# Patient Record
Sex: Female | Born: 1940
Health system: Southern US, Community
[De-identification: ages and names within clinical notes are randomized; demographics above are authoritative.]

## PROBLEM LIST (undated history)

## (undated) ENCOUNTER — Emergency Department: Disposition: A | Discharge status: Home / Self Care | Payer: PPO

## (undated) DIAGNOSIS — R42 Dizziness and giddiness: Secondary | ICD-10-CM

## (undated) DIAGNOSIS — I10 Essential (primary) hypertension: Secondary | ICD-10-CM

## (undated) DIAGNOSIS — I509 Heart failure, unspecified: Secondary | ICD-10-CM

## (undated) DIAGNOSIS — K259 Gastric ulcer, unspecified as acute or chronic, without hemorrhage or perforation: Secondary | ICD-10-CM

## (undated) DIAGNOSIS — K219 Gastro-esophageal reflux disease without esophagitis: Secondary | ICD-10-CM

## (undated) DIAGNOSIS — E7800 Pure hypercholesterolemia, unspecified: Secondary | ICD-10-CM

## (undated) DIAGNOSIS — Z973 Presence of spectacles and contact lenses: Secondary | ICD-10-CM

## (undated) DIAGNOSIS — M1990 Unspecified osteoarthritis, unspecified site: Secondary | ICD-10-CM

## (undated) DIAGNOSIS — Z87442 Personal history of urinary calculi: Secondary | ICD-10-CM

## (undated) DIAGNOSIS — F419 Anxiety disorder, unspecified: Secondary | ICD-10-CM

## (undated) DIAGNOSIS — Z8489 Family history of other specified conditions: Secondary | ICD-10-CM

## (undated) DIAGNOSIS — J449 Chronic obstructive pulmonary disease, unspecified: Secondary | ICD-10-CM

## (undated) DIAGNOSIS — R519 Headache, unspecified: Secondary | ICD-10-CM

## (undated) DIAGNOSIS — I779 Disorder of arteries and arterioles, unspecified: Secondary | ICD-10-CM

## (undated) DIAGNOSIS — C801 Malignant (primary) neoplasm, unspecified: Secondary | ICD-10-CM

## (undated) DIAGNOSIS — R51 Headache: Secondary | ICD-10-CM

## (undated) DIAGNOSIS — I739 Peripheral vascular disease, unspecified: Secondary | ICD-10-CM

## (undated) DIAGNOSIS — Z720 Tobacco use: Secondary | ICD-10-CM

## (undated) DIAGNOSIS — R0602 Shortness of breath: Secondary | ICD-10-CM

## (undated) DIAGNOSIS — I639 Cerebral infarction, unspecified: Secondary | ICD-10-CM

## (undated) DIAGNOSIS — H269 Unspecified cataract: Secondary | ICD-10-CM

## (undated) DIAGNOSIS — J189 Pneumonia, unspecified organism: Secondary | ICD-10-CM

## (undated) HISTORY — DX: Peripheral vascular disease, unspecified: I73.9

## (undated) HISTORY — DX: Tobacco use: Z72.0

## (undated) HISTORY — DX: Dizziness and giddiness: R42

## (undated) HISTORY — PX: BREAST REDUCTION SURGERY: SHX8

## (undated) HISTORY — PX: APPENDECTOMY: SHX54

## (undated) HISTORY — DX: Disorder of arteries and arterioles, unspecified: I77.9

## (undated) HISTORY — PX: TUBAL LIGATION: SHX77

## (undated) HISTORY — PX: SALIVARY GLAND SURGERY: SHX768

## (undated) HISTORY — DX: Chronic obstructive pulmonary disease, unspecified: J44.9

## (undated) HISTORY — PX: ABDOMINAL HYSTERECTOMY: SHX81

---

## 2011-11-19 ENCOUNTER — Telehealth: Payer: Self-pay | Admitting: Gastroenterology

## 2011-11-19 ENCOUNTER — Emergency Department (HOSPITAL_COMMUNITY)
Admission: EM | Admit: 2011-11-19 | Discharge: 2011-11-19 | Disposition: A | Discharge status: Home / Self Care | Payer: Medicare Other | Attending: Emergency Medicine | Admitting: Emergency Medicine

## 2011-11-19 ENCOUNTER — Encounter (HOSPITAL_COMMUNITY): Payer: Self-pay | Admitting: *Deleted

## 2011-11-19 DIAGNOSIS — R5383 Other fatigue: Secondary | ICD-10-CM

## 2011-11-19 DIAGNOSIS — G8929 Other chronic pain: Secondary | ICD-10-CM

## 2011-11-19 DIAGNOSIS — F17200 Nicotine dependence, unspecified, uncomplicated: Secondary | ICD-10-CM | POA: Insufficient documentation

## 2011-11-19 DIAGNOSIS — I10 Essential (primary) hypertension: Secondary | ICD-10-CM | POA: Insufficient documentation

## 2011-11-19 DIAGNOSIS — Z9089 Acquired absence of other organs: Secondary | ICD-10-CM | POA: Insufficient documentation

## 2011-11-19 DIAGNOSIS — E7800 Pure hypercholesterolemia, unspecified: Secondary | ICD-10-CM | POA: Insufficient documentation

## 2011-11-19 DIAGNOSIS — Z79899 Other long term (current) drug therapy: Secondary | ICD-10-CM | POA: Insufficient documentation

## 2011-11-19 DIAGNOSIS — R109 Unspecified abdominal pain: Secondary | ICD-10-CM | POA: Insufficient documentation

## 2011-11-19 DIAGNOSIS — E119 Type 2 diabetes mellitus without complications: Secondary | ICD-10-CM | POA: Insufficient documentation

## 2011-11-19 DIAGNOSIS — F411 Generalized anxiety disorder: Secondary | ICD-10-CM | POA: Insufficient documentation

## 2011-11-19 HISTORY — DX: Anxiety disorder, unspecified: F41.9

## 2011-11-19 HISTORY — DX: Gastric ulcer, unspecified as acute or chronic, without hemorrhage or perforation: K25.9

## 2011-11-19 HISTORY — DX: Pure hypercholesterolemia, unspecified: E78.00

## 2011-11-19 HISTORY — DX: Essential (primary) hypertension: I10

## 2011-11-19 LAB — COMPREHENSIVE METABOLIC PANEL
BUN: 15 mg/dL (ref 6–23)
CO2: 27 mEq/L (ref 19–32)
Calcium: 9.5 mg/dL (ref 8.4–10.5)
Creatinine, Ser: 0.54 mg/dL (ref 0.50–1.10)
GFR calc Af Amer: >90 mL/min (ref >90)
GFR calc non Af Amer: >90 mL/min (ref >90)
Glucose, Bld: 93 mg/dL (ref 70–99)
Total Protein: 6.9 g/dL (ref 6.0–8.3)

## 2011-11-19 LAB — CBC WITH DIFFERENTIAL/PLATELET
Eosinophils Absolute: 0.1 10*3/uL (ref 0.0–0.7)
Eosinophils Relative: 1 % (ref 0–5)
HCT: 31 % — ABNORMAL LOW (ref 36.0–46.0)
Lymphs Abs: 2.1 10*3/uL (ref 0.7–4.0)
MCH: 31.9 pg (ref 26.0–34.0)
MCV: 94.2 fL (ref 78.0–100.0)
Monocytes Absolute: 0.9 10*3/uL (ref 0.1–1.0)
Monocytes Relative: 7 % (ref 3–12)
Platelets: 429 10*3/uL — ABNORMAL HIGH (ref 150–400)
RBC: 3.29 MIL/uL — ABNORMAL LOW (ref 3.87–5.11)

## 2011-11-19 LAB — URINALYSIS, ROUTINE W REFLEX MICROSCOPIC
Bilirubin Urine: NEGATIVE
Glucose, UA: NEGATIVE mg/dL
Hgb urine dipstick: NEGATIVE
Specific Gravity, Urine: 1.018 (ref 1.005–1.030)
Urobilinogen, UA: 0.2 mg/dL (ref 0.0–1.0)

## 2011-11-19 LAB — OCCULT BLOOD, POC DEVICE: Fecal Occult Bld: NEGATIVE

## 2011-11-19 LAB — PROTIME-INR: Prothrombin Time: 12.7 seconds (ref 11.6–15.2)

## 2011-11-19 MED ORDER — SODIUM CHLORIDE 0.9 % IV SOLN
Freq: Once | INTRAVENOUS | Status: AC
  Administered 2011-11-19: 20:00:00 via INTRAVENOUS

## 2011-11-19 NOTE — ED Notes (Signed)
Pt sent from PCP for Hgb 8.9. Went there for weakness since last Tuesday. Has been having black stools, denies diarrhea or constipation. C/o abd pain, "which is nothing new." Denies n/v. Sts she has a Hx of HTN and her BP is much lower than usual. Sent for eval of active GI bleed and low hgb. Dr. Maurene Capes, GI will see if consult is needed.

## 2011-11-19 NOTE — ED Provider Notes (Signed)
History     CSN: [id number]  Arrival date & time 11/19/11  1747   First MD Initiated Contact with Patient 11/19/11 1957      Chief Complaint  Patient presents with  . GI Bleeding  . Abdominal Pain    (Consider location/radiation/quality/duration/timing/severity/associated sxs/prior treatment) HPI Comments: Patient states, that she went to her primary care physician today for weakness.  She reports, that she's had black, tarry stools for over a month.  She does have a history of GERD, and diabetes.  She has no change in her medications or activities.  She has no history of diverticular disease.  She's had no recent illnesses, including nausea, vomiting, diarrhea, fevers, dysuria.  Her primary care physician's office.  Her hemoglobin was reported, at 8.9, so she was sent to the emergency department for further evaluation  Patient is a 71 y.o. female presenting with abdominal pain.  Abdominal Pain The primary symptoms of the illness include abdominal pain. The primary symptoms of the illness do not include fever, nausea, diarrhea or dysuria. The onset of the illness was gradual. The problem has been gradually worsening.  Symptoms associated with the illness do not include chills, constipation or back pain. Significant associated medical issues include GERD.    Past Medical History  Diagnosis Date  . Hypertension   . Hypercholesteremia   . Diabetes mellitus   . Anxiety   . Stomach ulcer     Past Surgical History  Procedure Date  . Tubal ligation   . Abdominal hysterectomy   . Breast reduction surgery   . Appendectomy     No family history on file.  History  Substance Use Topics  . Smoking status: Current Everyday Smoker  . Smokeless tobacco: Not on file  . Alcohol Use: No    OB History    Grav Para Term Preterm Abortions TAB SAB Ect Mult Living                  Review of Systems  Constitutional: Negative for fever and chills.  Gastrointestinal: Positive for  abdominal pain and blood in stool. Negative for nausea, diarrhea, constipation, abdominal distention, anal bleeding and rectal pain.  Genitourinary: Negative for dysuria and flank pain.  Musculoskeletal: Negative for back pain.  Neurological: Positive for weakness. Negative for dizziness and headaches.    Allergies  Estrace  Home Medications   Current Outpatient Rx  Name Route Sig Dispense Refill  . AMLODIPINE BESYLATE 5 MG PO TABS Oral Take 5 mg by mouth daily.    . ATORVASTATIN CALCIUM 40 MG PO TABS Oral Take 40 mg by mouth daily.    [name] DIAZEPAM 5 MG PO TABS Oral Take 5 mg by mouth every 12 (twelve) hours as needed. Anxiety    . FENOFIBRATE MICRONIZED 134 MG PO CAPS Oral Take 134 mg by mouth daily before breakfast.    . HYDROCHLOROTHIAZIDE 25 MG PO TABS Oral Take 25 mg by mouth daily.    [name] LISINOPRIL 40 MG PO TABS Oral Take 40 mg by mouth daily.    [name] METFORMIN HCL 500 MG PO TABS Oral Take 500 mg by mouth 2 (two) times daily with a meal.    . METOCLOPRAMIDE HCL 10 MG PO TABS Oral Take 10 mg by mouth 4 (four) times daily.    [name] OMEPRAZOLE 20 MG PO CPDR Oral Take 20 mg by mouth daily.      BP 111/56  Pulse 101  Temp 98.6 F (37 C) (Oral)  Resp 20  Ht 5' 4.5" (1.638 m)  Wt 155 lb (70.308 kg)  BMI 26.19 kg/m2  SpO2 99%  Physical Exam  Constitutional: She appears well-developed.  HENT:  Head: Normocephalic.  Neck: Normal range of motion.  Cardiovascular: Tachycardia present.   Pulmonary/Chest: Effort normal. No respiratory distress. She exhibits no tenderness.  Abdominal: Soft. Bowel sounds are normal. She exhibits no distension. There is no tenderness.  Musculoskeletal: Normal range of motion.  Neurological: She is alert.  Skin: Skin is warm and dry. No pallor.    ED Course  Procedures (including critical care time)  Labs Reviewed  CBC WITH DIFFERENTIAL - Abnormal; Notable for the following:    WBC 13.2 (*)     RBC 3.29 (*)     Hemoglobin 10.5 (*)     HCT 31.0 (*)       Platelets 429 (*)     Neutro Abs 10.1 (*)     All other components within normal limits  COMPREHENSIVE METABOLIC PANEL - Abnormal; Notable for the following:    Total Bilirubin 0.1 (*)     All other components within normal limits  URINALYSIS, ROUTINE W REFLEX MICROSCOPIC  PROTIME-INR  OCCULT BLOOD, POC DEVICE  VITAMIN B12  IRON AND TIBC  FERRITIN   No results found.   1. Fatigue   2. Chronic abdominal pain       MDM  Consulted with Dr. Olevia Oliver, from gl[facility]ellar.  GI review of patient's labs symptoms.  She has had additional labs be drawn to evaluate for anemia and refer patient back to her primary care physician for further evaluation of her fatigue.         Caitlin Balding, NP 11/19/11 2205

## 2011-11-19 NOTE — Telephone Encounter (Signed)
Spoke with Dr. Deatra Ina as he is doc of the day. Per Dr. Deatra Ina for active GI bleed pt needs to go to the ER. Spoke with Margarita Grizzle and she will notify the pt.

## 2011-11-19 NOTE — ED Provider Notes (Signed)
71 year old female has been having black bowel movements for the last several days. She went to see her PCP where hemoglobin was noted to be 8.9 and she was directed come to the ED. Here, hemoglobin is over 10 and she is hemodynamically stable. She does not need emergent hospitalization but does need evaluation by GI and appointments are to be made with a gastroenterologist to be evaluated tomorrow.  Delora Fuel, MD 04/59/97 [id number]

## 2011-11-20 LAB — VITAMIN B12: Vitamin B-12: 846 pg/mL (ref 211–911)

## 2011-11-20 NOTE — ED Provider Notes (Signed)
Medical screening examination/treatment/procedure(s) were performed by non-physician practitioner and as supervising physician I was immediately available for consultation/collaboration.   Delora Fuel, MD 88/91/69 [id number]

## 2012-12-03 ENCOUNTER — Encounter (INDEPENDENT_AMBULATORY_CARE_PROVIDER_SITE_OTHER): Payer: Medicare Other | Admitting: *Deleted

## 2012-12-03 DIAGNOSIS — R29898 Other symptoms and signs involving the musculoskeletal system: Secondary | ICD-10-CM

## 2012-12-03 DIAGNOSIS — M79609 Pain in unspecified limb: Secondary | ICD-10-CM

## 2012-12-03 DIAGNOSIS — R0989 Other specified symptoms and signs involving the circulatory and respiratory systems: Secondary | ICD-10-CM

## 2013-01-07 ENCOUNTER — Ambulatory Visit: Payer: Medicare Other | Admitting: Cardiovascular Disease

## 2013-01-09 ENCOUNTER — Ambulatory Visit: Payer: Medicare Other | Admitting: Cardiovascular Disease

## 2013-01-20 ENCOUNTER — Encounter: Payer: Self-pay | Admitting: Cardiovascular Disease

## 2013-01-20 ENCOUNTER — Ambulatory Visit (INDEPENDENT_AMBULATORY_CARE_PROVIDER_SITE_OTHER): Payer: Medicare Other | Admitting: Cardiovascular Disease

## 2013-01-20 VITALS — BP 138/70 | HR 96 | Resp 32 | Ht 64.5 in | Wt 161.9 lb

## 2013-01-20 DIAGNOSIS — E119 Type 2 diabetes mellitus without complications: Secondary | ICD-10-CM | POA: Insufficient documentation

## 2013-01-20 DIAGNOSIS — I10 Essential (primary) hypertension: Secondary | ICD-10-CM

## 2013-01-20 DIAGNOSIS — E1159 Type 2 diabetes mellitus with other circulatory complications: Secondary | ICD-10-CM | POA: Insufficient documentation

## 2013-01-20 DIAGNOSIS — I739 Peripheral vascular disease, unspecified: Secondary | ICD-10-CM

## 2013-01-20 DIAGNOSIS — E785 Hyperlipidemia, unspecified: Secondary | ICD-10-CM | POA: Insufficient documentation

## 2013-01-20 DIAGNOSIS — Z01818 Encounter for other preprocedural examination: Secondary | ICD-10-CM

## 2013-01-20 NOTE — Assessment & Plan Note (Signed)
Patient is a history of claudication bilaterally when walking up an incline. Assistant on for 3 years. [facility] a right ABI of 0.92 and a left of 0.79. I'm going to get velocity mapping of her lower extremities and arrange for her to undergo abdominal aortography with bifemoral runoff for potential endovascular treatment of lifestyle limiting claudication

## 2013-01-20 NOTE — Progress Notes (Signed)
01/20/2013 Caitlin Oliver   1940/05/05  [id number]  Primary Physician Caitlin Seller, MD Primary Cardiologist: Lorretta Harp MD Caitlin Oliver   HPI:  Caitlin Oliver is a 72 year old mildly overweight widowed Caucasian female no children (son died 26 years ago) referred by Caitlin Niemann  PA-C at [facility] for evaluation and treatment of lifestyle limiting claudication. Risk factors included hypertension, hyperlipidemia and type 2 diabetes. She has a 75-pack-year history of tobacco abuse smoking one and a half packs a day. There's no family history of heart disease. She's never had a heart for stroke. She does complain of dyspnea but denies chest pain. She complains of bilateral low prepublication walking up an incline it is lifestyle limiting. Dopplers performed at being a vascular specialist Job mildly decreased ABIs on the right and moderate on the left. Her right is actually more symptomatic.   Current Outpatient Prescriptions  Medication Sig Dispense Refill  . amLODipine (NORVASC) 5 MG tablet Take 5 mg by mouth daily.      [name] atorvastatin (LIPITOR) 40 MG tablet Take 40 mg by mouth daily.      . diazepam (VALIUM) 5 MG tablet Take 5 mg by mouth every 12 (twelve) hours as needed. Anxiety      . fenofibrate micronized (LOFIBRA) 134 MG capsule Take 134 mg by mouth daily before breakfast.      . hydrochlorothiazide (HYDRODIURIL) 25 MG tablet Take 25 mg by mouth daily.      [name] lisinopril (PRINIVIL,ZESTRIL) 40 MG tablet Take 40 mg by mouth daily.      . metFORMIN (GLUCOPHAGE) 500 MG tablet Take 500 mg by mouth daily.       . metoCLOPramide (REGLAN) 10 MG tablet Take 10 mg by mouth 4 (four) times daily.      [name] omeprazole (PRILOSEC) 20 MG capsule Take 20 mg by mouth daily.       No current facility-administered medications for this visit.    Allergies  Allergen Reactions  . Estrace [Estradiol] Swelling    History   Social History  . Marital  Status: Widowed    Spouse Name: N/A    Number of Children: N/A  . Years of Education: N/A   Occupational History  . Not on file.   Social History Main Topics  . Smoking status: Current Every Day Smoker -- 1.50 packs/day for 56 years    Types: Cigarettes  . Smokeless tobacco: Never Used  . Alcohol Use: No  . Drug Use: No  . Sexual Activity: Not on file   Other Topics Concern  . Not on file   Social History Narrative  . No narrative on file     Review of Systems: General: negative for chills, fever, night sweats or weight changes.  Cardiovascular: negative for chest pain, dyspnea on exertion, edema, orthopnea, palpitations, paroxysmal nocturnal dyspnea or shortness of breath Dermatological: negative for rash Respiratory: negative for cough or wheezing Urologic: negative for hematuria Abdominal: negative for nausea, vomiting, diarrhea, bright red blood per rectum, melena, or hematemesis Neurologic: negative for visual changes, syncope, or dizziness All other systems reviewed and are otherwise negative except as noted above.    Blood pressure 138/70, pulse 96, resp. rate 32, height 5' 4.5" (1.638 m), weight 161 lb 14.4 oz (73.437 kg).  General appearance: alert and no distress Neck: no adenopathy, no JVD, supple, symmetrical, trachea midline, thyroid not enlarged, symmetric, no tenderness/mass/nodules and ssoft bilateral carotid bruits Lungs: clear to auscultation  bilaterally Heart: regular rate and rhythm, S1, S2 normal, no murmur, click, rub or gallop Extremities: extremities normal, atraumatic, no cyanosis or edema and 1+ pedal pulses bilaterally  EKG sinus rhythm at 96 without ST or T wave changes  ASSESSMENT AND PLAN:   Essential hypertension Well-controlled on appropriate medications  Hyperlipidemia On statin therapy followed by her PCP  Claudication Patient is a history of claudication bilaterally when walking up an incline. Assistant on for 3 years. She had  [facility] a right ABI of 0.92 and a left of 0.79. I'm going to get velocity mapping of her lower extremities and arrange for her to undergo abdominal aortography with bifemoral runoff for potential endovascular treatment of lifestyle limiting claudication      Lorretta Harp MD [facility], [facility] 01/20/2013 2:35 PM

## 2013-01-20 NOTE — Assessment & Plan Note (Signed)
On statin therapy followed by her PCP 

## 2013-01-20 NOTE — Patient Instructions (Addendum)
Your physician has requested that you have a peripheral vascular angiogram. This exam is performed at the hospital. During this exam IV contrast is used to look at arterial blood flow. Please review the information sheet given for details.  Your physician has requested that you have a carotid duplex. This test is an ultrasound of the carotid arteries in your neck. It looks at blood flow through these arteries that supply the brain with blood. Allow one hour for this exam. There are no restrictions or special instructions.  Your physician has requested that you have a lower extremity arterial exercise duplex. During this test, exercise and ultrasound are used to evaluate arterial blood flow in the legs. Allow one hour for this exam. There are no restrictions or special instructions.  Your physician has requested that you have a lexiscan myoview. For further information please visit [email or web address]. Please follow instruction sheet, as given.  Your physician recommends that you return for lab work within 7 days of the angiogram. This date will follow the other testing.  A chest x-ray takes a picture of the organs and structures inside the chest, including the heart, lungs, and blood vessels. This test can show several things, including, whether the heart is enlarges; whether fluid is building up in the lungs; and whether pacemaker / defibrillator leads are still in place.

## 2013-01-20 NOTE — Assessment & Plan Note (Signed)
Well-controlled on appropriate medications

## 2013-01-21 ENCOUNTER — Encounter: Payer: Self-pay | Admitting: Cardiovascular Disease

## 2013-01-27 ENCOUNTER — Other Ambulatory Visit: Payer: Self-pay | Admitting: *Deleted

## 2013-01-27 DIAGNOSIS — Z01818 Encounter for other preprocedural examination: Secondary | ICD-10-CM

## 2013-01-29 ENCOUNTER — Ambulatory Visit (HOSPITAL_COMMUNITY)
Admission: RE | Admit: 2013-01-29 | Discharge: 2013-01-29 | Disposition: A | Discharge status: Home / Self Care | Payer: Medicare Other | Source: Ambulatory Visit | Attending: Internal Medicine | Admitting: Internal Medicine

## 2013-01-29 DIAGNOSIS — Z01810 Encounter for preprocedural cardiovascular examination: Secondary | ICD-10-CM | POA: Insufficient documentation

## 2013-01-29 DIAGNOSIS — Z01818 Encounter for other preprocedural examination: Secondary | ICD-10-CM | POA: Insufficient documentation

## 2013-01-29 DIAGNOSIS — I739 Peripheral vascular disease, unspecified: Secondary | ICD-10-CM

## 2013-01-29 MED ORDER — AMINOPHYLLINE 25 MG/ML IV SOLN
125.0000 mg | Freq: Once | INTRAVENOUS | Status: AC
  Administered 2013-01-29: 125 mg via INTRAVENOUS

## 2013-01-29 MED ORDER — REGADENOSON 0.4 MG/5ML IV SOLN
0.4000 mg | Freq: Once | INTRAVENOUS | Status: AC
  Administered 2013-01-29: 0.4 mg via INTRAVENOUS

## 2013-01-29 MED ORDER — TECHNETIUM TC 99M SESTAMIBI GENERIC - CARDIOLITE
30.0000 | Freq: Once | INTRAVENOUS | Status: AC | PRN
  Administered 2013-01-29: 30 via INTRAVENOUS

## 2013-01-29 MED ORDER — TECHNETIUM TC 99M SESTAMIBI GENERIC - CARDIOLITE
10.0000 | Freq: Once | INTRAVENOUS | Status: AC | PRN
  Administered 2013-01-29: 10 via INTRAVENOUS

## 2013-01-29 NOTE — Procedures (Addendum)
[facility] [address] [place] [address] [phone]  Cardiology Nuclear Med Study  Caitlin Oliver is a 72 y.o. female     MRN : [id number]     DOB: Feb 05, 1941  Procedure Date: 01/29/2013  Nuclear Med Background Indication for Stress Test:  Surgical Clearance History:  COPD Cardiac Risk Factors: Hypertension, Lipids, NIDDM, Overweight, PVD and Smoker  Symptoms:  Dizziness, DOE, Fatigue and Light-Headedness   Nuclear Pre-Procedure Caffeine/Decaff Intake:  12:00am NPO After: 10am   IV Site: R Forearm  IV 0.9% NS with Angio Cath:  22g  Chest Size (in):  n/a IV Started by: Azucena Cecil, RN  Height: 5' 4.5" (1.638 m)  Cup Size: C  BMI:  Body mass index is 27.22 kg/(m^2). Weight:  161 lb (73.029 kg)   Tech Comments:  n/a    Nuclear Med Study 1 or 2 day study: 1 day  Stress Test Type:  [place] Provider:  Hyman Hopes   Resting Radionuclide: Technetium 43mSestamibi  Resting Radionuclide Dose: 11.0 mCi   Stress Radionuclide:  Technetium 963mestamibi  Stress Radionuclide Dose: 30.3 mCi           Stress Protocol Rest HR: 114 Stress HR: 122  Rest BP: 132/93 Stress BP: 129/96  Exercise Time (min): n/a METS: n/a   Predicted Max HR: 148 bpm % Max HR: 82.43 bpm Rate Pressure Product: 16104  Dose of Adenosine (mg):  n/a Dose of Lexiscan: 0.4 mg  Dose of Atropine (mg): n/a Dose of Dobutamine: n/a mcg/kg/min (at max HR)  Stress Test Technologist: TeLeane ParaCCT Nuclear Technologist: RoOtho PerlCNMT   Rest Procedure:  Myocardial perfusion imaging was performed at rest 45 minutes following the intravenous administration of Technetium 9978mstamibi. Stress Procedure:  The patient received IV Lexiscan 0.4 mg over 15-seconds.  Technetium 52m58mtamibi injected at 30-seconds.  The patient had SOB prior to study but developed marked SOB and extremity aching; arms and legs; 125 mg IV  Aminophylline was administered with resolution of symptoms.  There were no significant changes with Lexiscan.  Quantitative spect images were obtained after a 45 minute delay.  Transient Ischemic Dilatation (Normal <1.22):  1.05 Lung/Heart Ratio (Normal <0.45):  0.26 QGS EDV:  n/aml QGS ESV: n/a ml LV Ejection Fraction: Study not gated  Rest ECG: NSR - Normal EKG  Stress ECG: No significant change from baseline ECG and There are scattered PACs.  QPS Raw Data Images:  There is interference from nuclear activity from structures below the diaphragm. This does not affect the ability to read the study. Stress Images:  Normal homogeneous uptake in all areas of the myocardium. Rest Images:  Normal homogeneous uptake in all areas of the myocardium. Subtraction (SDS):  No evidence of ischemia.  Impression Exercise Capacity:  Lexiscan with no exercise. BP Response:  Normal blood pressure response. Clinical Symptoms:  No significant symptoms noted. ECG Impression:  There are scattered PACs. Comparison with Prior Nuclear Study: No previous nuclear study performed  Overall Impression:  Normal stress nuclear study.  LV Wall Motion:  Non-gated due to ectopy.  KennPixie Casino, FACCProvidence Seward Medical Centerrd Certified in Nuclear Cardiology Attending Cardiologist [facility] [place]  01/29/2013 4:51 PM

## 2013-02-04 ENCOUNTER — Ambulatory Visit (HOSPITAL_COMMUNITY)
Admission: RE | Admit: 2013-02-04 | Discharge: 2013-02-04 | Disposition: A | Discharge status: Home / Self Care | Payer: Medicare Other | Source: Ambulatory Visit | Attending: Cardiovascular Disease | Admitting: Cardiovascular Disease

## 2013-02-04 DIAGNOSIS — I739 Peripheral vascular disease, unspecified: Secondary | ICD-10-CM | POA: Insufficient documentation

## 2013-02-04 NOTE — Progress Notes (Signed)
Lower Extremity Arterial Duplex Completed. °Brianna L Mazza,RVT °

## 2013-02-09 ENCOUNTER — Telehealth (HOSPITAL_COMMUNITY): Payer: Self-pay | Admitting: *Deleted

## 2013-02-09 NOTE — Telephone Encounter (Signed)
Pt would like results of her stress test and LEA... Please call.

## 2013-02-10 NOTE — Telephone Encounter (Signed)
Reviewed results with patient.

## 2013-02-11 ENCOUNTER — Encounter (HOSPITAL_COMMUNITY): Payer: Medicare Other

## 2013-02-12 ENCOUNTER — Other Ambulatory Visit (HOSPITAL_COMMUNITY): Payer: Self-pay | Admitting: Cardiovascular Disease

## 2013-02-12 ENCOUNTER — Ambulatory Visit (HOSPITAL_COMMUNITY)
Admission: RE | Admit: 2013-02-12 | Discharge: 2013-02-12 | Disposition: A | Discharge status: Home / Self Care | Payer: Medicare Other | Source: Ambulatory Visit | Attending: Cardiovascular Disease | Admitting: Cardiovascular Disease

## 2013-02-12 DIAGNOSIS — I10 Essential (primary) hypertension: Secondary | ICD-10-CM

## 2013-02-12 NOTE — Progress Notes (Signed)
Carotid Duplex Completed. °Brianna L Mazza,RVT °

## 2013-02-17 ENCOUNTER — Other Ambulatory Visit: Payer: Self-pay | Admitting: *Deleted

## 2013-02-17 ENCOUNTER — Encounter (HOSPITAL_COMMUNITY): Payer: Self-pay | Admitting: Pharmacy Technician

## 2013-02-17 DIAGNOSIS — Z01818 Encounter for other preprocedural examination: Secondary | ICD-10-CM

## 2013-02-18 ENCOUNTER — Ambulatory Visit
Admission: RE | Admit: 2013-02-18 | Discharge: 2013-02-18 | Disposition: A | Discharge status: Home / Self Care | Payer: Medicare Other | Source: Ambulatory Visit | Attending: Cardiovascular Disease | Admitting: Cardiovascular Disease

## 2013-02-18 DIAGNOSIS — Z01818 Encounter for other preprocedural examination: Secondary | ICD-10-CM

## 2013-02-18 LAB — COMPREHENSIVE METABOLIC PANEL
ALT: 14 U/L (ref 0–35)
Albumin: 4.4 g/dL (ref 3.5–5.2)
Alkaline Phosphatase: 85 U/L (ref 39–117)
CO2: 31 mEq/L (ref 19–32)
Glucose, Bld: 104 mg/dL — ABNORMAL HIGH (ref 70–99)
Potassium: 4.3 mEq/L (ref 3.5–5.3)
Sodium: 135 mEq/L (ref 135–145)
Total Bilirubin: 0.3 mg/dL (ref 0.3–1.2)
Total Protein: 6.9 g/dL (ref 6.0–8.3)

## 2013-02-18 LAB — CBC
Hemoglobin: 17.2 g/dL — ABNORMAL HIGH (ref 12.0–15.0)
MCH: 32 pg (ref 26.0–34.0)
MCHC: 35.2 g/dL (ref 30.0–36.0)
Platelets: 375 10*3/uL (ref 150–400)
RBC: 5.37 MIL/uL — ABNORMAL HIGH (ref 3.87–5.11)

## 2013-02-19 LAB — APTT: aPTT: 31 seconds (ref 24–37)

## 2013-02-19 LAB — PROTIME-INR
INR: 0.93 (ref <1.50)
Prothrombin Time: 12.5 seconds (ref 11.6–15.2)

## 2013-02-22 ENCOUNTER — Encounter: Payer: Self-pay | Admitting: *Deleted

## 2013-02-24 ENCOUNTER — Other Ambulatory Visit: Payer: Self-pay | Admitting: Cardiovascular Disease

## 2013-02-25 ENCOUNTER — Encounter (HOSPITAL_COMMUNITY)
Admission: RE | Disposition: A | Discharge status: Home / Self Care | Payer: Self-pay | Source: Ambulatory Visit | Attending: Cardiovascular Disease

## 2013-02-25 ENCOUNTER — Ambulatory Visit (HOSPITAL_COMMUNITY)
Admission: RE | Admit: 2013-02-25 | Discharge: 2013-02-25 | Disposition: A | Discharge status: Home / Self Care | Payer: Medicare Other | Source: Ambulatory Visit | Attending: Cardiovascular Disease | Admitting: Cardiovascular Disease

## 2013-02-25 DIAGNOSIS — I658 Occlusion and stenosis of other precerebral arteries: Secondary | ICD-10-CM | POA: Insufficient documentation

## 2013-02-25 DIAGNOSIS — I152 Hypertension secondary to endocrine disorders: Secondary | ICD-10-CM | POA: Diagnosis present

## 2013-02-25 DIAGNOSIS — E785 Hyperlipidemia, unspecified: Secondary | ICD-10-CM | POA: Insufficient documentation

## 2013-02-25 DIAGNOSIS — I739 Peripheral vascular disease, unspecified: Secondary | ICD-10-CM | POA: Diagnosis present

## 2013-02-25 DIAGNOSIS — F17200 Nicotine dependence, unspecified, uncomplicated: Secondary | ICD-10-CM | POA: Insufficient documentation

## 2013-02-25 DIAGNOSIS — I708 Atherosclerosis of other arteries: Secondary | ICD-10-CM | POA: Insufficient documentation

## 2013-02-25 DIAGNOSIS — I70219 Atherosclerosis of native arteries of extremities with intermittent claudication, unspecified extremity: Secondary | ICD-10-CM | POA: Insufficient documentation

## 2013-02-25 DIAGNOSIS — I6529 Occlusion and stenosis of unspecified carotid artery: Secondary | ICD-10-CM | POA: Insufficient documentation

## 2013-02-25 DIAGNOSIS — E7800 Pure hypercholesterolemia, unspecified: Secondary | ICD-10-CM | POA: Insufficient documentation

## 2013-02-25 DIAGNOSIS — Z7982 Long term (current) use of aspirin: Secondary | ICD-10-CM | POA: Insufficient documentation

## 2013-02-25 DIAGNOSIS — I10 Essential (primary) hypertension: Secondary | ICD-10-CM | POA: Insufficient documentation

## 2013-02-25 DIAGNOSIS — E1159 Type 2 diabetes mellitus with other circulatory complications: Secondary | ICD-10-CM | POA: Diagnosis present

## 2013-02-25 DIAGNOSIS — E119 Type 2 diabetes mellitus without complications: Secondary | ICD-10-CM | POA: Diagnosis present

## 2013-02-25 DIAGNOSIS — Z01818 Encounter for other preprocedural examination: Secondary | ICD-10-CM

## 2013-02-25 DIAGNOSIS — E663 Overweight: Secondary | ICD-10-CM | POA: Insufficient documentation

## 2013-02-25 DIAGNOSIS — E1169 Type 2 diabetes mellitus with other specified complication: Secondary | ICD-10-CM | POA: Diagnosis present

## 2013-02-25 HISTORY — PX: CAROTID ANGIOGRAM: SHX5504

## 2013-02-25 HISTORY — PX: LOWER EXTREMITY ANGIOGRAM: SHX5508

## 2013-02-25 LAB — GLUCOSE, CAPILLARY
Glucose-Capillary: 122 mg/dL — ABNORMAL HIGH (ref 70–99)
Glucose-Capillary: 108 mg/dL — ABNORMAL HIGH (ref 70–99)

## 2013-02-25 SURGERY — ANGIOGRAM, LOWER EXTREMITY
Anesthesia: LOCAL

## 2013-02-25 MED ORDER — SODIUM CHLORIDE 0.9 % IV SOLN: INTRAVENOUS | Status: AC

## 2013-02-25 MED ORDER — HEPARIN (PORCINE) IN NACL 2-0.9 UNIT/ML-% IJ SOLN
INTRAMUSCULAR | Status: AC
  Filled 2013-02-25: qty 1000

## 2013-02-25 MED ORDER — SODIUM CHLORIDE 0.9 % IJ SOLN: 3.0000 mL | INTRAMUSCULAR | Status: DC | PRN

## 2013-02-25 MED ORDER — ASPIRIN 81 MG PO CHEW
81.0000 mg | CHEWABLE_TABLET | ORAL | Status: AC
  Administered 2013-02-25: 81 mg via ORAL
  Filled 2013-02-25: qty 1

## 2013-02-25 MED ORDER — SODIUM CHLORIDE 0.9 % IV SOLN
INTRAVENOUS | Status: DC
  Administered 2013-02-25: 1000 mL via INTRAVENOUS

## 2013-02-25 MED ORDER — ONDANSETRON HCL 4 MG/2ML IJ SOLN: 4.0000 mg | Freq: Four times a day (QID) | INTRAMUSCULAR | Status: DC | PRN

## 2013-02-25 MED ORDER — GUAIFENESIN-CODEINE 100-10 MG/5ML PO SOLN
ORAL | Status: AC
  Filled 2013-02-25: qty 5

## 2013-02-25 MED ORDER — ACETAMINOPHEN 325 MG PO TABS: 650.0000 mg | ORAL_TABLET | ORAL | Status: DC | PRN

## 2013-02-25 MED ORDER — LIDOCAINE HCL (PF) 1 % IJ SOLN
INTRAMUSCULAR | Status: AC
  Filled 2013-02-25: qty 30

## 2013-02-25 NOTE — H&P (Signed)
Chief Complaint: Claudication  HPI:  [name] is a 72 year old mildly overweight widowed Caucasian female no children (son died 4 years ago) referred by Emmie Niemann PA-C at [facility] for evaluation and treatment of lifestyle limiting claudication. Risk factors included hypertension, hyperlipidemia and type 2 diabetes. She has a 75-pack-year history of tobacco abuse smoking one and a half packs a day. There's no family history of heart disease. She's never had a heart for stroke. She does complain of dyspnea but denies chest pain. She complains of bilateral low prepublication walking up an incline it is lifestyle limiting. Dopplers performed at being a vascular specialist Job mildly decreased ABIs on the right and moderate on the left. Her right is actually more symptomatic.  Past Medical History  Diagnosis Date  . Hypertension   . Hypercholesteremia   . Diabetes mellitus   . Anxiety   . Stomach ulcer   . Claudication   . Tobacco abuse     Past Surgical History  Procedure Laterality Date  . Tubal ligation    . Abdominal hysterectomy    . Breast reduction surgery    . Appendectomy      Family History  Problem Relation Age of Onset  . Stroke Mother   . Hypertension Mother   . Kidney failure Father    Social History:  reports that she has been smoking Cigarettes.  She has a 84 pack-year smoking history. She has never used smokeless tobacco. She reports that she does not drink alcohol or use illicit drugs.  Allergies:  Allergies  Allergen Reactions  . Estrace [Estradiol] Swelling    Medications Prior to Admission  Medication Sig Dispense Refill  . amLODipine (NORVASC) 5 MG tablet Take 5 mg by mouth daily.      [name] aspirin EC 81 MG tablet Take 81 mg by mouth daily.      . diazepam (VALIUM) 5 MG tablet Take 5 mg by mouth every 12 (twelve) hours as needed. Anxiety      . fenofibrate micronized (LOFIBRA) 134 MG capsule Take 134 mg by mouth  daily before breakfast.      . fish oil-omega-3 fatty acids 1000 MG capsule Take 1 g by mouth daily.      . hydrochlorothiazide (MICROZIDE) 12.5 MG capsule Take 12.5 mg by mouth daily.      [name] lisinopril (PRINIVIL,ZESTRIL) 20 MG tablet Take 20 mg by mouth daily.      . metFORMIN (GLUCOPHAGE) 500 MG tablet Take 500 mg by mouth daily.       . metoCLOPramide (REGLAN) 10 MG tablet Take 10 mg by mouth 4 (four) times daily.      [name] omeprazole (PRILOSEC) 20 MG capsule Take 20 mg by mouth 2 (two) times daily before a meal.       . pravastatin (PRAVACHOL) 40 MG tablet Take 40 mg by mouth daily.        Results for orders placed during the hospital encounter of 02/25/13 (from the past 48 hour(s))  GLUCOSE, CAPILLARY     Status: Abnormal   Collection Time    02/25/13  7:43 AM      Result Value Range   Glucose-Capillary 122 (*) 70 - 99 mg/dL   No results found.  Review of Systems  Cardiovascular: Positive for claudication. Negative for chest pain.  All other systems reviewed and are negative.    Pulse 106, temperature 98.4 F (36.9 C), temperature source Oral, resp. rate 20,  height 5' 4.5" (1.638 m), weight 161 lb (73.029 kg), SpO2 100.00%. Physical Exam  Constitutional: She is oriented to person, place, and time. She appears well-developed and well-nourished. No distress.  Cardiovascular: Normal rate, regular rhythm, normal heart sounds and intact distal pulses.   Pulses:      Radial pulses are 2+ on the right side, and 2+ on the left side.       Dorsalis pedis pulses are 0 on the right side, and 1+ on the left side.  Respiratory: Effort normal and breath sounds normal. No respiratory distress. She has no wheezes. She has no rales.  Musculoskeletal: She exhibits no edema.  Neurological: She is alert and oriented to person, place, and time.  Skin: Skin is warm and dry. She is not diaphoretic.  Psychiatric: She has a normal mood and affect. Her behavior is normal.      Assessment/Plan Principal Problem:   Claudication Active Problems:   Essential hypertension   Type 2 diabetes mellitus   Hyperlipidemia  Plan: Plan for PV [place] with Dr. Gwenlyn Found.   Lyda Jester 02/25/2013, 9:56 AM   Agree with note written by Ellen Henri  [facility]  For PV angio. No change in H & P  Courtney Bellizzi J 02/25/2013 10:33 AM

## 2013-02-25 NOTE — Progress Notes (Signed)
Pt voided X .3. Discharge instruction given per MD order.  Pt and CG able to verbalize understanding.  Pt to car via wheelchair.  Pt denies any discomfort at this time.

## 2013-02-25 NOTE — Progress Notes (Signed)
Pt received form cath procedure alert and able tolerate food and fluid.  Pt denise any discomfort at this time.

## 2013-02-25 NOTE — Interval H&P Note (Signed)
History and Physical Interval Note:  02/25/2013 10:33 AM  Caitlin Oliver  has presented today for surgery, with the diagnosis of Claudication  The various methods of treatment have been discussed with the patient and family. After consideration of risks, benefits and other options for treatment, the patient has consented to  Procedure(s): LOWER EXTREMITY ANGIOGRAM (N/A) CAROTID ANGIOGRAM (N/A) as a surgical intervention .  The patient's history has been reviewed, patient examined, no change in status, stable for surgery.  I have reviewed the patient's chart and labs.  Questions were answered to the patient's satisfaction.     Lorretta Harp

## 2013-02-25 NOTE — CV Procedure (Signed)
Caitlin Oliver is a 72 y.o. female    [id number] LOCATION:  FACILITY: [facility]  PHYSICIAN: Quay Burow, M.D. 11-30-1940   DATE OF PROCEDURE:  02/25/2013  DATE OF DISCHARGE:     PV Angiogram/Intervention    History obtained from chart review.Caitlin Oliver is a 72 year old mildly overweight widowed Caucasian female no children (son died 104 years ago) referred by Emmie Niemann PA-C at [facility] for evaluation and treatment of lifestyle limiting claudication. Risk factors included hypertension, hyperlipidemia and type 2 diabetes. She has a 75-pack-year history of tobacco abuse smoking one and a half packs a day. There's no family history of heart disease. She's never had a heart for stroke. She does complain of dyspnea but denies chest pain. She complains of bilateral low prepublication walking up an incline it is lifestyle limiting. Dopplers performed at being a vascular specialist Job mildly decreased ABIs on the right and moderate on the left. Her right is actually more symptomatic.    PROCEDURE DESCRIPTION:   The patient was brought to the second floor [facility] cath lab in the postabsorptive state. She was not premedicated . Her right groinwas prepped and shaved in usual sterile fashion. Xylocaine 1% was used  for local anesthesia. A 5 French sheath was inserted into the right common femoral artery using standard Seldinger technique. A 5 French pigtail catheter was used for aortic arch angiography, distal abdominal aortography and bifemoral runoff using bolus chase digital subtraction step table technique Visipaque dye was used for the entirety of the case. Retrograde aortic pressure was monitored during the case.there was a gradient measured of 70 mm mercury across the right common iliac artery. Total contrast administered to the patient was 226 cc.  HEMODYNAMICS:    AO SYSTOLIC/AO DIASTOLIC: 629/52   Angiographic Data:   1: Arch aortogram-type  one arch with fluoroscopic calcification  2: Right carotid artery-95-99% calcified eccentric proximal right internal carotid artery stenosis  3: Left carotid artery-95-99% calcified proximal left internal carotid artery stenosis  4: Midstream abdominal aortogram-the renal arteries are widely patent. The infrarenal abdominal aorta was free of significant atherosclerotic changes  5: Left lower extremity-95% ostial/proximal calcified left common iliac artery stenosis with three-vessel runoff below the knee  6: Right lower extremity-95% calcified ostial right common iliac artery stenosis, 90% eccentric right common femoral artery stenosis with three-vessel runoff below the knee  IMPRESSION:Caitlin Oliver has high-grade bilateral internal carotid artery stenosis which will need surgical revascularization. I reviewed her in transfer Dr. Annamarie Major , who agrees and will see her in the office for evaluation and consultation.she has a negative Myoview stress test and is cleared for her vascular surgical procedure at low cardiovascular risk. She also has calcified ostial bilateral iliac artery stenosis best treated with diamondback orbital rotational atherectomy, PTA and stenting. She has a high-grade right common femoral artery calcified lesion as well. The sheath was removed and pressure was held on the groin to achieve hemostasis. The patient left the Cath Lab in stable condition. She will be hydrated, discharged home and will see me back in the office.     Lorretta Harp MD, [facility] 02/25/2013 11:24 AM

## 2013-02-26 ENCOUNTER — Telehealth: Payer: Self-pay | Admitting: Surgery

## 2013-02-26 NOTE — Telephone Encounter (Addendum)
Message copied by Gena Fray on Thu Feb 26, 2013  2:13 PM ------      Message from: Alfonso Patten      Created: Wed Feb 25, 2013 11:31 AM       Dr Ardath Sax would like to see this pt for Bil ICA stenosis per Dr Gwenlyn Found either this coming Monday 11/17 or the next 11/24.      Thanks      Bethena Roys ------  02/26/13: spoke with pt, dpm

## 2013-02-27 ENCOUNTER — Ambulatory Visit (INDEPENDENT_AMBULATORY_CARE_PROVIDER_SITE_OTHER): Payer: Medicare Other | Admitting: Cardiovascular Disease

## 2013-02-27 ENCOUNTER — Encounter: Payer: Self-pay | Admitting: Surgery

## 2013-02-27 ENCOUNTER — Encounter: Payer: Self-pay | Admitting: Cardiovascular Disease

## 2013-02-27 VITALS — BP 150/64 | HR 96 | Ht 64.5 in | Wt 165.3 lb

## 2013-02-27 DIAGNOSIS — F17200 Nicotine dependence, unspecified, uncomplicated: Secondary | ICD-10-CM | POA: Insufficient documentation

## 2013-02-27 DIAGNOSIS — Z720 Tobacco use: Secondary | ICD-10-CM | POA: Insufficient documentation

## 2013-02-27 DIAGNOSIS — E785 Hyperlipidemia, unspecified: Secondary | ICD-10-CM

## 2013-02-27 DIAGNOSIS — I739 Peripheral vascular disease, unspecified: Secondary | ICD-10-CM

## 2013-02-27 DIAGNOSIS — I10 Essential (primary) hypertension: Secondary | ICD-10-CM

## 2013-02-27 DIAGNOSIS — I779 Disorder of arteries and arterioles, unspecified: Secondary | ICD-10-CM | POA: Insufficient documentation

## 2013-02-27 NOTE — Assessment & Plan Note (Signed)
Well-controlled on current medications 

## 2013-02-27 NOTE — Assessment & Plan Note (Signed)
Caitlin Oliver had carotid angiography performed by myself 02/25/13 revealed billing 95-99% bilateral internal carotid artery stenosis. She is neurologically asymptomatic. She is on aspirin. I have referred her to Dr. Annamarie Major him for consideration of staged carotid endarterectomy. He is already reviewed the angiograms.

## 2013-02-27 NOTE — Assessment & Plan Note (Signed)
Patient underwent angiography 02/25/13 revealing high-grade bilateral calcified ostial iliac disease as well as right common femoral artery stenosis. I believe she can be successfully percutaneously revascularized using diamondback rotational atherectomy and stenting. We will defer doing this until after her carotids are surgically revascularized.

## 2013-02-27 NOTE — Patient Instructions (Signed)
Your physician wants you to follow-up in: 2 months with Dr Gwenlyn Found. You will receive a reminder letter in the mail two months in advance. If you don't receive a letter, please call our office to schedule the follow-up appointment.

## 2013-02-27 NOTE — Assessment & Plan Note (Signed)
On statin therapy

## 2013-02-27 NOTE — Progress Notes (Signed)
02/27/2013 Caitlin Oliver   1940-11-04  [id number]  Primary Physician Woody Seller, MD Primary Cardiologist: Lorretta Harp MD [name]   HPI:  [name] is a 72 year old mildly overweight widowed Caucasian female no children (son died 43 years ago) referred by Caitlin Niemann PA-C at [facility] for evaluation and treatment of lifestyle limiting claudication. Risk factors included hypertension, hyperlipidemia and type 2 diabetes. She has a 75-pack-year history of tobacco abuse smoking one and a half packs a day. There's no family history of heart disease. She's never had a heart for stroke. She does complain of dyspnea but denies chest pain. She complains of bilateral lower extremity claudication when walking up an incline which is lifestyle limiting. Dopplers performed showed mildly decreased ABIs with high-frequency signals in both iliac arteries. She also has high-grade carotid disease by duplex ultrasound. I performed angiography her carotids and lower extremities revealing high-grade bilateral ICA stenosis which will need surgical revascularization as well as high grade bilateral iliac disease   Current Outpatient Prescriptions  Medication Sig Dispense Refill  . amLODipine (NORVASC) 5 MG tablet Take 5 mg by mouth daily.      . diazepam (VALIUM) 5 MG tablet Take 5 mg by mouth every 12 (twelve) hours as needed. Anxiety      . fenofibrate micronized (LOFIBRA) 134 MG capsule Take 134 mg by mouth daily before breakfast.      . fish oil-omega-3 fatty acids 1000 MG capsule Take 1 g by mouth daily.      . hydrochlorothiazide (MICROZIDE) 12.5 MG capsule Take 12.5 mg by mouth daily.      [name] lisinopril (PRINIVIL,ZESTRIL) 20 MG tablet Take 20 mg by mouth daily.      . metFORMIN (GLUCOPHAGE) 500 MG tablet Take 500 mg by mouth daily.       . metoCLOPramide (REGLAN) 10 MG tablet Take 10 mg by mouth 2 (two) times daily.       [name] omeprazole (PRILOSEC)  20 MG capsule Take 20 mg by mouth 2 (two) times daily before a meal.       . pravastatin (PRAVACHOL) 40 MG tablet Take 40 mg by mouth daily.      [name] aspirin EC 81 MG tablet Take 81 mg by mouth daily.       No current facility-administered medications for this visit.    Allergies  Allergen Reactions  . Estrace [Estradiol] Swelling    History   Social History  . Marital Status: Widowed    Spouse Name: N/A    Number of Children: N/A  . Years of Education: N/A   Occupational History  . Not on file.   Social History Main Topics  . Smoking status: Current Every Day Smoker -- 1.50 packs/day for 56 years    Types: Cigarettes  . Smokeless tobacco: Never Used  . Alcohol Use: No  . Drug Use: No  . Sexual Activity: Not on file   Other Topics Concern  . Not on file   Social History Narrative  . No narrative on file     Review of Systems: General: negative for chills, fever, night sweats or weight changes.  Cardiovascular: negative for chest pain, dyspnea on exertion, edema, orthopnea, palpitations, paroxysmal nocturnal dyspnea or shortness of breath Dermatological: negative for rash Respiratory: negative for cough or wheezing Urologic: negative for hematuria Abdominal: negative for nausea, vomiting, diarrhea, bright red blood per rectum, melena, or hematemesis Neurologic: negative for visual changes, syncope,  or dizziness All other systems reviewed and are otherwise negative except as noted above.    Blood pressure 150/64, pulse 96, height 5' 4.5" (1.638 m), weight 165 lb 4.8 oz (74.98 kg).  General appearance: alert and no distress Neck: no adenopathy, no JVD, supple, symmetrical, trachea midline, thyroid not enlarged, symmetric, no tenderness/mass/nodules and bilateral carotid bruits Lungs: clear to auscultation bilaterally Heart: regular rate and rhythm, S1, S2 normal, no murmur, click, rub or gallop Extremities: extremities normal, atraumatic, no cyanosis or edema and  rright femoral artery puncture site was well-healed  EKG performed today  ASSESSMENT AND PLAN:   Claudication Patient underwent angiography 02/25/13 revealing high-grade bilateral calcified ostial iliac disease as well as right common femoral artery stenosis. I believe she can be successfully percutaneously revascularized using diamondback rotational atherectomy and stenting. We will defer doing this until after her carotids are surgically revascularized.  Carotid artery disease [name] had carotid angiography performed by myself 02/25/13 revealed billing 95-99% bilateral internal carotid artery stenosis. She is neurologically asymptomatic. She is on aspirin. I have referred her to Dr. Annamarie Major him for consideration of staged carotid endarterectomy. He is already reviewed the angiograms.  Essential hypertension Well-controlled on current medications  Hyperlipidemia On statin therapy      Lorretta Harp MD [facility] 02/27/2013 2:26 PM

## 2013-03-02 ENCOUNTER — Ambulatory Visit (INDEPENDENT_AMBULATORY_CARE_PROVIDER_SITE_OTHER): Payer: Medicare Other | Admitting: Surgery

## 2013-03-02 ENCOUNTER — Other Ambulatory Visit (HOSPITAL_COMMUNITY): Payer: Self-pay | Admitting: *Deleted

## 2013-03-02 ENCOUNTER — Other Ambulatory Visit: Payer: Self-pay

## 2013-03-02 ENCOUNTER — Encounter: Payer: Self-pay | Admitting: Surgery

## 2013-03-02 DIAGNOSIS — I6529 Occlusion and stenosis of unspecified carotid artery: Secondary | ICD-10-CM | POA: Insufficient documentation

## 2013-03-02 NOTE — Progress Notes (Signed)
Vascular and Vein Specialist of [place]   Patient name: Caitlin Oliver MRN: [id number] DOB: 07/05/1940 Sex: female   Referred by: Dr. Gwenlyn Found  Reason for referral:  Chief Complaint  Patient presents with  . Carotid    bilateral ICA stenosis - studies at Dr. Kennon Holter office    HISTORY OF PRESENT ILLNESS: This is a very pleasant 72 year old female referred to me by Dr. Gwenlyn Found for evaluation of carotid stenosis, bilateral.  The patient was identified with a carotid bruit.  Ultrasound revealed greater than 70% bilateral carotid stenosis.  This was further confirmed with angiography.  Both sides were greater than 85%.  The patient remained asymptomatic.  Specifically, she denies numbness or weakness in either extremity.  She denies slurred speech.  She denies amaurosis fugax.  The patient suffers from diabetes.  She states that her blood sugars usually run of 80-90 range.  She does also have diabetic complications of gastroparesis for which she takes Reglan.  Her hypercholesterolemia is managed with a statin.  Her hypertension is managed with an ACE inhibitor.  She does have peripheral vascular disease, however this is been put on hold until her carotid issues have resolved.  She is a smoker but is trying to quit.  She suffers from chronic bronchitis secondary to her smoking.  Past Medical History  Diagnosis Date  . Hypertension   . Hypercholesteremia   . Diabetes mellitus   . Anxiety   . Stomach ulcer   . Claudication   . Tobacco abuse   . Carotid artery disease   . Peripheral arterial disease     bilateral iliac artery stenosis by angiography  . COPD (chronic obstructive pulmonary disease)     Past Surgical History  Procedure Laterality Date  . Tubal ligation    . Abdominal hysterectomy    . Breast reduction surgery    . Appendectomy      History   Social History  . Marital Status: Widowed    Spouse Name: N/A    Number of Children: N/A  . Years of Education: N/A    Occupational History  . Not on file.   Social History Main Topics  . Smoking status: Former Smoker -- 1.50 packs/day for 56 years    Types: Cigarettes  . Smokeless tobacco: Former Systems developer    Quit date: 02/25/2013  . Alcohol Use: No  . Drug Use: No  . Sexual Activity: Not on file   Other Topics Concern  . Not on file   Social History Narrative  . No narrative on file    Family History  Problem Relation Age of Onset  . Stroke Mother   . Hypertension Mother   . Kidney failure Father     Allergies as of 03/02/2013 - Review Complete 03/02/2013  Allergen Reaction Noted  . Estrace [estradiol] Swelling 11/19/2011    Current Outpatient Prescriptions on File Prior to Visit  Medication Sig Dispense Refill  . amLODipine (NORVASC) 5 MG tablet Take 5 mg by mouth daily.      [name] aspirin EC 81 MG tablet Take 81 mg by mouth daily.      . diazepam (VALIUM) 5 MG tablet Take 5 mg by mouth every 12 (twelve) hours as needed. Anxiety      . fenofibrate micronized (LOFIBRA) 134 MG capsule Take 134 mg by mouth daily before breakfast.      . fish oil-omega-3 fatty acids 1000 MG capsule Take 1 g by mouth daily.      [name]  hydrochlorothiazide (MICROZIDE) 12.5 MG capsule Take 12.5 mg by mouth daily.      [name] lisinopril (PRINIVIL,ZESTRIL) 20 MG tablet Take 20 mg by mouth daily.      . metFORMIN (GLUCOPHAGE) 500 MG tablet Take 500 mg by mouth daily.       . metoCLOPramide (REGLAN) 10 MG tablet Take 10 mg by mouth 2 (two) times daily.       [name] omeprazole (PRILOSEC) 20 MG capsule Take 20 mg by mouth 2 (two) times daily before a meal.       . pravastatin (PRAVACHOL) 40 MG tablet Take 40 mg by mouth daily.       No current facility-administered medications on file prior to visit.     REVIEW OF SYSTEMS: Cardiovascular: No chest pain, chest pressure, palpitations, orthopnea, or dyspnea on exertion.  Positive for pain in legs and walking No history of DVT or phlebitis. Pulmonary: Shortness of breath with  exertion. Neurologic: No weakness, paresthesias, aphasia, or amaurosis. No dizziness. Hematologic: No bleeding problems or clotting disorders. Musculoskeletal: No joint pain or joint swelling. Gastrointestinal: No blood in stool or hematemesis Genitourinary: No dysuria or hematuria. Psychiatric:: No history of major depression. Integumentary: No rashes or ulcers. Constitutional: No fever or chills.  PHYSICAL EXAMINATION: General: The patient appears their stated age.  Vital signs are BP 137/65  Pulse 78  Ht 5' 4.5" (1.638 m)  Wt 164 lb 6.4 oz (74.571 kg)  BMI 27.79 kg/m2  SpO2 100% HEENT:  No gross abnormalities Pulmonary: Respirations are non-labored Abdomen: Soft and non-tender  Musculoskeletal: There are no major deformities.   Neurologic: No focal weakness or paresthesias are detected, Skin: There are no ulcer or rashes noted. Psychiatric: The patient has normal affect. Cardiovascular: There is a regular rate and rhythm without significant murmur appreciated. Bilateral carotid bruits Diagnostic Studies: I have reviewed the outside ultrasound as well as angiography which reveals bilateral greater than 80% carotid stenosis, right greater than left.    Assessment:  Bilateral carotid stenosis, right greater than left Plan: We discussed medical management vs. carotid endarterectomy vs. stenting with the patient.  After well-informed discussion, we have elected to proceed with right carotid endarterectomy, with intentions of treating the left side once she recovers from the right.  We discussed the risks and benefits of the operation which include but are not limited due to risk of stroke, nerve injury, crit, complications, and bleeding.  All her questions were answered.  Her operation scheduled for this Friday, November 21     V. Leia Alf, M.D. [facility] of [place] Office: [phone] Pager:  [phone]

## 2013-03-02 NOTE — Pre-Procedure Instructions (Signed)
Caitlin Oliver  03/02/2013   Your procedure is scheduled on:  Friday, March 06, 2013 at 7:30 AM.   Report to [facility] Entrance "A" at 5:30 AM.   Call this number if you have problems the morning of surgery: [phone]   Remember:   Do not eat food or drink liquids after midnight Thursday, 03/05/13.   Take these medicines the morning of surgery with A SIP OF WATER: amLODipine (NORVASC), omeprazole (PRILOSEC), metoCLOPramide (REGLAN), diazepam (VALIUM) - if needed.  Stop all Vitamins, Herbal Medications, Fish Oil as of today, 03/03/13. Do NOT take any diabetic medications the morning of your surgery              Do not wear jewelry, make-up or nail polish.  Do not wear lotions, powders, or perfumes. You may wear deodorant.  Do not shave 48 hours prior to surgery.  Do not bring valuables to the hospital.  [facility] is not responsible for any belongings or valuables.               Contacts, dentures or bridgework may not be worn into surgery.  Leave suitcase in the car. After surgery it may be brought to your room.  For patients admitted to the hospital, discharge time is determined by your treatment team.              Special Instructions: Shower using CHG 2 nights before surgery and the night before surgery.  If you shower the day of surgery use CHG.  Use special wash - you have one bottle of CHG for all showers.  You should use approximately 1/3 of the bottle for each shower.   Please read over the following fact sheets that you were given: Pain Booklet, Coughing and Deep Breathing, Blood Transfusion Information, MRSA Information and Surgical Site Infection Prevention

## 2013-03-03 ENCOUNTER — Encounter (HOSPITAL_COMMUNITY): Payer: Self-pay

## 2013-03-03 ENCOUNTER — Encounter (HOSPITAL_COMMUNITY)
Admission: RE | Admit: 2013-03-03 | Discharge: 2013-03-03 | Disposition: A | Discharge status: Home / Self Care | Payer: Medicare Other | Source: Ambulatory Visit | Attending: Surgery | Admitting: Surgery

## 2013-03-03 HISTORY — DX: Shortness of breath: R06.02

## 2013-03-03 HISTORY — DX: Gastro-esophageal reflux disease without esophagitis: K21.9

## 2013-03-03 LAB — CBC
Hemoglobin: 16.2 g/dL — ABNORMAL HIGH (ref 12.0–15.0)
MCH: 30.7 pg (ref 26.0–34.0)
MCHC: 33.6 g/dL (ref 30.0–36.0)
MCV: 91.3 fL (ref 78.0–100.0)
RDW: 12.2 % (ref 11.5–15.5)

## 2013-03-03 LAB — URINALYSIS, ROUTINE W REFLEX MICROSCOPIC
Bilirubin Urine: NEGATIVE
Hgb urine dipstick: NEGATIVE
Leukocytes, UA: NEGATIVE
Nitrite: NEGATIVE
Protein, ur: NEGATIVE mg/dL
Urobilinogen, UA: 0.2 mg/dL (ref 0.0–1.0)

## 2013-03-03 LAB — COMPREHENSIVE METABOLIC PANEL
ALT: 15 U/L (ref 0–35)
Albumin: 4.2 g/dL (ref 3.5–5.2)
BUN: 12 mg/dL (ref 6–23)
Calcium: 9.9 mg/dL (ref 8.4–10.5)
Creatinine, Ser: 0.64 mg/dL (ref 0.50–1.10)
GFR calc Af Amer: >90 mL/min (ref >90)
Glucose, Bld: 103 mg/dL — ABNORMAL HIGH (ref 70–99)
Potassium: 4.6 mEq/L (ref 3.5–5.1)
Sodium: 132 mEq/L — ABNORMAL LOW (ref 135–145)
Total Protein: 7.5 g/dL (ref 6.0–8.3)

## 2013-03-03 LAB — ABO/RH: ABO/RH(D): A POS

## 2013-03-03 LAB — TYPE AND SCREEN
ABO/RH(D): A POS
Antibody Screen: NEGATIVE

## 2013-03-03 LAB — PROTIME-INR: Prothrombin Time: 12.2 seconds (ref 11.6–15.2)

## 2013-03-03 LAB — APTT: aPTT: 32 seconds (ref 24–37)

## 2013-03-03 LAB — SURGICAL PCR SCREEN: Staphylococcus aureus: NEGATIVE

## 2013-03-03 NOTE — Progress Notes (Signed)
Patient had a stress test on 01/29/13. Encounter in [place]. Patient denied having a cardiac cath or sleep study. PCP is Dr. Kathryne Eriksson at [facility] in [place]. Cardiologist is Dr. Quay Burow. Patient denied having any acute cardiac or pulmonary issues.

## 2013-03-05 MED ORDER — DEXTROSE 5 % IV SOLN
1.5000 g | INTRAVENOUS | Status: DC
  Administered 2013-03-06: 1.5 g via INTRAVENOUS
  Filled 2013-03-05: qty 1.5

## 2013-03-06 ENCOUNTER — Inpatient Hospital Stay (HOSPITAL_COMMUNITY)
Admission: RE | Admit: 2013-03-06 | Discharge: 2013-03-07 | DRG: 039 | Disposition: A | Discharge status: Home / Self Care | Payer: Medicare Other | Source: Ambulatory Visit | Attending: Surgery | Admitting: Surgery

## 2013-03-06 ENCOUNTER — Encounter (HOSPITAL_COMMUNITY): Payer: Self-pay | Admitting: Anesthesiology

## 2013-03-06 ENCOUNTER — Inpatient Hospital Stay (HOSPITAL_COMMUNITY): Payer: Medicare Other | Admitting: Anesthesiology

## 2013-03-06 ENCOUNTER — Encounter (HOSPITAL_COMMUNITY)
Admission: RE | Disposition: A | Discharge status: Home / Self Care | Payer: Self-pay | Source: Ambulatory Visit | Attending: Surgery

## 2013-03-06 ENCOUNTER — Telehealth: Payer: Self-pay | Admitting: Surgery

## 2013-03-06 ENCOUNTER — Encounter (HOSPITAL_COMMUNITY): Payer: Medicare Other | Admitting: Anesthesiology

## 2013-03-06 DIAGNOSIS — F17200 Nicotine dependence, unspecified, uncomplicated: Secondary | ICD-10-CM | POA: Diagnosis present

## 2013-03-06 DIAGNOSIS — Z79899 Other long term (current) drug therapy: Secondary | ICD-10-CM

## 2013-03-06 DIAGNOSIS — E1149 Type 2 diabetes mellitus with other diabetic neurological complication: Secondary | ICD-10-CM | POA: Diagnosis present

## 2013-03-06 DIAGNOSIS — J449 Chronic obstructive pulmonary disease, unspecified: Secondary | ICD-10-CM | POA: Diagnosis present

## 2013-03-06 DIAGNOSIS — Z7982 Long term (current) use of aspirin: Secondary | ICD-10-CM

## 2013-03-06 DIAGNOSIS — K219 Gastro-esophageal reflux disease without esophagitis: Secondary | ICD-10-CM | POA: Diagnosis present

## 2013-03-06 DIAGNOSIS — Z01812 Encounter for preprocedural laboratory examination: Secondary | ICD-10-CM

## 2013-03-06 DIAGNOSIS — Z823 Family history of stroke: Secondary | ICD-10-CM

## 2013-03-06 DIAGNOSIS — I6529 Occlusion and stenosis of unspecified carotid artery: Secondary | ICD-10-CM

## 2013-03-06 DIAGNOSIS — Z841 Family history of disorders of kidney and ureter: Secondary | ICD-10-CM

## 2013-03-06 DIAGNOSIS — Z8249 Family history of ischemic heart disease and other diseases of the circulatory system: Secondary | ICD-10-CM

## 2013-03-06 DIAGNOSIS — E7800 Pure hypercholesterolemia, unspecified: Secondary | ICD-10-CM | POA: Diagnosis present

## 2013-03-06 DIAGNOSIS — Z9851 Tubal ligation status: Secondary | ICD-10-CM

## 2013-03-06 DIAGNOSIS — I658 Occlusion and stenosis of other precerebral arteries: Secondary | ICD-10-CM | POA: Diagnosis present

## 2013-03-06 DIAGNOSIS — I9589 Other hypotension: Secondary | ICD-10-CM | POA: Diagnosis present

## 2013-03-06 DIAGNOSIS — F411 Generalized anxiety disorder: Secondary | ICD-10-CM | POA: Diagnosis present

## 2013-03-06 DIAGNOSIS — K3184 Gastroparesis: Secondary | ICD-10-CM | POA: Diagnosis present

## 2013-03-06 DIAGNOSIS — J4489 Other specified chronic obstructive pulmonary disease: Secondary | ICD-10-CM | POA: Diagnosis present

## 2013-03-06 DIAGNOSIS — Z9089 Acquired absence of other organs: Secondary | ICD-10-CM

## 2013-03-06 DIAGNOSIS — I10 Essential (primary) hypertension: Secondary | ICD-10-CM | POA: Diagnosis present

## 2013-03-06 HISTORY — PX: PATCH ANGIOPLASTY: SHX6230

## 2013-03-06 HISTORY — PX: ENDARTERECTOMY: SHX5162

## 2013-03-06 LAB — HEMOGLOBIN A1C
Hgb A1c MFr Bld: 5.7 % — ABNORMAL HIGH (ref <5.7)
Mean Plasma Glucose: 117 mg/dL — ABNORMAL HIGH (ref <117)

## 2013-03-06 LAB — GLUCOSE, CAPILLARY
Glucose-Capillary: 105 mg/dL — ABNORMAL HIGH (ref 70–99)
Glucose-Capillary: 114 mg/dL — ABNORMAL HIGH (ref 70–99)
Glucose-Capillary: 116 mg/dL — ABNORMAL HIGH (ref 70–99)

## 2013-03-06 SURGERY — ENDARTERECTOMY, CAROTID
Anesthesia: General | Site: Neck | Laterality: Right | Wound class: Clean

## 2013-03-06 MED ORDER — DOPAMINE-DEXTROSE 3.2-5 MG/ML-% IV SOLN
2.0000 ug/kg/min | INTRAVENOUS | Status: DC
  Administered 2013-03-06: 3 ug/kg/min via INTRAVENOUS

## 2013-03-06 MED ORDER — METOCLOPRAMIDE HCL 10 MG PO TABS
10.0000 mg | ORAL_TABLET | Freq: Two times a day (BID) | ORAL | Status: DC
  Administered 2013-03-06 – 2013-03-07 (×2): 10 mg via ORAL
  Filled 2013-03-06 (×4): qty 1

## 2013-03-06 MED ORDER — MORPHINE SULFATE 2 MG/ML IJ SOLN
2.0000 mg | INTRAMUSCULAR | Status: DC | PRN
  Administered 2013-03-06 (×2): 2 mg via INTRAVENOUS
  Filled 2013-03-06: qty 1

## 2013-03-06 MED ORDER — PHENYLEPHRINE HCL 10 MG/ML IJ SOLN
10.0000 mg | INTRAMUSCULAR | Status: DC | PRN
  Administered 2013-03-06: 50 ug/min via INTRAVENOUS

## 2013-03-06 MED ORDER — LABETALOL HCL 5 MG/ML IV SOLN: 10.0000 mg | INTRAVENOUS | Status: DC | PRN

## 2013-03-06 MED ORDER — FENTANYL CITRATE 0.05 MG/ML IJ SOLN
INTRAMUSCULAR | Status: DC | PRN
  Administered 2013-03-06 (×2): 100 ug via INTRAVENOUS

## 2013-03-06 MED ORDER — EPHEDRINE SULFATE 50 MG/ML IJ SOLN
INTRAMUSCULAR | Status: DC | PRN
  Administered 2013-03-06: 10 mg via INTRAVENOUS

## 2013-03-06 MED ORDER — ALUM & MAG HYDROXIDE-SIMETH 200-200-20 MG/5ML PO SUSP: 15.0000 mL | ORAL | Status: DC | PRN

## 2013-03-06 MED ORDER — SODIUM CHLORIDE 0.9 % IR SOLN
Status: DC | PRN
  Administered 2013-03-06: 07:00:00

## 2013-03-06 MED ORDER — NEOSTIGMINE METHYLSULFATE 1 MG/ML IJ SOLN
INTRAMUSCULAR | Status: DC | PRN
  Administered 2013-03-06: 3 mg via INTRAVENOUS

## 2013-03-06 MED ORDER — PANTOPRAZOLE SODIUM 40 MG PO TBEC
40.0000 mg | DELAYED_RELEASE_TABLET | Freq: Every day | ORAL | Status: DC
  Administered 2013-03-07: 40 mg via ORAL
  Filled 2013-03-06: qty 1

## 2013-03-06 MED ORDER — POTASSIUM CHLORIDE CRYS ER 20 MEQ PO TBCR: 20.0000 meq | EXTENDED_RELEASE_TABLET | Freq: Once | ORAL | Status: AC | PRN

## 2013-03-06 MED ORDER — SODIUM CHLORIDE 0.9 % IV SOLN: INTRAVENOUS | Status: DC

## 2013-03-06 MED ORDER — OXYCODONE HCL 5 MG/5ML PO SOLN: 5.0000 mg | Freq: Once | ORAL | Status: DC | PRN

## 2013-03-06 MED ORDER — ROCURONIUM BROMIDE 100 MG/10ML IV SOLN
INTRAVENOUS | Status: DC | PRN
  Administered 2013-03-06: 50 mg via INTRAVENOUS

## 2013-03-06 MED ORDER — HYDROMORPHONE HCL PF 1 MG/ML IJ SOLN: 0.2500 mg | INTRAMUSCULAR | Status: DC | PRN

## 2013-03-06 MED ORDER — OXYCODONE-ACETAMINOPHEN 5-325 MG PO TABS
1.0000 | ORAL_TABLET | ORAL | Status: DC | PRN
  Administered 2013-03-07: 1 via ORAL
  Filled 2013-03-06: qty 1

## 2013-03-06 MED ORDER — GLYCOPYRROLATE 0.2 MG/ML IJ SOLN
INTRAMUSCULAR | Status: DC | PRN
  Administered 2013-03-06: 0.6 mg via INTRAVENOUS
  Administered 2013-03-06: 0.2 mg via INTRAVENOUS

## 2013-03-06 MED ORDER — DEXTROSE 5 % IV SOLN
1.5000 g | Freq: Two times a day (BID) | INTRAVENOUS | Status: AC
  Administered 2013-03-06 – 2013-03-07 (×2): 1.5 g via INTRAVENOUS
  Filled 2013-03-06 (×2): qty 1.5

## 2013-03-06 MED ORDER — PROTAMINE SULFATE 10 MG/ML IV SOLN
INTRAVENOUS | Status: DC | PRN
  Administered 2013-03-06 (×5): 10 mg via INTRAVENOUS

## 2013-03-06 MED ORDER — HYDROCHLOROTHIAZIDE 12.5 MG PO CAPS
12.5000 mg | ORAL_CAPSULE | Freq: Every day | ORAL | Status: DC
  Filled 2013-03-06: qty 1

## 2013-03-06 MED ORDER — ONDANSETRON HCL 4 MG/2ML IJ SOLN
INTRAMUSCULAR | Status: DC | PRN
  Administered 2013-03-06: 4 mg via INTRAVENOUS

## 2013-03-06 MED ORDER — HYDRALAZINE HCL 20 MG/ML IJ SOLN: 10.0000 mg | INTRAMUSCULAR | Status: DC | PRN

## 2013-03-06 MED ORDER — AMLODIPINE BESYLATE 5 MG PO TABS
5.0000 mg | ORAL_TABLET | Freq: Every day | ORAL | Status: DC
  Filled 2013-03-06: qty 1

## 2013-03-06 MED ORDER — ASPIRIN EC 81 MG PO TBEC
81.0000 mg | DELAYED_RELEASE_TABLET | Freq: Every day | ORAL | Status: DC
  Administered 2013-03-07: 81 mg via ORAL
  Filled 2013-03-06: qty 1

## 2013-03-06 MED ORDER — ONDANSETRON HCL 4 MG/2ML IJ SOLN: 4.0000 mg | Freq: Four times a day (QID) | INTRAMUSCULAR | Status: DC | PRN

## 2013-03-06 MED ORDER — 0.9 % SODIUM CHLORIDE (POUR BTL) OPTIME
TOPICAL | Status: DC | PRN
  Administered 2013-03-06: 2000 mL

## 2013-03-06 MED ORDER — SODIUM CHLORIDE 0.9 % IV SOLN: 500.0000 mL | Freq: Once | INTRAVENOUS | Status: AC | PRN

## 2013-03-06 MED ORDER — PHENOL 1.4 % MT LIQD: 1.0000 | OROMUCOSAL | Status: DC | PRN

## 2013-03-06 MED ORDER — METOPROLOL TARTRATE 1 MG/ML IV SOLN: 2.0000 mg | INTRAVENOUS | Status: DC | PRN

## 2013-03-06 MED ORDER — DOPAMINE-DEXTROSE 3.2-5 MG/ML-% IV SOLN
INTRAVENOUS | Status: AC
  Administered 2013-03-06: 800 mg
  Filled 2013-03-06: qty 250

## 2013-03-06 MED ORDER — INSULIN ASPART 100 UNIT/ML ~~LOC~~ SOLN: 0.0000 [IU] | Freq: Three times a day (TID) | SUBCUTANEOUS | Status: DC

## 2013-03-06 MED ORDER — ACETAMINOPHEN 650 MG RE SUPP: 325.0000 mg | RECTAL | Status: DC | PRN

## 2013-03-06 MED ORDER — OXYCODONE-ACETAMINOPHEN 5-325 MG PO TABS: 1.0000 | ORAL_TABLET | ORAL | Status: DC | PRN

## 2013-03-06 MED ORDER — LIDOCAINE HCL (CARDIAC) 20 MG/ML IV SOLN
INTRAVENOUS | Status: DC | PRN
  Administered 2013-03-06: 10 mg via INTRAVENOUS
  Administered 2013-03-06: 40 mg via INTRAVENOUS

## 2013-03-06 MED ORDER — LACTATED RINGERS IV SOLN
INTRAVENOUS | Status: DC | PRN
  Administered 2013-03-06 (×2): via INTRAVENOUS

## 2013-03-06 MED ORDER — SODIUM CHLORIDE 0.9 % IV SOLN
INTRAVENOUS | Status: DC
  Administered 2013-03-06: 13:00:00 via INTRAVENOUS

## 2013-03-06 MED ORDER — LISINOPRIL 20 MG PO TABS
20.0000 mg | ORAL_TABLET | Freq: Every day | ORAL | Status: DC
  Filled 2013-03-06: qty 1

## 2013-03-06 MED ORDER — OXYCODONE HCL 5 MG PO TABS: 5.0000 mg | ORAL_TABLET | Freq: Once | ORAL | Status: DC | PRN

## 2013-03-06 MED ORDER — MORPHINE SULFATE 2 MG/ML IJ SOLN
INTRAMUSCULAR | Status: AC
  Filled 2013-03-06: qty 1

## 2013-03-06 MED ORDER — LIDOCAINE HCL (PF) 1 % IJ SOLN
INTRAMUSCULAR | Status: AC
  Filled 2013-03-06: qty 30

## 2013-03-06 MED ORDER — PROPOFOL 10 MG/ML IV BOLUS
INTRAVENOUS | Status: DC | PRN
  Administered 2013-03-06: 100 mg via INTRAVENOUS

## 2013-03-06 MED ORDER — GUAIFENESIN-DM 100-10 MG/5ML PO SYRP: 15.0000 mL | ORAL_SOLUTION | ORAL | Status: DC | PRN

## 2013-03-06 MED ORDER — ACETAMINOPHEN 325 MG PO TABS
325.0000 mg | ORAL_TABLET | ORAL | Status: DC | PRN
  Administered 2013-03-07: 650 mg via ORAL
  Filled 2013-03-06: qty 2

## 2013-03-06 MED ORDER — ONDANSETRON HCL 4 MG/2ML IJ SOLN: 4.0000 mg | Freq: Once | INTRAMUSCULAR | Status: DC | PRN

## 2013-03-06 MED ORDER — PHENYLEPHRINE HCL 10 MG/ML IJ SOLN
30.0000 ug/min | INTRAVENOUS | Status: DC
  Filled 2013-03-06: qty 1

## 2013-03-06 MED ORDER — HEMOSTATIC AGENTS (NO CHARGE) OPTIME
TOPICAL | Status: DC | PRN
  Administered 2013-03-06: 1 via TOPICAL

## 2013-03-06 MED ORDER — METOCLOPRAMIDE HCL 10 MG PO TABS
10.0000 mg | ORAL_TABLET | Freq: Two times a day (BID) | ORAL | Status: DC
  Filled 2013-03-06: qty 1

## 2013-03-06 MED ORDER — DOCUSATE SODIUM 100 MG PO CAPS: 100.0000 mg | ORAL_CAPSULE | Freq: Every day | ORAL | Status: DC

## 2013-03-06 MED ORDER — ALBUMIN HUMAN 5 % IV SOLN
INTRAVENOUS | Status: AC
  Administered 2013-03-06: 12.5 g
  Filled 2013-03-06: qty 250

## 2013-03-06 MED ORDER — SIMVASTATIN 20 MG PO TABS
20.0000 mg | ORAL_TABLET | Freq: Every day | ORAL | Status: DC
  Administered 2013-03-06: 20 mg via ORAL
  Filled 2013-03-06 (×2): qty 1

## 2013-03-06 MED ORDER — HEPARIN SODIUM (PORCINE) 1000 UNIT/ML IJ SOLN
INTRAMUSCULAR | Status: DC | PRN
  Administered 2013-03-06: 7000 [IU] via INTRAVENOUS

## 2013-03-06 SURGICAL SUPPLY — 55 items
ADH SKN CLS APL DERMABOND .7 (GAUZE/BANDAGES/DRESSINGS) ×1
CANISTER SUCTION 2500CC (MISCELLANEOUS) ×2 IMPLANT
CATH ROBINSON RED A/P 18FR (CATHETERS) ×2 IMPLANT
CATH SUCT 10FR WHISTLE TIP (CATHETERS) ×2 IMPLANT
CLIP TI MEDIUM 24 (CLIP) ×1 IMPLANT
CLIP TI MEDIUM 6 (CLIP) ×1 IMPLANT
CLIP TI MEDIUM LARGE 6 (CLIP) ×1 IMPLANT
CLIP TI WIDE RED SMALL 24 (CLIP) ×1 IMPLANT
COVER SURGICAL LIGHT HANDLE (MISCELLANEOUS) ×2 IMPLANT
CRADLE DONUT ADULT HEAD (MISCELLANEOUS) ×2 IMPLANT
DERMABOND ADVANCED (GAUZE/BANDAGES/DRESSINGS) ×1
DERMABOND ADVANCED .7 DNX12 (GAUZE/BANDAGES/DRESSINGS) ×1 IMPLANT
DRAIN CHANNEL 15F RND FF W/TCR (WOUND CARE) IMPLANT
DRAPE WARM FLUID 44X44 (DRAPE) ×2 IMPLANT
ELECT REM PT RETURN 9FT ADLT (ELECTROSURGICAL) ×2
ELECTRODE REM PT RTRN 9FT ADLT (ELECTROSURGICAL) ×1 IMPLANT
EVACUATOR SILICONE 100CC (DRAIN) IMPLANT
GLOVE BIO SURGEON STRL SZ 6.5 (GLOVE) ×1 IMPLANT
GLOVE BIOGEL PI IND STRL 6.5 (GLOVE) IMPLANT
GLOVE BIOGEL PI IND STRL 7.0 (GLOVE) IMPLANT
GLOVE BIOGEL PI IND STRL 7.5 (GLOVE) ×1 IMPLANT
GLOVE BIOGEL PI INDICATOR 6.5 (GLOVE) ×2
GLOVE BIOGEL PI INDICATOR 7.0 (GLOVE) ×3
GLOVE BIOGEL PI INDICATOR 7.5 (GLOVE) ×1
GLOVE SS BIOGEL STRL SZ 6.5 (GLOVE) IMPLANT
GLOVE SUPERSENSE BIOGEL SZ 6.5 (GLOVE) ×1
GLOVE SURG SS PI 6.5 STRL IVOR (GLOVE) ×1 IMPLANT
GLOVE SURG SS PI 7.0 STRL IVOR (GLOVE) ×4 IMPLANT
GLOVE SURG SS PI 7.5 STRL IVOR (GLOVE) ×2 IMPLANT
GOWN PREVENTION PLUS XXLARGE (GOWN DISPOSABLE) ×2 IMPLANT
GOWN STRL NON-REIN LRG LVL3 (GOWN DISPOSABLE) ×6 IMPLANT
GOWN STRL REIN XL XLG (GOWN DISPOSABLE) ×3 IMPLANT
HEMOSTAT SNOW SURGICEL 2X4 (HEMOSTASIS) ×1 IMPLANT
INSERT FOGARTY SM (MISCELLANEOUS) IMPLANT
KIT BASIN OR (CUSTOM PROCEDURE TRAY) ×2 IMPLANT
KIT ROOM TURNOVER OR (KITS) ×2 IMPLANT
NS IRRIG 1000ML POUR BTL (IV SOLUTION) ×4 IMPLANT
PACK CAROTID (CUSTOM PROCEDURE TRAY) ×2 IMPLANT
PAD ARMBOARD 7.5X6 YLW CONV (MISCELLANEOUS) ×4 IMPLANT
PATCH VASCULAR VASCU GUARD 1X6 (Vascular Products) ×1 IMPLANT
SHUNT CAROTID BYPASS 10 (VASCULAR PRODUCTS) IMPLANT
SHUNT CAROTID BYPASS 12FRX15.5 (VASCULAR PRODUCTS) IMPLANT
SPONGE INTESTINAL PEANUT (DISPOSABLE) ×2 IMPLANT
SUT ETHILON 3 0 PS 1 (SUTURE) IMPLANT
SUT PROLENE 6 0 BV (SUTURE) ×2 IMPLANT
SUT PROLENE 7 0 BV 1 (SUTURE) IMPLANT
SUT PROLENE 7 0 BV1 MDA (SUTURE) ×1 IMPLANT
SUT SILK 3 0 TIES 17X18 (SUTURE)
SUT SILK 3-0 18XBRD TIE BLK (SUTURE) IMPLANT
SUT VIC AB 3-0 SH 27 (SUTURE) ×4
SUT VIC AB 3-0 SH 27X BRD (SUTURE) ×2 IMPLANT
SUT VICRYL 4-0 PS2 18IN ABS (SUTURE) ×2 IMPLANT
TOWEL OR 17X24 6PK STRL BLUE (TOWEL DISPOSABLE) ×2 IMPLANT
TOWEL OR 17X26 10 PK STRL BLUE (TOWEL DISPOSABLE) ×2 IMPLANT
WATER STERILE IRR 1000ML POUR (IV SOLUTION) ×2 IMPLANT

## 2013-03-06 NOTE — H&P (View-Only) (Signed)
Vascular and Vein Specialist of    Patient name: Caitlin Oliver MRN: [id number] DOB: 04/01/1941 Sex: female   Referred by: Dr. Berry  Reason for referral:  Chief Complaint  Patient presents with  . Carotid    bilateral ICA stenosis - studies at Dr. Berry's office    HISTORY OF PRESENT ILLNESS: This is a very pleasant 72-year-old female referred to me by Dr. Berry for evaluation of carotid stenosis, bilateral.  The patient was identified with a carotid bruit.  Ultrasound revealed greater than 70% bilateral carotid stenosis.  This was further confirmed with angiography.  Both sides were greater than 85%.  The patient remained asymptomatic.  Specifically, she denies numbness or weakness in either extremity.  She denies slurred speech.  She denies amaurosis fugax.  The patient suffers from diabetes.  She states that her blood sugars usually run of 80-90 range.  She does also have diabetic complications of gastroparesis for which she takes Reglan.  Her hypercholesterolemia is managed with a statin.  Her hypertension is managed with an ACE inhibitor.  She does have peripheral vascular disease, however this is been put on hold until her carotid issues have resolved.  She is a smoker but is trying to quit.  She suffers from chronic bronchitis secondary to her smoking.  Past Medical History  Diagnosis Date  . Hypertension   . Hypercholesteremia   . Diabetes mellitus   . Anxiety   . Stomach ulcer   . Claudication   . Tobacco abuse   . Carotid artery disease   . Peripheral arterial disease     bilateral iliac artery stenosis by angiography  . COPD (chronic obstructive pulmonary disease)     Past Surgical History  Procedure Laterality Date  . Tubal ligation    . Abdominal hysterectomy    . Breast reduction surgery    . Appendectomy      History   Social History  . Marital Status: Widowed    Spouse Name: N/A    Number of Children: N/A  . Years of Education: N/A    Occupational History  . Not on file.   Social History Main Topics  . Smoking status: Former Smoker -- 1.50 packs/day for 56 years    Types: Cigarettes  . Smokeless tobacco: Former User    Quit date: 02/25/2013  . Alcohol Use: No  . Drug Use: No  . Sexual Activity: Not on file   Other Topics Concern  . Not on file   Social History Narrative  . No narrative on file    Family History  Problem Relation Age of Onset  . Stroke Mother   . Hypertension Mother   . Kidney failure Father     Allergies as of 03/02/2013 - Review Complete 03/02/2013  Allergen Reaction Noted  . Estrace [estradiol] Swelling 11/19/2011    Current Outpatient Prescriptions on File Prior to Visit  Medication Sig Dispense Refill  . amLODipine (NORVASC) 5 MG tablet Take 5 mg by mouth daily.      . aspirin EC 81 MG tablet Take 81 mg by mouth daily.      . diazepam (VALIUM) 5 MG tablet Take 5 mg by mouth every 12 (twelve) hours as needed. Anxiety      . fenofibrate micronized (LOFIBRA) 134 MG capsule Take 134 mg by mouth daily before breakfast.      . fish oil-omega-3 fatty acids 1000 MG capsule Take 1 g by mouth daily.      .   hydrochlorothiazide (MICROZIDE) 12.5 MG capsule Take 12.5 mg by mouth daily.      . lisinopril (PRINIVIL,ZESTRIL) 20 MG tablet Take 20 mg by mouth daily.      . metFORMIN (GLUCOPHAGE) 500 MG tablet Take 500 mg by mouth daily.       . metoCLOPramide (REGLAN) 10 MG tablet Take 10 mg by mouth 2 (two) times daily.       . omeprazole (PRILOSEC) 20 MG capsule Take 20 mg by mouth 2 (two) times daily before a meal.       . pravastatin (PRAVACHOL) 40 MG tablet Take 40 mg by mouth daily.       No current facility-administered medications on file prior to visit.     REVIEW OF SYSTEMS: Cardiovascular: No chest pain, chest pressure, palpitations, orthopnea, or dyspnea on exertion.  Positive for pain in legs and walking No history of DVT or phlebitis. Pulmonary: Shortness of breath with  exertion. Neurologic: No weakness, paresthesias, aphasia, or amaurosis. No dizziness. Hematologic: No bleeding problems or clotting disorders. Musculoskeletal: No joint pain or joint swelling. Gastrointestinal: No blood in stool or hematemesis Genitourinary: No dysuria or hematuria. Psychiatric:: No history of major depression. Integumentary: No rashes or ulcers. Constitutional: No fever or chills.  PHYSICAL EXAMINATION: General: The patient appears their stated age.  Vital signs are BP 137/65  Pulse 78  Ht 5' 4.5" (1.638 m)  Wt 164 lb 6.4 oz (74.571 kg)  BMI 27.79 kg/m2  SpO2 100% HEENT:  No gross abnormalities Pulmonary: Respirations are non-labored Abdomen: Soft and non-tender  Musculoskeletal: There are no major deformities.   Neurologic: No focal weakness or paresthesias are detected, Skin: There are no ulcer or rashes noted. Psychiatric: The patient has normal affect. Cardiovascular: There is a regular rate and rhythm without significant murmur appreciated. Bilateral carotid bruits Diagnostic Studies: I have reviewed the outside ultrasound as well as angiography which reveals bilateral greater than 80% carotid stenosis, right greater than left.    Assessment:  Bilateral carotid stenosis, right greater than left Plan: We discussed medical management vs. carotid endarterectomy vs. stenting with the patient.  After well-informed discussion, we have elected to proceed with right carotid endarterectomy, with intentions of treating the left side once she recovers from the right.  We discussed the risks and benefits of the operation which include but are not limited due to risk of stroke, nerve injury, crit, complications, and bleeding.  All her questions were answered.  Her operation scheduled for this Friday, November 21     V. [place] Lynnita Somma IV, M.D. [facility] of [place] Office: [phone] Pager:  [phone]   

## 2013-03-06 NOTE — Preoperative (Signed)
Beta Blockers   Reason not to administer Beta Blockers:Not Applicable 

## 2013-03-06 NOTE — Discharge Summary (Signed)
[facility] Discharge Summary   Patient ID:  Caitlin Oliver MRN: [id number] DOB/AGE: April 12, 1941 72 y.o.  Admit date: 03/06/2013 Discharge date: 03/06/2013 Date of Surgery: 03/06/2013 Surgeon: Surgeon(s): Serafina Mitchell, MD  Admission Diagnosis: RIGHT INTERNAL CAROTID ARTERY STENOSIS  Discharge Diagnoses:  RIGHT INTERNAL CAROTID ARTERY STENOSIS  Secondary Diagnoses: Past Medical History  Diagnosis Date  . Hypertension   . Hypercholesteremia   . Diabetes mellitus   . Anxiety   . Stomach ulcer   . Claudication   . Tobacco abuse   . Carotid artery disease   . Peripheral arterial disease     bilateral iliac artery stenosis by angiography  . COPD (chronic obstructive pulmonary disease)   . Shortness of breath     with exertion  . GERD (gastroesophageal reflux disease)     Procedure(s): ENDARTERECTOMY CAROTID-RIGHT PATCH ANGIOPLASTY of Right Carotid Artery using Vascu-Guard Patch  Discharged Condition: good  HPI:  Caitlin Oliver is a 72 y.o. female  referred  by Dr. Gwenlyn Found for evaluation of carotid stenosis, bilateral. The patient was identified with a carotid bruit. Ultrasound revealed greater than 70% bilateral carotid stenosis. This was further confirmed with angiography. Both sides were greater than 85%. The patient remained asymptomatic. Specifically, she denies numbness or weakness in either extremity. She denies slurred speech. She denies amaurosis fugax.  The patient suffers from diabetes. She states that her blood sugars usually run of 80-90 range. She does also have diabetic complications of gastroparesis for which she takes Reglan. Her hypercholesterolemia is managed with a statin. Her hypertension is managed with an ACE inhibitor. She does have peripheral vascular disease, however this is been put on hold until her carotid issues have resolved. She is a smoker but is trying to quit. She suffers from chronic bronchitis secondary to her smoking. After  well-informed discussion, we have elected to proceed with right carotid endarterectomy, with intentions of treating the left side once she recovers from the right. We discussed the risks and benefits of the operation which include but are not limited due to risk of stroke, nerve injury, crit, complications, and bleeding. All her questions were answered. Her operation scheduled for this Friday, November 21     Hospital Course:  DYLANIE Oliver is a 72 y.o. female is S/P  Procedure(s): ENDARTERECTOMY CAROTID-RIGHT PATCH ANGIOPLASTY of Right Carotid Artery using Vascu-Guard Patch Extubated: POD # 0 Physical exam:  Pt is A&O x 3 Gait is normal Speech is fluent right Neck Wound is healing well Patient with Negative tongue deviation and Negative facial droop Pt has good and equal strength in all extremities Pt. Ambulating, voiding and taking PO diet without difficulty. Pt pain controlled with PO pain meds. Labs as below Complications:none  Consults:     Significant Diagnostic Studies: CBC Lab Results  Component Value Date   WBC 5.3 03/03/2013   HGB 16.2* 03/03/2013   HCT 48.2* 03/03/2013   MCV 91.3 03/03/2013   PLT 298 03/03/2013    BMET    Component Value Date/Time   NA 132* 03/03/2013 1015   K 4.6 03/03/2013 1015   CL 93* 03/03/2013 1015   CO2 28 03/03/2013 1015   GLUCOSE 103* 03/03/2013 1015   BUN 12 03/03/2013 1015   CREATININE 0.64 03/03/2013 1015   CREATININE 0.70 02/18/2013 1348   CALCIUM 9.9 03/03/2013 1015   GFRNONAA 87* 03/03/2013 1015   GFRAA >90 03/03/2013 1015   COAG Lab Results  Component Value Date   INR  0.92 03/03/2013   INR 0.93 02/18/2013   INR 0.93 11/19/2011     Disposition:  Discharge to :Home Discharge Orders   Future Orders Complete By Expires   Call MD for:  redness, tenderness, or signs of infection (pain, swelling, bleeding, redness, odor or green/yellow discharge around incision site)  As directed    Call MD for:  severe or increased  pain, loss or decreased feeling  in affected limb(s)  As directed    Call MD for:  temperature >100.5  As directed    CAROTID Sugery: Call MD for difficulty swallowing or speaking; weakness in arms or legs that is a new symtom; severe headache.  If you have increased swelling in the neck and/or  are having difficulty breathing, CALL 911  As directed    Driving Restrictions  As directed    Comments:     No driving for 2 weeks   Increase activity slowly  As directed    Comments:     Walk with assistance use walker or cane as needed   Lifting restrictions  As directed    Comments:     No lifting for 4 weeks   May shower   As directed    Scheduling Instructions:     Sunday   may wash over wound with mild soap and water  As directed    No dressing needed  As directed    Resume previous diet  As directed        Medication List         amLODipine 5 MG tablet  Commonly known as:  NORVASC  Take 5 mg by mouth daily.     aspirin EC 81 MG tablet  Take 81 mg by mouth daily.     diazepam 5 MG tablet  Commonly known as:  VALIUM  Take 5 mg by mouth every 12 (twelve) hours as needed. Anxiety     fenofibrate micronized 134 MG capsule  Commonly known as:  LOFIBRA  Take 134 mg by mouth daily before breakfast.     fish oil-omega-3 fatty acids 1000 MG capsule  Take 1 g by mouth daily.     hydrochlorothiazide 12.5 MG capsule  Commonly known as:  MICROZIDE  Take 12.5 mg by mouth daily.     lisinopril 20 MG tablet  Commonly known as:  PRINIVIL,ZESTRIL  Take 20 mg by mouth daily.     metFORMIN 500 MG tablet  Commonly known as:  GLUCOPHAGE  Take 500 mg by mouth daily.     metoCLOPramide 10 MG tablet  Commonly known as:  REGLAN  Take 10 mg by mouth 2 (two) times daily.     omeprazole 20 MG capsule  Commonly known as:  PRILOSEC  Take 20 mg by mouth 2 (two) times daily before a meal.     oxyCODONE-acetaminophen 5-325 MG per tablet  Commonly known as:  ROXICET  Take 1-2 tablets by  mouth every 4 (four) hours as needed for severe pain.     pravastatin 40 MG tablet  Commonly known as:  PRAVACHOL  Take 40 mg by mouth daily.       Verbal and written Discharge instructions given to the patient. Wound care per Discharge AVS     Follow-up Information   Follow up with Trula Slade IV, [place], MD In 2 weeks. (office will arrange)    Specialty:  Vascular Surgery   Contact information:   [address] [place] [address] [phone]  Signed: Richrd Prime 03/06/2013, 11:16 AM  --- For VQI Registry use --- Instructions: Press F2 to tab through selections.  Delete question if not applicable.   Modified Rankin score at D/C (0-6): Rankin Score=0  IV medication needed for:  1. Hypertension: No 2. Hypotension: Yes dopamine- weaned POD#1  Post-op Complications: No  1. Post-op CVA or TIA: No    2. CN injury: No     3. Myocardial infarction: No   4.  CHF: No  5.  Dysrhythmia (new): No  6. Wound infection: No  7. Reperfusion symptoms: No  8. Return to OR: No   Discharge medications: Statin use:  Yes ASA use:  Yes Beta blocker use:  Yes ACE-Inhibitor use:  Yes P2Y12 Antagonist use: [x ] None, _0  Plavix, _1  Plasugrel, _2  Ticlopinine, _3  Ticagrelor, _4  Other, _5  No for medical reason, _6  Non-compliant, _7  Not-indicated Anti-coagulant use:  [x ] None, _8  Warfarin, _9  Rivaroxaban, _10  Dabigatran, _11  Other, _12  No for medical reason, _13  Non-compliant, _14  Not-indicated

## 2013-03-06 NOTE — Interval H&P Note (Signed)
History and Physical Interval Note:  03/06/2013 7:06 AM  Caitlin Oliver  has presented today for surgery, with the diagnosis of RIGHT INTERNAL CAROTID ARTERY STENOSIS  The various methods of treatment have been discussed with the patient and family. After consideration of risks, benefits and other options for treatment, the patient has consented to  Procedure(s): ENDARTERECTOMY CAROTID-RIGHT (Right) as a surgical intervention .  The patient's history has been reviewed, patient examined, no change in status, stable for surgery.  I have reviewed the patient's chart and labs.  Questions were answered to the patient's satisfaction.     Mavryk Pino IV, V. WELLS

## 2013-03-06 NOTE — Anesthesia Postprocedure Evaluation (Signed)
  Anesthesia Post-op Note  Patient: Caitlin Oliver  Procedure(s) Performed: Procedure(s): ENDARTERECTOMY CAROTID-RIGHT (Right) PATCH ANGIOPLASTY of Right Carotid Artery using Vascu-Guard Patch (Right)  Patient Location: PACU  Anesthesia Type:General  Level of Consciousness: awake, alert  and oriented  Airway and Oxygen Therapy: Patient Spontanous Breathing and Patient connected to nasal cannula oxygen  Post-op Pain: mild  Post-op Assessment: Post-op Vital signs reviewed, Patient's Cardiovascular Status Stable, Respiratory Function Stable, Patent Airway, No signs of Nausea or vomiting and Pain level controlled  Post-op Vital Signs: stable  Complications: No apparent anesthesia complications

## 2013-03-06 NOTE — Op Note (Signed)
Patient name: Caitlin Oliver MRN: [id number] DOB: Oct 27, 1940 Sex: female  03/06/2013 Pre-operative Diagnosis: Asymptomatic Right Carotid Stenosis Post-operative diagnosis:  Same Surgeon:  Eldridge Abrahams Assistants:  Wray Kearns Procedure:   Right Carotid Endarterectomy with patch angioplasty Anesthesia:  Gen Blood Loss:  See anesthesia record Specimens:  none  Findings:  Aneurysmal bulb.  90% stenosis  Indications:  Found to have bruit on exam.  U/S and angio reveal > 80 % bilateral stenosis.  Pt is asymptomatic   Findings:  90 %stenosis; Thrombus:  none   Procedure:  The patient was identified in the holding area and taken to [place] [facility]  The patient was then placed supine on the table.   General endotrachial anesthesia was administered.  The patient was prepped and draped in the usual sterile fashion.  A time out was called and antibiotics were administered.  The incision was made along the anterior border of the right sternocleidomastoid muscle.  Cautery was used to dissect through the subcutaneous tissue.  The platysma muscle was divided with cautery.  The internal jugular vein was exposed along its anterior medial border.  The common facial vein was exposed and then divided between 2-0 silk ties and metal clips.  The common carotid artery was then circumferentially exposed and encircled with an umbilical tape.  The vagus nerve was identified and protected.  Next sharp dissection was used to expose the external carotid artery and the superior thyroid artery.  The were encircled with a blue vessel loop and a 2-0 silk tie respectively.  Finally, the internal carotid was carefully dissected free.  An umbilical tape was placed around the internal carotid artery distal to the diseased segment.  The hypoglossal nerve was visualized throughout and protected.  The patient was given systemic heparinization.  A bovine carotid patch was selected and prepared on the back table.  A 10  french shunt was also prepared.  After blood pressure readings were appropriate and the heparin had been given time to circulate, the internal carotid artery was occluded with a baby Gregory clamp.  The external and common carotid arteries were then occluded with vascular clamps and the 2-0 tie tightened on the superior thyroid artery.  A #11 blade was used to make an arteriotomy in the common carotid artery.  This was extended with Potts scissors along the anterior and lateral border of the common and internal carotid artery.  Approximately 90% stenosis was identified.  There was no thrombus identified.  The 10 french shunt was not placed, because there was good backbleding. A kleiner kuntz elevator was used to perform endarterectomy.  An eversion endarterectomy was performed in the external carotid artery.  A good distal endpoint was obtained in the internal carotid artery.  The specimen was removed and sent to pathology.  Heparinized saline was used to irrigate the endarterectomized field.  All potential embolic debris was removed.  Bovine pericardial patch angioplasty was then performed using a running 6-0 Prolene. . The common internal and external carotid arteries were all appropriately flushed. The artery was again irrigated with heparin saline.  The anastomosis was then secured. The clamp was first released on the external carotid artery followed by the common carotid artery approximately 30 seconds later, bloodflow was reestablish through the internal carotid artery.  Next, a hand-held  Doppler was used to evaluate the signals in the common, external, and internal  carotid arteries, all of which had appropriate signals. I then administered  50 mg protamine. The wound was then irrigated.  After hemostasis was achieved, the carotid sheath was reapproximated with 3-0 Vicryl. The  platysma muscle was reapproximated with running 3-0 Vicryl. The skin  was closed with 4-0 Vicryl. Dermabond was placed on the  skin. The  patient was then successfully extubated. Her neurologic exam was  similar to his preprocedural exam. The patient was then taken to recovery room  in stable condition. There were no complications.     Disposition:  To PACU in stable condition.  Relevant Operative Details:  Aneurysmal bulb.  Normal hypoglossal location.  Good backbleeding, no shunt palced  V. Annamarie Major, M.D. [facility] [place] Office: [phone] Pager:  [phone]

## 2013-03-06 NOTE — Telephone Encounter (Addendum)
Message copied by Gena Fray on Fri Mar 06, 2013  3:08 PM ------      Message from: Richrd Prime      Created: Fri Mar 06, 2013  9:50 AM       2 week F/U carotid-[facility] ------  03/06/13: have tried to reach patient x2- no answer, no machine. Mailed letter, dpm

## 2013-03-06 NOTE — Anesthesia Procedure Notes (Signed)
Procedure Name: Intubation Date/Time: 03/06/2013 7:53 AM Performed by: Eligha Bridegroom Pre-anesthesia Checklist: Patient identified, Timeout performed, Emergency Drugs available, Suction available and Patient being monitored Patient Re-evaluated:Patient Re-evaluated prior to inductionOxygen Delivery Method: Circle system utilized Preoxygenation: Pre-oxygenation with 100% oxygen Intubation Type: IV induction Ventilation: Mask ventilation without difficulty Laryngoscope Size: Mac and 4 Grade View: Grade II Tube type: Oral Tube size: 7.0 mm Number of attempts: 1 Airway Equipment and Method: Stylet Secured at: 21 cm Tube secured with: Tape Dental Injury: Teeth and Oropharynx as per pre-operative assessment

## 2013-03-06 NOTE — Progress Notes (Signed)
Utilization review completed

## 2013-03-06 NOTE — OR Nursing (Signed)
Spoke with Dr. Linna Caprice (anesthesiologist) about low BP. In the process of given a 500cc LR IV fluid bolus. Instructed to give Albumin 250cc if SBP did not rise above 100.  Albumin given and SBP still not above 100. So order to start Dopamine at 28mg received. Dopmine started and SBP via aline has maintained above 100. Cuff pressure does not correlate and has been taking on the L arm. Cuff pressure on the R is 109/51 and aline is 117/47 which correlates much better

## 2013-03-06 NOTE — Anesthesia Preprocedure Evaluation (Signed)
Anesthesia Evaluation  Patient identified by MRN, date of birth, ID band Patient awake    Reviewed: Allergy & Precautions, H&P , NPO status   Airway Mallampati: II TM Distance: >3 FB Neck ROM: Full    Dental  (+) Teeth Intact and Dental Advisory Given   Pulmonary former smoker,  breath sounds clear to auscultation        Cardiovascular hypertension, Rhythm:Regular Rate:Normal     Neuro/Psych    GI/Hepatic   Endo/Other    Renal/GU      Musculoskeletal   Abdominal   Peds  Hematology   Anesthesia Other Findings   Reproductive/Obstetrics                           Anesthesia Physical Anesthesia Plan  ASA: III  Anesthesia Plan: General   Post-op Pain Management:    Induction: Intravenous  Airway Management Planned: Oral ETT  Additional Equipment:   Intra-op Plan:   Post-operative Plan: Extubation in OR  Informed Consent: I have reviewed the patients History and Physical, chart, labs and discussed the procedure including the risks, benefits and alternatives for the proposed anesthesia with the patient or authorized representative who has indicated his/her understanding and acceptance.   Dental advisory given  Plan Discussed with: CRNA and Anesthesiologist  Anesthesia Plan Comments: (Bilat carotid stenosis no history of CVA/TIA Smoker Htn Type 2 DM 105 Recent (-) nuclear stress test   Plan GA with oral ETT and art line  Roberts Gaudy, MD)        Anesthesia Quick Evaluation

## 2013-03-06 NOTE — Transfer of Care (Signed)
Immediate Anesthesia Transfer of Care Note  Patient: Caitlin Oliver  Procedure(s) Performed: Procedure(s): ENDARTERECTOMY CAROTID-RIGHT (Right) PATCH ANGIOPLASTY of Right Carotid Artery using Vascu-Guard Patch (Right)  Patient Location: PACU  Anesthesia Type:General  Level of Consciousness: awake, alert  and oriented  Airway & Oxygen Therapy: Patient Spontanous Breathing and Patient connected to nasal cannula oxygen  Post-op Assessment: Report given to PACU RN and Post -op Vital signs reviewed and stable  Post vital signs: Reviewed and stable  Complications: No apparent anesthesia complications

## 2013-03-07 LAB — BASIC METABOLIC PANEL
BUN: 8 mg/dL (ref 6–23)
Chloride: 102 mEq/L (ref 96–112)
Creatinine, Ser: 0.56 mg/dL (ref 0.50–1.10)
GFR calc Af Amer: >90 mL/min (ref >90)

## 2013-03-07 LAB — CBC
MCHC: 33.6 g/dL (ref 30.0–36.0)
MCV: 93.9 fL (ref 78.0–100.0)
Platelets: 259 10*3/uL (ref 150–400)
RBC: 4.12 MIL/uL (ref 3.87–5.11)
RDW: 12.6 % (ref 11.5–15.5)
WBC: 8.4 10*3/uL (ref 4.0–10.5)

## 2013-03-07 LAB — GLUCOSE, CAPILLARY: Glucose-Capillary: 104 mg/dL — ABNORMAL HIGH (ref 70–99)

## 2013-03-07 NOTE — Progress Notes (Signed)
Pt discharged home per MD order. All discharge instructions reviewed and all questions answered. Surgical site clean, dry, and intact.

## 2013-03-07 NOTE — Progress Notes (Signed)
   VASCULAR PROGRESS NOTE  SUBJECTIVE: No specific complaints.   PHYSICAL EXAM: Filed Vitals:   03/07/13 0615 03/07/13 0630 03/07/13 0635 03/07/13 0700  BP: 127/61   125/58  Pulse: 84 73 79 74  Temp:    98.8 F (37.1 C)  TempSrc:    Oral  Resp: _0 Height:      Weight:      SpO2: 93% 94% 93% 93%   Incisions looks fine. Mild swelling. Neuro intact. Some marginal mandibular nerve dysfunction.   LABS: Lab Results  Component Value Date   WBC 8.4 03/07/2013   HGB 13.0 03/07/2013   HCT 38.7 03/07/2013   MCV 93.9 03/07/2013   PLT 259 03/07/2013   Lab Results  Component Value Date   CREATININE 0.56 03/07/2013   Lab Results  Component Value Date   INR 0.92 03/03/2013   CBG (last 3)   Recent Labs  03/06/13 1308 03/06/13 1557 03/06/13 2122  GLUCAP 114* 116* 110*   ASSESSMENT AND PLAN:  * Doing well s/p Right CEA.  * D/C today on her home meds which include ASA and a statin (Pravachol).  Gae Gallop Beeper: [phone] 03/07/2013

## 2013-03-07 NOTE — Progress Notes (Addendum)
VASCULAR AND VEIN SURGERY POST - OP CEA PROGRESS NOTE  Date of Surgery: 03/06/2013  Surgeon(s): Serafina Mitchell, MD 1 Day Post-Op right CEA .  HPI: Caitlin Oliver is a 72 y.o. female who is 1 Day Post-Op . Patient is doing well.  Patient reports mild headache; Patient denies difficulty swallowing; denies weakness in upper or lower extremities; Pt. denies other symptoms of stroke or TIA.  Significant Diagnostic Studies: CBC Lab Results  Component Value Date   WBC 8.4 03/07/2013   HGB 13.0 03/07/2013   HCT 38.7 03/07/2013   MCV 93.9 03/07/2013   PLT 259 03/07/2013    BMET    Component Value Date/Time   NA 135 03/07/2013 0500   K 3.8 03/07/2013 0500   CL 102 03/07/2013 0500   CO2 24 03/07/2013 0500   GLUCOSE 107* 03/07/2013 0500   BUN 8 03/07/2013 0500   CREATININE 0.56 03/07/2013 0500   CREATININE 0.70 02/18/2013 1348   CALCIUM 8.6 03/07/2013 0500   GFRNONAA >90 03/07/2013 0500   GFRAA >90 03/07/2013 0500    COAG Lab Results  Component Value Date   INR 0.92 03/03/2013   INR 0.93 02/18/2013   INR 0.93 11/19/2011   No results found for this basename: PTT      Intake/Output Summary (Last 24 hours) at 03/07/13 0753 Last data filed at 03/07/13 0537  Gross per 24 hour  Intake   1870 ml  Output   1425 ml  Net    445 ml    Physical Exam:  BP Readings from Last 3 Encounters:  03/07/13 125/58  03/07/13 125/58  03/03/13 123/74   Temp Readings from Last 3 Encounters:  03/07/13 98.8 F (37.1 C) Oral  03/07/13 98.8 F (37.1 C) Oral  03/03/13 97.9 F (36.6 C)    SpO2 Readings from Last 3 Encounters:  03/07/13 93%  03/07/13 93%  03/03/13 96%   Pulse Readings from Last 3 Encounters:  03/07/13 74  03/07/13 74  03/03/13 81    Pt is A&O x 3 Gait is normal Speech is fluent right Neck Wound is healing well Patient with Negative tongue deviation and Negative facial droop Pt has good and equal strength in all extremities  Assessment/Plan:: Caitlin Oliver  is a 72 y.o. female is S/P Right CEA Pt is voiding, ambulating and taking po well Post-op hypotension resolved - off dopamine   Discharge to: Home Follow-up in 2 weeks   Rutledge J  03/07/2013 7:53 AM  Agree with above.   Deitra Mayo, MD, [place] [phone] 03/07/2013

## 2013-03-10 ENCOUNTER — Encounter (HOSPITAL_COMMUNITY): Payer: Self-pay | Admitting: Surgery

## 2013-03-11 ENCOUNTER — Encounter: Payer: Self-pay | Admitting: Surgery

## 2013-03-11 ENCOUNTER — Telehealth: Payer: Self-pay

## 2013-03-11 DIAGNOSIS — Z48812 Encounter for surgical aftercare following surgery on the circulatory system: Secondary | ICD-10-CM

## 2013-03-11 DIAGNOSIS — R51 Headache: Secondary | ICD-10-CM

## 2013-03-11 MED ORDER — TRAMADOL HCL 50 MG PO TABS: 50.0000 mg | ORAL_TABLET | Freq: Four times a day (QID) | ORAL | Status: DC | PRN

## 2013-03-11 NOTE — Discharge Summary (Signed)
I agree with the above  Caitlin Oliver 

## 2013-03-11 NOTE — Telephone Encounter (Signed)
Phone call from pt.  States the Percocet given at the hospital before she was discharged made her "feel nuts..I couldn't talk, and I got shakey."  Stated "this lasted until about 9:00 PM after I got home from the hospital."  C/o intermittent headache; rates at a 4/10.  Denies any vision changes, weakness, difficulty with speech, or with balance.  States "I feel real tired."  Is requesting an alternate pain medication, instead of the Percocet.  Stated she didn't get the Percocet Rx filled.  Has been taking Bryant, but said it hasn't been helping the headache pain.  Advised pt. will call in Tramadol per standing order.  Pt. Has a f/u appt. with Dr. Trula Slade on 12/1.  Encouraged to call back if her headache worsens/ not relieved with Tramadol; knows to call the answering service, over the holiday weekend, to speak with the doctor on call.  Pt. Verb. understanding.

## 2013-03-16 ENCOUNTER — Encounter: Payer: Self-pay | Admitting: Surgery

## 2013-03-16 ENCOUNTER — Ambulatory Visit (INDEPENDENT_AMBULATORY_CARE_PROVIDER_SITE_OTHER): Payer: Self-pay | Admitting: Surgery

## 2013-03-16 DIAGNOSIS — I6529 Occlusion and stenosis of unspecified carotid artery: Secondary | ICD-10-CM

## 2013-03-16 NOTE — Progress Notes (Signed)
The patient is back today for followup.  She is status post right carotid endarterectomy with bovine pericardial patch angioplasty.  This was done in the setting of a symptomatic right carotid stenosis which was confirmed with angiography.  Her postoperative course was uncomplicated.  Intraoperative findings revealed 90% stenosis within aneurysmal area within her carotid bulb.  Her only complaint is that of some right facial droop.  On examination her incision is well-healed.  She is neurologically intact.  Her tongue is midline.  The patient is scheduled to come back in 4 weeks as she has a greater than 80% left carotid stenosis, defined by angiography.  I am going to have her see your nose and throat to have her vocal cords evaluated between now and then.  In 4 weeks we will schedule her operative time for left carotid endarterectomy assuming she has had continued uncomplicated course.

## 2013-03-16 NOTE — Progress Notes (Signed)
In response to the coding query regarding carotid angiography, I did place JB1 catheters in each carotid artery to selectively visualize them.  Lorretta Harp, M.D., [place], [facility], [place]ver[place] [address] [place], [place]  [address]  [phone] 03/16/2013 4:33 PM

## 2013-03-17 ENCOUNTER — Telehealth: Payer: Self-pay | Admitting: Surgery

## 2013-03-17 NOTE — Addendum Note (Signed)
Addended by: Dorthula Rue L on: 03/17/2013 04:42 PM   Modules accepted: Orders

## 2013-03-17 NOTE — Telephone Encounter (Signed)
Gave pt appt info for [facility]  03/23/13  9:45 am with dr. Claiborne Rigg.

## 2013-04-08 ENCOUNTER — Encounter: Payer: Self-pay | Admitting: Surgery

## 2013-04-13 ENCOUNTER — Encounter: Payer: Self-pay | Admitting: Surgery

## 2013-04-13 ENCOUNTER — Ambulatory Visit (INDEPENDENT_AMBULATORY_CARE_PROVIDER_SITE_OTHER): Payer: Medicare Other | Admitting: Surgery

## 2013-04-13 DIAGNOSIS — I6529 Occlusion and stenosis of unspecified carotid artery: Secondary | ICD-10-CM

## 2013-04-13 NOTE — Progress Notes (Signed)
Patient name: Caitlin Oliver MRN: [id number] DOB: 03/12/1941 Sex: female     Chief Complaint  Patient presents with  . Re-evaluation    4 wk f/u with ENT visit prior    HISTORY OF PRESENT ILLNESS: This is a very pleasant 72 year old female referred to me by Dr. Gwenlyn Found for evaluation of carotid stenosis, bilateral. The patient was identified with a carotid bruit. Ultrasound revealed greater than 70% bilateral carotid stenosis. This was further confirmed with angiography. Both sides were greater than 85%.  She underwent right carotid endarterectomy on 03/06/2013.  Her postoperative course was uncomplicated.  She was recently evaluated by ear nose and throat and found to have normal vocal cord motion.  She has no symptoms related to her left carotid stenosis.  The patient continues to be medically managed for diabetes hypercholesterolemia and hypertension.  She is on a statin as well as an ACE inhibitor.   Past Medical History  Diagnosis Date  . Hypertension   . Hypercholesteremia   . Diabetes mellitus   . Anxiety   . Stomach ulcer   . Claudication   . Tobacco abuse   . Carotid artery disease   . Peripheral arterial disease     bilateral iliac artery stenosis by angiography  . COPD (chronic obstructive pulmonary disease)   . Shortness of breath     with exertion  . GERD (gastroesophageal reflux disease)     Past Surgical History  Procedure Laterality Date  . Tubal ligation    . Abdominal hysterectomy    . Breast reduction surgery    . Appendectomy    . Salivary gland surgery      scar tissue removed from left saliva glad  . Endarterectomy Right 03/06/2013    Procedure: ENDARTERECTOMY CAROTID-RIGHT;  Surgeon: Serafina Mitchell, MD;  Location: [facility];  Service: Vascular;  Laterality: Right;  . Patch angioplasty Right 03/06/2013    Procedure: PATCH ANGIOPLASTY of Right Carotid Artery using Vascu-Guard Patch;  Surgeon: Serafina Mitchell, MD;  Location: [facility];  Service: Vascular;   Laterality: Right;    History   Social History  . Marital Status: Widowed    Spouse Name: N/A    Number of Children: N/A  . Years of Education: N/A   Occupational History  . Not on file.   Social History Main Topics  . Smoking status: Former Smoker -- 1.50 packs/day for 56 years    Types: Cigarettes  . Smokeless tobacco: Former Systems developer    Quit date: 02/25/2013  . Alcohol Use: No  . Drug Use: No  . Sexual Activity: Not on file   Other Topics Concern  . Not on file   Social History Narrative  . No narrative on file    Family History  Problem Relation Age of Onset  . Stroke Mother   . Hypertension Mother   . Kidney failure Father     Allergies as of 04/13/2013 - Review Complete 04/13/2013  Allergen Reaction Noted  . Estrace [estradiol] Swelling 11/19/2011    Current Outpatient Prescriptions on File Prior to Visit  Medication Sig Dispense Refill  . amLODipine (NORVASC) 5 MG tablet Take 5 mg by mouth daily.      [name] aspirin EC 81 MG tablet Take 81 mg by mouth daily.      . diazepam (VALIUM) 5 MG tablet Take 5 mg by mouth every 12 (twelve) hours as needed. Anxiety      . fenofibrate micronized (LOFIBRA) 134  MG capsule Take 134 mg by mouth daily before breakfast.      . fish oil-omega-3 fatty acids 1000 MG capsule Take 1 g by mouth daily.      . hydrochlorothiazide (MICROZIDE) 12.5 MG capsule Take 12.5 mg by mouth daily.      [name] lisinopril (PRINIVIL,ZESTRIL) 20 MG tablet Take 20 mg by mouth daily.      . metFORMIN (GLUCOPHAGE) 500 MG tablet Take 500 mg by mouth daily.       . metoCLOPramide (REGLAN) 10 MG tablet Take 10 mg by mouth 2 (two) times daily.       [name] omeprazole (PRILOSEC) 20 MG capsule Take 20 mg by mouth 2 (two) times daily before a meal.       . oxyCODONE-acetaminophen (ROXICET) 5-325 MG per tablet Take 1-2 tablets by mouth every 4 (four) hours as needed for severe pain.  30 tablet  0  . pravastatin (PRAVACHOL) 40 MG tablet Take 40 mg by mouth daily.      .  traMADol (ULTRAM) 50 MG tablet Take 1 tablet (50 mg total) by mouth every 6 (six) hours as needed.  20 tablet  0   No current facility-administered medications on file prior to visit.     REVIEW OF SYSTEMS: No changes from prior visit  PHYSICAL EXAMINATION:   Vital signs are BP 101/63  Pulse 82  Ht _0  (1.626 m)  Wt 165 lb 4.8 oz (74.98 kg)  BMI 28.36 kg/m2  SpO2 97% General: The patient appears their stated age. HEENT:  No gross abnormalities Pulmonary:  Non labored breathing Musculoskeletal: There are no major deformities. Neurologic: No focal weakness or paresthesias are detected, Skin: There are no ulcer or rashes noted. Psychiatric: The patient has normal affect. Cardiovascular: There is a regular rate and rhythm without significant murmur appreciated.   Diagnostic Studies None  Assessment: Asymptomatic left carotid stenosis Plan: The patient will be scheduled for left carotid endarterectomy on Thursday, January 22.  The patient is well aware of the risks and benefits including the risk of nerve injury, bleeding, and stroke.  Eldridge Abrahams, M.D. [facility] Office: [phone] Pager:  [phone]

## 2013-04-22 ENCOUNTER — Other Ambulatory Visit: Payer: Self-pay | Admitting: *Deleted

## 2013-04-22 ENCOUNTER — Encounter: Payer: Self-pay | Admitting: *Deleted

## 2013-04-23 ENCOUNTER — Encounter (HOSPITAL_COMMUNITY): Payer: Self-pay

## 2013-04-28 ENCOUNTER — Encounter: Payer: Self-pay | Admitting: Cardiovascular Disease

## 2013-04-28 ENCOUNTER — Ambulatory Visit (INDEPENDENT_AMBULATORY_CARE_PROVIDER_SITE_OTHER): Payer: Medicare Other | Admitting: Cardiovascular Disease

## 2013-04-28 VITALS — BP 126/60 | HR 92 | Ht 64.0 in | Wt 167.0 lb

## 2013-04-28 DIAGNOSIS — I779 Disorder of arteries and arterioles, unspecified: Secondary | ICD-10-CM

## 2013-04-28 DIAGNOSIS — I10 Essential (primary) hypertension: Secondary | ICD-10-CM

## 2013-04-28 DIAGNOSIS — F17200 Nicotine dependence, unspecified, uncomplicated: Secondary | ICD-10-CM

## 2013-04-28 DIAGNOSIS — E785 Hyperlipidemia, unspecified: Secondary | ICD-10-CM

## 2013-04-28 DIAGNOSIS — Z720 Tobacco use: Secondary | ICD-10-CM

## 2013-04-28 DIAGNOSIS — I739 Peripheral vascular disease, unspecified: Secondary | ICD-10-CM

## 2013-04-28 NOTE — Assessment & Plan Note (Signed)
Well-controlled on current medications

## 2013-04-28 NOTE — Pre-Procedure Instructions (Signed)
IRIE DOWSON  04/28/2013   Your procedure is scheduled on: Thursday, January 22.  Report to [facility], Main Entrance / Entrance "A" at 5:30AM.  Call this number if you have problems the morning of surgery: [phone]   Remember:   Do not eat food or drink liquids after midnight, Wednesday, January 21.   Take these medicines the morning of surgery with A SIP OF WATER: Amlodipine, Metoclopramide, Omeprazole.              Take if needed: Valium and Pain Medication.   Do not wear jewelry, make-up or nail polish.  Do not wear lotions, powders, or perfumes. You may wear deodorant.  Do not shave 48 hours prior to surgery.   Do not bring valuables to the hospital.  [facility] is not responsible for any belongings or valuables.               Contacts, dentures or bridgework may not be worn into surgery.  Leave suitcase in the car. After surgery it may be brought to your room.  For patients admitted to the hospital, discharge time is determined by your treatment team.                 Special Instructions: Shower using CHG 2 nights before surgery and the night before surgery.  If you shower the day of surgery use CHG.  Use special wash - you have one bottle of CHG for all showers.  You should use approximately 1/3 of the bottle for each shower.   Please read over the following fact sheets that you were given: Pain Booklet, Coughing and Deep Breathing, Blood Transfusion Information and Surgical Site Infection Prevention

## 2013-04-28 NOTE — Assessment & Plan Note (Signed)
On statin therapy followed by her PCP 

## 2013-04-28 NOTE — Assessment & Plan Note (Signed)
She stopped smoking in November of last year at my request

## 2013-04-28 NOTE — Progress Notes (Signed)
04/28/2013 Caitlin Oliver   1941-04-07  [id number]  Primary Physician Bing Matter, PA-C Primary Cardiologist: Lorretta Harp MD [name]   HPI:  Ms. Druck is a 73 year old mildly overweight widowed Caucasian female no children (son died 7 years ago) referred by Emmie Niemann PA-C at [facility] for evaluation and treatment of lifestyle limiting claudication. Risk factors included hypertension, hyperlipidemia and type 2 diabetes. She has a 75-pack-year history of tobacco abuse smoking one and a half packs a day. There's no family history of heart disease. She's never had a heart for stroke. She does complain of dyspnea but denies chest pain. She complains of bilateral lower extremity claudication when walking up an incline which is lifestyle limiting. Dopplers performed showed mildly decreased ABIs with high-frequency signals in both iliac arteries. She also has high-grade carotid disease by duplex ultrasound. I performed angiography her carotids and lower extremities revealing high-grade bilateral ICA stenosis which will need surgical revascularization as well as high grade bilateral iliac disease. She underwent uncomplicated right carotid endarterectomy on 03/06/13 by Dr. Trula Slade and is scheduled for staged left carotid endarterectomy on 05/07/13. Once her carotids are dressed we will need to focus on her lower extremities performed on her back with rotational atherectomy on her calcified ostial iliac lesions. She has stopped smoking at my request in November.    Current Outpatient Prescriptions  Medication Sig Dispense Refill  . amLODipine (NORVASC) 5 MG tablet Take 5 mg by mouth daily.      [name] amoxicillin (AMOXIL) 875 MG tablet Take 875 mg by mouth 2 (two) times daily.       [name] aspirin EC 81 MG tablet Take 81 mg by mouth daily.      . diazepam (VALIUM) 5 MG tablet Take 5 mg by mouth every 12 (twelve) hours as needed. Anxiety      .  fenofibrate micronized (LOFIBRA) 134 MG capsule Take 134 mg by mouth daily before breakfast.      . fish oil-omega-3 fatty acids 1000 MG capsule Take 1 g by mouth daily.      . hydrochlorothiazide (MICROZIDE) 12.5 MG capsule Take 12.5 mg by mouth daily.      [name] lisinopril (PRINIVIL,ZESTRIL) 20 MG tablet Take 20 mg by mouth daily.      . metFORMIN (GLUCOPHAGE) 500 MG tablet Take 500 mg by mouth daily.       . metoCLOPramide (REGLAN) 10 MG tablet Take 10 mg by mouth 2 (two) times daily.       [name] omeprazole (PRILOSEC) 20 MG capsule Take 20 mg by mouth 2 (two) times daily before a meal.       . oxyCODONE-acetaminophen (PERCOCET/ROXICET) 5-325 MG per tablet       . pravastatin (PRAVACHOL) 40 MG tablet Take 40 mg by mouth daily.      . traMADol (ULTRAM) 50 MG tablet Take 1 tablet (50 mg total) by mouth every 6 (six) hours as needed.  20 tablet  0   No current facility-administered medications for this visit.    Allergies  Allergen Reactions  . Estrace [Estradiol] Swelling    Has allergy to patch, not the pill    History   Social History  . Marital Status: Widowed    Spouse Name: N/A    Number of Children: N/A  . Years of Education: N/A   Occupational History  . Not on file.   Social History Main Topics  . Smoking status: Former Smoker --  1.50 packs/day for 56 years    Types: Cigarettes  . Smokeless tobacco: Former Systems developer    Quit date: 02/25/2013  . Alcohol Use: No  . Drug Use: No  . Sexual Activity: Not on file   Other Topics Concern  . Not on file   Social History Narrative  . No narrative on file     Review of Systems: General: negative for chills, fever, night sweats or weight changes.  Cardiovascular: negative for chest pain, dyspnea on exertion, edema, orthopnea, palpitations, paroxysmal nocturnal dyspnea or shortness of breath Dermatological: negative for rash Respiratory: negative for cough or wheezing Urologic: negative for hematuria Abdominal: negative for nausea,  vomiting, diarrhea, bright red blood per rectum, melena, or hematemesis Neurologic: negative for visual changes, syncope, or dizziness All other systems reviewed and are otherwise negative except as noted above.    Blood pressure 126/60, pulse 92, height _0  (1.626 m), weight 167 lb (75.751 kg).  General appearance: alert and no distress Neck: no adenopathy, no JVD, supple, symmetrical, trachea midline, thyroid not enlarged, symmetric, no tenderness/mass/nodules and bilateral carotid bruits Lungs: clear to auscultation bilaterally Heart: regular rate and rhythm, S1, S2 normal, no murmur, click, rub or gallop Extremities: extremities normal, atraumatic, no cyanosis or edema  EKG not performed today  ASSESSMENT AND PLAN:   Carotid artery disease The patient was sent to me for claudication by . Liberty PAC  at [facility] [place]. During evaluation carotid Dopplers were obtained and were markedly abnormal and at the time of angiography 02/25/13 I performed carotid angiography that revealed 95-99% proximal bilateral internal carotid artery stenosis. She underwent uncomplicated right carotid endarterectomy by Dr. Trula Slade on  03/06/13 and is scheduled for staged left carotid endarterectomy on 05/07/13. We will follow her carotid Dopplers after her left carotid endarterectomy here in our office.  Claudication Her lower extremity arterial Dopplers suggested high-frequency signals in both iliac arteries with decreased ABIs and lifestyle limiting claudication. Angiography revealed 95% calcified ostial bilateral common iliac artery stenoses as well as a 90% calcified right common femoral artery stenosis with a 70 mm pullback gradient. Lead diamondback orbital rotational atherectomy, PT and stenting for revascularization given the calcified nature of her disease  Hyperlipidemia On statin therapy followed by her PCP  Tobacco abuse She stopped smoking in November of last  year at my request  Essential hypertension Well-controlled on current medications      Lorretta Harp MD [facility] 04/28/2013 12:51 PM

## 2013-04-28 NOTE — Assessment & Plan Note (Signed)
The patient was sent to me for claudication by . Caitlin Oliver PAC  at [facility] [place]. During evaluation carotid Dopplers were obtained and were markedly abnormal and at the time of angiography 02/25/13 I performed carotid angiography that revealed 95-99% proximal bilateral internal carotid artery stenosis. She underwent uncomplicated right carotid endarterectomy by Dr. Trula Slade on  03/06/13 and is scheduled for staged left carotid endarterectomy on 05/07/13. We will follow her carotid Dopplers after her left carotid endarterectomy here in our office.

## 2013-04-28 NOTE — Patient Instructions (Signed)
Follow up with Dr Gwenlyn Found in 6-8 weeks to discuss your legs

## 2013-04-28 NOTE — Assessment & Plan Note (Signed)
Her lower extremity arterial Dopplers suggested high-frequency signals in both iliac arteries with decreased ABIs and lifestyle limiting claudication. Angiography revealed 95% calcified ostial bilateral common iliac artery stenoses as well as a 90% calcified right common femoral artery stenosis with a 70 mm pullback gradient. Lead diamondback orbital rotational atherectomy, PT and stenting for revascularization given the calcified nature of her disease

## 2013-04-29 ENCOUNTER — Inpatient Hospital Stay (HOSPITAL_COMMUNITY)
Admission: RE | Admit: 2013-04-29 | Discharge: 2013-04-29 | Disposition: A | Discharge status: Home / Self Care | Payer: Medicare Other | Source: Ambulatory Visit

## 2013-04-30 ENCOUNTER — Encounter (HOSPITAL_COMMUNITY)
Admission: RE | Admit: 2013-04-30 | Discharge: 2013-04-30 | Disposition: A | Discharge status: Home / Self Care | Payer: Medicare Other | Source: Ambulatory Visit | Attending: Surgery | Admitting: Surgery

## 2013-04-30 ENCOUNTER — Encounter (HOSPITAL_COMMUNITY): Payer: Self-pay

## 2013-04-30 DIAGNOSIS — Z01812 Encounter for preprocedural laboratory examination: Secondary | ICD-10-CM | POA: Insufficient documentation

## 2013-04-30 DIAGNOSIS — Z01818 Encounter for other preprocedural examination: Secondary | ICD-10-CM | POA: Insufficient documentation

## 2013-04-30 LAB — URINALYSIS, ROUTINE W REFLEX MICROSCOPIC
BILIRUBIN URINE: NEGATIVE
GLUCOSE, UA: NEGATIVE mg/dL
HGB URINE DIPSTICK: NEGATIVE
KETONES UR: NEGATIVE mg/dL
Leukocytes, UA: NEGATIVE
Nitrite: NEGATIVE
Protein, ur: NEGATIVE mg/dL
Specific Gravity, Urine: 1.007 (ref 1.005–1.030)
Urobilinogen, UA: 0.2 mg/dL (ref 0.0–1.0)
pH: 6 (ref 5.0–8.0)

## 2013-04-30 LAB — CBC
HCT: 46 % (ref 36.0–46.0)
Hemoglobin: 15.5 g/dL — ABNORMAL HIGH (ref 12.0–15.0)
MCH: 31.5 pg (ref 26.0–34.0)
MCHC: 33.7 g/dL (ref 30.0–36.0)
MCV: 93.5 fL (ref 78.0–100.0)
PLATELETS: 339 10*3/uL (ref 150–400)
RBC: 4.92 MIL/uL (ref 3.87–5.11)
RDW: 13.4 % (ref 11.5–15.5)
WBC: 7.6 10*3/uL (ref 4.0–10.5)

## 2013-04-30 LAB — COMPREHENSIVE METABOLIC PANEL
ALBUMIN: 4 g/dL (ref 3.5–5.2)
ALT: 13 U/L (ref 0–35)
AST: 20 U/L (ref 0–37)
Alkaline Phosphatase: 88 U/L (ref 39–117)
BUN: 19 mg/dL (ref 6–23)
CHLORIDE: 101 meq/L (ref 96–112)
CO2: 28 meq/L (ref 19–32)
CREATININE: 0.71 mg/dL (ref 0.50–1.10)
Calcium: 9.5 mg/dL (ref 8.4–10.5)
GFR calc Af Amer: >90 mL/min (ref >90)
GFR, EST NON AFRICAN AMERICAN: 84 mL/min — AB (ref >90)
Glucose, Bld: 95 mg/dL (ref 70–99)
POTASSIUM: 4.7 meq/L (ref 3.7–5.3)
SODIUM: 141 meq/L (ref 137–147)
Total Bilirubin: <0.2 mg/dL — ABNORMAL LOW (ref 0.3–1.2)
Total Protein: 7.1 g/dL (ref 6.0–8.3)

## 2013-04-30 LAB — SURGICAL PCR SCREEN
MRSA, PCR: NEGATIVE
Staphylococcus aureus: NEGATIVE

## 2013-04-30 LAB — APTT: aPTT: 31 seconds (ref 24–37)

## 2013-04-30 LAB — TYPE AND SCREEN
ABO/RH(D): A POS
Antibody Screen: NEGATIVE

## 2013-04-30 LAB — PROTIME-INR
INR: 0.96 (ref 0.00–1.49)
PROTHROMBIN TIME: 12.6 s (ref 11.6–15.2)

## 2013-05-06 ENCOUNTER — Encounter (HOSPITAL_COMMUNITY): Payer: Self-pay | Admitting: Certified Registered Nurse Anesthetist

## 2013-05-06 MED ORDER — DEXTROSE 5 % IV SOLN
1.5000 g | INTRAVENOUS | Status: AC
  Administered 2013-05-07: 1.5 g via INTRAVENOUS
  Filled 2013-05-06: qty 1.5

## 2013-05-06 MED ORDER — SODIUM CHLORIDE 0.9 % IV SOLN: INTRAVENOUS | Status: DC

## 2013-05-06 NOTE — Progress Notes (Signed)
Notified pt. Of time change. Instructed pt. To arrive at 0730.

## 2013-05-07 ENCOUNTER — Encounter (HOSPITAL_COMMUNITY)
Admission: RE | Disposition: A | Discharge status: Home / Self Care | Payer: Self-pay | Source: Ambulatory Visit | Attending: Surgery

## 2013-05-07 ENCOUNTER — Inpatient Hospital Stay (HOSPITAL_COMMUNITY)
Admission: RE | Admit: 2013-05-07 | Discharge: 2013-05-08 | DRG: 039 | Disposition: A | Discharge status: Home / Self Care | Payer: Medicare Other | Source: Ambulatory Visit | Attending: Surgery | Admitting: Surgery

## 2013-05-07 ENCOUNTER — Inpatient Hospital Stay (HOSPITAL_COMMUNITY): Payer: Medicare Other | Admitting: Certified Registered Nurse Anesthetist

## 2013-05-07 ENCOUNTER — Telehealth: Payer: Self-pay | Admitting: Surgery

## 2013-05-07 ENCOUNTER — Encounter (HOSPITAL_COMMUNITY): Payer: Medicare Other | Admitting: Certified Registered Nurse Anesthetist

## 2013-05-07 ENCOUNTER — Encounter (HOSPITAL_COMMUNITY): Payer: Self-pay | Admitting: *Deleted

## 2013-05-07 DIAGNOSIS — Z87891 Personal history of nicotine dependence: Secondary | ICD-10-CM

## 2013-05-07 DIAGNOSIS — I6529 Occlusion and stenosis of unspecified carotid artery: Secondary | ICD-10-CM

## 2013-05-07 DIAGNOSIS — J4489 Other specified chronic obstructive pulmonary disease: Secondary | ICD-10-CM | POA: Diagnosis present

## 2013-05-07 DIAGNOSIS — Z7982 Long term (current) use of aspirin: Secondary | ICD-10-CM

## 2013-05-07 DIAGNOSIS — K219 Gastro-esophageal reflux disease without esophagitis: Secondary | ICD-10-CM | POA: Diagnosis present

## 2013-05-07 DIAGNOSIS — E119 Type 2 diabetes mellitus without complications: Secondary | ICD-10-CM | POA: Diagnosis present

## 2013-05-07 DIAGNOSIS — I739 Peripheral vascular disease, unspecified: Secondary | ICD-10-CM | POA: Diagnosis present

## 2013-05-07 DIAGNOSIS — E7800 Pure hypercholesterolemia, unspecified: Secondary | ICD-10-CM | POA: Diagnosis present

## 2013-05-07 DIAGNOSIS — J449 Chronic obstructive pulmonary disease, unspecified: Secondary | ICD-10-CM | POA: Diagnosis present

## 2013-05-07 DIAGNOSIS — I6522 Occlusion and stenosis of left carotid artery: Secondary | ICD-10-CM | POA: Insufficient documentation

## 2013-05-07 DIAGNOSIS — F411 Generalized anxiety disorder: Secondary | ICD-10-CM | POA: Diagnosis present

## 2013-05-07 DIAGNOSIS — I10 Essential (primary) hypertension: Secondary | ICD-10-CM | POA: Diagnosis present

## 2013-05-07 HISTORY — PX: ENDARTERECTOMY: SHX5162

## 2013-05-07 LAB — CBC
HCT: 40.7 % (ref 36.0–46.0)
Hemoglobin: 13.8 g/dL (ref 12.0–15.0)
MCH: 31.2 pg (ref 26.0–34.0)
MCHC: 33.9 g/dL (ref 30.0–36.0)
MCV: 92.1 fL (ref 78.0–100.0)
PLATELETS: 275 10*3/uL (ref 150–400)
RBC: 4.42 MIL/uL (ref 3.87–5.11)
RDW: 13.4 % (ref 11.5–15.5)
WBC: 13.4 10*3/uL — ABNORMAL HIGH (ref 4.0–10.5)

## 2013-05-07 LAB — GLUCOSE, CAPILLARY
GLUCOSE-CAPILLARY: 89 mg/dL (ref 70–99)
Glucose-Capillary: 128 mg/dL — ABNORMAL HIGH (ref 70–99)
Glucose-Capillary: 109 mg/dL — ABNORMAL HIGH (ref 70–99)

## 2013-05-07 LAB — CREATININE, SERUM
CREATININE: 0.51 mg/dL (ref 0.50–1.10)
GFR calc Af Amer: >90 mL/min (ref >90)

## 2013-05-07 SURGERY — ENDARTERECTOMY, CAROTID
Anesthesia: General | Site: Neck | Laterality: Left

## 2013-05-07 MED ORDER — DOCUSATE SODIUM 100 MG PO CAPS
100.0000 mg | ORAL_CAPSULE | Freq: Every day | ORAL | Status: DC
  Administered 2013-05-08: 100 mg via ORAL
  Filled 2013-05-07: qty 1

## 2013-05-07 MED ORDER — HEMOSTATIC AGENTS (NO CHARGE) OPTIME
TOPICAL | Status: DC | PRN
  Administered 2013-05-07: 1 via TOPICAL

## 2013-05-07 MED ORDER — INSULIN ASPART 100 UNIT/ML ~~LOC~~ SOLN
0.0000 [IU] | Freq: Three times a day (TID) | SUBCUTANEOUS | Status: DC
  Administered 2013-05-08: 2 [IU] via SUBCUTANEOUS

## 2013-05-07 MED ORDER — SODIUM CHLORIDE 0.9 % IR SOLN
Status: DC | PRN
  Administered 2013-05-07: 11:00:00

## 2013-05-07 MED ORDER — OXYCODONE HCL 5 MG/5ML PO SOLN: 5.0000 mg | Freq: Once | ORAL | Status: DC | PRN

## 2013-05-07 MED ORDER — DIAZEPAM 5 MG PO TABS: 5.0000 mg | ORAL_TABLET | Freq: Two times a day (BID) | ORAL | Status: DC | PRN

## 2013-05-07 MED ORDER — PROPOFOL 10 MG/ML IV BOLUS
INTRAVENOUS | Status: DC | PRN
  Administered 2013-05-07: 100 mg via INTRAVENOUS

## 2013-05-07 MED ORDER — LIDOCAINE HCL (CARDIAC) 20 MG/ML IV SOLN
INTRAVENOUS | Status: AC
  Filled 2013-05-07: qty 5

## 2013-05-07 MED ORDER — LACTATED RINGERS IV SOLN
INTRAVENOUS | Status: DC | PRN
  Administered 2013-05-07 (×3): via INTRAVENOUS

## 2013-05-07 MED ORDER — SODIUM CHLORIDE 0.9 % IJ SOLN
INTRAMUSCULAR | Status: AC
  Filled 2013-05-07: qty 10

## 2013-05-07 MED ORDER — METFORMIN HCL 500 MG PO TABS
500.0000 mg | ORAL_TABLET | Freq: Every day | ORAL | Status: DC
  Administered 2013-05-08: 500 mg via ORAL
  Filled 2013-05-07 (×2): qty 1

## 2013-05-07 MED ORDER — LISINOPRIL 20 MG PO TABS
20.0000 mg | ORAL_TABLET | Freq: Every day | ORAL | Status: DC
  Administered 2013-05-08: 20 mg via ORAL
  Filled 2013-05-07: qty 1

## 2013-05-07 MED ORDER — PHENYLEPHRINE HCL 10 MG/ML IJ SOLN
10.0000 mg | INTRAVENOUS | Status: DC | PRN
  Administered 2013-05-07: 25 ug/min via INTRAVENOUS

## 2013-05-07 MED ORDER — BISACODYL 10 MG RE SUPP: 10.0000 mg | Freq: Every day | RECTAL | Status: DC | PRN

## 2013-05-07 MED ORDER — HYDRALAZINE HCL 20 MG/ML IJ SOLN
10.0000 mg | INTRAMUSCULAR | Status: DC | PRN
Start: 2013-05-07 — End: 2013-05-08

## 2013-05-07 MED ORDER — PROTAMINE SULFATE 10 MG/ML IV SOLN
INTRAVENOUS | Status: DC | PRN
  Administered 2013-05-07: 40 mg via INTRAVENOUS
  Administered 2013-05-07: 10 mg via INTRAVENOUS

## 2013-05-07 MED ORDER — PNEUMOCOCCAL VAC POLYVALENT 25 MCG/0.5ML IJ INJ
0.5000 mL | INJECTION | INTRAMUSCULAR | Status: AC
  Administered 2013-05-08: 0.5 mL via INTRAMUSCULAR
  Filled 2013-05-07: qty 0.5

## 2013-05-07 MED ORDER — METOPROLOL TARTRATE 1 MG/ML IV SOLN: 2.0000 mg | INTRAVENOUS | Status: DC | PRN

## 2013-05-07 MED ORDER — PROMETHAZINE HCL 25 MG/ML IJ SOLN
INTRAMUSCULAR | Status: AC
  Administered 2013-05-07: 6.25 mg via INTRAVENOUS
  Filled 2013-05-07: qty 1

## 2013-05-07 MED ORDER — ONDANSETRON HCL 4 MG/2ML IJ SOLN
INTRAMUSCULAR | Status: DC | PRN
  Administered 2013-05-07: 4 mg via INTRAVENOUS

## 2013-05-07 MED ORDER — PROTAMINE SULFATE 10 MG/ML IV SOLN
INTRAVENOUS | Status: AC
  Filled 2013-05-07: qty 5

## 2013-05-07 MED ORDER — HYDROCHLOROTHIAZIDE 12.5 MG PO CAPS
12.5000 mg | ORAL_CAPSULE | Freq: Every day | ORAL | Status: DC
  Administered 2013-05-08: 12.5 mg via ORAL
  Filled 2013-05-07: qty 1

## 2013-05-07 MED ORDER — FENTANYL CITRATE 0.05 MG/ML IJ SOLN
INTRAMUSCULAR | Status: AC
  Filled 2013-05-07: qty 5

## 2013-05-07 MED ORDER — PROMETHAZINE HCL 25 MG/ML IJ SOLN
6.2500 mg | INTRAMUSCULAR | Status: DC | PRN
  Administered 2013-05-07: 6.25 mg via INTRAVENOUS

## 2013-05-07 MED ORDER — FENTANYL CITRATE 0.05 MG/ML IJ SOLN
25.0000 ug | INTRAMUSCULAR | Status: DC | PRN
  Administered 2013-05-07 (×2): 50 ug via INTRAVENOUS

## 2013-05-07 MED ORDER — POTASSIUM CHLORIDE CRYS ER 20 MEQ PO TBCR: 20.0000 meq | EXTENDED_RELEASE_TABLET | Freq: Every day | ORAL | Status: DC | PRN

## 2013-05-07 MED ORDER — ROCURONIUM BROMIDE 100 MG/10ML IV SOLN
INTRAVENOUS | Status: DC | PRN
  Administered 2013-05-07: 50 mg via INTRAVENOUS

## 2013-05-07 MED ORDER — AMOXICILLIN 500 MG PO CAPS
500.0000 mg | ORAL_CAPSULE | Freq: Three times a day (TID) | ORAL | Status: DC
  Administered 2013-05-07 – 2013-05-08 (×3): 500 mg via ORAL
  Filled 2013-05-07 (×6): qty 1

## 2013-05-07 MED ORDER — NEOSTIGMINE METHYLSULFATE 1 MG/ML IJ SOLN
INTRAMUSCULAR | Status: DC | PRN
  Administered 2013-05-07: 4 mg via INTRAVENOUS

## 2013-05-07 MED ORDER — LABETALOL HCL 5 MG/ML IV SOLN: 10.0000 mg | INTRAVENOUS | Status: DC | PRN

## 2013-05-07 MED ORDER — GLYCOPYRROLATE 0.2 MG/ML IJ SOLN
INTRAMUSCULAR | Status: DC | PRN
  Administered 2013-05-07: 0.2 mg via INTRAVENOUS
  Administered 2013-05-07: .6 mg via INTRAVENOUS
  Administered 2013-05-07: 0.2 mg via INTRAVENOUS

## 2013-05-07 MED ORDER — OXYCODONE HCL 5 MG PO TABS: 5.0000 mg | ORAL_TABLET | Freq: Once | ORAL | Status: DC | PRN

## 2013-05-07 MED ORDER — ASPIRIN EC 81 MG PO TBEC
81.0000 mg | DELAYED_RELEASE_TABLET | Freq: Every day | ORAL | Status: DC
  Administered 2013-05-08: 81 mg via ORAL
  Filled 2013-05-07: qty 1

## 2013-05-07 MED ORDER — MIDAZOLAM HCL 5 MG/5ML IJ SOLN
INTRAMUSCULAR | Status: DC | PRN
  Administered 2013-05-07 (×2): 1 mg via INTRAVENOUS

## 2013-05-07 MED ORDER — LIDOCAINE HCL (CARDIAC) 20 MG/ML IV SOLN
INTRAVENOUS | Status: DC | PRN
  Administered 2013-05-07: 40 mg via INTRAVENOUS

## 2013-05-07 MED ORDER — LACTATED RINGERS IV SOLN
INTRAVENOUS | Status: DC
  Administered 2013-05-07: 08:00:00 via INTRAVENOUS

## 2013-05-07 MED ORDER — FLEET ENEMA 7-19 GM/118ML RE ENEM: 1.0000 | ENEMA | Freq: Once | RECTAL | Status: AC | PRN

## 2013-05-07 MED ORDER — MIDAZOLAM HCL 2 MG/2ML IJ SOLN
INTRAMUSCULAR | Status: AC
  Filled 2013-05-07: qty 2

## 2013-05-07 MED ORDER — ROCURONIUM BROMIDE 50 MG/5ML IV SOLN
INTRAVENOUS | Status: AC
  Filled 2013-05-07: qty 1

## 2013-05-07 MED ORDER — PROPOFOL 10 MG/ML IV BOLUS
INTRAVENOUS | Status: AC
  Filled 2013-05-07: qty 20

## 2013-05-07 MED ORDER — GLYCOPYRROLATE 0.2 MG/ML IJ SOLN
INTRAMUSCULAR | Status: AC
  Filled 2013-05-07: qty 1

## 2013-05-07 MED ORDER — AMOXICILLIN 875 MG PO TABS: 875.0000 mg | ORAL_TABLET | Freq: Two times a day (BID) | ORAL | Status: DC

## 2013-05-07 MED ORDER — HYDROMORPHONE HCL PF 1 MG/ML IJ SOLN
0.5000 mg | INTRAMUSCULAR | Status: DC | PRN
  Administered 2013-05-07 – 2013-05-08 (×3): 1 mg via INTRAVENOUS
  Filled 2013-05-07 (×3): qty 1

## 2013-05-07 MED ORDER — PHENYLEPHRINE HCL 10 MG/ML IJ SOLN
INTRAMUSCULAR | Status: AC
  Filled 2013-05-07: qty 1

## 2013-05-07 MED ORDER — HEPARIN SODIUM (PORCINE) 1000 UNIT/ML IJ SOLN
INTRAMUSCULAR | Status: AC
  Filled 2013-05-07: qty 1

## 2013-05-07 MED ORDER — FENTANYL CITRATE 0.05 MG/ML IJ SOLN
INTRAMUSCULAR | Status: DC | PRN
  Administered 2013-05-07 (×3): 50 ug via INTRAVENOUS
  Administered 2013-05-07: 200 ug via INTRAVENOUS

## 2013-05-07 MED ORDER — PROTAMINE SULFATE 10 MG/ML IV SOLN: INTRAVENOUS | Status: DC | PRN

## 2013-05-07 MED ORDER — PANTOPRAZOLE SODIUM 40 MG PO TBEC
40.0000 mg | DELAYED_RELEASE_TABLET | Freq: Every day | ORAL | Status: DC
  Administered 2013-05-07 – 2013-05-08 (×2): 40 mg via ORAL
  Filled 2013-05-07 (×2): qty 1

## 2013-05-07 MED ORDER — AMLODIPINE BESYLATE 5 MG PO TABS
5.0000 mg | ORAL_TABLET | Freq: Every day | ORAL | Status: DC
  Administered 2013-05-08: 5 mg via ORAL
  Filled 2013-05-07: qty 1

## 2013-05-07 MED ORDER — MEPERIDINE HCL 25 MG/ML IJ SOLN: 6.2500 mg | INTRAMUSCULAR | Status: DC | PRN

## 2013-05-07 MED ORDER — NEOSTIGMINE METHYLSULFATE 1 MG/ML IJ SOLN
INTRAMUSCULAR | Status: AC
Start: 2013-05-07 — End: 2013-05-07
  Filled 2013-05-07: qty 10

## 2013-05-07 MED ORDER — SODIUM CHLORIDE 0.9 % IV SOLN
INTRAVENOUS | Status: DC
  Administered 2013-05-07: 16:00:00 via INTRAVENOUS

## 2013-05-07 MED ORDER — FENTANYL CITRATE 0.05 MG/ML IJ SOLN
INTRAMUSCULAR | Status: AC
  Administered 2013-05-07: 50 ug via INTRAVENOUS
  Filled 2013-05-07: qty 2

## 2013-05-07 MED ORDER — EPHEDRINE SULFATE 50 MG/ML IJ SOLN
INTRAMUSCULAR | Status: AC
  Filled 2013-05-07: qty 1

## 2013-05-07 MED ORDER — ONDANSETRON HCL 4 MG/2ML IJ SOLN
INTRAMUSCULAR | Status: AC
  Filled 2013-05-07: qty 2

## 2013-05-07 MED ORDER — FENOFIBRATE 54 MG PO TABS
54.0000 mg | ORAL_TABLET | Freq: Every day | ORAL | Status: DC
  Administered 2013-05-08: 54 mg via ORAL
  Filled 2013-05-07: qty 1

## 2013-05-07 MED ORDER — 0.9 % SODIUM CHLORIDE (POUR BTL) OPTIME
TOPICAL | Status: DC | PRN
  Administered 2013-05-07: 3000 mL

## 2013-05-07 MED ORDER — SODIUM CHLORIDE 0.9 % IV SOLN: 500.0000 mL | Freq: Once | INTRAVENOUS | Status: AC | PRN

## 2013-05-07 MED ORDER — HEPARIN SODIUM (PORCINE) 1000 UNIT/ML IJ SOLN
INTRAMUSCULAR | Status: DC | PRN
  Administered 2013-05-07: 7000 [IU] via INTRAVENOUS
  Administered 2013-05-07: 1000 [IU] via INTRAVENOUS

## 2013-05-07 MED ORDER — ALUM & MAG HYDROXIDE-SIMETH 200-200-20 MG/5ML PO SUSP: 15.0000 mL | ORAL | Status: DC | PRN

## 2013-05-07 MED ORDER — GLYCOPYRROLATE 0.2 MG/ML IJ SOLN
INTRAMUSCULAR | Status: AC
  Filled 2013-05-07: qty 3

## 2013-05-07 MED ORDER — SENNOSIDES-DOCUSATE SODIUM 8.6-50 MG PO TABS
1.0000 | ORAL_TABLET | Freq: Every evening | ORAL | Status: DC | PRN
  Filled 2013-05-07: qty 1

## 2013-05-07 MED ORDER — HYDROCODONE-ACETAMINOPHEN 5-325 MG PO TABS
1.0000 | ORAL_TABLET | Freq: Four times a day (QID) | ORAL | Status: DC | PRN
  Administered 2013-05-08: 1 via ORAL
  Filled 2013-05-07: qty 2
  Filled 2013-05-07: qty 1

## 2013-05-07 MED ORDER — PHENOL 1.4 % MT LIQD: 1.0000 | OROMUCOSAL | Status: DC | PRN

## 2013-05-07 MED ORDER — ENOXAPARIN SODIUM 30 MG/0.3ML ~~LOC~~ SOLN
30.0000 mg | SUBCUTANEOUS | Status: DC
  Administered 2013-05-08: 30 mg via SUBCUTANEOUS
  Filled 2013-05-07 (×2): qty 0.3

## 2013-05-07 MED ORDER — METOCLOPRAMIDE HCL 10 MG PO TABS
10.0000 mg | ORAL_TABLET | Freq: Two times a day (BID) | ORAL | Status: DC
  Administered 2013-05-07: 10 mg via ORAL
  Filled 2013-05-07 (×3): qty 1

## 2013-05-07 MED ORDER — GUAIFENESIN-DM 100-10 MG/5ML PO SYRP: 15.0000 mL | ORAL_SOLUTION | ORAL | Status: DC | PRN

## 2013-05-07 MED ORDER — CEFUROXIME SODIUM 1.5 G IJ SOLR
1.5000 g | Freq: Two times a day (BID) | INTRAMUSCULAR | Status: AC
  Administered 2013-05-07 – 2013-05-08 (×2): 1.5 g via INTRAVENOUS
  Filled 2013-05-07 (×3): qty 1.5

## 2013-05-07 MED ORDER — LIDOCAINE HCL (PF) 1 % IJ SOLN
INTRAMUSCULAR | Status: AC
  Filled 2013-05-07: qty 30

## 2013-05-07 MED ORDER — TRAMADOL HCL 50 MG PO TABS: 50.0000 mg | ORAL_TABLET | Freq: Four times a day (QID) | ORAL | Status: DC | PRN

## 2013-05-07 MED ORDER — DOPAMINE-DEXTROSE 3.2-5 MG/ML-% IV SOLN: 3.0000 ug/kg/min | INTRAVENOUS | Status: DC

## 2013-05-07 MED ORDER — MAGNESIUM SULFATE 40 MG/ML IJ SOLN
2.0000 g | Freq: Every day | INTRAMUSCULAR | Status: DC | PRN
  Filled 2013-05-07: qty 50

## 2013-05-07 MED ORDER — ONDANSETRON HCL 4 MG/2ML IJ SOLN
4.0000 mg | Freq: Four times a day (QID) | INTRAMUSCULAR | Status: DC | PRN
  Administered 2013-05-07 – 2013-05-08 (×2): 4 mg via INTRAVENOUS
  Filled 2013-05-07 (×2): qty 2

## 2013-05-07 SURGICAL SUPPLY — 57 items
ADH SKN CLS APL DERMABOND .7 (GAUZE/BANDAGES/DRESSINGS) ×1
CANISTER SUCTION 2500CC (MISCELLANEOUS) ×2 IMPLANT
CATH ROBINSON RED A/P 18FR (CATHETERS) ×2 IMPLANT
CATH SUCT 10FR WHISTLE TIP (CATHETERS) ×2 IMPLANT
CLIP TI MEDIUM 6 (CLIP) ×2 IMPLANT
CLIP TI WIDE RED SMALL 24 (CLIP) ×1 IMPLANT
CLIP TI WIDE RED SMALL 6 (CLIP) ×2 IMPLANT
CONT SPEC 4OZ CLIKSEAL STRL BL (MISCELLANEOUS) ×1 IMPLANT
COVER SURGICAL LIGHT HANDLE (MISCELLANEOUS) ×2 IMPLANT
CRADLE DONUT ADULT HEAD (MISCELLANEOUS) ×2 IMPLANT
DERMABOND ADVANCED (GAUZE/BANDAGES/DRESSINGS) ×1
DERMABOND ADVANCED .7 DNX12 (GAUZE/BANDAGES/DRESSINGS) ×1 IMPLANT
DRAIN CHANNEL 15F RND FF W/TCR (WOUND CARE) IMPLANT
DRAPE PROXIMA HALF (DRAPES) ×1 IMPLANT
DRAPE WARM FLUID 44X44 (DRAPE) ×2 IMPLANT
ELECT REM PT RETURN 9FT ADLT (ELECTROSURGICAL) ×2
ELECTRODE REM PT RTRN 9FT ADLT (ELECTROSURGICAL) ×1 IMPLANT
EVACUATOR SILICONE 100CC (DRAIN) IMPLANT
GLOVE BIOGEL PI IND STRL 6.5 (GLOVE) IMPLANT
GLOVE BIOGEL PI IND STRL 7.0 (GLOVE) IMPLANT
GLOVE BIOGEL PI IND STRL 7.5 (GLOVE) ×1 IMPLANT
GLOVE BIOGEL PI INDICATOR 6.5 (GLOVE) ×1
GLOVE BIOGEL PI INDICATOR 7.0 (GLOVE) ×1
GLOVE BIOGEL PI INDICATOR 7.5 (GLOVE) ×2
GLOVE SS BIOGEL STRL SZ 7 (GLOVE) IMPLANT
GLOVE SUPERSENSE BIOGEL SZ 7 (GLOVE) ×1
GLOVE SURG SS PI 7.5 STRL IVOR (GLOVE) ×2 IMPLANT
GOWN STRL REUS W/ TWL LRG LVL3 (GOWN DISPOSABLE) ×2 IMPLANT
GOWN STRL REUS W/ TWL XL LVL3 (GOWN DISPOSABLE) ×1 IMPLANT
GOWN STRL REUS W/TWL LRG LVL3 (GOWN DISPOSABLE) ×2
GOWN STRL REUS W/TWL XL LVL3 (GOWN DISPOSABLE) ×4
HEMOSTAT SNOW SURGICEL 2X4 (HEMOSTASIS) ×1 IMPLANT
INSERT FOGARTY SM (MISCELLANEOUS) IMPLANT
KIT BASIN OR (CUSTOM PROCEDURE TRAY) ×2 IMPLANT
KIT ROOM TURNOVER OR (KITS) ×2 IMPLANT
NS IRRIG 1000ML POUR BTL (IV SOLUTION) ×6 IMPLANT
PACK CAROTID (CUSTOM PROCEDURE TRAY) ×2 IMPLANT
PAD ARMBOARD 7.5X6 YLW CONV (MISCELLANEOUS) ×4 IMPLANT
PATCH VASCULAR VASCU GUARD 1X6 (Vascular Products) ×1 IMPLANT
SHUNT CAROTID BYPASS 10 (VASCULAR PRODUCTS) IMPLANT
SHUNT CAROTID BYPASS 12FRX15.5 (VASCULAR PRODUCTS) IMPLANT
SPONGE INTESTINAL PEANUT (DISPOSABLE) ×2 IMPLANT
SUT ETHILON 3 0 PS 1 (SUTURE) IMPLANT
SUT PROLENE 6 0 BV (SUTURE) ×5 IMPLANT
SUT PROLENE 7 0 BV 1 (SUTURE) IMPLANT
SUT PROLENE 7 0 BV1 MDA (SUTURE) ×2 IMPLANT
SUT SILK 3 0 (SUTURE) ×2
SUT SILK 3 0 TIES 17X18 (SUTURE)
SUT SILK 3-0 18XBRD TIE 12 (SUTURE) IMPLANT
SUT SILK 3-0 18XBRD TIE BLK (SUTURE) IMPLANT
SUT VIC AB 3-0 SH 27 (SUTURE) ×4
SUT VIC AB 3-0 SH 27X BRD (SUTURE) ×2 IMPLANT
SUT VICRYL 4-0 PS2 18IN ABS (SUTURE) ×2 IMPLANT
TAPE STRIPS DRAPE STRL (GAUZE/BANDAGES/DRESSINGS) ×1 IMPLANT
TOWEL OR 17X24 6PK STRL BLUE (TOWEL DISPOSABLE) ×2 IMPLANT
TOWEL OR 17X26 10 PK STRL BLUE (TOWEL DISPOSABLE) ×2 IMPLANT
WATER STERILE IRR 1000ML POUR (IV SOLUTION) ×2 IMPLANT

## 2013-05-07 NOTE — Progress Notes (Signed)
PHARMACY - RENAL ADJUSTMENT OF ANTIBIOTICS  73 y/o female to receive Zinacef post-op L CEA.  SCr 0.71, CrCl 64 ml/min  No adjustment needed for current renal function. Continue with Zinacef 1.5 g IV q12h x 2 doses. Pharmacy signing off.   [place], Pharm.D., BCPS Clinical Pharmacist Pager: [phone] 05/07/2013 3:26 PM

## 2013-05-07 NOTE — Progress Notes (Signed)
Pt admitted to 3300 from pacu. Oriented to unit and room. Safety discussed and call bell with in reach. VSS. Left neck is swollen and left cheek very puffy. Patient states that is normal for her. Applied ice, will continue to monitor.  Erick Blinks, RN

## 2013-05-07 NOTE — H&P (View-Only) (Signed)
Patient name: Caitlin Oliver MRN: [id number] DOB: 03/12/1941 Sex: female     Chief Complaint  Patient presents with  . Re-evaluation    4 wk f/u with ENT visit prior    HISTORY OF PRESENT ILLNESS: This is a very pleasant 73 year old female referred to me by Dr. Gwenlyn Found for evaluation of carotid stenosis, bilateral. The patient was identified with a carotid bruit. Ultrasound revealed greater than 70% bilateral carotid stenosis. This was further confirmed with angiography. Both sides were greater than 85%.  She underwent right carotid endarterectomy on 03/06/2013.  Her postoperative course was uncomplicated.  She was recently evaluated by ear nose and throat and found to have normal vocal cord motion.  She has no symptoms related to her left carotid stenosis.  The patient continues to be medically managed for diabetes hypercholesterolemia and hypertension.  She is on a statin as well as an ACE inhibitor.   Past Medical History  Diagnosis Date  . Hypertension   . Hypercholesteremia   . Diabetes mellitus   . Anxiety   . Stomach ulcer   . Claudication   . Tobacco abuse   . Carotid artery disease   . Peripheral arterial disease     bilateral iliac artery stenosis by angiography  . COPD (chronic obstructive pulmonary disease)   . Shortness of breath     with exertion  . GERD (gastroesophageal reflux disease)     Past Surgical History  Procedure Laterality Date  . Tubal ligation    . Abdominal hysterectomy    . Breast reduction surgery    . Appendectomy    . Salivary gland surgery      scar tissue removed from left saliva glad  . Endarterectomy Right 03/06/2013    Procedure: ENDARTERECTOMY CAROTID-RIGHT;  Surgeon: Serafina Mitchell, MD;  Location: [facility];  Service: Vascular;  Laterality: Right;  . Patch angioplasty Right 03/06/2013    Procedure: PATCH ANGIOPLASTY of Right Carotid Artery using Vascu-Guard Patch;  Surgeon: Serafina Mitchell, MD;  Location: [facility];  Service: Vascular;   Laterality: Right;    History   Social History  . Marital Status: Widowed    Spouse Name: N/A    Number of Children: N/A  . Years of Education: N/A   Occupational History  . Not on file.   Social History Main Topics  . Smoking status: Former Smoker -- 1.50 packs/day for 56 years    Types: Cigarettes  . Smokeless tobacco: Former Systems developer    Quit date: 02/25/2013  . Alcohol Use: No  . Drug Use: No  . Sexual Activity: Not on file   Other Topics Concern  . Not on file   Social History Narrative  . No narrative on file    Family History  Problem Relation Age of Onset  . Stroke Mother   . Hypertension Mother   . Kidney failure Father     Allergies as of 04/13/2013 - Review Complete 04/13/2013  Allergen Reaction Noted  . Estrace [estradiol] Swelling 11/19/2011    Current Outpatient Prescriptions on File Prior to Visit  Medication Sig Dispense Refill  . amLODipine (NORVASC) 5 MG tablet Take 5 mg by mouth daily.      [name] aspirin EC 81 MG tablet Take 81 mg by mouth daily.      . diazepam (VALIUM) 5 MG tablet Take 5 mg by mouth every 12 (twelve) hours as needed. Anxiety      . fenofibrate micronized (LOFIBRA) 134  MG capsule Take 134 mg by mouth daily before breakfast.      . fish oil-omega-3 fatty acids 1000 MG capsule Take 1 g by mouth daily.      . hydrochlorothiazide (MICROZIDE) 12.5 MG capsule Take 12.5 mg by mouth daily.      [name] lisinopril (PRINIVIL,ZESTRIL) 20 MG tablet Take 20 mg by mouth daily.      . metFORMIN (GLUCOPHAGE) 500 MG tablet Take 500 mg by mouth daily.       . metoCLOPramide (REGLAN) 10 MG tablet Take 10 mg by mouth 2 (two) times daily.       [name] omeprazole (PRILOSEC) 20 MG capsule Take 20 mg by mouth 2 (two) times daily before a meal.       . oxyCODONE-acetaminophen (ROXICET) 5-325 MG per tablet Take 1-2 tablets by mouth every 4 (four) hours as needed for severe pain.  30 tablet  0  . pravastatin (PRAVACHOL) 40 MG tablet Take 40 mg by mouth daily.      .  traMADol (ULTRAM) 50 MG tablet Take 1 tablet (50 mg total) by mouth every 6 (six) hours as needed.  20 tablet  0   No current facility-administered medications on file prior to visit.     REVIEW OF SYSTEMS: No changes from prior visit  PHYSICAL EXAMINATION:   Vital signs are BP 101/63  Pulse 82  Ht _0  (1.626 m)  Wt 165 lb 4.8 oz (74.98 kg)  BMI 28.36 kg/m2  SpO2 97% General: The patient appears their stated age. HEENT:  No gross abnormalities Pulmonary:  Non labored breathing Musculoskeletal: There are no major deformities. Neurologic: No focal weakness or paresthesias are detected, Skin: There are no ulcer or rashes noted. Psychiatric: The patient has normal affect. Cardiovascular: There is a regular rate and rhythm without significant murmur appreciated.   Diagnostic Studies None  Assessment: Asymptomatic left carotid stenosis Plan: The patient will be scheduled for left carotid endarterectomy on Thursday, January 22.  The patient is well aware of the risks and benefits including the risk of nerve injury, bleeding, and stroke.  Eldridge Abrahams, M.D. [facility] [place] Office: [phone] Pager:  [phone]

## 2013-05-07 NOTE — Progress Notes (Signed)
[facility] [place]  I was called to check on Caitlin Oliver due to facial asymmetry.  This was present prior to surgery right facial droop since her right carotid surgery last year.     PE: gripp equal 5/5 bil. UE Palpable pulses radial equal No tongue deviation Alert and oriented to place person and time.  Caitlin Oliver MAUREEN PA-C

## 2013-05-07 NOTE — Interval H&P Note (Signed)
History and Physical Interval Note:  05/07/2013 9:02 AM  Caitlin Oliver  has presented today for surgery, with the diagnosis of LEFT ICA STENOSIS  The various methods of treatment have been discussed with the patient and family. After consideration of risks, benefits and other options for treatment, the patient has consented to  Procedure(s): ENDARTERECTOMY CAROTID (Left) as a surgical intervention .  The patient's history has been reviewed, patient examined, no change in status, stable for surgery.  I have reviewed the patient's chart and labs.  Questions were answered to the patient's satisfaction.     Caitlin Oliver IV, V. WELLS

## 2013-05-07 NOTE — Transfer of Care (Signed)
Immediate Anesthesia Transfer of Care Note  Patient: Caitlin Oliver  Procedure(s) Performed: Procedure(s): LEFT CAROTID ARTERY ENDARTERECTOMY WITH VASCU-GUARD PATCH ANGIOPLASTY  (Left)  Patient Location: PACU  Anesthesia Type:General  Level of Consciousness: awake, alert  and oriented  Airway & Oxygen Therapy: Patient Spontanous Breathing  Post-op Assessment: Report given to PACU RN  Post vital signs: Reviewed and stable  Complications: No apparent anesthesia complications

## 2013-05-07 NOTE — Op Note (Signed)
Patient name: Caitlin Oliver MRN: [id number] DOB: 07/29/1940 Sex: female  05/07/2013 Pre-operative Diagnosis: Asx   left carotid stenosis Post-operative diagnosis:  Same Surgeon:  Eldridge Abrahams Assistants:  Lennie Muckle Procedure:    left carotid Endarterectomy with bovine pericardial patch angioplasty   Resection of redundant left common carotid artery with primary anastamosis Anesthesia:  General Blood Loss:  See anesthesia record Specimens:  Carotid Plaque to pathology  Findings:  95 %stenosis; Thrombus:  none  Indications:  73 yo with bilateral carotid stenosis which is asx.  She has previously undergone R CEA.  Procedure:  The patient was identified in the holding area and taken to [place] [facility]  The patient was then placed supine on the table.   General endotrachial anesthesia was administered.  The patient was prepped and draped in the usual sterile fashion.  A time out was called and antibiotics were administered.  The incision was made along the anterior border of the left sternocleidomastoid muscle.  Cautery was used to dissect through the subcutaneous tissue.  The platysma muscle was divided with cautery.  The internal jugular vein was exposed along its anterior medial border.  The common facial vein was exposed and then divided between 2-0 silk ties and metal clips.  The common carotid artery was then circumferentially exposed and encircled with an umbilical tape.  The vagus nerve was identified and protected.  Next sharp dissection was used to expose the external carotid artery and the superior thyroid artery.  The were encircled with a blue vessel loop and a 2-0 silk tie respectively.  Finally, the internal carotid was carefully dissected free.  An umbilical tape was placed around the internal carotid artery distal to the diseased segment.  The hypoglossal nerve was visualized throughout and protected.  The patient was given systemic heparinization.  A bovine carotid patch was  selected and prepared on the back table.  A 10 french shunt was also prepared.  After blood pressure readings were appropriate and the heparin had been given time to circulate, the internal carotid artery was occluded with a baby Gregory clamp.  The external and common carotid arteries were then occluded with vascular clamps and the 2-0 tie tightened on the superior thyroid artery.  A #11 blade was used to make an arteriotomy in the common carotid artery.  This was extended with Potts scissors along the anterior and lateral border of the common and internal carotid artery.  Approximately 95% stenosis was identified.  There was no thrombus identified.  The 10 french shunt was not  placed secondary to adequate backbleeding.  A kleiner kuntz elevator was used to perform endarterectomy.  An eversion endarterectomy was performed in the external carotid artery.  A good distal endpoint was obtained in the internal carotid artery.  The specimen was removed and sent to pathology.  Heparinized saline was used to irrigate the endarterectomized field.  All potential embolic debris was removed.  I resected about 2cm of the common carotid artery to avoid kinking.  Primary anastamosis was perforemd with 6-0 prolene.  Bovine pericardial patch angioplasty was then performed using a running 6-0 Prolene.  The common internal and external carotid arteries were all appropriately flushed. The artery was again irrigated with heparin saline.  The anastomosis was then secured. The clamp was first released on the external carotid artery followed by the common carotid artery approximately 30 seconds later, bloodflow was reestablish through the internal carotid artery.  Next, a hand-held  Doppler was used to evaluate the signals in the common, external, and internal  carotid arteries, all of which had appropriate signals. I then administered  50 mg protamine. The wound was then irrigated.  After hemostasis was achieved, the carotid sheath  was reapproximated with 3-0 Vicryl. The  platysma muscle was reapproximated with running 3-0 Vicryl. The skin  was closed with 4-0 Vicryl. Dermabond was placed on the skin. The  patient was then successfully extubated. Her neurologic exam was  similar to his preprocedural exam. The patient was then taken to recovery room  in stable condition. There were no complications.     Disposition:  To PACU in stable condition.  Relevant Operative Details:  The carotid system was redundant and therefore I resected about 2cm of the common carotid artery and did a primary anastamosis.  Patch angioplasty was also performed of the CCA and ICA.  Normal location of the hypoglossal.  Theotis Burrow, M.D. [facility] [place] Office: [phone] Pager:  [phone]

## 2013-05-07 NOTE — Anesthesia Postprocedure Evaluation (Signed)
  Anesthesia Post-op Note  Patient: Caitlin Oliver  Procedure(s) Performed: Procedure(s): LEFT CAROTID ARTERY ENDARTERECTOMY WITH VASCU-GUARD PATCH ANGIOPLASTY  (Left)  Patient Location: PACU  Anesthesia Type:General  Level of Consciousness: awake, alert , oriented and patient cooperative  Airway and Oxygen Therapy: Patient Spontanous Breathing and Patient connected to nasal cannula oxygen  Post-op Pain: mild  Post-op Assessment: Post-op Vital signs reviewed, Patient's Cardiovascular Status Stable, Respiratory Function Stable, Patent Airway, No signs of Nausea or vomiting and Pain level controlled  Post-op Vital Signs: Reviewed and stable  Complications: No apparent anesthesia complications

## 2013-05-07 NOTE — Anesthesia Preprocedure Evaluation (Addendum)
Anesthesia Evaluation  Patient identified by MRN, date of birth, ID band Patient awake    Reviewed: Allergy & Precautions, H&P , NPO status , Patient's Chart, lab work & pertinent test results  History of Anesthesia Complications Negative for: history of anesthetic complications  Airway Mallampati: II TM Distance: >3 FB Neck ROM: Full    Dental  (+) Dental Advisory Given and Teeth Intact   Pulmonary shortness of breath and with exertion, COPDformer smoker (quit 11/14),  breath sounds clear to auscultation  + decreased breath sounds      Cardiovascular hypertension, Pt. on medications + Peripheral Vascular Disease Rhythm:Regular Rate:Normal  10/14 Stress test: normal study, no ischemia   Neuro/Psych Anxiety negative neurological ROS     GI/Hepatic Neg liver ROS, PUD, GERD-  Medicated and Controlled,  Endo/Other  diabetes (glu 128), Well Controlled, Type 2, Oral Hypoglycemic Agents  Renal/GU negative Renal ROS     Musculoskeletal   Abdominal   Peds  Hematology   Anesthesia Other Findings   Reproductive/Obstetrics                          Anesthesia Physical Anesthesia Plan  ASA: III  Anesthesia Plan: General   Post-op Pain Management:    Induction: Intravenous  Airway Management Planned: Oral ETT  Additional Equipment: Arterial line  Intra-op Plan:   Post-operative Plan: Extubation in OR  Informed Consent: I have reviewed the patients History and Physical, chart, labs and discussed the procedure including the risks, benefits and alternatives for the proposed anesthesia with the patient or authorized representative who has indicated his/her understanding and acceptance.   Dental advisory given  Plan Discussed with: CRNA, Anesthesiologist and Surgeon  Anesthesia Plan Comments: (Plan routine monitors, A line, GETA)       Anesthesia Quick Evaluation

## 2013-05-07 NOTE — Progress Notes (Signed)
Pt c/o nausea just prior to leaving PACU. Phenergan given IV. Will notify 3S RN.

## 2013-05-07 NOTE — Telephone Encounter (Addendum)
Message copied by Gena Fray on Thu May 07, 2013  1:17 PM ------      Message from: Peter Minium K      Created: Thu May 07, 2013 12:57 PM      Regarding: schedule                   ----- Message -----         From: Ulyses Amor, PA-C         Sent: 05/07/2013  12:36 PM           To: [facility] Charge Pool            F/U with dr. Trula Slade in 2 weeks s/p carotid left side surgery ------  05/07/13: no answer, no voicemail. I have mailed an appointment letter, dpm

## 2013-05-07 NOTE — Preoperative (Signed)
Beta Blockers   Reason not to administer Beta Blockers:Not Applicable 

## 2013-05-08 LAB — BASIC METABOLIC PANEL
BUN: 10 mg/dL (ref 6–23)
CO2: 24 meq/L (ref 19–32)
CREATININE: 0.45 mg/dL — AB (ref 0.50–1.10)
Calcium: 8.6 mg/dL (ref 8.4–10.5)
Chloride: 102 mEq/L (ref 96–112)
GFR calc non Af Amer: >90 mL/min (ref >90)
Glucose, Bld: 133 mg/dL — ABNORMAL HIGH (ref 70–99)
Potassium: 4.1 mEq/L (ref 3.7–5.3)
Sodium: 138 mEq/L (ref 137–147)

## 2013-05-08 LAB — CBC
HCT: 41.5 % (ref 36.0–46.0)
Hemoglobin: 13.8 g/dL (ref 12.0–15.0)
MCH: 31 pg (ref 26.0–34.0)
MCHC: 33.3 g/dL (ref 30.0–36.0)
MCV: 93.3 fL (ref 78.0–100.0)
PLATELETS: 287 10*3/uL (ref 150–400)
RBC: 4.45 MIL/uL (ref 3.87–5.11)
RDW: 13.4 % (ref 11.5–15.5)
WBC: 10 10*3/uL (ref 4.0–10.5)

## 2013-05-08 LAB — GLUCOSE, CAPILLARY
Glucose-Capillary: 101 mg/dL — ABNORMAL HIGH (ref 70–99)
Glucose-Capillary: 121 mg/dL — ABNORMAL HIGH (ref 70–99)
Glucose-Capillary: 139 mg/dL — ABNORMAL HIGH (ref 70–99)

## 2013-05-08 MED ORDER — HYDROCODONE-ACETAMINOPHEN 5-325 MG PO TABS: 1.0000 | ORAL_TABLET | Freq: Four times a day (QID) | ORAL | Status: DC | PRN

## 2013-05-08 NOTE — Progress Notes (Deleted)
Patient is being discharged home today. IV has been D/C. Reviewed all discharge instructions with the patient and her nephew. All questions and concerns have been answered. Patient requested a pain pill so she was given one Norco pill for neck discomfort.

## 2013-05-08 NOTE — Progress Notes (Signed)
Utilization review completed. Nemesis Rainwater, RN, BSN. 

## 2013-05-08 NOTE — Progress Notes (Signed)
[facility] [place] [place]  Subjective  - Doing well at pre-op base line.  Ambulating in room, taking PO's well and has voided.   Objective 129/67 100 98.3 F (36.8 C) (Oral) 17 90%  Intake/Output Summary (Last 24 hours) at 05/08/13 0743 Last data filed at 05/08/13 0630  Gross per 24 hour  Intake 4387.5 ml  Output    750 ml  Net 3637.5 ml     Left carotid incision C/D/I gripp equal 5/5 bil. UE  Palpable pulses radial equal  No tongue deviation  Alert and oriented to place person and time. facial asymmetry present prior to this surgery without changes   Assessment/Planning: POD #1 Left carotid Endarterectomy with bovine pericardial patch angioplasty  Resection of redundant left common carotid artery with primary anastamosis  D/C home  Laurence Slate [facility] 05/08/2013 7:43 AM --  Laboratory Lab Results:  Recent Labs  05/07/13 1530 05/08/13 0600  WBC 13.4* 10.0  HGB 13.8 13.8  HCT 40.7 41.5  PLT 275 287   BMET  Recent Labs  05/07/13 1530 05/08/13 0600  NA  --  138  K  --  4.1  CL  --  102  CO2  --  24  GLUCOSE  --  133*  BUN  --  10  CREATININE 0.51 0.45*  CALCIUM  --  8.6    COAG Lab Results  Component Value Date   INR 0.96 04/30/2013   INR 0.92 03/03/2013   INR 0.93 02/18/2013   No results found for this basename: PTT

## 2013-05-11 ENCOUNTER — Encounter (HOSPITAL_COMMUNITY): Payer: Self-pay | Admitting: Surgery

## 2013-05-12 NOTE — Discharge Summary (Signed)
I agree with the above  Wells Naz Denunzio 

## 2013-05-12 NOTE — Discharge Summary (Signed)
[facility] Discharge Summary   Patient ID:  Caitlin Oliver MRN: [id number] DOB/AGE: May 15, 1940 73 y.o.  Admit date: 05/07/2013 Discharge date: 05/12/2013 Date of Surgery: 05/07/2013 Surgeon: Surgeon(s): Serafina Mitchell, MD  Admission Diagnosis: LEFT ICA STENOSIS  Discharge Diagnoses:  LEFT ICA STENOSIS  Secondary Diagnoses: Past Medical History  Diagnosis Date  . Hypertension   . Hypercholesteremia   . Diabetes mellitus   . Anxiety   . Stomach ulcer   . Claudication   . Tobacco abuse   . Carotid artery disease   . Peripheral arterial disease     bilateral iliac artery stenosis by angiography  . COPD (chronic obstructive pulmonary disease)   . Shortness of breath     with exertion  . GERD (gastroesophageal reflux disease)     Procedure(s): LEFT CAROTID ARTERY ENDARTERECTOMY WITH VASCU-GUARD PATCH ANGIOPLASTY   Discharged Condition: good  HPI: This is a very pleasant 73 year old female referred to me by Dr. Gwenlyn Found for evaluation of carotid stenosis, bilateral. The patient was identified with a carotid bruit. Ultrasound revealed greater than 70% bilateral carotid stenosis. This was further confirmed with angiography. Both sides were greater than 85%. She underwent right carotid endarterectomy on 03/06/2013. Her postoperative course was uncomplicated. She was recently evaluated by ear nose and throat and found to have normal vocal cord motion. She has no symptoms related to her left carotid stenosis.  The patient continues to be medically managed for diabetes hypercholesterolemia and hypertension. She is on a statin as well as an ACE inhibitor.    She underwent surgery by Dr. Trula Slade on 05/07/2013 to include left carotid Endarterectomy with bovine pericardial patch angioplasty and resection of redundant left common carotid artery with primary anastomosis.  She had an uneventful stay.  She was noted to have facial asymmetry prior to the surgery without new  changes.      Hospital Course:  Caitlin Oliver is a 73 y.o. female is S/P Left Procedure(s): LEFT CAROTID ARTERY ENDARTERECTOMY WITH VASCU-GUARD PATCH ANGIOPLASTY  Extubated: POD # 0 Physical exam: Left carotid incision C/D/I  grip equal 5/5 bil. UE  Palpable pulses radial equal  No tongue deviation  Alert and oriented to place person and time.  facial asymmetry present prior to this surgery without changes  Post-op wounds healing well Pt. Ambulating, voiding and taking PO diet without difficulty. Pt pain controlled with PO pain meds. Labs as below Complications:none  Consults:     Significant Diagnostic Studies: CBC Lab Results  Component Value Date   WBC 10.0 05/08/2013   HGB 13.8 05/08/2013   HCT 41.5 05/08/2013   MCV 93.3 05/08/2013   PLT 287 05/08/2013    BMET    Component Value Date/Time   NA 138 05/08/2013 0600   K 4.1 05/08/2013 0600   CL 102 05/08/2013 0600   CO2 24 05/08/2013 0600   GLUCOSE 133* 05/08/2013 0600   BUN 10 05/08/2013 0600   CREATININE 0.45* 05/08/2013 0600   CREATININE 0.70 02/18/2013 1348   CALCIUM 8.6 05/08/2013 0600   GFRNONAA >90 05/08/2013 0600   GFRAA >90 05/08/2013 0600   COAG Lab Results  Component Value Date   INR 0.96 04/30/2013   INR 0.92 03/03/2013   INR 0.93 02/18/2013     Disposition:  Discharge to :Home Discharge Orders   Future Appointments Provider Department Dept Phone   05/25/2013 9:15 AM Serafina Mitchell, MD [facility] [phone]   06/02/2013 3:00 PM Pearletha Forge  Gwenlyn Found, MD [place] [phone]   Future Orders Complete By Expires   Call MD for:  redness, tenderness, or signs of infection (pain, swelling, bleeding, redness, odor or green/yellow discharge around incision site)  As directed    Call MD for:  severe or increased pain, loss or decreased feeling  in affected limb(s)  As directed    Call MD for:  temperature >100.5  As directed    Driving Restrictions  As directed     Comments:     No driving for 2 weeks   Increase activity slowly  As directed    Comments:     Walk with assistance use walker or cane as needed   Lifting restrictions  As directed    Comments:     No lifting for 6 weeks   May shower   As directed    Resume previous diet  As directed        Medication List    STOP taking these medications       oxyCODONE-acetaminophen 5-325 MG per tablet  Commonly known as:  PERCOCET/ROXICET      TAKE these medications       amLODipine 5 MG tablet  Commonly known as:  NORVASC  Take 5 mg by mouth daily.     amoxicillin 875 MG tablet  Commonly known as:  AMOXIL  Take 875 mg by mouth 2 (two) times daily.     aspirin EC 81 MG tablet  Take 81 mg by mouth daily.     diazepam 5 MG tablet  Commonly known as:  VALIUM  Take 5 mg by mouth every 12 (twelve) hours as needed. Anxiety     fenofibrate micronized 134 MG capsule  Commonly known as:  LOFIBRA  Take 134 mg by mouth daily before breakfast.     fish oil-omega-3 fatty acids 1000 MG capsule  Take 1 g by mouth daily.     hydrochlorothiazide 12.5 MG capsule  Commonly known as:  MICROZIDE  Take 12.5 mg by mouth daily.     HYDROcodone-acetaminophen 5-325 MG per tablet  Commonly known as:  NORCO/VICODIN  Take 1-2 tablets by mouth every 6 (six) hours as needed for moderate pain.     lisinopril 20 MG tablet  Commonly known as:  PRINIVIL,ZESTRIL  Take 20 mg by mouth daily.     metFORMIN 500 MG tablet  Commonly known as:  GLUCOPHAGE  Take 500 mg by mouth daily.     metoCLOPramide 10 MG tablet  Commonly known as:  REGLAN  Take 10 mg by mouth 2 (two) times daily.     omeprazole 20 MG capsule  Commonly known as:  PRILOSEC  Take 20 mg by mouth 2 (two) times daily before a meal.     pravastatin 40 MG tablet  Commonly known as:  PRAVACHOL  Take 40 mg by mouth daily.     traMADol 50 MG tablet  Commonly known as:  ULTRAM  Take 1 tablet (50 mg total) by mouth every 6 (six) hours as  needed.       Verbal and written Discharge instructions given to the patient. Wound care per Discharge AVS   Signed: Laurence Slate [facility] 05/12/2013, 8:51 AM  --- For VQI Registry use --- Instructions: Press F2 to tab through selections.  Delete question if not applicable.   Modified Rankin score at D/C (0-6): Rankin Score=1  IV medication needed for:  1. Hypertension: No 2. Hypotension: No  Post-op Complications: No  1. Post-op CVA or TIA: No  If yes: Event classification (right eye, left eye, right cortical, left cortical, verterobasilar, other):   If yes: Timing of event (intra-op, <6 hrs post-op, >=6 hrs post-op, unknown):   2. CN injury: No  If yes: CN  injuried   3. Myocardial infarction: No  If yes: Dx by (EKG or clinical, Troponin):   4.  CHF: No  5.  Dysrhythmia (new): No  6. Wound infection: No  7. Reperfusion symptoms: No  8. Return to OR: No  If yes: return to OR for (bleeding, neurologic, other CEA incision, other):   Discharge medications: Statin use:  No  for medical reason   ASA use:  Yes Beta blocker use:  no ACE-Inhibitor use:  Yes P2Y12 Antagonist use: [x ] None, _0  Plavix, _1  Plasugrel, _2  Ticlopinine, _3  Ticagrelor, _4  Other, _5  No for medical reason, _6  Non-compliant, _7  Not-indicated Anti-coagulant use:  [x ] None, _8  Warfarin, _9  Rivaroxaban, _10  Dabigatran, _11  Other, _12  No for medical reason, _13  Non-compliant, _14  Not-indicated

## 2013-05-20 ENCOUNTER — Telehealth: Payer: Self-pay

## 2013-05-20 NOTE — Telephone Encounter (Addendum)
Phone call from pt.  Reports she has contant pain in the left jaw and left neck incision.  Rates pain at 5-6/10.  States the pain with this surgery is worse than her 1st carotid surgery.  States her incision is dry and intact.  Denies any open area.  States there is some swelling and redness.  Denies any fever/chills. Denies any difficulty swallowing.  Denies difficulty breathing.  Asking for a refill on her Norco.   States the Tramadol didn't do any good.  Advised to try ES Tylenol or Ibuprofen this evening.  Will call pt. in AM to check on pain level.  Agrees w/ plan.

## 2013-05-21 NOTE — Telephone Encounter (Signed)
Phone call to pt. To check status this AM.  Reports the incisional pain was better this morning.  Reports some swelling in the left cheek, up to the cheek bone.  Reports that her nephew looked at the incision, and said there was a little redness @ one end of the incision.   C/o intermittent headache and rates at a 4/10.  States she doesn't want to drive over to [facility] today for pain medication, and will wait until Monday's appt. with Dr. Trula Slade.  States she had a hx of a blocked salivary gland several years ago, and questioned if this contributed to the discomfort.  Discussed with Dr. Donnetta Hutching; recommended to reassure pt. that with her recent surgical procedure, the amt. of discomfort and swelling in the facial area, is not unusual.  Advised to try Ibuprofen, and may apply warm or cold compresses to the affected area, as is comfortable to her.  Returned call to pt.  Given recommendations per Dr. Donnetta Hutching.   Advised to report worsening symptoms to the office, or go to the ER if after hours.  Verb. Understanding.

## 2013-05-22 ENCOUNTER — Encounter: Payer: Self-pay | Admitting: Surgery

## 2013-05-25 ENCOUNTER — Ambulatory Visit (INDEPENDENT_AMBULATORY_CARE_PROVIDER_SITE_OTHER): Payer: Self-pay | Admitting: Surgery

## 2013-05-25 ENCOUNTER — Encounter: Payer: Self-pay | Admitting: Surgery

## 2013-05-25 VITALS — BP 137/71 | HR 81 | Ht 64.5 in | Wt 165.4 lb

## 2013-05-25 DIAGNOSIS — Z48812 Encounter for surgical aftercare following surgery on the circulatory system: Secondary | ICD-10-CM

## 2013-05-25 DIAGNOSIS — I6529 Occlusion and stenosis of unspecified carotid artery: Secondary | ICD-10-CM

## 2013-05-25 NOTE — Progress Notes (Signed)
The patient is back today for followup.  On 05/07/2013, she underwent left carotid endarterectomy with bovine pericardial patch angioplasty.  I also resected a redundant left common carotid artery and performed a primary anastomosis.  Intraoperative findings included a 95% stenosis.  Postoperatively, there was concern over a left sided droop at the corner of her mouth from a marginal mandibular neurapraxia.  This has persisted somewhat as the patient has some difficulty keeping liquids in her mouth.  She has also complained of numbness and pain along her incision left side of her face which have been improving.  Other than the above, she is neurologically intact.  She denies extremity weakness or numbness.  She denies amaurosis fugax.  She denies slurred speech.  I reassured the patient that the marginal mandibular neurapraxia should resolve over time.  Likewise, her pain and loss of sensation should improve.  The sensation may not return completely.  She is scheduled to undergo lower extremity revascularization at the end of February.  Carotid perspective, she is cleared to do this.  She will followup with me again in 6 months.

## 2013-05-25 NOTE — Addendum Note (Signed)
Addended by: Dorthula Rue L on: 05/25/2013 04:04 PM   Modules accepted: Orders

## 2013-06-02 ENCOUNTER — Ambulatory Visit: Payer: Medicare Other | Admitting: Cardiovascular Disease

## 2013-07-07 ENCOUNTER — Encounter: Payer: Self-pay | Admitting: Cardiovascular Disease

## 2013-07-07 ENCOUNTER — Ambulatory Visit (INDEPENDENT_AMBULATORY_CARE_PROVIDER_SITE_OTHER): Payer: Medicare Other | Admitting: Cardiovascular Disease

## 2013-07-07 VITALS — BP 122/76 | HR 80 | Ht 64.5 in | Wt 169.9 lb

## 2013-07-07 DIAGNOSIS — Z01818 Encounter for other preprocedural examination: Secondary | ICD-10-CM

## 2013-07-07 DIAGNOSIS — R5381 Other malaise: Secondary | ICD-10-CM

## 2013-07-07 DIAGNOSIS — D689 Coagulation defect, unspecified: Secondary | ICD-10-CM

## 2013-07-07 DIAGNOSIS — Z79899 Other long term (current) drug therapy: Secondary | ICD-10-CM

## 2013-07-07 DIAGNOSIS — I10 Essential (primary) hypertension: Secondary | ICD-10-CM

## 2013-07-07 DIAGNOSIS — E785 Hyperlipidemia, unspecified: Secondary | ICD-10-CM

## 2013-07-07 DIAGNOSIS — R5383 Other fatigue: Secondary | ICD-10-CM

## 2013-07-07 DIAGNOSIS — I739 Peripheral vascular disease, unspecified: Secondary | ICD-10-CM

## 2013-07-07 DIAGNOSIS — I779 Disorder of arteries and arterioles, unspecified: Secondary | ICD-10-CM

## 2013-07-07 NOTE — Assessment & Plan Note (Signed)
On statin therapy followed by her PCP 

## 2013-07-07 NOTE — Patient Instructions (Addendum)
Dr. Gwenlyn Found has ordered a peripheral angiogram to be done at [facility].  This procedure is going to look at the bloodflow in your lower extremities.  If Dr. Gwenlyn Found is able to open up the arteries, you will have to spend one night in the hospital.  If he is not able to open the arteries, you will be able to go home that same day.    After the procedure, you will not be allowed to drive for 3 days or push, pull, or lift anything greater than 10 lbs for one week.    You will be required to have the following tests prior to the procedure:  1. Blood work-the blood work can be done no more than 7 days prior to the procedure.  It can be done at any [facility] lab.  There is one downstairs on the first floor of this building and one in the [place] ([address])    *REPS:  Melina Schools and Eric Bilateral groin puncture  Dr Gwenlyn Found has also ordered carotid dopplers to be done prior to the angiogram.

## 2013-07-07 NOTE — Assessment & Plan Note (Signed)
Controlled on current medications 

## 2013-07-07 NOTE — Progress Notes (Signed)
07/07/2013 Caitlin Oliver   10-Aug-1940  [id number]  Primary Physician Bing Matter, PA-C Primary Cardiologist: Lorretta Harp MD [name]   HPI:  Caitlin Oliver is a 73 year old mildly overweight widowed Caucasian female no children (son died 93 years ago) referred by Emmie Niemann PA-C at [facility] for evaluation and treatment of lifestyle limiting claudication. Risk factors included hypertension, hyperlipidemia and type 2 diabetes. She has a 75-pack-year history of tobacco abuse smoking one and a half packs a day. There's no family history of heart disease. She's never had a heart for stroke. She does complain of dyspnea but denies chest pain. She complains of bilateral lower extremity claudication while walking up an incline which is lifestyle limiting. I age of Imdur 02/25/13 revealing high-grade bilateral internal carotid artery stenosis, high-grade bilateral calcified iliac artery stenosis as most common femoral artery stenoses. She is up having staged right followed by left carotid endarterectomy performed by Dr. Rock Nephew problem which she has recovered from.   Current Outpatient Prescriptions  Medication Sig Dispense Refill  . amLODipine (NORVASC) 5 MG tablet Take 5 mg by mouth daily.      [name] aspirin EC 81 MG tablet Take 81 mg by mouth daily.      . diazepam (VALIUM) 5 MG tablet Take 5 mg by mouth every 12 (twelve) hours as needed. Anxiety      . fenofibrate micronized (LOFIBRA) 134 MG capsule Take 134 mg by mouth daily before breakfast.      . fish oil-omega-3 fatty acids 1000 MG capsule Take 1 g by mouth daily.      . hydrochlorothiazide (MICROZIDE) 12.5 MG capsule Take 12.5 mg by mouth daily.      [name] HYDROcodone-acetaminophen (NORCO/VICODIN) 5-325 MG per tablet Take 1-2 tablets by mouth every 6 (six) hours as needed for moderate pain.  30 tablet  0  . lisinopril (PRINIVIL,ZESTRIL) 20 MG tablet Take 20 mg by mouth daily.      . metFORMIN  (GLUCOPHAGE) 500 MG tablet Take 500 mg by mouth daily.       . metoCLOPramide (REGLAN) 10 MG tablet Take 10 mg by mouth 2 (two) times daily.       [name] omeprazole (PRILOSEC) 20 MG capsule Take 20 mg by mouth 2 (two) times daily before a meal.       . pravastatin (PRAVACHOL) 40 MG tablet Take 40 mg by mouth daily.      [name] amoxicillin (AMOXIL) 875 MG tablet Take 875 mg by mouth 2 (two) times daily.        No current facility-administered medications for this visit.    Allergies  Allergen Reactions  . Estrace [Estradiol] Swelling    Has allergy to patch, not the pill  . Oxycodone-Acetaminophen     Got the Shakes, mainly in her hands    History   Social History  . Marital Status: Widowed    Spouse Name: N/A    Number of Children: N/A  . Years of Education: N/A   Occupational History  . Not on file.   Social History Main Topics  . Smoking status: Former Smoker -- 1.50 packs/day for 56 years    Types: Cigarettes  . Smokeless tobacco: Former Systems developer    Quit date: 02/25/2013     Comment: Currently uses vapor (as of 07/07/2013)  . Alcohol Use: No  . Drug Use: No  . Sexual Activity: Not on file   Other Topics Concern  . Not  on file   Social History Narrative  . No narrative on file     Review of Systems: General: negative for chills, fever, night sweats or weight changes.  Cardiovascular: negative for chest pain, dyspnea on exertion, edema, orthopnea, palpitations, paroxysmal nocturnal dyspnea or shortness of breath Dermatological: negative for rash Respiratory: negative for cough or wheezing Urologic: negative for hematuria Abdominal: negative for nausea, vomiting, diarrhea, bright red blood per rectum, melena, or hematemesis Neurologic: negative for visual changes, syncope, or dizziness All other systems reviewed and are otherwise negative except as noted above.    Blood pressure 122/76, pulse 80, height 5' 4.5" (1.638 m), weight 169 lb 14.4 oz (77.066 kg).  General  appearance: alert and no distress Neck: no adenopathy, no JVD, supple, symmetrical, trachea midline, thyroid not enlarged, symmetric, no tenderness/mass/nodules and soft bilateral  carotid bruits Lungs: clear to auscultation bilaterally Heart: regular rate and rhythm, S1, S2 normal, no murmur, click, rub or gallop Extremities: extremities normal, atraumatic, no cyanosis or edema and 1+ femorals bilaterally with right greater than left bruit  EKG not performed today  ASSESSMENT AND PLAN:   Carotid artery disease Status post staged bilateral right followed by left carotid endarterectomy performed by Dr. Annamarie Major . The left procedure was back in January. She has soft bilateral carotid bruits. Abdomen to repeat carotid Doppler studies.  Claudication High-grade calcified bilateral ostial iliac stenoses as well as common femoral stenosis regular the left. Her right leg is more symptomatic but she does have lifestyle limiting claudication. She is three-vessel runoff with widely patent SFAs. My plan is to perform bilateral diamondback orbital rotational atherectomy, PTA and stenting.  Essential hypertension Controlled on current medications  Hyperlipidemia On statin therapy followed by her PCP      Lorretta Harp MD [facility], [facility] 07/07/2013 4:16 PM

## 2013-07-07 NOTE — Assessment & Plan Note (Signed)
High-grade calcified bilateral ostial iliac stenoses as well as common femoral stenosis regular the left. Her right leg is more symptomatic but she does have lifestyle limiting claudication. She is three-vessel runoff with widely patent SFAs. My plan is to perform bilateral diamondback orbital rotational atherectomy, PTA and stenting.

## 2013-07-07 NOTE — Assessment & Plan Note (Signed)
Status post staged bilateral right followed by left carotid endarterectomy performed by Dr. Annamarie Major . The left procedure was back in January. She has soft bilateral carotid bruits. Abdomen to repeat carotid Doppler studies.

## 2013-07-08 ENCOUNTER — Encounter: Payer: Self-pay | Admitting: Cardiovascular Disease

## 2013-07-13 ENCOUNTER — Encounter (HOSPITAL_COMMUNITY): Payer: Self-pay | Admitting: Pharmacy Technician

## 2013-07-17 ENCOUNTER — Ambulatory Visit (HOSPITAL_COMMUNITY)
Admission: RE | Admit: 2013-07-17 | Discharge: 2013-07-17 | Disposition: A | Discharge status: Home / Self Care | Payer: Medicare Other | Source: Ambulatory Visit | Attending: Cardiovascular Disease | Admitting: Cardiovascular Disease

## 2013-07-17 DIAGNOSIS — I6529 Occlusion and stenosis of unspecified carotid artery: Secondary | ICD-10-CM

## 2013-07-17 DIAGNOSIS — I739 Peripheral vascular disease, unspecified: Secondary | ICD-10-CM

## 2013-07-17 DIAGNOSIS — I779 Disorder of arteries and arterioles, unspecified: Secondary | ICD-10-CM

## 2013-07-17 NOTE — Progress Notes (Addendum)
Carotid Duplex completed. Oda Cogan, BS, RDMS, RVT

## 2013-07-20 LAB — BASIC METABOLIC PANEL
BUN: 19 mg/dL (ref 6–23)
CO2: 29 meq/L (ref 19–32)
CREATININE: 0.84 mg/dL (ref 0.50–1.10)
Calcium: 9.5 mg/dL (ref 8.4–10.5)
Chloride: 98 mEq/L (ref 96–112)
Glucose, Bld: 87 mg/dL (ref 70–99)
Potassium: 4.3 mEq/L (ref 3.5–5.3)
SODIUM: 139 meq/L (ref 135–145)

## 2013-07-20 LAB — CBC
HEMATOCRIT: 45.6 % (ref 36.0–46.0)
HEMOGLOBIN: 15.1 g/dL — AB (ref 12.0–15.0)
MCH: 30.1 pg (ref 26.0–34.0)
MCHC: 33.1 g/dL (ref 30.0–36.0)
MCV: 90.8 fL (ref 78.0–100.0)
Platelets: 328 10*3/uL (ref 150–400)
RBC: 5.02 MIL/uL (ref 3.87–5.11)
RDW: 13.8 % (ref 11.5–15.5)
WBC: 6.7 10*3/uL (ref 4.0–10.5)

## 2013-07-20 LAB — TSH: TSH: 1.301 u[IU]/mL (ref 0.350–4.500)

## 2013-07-20 LAB — PROTIME-INR
INR: 0.94 (ref <1.50)
Prothrombin Time: 12.5 seconds (ref 11.6–15.2)

## 2013-07-20 LAB — APTT: APTT: 31 s (ref 24–37)

## 2013-07-23 ENCOUNTER — Telehealth: Payer: Self-pay | Admitting: *Deleted

## 2013-07-23 ENCOUNTER — Encounter: Payer: Self-pay | Admitting: *Deleted

## 2013-07-23 DIAGNOSIS — I6529 Occlusion and stenosis of unspecified carotid artery: Secondary | ICD-10-CM

## 2013-07-23 NOTE — Telephone Encounter (Signed)
Order placed for repeat carotid dopplers in 6 months

## 2013-07-23 NOTE — Telephone Encounter (Signed)
Message copied by Chauncy Lean on Thu Jul 23, 2013  1:19 PM ------      Message from: Lorretta Harp      Created: Thu Jul 23, 2013  6:33 AM       No change from prior study. Repeat in 6 months ------

## 2013-07-27 ENCOUNTER — Encounter: Payer: Self-pay | Admitting: *Deleted

## 2013-07-27 ENCOUNTER — Encounter (HOSPITAL_COMMUNITY)
Admission: RE | Disposition: A | Discharge status: Home / Self Care | Payer: Self-pay | Source: Ambulatory Visit | Attending: Cardiovascular Disease

## 2013-07-27 ENCOUNTER — Ambulatory Visit (HOSPITAL_COMMUNITY)
Admission: RE | Admit: 2013-07-27 | Discharge: 2013-07-29 | Disposition: A | Discharge status: Home / Self Care | Payer: Medicare Other | Source: Ambulatory Visit | Attending: Cardiovascular Disease | Admitting: Cardiovascular Disease

## 2013-07-27 ENCOUNTER — Encounter (HOSPITAL_COMMUNITY): Payer: Self-pay | Admitting: *Deleted

## 2013-07-27 DIAGNOSIS — Z7982 Long term (current) use of aspirin: Secondary | ICD-10-CM | POA: Insufficient documentation

## 2013-07-27 DIAGNOSIS — E785 Hyperlipidemia, unspecified: Secondary | ICD-10-CM | POA: Insufficient documentation

## 2013-07-27 DIAGNOSIS — Z9889 Other specified postprocedural states: Secondary | ICD-10-CM | POA: Insufficient documentation

## 2013-07-27 DIAGNOSIS — R0609 Other forms of dyspnea: Secondary | ICD-10-CM | POA: Insufficient documentation

## 2013-07-27 DIAGNOSIS — Z01818 Encounter for other preprocedural examination: Secondary | ICD-10-CM

## 2013-07-27 DIAGNOSIS — F411 Generalized anxiety disorder: Secondary | ICD-10-CM | POA: Insufficient documentation

## 2013-07-27 DIAGNOSIS — Z87891 Personal history of nicotine dependence: Secondary | ICD-10-CM | POA: Insufficient documentation

## 2013-07-27 DIAGNOSIS — I959 Hypotension, unspecified: Secondary | ICD-10-CM

## 2013-07-27 DIAGNOSIS — I739 Peripheral vascular disease, unspecified: Secondary | ICD-10-CM

## 2013-07-27 DIAGNOSIS — I70219 Atherosclerosis of native arteries of extremities with intermittent claudication, unspecified extremity: Secondary | ICD-10-CM

## 2013-07-27 DIAGNOSIS — I10 Essential (primary) hypertension: Secondary | ICD-10-CM | POA: Insufficient documentation

## 2013-07-27 DIAGNOSIS — Z720 Tobacco use: Secondary | ICD-10-CM

## 2013-07-27 DIAGNOSIS — E119 Type 2 diabetes mellitus without complications: Secondary | ICD-10-CM | POA: Insufficient documentation

## 2013-07-27 DIAGNOSIS — R0989 Other specified symptoms and signs involving the circulatory and respiratory systems: Secondary | ICD-10-CM | POA: Insufficient documentation

## 2013-07-27 HISTORY — PX: LOWER EXTREMITY ANGIOGRAM: SHX5508

## 2013-07-27 LAB — POCT ACTIVATED CLOTTING TIME
ACTIVATED CLOTTING TIME: 210 s
ACTIVATED CLOTTING TIME: 193 s
Activated Clotting Time: 238 seconds
Activated Clotting Time: 160 seconds

## 2013-07-27 LAB — MRSA PCR SCREENING: MRSA by PCR: NEGATIVE

## 2013-07-27 LAB — GLUCOSE, CAPILLARY
GLUCOSE-CAPILLARY: 104 mg/dL — AB (ref 70–99)
Glucose-Capillary: 92 mg/dL (ref 70–99)

## 2013-07-27 SURGERY — ANGIOGRAM, LOWER EXTREMITY
Anesthesia: LOCAL

## 2013-07-27 MED ORDER — SODIUM CHLORIDE 0.9 % IJ SOLN: 3.0000 mL | INTRAMUSCULAR | Status: DC | PRN

## 2013-07-27 MED ORDER — FENTANYL CITRATE 0.05 MG/ML IJ SOLN
INTRAMUSCULAR | Status: AC
  Filled 2013-07-27: qty 2

## 2013-07-27 MED ORDER — HEPARIN (PORCINE) IN NACL 2-0.9 UNIT/ML-% IJ SOLN
INTRAMUSCULAR | Status: AC
  Filled 2013-07-27: qty 1000

## 2013-07-27 MED ORDER — CLOPIDOGREL BISULFATE 300 MG PO TABS
ORAL_TABLET | ORAL | Status: AC
  Filled 2013-07-27: qty 1

## 2013-07-27 MED ORDER — AMLODIPINE BESYLATE 5 MG PO TABS
5.0000 mg | ORAL_TABLET | Freq: Every day | ORAL | Status: DC
  Administered 2013-07-28 – 2013-07-29 (×2): 5 mg via ORAL
  Filled 2013-07-27 (×2): qty 1

## 2013-07-27 MED ORDER — PANTOPRAZOLE SODIUM 40 MG PO TBEC
40.0000 mg | DELAYED_RELEASE_TABLET | Freq: Every day | ORAL | Status: DC
  Administered 2013-07-28 – 2013-07-29 (×2): 40 mg via ORAL
  Filled 2013-07-27 (×2): qty 1

## 2013-07-27 MED ORDER — LABETALOL HCL 5 MG/ML IV SOLN
20.0000 mg | Freq: Once | INTRAVENOUS | Status: AC
  Administered 2013-07-27: 20 mg via INTRAVENOUS

## 2013-07-27 MED ORDER — ASPIRIN 81 MG PO CHEW
81.0000 mg | CHEWABLE_TABLET | ORAL | Status: AC
  Administered 2013-07-27: 81 mg via ORAL

## 2013-07-27 MED ORDER — VERAPAMIL HCL 2.5 MG/ML IV SOLN
INTRAVENOUS | Status: AC
  Filled 2013-07-27: qty 2

## 2013-07-27 MED ORDER — ASPIRIN 81 MG PO CHEW
81.0000 mg | CHEWABLE_TABLET | Freq: Every day | ORAL | Status: DC
  Administered 2013-07-28 – 2013-07-29 (×2): 81 mg via ORAL
  Filled 2013-07-27 (×2): qty 1

## 2013-07-27 MED ORDER — NITROGLYCERIN 0.2 MG/ML ON CALL CATH LAB
INTRAVENOUS | Status: AC
  Filled 2013-07-27: qty 1

## 2013-07-27 MED ORDER — HEPARIN SODIUM (PORCINE) 1000 UNIT/ML IJ SOLN
INTRAMUSCULAR | Status: AC
  Filled 2013-07-27: qty 1

## 2013-07-27 MED ORDER — HYDROCHLOROTHIAZIDE 12.5 MG PO CAPS
12.5000 mg | ORAL_CAPSULE | Freq: Every day | ORAL | Status: DC
  Administered 2013-07-28 – 2013-07-29 (×2): 12.5 mg via ORAL
  Filled 2013-07-27 (×2): qty 1

## 2013-07-27 MED ORDER — LABETALOL HCL 5 MG/ML IV SOLN
INTRAVENOUS | Status: AC
  Filled 2013-07-27: qty 4

## 2013-07-27 MED ORDER — MORPHINE SULFATE 2 MG/ML IJ SOLN
INTRAMUSCULAR | Status: AC
  Filled 2013-07-27: qty 1

## 2013-07-27 MED ORDER — ASPIRIN EC 81 MG PO TBEC: 81.0000 mg | DELAYED_RELEASE_TABLET | Freq: Every day | ORAL | Status: DC

## 2013-07-27 MED ORDER — ASPIRIN 81 MG PO CHEW
CHEWABLE_TABLET | ORAL | Status: AC
  Filled 2013-07-27: qty 1

## 2013-07-27 MED ORDER — FENTANYL CITRATE 0.05 MG/ML IJ SOLN
50.0000 ug | Freq: Once | INTRAMUSCULAR | Status: AC
  Administered 2013-07-27: 50 ug via INTRAVENOUS
  Filled 2013-07-27: qty 2

## 2013-07-27 MED ORDER — SODIUM CHLORIDE 0.9 % IV SOLN
Freq: Once | INTRAVENOUS | Status: AC
  Administered 2013-07-27: 1000 mL via INTRAVENOUS

## 2013-07-27 MED ORDER — NITROGLYCERIN IN D5W 200-5 MCG/ML-% IV SOLN
3.0000 ug/min | INTRAVENOUS | Status: DC
  Administered 2013-07-27: 3 ug/min via INTRAVENOUS

## 2013-07-27 MED ORDER — ONDANSETRON HCL 4 MG/2ML IJ SOLN
4.0000 mg | Freq: Once | INTRAMUSCULAR | Status: AC
  Administered 2013-07-27: 4 mg via INTRAVENOUS

## 2013-07-27 MED ORDER — HYDROCODONE-ACETAMINOPHEN 5-325 MG PO TABS
1.0000 | ORAL_TABLET | Freq: Four times a day (QID) | ORAL | Status: DC | PRN
  Administered 2013-07-28 (×2): 2 via ORAL
  Administered 2013-07-28 – 2013-07-29 (×3): 1 via ORAL
  Filled 2013-07-27: qty 1
  Filled 2013-07-27: qty 2
  Filled 2013-07-27: qty 1
  Filled 2013-07-27: qty 2
  Filled 2013-07-27: qty 1
  Filled 2013-07-27 (×2): qty 2

## 2013-07-27 MED ORDER — FENTANYL CITRATE 0.05 MG/ML IJ SOLN
25.0000 ug | INTRAMUSCULAR | Status: DC | PRN
  Administered 2013-07-27: 50 ug via INTRAVENOUS
  Administered 2013-07-28: 25 ug via INTRAVENOUS
  Filled 2013-07-27: qty 2

## 2013-07-27 MED ORDER — LIDOCAINE HCL (PF) 1 % IJ SOLN
INTRAMUSCULAR | Status: AC
  Filled 2013-07-27: qty 30

## 2013-07-27 MED ORDER — ONDANSETRON HCL 4 MG/2ML IJ SOLN
INTRAMUSCULAR | Status: AC
  Filled 2013-07-27: qty 2

## 2013-07-27 MED ORDER — DIAZEPAM 5 MG PO TABS
5.0000 mg | ORAL_TABLET | ORAL | Status: DC
Start: 2013-07-27 — End: 2013-07-27

## 2013-07-27 MED ORDER — CLOPIDOGREL BISULFATE 75 MG PO TABS
75.0000 mg | ORAL_TABLET | Freq: Every day | ORAL | Status: DC
  Administered 2013-07-28 – 2013-07-29 (×2): 75 mg via ORAL
  Filled 2013-07-27 (×2): qty 1

## 2013-07-27 MED ORDER — METOCLOPRAMIDE HCL 10 MG PO TABS
10.0000 mg | ORAL_TABLET | Freq: Two times a day (BID) | ORAL | Status: DC
  Administered 2013-07-27 – 2013-07-29 (×4): 10 mg via ORAL
  Filled 2013-07-27 (×6): qty 1

## 2013-07-27 MED ORDER — SODIUM CHLORIDE 0.9 % IV SOLN
INTRAVENOUS | Status: DC
  Administered 2013-07-27: 14:00:00 via INTRAVENOUS

## 2013-07-27 MED ORDER — SODIUM CHLORIDE 0.9 % IV SOLN
INTRAVENOUS | Status: AC
  Administered 2013-07-27: 1000 mL via INTRAVENOUS

## 2013-07-27 MED ORDER — MORPHINE SULFATE 2 MG/ML IJ SOLN
1.0000 mg | INTRAMUSCULAR | Status: DC | PRN
  Administered 2013-07-27: 1 mg via INTRAVENOUS

## 2013-07-27 MED ORDER — MIDAZOLAM HCL 2 MG/2ML IJ SOLN
INTRAMUSCULAR | Status: AC
  Filled 2013-07-27: qty 2

## 2013-07-27 MED ORDER — LISINOPRIL 20 MG PO TABS
20.0000 mg | ORAL_TABLET | Freq: Every day | ORAL | Status: DC
  Administered 2013-07-28 – 2013-07-29 (×2): 20 mg via ORAL
  Filled 2013-07-27 (×2): qty 1

## 2013-07-27 MED ORDER — DIAZEPAM 5 MG PO TABS
5.0000 mg | ORAL_TABLET | Freq: Two times a day (BID) | ORAL | Status: DC | PRN
  Administered 2013-07-28 (×2): 5 mg via ORAL
  Filled 2013-07-27 (×3): qty 1

## 2013-07-27 NOTE — H&P (View-Only) (Signed)
07/07/2013 [name]   10-Aug-1940  [id number]  Primary Physician Bing Matter, PA-C Primary Cardiologist: Lorretta Harp MD [name]   HPI:  Caitlin Oliver is a 73 year old mildly overweight widowed Caucasian female no children (son died 93 years ago) referred by Emmie Niemann PA-C at [facility] for evaluation and treatment of lifestyle limiting claudication. Risk factors included hypertension, hyperlipidemia and type 2 diabetes. She has a 75-pack-year history of tobacco abuse smoking one and a half packs a day. There's no family history of heart disease. She's never had a heart for stroke. She does complain of dyspnea but denies chest pain. She complains of bilateral lower extremity claudication while walking up an incline which is lifestyle limiting. I age of Imdur 02/25/13 revealing high-grade bilateral internal carotid artery stenosis, high-grade bilateral calcified iliac artery stenosis as most common femoral artery stenoses. She is up having staged right followed by left carotid endarterectomy performed by Dr. Rock Nephew problem which she has recovered from.   Current Outpatient Prescriptions  Medication Sig Dispense Refill  . amLODipine (NORVASC) 5 MG tablet Take 5 mg by mouth daily.      [name] aspirin EC 81 MG tablet Take 81 mg by mouth daily.      . diazepam (VALIUM) 5 MG tablet Take 5 mg by mouth every 12 (twelve) hours as needed. Anxiety      . fenofibrate micronized (LOFIBRA) 134 MG capsule Take 134 mg by mouth daily before breakfast.      . fish oil-omega-3 fatty acids 1000 MG capsule Take 1 g by mouth daily.      . hydrochlorothiazide (MICROZIDE) 12.5 MG capsule Take 12.5 mg by mouth daily.      [name] HYDROcodone-acetaminophen (NORCO/VICODIN) 5-325 MG per tablet Take 1-2 tablets by mouth every 6 (six) hours as needed for moderate pain.  30 tablet  0  . lisinopril (PRINIVIL,ZESTRIL) 20 MG tablet Take 20 mg by mouth daily.      . metFORMIN  (GLUCOPHAGE) 500 MG tablet Take 500 mg by mouth daily.       . metoCLOPramide (REGLAN) 10 MG tablet Take 10 mg by mouth 2 (two) times daily.       [name] omeprazole (PRILOSEC) 20 MG capsule Take 20 mg by mouth 2 (two) times daily before a meal.       . pravastatin (PRAVACHOL) 40 MG tablet Take 40 mg by mouth daily.      [name] amoxicillin (AMOXIL) 875 MG tablet Take 875 mg by mouth 2 (two) times daily.        No current facility-administered medications for this visit.    Allergies  Allergen Reactions  . Estrace [Estradiol] Swelling    Has allergy to patch, not the pill  . Oxycodone-Acetaminophen     Got the Shakes, mainly in her hands    History   Social History  . Marital Status: Widowed    Spouse Name: N/A    Number of Children: N/A  . Years of Education: N/A   Occupational History  . Not on file.   Social History Main Topics  . Smoking status: Former Smoker -- 1.50 packs/day for 56 years    Types: Cigarettes  . Smokeless tobacco: Former Systems developer    Quit date: 02/25/2013     Comment: Currently uses vapor (as of 07/07/2013)  . Alcohol Use: No  . Drug Use: No  . Sexual Activity: Not on file   Other Topics Concern  . Not  on file   Social History Narrative  . No narrative on file     Review of Systems: General: negative for chills, fever, night sweats or weight changes.  Cardiovascular: negative for chest pain, dyspnea on exertion, edema, orthopnea, palpitations, paroxysmal nocturnal dyspnea or shortness of breath Dermatological: negative for rash Respiratory: negative for cough or wheezing Urologic: negative for hematuria Abdominal: negative for nausea, vomiting, diarrhea, bright red blood per rectum, melena, or hematemesis Neurologic: negative for visual changes, syncope, or dizziness All other systems reviewed and are otherwise negative except as noted above.    Blood pressure 122/76, pulse 80, height 5' 4.5" (1.638 m), weight 169 lb 14.4 oz (77.066 kg).  General  appearance: alert and no distress Neck: no adenopathy, no JVD, supple, symmetrical, trachea midline, thyroid not enlarged, symmetric, no tenderness/mass/nodules and soft bilateral  carotid bruits Lungs: clear to auscultation bilaterally Heart: regular rate and rhythm, S1, S2 normal, no murmur, click, rub or gallop Extremities: extremities normal, atraumatic, no cyanosis or edema and 1+ femorals bilaterally with right greater than left bruit  EKG not performed today  ASSESSMENT AND PLAN:   Carotid artery disease Status post staged bilateral right followed by left carotid endarterectomy performed by Dr. Annamarie Major . The left procedure was back in January. She has soft bilateral carotid bruits. Abdomen to repeat carotid Doppler studies.  Claudication High-grade calcified bilateral ostial iliac stenoses as well as common femoral stenosis regular the left. Her right leg is more symptomatic but she does have lifestyle limiting claudication. She is three-vessel runoff with widely patent SFAs. My plan is to perform bilateral diamondback orbital rotational atherectomy, PTA and stenting.  Essential hypertension Controlled on current medications  Hyperlipidemia On statin therapy followed by her PCP      Lorretta Harp MD [facility], [facility] 07/07/2013 4:16 PM

## 2013-07-27 NOTE — CV Procedure (Signed)
[name] is a 73 y.o. female    [id number] LOCATION:  FACILITY: [facility]  PHYSICIAN: Caitlin Oliver, M.D. 01/05/1941   DATE OF PROCEDURE:  07/27/2013  DATE OF DISCHARGE:     PV Angiogram/Intervention    History obtained from chart review.Caitlin Oliver is a 73 year old mildly overweight widowed Caucasian female no children (son died 41 years ago) referred by Emmie Niemann PA-C at [facility] for evaluation and treatment of lifestyle limiting claudication. Risk factors included hypertension, hyperlipidemia and type 2 diabetes. She has a 75-pack-year history of tobacco abuse smoking one and a half packs a day. There's no family history of heart disease. She's never had a heart for stroke. She does complain of dyspnea but denies chest pain. She complains of bilateral lower extremity claudication while walking up an incline which is lifestyle limiting. I age of Imdur 02/25/13 revealing high-grade bilateral internal carotid artery stenosis, high-grade bilateral calcified iliac artery stenosis as most common femoral artery stenoses. She had staged right followed by left carotid endarterectomy performed by Dr. Rock Nephew problem which she has recovered from. Were percutaneously position of her high-grade bilateral calcified ostial iliac stenoses using diamondback orbital rotational atherectomy, PTA and stenting with ICast covered stents.   PROCEDURE DESCRIPTION:   The patient was brought to the second floor [facility] cath lab in the postabsorptive state. She was premedicated with Valium 5 mg by mouth, IV Versed and fentanyl. Her right and left groinsWere prepped and shaved in usual sterile fashion. Xylocaine 1% was used for local anesthesia. 7 [place] sheaths  Were inserted into the right and left common femoral arteries using standard Seldinger technique. Visipaque dye was used for the entirety of the case. A retrograde aortic pressure was monitored during the  case.   HEMODYNAMICS:    AO SYSTOLIC/AO DIASTOLIC: 151/76   Angiographic Data:   The 7 French Bright tip sheaths were both advanced into the common iliac arteries bilaterally. A total of 7500 units of heparin was administered the ACT of 238 falling to 193 at the end of the case. A total of 123 cc of Visipaque dye was administered to the patient. I performed diamondback orbital or facial fracture and using a 2 mm burr over a 0.14 Viper wire on both sides. Following this "kissing balloon" antibody was performed over 0.35 Versicore wires using 6 mm x 3 cm balloons. Kissing balloon stenting was then performed with 8 mm x 38 mm long ICast covered stents a nominal pressures. The patient did complain of abdominal pain during eluding stent inflation which resolved gradually after deflation. The final antibiotic result with reduction of 95% calcified ostial bilateral common iliac artery stenoses to 0% residual with excellent flow no dissection. The right hip sheath were then exchanged for a short 7 French sheath in the left side was upgraded to a because of bruising. The patient was somewhat hypertensive due to the pain and was started on low-dose IV nitroglycerin. She left the lab in stable condition.  IMPRESSION:successful bilateral calcified ostial common iliac artery diamondback orbital rotational arthrectomy, PTA and stenting using ICast covered stents for lifestyle limiting claudication.the patient left the lab in stable condition. The sheaths will be removed and pressure will be held on both groins to achieve hemostasis. The patient left the Cath Lab in  condition. She did receive 300 mg of by mouth Plavix and will need to be on dual antiplatelet therapy. She'll be hydrated overnight, discharged home in the morning.  I will  get followup arterial Doppler studies after which she'll see me back in the office.    Lorretta Harp. MD, [facility] 07/27/2013 4:52 PM

## 2013-07-27 NOTE — Interval H&P Note (Signed)
History and Physical Interval Note:  07/27/2013 3:15 PM  Caitlin Oliver  has presented today for surgery, with the diagnosis of PAD  The various methods of treatment have been discussed with the patient and family. After consideration of risks, benefits and other options for treatment, the patient has consented to  Procedure(s): LOWER EXTREMITY ANGIOGRAM (N/A) as a surgical intervention .  The patient's history has been reviewed, patient examined, no change in status, stable for surgery.  I have reviewed the patient's chart and labs.  Questions were answered to the patient's satisfaction.     Lorretta Harp

## 2013-07-28 ENCOUNTER — Encounter (HOSPITAL_COMMUNITY): Payer: Self-pay | Admitting: *Deleted

## 2013-07-28 DIAGNOSIS — I739 Peripheral vascular disease, unspecified: Secondary | ICD-10-CM

## 2013-07-28 DIAGNOSIS — I959 Hypotension, unspecified: Secondary | ICD-10-CM

## 2013-07-28 DIAGNOSIS — Z01818 Encounter for other preprocedural examination: Secondary | ICD-10-CM

## 2013-07-28 LAB — CBC
HCT: 41.7 % (ref 36.0–46.0)
HEMATOCRIT: 41.9 % (ref 36.0–46.0)
HEMOGLOBIN: 14 g/dL (ref 12.0–15.0)
Hemoglobin: 13.8 g/dL (ref 12.0–15.0)
MCH: 31.2 pg (ref 26.0–34.0)
MCH: 31.2 pg (ref 26.0–34.0)
MCHC: 33.4 g/dL (ref 30.0–36.0)
MCHC: 33.1 g/dL (ref 30.0–36.0)
MCV: 93.3 fL (ref 78.0–100.0)
MCV: 94.1 fL (ref 78.0–100.0)
PLATELETS: 278 10*3/uL (ref 150–400)
Platelets: 273 10*3/uL (ref 150–400)
RBC: 4.49 MIL/uL (ref 3.87–5.11)
RBC: 4.43 MIL/uL (ref 3.87–5.11)
RDW: 13.8 % (ref 11.5–15.5)
RDW: 13.9 % (ref 11.5–15.5)
WBC: 13.8 10*3/uL — AB (ref 4.0–10.5)
WBC: 10.6 10*3/uL — AB (ref 4.0–10.5)

## 2013-07-28 LAB — BASIC METABOLIC PANEL
BUN: 24 mg/dL — ABNORMAL HIGH (ref 6–23)
CHLORIDE: 103 meq/L (ref 96–112)
CO2: 26 mEq/L (ref 19–32)
Calcium: 9.1 mg/dL (ref 8.4–10.5)
Creatinine, Ser: 0.75 mg/dL (ref 0.50–1.10)
GFR calc Af Amer: >90 mL/min (ref >90)
GFR calc non Af Amer: 83 mL/min — ABNORMAL LOW (ref >90)
Glucose, Bld: 129 mg/dL — ABNORMAL HIGH (ref 70–99)
POTASSIUM: 4.4 meq/L (ref 3.7–5.3)
Sodium: 142 mEq/L (ref 137–147)

## 2013-07-28 MED ORDER — SODIUM CHLORIDE 0.9 % IV SOLN
INTRAVENOUS | Status: AC
  Administered 2013-07-28 – 2013-07-29 (×2): via INTRAVENOUS

## 2013-07-28 NOTE — Progress Notes (Signed)
   Subjective:  No chest pain or sob;  complains of discomfort groin  Objective:   Vital Signs in the last 24 hours: Temp:  [97.9 F (36.6 C)-98.4 F (36.9 C)] 98.4 F (36.9 C) (04/14 1144) Pulse Rate:  [62-87] 71 (04/14 1200) Resp:  [13-23] 15 (04/14 1200) BP: (91-184)/(33-78) 95/33 mmHg (04/14 1200) SpO2:  [90 %-100 %] 91 % (04/14 1200) Weight:  [167 lb 15.9 oz (76.2 kg)-169 lb (76.658 kg)] 167 lb 15.9 oz (76.2 kg) (04/14 0346)  Intake/Output from previous day: 04/13 0701 - 04/14 0700 In: 1528.1 [P.O.:720; I.V.:808.1] Out: 500 [Urine:500]  Medications: . amLODipine  5 mg Oral Daily  . aspirin  81 mg Oral Daily  . clopidogrel  75 mg Oral Q breakfast  . hydrochlorothiazide  12.5 mg Oral Daily  . lisinopril  20 mg Oral Daily  . metoCLOPramide  10 mg Oral BID  . pantoprazole  40 mg Oral Daily    . nitroGLYCERIN Stopped (07/27/13 2230)    Physical Exam:   General appearance: alert, cooperative and no distress Neck: no adenopathy, no JVD, supple, symmetrical, trachea midline and thyroid not enlarged, symmetric, no tenderness/mass/nodules Lungs: decreased BS mild expiratory rhochi/wheeze Heart: regular rate and rhythm and 1/6 sem Abdomen: soft, non-tender; bowel sounds normal; no masses,  no organomegaly Extremities: ecchymosis left groin Pulses: 2+ and symmetric Neurologic: Grossly normal   Rate:80-90  Rhythm: normal sinus rhythm  Lab Results:    Recent Labs  07/28/13 0304  NA 142  K 4.4  CL 103  CO2 26  GLUCOSE 129*  BUN 24*  CREATININE 0.75   No results found for this basename: TROPONINI, CK, MB,  in the last 72 hours Hepatic Function Panel No results found for this basename: PROT, ALBUMIN, AST, ALT, ALKPHOS, BILITOT, BILIDIR, IBILI,  in the last 72 hours No results found for this basename: INR,  in the last 72 hours BNP (last 3 results) No results found for this basename: PROBNP,  in the last 8760 hours  Lipid Panel  No results found for this  basename: chol, trig, hdl, cholhdl, vldl, ldlcalc      Imaging:  No results found.    Assessment/Plan:   Active Problems:   Claudication  Day 1 s/p bilateral calcified ostial common iliac artery diamondback orbital rotational arthrectomy, PTA and stenting using ICast covered stents for lifestyle limiting claudication. She became hypotensive last night and today to mid 00'F systolic after valium.  BP now 123/47. Will give additional mild hydration. F/U H/H. Will keep today per Dr. Gwenlyn Found and plan DC tomorrow.   Troy Sine, MD, [facility] 07/28/2013, 12:44 PM

## 2013-07-29 ENCOUNTER — Other Ambulatory Visit: Payer: Self-pay | Admitting: Cardiology

## 2013-07-29 DIAGNOSIS — E119 Type 2 diabetes mellitus without complications: Secondary | ICD-10-CM

## 2013-07-29 DIAGNOSIS — I10 Essential (primary) hypertension: Secondary | ICD-10-CM

## 2013-07-29 DIAGNOSIS — I739 Peripheral vascular disease, unspecified: Secondary | ICD-10-CM

## 2013-07-29 DIAGNOSIS — F17200 Nicotine dependence, unspecified, uncomplicated: Secondary | ICD-10-CM

## 2013-07-29 DIAGNOSIS — E785 Hyperlipidemia, unspecified: Secondary | ICD-10-CM

## 2013-07-29 LAB — CBC
HCT: 41.6 % (ref 36.0–46.0)
HEMOGLOBIN: 13.8 g/dL (ref 12.0–15.0)
MCH: 31.2 pg (ref 26.0–34.0)
MCHC: 33.2 g/dL (ref 30.0–36.0)
MCV: 94.1 fL (ref 78.0–100.0)
Platelets: 271 10*3/uL (ref 150–400)
RBC: 4.42 MIL/uL (ref 3.87–5.11)
RDW: 13.6 % (ref 11.5–15.5)
WBC: 7.4 10*3/uL (ref 4.0–10.5)

## 2013-07-29 LAB — BASIC METABOLIC PANEL
BUN: 14 mg/dL (ref 6–23)
CO2: 27 meq/L (ref 19–32)
Calcium: 9.4 mg/dL (ref 8.4–10.5)
Chloride: 101 mEq/L (ref 96–112)
Creatinine, Ser: 0.58 mg/dL (ref 0.50–1.10)
GFR calc Af Amer: >90 mL/min (ref >90)
GFR calc non Af Amer: 90 mL/min — ABNORMAL LOW (ref >90)
GLUCOSE: 135 mg/dL — AB (ref 70–99)
Potassium: 4.1 mEq/L (ref 3.7–5.3)
SODIUM: 139 meq/L (ref 137–147)

## 2013-07-29 LAB — GLUCOSE, CAPILLARY: Glucose-Capillary: 100 mg/dL — ABNORMAL HIGH (ref 70–99)

## 2013-07-29 MED ORDER — PANTOPRAZOLE SODIUM 40 MG PO TBEC: 40.0000 mg | DELAYED_RELEASE_TABLET | Freq: Every day | ORAL | Status: DC

## 2013-07-29 MED ORDER — NEBIVOLOL HCL 2.5 MG PO TABS
2.5000 mg | ORAL_TABLET | Freq: Every day | ORAL | Status: DC
  Filled 2013-07-29: qty 1

## 2013-07-29 MED ORDER — NEBIVOLOL HCL 2.5 MG PO TABS: 2.5000 mg | ORAL_TABLET | Freq: Every day | ORAL | Status: DC

## 2013-07-29 MED ORDER — CLOPIDOGREL BISULFATE 75 MG PO TABS: 75.0000 mg | ORAL_TABLET | Freq: Every day | ORAL | Status: DC

## 2013-07-29 NOTE — Discharge Summary (Signed)
D/c'd to car via [place]. D/C instructions given by prior Rn Leonia Corona. Patient with no complaints of pain at the current time.

## 2013-07-29 NOTE — Discharge Summary (Signed)
Physician Discharge Summary  Patient ID: Caitlin Oliver MRN: [id number] DOB/AGE: 06-01-1940 73 y.o.  Admit date: 07/27/2013 Discharge date: 07/29/2013  Admission Diagnoses:  PVD with Claudication  Discharge Diagnoses:  Principal Problem:   PVD (peripheral vascular disease) with claudication Active Problems:   Claudication   Discharged Condition: stable  Hospital Course: The patient is a 73 year old female, referred to Dr. Gwenlyn Found by Emmie Niemann, PA-C, at [facility] at [facility] for evaluation and treatment of lifestyle limiting claudication. Risk factors included hypertension, hyperlipidemia and type 2 diabetes. She has a 75-pack-year history of tobacco abuse, smoking one and a half packs a day. There's no family history of heart disease. She's never had a MI or stroke. She does complain of dyspnea but denies chest pain. She had staged right followed by left carotid endarterectomy performed by Dr. Rock Nephew problem which she has recovered from.   She was seen by Dr. Gwenlyn Found in clinic and complained of bilateral lower extremity claudication while walking up an incline which is lifestyle limiting. He ordered lower extremity arterial doppler studies which revealed abnormal bilateral ABIs consistent with arterial insufficieny. He recommended a PV angiogram. She was admitted to [facility] on 4/13 for the planned procedure. She underwent successful bilateral calcified ostial common iliac artery diamondback orbital rotational arthrectomy, PTA and stenting using ICast covered stents. She tolerated the procedure well and left the cath lab in stable condition. She was placed on DAPT with ASA + Plavix. She was kept overnight for hydration and observation. She had no post cath complications. Her groin access site remained stable. She had no pain ambulating. Renal function remained stable. She did however have slight elevations in her BP and Dr. Claiborne Billings opted to start her on a low dose of Bystolic. She was  last seen and examined by Dr. Claiborne Billings who determined she was stable for discharge home. LEAs have been arranged. She will f/u with Dr. Gwenlyn Found on 08/28/13.   Consults: none  Significant Diagnostic Studies:   PV Angio 07/27/13 IMPRESSION:successful bilateral calcified ostial common iliac artery diamondback orbital rotational arthrectomy, PTA and stenting using ICast covered stents for lifestyle limiting claudication.   Treatments: See Hospital Course   Discharge Exam: Blood pressure 155/73, pulse 92, temperature 98 F (36.7 C), temperature source Oral, resp. rate 16, height _0  (1.626 m), weight 167 lb 15.9 oz (76.2 kg), SpO2 96.00%.   Disposition: 01-Home or Self Care      Discharge Orders   Future Appointments Provider Department Dept Phone   08/28/2013 10:00 AM Lorretta Harp, MD [facility] [phone]   11/23/2013 10:00 AM [facility] [place] [facility] [address] [phone]   11/23/2013 10:30 AM Serafina Mitchell, MD [facility] [phone]   Future Orders Complete By Expires   Diet - low sodium heart healthy  As directed    Discharge instructions  As directed    Driving Restrictions  As directed    Increase activity slowly  As directed    Lifting restrictions  As directed        Medication List    STOP taking these medications       omeprazole 20 MG capsule  Commonly known as:  PRILOSEC  Replaced by:  pantoprazole 40 MG tablet      TAKE these medications       amLODipine 5 MG tablet  Commonly known as:  NORVASC  Take 5 mg by mouth daily.     amoxicillin 875 MG  tablet  Commonly known as:  AMOXIL  Take 875 mg by mouth 2 (two) times daily as needed (for infection).     aspirin EC 81 MG tablet  Take 81 mg by mouth daily.     clopidogrel 75 MG tablet  Commonly known as:  PLAVIX  Take 1 tablet (75 mg total) by mouth daily with breakfast.     diazepam 5 MG tablet  Commonly known as:  VALIUM  Take 5 mg by  mouth every 12 (twelve) hours as needed for anxiety. Anxiety     fenofibrate micronized 134 MG capsule  Commonly known as:  LOFIBRA  Take 134 mg by mouth daily before breakfast.     fish oil-omega-3 fatty acids 1000 MG capsule  Take 1 g by mouth 2 (two) times daily.     hydrochlorothiazide 12.5 MG capsule  Commonly known as:  MICROZIDE  Take 12.5 mg by mouth daily.     HYDROcodone-acetaminophen 5-325 MG per tablet  Commonly known as:  NORCO/VICODIN  Take 1-2 tablets by mouth every 6 (six) hours as needed for moderate pain.     lisinopril 20 MG tablet  Commonly known as:  PRINIVIL,ZESTRIL  Take 20 mg by mouth daily.     metFORMIN 500 MG tablet  Commonly known as:  GLUCOPHAGE  Take 500 mg by mouth daily with breakfast.     metoCLOPramide 10 MG tablet  Commonly known as:  REGLAN  Take 10 mg by mouth 2 (two) times daily.     nebivolol 2.5 MG tablet  Commonly known as:  BYSTOLIC  Take 1 tablet (2.5 mg total) by mouth daily.     pantoprazole 40 MG tablet  Commonly known as:  PROTONIX  Take 1 tablet (40 mg total) by mouth daily.     pravastatin 40 MG tablet  Commonly known as:  PRAVACHOL  Take 40 mg by mouth daily.       Follow-up Information   Follow up with Lorretta Harp, MD On 08/28/2013. (10:00 am. )    Specialty:  Cardiology   Contact information:   [address] [place] [address] [phone]       Follow up with [place]. (our office will call you for an appointment for ultrasound of your legs. This will need to be done prior to your appoitment with Dr. Gwenlyn Found. )    Specialty:  Cardiology   Contact information:   [address] [place] [address] [place] [address] [phone]     [place], [place]: >30 MINUTES  Signed: Lyda Jester, PA-C 07/29/2013, 1:26 PM

## 2013-07-29 NOTE — Progress Notes (Signed)
   Subjective:  Feels better; pain improved  Objective:   Vital Signs in the last 24 hours: Temp:  [97.8 F (36.6 C)-98.8 F (37.1 C)] 98.8 F (37.1 C) (04/15 0807) Pulse Rate:  [71-102] 98 (04/15 0807) Resp:  [13-22] 16 (04/15 0807) BP: (95-198)/(33-87) 142/64 mmHg (04/15 0807) SpO2:  [91 %-97 %] 96 % (04/15 0807)  Intake/Output from previous day: 04/14 0701 - 04/15 0700 In: 900 [I.V.:900] Out: 2700 [Urine:2700]  Medications: . amLODipine  5 mg Oral Daily  . aspirin  81 mg Oral Daily  . clopidogrel  75 mg Oral Q breakfast  . hydrochlorothiazide  12.5 mg Oral Daily  . lisinopril  20 mg Oral Daily  . metoCLOPramide  10 mg Oral BID  . pantoprazole  40 mg Oral Daily    . sodium chloride 50 mL/hr at 07/29/13 0700  . nitroGLYCERIN Stopped (07/27/13 2230)    Physical Exam:   General appearance: alert, cooperative and no distress Neck: no adenopathy, no JVD, supple, symmetrical, trachea midline and thyroid not enlarged, symmetric, no tenderness/mass/nodules Lungs: decreased BS; no wheezing Heart: regular rate and rhythm and 1/6 sem Abdomen: soft, non-tender; bowel sounds normal; no masses,  no organomegaly Ecchymosis Left groin Extremities: no ulcers, gangrene or trophic changes Neurologic: Grossly normal   Rate: 95  Rhythm: normal sinus rhythm  Lab Results:    Recent Labs  07/28/13 0304 07/29/13 0233  NA 142 139  K 4.4 4.1  CL 103 101  CO2 26 27  GLUCOSE 129* 135*  BUN 24* 14  CREATININE 0.75 0.58   No results found for this basename: TROPONINI, CK, MB,  in the last 72 hours Hepatic Function Panel No results found for this basename: PROT, ALBUMIN, AST, ALT, ALKPHOS, BILITOT, BILIDIR, IBILI,  in the last 72 hours No results found for this basename: INR,  in the last 72 hours BNP (last 3 results) No results found for this basename: PROBNP,  in the last 8760 hours  Lipid Panel  No results found for this basename: chol, trig, hdl, cholhdl, vldl, ldlcalc        Imaging:  No results found.    Assessment/Plan:   Active Problems:   Claudication  Feels much better. BP labile this am; up to 198 earlier, now 155. Will add low dose bystolic. F/U Dr. Gwenlyn Found. DC today.    Troy Sine, MD, [facility] 07/29/2013, 11:59 AM

## 2013-08-19 ENCOUNTER — Ambulatory Visit (HOSPITAL_COMMUNITY)
Admission: RE | Admit: 2013-08-19 | Discharge: 2013-08-19 | Disposition: A | Discharge status: Home / Self Care | Payer: Medicare Other | Source: Ambulatory Visit | Attending: Cardiovascular Disease | Admitting: Cardiovascular Disease

## 2013-08-19 DIAGNOSIS — I70219 Atherosclerosis of native arteries of extremities with intermittent claudication, unspecified extremity: Secondary | ICD-10-CM | POA: Insufficient documentation

## 2013-08-19 DIAGNOSIS — I739 Peripheral vascular disease, unspecified: Secondary | ICD-10-CM

## 2013-08-19 NOTE — Progress Notes (Signed)
Lower Extremity Arterial Duplex Completed. °Brianna L Mazza,RVT °

## 2013-08-28 ENCOUNTER — Encounter: Payer: Self-pay | Admitting: Cardiovascular Disease

## 2013-08-28 ENCOUNTER — Ambulatory Visit (INDEPENDENT_AMBULATORY_CARE_PROVIDER_SITE_OTHER): Payer: Medicare Other | Admitting: Cardiovascular Disease

## 2013-08-28 VITALS — BP 177/77 | HR 64 | Ht 64.5 in | Wt 173.5 lb

## 2013-08-28 DIAGNOSIS — E785 Hyperlipidemia, unspecified: Secondary | ICD-10-CM

## 2013-08-28 DIAGNOSIS — I779 Disorder of arteries and arterioles, unspecified: Secondary | ICD-10-CM

## 2013-08-28 DIAGNOSIS — I739 Peripheral vascular disease, unspecified: Secondary | ICD-10-CM

## 2013-08-28 DIAGNOSIS — I10 Essential (primary) hypertension: Secondary | ICD-10-CM

## 2013-08-28 NOTE — Patient Instructions (Signed)
   RECOMMENDATIONS:  Please keep a record of your blood pressure readings daily.  You can purchase a blood pressure cuff at any pharmacy. Om[employer] is a good brand.  Bring the readings with you to your appointment with Erasmo Downer, our pharmacist, in one month.     Testing: Lower extremity arterial doppler in November- During this test, ultrasound is used to evaluate arterial blood flow in the legs. Allow approximately one hour for this exam.  Carotid Duplex in November- This test is an ultrasound of the carotid arteries in your neck. It looks at blood flow through these arteries that supply the brain with blood. Allow one hour for this exam. There are no restrictions or special instructions.    FOLLOW UP: Erasmo Downer in 1 month Your physician wants you to follow-up with him in : 6 months                                           and with an extender in : 3 months                    You will receive a reminder letter in the mail one month in advance. If you don't receive a letter, please call our office to schedule the follow-up appointment.

## 2013-08-28 NOTE — Progress Notes (Signed)
08/28/2013 Caitlin Oliver   1940/11/05  [id number]  Primary Physician Caitlin Matter, PA-C Primary Cardiologist: Caitlin Harp MD Caitlin Oliver    HPI:  Caitlin Oliver is a 73 year old mildly overweight widowed Caucasian female no children (son died 26 years ago) referred by Caitlin Niemann PA-C at [facility] for evaluation and treatment of lifestyle limiting claudication. Risk factors included hypertension, hyperlipidemia and type 2 diabetes. She has a 75-pack-year history of tobacco abuse smoking one and a half packs a day. There's no family history of heart disease. She's never had a heart for stroke. She does complain of dyspnea but denies chest pain. She complains of bilateral lower extremity claudication while walking up an incline which is lifestyle limiting. I age of Imdur 02/25/13 revealing high-grade bilateral internal carotid artery stenosis, high-grade bilateral calcified iliac artery stenosis as most common femoral artery stenoses. She had staged right followed by left carotid endarterectomy performed by Caitlin Oliver problem which she has recovered from. Because of lifestyle limiting claudication she underwent diamondback orbital rotational atherectomy, PT A. And stenting using ICast covered stents by myself on 03/28/14 which resulted in complete resolution of her claudication symptoms. Her Dopplers normalized as well though she did have a high-frequency signal in the right common femoral artery. Her major complaint today is of insomnia which she is convinced is related to a drug side effect.  Current Outpatient Prescriptions  Medication Sig Dispense Refill  . amLODipine (NORVASC) 5 MG tablet Take 5 mg by mouth daily.      [name] amoxicillin (AMOXIL) 875 MG tablet Take 875 mg by mouth 2 (two) times daily as needed (for infection).       [name] aspirin EC 81 MG tablet Take 81 mg by mouth daily.      . clopidogrel (PLAVIX) 75 MG tablet Take 1 tablet (75 mg  total) by mouth daily with breakfast.  30 tablet  5  . diazepam (VALIUM) 5 MG tablet Take 5 mg by mouth every 12 (twelve) hours as needed for anxiety. Anxiety      . fenofibrate micronized (LOFIBRA) 134 MG capsule Take 134 mg by mouth daily before breakfast.      . fish oil-omega-3 fatty acids 1000 MG capsule Take 1 g by mouth 2 (two) times daily.       . hydrochlorothiazide (MICROZIDE) 12.5 MG capsule Take 12.5 mg by mouth daily.      [name] HYDROcodone-acetaminophen (NORCO/VICODIN) 5-325 MG per tablet Take 1-2 tablets by mouth every 6 (six) hours as needed for moderate pain.  30 tablet  0  . lisinopril (PRINIVIL,ZESTRIL) 20 MG tablet Take 20 mg by mouth daily.      . metFORMIN (GLUCOPHAGE) 500 MG tablet Take 500 mg by mouth daily with breakfast.       . metoCLOPramide (REGLAN) 10 MG tablet Take 10 mg by mouth 2 (two) times daily.       . nebivolol (BYSTOLIC) 2.5 MG tablet Take 1 tablet (2.5 mg total) by mouth daily.  30 tablet  5  . pantoprazole (PROTONIX) 40 MG tablet Take 1 tablet (40 mg total) by mouth daily.  30 tablet  5  . pravastatin (PRAVACHOL) 40 MG tablet Take 40 mg by mouth daily.       No current facility-administered medications for this visit.    Allergies  Allergen Reactions  . Estrace [Estradiol] Swelling    Has allergy to patch, not the pill  . Oxycodone-Acetaminophen  Got the Shakes, mainly in her hands    History   Social History  . Marital Status: Widowed    Spouse Name: N/A    Number of Children: N/A  . Years of Education: N/A   Occupational History  . Not on file.   Social History Main Topics  . Smoking status: Former Smoker -- 1.50 packs/day for 56 years    Types: Cigarettes  . Smokeless tobacco: Former Systems developer    Quit date: 02/25/2013     Comment: Currently uses vapor (as of 07/07/2013)  . Alcohol Use: No  . Drug Use: No  . Sexual Activity: No   Other Topics Concern  . Not on file   Social History Narrative  . No narrative on file     Review of  Systems: General: negative for chills, fever, night sweats or weight changes.  Cardiovascular: negative for chest pain, dyspnea on exertion, edema, orthopnea, palpitations, paroxysmal nocturnal dyspnea or shortness of breath Dermatological: negative for rash Respiratory: negative for cough or wheezing Urologic: negative for hematuria Abdominal: negative for nausea, vomiting, diarrhea, bright red blood per rectum, melena, or hematemesis Neurologic: negative for visual changes, syncope, or dizziness All other systems reviewed and are otherwise negative except as noted above.    Blood pressure 177/77, pulse 64, height 5' 4.5" (1.638 m), weight 173 lb 8 oz (78.699 kg).  General appearance: alert and no distress Neck: no adenopathy, no JVD, supple, symmetrical, trachea midline, thyroid not enlarged, symmetric, no tenderness/mass/nodules and soft left carotid bruit Lungs: clear to auscultation bilaterally Heart: regular rate and rhythm, S1, S2 normal, no murmur, click, rub or gallop Abdomen: soft, non-tender; bowel sounds normal; no masses,  no organomegaly  EKG not performed today  ASSESSMENT AND PLAN:   Carotid artery disease Status post staged bilateral carotid endarterectomies performed by Caitlin Oliver  with recent carotid Dopplers performed last month that showed widely patent endarterectomy sites.  Claudication History of peripheral vascular disease with lifestyle limiting claudication. She had high-grade bilateral ostial calcified common iliac artery stenoses which I performed diamondback orbital rotational atherectomy, PTA and stenting using ICast  covered stents on 05/29/13 with an excellent angiographic, clinical and Doppler results. She no longer has claudication.  Hyperlipidemia On statin therapy followed by her PCP  Essential hypertension Premedications with mildly elevated blood pressure today which is unusual for her. I suggested that she obtain a digital blood pressure cuff  and record her blood pressures daily in a log. These will be reviewed by Caitlin Oliver, our Pharm.D. In one month with further recommendations depending on those results. She also complains of insomnia which he thinks is related to some of her medications.      Caitlin Harp MD FACP,FACC,FAHA, [facility] 08/28/2013 10:32 AM

## 2013-08-28 NOTE — Assessment & Plan Note (Signed)
On statin therapy followed by her PCP

## 2013-08-28 NOTE — Assessment & Plan Note (Signed)
History of peripheral vascular disease with lifestyle limiting claudication. She had high-grade bilateral ostial calcified common iliac artery stenoses which I performed diamondback orbital rotational atherectomy, PTA and stenting using ICast  covered stents on 05/29/13 with an excellent angiographic, clinical and Doppler results. She no longer has claudication.

## 2013-08-28 NOTE — Assessment & Plan Note (Signed)
Premedications with mildly elevated blood pressure today which is unusual for her. I suggested that she obtain a digital blood pressure cuff and record her blood pressures daily in a log. These will be reviewed by Cyril Mourning, our Pharm.D. In one month with further recommendations depending on those results. She also complains of insomnia which he thinks is related to some of her medications.

## 2013-08-28 NOTE — Assessment & Plan Note (Signed)
Status post staged bilateral carotid endarterectomies performed by Dr.Brabham  with recent carotid Dopplers performed last month that showed widely patent endarterectomy sites.

## 2013-09-28 ENCOUNTER — Ambulatory Visit (INDEPENDENT_AMBULATORY_CARE_PROVIDER_SITE_OTHER): Payer: Medicare Other | Admitting: Pharmacist Clinician (PhC)/ Clinical Pharmacy Specialist

## 2013-09-28 VITALS — BP 112/78 | HR 64 | Ht 64.5 in | Wt 175.0 lb

## 2013-09-28 DIAGNOSIS — I10 Essential (primary) hypertension: Secondary | ICD-10-CM

## 2013-09-28 NOTE — Patient Instructions (Signed)
Return for a a follow up appointment in 2 months  Your blood pressure today is 112/78 HR 64  Check your blood pressure at home daily  and keep record of the readings.  Continue with all meds, with the following exception:  Cut amlodipine tablet in 1/2 from 18m to 2.533m Bring all of your meds, your BP cuff and your record of home blood pressures to your next appointment.  Exercise as you're able, try to walk approximately 30 minutes per day.  Keep salt intake to a minimum, especially watch canned and prepared boxed foods.  Eat more fresh fruits and vegetables and fewer canned items.  Avoid eating in fast food restaurants.    HOW TO TAKE YOUR BLOOD PRESSURE:   Rest 5 minutes before taking your blood pressure.    Don't smoke or drink caffeinated beverages for at least 30 minutes before.   Take your blood pressure before (not after) you eat.   Sit comfortably with your back supported and both feet on the floor (don't cross your legs).   Elevate your arm to heart level on a table or a desk.   Use the proper sized cuff. It should fit smoothly and snugly around your bare upper arm. There should be enough room to slip a fingertip under the cuff. The bottom edge of the cuff should be 1 inch above the crease of the elbow.   Ideally, take 3 measurements at one sitting and record the average.

## 2013-09-30 ENCOUNTER — Encounter: Payer: Self-pay | Admitting: Pharmacist Clinician (PhC)/ Clinical Pharmacy Specialist

## 2013-09-30 MED ORDER — AMLODIPINE BESYLATE 2.5 MG PO TABS: 2.5000 mg | ORAL_TABLET | Freq: Every day | ORAL | Status: DC

## 2013-09-30 NOTE — Progress Notes (Signed)
09/30/2013 Caitlin Oliver Nov 30, 1940 [id number]   HPI:  Caitlin Oliver is a 73 y.o. female patient of Dr Gwenlyn Found, with a PMH below who presents today for a blood pressure check.  When she last saw Dr. Gwenlyn Found her BP was elevated at 177/77.  Most of her previous recordings in Epic show pressure to normally be WNL.  She takes amlodipine 41m, metoprolol 12.512m hctz 12.81m6mnd lisinopril 13m23mily. She had previously been on Bystolic, but was switched to metoprolol by her PCP, I believe due to cost issues.  Pt states compliance with medications.  [name] former smoker, 1-1.5 ppd, having quit in November of 2014.  She doesn't drink alcohol and avoids caffeinated products.  She states some problems with lower extremity edema, but otherwise has no complaints.  She did take all of her medications this morning.  She did not bring her blood pressure cuff with her today, but she states most of the home readings have been 100-132/60-80.  She did have one low systolic reading at 84.    Current Outpatient Prescriptions  Medication Sig Dispense Refill  . amLODipine (NORVASC) 5 MG tablet Take 5 mg by mouth daily.      . amMarland Kitchenxicillin (AMOXIL) 875 MG tablet Take 875 mg by mouth 2 (two) times daily as needed (for infection).       . asMarland Kitchenirin EC 81 MG tablet Take 81 mg by mouth daily.      . clopidogrel (PLAVIX) 75 MG tablet Take 1 tablet (75 mg total) by mouth daily with breakfast.  30 tablet  5  . diazepam (VALIUM) 5 MG tablet Take 5 mg by mouth every 12 (twelve) hours as needed for anxiety. Anxiety      . fenofibrate micronized (LOFIBRA) 134 MG capsule Take 134 mg by mouth daily before breakfast.      . fish oil-omega-3 fatty acids 1000 MG capsule Take 1 g by mouth 2 (two) times daily.       . hydrochlorothiazide (MICROZIDE) 12.5 MG capsule Take 12.5 mg by mouth daily.      . HYMarland KitchenROcodone-acetaminophen (NORCO/VICODIN) 5-325 MG per tablet Take 1-2 tablets by mouth every 6 (six) hours as needed for moderate  pain.  30 tablet  0  . lisinopril (PRINIVIL,ZESTRIL) 20 MG tablet Take 20 mg by mouth daily.      . metFORMIN (GLUCOPHAGE) 500 MG tablet Take 500 mg by mouth daily with breakfast.       . metoCLOPramide (REGLAN) 10 MG tablet Take 10 mg by mouth 2 (two) times daily.       . nebivolol (BYSTOLIC) 2.5 MG tablet Take 1 tablet (2.5 mg total) by mouth daily.  30 tablet  5  . pantoprazole (PROTONIX) 40 MG tablet Take 1 tablet (40 mg total) by mouth daily.  30 tablet  5  . pravastatin (PRAVACHOL) 40 MG tablet Take 40 mg by mouth daily.       No current facility-administered medications for this visit.    Allergies  Allergen Reactions  . Estrace [Estradiol] Swelling    Has allergy to patch, not the pill  . Oxycodone-Acetaminophen     Got the Shakes, mainly in her hands    Past Medical History  Diagnosis Date  . Hypertension   . Hypercholesteremia   . Diabetes mellitus   . Anxiety   . Stomach ulcer   . Claudication   . Tobacco abuse   . Carotid artery disease   .  Peripheral arterial disease     bilateral iliac artery stenosis by angiography  . COPD (chronic obstructive pulmonary disease)   . Shortness of breath     with exertion  . GERD (gastroesophageal reflux disease)     Blood pressure 112/78, pulse 64, height 5' 4.5" (1.638 m), weight 175 lb (79.379 kg). Standing 120/84   ASSESSMENT AND PLAN:  [name] is diabetic and therefore her BP goal is to be <140/90.  She is clearly well controlled with her BP.  Because of the lower extremity edema, I will decrease her amlodipine from 85m to 2.525monce daily.  I have asked her to continue with home BP monitoring and she will return in 2 months for a follow up appointment.  She is to bring her cuff with her at that time for calibration.  KrTommy MedalharmD CPP [facility]

## 2013-10-05 ENCOUNTER — Telehealth: Payer: Self-pay | Admitting: Family Medicine

## 2013-10-05 NOTE — Telephone Encounter (Signed)
Initially received call from [facility]) above patient presented with what sounds like a concomitant COPD and new onset CHF exacerbation. Patient with increased work of breathing, cough with productive sputum. Noted pro BNP of 5000 as well as trace edema on exam per ER physician.? Infiltrate on imaging. Patient also with an initial troponin of 0.5. ER physician denies any chest pain currently and no significant EKG changes. Patient does have a baseline history of coronary artery disease, as well as hypertension, type 2 diabetes, tobacco abuse, peripheral vascular disease. They currently do not have cardiology available. Patient's cardiologist is Dr. Gwenlyn Found. Discussed with ER physician that I would like to get clearance from cardiology here at home before patient will be accepted. He will discuss case with cardiology via [place]. If cardiology preliminarily clears patient, that we can accept patient. Received the patient did not have any step down beds available here at [facility]. I relayed this to [facility]. Follow up. ER physician discussed case with cards. They will consult.  Pt still tachycardic, with increased WOB though improved, O2 sats still in low 90s w/ 2L [place]. Discussed that given overall clinical picture she will need stepdown bed. No stepdown beds available at [facility]. ER physician states that he will try another hospital with SDU availability.

## 2013-10-08 DIAGNOSIS — J40 Bronchitis, not specified as acute or chronic: Secondary | ICD-10-CM | POA: Insufficient documentation

## 2013-10-10 DIAGNOSIS — R0902 Hypoxemia: Secondary | ICD-10-CM | POA: Insufficient documentation

## 2013-10-18 ENCOUNTER — Emergency Department (HOSPITAL_COMMUNITY): Payer: Medicare Other

## 2013-10-18 ENCOUNTER — Inpatient Hospital Stay (HOSPITAL_COMMUNITY)
Admission: EM | Admit: 2013-10-18 | Discharge: 2013-10-23 | DRG: 371 | Disposition: A | Discharge status: Home Health | Payer: Medicare Other | Attending: Internal Medicine | Admitting: Internal Medicine

## 2013-10-18 ENCOUNTER — Encounter (HOSPITAL_COMMUNITY): Payer: Self-pay | Admitting: Emergency Medicine

## 2013-10-18 DIAGNOSIS — I498 Other specified cardiac arrhythmias: Secondary | ICD-10-CM | POA: Diagnosis present

## 2013-10-18 DIAGNOSIS — Z66 Do not resuscitate: Secondary | ICD-10-CM | POA: Diagnosis present

## 2013-10-18 DIAGNOSIS — E785 Hyperlipidemia, unspecified: Secondary | ICD-10-CM

## 2013-10-18 DIAGNOSIS — Z720 Tobacco use: Secondary | ICD-10-CM

## 2013-10-18 DIAGNOSIS — J411 Mucopurulent chronic bronchitis: Secondary | ICD-10-CM

## 2013-10-18 DIAGNOSIS — E7800 Pure hypercholesterolemia, unspecified: Secondary | ICD-10-CM | POA: Diagnosis present

## 2013-10-18 DIAGNOSIS — K921 Melena: Secondary | ICD-10-CM

## 2013-10-18 DIAGNOSIS — R062 Wheezing: Secondary | ICD-10-CM | POA: Diagnosis present

## 2013-10-18 DIAGNOSIS — R531 Weakness: Secondary | ICD-10-CM

## 2013-10-18 DIAGNOSIS — Z7902 Long term (current) use of antithrombotics/antiplatelets: Secondary | ICD-10-CM

## 2013-10-18 DIAGNOSIS — R031 Nonspecific low blood-pressure reading: Secondary | ICD-10-CM | POA: Diagnosis present

## 2013-10-18 DIAGNOSIS — K922 Gastrointestinal hemorrhage, unspecified: Secondary | ICD-10-CM | POA: Insufficient documentation

## 2013-10-18 DIAGNOSIS — Z7982 Long term (current) use of aspirin: Secondary | ICD-10-CM

## 2013-10-18 DIAGNOSIS — D500 Iron deficiency anemia secondary to blood loss (chronic): Secondary | ICD-10-CM | POA: Diagnosis present

## 2013-10-18 DIAGNOSIS — K259 Gastric ulcer, unspecified as acute or chronic, without hemorrhage or perforation: Secondary | ICD-10-CM | POA: Diagnosis present

## 2013-10-18 DIAGNOSIS — E119 Type 2 diabetes mellitus without complications: Secondary | ICD-10-CM

## 2013-10-18 DIAGNOSIS — D72829 Elevated white blood cell count, unspecified: Secondary | ICD-10-CM | POA: Diagnosis present

## 2013-10-18 DIAGNOSIS — K2991 Gastroduodenitis, unspecified, with bleeding: Secondary | ICD-10-CM

## 2013-10-18 DIAGNOSIS — D62 Acute posthemorrhagic anemia: Secondary | ICD-10-CM

## 2013-10-18 DIAGNOSIS — K2961 Other gastritis with bleeding: Secondary | ICD-10-CM

## 2013-10-18 DIAGNOSIS — I739 Peripheral vascular disease, unspecified: Secondary | ICD-10-CM

## 2013-10-18 DIAGNOSIS — R578 Other shock: Secondary | ICD-10-CM | POA: Diagnosis present

## 2013-10-18 DIAGNOSIS — I10 Essential (primary) hypertension: Secondary | ICD-10-CM

## 2013-10-18 DIAGNOSIS — K2990 Gastroduodenitis, unspecified, without bleeding: Secondary | ICD-10-CM

## 2013-10-18 DIAGNOSIS — I2510 Atherosclerotic heart disease of native coronary artery without angina pectoris: Secondary | ICD-10-CM | POA: Diagnosis present

## 2013-10-18 DIAGNOSIS — E872 Acidosis, unspecified: Secondary | ICD-10-CM

## 2013-10-18 DIAGNOSIS — F17200 Nicotine dependence, unspecified, uncomplicated: Secondary | ICD-10-CM | POA: Diagnosis present

## 2013-10-18 DIAGNOSIS — L719 Rosacea, unspecified: Secondary | ICD-10-CM | POA: Diagnosis present

## 2013-10-18 DIAGNOSIS — A0472 Enterocolitis due to Clostridium difficile, not specified as recurrent: Principal | ICD-10-CM

## 2013-10-18 DIAGNOSIS — Z888 Allergy status to other drugs, medicaments and biological substances status: Secondary | ICD-10-CM

## 2013-10-18 DIAGNOSIS — R109 Unspecified abdominal pain: Secondary | ICD-10-CM

## 2013-10-18 DIAGNOSIS — F411 Generalized anxiety disorder: Secondary | ICD-10-CM | POA: Diagnosis present

## 2013-10-18 DIAGNOSIS — I959 Hypotension, unspecified: Secondary | ICD-10-CM

## 2013-10-18 DIAGNOSIS — R52 Pain, unspecified: Secondary | ICD-10-CM

## 2013-10-18 DIAGNOSIS — K2971 Gastritis, unspecified, with bleeding: Secondary | ICD-10-CM

## 2013-10-18 DIAGNOSIS — E1159 Type 2 diabetes mellitus with other circulatory complications: Secondary | ICD-10-CM | POA: Diagnosis present

## 2013-10-18 DIAGNOSIS — K2970 Gastritis, unspecified, without bleeding: Secondary | ICD-10-CM | POA: Diagnosis present

## 2013-10-18 DIAGNOSIS — K219 Gastro-esophageal reflux disease without esophagitis: Secondary | ICD-10-CM | POA: Diagnosis present

## 2013-10-18 DIAGNOSIS — Z79899 Other long term (current) drug therapy: Secondary | ICD-10-CM

## 2013-10-18 DIAGNOSIS — J441 Chronic obstructive pulmonary disease with (acute) exacerbation: Secondary | ICD-10-CM

## 2013-10-18 DIAGNOSIS — I6529 Occlusion and stenosis of unspecified carotid artery: Secondary | ICD-10-CM

## 2013-10-18 LAB — CBC WITH DIFFERENTIAL/PLATELET
BASOS PCT: 0 % (ref 0–1)
Basophils Absolute: 0 10*3/uL (ref 0.0–0.1)
EOS ABS: 0 10*3/uL (ref 0.0–0.7)
Eosinophils Relative: 0 % (ref 0–5)
HEMATOCRIT: 24.9 % — AB (ref 36.0–46.0)
Hemoglobin: 8.3 g/dL — ABNORMAL LOW (ref 12.0–15.0)
Lymphocytes Relative: 7 % — ABNORMAL LOW (ref 12–46)
Lymphs Abs: 1.4 10*3/uL (ref 0.7–4.0)
MCH: 31.4 pg (ref 26.0–34.0)
MCHC: 33.3 g/dL (ref 30.0–36.0)
MCV: 94.3 fL (ref 78.0–100.0)
MONO ABS: 0.5 10*3/uL (ref 0.1–1.0)
MONOS PCT: 3 % (ref 3–12)
Neutro Abs: 17.1 10*3/uL — ABNORMAL HIGH (ref 1.7–7.7)
Neutrophils Relative %: 90 % — ABNORMAL HIGH (ref 43–77)
Platelets: 511 10*3/uL — ABNORMAL HIGH (ref 150–400)
RBC: 2.64 MIL/uL — ABNORMAL LOW (ref 3.87–5.11)
RDW: 14.2 % (ref 11.5–15.5)
WBC: 19 10*3/uL — ABNORMAL HIGH (ref 4.0–10.5)

## 2013-10-18 LAB — COMPREHENSIVE METABOLIC PANEL
ALT: 15 U/L (ref 0–35)
ANION GAP: 14 (ref 5–15)
AST: 13 U/L (ref 0–37)
Albumin: 2.5 g/dL — ABNORMAL LOW (ref 3.5–5.2)
Alkaline Phosphatase: 32 U/L — ABNORMAL LOW (ref 39–117)
BUN: 50 mg/dL — AB (ref 6–23)
CALCIUM: 7.7 mg/dL — AB (ref 8.4–10.5)
CO2: 20 mEq/L (ref 19–32)
CREATININE: 0.51 mg/dL (ref 0.50–1.10)
Chloride: 103 mEq/L (ref 96–112)
GFR calc Af Amer: >90 mL/min (ref >90)
GFR calc non Af Amer: >90 mL/min (ref >90)
Glucose, Bld: 162 mg/dL — ABNORMAL HIGH (ref 70–99)
Potassium: 4.6 mEq/L (ref 3.7–5.3)
Sodium: 137 mEq/L (ref 137–147)
TOTAL PROTEIN: 4.7 g/dL — AB (ref 6.0–8.3)
Total Bilirubin: <0.2 mg/dL — ABNORMAL LOW (ref 0.3–1.2)

## 2013-10-18 LAB — PREPARE RBC (CROSSMATCH)

## 2013-10-18 LAB — CBC
HEMATOCRIT: 26.1 % — AB (ref 36.0–46.0)
Hemoglobin: 8.7 g/dL — ABNORMAL LOW (ref 12.0–15.0)
MCH: 30.5 pg (ref 26.0–34.0)
MCHC: 33.3 g/dL (ref 30.0–36.0)
MCV: 91.6 fL (ref 78.0–100.0)
PLATELETS: 479 10*3/uL — AB (ref 150–400)
RBC: 2.85 MIL/uL — ABNORMAL LOW (ref 3.87–5.11)
RDW: 15.1 % (ref 11.5–15.5)
WBC: 14 10*3/uL — AB (ref 4.0–10.5)

## 2013-10-18 LAB — I-STAT CHEM 8, ED
BUN: 47 mg/dL — ABNORMAL HIGH (ref 6–23)
Calcium, Ion: 1.13 mmol/L (ref 1.13–1.30)
Chloride: 106 mEq/L (ref 96–112)
Creatinine, Ser: 0.5 mg/dL (ref 0.50–1.10)
GLUCOSE: 157 mg/dL — AB (ref 70–99)
HCT: 28 % — ABNORMAL LOW (ref 36.0–46.0)
HEMOGLOBIN: 9.5 g/dL — AB (ref 12.0–15.0)
Potassium: 4.6 mEq/L (ref 3.7–5.3)
Sodium: 136 mEq/L — ABNORMAL LOW (ref 137–147)
TCO2: 21 mmol/L (ref 0–100)

## 2013-10-18 LAB — I-STAT CG4 LACTIC ACID, ED: LACTIC ACID, VENOUS: 2.66 mmol/L — AB (ref 0.5–2.2)

## 2013-10-18 LAB — MAGNESIUM: Magnesium: 1.6 mg/dL (ref 1.5–2.5)

## 2013-10-18 LAB — MRSA PCR SCREENING: MRSA by PCR: NEGATIVE

## 2013-10-18 LAB — PROTIME-INR
INR: 1.17 (ref 0.00–1.49)
PROTHROMBIN TIME: 14.9 s (ref 11.6–15.2)

## 2013-10-18 LAB — POC OCCULT BLOOD, ED: FECAL OCCULT BLD: POSITIVE — AB

## 2013-10-18 LAB — LACTIC ACID, PLASMA: Lactic Acid, Venous: 4 mmol/L — ABNORMAL HIGH (ref 0.5–2.2)

## 2013-10-18 LAB — GLUCOSE, CAPILLARY: Glucose-Capillary: 190 mg/dL — ABNORMAL HIGH (ref 70–99)

## 2013-10-18 MED ORDER — SODIUM CHLORIDE 0.9 % IV SOLN: 250.0000 mL | INTRAVENOUS | Status: DC | PRN

## 2013-10-18 MED ORDER — IOHEXOL 350 MG/ML SOLN
100.0000 mL | Freq: Once | INTRAVENOUS | Status: AC | PRN
  Administered 2013-10-18: 100 mL via INTRAVENOUS

## 2013-10-18 MED ORDER — SODIUM CHLORIDE 0.9 % IV SOLN
1000.0000 mL | INTRAVENOUS | Status: DC
  Administered 2013-10-18 – 2013-10-21 (×3): 1000 mL via INTRAVENOUS

## 2013-10-18 MED ORDER — INSULIN ASPART 100 UNIT/ML ~~LOC~~ SOLN
2.0000 [IU] | SUBCUTANEOUS | Status: DC
  Administered 2013-10-18 (×2): 4 [IU] via SUBCUTANEOUS

## 2013-10-18 MED ORDER — SODIUM CHLORIDE 0.9 % IV SOLN
1000.0000 mL | Freq: Once | INTRAVENOUS | Status: AC
  Administered 2013-10-18: 1000 mL via INTRAVENOUS

## 2013-10-18 MED ORDER — ONDANSETRON HCL 4 MG/2ML IJ SOLN
4.0000 mg | Freq: Four times a day (QID) | INTRAMUSCULAR | Status: DC | PRN
  Administered 2013-10-18 – 2013-10-22 (×8): 4 mg via INTRAVENOUS
  Filled 2013-10-18 (×8): qty 2

## 2013-10-18 MED ORDER — DIAZEPAM 5 MG PO TABS
5.0000 mg | ORAL_TABLET | Freq: Two times a day (BID) | ORAL | Status: DC | PRN
  Administered 2013-10-20 – 2013-10-23 (×5): 5 mg via ORAL
  Filled 2013-10-18 (×5): qty 1

## 2013-10-18 MED ORDER — FENTANYL CITRATE 0.05 MG/ML IJ SOLN
50.0000 ug | INTRAMUSCULAR | Status: DC | PRN
  Administered 2013-10-18 – 2013-10-19 (×4): 50 ug via INTRAVENOUS
  Filled 2013-10-18 (×5): qty 2

## 2013-10-18 MED ORDER — SODIUM CHLORIDE 0.9 % IV SOLN
INTRAVENOUS | Status: DC
  Administered 2013-10-18: 125 mL/h via INTRAVENOUS
  Administered 2013-10-20: 04:00:00 via INTRAVENOUS

## 2013-10-18 MED ORDER — SODIUM CHLORIDE 0.9 % IV BOLUS (SEPSIS)
500.0000 mL | Freq: Once | INTRAVENOUS | Status: AC
  Administered 2013-10-18: 500 mL via INTRAVENOUS

## 2013-10-18 MED ORDER — ALBUTEROL SULFATE (2.5 MG/3ML) 0.083% IN NEBU: 2.5000 mg | INHALATION_SOLUTION | RESPIRATORY_TRACT | Status: DC | PRN

## 2013-10-18 MED ORDER — SODIUM CHLORIDE 0.9 % IV SOLN
80.0000 mg | Freq: Once | INTRAVENOUS | Status: AC
  Administered 2013-10-18: 80 mg via INTRAVENOUS
  Filled 2013-10-18: qty 80

## 2013-10-18 MED ORDER — ONDANSETRON HCL 4 MG/2ML IJ SOLN
4.0000 mg | Freq: Once | INTRAMUSCULAR | Status: AC
  Administered 2013-10-18: 4 mg via INTRAVENOUS
  Filled 2013-10-18: qty 2

## 2013-10-18 MED ORDER — PIPERACILLIN-TAZOBACTAM 3.375 G IVPB 30 MIN
3.3750 g | Freq: Once | INTRAVENOUS | Status: AC
  Administered 2013-10-18: 3.375 g via INTRAVENOUS
  Filled 2013-10-18: qty 50

## 2013-10-18 MED ORDER — PANTOPRAZOLE SODIUM 40 MG IV SOLR
8.0000 mg/h | INTRAVENOUS | Status: DC
  Administered 2013-10-18 – 2013-10-20 (×4): 8 mg/h via INTRAVENOUS
  Filled 2013-10-18 (×9): qty 80

## 2013-10-18 MED ORDER — SODIUM CHLORIDE 0.9 % IV SOLN
1.0000 g | Freq: Once | INTRAVENOUS | Status: AC
  Administered 2013-10-18: 1 g via INTRAVENOUS
  Filled 2013-10-18: qty 10

## 2013-10-18 MED ORDER — MOMETASONE FURO-FORMOTEROL FUM 100-5 MCG/ACT IN AERO
2.0000 | INHALATION_SPRAY | Freq: Two times a day (BID) | RESPIRATORY_TRACT | Status: DC
  Administered 2013-10-19 – 2013-10-23 (×9): 2 via RESPIRATORY_TRACT
  Filled 2013-10-18: qty 8.8

## 2013-10-18 MED ORDER — ACETAMINOPHEN 10 MG/ML IV SOLN
1000.0000 mg | Freq: Once | INTRAVENOUS | Status: AC
  Administered 2013-10-18: 1000 mg via INTRAVENOUS
  Filled 2013-10-18: qty 100

## 2013-10-18 NOTE — ED Notes (Signed)
Per EMS- Pt comes from home was recently discharged from [facility] for pneumonia. Has been feeling good all week, this morning at 0600 had bloody stools. EMS arrived bp 50 systolic, pale, diaphoretic, wheezing, labored breathing. Given total 15 mg albuterol, 0.5 atrovent, 125 solumedrol, 4 mg zofran. 1300 NS BP 90/60. CBG 117

## 2013-10-18 NOTE — ED Notes (Signed)
Critical Care MD at bedside.

## 2013-10-18 NOTE — H&P (Signed)
PULMONARY / CRITICAL CARE MEDICINE   Name: Caitlin Oliver MRN: [id number] DOB: December 01, 1940    ADMISSION DATE:  10/18/2013 CONSULTATION DATE:  7/5  REFERRING MD :  Reather Converse PRIMARY SERVICE: PCCM  CHIEF COMPLAINT:  Weakness, diarrhea  BRIEF PATIENT DESCRIPTION: 73 y/o female with COPD and DM2 admitted on 7/5 with a presumed upper GI bleed.  SIGNIFICANT EVENTS / STUDIES:  7/5 CTA ab/pelv >>  LINES / TUBES:   CULTURES: 7/5 c.diff >>  ANTIBIOTICS: 7/5 zosyn ER x1  HISTORY OF PRESENT ILLNESS:  73 y/o female with COPD and DM2 admitted on 7/5 with a presumed upper GI bleed. She has no prior history of a GI bleed, though she says her PCP "thought [she] had it once but it turned out [she] did not".  She was recently admitted to [facility] for an acute respiratory problem and possibly lower lobe pneumonia and was discharged home on 6/27. While in the hospital she said that she developed abdominal pain, some mild nausea, and black frequent stools.  Her breathing has actually improved since her discharge, but her GI symptoms have persisted and now she is profoundly weak.  She came into the ED on 7/5 due to weakness, dizziness, and inability to walk.  She noted that she had several black bloody bowel movements per day which were not formed and were small volume.  She has not had vomiting. Eating actually makes the abdominal pain go away for a few minutes but then it returns and remains present continuously.    She doesn't recall much of her history from [facility].  Review of her discharge summary from there states that she was admitted from 6/23 to 6/27 for a COPD exacerbation.  She had an echo which showed an LVEF 60-65%, normal LV, and mildly elevated RVSP at 69mHg.  A CTA of the chest was negative for PE, but had tree and bud abnormalities in the RLL.  She was discharged home on prednisone and levaquin in addition to her other medicines.  She has been taking plavix regularly since her iliac stents were  placed in April of 2015 (she thinks).    PAST MEDICAL HISTORY :  Past Medical History  Diagnosis Date  . Hypertension   . Hypercholesteremia   . Diabetes mellitus   . Anxiety   . Stomach ulcer   . Claudication   . Tobacco abuse   . Carotid artery disease   . Peripheral arterial disease     bilateral iliac artery stenosis by angiography  . COPD (chronic obstructive pulmonary disease)   . Shortness of breath     with exertion  . GERD (gastroesophageal reflux disease)    Past Surgical History  Procedure Laterality Date  . Tubal ligation    . Abdominal hysterectomy    . Breast reduction surgery    . Appendectomy    . Salivary gland surgery      scar tissue removed from left saliva glad  . Endarterectomy Right 03/06/2013    Procedure: ENDARTERECTOMY CAROTID-RIGHT;  Surgeon: VSerafina Mitchell MD;  [place]  Service: Vascular;  Laterality: Right;  . Patch angioplasty Right 03/06/2013    Procedure: PATCH ANGIOPLASTY of Right Carotid Artery using Vascu-Guard Patch;  Surgeon: VSerafina Mitchell MD;  [place]  Service: Vascular;  Laterality: Right;  . Endarterectomy Left 05/07/2013    Procedure: LEFT CAROTID ARTERY ENDARTERECTOMY WITH VASCU-GUARD PATCH ANGIOPLASTY ;  Surgeon: VSerafina Mitchell MD;  [place] [facility]  Service: Vascular;  Laterality: Left;   Prior to Admission medications   Medication Sig Start Date End Date Taking? Authorizing Provider  diltiazem (CARDIZEM CD) 300 MG 24 hr capsule Take 300 mg by mouth daily. 10/11/13 10/11/14 Yes Historical Provider, MD  potassium chloride SA (K-DUR,KLOR-CON) 20 MEQ tablet Take 40 mEq by mouth daily. 10/10/13 10/10/14 Yes Historical Provider, MD  amLODipine (NORVASC) 2.5 MG tablet Take 1 tablet (2.5 mg total) by mouth daily. 09/30/13   Lorretta Harp, MD  amoxicillin (AMOXIL) 875 MG tablet Take 875 mg by mouth 2 (two) times daily as needed (for infection).  04/27/13   Historical Provider, MD  aspirin EC 81 MG tablet Take 81 mg by  mouth daily.    Historical Provider, MD  clopidogrel (PLAVIX) 75 MG tablet Take 1 tablet (75 mg total) by mouth daily with breakfast. 07/29/13   Brittainy Simmons, PA-C  diazepam (VALIUM) 5 MG tablet Take 5 mg by mouth every 12 (twelve) hours as needed for anxiety. Anxiety    Historical Provider, MD  fenofibrate micronized (LOFIBRA) 134 MG capsule Take 134 mg by mouth daily before breakfast.    Historical Provider, MD  fish oil-omega-3 fatty acids 1000 MG capsule Take 1 g by mouth 2 (two) times daily.     Historical Provider, MD  hydrochlorothiazide (MICROZIDE) 12.5 MG capsule Take 12.5 mg by mouth daily.    Historical Provider, MD  HYDROcodone-acetaminophen (NORCO/VICODIN) 5-325 MG per tablet Take 1-2 tablets by mouth every 6 (six) hours as needed for moderate pain. 05/08/13   Ulyses Amor, PA-C  lisinopril (PRINIVIL,ZESTRIL) 20 MG tablet Take 20 mg by mouth daily.    Historical Provider, MD  metFORMIN (GLUCOPHAGE) 500 MG tablet Take 500 mg by mouth daily with breakfast.     Historical Provider, MD  metoCLOPramide (REGLAN) 10 MG tablet Take 10 mg by mouth 2 (two) times daily.     Historical Provider, MD  metoprolol succinate (TOPROL-XL) 25 MG 24 hr tablet Take 12.5 mg by mouth daily.    Historical Provider, MD  omeprazole (PRILOSEC) 20 MG capsule Take 20 mg by mouth 2 (two) times daily.    Historical Provider, MD  pantoprazole (PROTONIX) 40 MG tablet Take 1 tablet (40 mg total) by mouth daily. 07/29/13   Brittainy Simmons, PA-C  pravastatin (PRAVACHOL) 40 MG tablet Take 40 mg by mouth daily.    Historical Provider, MD   Allergies  Allergen Reactions  . Estrace [Estradiol] Swelling    Has allergy to patch, not the pill  . Oxycodone-Acetaminophen     Got the Shakes, mainly in her hands    FAMILY HISTORY:  Family History  Problem Relation Age of Onset  . Stroke Mother   . Hypertension Mother   . Kidney failure Father    SOCIAL HISTORY:  reports that she has quit smoking. Her smoking use  included Cigarettes. She has a 84 pack-year smoking history. She quit smokeless tobacco use about 7 months ago. She reports that she does not drink alcohol or use illicit drugs.  REVIEW OF SYSTEMS:   Gen: Denies fever, chills, weight change, fatigue, night sweats HEENT: Denies blurred vision, double vision, hearing loss, tinnitus, sinus congestion, rhinorrhea, sore throat, neck stiffness, dysphagia PULM: Denies shortness of breath, cough, sputum production, hemoptysis, wheezing CV: Denies chest pain, edema, orthopnea, paroxysmal nocturnal dyspnea, palpitations GI: per HPI GU: Denies dysuria, hematuria, polyuria, oliguria, urethral discharge Endocrine: Denies hot or cold intolerance, polyuria, polyphagia or appetite change Derm: Denies rash, dry skin, scaling or  peeling skin change Heme: Denies easy bruising, bleeding, bleeding gums Neuro: Denies headache, numbness, weakness, slurred speech, loss of memory or consciousness   SUBJECTIVE:   VITAL SIGNS: Temp:  [97 F (36.1 C)-98 F (36.7 C)] 98 F (36.7 C) (07/05 1616) Pulse Rate:  [113-116] 116 (07/05 1600) Resp:  [19-28] 28 (07/05 1616) BP: (103-126)/(43-50) 103/45 mmHg (07/05 1616) SpO2:  [96 %-100 %] 98 % (07/05 1616) Weight:  [78.926 kg (174 lb)] 78.926 kg (174 lb) (07/05 1445) HEMODYNAMICS:   VENTILATOR SETTINGS:   INTAKE / OUTPUT: Intake/Output   None     PHYSICAL EXAMINATION: General:  Mildly ill appearing Neuro:  A&OX4, maew HEENT:  NCAT, EOMi, PERRL Cardiovascular:  Tachy, regular, no mgr Lungs:  Mild wheeze bilaterally, good air movement Abdomen:  BS+, mild diffuse tenderness but no guarding or rebound Musculoskeletal:  Normal bulk and tone Skin:  Rosacea face, some bruising forearms from old IVs, otherwise normal  LABS:  CBC  Recent Labs Lab 10/18/13 1455 10/18/13 1509  WBC 19.0*  --   HGB 8.3* 9.5*  HCT 24.9* 28.0*  PLT 511*  --    Coag's  Recent Labs Lab 10/18/13 1455  INR 1.17    BMET  Recent Labs Lab 10/18/13 1455 10/18/13 1509  NA 137 136*  K 4.6 4.6  CL 103 106  CO2 20  --   BUN 50* 47*  CREATININE 0.51 0.50  GLUCOSE 162* 157*   Electrolytes  Recent Labs Lab 10/18/13 1455  CALCIUM 7.7*   Sepsis Markers  Recent Labs Lab 10/18/13 1508  LATICACIDVEN 2.66*   ABG No results found for this basename: PHART, PCO2ART, PO2ART,  in the last 168 hours Liver Enzymes  Recent Labs Lab 10/18/13 1455  AST 13  ALT 15  ALKPHOS 32*  BILITOT <0.2*  ALBUMIN 2.5*   Cardiac Enzymes No results found for this basename: TROPONINI, PROBNP,  in the last 168 hours Glucose No results found for this basename: GLUCAP,  in the last 168 hours  Imaging  7/5 CXR: elevated R hemidiaphragm, otherwise clear  ASSESSMENT / PLAN:  PULMONARY A: COPD, not in exacerbation P:   -O2 as needed -prn albuterol -Dulera (was discharged on Advair from [facility]) -needs outpatient PFT  CARDIOVASCULAR A: Sinus tachycardia and Hypovolemic shock from presumed GI hemorrhage with lactic acidosis 09/2013 echo OSH > LVEF 60-65%, normal LV, RVSP 49 mmHg Peripheral arterial disease s/p stenting lower iliac arteries 07/2013 P:  -continue volume resuscitation with crystalloid -tele -EKG -monitor lactic acid -hold plavix given bleed  RENAL A:  No acute issues P:   -monitor and replace electrolytes as needed  GASTROINTESTINAL A:  Melena > presumed acute upper GI bleed > consider duodenal ulcer with recent prednisone/hospitalization and relief with eating Colitis cecum and ascending colon > no clear vascular obstruction noted on CTA; is this infectious?  P:   -GI consulted by ED, to see 7/6 AM -monitor lactic acid -serial h/h -PPI gtt  HEMATOLOGIC A:  Anemia from GI hemorrhage P:  -transfusion threshold < 8  INFECTIOUS A:  Colitis, recent antibiotics P:   -check c.diff  ENDOCRINE A:  DM2 P:   -ICU hyperglycemia  NEUROLOGIC A:  No acute issues P:    -monitor neuro status   I have personally obtained a history, examined the patient, evaluated laboratory and imaging results, formulated the assessment and plan and placed orders. CRITICAL CARE: The patient is critically ill with multiple organ systems failure and requires high complexity decision making  for assessment and support, frequent evaluation and titration of therapies, application of advanced monitoring technologies and extensive interpretation of multiple databases. Critical Care Time devoted to patient care services described in this note is 45 minutes.   Roselie Awkward, MD [place] P[facility]M Pager: [phone] Cell: [phone] If no response, call [phone]   10/18/2013, 5:12 PM

## 2013-10-18 NOTE — Progress Notes (Signed)
[facility] Progress Note Patient Name: SHAWANDA SIEVERT DOB: 03-Jul-1940 MRN: [id number]  Date of Service  10/18/2013   HPI/Events of Note   Hypotension, comfortable, HR improved, has not passed blood  eICU Interventions  Bolus 500cc saline, monitor closely, may need CVL   Intervention Category Major Interventions: Hypotension - evaluation and management  MCQUAID, DOUGLAS 10/18/2013, 10:05 PM

## 2013-10-18 NOTE — ED Provider Notes (Signed)
CSN: [id number]     Arrival date & time 10/18/13  1430 History   First MD Initiated Contact with Patient 10/18/13 1444     Chief Complaint  Patient presents with  . Hypotension     (Consider location/radiation/quality/duration/timing/severity/associated sxs/prior Treatment) HPI Comments: 73 year old female with history of high blood pressure, claudication, diabetes, lipids, CAD, tobacco abuse, vascular disease, recent admission and discharge from [facility] for bilateral pneumonia presents with abdominal pain and dark stools. Patient has had worsening melena since discharge from the hospital, patient is on Plavix for cardiac history.  Patient developed worsening abdominal ache diffuse since this morning. No history of similar. No fevers or chills however patient has been nauseated. Patient is not on iron supplements and stool is been dark throughout the day with multiple soft bowel movements. Patient had mild bleeding partially 5 years ago that she recalls no specific reason given. In her chart stomach ulcer however patient cannot recall. Patient has COPD with mild wheezing however feels she is not short of breath at this time. Nothing has improved her symptoms  The history is provided by the patient.    Past Medical History  Diagnosis Date  . Hypertension   . Hypercholesteremia   . Diabetes mellitus   . Anxiety   . Stomach ulcer   . Claudication   . Tobacco abuse   . Carotid artery disease   . Peripheral arterial disease     bilateral iliac artery stenosis by angiography  . COPD (chronic obstructive pulmonary disease)   . Shortness of breath     with exertion  . GERD (gastroesophageal reflux disease)    Past Surgical History  Procedure Laterality Date  . Tubal ligation    . Abdominal hysterectomy    . Breast reduction surgery    . Appendectomy    . Salivary gland surgery      scar tissue removed from left saliva glad  . Endarterectomy Right 03/06/2013    Procedure:  ENDARTERECTOMY CAROTID-RIGHT;  Surgeon: Serafina Mitchell, MD;  Location: [facility] OR;  Service: Vascular;  Laterality: Right;  . Patch angioplasty Right 03/06/2013    Procedure: PATCH ANGIOPLASTY of Right Carotid Artery using Vascu-Guard Patch;  Surgeon: Serafina Mitchell, MD;  Location: [facility] OR;  Service: Vascular;  Laterality: Right;  . Endarterectomy Left 05/07/2013    Procedure: LEFT CAROTID ARTERY ENDARTERECTOMY WITH VASCU-GUARD PATCH ANGIOPLASTY ;  Surgeon: Serafina Mitchell, MD;  Location: [facility] OR;  Service: Vascular;  Laterality: Left;   Family History  Problem Relation Age of Onset  . Stroke Mother   . Hypertension Mother   . Kidney failure Father    History  Substance Use Topics  . Smoking status: Former Smoker -- 1.50 packs/day for 56 years    Types: Cigarettes  . Smokeless tobacco: Former Systems developer    Quit date: 02/25/2013     Comment: Currently uses vapor (as of 07/07/2013)  . Alcohol Use: No   OB History   Grav Para Term Preterm Abortions TAB SAB Ect Mult Living                 Review of Systems  Constitutional: Negative for fever and chills.  HENT: Negative for congestion.   Eyes: Negative for visual disturbance.  Respiratory: Positive for wheezing. Negative for shortness of breath.   Cardiovascular: Negative for chest pain.  Gastrointestinal: Positive for nausea, abdominal pain, diarrhea and blood in stool. Negative for vomiting.  Genitourinary: Negative for dysuria and flank pain.  Musculoskeletal:  Negative for back pain, neck pain and neck stiffness.  Skin: Negative for rash.  Neurological: Positive for weakness (Gen.) and light-headedness. Negative for headaches.      Allergies  Estrace and Oxycodone-acetaminophen  Home Medications   Prior to Admission medications   Medication Sig Start Date End Date Taking? Authorizing Provider  diltiazem (CARDIZEM CD) 300 MG 24 hr capsule Take 300 mg by mouth daily. 10/11/13 10/11/14 Yes Historical Provider, MD  potassium chloride SA  (K-DUR,KLOR-CON) 20 MEQ tablet Take 40 mEq by mouth daily. 10/10/13 10/10/14 Yes Historical Provider, MD  amLODipine (NORVASC) 2.5 MG tablet Take 1 tablet (2.5 mg total) by mouth daily. 09/30/13   Lorretta Harp, MD  amoxicillin (AMOXIL) 875 MG tablet Take 875 mg by mouth 2 (two) times daily as needed (for infection).  04/27/13   Historical Provider, MD  aspirin EC 81 MG tablet Take 81 mg by mouth daily.    Historical Provider, MD  clopidogrel (PLAVIX) 75 MG tablet Take 1 tablet (75 mg total) by mouth daily with breakfast. 07/29/13   Brittainy Simmons, PA-C  diazepam (VALIUM) 5 MG tablet Take 5 mg by mouth every 12 (twelve) hours as needed for anxiety. Anxiety    Historical Provider, MD  fenofibrate micronized (LOFIBRA) 134 MG capsule Take 134 mg by mouth daily before breakfast.    Historical Provider, MD  fish oil-omega-3 fatty acids 1000 MG capsule Take 1 g by mouth 2 (two) times daily.     Historical Provider, MD  hydrochlorothiazide (MICROZIDE) 12.5 MG capsule Take 12.5 mg by mouth daily.    Historical Provider, MD  HYDROcodone-acetaminophen (NORCO/VICODIN) 5-325 MG per tablet Take 1-2 tablets by mouth every 6 (six) hours as needed for moderate pain. 05/08/13   Ulyses Amor, PA-C  lisinopril (PRINIVIL,ZESTRIL) 20 MG tablet Take 20 mg by mouth daily.    Historical Provider, MD  metFORMIN (GLUCOPHAGE) 500 MG tablet Take 500 mg by mouth daily with breakfast.     Historical Provider, MD  metoCLOPramide (REGLAN) 10 MG tablet Take 10 mg by mouth 2 (two) times daily.     Historical Provider, MD  metoprolol succinate (TOPROL-XL) 25 MG 24 hr tablet Take 12.5 mg by mouth daily.    Historical Provider, MD  omeprazole (PRILOSEC) 20 MG capsule Take 20 mg by mouth 2 (two) times daily.    Historical Provider, MD  pantoprazole (PROTONIX) 40 MG tablet Take 1 tablet (40 mg total) by mouth daily. 07/29/13   Brittainy Simmons, PA-C  pravastatin (PRAVACHOL) 40 MG tablet Take 40 mg by mouth daily.    Historical Provider,  MD   BP 126/50  Pulse 113  Temp(Src) 97 F (36.1 C) (Rectal)  Resp 20  Ht 5' 4.5" (1.638 m)  Wt 174 lb (78.926 kg)  BMI 29.42 kg/m2  SpO2 100% Physical Exam  Nursing note and vitals reviewed. Constitutional: She is oriented to person, place, and time. She appears well-developed. She appears distressed.  HENT:  Head: Normocephalic and atraumatic.  Mild dry mucous membranes  Eyes: Right eye exhibits no discharge. Left eye exhibits no discharge.  Pale conjunctiva  Neck: Normal range of motion. Neck supple. No tracheal deviation present.  Cardiovascular: Regular rhythm.  Tachycardia present.   Pulmonary/Chest: Effort normal. She has wheezes. She has no rales.  Abdominal: Soft. She exhibits no distension. There is tenderness (diffuse mild central moderate). There is no guarding.  Musculoskeletal: She exhibits no edema and no tenderness.  Neurological: She is alert and oriented to person, place,  and time.  Skin: Skin is warm. No rash noted. There is pallor.  Psychiatric: She has a normal mood and affect.    ED Course  Procedures (including critical care time) CRITICAL CARE Performed by: Mariea Clonts   Total critical care time: 40 min  Critical care time was exclusive of separately billable procedures and treating other patients.  Critical care was necessary to treat or prevent imminent or life-threatening deterioration.  Critical care was time spent personally by me on the following activities: development of treatment plan with patient and/or surrogate as well as nursing, discussions with consultants, evaluation of patient's response to treatment, examination of patient, obtaining history from patient or surrogate, ordering and performing treatments and interventions, ordering and review of laboratory studies, ordering and review of radiographic studies, pulse oximetry and re-evaluation of patient's condition.  Labs Review Labs Reviewed  CBC WITH DIFFERENTIAL - Abnormal;  Notable for the following:    WBC 19.0 (*)    RBC 2.64 (*)    Hemoglobin 8.3 (*)    HCT 24.9 (*)    Platelets 511 (*)    Neutrophils Relative % 90 (*)    Neutro Abs 17.1 (*)    Lymphocytes Relative 7 (*)    All other components within normal limits  COMPREHENSIVE METABOLIC PANEL - Abnormal; Notable for the following:    Glucose, Bld 162 (*)    BUN 50 (*)    Calcium 7.7 (*)    Total Protein 4.7 (*)    Albumin 2.5 (*)    Alkaline Phosphatase 32 (*)    Total Bilirubin <0.2 (*)    All other components within normal limits  I-STAT CHEM 8, ED - Abnormal; Notable for the following:    Sodium 136 (*)    BUN 47 (*)    Glucose, Bld 157 (*)    Hemoglobin 9.5 (*)    HCT 28.0 (*)    All other components within normal limits  I-STAT CG4 LACTIC ACID, ED - Abnormal; Notable for the following:    Lactic Acid, Venous 2.66 (*)    All other components within normal limits  POC OCCULT BLOOD, ED - Abnormal; Notable for the following:    Fecal Occult Bld POSITIVE (*)    All other components within normal limits  PROTIME-INR  HEMOGLOBIN AND HEMATOCRIT, BLOOD  MAGNESIUM  TYPE AND SCREEN  PREPARE RBC (CROSSMATCH)   1  Imaging Review Dg Chest Port 1 View  10/18/2013   CLINICAL DATA:  wheeze, recent pneumonia  EXAM: PORTABLE CHEST - 1 VIEW  COMPARISON:  Two-view chest 02/18/2013  FINDINGS: Persistent elevation of the right hemidiaphragm. Low lung volumes. Lungs are clear. Cardiac silhouette within the limits of normal. Atherosclerotic calcifications in the aorta. No acute osseous abnormalities.  IMPRESSION: No active disease.   Electronically Signed   By: Margaree Mackintosh M.D.   On: 10/18/2013 15:59     EKG Interpretation   Date/Time:  Sunday October 18 2013 14:37:30 EDT Ventricular Rate:  111 PR Interval:    QRS Duration: 83 QT Interval:  341 QTC Calculation: 463 R Axis:   60 Text Interpretation:  tachycardia, likely sinus however MAT possible poor  baseline.   Confirmed by Reather Converse  MD, Ayansh Feutz  ([id number]) on 10/18/2013 3:33:18 PM       MDM   Final diagnoses:  Melena  Gastrointestinal hemorrhage associated with other gastritis  Transient hypotension  General weakness  Lactic acidosis  acute copd abd pain  Patient presents diaphoretic, tachycardic  with gross blood/melena parenchyma. Concern for GI bleed as patient is on Plavix.  Blood work, type and screen and 2 units of blood ordered transfusion held until further monitoring. With vascular history lactic acidosis plan for CT angiogram to look for bowel ischemia. Neb given on arrival for wheezing.  Medications  0.9 %  sodium chloride infusion (0 mLs Intravenous Stopped 10/18/13 1515)    Followed by  0.9 %  sodium chloride infusion (1,000 mLs Intravenous New Bag/Given 10/18/13 1515)  pantoprazole (PROTONIX) 80 mg in sodium chloride 0.9 % 100 mL IVPB (80 mg Intravenous New Bag/Given 10/18/13 1613)  fentaNYL (SUBLIMAZE) injection 50 mcg (not administered)  piperacillin-tazobactam (ZOSYN) IVPB 3.375 g (not administered)  calcium gluconate 1 g in sodium chloride 0.9 % 100 mL IVPB (not administered)   Since last blood work patient had significant drop in hemoglobin and continue them melena in ER. One unit of blood ordered. GI page/consult at. Patient will be signed out to followup CT scan, reassess and admit likely to step down.  in the systolic blood pressure in the 50s which improved with fluid bolus.  Spoke with GI and critical care for admission.  The patients results and plan were reviewed and discussed.   Any x-rays performed were personally reviewed by myself.   Differential diagnosis were considered with the presenting HPI.  Medications  0.9 %  sodium chloride infusion (0 mLs Intravenous Stopped 10/18/13 1515)    Followed by  0.9 %  sodium chloride infusion (1,000 mLs Intravenous New Bag/Given 10/18/13 1515)  fentaNYL (SUBLIMAZE) injection 50 mcg (not administered)  piperacillin-tazobactam (ZOSYN) IVPB 3.375 g (not administered)   calcium gluconate 1 g in sodium chloride 0.9 % 100 mL IVPB (not administered)  pantoprazole (PROTONIX) 80 mg in sodium chloride 0.9 % 250 mL infusion (not administered)  pantoprazole (PROTONIX) 80 mg in sodium chloride 0.9 % 100 mL IVPB (80 mg Intravenous New Bag/Given 10/18/13 1613)  iohexol (OMNIPAQUE) 350 MG/ML injection 100 mL (100 mLs Intravenous Contrast Given 10/18/13 1634)     Filed Vitals:   10/18/13 1445 10/18/13 1517 10/18/13 1600 10/18/13 1616  BP: 126/50 106/50 108/43 103/45  Pulse: 113 113 116   Temp: 97 F (36.1 C)   98 F (36.7 C)  TempSrc: Rectal   Oral  Resp: _0 Height: 5' 4.5" (1.638 m)     Weight: 174 lb (78.926 kg)     SpO2: 100% 100% 96% 98%    Admission/ observation were discussed with the admitting physician, patient and/or family and they are comfortable with the plan.     Mariea Clonts, MD 10/18/13 [phone]

## 2013-10-18 NOTE — Progress Notes (Signed)
[facility] Progress Note Patient Name: Caitlin Oliver DOB: 29-Aug-1940 MRN: [id number]  Date of Service  10/18/2013   HPI/Events of Note   Pain all over  eICU Interventions  IV APAP   Intervention Category Intermediate Interventions: Pain - evaluation and management  Isael Stille 10/18/2013, 7:25 PM

## 2013-10-19 DIAGNOSIS — F17200 Nicotine dependence, unspecified, uncomplicated: Secondary | ICD-10-CM

## 2013-10-19 DIAGNOSIS — A0472 Enterocolitis due to Clostridium difficile, not specified as recurrent: Secondary | ICD-10-CM | POA: Diagnosis present

## 2013-10-19 LAB — CBC
HCT: 23.3 % — ABNORMAL LOW (ref 36.0–46.0)
HEMATOCRIT: 22.9 % — AB (ref 36.0–46.0)
HEMATOCRIT: 23 % — AB (ref 36.0–46.0)
HEMOGLOBIN: 7.7 g/dL — AB (ref 12.0–15.0)
Hemoglobin: 7.9 g/dL — ABNORMAL LOW (ref 12.0–15.0)
Hemoglobin: 7.6 g/dL — ABNORMAL LOW (ref 12.0–15.0)
MCH: 30.2 pg (ref 26.0–34.0)
MCH: 30.7 pg (ref 26.0–34.0)
MCH: 30.4 pg (ref 26.0–34.0)
MCHC: 33.6 g/dL (ref 30.0–36.0)
MCHC: 33.9 g/dL (ref 30.0–36.0)
MCHC: 33 g/dL (ref 30.0–36.0)
MCV: 89.8 fL (ref 78.0–100.0)
MCV: 90.7 fL (ref 78.0–100.0)
MCV: 92 fL (ref 78.0–100.0)
Platelets: 437 10*3/uL — ABNORMAL HIGH (ref 150–400)
Platelets: 428 10*3/uL — ABNORMAL HIGH (ref 150–400)
Platelets: 443 10*3/uL — ABNORMAL HIGH (ref 150–400)
RBC: 2.55 MIL/uL — ABNORMAL LOW (ref 3.87–5.11)
RBC: 2.57 MIL/uL — ABNORMAL LOW (ref 3.87–5.11)
RBC: 2.5 MIL/uL — ABNORMAL LOW (ref 3.87–5.11)
RDW: 15.5 % (ref 11.5–15.5)
RDW: 15.8 % — AB (ref 11.5–15.5)
RDW: 15.9 % — AB (ref 11.5–15.5)
WBC: 12.2 10*3/uL — ABNORMAL HIGH (ref 4.0–10.5)
WBC: 16.3 10*3/uL — AB (ref 4.0–10.5)
WBC: 17.7 10*3/uL — ABNORMAL HIGH (ref 4.0–10.5)

## 2013-10-19 LAB — GLUCOSE, CAPILLARY
GLUCOSE-CAPILLARY: 156 mg/dL — AB (ref 70–99)
GLUCOSE-CAPILLARY: 140 mg/dL — AB (ref 70–99)
Glucose-Capillary: 114 mg/dL — ABNORMAL HIGH (ref 70–99)
Glucose-Capillary: 99 mg/dL (ref 70–99)
Glucose-Capillary: 91 mg/dL (ref 70–99)
Glucose-Capillary: 161 mg/dL — ABNORMAL HIGH (ref 70–99)

## 2013-10-19 LAB — BASIC METABOLIC PANEL
Anion gap: 11 (ref 5–15)
BUN: 31 mg/dL — AB (ref 6–23)
CALCIUM: 7.9 mg/dL — AB (ref 8.4–10.5)
CO2: 22 mEq/L (ref 19–32)
Chloride: 105 mEq/L (ref 96–112)
Creatinine, Ser: 0.45 mg/dL — ABNORMAL LOW (ref 0.50–1.10)
GFR calc Af Amer: >90 mL/min (ref >90)
GLUCOSE: 135 mg/dL — AB (ref 70–99)
Potassium: 4.2 mEq/L (ref 3.7–5.3)
Sodium: 138 mEq/L (ref 137–147)

## 2013-10-19 LAB — CLOSTRIDIUM DIFFICILE BY PCR: Toxigenic C. Difficile by PCR: POSITIVE — AB

## 2013-10-19 LAB — LACTIC ACID, PLASMA: Lactic Acid, Venous: 2 mmol/L (ref 0.5–2.2)

## 2013-10-19 MED ORDER — FENTANYL CITRATE 0.05 MG/ML IJ SOLN
25.0000 ug | Freq: Four times a day (QID) | INTRAMUSCULAR | Status: DC | PRN
  Administered 2013-10-19 – 2013-10-20 (×2): 25 ug via INTRAVENOUS
  Filled 2013-10-19: qty 2

## 2013-10-19 MED ORDER — INSULIN ASPART 100 UNIT/ML ~~LOC~~ SOLN
0.0000 [IU] | Freq: Three times a day (TID) | SUBCUTANEOUS | Status: DC
  Administered 2013-10-19: 3 [IU] via SUBCUTANEOUS

## 2013-10-19 MED ORDER — METRONIDAZOLE IN NACL 5-0.79 MG/ML-% IV SOLN
500.0000 mg | Freq: Three times a day (TID) | INTRAVENOUS | Status: DC
  Administered 2013-10-19 – 2013-10-23 (×12): 500 mg via INTRAVENOUS
  Filled 2013-10-19 (×16): qty 100

## 2013-10-19 MED ORDER — VANCOMYCIN 50 MG/ML ORAL SOLUTION
500.0000 mg | Freq: Four times a day (QID) | ORAL | Status: DC
  Administered 2013-10-19 – 2013-10-23 (×16): 500 mg via ORAL
  Filled 2013-10-19 (×22): qty 10

## 2013-10-19 NOTE — Progress Notes (Signed)
PULMONARY / CRITICAL CARE MEDICINE   Name: Caitlin Oliver MRN: [id number] DOB: May 25, 1940    ADMISSION DATE:  10/18/2013 CONSULTATION DATE:  7/5  REFERRING MD :  Reather Converse PRIMARY SERVICE: PCCM  CHIEF COMPLAINT:  Weakness, diarrhea  BRIEF PATIENT DESCRIPTION: 73 y/o female with COPD and DM2 admitted on 7/5 with black tarry stools, found to have C.diff.  SIGNIFICANT EVENTS / STUDIES:  7/5 CTA ab/pelv >> advanced atherosclerosis of aorta and branch vessels, bilat iliac stents, inflammatory colitis, diverticulosis without diverticulitis  LINES / TUBES:   CULTURES: 7/5 c.diff >>positive  ANTIBIOTICS: 7/5 zosyn ER x1 7/6 po vanc >> 7/6 IV flagyl >>   SUBJECTIVE:  Feeling better this morning, had dark stools this morning but non-bloody  VITAL SIGNS: Temp:  [97 F (36.1 C)-99.4 F (37.4 C)] 98.1 F (36.7 C) (07/06 0734) Pulse Rate:  [99-124] 99 (07/06 1000) Resp:  [17-36] 17 (07/06 1000) BP: (78-132)/(43-81) 96/50 mmHg (07/06 1000) SpO2:  [95 %-100 %] 97 % (07/06 1000) Weight:  [77.1 kg (169 lb 15.6 oz)-78.926 kg (174 lb)] 78.9 kg (173 lb 15.1 oz) (07/06 0453) HEMODYNAMICS:   VENTILATOR SETTINGS:   INTAKE / OUTPUT: Intake/Output     07/05 0701 - 07/06 0700 07/06 0701 - 07/07 0700   I.V. (mL/kg) 1925 (24.4) 450 (5.7)   Blood 12.5    IV Piggyback 600    Total Intake(mL/kg) 2537.5 (32.2) 450 (5.7)   Urine (mL/kg/hr) 1580 125 (0.4)   Stool 1    Total Output 1581 125   Net +956.5 +325          PHYSICAL EXAMINATION: General:  Good spirits, hungry Neuro:  A&OX4, maew HEENT:  NCAT, EOMi, PERRL Cardiovascular:  RRR, no mgr Lungs:  CTA B today Abdomen:  BS+, non tender today Skin:  Rosacea face, some bruising forearms from old IVs, otherwise normal  LABS:  CBC  Recent Labs Lab 10/18/13 2045 10/19/13 0137 10/19/13 0720  WBC 14.0* 12.2* 16.3*  HGB 8.7* 7.7* 7.9*  HCT 26.1* 22.9* 23.3*  PLT 479* 437* 428*   Coag's  Recent Labs Lab 10/18/13 1455  INR 1.17    BMET  Recent Labs Lab 10/18/13 1455 10/18/13 1509 10/19/13 0137  NA 137 136* 138  K 4.6 4.6 4.2  CL 103 106 105  CO2 20  --  22  BUN 50* 47* 31*  CREATININE 0.51 0.50 0.45*  GLUCOSE 162* 157* 135*   Electrolytes  Recent Labs Lab 10/18/13 1455 10/19/13 0137  CALCIUM 7.7* 7.9*  MG 1.6  --    Sepsis Markers  Recent Labs Lab 10/18/13 1508 10/18/13 2045  LATICACIDVEN 2.66* 4.0*   ABG No results found for this basename: PHART, PCO2ART, PO2ART,  in the last 168 hours Liver Enzymes  Recent Labs Lab 10/18/13 1455  AST 13  ALT 15  ALKPHOS 32*  BILITOT <0.2*  ALBUMIN 2.5*   Cardiac Enzymes No results found for this basename: TROPONINI, PROBNP,  in the last 168 hours Glucose  Recent Labs Lab 10/18/13 1924 10/18/13 2314 10/19/13 0400 10/19/13 0728  GLUCAP 190* 156* 114* 99    Imaging  7/5 CXR: elevated R hemidiaphragm, otherwise clear  ASSESSMENT / PLAN:  GASTROINTESTINAL A:  Melena> appears to be bloody stools from c.diff colitis exacerbated by plavix 7/6 > not actively bleeding P:   -advance diet -no indication for GI consult, endoscopy right now considering diagnosis of colitis -stat repeat lactic acid -continue serial h/h -d/c PPI gtt  PULMONARY A: COPD,  not in exacerbation P:   -O2 as needed -prn albuterol -Dulera (was discharged on Advair from [place]) -needs outpatient PFT  CARDIOVASCULAR A: Sinus tachycardia and Hypovolemic shock > resolved 09/2013 echo OSH > LVEF 60-65%, normal LV, RVSP 49 mmHg Peripheral arterial disease s/p stenting lower iliac arteries 07/2013 P:  -continue volume resuscitation with crystalloid -tele -stat repeat lactic acid -hold plavix given bleed, would resume prior to discharge  RENAL A:  No acute issues P:   -monitor and replace electrolytes as needed  HEMATOLOGIC A:  Anemia from GI hemorrhage  P:  -transfusion threshold < 8  INFECTIOUS A:  C diff colitis P:   -start oral vanc, IV  flagyl  ENDOCRINE A:  DM2 P:   -carb modified diet -change SSI and POC to AC/HS  NEUROLOGIC A:  No acute issues P:   -monitor neuro status  Stable for transfer to [place], MD [place] PCCM Pager: [phone] Cell: [phone] If no response, call [phone]   10/19/2013, 10:52 AM

## 2013-10-19 NOTE — Progress Notes (Signed)
CRITICAL VALUE ALERT  Critical value received:  C-diff  Date of notification:  10/19/13   Time of notification:  1000  Critical value read back:yes  Nurse who received alert:  Lily Kocher   MD notified (1st page):  Dr Lake Bells   Time of first page:  10/19/2013

## 2013-10-19 NOTE — Progress Notes (Signed)
Updated critical care MD of recent CBC results of Hg 7.7 and HCT 22.9.  Patient remains stable with MAP of 60-65 and has not passed blood.  No orders obtained at this time and will continue to monitor closely.

## 2013-10-20 DIAGNOSIS — J441 Chronic obstructive pulmonary disease with (acute) exacerbation: Secondary | ICD-10-CM

## 2013-10-20 DIAGNOSIS — I739 Peripheral vascular disease, unspecified: Secondary | ICD-10-CM

## 2013-10-20 DIAGNOSIS — D62 Acute posthemorrhagic anemia: Secondary | ICD-10-CM | POA: Diagnosis present

## 2013-10-20 DIAGNOSIS — I10 Essential (primary) hypertension: Secondary | ICD-10-CM

## 2013-10-20 LAB — CBC WITH DIFFERENTIAL/PLATELET
BASOS ABS: 0 10*3/uL (ref 0.0–0.1)
Basophils Absolute: 0 10*3/uL (ref 0.0–0.1)
Basophils Relative: 0 % (ref 0–1)
Basophils Relative: 0 % (ref 0–1)
EOS ABS: 0.1 10*3/uL (ref 0.0–0.7)
Eosinophils Absolute: 0.1 10*3/uL (ref 0.0–0.7)
Eosinophils Relative: 0 % (ref 0–5)
Eosinophils Relative: 1 % (ref 0–5)
HCT: 21.4 % — ABNORMAL LOW (ref 36.0–46.0)
HCT: 29.2 % — ABNORMAL LOW (ref 36.0–46.0)
HEMOGLOBIN: 9.9 g/dL — AB (ref 12.0–15.0)
Hemoglobin: 7.2 g/dL — ABNORMAL LOW (ref 12.0–15.0)
LYMPHS ABS: 1.5 10*3/uL (ref 0.7–4.0)
LYMPHS PCT: 13 % (ref 12–46)
Lymphocytes Relative: 14 % (ref 12–46)
Lymphs Abs: 1.2 10*3/uL (ref 0.7–4.0)
MCH: 30.9 pg (ref 26.0–34.0)
MCH: 30.9 pg (ref 26.0–34.0)
MCHC: 33.6 g/dL (ref 30.0–36.0)
MCHC: 33.9 g/dL (ref 30.0–36.0)
MCV: 91.8 fL (ref 78.0–100.0)
MCV: 91.3 fL (ref 78.0–100.0)
Monocytes Absolute: 0.6 10*3/uL (ref 0.1–1.0)
Monocytes Absolute: 0.6 10*3/uL (ref 0.1–1.0)
Monocytes Relative: 5 % (ref 3–12)
Monocytes Relative: 6 % (ref 3–12)
NEUTROS ABS: 7.1 10*3/uL (ref 1.7–7.7)
Neutro Abs: 9.4 10*3/uL — ABNORMAL HIGH (ref 1.7–7.7)
Neutrophils Relative %: 82 % — ABNORMAL HIGH (ref 43–77)
Neutrophils Relative %: 79 % — ABNORMAL HIGH (ref 43–77)
PLATELETS: 402 10*3/uL — AB (ref 150–400)
Platelets: 445 10*3/uL — ABNORMAL HIGH (ref 150–400)
RBC: 2.33 MIL/uL — AB (ref 3.87–5.11)
RBC: 3.2 MIL/uL — ABNORMAL LOW (ref 3.87–5.11)
RDW: 15.8 % — ABNORMAL HIGH (ref 11.5–15.5)
RDW: 14.9 % (ref 11.5–15.5)
WBC: 11.5 10*3/uL — AB (ref 4.0–10.5)
WBC: 8.9 10*3/uL (ref 4.0–10.5)

## 2013-10-20 LAB — GLUCOSE, CAPILLARY
GLUCOSE-CAPILLARY: 100 mg/dL — AB (ref 70–99)
Glucose-Capillary: 93 mg/dL (ref 70–99)
Glucose-Capillary: 111 mg/dL — ABNORMAL HIGH (ref 70–99)
Glucose-Capillary: 103 mg/dL — ABNORMAL HIGH (ref 70–99)

## 2013-10-20 LAB — BASIC METABOLIC PANEL
ANION GAP: 9 (ref 5–15)
BUN: 22 mg/dL (ref 6–23)
CO2: 26 mEq/L (ref 19–32)
Calcium: 7.7 mg/dL — ABNORMAL LOW (ref 8.4–10.5)
Chloride: 106 mEq/L (ref 96–112)
Creatinine, Ser: 0.62 mg/dL (ref 0.50–1.10)
GFR calc Af Amer: >90 mL/min (ref >90)
GFR, EST NON AFRICAN AMERICAN: 88 mL/min — AB (ref >90)
Glucose, Bld: 97 mg/dL (ref 70–99)
POTASSIUM: 4 meq/L (ref 3.7–5.3)
SODIUM: 141 meq/L (ref 137–147)

## 2013-10-20 LAB — PREPARE RBC (CROSSMATCH)

## 2013-10-20 MED ORDER — MORPHINE SULFATE 2 MG/ML IJ SOLN
1.0000 mg | INTRAMUSCULAR | Status: DC | PRN
Start: 2013-10-20 — End: 2013-10-21
  Administered 2013-10-20 – 2013-10-21 (×6): 1 mg via INTRAVENOUS
  Filled 2013-10-20 (×6): qty 1

## 2013-10-20 MED ORDER — DILTIAZEM HCL ER COATED BEADS 300 MG PO CP24: 300.0000 mg | ORAL_CAPSULE | Freq: Every day | ORAL | Status: DC

## 2013-10-20 MED ORDER — PANTOPRAZOLE SODIUM 40 MG PO TBEC
40.0000 mg | DELAYED_RELEASE_TABLET | Freq: Every day | ORAL | Status: DC
  Administered 2013-10-20 – 2013-10-21 (×2): 40 mg via ORAL
  Filled 2013-10-20 (×2): qty 1

## 2013-10-20 MED ORDER — DILTIAZEM 12 MG/ML ORAL SUSPENSION: 30.0000 mg | Freq: Two times a day (BID) | ORAL | Status: DC

## 2013-10-20 MED ORDER — DILTIAZEM 12 MG/ML ORAL SUSPENSION
30.0000 mg | Freq: Two times a day (BID) | ORAL | Status: DC
  Administered 2013-10-20 – 2013-10-21 (×2): 30 mg via ORAL
  Filled 2013-10-20 (×3): qty 3

## 2013-10-20 NOTE — Progress Notes (Signed)
UR completed.  Yekaterina Escutia, RN BSN [facility] CCM Trauma/Neuro ICU Case Manager [phone]  

## 2013-10-20 NOTE — Progress Notes (Signed)
[name]  Caitlin Oliver QIO:[id number] DOB: Jun 13, 1940 DOA: 10/18/2013 PCP: Tula Nakayama  Time spent :  Brief narrative: 73 y/o female with COPD and DM2 admitted on 7/5 with a presumed upper GI bleed. She has no prior history of a GI bleed. She was recently admitted to [facility] for an acute respiratory problem and possibly lower lobe pneumonia and was discharged home on 6/27. While in the hospital she said that she developed abdominal pain, some mild nausea, and black frequent stools. Her breathing had actually improved since her discharge, but her GI symptoms had persisted and now she endorses profound weakness.   She came into the ED on 7/5 due to weakness, dizziness, and inability to walk. She noted that she had several black bloody bowel movements per day which were not formed and were small volume. She has not had vomiting. Eating actually made the abdominal pain go away for a few minutes but then it returned and remained present continuously.   She didn't recall much of her history from [place]. Admitting MD review of her discharge summary from revealed she was admitted from 6/23 to 6/27 for a COPD exacerbation. She had an echo which showed an LVEF 60-65%, normal LV, and mildly elevated RVSP at 49mHg. A CTA of the chest was negative for PE, but had tree and bud abnormalities in the RLL. She was discharged home on prednisone and levaquin in addition to her other medicines. She has been taking plavix regularly since her iliac stents were placed in April of 2015  HPI/Subjective: Still endorsing weakness and the same abdominal pain she was experiencing prior to admission.  Assessment/Plan: Active Problems:   GI bleed/C. difficile colitis -still with diarrhea but pt unsure if still having melena -cont Flagyl and vancomycin -would not give solid food while having diarrhea and abdominal pain so decrease to clears -agree with PCCM that  current GIB sx's are explained by colitis -cont IVFs -add low dose IV MSO4 for abdominal pain    Acute blood loss anemia (symptomatic) -baseline Hgb 13 -currently just above 7 -pt endorsing weakness/malaise and also having tachycardia and soft BP readings -will transfuse 2 units PRBCs today -rpt CBC post transfusion    Essential hypertension -on thiazide diuretic, ACE I and CCB pre admit -tachycardia could be 2/2 CCB withdrawal so after blood trial very low dose short acting Cardizem     Type 2 diabetes mellitus -controlled - Cont SSI    COPD/Tobacco abuse -no evidence of exacebation    PVD (peripheral vascular disease) with claudication -was on Plavix pre admit -resume once GIB sx's resolved and/or when colitis resolved   DVT prophylaxis: SCDs Code Status: DO NOT RESUSCITATE Family Communication: No family at bedside Disposition Plan/Expected LOS: Step down   Consultants: Critical care medicine  Procedures: None  Antibiotics: Flagyl 7/6 >>> Vancomycin 7/6 >>>  Objective: Blood pressure 98/50, pulse 101, temperature 98.1 F (36.7 C), temperature source Oral, resp. rate 21, height 5' 4.5" (1.638 m), weight 176 lb 5.9 oz (80 kg), SpO2 97.00%.  Intake/Output Summary (Last 24 hours) at 10/20/13 1138 Last data filed at 10/20/13 1057  Gross per 24 hour  Intake   3990 ml  Output    400 ml  Net   3590 ml     Exam: General: No acute respiratory distress Lungs: Clear to auscultation bilaterally without wheezes or crackles, RA Cardiovascular: Regular tachycardic rate and rhythm without murmur gallop or  rub normal S1 and S2, no peripheral edema or JVD Abdomen: Tender diffusely, nondistended, soft, bowel sounds positive, without guarding or rebound, no ascites, no appreciable mass Musculoskeletal: No significant cyanosis, clubbing of bilateral lower extremities   Scheduled Meds:  Scheduled Meds: . diltiazem  30 mg Oral Q12H  . insulin aspart  0-15 Units  Subcutaneous TID WC  . vancomycin  500 mg Oral 4 times per day   And  . metronidazole  500 mg Intravenous Q8H  . mometasone-formoterol  2 puff Inhalation BID  . pantoprazole  40 mg Oral Daily   Continuous Infusions: . sodium chloride 1,000 mL (10/18/13 1515)    Data Reviewed: Basic Metabolic Panel:  Recent Labs Lab 10/18/13 1455 10/18/13 1509 10/19/13 0137 10/20/13 0335  NA 137 136* 138 141  K 4.6 4.6 4.2 4.0  CL 103 106 105 106  CO2 20  --  22 26  GLUCOSE 162* 157* 135* 97  BUN 50* 47* 31* 22  CREATININE 0.51 0.50 0.45* 0.62  CALCIUM 7.7*  --  7.9* 7.7*  MG 1.6  --   --   --    Liver Function Tests:  Recent Labs Lab 10/18/13 1455  AST 13  ALT 15  ALKPHOS 32*  BILITOT <0.2*  PROT 4.7*  ALBUMIN 2.5*   No results found for this basename: LIPASE, AMYLASE,  in the last 168 hours No results found for this basename: AMMONIA,  in the last 168 hours CBC:  Recent Labs Lab 10/18/13 1455  10/18/13 2045 10/19/13 0137 10/19/13 0720 10/19/13 1831 10/20/13 0335  WBC 19.0*  --  14.0* 12.2* 16.3* 17.7* 11.5*  NEUTROABS 17.1*  --   --   --   --   --  9.4*  HGB 8.3*  < > 8.7* 7.7* 7.9* 7.6* 7.2*  HCT 24.9*  < > 26.1* 22.9* 23.3* 23.0* 21.4*  MCV 94.3  --  91.6 89.8 90.7 92.0 91.8  PLT 511*  --  479* 437* 428* 443* 445*  < > = values in this interval not displayed. Cardiac Enzymes: No results found for this basename: CKTOTAL, CKMB, CKMBINDEX, TROPONINI,  in the last 168 hours BNP (last 3 results) No results found for this basename: PROBNP,  in the last 8760 hours CBG:  Recent Labs Lab 10/19/13 0728 10/19/13 1140 10/19/13 1606 10/19/13 2108 10/20/13 0822  GLUCAP 99 91 161* 140* 93    Recent Results (from the past 240 hour(s))  MRSA PCR SCREENING     Status: None   Collection Time    10/18/13  6:57 PM      Result Value Ref Range Status   MRSA by PCR NEGATIVE  NEGATIVE Final   Comment:            The GeneXpert MRSA Assay (FDA     approved for NASAL  specimens     only), is one component of a     comprehensive MRSA colonization     surveillance program. It is not     intended to diagnose MRSA     infection nor to guide or     monitor treatment for     MRSA infections.  CLOSTRIDIUM DIFFICILE BY PCR     Status: Abnormal   Collection Time    10/19/13  6:22 AM      Result Value Ref Range Status   C difficile by pcr POSITIVE (*) NEGATIVE Final   Comment: CRITICAL RESULT CALLED TO, READ BACK BY AND VERIFIED WITH:  S. [place] RN 10:00 10/19/13 ([email or web address])     Studies:  Recent x-ray studies have been reviewed in detail by the Attending Physician       Erin Hearing, [place] [facility] Office  [phone] Pager [phone]   **If unable to reach the above provider after paging please contact the [place] @ [phone]  On-Call/Text Page:      [email or web address]      password [email or web address]  If 7PM-7AM, please contact night-coverage [email or web address] Password [email or web address] 10/20/2013, 11:38 AM   LOS: 2 days   Examined patient and discussed assessment and plan with ANP Ebony Hail agree with the above plan Discussed plan with patient answered all questions. Patient with complex multiple medical problems direct patient care> 35 minutes

## 2013-10-20 NOTE — Clinical Documentation Improvement (Signed)
10/20/13  Presents with CDIF Colitis, Hypovolemic Shock documented, anemia.   Hgb has dropped from 9.5 to 7.2  Hct dropped from 28 to 21.4  Transfused  Please clarify the type of anemia you are treating and document findings in next progress note and discharge summary.   Expected Acute Blood Loss Anemia  Acute Blood Loss Anemia  Acute on chronic blood loss anemia  Chronic blood loss anemia  Precipitous drop in Hematocrit  Other Condition________________   Hoyt Koch, Zoila Shutter ,RN Clinical Documentation Specialist:  [place] Information Management

## 2013-10-21 LAB — GLUCOSE, CAPILLARY
GLUCOSE-CAPILLARY: 111 mg/dL — AB (ref 70–99)
Glucose-Capillary: 102 mg/dL — ABNORMAL HIGH (ref 70–99)
Glucose-Capillary: 103 mg/dL — ABNORMAL HIGH (ref 70–99)
Glucose-Capillary: 92 mg/dL (ref 70–99)

## 2013-10-21 LAB — COMPREHENSIVE METABOLIC PANEL
ALK PHOS: 37 U/L — AB (ref 39–117)
ALT: 24 U/L (ref 0–35)
ANION GAP: 10 (ref 5–15)
AST: 22 U/L (ref 0–37)
Albumin: 2.6 g/dL — ABNORMAL LOW (ref 3.5–5.2)
BUN: 7 mg/dL (ref 6–23)
CO2: 27 meq/L (ref 19–32)
Calcium: 8 mg/dL — ABNORMAL LOW (ref 8.4–10.5)
Chloride: 102 mEq/L (ref 96–112)
Creatinine, Ser: 0.52 mg/dL (ref 0.50–1.10)
GLUCOSE: 90 mg/dL (ref 70–99)
POTASSIUM: 3.8 meq/L (ref 3.7–5.3)
Sodium: 139 mEq/L (ref 137–147)
Total Bilirubin: 0.3 mg/dL (ref 0.3–1.2)
Total Protein: 4.6 g/dL — ABNORMAL LOW (ref 6.0–8.3)

## 2013-10-21 LAB — CBC
HCT: 28.9 % — ABNORMAL LOW (ref 36.0–46.0)
HEMOGLOBIN: 9.8 g/dL — AB (ref 12.0–15.0)
MCH: 30.4 pg (ref 26.0–34.0)
MCHC: 33.9 g/dL (ref 30.0–36.0)
MCV: 89.8 fL (ref 78.0–100.0)
Platelets: 432 10*3/uL — ABNORMAL HIGH (ref 150–400)
RBC: 3.22 MIL/uL — AB (ref 3.87–5.11)
RDW: 15.2 % (ref 11.5–15.5)
WBC: 8.1 10*3/uL (ref 4.0–10.5)

## 2013-10-21 LAB — TYPE AND SCREEN
ABO/RH(D): A POS
ANTIBODY SCREEN: NEGATIVE
UNIT DIVISION: 0
Unit division: 0
Unit division: 0

## 2013-10-21 MED ORDER — FENTANYL CITRATE 0.05 MG/ML IJ SOLN
25.0000 ug | INTRAMUSCULAR | Status: DC | PRN
  Administered 2013-10-21 – 2013-10-22 (×2): 25 ug via INTRAVENOUS
  Filled 2013-10-21 (×2): qty 2

## 2013-10-21 MED ORDER — DILTIAZEM 12 MG/ML ORAL SUSPENSION: 30.0000 mg | Freq: Four times a day (QID) | ORAL | Status: DC

## 2013-10-21 MED ORDER — FAMOTIDINE 20 MG PO TABS
20.0000 mg | ORAL_TABLET | Freq: Every day | ORAL | Status: DC
  Administered 2013-10-21 – 2013-10-23 (×3): 20 mg via ORAL
  Filled 2013-10-21 (×4): qty 1

## 2013-10-21 MED ORDER — SODIUM CHLORIDE 0.9 % IV SOLN
1000.0000 mL | INTRAVENOUS | Status: DC
Start: 2013-10-21 — End: 2013-10-22
  Administered 2013-10-22: 1000 mL via INTRAVENOUS

## 2013-10-21 NOTE — Progress Notes (Signed)
[name]  Caitlin Oliver WSF:[id number] DOB: 1940-06-15 DOA: 10/18/2013 PCP: Tula Nakayama  Time spent : 35 mins  Brief narrative: 73 y/o female with COPD and DM2 admitted on 7/5 with a presumed upper GI bleed. She has no prior history of a GI bleed. She was recently admitted to [facility] for an acute respiratory problem and possibly lower lobe pneumonia and was discharged home on 6/27. While in the hospital she said that she developed abdominal pain, some mild nausea, and black frequent stools. Her breathing had actually improved since her discharge, but her GI symptoms had persisted and now she endorses profound weakness.   She came into the ED on 7/5 due to weakness, dizziness, and inability to walk. She noted that she had several black bloody bowel movements per day which were not formed and were small volume. She had not had vomiting.   She didn't recall much of her history from [place]. Admitting MD review of her discharge summary from revealed she was admitted from 6/23 to 6/27 for a COPD exacerbation. She had an echo which showed an LVEF 60-65%, normal LV, and mildly elevated RVSP at 57mHg. A CTA of the chest was negative for PE, but had tree and bud abnormalities in the RLL. She was discharged home on prednisone and levaquin in addition to her other medicines. She has been taking plavix regularly since her iliac stents were placed in April of 2015  HPI/Subjective: States feels better -still with abdominal pain but also hungry for more than clears  Assessment/Plan:    GI bleed/C. difficile colitis -still with diarrhea but now stools are brown -cont Flagyl and PO vancomycin -would not give solid food while having diarrhea and abdominal pain so continue clears -agree with PCCM that current GIB sx's are explained by colitis -cont IVFs -add low dose IV MSO4 for abdominal pain    Acute blood loss anemia (symptomatic) -baseline Hgb  13 -pt endorsed weakness/malaise and also having tachycardia and soft BP readings so transfused 2 units PRBCs 7/7 -CBC post transfusion up to 9.8 and tachycardia has resolved and BP has increased    Essential hypertension -on thiazide diuretic, ACE I and CCB pre admit -resumed on very low dose CCB 7/7    Type 2 diabetes mellitus -controlled -Cont SSI    COPD/Tobacco abuse -no evidence of exacebation    PVD (peripheral vascular disease) with claudication -was on Plavix pre admit -resume once GIB sx's resolved and/or when colitis resolved  DVT prophylaxis: SCDs Code Status: DO NOT RESUSCITATE Family Communication: No family at bedside Disposition Plan/Expected LOS: Transfer to floor  Consultants: Critical care medicine  Procedures: None  Antibiotics: Flagyl 7/6 >> PO Vancomycin 7/6 >>  Objective: Blood pressure 135/65, pulse 90, temperature 98.2 F (36.8 C), temperature source Oral, resp. rate 17, height 5' 4.5" (1.638 m), weight 176 lb 5.9 oz (80 kg), SpO2 100.00%.  Intake/Output Summary (Last 24 hours) at 10/21/13 1333 Last data filed at 10/21/13 1100  Gross per 24 hour  Intake 9253.75 ml  Output   3175 ml  Net 6078.75 ml   Exam: General: No acute respiratory distress Lungs: Clear to auscultation bilaterally without wheezes or crackles Cardiovascular: Regular rate and rhythm without murmur gallop or rub normal S1 and S2, no peripheral edema or JVD Abdomen: minimally tender but non focal, nondistended, soft, bowel sounds positive, no guarding or rebound, no ascites, no appreciable mass Musculoskeletal: No significant cyanosis, clubbing  of bilateral lower extremities  Scheduled Meds:  Scheduled Meds: . diltiazem  30 mg Oral Q12H  . insulin aspart  0-15 Units Subcutaneous TID WC  . vancomycin  500 mg Oral 4 times per day   And  . metronidazole  500 mg Intravenous Q8H  . mometasone-formoterol  2 puff Inhalation BID  . pantoprazole  40 mg Oral Daily   Data  Reviewed: Basic Metabolic Panel:  Recent Labs Lab 10/18/13 1455 10/18/13 1509 10/19/13 0137 10/20/13 0335 10/21/13 0404  NA 137 136* 138 141 139  K 4.6 4.6 4.2 4.0 3.8  CL 103 106 105 106 102  CO2 20  --  _0 GLUCOSE 162* 157* 135* 97 90  BUN 50* 47* 31* 22 7  CREATININE 0.51 0.50 0.45* 0.62 0.52  CALCIUM 7.7*  --  7.9* 7.7* 8.0*  MG 1.6  --   --   --   --    Liver Function Tests:  Recent Labs Lab 10/18/13 1455 10/21/13 0404  AST 13 22  ALT 15 24  ALKPHOS 32* 37*  BILITOT <0.2* 0.3  PROT 4.7* 4.6*  ALBUMIN 2.5* 2.6*   CBC:  Recent Labs Lab 10/18/13 1455  10/19/13 0720 10/19/13 1831 10/20/13 0335 10/20/13 1954 10/21/13 0404  WBC 19.0*  < > 16.3* 17.7* 11.5* 8.9 8.1  NEUTROABS 17.1*  --   --   --  9.4* 7.1  --   HGB 8.3*  < > 7.9* 7.6* 7.2* 9.9* 9.8*  HCT 24.9*  < > 23.3* 23.0* 21.4* 29.2* 28.9*  MCV 94.3  < > 90.7 92.0 91.8 91.3 89.8  PLT 511*  < > 428* 443* 445* 402* 432*  < > = values in this interval not displayed.  CBG:  Recent Labs Lab 10/20/13 1148 10/20/13 1623 10/20/13 2138 10/21/13 0758 10/21/13 1238  GLUCAP 111* 103* 100* 111* 102*    Recent Results (from the past 240 hour(s))  MRSA PCR SCREENING     Status: None   Collection Time    10/18/13  6:57 PM      Result Value Ref Range Status   MRSA by PCR NEGATIVE  NEGATIVE Final   Comment:            The GeneXpert MRSA Assay (FDA     approved for NASAL specimens     only), is one component of a     comprehensive MRSA colonization     surveillance program. It is not     intended to diagnose MRSA     infection nor to guide or     monitor treatment for     MRSA infections.  CLOSTRIDIUM DIFFICILE BY PCR     Status: Abnormal   Collection Time    10/19/13  6:22 AM      Result Value Ref Range Status   C difficile by pcr POSITIVE (*) NEGATIVE Final   Comment: CRITICAL RESULT CALLED TO, READ BACK BY AND VERIFIED WITH:     S. [place] RN 10:00 10/19/13 ([email or web address])     Studies:  Recent  x-ray studies have been reviewed in detail by the Attending Physician       Erin Hearing, [place] [facility] Office  [phone] Pager [phone]   **If unable to reach the above provider after paging please contact the [place] @ [phone]  On-Call/Text Page:      [email or web address]      password [email or web address]  If 7PM-7AM, please contact night-coverage [email or web address] Password [email or web address]  10/21/2013, 1:33 PM   LOS: 3 days   I have personally examined this patient and reviewed the entire database. I have reviewed the above note, made any necessary editorial changes, and agree with its content.  Cherene Altes, MD [facility]

## 2013-10-22 LAB — GLUCOSE, CAPILLARY
GLUCOSE-CAPILLARY: 104 mg/dL — AB (ref 70–99)
GLUCOSE-CAPILLARY: 130 mg/dL — AB (ref 70–99)
Glucose-Capillary: 110 mg/dL — ABNORMAL HIGH (ref 70–99)
Glucose-Capillary: 95 mg/dL (ref 70–99)

## 2013-10-22 LAB — BASIC METABOLIC PANEL
Anion gap: 12 (ref 5–15)
BUN: 4 mg/dL — AB (ref 6–23)
CO2: 24 meq/L (ref 19–32)
CREATININE: 0.58 mg/dL (ref 0.50–1.10)
Calcium: 8.1 mg/dL — ABNORMAL LOW (ref 8.4–10.5)
Chloride: 101 mEq/L (ref 96–112)
GFR calc Af Amer: >90 mL/min (ref >90)
GFR calc non Af Amer: 90 mL/min — ABNORMAL LOW (ref >90)
GLUCOSE: 106 mg/dL — AB (ref 70–99)
POTASSIUM: 3.9 meq/L (ref 3.7–5.3)
Sodium: 137 mEq/L (ref 137–147)

## 2013-10-22 LAB — CBC
HEMATOCRIT: 31.9 % — AB (ref 36.0–46.0)
Hemoglobin: 10.9 g/dL — ABNORMAL LOW (ref 12.0–15.0)
MCH: 31.5 pg (ref 26.0–34.0)
MCHC: 34.2 g/dL (ref 30.0–36.0)
MCV: 92.2 fL (ref 78.0–100.0)
Platelets: 383 10*3/uL (ref 150–400)
RBC: 3.46 MIL/uL — AB (ref 3.87–5.11)
RDW: 15.2 % (ref 11.5–15.5)
WBC: 8 10*3/uL (ref 4.0–10.5)

## 2013-10-22 MED ORDER — TRAMADOL HCL 50 MG PO TABS
50.0000 mg | ORAL_TABLET | Freq: Once | ORAL | Status: AC
  Administered 2013-10-22: 50 mg via ORAL
  Filled 2013-10-22: qty 1

## 2013-10-22 MED ORDER — FENOFIBRATE 160 MG PO TABS
160.0000 mg | ORAL_TABLET | Freq: Every day | ORAL | Status: DC
  Administered 2013-10-22 – 2013-10-23 (×2): 160 mg via ORAL
  Filled 2013-10-22 (×2): qty 1

## 2013-10-22 MED ORDER — SODIUM CHLORIDE 0.9 % IV SOLN
1000.0000 mL | INTRAVENOUS | Status: AC
  Administered 2013-10-22: 1000 mL via INTRAVENOUS

## 2013-10-22 MED ORDER — CLOPIDOGREL BISULFATE 75 MG PO TABS: 75.0000 mg | ORAL_TABLET | Freq: Every day | ORAL | Status: DC

## 2013-10-22 MED ORDER — SIMVASTATIN 20 MG PO TABS
20.0000 mg | ORAL_TABLET | Freq: Every day | ORAL | Status: DC
  Administered 2013-10-22: 20 mg via ORAL
  Filled 2013-10-22 (×2): qty 1
  Filled 2013-10-22: qty 2

## 2013-10-22 NOTE — Progress Notes (Signed)
Progress Note  PAYTAN RECINE YQM:[id number] DOB: 1940-04-18 DOA: 10/18/2013 PCP: Tula Nakayama  Time spent : 35 mins  Brief narrative: 73 y/o female with COPD and DM2 admitted on 7/5 with a presumed upper GI bleed. She has no prior history of a GI bleed. She was recently admitted to [facility] for an acute respiratory problem and possibly lower lobe pneumonia and was discharged home on 6/27. While in the hospital she said that she developed abdominal pain, some mild nausea, and black frequent stools. Her breathing had actually improved since her discharge, but her GI symptoms had persisted and now she endorses profound weakness.   She came into the ED on 7/5 due to weakness, dizziness, and inability to walk. She noted that she had several black bloody bowel movements per day which were not formed and were small volume. She had not had vomiting.   She didn't recall much of her history from [place]. Admitting MD review of her discharge summary from revealed she was admitted from 6/23 to 6/27 for a COPD exacerbation. She had an echo which showed an LVEF 60-65%, normal LV, and mildly elevated RVSP at 53mHg. A CTA of the chest was negative for PE, but had tree and bud abnormalities in the RLL. She was discharged home on prednisone and levaquin in addition to her other medicines. She has been taking plavix regularly since her iliac stents were placed in April of 2015  HPI/Subjective: States feels better -still with abdominal pain but also hungry for more than clears  Assessment/Plan:    GI bleed/C. difficile colitis On Flagyl and vancomycin with improvement, pain and diarrhea much better, leukocytosis resolved, will advance diet. If stable discharge home.     Acute blood loss anemia (symptomatic) -baseline Hgb 13, GI bleed clinically lower due to colitis per ICU team. Status post 2 units packed RBC with stable itch and 8 which will be monitored. Aspirin Plavix on hold.       Essential hypertension -on thiazide diuretic, ACE I and CCB pre admit, -resumed on very low dose CCB 7/7    Type 2 diabetes mellitus -controlled, Cont SSI  CBG (last 3)   Recent Labs  10/21/13 1634 10/21/13 2218 10/22/13 0739  GLUCAP 103* 92 110*       COPD/Tobacco abuse -no evidence of exacebation     PVD (peripheral vascular disease) with claudication -was on aspirin and Plavix pre admit, both on hold due to lower GI bleed.     DVT prophylaxis: SCDs Code Status: DO NOT RESUSCITATE Family Communication: No family at bedside Disposition Plan/Expected LOS: Transfer to floor  Consultants: Critical care medicine  Procedures: None  Antibiotics: Flagyl 7/6 >> PO Vancomycin 7/6 >>  Objective: Blood pressure 130/73, pulse 87, temperature 98.4 F (36.9 C), temperature source Oral, resp. rate 18, height 5' 4.5" (1.638 m), weight 78.2 kg (172 lb 6.4 oz), SpO2 97.00%.  Intake/Output Summary (Last 24 hours) at 10/22/13 1007 Last data filed at 10/22/13 0857  Gross per 24 hour  Intake   1865 ml  Output   1600 ml  Net    265 ml   Exam: General: No acute respiratory distress Lungs: Clear to auscultation bilaterally without wheezes or crackles Cardiovascular: Regular rate and rhythm without murmur gallop or rub normal S1 and S2, no peripheral edema or JVD Abdomen: non tender but non focal, nondistended, soft, bowel sounds positive, no guarding or rebound, no ascites, no appreciable mass Musculoskeletal: No significant cyanosis,  clubbing of bilateral lower extremities  Scheduled Meds:  Scheduled Meds: . [START ON 10/23/2013] clopidogrel  75 mg Oral Q breakfast  . famotidine  20 mg Oral Daily  . fenofibrate  160 mg Oral Daily  . insulin aspart  0-15 Units Subcutaneous TID WC  . vancomycin  500 mg Oral 4 times per day   And  . metronidazole  500 mg Intravenous Q8H  . mometasone-formoterol  2 puff Inhalation BID  . simvastatin  20 mg Oral q1800   Data  Reviewed: Basic Metabolic Panel:  Recent Labs Lab 10/18/13 1455 10/18/13 1509 10/19/13 0137 10/20/13 0335 10/21/13 0404 10/22/13 0025  NA 137 136* 138 141 139 137  K 4.6 4.6 4.2 4.0 3.8 3.9  CL 103 106 105 106 102 101  CO2 20  --  _0 GLUCOSE 162* 157* 135* 97 90 106*  BUN 50* 47* 31* 22 7 4*  CREATININE 0.51 0.50 0.45* 0.62 0.52 0.58  CALCIUM 7.7*  --  7.9* 7.7* 8.0* 8.1*  MG 1.6  --   --   --   --   --    Liver Function Tests:  Recent Labs Lab 10/18/13 1455 10/21/13 0404  AST 13 22  ALT 15 24  ALKPHOS 32* 37*  BILITOT <0.2* 0.3  PROT 4.7* 4.6*  ALBUMIN 2.5* 2.6*   CBC:  Recent Labs Lab 10/18/13 1455  10/19/13 1831 10/20/13 0335 10/20/13 1954 10/21/13 0404 10/22/13 0025  WBC 19.0*  < > 17.7* 11.5* 8.9 8.1 8.0  NEUTROABS 17.1*  --   --  9.4* 7.1  --   --   HGB 8.3*  < > 7.6* 7.2* 9.9* 9.8* 10.9*  HCT 24.9*  < > 23.0* 21.4* 29.2* 28.9* 31.9*  MCV 94.3  < > 92.0 91.8 91.3 89.8 92.2  PLT 511*  < > 443* 445* 402* 432* 383  < > = values in this interval not displayed.  CBG:  Recent Labs Lab 10/21/13 0758 10/21/13 1238 10/21/13 1634 10/21/13 2218 10/22/13 0739  GLUCAP 111* 102* 103* 92 110*    Recent Results (from the past 240 hour(s))  MRSA PCR SCREENING     Status: None   Collection Time    10/18/13  6:57 PM      Result Value Ref Range Status   MRSA by PCR NEGATIVE  NEGATIVE Final   Comment:            The GeneXpert MRSA Assay (FDA     approved for NASAL specimens     only), is one component of a     comprehensive MRSA colonization     surveillance program. It is not     intended to diagnose MRSA     infection nor to guide or     monitor treatment for     MRSA infections.  CLOSTRIDIUM DIFFICILE BY PCR     Status: Abnormal   Collection Time    10/19/13  6:22 AM      Result Value Ref Range Status   C difficile by pcr POSITIVE (*) NEGATIVE Final   Comment: CRITICAL RESULT CALLED TO, READ BACK BY AND VERIFIED WITH:     Chauncey Cruel MARSH RN  10:00 10/19/13 ([email or web address])      Thurnell Lose M.D on 10/22/2013 at 10:10 AM  Between 7am to 7pm - Pager - [phone], After 7pm go to [email or web address] - password [email or web address]  And look for the night coverage person covering me after hours  [place]  [phone]    LOS: 4 days

## 2013-10-23 LAB — HEMOGLOBIN AND HEMATOCRIT, BLOOD
HEMATOCRIT: 30.2 % — AB (ref 36.0–46.0)
Hemoglobin: 10 g/dL — ABNORMAL LOW (ref 12.0–15.0)

## 2013-10-23 LAB — GLUCOSE, CAPILLARY
GLUCOSE-CAPILLARY: 115 mg/dL — AB (ref 70–99)
Glucose-Capillary: 89 mg/dL (ref 70–99)

## 2013-10-23 LAB — BASIC METABOLIC PANEL
ANION GAP: 12 (ref 5–15)
BUN: 8 mg/dL (ref 6–23)
CHLORIDE: 103 meq/L (ref 96–112)
CO2: 25 mEq/L (ref 19–32)
CREATININE: 0.66 mg/dL (ref 0.50–1.10)
Calcium: 8.2 mg/dL — ABNORMAL LOW (ref 8.4–10.5)
GFR calc non Af Amer: 86 mL/min — ABNORMAL LOW (ref >90)
Glucose, Bld: 127 mg/dL — ABNORMAL HIGH (ref 70–99)
POTASSIUM: 3.8 meq/L (ref 3.7–5.3)
Sodium: 140 mEq/L (ref 137–147)

## 2013-10-23 MED ORDER — VANCOMYCIN 50 MG/ML ORAL SOLUTION: ORAL | Status: DC

## 2013-10-23 MED ORDER — METRONIDAZOLE 500 MG PO TABS: 500.0000 mg | ORAL_TABLET | Freq: Three times a day (TID) | ORAL | Status: DC

## 2013-10-23 MED ORDER — ASPIRIN EC 81 MG PO TBEC: 81.0000 mg | DELAYED_RELEASE_TABLET | Freq: Every day | ORAL | Status: DC

## 2013-10-23 MED ORDER — CLOPIDOGREL BISULFATE 75 MG PO TABS: 75.0000 mg | ORAL_TABLET | Freq: Every day | ORAL | Status: DC

## 2013-10-23 NOTE — Evaluation (Signed)
Physical Therapy Evaluation Patient Details Name: Caitlin Oliver MRN: [id number] DOB: Jul 19, 1940 Today's Date: 10/23/2013   History of Present Illness    73 y/o female with COPD and DM2 admitted on 7/5 with a presumed upper GI bleed. Pt + for C. Diff; treated with flagyl and vancomycin.    Clinical Impression  Pt adm from home due to the above. No focal weakness or deficits indicated at this time. Pt able to perform high level balance activities without difficulty and manage steps with use of handrail. Pt reports family is very supportive and will (A) her as needed. No further acute PT needs warranted at this time.     Follow Up Recommendations No PT follow up    Equipment Recommendations  None recommended by PT    Recommendations for Other Services       Precautions / Restrictions Precautions Precautions: None Restrictions Weight Bearing Restrictions: No      Mobility  Bed Mobility Overal bed mobility: Independent                Transfers Overall transfer level: Modified independent Equipment used: None             General transfer comment: transfers with use of hands   Ambulation/Gait Ambulation/Gait assistance: Modified independent (Device/Increase time) Ambulation Distance (Feet): 250 Feet Assistive device: None Gait Pattern/deviations: WFL(Within Functional Limits) Gait velocity: WFL Gait velocity interpretation: at or above normal speed for age/gender General Gait Details: pt able to perform high level balance activities without difficulty; no LOB with ambulation   Stairs Stairs: Yes Stairs assistance: Modified independent (Device/Increase time) Stair Management: One rail Left;Alternating pattern;Forwards Number of Stairs: 5 General stair comments: no LOB noted; good safety awareness on steps   Wheelchair Mobility    Modified Rankin (Stroke Patients Only)       Balance Overall balance assessment: No apparent balance deficits (not formally  assessed)                           High level balance activites: Direction changes;Turns;Sudden stops;Head turns High Level Balance Comments: no LOB noted; good technique and safety awareness              Pertinent Vitals/Pain No c/o pain    Home Living Family/patient expects to be discharged to:: Private residence Living Arrangements: Alone Available Help at Discharge: Family;Available PRN/intermittently Type of Home: House Home Access: Stairs to enter Entrance Stairs-Rails: Right Entrance Stairs-Number of Steps: 6 Home Layout: One level Home Equipment: Walker - standard;Grab bars - tub/shower;Grab bars - toilet;Shower seat Additional Comments: pt has handicap height toilets     Prior Function Level of Independence: Independent         Comments: ambulates with walker on "weak days"; pt drives and is independent      Hand Dominance        Extremity/Trunk Assessment   Upper Extremity Assessment: Overall WFL for tasks assessed           Lower Extremity Assessment: Overall WFL for tasks assessed      Cervical / Trunk Assessment: Normal  Communication   Communication: No difficulties  Cognition Arousal/Alertness: Awake/alert Behavior During Therapy: WFL for tasks assessed/performed Overall Cognitive Status: Within Functional Limits for tasks assessed                      General Comments      Exercises  Assessment/Plan    PT Assessment Patent does not need any further PT services  PT Diagnosis     PT Problem List    PT Treatment Interventions     PT Goals (Current goals can be found in the Care Plan section) Acute Rehab PT Goals Patient Stated Goal: home today PT Goal Formulation: No goals set, d/c therapy    Frequency     Barriers to discharge        Co-evaluation               End of Session   Activity Tolerance: Patient tolerated treatment well Patient left: in chair;with call bell/phone within  reach;Other (comment) (respiratory present) Nurse Communication: Mobility status         Time: 3643-8377 PT Time Calculation (min): 15 min   Charges:   PT Evaluation $Initial PT Evaluation Tier I: 1 Procedure PT Treatments $Gait Training: 8-22 mins   PT G CodesGustavus Bryant , [place]  [phone]  10/23/2013, 9:14 AM

## 2013-10-23 NOTE — Progress Notes (Signed)
Discussed discharge summary with patient. Patient received Rx. Reviewed all medications with patient. Patient questions were answered and patient was provided education. Patient ready for discharge and waiting for ride.

## 2013-10-23 NOTE — Discharge Instructions (Signed)
Follow with Primary MD KAPLAN,KRISTEN, PA-C in 4 days   Get CBC, CMP,  checked  by Primary MD next visit.    Activity: As tolerated with Full fall precautions use walker/cane & assistance as needed   Disposition Home     Diet: Heart Healthy Low Carb.  Accuchecks 4 times/day, Once in AM empty stomach and then before each meal. Log in all results and show them to your Prim.MD in 3 days. If any glucose reading is under 80 or above 300 call your Prim MD immidiately. Follow Low glucose instructions for glucose under 80 as instructed.    For Heart failure patients - Check your Weight same time everyday, if you gain over 2 pounds, or you develop in leg swelling, experience more shortness of breath or chest pain, call your Primary MD immediately. Follow Cardiac Low Salt Diet and 1.8 lit/day fluid restriction.   On your next visit with her primary care physician please Get Medicines reviewed and adjusted.  Please request your Prim.MD to go over all Hospital Tests and Procedure/Radiological results at the follow up, please get all Hospital records sent to your Prim MD by signing hospital release before you go home.   If you experience worsening of your admission symptoms, develop shortness of breath, life threatening emergency, suicidal or homicidal thoughts you must seek medical attention immediately by calling 911 or calling your MD immediately  if symptoms less severe.  You Must read complete instructions/literature along with all the possible adverse reactions/side effects for all the Medicines you take and that have been prescribed to you. Take any new Medicines after you have completely understood and accpet all the possible adverse reactions/side effects.   Do not drive, operating heavy machinery, perform activities at heights, swimming or participation in water activities or provide baby sitting services if your were admitted for syncope or siezures until you have seen by Primary MD or  a Neurologist and advised to do so again.  Do not drive when taking Pain medications.    Do not take more than prescribed Pain, Sleep and Anxiety Medications  Special Instructions: If you have smoked or chewed Tobacco  in the last 2 yrs please stop smoking, stop any regular Alcohol  and or any Recreational drug use.  Wear Seat belts while driving.   Please note  You were cared for by a hospitalist during your hospital stay. If you have any questions about your discharge medications or the care you received while you were in the hospital after you are discharged, you can call the unit and asked to speak with the hospitalist on call if the hospitalist that took care of you is not available. Once you are discharged, your primary care physician will handle any further medical issues. Please note that NO REFILLS for any discharge medications will be authorized once you are discharged, as it is imperative that you return to your primary care physician (or establish a relationship with a primary care physician if you do not have one) for your aftercare needs so that they can reassess your need for medications and monitor your lab values.

## 2013-10-23 NOTE — Care Management Note (Signed)
Page 1 of 1   10/23/2013     10:21:15 AM CARE MANAGEMENT NOTE 10/23/2013  Patient:  Caitlin Oliver, Caitlin Oliver   Account Number:  [id number]  Date Initiated:  10/21/2013  Documentation initiated by:  MAYO,HENRIETTA  Subjective/Objective Assessment:   dx GI bleed; lives alone, nephew lives in area and supports as needed    PCP  Bing Matter  PA     Action/Plan:   Anticipated DC Date:  10/23/2013   Anticipated DC Plan:  [place]  CM consult      Choice offered to / List presented to:  C-1 Patient           Status of service:   Medicare Important Message given?  YES (If response is "NO", the following Medicare IM given date fields will be blank) Date Medicare IM given:  10/21/2013 Medicare IM given by:  MAYO,HENRIETTA Date Additional Medicare IM given:   Additional Medicare IM given by:    Discharge Disposition:  HOME/SELF CARE  Per UR Regulation:  Reviewed for med. necessity/level of care/duration of stay  If discussed at [place] of Stay Meetings, dates discussed:    Comments:  10-23-13 Orders for [facility] / PT / aide and walker and 3 in 1 . Discussed with patient , patient currently refusing . MD and bedside nurse aware. Magdalen Spatz RN BSN

## 2013-10-23 NOTE — Discharge Summary (Signed)
Caitlin Oliver, is a 73 y.o. female  DOB 10-Apr-1941  MRN [id number].  Admission date:  10/18/2013  Admitting Physician  Juanito Doom, MD  Discharge Date:  10/23/2013   Primary MD  Tula Nakayama  Recommendations for primary care physician for things to follow:   Monitor CBC, CMP closely, blood pressure medications, aspirin, Plavix need to be resumed once clinically stable and diarrhea improved.   Admission Diagnosis  Mucopurulent chronic bronchitis [491.1] Melena [578.1] Lactic acidosis [276.2] General weakness [780.79] Abdominal pain, acute [789.00, 338.19] COPD with acute exacerbation [491.21] Transient hypotension [796.3] Type 2 diabetes mellitus without complication [366.44] Gastrointestinal hemorrhage associated with other gastritis [535.51]   Discharge Diagnosis  Mucopurulent chronic bronchitis [491.1] Melena [578.1] Lactic acidosis [276.2] General weakness [780.79] Abdominal pain, acute [789.00, 338.19] COPD with acute exacerbation [491.21] Transient hypotension [796.3] Type 2 diabetes mellitus without complication [034.74] Gastrointestinal hemorrhage associated with other gastritis [535.51]    Active Problems:   Essential hypertension   Type 2 diabetes mellitus   Tobacco abuse   PVD (peripheral vascular disease) with claudication   GI bleed   COPD   C. difficile colitis   Acute blood loss anemia      Past Medical History  Diagnosis Date  . Hypertension   . Hypercholesteremia   . Diabetes mellitus   . Anxiety   . Stomach ulcer   . Claudication   . Tobacco abuse   . Carotid artery disease   . Peripheral arterial disease     bilateral iliac artery stenosis by angiography  . COPD (chronic obstructive pulmonary disease)   . Shortness of breath     with exertion  . GERD (gastroesophageal  reflux disease)     Past Surgical History  Procedure Laterality Date  . Tubal ligation    . Abdominal hysterectomy    . Breast reduction surgery    . Appendectomy    . Salivary gland surgery      scar tissue removed from left saliva glad  . Endarterectomy Right 03/06/2013    Procedure: ENDARTERECTOMY CAROTID-RIGHT;  Surgeon: Serafina Mitchell, MD;  Location: [place];  Service: Vascular;  Laterality: Right;  . Patch angioplasty Right 03/06/2013    Procedure: PATCH ANGIOPLASTY of Right Carotid Artery using Vascu-Guard Patch;  Surgeon: Serafina Mitchell, MD;  Location: [place];  Service: Vascular;  Laterality: Right;  . Endarterectomy Left 05/07/2013    Procedure: LEFT CAROTID ARTERY ENDARTERECTOMY WITH VASCU-GUARD PATCH ANGIOPLASTY ;  Surgeon: Serafina Mitchell, MD;  Location: [place];  Service: Vascular;  Laterality: Left;       History of present illness and  Hospital Course:     Kindly see H&P for history of present illness and admission details, please review complete Labs, Consult reports and Test reports for all details in brief  HPI  from the history and physical done on the day of admission   72 y/o female with COPD and DM2 admitted on 7/5 with a presumed upper GI bleed. She has no  prior history of a GI bleed. She was recently admitted to [facility] for an acute respiratory problem and possibly lower lobe pneumonia and was discharged home on 6/27. While in the hospital she said that she developed abdominal pain, some mild nausea, and black frequent stools. Her breathing had actually improved since her discharge, but her GI symptoms had persisted and now she endorses profound weakness.  She came into the ED on 7/5 due to weakness, dizziness, and inability to walk. She noted that she had several black bloody bowel movements per day which were not formed and were small volume. She had not had vomiting.   She didn't recall much of her history from [place]. Admitting MD review of her  discharge summary from revealed she was admitted from 6/23 to 6/27 for a COPD exacerbation. She had an echo which showed an LVEF 60-65%, normal LV, and mildly elevated RVSP at 15mHg. A CTA of the chest was negative for PE, but had tree and bud abnormalities in the RLL. She was discharged home on prednisone and levaquin in addition to her other medicines. She has been taking plavix regularly since her iliac stents were placed in April of 2015    Hospital Course    GI bleed/C. difficile colitis  On Flagyl and vancomycin with improvement, pain and diarrhea much better, leukocytosis resolved, written diet and eager to go home. We'll place on 14 more days of Cipro and Flagyl.   Acute blood loss anemia (symptomatic)  -baseline Hgb 13, GI bleed clinically lower due to colitis per ICU team. Status post 2 units packed RBC with stable H&H, Aspirin Plavix on hold. We'll request PCP to monitor H&H in the outpatient setting, resume aspirin Plavix once clinically stable and area resolved. Have requested patient to follow with her primary gastroenterologist Dr.Hayes within a week.    Essential hypertension  -on thiazide diuretic, ACE I and CCB pre admit, -resume a calcium channel blocker now, we'll request PCP to monitor study is completely resolved ACE and diuretic can be added.    Type 2 diabetes mellitus  Low carb diet with Glucophage on board at home to be continued.   COPD/Tobacco abuse  -no evidence of exacerbation, consult obtained from smoking.   PVD (peripheral vascular disease) with claudication  -was on aspirin and Plavix pre admit, both on hold due to lower GI bleed. We'll request PCP to resume once clinically stable.      Discharge Condition: Stable   Follow UP  Follow-up Information   Follow up with KAPLAN,KRISTEN, PA-C. Schedule an appointment as soon as possible for a visit in 3 days.   Specialty:  Family Medicine   Contact information:   [address] [place] [phone]      Follow up with HAYES,JOHN C, MD. Schedule an appointment as soon as possible for a visit in 1 week. (for EGD and Colonoscopy - GI Bleed)    Specialty:  Gastroenterology   Contact information:   [address] [place] [phone]        Discharge Instructions  and  Discharge Medications     Discharge Instructions   Discharge instructions    Complete by:  As directed   Follow with Primary MD KAPLAN,KRISTEN, PA-C in 4 days   Get CBC, CMP,  checked  by Primary MD next visit.    Activity: As tolerated with Full fall precautions use walker/cane & assistance as needed  Disposition Home     Diet: Heart Healthy Low Carb.  Accuchecks 4 times/day, Once in AM empty stomach and then before each meal. Log in all results and show them to your Prim.MD in 3 days. If any glucose reading is under 80 or above 300 call your Prim MD immidiately. Follow Low glucose instructions for glucose under 80 as instructed.    For Heart failure patients - Check your Weight same time everyday, if you gain over 2 pounds, or you develop in leg swelling, experience more shortness of breath or chest pain, call your Primary MD immediately. Follow Cardiac Low Salt Diet and 1.8 lit/day fluid restriction.   On your next visit with her primary care physician please Get Medicines reviewed and adjusted.  Please request your Prim.MD to go over all Hospital Tests and Procedure/Radiological results at the follow up, please get all Hospital records sent to your Prim MD by signing hospital release before you go home.   If you experience worsening of your admission symptoms, develop shortness of breath, life threatening emergency, suicidal or homicidal thoughts you must seek medical attention immediately by calling 911 or calling your MD immediately  if symptoms less severe.  You Must read complete instructions/literature along with all the possible adverse  reactions/side effects for all the Medicines you take and that have been prescribed to you. Take any new Medicines after you have completely understood and accpet all the possible adverse reactions/side effects.   Do not drive, operating heavy machinery, perform activities at heights, swimming or participation in water activities or provide baby sitting services if your were admitted for syncope or siezures until you have seen by Primary MD or a Neurologist and advised to do so again.  Do not drive when taking Pain medications.    Do not take more than prescribed Pain, Sleep and Anxiety Medications  Special Instructions: If you have smoked or chewed Tobacco  in the last 2 yrs please stop smoking, stop any regular Alcohol  and or any Recreational drug use.  Wear Seat belts while driving.   Please note  You were cared for by a hospitalist during your hospital stay. If you have any questions about your discharge medications or the care you received while you were in the hospital after you are discharged, you can call the unit and asked to speak with the hospitalist on call if the hospitalist that took care of you is not available. Once you are discharged, your primary care physician will handle any further medical issues. Please note that NO REFILLS for any discharge medications will be authorized once you are discharged, as it is imperative that you return to your primary care physician (or establish a relationship with a primary care physician if you do not have one) for your aftercare needs so that they can reassess your need for medications and monitor your lab values.  Follow with Primary MD KAPLAN,KRISTEN, PA-C in 4 days   Get CBC, CMP,  checked  by Primary MD next visit.    Activity: As tolerated with Full fall precautions use walker/cane & assistance as needed   Disposition Home     Diet: Heart Healthy Low Carb.  Accuchecks 4 times/day, Once in AM empty stomach and then before each  meal. Log in all results and show them to your Prim.MD in 3 days. If any glucose reading is under 80 or above 300 call your Prim MD immidiately. Follow Low glucose instructions for glucose under 80 as instructed.  For Heart failure patients - Check your Weight same time everyday, if you gain over 2 pounds, or you develop in leg swelling, experience more shortness of breath or chest pain, call your Primary MD immediately. Follow Cardiac Low Salt Diet and 1.8 lit/day fluid restriction.   On your next visit with her primary care physician please Get Medicines reviewed and adjusted.  Please request your Prim.MD to go over all Hospital Tests and Procedure/Radiological results at the follow up, please get all Hospital records sent to your Prim MD by signing hospital release before you go home.   If you experience worsening of your admission symptoms, develop shortness of breath, life threatening emergency, suicidal or homicidal thoughts you must seek medical attention immediately by calling 911 or calling your MD immediately  if symptoms less severe.  You Must read complete instructions/literature along with all the possible adverse reactions/side effects for all the Medicines you take and that have been prescribed to you. Take any new Medicines after you have completely understood and accpet all the possible adverse reactions/side effects.   Do not drive, operating heavy machinery, perform activities at heights, swimming or participation in water activities or provide baby sitting services if your were admitted for syncope or siezures until you have seen by Primary MD or a Neurologist and advised to do so again.  Do not drive when taking Pain medications.    Do not take more than prescribed Pain, Sleep and Anxiety Medications  Special Instructions: If you have smoked or chewed Tobacco  in the last 2 yrs please stop smoking, stop any regular Alcohol  and or any Recreational drug use.  Wear  Seat belts while driving.   Please note  You were cared for by a hospitalist during your hospital stay. If you have any questions about your discharge medications or the care you received while you were in the hospital after you are discharged, you can call the unit and asked to speak with the hospitalist on call if the hospitalist that took care of you is not available. Once you are discharged, your primary care physician will handle any further medical issues. Please note that NO REFILLS for any discharge medications will be authorized once you are discharged, as it is imperative that you return to your primary care physician (or establish a relationship with a primary care physician if you do not have one) for your aftercare needs so that they can reassess your need for medications and monitor your lab values.     Increase activity slowly    Complete by:  As directed             Medication List    STOP taking these medications       hydrochlorothiazide 12.5 MG capsule  Commonly known as:  MICROZIDE     lisinopril 20 MG tablet  Commonly known as:  PRINIVIL,ZESTRIL     pantoprazole 40 MG tablet  Commonly known as:  PROTONIX      TAKE these medications       aspirin EC 81 MG tablet  Take 1 tablet (81 mg total) by mouth daily. Resume after 1 week once Ok by PCP     CARTIA XT 300 MG 24 hr capsule  Generic drug:  diltiazem  Take 300 mg by mouth daily.     clopidogrel 75 MG tablet  Commonly known as:  PLAVIX  Take 1 tablet (75 mg total) by mouth daily with breakfast. Resume after 1 week once  Ok by PCP     diazepam 5 MG tablet  Commonly known as:  VALIUM  Take 5 mg by mouth every 12 (twelve) hours. Anxiety     fenofibrate micronized 134 MG capsule  Commonly known as:  LOFIBRA  Take 134 mg by mouth daily before breakfast.     fish oil-omega-3 fatty acids 1000 MG capsule  Take 1 g by mouth 2 (two) times daily.     metFORMIN 500 MG tablet  Commonly known as:  GLUCOPHAGE   Take 500 mg by mouth daily with breakfast.     metroNIDAZOLE 500 MG tablet  Commonly known as:  FLAGYL  Take 1 tablet (500 mg total) by mouth 3 (three) times daily.     potassium chloride SA 20 MEQ tablet  Commonly known as:  K-DUR,KLOR-CON  Take 40 mEq by mouth daily.     pravastatin 40 MG tablet  Commonly known as:  PRAVACHOL  Take 40 mg by mouth daily.     vancomycin 50 mg/mL oral solution  Commonly known as:  VANCOCIN  292m every 6 hrs 2 week supply, dispense as needed          Diet and Activity recommendation: See Discharge Instructions above   Consults obtained -    Major procedures and Radiology Reports - PLEASE review detailed and final reports for all details, in brief -      Dg Chest Port 1 View  10/18/2013   CLINICAL DATA:  wheeze, recent pneumonia  EXAM: PORTABLE CHEST - 1 VIEW  COMPARISON:  Two-view chest 02/18/2013  FINDINGS: Persistent elevation of the right hemidiaphragm. Low lung volumes. Lungs are clear. Cardiac silhouette within the limits of normal. Atherosclerotic calcifications in the aorta. No acute osseous abnormalities.  IMPRESSION: No active disease.   Electronically Signed   By: HMargaree MackintoshM.D.   On: 10/18/2013 15:59   Ct Cta Abd/pel W/cm &/or W/o Cm  10/18/2013   CLINICAL DATA:  GI bleed.  Hypotension.  Abdominal pain.  EXAM: CTA ABDOMEN AND PELVIS wITHOUT AND WITH CONTRAST  TECHNIQUE: Multidetector CT imaging of the abdomen and pelvis was performed using the standard protocol during bolus administration of intravenous contrast. Multiplanar reconstructed images and MIPs were obtained and reviewed to evaluate the vascular anatomy.  CONTRAST:  1018mOMNIPAQUE IOHEXOL 350 MG/ML SOLN  COMPARISON:  None.  FINDINGS: The lung bases are clear. The heart is normal in size. No pericardial effusion. Coronary artery calcifications are noted. There are moderate atherosclerotic calcifications involving the distal descending thoracic aorta. The esophagus is  grossly normal.  Advanced atherosclerotic calcifications involving the aorta and branch vessels. No aneurysm or dissection. Very dense calcifications at the iliac artery bifurcation with bilateral stents.  The solid abdominal organs are unremarkable. No inflammatory changes or mass lesions. The gallbladder is normal. No common bile duct dilatation.  The stomach, duodenum and small bowel are grossly normal without oral contrast. There is moderate inflammation of the ascending colon. The terminal ileum is normal. The appendix is not identified. There is advanced diverticulosis of the sigmoid colon and descending colon but no findings for acute diverticulitis.  The bladder is unremarkable. The uterus is surgically absent. No pelvic mass are adenopathy. No free pelvic fluid collections. No inguinal mass or adenopathy.  The bony structures are intact.  No destructive bone lesions.  Review of the MIP images confirms the above findings.  IMPRESSION: 1. Advanced atherosclerotic calcifications involving the aorta and branch vessels. There are bilateral iliac artery stents.  No occlusive disease. 2. Inflammatory or infectious process involving the cecum and proximal ascending colon. 3. Advanced diverticulosis of the descending and sigmoid colon but no findings for acute diverticulitis.   Electronically Signed   By: Kalman Jewels M.D.   On: 10/18/2013 17:17    Micro Results     Recent Results (from the past 240 hour(s))  MRSA PCR SCREENING     Status: None   Collection Time    10/18/13  6:57 PM      Result Value Ref Range Status   MRSA by PCR NEGATIVE  NEGATIVE Final   Comment:            The GeneXpert MRSA Assay (FDA     approved for NASAL specimens     only), is one component of a     comprehensive MRSA colonization     surveillance program. It is not     intended to diagnose MRSA     infection nor to guide or     monitor treatment for     MRSA infections.  CLOSTRIDIUM DIFFICILE BY PCR     Status:  Abnormal   Collection Time    10/19/13  6:22 AM      Result Value Ref Range Status   C difficile by pcr POSITIVE (*) NEGATIVE Final   Comment: CRITICAL RESULT CALLED TO, READ BACK BY AND VERIFIED WITH:     Chauncey Cruel MARSH RN 10:00 10/19/13 ([email or web address])       Today   Subjective:   Marieke Lubke today has no headache,no chest abdominal pain,no new weakness tingling or numbness, feels much better wants to go home today.    Objective:   Blood pressure 137/68, pulse 76, temperature 97.7 F (36.5 C), temperature source Oral, resp. rate 20, height 5' 4.5" (1.638 m), weight 78.4 kg (172 lb 13.5 oz), SpO2 97.00%.   Intake/Output Summary (Last 24 hours) at 10/23/13 1047 Last data filed at 10/23/13 0900  Gross per 24 hour  Intake   1060 ml  Output      0 ml  Net   1060 ml    Exam Awake Alert, Oriented x 3, No new F.N deficits, Normal affect [place].AT,PERRAL Supple Neck,No JVD, No cervical lymphadenopathy appriciated.  Symmetrical Chest wall movement, Good air movement bilaterally, CTAB RRR,No Gallops,Rubs or new Murmurs, No Parasternal Heave +ve B.Sounds, Abd Soft, Non tender, No organomegaly appriciated, No rebound -guarding or rigidity. No Cyanosis, Clubbing or edema, No new Rash or bruise  Data Review   CBC w Diff: Lab Results  Component Value Date   WBC 8.0 10/22/2013   HGB 10.0* 10/23/2013   HCT 30.2* 10/23/2013   PLT 383 10/22/2013   LYMPHOPCT 14 10/20/2013   MONOPCT 6 10/20/2013   EOSPCT 1 10/20/2013   BASOPCT 0 10/20/2013    CMP: Lab Results  Component Value Date   NA 140 10/23/2013   K 3.8 10/23/2013   CL 103 10/23/2013   CO2 25 10/23/2013   BUN 8 10/23/2013   CREATININE 0.66 10/23/2013   CREATININE 0.84 07/20/2013   PROT 4.6* 10/21/2013   ALBUMIN 2.6* 10/21/2013   BILITOT 0.3 10/21/2013   ALKPHOS 37* 10/21/2013   AST 22 10/21/2013   ALT 24 10/21/2013  .   Total Time in preparing paper work, data evaluation and todays exam - 35 minutes  Thurnell Lose M.D on 10/23/2013 at 10:47 AM  [facility] Office  [phone]   **Disclaimer: This note may have been  dictated with voice recognition software. Similar sounding words can inadvertently be transcribed and this note may contain transcription errors which may not have been corrected upon publication of note.**

## 2013-11-20 ENCOUNTER — Encounter: Payer: Self-pay | Admitting: Surgery

## 2013-11-23 ENCOUNTER — Encounter: Payer: Self-pay | Admitting: Surgery

## 2013-11-23 ENCOUNTER — Ambulatory Visit (HOSPITAL_COMMUNITY)
Admission: RE | Admit: 2013-11-23 | Discharge: 2013-11-23 | Disposition: A | Discharge status: Home / Self Care | Payer: Medicare Other | Source: Ambulatory Visit | Attending: Surgery | Admitting: Surgery

## 2013-11-23 ENCOUNTER — Ambulatory Visit (INDEPENDENT_AMBULATORY_CARE_PROVIDER_SITE_OTHER): Payer: Medicare Other | Admitting: Surgery

## 2013-11-23 VITALS — BP 95/72 | HR 69 | Ht 64.5 in | Wt 171.0 lb

## 2013-11-23 DIAGNOSIS — Z48812 Encounter for surgical aftercare following surgery on the circulatory system: Secondary | ICD-10-CM | POA: Diagnosis present

## 2013-11-23 DIAGNOSIS — I6529 Occlusion and stenosis of unspecified carotid artery: Secondary | ICD-10-CM | POA: Diagnosis not present

## 2013-11-23 NOTE — Progress Notes (Signed)
Patient name: Caitlin Oliver MRN: [id number] DOB: [birth year] Sex: female     Chief Complaint  Patient presents with  . Re-evaluation    6 month f/u     HISTORY OF PRESENT ILLNESS: The patient is back today for followup.  She is status post staged bilateral carotid endarterectomy for asymptomatic high-grade stenosis.  She was seen by EMS and throat in between her first and second surgeries.  Her vocal cord movement was normal.  She did have a slight marginal mandibular neurapraxia after operation which has resolved.  She has no complaints today.  In the interval since I last saw her she is undergone lower extremity revascularization.  Past Medical History  Diagnosis Date  . Hypertension   . Hypercholesteremia   . Diabetes mellitus   . Anxiety   . Stomach ulcer   . Claudication   . Tobacco abuse   . Carotid artery disease   . Peripheral arterial disease     bilateral iliac artery stenosis by angiography  . COPD (chronic obstructive pulmonary disease)   . Shortness of breath     with exertion  . GERD (gastroesophageal reflux disease)     Past Surgical History  Procedure Laterality Date  . Tubal ligation    . Abdominal hysterectomy    . Breast reduction surgery    . Appendectomy    . Salivary gland surgery      scar tissue removed from left saliva glad  . Endarterectomy Right 03/06/2013    Procedure: ENDARTERECTOMY CAROTID-RIGHT;  Surgeon: Serafina Mitchell, MD;  Location: [place];  Service: Vascular;  Laterality: Right;  . Patch angioplasty Right 03/06/2013    Procedure: PATCH ANGIOPLASTY of Right Carotid Artery using Vascu-Guard Patch;  Surgeon: Serafina Mitchell, MD;  Location: [place];  Service: Vascular;  Laterality: Right;  . Endarterectomy Left 05/07/2013    Procedure: LEFT CAROTID ARTERY ENDARTERECTOMY WITH VASCU-GUARD PATCH ANGIOPLASTY ;  Surgeon: Serafina Mitchell, MD;  Location: [place];  Service: Vascular;  Laterality: Left;    History   Social History  . Marital  Status: Widowed    Spouse Name: N/A    Number of Children: N/A  . Years of Education: N/A   Occupational History  . Not on file.   Social History Main Topics  . Smoking status: Former Smoker -- 1.50 packs/day for 56 years    Types: Cigarettes  . Smokeless tobacco: Former Systems developer    Quit date: 02/25/2013     Comment: Currently uses vapor (as of 07/07/2013)  . Alcohol Use: No  . Drug Use: No  . Sexual Activity: No   Other Topics Concern  . Not on file   Social History Narrative  . No narrative on file    Family History  Problem Relation Age of Onset  . Stroke Mother   . Hypertension Mother   . Kidney failure Father     Allergies as of 11/23/2013 - Review Complete 11/23/2013  Allergen Reaction Noted  . Oxycodone Other (See Comments) 10/18/2013  . Oxycodone-acetaminophen Other (See Comments) 04/30/2013  . Estrace [estradiol] Swelling and Rash 11/19/2011    Current Outpatient Prescriptions on File Prior to Visit  Medication Sig Dispense Refill  . diazepam (VALIUM) 5 MG tablet Take 5 mg by mouth every 12 (twelve) hours. Anxiety      . diltiazem (CARTIA XT) 300 MG 24 hr capsule Take 300 mg by mouth daily.      . fenofibrate micronized (LOFIBRA)  134 MG capsule Take 134 mg by mouth daily before breakfast.      . fish oil-omega-3 fatty acids 1000 MG capsule Take 1 g by mouth 2 (two) times daily.       . metFORMIN (GLUCOPHAGE) 500 MG tablet Take 500 mg by mouth daily with breakfast.       . potassium chloride SA (K-DUR,KLOR-CON) 20 MEQ tablet Take 40 mEq by mouth daily.      . pravastatin (PRAVACHOL) 40 MG tablet Take 40 mg by mouth daily.      [name] aspirin EC 81 MG tablet Take 1 tablet (81 mg total) by mouth daily. Resume after 1 week once [facility] by PCP      . clopidogrel (PLAVIX) 75 MG tablet Take 1 tablet (75 mg total) by mouth daily with breakfast. Resume after 1 week once [place] by PCP  30 tablet  5  . metroNIDAZOLE (FLAGYL) 500 MG tablet Take 1 tablet (500 mg total) by mouth 3 (three)  times daily.  42 tablet  0  . vancomycin (VANCOCIN) 50 mg/mL oral solution 260m every 6 hrs 2 week supply, dispense as needed  20 mL  0   No current facility-administered medications on file prior to visit.     REVIEW OF SYSTEMS: Cardiovascular: No chest pain, chest pressure, palpitations, orthopnea, or dyspnea on exertion. No claudication or rest pain,  No history of DVT or phlebitis. Pulmonary: No productive cough, asthma or wheezing. Neurologic: No weakness, paresthesias, aphasia, or amaurosis. No dizziness. Hematologic: No bleeding problems or clotting disorders. Musculoskeletal: No joint pain or joint swelling. Gastrointestinal: No blood in stool or hematemesis Genitourinary: No dysuria or hematuria. Psychiatric:: No history of major depression. Integumentary: No rashes or ulcers. Constitutional: No fever or chills.  PHYSICAL EXAMINATION:   Vital signs are BP 95/72  Pulse 69  Ht 5' 4.5" (1.638 m)  Wt 171 lb (77.565 kg)  BMI 28.91 kg/m2  SpO2 99% General: The patient appears their stated age. HEENT:  No gross abnormalities Pulmonary:  Non labored breathing Abdomen: Soft and non-tender Musculoskeletal: There are no major deformities. Neurologic: No focal weakness or paresthesias are detected, Skin: There are no ulcer or rashes noted. Psychiatric: The patient has normal affect. Cardiovascular: There is a regular rate and rhythm without significant murmur appreciated.  Both carotid endarterectomy incisions are well-healed   Diagnostic Studies Outside ultrasound from April 2015 she is widely patent bilateral carotid arteries  Ultrasound was ordered and reviewed today which shows a widely patent left carotid endarterectomy  Assessment: Status post bilateral staged carotid endarterectomy for asymptomatic high-grade stenosis Plan: From my perspective the patient is doing very well.  She will need continued surveillance which will be done by Dr. BAlvester Chou  The patient will  contact me on an as-needed basis  V. WLeia Alf M.D. [facility] GSouth AmherstOffice: [phone]:  [phone]

## 2013-11-25 ENCOUNTER — Other Ambulatory Visit: Payer: Self-pay | Admitting: Gastroenterology

## 2013-11-26 ENCOUNTER — Telehealth: Payer: Self-pay | Admitting: Cardiovascular Disease

## 2013-11-26 NOTE — Telephone Encounter (Signed)
Spoke to patient. Patient states she was in the hospital 10/18/13 - GI bleed ,and received 3 units of blood.Since hospitalization has not been on ASA or Plavix. She saw Dr Amedeo Plenty yesterday, and Dr Amedeo Plenty wanted to know if patient need to restart PLAVIX and Aspirin. Per patient,Dr Amedeo Plenty would prefer her not to restart PLAVIX because she had 3 partial healed ulcers.  RN informed patient will defer to Dr Gwenlyn Found, will contact after an answer is given.Pt verbalized understanding. Patient also states she received a letter for recall office visit . Appointment made 12/23/13 at 10:30 am

## 2013-11-26 NOTE — Telephone Encounter (Signed)
Pt called wanting to know why she is on Plavix which was prescribed by Dr. Gwenlyn Found. Please call  Thanks

## 2013-11-30 ENCOUNTER — Ambulatory Visit: Payer: Medicare Other | Admitting: Pharmacist Clinician (PhC)/ Clinical Pharmacy Specialist

## 2013-11-30 NOTE — Telephone Encounter (Signed)
OK to stop plavix given GIB  JJB

## 2013-12-01 NOTE — Telephone Encounter (Signed)
Patient notified that she could stay off the Plavix for right now.

## 2013-12-23 ENCOUNTER — Ambulatory Visit (INDEPENDENT_AMBULATORY_CARE_PROVIDER_SITE_OTHER): Payer: Medicare Other | Admitting: Cardiology

## 2013-12-23 ENCOUNTER — Encounter: Payer: Self-pay | Admitting: Cardiology

## 2013-12-23 VITALS — BP 100/70 | HR 65 | Ht 64.0 in | Wt 175.8 lb

## 2013-12-23 DIAGNOSIS — K2901 Acute gastritis with bleeding: Secondary | ICD-10-CM

## 2013-12-23 DIAGNOSIS — I10 Essential (primary) hypertension: Secondary | ICD-10-CM

## 2013-12-23 DIAGNOSIS — I6529 Occlusion and stenosis of unspecified carotid artery: Secondary | ICD-10-CM

## 2013-12-23 DIAGNOSIS — E119 Type 2 diabetes mellitus without complications: Secondary | ICD-10-CM

## 2013-12-23 NOTE — Patient Instructions (Signed)
Your physician recommends that you schedule a follow-up appointment in: 3 months with Dr. Gwenlyn Found.  Your physician has recommended you make the following change in your medication: Restart aspirin 81 mg when it is okay with Dr. Amedeo Plenty.

## 2013-12-23 NOTE — Progress Notes (Signed)
12/27/2013   PCP: Tula Nakayama   Chief Complaint  Patient presents with  . 3 mo rov    patient has no complaints but has been hositalized for GI bleed since last visit..    Primary Cardiologist:Dr. Adora Fridge   HPI:  73 year old mildly overweight widowed Caucasian female no children (son died 63 years ago) referred by Emmie Niemann PA-C at [facility] for evaluation and treatment of lifestyle limiting claudication. Risk factors included hypertension, hyperlipidemia and type 2 diabetes. She has a 75-pack-year history of tobacco abuse smoking one and a half packs a day. There's no family history of heart disease. She's never had a heart attack or stroke. She does complain of dyspnea but denies chest pain. She complains of bilateral lower extremity claudication while walking up an incline which is lifestyle limiting. Dr. Adora Fridge checked dopplers 04/27/12 revealing high-grade bilateral internal carotid artery stenosis, high-grade bilateral calcified iliac artery stenosis as most common femoral artery stenoses. She had staged right followed by left carotid endarterectomy performed by Dr. Rock Nephew.  She has recovered from. Because of lifestyle limiting claudication she underwent diamondback orbital rotational atherectomy, PT A. And stenting using ICast covered stents by Dr. Adora Fridge  on 03/28/14 which resulted in complete resolution of her claudication symptoms. Her Dopplers normalized as well though she did have a high-frequency signal in the right common femoral artery.   She has been hospitalized for recent GI bleed and bronchitis.  She is followed  By Dr. Teena Irani with GI.  Her plavix and asa have been stopped.  She is here today to eval for resuming.  She has had no claudication BP is stable and is doing well today.  No chest pain or SOB.  Allergies  Allergen Reactions  . Oxycodone Other (See Comments)    shaking  . Oxycodone-Acetaminophen  Other (See Comments)    Got the Shakes, mainly in her hands  . Estrace [Estradiol] Swelling and Rash    Has allergy to patch, not the pill    Current Outpatient Prescriptions  Medication Sig Dispense Refill  . Calcium Carbonate-Vit D-Min (CALCIUM 1200 PO) Take by mouth.      . diazepam (VALIUM) 5 MG tablet Take 5 mg by mouth every 12 (twelve) hours. Anxiety      . diltiazem (CARTIA XT) 300 MG 24 hr capsule Take 300 mg by mouth daily.      . fenofibrate micronized (LOFIBRA) 134 MG capsule Take 134 mg by mouth daily before breakfast.      . fish oil-omega-3 fatty acids 1000 MG capsule Take 1 g by mouth daily.       [name] lisinopril (PRINIVIL,ZESTRIL) 20 MG tablet Take 1 tablet by mouth daily.      . metFORMIN (GLUCOPHAGE) 500 MG tablet Take 500 mg by mouth daily with breakfast.       . metoprolol tartrate (LOPRESSOR) 25 MG tablet Take 1 tablet by mouth 2 (two) times daily.      . pantoprazole (PROTONIX) 40 MG tablet Take 1 tablet by mouth daily.      . potassium chloride SA (K-DUR,KLOR-CON) 20 MEQ tablet Take 40 mEq by mouth daily.      . pravastatin (PRAVACHOL) 40 MG tablet Take 40 mg by mouth daily.      . vancomycin (VANCOCIN) 50 mg/mL oral solution 262m every 6 hrs 2 week supply, dispense as needed  20 mL  0  No current facility-administered medications for this visit.    Past Medical History  Diagnosis Date  . Hypertension   . Hypercholesteremia   . Diabetes mellitus   . Anxiety   . Stomach ulcer   . Claudication   . Tobacco abuse   . Carotid artery disease   . Peripheral arterial disease     bilateral iliac artery stenosis by angiography  . COPD (chronic obstructive pulmonary disease)   . Shortness of breath     with exertion  . GERD (gastroesophageal reflux disease)     Past Surgical History  Procedure Laterality Date  . Tubal ligation    . Abdominal hysterectomy    . Breast reduction surgery    . Appendectomy    . Salivary gland surgery      scar tissue removed  from left saliva glad  . Endarterectomy Right 03/06/2013    Procedure: ENDARTERECTOMY CAROTID-RIGHT;  Surgeon: Serafina Mitchell, MD;  Location: [facility] OR;  Service: Vascular;  Laterality: Right;  . Patch angioplasty Right 03/06/2013    Procedure: PATCH ANGIOPLASTY of Right Carotid Artery using Vascu-Guard Patch;  Surgeon: Serafina Mitchell, MD;  Location: [place];  Service: Vascular;  Laterality: Right;  . Endarterectomy Left 05/07/2013    Procedure: LEFT CAROTID ARTERY ENDARTERECTOMY WITH VASCU-GUARD PATCH ANGIOPLASTY ;  Surgeon: Serafina Mitchell, MD;  Location: [facility] OR;  Service: Vascular;  Laterality: Left;    AQL:RJPVGKK:DP colds or fevers, no weight changes Skin:no rashes or ulcers HEENT:no blurred vision, no congestion CV:see HPI PUL:see HPI GI:no diarrhea constipation or melena, no indigestion GU:no hematuria, no dysuria MS:no joint pain, no claudication Neuro:no syncope, no lightheadedness Endo:+ diabetes, no thyroid disease  Wt Readings from Last 3 Encounters:  12/23/13 175 lb 12.8 oz (79.742 kg)  11/23/13 171 lb (77.565 kg)  10/23/13 172 lb 13.5 oz (78.4 kg)    PHYSICAL EXAM BP 100/70  Pulse 65  Ht _0  (1.626 m)  Wt 175 lb 12.8 oz (79.742 kg)  BMI 30.16 kg/m2 General:Pleasant affect, NAD Skin:Warm and dry, brisk capillary refill HEENT:normocephalic, sclera clear, mucus membranes moist Neck:supple, no JVD, + carotid bruit on lt.   Heart:S1S2 RRR without murmur, gallup, rub or click Lungs:clear without rales, rhonchi, or wheezes TEL:MRAJ, non tender, + BS, do not palpate liver spleen or masses Ext:tr lower ext edema, 1+ pedal pulses, 2+ radial pulses Neuro:alert and oriented, MAE, follows commands, + facial symmetry  EKG:SR rate 65 no acute changes.  ASSESSMENT AND PLAN GI bleed Stable currently, discussed plavix with Dr. Mickeal Needy to stop Plavix but would like her back on ASA when Dr. Amedeo Plenty believes she is stable.   Occlusion and stenosis of carotid artery without  mention of cerebral infarction No dizziness no lightheadedness, hx bil CEA  Essential hypertension stable  Type 2 diabetes mellitus Stable followed by PCP     Follow up with Dr. Adora Fridge in 3 months

## 2013-12-27 ENCOUNTER — Encounter: Payer: Self-pay | Admitting: Cardiology

## 2013-12-27 NOTE — Assessment & Plan Note (Signed)
Stable followed by PCP

## 2013-12-27 NOTE — Assessment & Plan Note (Addendum)
No dizziness no lightheadedness, hx bil CEA

## 2013-12-27 NOTE — Assessment & Plan Note (Signed)
stable

## 2013-12-27 NOTE — Assessment & Plan Note (Signed)
Stable currently, discussed plavix with Dr. Mickeal Needy to stop Plavix but would like her back on ASA when Dr. Amedeo Plenty believes she is stable.

## 2014-01-28 ENCOUNTER — Encounter (HOSPITAL_COMMUNITY): Payer: Self-pay | Admitting: *Deleted

## 2014-02-16 ENCOUNTER — Encounter (HOSPITAL_COMMUNITY): Payer: Medicare Other

## 2014-02-16 ENCOUNTER — Ambulatory Visit (HOSPITAL_COMMUNITY)
Admission: RE | Admit: 2014-02-16 | Discharge: 2014-02-16 | Disposition: A | Discharge status: Home / Self Care | Payer: Medicare Other | Source: Ambulatory Visit | Attending: Cardiovascular Disease | Admitting: Cardiovascular Disease

## 2014-02-16 DIAGNOSIS — I6523 Occlusion and stenosis of bilateral carotid arteries: Secondary | ICD-10-CM

## 2014-02-16 DIAGNOSIS — I6529 Occlusion and stenosis of unspecified carotid artery: Secondary | ICD-10-CM | POA: Diagnosis not present

## 2014-02-16 NOTE — Progress Notes (Signed)
Carotid Duplex Completed. °Brianna L Mazza,RVT °

## 2014-02-24 ENCOUNTER — Encounter: Payer: Self-pay | Admitting: *Deleted

## 2014-03-18 ENCOUNTER — Ambulatory Visit (INDEPENDENT_AMBULATORY_CARE_PROVIDER_SITE_OTHER): Payer: Medicare Other | Admitting: Cardiovascular Disease

## 2014-03-18 ENCOUNTER — Encounter: Payer: Self-pay | Admitting: Cardiovascular Disease

## 2014-03-18 VITALS — BP 122/59 | HR 71 | Ht 64.0 in | Wt 173.2 lb

## 2014-03-18 DIAGNOSIS — E785 Hyperlipidemia, unspecified: Secondary | ICD-10-CM

## 2014-03-18 DIAGNOSIS — I779 Disorder of arteries and arterioles, unspecified: Secondary | ICD-10-CM

## 2014-03-18 DIAGNOSIS — I10 Essential (primary) hypertension: Secondary | ICD-10-CM

## 2014-03-18 DIAGNOSIS — J411 Mucopurulent chronic bronchitis: Secondary | ICD-10-CM

## 2014-03-18 DIAGNOSIS — I739 Peripheral vascular disease, unspecified: Secondary | ICD-10-CM

## 2014-03-18 NOTE — Assessment & Plan Note (Signed)
She has stopped smoking

## 2014-03-18 NOTE — Assessment & Plan Note (Signed)
History of hypertension with blood pressure measured today at 122/59. She is on diltiazem XT 300 mg, lisinopril 20 mg, hydrochlorothiazide 12 mg and metoprolol 25 mg. Continue current medications at current dosing

## 2014-03-18 NOTE — Assessment & Plan Note (Signed)
Status post staged bilateral carotid endarterectomies performed by Dr. Annamarie Major bound. She is neurologically asymptomatic on aspirin. We follow her by duplex ultrasound.

## 2014-03-18 NOTE — Progress Notes (Signed)
03/18/2014 Caitlin Oliver   20-Dec-1940  [id number]  Primary Physician Bing Matter, PA-C Primary Cardiologist: Lorretta Harp MD [name]   HPI:  [name] is a 73 year old mildly overweight widowed Caucasian female no children (son died 45 years ago) referred by Emmie Niemann PA-C at [facility] for evaluation and treatment of lifestyle limiting claudication. I last saw her in the office 08/28/13.Risk factors included hypertension, hyperlipidemia and type 2 diabetes. She has a 75-pack-year history of tobacco abuse smoking one and a half packs a day however she has recently quit smoking prior to her interventions.. There's no family history of heart disease. She's never had a heart for stroke. She does complain of dyspnea but denies chest pain. She complains of bilateral lower extremity claudication while walking up an incline which is lifestyle limiting. I age of Imdur 02/25/13 revealing high-grade bilateral internal carotid artery stenosis, high-grade bilateral calcified iliac artery stenosis as most common femoral artery stenoses. She had staged right followed by left carotid endarterectomy performed by Dr. Rock Nephew problem which she has recovered from. Because of lifestyle limiting claudication she underwent diamondback orbital rotational atherectomy, PT A. And stenting using ICast covered stents by myself on 07/27/13 which resulted in complete resolution of her claudication symptoms. Her Dopplers normalized as well though she did have a high-frequency signal in the right common femoral artery.since I saw her 6 months ago she has remained completely asymptomatic. She did suffer a GI bleed and is followed by Dr. Amedeo Plenty for this. Her Plavix was discontinued.  Current Outpatient Prescriptions  Medication Sig Dispense Refill  . aspirin 81 MG tablet Take 81 mg by mouth daily.    . diazepam (VALIUM) 5 MG tablet Take 5 mg by mouth every 12 (twelve) hours.  Anxiety    . diltiazem (CARTIA XT) 300 MG 24 hr capsule Take 300 mg by mouth daily.    . fenofibrate micronized (LOFIBRA) 134 MG capsule Take 134 mg by mouth daily before breakfast.    . fish oil-omega-3 fatty acids 1000 MG capsule Take 1 g by mouth daily.     . hydrochlorothiazide (HYDRODIURIL) 12.5 MG tablet   2  . HYDROcodone-acetaminophen (NORCO/VICODIN) 5-325 MG per tablet   0  . lisinopril (PRINIVIL,ZESTRIL) 20 MG tablet Take 1 tablet by mouth daily.    . metFORMIN (GLUCOPHAGE) 500 MG tablet Take 500 mg by mouth daily with breakfast.     . metoprolol tartrate (LOPRESSOR) 25 MG tablet Take 1 tablet by mouth 2 (two) times daily.    . pantoprazole (PROTONIX) 40 MG tablet Take 1 tablet by mouth daily.    . pravastatin (PRAVACHOL) 40 MG tablet Take 40 mg by mouth daily.     No current facility-administered medications for this visit.    Allergies  Allergen Reactions  . Oxycodone Other (See Comments)    shaking  . Oxycodone-Acetaminophen Other (See Comments)    Got the Shakes, mainly in her hands  . Estrace [Estradiol] Swelling and Rash    Has allergy to patch, not the pill    History   Social History  . Marital Status: Widowed    Spouse Name: N/A    Number of Children: N/A  . Years of Education: N/A   Occupational History  . Not on file.   Social History Main Topics  . Smoking status: Former Smoker -- 1.50 packs/day for 56 years    Types: Cigarettes  . Smokeless tobacco: Former Systems developer  Quit date: 02/25/2013     Comment: Currently uses vapor (as of 07/07/2013)  . Alcohol Use: No  . Drug Use: No  . Sexual Activity: No   Other Topics Concern  . Not on file   Social History Narrative     Review of Systems: General: negative for chills, fever, night sweats or weight changes.  Cardiovascular: negative for chest pain, dyspnea on exertion, edema, orthopnea, palpitations, paroxysmal nocturnal dyspnea or shortness of breath Dermatological: negative for rash Respiratory:  negative for cough or wheezing Urologic: negative for hematuria Abdominal: negative for nausea, vomiting, diarrhea, bright red blood per rectum, melena, or hematemesis Neurologic: negative for visual changes, syncope, or dizziness All other systems reviewed and are otherwise negative except as noted above.    Blood pressure 122/59, pulse 71, height _0  (1.626 m), weight 173 lb 3.2 oz (78.563 kg).  General appearance: alert and no distress Neck: no adenopathy, no JVD, supple, symmetrical, trachea midline, thyroid not enlarged, symmetric, no tenderness/mass/nodules and bilateral carotid bruits right lateral and left Lungs: clear to auscultation bilaterally Heart: regular rate and rhythm, S1, S2 normal, no murmur, click, rub or gallop Extremities: extremities normal, atraumatic, no cyanosis or edema and 2+ femorals without bruits  EKG not performed today  ASSESSMENT AND PLAN:   Essential hypertension History of hypertension with blood pressure measured today at 122/59. She is on diltiazem XT 300 mg, lisinopril 20 mg, hydrochlorothiazide 12 mg and metoprolol 25 mg. Continue current medications at current dosing  Carotid artery disease Status post staged bilateral carotid endarterectomies performed by Dr. Annamarie Major bound. She is neurologically asymptomatic on aspirin. We follow her by duplex ultrasound.  COPD She has stopped smoking  Hyperlipidemia On pravastatin 40 mg a day, followed by her primary care physician. We will obtain her most recent lab work  Claudication History of peripheral vascular disease and symptomatically medications status post bilateral calcified common iliac diamond back orbital rotational atherectomy, PTA and stenting using eye Covered stents 07/27/13. Her Dopplers improved post procedure and her claudication improved as well. She was on aspirin and Plavix but subsequently had a GI bleed and her Plavix was discontinued.      Lorretta Harp MD  FACP,FACC,FAHA, [facility] 03/18/2014 2:42 PM

## 2014-03-18 NOTE — Patient Instructions (Signed)
Your physician wants you to follow-up in: 1 year with Dr Berry. You will receive a reminder letter in the mail two months in advance. If you don't receive a letter, please call our office to schedule the follow-up appointment.  

## 2014-03-18 NOTE — Assessment & Plan Note (Signed)
History of peripheral vascular disease and symptomatically medications status post bilateral calcified common iliac diamond back orbital rotational atherectomy, PTA and stenting using eye Covered stents 07/27/13. Her Dopplers improved post procedure and her claudication improved as well. She was on aspirin and Plavix but subsequently had a GI bleed and her Plavix was discontinued.

## 2014-03-18 NOTE — Assessment & Plan Note (Signed)
On pravastatin 40 mg a day, followed by her primary care physician. We will obtain her most recent lab work

## 2014-03-25 ENCOUNTER — Encounter (HOSPITAL_COMMUNITY): Payer: Self-pay | Admitting: Cardiovascular Disease

## 2014-03-30 ENCOUNTER — Other Ambulatory Visit (HOSPITAL_COMMUNITY): Payer: Self-pay | Admitting: Cardiovascular Disease

## 2014-03-30 ENCOUNTER — Ambulatory Visit: Payer: Medicare Other | Admitting: Cardiovascular Disease

## 2014-03-30 DIAGNOSIS — I739 Peripheral vascular disease, unspecified: Secondary | ICD-10-CM

## 2014-04-20 ENCOUNTER — Ambulatory Visit (HOSPITAL_COMMUNITY)
Admission: RE | Admit: 2014-04-20 | Discharge: 2014-04-20 | Disposition: A | Discharge status: Home / Self Care | Payer: Medicare Other | Source: Ambulatory Visit | Attending: Cardiovascular Disease | Admitting: Cardiovascular Disease

## 2014-04-20 DIAGNOSIS — I739 Peripheral vascular disease, unspecified: Secondary | ICD-10-CM | POA: Diagnosis not present

## 2014-04-20 NOTE — Progress Notes (Signed)
Lower Extremity Arterial Duplex Completed. °Brianna L Mazza,RVT °

## 2014-09-01 ENCOUNTER — Other Ambulatory Visit: Payer: Self-pay | Admitting: Cardiovascular Disease

## 2014-09-01 ENCOUNTER — Ambulatory Visit (HOSPITAL_COMMUNITY)
Admission: RE | Admit: 2014-09-01 | Discharge: 2014-09-01 | Disposition: A | Discharge status: Home / Self Care | Payer: Medicare Other | Source: Ambulatory Visit | Attending: Cardiology | Admitting: Cardiology

## 2014-09-01 DIAGNOSIS — I6529 Occlusion and stenosis of unspecified carotid artery: Secondary | ICD-10-CM

## 2014-09-01 DIAGNOSIS — I6523 Occlusion and stenosis of bilateral carotid arteries: Secondary | ICD-10-CM | POA: Diagnosis not present

## 2014-09-09 ENCOUNTER — Telehealth: Payer: Self-pay | Admitting: *Deleted

## 2014-09-09 DIAGNOSIS — I6523 Occlusion and stenosis of bilateral carotid arteries: Secondary | ICD-10-CM

## 2014-09-09 NOTE — Telephone Encounter (Signed)
-----  Message from Lorretta Harp, MD sent at 09/09/2014  8:41 AM EDT ----- Increased velocities LICA (prior CEA site) compared to 12 months ago. Repeat 6 months

## 2014-09-09 NOTE — Telephone Encounter (Signed)
Patient notified of results Order placed for repeat study in 6 months

## 2014-10-19 ENCOUNTER — Encounter: Payer: Self-pay | Admitting: Cardiology

## 2014-12-17 ENCOUNTER — Other Ambulatory Visit: Payer: Self-pay | Admitting: Physician Assistant

## 2014-12-17 DIAGNOSIS — R1011 Right upper quadrant pain: Secondary | ICD-10-CM

## 2014-12-23 ENCOUNTER — Ambulatory Visit
Admission: RE | Admit: 2014-12-23 | Discharge: 2014-12-23 | Disposition: A | Discharge status: Home / Self Care | Payer: Medicare Other | Source: Ambulatory Visit | Attending: Physician Assistant | Admitting: Physician Assistant

## 2014-12-23 DIAGNOSIS — R1011 Right upper quadrant pain: Secondary | ICD-10-CM

## 2015-03-18 ENCOUNTER — Ambulatory Visit: Payer: Medicare Other | Admitting: Cardiovascular Disease

## 2015-03-22 ENCOUNTER — Ambulatory Visit (INDEPENDENT_AMBULATORY_CARE_PROVIDER_SITE_OTHER): Payer: Medicare Other | Admitting: Cardiovascular Disease

## 2015-03-22 ENCOUNTER — Encounter: Payer: Self-pay | Admitting: Cardiovascular Disease

## 2015-03-22 VITALS — BP 122/68 | HR 57 | Ht 64.0 in | Wt 181.0 lb

## 2015-03-22 DIAGNOSIS — I739 Peripheral vascular disease, unspecified: Secondary | ICD-10-CM

## 2015-03-22 DIAGNOSIS — I779 Disorder of arteries and arterioles, unspecified: Secondary | ICD-10-CM | POA: Diagnosis not present

## 2015-03-22 DIAGNOSIS — E785 Hyperlipidemia, unspecified: Secondary | ICD-10-CM | POA: Diagnosis not present

## 2015-03-22 DIAGNOSIS — I10 Essential (primary) hypertension: Secondary | ICD-10-CM

## 2015-03-22 DIAGNOSIS — I6523 Occlusion and stenosis of bilateral carotid arteries: Secondary | ICD-10-CM | POA: Diagnosis not present

## 2015-03-22 NOTE — Patient Instructions (Signed)
Medication Instructions:  Your physician recommends that you continue on your current medications as directed. Please refer to the Current Medication list given to you today.   Labwork: I will get your lab work from your Primary Care Physician.   Testing/Procedures: Your physician has requested that you have a carotid duplex. This test is an ultrasound of the carotid arteries in your neck. It looks at blood flow through these arteries that supply the brain with blood. Allow one hour for this exam. There are no restrictions or special instructions.  Your physician has requested that you have a lower extremity arterial doppler- During this test, ultrasound is used to evaluate arterial blood flow in the legs. Allow approximately one hour for this exam.    Follow-Up: Your physician wants you to follow-up in: 12 months with Dr. Gwenlyn Found. You will receive a reminder letter in the mail two months in advance. If you don't receive a letter, please call our office to schedule the follow-up appointment.   Any Other Special Instructions Will Be Listed Below (If Applicable).     If you need a refill on your cardiac medications before your next appointment, please call your pharmacy.

## 2015-03-22 NOTE — Assessment & Plan Note (Signed)
History of peripheral arterial disease status post diamondback orbital rotational atherectomy, PTA and stenting using a cast stents of her iliac arteries by myself  07/27/13 resulted in complete resolution of her claudication symptoms and normalization of her Dopplers.

## 2015-03-22 NOTE — Progress Notes (Signed)
03/22/2015 Caitlin Oliver   07/11/1940  [id number]  Primary Physician Bing Matter, PA-C Primary Cardiologist: Lorretta Harp MD [name]   HPI:  Caitlin Oliver is a 74 year old mildly overweight widowed Caucasian female no children (son died 27 years ago) referred by Emmie Niemann PA-C at [facility] at [facility] for evaluation and treatment of lifestyle limiting claudication. I last saw her in the office 03/18/14. Her .Risk factors included hypertension, hyperlipidemia and type 2 diabetes. She has a 75-pack-year history of tobacco abuse smoking one and a half packs a day however she has recently quit smoking prior to her interventions.. There's no family history of heart disease. She's never had a heart for stroke. She does complain of dyspnea but denies chest pain. She complains of bilateral lower extremity claudication while walking up an incline which is lifestyle limiting. I age of Imdur 02/25/13 revealing high-grade bilateral internal carotid artery stenosis, high-grade bilateral calcified iliac artery stenosis as most common femoral artery stenoses. She had staged right followed by left carotid endarterectomy performed by Dr. Rock Nephew problem which she has recovered from. Because of lifestyle limiting claudication she underwent diamondback orbital rotational atherectomy, PT A. And stenting using ICast covered stents by myself on 07/27/13 which resulted in complete resolution of her claudication symptoms. Her Dopplers normalized as well though she did have a high-frequency signal in the right common femoral artery. She has had a GI bleed in the past and her Plavix was discontinued. She is followed by Dr. Amedeo Plenty for this. She denies chest pain or shortness of breath. She also denies claudication.  Current Outpatient Prescriptions  Medication Sig Dispense Refill  . aspirin 81 MG tablet Take 81 mg by mouth daily.    . diazepam (VALIUM) 5 MG tablet Take 5 mg by mouth  every 12 (twelve) hours. Anxiety    . diltiazem (CARTIA XT) 300 MG 24 hr capsule Take 300 mg by mouth daily.    . fenofibrate micronized (LOFIBRA) 134 MG capsule Take 134 mg by mouth daily before breakfast.    . fish oil-omega-3 fatty acids 1000 MG capsule Take 1 g by mouth daily.     [name] gabapentin (NEURONTIN) 300 MG capsule Take 300 mg by mouth daily.    . hydrochlorothiazide (HYDRODIURIL) 12.5 MG tablet   2  . HYDROcodone-acetaminophen (NORCO/VICODIN) 5-325 MG per tablet   0  . lisinopril (PRINIVIL,ZESTRIL) 20 MG tablet Take 1 tablet by mouth daily.    . metFORMIN (GLUCOPHAGE) 500 MG tablet Take 500 mg by mouth daily with breakfast.     . metoprolol tartrate (LOPRESSOR) 25 MG tablet Take 1 tablet by mouth 2 (two) times daily.    . pantoprazole (PROTONIX) 40 MG tablet Take 2 tablets by mouth daily.     . pravastatin (PRAVACHOL) 40 MG tablet Take 40 mg by mouth daily.     No current facility-administered medications for this visit.    Allergies  Allergen Reactions  . Oxycodone Other (See Comments)    shaking  . Oxycodone-Acetaminophen Other (See Comments)    Got the Shakes, mainly in her hands  . Estrace [Estradiol] Swelling and Rash    Has allergy to patch, not the pill    Social History   Social History  . Marital Status: Widowed    Spouse Name: N/A  . Number of Children: N/A  . Years of Education: N/A   Occupational History  . Not on file.   Social History Main Topics  . Smoking  status: Former Smoker -- 1.50 packs/day for 56 years    Types: Cigarettes  . Smokeless tobacco: Former Systems developer    Quit date: 02/25/2013     Comment: Currently uses vapor (as of 07/07/2013)  . Alcohol Use: No  . Drug Use: No  . Sexual Activity: No   Other Topics Concern  . Not on file   Social History Narrative     Review of Systems: General: negative for chills, fever, night sweats or weight changes.  Cardiovascular: negative for chest pain, dyspnea on exertion, edema, orthopnea,  palpitations, paroxysmal nocturnal dyspnea or shortness of breath Dermatological: negative for rash Respiratory: negative for cough or wheezing Urologic: negative for hematuria Abdominal: negative for nausea, vomiting, diarrhea, bright red blood per rectum, melena, or hematemesis Neurologic: negative for visual changes, syncope, or dizziness All other systems reviewed and are otherwise negative except as noted above.    Blood pressure 122/68, pulse 57, height _0  (1.626 m), weight 181 lb (82.101 kg).  General appearance: alert and no distress Neck: no adenopathy, no JVD, supple, symmetrical, trachea midline, thyroid not enlarged, symmetric, no tenderness/mass/nodules and soft bilateral carotid bruits right lateral and left Lungs: clear to auscultation bilaterally Heart: regular rate and rhythm, S1, S2 normal, no murmur, click, rub or gallop Extremities: extremities normal, atraumatic, no cyanosis or edema  EKG sinus bradycardia 57 without ST or T-wave changes. I personally reviewed this EKG  ASSESSMENT AND PLAN:   PVD (peripheral vascular disease) with claudication ([place]) History of peripheral arterial disease status post diamondback orbital rotational atherectomy, PTA and stenting using a cast stents of her iliac arteries by myself  07/27/13 resulted in complete resolution of her claudication symptoms and normalization of her Dopplers.  Hyperlipidemia History of hyperlipidemia on pravastatin followed by her PCP  Essential hypertension History of hypertension blood pressure measured 122/68. She is on diltiazem, hydrochlorothiazide, metoprolol and lisinopril. Continue current meds at current dosing  Carotid artery disease History of carotid artery disease status post staged bilateral carotid endarterectomies performed by Dr. Owens Shark in the past. She had carotid Dopplers performed 09/01/14 revealing a widely patent right carotid endarterectomy site and moderate renarrowing of her left  internal carotid artery. We will continue to follow this noninvasively. She is neurologically symptomatic.      Lorretta Harp MD FACP,FACC,FAHA, [facility] 03/22/2015 12:14 PM

## 2015-03-22 NOTE — Assessment & Plan Note (Signed)
History of carotid artery disease status post staged bilateral carotid endarterectomies performed by Dr. Owens Shark in the past. She had carotid Dopplers performed 09/01/14 revealing a widely patent right carotid endarterectomy site and moderate renarrowing of her left internal carotid artery. We will continue to follow this noninvasively. She is neurologically symptomatic.

## 2015-03-22 NOTE — Assessment & Plan Note (Signed)
History of hypertension blood pressure measured 122/68. She is on diltiazem, hydrochlorothiazide, metoprolol and lisinopril. Continue current meds at current dosing

## 2015-03-22 NOTE — Assessment & Plan Note (Signed)
History of hyperlipidemia on pravastatin followed by her PCP

## 2015-04-26 ENCOUNTER — Other Ambulatory Visit: Payer: Self-pay | Admitting: Cardiovascular Disease

## 2015-04-26 DIAGNOSIS — I10 Essential (primary) hypertension: Secondary | ICD-10-CM

## 2015-04-26 DIAGNOSIS — I6523 Occlusion and stenosis of bilateral carotid arteries: Secondary | ICD-10-CM

## 2015-04-27 ENCOUNTER — Ambulatory Visit (HOSPITAL_COMMUNITY)
Admission: RE | Admit: 2015-04-27 | Discharge: 2015-04-27 | Disposition: A | Discharge status: Home / Self Care | Payer: PPO | Source: Ambulatory Visit | Attending: Cardiovascular Disease | Admitting: Cardiovascular Disease

## 2015-04-27 ENCOUNTER — Encounter (HOSPITAL_COMMUNITY): Payer: Medicare Other

## 2015-04-27 ENCOUNTER — Telehealth: Payer: Self-pay

## 2015-04-27 ENCOUNTER — Other Ambulatory Visit: Payer: Self-pay | Admitting: Cardiovascular Disease

## 2015-04-27 DIAGNOSIS — I10 Essential (primary) hypertension: Secondary | ICD-10-CM

## 2015-04-27 DIAGNOSIS — I6523 Occlusion and stenosis of bilateral carotid arteries: Secondary | ICD-10-CM

## 2015-04-27 DIAGNOSIS — I739 Peripheral vascular disease, unspecified: Secondary | ICD-10-CM | POA: Diagnosis not present

## 2015-04-27 DIAGNOSIS — I6522 Occlusion and stenosis of left carotid artery: Secondary | ICD-10-CM

## 2015-04-27 NOTE — Telephone Encounter (Signed)
-----  Message from Lorretta Harp, MD sent at 04/27/2015  3:43 PM EST ----- No change from prior study. Repeat in 12 months.

## 2015-04-28 ENCOUNTER — Telehealth: Payer: Self-pay

## 2015-04-28 DIAGNOSIS — I10 Essential (primary) hypertension: Secondary | ICD-10-CM

## 2015-04-28 NOTE — Telephone Encounter (Signed)
-----  Message from Lorretta Harp, MD sent at 04/27/2015  5:55 PM EST ----- No change from prior study. Repeat in 12 months.

## 2015-05-25 ENCOUNTER — Encounter: Payer: Self-pay | Admitting: Cardiology

## 2015-08-03 DIAGNOSIS — M2550 Pain in unspecified joint: Secondary | ICD-10-CM | POA: Diagnosis not present

## 2015-08-03 DIAGNOSIS — M79671 Pain in right foot: Secondary | ICD-10-CM | POA: Diagnosis not present

## 2015-08-03 DIAGNOSIS — F419 Anxiety disorder, unspecified: Secondary | ICD-10-CM | POA: Diagnosis not present

## 2015-08-03 DIAGNOSIS — R1084 Generalized abdominal pain: Secondary | ICD-10-CM | POA: Diagnosis not present

## 2015-08-03 DIAGNOSIS — I10 Essential (primary) hypertension: Secondary | ICD-10-CM | POA: Diagnosis not present

## 2015-08-03 DIAGNOSIS — K219 Gastro-esophageal reflux disease without esophagitis: Secondary | ICD-10-CM | POA: Diagnosis not present

## 2015-08-03 DIAGNOSIS — M79672 Pain in left foot: Secondary | ICD-10-CM | POA: Diagnosis not present

## 2015-08-03 DIAGNOSIS — E119 Type 2 diabetes mellitus without complications: Secondary | ICD-10-CM | POA: Diagnosis not present

## 2015-08-03 DIAGNOSIS — E7800 Pure hypercholesterolemia, unspecified: Secondary | ICD-10-CM | POA: Diagnosis not present

## 2015-08-08 ENCOUNTER — Encounter: Payer: Self-pay | Admitting: Podiatry

## 2015-08-08 ENCOUNTER — Ambulatory Visit (INDEPENDENT_AMBULATORY_CARE_PROVIDER_SITE_OTHER): Payer: PPO | Admitting: Podiatry

## 2015-08-08 ENCOUNTER — Telehealth: Payer: Self-pay | Admitting: *Deleted

## 2015-08-08 ENCOUNTER — Ambulatory Visit (INDEPENDENT_AMBULATORY_CARE_PROVIDER_SITE_OTHER): Payer: PPO

## 2015-08-08 VITALS — Ht 64.0 in | Wt 177.0 lb

## 2015-08-08 DIAGNOSIS — M779 Enthesopathy, unspecified: Secondary | ICD-10-CM

## 2015-08-08 DIAGNOSIS — M79673 Pain in unspecified foot: Secondary | ICD-10-CM | POA: Diagnosis not present

## 2015-08-08 DIAGNOSIS — M7741 Metatarsalgia, right foot: Secondary | ICD-10-CM

## 2015-08-08 NOTE — Telephone Encounter (Signed)
Pt states the exercises were not given for her feet.

## 2015-08-08 NOTE — Progress Notes (Signed)
Subjective:     Patient ID: Caitlin Oliver, female   DOB: 1941-03-11, 75 y.o.   MRN: [id number]  HPI 75 year old female presents the office today for concerns of bilateral foot pain which is been ongoing for proximally 6 months. She states that she only has pain when she walks on concrete floors. She wears flat shoes. She did see her primary care physician and recommended supportive shoe gear/10 she's but she has not been doing this. She denies any recent injury or trauma. No tingling or numbness to her feet. She currently denies any claudication symptoms. No recent treatment. No other complaints. No recent injury or trauma.  Review of Systems  All other systems reviewed and are negative.      Objective:   Physical Exam General: AAO x3, NAD  Dermatological: Skin is warm, dry and supple bilateral. Nails x 10 are well manicured; remaining integument appears unremarkable at this time. There are no open sores, no preulcerative lesions, no rash or signs of infection present.  Vascular: Dorsalis Pedis artery and Posterior Tibial artery pedal pulses are 2/4 bilateral with immedate capillary fill time. Pedal hair growth present. No varicosities and no lower extremity edema present bilateral. There is no pain with calf compression, swelling, warmth, erythema.   Neruologic: Grossly intact via light touch bilateral. Vibratory intact via tuning fork bilateral. Protective threshold with Semmes Wienstein monofilament intact to all pedal sites bilateral. Patellar and Achilles deep tendon reflexes 2+ bilateral. No Babinski or clonus noted bilateral.   Musculoskeletal: There is mild diffuse tenderness on the metatarsal head plantarly and the right foot. There is no pinpoint bony tenderness and no pain vibratory sensation. MPJ range of motion. On the left side there is tendinosis proximal the fifth metatarsal base on the insertion the peroneal tendon. The tendon appears to be intact. There is no pain vibratory  sensation. There is trace edema overlying this area without any erythema or increase in warmth. No other areas of tenderness bilaterally. MMT 5/5.  Gait: Unassisted, Nonantalgic.      Assessment:     75 year old female bilateral foot pain, right metatarsalgia, left peroneal tendinitis    Plan:     -Treatment options discussed including all alternatives, risks, and complications -X-rays were obtained and reviewed with the patient. No evidence of acute fracture or stress fracture. Changes to the fifth metatarsal base on the left foot likely due to chronic tendinitis -Etiology of symptoms were discussed -On the left-sided discussed steroid injection given her symptoms. She wishes to proceed knowing risks. Under sterile conditions a mixture of dexamethasone phosphate as well as local anesthetic was infiltrated around the area muscle tenderness with care not to inject likely into the tendon sheath. Plantar fascial brace was applied medial to lateral. Ice to the area daily. Discussed supportive shoe gear. -Metatarsal pad to right foot. -Follow-up 3 weeks.  Celesta Gentile, DPM

## 2015-08-08 NOTE — Patient Instructions (Signed)
.  tfc

## 2015-08-09 NOTE — Telephone Encounter (Signed)
Talked to patient yesterday afternoon and per Dr Jacqualyn Posey hold off on any exercises and relayed message to the patient. Lattie Haw

## 2015-08-29 ENCOUNTER — Ambulatory Visit (INDEPENDENT_AMBULATORY_CARE_PROVIDER_SITE_OTHER): Payer: PPO | Admitting: Podiatry

## 2015-08-29 ENCOUNTER — Encounter: Payer: Self-pay | Admitting: Podiatry

## 2015-08-29 VITALS — BP 160/85 | HR 68 | Resp 18

## 2015-08-29 DIAGNOSIS — M779 Enthesopathy, unspecified: Secondary | ICD-10-CM

## 2015-08-29 DIAGNOSIS — M7741 Metatarsalgia, right foot: Secondary | ICD-10-CM

## 2015-08-29 DIAGNOSIS — M79673 Pain in unspecified foot: Secondary | ICD-10-CM | POA: Diagnosis not present

## 2015-08-29 MED ORDER — NONFORMULARY OR COMPOUNDED ITEM: Status: DC

## 2015-08-29 NOTE — Patient Instructions (Signed)
Peroneal Tendinitis With Rehab Tendonitis is inflammation of a tendon. Inflammation of the tendons on the back of the outer ankle (peroneal tendons) is known as peroneal tendonitis. The peroneal tendons are responsible for connecting the muscles that allow you to stand on your tiptoes to the bones of the ankle. For this reason, peroneal tendonitis often causes pain when trying to complete such motions. Peroneal tendonitis often involves a tear (strain) of the peroneal tendons. Strains are classified into three categories. Grade 1 strains cause pain, but the tendon is not lengthened. Grade 2 strains include a lengthened ligament, due to the ligament being stretched or partially ruptured. With grade 2 strains there is still function, although function may be decreased. Grade 3 strains involve a complete tear of the tendon or muscle, and function is usually impaired. SYMPTOMS   Pain, tenderness, swelling, warmth, or redness over the back of the outer side of the ankle, the outer part of the mid-foot, or the bottom of the arch.  Pain that gets worse with ankle motion (especially when pushing off or pushing down with the front of the foot), or when standing on the ball of the foot or pushing the foot outward.  Crackling sound (crepitation) when the tendon is moved or touched. CAUSES  Peroneal tendinitis occurs when injury to the peroneal tendons causes the body to respond with inflammation. Common causes of injury include:  An overuse injury, in which the groove behind the outer ankle (where the tendon is located) causes wear on the tendon.  A sudden stress placed on the tendon, such as from an increase in the intensity, frequency, or duration of training.  Direct hit (trauma) to the tendon.  Return to activity too soon after a previous ankle injury. RISK INCREASES WITH:  Sports that require sudden, repetitive pushing off of the foot, such as jumping or quick starts.  Kicking and running sports,  especially running down hills or long distances.  Poor strength and flexibility.  Previous injury to the foot, ankle, or leg. PREVENTION  Warm up and stretch properly before activity.  Allow for adequate recovery between workouts.  Maintain physical fitness:  Strength, flexibility, and endurance.  Cardiovascular fitness.  Complete rehabilitation after previous injury. PROGNOSIS  If treated properly, peroneal tendonitis usually heals within 6 weeks.  RELATED COMPLICATIONS  Longer healing time, if not properly treated or if not given enough time to heal.  Recurring symptoms if activity is resumed too soon, with overuse, or when using poor technique.  If untreated, tendinitis may result in tendon rupture, requiring surgery. TREATMENT  Treatment first involves the use of ice and medicine to reduce pain and inflammation. The use of strengthening and stretching exercises may help reduce pain with activity. These exercises may be performed at unsuccessful, surgery to remove the inflamed tendon lining (sheath) may be advised.  MEDICATION   If pain medicine is needed, nonsteroidal anti-inflammatory medicines (aspirin and ibuprofen), or other minor pain relievers (acetaminophen), are often advised.  Do not take pain medicine for 7 days before surgery.  Prescription pain relievers may be given, if your caregiver thinks they are needed. Use only as directed and only as much as you need. HEAT AND COLD  Cold treatment (icing) should be applied for 10 to 15 minutes every 2 to 3 hours for inflammation and pain, and immediately after activity that aggravates your symptoms. Use ice packs or an ice massage.  Heat treatment may be used before performing stretching and strengthening activities prescribed by your  caregiver, physical therapist, or athletic trainer. Use a heat pack or a warm water soak. SEEK MEDICAL CARE IF:  Symptoms get worse or do not improve in 2 to 4 weeks, despite  treatment.  New, unexplained symptoms develop. (Drugs used in treatment may produce side effects.) EXERCISES RANGE OF MOTION (ROM) AND STRETCHING EXERCISES - Peroneal Tendinitis These exercises may help you when beginning to rehabilitate your injury. Your symptoms may resolve with or without further involvement from your physician, physical therapist or athletic trainer. While completing these exercises, remember:   Restoring tissue flexibility helps normal motion to return to the joints. This allows healthier, less painful movement and activity.  An effective stretch should be held for at least 30 seconds.  A stretch should never be painful. You should only feel a gentle lengthening or release in the stretched tissue. RANGE OF MOTION - Ankle Eversion  Sit with your right / left ankle crossed over your opposite knee.  Grip your foot with your opposite hand, placing your thumb on the top of your foot and your fingers across the bottom of your foot.  Gently push your foot downward with a slight rotation, so your littlest toes rise slightly toward the ceiling.  You should feel a gentle stretch on the inside of your ankle. Hold the stretch for __________ seconds. Repeat __________ times. Complete this exercise __________ times per day.  RANGE OF MOTION - Ankle Inversion  Sit with your right / left ankle crossed over your opposite knee.  Grip your foot with your opposite hand, placing your thumb on the bottom of your foot and your fingers across the top of your foot.  Gently pull your foot so the smallest toe comes toward you and your thumb pushes the inside of the ball of your foot away from you.  You should feel a gentle stretch on the outside of your ankle. Hold the stretch for __________ seconds. Repeat __________ times. Complete this exercise __________ times per day.  RANGE OF MOTION - Ankle Plantar Flexion  Sit with your right / left leg crossed over your opposite knee.  Use  your opposite hand to pull the top of your foot and toes toward you.  You should feel a gentle stretch on the top of your foot and ankle. Hold this position for __________ seconds. Repeat __________ times. Complete __________ times per day.  STRETCH - Gastroc, Standing  Place your hands on a wall.  Extend your right / left leg behind you, keeping the front knee somewhat bent.  Slightly point your toes inward on your back foot.  Keeping your right / left heel on the floor and your knee straight, shift your weight toward the wall, not allowing your back to arch.  You should feel a gentle stretch in the calf. Hold this position for __________ seconds. Repeat __________ times. Complete this stretch __________ times per day. STRETCH - Soleus, Standing  Place your hands on a wall.  Extend your right / left leg behind you, keeping the other knee somewhat bent.  Slightly point your toes inward on your back foot.  Keep your heel on the floor, bend your back knee, and slightly shift your weight over the back leg so that you feel a gentle stretch deep in your back calf.  Hold this position for __________ seconds. Repeat __________ times. Complete this stretch __________ times per day. STRETCH - Gastrocsoleus, Standing Note: This exercise can place a lot of stress on your foot and ankle. Please  complete this exercise only if specifically instructed by your caregiver.   Place the ball of your right / left foot on a step, keeping your other foot firmly on the same step.  Hold on to the wall or a rail for balance.  Slowly lift your other foot, allowing your body weight to press your heel down over the edge of the step.  You should feel a stretch in your right / left calf.  Hold this position for __________ seconds.  Repeat this exercise with a slight bend in your knee. Repeat __________ times. Complete this stretch __________ times per day.  STRENGTHENING EXERCISES - Peroneal Tendinitis   These exercises may help you when beginning to rehabilitate your injury. They may resolve your symptoms with or without further involvement from your physician, physical therapist or athletic trainer. While completing these exercises, remember:   Muscles can gain both the endurance and the strength needed for everyday activities through controlled exercises.  Complete these exercises as instructed by your physician, physical therapist or athletic trainer. Increase the resistance and repetitions only as guided by your caregiver. STRENGTH - Dorsiflexors  Secure a rubber exercise band or tubing to a fixed object (table, pole) and loop the other end around your right / left foot.  Sit on the floor facing the fixed object. The band should be slightly tense when your foot is relaxed.  Slowly draw your foot back toward you, using your ankle and toes.  Hold this position for __________ seconds. Slowly release the tension in the band and return your foot to the starting position. Repeat __________ times. Complete this exercise __________ times per day.  STRENGTH - Towel Curls  Sit in a chair, on a non-carpeted surface.  Place your foot on a towel, keeping your heel on the floor.  Pull the towel toward your heel only by curling your toes. Keep your heel on the floor.  If instructed by your physician, physical therapist or athletic trainer, add weight to the end of the towel. Repeat __________ times. Complete this exercise __________ times per day. STRENGTH - Ankle Eversion   Secure one end of a rubber exercise band or tubing to a fixed object (table, pole). Loop the other end around your foot, just before your toes.  Place your fists between your knees. This will focus your strengthening at your ankle.  Drawing the band across your opposite foot, away from the pole, slowly, pull your little toe out and up. Make sure the band is positioned to resist the entire motion.  Hold this position for  __________ seconds.  Have your muscles resist the band, as it slowly pulls your foot back to the starting position. Repeat __________ times. Complete this exercise __________ times per day.    This information is not intended to replace advice given to you by your health care provider. Make sure you discuss any questions you have with your health care provider.   Document Released: 04/02/2005 Document Revised: 08/17/2014 Document Reviewed: 07/15/2008 Elsevier Interactive Patient Education [employer].

## 2015-09-01 NOTE — Progress Notes (Signed)
Patient ID: Caitlin Oliver, female   DOB: Feb 07, 1941, 75 y.o.   MRN: [id number]  Subjective: 75 year old female presents the office today for follow-up of violation of bilateral foot pain. She said that she is having no pain of the right side and pain left side has improved. She's been wearing the brace. She gets very minimal discomfort the left side at this time. No recent injury or trauma. No swelling or redness. No other complaints at this time.  Objective:  General: AAO x3, NAD  Dermatological: Skin is warm, dry and supple bilateral. Nails x 10 are well manicured; remaining integument appears unremarkable at this time. There are no open sores, no preulcerative lesions, no rash or signs of infection present.  Vascular: Dorsalis Pedis artery and Posterior Tibial artery pedal pulses are 2/4 bilateral with immedate capillary fill time. Pedal hair growth present. No varicosities and no lower extremity edema present bilateral. There is no pain with calf compression, swelling, warmth, erythema.   Neruologic: Grossly intact via light touch bilateral. Vibratory intact via tuning fork bilateral. Protective threshold with Semmes Wienstein monofilament intact to all pedal sites bilateral. Patellar and Achilles deep tendon reflexes 2+ bilateral. No Babinski or clonus noted bilateral.   Musculoskeletal: There is no enderness on the metatarsal heads plantarly and the right foot. There is no pinpoint bony tenderness and no pain vibratory sensation. MPJ range of motion intact. On the left side there is no pain along the proximal aspect of the fifth metatarsal base on the insertion the peroneal tendon. The tendon appears to be intact. There is no pain vibratory sensation. There is trace edema overlying this area without any erythema or increase in warmth. No other areas of tenderness bilaterally. MMT 5/5.  Gait: Unassisted, Nonantalgic.   Assessment: 75 year old female resolving bilateral foot pain, right  metatarsalgia, left peroneal tendinitis  Plan: -Treatment options discussed including all alternatives, risks, and complications -Etiology of symptoms were discussed -Continue with offloading pads to the metatarsal heads. -At this time start range of motion, rehabilitation exercises for peroneal tendinitis which provided today. Continue brace as needed. Ice to the area. Discussed shoe gear modifications and orthotics. -Follow up in 4 recent symptoms continue or sooner if any issues are to arise.  Celesta Gentile, DPM

## 2015-10-10 ENCOUNTER — Ambulatory Visit: Payer: PPO | Admitting: Podiatry

## 2015-10-12 DIAGNOSIS — H532 Diplopia: Secondary | ICD-10-CM | POA: Diagnosis not present

## 2015-10-12 DIAGNOSIS — H538 Other visual disturbances: Secondary | ICD-10-CM | POA: Diagnosis not present

## 2015-10-12 DIAGNOSIS — R42 Dizziness and giddiness: Secondary | ICD-10-CM | POA: Diagnosis not present

## 2015-10-14 ENCOUNTER — Other Ambulatory Visit: Payer: Self-pay | Admitting: Physician Assistant

## 2015-10-14 DIAGNOSIS — H538 Other visual disturbances: Secondary | ICD-10-CM

## 2015-10-14 DIAGNOSIS — R42 Dizziness and giddiness: Secondary | ICD-10-CM

## 2015-10-14 DIAGNOSIS — H532 Diplopia: Secondary | ICD-10-CM

## 2015-10-20 ENCOUNTER — Ambulatory Visit
Admission: RE | Admit: 2015-10-20 | Discharge: 2015-10-20 | Disposition: A | Discharge status: Home / Self Care | Payer: PPO | Source: Ambulatory Visit | Attending: Physician Assistant | Admitting: Physician Assistant

## 2015-10-20 DIAGNOSIS — I6623 Occlusion and stenosis of bilateral posterior cerebral arteries: Secondary | ICD-10-CM | POA: Diagnosis not present

## 2015-10-20 DIAGNOSIS — R42 Dizziness and giddiness: Secondary | ICD-10-CM

## 2015-10-20 DIAGNOSIS — I6509 Occlusion and stenosis of unspecified vertebral artery: Secondary | ICD-10-CM | POA: Diagnosis not present

## 2015-10-20 DIAGNOSIS — H538 Other visual disturbances: Secondary | ICD-10-CM

## 2015-10-20 DIAGNOSIS — H532 Diplopia: Secondary | ICD-10-CM

## 2015-10-20 MED ORDER — IOPAMIDOL (ISOVUE-370) INJECTION 76%
100.0000 mL | Freq: Once | INTRAVENOUS | Status: AC | PRN
  Administered 2015-10-20: 100 mL via INTRAVENOUS

## 2015-10-28 ENCOUNTER — Encounter: Payer: Self-pay | Admitting: Neurology

## 2015-10-28 ENCOUNTER — Ambulatory Visit (INDEPENDENT_AMBULATORY_CARE_PROVIDER_SITE_OTHER): Payer: PPO | Admitting: Neurology

## 2015-10-28 VITALS — BP 92/56 | HR 61 | Ht 64.0 in | Wt 198.2 lb

## 2015-10-28 DIAGNOSIS — G458 Other transient cerebral ischemic attacks and related syndromes: Secondary | ICD-10-CM | POA: Diagnosis not present

## 2015-10-28 DIAGNOSIS — G450 Vertebro-basilar artery syndrome: Secondary | ICD-10-CM | POA: Diagnosis not present

## 2015-10-28 DIAGNOSIS — I771 Stricture of artery: Secondary | ICD-10-CM | POA: Insufficient documentation

## 2015-10-28 DIAGNOSIS — I6523 Occlusion and stenosis of bilateral carotid arteries: Secondary | ICD-10-CM

## 2015-10-28 DIAGNOSIS — H532 Diplopia: Secondary | ICD-10-CM | POA: Insufficient documentation

## 2015-10-28 DIAGNOSIS — G459 Transient cerebral ischemic attack, unspecified: Secondary | ICD-10-CM | POA: Insufficient documentation

## 2015-10-28 DIAGNOSIS — I708 Atherosclerosis of other arteries: Secondary | ICD-10-CM | POA: Diagnosis not present

## 2015-10-28 NOTE — Patient Instructions (Addendum)
-  will continue ASA for now. Will not add plavix now due to history of GI bleeding on plavix - continue pravastatin for stroke prevention - discontinue HCTZ and lisinopril because your BP too low. OK to continue ditiazem and metoprolol now. But check BP at home, BP goal 130-150.  - check BP at home and record and bring over to PCP for BP management. - will refer to vascular surgery to consider left subclavian artery stenting - follow up with vascular surgery and Dr. Gwenlyn Found for carotid stenosis. As this time, your symptoms likely not from carotid arteries. - be careful with neck movement. Avoid abrupt neck turning, looking up or down at this time before procedure - if you experienced again double vision, vertigo, please go to ER or call 911 - will do MRI brain to rule out stroke - Follow up with your primary care physician for stroke risk factor modification. Recommend maintain blood pressure goal <130/80, diabetes with hemoglobin A1c goal below 7.0% and lipids with LDL cholesterol goal below 70 mg/dL.  - follow up in one month

## 2015-10-28 NOTE — Progress Notes (Signed)
NEUROLOGY CLINIC NEW PATIENT NOTE  NAME: Caitlin Oliver DOB: 06/11/1940 REFERRING PHYSICIAN: [name], PA-C  I saw Caitlin Oliver as a new consult in the neurovascular clinic today regarding  Chief Complaint  Patient presents with  . Referral    Referral for Dizzy Spells, blurry vision from [place] Caitlin Oliver PAC. Pt states she has headaches, but it was not in the referral for consultation  .  HPI: Caitlin Oliver is a 75 y.o. female with PMH of HTN, HLD, PVD, and carotid stenosis s/p b/l CEAs who presents as a new patient for Episode of double vision and vertigo.   She has bilateral carotid stenosis s/p bilateral CEAs around 2014 about 6 weeks apart with Dr. Trula Slade. Followed with Dr. Gwenlyn Found and CUS on 09/01/14 showed left ICA 50-69% stenosis with left VA retrograde flow. She had recent CUS on 04/27/15 showed right ICA 1-39% stenosis but left ICA progressed to 60-79% stenosis with left VA retrograde flow.  Patient stated that on 10/12/2015 she was in the drugstore reading signs over her head, when she bent down her head she felt sudden onset double vision, and vertigo. Double vision was horizontal diplopia lasted about one hour and gradually resolved. Vertigo feels like head round and round, and she had to sit down due to imbalance, no nausea vomiting, no fall, lasted about one hour gradually resolved. After that, she continued to feel swimmy headed with headache, walking to the right side, had to hold on things on walking. Went to see PCP that day, blood pressure 140/70, recommended to go to ER, however patient refused, only agreed to do CTA head and neck. She had to use a walker for the next 2 days, and she still felt lightheaded for about 2 weeks. She had a CTA head and neck on 10/20/2015 which showed stenosis of right CCA and distal right ICA, as well as left ICA 76% stenosis. In addition, left subclavian artery significant narrowing and origin, proximal to left VA takeoff.  Consistent with left VA regional grade flow seen on CUS and therefore subclavian steal syndrome.   Patient today in clinic continues to complain of lightheadedness, posterior head headache, light sensitivity. Check blood pressure 92/56. She stated that she took BP meds this morning. She also stated that her BP is also low at home.    She has hx of HTN, HLD following with Dr. Gwenlyn Found, on pravastatin, diltiazem, HCTZ, metoprolol and lisinopirl. She also has PVD s/p diamondback orbital rotational atherectomy, PTA and stenting using a cast stents of her iliac arteries by Dr. Judithann Sauger 07/27/13 resulted in complete resolution of her claudication symptoms and normalization of her Dopplers.   She had GI bleeding in 2016 taking aspirin and the Plavix after stents placed in lower extremities. Had a GI consult, required 3 units of blood transfusion. GI workup showed bleeding ulcer in the stomach. She was told not to take any Plavix anymore. She is on aspirin 81 currently. Follow up with GI later was told ulcer was healed.   Past Medical History  Diagnosis Date  . Hypertension   . Hypercholesteremia   . Diabetes mellitus   . Anxiety   . Stomach ulcer   . Claudication ([place])   . Tobacco abuse   . Carotid artery disease ([place])   . Peripheral arterial disease ([facility])     bilateral iliac artery stenosis by angiography  . COPD (chronic obstructive pulmonary disease) ([place])   . Shortness of breath  with exertion  . GERD (gastroesophageal reflux disease)   . Dizzy    Past Surgical History  Procedure Laterality Date  . Tubal ligation    . Abdominal hysterectomy    . Breast reduction surgery    . Appendectomy    . Salivary gland surgery      scar tissue removed from left saliva glad  . Endarterectomy Right 03/06/2013    Procedure: ENDARTERECTOMY CAROTID-RIGHT;  Surgeon: Serafina Mitchell, MD;  Location: [facility];  Service: Vascular;  Laterality: Right;  . Patch angioplasty Right 03/06/2013    Procedure: PATCH  ANGIOPLASTY of Right Carotid Artery using Vascu-Guard Patch;  Surgeon: Serafina Mitchell, MD;  Location: [facility];  Service: Vascular;  Laterality: Right;  . Endarterectomy Left 05/07/2013    Procedure: LEFT CAROTID ARTERY ENDARTERECTOMY WITH VASCU-GUARD PATCH ANGIOPLASTY ;  Surgeon: Serafina Mitchell, MD;  Location: [facility];  Service: Vascular;  Laterality: Left;  . Lower extremity angiogram N/A 02/25/2013    Procedure: LOWER EXTREMITY ANGIOGRAM;  Surgeon: Lorretta Harp, MD;  Location: [facility] CATH LAB;  Service: Cardiovascular;  Laterality: N/A;  . Carotid angiogram N/A 02/25/2013    Procedure: CAROTID ANGIOGRAM;  Surgeon: Lorretta Harp, MD;  Location: [facility] CATH LAB;  Service: Cardiovascular;  Laterality: N/A;  . Lower extremity angiogram N/A 07/27/2013    Procedure: LOWER EXTREMITY ANGIOGRAM;  Surgeon: Lorretta Harp, MD;  Location: [facility] CATH LAB;  Service: Cardiovascular;  Laterality: N/A;   Family History  Problem Relation Age of Onset  . Stroke Mother   . Hypertension Mother   . Kidney failure Father    Current Outpatient Prescriptions  Medication Sig Dispense Refill  . aspirin 81 MG tablet Take 81 mg by mouth daily.    . diazepam (VALIUM) 5 MG tablet Take 5 mg by mouth every 12 (twelve) hours. Anxiety    . diltiazem (CARTIA XT) 300 MG 24 hr capsule Take 300 mg by mouth daily.    . fenofibrate micronized (LOFIBRA) 134 MG capsule Take 134 mg by mouth daily before breakfast.    . fish oil-omega-3 fatty acids 1000 MG capsule Take 1 g by mouth daily.     [name] HYDROcodone-acetaminophen (NORCO/VICODIN) 5-325 MG per tablet   0  . HYDROcodone-acetaminophen (NORCO/VICODIN) 5-325 MG tablet Take by mouth.    . metFORMIN (GLUCOPHAGE) 500 MG tablet Take 500 mg by mouth daily with breakfast.     . metoprolol tartrate (LOPRESSOR) 25 MG tablet Take 1 tablet by mouth 2 (two) times daily.    . NONFORMULARY OR COMPOUNDED ITEM [facility]:  [facility] - Baclofen 3%, Gabapentin 8%, Aripiprazole 0.5%,  Ketorolac 8%, Licocaine 5%, dispense 240grams, apply 1-2 pumps to focal pain area 3-4 times daily, rub in 1-2 minutes, +3Refills 240 each 3  . ondansetron (ZOFRAN) 4 MG tablet     . pantoprazole (PROTONIX) 40 MG tablet Take 2 tablets by mouth daily.     . pravastatin (PRAVACHOL) 40 MG tablet Take 40 mg by mouth daily.     No current facility-administered medications for this visit.   No Known Allergies Social History   Social History  . Marital Status: Widowed    Spouse Name: N/A  . Number of Children: N/A  . Years of Education: N/A   Occupational History  . Not on file.   Social History Main Topics  . Smoking status: Former Smoker -- 1.50 packs/day for 56 years    Types: Cigarettes  . Smokeless tobacco: Former  User    Quit date: 02/25/2013     Comment: Currently uses vapor (as of 07/07/2013)  . Alcohol Use: No  . Drug Use: No  . Sexual Activity: No   Other Topics Concern  . Not on file   Social History Narrative    Review of Systems Full 14 system review of systems performed and notable only for those listed, all others are neg:  Constitutional:   Cardiovascular:  Ear/Nose/Throat:   Skin:  Eyes:  Blurry vision sometimes, one-time double vision Respiratory:   Gastroitestinal:   Genitourinary:  Hematology/Lymphatic:   Endocrine:  Musculoskeletal:   Allergy/Immunology:   Neurological:  Headache, dizziness Psychiatric:  Sleep:    Physical Exam  Filed Vitals:   10/28/15 0818  BP: 92/56  Pulse: 38    General - Well nourished, well developed, in no apparent distress.  Ophthalmologic - fundi not visualized due to photophobia.  Cardiovascular - Regular rate and rhythm with no murmur.   Neck - supple, no nuchal rigidity. Limited neck ROM due to lightheadedness.  Mental Status -  Level of arousal and orientation to time, place, and person were intact. Language including expression, naming, repetition, comprehension, reading, and writing was assessed and  found intact. Fund of Knowledge was assessed and was intact.  Cranial Nerves II - XII - II - Visual field intact OU. III, IV, VI - Extraocular movements intact. V - Facial sensation intact bilaterally. VII - left nasolabial fold flattening, this is her baseline as per patient. VIII - Hearing & vestibular intact bilaterally, no nystagmus. X - Palate elevates symmetrically. XI - Chin turning & shoulder shrug intact bilaterally. XII - Tongue protrusion intact.  Motor Strength - The patient's strength was normal in all extremities and pronator drift was absent.  Bulk was normal and fasciculations were absent.   Motor Tone - Muscle tone was assessed at the neck and appendages and was normal.  Reflexes - The patient's reflexes were normal in all extremities and she had no pathological reflexes.  Sensory - Light touch, temperature/pinprick were assessed and were normal.    Coordination - The patient had normal movements in the hands with no ataxia or dysmetria.  Tremor was absent.  Gait and Station - feels lightheadedness on walking, slow with small stride, limited head and neck turning, no fall.   Imaging  I have personally reviewed the radiological images below and agree with the radiology interpretations.  CTA head and neck 1.4 cm lung mass left upper lobe worrisome for malignancy. CT of the chest recommended for further delineation.  CT HEAD Prominent small vessel disease changes without CT evidence of large acute infarct. Small or medium size infarct would be difficult to exclude given the degree white matter changes. Global atrophy without hydrocephalus.  CTA NECK 3 vessel arch with calcified plaque. Aortic atherosclerosis. Plaque with moderate to marked narrowing proximal left subclavian artery, moderate narrowing proximal innominate and proximal left common carotid artery. Moderate narrowing proximal right subclavian artery. Moderate to mark narrowing proximal right common  carotid artery. Post right carotid endarterectomy. Plaque with mild narrowing distal right common carotid artery. No significant stenosis right carotid bifurcation. Plaque distal right internal carotid artery with moderate to marked narrowing proximal to the skullbase. Moderate narrowing origin of the left common carotid artery. Plaque with mild to moderate narrowing mid to distal left common carotid artery. Plaque with 76% diameter stenosis proximal left internal carotid artery. Ectatic proximal vertebral arteries without high-grade stenosis. Several thyroid lesions measuring up  to 3.1 cm. Thyroid ultrasound recommended for further delineation.  CTA HEAD  Calcified plaque with mild to slightly mild narrowing cavernous segment internal carotid artery bilaterally. Fetal contribution to the posterior cerebral artery bilaterally. Mild narrowing distal vertebral arteries. Mild narrowing basilar artery. Mild narrowing portions of the posterior cerebral arteries bilaterally.   Lab Review 06/21/15 LDL 75 and TG 217    Assessment and Plan:   In summary, Caitlin Oliver is a 75 y.o. female with PMH of HTN, HLD, PVD s/p stents in LEs, carotid stenosis s/p b/l CEAs had episode of transient double vision and vertigo on 10/12/15 with neck movement. Continue to have lightheadedness and posterior headache. Her recent CUS indicating progressive left ICA re-stenosis and left VA retrograde flow. Recent CTA head and neck showed left ICA 75% stenosis, right ICA distal stenosis, and left subclavian artery origin severe stenosis. Taken together, it is consistent with subclavian steal syndrome. In addition, patient has worsening symptoms with neck moment, need to consider bow hunter syndrome. Patient BP low at home, today 92/56, likely BP medication related. She needs higher BP goal for any great cerebral perfusion due to multiple vessel stenosis. Will discontinue lisinopril and HCTZ. Need urgent VVS consultation for  consider left subclavian artery stenting. Current symptoms seem not to related with carotid stenosis, however, ICA stenosis need to be closely monitored. We will do MRI brain for stroke evaluation.  - will continue ASA for now. Will not add plavix now due to history of GI bleeding on plavix - continue pravastatin for stroke prevention - discontinue HCTZ and lisinopril because your BP too low. OK to continue ditiazem and metoprolol now. But check BP at home, BP goal 130-150.  - check BP at home and record and bring over to PCP for BP management. - will refer to vascular surgery to consider left subclavian artery stenting - follow up with vascular surgery and Dr. Gwenlyn Found for carotid stenosis. As this time, your symptoms likely not from carotid arteries. - be careful with neck movement. Avoid abrupt neck turning at this time before procedure - if you experienced again double vision, vertigo, please go to ER or call 911 - will do MRI brain to rule out stroke - Follow up with your primary care physician for stroke risk factor modification. Recommend maintain blood pressure goal 130-150/80, diabetes with hemoglobin A1c goal below 7.0% and lipids with LDL cholesterol goal below 70 mg/dL.  - follow up in one month  I recommend aggressive blood pressure control with a goal 130-150/80 mm Hg.  Lipids should be managed intensively, with a goal LDL < 70 mg/dL.  I encouraged the patient to discuss these important issues with her primary care physician.  I counseled the patient on measures to reduce stroke risk, including the importance of medication compliance, risk factor control, exercise, healthy diet, and avoidance of smoking.  I reviewed stroke warning signs and symptoms and appropriate actions to take if such occurs.   Thank you very much for the opportunity to participate in the care of this patient.  Please do not hesitate to call if any questions or concerns arise.  Orders Placed This Encounter    Procedures  . MR Brain Wo Contrast    Standing Status: Future     Number of Occurrences:      Standing Expiration Date: 12/29/2016    Order Specific Question:  Reason for Exam (SYMPTOM  OR DIAGNOSIS REQUIRED)    Answer:  stroke evaluation    Order  Specific Question:  Preferred imaging location?    Answer:  Internal    Order Specific Question:  Does the patient have a pacemaker or implanted devices?    Answer:  Yes    Order Specific Question:  Manufacturer of pacemake or implanted device?    Answer:  stents in legs put in 2015 by Dr. Gwenlyn Found    Order Specific Question:  What is the patient's sedation requirement?    Answer:  No Sedation  . Ambulatory referral to Vascular Surgery    Referral Priority:  Urgent    Referral Type:  Surgical    Referral Reason:  Specialty Services Required    Referred to Provider:  Serafina Mitchell, MD    Requested Specialty:  Vascular Surgery    Number of Visits Requested:  1    Meds ordered this encounter  Medications  . HYDROcodone-acetaminophen (NORCO/VICODIN) 5-325 MG tablet    Sig: Take by mouth.  . ondansetron (ZOFRAN) 4 MG tablet    Sig:   . DISCONTD: lidocaine (XYLOCAINE) 5 % ointment    Sig: Reported on 10/28/2015    Patient Instructions  - will continue ASA for now. Will not add plavix now due to history of GI bleeding on plavix - continue pravastatin for stroke prevention - discontinue HCTZ and lisinopril because your BP too low. OK to continue ditiazem and metoprolol now. But check BP at home, BP goal 130-150.  - check BP at home and record and bring over to PCP for BP management. - will refer to vascular surgery to consider left subclavian artery stenting - follow up with vascular surgery and Dr. Gwenlyn Found for carotid stenosis. As this time, your symptoms likely not from carotid arteries. - be careful with neck movement. Avoid abrupt neck turning, looking up or down at this time before procedure - if you experienced again double vision,  vertigo, please go to ER or call 911 - will do MRI brain to rule out stroke - Follow up with your primary care physician for stroke risk factor modification. Recommend maintain blood pressure goal <130/80, diabetes with hemoglobin A1c goal below 7.0% and lipids with LDL cholesterol goal below 70 mg/dL.  - follow up in one month    Rosalin Hawking, MD PhD [facility] [address], [place],  [address] [phone]

## 2015-10-31 ENCOUNTER — Telehealth: Payer: Self-pay | Admitting: Neurology

## 2015-10-31 DIAGNOSIS — R918 Other nonspecific abnormal finding of lung field: Secondary | ICD-10-CM | POA: Diagnosis not present

## 2015-10-31 DIAGNOSIS — I6529 Occlusion and stenosis of unspecified carotid artery: Secondary | ICD-10-CM | POA: Diagnosis not present

## 2015-10-31 DIAGNOSIS — R51 Headache: Secondary | ICD-10-CM | POA: Diagnosis not present

## 2015-10-31 DIAGNOSIS — M2550 Pain in unspecified joint: Secondary | ICD-10-CM | POA: Diagnosis not present

## 2015-10-31 DIAGNOSIS — H6122 Impacted cerumen, left ear: Secondary | ICD-10-CM | POA: Diagnosis not present

## 2015-10-31 NOTE — Telephone Encounter (Signed)
[facility] made to chart.

## 2015-10-31 NOTE — Telephone Encounter (Signed)
Pt called to advise the mail order pharmacy is [facility] [phone]

## 2015-11-01 ENCOUNTER — Other Ambulatory Visit: Payer: Self-pay | Admitting: Physician Assistant

## 2015-11-01 ENCOUNTER — Other Ambulatory Visit: Payer: Self-pay

## 2015-11-01 ENCOUNTER — Telehealth: Payer: Self-pay

## 2015-11-01 DIAGNOSIS — I6523 Occlusion and stenosis of bilateral carotid arteries: Secondary | ICD-10-CM

## 2015-11-01 DIAGNOSIS — H532 Diplopia: Secondary | ICD-10-CM

## 2015-11-01 DIAGNOSIS — R42 Dizziness and giddiness: Secondary | ICD-10-CM

## 2015-11-01 DIAGNOSIS — R918 Other nonspecific abnormal finding of lung field: Secondary | ICD-10-CM

## 2015-11-01 NOTE — Telephone Encounter (Signed)
Phone call from pt's niece.  Reported pt. Has been referred by Dr. Erlinda Hong to see Dr. Trula Slade, and the appt. Isn't until 8/23.  Stated the pt. Had an episode of double vision and extreme dizziness on 10/12/15, that lasted for over 1 hour.  Stated she saw Neurology, and was told she has another blockage.  Stated last evening, she had difficulty walking from the counter to the kitchen table. Niece voiced concern that the pt. needs to be evaluated much sooner than is scheduled.  Advised that will have a scheduler contact her, with earlier appt. option.  Agreed.

## 2015-11-01 NOTE — Telephone Encounter (Signed)
Called niece [name] and added patient onto schedule for 11/02/15 at 10 am for a carotid US and to see NP at 11:15am.

## 2015-11-02 ENCOUNTER — Encounter: Payer: Self-pay | Admitting: Family

## 2015-11-02 ENCOUNTER — Ambulatory Visit (INDEPENDENT_AMBULATORY_CARE_PROVIDER_SITE_OTHER): Payer: PPO | Admitting: Family

## 2015-11-02 ENCOUNTER — Ambulatory Visit (HOSPITAL_COMMUNITY)
Admission: RE | Admit: 2015-11-02 | Discharge: 2015-11-02 | Disposition: A | Discharge status: Home / Self Care | Payer: PPO | Source: Ambulatory Visit | Attending: Surgery | Admitting: Surgery

## 2015-11-02 VITALS — BP 88/63 | HR 60 | Temp 97.2°F | Resp 16 | Ht 64.0 in | Wt 182.0 lb

## 2015-11-02 DIAGNOSIS — Z9889 Other specified postprocedural states: Secondary | ICD-10-CM | POA: Diagnosis not present

## 2015-11-02 DIAGNOSIS — E7800 Pure hypercholesterolemia, unspecified: Secondary | ICD-10-CM | POA: Diagnosis not present

## 2015-11-02 DIAGNOSIS — I771 Stricture of artery: Secondary | ICD-10-CM

## 2015-11-02 DIAGNOSIS — I708 Atherosclerosis of other arteries: Secondary | ICD-10-CM

## 2015-11-02 DIAGNOSIS — H532 Diplopia: Secondary | ICD-10-CM | POA: Diagnosis not present

## 2015-11-02 DIAGNOSIS — I10 Essential (primary) hypertension: Secondary | ICD-10-CM | POA: Diagnosis not present

## 2015-11-02 DIAGNOSIS — R42 Dizziness and giddiness: Secondary | ICD-10-CM | POA: Diagnosis not present

## 2015-11-02 DIAGNOSIS — K219 Gastro-esophageal reflux disease without esophagitis: Secondary | ICD-10-CM | POA: Diagnosis not present

## 2015-11-02 DIAGNOSIS — F419 Anxiety disorder, unspecified: Secondary | ICD-10-CM | POA: Diagnosis not present

## 2015-11-02 DIAGNOSIS — I6523 Occlusion and stenosis of bilateral carotid arteries: Secondary | ICD-10-CM

## 2015-11-02 DIAGNOSIS — Z720 Tobacco use: Secondary | ICD-10-CM | POA: Insufficient documentation

## 2015-11-02 DIAGNOSIS — J449 Chronic obstructive pulmonary disease, unspecified: Secondary | ICD-10-CM | POA: Diagnosis not present

## 2015-11-02 DIAGNOSIS — G458 Other transient cerebral ischemic attacks and related syndromes: Secondary | ICD-10-CM | POA: Insufficient documentation

## 2015-11-02 DIAGNOSIS — E119 Type 2 diabetes mellitus without complications: Secondary | ICD-10-CM | POA: Diagnosis not present

## 2015-11-02 DIAGNOSIS — Z87891 Personal history of nicotine dependence: Secondary | ICD-10-CM | POA: Diagnosis not present

## 2015-11-02 NOTE — Progress Notes (Signed)
Chief Complaint: Follow up Extracranial Carotid Artery Stenosis   History of Present Illness  Caitlin Oliver is a 75 y.o. female patient of Dr. Trula Slade who is status post staged bilateral carotid endarterectomy for asymptomatic high-grade stenosis: left CEA 05/07/13, right CEA 02/15/13. She was seen by ENT between her first and second surgeries. Her vocal cord movement was normal. She did have a slight marginal mandibular neurapraxia after operation which has resolved.  She is undergone lower extremity revascularization in April 2015 by Dr. Gwenlyn Found.  Pt returns today at the request of Dr. Erlinda Hong. Phone call received from niece on 11/01/15. Pt sas been referred by Dr. Erlinda Hong to see Dr. Trula Slade, and the appt is not until 8/23. Stated the pt had an episode of double vision and extreme dizziness on 10/12/15, that lasted for over 1 hour. Stated she saw Neurology, and was told she has another blockage. Stated last evening (10/31/15), she had difficulty walking from the counter to the kitchen table. Niece voiced concern that the pt. needs to be evaluated much sooner than is scheduled.  Pt had a CT of head and neck on 10/20/15.  Pt states Dr. Erlinda Hong wants her to have an MRI of her head in addition to the CT already performed.   Dr. Trula Slade last saw pt on 11/23/13. At that time pt was status post bilateral staged carotid endarterectomy for asymptomatic high-grade stenosis From Dr. Trula Slade perspective the patient is doing very well. She will need continued surveillance which will be done by Dr. Gwenlyn Found. The patient will contact Dr. Trula Slade on an as-needed basis.  Pt denies monocular loss of vision, denies lateralizing weakness or hemiplegia, denies speech difficulties. She denies any known history of stroke or TIA until possibly June of 2017. Pt also states that the left side of her face has drooped for years, that this is not new. She states her speech seems normal to her. She is using a walker for balance as she  feels off balance. She does report concentration and short term memory issues since October 12, 2015. She states she saw a doctor at [facility], states she was advised to be evaluated in the ED but declined.   She states both hands tingle, especially when driving, left hand more so than right, but this resolves for a while.   Pt Diabetic: yes, states her last A1C was 6.? Pt smoker: former smoker, quit in February 2015, smoked for 56 years  Pt meds include: Statin : yes ASA: yes Other anticoagulants/antiplatelets: no, July 2015 she had a GI bleed and the Plavix was stopped   Past Medical History  Diagnosis Date  . Hypertension   . Hypercholesteremia   . Diabetes mellitus   . Anxiety   . Stomach ulcer   . Claudication ([place])   . Tobacco abuse   . Carotid artery disease ([place])   . Peripheral arterial disease (HCC)     bilateral iliac artery stenosis by angiography  . COPD (chronic obstructive pulmonary disease) ([place])   . Shortness of breath     with exertion  . GERD (gastroesophageal reflux disease)   . Dizzy     Social History Social History  Substance Use Topics  . Smoking status: Former Smoker -- 1.50 packs/day for 56 years    Types: Cigarettes  . Smokeless tobacco: Former Systems developer    Quit date: 02/25/2013     Comment: Currently uses vapor (as of 07/07/2013)  . Alcohol Use: No    Family History Family  History  Problem Relation Age of Onset  . Stroke Mother   . Hypertension Mother   . Kidney failure Father     Surgical History Past Surgical History  Procedure Laterality Date  . Tubal ligation    . Abdominal hysterectomy    . Breast reduction surgery    . Appendectomy    . Salivary gland surgery      scar tissue removed from left saliva glad  . Endarterectomy Right 03/06/2013    Procedure: ENDARTERECTOMY CAROTID-RIGHT;  Surgeon: Serafina Mitchell, MD;  Location: [facility];  Service: Vascular;  Laterality: Right;  . Patch angioplasty Right 03/06/2013    Procedure: PATCH  ANGIOPLASTY of Right Carotid Artery using Vascu-Guard Patch;  Surgeon: Serafina Mitchell, MD;  Location: [facility];  Service: Vascular;  Laterality: Right;  . Endarterectomy Left 05/07/2013    Procedure: LEFT CAROTID ARTERY ENDARTERECTOMY WITH VASCU-GUARD PATCH ANGIOPLASTY ;  Surgeon: Serafina Mitchell, MD;  Location: [facility];  Service: Vascular;  Laterality: Left;  . Lower extremity angiogram N/A 02/25/2013    Procedure: LOWER EXTREMITY ANGIOGRAM;  Surgeon: Lorretta Harp, MD;  Location: [facility] CATH LAB;  Service: Cardiovascular;  Laterality: N/A;  . Carotid angiogram N/A 02/25/2013    Procedure: CAROTID ANGIOGRAM;  Surgeon: Lorretta Harp, MD;  Location: [facility] CATH LAB;  Service: Cardiovascular;  Laterality: N/A;  . Lower extremity angiogram N/A 07/27/2013    Procedure: LOWER EXTREMITY ANGIOGRAM;  Surgeon: Lorretta Harp, MD;  Location: [facility] CATH LAB;  Service: Cardiovascular;  Laterality: N/A;    No Known Allergies  Current Outpatient Prescriptions  Medication Sig Dispense Refill  . aspirin 81 MG tablet Take 81 mg by mouth daily.    . diazepam (VALIUM) 5 MG tablet Take 5 mg by mouth every 12 (twelve) hours. Anxiety    . diltiazem (CARTIA XT) 300 MG 24 hr capsule Take 300 mg by mouth daily.    . fenofibrate micronized (LOFIBRA) 134 MG capsule Take 134 mg by mouth daily before breakfast.    . fish oil-omega-3 fatty acids 1000 MG capsule Take 1 g by mouth daily.     [name] HYDROcodone-acetaminophen (NORCO/VICODIN) 5-325 MG tablet Take by mouth.    . metFORMIN (GLUCOPHAGE) 500 MG tablet Take 500 mg by mouth daily with breakfast.     . metoprolol tartrate (LOPRESSOR) 25 MG tablet Take 1 tablet by mouth 2 (two) times daily.    . NONFORMULARY OR COMPOUNDED ITEM [facility] pharmacy:  Combo Pain #1 - Baclofen 3%, Gabapentin 8%, Aripiprazole 3.2%, Ketorolac 8%, Licocaine 5%, dispense 240grams, apply 1-2 pumps to focal pain area 3-4 times daily, rub in 1-2 minutes, +3Refills 240 each 3  . ondansetron (ZOFRAN) 4  MG tablet     . pantoprazole (PROTONIX) 40 MG tablet Take 2 tablets by mouth daily.     . pravastatin (PRAVACHOL) 40 MG tablet Take 40 mg by mouth daily.     No current facility-administered medications for this visit.    Review of Systems : See HPI for pertinent positives and negatives.  Physical Examination  Filed Vitals:   11/02/15 1115 11/02/15 1117  BP: 136/78 88/63  Pulse: 64 60  Temp: 97.2 F (36.2 C)   TempSrc: Oral   Resp: 16   Height: _0  (1.626 m)   Weight: 182 lb (82.555 kg)   SpO2: 98%    Body mass index is 31.22 kg/(m^2).  General: WDWN obese female in NAD GAIT: mildly ataxic, using walker Eyes: PERRLA, right  eye has mildly asymmetric lateral gaze if not focusing on something. Pulmonary:  Respirations are non-labored, CTAB, good air movement.  Cardiac: regular rhythm, no detected murmur.  VASCULAR EXAM Carotid Bruits Right Left   positive negative    Aorta is not palpable. Radial pulses: right is 2+, left is 1+ palpable                                                                                                                           LE Pulses Right Left       POPLITEAL  not palpable   not palpable       POSTERIOR TIBIAL  2+ palpable   2+ palpable        DORSALIS PEDIS      ANTERIOR TIBIAL 2+ palpable  2+ palpable     Gastrointestinal: soft, nontender, BS WNL, no r/g, no palpable masses.  Musculoskeletal: no muscle atrophy/wasting. M/S 5/5 throughout, extremities without ischemic changes.  Neurologic: A&O X 3; Appropriate Affect, Speech is slightly slurred, CN 2-12 intact except moderate left side facial droop, pain and light touch intact in extremities, Motor exam as listed above.    Assessment: Caitlin Oliver is a 75 y.o. female who is status post staged bilateral carotid endarterectomy for asymptomatic high-grade stenosis: left CEA 05/07/13, right CEA 02/15/13. She returns today at the request of Dr. Erlinda Hong. Her appointment was moved up  from 8/23 due to sx's experienced on 10/12/15 and 10/31/15, see HPI.  Carotid duplex today indicates less than 40% right ICA stenosis and 60-79% left ICA stenosis. Evidence of left subclavian steal with >40 mm Hg difference in brachial systolic pressures.   She has left side facial droop which she states she has had for years. She walks with a walker to steady herself due to recent onset of severe dizziness and ataxia.  I discussed with Dr. Scot Dock pt HPI, carotid duplex results from today, and CTA neck results from 10/20/15, see Plan.  Face to face time with patient was 25 minutes. Over 50% of this time was spent on counseling and coordination of care.   Plan:  Follow up with Dr. Trula Slade next week to discuss pt sx's, carotid duplex results, CTA neck results.  I discussed in depth with the patient the nature of atherosclerosis, and emphasized the importance of maximal medical management including strict control of blood pressure, blood glucose, and lipid levels, obtaining regular exercise, and continued cessation of smoking.  The patient is aware that without maximal medical management the underlying atherosclerotic disease process will progress, limiting the benefit of any interventions. The patient was given information about stroke prevention and what symptoms should prompt the patient to seek immediate medical care. Thank you for allowing [place] to participate in this patient's care.  Clemon Chambers, RN, MSN, FNP-C [facility] [place] Office: [phone]  Clinic Physician: Scot Dock  11/02/2015 11:46 AM

## 2015-11-02 NOTE — Patient Instructions (Signed)
Stroke Prevention Some medical conditions and behaviors are associated with an increased chance of having a stroke. You may prevent a stroke by making healthy choices and managing medical conditions. HOW CAN I REDUCE MY RISK OF HAVING A STROKE?   Stay physically active. Get at least 30 minutes of activity on most or all days.  Do not smoke. It may also be helpful to avoid exposure to secondhand smoke.  Limit alcohol use. Moderate alcohol use is considered to be:  No more than 2 drinks per day for men.  No more than 1 drink per day for nonpregnant women.  Eat healthy foods. This involves:  Eating 5 or more servings of fruits and vegetables a day.  Making dietary changes that address high blood pressure (hypertension), high cholesterol, diabetes, or obesity.  Manage your cholesterol levels.  Making food choices that are high in fiber and low in saturated fat, trans fat, and cholesterol may control cholesterol levels.  Take any prescribed medicines to control cholesterol as directed by your health care provider.  Manage your diabetes.  Controlling your carbohydrate and sugar intake is recommended to manage diabetes.  Take any prescribed medicines to control diabetes as directed by your health care provider.  Control your hypertension.  Making food choices that are low in salt (sodium), saturated fat, trans fat, and cholesterol is recommended to manage hypertension.  Ask your health care provider if you need treatment to lower your blood pressure. Take any prescribed medicines to control hypertension as directed by your health care provider.  If you are 18-39 years of age, have your blood pressure checked every 3-5 years. If you are 40 years of age or older, have your blood pressure checked every year.  Maintain a healthy weight.  Reducing calorie intake and making food choices that are low in sodium, saturated fat, trans fat, and cholesterol are recommended to manage  weight.  Stop drug abuse.  Avoid taking birth control pills.  Talk to your health care provider about the risks of taking birth control pills if you are over 35 years old, smoke, get migraines, or have ever had a blood clot.  Get evaluated for sleep disorders (sleep apnea).  Talk to your health care provider about getting a sleep evaluation if you snore a lot or have excessive sleepiness.  Take medicines only as directed by your health care provider.  For some people, aspirin or blood thinners (anticoagulants) are helpful in reducing the risk of forming abnormal blood clots that can lead to stroke. If you have the irregular heart rhythm of atrial fibrillation, you should be on a blood thinner unless there is a good reason you cannot take them.  Understand all your medicine instructions.  Make sure that other conditions (such as anemia or atherosclerosis) are addressed. SEEK IMMEDIATE MEDICAL CARE IF:   You have sudden weakness or numbness of the face, arm, or leg, especially on one side of the body.  Your face or eyelid droops to one side.  You have sudden confusion.  You have trouble speaking (aphasia) or understanding.  You have sudden trouble seeing in one or both eyes.  You have sudden trouble walking.  You have dizziness.  You have a loss of balance or coordination.  You have a sudden, severe headache with no known cause.  You have new chest pain or an irregular heartbeat. Any of these symptoms may represent a serious problem that is an emergency. Do not wait to see if the symptoms will   go away. Get medical help at once. Call your local emergency services (911 in [place] Do not drive yourself to the hospital.   This information is not intended to replace advice given to you by your health care provider. Make sure you discuss any questions you have with your health care provider.   Document Released: 05/10/2004 Document Revised: 04/23/2014 Document Reviewed:  10/03/2012 Elsevier Interactive Patient Education [employer].

## 2015-11-03 ENCOUNTER — Encounter: Payer: Self-pay | Admitting: Surgery

## 2015-11-07 ENCOUNTER — Encounter: Payer: Self-pay | Admitting: Surgery

## 2015-11-07 ENCOUNTER — Ambulatory Visit (INDEPENDENT_AMBULATORY_CARE_PROVIDER_SITE_OTHER): Payer: PPO | Admitting: Surgery

## 2015-11-07 ENCOUNTER — Ambulatory Visit
Admission: RE | Admit: 2015-11-07 | Discharge: 2015-11-07 | Disposition: A | Discharge status: Home / Self Care | Payer: PPO | Source: Ambulatory Visit | Attending: Physician Assistant | Admitting: Physician Assistant

## 2015-11-07 VITALS — BP 116/65 | HR 68 | Temp 97.2°F | Resp 16 | Ht 64.0 in | Wt 183.0 lb

## 2015-11-07 DIAGNOSIS — Z9889 Other specified postprocedural states: Secondary | ICD-10-CM | POA: Diagnosis not present

## 2015-11-07 DIAGNOSIS — I708 Atherosclerosis of other arteries: Secondary | ICD-10-CM

## 2015-11-07 DIAGNOSIS — I6523 Occlusion and stenosis of bilateral carotid arteries: Secondary | ICD-10-CM

## 2015-11-07 DIAGNOSIS — I771 Stricture of artery: Secondary | ICD-10-CM

## 2015-11-07 DIAGNOSIS — R918 Other nonspecific abnormal finding of lung field: Secondary | ICD-10-CM

## 2015-11-07 DIAGNOSIS — R911 Solitary pulmonary nodule: Secondary | ICD-10-CM | POA: Diagnosis not present

## 2015-11-07 MED ORDER — IOPAMIDOL (ISOVUE-300) INJECTION 61%
75.0000 mL | Freq: Once | INTRAVENOUS | Status: AC | PRN
  Administered 2015-11-07: 75 mL via INTRAVENOUS

## 2015-11-07 NOTE — Progress Notes (Signed)
Referring Physician: [name], PA-C  Patient name: Caitlin Oliver MRN: [id number] DOB: 1941-03-14 Sex: female  REASON FOR CONSULT: Carotid re stenosis and left  subclavian stenosis with symptoms of dizziness.  HPI: Caitlin Oliver is a 75 y.o. female Pt returns today at the request of Dr. Erlinda Hong. Phone call received from niece on 11/01/15. Pt sas been referred by Dr. Erlinda Hong to see Dr. Trula Slade, and the appt is not until 8/23. Stated the pt had an episode of double vision and extreme dizziness on 10/12/15, that lasted for over 1 hour. Stated she saw Neurology, and was told she has another blockage. Stated last evening (10/31/15), she had difficulty walking from the counter to the kitchen table. Niece voiced concern that the pt. needs to be evaluated much sooner than is scheduled.  Her dizziness is subsiding and she reports no weakness in her extremities.    Past medical history bilateral carotid endarterectomy for asymptomatic high-grade stenosis: left CEA 05/07/13, right CEA 02/15/13. Her past medical history also includes HTN managed with lopressor, hyperlipidemia managed with pravastatin and DM managed with PO metformin.         Past Medical History:  Diagnosis Date  . Anxiety   . Carotid artery disease ([place])   . Claudication ([place])   . COPD (chronic obstructive pulmonary disease) ([place])   . Diabetes mellitus   . Dizzy   . GERD (gastroesophageal reflux disease)   . Hypercholesteremia   . Hypertension   . 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 Shortness of breath    with exertion  . Stomach ulcer   . Tobacco abuse    Past Surgical History:  Procedure Laterality Date  . ABDOMINAL HYSTERECTOMY    . APPENDECTOMY    . BREAST REDUCTION SURGERY    . CAROTID ANGIOGRAM N/A 02/25/2013   Procedure: CAROTID ANGIOGRAM;  Surgeon: Lorretta Harp, MD;  Location: [facility] CATH LAB;  Service: Cardiovascular;  Laterality: N/A;  . ENDARTERECTOMY  Right 03/06/2013   Procedure: ENDARTERECTOMY CAROTID-RIGHT;  Surgeon: Serafina Mitchell, MD;  Location: [place];  Service: Vascular;  Laterality: Right;  . ENDARTERECTOMY Left 05/07/2013   Procedure: LEFT CAROTID ARTERY ENDARTERECTOMY WITH VASCU-GUARD PATCH ANGIOPLASTY ;  Surgeon: Serafina Mitchell, MD;  Location: [place];  Service: Vascular;  Laterality: Left;  . LOWER EXTREMITY ANGIOGRAM N/A 02/25/2013   Procedure: LOWER EXTREMITY ANGIOGRAM;  Surgeon: Lorretta Harp, MD;  Location: [facility] CATH LAB;  Service: Cardiovascular;  Laterality: N/A;  . LOWER EXTREMITY ANGIOGRAM N/A 07/27/2013   Procedure: LOWER EXTREMITY ANGIOGRAM;  Surgeon: Lorretta Harp, MD;  Location: [facility] CATH LAB;  Service: Cardiovascular;  Laterality: N/A;  . PATCH ANGIOPLASTY Right 03/06/2013   Procedure: PATCH ANGIOPLASTY of Right Carotid Artery using Vascu-Guard Patch;  Surgeon: Serafina Mitchell, MD;  Location: [place];  Service: Vascular;  Laterality: Right;  . SALIVARY GLAND SURGERY     scar tissue removed from left saliva glad  . TUBAL LIGATION      Family History  Problem Relation Age of Onset  . Stroke Mother   . Hypertension Mother   . Kidney failure Father     SOCIAL HISTORY: Social History   Social History  . Marital status: Widowed    Spouse name: N/A  . Number of children: N/A  . Years of education: N/A   Occupational History  . Not on file.   Social History Main Topics  .  Smoking status: Former Smoker    Packs/day: 1.50    Years: 56.00    Types: Cigarettes    Quit date: 07/07/2013  . Smokeless tobacco: Former Systems developer    Quit date: 02/25/2013  . Alcohol use No  . Drug use: No  . Sexual activity: No   Other Topics Concern  . Not on file   Social History Narrative  . No narrative on file    No Known Allergies  Current Outpatient Prescriptions  Medication Sig Dispense Refill  . aspirin 81 MG tablet Take 81 mg by mouth daily.    . Biotin (BIOTIN 5000) 5 MG CAPS Take by mouth.    . diazepam (VALIUM) 5  MG tablet Take 5 mg by mouth every 12 (twelve) hours. Anxiety    . diltiazem (CARTIA XT) 300 MG 24 hr capsule Take 300 mg by mouth daily.    . fenofibrate micronized (LOFIBRA) 134 MG capsule Take 134 mg by mouth daily before breakfast.    . fish oil-omega-3 fatty acids 1000 MG capsule Take 1 g by mouth daily.     [name] HYDROcodone-acetaminophen (NORCO/VICODIN) 5-325 MG tablet Take by mouth.    . metFORMIN (GLUCOPHAGE) 500 MG tablet Take 500 mg by mouth daily with breakfast.     . metoprolol tartrate (LOPRESSOR) 25 MG tablet Take 1 tablet by mouth 2 (two) times daily.    . NONFORMULARY OR COMPOUNDED ITEM [facility] pharmacy:  Combo Pain #1 - Baclofen 3%, Gabapentin 8%, Aripiprazole 8.1%, Ketorolac 8%, Licocaine 5%, dispense 240grams, apply 1-2 pumps to focal pain area 3-4 times daily, rub in 1-2 minutes, +3Refills 240 each 3  . ondansetron (ZOFRAN) 4 MG tablet     . pantoprazole (PROTONIX) 40 MG tablet Take 2 tablets by mouth daily.     . pravastatin (PRAVACHOL) 40 MG tablet Take 40 mg by mouth daily.     No current facility-administered medications for this visit.     ROS:   General:  No weight loss, Fever, chills  HEENT: positive recent headaches, no nasal bleeding, no visual changes, no sore throat  Neurologic: positive dizziness, blackouts, seizures. No recent symptoms of stroke or mini- stroke. No recent episodes of slurred speech, or temporary blindness.  Cardiac: No recent episodes of chest pain/pressure, no shortness of breath at rest.  No shortness of breath with exertion.  Denies history of atrial fibrillation or irregular heartbeat  Vascular: No history of rest pain in feet.  No history of claudication.  No history of non-healing ulcer, No history of DVT   Pulmonary: No home oxygen, no productive cough, no hemoptysis,  No asthma or wheezing  Musculoskeletal:  _0  Arthritis, _1  Low back pain,  _2  Joint pain  Hematologic:No history of hypercoagulable state.  No history of  easy bleeding.  No history of anemia  Gastrointestinal: No hematochezia or melena,  No gastroesophageal reflux, no trouble swallowing  Urinary: _3  chronic Kidney disease, _4  on HD - _5  MWF or _6  TTHS, _7  Burning with urination, _8  Frequent urination, _9  Difficulty urinating;   Skin: No rashes  Psychological: No history of anxiety,  No history of depression   Physical Examination  Vitals:   11/07/15 1253 11/07/15 1254  BP: 98/76 116/65  Pulse: 68   Resp: 16   Temp: 97.2 F (36.2 C)   SpO2: 94%   Weight: 183 lb (83 kg)   Height: 5' 4" (1.626 m)  Body mass index is 31.41 kg/m.  General:  Alert and oriented, no acute distress HEENT: Normal Neck: No bruit or JVD Pulmonary: Clear to auscultation bilaterally Cardiac: Regular Rate and Rhythm without murmur Abdomen: Soft, non-tender, non-distended, no mass, no scars Skin: No rash Extremity Pulses:  2+ right radial, non palpable radial artery on the left  brachial, femoral, dorsalis pedis, posterior tibial pulses bilaterally Musculoskeletal: No deformity or edema  Neurologic: Upper and lower extremity motor 5/5 and symmetric  DATA:  CTA head and neck on 10/20/2015 which showed stenosis of right CCA and distal right ICA, as well as left ICA 76% stenosis. In addition, left subclavian artery significant narrowing and origin, proximal to left VA takeoff. Consistent with left VA regional grade flow seen on CUS and therefore subclavian steal syndrome.   ASSESSMENT:   Left carotid re stenosis ICA 79 % Right carotid restenosis  Left subclavian  Significant narrowing   PLAN:   Tuesday 11/15/2015 Dr. Trula Slade plans to perform angiogram of the bilateral carotids and upper extremities with possible intervention to the left subclavian artery.   She may also need intervention to the left carotid due to the re stenosis at a later date.  It is < 80% stenotic currently.     COLLINS, EMMA MAUREEN PA-C [facility]  The patient was seen today in conjunction with Dr. Etta Quill   I agree with the above.  I have seen and evaluated the patient.  She is status post staged bilateral carotid endarterectomies for asymptomatic high-grade stenosis.  She recently had an episode of transient double vision and vertigo on 10/12/2015.  She is continued to have lightheadedness and the posterior headache.  She underwent CT scan imaging which revealed significant left subclavian stenosis and recurrent left carotid stenosis.  Dr. Erlinda Hong felt her symptoms were consistent with left subclavian steal.  I have recommended that we proceed with aortic arch angiography to evaluate bilateral carotid arteries and her innominate artery as well as bilateral subclavian arteries.  If I feel the subclavian artery stenosis on the left is significant we will proceed with stenting at that time.  This is been scheduled for Tuesday, August 1

## 2015-11-08 ENCOUNTER — Other Ambulatory Visit: Payer: Self-pay

## 2015-11-15 ENCOUNTER — Other Ambulatory Visit: Payer: Self-pay | Admitting: *Deleted

## 2015-11-15 ENCOUNTER — Encounter (HOSPITAL_COMMUNITY): Payer: Self-pay | Admitting: *Deleted

## 2015-11-15 ENCOUNTER — Ambulatory Visit (HOSPITAL_COMMUNITY)
Admission: RE | Admit: 2015-11-15 | Discharge: 2015-11-15 | Disposition: A | Discharge status: Home / Self Care | Payer: PPO | Source: Ambulatory Visit | Attending: Surgery | Admitting: Surgery

## 2015-11-15 ENCOUNTER — Encounter (HOSPITAL_COMMUNITY)
Admission: RE | Disposition: A | Discharge status: Home / Self Care | Payer: Self-pay | Source: Ambulatory Visit | Attending: Surgery

## 2015-11-15 DIAGNOSIS — J449 Chronic obstructive pulmonary disease, unspecified: Secondary | ICD-10-CM | POA: Diagnosis not present

## 2015-11-15 DIAGNOSIS — I771 Stricture of artery: Secondary | ICD-10-CM | POA: Diagnosis not present

## 2015-11-15 DIAGNOSIS — Z823 Family history of stroke: Secondary | ICD-10-CM | POA: Diagnosis not present

## 2015-11-15 DIAGNOSIS — Z8719 Personal history of other diseases of the digestive system: Secondary | ICD-10-CM | POA: Insufficient documentation

## 2015-11-15 DIAGNOSIS — Z8249 Family history of ischemic heart disease and other diseases of the circulatory system: Secondary | ICD-10-CM | POA: Diagnosis not present

## 2015-11-15 DIAGNOSIS — F419 Anxiety disorder, unspecified: Secondary | ICD-10-CM | POA: Insufficient documentation

## 2015-11-15 DIAGNOSIS — K219 Gastro-esophageal reflux disease without esophagitis: Secondary | ICD-10-CM | POA: Diagnosis not present

## 2015-11-15 DIAGNOSIS — E1151 Type 2 diabetes mellitus with diabetic peripheral angiopathy without gangrene: Secondary | ICD-10-CM | POA: Diagnosis not present

## 2015-11-15 DIAGNOSIS — I708 Atherosclerosis of other arteries: Secondary | ICD-10-CM | POA: Diagnosis not present

## 2015-11-15 DIAGNOSIS — Z87891 Personal history of nicotine dependence: Secondary | ICD-10-CM | POA: Insufficient documentation

## 2015-11-15 DIAGNOSIS — E785 Hyperlipidemia, unspecified: Secondary | ICD-10-CM | POA: Insufficient documentation

## 2015-11-15 DIAGNOSIS — I6522 Occlusion and stenosis of left carotid artery: Secondary | ICD-10-CM | POA: Insufficient documentation

## 2015-11-15 DIAGNOSIS — E7800 Pure hypercholesterolemia, unspecified: Secondary | ICD-10-CM | POA: Diagnosis not present

## 2015-11-15 DIAGNOSIS — I10 Essential (primary) hypertension: Secondary | ICD-10-CM | POA: Diagnosis not present

## 2015-11-15 DIAGNOSIS — I6523 Occlusion and stenosis of bilateral carotid arteries: Secondary | ICD-10-CM | POA: Diagnosis not present

## 2015-11-15 DIAGNOSIS — Z7982 Long term (current) use of aspirin: Secondary | ICD-10-CM | POA: Insufficient documentation

## 2015-11-15 DIAGNOSIS — Z01812 Encounter for preprocedural laboratory examination: Secondary | ICD-10-CM

## 2015-11-15 DIAGNOSIS — Z7984 Long term (current) use of oral hypoglycemic drugs: Secondary | ICD-10-CM | POA: Insufficient documentation

## 2015-11-15 HISTORY — PX: PERIPHERAL VASCULAR CATHETERIZATION: SHX172C

## 2015-11-15 LAB — POCT ACTIVATED CLOTTING TIME
ACTIVATED CLOTTING TIME: 224 s
ACTIVATED CLOTTING TIME: 164 s
Activated Clotting Time: 202 seconds
Activated Clotting Time: 213 seconds

## 2015-11-15 LAB — GLUCOSE, CAPILLARY
Glucose-Capillary: 149 mg/dL — ABNORMAL HIGH (ref 65–99)
Glucose-Capillary: 160 mg/dL — ABNORMAL HIGH (ref 65–99)

## 2015-11-15 LAB — POCT I-STAT, CHEM 8
BUN: 25 mg/dL — AB (ref 6–20)
CHLORIDE: 101 mmol/L (ref 101–111)
Calcium, Ion: 1.18 mmol/L (ref 1.12–1.23)
Creatinine, Ser: 0.7 mg/dL (ref 0.44–1.00)
Glucose, Bld: 147 mg/dL — ABNORMAL HIGH (ref 65–99)
HCT: 47 % — ABNORMAL HIGH (ref 36.0–46.0)
Hemoglobin: 16 g/dL — ABNORMAL HIGH (ref 12.0–15.0)
POTASSIUM: 4 mmol/L (ref 3.5–5.1)
SODIUM: 141 mmol/L (ref 135–145)
TCO2: 27 mmol/L (ref 0–100)

## 2015-11-15 SURGERY — AORTIC ARCH ANGIOGRAPHY

## 2015-11-15 MED ORDER — LIDOCAINE HCL (PF) 1 % IJ SOLN
INTRAMUSCULAR | Status: AC
  Filled 2015-11-15: qty 30

## 2015-11-15 MED ORDER — HEPARIN (PORCINE) IN NACL 2-0.9 UNIT/ML-% IJ SOLN
INTRAMUSCULAR | Status: AC
  Filled 2015-11-15: qty 1000

## 2015-11-15 MED ORDER — IODIXANOL 320 MG/ML IV SOLN
INTRAVENOUS | Status: DC | PRN
  Administered 2015-11-15: 90 mL via INTRA_ARTERIAL

## 2015-11-15 MED ORDER — LIDOCAINE HCL (PF) 1 % IJ SOLN
INTRAMUSCULAR | Status: DC | PRN
  Administered 2015-11-15: 15 mL

## 2015-11-15 MED ORDER — MIDAZOLAM HCL 2 MG/2ML IJ SOLN
INTRAMUSCULAR | Status: AC
  Filled 2015-11-15: qty 2

## 2015-11-15 MED ORDER — ALUM & MAG HYDROXIDE-SIMETH 200-200-20 MG/5ML PO SUSP: 15.0000 mL | ORAL | Status: DC | PRN

## 2015-11-15 MED ORDER — ACETAMINOPHEN 325 MG RE SUPP: 325.0000 mg | RECTAL | Status: DC | PRN

## 2015-11-15 MED ORDER — SODIUM CHLORIDE 0.9 % IV SOLN: 1.0000 mL/kg/h | INTRAVENOUS | Status: DC

## 2015-11-15 MED ORDER — ACETAMINOPHEN 325 MG PO TABS: 325.0000 mg | ORAL_TABLET | ORAL | Status: DC | PRN

## 2015-11-15 MED ORDER — OXYCODONE HCL 5 MG PO TABS
ORAL_TABLET | ORAL | Status: AC
  Filled 2015-11-15: qty 1

## 2015-11-15 MED ORDER — HYDRALAZINE HCL 20 MG/ML IJ SOLN: 5.0000 mg | INTRAMUSCULAR | Status: DC | PRN

## 2015-11-15 MED ORDER — HEPARIN SODIUM (PORCINE) 1000 UNIT/ML IJ SOLN
INTRAMUSCULAR | Status: DC | PRN
  Administered 2015-11-15: 2000 [IU] via INTRAVENOUS
  Administered 2015-11-15: 8000 [IU] via INTRAVENOUS

## 2015-11-15 MED ORDER — LABETALOL HCL 5 MG/ML IV SOLN
10.0000 mg | INTRAVENOUS | Status: DC | PRN
  Administered 2015-11-15: 10 mg via INTRAVENOUS

## 2015-11-15 MED ORDER — HEPARIN SODIUM (PORCINE) 1000 UNIT/ML IJ SOLN
INTRAMUSCULAR | Status: AC
  Filled 2015-11-15: qty 1

## 2015-11-15 MED ORDER — DOCUSATE SODIUM 100 MG PO CAPS: 100.0000 mg | ORAL_CAPSULE | Freq: Every day | ORAL | Status: DC

## 2015-11-15 MED ORDER — PHENOL 1.4 % MT LIQD: 1.0000 | OROMUCOSAL | Status: DC | PRN

## 2015-11-15 MED ORDER — ONDANSETRON HCL 4 MG/2ML IJ SOLN: 4.0000 mg | Freq: Four times a day (QID) | INTRAMUSCULAR | Status: DC | PRN

## 2015-11-15 MED ORDER — MORPHINE SULFATE (PF) 10 MG/ML IV SOLN: 2.0000 mg | INTRAVENOUS | Status: DC | PRN

## 2015-11-15 MED ORDER — GUAIFENESIN-DM 100-10 MG/5ML PO SYRP: 15.0000 mL | ORAL_SOLUTION | ORAL | Status: DC | PRN

## 2015-11-15 MED ORDER — METOPROLOL TARTRATE 5 MG/5ML IV SOLN
2.0000 mg | INTRAVENOUS | Status: DC | PRN
Start: 2015-11-15 — End: 2015-11-15

## 2015-11-15 MED ORDER — HEPARIN (PORCINE) IN NACL 2-0.9 UNIT/ML-% IJ SOLN
INTRAMUSCULAR | Status: DC | PRN
  Administered 2015-11-15: 1000 mL

## 2015-11-15 MED ORDER — LABETALOL HCL 5 MG/ML IV SOLN
INTRAVENOUS | Status: AC
  Filled 2015-11-15: qty 4

## 2015-11-15 MED ORDER — SODIUM CHLORIDE 0.9 % IV SOLN
INTRAVENOUS | Status: DC
  Administered 2015-11-15: 08:00:00 via INTRAVENOUS

## 2015-11-15 MED ORDER — OXYCODONE HCL 5 MG PO TABS
5.0000 mg | ORAL_TABLET | ORAL | Status: DC | PRN
  Administered 2015-11-15: 10 mg via ORAL
  Filled 2015-11-15: qty 2

## 2015-11-15 MED ORDER — FENTANYL CITRATE (PF) 100 MCG/2ML IJ SOLN
INTRAMUSCULAR | Status: DC | PRN
  Administered 2015-11-15 (×2): 25 ug via INTRAVENOUS

## 2015-11-15 MED ORDER — FENTANYL CITRATE (PF) 100 MCG/2ML IJ SOLN
INTRAMUSCULAR | Status: AC
  Filled 2015-11-15: qty 2

## 2015-11-15 MED ORDER — MIDAZOLAM HCL 2 MG/2ML IJ SOLN
INTRAMUSCULAR | Status: DC | PRN
  Administered 2015-11-15 (×2): 1 mg via INTRAVENOUS

## 2015-11-15 SURGICAL SUPPLY — 23 items
CATH ANGIO 5F BER2 100CM (CATHETERS) ×1 IMPLANT
CATH ANGIO 5F PIGTAIL 100CM (CATHETERS) ×1 IMPLANT
CATH HEADHUNTER 5F 125CM (CATHETERS) ×1 IMPLANT
CATH INFINITI VERT 5FR 125CM (CATHETERS) ×1 IMPLANT
COVER PRB 48X5XTLSCP FOLD TPE (BAG) IMPLANT
COVER PROBE 5X48 (BAG) ×4
DEVICE CONTINUOUS FLUSH (MISCELLANEOUS) ×1 IMPLANT
DEVICE TORQUE H2O (MISCELLANEOUS) ×1 IMPLANT
GUIDEWIRE ANGLED .035X260CM (WIRE) ×1 IMPLANT
KIT ENCORE 26 ADVANTAGE (KITS) ×1 IMPLANT
KIT MICROINTRODUCER STIFF 5F (SHEATH) ×1 IMPLANT
KIT PV (KITS) ×4 IMPLANT
SHEATH PINNACLE 5F 10CM (SHEATH) ×1 IMPLANT
SHEATH PINNACLE 7F 10CM (SHEATH) ×1 IMPLANT
SHEATH SHUTTLE 7FR (SHEATH) ×1 IMPLANT
STENT EXPRESS LD 8X27X135 (Permanent Stent) ×1 IMPLANT
SYR MEDRAD MARK V 150ML (SYRINGE) ×4 IMPLANT
TRANSDUCER W/STOPCOCK (MISCELLANEOUS) ×4 IMPLANT
TRAY PV CATH (CUSTOM PROCEDURE TRAY) ×4 IMPLANT
WIRE BENTSON .035X145CM (WIRE) ×1 IMPLANT
WIRE HI TORQ VERSACORE J 260CM (WIRE) ×1 IMPLANT
WIRE ROSEN-J .035X260CM (WIRE) ×1 IMPLANT
WIRE SAFE-T 1.5MM-J .035X260CM (WIRE) ×1 IMPLANT

## 2015-11-15 NOTE — Op Note (Addendum)
Patient name: Caitlin Oliver MRN: [id number] DOB: 1940/12/12 Sex: female  11/15/2015 Pre-operative Diagnosis: #1: Left subclavian stenosis.  #2: Bilateral carotid restenosis Post-operative diagnosis:  Same Surgeon:  Servando Snare Co-Surgeon:  Annamarie Major Procedure Performed:  1.  Ultrasound-guided access, right femoral artery  2.  Aortic arch angiogram  3.  Second order catheterization of (right carotid artery)  4.  Right carotid angiogram  5.  First order catheterization (left carotid artery)  6.  Left carotid angiogram  7.  Left subclavian stent  8.  Conscious sedation (95 min)   Indications:  The patient has a history of bilateral carotid endarterectomy for a synthetic stenosis.  She has recently developed posterior circulation difficulties and a CT scan showed a high-grade left subclavian stenosis.  She comes in today for carotid evaluation and possible intervention to her left subclavian artery  Procedure:  The patient was identified in the holding area and taken to room 8.  The patient was then placed supine on the table and prepped and draped in the usual sterile fashion.  A time out was called.  Conscious sedation was performed with the use of IV fentanyl and Versed under continuous monitoring from the physician and circulating nurse.  Heart rate oxygen saturations and blood pressure were continuously monitored.  Ultrasound was used to evaluate the right common femoral artery.  It was patent .  A digital ultrasound image was acquired.  A micropuncture needle was used to access the right common femoral artery under ultrasound guidance.  An 018 wire was advanced without resistance and a micropuncture sheath was placed.  The 018 wire was removed and a benson wire was placed.  The micropuncture sheath was exchanged for a 5 french sheath.  A pigtail catheter was advanced into the ascending aorta and an aortic arch angiogram was performed.  Next, the patient was fully heparinized.  Using a  Berenstein 2 catheter, the right common carotid artery origin was selected and a right carotid antrum was performed.  Next a Berenstein 2 catheter was used to select the left carotid artery and a left carotid antrum was performed.  A pigtail catheter was then placed at the origin of the left subclavian artery to get better evaluation of the left subclavian.   Findings:   Aortic arch:  A type I aortic arch is visualized with no significant ostial great vessel stenosis  Right carotid artery:  The right innominate and right subclavian artery are visualized and widely patent as is the right vertebral artery.  The origin of the right common carotid artery has approximately 40% stenosis.  The right carotid endarterectomy site is widely patent  Left carotid artery:  Left common carotid and external carotid artery are widely patent.  Previous carotid endarterectomy is patent with approximately 50-60% stenosis   Left subclavian artery: Significant calcified ostial left subclavian stenosis of approximately 90%  Intervention:  After the above images were acquired, heparin levels were confirmed with a CT.  A 7 French sheath was inserted.  Next, using a 035 vertebral catheter and a Glidewire, the subclavian artery stenosis was successfully crossed.  This was somewhat difficult because of the ostial calcification.  The cath was unable to be advanced into the axillary artery.  A wire exchange was performed and a Rosen wire was inserted.  Next the dilator was replaced and the sheath was inserted across the stenosis.  A 8 x 27 express balloon expandable stent was deployed across the lesion with slight overlay  into the aorta.  The balloon was taken to nominal pressure.  Completion imaging revealed resolution of the stenosis.  Catheters and wires were removed.  The patient was taken the holding area for sheath pull and squamous profile corrects.  Impression:  #1  no significant right carotid artery stenosis.  #2   approximately 50-60 percent left carotid stenosis stenosis  #3  90% left subclavian stenosis, successfully stented with residual stenosis less than 10%    V. Annamarie Major, M.D. [facility] Office: [phone] Pager:  [phone]

## 2015-11-15 NOTE — Interval H&P Note (Signed)
History and Physical Interval Note:  11/15/2015 8:08 AM  Caitlin Oliver  has presented today for surgery, with the diagnosis of carotid restenosis - left subclavion stenosis  The various methods of treatment have been discussed with the patient and family. After consideration of risks, benefits and other options for treatment, the patient has consented to  Procedure(s): Aortic Arch Angiography (N/A) Carotid Angiography (N/A) as a surgical intervention .  The patient's history has been reviewed, patient examined, no change in status, stable for surgery.  I have reviewed the patient's chart and labs.  Questions were answered to the patient's satisfaction.     Annamarie Major

## 2015-11-15 NOTE — Progress Notes (Addendum)
Site area: Right groin a 7 french arterial sheath was removed  Site Prior to Removal:  Level 0  Pressure Applied For 20 MINUTES    Bedrest Beginning at 1225p  Manual:   Yes.    Patient Status During Pull:  stable  Post Pull Groin Site:  Level 0  Post Pull Instructions Given:  Yes.    Post Pull Pulses Present:  Yes.    Dressing Applied:  Yes.    Comments:  VS remain stable during sheath pull.

## 2015-11-15 NOTE — H&P (View-Only) (Signed)
Referring Physician: [name], PA-C  Patient name: Caitlin Oliver MRN: [id number] DOB: 1941-03-14 Sex: female  REASON FOR CONSULT: Carotid re stenosis and left  subclavian stenosis with symptoms of dizziness.  HPI: Caitlin Oliver is a 75 y.o. female Pt returns today at the request of Dr. Erlinda Hong. Phone call received from niece on 11/01/15. Pt sas been referred by Dr. Erlinda Hong to see Dr. Trula Slade, and the appt is not until 8/23. Stated the pt had an episode of double vision and extreme dizziness on 10/12/15, that lasted for over 1 hour. Stated she saw Neurology, and was told she has another blockage. Stated last evening (10/31/15), she had difficulty walking from the counter to the kitchen table. Niece voiced concern that the pt. needs to be evaluated much sooner than is scheduled.  Her dizziness is subsiding and she reports no weakness in her extremities.    Past medical history bilateral carotid endarterectomy for asymptomatic high-grade stenosis: left CEA 05/07/13, right CEA 02/15/13. Her past medical history also includes HTN managed with lopressor, hyperlipidemia managed with pravastatin and DM managed with PO metformin.         Past Medical History:  Diagnosis Date  . Anxiety   . Carotid artery disease ([place])   . Claudication ([place])   . COPD (chronic obstructive pulmonary disease) ([place])   . Diabetes mellitus   . Dizzy   . GERD (gastroesophageal reflux disease)   . Hypercholesteremia   . Hypertension   . 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 Shortness of breath    with exertion  . Stomach ulcer   . Tobacco abuse    Past Surgical History:  Procedure Laterality Date  . ABDOMINAL HYSTERECTOMY    . APPENDECTOMY    . BREAST REDUCTION SURGERY    . CAROTID ANGIOGRAM N/A 02/25/2013   Procedure: CAROTID ANGIOGRAM;  Surgeon: Lorretta Harp, MD;  Location: [facility] CATH LAB;  Service: Cardiovascular;  Laterality: N/A;  . ENDARTERECTOMY  Right 03/06/2013   Procedure: ENDARTERECTOMY CAROTID-RIGHT;  Surgeon: Serafina Mitchell, MD;  Location: [place];  Service: Vascular;  Laterality: Right;  . ENDARTERECTOMY Left 05/07/2013   Procedure: LEFT CAROTID ARTERY ENDARTERECTOMY WITH VASCU-GUARD PATCH ANGIOPLASTY ;  Surgeon: Serafina Mitchell, MD;  Location: [place];  Service: Vascular;  Laterality: Left;  . LOWER EXTREMITY ANGIOGRAM N/A 02/25/2013   Procedure: LOWER EXTREMITY ANGIOGRAM;  Surgeon: Lorretta Harp, MD;  Location: [facility] CATH LAB;  Service: Cardiovascular;  Laterality: N/A;  . LOWER EXTREMITY ANGIOGRAM N/A 07/27/2013   Procedure: LOWER EXTREMITY ANGIOGRAM;  Surgeon: Lorretta Harp, MD;  Location: [facility] CATH LAB;  Service: Cardiovascular;  Laterality: N/A;  . PATCH ANGIOPLASTY Right 03/06/2013   Procedure: PATCH ANGIOPLASTY of Right Carotid Artery using Vascu-Guard Patch;  Surgeon: Serafina Mitchell, MD;  Location: [place];  Service: Vascular;  Laterality: Right;  . SALIVARY GLAND SURGERY     scar tissue removed from left saliva glad  . TUBAL LIGATION      Family History  Problem Relation Age of Onset  . Stroke Mother   . Hypertension Mother   . Kidney failure Father     SOCIAL HISTORY: Social History   Social History  . Marital status: Widowed    Spouse name: N/A  . Number of children: N/A  . Years of education: N/A   Occupational History  . Not on file.   Social History Main Topics  .  Smoking status: Former Smoker    Packs/day: 1.50    Years: 56.00    Types: Cigarettes    Quit date: 07/07/2013  . Smokeless tobacco: Former Systems developer    Quit date: 02/25/2013  . Alcohol use No  . Drug use: No  . Sexual activity: No   Other Topics Concern  . Not on file   Social History Narrative  . No narrative on file    No Known Allergies  Current Outpatient Prescriptions  Medication Sig Dispense Refill  . aspirin 81 MG tablet Take 81 mg by mouth daily.    . Biotin (BIOTIN 5000) 5 MG CAPS Take by mouth.    . diazepam (VALIUM) 5  MG tablet Take 5 mg by mouth every 12 (twelve) hours. Anxiety    . diltiazem (CARTIA XT) 300 MG 24 hr capsule Take 300 mg by mouth daily.    . fenofibrate micronized (LOFIBRA) 134 MG capsule Take 134 mg by mouth daily before breakfast.    . fish oil-omega-3 fatty acids 1000 MG capsule Take 1 g by mouth daily.     [name] HYDROcodone-acetaminophen (NORCO/VICODIN) 5-325 MG tablet Take by mouth.    . metFORMIN (GLUCOPHAGE) 500 MG tablet Take 500 mg by mouth daily with breakfast.     . metoprolol tartrate (LOPRESSOR) 25 MG tablet Take 1 tablet by mouth 2 (two) times daily.    . NONFORMULARY OR COMPOUNDED ITEM [facility]:  [facility] #1 - Baclofen 3%, Gabapentin 8%, Aripiprazole 8.1%, Ketorolac 8%, Licocaine 5%, dispense 240grams, apply 1-2 pumps to focal pain area 3-4 times daily, rub in 1-2 minutes, +3Refills 240 each 3  . ondansetron (ZOFRAN) 4 MG tablet     . pantoprazole (PROTONIX) 40 MG tablet Take 2 tablets by mouth daily.     . pravastatin (PRAVACHOL) 40 MG tablet Take 40 mg by mouth daily.     No current facility-administered medications for this visit.     ROS:   General:  No weight loss, Fever, chills  HEENT: positive recent headaches, no nasal bleeding, no visual changes, no sore throat  Neurologic: positive dizziness, blackouts, seizures. No recent symptoms of stroke or mini- stroke. No recent episodes of slurred speech, or temporary blindness.  Cardiac: No recent episodes of chest pain/pressure, no shortness of breath at rest.  No shortness of breath with exertion.  Denies history of atrial fibrillation or irregular heartbeat  Vascular: No history of rest pain in feet.  No history of claudication.  No history of non-healing ulcer, No history of DVT   Pulmonary: No home oxygen, no productive cough, no hemoptysis,  No asthma or wheezing  Musculoskeletal:  _0  Arthritis, _1  Low back pain,  _2  Joint pain  Hematologic:No history of hypercoagulable state.  No history of  easy bleeding.  No history of anemia  Gastrointestinal: No hematochezia or melena,  No gastroesophageal reflux, no trouble swallowing  Urinary: _3  chronic Kidney disease, _4  on HD - _5  MWF or _6  TTHS, _7  Burning with urination, _8  Frequent urination, _9  Difficulty urinating;   Skin: No rashes  Psychological: No history of anxiety,  No history of depression   Physical Examination  Vitals:   11/07/15 1253 11/07/15 1254  BP: 98/76 116/65  Pulse: 68   Resp: 16   Temp: 97.2 F (36.2 C)   SpO2: 94%   Weight: 183 lb (83 kg)   Height: 5' 4" (1.626 m)  Body mass index is 31.41 kg/m.  General:  Alert and oriented, no acute distress HEENT: Normal Neck: No bruit or JVD Pulmonary: Clear to auscultation bilaterally Cardiac: Regular Rate and Rhythm without murmur Abdomen: Soft, non-tender, non-distended, no mass, no scars Skin: No rash Extremity Pulses:  2+ right radial, non palpable radial artery on the left  brachial, femoral, dorsalis pedis, posterior tibial pulses bilaterally Musculoskeletal: No deformity or edema  Neurologic: Upper and lower extremity motor 5/5 and symmetric  DATA:  CTA head and neck on 10/20/2015 which showed stenosis of right CCA and distal right ICA, as well as left ICA 76% stenosis. In addition, left subclavian artery significant narrowing and origin, proximal to left VA takeoff. Consistent with left VA regional grade flow seen on CUS and therefore subclavian steal syndrome.   ASSESSMENT:   Left carotid re stenosis ICA 79 % Right carotid restenosis  Left subclavian  Significant narrowing   PLAN:   Tuesday 11/15/2015 Dr. Trula Slade plans to perform angiogram of the bilateral carotids and upper extremities with possible intervention to the left subclavian artery.   She may also need intervention to the left carotid due to the re stenosis at a later date.  It is < 80% stenotic currently.     COLLINS, EMMA MAUREEN PA-C [facility]  The patient was seen today in conjunction with Dr. Etta Quill   I agree with the above.  I have seen and evaluated the patient.  She is status post staged bilateral carotid endarterectomies for asymptomatic high-grade stenosis.  She recently had an episode of transient double vision and vertigo on 10/12/2015.  She is continued to have lightheadedness and the posterior headache.  She underwent CT scan imaging which revealed significant left subclavian stenosis and recurrent left carotid stenosis.  Dr. Erlinda Hong felt her symptoms were consistent with left subclavian steal.  I have recommended that we proceed with aortic arch angiography to evaluate bilateral carotid arteries and her innominate artery as well as bilateral subclavian arteries.  If I feel the subclavian artery stenosis on the left is significant we will proceed with stenting at that time.  This is been scheduled for Tuesday, August 1

## 2015-11-15 NOTE — Discharge Instructions (Signed)

## 2015-11-16 ENCOUNTER — Encounter (HOSPITAL_COMMUNITY): Payer: Self-pay | Admitting: Surgery

## 2015-11-17 ENCOUNTER — Telehealth: Payer: Self-pay | Admitting: Surgery

## 2015-11-17 ENCOUNTER — Telehealth: Payer: Self-pay

## 2015-11-17 NOTE — Telephone Encounter (Signed)
-----  Message from Mena Goes, RN sent at 11/15/2015 10:55 AM EDT ----- Regarding: schedule Stent follow up and carotid duplex    ----- Message ----- From: Serafina Mitchell, MD Sent: 11/15/2015  10:29 AM To: [facility] Charge Pool  11-15-2015:  Co-Surgeon:  Annamarie Major Procedure Performed:  1.  Ultrasound-guided access, right femoral artery  2.  Aortic arch angiogram  3.  Second order catheterization of (right carotid artery)  4.  Right carotid angiogram  5.  First order catheterization (left carotid artery)  6.  Left carotid angiogram  7.  Left subclavian stent  8.  Conscious sedation (95 min)   F/u 1 month with carotid duplex

## 2015-11-17 NOTE — Telephone Encounter (Signed)
Sched appt 9/11; lab at 3:00 and MD at 3:45. Spoke to pt to inform them of appt.

## 2015-11-17 NOTE — Telephone Encounter (Signed)
Rec'd phone call from pt.  C/o continued headache and feels like her "head is swimming."  Reported she had these symptoms prior to surgery, but that her nephew was informed by Dr. Trula Slade, that the symptoms should be immediately relieved.  Rated headache at 5-6/10.  Denied any speech difficulty, visual disturbance, weakness of extremities.  Reported the headache is worse with bright lights, noise, and even with loud talking. Denied any problems with moving around, but stated "I have to be careful; I get light-headed with movement." Questioned if this will take time, or should have improved by now.  Advised will make Dr. Trula Slade aware, and return call to pt.  Verb. Understanding/ agreed.

## 2015-11-18 NOTE — Telephone Encounter (Signed)
Called Dr. Trula Slade and reported pt's c/o continued headache, and dizziness following her procedure on 11/15/15.  Rec'd recommendation to have pt. make appt. with Neurologist.  Notified pt. of need to contact Neurologist for her symptoms.  Verb. Understanding and agreed.

## 2015-11-22 ENCOUNTER — Encounter: Payer: Self-pay | Admitting: Neurology

## 2015-11-22 ENCOUNTER — Ambulatory Visit (INDEPENDENT_AMBULATORY_CARE_PROVIDER_SITE_OTHER): Payer: PPO | Admitting: Neurology

## 2015-11-22 VITALS — BP 100/69 | HR 64 | Ht 64.0 in | Wt 182.1 lb

## 2015-11-22 DIAGNOSIS — G458 Other transient cerebral ischemic attacks and related syndromes: Secondary | ICD-10-CM

## 2015-11-22 DIAGNOSIS — I779 Disorder of arteries and arterioles, unspecified: Secondary | ICD-10-CM | POA: Diagnosis not present

## 2015-11-22 DIAGNOSIS — E785 Hyperlipidemia, unspecified: Secondary | ICD-10-CM

## 2015-11-22 DIAGNOSIS — M5481 Occipital neuralgia: Secondary | ICD-10-CM | POA: Diagnosis not present

## 2015-11-22 DIAGNOSIS — I708 Atherosclerosis of other arteries: Secondary | ICD-10-CM

## 2015-11-22 DIAGNOSIS — I771 Stricture of artery: Secondary | ICD-10-CM

## 2015-11-22 DIAGNOSIS — I739 Peripheral vascular disease, unspecified: Secondary | ICD-10-CM | POA: Diagnosis not present

## 2015-11-22 NOTE — Progress Notes (Signed)
NEUROLOGY CLINIC FOLLOW UP NOTE  NAME: Caitlin Oliver DOB: 1940/10/19  HPI: Caitlin Oliver is a 75 y.o. female with PMH of HTN, HLD, PVD, and carotid stenosis s/p b/l CEAs who presents as a new patient for Episode of double vision and vertigo.   She has bilateral carotid stenosis s/p bilateral CEAs around 2014 about 6 weeks apart with Dr. Trula Slade. Followed with Dr. Gwenlyn Found and CUS on 09/01/14 showed left ICA 50-69% stenosis with left VA retrograde flow. She had recent CUS on 04/27/15 showed right ICA 1-39% stenosis but left ICA progressed to 60-79% stenosis with left VA retrograde flow.  Patient stated that on 10/12/2015 she was in the drugstore reading signs over her head, when she bent down her head she felt sudden onset double vision, and vertigo. Double vision was horizontal diplopia lasted about one hour and gradually resolved. Vertigo feels like head round and round, and she had to sit down due to imbalance, no nausea vomiting, no fall, lasted about one hour gradually resolved. After that, she continued to feel swimmy headed with headache, walking to the right side, had to hold on things on walking. Went to see PCP that day, blood pressure 140/70, recommended to go to ER, however patient refused, only agreed to do CTA head and neck. She had to use a walker for the next 2 days, and she still felt lightheaded for about 2 weeks. She had a CTA head and neck on 10/20/2015 which showed stenosis of right CCA and distal right ICA, as well as left ICA 76% stenosis. In addition, left subclavian artery significant narrowing and origin, proximal to left VA takeoff. Consistent with left VA regional grade flow seen on CUS and therefore subclavian steal syndrome.   Patient today in clinic continues to complain of lightheadedness, posterior head headache, light sensitivity. Check blood pressure 92/56. She stated that she took BP meds this morning. She also stated that her BP is also low at home.    She has hx of  HTN, HLD following with Dr. Gwenlyn Found, on pravastatin, diltiazem, HCTZ, metoprolol and lisinopirl. She also has PVD s/p diamondback orbital rotational atherectomy, PTA and stenting using a cast stents of her iliac arteries by Dr. Judithann Sauger 07/27/13 resulted in complete resolution of her claudication symptoms and normalization of her Dopplers.   She had GI bleeding in 2016 taking aspirin and the Plavix after stents placed in lower extremities. Had a GI consult, required 3 units of blood transfusion. GI workup showed bleeding ulcer in the stomach. She was told not to take any Plavix anymore. She is on aspirin 81 currently. Follow up with GI later was told ulcer was healed.  Interval history: During the interval time, patient had followed up with Dr. Trula Slade and had cerebral angiogram as well as left subclavian artery stenting due to high-grade stenosis with subclavian steal syndrome. Patient tolerating with the procedure pretty well. BP check at home much better at 120-140s, today in clinic 100/69. However, patient continue to complain of neck pain, radiating headache to bilateral temporal and vertex, and lightheadedness. Found to have bilateral occipital neuralgia.    Past Medical History:  Diagnosis Date  . Anxiety   . Carotid artery disease ([place])   . Claudication ([place])   . COPD (chronic obstructive pulmonary disease) ([place])   . Diabetes mellitus   . Dizzy   . GERD (gastroesophageal reflux disease)   . Hypercholesteremia   . Hypertension   . Peripheral arterial disease ([place])  bilateral iliac artery stenosis by angiography  . Shortness of breath    with exertion  . Stomach ulcer   . Tobacco abuse    Past Surgical History:  Procedure Laterality Date  . ABDOMINAL HYSTERECTOMY    . APPENDECTOMY    . BREAST REDUCTION SURGERY    . CAROTID ANGIOGRAM N/A 02/25/2013   Procedure: CAROTID ANGIOGRAM;  Surgeon: Lorretta Harp, MD;  Location: [facility] CATH LAB;  Service: Cardiovascular;  Laterality: N/A;  .  ENDARTERECTOMY Right 03/06/2013   Procedure: ENDARTERECTOMY CAROTID-RIGHT;  Surgeon: Serafina Mitchell, MD;  Location: [place];  Service: Vascular;  Laterality: Right;  . ENDARTERECTOMY Left 05/07/2013   Procedure: LEFT CAROTID ARTERY ENDARTERECTOMY WITH VASCU-GUARD PATCH ANGIOPLASTY ;  Surgeon: Serafina Mitchell, MD;  Location: [place];  Service: Vascular;  Laterality: Left;  . LOWER EXTREMITY ANGIOGRAM N/A 02/25/2013   Procedure: LOWER EXTREMITY ANGIOGRAM;  Surgeon: Lorretta Harp, MD;  Location: [facility] CATH LAB;  Service: Cardiovascular;  Laterality: N/A;  . LOWER EXTREMITY ANGIOGRAM N/A 07/27/2013   Procedure: LOWER EXTREMITY ANGIOGRAM;  Surgeon: Lorretta Harp, MD;  Location: [facility] CATH LAB;  Service: Cardiovascular;  Laterality: N/A;  . PATCH ANGIOPLASTY Right 03/06/2013   Procedure: PATCH ANGIOPLASTY of Right Carotid Artery using Vascu-Guard Patch;  Surgeon: Serafina Mitchell, MD;  Location: [place];  Service: Vascular;  Laterality: Right;  . PERIPHERAL VASCULAR CATHETERIZATION N/A 11/15/2015   Procedure: Aortic Arch Angiography;  Surgeon: Serafina Mitchell, MD;  Location: [place] CV LAB;  Service: Cardiovascular;  Laterality: N/A;  . PERIPHERAL VASCULAR CATHETERIZATION Bilateral 11/15/2015   Procedure: Carotid Angiography;  Surgeon: Serafina Mitchell, MD;  Location: [place] CV LAB;  Service: Cardiovascular;  Laterality: Bilateral;  . PERIPHERAL VASCULAR CATHETERIZATION Left 11/15/2015   Procedure: Upper Extremity Angiography;  Surgeon: Serafina Mitchell, MD;  Location: [place] CV LAB;  Service: Cardiovascular;  Laterality: Left;  . PERIPHERAL VASCULAR CATHETERIZATION Left 11/15/2015   Procedure: Peripheral Vascular Intervention;  Surgeon: Serafina Mitchell, MD;  Location: [place] CV LAB;  Service: Cardiovascular;  Laterality: Left;  subclavian   . SALIVARY GLAND SURGERY     scar tissue removed from left saliva glad  . TUBAL LIGATION     Family History  Problem Relation Age of Onset  . Stroke Mother     . Hypertension Mother   . Kidney failure Father    Current Outpatient Prescriptions  Medication Sig Dispense Refill  . aspirin 81 MG tablet Take 81 mg by mouth daily.    . Biotin (BIOTIN 5000) 5 MG CAPS Take 1 capsule by mouth daily.     . diazepam (VALIUM) 5 MG tablet Take 5 mg by mouth every 12 (twelve) hours. Anxiety    . diltiazem (CARTIA XT) 300 MG 24 hr capsule Take 300 mg by mouth daily.    . fish oil-omega-3 fatty acids 1000 MG capsule Take 1 g by mouth daily.     [name] HYDROcodone-acetaminophen (NORCO/VICODIN) 5-325 MG tablet Take 1 tablet by mouth every 6 (six) hours as needed for moderate pain.     . metFORMIN (GLUCOPHAGE) 500 MG tablet Take 500 mg by mouth daily with breakfast.     . metoprolol tartrate (LOPRESSOR) 25 MG tablet Take 1 tablet by mouth daily.     . NONFORMULARY OR COMPOUNDED ITEM [facility]:  [facility] #[facility] - Baclofen 3%, Gabapentin 8%, Aripiprazole 5.4%, Ketorolac 8%, Licocaine 5%, dispense 240grams, apply 1-2 pumps to focal pain area 3-4  times daily, rub in 1-2 minutes, +3Refills 240 each 3  . ondansetron (ZOFRAN) 4 MG tablet Take 4 mg by mouth every 8 (eight) hours as needed for nausea or vomiting.     . pantoprazole (PROTONIX) 40 MG tablet Take 2 tablets by mouth daily.     . pravastatin (PRAVACHOL) 40 MG tablet Take 40 mg by mouth daily.     No current facility-administered medications for this visit.    No Known Allergies Social History   Social History  . Marital status: Widowed    Spouse name: N/A  . Number of children: N/A  . Years of education: N/A   Occupational History  . Not on file.   Social History Main Topics  . Smoking status: Former Smoker    Packs/day: 1.50    Years: 56.00    Types: Cigarettes    Quit date: 07/07/2013  . Smokeless tobacco: Former Systems developer    Quit date: 02/25/2013  . Alcohol use No  . Drug use: No  . Sexual activity: No   Other Topics Concern  . Not on file   Social History Narrative  . No narrative  on file    Review of Systems Full 14 system review of systems performed and notable only for those listed, all others are neg:  Constitutional:   Cardiovascular: chest pain Ear/Nose/Throat:   Skin:  Eyes:   Respiratory:   Gastroitestinal:   Genitourinary:  Hematology/Lymphatic:   Endocrine:  Musculoskeletal:  Back pain, muscle cramps Allergy/Immunology:   Neurological:  Headache, dizziness Psychiatric: nervous Sleep:    Physical Exam  Vitals:   11/22/15 1016  BP: 100/69  Pulse: 64    General - Well nourished, well developed, in no apparent distress.  Ophthalmologic - fundi not visualized due to photophobia.  Cardiovascular - Regular rate and rhythm with no murmur.   Neck - supple, no nuchal rigidity. Limited neck ROM due to lightheadedness. However, positive occipital neuralgia bilaterally.  Mental Status -  Level of arousal and orientation to time, place, and person were intact. Language including expression, naming, repetition, comprehension, reading, and writing was assessed and found intact. Fund of Knowledge was assessed and was intact.  Cranial Nerves II - XII - II - Visual field intact OU. III, IV, VI - Extraocular movements intact. V - Facial sensation intact bilaterally. VII - left nasolabial fold flattening, this is her baseline as per patient. VIII - Hearing & vestibular intact bilaterally, no nystagmus. X - Palate elevates symmetrically. XI - Chin turning & shoulder shrug intact bilaterally. XII - Tongue protrusion intact.  Motor Strength - The patient's strength was normal in all extremities and pronator drift was absent.  Bulk was normal and fasciculations were absent.   Motor Tone - Muscle tone was assessed at the neck and appendages and was normal.  Reflexes - The patient's reflexes were normal in all extremities and she had no pathological reflexes.  Sensory - Light touch, temperature/pinprick were assessed and were normal.    Coordination  - The patient had normal movements in the hands with no ataxia or dysmetria.  Tremor was absent.  Gait and Station - feels lightheadedness on walking, slow with small stride, limited head and neck turning, no fall.   Imaging  I have personally reviewed the radiological images below and agree with the radiology interpretations.  CTA head and neck 1.4 cm lung mass left upper lobe worrisome for malignancy. CT of the chest recommended for further delineation.  CT HEAD Prominent  small vessel disease changes without CT evidence of large acute infarct. Small or medium size infarct would be difficult to exclude given the degree white matter changes. Global atrophy without hydrocephalus.  CTA NECK 3 vessel arch with calcified plaque. Aortic atherosclerosis. Plaque with moderate to marked narrowing proximal left subclavian artery, moderate narrowing proximal innominate and proximal left common carotid artery. Moderate narrowing proximal right subclavian artery. Moderate to mark narrowing proximal right common carotid artery. Post right carotid endarterectomy. Plaque with mild narrowing distal right common carotid artery. No significant stenosis right carotid bifurcation. Plaque distal right internal carotid artery with moderate to marked narrowing proximal to the skullbase. Moderate narrowing origin of the left common carotid artery. Plaque with mild to moderate narrowing mid to distal left common carotid artery. Plaque with 76% diameter stenosis proximal left internal carotid artery. Ectatic proximal vertebral arteries without high-grade stenosis. Several thyroid lesions measuring up to 3.1 cm. Thyroid ultrasound recommended for further delineation.  CTA HEAD  Calcified plaque with mild to slightly mild narrowing cavernous segment internal carotid artery bilaterally. Fetal contribution to the posterior cerebral artery bilaterally. Mild narrowing distal vertebral arteries. Mild narrowing  basilar artery. Mild narrowing portions of the posterior cerebral arteries bilaterally.   Lab Review 06/21/15 LDL 75 and TG 217    Assessment:   In summary, Caitlin Oliver is a 75 y.o. female with PMH of HTN, HLD, PVD s/p stents in LEs, carotid stenosis s/p b/l CEAs had episode of transient double vision and vertigo on 10/12/15 with neck movement. Continue to have lightheadedness and posterior headache. Her recent CUS indicating progressive left ICA re-stenosis and left VA retrograde flow. Recent CTA head and neck showed left ICA 75% stenosis, right ICA distal stenosis, and left subclavian artery origin severe stenosis. Taken together, it is consistent with subclavian steal syndrome. In addition, patient has worsening symptoms with neck moment, need to consider bow hunter syndrome. Patient BP low at home, today 92/56, likely BP medication related. She needs higher BP goal for any great cerebral perfusion due to multiple vessel stenosis. Will discontinue lisinopril and HCTZ. Need urgent VVS consultation for consider left subclavian artery stenting. Current symptoms seem not to related with carotid stenosis, however, ICA stenosis need to be closely monitored. MRI brain not done due to metal stent in body. Had VVS consult and received subclavian artery stenting. BP improved currently. However, patient continues to complain of neck pain, radiating to head, and lightheadedness. Exam showed bilateral occipital neuralgia. Will need occipital nerve block.   Plan: - continue ASA and pravastatin for stroke prevention - check BP at home and record and bring over to PCP for BP management. - follow up with vascular surgery and Dr. Gwenlyn Found for carotid stenosis.  - Follow up with your primary care physician for stroke risk factor modification. Recommend maintain blood pressure goal 130-150/80, diabetes with hemoglobin A1c goal below 7.0% and lipids with LDL cholesterol goal below 70 mg/dL.  - will need repeat CUS for  evaluation of VA flow direction.  - follow up tomorrow 9:30 am for occipital nerve block bilaterally.  I spent more than 25 minutes of face to face time with the patient. Greater than 50% of time was spent in counseling and coordination of care.    No orders of the defined types were placed in this encounter.   No orders of the defined types were placed in this encounter.   Patient Instructions  - continue ASA and pravastatin for stroke prevention - check BP  at home and record and bring over to PCP for BP management. - follow up with vascular surgery and Dr. Gwenlyn Found for carotid stenosis.  - Follow up with your primary care physician for stroke risk factor modification. Recommend maintain blood pressure goal 130-150/80, diabetes with hemoglobin A1c goal below 7.0% and lipids with LDL cholesterol goal below 70 mg/dL.  - will do occipital nerve block tomorrow. - follow up tomorrow 9:30 am for occipital nerve block bilaterally.   Rosalin Hawking, MD PhD [facility] [address], [place], [place] [address] [phone]

## 2015-11-22 NOTE — Patient Instructions (Addendum)
-  continue ASA and pravastatin for stroke prevention - check BP at home and record and bring over to PCP for BP management. - follow up with vascular surgery and Dr. Gwenlyn Found for carotid stenosis.  - Follow up with your primary care physician for stroke risk factor modification. Recommend maintain blood pressure goal 130-150/80, diabetes with hemoglobin A1c goal below 7.0% and lipids with LDL cholesterol goal below 70 mg/dL.  - will do occipital nerve block tomorrow. - follow up tomorrow 9:30 am for occipital nerve block bilaterally.

## 2015-11-23 ENCOUNTER — Other Ambulatory Visit: Payer: Self-pay | Admitting: Family Medicine

## 2015-11-23 ENCOUNTER — Ambulatory Visit (INDEPENDENT_AMBULATORY_CARE_PROVIDER_SITE_OTHER): Payer: PPO | Admitting: Neurology

## 2015-11-23 DIAGNOSIS — E041 Nontoxic single thyroid nodule: Secondary | ICD-10-CM | POA: Diagnosis not present

## 2015-11-23 DIAGNOSIS — M5481 Occipital neuralgia: Secondary | ICD-10-CM

## 2015-11-23 DIAGNOSIS — E042 Nontoxic multinodular goiter: Secondary | ICD-10-CM | POA: Diagnosis not present

## 2015-11-23 NOTE — Progress Notes (Signed)
Procedure Date:  11/23/15 Procedure: Occipital Nerve Block, bi  Pre-procedure Diagnosis: Headache  Post-procedure Diagnosis: same as above  Prior to Procedure:  Informed Consent: The risks, benefits, indications, potential complications, and alternatives were explained to the patient/family and informed consent obtained.  Attending Staff:  Dr. Rosalin Hawking Skin Prep: Cleansed with alcohol.  Anesthesia: 893m Lidocaine 1% without epinephrine without added sodium bicarbonate, decadron 158m(93m57mIndications: pt here for headache and occipital neuralgia bilaterally. The identity of the patient was confirmed and a bedside time out was performed.  Description of Procedure: Pt's skin was prepped with alcohol and lidocaine 1% and decadron 93mg46m was injected over the occipital ridge in a fan-like fashion with appropriate procedure bilaterally. Pt had immediate relief.  Findings: immediate HA relief  Complications: The patient tolerated the procedure well with no complications.  Specimens:none  Estimated blood loss: zero   JindRosalin Hawking PhD Stroke Neurology 11/23/2015 10:18 AM   Patient Instructions  - continue ASA and pravastatin for stroke prevention - check BP at home and record and bring over to PCP for BP management. - follow up with vascular surgery and Dr. BerrGwenlyn Found carotid stenosis. - you have 12/26/15 for repeat Carotid doppler test  - Follow up with your primary care physician for stroke risk factor modification. Recommend maintain blood pressure goal 130-150/80, diabetes with hemoglobin A1c goal below 7.0% and lipids with LDL cholesterol goal below 70 mg/dL.  - follow up in 3 months

## 2015-11-23 NOTE — Patient Instructions (Signed)
-  continue ASA and pravastatin for stroke prevention - check BP at home and record and bring over to PCP for BP management. - follow up with vascular surgery and Dr. Gwenlyn Found for carotid stenosis. - you have 12/26/15 for repeat Carotid doppler test  - Follow up with your primary care physician for stroke risk factor modification. Recommend maintain blood pressure goal 130-150/80, diabetes with hemoglobin A1c goal below 7.0% and lipids with LDL cholesterol goal below 70 mg/dL.  - follow up in 3 months

## 2015-11-24 ENCOUNTER — Ambulatory Visit
Admission: RE | Admit: 2015-11-24 | Discharge: 2015-11-24 | Disposition: A | Discharge status: Home / Self Care | Payer: PPO | Source: Ambulatory Visit | Attending: Family Medicine | Admitting: Family Medicine

## 2015-11-24 DIAGNOSIS — E042 Nontoxic multinodular goiter: Secondary | ICD-10-CM | POA: Diagnosis not present

## 2015-11-24 DIAGNOSIS — E041 Nontoxic single thyroid nodule: Secondary | ICD-10-CM

## 2015-11-28 ENCOUNTER — Other Ambulatory Visit: Payer: Self-pay | Admitting: Family Medicine

## 2015-11-28 DIAGNOSIS — E041 Nontoxic single thyroid nodule: Secondary | ICD-10-CM

## 2015-12-01 ENCOUNTER — Other Ambulatory Visit: Payer: Self-pay | Admitting: Family Medicine

## 2015-12-01 ENCOUNTER — Ambulatory Visit
Admission: RE | Admit: 2015-12-01 | Discharge: 2015-12-01 | Disposition: A | Discharge status: Home / Self Care | Payer: PPO | Source: Ambulatory Visit | Attending: Family Medicine | Admitting: Family Medicine

## 2015-12-01 ENCOUNTER — Other Ambulatory Visit (HOSPITAL_COMMUNITY)
Admission: RE | Admit: 2015-12-01 | Discharge: 2015-12-01 | Disposition: A | Discharge status: Home / Self Care | Payer: PPO | Source: Ambulatory Visit | Attending: General Surgery | Admitting: General Surgery

## 2015-12-01 DIAGNOSIS — E041 Nontoxic single thyroid nodule: Secondary | ICD-10-CM

## 2015-12-01 DIAGNOSIS — E079 Disorder of thyroid, unspecified: Secondary | ICD-10-CM | POA: Diagnosis not present

## 2015-12-07 ENCOUNTER — Ambulatory Visit: Payer: PPO | Admitting: Neurology

## 2015-12-07 ENCOUNTER — Encounter (HOSPITAL_COMMUNITY): Payer: PPO

## 2015-12-07 ENCOUNTER — Ambulatory Visit: Payer: PPO | Admitting: Surgery

## 2015-12-08 ENCOUNTER — Encounter: Payer: Self-pay | Admitting: Internal Medicine

## 2015-12-08 ENCOUNTER — Ambulatory Visit (INDEPENDENT_AMBULATORY_CARE_PROVIDER_SITE_OTHER): Payer: PPO | Admitting: Internal Medicine

## 2015-12-08 VITALS — BP 124/70 | HR 68 | Ht 64.0 in | Wt 183.2 lb

## 2015-12-08 DIAGNOSIS — J449 Chronic obstructive pulmonary disease, unspecified: Secondary | ICD-10-CM | POA: Insufficient documentation

## 2015-12-08 DIAGNOSIS — R911 Solitary pulmonary nodule: Secondary | ICD-10-CM | POA: Diagnosis not present

## 2015-12-08 MED ORDER — TIOTROPIUM BROMIDE-OLODATEROL 2.5-2.5 MCG/ACT IN AERS: 2.0000 | INHALATION_SPRAY | Freq: Every day | RESPIRATORY_TRACT | 0 refills | Status: DC

## 2015-12-08 NOTE — Progress Notes (Signed)
Subjective:    Patient ID: Caitlin Oliver, female    DOB: 11-11-1940,  MRN: [id number]  HPI  31 yowf quit smoking Feb 2015 with freq espisodes of bronchitis while smoking resolved p quit with dx of copd  and referred to pulmonary clinic 12/08/2015 by Dr Bing Matter for SPN incidentally found on w/u for carotid dz after ? TIA with criteria for GOLD II COPD on initial pre-rx PFTs   12/08/2015 1st [facility] office visit/ Debborah Alonge   Chief Complaint  Patient presents with  . Pulmonary Consult    Referred by Clyde Lundborg, PA for eval of pulmonary nodule. Pt states she was dxed with COPD years ago, and her breathing is currently at baseline for her.    doe x years x mopping but does fine flat surface = MMRC1 = can walk nl pace, flat grade, can't hurry or go uphills or steps s sob    No obvious day to day or daytime variability or assoc excess/ purulent sputum or mucus plugs or hemoptysis or cp or chest tightness, subjective wheeze or overt sinus or hb symptoms. No unusual exp hx or h/o childhood pna/ asthma or knowledge of premature birth.  Sleeping ok without nocturnal  or early am exacerbation  of respiratory  c/o's or need for noct saba. Also denies any obvious fluctuation of symptoms with weather or environmental changes or other aggravating or alleviating factors except as outlined above   Current Medications, Allergies, Complete Past Medical History, Past Surgical History, Family History, and Social History were reviewed in [employer] record.            Review of Systems  Constitutional: Negative for chills, fever and unexpected weight change.  HENT: Negative for congestion, dental problem, ear pain, nosebleeds, postnasal drip, rhinorrhea, sinus pressure, sneezing, sore throat, trouble swallowing and voice change.   Eyes: Negative for visual disturbance.  Respiratory: Negative for cough, choking and shortness of breath.   Cardiovascular: Negative for  chest pain and leg swelling.  Gastrointestinal: Negative for abdominal pain, diarrhea and vomiting.  Genitourinary: Negative for difficulty urinating.  Musculoskeletal: Negative for arthralgias.  Skin: Negative for rash.  Neurological: Positive for headaches. Negative for tremors and syncope.  Hematological: Does not bruise/bleed easily.       Objective:   Physical Exam  amb wf nad  Wt Readings from Last 3 Encounters:  12/08/15 183 lb 3.2 oz (83.1 kg)  11/23/15 181 lb 6.4 oz (82.3 kg)  11/22/15 182 lb 1.6 oz (82.6 kg)    Vital signs reviewed   HEENT: nl dentition, turbinates, and oropharynx. Nl external ear canals without cough reflex   NECK :  without JVD/Nodes/TM/ nl carotid upstrokes bilaterally   LUNGS: no acc muscle use,  Nl contour chest which is clear to A and P bilaterally without cough on insp or exp maneuvers   CV:  RRR  no s3 or murmur or increase in P2, no edema   ABD:  soft and nontender with nl inspiratory excursion in the supine position. No bruits or organomegaly, bowel sounds nl  MS:  Nl gait/ ext warm without deformities, calf tenderness, cyanosis or clubbing No obvious joint restrictions   SKIN: warm and dry without lesions    NEURO:  alert, approp, nl sensorium with  no motor deficits         I personally reviewed images and agree with radiology impression as follows:  CT Chest   11/07/15  12 mm left  upper lobe lobular pulmonary nodule. This is nonspecific. Cannot exclude malignancy. This could be further evaluated with PET CT to assess metabolic activity. Multiple thyroid nodules, likely multinodular goiter. This could be further evaluated with thyroid ultrasound         Assessment & Plan:

## 2015-12-08 NOTE — Patient Instructions (Addendum)
Stiolto 2 pffs each am   Work on inhaler technique:  relax and gently blow all the way out then take a nice smooth deep breath back in, triggering the inhaler at same time you start breathing in.  Hold for up to 5 seconds if you can. Blow out thru nose. Rinse and gargle with water when done     Please see patient coordinator before you leave today  to schedule PET scan - needs to be done before you next visit if at all possible   PFTs in 2 weeks at [facility] and ov same day to decide what to do next

## 2015-12-09 ENCOUNTER — Encounter: Payer: Self-pay | Admitting: Internal Medicine

## 2015-12-09 NOTE — Assessment & Plan Note (Addendum)
See CT chest  11/07/15  X 12 mm LUL prob ant segment  PET 12/08/2015 >>>   Discussed in detail all the  indications, usual  risks and alternatives  relative to the benefits with patient who agrees to proceed with w/u with PET and if c/w resectable ca then proceed wit T surgery eval with caveat she also has significant copd and likely ascvd so may need to be cleared by cards as well.  Total time devoted to counseling  = 35/33mreview case with pt/ discussion of options/alternatives/ personally creating written instructions  in presence of pt  then going over those specific  Instructions directly with the pt including how to use all of the meds but in particular covering each new medication in detail and the difference between the maintenance/automatic meds and the prns using an action plan format for the latter.

## 2015-12-09 NOTE — Assessment & Plan Note (Signed)
Quit smoking Feb 2015  - Spirometry 12/08/2015  FEV1 1.12 (53%)  Ratio 65    - 12/08/2015  After extensive coaching HFA effectiveness =  75%   > try stiolto 2 each am  She is only Group A in terms of symptom/risk now that she's quit smoking but facing a decision about possible RULobectomy so rec lama/laba trial and return for full pfts in 2 weeks at which point we can discuss fesability of lung resection if needed

## 2015-12-15 ENCOUNTER — Encounter (HOSPITAL_COMMUNITY)
Admission: RE | Admit: 2015-12-15 | Discharge: 2015-12-15 | Disposition: A | Discharge status: Home / Self Care | Payer: PPO | Source: Ambulatory Visit | Attending: Internal Medicine | Admitting: Internal Medicine

## 2015-12-15 ENCOUNTER — Ambulatory Visit (HOSPITAL_COMMUNITY)
Admission: RE | Admit: 2015-12-15 | Discharge: 2015-12-15 | Disposition: A | Discharge status: Home / Self Care | Payer: PPO | Source: Ambulatory Visit | Attending: Internal Medicine | Admitting: Internal Medicine

## 2015-12-15 DIAGNOSIS — R911 Solitary pulmonary nodule: Secondary | ICD-10-CM

## 2015-12-15 DIAGNOSIS — E041 Nontoxic single thyroid nodule: Secondary | ICD-10-CM | POA: Diagnosis not present

## 2015-12-15 DIAGNOSIS — J449 Chronic obstructive pulmonary disease, unspecified: Secondary | ICD-10-CM | POA: Diagnosis not present

## 2015-12-15 LAB — PULMONARY FUNCTION TEST
DL/VA % pred: 80 %
DL/VA: 3.86 ml/min/mmHg/L
DLCO UNC % PRED: 69 %
DLCO unc: 16.88 ml/min/mmHg
FEF 25-75 PRE: 1.03 L/s
FEF 25-75 Post: 1.61 L/sec
FEF2575-%Change-Post: 56 %
FEF2575-%PRED-PRE: 62 %
FEF2575-%Pred-Post: 97 %
FEV1-%Change-Post: 8 %
FEV1-%PRED-POST: 91 %
FEV1-%Pred-Pre: 84 %
FEV1-Post: 1.92 L
FEV1-Pre: 1.77 L
FEV1FVC-%CHANGE-POST: 0 %
FEV1FVC-%Pred-Pre: 94 %
FEV6-%CHANGE-POST: 9 %
FEV6-%PRED-PRE: 91 %
FEV6-%Pred-Post: 99 %
FEV6-POST: 2.66 L
FEV6-Pre: 2.43 L
FEV6FVC-%Change-Post: 1 %
FEV6FVC-%PRED-POST: 104 %
FEV6FVC-%Pred-Pre: 102 %
FVC-%Change-Post: 7 %
FVC-%Pred-Post: 95 %
FVC-%Pred-Pre: 88 %
FVC-Post: 2.69 L
FVC-Pre: 2.49 L
POST FEV6/FVC RATIO: 99 %
PRE FEV1/FVC RATIO: 71 %
Post FEV1/FVC ratio: 72 %
Pre FEV6/FVC Ratio: 98 %
RV % pred: 144 %
RV: 3.32 L
TLC % PRED: 114 %
TLC: 5.76 L

## 2015-12-15 LAB — GLUCOSE, CAPILLARY: GLUCOSE-CAPILLARY: 153 mg/dL — AB (ref 65–99)

## 2015-12-15 MED ORDER — ALBUTEROL SULFATE (2.5 MG/3ML) 0.083% IN NEBU
2.5000 mg | INHALATION_SOLUTION | Freq: Once | RESPIRATORY_TRACT | Status: AC
Start: 2015-12-15 — End: 2015-12-15
  Administered 2015-12-15: 2.5 mg via RESPIRATORY_TRACT

## 2015-12-15 MED ORDER — FLUDEOXYGLUCOSE F - 18 (FDG) INJECTION
9.1000 | Freq: Once | INTRAVENOUS | Status: AC | PRN
  Administered 2015-12-15: 9.1 via INTRAVENOUS

## 2015-12-16 ENCOUNTER — Other Ambulatory Visit: Payer: Self-pay | Admitting: Internal Medicine

## 2015-12-16 DIAGNOSIS — R911 Solitary pulmonary nodule: Secondary | ICD-10-CM

## 2015-12-20 ENCOUNTER — Telehealth: Payer: Self-pay | Admitting: Internal Medicine

## 2015-12-20 ENCOUNTER — Other Ambulatory Visit: Payer: Self-pay | Admitting: Radiology

## 2015-12-20 NOTE — Telephone Encounter (Signed)
Spoke with pt and she wants to know if she needs to keep appt with TP on 12/22/15 since she is scheduled for biopsy per Dr Melvyn Novas on 12/23/15.  Informed pt that we will cancel appt with TP and we will let her know when to f/u after the biopsy procedure on Friday. Pr voiced understanding. Nothing further needed at this time.

## 2015-12-20 NOTE — Progress Notes (Signed)
LMTCB

## 2015-12-20 NOTE — Telephone Encounter (Signed)
Attempted to contact patient, left message for patient to return call.

## 2015-12-22 ENCOUNTER — Ambulatory Visit: Payer: PPO | Admitting: Adult Health

## 2015-12-22 ENCOUNTER — Encounter: Payer: Self-pay | Admitting: Surgery

## 2015-12-22 ENCOUNTER — Other Ambulatory Visit: Payer: Self-pay | Admitting: General Surgery

## 2015-12-22 ENCOUNTER — Other Ambulatory Visit: Payer: Self-pay | Admitting: Internal Medicine

## 2015-12-22 MED ORDER — TIOTROPIUM BROMIDE-OLODATEROL 2.5-2.5 MCG/ACT IN AERS: 2.0000 | INHALATION_SPRAY | Freq: Every day | RESPIRATORY_TRACT | 11 refills | Status: DC

## 2015-12-22 NOTE — Progress Notes (Signed)
Spoke with pt and notified of results per Dr. Wert. Pt verbalized understanding and denied any questions. 

## 2015-12-23 ENCOUNTER — Ambulatory Visit (HOSPITAL_COMMUNITY)
Admission: RE | Admit: 2015-12-23 | Discharge: 2015-12-23 | Disposition: A | Discharge status: Home / Self Care | Payer: PPO | Source: Ambulatory Visit | Attending: Internal Medicine | Admitting: Internal Medicine

## 2015-12-23 ENCOUNTER — Encounter (HOSPITAL_COMMUNITY): Payer: Self-pay

## 2015-12-23 DIAGNOSIS — Z79899 Other long term (current) drug therapy: Secondary | ICD-10-CM | POA: Diagnosis not present

## 2015-12-23 DIAGNOSIS — Z87891 Personal history of nicotine dependence: Secondary | ICD-10-CM | POA: Diagnosis not present

## 2015-12-23 DIAGNOSIS — R221 Localized swelling, mass and lump, neck: Secondary | ICD-10-CM | POA: Insufficient documentation

## 2015-12-23 DIAGNOSIS — R911 Solitary pulmonary nodule: Secondary | ICD-10-CM | POA: Insufficient documentation

## 2015-12-23 DIAGNOSIS — R591 Generalized enlarged lymph nodes: Secondary | ICD-10-CM | POA: Diagnosis not present

## 2015-12-23 DIAGNOSIS — Z7984 Long term (current) use of oral hypoglycemic drugs: Secondary | ICD-10-CM | POA: Diagnosis not present

## 2015-12-23 DIAGNOSIS — Z7982 Long term (current) use of aspirin: Secondary | ICD-10-CM | POA: Insufficient documentation

## 2015-12-23 DIAGNOSIS — R590 Localized enlarged lymph nodes: Secondary | ICD-10-CM | POA: Diagnosis not present

## 2015-12-23 LAB — CBC
HEMATOCRIT: 46.6 % — AB (ref 36.0–46.0)
HEMOGLOBIN: 14.9 g/dL (ref 12.0–15.0)
MCH: 28.7 pg (ref 26.0–34.0)
MCHC: 32 g/dL (ref 30.0–36.0)
MCV: 89.8 fL (ref 78.0–100.0)
Platelets: 280 10*3/uL (ref 150–400)
RBC: 5.19 MIL/uL — ABNORMAL HIGH (ref 3.87–5.11)
RDW: 12.6 % (ref 11.5–15.5)
WBC: 6.2 10*3/uL (ref 4.0–10.5)

## 2015-12-23 LAB — GLUCOSE, CAPILLARY: GLUCOSE-CAPILLARY: 136 mg/dL — AB (ref 65–99)

## 2015-12-23 LAB — PROTIME-INR
INR: 1.03
PROTHROMBIN TIME: 13.5 s (ref 11.4–15.2)

## 2015-12-23 LAB — APTT: APTT: 30 s (ref 24–36)

## 2015-12-23 MED ORDER — FENTANYL CITRATE (PF) 100 MCG/2ML IJ SOLN
INTRAMUSCULAR | Status: AC
  Filled 2015-12-23: qty 2

## 2015-12-23 MED ORDER — MIDAZOLAM HCL 2 MG/2ML IJ SOLN
INTRAMUSCULAR | Status: AC
  Filled 2015-12-23: qty 2

## 2015-12-23 MED ORDER — LIDOCAINE HCL (PF) 1 % IJ SOLN
INTRAMUSCULAR | Status: AC
  Filled 2015-12-23: qty 10

## 2015-12-23 MED ORDER — SODIUM CHLORIDE 0.9 % IV SOLN: INTRAVENOUS | Status: DC

## 2015-12-23 NOTE — Discharge Instructions (Signed)
Needle Biopsy, Care After These instructions give you information about caring for yourself after your procedure. Your doctor may also give you more specific instructions. Call your doctor if you have any problems or questions after your procedure. HOME CARE  Rest as told by your doctor.  Take medicines only as told by your doctor.  There are many different ways to close and cover the biopsy site, including stitches (sutures), skin glue, and adhesive strips. Follow instructions from your doctor about:  How to take care of your biopsy site.  When and how you should change your bandage (dressing).  When you should remove your dressing.  Removing whatever was used to close your biopsy site.  Check your biopsy site every day for signs of infection. Watch for:  Redness, swelling, or pain.  Fluid, blood, or pus. GET HELP IF:  You have a fever.  You have redness, swelling, or pain at the biopsy site, and it lasts longer than a few days.  You have fluid, blood, or pus coming from the biopsy site.  You feel sick to your stomach (nauseous).  You throw up (vomit). GET HELP RIGHT AWAY IF:  You are short of breath.  You have trouble breathing.  Your chest hurts.  You feel dizzy or you pass out (faint).  You have bleeding that does not stop with pressure or a bandage.  You cough up blood.  Your belly (abdomen) hurts.   This information is not intended to replace advice given to you by your health care provider. Make sure you discuss any questions you have with your health care provider.   Document Released: 03/15/2008 Document Revised: 08/17/2014 Document Reviewed: 03/29/2014 Elsevier Interactive Patient Education [employer].

## 2015-12-23 NOTE — Procedures (Signed)
Interventional Radiology Procedure Note  Procedure: US guided biopsy of left submandibular lymph node.  3 x 18G core biopsy.  Findings:  Lymph node is unremarkable by [place].    Complications: None Recommendations:  - Follow up pathology - Do not submerge for 7 days - Routine care   Signed,  Dulcy Fanny. Earleen Newport, DO

## 2015-12-23 NOTE — H&P (Signed)
Chief Complaint: Patient was seen in consultation today for Left submandibular lymph node biopsy at the request of Wert,Michael B  Referring Physician(s): Wert,Michael B  Supervising Physician: Corrie Mckusick  Patient Status: Outpatient  History of Present Illness: Caitlin Oliver is a 75 y.o. female   Pt suffered dizziness and headache 10/2015 Work up revealed LUL mass on CT Lymphadenopathy and Isthmus nodule (11/2015 Isthmus nodule Bx benign) +PET 12/15/15: IMPRESSION: 1.5 cm hypermetabolic left upper lobe pulmonary nodule, consistent with primary bronchogenic carcinoma. No evidence of hypermetabolic thoracic lymph nodes. No evidence of abdominal or pelvic metastatic disease. Two hypermetabolic left cervical level 1B submandibular lymph nodes, largest measuring 11 mm. Differential diagnosis includes reactive lymphadenopathy and metastatic disease. Hypermetabolic left thyroid lobe nodule. Recent needle aspiration biopsy of this nodule shows pathologic diagnosis of benign follicular nodule.  Referred to Dr Melvyn Novas Now scheduled for Left submandibular LN bx  Past Medical History:  Diagnosis Date  . Anxiety   . Carotid artery disease ([place])   . Claudication ([place])   . COPD (chronic obstructive pulmonary disease) ([place])   . Diabetes mellitus   . Dizzy   . GERD (gastroesophageal reflux disease)   . Hypercholesteremia   . Hypertension   . 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 Shortness of breath    with exertion  . Stomach ulcer   . Tobacco abuse     Past Surgical History:  Procedure Laterality Date  . ABDOMINAL HYSTERECTOMY    . APPENDECTOMY    . BREAST REDUCTION SURGERY    . CAROTID ANGIOGRAM N/A 02/25/2013   Procedure: CAROTID ANGIOGRAM;  Surgeon: Lorretta Harp, MD;  [facility]: [facility] CATH LAB;  Service: Cardiovascular;  Laterality: N/A;  . ENDARTERECTOMY Right 03/06/2013   Procedure: ENDARTERECTOMY CAROTID-RIGHT;   Surgeon: Serafina Mitchell, MD;  Location: [place];  Service: Vascular;  Laterality: Right;  . ENDARTERECTOMY Left 05/07/2013   Procedure: LEFT CAROTID ARTERY ENDARTERECTOMY WITH VASCU-GUARD PATCH ANGIOPLASTY ;  Surgeon: Serafina Mitchell, MD;  Location: [place];  Service: Vascular;  Laterality: Left;  . LOWER EXTREMITY ANGIOGRAM N/A 02/25/2013   Procedure: LOWER EXTREMITY ANGIOGRAM;  Surgeon: Lorretta Harp, MD;  Location: [facility] CATH LAB;  Service: Cardiovascular;  Laterality: N/A;  . LOWER EXTREMITY ANGIOGRAM N/A 07/27/2013   Procedure: LOWER EXTREMITY ANGIOGRAM;  Surgeon: Lorretta Harp, MD;  Location: [facility] CATH LAB;  Service: Cardiovascular;  Laterality: N/A;  . PATCH ANGIOPLASTY Right 03/06/2013   Procedure: PATCH ANGIOPLASTY of Right Carotid Artery using Vascu-Guard Patch;  Surgeon: Serafina Mitchell, MD;  Location: [place];  Service: Vascular;  Laterality: Right;  . PERIPHERAL VASCULAR CATHETERIZATION N/A 11/15/2015   Procedure: Aortic Arch Angiography;  Surgeon: Serafina Mitchell, MD;  Location: [place] CV LAB;  Service: Cardiovascular;  Laterality: N/A;  . PERIPHERAL VASCULAR CATHETERIZATION Bilateral 11/15/2015   Procedure: Carotid Angiography;  Surgeon: Serafina Mitchell, MD;  Location: [place] CV LAB;  Service: Cardiovascular;  Laterality: Bilateral;  . PERIPHERAL VASCULAR CATHETERIZATION Left 11/15/2015   Procedure: Upper Extremity Angiography;  Surgeon: Serafina Mitchell, MD;  Location: [place] CV LAB;  Service: Cardiovascular;  Laterality: Left;  . PERIPHERAL VASCULAR CATHETERIZATION Left 11/15/2015   Procedure: Peripheral Vascular Intervention;  Surgeon: Serafina Mitchell, MD;  Location: [place] CV LAB;  Service: Cardiovascular;  Laterality: Left;  subclavian   . SALIVARY GLAND SURGERY     scar tissue removed from left saliva glad  .  TUBAL LIGATION      Allergies: Review of patient's allergies indicates no known allergies.  Medications: Prior to Admission medications   Medication Sig  Start Date End Date Taking? Authorizing Provider  aspirin 81 MG tablet Take 81 mg by mouth daily.   Yes Historical Provider, MD  Biotin (BIOTIN 5000) 5 MG CAPS Take 1 capsule by mouth daily.    Yes Historical Provider, MD  diazepam (VALIUM) 5 MG tablet Take 5 mg by mouth every 12 (twelve) hours. Anxiety   Yes Historical Provider, MD  diltiazem (CARTIA XT) 300 MG 24 hr capsule Take 300 mg by mouth daily.   Yes Historical Provider, MD  fish oil-omega-3 fatty acids 1000 MG capsule Take 1 g by mouth daily.    Yes Historical Provider, MD  HYDROcodone-acetaminophen (NORCO/VICODIN) 5-325 MG tablet Take 1 tablet by mouth every 6 (six) hours as needed for moderate pain.  10/02/15  Yes Historical Provider, MD  metFORMIN (GLUCOPHAGE) 500 MG tablet Take 500 mg by mouth daily with breakfast.    Yes Historical Provider, MD  metoprolol tartrate (LOPRESSOR) 25 MG tablet Take 1 tablet by mouth daily.  11/08/13  Yes Historical Provider, MD  NONFORMULARY OR COMPOUNDED ITEM [facility] compound pharmacy:  Combo Pain #1 - Baclofen 3%, Gabapentin 8%, Aripiprazole 9.3%, Ketorolac 8%, Licocaine 5%, dispense 240grams, apply 1-2 pumps to focal pain area 3-4 times daily, rub in 1-2 minutes, +3Refills 08/29/15  Yes Trula Slade, DPM  ondansetron (ZOFRAN) 4 MG tablet Take 4 mg by mouth every 8 (eight) hours as needed for nausea or vomiting.  08/25/15  Yes Historical Provider, MD  pantoprazole (PROTONIX) 40 MG tablet Take 40 mg by mouth 2 (two) times daily.  12/17/13  Yes Historical Provider, MD  pravastatin (PRAVACHOL) 40 MG tablet Take 40 mg by mouth daily.   Yes Historical Provider, MD  Tiotropium Bromide-Olodaterol (STIOLTO RESPIMAT) 2.5-2.5 MCG/ACT AERS Inhale 2 puffs into the lungs daily. 12/22/15  Yes Tanda Rockers, MD     Family History  Problem Relation Age of Onset  . Stroke Mother   . Hypertension Mother   . Kidney failure Father     Social History   Social History  . Marital status: Widowed    Spouse name: N/A    . Number of children: N/A  . Years of education: N/A   Social History Main Topics  . Smoking status: Former Smoker    Packs/day: 1.50    Years: 56.00    Types: Cigarettes    Quit date: 07/07/2013  . Smokeless tobacco: Former Systems developer    Quit date: 02/25/2013  . Alcohol use No  . Drug use: No  . Sexual activity: No   Other Topics Concern  . None   Social History Narrative  . None    Review of Systems: A 12 point ROS discussed and pertinent positives are indicated in the HPI above.  All other systems are negative.  Review of Systems  Constitutional: Negative for activity change, appetite change, fatigue and fever.  Respiratory: Negative for shortness of breath.   Gastrointestinal: Negative for abdominal pain.  Neurological: Positive for dizziness and headaches. Negative for weakness.  Psychiatric/Behavioral: Negative for behavioral problems and confusion.    Vital Signs: BP (!) 171/93   Pulse (!) 114   Temp 98.5 F (36.9 C)   Resp 20   Ht 5' 4" (1.626 m)   Wt 184 lb (83.5 kg)   SpO2 98%   BMI 31.58 kg/m  Physical Exam  Constitutional: She is oriented to person, place, and time.  Cardiovascular: Normal rate and regular rhythm.   Pulmonary/Chest: Effort normal and breath sounds normal.  Abdominal: Soft. Bowel sounds are normal.  Musculoskeletal: Normal range of motion.  Neurological: She is alert and oriented to person, place, and time.  Skin: Skin is warm and dry.  Psychiatric: She has a normal mood and affect. Her behavior is normal. Judgment and thought content normal.  Nursing note and vitals reviewed.   Mallampati Score:  MD Evaluation Airway: WNL Heart: WNL Abdomen: WNL Chest/ Lungs: WNL ASA  Classification: 3 Mallampati/Airway Score: One  Imaging: Nm Pet Image Initial (pi) Skull Base To Thigh  Result Date: 12/15/2015 CLINICAL DATA:  Initial treatment strategy for left upper lobe pulmonary nodule. EXAM: NUCLEAR MEDICINE PET SKULL BASE TO THIGH  TECHNIQUE: 9.1 mCi F-18 FDG was injected intravenously. Full-ring PET imaging was performed from the skull base to thigh after the radiotracer. CT data was obtained and used for attenuation correction and anatomic localization. FASTING BLOOD GLUCOSE:  Value: 153 mg/dl COMPARISON:  Chest CT on 11/07/2015 FINDINGS: NECK Two left level 1 B submandibular lymph nodes are seen with hypermetabolic activity. Largest measures 11 mm on image 34/4 with SUV max of 8.8. No other hypermetabolic cervical lymph nodes identified. Hypermetabolic activity is seen corresponding to left thyroid lobe nodule, which has SUV max of 4.2. Aortic atherosclerosis and coronary artery calcification incidentally noted. CHEST No hypermetabolic mediastinal or hilar nodes. 1.5 cm left upper lobe pulmonary nodule shows mild increase in size since 11/07/2015 exam when it measured 12 mm. This is hypermetabolic with SUV max of 16.1. No other suspicious pulmonary nodules or masses identified. No evidence of pleural effusion. ABDOMEN/PELVIS No abnormal hypermetabolic activity within the liver, pancreas, adrenal glands, or spleen. No hypermetabolic lymph nodes in the abdomen or pelvis. Colonic diverticulosis noted, without evidence of diverticulitis. Prior hysterectomy. Aortic atherosclerosis. Go SKELETON No focal hypermetabolic activity to suggest skeletal metastasis. IMPRESSION: 1.5 cm hypermetabolic left upper lobe pulmonary nodule, consistent with primary bronchogenic carcinoma. No evidence of hypermetabolic thoracic lymph nodes. No evidence of abdominal or pelvic metastatic disease. Two hypermetabolic left cervical level 1B submandibular lymph nodes, largest measuring 11 mm. Differential diagnosis includes reactive lymphadenopathy and metastatic disease. Hypermetabolic left thyroid lobe nodule. Recent needle aspiration biopsy of this nodule shows pathologic diagnosis of benign follicular nodule. Electronically Signed   By: Earle Gell M.D.   On:  12/15/2015 13:30   US Thyroid Biopsy  Result Date: 12/01/2015 INDICATION: Incidental finding of thyroid nodule on CT scan confirmed on recent thyroid ultrasound. EXAM: ULTRASOUND GUIDED NEEDLE ASPIRATE BIOPSY OF THE THYROID GLAND COMPARISON:  Ultrasound on November 24, 2015 MEDICATIONS: 1% lidocaine COMPLICATIONS: None immediate. PROCEDURE: Informed written consent was obtained from the patient after a thorough discussion of the procedural risks, benefits and alternatives. All questions were addressed. Maximal Sterile Barrier Technique was utilized including caps, mask, sterile gowns, sterile gloves, sterile drape, hand hygiene and skin antiseptic. A timeout was performed prior to the initiation of the procedure. Ultrasound was performed to localize and mark an adequate site for the biopsy. The patient was then prepped and draped in a normal sterile fashion. Local anesthesia was provided with 1% lidocaine. Using direct ultrasound guidance, 4 passes were made using 25 gauge needles into the nodule within the isthmus of the thyroid. Ultrasound was used to confirm needle placements on all occasions. Specimens were sent to Pathology for analysis. IMPRESSION: Ultrasound guided needle aspirate biopsy  performed of the isthmus thyroid nodule. Read by: Saverio Danker, PA-C Electronically Signed   By: Aletta Edouard M.D.   On: 12/01/2015 16:17   US Thyroid  Result Date: 11/24/2015 CLINICAL DATA:  Nodule noted by CT EXAM: THYROID ULTRASOUND TECHNIQUE: Ultrasound examination of the thyroid gland and adjacent soft tissues was performed. COMPARISON:  None. FINDINGS: Right thyroid lobe Measurements: 6.7 x 2.7 x 2.5 cm. Multiple solid and cystic nodules are seen throughout the right lobe. 1.0 cm upper pole nodule. 1.3 x 1.1 x 1.7 cm predominately cystic nodule in the mid lobe. 1.2 cm lower pole solid nodule. 1.4 cm lower pole nodule with central punctate calcifications. Left thyroid lobe Measurements: 4.9 x 2.2 x 2.4 cm.  Multiple nodules are scattered throughout the left lobe. Complex predominately cystic nodule in the upper pole measures 1.3 x 1.1 x 0.7 cm. Solid mid lobe nodule measures 1.6 cm. Solid lower pole nodule measures 1.4 cm. Solid lower pole nodule measures 1.5 cm. Isthmus Thickness: 0.6 cm. Large heterogeneous mass measures 2.9 x 1.5 x 2.7 cm. Adjacent smaller nodule measures 1.3 cm. Lymphadenopathy None visualized. IMPRESSION: Multiple bilateral and isthmic nodules are present as described. The dominant nodule is in the isthmus measuring 2.9 cm. Findings meet consensus criteria for biopsy. Ultrasound-guided fine needle aspiration should be considered, as per the consensus statement: Management of Thyroid Nodules Detected at [place]: Society of Radiologists in [place]. Radiology 2005; [id number]. Electronically Signed   By: Marybelle Killings M.D.   On: 11/24/2015 16:24    Labs:  CBC:  Recent Labs  11/15/15 0811 12/23/15 1149  WBC  --  6.2  HGB 16.0* 14.9  HCT 47.0* 46.6*  PLT  --  280    COAGS:  Recent Labs  12/23/15 1149  INR 1.03  APTT 30    BMP:  Recent Labs  11/15/15 0811  NA 141  K 4.0  CL 101  GLUCOSE 147*  BUN 25*  CREATININE 0.70    LIVER FUNCTION TESTS: No results for input(s): BILITOT, AST, ALT, ALKPHOS, PROT, ALBUMIN in the last 8760 hours.  TUMOR MARKERS: No results for input(s): AFPTM, CEA, CA199, CHROMGRNA in the last 8760 hours.  Assessment and Plan:  New finding of LUL mass +PET L submandibular LAN Now scheduled for LAN biopsy Risks and Benefits discussed with the patient including, but not limited to bleeding, infection, damage to adjacent structures or low yield requiring additional tests. All of the patient's questions were answered, patient is agreeable to proceed. Consent signed and in chart.   Thank you for this interesting consult.  I greatly enjoyed meeting JASLIN NOVITSKI and look forward to participating in their  care.  A copy of this report was sent to the requesting provider on this date.  Electronically Signed: Monia Sabal A 12/23/2015, 12:36 PM   I spent a total of  30 Minutes   in face to face in clinical consultation, greater than 50% of which was counseling/coordinating care for left submandibular LAN bx

## 2015-12-23 NOTE — Sedation Documentation (Signed)
Pt states she does not want any sedation, just local anesthesia.

## 2015-12-26 ENCOUNTER — Ambulatory Visit (INDEPENDENT_AMBULATORY_CARE_PROVIDER_SITE_OTHER): Payer: PPO | Admitting: Surgery

## 2015-12-26 ENCOUNTER — Encounter: Payer: Self-pay | Admitting: Surgery

## 2015-12-26 ENCOUNTER — Ambulatory Visit (HOSPITAL_COMMUNITY)
Admission: RE | Admit: 2015-12-26 | Discharge: 2015-12-26 | Disposition: A | Discharge status: Home / Self Care | Payer: PPO | Source: Ambulatory Visit | Attending: Surgery | Admitting: Surgery

## 2015-12-26 VITALS — BP 124/71 | HR 63 | Temp 97.3°F | Resp 16 | Ht 64.0 in | Wt 183.0 lb

## 2015-12-26 DIAGNOSIS — Z01812 Encounter for preprocedural laboratory examination: Secondary | ICD-10-CM | POA: Diagnosis not present

## 2015-12-26 DIAGNOSIS — I708 Atherosclerosis of other arteries: Secondary | ICD-10-CM | POA: Diagnosis not present

## 2015-12-26 DIAGNOSIS — I6523 Occlusion and stenosis of bilateral carotid arteries: Secondary | ICD-10-CM | POA: Diagnosis not present

## 2015-12-26 DIAGNOSIS — I771 Stricture of artery: Secondary | ICD-10-CM

## 2015-12-26 LAB — VAS US CAROTID
LCCADSYS: -126 cm/s
LCCAPDIAS: 14 cm/s
LEFT ECA DIAS: -6 cm/s
LEFT VERTEBRAL DIAS: 10 cm/s
LICADDIAS: -21 cm/s
LICADSYS: -93 cm/s
LICAPDIAS: -60 cm/s
Left CCA dist dias: -21 cm/s
Left CCA prox sys: 90 cm/s
Left ICA prox sys: -270 cm/s
RCCADSYS: -84 cm/s
RCCAPDIAS: 8 cm/s
RCCAPSYS: 104 cm/s
RIGHT CCA MID DIAS: -11 cm/s
RIGHT ECA DIAS: -6 cm/s
RIGHT VERTEBRAL DIAS: 9 cm/s

## 2015-12-26 NOTE — Progress Notes (Signed)
[facility] [place]  Patient name: Caitlin Oliver MRN: [id number] DOB: 01-18-1941 Sex: female  REASON FOR VISIT: follow up  HPI:   The patient returns today for follow-up.  She is status post staged bilateral carotid endarterectomies for asymptomatic high-grade stenosis.  She recently had an episode of transient double vision and vertigo on 10/12/2015.  She is continued to have lightheadedness and the posterior headache.  She underwent CT scan imaging which revealed significant left subclavian stenosis and recurrent left carotid stenosis.  Dr. Erlinda Hong felt her symptoms were consistent with left subclavian steal.  She underwent left subclavian stent on 11/15/2015.  She continued to have symptoms and ultimately underwent occipital nerve block and her headaches and other issues resolved.  The patient is currently being worked up for positive lesions on PET scan.  She recently underwent biopsy.  Results are not back yet.  Past Medical History:  Diagnosis Date  . Anxiety   . Carotid artery disease ([place])   . Claudication ([place])   . COPD (chronic obstructive pulmonary disease) ([place])   . Diabetes mellitus   . Dizzy   . GERD (gastroesophageal reflux disease)   . Hypercholesteremia   . Hypertension   . 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 Shortness of breath    with exertion  . Stomach ulcer   . Tobacco abuse     Family History  Problem Relation Age of Onset  . Stroke Mother   . Hypertension Mother   . Kidney failure Father     SOCIAL HISTORY: Social History  Substance Use Topics  . Smoking status: Former Smoker    Packs/day: 1.50    Years: 56.00    Types: Cigarettes    Quit date: 07/07/2013  . Smokeless tobacco: Former Systems developer    Quit date: 02/25/2013  . Alcohol use No    No Known Allergies  Current Outpatient Prescriptions  Medication Sig Dispense Refill  . aspirin 81 MG  tablet Take 81 mg by mouth daily.    . Biotin (BIOTIN 5000) 5 MG CAPS Take 1 capsule by mouth daily.     . diazepam (VALIUM) 5 MG tablet Take 5 mg by mouth every 12 (twelve) hours. Anxiety    . diltiazem (CARTIA XT) 300 MG 24 hr capsule Take 300 mg by mouth daily.    . fish oil-omega-3 fatty acids 1000 MG capsule Take 1 g by mouth daily.     [name] HYDROcodone-acetaminophen (NORCO/VICODIN) 5-325 MG tablet Take 1 tablet by mouth every 6 (six) hours as needed for moderate pain.     . metFORMIN (GLUCOPHAGE) 500 MG tablet Take 500 mg by mouth daily with breakfast.     . metoprolol tartrate (LOPRESSOR) 25 MG tablet Take 1 tablet by mouth daily.     . NONFORMULARY OR COMPOUNDED ITEM [facility] compound pharmacy:  Combo Pain #1 - Baclofen 3%, Gabapentin 8%, Aripiprazole 0.0%, Ketorolac 8%, Licocaine 5%, dispense 240grams, apply 1-2 pumps to focal pain area 3-4 times daily, rub in 1-2 minutes, +3Refills 240 each 3  . ondansetron (ZOFRAN) 4 MG tablet Take 4 mg by mouth every 8 (eight) hours as needed for nausea or vomiting.     . pantoprazole (PROTONIX) 40 MG tablet Take 40 mg by mouth 2 (two) times daily.     . pravastatin (PRAVACHOL) 40 MG tablet Take 40 mg by mouth daily.    . Tiotropium Bromide-Olodaterol (STIOLTO RESPIMAT) 2.5-2.5 MCG/ACT AERS  Inhale 2 puffs into the lungs daily. 1 Inhaler 11   No current facility-administered medications for this visit.     REVIEW OF SYSTEMS:  _0  denotes positive finding, _1  denotes negative finding Cardiac  Comments:  Chest pain or chest pressure:    Shortness of breath upon exertion: x   Short of breath when lying flat:    Irregular heart rhythm: x       Vascular    Pain in calf, thigh, or hip brought on by ambulation:    Pain in feet at night that wakes you up from your sleep:     Blood clot in your veins:    Leg swelling:         Pulmonary    Oxygen at home:    Productive cough:     Wheezing:         Neurologic    Sudden weakness in arms or legs:      Sudden numbness in arms or legs:     Sudden onset of difficulty speaking or slurred speech:    Temporary loss of vision in one eye:     Problems with dizziness:         Gastrointestinal    Blood in stool:     Vomited blood:         Genitourinary    Burning when urinating:     Blood in urine:        Psychiatric    Major depression:         Hematologic    Bleeding problems:    Problems with blood clotting too easily:        Skin    Rashes or ulcers:        Constitutional    Fever or chills:      PHYSICAL EXAM: Vitals:   12/26/15 1554 12/26/15 1556  BP: 137/72 124/71  Pulse: 63   Resp: 16   Temp: 97.3 F (36.3 C)   TempSrc: Oral   SpO2: 96%   Weight: 183 lb (83 kg)   Height: _2  (1.626 m)     GENERAL: The patient is a well-nourished female, in no acute distress. The vital signs are documented above. CARDIAC: There is a regular rate and rhythm.  VASCULAR: Palpable left radial pulse PULMONARY: There is good air exchange bilaterally without wheezing or rales. MUSCULOSKELETAL: There are no major deformities or cyanosis. NEUROLOGIC: No focal weakness or paresthesias are detected. SKIN: There are no ulcers or rashes noted. PSYCHIATRIC: The patient has a normal affect.  DATA:  Carotid duplex shows 60-79% left recurrent carotid stenosis.  An 1-39 percent right carotid stenosis.  Both vertebral arteries are antegrade  MEDICAL ISSUES: Carotid stenosis: The patient has elevated velocities on the left.  She will have a repeat carotid ultrasound in 6 months.  Subclavian stenosis: Status post left subclavian stent.  She now has antegrade vertebral artery blood flow.  This will be followed up with during her carotid ultrasound.    Annamarie Major, MD [facility] [phone] Pager [phone]

## 2015-12-27 ENCOUNTER — Other Ambulatory Visit: Payer: Self-pay | Admitting: Internal Medicine

## 2015-12-27 DIAGNOSIS — R911 Solitary pulmonary nodule: Secondary | ICD-10-CM

## 2015-12-28 ENCOUNTER — Telehealth: Payer: Self-pay | Admitting: Neurology

## 2015-12-28 NOTE — Telephone Encounter (Signed)
Pt called said she is having pain in her head, she took a pain pill at 10am today but the pain is getting worse. She is wanting to know if something could be called in.

## 2015-12-28 NOTE — Telephone Encounter (Signed)
Rn call patient back about having pain in her head. Pt stated she took a pain pill at 10am. But it gave her no relief. Rn stated Dr. Erlinda Hong in working in the hospital this week. Pt was prescribed a norco by another MD. Rn stated a message will be sent to Dr. Erlinda Hong. Pt verbalized understanding. Dr. Erlinda Hong has given patient a occipital nerve block in the last month.

## 2015-12-28 NOTE — Telephone Encounter (Signed)
Discussed patient over the phone. She stated that since she got occipital nerve block 1 months ago, she had no bad headache until today she started to have same headache as before. She took pain medication including oxycodone, but it did not help. I think she probably need another occipital nerve block again. She agrees.  I told her to come in next Monday 2:30pm for occipital nerve block. She expressed understanding and appreciation.  Hi, Katrina, please book the patient next Monday 2:30 p.m for bilateral occipital nerve block. Thanks  Rosalin Hawking, MD PhD Stroke Neurology 12/28/2015 6:35 PM

## 2015-12-29 NOTE — Telephone Encounter (Signed)
Rn spoke with patient that she was schedule for 01/02/2016 for occipital nerve block. Pt verbalized understanding. Pt will be here on Monday.

## 2016-01-02 ENCOUNTER — Ambulatory Visit (INDEPENDENT_AMBULATORY_CARE_PROVIDER_SITE_OTHER): Payer: PPO | Admitting: Neurology

## 2016-01-02 ENCOUNTER — Encounter: Payer: Self-pay | Admitting: Neurology

## 2016-01-02 ENCOUNTER — Encounter: Payer: PPO | Admitting: Thoracic Surgery (Cardiothoracic Vascular Surgery)

## 2016-01-02 VITALS — BP 100/62 | HR 70 | Ht 64.0 in | Wt 183.2 lb

## 2016-01-02 DIAGNOSIS — M5481 Occipital neuralgia: Secondary | ICD-10-CM | POA: Diagnosis not present

## 2016-01-03 NOTE — Progress Notes (Signed)
Procedure Date:  01/02/16 Procedure: Occipital Nerve Block, bi  Pre-procedure Diagnosis: Headache  Post-procedure Diagnosis: same as above  Prior to Procedure:  Informed Consent: The risks, benefits, indications, potential complications, and alternatives were explained to the patient/family and informed consent obtained.  Attending Staff:  Katharine Look, RN Resident/Fellow: None Skin Prep: Cleansed with alcohol.  Anesthesia: 45m Lidocaine 1% without epinephrine without added sodium bicarbonate, decadron 113m(13m52mIndications: [age] female here for headache. The identity of the patient was confirmed and a bedside time out was performed.  Description of Procedure: Pt's skin was prepped with alcohol and lidocaine 1% and decadron 13mg28m was injected over the occipital ridge in a fan-like fashion with appropriate procedure bilaterally. Pt had immediate relief.  Findings: immediate HA relief  Complications: The patient tolerated the procedure well with no complications.  Specimens:none  Estimated blood loss: zero   JindRosalin Hawking PhD Stroke Neurology 01/03/2016 6:41 AM

## 2016-01-10 ENCOUNTER — Institutional Professional Consult (permissible substitution) (INDEPENDENT_AMBULATORY_CARE_PROVIDER_SITE_OTHER): Payer: PPO | Admitting: Thoracic Surgery (Cardiothoracic Vascular Surgery)

## 2016-01-10 VITALS — BP 116/70 | HR 70 | Resp 18 | Ht 64.0 in | Wt 183.0 lb

## 2016-01-10 DIAGNOSIS — R911 Solitary pulmonary nodule: Secondary | ICD-10-CM

## 2016-01-10 NOTE — Progress Notes (Signed)
PCP is Bing Matter, PA-C Referring Provider is Tanda Rockers, MD  Chief Complaint  Patient presents with  . Lung Lesion    Surgical eval, PET Scan 12/15/15, Chest CT 11/07/15, PFT's 12/15/15, US-guided core biopsy 12/23/15    HPI: 75 year old woman sent for consultation regarding a left upper lobe lung nodule.  Caitlin Oliver is a 75 year old woman with a past medical history of atherosclerotic cardiovascular disease (carotid disease, subclavian stenosis, bilateral iliac artery stenoses), hypertension, hypercholesterolemia, tobacco abuse, COPD, gastroesophageal reflux, peptic ulcer disease, anxiety, and type 2 diabetes without complication.  She was in her usual state of health until June. She was in a store and looked down when she looked back up her vision was blurry and she was dizzy. Since then she's been having severe headaches and nausea. A CTA of the head and neck showed stenosis of the right common carotid, distal right internal carotid, left ICA, and the left subclavian artery. She was thought to have subclavian steal. She was referred to Dr. Trula Slade who did a left subclavian stent. The procedure was successful but did not relieve her headaches, dizziness, and nausea. She was diagnosed with bilateral occipital neuralgia and has had nerve blocks of the occipital nerve. Blocks do relieve her symptoms but only temporarily.  During her evaluation she had a CT angiogram of the head and neck. A left upper lobe nodule was noted. A chest CT with contrast was done on 11/07/2015. It showed a 1.2 cm nodule. A PET/CT showed the nodule was hypermetabolic with an SUV of 56.4. She also was found to have hypermetabolic thyroid nodule. Biopsy of that showed benign follicular nodule. She also had hypermetabolic submandibular lymph nodes. Needle aspirations of those was nondiagnostic.  She saw Dr. Melvyn Novas and he has referred her for consideration for surgery. He did start her on Stiolto. She hasn't noticed any  significant change since starting the inhaler. She gets short of breath with exertion, but says she can walk up a flight of stairs without problem. Her walking is somewhat limited by claudication in her legs. She denies any chest pain, pressure, tightness, or burning at rest or with exertion. She is not aware of any wheezing. She has occasional cough which is not changed. She denies hemoptysis. She denies weight loss and in fact has gained about 10 pounds over the past 3 months. She lives alone and manages her own household. She smoked about 1.5 ppd for 50 years prior to quitting in 2015.  Zubrod Score: At the time of surgery this patient's most appropriate activity status/level should be described as: _0     0    Normal activity, no symptoms _1     1    Restricted in physical strenuous activity but ambulatory, able to do out light work _2     2    Ambulatory and capable of self care, unable to do work activities, up and about >50 % of waking hours                              _3     3    Only limited self care, in bed greater than 50% of waking hours _4     4    Completely disabled, no self care, confined to bed or chair _5     5    Moribund    Past Medical History:  Diagnosis Date  . Anxiety   . Carotid artery disease ([place])   .  Claudication ([place])   . COPD (chronic obstructive pulmonary disease) ([place])   . Diabetes mellitus   . Dizzy   . GERD (gastroesophageal reflux disease)   . Hypercholesteremia   . Hypertension   . 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 Shortness of breath    with exertion  . Stomach ulcer   . Tobacco abuse     Past Surgical History:  Procedure Laterality Date  . ABDOMINAL HYSTERECTOMY    . APPENDECTOMY    . BREAST REDUCTION SURGERY    . CAROTID ANGIOGRAM N/A 02/25/2013   Procedure: CAROTID ANGIOGRAM;  Surgeon: Lorretta Harp, MD;  Location: [facility] CATH LAB;  Service: Cardiovascular;  Laterality: N/A;  .  ENDARTERECTOMY Right 03/06/2013   Procedure: ENDARTERECTOMY CAROTID-RIGHT;  Surgeon: Serafina Mitchell, MD;  Location: [place];  Service: Vascular;  Laterality: Right;  . ENDARTERECTOMY Left 05/07/2013   Procedure: LEFT CAROTID ARTERY ENDARTERECTOMY WITH VASCU-GUARD PATCH ANGIOPLASTY ;  Surgeon: Serafina Mitchell, MD;  Location: [place];  Service: Vascular;  Laterality: Left;  . LOWER EXTREMITY ANGIOGRAM N/A 02/25/2013   Procedure: LOWER EXTREMITY ANGIOGRAM;  Surgeon: Lorretta Harp, MD;  Location[facility] CATH LAB;  Service: Cardiovascular;  Laterality: N/A;  . LOWER EXTREMITY ANGIOGRAM N/A 07/27/2013   Procedure: LOWER EXTREMITY ANGIOGRAM;  Surgeon: Lorretta Harp, MD;  Location[facility] LAB;  Service: Cardiovascular;  Laterality: N/A;  . PATCH ANGIOPLASTY Right 03/06/2013   Procedure: PATCH ANGIOPLASTY of Right Carotid Artery using Vascu-Guard Patch;  Surgeon: Serafina Mitchell, MD;  Location: [place];  Service: Vascular;  Laterality: Right;  . PERIPHERAL VASCULAR CATHETERIZATION N/A 11/15/2015   Procedure: Aortic Arch Angiography;  Surgeon: Serafina Mitchell, MD;  Location: [place] CV LAB;  Service: Cardiovascular;  Laterality: N/A;  . PERIPHERAL VASCULAR CATHETERIZATION Bilateral 11/15/2015   Procedure: Carotid Angiography;  Surgeon: Serafina Mitchell, MD;  Location: [place] CV LAB;  Service: Cardiovascular;  Laterality: Bilateral;  . PERIPHERAL VASCULAR CATHETERIZATION Left 11/15/2015   Procedure: Upper Extremity Angiography;  Surgeon: Serafina Mitchell, MD;  Location: [place] CV LAB;  Service: Cardiovascular;  Laterality: Left;  . PERIPHERAL VASCULAR CATHETERIZATION Left 11/15/2015   Procedure: Peripheral Vascular Intervention;  Surgeon: Serafina Mitchell, MD;  Location: [place] CV LAB;  Service: Cardiovascular;  Laterality: Left;  subclavian   . SALIVARY GLAND SURGERY     scar tissue removed from left saliva glad  . TUBAL LIGATION      Family History  Problem Relation Age of Onset  . Stroke Mother    . Hypertension Mother   . Kidney failure Father     Social History Social History  Substance Use Topics  . Smoking status: Former Smoker    Packs/day: 1.50    Years: 56.00    Types: Cigarettes    Quit date: 07/07/2013  . Smokeless tobacco: Former Systems developer    Quit date: 02/25/2013  . Alcohol use No    Current Outpatient Prescriptions  Medication Sig Dispense Refill  . aspirin 81 MG tablet Take 81 mg by mouth daily.    . Biotin (BIOTIN 5000) 5 MG CAPS Take 1 capsule by mouth daily.     . diazepam (VALIUM) 5 MG tablet Take 5 mg by mouth every 12 (twelve) hours. Anxiety    . diltiazem (CARTIA XT) 300 MG 24 hr capsule Take 300 mg by mouth daily.    . fish oil-omega-3 fatty acids 1000 MG capsule  Take 1 g by mouth daily.     [name] HYDROcodone-acetaminophen (NORCO/VICODIN) 5-325 MG tablet Take 1 tablet by mouth every 6 (six) hours as needed for moderate pain.     . metFORMIN (GLUCOPHAGE) 500 MG tablet Take 500 mg by mouth daily with breakfast.     . metoprolol tartrate (LOPRESSOR) 25 MG tablet Take 1 tablet by mouth daily.     . NONFORMULARY OR COMPOUNDED ITEM [facility]:  [facility] - Baclofen 3%, Gabapentin 8%, Aripiprazole 9.1%, Ketorolac 8%, Licocaine 5%, dispense 240grams, apply 1-2 pumps to focal pain area 3-4 times daily, rub in 1-2 minutes, +3Refills 240 each 3  . ondansetron (ZOFRAN) 4 MG tablet Take 4 mg by mouth every 8 (eight) hours as needed for nausea or vomiting.     . pantoprazole (PROTONIX) 40 MG tablet Take 40 mg by mouth 2 (two) times daily.     . pravastatin (PRAVACHOL) 40 MG tablet Take 40 mg by mouth daily.    . Tiotropium Bromide-Olodaterol (STIOLTO RESPIMAT) 2.5-2.5 MCG/ACT AERS Inhale 2 puffs into the lungs daily. 1 Inhaler 11   No current facility-administered medications for this visit.     No Known Allergies  Review of Systems  Constitutional: Positive for fatigue and unexpected weight change (Gained 10 pounds). Negative for activity change and  appetite change.  HENT: Negative for trouble swallowing and voice change.   Eyes: Positive for photophobia and visual disturbance.  Respiratory: Positive for shortness of breath. Negative for cough and wheezing.   Cardiovascular: Positive for leg swelling. Negative for chest pain and palpitations.       Claudication  Gastrointestinal: Positive for nausea. Negative for blood in stool.  Genitourinary: Negative for difficulty urinating and dysuria.  Musculoskeletal: Negative for arthralgias and joint swelling.  Neurological: Positive for dizziness and headaches. Negative for syncope and weakness.  Hematological: Positive for adenopathy (Submandibular). Bruises/bleeds easily.  Psychiatric/Behavioral: The patient is nervous/anxious.   All other systems reviewed and are negative.   BP 116/70 (BP Location: Right Arm, Patient Position: Sitting, Cuff Size: Normal)   Pulse 70   Resp 18   Ht _0  (1.626 m)   Wt 183 lb (83 kg)   SpO2 97% Comment: RA  BMI 31.41 kg/m  Physical Exam  Constitutional: She is oriented to person, place, and time. She appears well-developed and well-nourished. No distress.  Obese  HENT:  Head: Normocephalic and atraumatic.  Mouth/Throat: No oropharyngeal exudate.  Eyes: Conjunctivae and EOM are normal. No scleral icterus.  Neck: Neck supple. No thyromegaly present.  Right greater than left carotid bruit  Cardiovascular: Normal rate, regular rhythm and normal heart sounds.  Exam reveals no gallop and no friction rub.   No murmur heard. Pulmonary/Chest: Effort normal and breath sounds normal. No respiratory distress. She has no wheezes. She has no rales.  Abdominal: Soft. She exhibits no distension. There is no tenderness. There is no rebound.  Musculoskeletal: Normal range of motion.  Lymphadenopathy:    She has no cervical adenopathy.  Neurological: She is alert and oriented to person, place, and time. No cranial nerve deficit. She exhibits normal muscle tone.   Motor grossly intact  Skin: Skin is warm and dry.  Vitals reviewed.    Diagnostic Tests: CT CHEST WITH CONTRAST TECHNIQUE: Multidetector CT imaging of the chest was performed during intravenous contrast administration. CONTRAST:  31m ISOVUE-300 IOPAMIDOL (ISOVUE-300) INJECTION 61% COMPARISON:  10/20/2015 FINDINGS: Cardiovascular: Heart is normal size. Coronary artery and aortic calcifications, moderate to  advanced. Aorta is normal caliber. Mediastinum/Nodes: No mediastinal, hilar, or axillary adenopathy. Multiple thyroid nodules, the largest 3.1 cm in the left thyroid lobe. Lungs/Pleura: 12 mm nodule in the left upper lobe is unchanged since prior CT. No additional suspicious pulmonary nodules or areas of confluent airspace disease. No effusions. Upper Abdomen: Imaging into the upper abdomen shows no acute findings. Musculoskeletal: Chest wall soft tissues are unremarkable. No acute bony abnormality or focal bone lesion. IMPRESSION: 12 mm left upper lobe lobular pulmonary nodule. This is nonspecific. Cannot exclude malignancy. This could be further evaluated with PET CT to assess metabolic activity. Multiple thyroid nodules, likely multinodular goiter. This could be further evaluated with thyroid ultrasound. Moderate coronary artery disease, aortic atherosclerosis. Electronically Signed   By: Rolm Baptise M.D.   On: 11/07/2015 11:09 NUCLEAR MEDICINE PET SKULL BASE TO THIGH  TECHNIQUE: 9.1 mCi F-18 FDG was injected intravenously. Full-ring PET imaging was performed from the skull base to thigh after the radiotracer. CT data was obtained and used for attenuation correction and anatomic localization.  FASTING BLOOD GLUCOSE:  Value: 153 mg/dl  COMPARISON:  Chest CT on 11/07/2015  FINDINGS: NECK  Two left level 1 B submandibular lymph nodes are seen with hypermetabolic activity. Largest measures 11 mm on image 34/4 with SUV max of 8.8. No other hypermetabolic  cervical lymph nodes identified.  Hypermetabolic activity is seen corresponding to left thyroid lobe nodule, which has SUV max of 4.2.  Aortic atherosclerosis and coronary artery calcification incidentally noted.  CHEST  No hypermetabolic mediastinal or hilar nodes.  1.5 cm left upper lobe pulmonary nodule shows mild increase in size since 11/07/2015 exam when it measured 12 mm. This is hypermetabolic with SUV max of 41.3. No other suspicious pulmonary nodules or masses identified. No evidence of pleural effusion.  ABDOMEN/PELVIS  No abnormal hypermetabolic activity within the liver, pancreas, adrenal glands, or spleen. No hypermetabolic lymph nodes in the abdomen or pelvis.  Colonic diverticulosis noted, without evidence of diverticulitis. Prior hysterectomy. Aortic atherosclerosis. Go  SKELETON  No focal hypermetabolic activity to suggest skeletal metastasis.  IMPRESSION: 1.5 cm hypermetabolic left upper lobe pulmonary nodule, consistent with primary bronchogenic carcinoma.  No evidence of hypermetabolic thoracic lymph nodes. No evidence of abdominal or pelvic metastatic disease.  Two hypermetabolic left cervical level 1B submandibular lymph nodes, largest measuring 11 mm. Differential diagnosis includes reactive lymphadenopathy and metastatic disease.  Hypermetabolic left thyroid lobe nodule. Recent needle aspiration biopsy of this nodule shows pathologic diagnosis of benign follicular nodule.   Electronically Signed   By: Earle Gell M.D.   On: 12/15/2015 13:30  Pulmonary function testing 12/15/2015 FVC equals 2.49 (88%) FEV1 1.77 (84%) FEV1 1.92 (91%) post bronchodilator DLCO 16.88 (69%)  I personally reviewed the CT and PET/CT and concur with the findings noted above.  Impression: Caitlin Oliver is a 75 year old former smoker who has an incidentally discovered 1.5 cm left upper lobe nodule that is markedly hypermetabolic by PET CT with  SUV of 14. This is highly suspicious for a primary bronchogenic carcinoma, and has to be considered that unless it can be proven otherwise.  I had a long discussion with Caitlin Oliver. I reviewed the CT and PET CT with her. We discussed the differential diagnosis, but was clear that this is far away more likely to be a lung cancer than anything else. The nodule has already grown in the time period between her CT and PET/CT. I discussed the potential diagnostic and treatment algorithms with her. She  understands that the gold standard for treatment of this nodule would be surgical resection. She has adequate lung function to tolerate a lobectomy should that be necessary. That would give her the best chance of cure. An alternative treatment would be stereotactic radiation. That has good results with achieving local control but is nowhere near as likely to cure her as a lobectomy would be. We also discussed the utility of a navigational bronchoscopy for biopsy prior to intervention. She certainly would be that if she opted for radiation. If she is willing to go through with surgery I would skip that step, as if it is positive she would need surgery and if it was negative for cannot be relied on to rule out cancer definitively.  I described the general nature of the proposed procedure which is a left VATS, wedge resection, left upper lobectomy (possible lingular sparing). She understands this would be done in the operating room under general anesthesia. I described the general nature of the procedure to her including the incisions to be used, drainage tubes postoperatively, the expected hospital stay, and the overall recovery. She understands no guarantee of cure can be given with any form of treatment. I informed her the indications, risks, benefits, and alternatives. She understands the risk include, but are not limited to death, MI, DVT, PE, bleeding, possible need for transfusion, infection, prolonged air leak,  cardiac arrhythmias, as well as the possibility of other unforeseeable complications.   She feels very poorly at present complains of headache and nausea. She does not want to make a decision at this time. I encouraged her to take her time and think over her options. I will plan to see her back in about 2 weeks to talk with her further.  Plan: Return in 2 weeks  [name], MD [facility] [phone]

## 2016-01-12 ENCOUNTER — Telehealth: Payer: Self-pay | Admitting: Neurology

## 2016-01-12 MED ORDER — TIZANIDINE HCL 2 MG PO TABS: 2.0000 mg | ORAL_TABLET | Freq: Two times a day (BID) | ORAL | 1 refills | Status: DC

## 2016-01-12 NOTE — Telephone Encounter (Signed)
Pt had occipital nerve block on 01/02/16 by Dr. Erlinda Hong.

## 2016-01-12 NOTE — Telephone Encounter (Signed)
Pt called in stating she is still swimmy headed and dizzy even after having shots. Please call [phone]

## 2016-01-12 NOTE — Telephone Encounter (Signed)
I called patient, left a message, I will call back later.

## 2016-01-12 NOTE — Telephone Encounter (Signed)
I called patient. The patient is having pain in the back of head, neck pain, pain into the temporal areas and frontal areas bilaterally. She is having dizziness with this, the headache problem and the dizziness parallel one another. The patient got improvement with occipital nerve injections initially, a second round of injections did not help. Headache has been more significant since June 2017.  The patient could have cervicogenic headache, I will try tizanidine, I will call in a prescription for low-dose of the medication.

## 2016-01-12 NOTE — Telephone Encounter (Signed)
Pt returned call. I relayed to her Dr. Jannifer Franklin will call her back later. She expressed understanding.

## 2016-01-13 NOTE — Telephone Encounter (Signed)
Thanks much, Dr. Jannifer Franklin.  Caitlin Hawking, MD PhD Stroke Neurology 01/13/2016 4:24 PM

## 2016-01-17 DIAGNOSIS — Z23 Encounter for immunization: Secondary | ICD-10-CM | POA: Diagnosis not present

## 2016-01-24 ENCOUNTER — Other Ambulatory Visit: Payer: Self-pay | Admitting: *Deleted

## 2016-01-24 ENCOUNTER — Encounter: Payer: Self-pay | Admitting: Thoracic Surgery (Cardiothoracic Vascular Surgery)

## 2016-01-24 ENCOUNTER — Ambulatory Visit (INDEPENDENT_AMBULATORY_CARE_PROVIDER_SITE_OTHER): Payer: PPO | Admitting: Thoracic Surgery (Cardiothoracic Vascular Surgery)

## 2016-01-24 VITALS — BP 110/64 | HR 66 | Resp 15

## 2016-01-24 DIAGNOSIS — D491 Neoplasm of unspecified behavior of respiratory system: Secondary | ICD-10-CM | POA: Diagnosis not present

## 2016-01-24 DIAGNOSIS — R911 Solitary pulmonary nodule: Secondary | ICD-10-CM

## 2016-01-24 NOTE — Progress Notes (Signed)
[address]       [place] [address]             [phone]       HPI: Mrs. Zuniga returns to further discuss management of her left upper lobe lung nodule.  She is a 75 year old former smoker who has a past medical history of arthroscopic cardiovascular disease, hypertension, hypercholesterolemia, tobacco abuse, COPD, reflux, peptic ulcer disease, gastrointestinal bleed, anxiety, and type 2 diabetes. She had an episode in June where she developed severe headaches nausea and dizziness. A CT of the head and neck showed stenosis of the right common carotid, distal right internal carotid, left ICA, and the left subclavian. She was thought to have subclavian steal. A subclavian stent was placed. The procedure was successful, but did not affect her headaches, dizziness, and nausea.  An incidental finding on the CT of the head and neck was a left upper lobe lung nodule. A CT chest was done 11/07/2015 that showed a 1.2 cm nodule. On PET CT this nodule was hypermetabolic with SUV of 14. She saw Dr. Melvyn Novas and was started on [facility] and referred for surgery.  I saw on 01/10/2016. I discussed the options of surgical resection versus stereotactic radiation. We discussed the relative advantages and disadvantages of each approach. We discussed the possibility of a bronchoscopic biopsy. My recommendation was to proceed with left VATS, wedge resection, possible left upper lobectomy. My index suspicion with the lesion. Because the possibility of a false negative result with bronchoscopy, I did not recommend that procedure prior to surgery. He was very anxious about possible surgery returns today with multiple questions.  In the interim since her last visit she's been feeling well. Her shortness of breath with exertion is unchanged. She has not had any chest pain, pressure, tightness, or burning. She denies wheezing.  Past Medical History:  Diagnosis Date  . Anxiety   . Carotid artery  disease ([place])   . Claudication ([place])   . COPD (chronic obstructive pulmonary disease) ([place])   . Diabetes mellitus   . Dizzy   . GERD (gastroesophageal reflux disease)   . Hypercholesteremia   . Hypertension   . 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 Shortness of breath    with exertion  . Stomach ulcer   . Tobacco abuse    Past Surgical History:  Procedure Laterality Date  . ABDOMINAL HYSTERECTOMY    . APPENDECTOMY    . BREAST REDUCTION SURGERY    . CAROTID ANGIOGRAM N/A 02/25/2013   Procedure: CAROTID ANGIOGRAM;  Surgeon: Lorretta Harp, MD;  Location: [facility] CATH LAB;  Service: Cardiovascular;  Laterality: N/A;  . ENDARTERECTOMY Right 03/06/2013   Procedure: ENDARTERECTOMY CAROTID-RIGHT;  Surgeon: Serafina Mitchell, MD;  Location: [place];  Service: Vascular;  Laterality: Right;  . ENDARTERECTOMY Left 05/07/2013   Procedure: LEFT CAROTID ARTERY ENDARTERECTOMY WITH VASCU-GUARD PATCH ANGIOPLASTY ;  Surgeon: Serafina Mitchell, MD;  Location: [place];  Service: Vascular;  Laterality: Left;  . LOWER EXTREMITY ANGIOGRAM N/A 02/25/2013   Procedure: LOWER EXTREMITY ANGIOGRAM;  Surgeon: Lorretta Harp, MD;  Location: [facility] CATH LAB;  Service: Cardiovascular;  Laterality: N/A;  . LOWER EXTREMITY ANGIOGRAM N/A 07/27/2013   Procedure: LOWER EXTREMITY ANGIOGRAM;  Surgeon: Lorretta Harp, MD;  Location: [facility] CATH LAB;  Service: Cardiovascular;  Laterality: N/A;  . PATCH ANGIOPLASTY Right 03/06/2013   Procedure: PATCH ANGIOPLASTY of Right Carotid  Artery using Vascu-Guard Patch;  Surgeon: Serafina Mitchell, MD;  Location: [place];  Service: Vascular;  Laterality: Right;  . PERIPHERAL VASCULAR CATHETERIZATION N/A 11/15/2015   Procedure: Aortic Arch Angiography;  Surgeon: Serafina Mitchell, MD;  Location: [place] CV LAB;  Service: Cardiovascular;  Laterality: N/A;  . PERIPHERAL VASCULAR CATHETERIZATION Bilateral 11/15/2015   Procedure: Carotid Angiography;  Surgeon:  Serafina Mitchell, MD;  Location: [place] CV LAB;  Service: Cardiovascular;  Laterality: Bilateral;  . PERIPHERAL VASCULAR CATHETERIZATION Left 11/15/2015   Procedure: Upper Extremity Angiography;  Surgeon: Serafina Mitchell, MD;  Location: [place] CV LAB;  Service: Cardiovascular;  Laterality: Left;  . PERIPHERAL VASCULAR CATHETERIZATION Left 11/15/2015   Procedure: Peripheral Vascular Intervention;  Surgeon: Serafina Mitchell, MD;  Location: [place] CV LAB;  Service: Cardiovascular;  Laterality: Left;  subclavian   . SALIVARY GLAND SURGERY     scar tissue removed from left saliva glad  . TUBAL LIGATION        Current Outpatient Prescriptions  Medication Sig Dispense Refill  . aspirin 81 MG tablet Take 81 mg by mouth daily.    . Biotin (BIOTIN MAXIMUM STRENGTH) 5 MG CAPS Take 1 capsule by mouth daily.    . diazepam (VALIUM) 5 MG tablet Take 5 mg by mouth every 12 (twelve) hours. Anxiety    . diltiazem (CARTIA XT) 300 MG 24 hr capsule Take 300 mg by mouth daily.    . fish oil-omega-3 fatty acids 1000 MG capsule Take 1 g by mouth daily.     [name] HYDROcodone-acetaminophen (NORCO/VICODIN) 5-325 MG tablet Take 1 tablet by mouth every 6 (six) hours as needed for moderate pain.     . metFORMIN (GLUCOPHAGE) 500 MG tablet Take 500 mg by mouth daily with breakfast.     . metoprolol tartrate (LOPRESSOR) 25 MG tablet Take 1 tablet by mouth daily.     . NONFORMULARY OR COMPOUNDED ITEM [facility]:  [facility] - Baclofen 3%, Gabapentin 8%, Aripiprazole 2.3%, Ketorolac 8%, Licocaine 5%, dispense 240grams, apply 1-2 pumps to focal pain area 3-4 times daily, rub in 1-2 minutes, +3Refills 240 each 3  . ondansetron (ZOFRAN) 4 MG tablet Take 4 mg by mouth every 8 (eight) hours as needed for nausea or vomiting.     . pantoprazole (PROTONIX) 40 MG tablet Take 40 mg by mouth 2 (two) times daily.     . pravastatin (PRAVACHOL) 40 MG tablet Take 40 mg by mouth daily.    . Tiotropium  Bromide-Olodaterol (STIOLTO RESPIMAT) 2.5-2.5 MCG/ACT AERS Inhale 2 puffs into the lungs daily. 1 Inhaler 11  . tiZANidine (ZANAFLEX) 2 MG tablet Take 1 tablet (2 mg total) by mouth 2 (two) times daily. 60 tablet 1   No current facility-administered medications for this visit.     Physical Exam BP 110/64   Pulse 66   Resp 15   Ht (P) _0  (1.626 m)   Wt (P) 183 lb (83 kg)   SpO2 96% Comment: ON RA  BMI (P) 31.41 kg/m  Anxious 75 year old woman in no acute distress Alert and oriented 3, slurred speech, no focal motor deficit No cervical or supraclavicular adenopathy Lungs clear with equal size bilaterally Cardiac regular rate and rhythm normal S1 and S2 Abdomen soft nontender Sugars are without clubbing cyanosis or edema  Diagnostic Tests: I again reviewed the CT and PET/CT and her pulmonary function testing findings as noted in my previous note from 01/10/2016. She has 1.2  cm hypermetabolic nodule in the left upper lobe. She has adequate pulmonary function to tolerate a resection.  Impression: 75 year old woman with a history of tobacco abuse who has a hypermetabolic 1.2 cm nodule in the left upper lobe. This is highly suspicious for primary bronchogenic carcinoma. Other items in the differential included infectious or inflammatory nodules.  I again had a lengthy discussion with Mrs. Kunda regarding the nodule. They understand this has to be considered a lung cancer unless it can be proven otherwise. We again discussed the relative advantages and disadvantages of surgery versus stereotactic radiation as a primary mode of treatment. We again discussed the issues related to bronchoscopy prior to surgery. Certainly that is an option, but in order to make a definitive diagnosis, I think a wedge resection followed by a lobectomy at the same setting if its cancer is her best option.  I had previously discussed the indications, risks, benefits, and alternatives with her. She understands  the risks as outlined in my previous note from 01/10/2016. She does not have any questions about those risks. She did have some questions about pain management, incisions be used, extensive lung resection, and functional status postoperatively. I answered those to the best my ability.  Plan: Left VATS, wedge resection, possible left upper lobectomy on Friday, 02/03/2016.  [name], MD [facility] [phone]

## 2016-02-01 ENCOUNTER — Encounter (HOSPITAL_COMMUNITY): Payer: Self-pay

## 2016-02-01 ENCOUNTER — Encounter (HOSPITAL_COMMUNITY)
Admission: RE | Admit: 2016-02-01 | Discharge: 2016-02-01 | Disposition: A | Discharge status: Home / Self Care | Payer: PPO | Source: Ambulatory Visit | Attending: Thoracic Surgery (Cardiothoracic Vascular Surgery) | Admitting: Thoracic Surgery (Cardiothoracic Vascular Surgery)

## 2016-02-01 ENCOUNTER — Ambulatory Visit (HOSPITAL_COMMUNITY)
Admission: RE | Admit: 2016-02-01 | Discharge: 2016-02-01 | Disposition: A | Discharge status: Home / Self Care | Payer: PPO | Source: Ambulatory Visit | Attending: Thoracic Surgery (Cardiothoracic Vascular Surgery) | Admitting: Thoracic Surgery (Cardiothoracic Vascular Surgery)

## 2016-02-01 DIAGNOSIS — J9811 Atelectasis: Secondary | ICD-10-CM | POA: Diagnosis not present

## 2016-02-01 DIAGNOSIS — Y839 Surgical procedure, unspecified as the cause of abnormal reaction of the patient, or of later complication, without mention of misadventure at the time of the procedure: Secondary | ICD-10-CM | POA: Diagnosis not present

## 2016-02-01 DIAGNOSIS — R0602 Shortness of breath: Secondary | ICD-10-CM | POA: Diagnosis not present

## 2016-02-01 DIAGNOSIS — Z01818 Encounter for other preprocedural examination: Secondary | ICD-10-CM

## 2016-02-01 DIAGNOSIS — J939 Pneumothorax, unspecified: Secondary | ICD-10-CM | POA: Diagnosis not present

## 2016-02-01 DIAGNOSIS — R911 Solitary pulmonary nodule: Secondary | ICD-10-CM | POA: Insufficient documentation

## 2016-02-01 DIAGNOSIS — I2510 Atherosclerotic heart disease of native coronary artery without angina pectoris: Secondary | ICD-10-CM | POA: Diagnosis not present

## 2016-02-01 DIAGNOSIS — Y828 Other medical devices associated with adverse incidents: Secondary | ICD-10-CM | POA: Diagnosis not present

## 2016-02-01 DIAGNOSIS — J60 Coalworker's pneumoconiosis: Secondary | ICD-10-CM | POA: Diagnosis not present

## 2016-02-01 DIAGNOSIS — K219 Gastro-esophageal reflux disease without esophagitis: Secondary | ICD-10-CM | POA: Diagnosis not present

## 2016-02-01 DIAGNOSIS — T889XXA Complication of surgical and medical care, unspecified, initial encounter: Secondary | ICD-10-CM | POA: Diagnosis not present

## 2016-02-01 DIAGNOSIS — I10 Essential (primary) hypertension: Secondary | ICD-10-CM | POA: Diagnosis not present

## 2016-02-01 DIAGNOSIS — T797XXA Traumatic subcutaneous emphysema, initial encounter: Secondary | ICD-10-CM | POA: Diagnosis not present

## 2016-02-01 DIAGNOSIS — K5900 Constipation, unspecified: Secondary | ICD-10-CM | POA: Diagnosis not present

## 2016-02-01 DIAGNOSIS — Z7984 Long term (current) use of oral hypoglycemic drugs: Secondary | ICD-10-CM | POA: Diagnosis not present

## 2016-02-01 DIAGNOSIS — Z7982 Long term (current) use of aspirin: Secondary | ICD-10-CM | POA: Diagnosis not present

## 2016-02-01 DIAGNOSIS — J9382 Other air leak: Secondary | ICD-10-CM | POA: Diagnosis not present

## 2016-02-01 DIAGNOSIS — Z4682 Encounter for fitting and adjustment of non-vascular catheter: Secondary | ICD-10-CM | POA: Diagnosis not present

## 2016-02-01 DIAGNOSIS — E119 Type 2 diabetes mellitus without complications: Secondary | ICD-10-CM

## 2016-02-01 DIAGNOSIS — Z87891 Personal history of nicotine dependence: Secondary | ICD-10-CM | POA: Diagnosis not present

## 2016-02-01 DIAGNOSIS — J449 Chronic obstructive pulmonary disease, unspecified: Secondary | ICD-10-CM | POA: Diagnosis not present

## 2016-02-01 DIAGNOSIS — C3412 Malignant neoplasm of upper lobe, left bronchus or lung: Secondary | ICD-10-CM | POA: Diagnosis not present

## 2016-02-01 DIAGNOSIS — R000 Tachycardia, unspecified: Secondary | ICD-10-CM | POA: Diagnosis not present

## 2016-02-01 HISTORY — DX: Personal history of urinary calculi: Z87.442

## 2016-02-01 HISTORY — DX: Headache: R51

## 2016-02-01 HISTORY — DX: Headache, unspecified: R51.9

## 2016-02-01 LAB — COMPREHENSIVE METABOLIC PANEL
ALBUMIN: 4.2 g/dL (ref 3.5–5.0)
ALK PHOS: 87 U/L (ref 38–126)
ALT: 22 U/L (ref 14–54)
ANION GAP: 9 (ref 5–15)
AST: 27 U/L (ref 15–41)
BILIRUBIN TOTAL: 0.3 mg/dL (ref 0.3–1.2)
BUN: 17 mg/dL (ref 6–20)
CALCIUM: 9.4 mg/dL (ref 8.9–10.3)
CO2: 26 mmol/L (ref 22–32)
Chloride: 102 mmol/L (ref 101–111)
Creatinine, Ser: 0.83 mg/dL (ref 0.44–1.00)
GLUCOSE: 168 mg/dL — AB (ref 65–99)
POTASSIUM: 4.3 mmol/L (ref 3.5–5.1)
Sodium: 137 mmol/L (ref 135–145)
TOTAL PROTEIN: 6.9 g/dL (ref 6.5–8.1)

## 2016-02-01 LAB — BLOOD GAS, ARTERIAL
Acid-base deficit: 0.5 mmol/L (ref 0.0–2.0)
BICARBONATE: 23 mmol/L (ref 20.0–28.0)
Drawn by: 20517
FIO2: 21
O2 Saturation: 97.7 %
PATIENT TEMPERATURE: 98.6
PCO2 ART: 33.1 mmHg (ref 32.0–48.0)
PH ART: 7.456 — AB (ref 7.350–7.450)
PO2 ART: 97.6 mmHg (ref 83.0–108.0)

## 2016-02-01 LAB — TYPE AND SCREEN
ABO/RH(D): A POS
ANTIBODY SCREEN: NEGATIVE

## 2016-02-01 LAB — CBC
HEMATOCRIT: 47.3 % — AB (ref 36.0–46.0)
HEMOGLOBIN: 15.4 g/dL — AB (ref 12.0–15.0)
MCH: 28.7 pg (ref 26.0–34.0)
MCHC: 32.6 g/dL (ref 30.0–36.0)
MCV: 88.2 fL (ref 78.0–100.0)
Platelets: 302 10*3/uL (ref 150–400)
RBC: 5.36 MIL/uL — ABNORMAL HIGH (ref 3.87–5.11)
RDW: 12.9 % (ref 11.5–15.5)
WBC: 8.6 10*3/uL (ref 4.0–10.5)

## 2016-02-01 LAB — SURGICAL PCR SCREEN
MRSA, PCR: NEGATIVE
STAPHYLOCOCCUS AUREUS: NEGATIVE

## 2016-02-01 LAB — URINE MICROSCOPIC-ADD ON
Bacteria, UA: NONE SEEN
RBC / HPF: NONE SEEN RBC/hpf (ref 0–5)

## 2016-02-01 LAB — URINALYSIS, ROUTINE W REFLEX MICROSCOPIC
Bilirubin Urine: NEGATIVE
Glucose, UA: NEGATIVE mg/dL
HGB URINE DIPSTICK: NEGATIVE
Ketones, ur: NEGATIVE mg/dL
Nitrite: NEGATIVE
Protein, ur: NEGATIVE mg/dL
SPECIFIC GRAVITY, URINE: 1.02 (ref 1.005–1.030)
pH: 5.5 (ref 5.0–8.0)

## 2016-02-01 LAB — APTT: aPTT: 31 seconds (ref 24–36)

## 2016-02-01 LAB — GLUCOSE, CAPILLARY: Glucose-Capillary: 209 mg/dL — ABNORMAL HIGH (ref 65–99)

## 2016-02-01 LAB — PROTIME-INR
INR: 1.03
PROTHROMBIN TIME: 13.5 s (ref 11.4–15.2)

## 2016-02-01 NOTE — Progress Notes (Signed)
CYE:LYHTM Kaplin,PA, Dr. Kathryne Eriksson @ [facility] in [place] : also manage her diabetes Fasting sugars 120-140  Cardiologist: Dr. Gwenlyn Found

## 2016-02-01 NOTE — Pre-Procedure Instructions (Signed)
AREEBA SULSER  02/01/2016      [facility] - [place], [place] [address] Phone: [phone] Fax: [place], [place] - [address] [place] [address] Phone: [phone] Fax: [phone]    Your procedure is scheduled on Fri. Oct 20  Report to [facility] at 5:30A.M.  Call this number if you have problems the morning of surgery:  [phone]   Remember:  Do not eat food or drink liquids after midnight on Thurs. Oct. 19   Take these medicines the morning of surgery with A SIP OF WATER : tylenol or hydrocodone if needed,valium if needed, dilitiazem (cartia XT), metoprolol (lopressor), eye drops, zofran if needed,pantoprazole (protonix), tiotropium bromide-olodaterol (stiolto ) inhaler-bring to hospital,tizanidine (zanaflex) if needed.            Stop aspirin, advil, motrin, ibuprofen, aleve, fish oil, BC Powders, Goody's vitamins/herbal medicines.    How to Manage Your Diabetes Before and After Surgery  Why is it important to control my blood sugar before and after surgery? . Improving blood sugar levels before and after surgery helps healing and can limit problems. . A way of improving blood sugar control is eating a healthy diet by: o  Eating less sugar and carbohydrates o  Increasing activity/exercise o  Talking with your doctor about reaching your blood sugar goals . High blood sugars (greater than 180 mg/dL) can raise your risk of infections and slow your recovery, so you will need to focus on controlling your diabetes during the weeks before surgery. . Make sure that the doctor who takes care of your diabetes knows about your planned surgery including the date and location.  How do I manage my blood sugar before surgery? . Check your blood sugar at least 4 times a day, starting 2 days before surgery, to  make sure that the level is not too high or low. o Check your blood sugar the morning of your surgery when you wake up and every 2 hours until you get to the Short Stay unit. . If your blood sugar is less than 70 mg/dL, you will need to treat for low blood sugar: o Do not take insulin. o Treat a low blood sugar (less than 70 mg/dL) with  cup of clear juice (cranberry or apple), 4 glucose tablets, OR glucose gel. o Recheck blood sugar in 15 minutes after treatment (to make sure it is greater than 70 mg/dL). If your blood sugar is not greater than 70 mg/dL on recheck, call [phone] for further instructions. . Report your blood sugar to the short stay nurse when you get to Short Stay.  . If you are admitted to the hospital after surgery: o Your blood sugar will be checked by the staff and you will probably be given insulin after surgery (instead of oral diabetes medicines) to make sure you have good blood sugar levels. o The goal for blood sugar control after surgery is 80-180 mg/dL.              WHAT DO I DO ABOUT MY DIABETES MEDICATION?   [name] Do not take oral diabetes medicines (pills) the morning of surgery.    Do not wear jewelry, make-up or nail polish.  Do not wear lotions, powders, or perfumes, or deoderant.  Do not shave 48 hours prior to surgery.  Men may  shave face and neck.  Do not bring valuables to the hospital.  [facility] is not responsible for any belongings or valuables.  Contacts, dentures or bridgework may not be worn into surgery.  Leave your suitcase in the car.  After surgery it may be brought to your room.  For patients admitted to the hospital, discharge time will be determined by your treatment team.  Patients discharged the day of surgery will not be allowed to drive home.   Special instructions: review preparing for surgery  Please read over the following fact sheets that you were given. Coughing and Deep Breathing and MRSA  Information

## 2016-02-02 LAB — HEMOGLOBIN A1C
HEMOGLOBIN A1C: 7 % — AB (ref 4.8–5.6)
Mean Plasma Glucose: 154 mg/dL

## 2016-02-02 NOTE — Progress Notes (Signed)
Anesthesia Chart Review: Patient is a 75 year old female scheduled for left VATS, wedge resection, possible LU lobectomy on 02/03/16 by Dr. Roxan Hockey. She had an incidental finding of left upper lobe lung nodule on neck CTA that was hypermetabolic on PET scan, including two left cervical level Ib submandibular lymph nodes. Biopsy of submandibular LN was inconclusive, so above procedure recommended.  History includes former smoker (quit 2015), HTN, cholesterolemia, COPD Gold II, peptic ulcer disease, GI bleed, GERD, anxiety, diabetes mellitus type 2, PAD s/p bilateral CIA stents '15, carotid artery stenosis s/p carotid artery endarterectomies (ight 2014, left 2015) and s/p left SCA stent 11/15/15, thyroid nodules (benign FNA '17), DOE, headaches (s/p occipital nerve blocks), hysterectomy, appendectomy, breast reduction.  PCP is Bing Matter, PA-C with Dr. Kathryne Eriksson at [facility]. [facility] Cardiologist is Dr. Quay Burow. Pulmonologist is Dr. Christinia Gully. Neurologist is Dr. Rosalin Hawking. Vascular surgeon is Dr. Theotis Burrow.  Meds include aspirin 81 mg, Goody's extra strength, Valium, Cartia XT, fish oil omega-3, metformin, Lopressor, Zofran, Protonix, pravastatin, Zanaflex, Stiolto Respimat, compounded topical pain cream (baclofen 3%, gabapentin 8%, aripiprazole 0.5%, ketorolac 8%, lidocaine 5%).  BP (!) 121/54   Pulse 65   Temp 36.7 C (Oral)   Resp 16   Ht _0  (1.626 m)   Wt 184 lb (83.5 kg)   SpO2 96%   BMI 31.58 kg/m   02/01/16 EKG: SB at 59 bpm.  01/29/13 Nuclear stress test: Overall Impression:  Normal stress nuclear study. LV Wall Motion:  Non-gated due to ectopy.  12/26/15 Carotid U/S: Impression: 1. Patent bilateral carotid endarterectomy site with Doppler velocity suggestive of 1-39% right proximal ICA stenosis and 60-79% left proximal ICA stenosis. The left vertebral is antegrade in the left subclavian is multiphasic.  11/15/15 Aortic arch angiogram with bilateral  carotid angiograms: Impression: #1  no significant right carotid artery stenosis. #2  approximately 50-60 percent left carotid stenosis stenosis. #3  90% left subclavian stenosis, successfully stented with residual stenosis less than 10%.  02/01/16 CXR: IMPRESSION: Stable left upper lobe nodule  12/15/15 PFTs: FVC 2.49 (pre 88%, post 95%), FEV1 1.77 (pre 84%, post 91%), DLCOunc 16.88 (69%).  11/24/15 Thyroid U/S: IMPRESSION: Multiple bilateral and isthmic nodules are present as described. The dominant nodule is in the isthmus measuring 2.9 cm. Findings meet consensus criteria for biopsy. Ultrasound-guided fine needle aspiration should be considered, as per the consensus statement: Management of Thyroid Nodules Detected at [place]: Society of Radiologists in [place]. Radiology 2005; [id number]. (S/P FNA isthmus 1/62/44: Benign follicular nodule.)  Preoperative labs noted. A1c 7.0.  If no acute changes then I would anticipate that she can proceed as planned.  George [facility] Short Stay Center/Anesthesiology Phone [phone] 02/02/2016 9:57 AM

## 2016-02-03 ENCOUNTER — Encounter (HOSPITAL_COMMUNITY): Payer: Self-pay | Admitting: *Deleted

## 2016-02-03 ENCOUNTER — Inpatient Hospital Stay (HOSPITAL_COMMUNITY)
Admission: RE | Admit: 2016-02-03 | Discharge: 2016-02-16 | DRG: 164 | Disposition: A | Discharge status: Home / Self Care | Payer: PPO | Source: Ambulatory Visit | Attending: Thoracic Surgery (Cardiothoracic Vascular Surgery) | Admitting: Thoracic Surgery (Cardiothoracic Vascular Surgery)

## 2016-02-03 ENCOUNTER — Inpatient Hospital Stay (HOSPITAL_COMMUNITY): Payer: PPO | Admitting: Certified Registered Nurse Anesthetist

## 2016-02-03 ENCOUNTER — Inpatient Hospital Stay (HOSPITAL_COMMUNITY): Payer: PPO | Admitting: Emergency Medicine

## 2016-02-03 ENCOUNTER — Inpatient Hospital Stay (HOSPITAL_COMMUNITY): Payer: PPO

## 2016-02-03 ENCOUNTER — Encounter (HOSPITAL_COMMUNITY)
Admission: RE | Disposition: A | Discharge status: Home / Self Care | Payer: Self-pay | Source: Ambulatory Visit | Attending: Thoracic Surgery (Cardiothoracic Vascular Surgery)

## 2016-02-03 DIAGNOSIS — J60 Coalworker's pneumoconiosis: Secondary | ICD-10-CM | POA: Diagnosis not present

## 2016-02-03 DIAGNOSIS — K219 Gastro-esophageal reflux disease without esophagitis: Secondary | ICD-10-CM | POA: Diagnosis not present

## 2016-02-03 DIAGNOSIS — Z87891 Personal history of nicotine dependence: Secondary | ICD-10-CM | POA: Diagnosis not present

## 2016-02-03 DIAGNOSIS — R000 Tachycardia, unspecified: Secondary | ICD-10-CM | POA: Diagnosis not present

## 2016-02-03 DIAGNOSIS — G450 Vertebro-basilar artery syndrome: Secondary | ICD-10-CM

## 2016-02-03 DIAGNOSIS — Z7984 Long term (current) use of oral hypoglycemic drugs: Secondary | ICD-10-CM

## 2016-02-03 DIAGNOSIS — R911 Solitary pulmonary nodule: Secondary | ICD-10-CM | POA: Diagnosis not present

## 2016-02-03 DIAGNOSIS — Y839 Surgical procedure, unspecified as the cause of abnormal reaction of the patient, or of later complication, without mention of misadventure at the time of the procedure: Secondary | ICD-10-CM | POA: Diagnosis not present

## 2016-02-03 DIAGNOSIS — T797XXA Traumatic subcutaneous emphysema, initial encounter: Secondary | ICD-10-CM | POA: Diagnosis present

## 2016-02-03 DIAGNOSIS — Y828 Other medical devices associated with adverse incidents: Secondary | ICD-10-CM | POA: Diagnosis not present

## 2016-02-03 DIAGNOSIS — Z4682 Encounter for fitting and adjustment of non-vascular catheter: Secondary | ICD-10-CM | POA: Diagnosis not present

## 2016-02-03 DIAGNOSIS — E119 Type 2 diabetes mellitus without complications: Secondary | ICD-10-CM | POA: Diagnosis present

## 2016-02-03 DIAGNOSIS — T889XXA Complication of surgical and medical care, unspecified, initial encounter: Secondary | ICD-10-CM | POA: Diagnosis not present

## 2016-02-03 DIAGNOSIS — C3492 Malignant neoplasm of unspecified part of left bronchus or lung: Secondary | ICD-10-CM | POA: Diagnosis present

## 2016-02-03 DIAGNOSIS — I2510 Atherosclerotic heart disease of native coronary artery without angina pectoris: Secondary | ICD-10-CM | POA: Diagnosis present

## 2016-02-03 DIAGNOSIS — J449 Chronic obstructive pulmonary disease, unspecified: Secondary | ICD-10-CM | POA: Diagnosis not present

## 2016-02-03 DIAGNOSIS — I10 Essential (primary) hypertension: Secondary | ICD-10-CM | POA: Diagnosis present

## 2016-02-03 DIAGNOSIS — C3412 Malignant neoplasm of upper lobe, left bronchus or lung: Principal | ICD-10-CM | POA: Diagnosis present

## 2016-02-03 DIAGNOSIS — Z7982 Long term (current) use of aspirin: Secondary | ICD-10-CM | POA: Diagnosis not present

## 2016-02-03 DIAGNOSIS — J939 Pneumothorax, unspecified: Secondary | ICD-10-CM

## 2016-02-03 DIAGNOSIS — K5900 Constipation, unspecified: Secondary | ICD-10-CM | POA: Diagnosis not present

## 2016-02-03 DIAGNOSIS — J9811 Atelectasis: Secondary | ICD-10-CM | POA: Diagnosis not present

## 2016-02-03 DIAGNOSIS — J9382 Other air leak: Secondary | ICD-10-CM | POA: Diagnosis not present

## 2016-02-03 DIAGNOSIS — Z902 Acquired absence of lung [part of]: Secondary | ICD-10-CM

## 2016-02-03 DIAGNOSIS — Z9689 Presence of other specified functional implants: Secondary | ICD-10-CM

## 2016-02-03 DIAGNOSIS — R0602 Shortness of breath: Secondary | ICD-10-CM | POA: Diagnosis not present

## 2016-02-03 HISTORY — PX: LOBECTOMY: SHX5089

## 2016-02-03 HISTORY — PX: VIDEO ASSISTED THORACOSCOPY (VATS)/WEDGE RESECTION: SHX6174

## 2016-02-03 LAB — GLUCOSE, CAPILLARY
GLUCOSE-CAPILLARY: 151 mg/dL — AB (ref 65–99)
Glucose-Capillary: 132 mg/dL — ABNORMAL HIGH (ref 65–99)
Glucose-Capillary: 134 mg/dL — ABNORMAL HIGH (ref 65–99)
Glucose-Capillary: 147 mg/dL — ABNORMAL HIGH (ref 65–99)
Glucose-Capillary: 148 mg/dL — ABNORMAL HIGH (ref 65–99)

## 2016-02-03 SURGERY — VIDEO ASSISTED THORACOSCOPY (VATS)/WEDGE RESECTION
Anesthesia: General | Site: Chest | Laterality: Left

## 2016-02-03 MED ORDER — BIOTIN 5 MG PO CAPS: 5.0000 mg | ORAL_CAPSULE | Freq: Every day | ORAL | Status: DC

## 2016-02-03 MED ORDER — INSULIN ASPART 100 UNIT/ML ~~LOC~~ SOLN
0.0000 [IU] | Freq: Four times a day (QID) | SUBCUTANEOUS | Status: DC
  Administered 2016-02-03 – 2016-02-04 (×4): 2 [IU] via SUBCUTANEOUS
  Administered 2016-02-05: 4 [IU] via SUBCUTANEOUS
  Administered 2016-02-05: 2 [IU] via SUBCUTANEOUS
  Administered 2016-02-05: 4 [IU] via SUBCUTANEOUS
  Administered 2016-02-05 – 2016-02-11 (×14): 2 [IU] via SUBCUTANEOUS
  Administered 2016-02-11: 4 [IU] via SUBCUTANEOUS
  Administered 2016-02-12 (×3): 2 [IU] via SUBCUTANEOUS

## 2016-02-03 MED ORDER — ONDANSETRON HCL 4 MG/2ML IJ SOLN: 4.0000 mg | Freq: Four times a day (QID) | INTRAMUSCULAR | Status: DC | PRN

## 2016-02-03 MED ORDER — FENTANYL CITRATE (PF) 100 MCG/2ML IJ SOLN
INTRAMUSCULAR | Status: AC
  Filled 2016-02-03: qty 2

## 2016-02-03 MED ORDER — FENTANYL 40 MCG/ML IV SOLN
INTRAVENOUS | Status: DC
  Administered 2016-02-03: 180 ug via INTRAVENOUS
  Administered 2016-02-03: 40 ug via INTRAVENOUS
  Administered 2016-02-03: 60 ug via INTRAVENOUS
  Administered 2016-02-03: 14:00:00 via INTRAVENOUS
  Administered 2016-02-04 (×2): 60 ug via INTRAVENOUS
  Administered 2016-02-04: 150 ug via INTRAVENOUS
  Administered 2016-02-04: 90 ug via INTRAVENOUS
  Administered 2016-02-05: 70 ug via INTRAVENOUS
  Administered 2016-02-05: 30 ug via INTRAVENOUS
  Administered 2016-02-05 (×2): 100 ug via INTRAVENOUS
  Administered 2016-02-05: 80 ug via INTRAVENOUS
  Administered 2016-02-05: 60 ug via INTRAVENOUS
  Administered 2016-02-06: 50 ug via INTRAVENOUS
  Administered 2016-02-06: 70 ug via INTRAVENOUS
  Administered 2016-02-06 – 2016-02-07 (×4): 120 ug via INTRAVENOUS
  Administered 2016-02-07: 70 ug via INTRAVENOUS
  Administered 2016-02-07: 50 ug via INTRAVENOUS
  Administered 2016-02-07: 8 ug via INTRAVENOUS
  Administered 2016-02-07: 3 ug via INTRAVENOUS
  Administered 2016-02-08: 0 ug via INTRAVENOUS
  Administered 2016-02-08: 50 ug via INTRAVENOUS
  Administered 2016-02-08: 120 ug via INTRAVENOUS
  Administered 2016-02-08: 70 ug via INTRAVENOUS
  Administered 2016-02-08: 20 ug via INTRAVENOUS
  Administered 2016-02-09: 50 ug via INTRAVENOUS
  Administered 2016-02-09: 20 ug via INTRAVENOUS
  Administered 2016-02-09 (×2): 40 ug via INTRAVENOUS
  Administered 2016-02-09: 0 ug via INTRAVENOUS
  Administered 2016-02-09: 110 ug via INTRAVENOUS
  Administered 2016-02-10: 20 ug via INTRAVENOUS
  Administered 2016-02-10 (×2): 70 ug via INTRAVENOUS
  Administered 2016-02-10: 20 ug via INTRAVENOUS
  Administered 2016-02-10 (×2): 60 ug via INTRAVENOUS
  Administered 2016-02-10: 40 ug via INTRAVENOUS
  Administered 2016-02-10: 90 ug via INTRAVENOUS
  Administered 2016-02-11: 100 ug via INTRAVENOUS
  Administered 2016-02-11 (×3): 30 ug via INTRAVENOUS
  Administered 2016-02-12: 60 ug via INTRAVENOUS
  Administered 2016-02-12: 40 ug via INTRAVENOUS
  Administered 2016-02-12: 70 ug via INTRAVENOUS
  Administered 2016-02-12: 20 ug via INTRAVENOUS
  Filled 2016-02-03 (×3): qty 25

## 2016-02-03 MED ORDER — BUPIVACAINE HCL (PF) 0.5 % IJ SOLN
INTRAMUSCULAR | Status: DC | PRN
  Administered 2016-02-03: 10 mL

## 2016-02-03 MED ORDER — MIDAZOLAM HCL 2 MG/2ML IJ SOLN
INTRAMUSCULAR | Status: AC
  Filled 2016-02-03: qty 2

## 2016-02-03 MED ORDER — ONDANSETRON HCL 4 MG/2ML IJ SOLN
4.0000 mg | Freq: Four times a day (QID) | INTRAMUSCULAR | Status: DC | PRN
  Administered 2016-02-04 – 2016-02-05 (×2): 4 mg via INTRAVENOUS
  Filled 2016-02-03 (×2): qty 2

## 2016-02-03 MED ORDER — HYDROCODONE-ACETAMINOPHEN 7.5-325 MG PO TABS: 1.0000 | ORAL_TABLET | Freq: Once | ORAL | Status: DC | PRN

## 2016-02-03 MED ORDER — NALOXONE HCL 0.4 MG/ML IJ SOLN
0.4000 mg | INTRAMUSCULAR | Status: DC | PRN
Start: 2016-02-03 — End: 2016-02-12

## 2016-02-03 MED ORDER — LIDOCAINE 2% (20 MG/ML) 5 ML SYRINGE
INTRAMUSCULAR | Status: AC
  Filled 2016-02-03: qty 5

## 2016-02-03 MED ORDER — PANTOPRAZOLE SODIUM 40 MG PO TBEC
40.0000 mg | DELAYED_RELEASE_TABLET | Freq: Two times a day (BID) | ORAL | Status: DC
  Administered 2016-02-03 – 2016-02-16 (×26): 40 mg via ORAL
  Filled 2016-02-03 (×27): qty 1

## 2016-02-03 MED ORDER — FENTANYL CITRATE (PF) 100 MCG/2ML IJ SOLN
INTRAMUSCULAR | Status: AC
  Filled 2016-02-03: qty 4

## 2016-02-03 MED ORDER — PHENYLEPHRINE HCL 10 MG/ML IJ SOLN
INTRAVENOUS | Status: DC | PRN
  Administered 2016-02-03: 25 ug/min via INTRAVENOUS

## 2016-02-03 MED ORDER — SENNOSIDES-DOCUSATE SODIUM 8.6-50 MG PO TABS
1.0000 | ORAL_TABLET | Freq: Every day | ORAL | Status: DC
  Administered 2016-02-03 – 2016-02-15 (×11): 1 via ORAL
  Filled 2016-02-03 (×13): qty 1

## 2016-02-03 MED ORDER — LIDOCAINE 2% (20 MG/ML) 5 ML SYRINGE
INTRAMUSCULAR | Status: DC | PRN
  Administered 2016-02-03: 100 mg via INTRAVENOUS

## 2016-02-03 MED ORDER — BUPIVACAINE HCL (PF) 0.5 % IJ SOLN
INTRAMUSCULAR | Status: AC
  Filled 2016-02-03: qty 10

## 2016-02-03 MED ORDER — HYDROMORPHONE HCL 2 MG/ML IJ SOLN
INTRAMUSCULAR | Status: AC
  Filled 2016-02-03: qty 1

## 2016-02-03 MED ORDER — DIPHENHYDRAMINE HCL 50 MG/ML IJ SOLN: 12.5000 mg | Freq: Four times a day (QID) | INTRAMUSCULAR | Status: DC | PRN

## 2016-02-03 MED ORDER — BUPIVACAINE ON-Q PAIN PUMP (FOR ORDER SET NO CHG)
INJECTION | Status: AC
  Filled 2016-02-03: qty 1

## 2016-02-03 MED ORDER — DIAZEPAM 5 MG PO TABS
5.0000 mg | ORAL_TABLET | Freq: Two times a day (BID) | ORAL | Status: DC
  Administered 2016-02-04 – 2016-02-16 (×25): 5 mg via ORAL
  Filled 2016-02-03 (×25): qty 1

## 2016-02-03 MED ORDER — ALBUMIN HUMAN 5 % IV SOLN
INTRAVENOUS | Status: DC | PRN
  Administered 2016-02-03 (×2): via INTRAVENOUS

## 2016-02-03 MED ORDER — PHENYLEPHRINE 40 MCG/ML (10ML) SYRINGE FOR IV PUSH (FOR BLOOD PRESSURE SUPPORT)
PREFILLED_SYRINGE | INTRAVENOUS | Status: DC | PRN
  Administered 2016-02-03 (×3): 80 ug via INTRAVENOUS

## 2016-02-03 MED ORDER — METOPROLOL TARTRATE 12.5 MG HALF TABLET
12.5000 mg | ORAL_TABLET | Freq: Two times a day (BID) | ORAL | Status: DC
  Administered 2016-02-04 – 2016-02-16 (×25): 12.5 mg via ORAL
  Filled 2016-02-03 (×26): qty 1

## 2016-02-03 MED ORDER — ACETAMINOPHEN 500 MG PO TABS
1000.0000 mg | ORAL_TABLET | Freq: Four times a day (QID) | ORAL | Status: AC
  Administered 2016-02-03 – 2016-02-07 (×11): 1000 mg via ORAL
  Filled 2016-02-03 (×14): qty 2

## 2016-02-03 MED ORDER — POTASSIUM CHLORIDE 10 MEQ/50ML IV SOLN: 10.0000 meq | Freq: Every day | INTRAVENOUS | Status: DC | PRN

## 2016-02-03 MED ORDER — ROCURONIUM BROMIDE 10 MG/ML (PF) SYRINGE
PREFILLED_SYRINGE | INTRAVENOUS | Status: AC
  Filled 2016-02-03: qty 10

## 2016-02-03 MED ORDER — LACTATED RINGERS IV SOLN
INTRAVENOUS | Status: DC | PRN
  Administered 2016-02-03 (×2): via INTRAVENOUS

## 2016-02-03 MED ORDER — SUGAMMADEX SODIUM 200 MG/2ML IV SOLN
INTRAVENOUS | Status: DC | PRN
  Administered 2016-02-03: 200 mg via INTRAVENOUS

## 2016-02-03 MED ORDER — ROCURONIUM BROMIDE 100 MG/10ML IV SOLN
INTRAVENOUS | Status: DC | PRN
  Administered 2016-02-03 (×2): 10 mg via INTRAVENOUS
  Administered 2016-02-03: 5 mg via INTRAVENOUS
  Administered 2016-02-03: 60 mg via INTRAVENOUS
  Administered 2016-02-03: 10 mg via INTRAVENOUS

## 2016-02-03 MED ORDER — ONDANSETRON HCL 4 MG/2ML IJ SOLN
4.0000 mg | Freq: Once | INTRAMUSCULAR | Status: AC | PRN
  Administered 2016-02-03: 4 mg via INTRAVENOUS

## 2016-02-03 MED ORDER — DEXTROSE 5 % IV SOLN
INTRAVENOUS | Status: AC
  Filled 2016-02-03: qty 1.5

## 2016-02-03 MED ORDER — CEFUROXIME SODIUM 1.5 G IJ SOLR
1.5000 g | Freq: Two times a day (BID) | INTRAMUSCULAR | Status: AC
  Administered 2016-02-03 – 2016-02-04 (×2): 1.5 g via INTRAVENOUS
  Filled 2016-02-03 (×2): qty 1.5

## 2016-02-03 MED ORDER — PHENYLEPHRINE 40 MCG/ML (10ML) SYRINGE FOR IV PUSH (FOR BLOOD PRESSURE SUPPORT)
PREFILLED_SYRINGE | INTRAVENOUS | Status: AC
  Filled 2016-02-03: qty 10

## 2016-02-03 MED ORDER — ONDANSETRON HCL 4 MG/2ML IJ SOLN
INTRAMUSCULAR | Status: AC
  Filled 2016-02-03: qty 2

## 2016-02-03 MED ORDER — TRAMADOL HCL 50 MG PO TABS
50.0000 mg | ORAL_TABLET | Freq: Four times a day (QID) | ORAL | Status: DC | PRN
  Administered 2016-02-04 – 2016-02-14 (×9): 50 mg via ORAL
  Filled 2016-02-03 (×10): qty 1

## 2016-02-03 MED ORDER — BUPIVACAINE 0.5 % ON-Q PUMP SINGLE CATH 400 ML
400.0000 mL | INJECTION | Status: AC
  Administered 2016-02-03: 400 mL
  Filled 2016-02-03: qty 400

## 2016-02-03 MED ORDER — HEMOSTATIC AGENTS (NO CHARGE) OPTIME
TOPICAL | Status: DC | PRN
  Administered 2016-02-03: 1 via TOPICAL

## 2016-02-03 MED ORDER — PRAVASTATIN SODIUM 40 MG PO TABS
40.0000 mg | ORAL_TABLET | Freq: Every day | ORAL | Status: DC
  Administered 2016-02-04 – 2016-02-15 (×12): 40 mg via ORAL
  Filled 2016-02-03 (×13): qty 1

## 2016-02-03 MED ORDER — ASPIRIN EC 81 MG PO TBEC
81.0000 mg | DELAYED_RELEASE_TABLET | Freq: Every day | ORAL | Status: DC
  Administered 2016-02-05 – 2016-02-16 (×12): 81 mg via ORAL
  Filled 2016-02-03 (×13): qty 1

## 2016-02-03 MED ORDER — SODIUM CHLORIDE 0.9 % IV SOLN
INTRAVENOUS | Status: DC
Start: 2016-02-03 — End: 2016-02-04
  Administered 2016-02-03: 19:00:00 via INTRAVENOUS

## 2016-02-03 MED ORDER — PROPOFOL 10 MG/ML IV BOLUS
INTRAVENOUS | Status: DC | PRN
  Administered 2016-02-03: 150 mg via INTRAVENOUS

## 2016-02-03 MED ORDER — PROPOFOL 10 MG/ML IV BOLUS
INTRAVENOUS | Status: AC
  Filled 2016-02-03: qty 40

## 2016-02-03 MED ORDER — HYDROMORPHONE HCL 2 MG/ML IJ SOLN
0.2500 mg | INTRAMUSCULAR | Status: DC | PRN
  Administered 2016-02-03 (×4): 0.5 mg via INTRAVENOUS

## 2016-02-03 MED ORDER — FENTANYL 40 MCG/ML IV SOLN
INTRAVENOUS | Status: AC
  Filled 2016-02-03: qty 25

## 2016-02-03 MED ORDER — DEXTROSE 5 % IV SOLN
1.5000 g | INTRAVENOUS | Status: AC
  Administered 2016-02-03: 1.5 g via INTRAVENOUS

## 2016-02-03 MED ORDER — 0.9 % SODIUM CHLORIDE (POUR BTL) OPTIME
TOPICAL | Status: DC | PRN
  Administered 2016-02-03: 2000 mL

## 2016-02-03 MED ORDER — DIPHENHYDRAMINE HCL 12.5 MG/5ML PO ELIX: 12.5000 mg | ORAL_SOLUTION | Freq: Four times a day (QID) | ORAL | Status: DC | PRN

## 2016-02-03 MED ORDER — EPHEDRINE SULFATE-NACL 50-0.9 MG/10ML-% IV SOSY
PREFILLED_SYRINGE | INTRAVENOUS | Status: DC | PRN
  Administered 2016-02-03 (×2): 5 mg via INTRAVENOUS

## 2016-02-03 MED ORDER — ONDANSETRON HCL 4 MG/2ML IJ SOLN
INTRAMUSCULAR | Status: DC | PRN
  Administered 2016-02-03: 4 mg via INTRAVENOUS

## 2016-02-03 MED ORDER — MIDAZOLAM HCL 5 MG/5ML IJ SOLN
INTRAMUSCULAR | Status: DC | PRN
  Administered 2016-02-03: 2 mg via INTRAVENOUS

## 2016-02-03 MED ORDER — EPHEDRINE 5 MG/ML INJ
INTRAVENOUS | Status: AC
  Filled 2016-02-03: qty 10

## 2016-02-03 MED ORDER — LACTATED RINGERS IV SOLN
INTRAVENOUS | Status: DC | PRN
  Administered 2016-02-03: 08:00:00 via INTRAVENOUS

## 2016-02-03 MED ORDER — ACETAMINOPHEN 160 MG/5ML PO SOLN
1000.0000 mg | Freq: Four times a day (QID) | ORAL | Status: AC
  Administered 2016-02-06 – 2016-02-08 (×7): 1000 mg via ORAL
  Filled 2016-02-03 (×7): qty 40.6
  Filled 2016-02-03: qty 40
  Filled 2016-02-03: qty 40.6

## 2016-02-03 MED ORDER — FENTANYL CITRATE (PF) 100 MCG/2ML IJ SOLN
INTRAMUSCULAR | Status: DC | PRN
  Administered 2016-02-03 (×10): 50 ug via INTRAVENOUS

## 2016-02-03 MED ORDER — SODIUM CHLORIDE 0.9% FLUSH: 9.0000 mL | INTRAVENOUS | Status: DC | PRN

## 2016-02-03 MED ORDER — SUGAMMADEX SODIUM 200 MG/2ML IV SOLN
INTRAVENOUS | Status: AC
  Filled 2016-02-03: qty 2

## 2016-02-03 MED ORDER — BISACODYL 5 MG PO TBEC
10.0000 mg | DELAYED_RELEASE_TABLET | Freq: Every day | ORAL | Status: DC
  Administered 2016-02-04 – 2016-02-16 (×10): 10 mg via ORAL
  Filled 2016-02-03 (×11): qty 2

## 2016-02-03 SURGICAL SUPPLY — 104 items
ADH SKN CLS APL DERMABOND .7 (GAUZE/BANDAGES/DRESSINGS)
APL SKNCLS STERI-STRIP NONHPOA (GAUZE/BANDAGES/DRESSINGS) ×1
APPLICATOR COTTON TIP 6IN STRL (MISCELLANEOUS) ×2 IMPLANT
BAG SPEC RTRVL LRG 6X4 10 (ENDOMECHANICALS) ×1
BENZOIN TINCTURE PRP APPL 2/3 (GAUZE/BANDAGES/DRESSINGS) ×2 IMPLANT
CANISTER SUCTION 2500CC (MISCELLANEOUS) ×4 IMPLANT
CATH KIT ON Q 5IN SLV (PAIN MANAGEMENT) ×1 IMPLANT
CATH KIT ON-Q SILVERSOAK 5 (CATHETERS) IMPLANT
CATH KIT ON-Q SILVERSOAK 5IN (CATHETERS) ×2 IMPLANT
CATH THORACIC 28FR (CATHETERS) ×1 IMPLANT
CATH THORACIC 36FR (CATHETERS) IMPLANT
CATH THORACIC 36FR RT ANG (CATHETERS) IMPLANT
CLIP TI MEDIUM 6 (CLIP) ×3 IMPLANT
CONN ST 1/4X3/8  BEN (MISCELLANEOUS) ×1
CONN ST 1/4X3/8 BEN (MISCELLANEOUS) IMPLANT
CONN Y 3/8X3/8X3/8  BEN (MISCELLANEOUS)
CONN Y 3/8X3/8X3/8 BEN (MISCELLANEOUS) IMPLANT
CONT SPEC 4OZ CLIKSEAL STRL BL (MISCELLANEOUS) ×12 IMPLANT
COVER SURGICAL LIGHT HANDLE (MISCELLANEOUS) ×2 IMPLANT
CUTTER ECHEON FLEX ENDO 45 340 (ENDOMECHANICALS) ×1 IMPLANT
DERMABOND ADVANCED (GAUZE/BANDAGES/DRESSINGS)
DERMABOND ADVANCED .7 DNX12 (GAUZE/BANDAGES/DRESSINGS) IMPLANT
DRAIN CHANNEL 28F RND 3/8 FF (WOUND CARE) ×1 IMPLANT
DRAIN CHANNEL 32F RND 10.7 FF (WOUND CARE) IMPLANT
DRAPE LAPAROSCOPIC ABDOMINAL (DRAPES) ×2 IMPLANT
DRAPE WARM FLUID 44X44 (DRAPE) ×2 IMPLANT
ELECT BLADE 4.0 EZ CLEAN MEGAD (MISCELLANEOUS) ×2
ELECT BLADE 6.5 EXT (BLADE) ×3 IMPLANT
ELECT REM PT RETURN 9FT ADLT (ELECTROSURGICAL) ×2
ELECTRODE BLDE 4.0 EZ CLN MEGD (MISCELLANEOUS) IMPLANT
ELECTRODE REM PT RTRN 9FT ADLT (ELECTROSURGICAL) ×1 IMPLANT
GAUZE SPONGE 4X4 12PLY STRL (GAUZE/BANDAGES/DRESSINGS) ×2 IMPLANT
GLOVE BIOGEL PI IND STRL 6.5 (GLOVE) IMPLANT
GLOVE BIOGEL PI INDICATOR 6.5 (GLOVE) ×1
GLOVE ECLIPSE 6.5 STRL STRAW (GLOVE) ×1 IMPLANT
GLOVE SURG SIGNA 7.5 PF LTX (GLOVE) ×4 IMPLANT
GOWN STRL REUS W/ TWL LRG LVL3 (GOWN DISPOSABLE) ×2 IMPLANT
GOWN STRL REUS W/ TWL XL LVL3 (GOWN DISPOSABLE) ×2 IMPLANT
GOWN STRL REUS W/TWL LRG LVL3 (GOWN DISPOSABLE) ×8
GOWN STRL REUS W/TWL XL LVL3 (GOWN DISPOSABLE) ×4
HANDLE STAPLE ENDO GIA SHORT (STAPLE)
HEMOSTAT SURGICEL 2X14 (HEMOSTASIS) ×1 IMPLANT
KIT BASIN OR (CUSTOM PROCEDURE TRAY) ×2 IMPLANT
KIT ROOM TURNOVER OR (KITS) ×2 IMPLANT
KIT SUCTION CATH 14FR (SUCTIONS) ×3 IMPLANT
NDL BIOPSY 14X6 SOFT TISS (NEEDLE) IMPLANT
NEEDLE BIOPSY 14X6 SOFT TISS (NEEDLE) ×4 IMPLANT
NS IRRIG 1000ML POUR BTL (IV SOLUTION) ×6 IMPLANT
PACK CHEST (CUSTOM PROCEDURE TRAY) ×2 IMPLANT
PAD ARMBOARD 7.5X6 YLW CONV (MISCELLANEOUS) ×4 IMPLANT
POUCH ENDO CATCH II 15MM (MISCELLANEOUS) ×1 IMPLANT
POUCH SPECIMEN RETRIEVAL 10MM (ENDOMECHANICALS) ×1 IMPLANT
RELOAD GOLD ECHELON 45 (STAPLE) ×4 IMPLANT
RELOAD GREEN ECHELON 45 (STAPLE) ×3 IMPLANT
RELOAD STAPLE 35X2.5 WHT THIN (STAPLE) IMPLANT
SCISSORS ENDO CVD 5DCS (MISCELLANEOUS) IMPLANT
SEALANT PROGEL (MISCELLANEOUS) IMPLANT
SEALANT SURG COSEAL 4ML (VASCULAR PRODUCTS) IMPLANT
SEALANT SURG COSEAL 8ML (VASCULAR PRODUCTS) IMPLANT
SOLUTION ANTI FOG 6CC (MISCELLANEOUS) ×3 IMPLANT
SPECIMEN JAR MEDIUM (MISCELLANEOUS) ×2 IMPLANT
SPONGE GAUZE 4X4 12PLY STER LF (GAUZE/BANDAGES/DRESSINGS) ×1 IMPLANT
SPONGE INTESTINAL PEANUT (DISPOSABLE) ×6 IMPLANT
SPONGE LAP 18X18 X RAY DECT (DISPOSABLE) ×1 IMPLANT
SPONGE TONSIL 1 RF SGL (DISPOSABLE) ×3 IMPLANT
STAPLE RELOAD 2.5MM WHITE (STAPLE) ×14 IMPLANT
STAPLER ENDO GIA 12 SHRT THIN (STAPLE) IMPLANT
STAPLER ENDO GIA 12MM SHORT (STAPLE) IMPLANT
STAPLER VASCULAR ECHELON 35 (CUTTER) ×1 IMPLANT
SUT PROLENE 4 0 RB 1 (SUTURE) ×2
SUT PROLENE 4-0 RB1 .5 CRCL 36 (SUTURE) IMPLANT
SUT PROLENE 4-0 RB1 18X2 ARM (SUTURE) IMPLANT
SUT SILK  1 MH (SUTURE) ×3
SUT SILK 1 MH (SUTURE) ×1 IMPLANT
SUT SILK 1 TIES 10X30 (SUTURE) ×3 IMPLANT
SUT SILK 2 0 SH (SUTURE) IMPLANT
SUT SILK 2 0SH CR/8 30 (SUTURE) IMPLANT
SUT SILK 3 0 SH 30 (SUTURE) IMPLANT
SUT SILK 3 0SH CR/8 30 (SUTURE) ×1 IMPLANT
SUT VIC AB 0 CTX 27 (SUTURE) IMPLANT
SUT VIC AB 1 CTX 27 (SUTURE) ×3 IMPLANT
SUT VIC AB 2-0 CT1 27 (SUTURE)
SUT VIC AB 2-0 CT1 TAPERPNT 27 (SUTURE) IMPLANT
SUT VIC AB 2-0 CTX 36 (SUTURE) ×2 IMPLANT
SUT VIC AB 3-0 MH 27 (SUTURE) IMPLANT
SUT VIC AB 3-0 SH 27 (SUTURE) ×2
SUT VIC AB 3-0 SH 27X BRD (SUTURE) IMPLANT
SUT VIC AB 3-0 X1 27 (SUTURE) ×4 IMPLANT
SUT VICRYL 0 UR6 27IN ABS (SUTURE) ×3 IMPLANT
SUT VICRYL 2 TP 1 (SUTURE) IMPLANT
SWAB COLLECTION DEVICE MRSA (MISCELLANEOUS) IMPLANT
SYRINGE 10CC LL (SYRINGE) ×2 IMPLANT
SYSTEM SAHARA CHEST DRAIN ATS (WOUND CARE) ×2 IMPLANT
TAPE CLOTH SURG 4X10 WHT LF (GAUZE/BANDAGES/DRESSINGS) ×1 IMPLANT
TIP APPLICATOR SPRAY EXTEND 16 (VASCULAR PRODUCTS) IMPLANT
TOWEL OR 17X24 6PK STRL BLUE (TOWEL DISPOSABLE) ×2 IMPLANT
TOWEL OR 17X26 10 PK STRL BLUE (TOWEL DISPOSABLE) ×4 IMPLANT
TRAP SPECIMEN MUCOUS 40CC (MISCELLANEOUS) IMPLANT
TRAY FOLEY CATH 16FRSI W/METER (SET/KITS/TRAYS/PACK) ×2 IMPLANT
TROCAR XCEL BLADELESS 5X75MML (TROCAR) ×2 IMPLANT
TROCAR XCEL NON-BLD 5MMX100MML (ENDOMECHANICALS) ×1 IMPLANT
TUBE ANAEROBIC SPECIMEN COL (MISCELLANEOUS) IMPLANT
TUNNELER SHEATH ON-Q 11GX8 DSP (PAIN MANAGEMENT) ×1 IMPLANT
WATER STERILE IRR 1000ML POUR (IV SOLUTION) ×2 IMPLANT

## 2016-02-03 NOTE — Anesthesia Procedure Notes (Signed)
Central Venous Catheter Insertion Performed by: anesthesiologist Patient location: Pre-op. Preanesthetic checklist: patient identified, IV checked, site marked, risks and benefits discussed, surgical consent, monitors and equipment checked, pre-op evaluation, timeout performed and anesthesia consent Position: Trendelenburg Lidocaine 1% used for infiltration Landmarks identified and Seldinger technique used Catheter size: 8 Fr Central line was placed.Double lumen Procedure performed using ultrasound guided technique. Attempts: 2 Following insertion, dressing applied, line sutured and Biopatch. Post procedure assessment: blood return through all ports. Patient tolerated the procedure well with no immediate complications. Additional procedure comments: The left carotid artery was initially hit with the finder needle on the left side, small gauge needle immediately removed and pressure held for 15 minutes, procedure then reattempted on right side with immediate success. No signs of swelling on left side of neck, neurologically completely baseline during and after procedure .

## 2016-02-03 NOTE — H&P (View-Only) (Signed)
    [address]       [place] [address]             [phone]       HPI: Caitlin Oliver returns to further discuss management of her left upper lobe lung nodule.  She is a Caitlin former smoker who has a past medical history of arthroscopic cardiovascular disease, hypertension, hypercholesterolemia, tobacco abuse, COPD, reflux, peptic ulcer disease, gastrointestinal bleed, anxiety, and type 2 diabetes. She had an episode in June where she developed severe headaches nausea and dizziness. A CT of the head and neck showed stenosis of the right common carotid, distal right internal carotid, left ICA, and the left subclavian. She was thought to have subclavian steal. A subclavian stent was placed. The procedure was successful, but did not affect her headaches, dizziness, and nausea.  An incidental finding on the CT of the head and neck was a left upper lobe lung nodule. A CT chest was done 11/07/2015 that showed a 1.2 cm nodule. On PET CT this nodule was hypermetabolic with SUV of 14. She saw Dr. Wert and was started on Stiolto and referred for surgery.  I saw on 01/10/2016. I discussed the options of surgical resection versus stereotactic radiation. We discussed the relative advantages and disadvantages of each approach. We discussed the possibility of a bronchoscopic biopsy. My recommendation was to proceed with left VATS, wedge resection, possible left upper lobectomy. My index suspicion with the lesion. Because the possibility of a false negative result with bronchoscopy, I did not recommend that procedure prior to surgery. He was very anxious about possible surgery returns today with multiple questions.  In the interim since her last visit she's been feeling well. Her shortness of breath with exertion is unchanged. She has not had any chest pain, pressure, tightness, or burning. She denies wheezing.  Past Medical History:  Diagnosis Date  . Anxiety   . Carotid artery  disease (HCC)   . Claudication (HCC)   . COPD (chronic obstructive pulmonary disease) (HCC)   . Diabetes mellitus   . Dizzy   . GERD (gastroesophageal reflux disease)   . Hypercholesteremia   . Hypertension   . 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 Shortness of breath    with exertion  . Stomach ulcer   . Tobacco abuse    Past Surgical History:  Procedure Laterality Date  . ABDOMINAL HYSTERECTOMY    . APPENDECTOMY    . BREAST REDUCTION SURGERY    . CAROTID ANGIOGRAM N/A 02/25/2013   Procedure: CAROTID ANGIOGRAM;  Surgeon: Jonathan J Berry, MD;  Location: [facility];  Service: Cardiovascular;  Laterality: N/A;  . ENDARTERECTOMY Right 03/06/2013   Procedure: ENDARTERECTOMY CAROTID-RIGHT;  Surgeon: Vance W Brabham, MD;  Location: [facility] OR;  Service: Vascular;  Laterality: Right;  . ENDARTERECTOMY Left 05/07/2013   Procedure: LEFT CAROTID ARTERY ENDARTERECTOMY WITH VASCU-GUARD PATCH ANGIOPLASTY ;  Surgeon: Vance W Brabham, MD;  Location: [facility] OR;  Service: Vascular;  Laterality: Left;  . LOWER EXTREMITY ANGIOGRAM N/A 02/25/2013   Procedure: LOWER EXTREMITY ANGIOGRAM;  Surgeon: Jonathan J Berry, MD;  Location: [facility];  Service: Cardiovascular;  Laterality: N/A;  . LOWER EXTREMITY ANGIOGRAM N/A 07/27/2013   Procedure: LOWER EXTREMITY ANGIOGRAM;  Surgeon: Jonathan J Berry, MD;  Location: [facility];  Service: Cardiovascular;  Laterality: N/A;  . PATCH ANGIOPLASTY Right 03/06/2013   Procedure: PATCH ANGIOPLASTY of Right Carotid   Artery using Vascu-Guard Patch;  Surgeon: Vance W Brabham, MD;  Location: [facility] OR;  Service: Vascular;  Laterality: Right;  . PERIPHERAL VASCULAR CATHETERIZATION N/A 11/15/2015   Procedure: Aortic Arch Angiography;  Surgeon: Vance W Brabham, MD;  Location: [facility];  Service: Cardiovascular;  Laterality: N/A;  . PERIPHERAL VASCULAR CATHETERIZATION Bilateral 11/15/2015   Procedure: Carotid Angiography;  Surgeon:  Vance W Brabham, MD;  Location: [facility];  Service: Cardiovascular;  Laterality: Bilateral;  . PERIPHERAL VASCULAR CATHETERIZATION Left 11/15/2015   Procedure: Upper Extremity Angiography;  Surgeon: Vance W Brabham, MD;  Location: [facility];  Service: Cardiovascular;  Laterality: Left;  . PERIPHERAL VASCULAR CATHETERIZATION Left 11/15/2015   Procedure: Peripheral Vascular Intervention;  Surgeon: Vance W Brabham, MD;  Location: [facility];  Service: Cardiovascular;  Laterality: Left;  subclavian   . SALIVARY GLAND SURGERY     scar tissue removed from left saliva glad  . TUBAL LIGATION        Current Outpatient Prescriptions  Medication Sig Dispense Refill  . aspirin 81 MG tablet Take 81 mg by mouth daily.    . Biotin (BIOTIN MAXIMUM STRENGTH) 5 MG CAPS Take 1 capsule by mouth daily.    . diazepam (VALIUM) 5 MG tablet Take 5 mg by mouth every 12 (twelve) hours. Anxiety    . diltiazem (CARTIA XT) 300 MG 24 hr capsule Take 300 mg by mouth daily.    . fish oil-omega-3 fatty acids 1000 MG capsule Take 1 g by mouth daily.     . HYDROcodone-acetaminophen (NORCO/VICODIN) 5-325 MG tablet Take 1 tablet by mouth every 6 (six) hours as needed for moderate pain.     . metFORMIN (GLUCOPHAGE) 500 MG tablet Take 500 mg by mouth daily with breakfast.     . metoprolol tartrate (LOPRESSOR) 25 MG tablet Take 1 tablet by mouth daily.     . NONFORMULARY OR COMPOUNDED ITEM [facility]:  [facility] - Baclofen 3%, Gabapentin 8%, Aripiprazole 0.5%, Ketorolac 8%, Licocaine 5%, dispense 240grams, apply 1-2 pumps to focal pain area 3-4 times daily, rub in 1-2 minutes, +3Refills 240 each 3  . ondansetron (ZOFRAN) 4 MG tablet Take 4 mg by mouth every 8 (eight) hours as needed for nausea or vomiting.     . pantoprazole (PROTONIX) 40 MG tablet Take 40 mg by mouth 2 (two) times daily.     . pravastatin (PRAVACHOL) 40 MG tablet Take 40 mg by mouth daily.    . Tiotropium  Bromide-Olodaterol (STIOLTO RESPIMAT) 2.5-2.5 MCG/ACT AERS Inhale 2 puffs into the lungs daily. 1 Inhaler 11  . tiZANidine (ZANAFLEX) 2 MG tablet Take 1 tablet (2 mg total) by mouth 2 (two) times daily. 60 tablet 1   No current facility-administered medications for this visit.     Physical Exam BP 110/64   Pulse 66   Resp 15   Ht (P) 5' 4" (1.626 m)   Wt (P) 183 lb (83 kg)   SpO2 96% Comment: ON RA  BMI (P) 31.41 kg/m  Anxious Caitlin Oliver in no acute distress Alert and oriented 3, slurred speech, no focal motor deficit No cervical or supraclavicular adenopathy Lungs clear with equal size bilaterally Cardiac regular rate and rhythm normal S1 and S2 Abdomen soft nontender Sugars are without clubbing cyanosis or edema  Diagnostic Tests: I again reviewed the CT and PET/CT and her pulmonary function testing findings as noted in my previous note from 01/10/2016. She has 1.2   cm hypermetabolic nodule in the left upper lobe. She has adequate pulmonary function to tolerate a resection.  Impression: Caitlin Oliver with a history of tobacco abuse who has a hypermetabolic 1.2 cm nodule in the left upper lobe. This is highly suspicious for primary bronchogenic carcinoma. Other items in the differential included infectious or inflammatory nodules.  I again had a lengthy discussion with Caitlin Oliver regarding the nodule. They understand this has to be considered a lung cancer unless it can be proven otherwise. We again discussed the relative advantages and disadvantages of surgery versus stereotactic radiation as a primary mode of treatment. We again discussed the issues related to bronchoscopy prior to surgery. Certainly that is an option, but in order to make a definitive diagnosis, I think a wedge resection followed by a lobectomy at the same setting if its cancer is her best option.  I had previously discussed the indications, risks, benefits, and alternatives with her. She understands  the risks as outlined in my previous note from 01/10/2016. She does not have any questions about those risks. She did have some questions about pain management, incisions be used, extensive lung resection, and functional status postoperatively. I answered those to the best my ability.  Plan: Left VATS, wedge resection, possible left upper lobectomy on Friday, 02/03/2016.  Fender Herder C Tyerra Loretto, MD [facility] [phone]    

## 2016-02-03 NOTE — Progress Notes (Signed)
Noted bubbling in patient's [place] indicative of chest tube leak.  Patient is stable with all vital signs WDL.  Dr. Roxan Hockey was notified.

## 2016-02-03 NOTE — Brief Op Note (Addendum)
02/03/2016  11:45 AM  PATIENT:  Caitlin Oliver  75 y.o. female  PRE-OPERATIVE DIAGNOSIS:  LEFT UPPER LOBE NODULE  POST-OPERATIVE DIAGNOSIS:  Non small cancer LUL- Clinical stage IA  PROCEDURE:   LEFT VIDEO ASSISTED THORACOSCOPY,  THORACOSCOPIC ASSISTED LEFT UPPER LOBECTOMY,  MEDIASTINAL LYMPH NODE SAMPLING, ON-Q LOCAL ANESTHETIC CATHETER PLACEMENT  SURGEON:  Surgeon(s) and Role:    * [name], MD - Primary  PHYSICIAN ASSISTANT: Lars Pinks PA-C  ANESTHESIA:   general  EBL:  Total I/O In: 1500 [I.V.:1000; IV Piggyback:500] Out: 455 [Urine:155; Blood:300]  BLOOD ADMINISTERED:none  DRAINS: 18 Blake drain and chest tube placed in the left pleural space   LOCAL MEDICATIONS USED: 5 ml MARCAINE     SPECIMEN:  Source of Specimen:  Left upper lobectomy, multiple lymph nodes  DISPOSITION OF SPECIMEN:  Pathology-Frozen section of LUL nodule was non small cell carcinoma. Margins NEGATIVE for cancer.  COUNTS CORRECT:  YES  PLAN OF CARE: Admit to inpatient   PATIENT DISPOSITION:  PACU - hemodynamically stable.   Delay start of Pharmacological VTE agent (>24hrs) due to surgical blood loss or risk of bleeding: yes

## 2016-02-03 NOTE — Transfer of Care (Signed)
Immediate Anesthesia Transfer of Care Note  Patient: Caitlin Oliver  Procedure(s) Performed: Procedure(s): VIDEO ASSISTED THORACOSCOPY (Left) LEFT UPPER LOBECTOMY (Left)  Patient Location: PACU  Anesthesia Type:General  Level of Consciousness: awake, alert , oriented and patient cooperative  Airway & Oxygen Therapy: Patient Spontanous Breathing and Patient connected to face mask oxygen  Post-op Assessment: Report given to RN and Post -op Vital signs reviewed and stable  Post vital signs: Reviewed and stable  Last Vitals:  Vitals:   02/03/16 0604 02/03/16 1203  BP: (!) 161/69 (P) 108/72  Pulse: 66 (P) 72  Resp: 16 (P) 15  Temp: 36.6 C (P) 36.6 C    Last Pain:  Vitals:   02/03/16 0604  TempSrc: Oral         Complications: No apparent anesthesia complications

## 2016-02-03 NOTE — Progress Notes (Signed)
Received call from radiologist that patient's chest x-ray showed pneumothorax at left lung apex.  Asked to relay this information to physician.  Dr. Roxan Hockey was notified.

## 2016-02-03 NOTE — Interval H&P Note (Signed)
History and Physical Interval Note:  02/03/2016 7:22 AM  Caitlin Oliver  has presented today for surgery, with the diagnosis of LUL NODULE  The various methods of treatment have been discussed with the patient and family. After consideration of risks, benefits and other options for treatment, the patient has consented to  Procedure(s): VIDEO ASSISTED THORACOSCOPY (VATS)/WEDGE RESECTION (Left) POSSIBLE LEFT UPPER LOBECTOMY (Left) as a surgical intervention .  The patient's history has been reviewed, patient examined, no change in status, stable for surgery.  I have reviewed the patient's chart and labs.  Questions were answered to the patient's satisfaction.     [name]

## 2016-02-03 NOTE — Anesthesia Preprocedure Evaluation (Signed)
Anesthesia Evaluation  Patient identified by MRN, date of birth, ID band Patient awake    Reviewed: Allergy & Precautions, H&P , NPO status , Patient's Chart, lab work & pertinent test results  History of Anesthesia Complications Negative for: history of anesthetic complications  Airway Mallampati: II  TM Distance: >3 FB Neck ROM: Full    Dental  (+) Dental Advisory Given, Teeth Intact   Pulmonary shortness of breath and with exertion, COPD, former smoker,    breath sounds clear to auscultation + decreased breath sounds      Cardiovascular hypertension, Pt. on medications + Peripheral Vascular Disease  Normal cardiovascular exam Rhythm:Regular Rate:Normal  10/14 Stress test: normal study, no ischemia   Neuro/Psych Anxiety negative neurological ROS     GI/Hepatic Neg liver ROS, PUD, GERD  Medicated and Controlled,  Endo/Other  diabetes, Well Controlled, Type 2, Oral Hypoglycemic Agents  Renal/GU negative Renal ROS     Musculoskeletal   Abdominal   Peds  Hematology   Anesthesia Other Findings   Reproductive/Obstetrics                             Anesthesia Physical  Anesthesia Plan  ASA: III  Anesthesia Plan: General   Post-op Pain Management:    Induction: Intravenous  Airway Management Planned: Double Lumen EBT  Additional Equipment: Arterial line and CVP  Intra-op Plan:   Post-operative Plan: Possible Post-op intubation/ventilation  Informed Consent: I have reviewed the patients History and Physical, chart, labs and discussed the procedure including the risks, benefits and alternatives for the proposed anesthesia with the patient or authorized representative who has indicated his/her understanding and acceptance.   Dental advisory given  Plan Discussed with: CRNA, Anesthesiologist and Surgeon  Anesthesia Plan Comments:         Anesthesia Quick Evaluation

## 2016-02-03 NOTE — Anesthesia Procedure Notes (Signed)
Procedure Name: Intubation Date/Time: 02/03/2016 7:44 AM Performed by: Everlean Cherry A Pre-anesthesia Checklist: Patient identified, Emergency Drugs available, Suction available and Patient being monitored Patient Re-evaluated:Patient Re-evaluated prior to inductionOxygen Delivery Method: Circle system utilized Preoxygenation: Pre-oxygenation with 100% oxygen Intubation Type: IV induction Ventilation: Mask ventilation without difficulty and Oral airway inserted - appropriate to patient size Laryngoscope Size: Mac and 3 Grade View: Grade II Endobronchial tube: Left, Double lumen EBT, EBT position confirmed by auscultation and EBT position confirmed by fiberoptic bronchoscope and 37 Fr Number of attempts: 1 Airway Equipment and Method: Stylet and Fiberoptic brochoscope Placement Confirmation: ETT inserted through vocal cords under direct vision,  positive ETCO2 and breath sounds checked- equal and bilateral Secured at: 31 cm Tube secured with: Tape Dental Injury: Teeth and Oropharynx as per pre-operative assessment

## 2016-02-03 NOTE — Anesthesia Postprocedure Evaluation (Signed)
Anesthesia Post Note  Patient: Caitlin Oliver  Procedure(s) Performed: Procedure(s) (LRB): VIDEO ASSISTED THORACOSCOPY (Left) LEFT UPPER LOBECTOMY (Left)  Patient location during evaluation: PACU Anesthesia Type: General Level of consciousness: awake and alert Pain management: pain level controlled Vital Signs Assessment: post-procedure vital signs reviewed and stable Respiratory status: spontaneous breathing, nonlabored ventilation, respiratory function stable and patient connected to nasal cannula oxygen Cardiovascular status: blood pressure returned to baseline and stable Postop Assessment: no signs of nausea or vomiting Anesthetic complications: no    Last Vitals:  Vitals:   02/03/16 0604 02/03/16 1203  BP: (!) 161/69 108/72  Pulse: 66 72  Resp: 16 15  Temp: 36.6 C 36.6 C    Last Pain:  Vitals:   02/03/16 0604  TempSrc: Oral                 Zenaida Deed

## 2016-02-04 ENCOUNTER — Inpatient Hospital Stay (HOSPITAL_COMMUNITY): Payer: PPO

## 2016-02-04 LAB — GLUCOSE, CAPILLARY
GLUCOSE-CAPILLARY: 150 mg/dL — AB (ref 65–99)
Glucose-Capillary: 148 mg/dL — ABNORMAL HIGH (ref 65–99)
Glucose-Capillary: 148 mg/dL — ABNORMAL HIGH (ref 65–99)
Glucose-Capillary: 142 mg/dL — ABNORMAL HIGH (ref 65–99)

## 2016-02-04 LAB — BLOOD GAS, ARTERIAL
ACID-BASE EXCESS: 2.8 mmol/L — AB (ref 0.0–2.0)
Bicarbonate: 28.2 mmol/L — ABNORMAL HIGH (ref 20.0–28.0)
O2 Content: 2 L/min
O2 SAT: 93.6 %
Patient temperature: 98.6
pCO2 arterial: 55.5 mmHg — ABNORMAL HIGH (ref 32.0–48.0)
pH, Arterial: 7.327 — ABNORMAL LOW (ref 7.350–7.450)
pO2, Arterial: 72.9 mmHg — ABNORMAL LOW (ref 83.0–108.0)

## 2016-02-04 LAB — CBC
HCT: 40.7 % (ref 36.0–46.0)
Hemoglobin: 12.9 g/dL (ref 12.0–15.0)
MCH: 28.4 pg (ref 26.0–34.0)
MCHC: 31.7 g/dL (ref 30.0–36.0)
MCV: 89.6 fL (ref 78.0–100.0)
PLATELETS: 235 10*3/uL (ref 150–400)
RBC: 4.54 MIL/uL (ref 3.87–5.11)
RDW: 13.3 % (ref 11.5–15.5)
WBC: 9.9 10*3/uL (ref 4.0–10.5)

## 2016-02-04 LAB — BASIC METABOLIC PANEL
Anion gap: 5 (ref 5–15)
BUN: 8 mg/dL (ref 6–20)
CALCIUM: 8.2 mg/dL — AB (ref 8.9–10.3)
CO2: 28 mmol/L (ref 22–32)
CREATININE: 0.56 mg/dL (ref 0.44–1.00)
Chloride: 102 mmol/L (ref 101–111)
GFR calc non Af Amer: >60 mL/min (ref >60)
Glucose, Bld: 133 mg/dL — ABNORMAL HIGH (ref 65–99)
Potassium: 3.8 mmol/L (ref 3.5–5.1)
SODIUM: 135 mmol/L (ref 135–145)

## 2016-02-04 MED ORDER — LEVALBUTEROL HCL 0.63 MG/3ML IN NEBU
0.6300 mg | INHALATION_SOLUTION | Freq: Three times a day (TID) | RESPIRATORY_TRACT | Status: DC
  Administered 2016-02-04 – 2016-02-07 (×8): 0.63 mg via RESPIRATORY_TRACT
  Filled 2016-02-04 (×9): qty 3

## 2016-02-04 MED ORDER — SODIUM CHLORIDE 0.45 % IV SOLN
INTRAVENOUS | Status: DC
  Administered 2016-02-04 – 2016-02-08 (×3): via INTRAVENOUS

## 2016-02-04 MED ORDER — LUNG SURGERY BOOK
Freq: Once | Status: DC
  Filled 2016-02-04: qty 1

## 2016-02-04 NOTE — Op Note (Signed)
Caitlin Oliver, Caitlin Oliver NO.:  [id number]  MEDICAL RECORD NO.:  [id number]  LOCATION:  3S16C                        FACILITY:  [facility]  PHYSICIAN:  Revonda Standard. Roxan Hockey, M.D.DATE OF BIRTH:  [birth year]  DATE OF PROCEDURE:  02/03/2016 DATE OF DISCHARGE:                              OPERATIVE REPORT   PREOPERATIVE DIAGNOSIS:  Left upper lobe nodule.  POSTOPERATIVE DIAGNOSIS:  Non-small cell carcinoma, left upper lobe, clinical stage IA.  PROCEDURE PERFORMED:   Left video-assisted thoracoscopy,  Thoracoscopic left upper lobectomy,  mediastinal lymph node sampling, and  On-Q local anesthetic catheter placement.  SURGEONS:  Revonda Standard. Roxan Hockey, M.D.  ASSISTANT:  Lars Pinks, PA.  ANESTHESIA:  General.  FINDINGS:  Unable to remove lesion with wedge resection.  Tru-Cut biopsies were nondiagnostic.  Frozen section of nodule on lobectomy specimen showed squamous cell carcinoma. Enlarged, but otherwise benign- appearing lymph nodes.  CLINICAL NOTE:  Caitlin Oliver is a 75 year old former smoker, who was being evaluated for headaches, dizziness and nausea.  A CT scan of the head and neck showed a left upper lobe nodule.  A chest CT showed 1.2 cm nodule on PET.  The nodule was hypermetabolic with an SUV of 14.  She saw Dr. Christinia Gully, and was referred for surgery.  We discussed the possibility of surgical resection versus radiation therapy.  We discussed the possibility of doing a preoperative biopsy versus intraoperative frozen section.  After extensive discussion, she wished to proceed with left VATS, wedge resection, and possible left upper lobectomy.  The indications, risks, benefits, and alternatives were discussed in detail with the patient.  She accepted the risks and agreed to proceed.  OPERATIVE NOTE:  Caitlin Oliver was brought to the preoperative holding area on February 03, 2016.  Anesthesia placed a central line and an arterial blood pressure  monitoring line.  She was taken to the operating room, anesthetized, and intubated with a double-lumen endotracheal tube. Intravenous antibiotics were administered.  A Foley catheter was placed. Sequential compression devices were placed on the calves for DVT prophylaxis.  She was placed in a right lateral decubitus position and the left chest was prepped and draped in the usual sterile fashion.  Single lung ventilation of the right lung was initiated and was tolerated well throughout the procedure.  An incision was made in the seventh intercostal space in the midaxillary line.  The chest was entered with a 5 mm port.  The thoracoscope was advanced into the chest.  There were no pleural effusion and no abnormality of the visceral or parietal pleura.  The fissure was relatively complete.  The lung was relatively slow to deflate, but over time it did, as there was good isolation of the lung.  A working incision, 5 cm in length, was made anterolaterally in the fourth interspace.  No rib spreading was performed during the procedure.  The upper lobe was grasped.  There were some adhesions in the fissure which were taken down with electrocautery.  The nodule was palpable, and was relatively deep in the upper lobe.  An attempt was made to perform a wedge resection of the nodule with sequential firings of an Echelon powered stapler  using 45 mm cartridges with a combination of green and gold cartridges.  After removal of the specimen, there was no definite tumor present.  A nodule could be palpated just below the staple line. Tru-Cut needle biopsies were performed in this area.  There was bleeding with the Tru-Cut needle biopsies, which was controlled with 3-0 Vicryl sutures.  Tru-Cut needle biopsies were sent for frozen section with the plan that if they show granulomatous disease, resection would not be present, but if nondiagnostic or they showed cancer cells then we would proceed with a left  upper lobectomy.  The frozen section returned showing only benign tissue, but no alternate pathologic diagnosis. Decision was made to proceed with a lobectomy due to the high index suspicion.  An On-Q local anesthetic catheter was placed through a separate stab incision posteriorly and tunneled into a subpleural location.  It was primed with 5 mL of 0.5% Marcaine.  It was secured to the skin with a 3-0 silk suture.  A second port incision was made anterior to the first to assist with retraction.  The inferior pulmonary ligament was divided.  There was a node that was removed.  All lymph nodes that were encountered during the dissection were removed and sent for permanent pathology.  The pleural reflection then was divided at the hilum anteriorly.  The anterior portion of the fissure was nearly entirely complete.  The overlying pleura was divided with electrocautery. The lingular vein branches were encircled and divided with an endoscopic vascular stapler.  There was a large lingular branch of the pulmonary artery visible.  This was dissected out, encircled, and divided with the endoscopic vascular stapler as well.  There was a large level-11 node, actually 2 level-11 nodes present that were removed.  All of the lymph nodes were relatively vascular.  Some were enlarged, but none were grossly cancerous.  The remainder of the fissure then was completed and 2 additional upper lobe branches were identified.  These were both divided with the vascular stapler as well.  Dissection then was carried anteriorly and the remaining upper lobe branches of the superior pulmonary vein were dissected out, encircled, and divided with the endoscopic vascular stapler.  There was a large anterior-apical trunk of the PA to the upper lobe.  This was dissected out.  This took a considerable period of time.  Great care was taken with the dissection. Once it was isolated, it was divided with an endoscopic  vascular stapler.  There was another lingular pulmonary artery branch that came off the main pulmonary artery anteriorly right next to the left upper lobe bronchus.  There was a large node in this area that was removed. The lingular branch was divided with a stapler.  At this point, all that remain was the left upper lobe bronchus.  To get a good angle on the bronchus, a separate posterior stab incision was made.  The stapler was advanced from posterior to anterior, placed across the left upper lobe bronchus and closed, but not fired.  A test inflation initially showed no inflation of the lower lobe.  Dr. Jillyn Hidden from Anesthesia inspected the double-lumen tube, suctioned and eventually with ventilation of the left lung, there was inflation of the lower lobe, but not the upper lobe.  The stapler then was fired transecting the bronchus.  The left upper lobe was placed into an endoscopic retrieval bag, removed, and sent for frozen section of the nodule and the bronchial margin.  The nodule returned showing squamous  cell carcinoma.  The bronchial margin was free of tumor.  The aortopulmonary window was inspected.  There was a large node in this area.  This was removed and a 2nd smaller node was removed as well. These were sent for permanent pathology.  The chest was copiously irrigated with warm saline.  A test inflation to 30 cm of water revealed no significant air leak.  A 28-French Blake drain was placed through the original port incision and directed posteriorly and a 28-French chest tube was placed through the 2nd, more anterior, port incision and directed anteriorly.  They were secured to skin with #1 silk sutures.  Final inspection was made for hemostasis.  The lower lobe was reinflated.  The working incision was closed in 3 layers. The posterior port incision was closed in 2 layers. Both were closed with subcuticular skin closures.  The patient was placed back in a supine position.   The chest tubes were placed to suction.  She was extubated in the operating room and taken to postanesthetic care unit in good condition.     Revonda Standard Roxan Hockey, M.D.     SCH/MEDQ  D:  02/03/2016  T:  02/04/2016  Job:  [id number]

## 2016-02-04 NOTE — Progress Notes (Addendum)
[address]       [place] [address]             [phone]      1 Day Post-Op Procedure(s) (LRB): VIDEO ASSISTED THORACOSCOPY (Left) LEFT UPPER LOBECTOMY (Left)   Subjective:  Caitlin Oliver complains of pain this morning.  Her PCA is not providing enough relief.  She has not gotten out of bed yet.  Objective: Vital signs in last 24 hours: Temp:  [97.7 F (36.5 C)-98.9 F (37.2 C)] 97.7 F (36.5 C) (10/21 0838) Pulse Rate:  [68-99] 99 (10/21 0838) Cardiac Rhythm: Normal sinus rhythm (10/21 0801) Resp:  [10-25] 20 (10/21 0838) BP: (100-155)/(59-86) 138/71 (10/21 0838) SpO2:  [94 %-100 %] 99 % (10/21 3953) Arterial Line BP: (100-178)/(46-86) 153/68 (10/21 0735)  Intake/Output from previous day: 10/20 0701 - 10/21 0700 In: 3790 [P.O.:780; I.V.:1910; IV Piggyback:600] Out: 2805 [Urine:1405; Blood:300; Chest Tube:1100] Intake/Output this shift: Total I/O In: 120 [P.O.:120] Out: -   General appearance: alert, cooperative and no distress Heart: regular rate and rhythm Lungs: wheezes bilaterally Abdomen: soft, non-tender; bowel sounds normal; no masses,  no organomegaly Extremities: extremities normal, atraumatic, no cyanosis or edema Wound: clean and dry  Lab Results:  Recent Labs  02/01/16 1258 02/04/16 0530  WBC 8.6 9.9  HGB 15.4* 12.9  HCT 47.3* 40.7  PLT 302 235   BMET:  Recent Labs  02/01/16 1258 02/04/16 0530  NA 137 135  K 4.3 3.8  CL 102 102  CO2 26 28  GLUCOSE 168* 133*  BUN 17 8  CREATININE 0.83 0.56  CALCIUM 9.4 8.2*    PT/INR:  Recent Labs  02/01/16 1258  LABPROT 13.5  INR 1.03   ABG    Component Value Date/Time   PHART 7.327 (L) 02/04/2016 0600   HCO3 28.2 (H) 02/04/2016 0600   TCO2 27 11/15/2015 0811   ACIDBASEDEF 0.5 02/01/2016 1400   O2SAT 93.6 02/04/2016 0600   CBG (last 3)   Recent Labs  02/03/16 1823 02/03/16 2335 02/04/16 0657  GLUCAP 147* 134* 150*    Assessment/Plan: S/P Procedure(s)  (LRB): VIDEO ASSISTED THORACOSCOPY (Left) LEFT UPPER LOBECTOMY (Left)  1. Chest tube- + air leak, CXR with apical pneumothorax, 1100 cc output since surgery... Leave on suction today 2. Pulm- wean oxygen as tolerated, will start xopenex nebs 3. D/C Arterial line 4. Pain control- using PCA, encouraged use of tablets in addition for adequate pain relief 5. Decrease IV Fluids to 1/2 NS 6. Dispo- patient stable, + air leak on chest tube leave on suction, continue current care    LOS: 1 day    BARRETT, ERIN 02/04/2016  I have seen and examined the patient and agree with the assessment and plan as outlined.  Rexene Alberts, MD 02/04/2016 1:51 PM

## 2016-02-05 ENCOUNTER — Inpatient Hospital Stay (HOSPITAL_COMMUNITY): Payer: PPO

## 2016-02-05 LAB — COMPREHENSIVE METABOLIC PANEL
ALT: 15 U/L (ref 14–54)
AST: 18 U/L (ref 15–41)
Albumin: 3.1 g/dL — ABNORMAL LOW (ref 3.5–5.0)
Alkaline Phosphatase: 65 U/L (ref 38–126)
Anion gap: 5 (ref 5–15)
BUN: 6 mg/dL (ref 6–20)
CHLORIDE: 99 mmol/L — AB (ref 101–111)
CO2: 30 mmol/L (ref 22–32)
Calcium: 8.5 mg/dL — ABNORMAL LOW (ref 8.9–10.3)
Creatinine, Ser: 0.53 mg/dL (ref 0.44–1.00)
Glucose, Bld: 145 mg/dL — ABNORMAL HIGH (ref 65–99)
POTASSIUM: 4.1 mmol/L (ref 3.5–5.1)
SODIUM: 134 mmol/L — AB (ref 135–145)
Total Bilirubin: 0.5 mg/dL (ref 0.3–1.2)
Total Protein: 5.6 g/dL — ABNORMAL LOW (ref 6.5–8.1)

## 2016-02-05 LAB — CBC
HCT: 40.8 % (ref 36.0–46.0)
Hemoglobin: 12.7 g/dL (ref 12.0–15.0)
MCH: 28.2 pg (ref 26.0–34.0)
MCHC: 31.1 g/dL (ref 30.0–36.0)
MCV: 90.7 fL (ref 78.0–100.0)
Platelets: 229 10*3/uL (ref 150–400)
RBC: 4.5 MIL/uL (ref 3.87–5.11)
RDW: 13.1 % (ref 11.5–15.5)
WBC: 11.6 10*3/uL — AB (ref 4.0–10.5)

## 2016-02-05 LAB — GLUCOSE, CAPILLARY
GLUCOSE-CAPILLARY: 142 mg/dL — AB (ref 65–99)
Glucose-Capillary: 182 mg/dL — ABNORMAL HIGH (ref 65–99)
Glucose-Capillary: 164 mg/dL — ABNORMAL HIGH (ref 65–99)
Glucose-Capillary: 150 mg/dL — ABNORMAL HIGH (ref 65–99)

## 2016-02-05 MED ORDER — ONDANSETRON HCL 4 MG/2ML IJ SOLN
4.0000 mg | Freq: Four times a day (QID) | INTRAMUSCULAR | Status: DC | PRN
  Administered 2016-02-06 – 2016-02-15 (×7): 4 mg via INTRAVENOUS
  Filled 2016-02-05 (×7): qty 2

## 2016-02-05 MED ORDER — METOCLOPRAMIDE HCL 5 MG/ML IJ SOLN
10.0000 mg | Freq: Four times a day (QID) | INTRAMUSCULAR | Status: AC
  Administered 2016-02-05 – 2016-02-06 (×4): 10 mg via INTRAVENOUS
  Filled 2016-02-05 (×4): qty 2

## 2016-02-05 MED ORDER — MAGNESIUM CITRATE PO SOLN: 300.0000 mL | Freq: Every day | ORAL | Status: DC | PRN

## 2016-02-05 MED ORDER — LACTULOSE 10 GM/15ML PO SOLN
20.0000 g | Freq: Every day | ORAL | Status: DC | PRN
  Administered 2016-02-05: 20 g via ORAL
  Filled 2016-02-05 (×3): qty 30

## 2016-02-05 NOTE — Progress Notes (Addendum)
[address]       [place] [address]             [phone]      2 Days Post-Op Procedure(s) (LRB): VIDEO ASSISTED THORACOSCOPY (Left) LEFT UPPER LOBECTOMY (Left)   Subjective:  Complains of shoulder pain.  She also request Mag Citrate for BM, which she takes at home.  + ambulation  Objective: Vital signs in last 24 hours: Temp:  [97.7 F (36.5 C)-98.7 F (37.1 C)] 97.7 F (36.5 C) (10/22 1119) Pulse Rate:  [89-104] 91 (10/22 1119) Cardiac Rhythm: Normal sinus rhythm (10/22 0820) Resp:  [14-29] 17 (10/22 1119) BP: (110-174)/(64-90) 114/64 (10/22 1119) SpO2:  [92 %-100 %] 99 % (10/22 1119)  Intake/Output from previous day: 10/21 0701 - 10/22 0700 In: 1648.3 [P.O.:840; I.V.:808.3] Out: 2591 [Urine:3175; Chest Tube:1010] Intake/Output this shift: Total I/O In: 200 [P.O.:200] Out: 1 [Urine:1]  General appearance: alert, cooperative and no distress Heart: regular rate and rhythm Lungs: clear to auscultation bilaterally Abdomen: soft, non-tender; bowel sounds normal; no masses,  no organomegaly Wound: clean and dry  Lab Results:  Recent Labs  02/04/16 0530 02/05/16 0410  WBC 9.9 11.6*  HGB 12.9 12.7  HCT 40.7 40.8  PLT 235 229   BMET:  Recent Labs  02/04/16 0530 02/05/16 0410  NA 135 134*  K 3.8 4.1  CL 102 99*  CO2 28 30  GLUCOSE 133* 145*  BUN 8 6  CREATININE 0.56 0.53  CALCIUM 8.2* 8.5*    PT/INR: No results for input(s): LABPROT, INR in the last 72 hours. ABG    Component Value Date/Time   PHART 7.327 (L) 02/04/2016 0600   HCO3 28.2 (H) 02/04/2016 0600   TCO2 27 11/15/2015 0811   ACIDBASEDEF 0.5 02/01/2016 1400   O2SAT 93.6 02/04/2016 0600   CBG (last 3)   Recent Labs  02/04/16 2305 02/05/16 0531 02/05/16 1116  GLUCAP 142* 142* 182*    Assessment/Plan: S/P Procedure(s) (LRB): VIDEO ASSISTED THORACOSCOPY (Left) LEFT UPPER LOBECTOMY (Left)  1. Chest tube- + air leak, CXR with improvement of pneumothorax-  will leave on suction... Shoulder pain is likely attributed to lung re-expanding 2. CV-hemodynamically stable, continue Lopressor 3. GI- patient request Citroma, will order Lactulose to attempt first, and then if no results she can have citroma 4. D/C Foley 5. Dm- sugars ok for now, continue SSIP will restart Metformin when oral intake improves 6. Dispo- patient stable, chest tubes to suction, repeat CXr in Am   LOS: 2 days    BARRETT, ERIN 02/05/2016  I have seen and examined the patient and agree with the assessment and plan as outlined.  Will try placing tubes to water seal  Rexene Alberts, MD 02/05/2016 12:30 PM

## 2016-02-05 NOTE — Progress Notes (Signed)
Pt observed to have an increased air leak in chest tube. Audible with tidaling. Redressed and reinforced dressing. Minimal improvement. PA notified and gave orders to resume chest tube to suction if pt becomes symptomatic. No other orders given at this time. Will continue to monitor.

## 2016-02-06 ENCOUNTER — Encounter (HOSPITAL_COMMUNITY): Payer: Self-pay | Admitting: Thoracic Surgery (Cardiothoracic Vascular Surgery)

## 2016-02-06 ENCOUNTER — Inpatient Hospital Stay (HOSPITAL_COMMUNITY): Payer: PPO

## 2016-02-06 LAB — GLUCOSE, CAPILLARY
GLUCOSE-CAPILLARY: 102 mg/dL — AB (ref 65–99)
GLUCOSE-CAPILLARY: 150 mg/dL — AB (ref 65–99)
Glucose-Capillary: 110 mg/dL — ABNORMAL HIGH (ref 65–99)
Glucose-Capillary: 142 mg/dL — ABNORMAL HIGH (ref 65–99)

## 2016-02-06 MED ORDER — SODIUM CHLORIDE 0.9% FLUSH
10.0000 mL | Freq: Two times a day (BID) | INTRAVENOUS | Status: DC
  Administered 2016-02-06: 30 mL
  Administered 2016-02-06 – 2016-02-16 (×11): 10 mL

## 2016-02-06 MED ORDER — SODIUM CHLORIDE 0.9% FLUSH
10.0000 mL | INTRAVENOUS | Status: DC | PRN
Start: 2016-02-06 — End: 2016-02-16

## 2016-02-06 MED ORDER — ENOXAPARIN SODIUM 40 MG/0.4ML ~~LOC~~ SOLN
40.0000 mg | SUBCUTANEOUS | Status: DC
  Administered 2016-02-06 – 2016-02-16 (×11): 40 mg via SUBCUTANEOUS
  Filled 2016-02-06 (×11): qty 0.4

## 2016-02-06 NOTE — Progress Notes (Signed)
3 Days Post-Op Procedure(s) (LRB): VIDEO ASSISTED THORACOSCOPY (Left) LEFT UPPER LOBECTOMY (Left) Subjective: Some incisional pain, no nausea or vomiting but "queasy stomach"  Objective: Vital signs in last 24 hours: Temp:  [97.6 F (36.4 C)-99 F (37.2 C)] 97.8 F (36.6 C) (10/23 0733) Pulse Rate:  [87-103] 94 (10/23 0733) Cardiac Rhythm: Normal sinus rhythm (10/22 2000) Resp:  [14-22] 14 (10/23 0733) BP: (110-157)/(64-78) 157/74 (10/23 0733) SpO2:  [93 %-100 %] 99 % (10/23 0733)  Hemodynamic parameters for last 24 hours:    Intake/Output from previous day: 10/22 0701 - 10/23 0700 In: 911.3 [P.O.:440; I.V.:471.3] Out: 422 [Urine:2; Chest Tube:420] Intake/Output this shift: No intake/output data recorded.  General appearance: alert, cooperative and no distress Neurologic: intact Heart: regular rate and rhythm Lungs: diminished breath sounds on left with faint wheezing bilaterally Abdomen: normal findings: soft, non-tender some subq emphysema, + air leak with cough  Lab Results:  Recent Labs  02/04/16 0530 02/05/16 0410  WBC 9.9 11.6*  HGB 12.9 12.7  HCT 40.7 40.8  PLT 235 229   BMET:  Recent Labs  02/04/16 0530 02/05/16 0410  NA 135 134*  K 3.8 4.1  CL 102 99*  CO2 28 30  GLUCOSE 133* 145*  BUN 8 6  CREATININE 0.56 0.53  CALCIUM 8.2* 8.5*    PT/INR: No results for input(s): LABPROT, INR in the last 72 hours. ABG    Component Value Date/Time   PHART 7.327 (L) 02/04/2016 0600   HCO3 28.2 (H) 02/04/2016 0600   TCO2 27 11/15/2015 0811   ACIDBASEDEF 0.5 02/01/2016 1400   O2SAT 93.6 02/04/2016 0600   CBG (last 3)   Recent Labs  02/05/16 1715 02/05/16 2312 02/06/16 0457  GLUCAP 164* 150* 102*    Assessment/Plan: S/P Procedure(s) (LRB): VIDEO ASSISTED THORACOSCOPY (Left) LEFT UPPER LOBECTOMY (Left) -  CV- stable  RESP- s/p left upper lobectomy for non-small cell carcinoma  Decreased drainage overnight- dc posterior CT  Leave anterior  tube to water seal for now  Nebs, IS, cough, deep breathe  RENAL- no issues  ENDO- CBG 102-182 over past 24 hours  DVT prophylaxis- SCD + ambulation, will add enoxparin   LOS: 3 days    Caitlin Oliver 02/06/2016

## 2016-02-07 ENCOUNTER — Inpatient Hospital Stay (HOSPITAL_COMMUNITY): Payer: PPO

## 2016-02-07 LAB — CBC
HCT: 37.7 % (ref 36.0–46.0)
HEMOGLOBIN: 11.9 g/dL — AB (ref 12.0–15.0)
MCH: 28.6 pg (ref 26.0–34.0)
MCHC: 31.6 g/dL (ref 30.0–36.0)
MCV: 90.6 fL (ref 78.0–100.0)
Platelets: 230 10*3/uL (ref 150–400)
RBC: 4.16 MIL/uL (ref 3.87–5.11)
RDW: 13.2 % (ref 11.5–15.5)
WBC: 7.8 10*3/uL (ref 4.0–10.5)

## 2016-02-07 LAB — GLUCOSE, CAPILLARY
GLUCOSE-CAPILLARY: 132 mg/dL — AB (ref 65–99)
Glucose-Capillary: 153 mg/dL — ABNORMAL HIGH (ref 65–99)
Glucose-Capillary: 143 mg/dL — ABNORMAL HIGH (ref 65–99)
Glucose-Capillary: 128 mg/dL — ABNORMAL HIGH (ref 65–99)

## 2016-02-07 LAB — BASIC METABOLIC PANEL
ANION GAP: 5 (ref 5–15)
BUN: 10 mg/dL (ref 6–20)
CHLORIDE: 99 mmol/L — AB (ref 101–111)
CO2: 32 mmol/L (ref 22–32)
CREATININE: 0.6 mg/dL (ref 0.44–1.00)
Calcium: 8.5 mg/dL — ABNORMAL LOW (ref 8.9–10.3)
GFR calc non Af Amer: >60 mL/min (ref >60)
Glucose, Bld: 170 mg/dL — ABNORMAL HIGH (ref 65–99)
POTASSIUM: 4 mmol/L (ref 3.5–5.1)
SODIUM: 136 mmol/L (ref 135–145)

## 2016-02-07 MED ORDER — LEVALBUTEROL HCL 0.63 MG/3ML IN NEBU
0.6300 mg | INHALATION_SOLUTION | Freq: Two times a day (BID) | RESPIRATORY_TRACT | Status: DC
  Administered 2016-02-07 – 2016-02-13 (×12): 0.63 mg via RESPIRATORY_TRACT
  Filled 2016-02-07 (×13): qty 3

## 2016-02-07 NOTE — Care Management Note (Signed)
Case Management Note  Patient Details  Name: Caitlin Oliver MRN: [id number] Date of Birth: 04/28/1940  Subjective/Objective:   S/p Vats, lobectomy, has one chest tube to water seal,  -Still has an air leak but decreased, on fentanyl pca.  NCM will cont to follow for dc needs.                Action/Plan:   Expected Discharge Date:                  Expected Discharge Plan:  Home/Self Care  In-House Referral:     Discharge planning Services  CM Consult  Post Acute Care Choice:    Choice offered to:     DME Arranged:    DME Agency:     HH Arranged:    HH Agency:     Status of Service:  In process, will continue to follow  If discussed at Long Length of Stay Meetings, dates discussed:    Additional Comments:  Zenon Mayo, RN 02/07/2016, 4:47 PM

## 2016-02-07 NOTE — Progress Notes (Signed)
Pt walked the entire unit on 2L O2  [place] and chest tube in place with no complications.

## 2016-02-07 NOTE — Progress Notes (Signed)
Patient placed on RA due to spo2 being 99%.

## 2016-02-07 NOTE — Progress Notes (Signed)
4 Days Post-Op Procedure(s) (LRB): VIDEO ASSISTED THORACOSCOPY (Left) LEFT UPPER LOBECTOMY (Left) Subjective: Doesn't feel as well today. C/o back pain  Objective: Vital signs in last 24 hours: Temp:  [97.7 F (36.5 C)-98.3 F (36.8 C)] 97.7 F (36.5 C) (10/24 0700) Pulse Rate:  [80-94] 94 (10/24 0700) Cardiac Rhythm: Normal sinus rhythm (10/24 0722) Resp:  [15-22] 15 (10/24 0700) BP: (110-157)/(61-85) 157/81 (10/24 0700) SpO2:  [97 %-100 %] 100 % (10/24 0700)  Hemodynamic parameters for last 24 hours:    Intake/Output from previous day: 10/23 0701 - 10/24 0700 In: 2881 [P.O.:2881] Out: 1120 [Urine:970; Chest Tube:50] Intake/Output this shift: No intake/output data recorded.  General appearance: alert, cooperative and no distress Heart: regular rate and rhythm Lungs: wheezes faint on left Wound: clean and dry + air leak  Lab Results:  Recent Labs  02/05/16 0410 02/07/16 0431  WBC 11.6* 7.8  HGB 12.7 11.9*  HCT 40.8 37.7  PLT 229 230   BMET:  Recent Labs  02/05/16 0410 02/07/16 0431  NA 134* 136  K 4.1 4.0  CL 99* 99*  CO2 30 32  GLUCOSE 145* 170*  BUN 6 10  CREATININE 0.53 0.60  CALCIUM 8.5* 8.5*    PT/INR: No results for input(s): LABPROT, INR in the last 72 hours. ABG    Component Value Date/Time   PHART 7.327 (L) 02/04/2016 0600   HCO3 28.2 (H) 02/04/2016 0600   TCO2 27 11/15/2015 0811   ACIDBASEDEF 0.5 02/01/2016 1400   O2SAT 93.6 02/04/2016 0600   CBG (last 3)   Recent Labs  02/06/16 1721 02/06/16 2356 02/07/16 0457  GLUCAP 110* 142* 153*    Assessment/Plan: S/P Procedure(s) (LRB): VIDEO ASSISTED THORACOSCOPY (Left) LEFT UPPER LOBECTOMY (Left) -Still has an air leak but decreased. CXR stable pneumo/ space- keep CT to water seal - creatinine and lytes OK - increase ambulation - SCD + enoxaparin for DVT prophylaxis  PATH- T1,N0 stage IA- patient informed   LOS: 4 days    [name] 02/07/2016

## 2016-02-07 NOTE — Progress Notes (Signed)
Dressing change to chest tube site done. Old dressing saturated. Pt tolerated well. Wound site WNL.

## 2016-02-07 NOTE — Care Management Important Message (Signed)
Important Message  Patient Details  Name: Caitlin Oliver MRN: [id number] Date of Birth: 1940-07-16   Medicare Important Message Given:  Yes    Lynsie Mcwatters 02/07/2016, 1:16 PM

## 2016-02-08 ENCOUNTER — Inpatient Hospital Stay (HOSPITAL_COMMUNITY): Payer: PPO

## 2016-02-08 LAB — GLUCOSE, CAPILLARY
GLUCOSE-CAPILLARY: 123 mg/dL — AB (ref 65–99)
Glucose-Capillary: 127 mg/dL — ABNORMAL HIGH (ref 65–99)
Glucose-Capillary: 139 mg/dL — ABNORMAL HIGH (ref 65–99)

## 2016-02-08 MED ORDER — ACETAMINOPHEN 160 MG/5ML PO SOLN
1000.0000 mg | Freq: Four times a day (QID) | ORAL | Status: AC
  Administered 2016-02-08 – 2016-02-13 (×15): 1000 mg via ORAL
  Filled 2016-02-08 (×14): qty 40.6

## 2016-02-08 MED ORDER — ACETAMINOPHEN 500 MG PO TABS
1000.0000 mg | ORAL_TABLET | Freq: Four times a day (QID) | ORAL | Status: AC
  Administered 2016-02-09 – 2016-02-13 (×4): 1000 mg via ORAL
  Filled 2016-02-08 (×4): qty 2

## 2016-02-08 NOTE — Progress Notes (Signed)
Patient ID: Caitlin Oliver, female   DOB: August 06, 1940, 75 y.o.   MRN: [id number] TCTS DAILY ICU PROGRESS NOTE                   [address]            [place] [address]          [phone]   5 Days Post-Op Procedure(s) (LRB): VIDEO ASSISTED THORACOSCOPY (Left) LEFT UPPER LOBECTOMY (Left)  Total Length of Stay:  LOS: 5 days   Subjective: Feels well, ambulating  Objective: Vital signs in last 24 hours: Temp:  [97.6 F (36.4 C)-98.7 F (37.1 C)] 97.9 F (36.6 C) (10/25 0734) Pulse Rate:  [84-101] 92 (10/25 0734) Cardiac Rhythm: Normal sinus rhythm (10/25 0700) Resp:  [18-23] 20 (10/25 0734) BP: (118-162)/(59-97) 143/88 (10/25 0734) SpO2:  [93 %-99 %] 93 % (10/25 0734)  Filed Weights   02/03/16 0604  Weight: 184 lb (83.5 kg)    Weight change:    Hemodynamic parameters for last 24 hours:    Intake/Output from previous day: 10/24 0701 - 10/25 0700 In: 539 [P.O.:360; I.V.:179] Out: 1865 [Urine:1800; Chest Tube:65]  Intake/Output this shift: No intake/output data recorded.  Current Meds: Scheduled Meds: . acetaminophen  1,000 mg Oral Q6H   Or  . acetaminophen (TYLENOL) oral liquid 160 mg/5 mL  1,000 mg Oral Q6H  . aspirin EC  81 mg Oral Daily  . bisacodyl  10 mg Oral Daily  . diazepam  5 mg Oral BID  . enoxaparin (LOVENOX) injection  40 mg Subcutaneous Q24H  . fentaNYL   Intravenous Q4H  . insulin aspart  0-24 Units Subcutaneous Q6H  . levalbuterol  0.63 mg Nebulization BID  . lung surgery book   Does not apply Once  . metoprolol tartrate  12.5 mg Oral BID  . pantoprazole  40 mg Oral BID  . pravastatin  40 mg Oral q1800  . senna-docusate  1 tablet Oral QHS  . sodium chloride flush  10-40 mL Intracatheter Q12H   Continuous Infusions: . sodium chloride 10 mL/hr at 02/08/16 0554   PRN Meds:.diphenhydrAMINE **OR** diphenhydrAMINE, lactulose, magnesium citrate, naloxone **AND** sodium chloride flush, ondansetron (ZOFRAN) IV, potassium  chloride, sodium chloride flush, traMADol  General appearance: alert and cooperative Neurologic: intact Heart: regular rate and rhythm, S1, S2 normal, no murmur, click, rub or gallop Lungs: diminished breath sounds bibasilar Abdomen: soft, non-tender; bowel sounds normal; no masses,  no organomegaly Extremities: extremities normal, atraumatic, no cyanosis or edema and Homans sign is negative, no sign of DVT Wound: intact, still with 2+ air leak with cough  Lab Results: CBC:  Recent Labs  02/07/16 0431  WBC 7.8  HGB 11.9*  HCT 37.7  PLT 230   BMET:   Recent Labs  02/07/16 0431  NA 136  K 4.0  CL 99*  CO2 32  GLUCOSE 170*  BUN 10  CREATININE 0.60  CALCIUM 8.5*    CMET: Lab Results  Component Value Date   WBC 7.8 02/07/2016   HGB 11.9 (L) 02/07/2016   HCT 37.7 02/07/2016   PLT 230 02/07/2016   GLUCOSE 170 (H) 02/07/2016   ALT 15 02/05/2016   AST 18 02/05/2016   NA 136 02/07/2016   K 4.0 02/07/2016   CL 99 (L) 02/07/2016   CREATININE 0.60 02/07/2016   BUN 10 02/07/2016   CO2 32 02/07/2016   TSH 1.301 07/20/2013   INR 1.03 02/01/2016   HGBA1C 7.0 (  H) 02/01/2016    PT/INR: No results for input(s): LABPROT, INR in the last 72 hours. Radiology: Dg Chest Port 1 View  Result Date: 02/07/2016 CLINICAL DATA:  Postop from left upper lobectomy for lung carcinoma. Followup left pneumothorax. EXAM: PORTABLE CHEST 1 VIEW COMPARISON:  02/06/2016 FINDINGS: One left chest tube is been removed, while 1 remains in place. Small approximately 5-10% left apical pneumothorax shows no significant change in size. Left chest wall subcutaneous emphysema again demonstrated. No evidence of pulmonary infiltrate or edema. Heart size is within normal limits. Aortic atherosclerosis. Endovascular stent again seen in the proximal left common carotid or left subclavian artery. Right jugular central venous catheter remains in appropriate position. IMPRESSION: Stable small 5-10% left apical  pneumothorax. Electronically Signed   By: Earle Gell M.D.   On: 02/07/2016 09:05    xray today not read yet but appears unchanged   Assessment/Plan: S/P Procedure(s) (LRB): VIDEO ASSISTED THORACOSCOPY (Left) LEFT UPPER LOBECTOMY (Left) Mobilize leave chest tube in for now, until air leak resolved      Grace Isaac 02/08/2016 7:56 AM

## 2016-02-09 ENCOUNTER — Inpatient Hospital Stay (HOSPITAL_COMMUNITY): Payer: PPO

## 2016-02-09 LAB — GLUCOSE, CAPILLARY
GLUCOSE-CAPILLARY: 139 mg/dL — AB (ref 65–99)
GLUCOSE-CAPILLARY: 115 mg/dL — AB (ref 65–99)
Glucose-Capillary: 109 mg/dL — ABNORMAL HIGH (ref 65–99)
Glucose-Capillary: 112 mg/dL — ABNORMAL HIGH (ref 65–99)

## 2016-02-09 MED ORDER — DILTIAZEM HCL ER COATED BEADS 180 MG PO CP24
180.0000 mg | ORAL_CAPSULE | Freq: Every day | ORAL | Status: DC
  Administered 2016-02-09 – 2016-02-16 (×8): 180 mg via ORAL
  Filled 2016-02-09 (×8): qty 1

## 2016-02-09 NOTE — Progress Notes (Addendum)
[address]       [place] [address]             [phone]       6 Days Post-Op Procedure(s) (LRB): VIDEO ASSISTED THORACOSCOPY (Left) LEFT UPPER LOBECTOMY (Left)  Subjective: Patient given laxative for constipation. No specific complaints this am.  Objective: Vital signs in last 24 hours: Temp:  [97.7 F (36.5 C)-98.9 F (37.2 C)] 98.6 F (37 C) (10/26 0527) Pulse Rate:  [83-117] 94 (10/26 0527) Cardiac Rhythm: Sinus tachycardia (10/26 0004) Resp:  [15-30] 28 (10/26 0527) BP: (120-184)/(66-100) 156/78 (10/26 0527) SpO2:  [91 %-99 %] 91 % (10/26 0527)    Intake/Output from previous day: 10/25 0701 - 10/26 0700 In: 1164 [P.O.:940; I.V.:224] Out: 1700 [Urine:1650; Chest Tube:50]   Physical Exam:  Cardiovascular: Tachycardic Pulmonary: Clear to auscultation on right and coarse on left Abdomen: Soft, non tender, bowel sounds present. Wound: Clean and dry.  No erythema or signs of infection. There is drainage (serous) from previous chest tube removal. Chest Tube: to water seal, air leak  Lab Results: CBC: Recent Labs  02/07/16 0431  WBC 7.8  HGB 11.9*  HCT 37.7  PLT 230   BMET:  Recent Labs  02/07/16 0431  NA 136  K 4.0  CL 99*  CO2 32  GLUCOSE 170*  BUN 10  CREATININE 0.60  CALCIUM 8.5*    PT/INR: No results for input(s): LABPROT, INR in the last 72 hours. ABG:  INR: Will add last result for INR, ABG once components are confirmed Will add last 4 CBG results once components are confirmed  Assessment/Plan:  1. CV - Occasional ST in the 110's. On Lopressor 12.5 mg bid. Will restart low dose Cardizem XT (was on 300 mg pre op). 2.  Pulmonary - Chest tube output 50 cc last 24 hours. Chest tube is to water seal. There is an air leak. CXR this am shows small left apical pneumothorax and minor left lateral chest wall subcutaneous emphysema. Continue current management.  Encourage incentive spirometer.   ZIMMERMAN,DONIELLE  MPA-C 02/09/2016,7:21 AM  Persistent air leak I have seen and examined Caitlin Oliver and agree with the above assessment  and plan.  Grace Isaac MD Beeper [phone] Office [phone] 02/10/2016 3:22 PM

## 2016-02-09 NOTE — Progress Notes (Signed)
[address]       [place] [address]             [phone]       I received a page about an increase in the air leak of Ms. Cawood's chest tube. The patient never became short of breath but did have some new pain (sharp pains in her left side and dull pain in her chest and left shoulder). Her pain pump has been controling her pain. I ordered a chest xray which showed a stable left pneumothorax which was unchanged from this mornings study. Since there was an increase in drainage around the tube I checked the tube and stitch. It was secure without movement. There were no chest tube holes outside the incision that I could see. The position of the tube looked the same on chest xray. I redressed the chest tube and added a Vaseline gauze. She still does have a significant air leak but is currently asymptomatic and on room air. She does not feel short of breath. We will continue the chest tube on waterseal for now and obtain a chest xray in the morning to further evaluate. I asked the patient to please let the nurse know if she becomes suddenly short of breath.

## 2016-02-09 NOTE — Progress Notes (Signed)
Chest tube with a moderate air leak this AM. After ambulating in the hall at 1530, it was noted that patient had a much larger air leak. Also of significance, patient has had an increasing amount of drainage from CT site. Dressing has had to be changed twice this shift. PA on call notified. New orders for [facility] received and stated she would come up to evaluate patient when able. Patient has been asymptomatic and without issues.   Joellen Jersey, RN.

## 2016-02-09 NOTE — Discharge Summary (Signed)
Physician Discharge Summary  Patient ID: Caitlin Oliver MRN: [id number] DOB/AGE: 06/02/1940 75 y.o.  Admit date: 02/03/2016 Discharge date: 02/16/2016  Admission Diagnoses: Left upper lobe nodule Patient Active Problem List   Diagnosis Date Noted  . Cancer of left lung ([place]) 02/03/2016  . COPD GOLD II 12/08/2015  . Solitary pulmonary nodule 12/08/2015  . Bilateral occipital neuralgia 11/22/2015  . Double vision 10/28/2015  . TIA (transient ischemic attack) 10/28/2015  . Subclavian artery stenosis, left ([place]) 10/28/2015  . Subclavian steal syndrome 10/28/2015  . Acute blood loss anemia 10/20/2013  . C. difficile colitis 10/19/2013  . GI bleed 10/18/2013  . COPD 10/18/2013  . Bleeding gastrointestinal 10/18/2013  . Hypoxia 10/10/2013  . Bronchitis 10/08/2013  . PVD (peripheral vascular disease) with claudication ([place]) 07/29/2013  . Carotid stenosis 05/07/2013  . Carotid artery narrowing 05/07/2013  . Occlusion and stenosis of carotid artery without mention of cerebral infarction 03/02/2013  . Carotid artery obstruction 03/02/2013  . Carotid artery disease ([place]) 02/27/2013  . Tobacco abuse 02/27/2013  . Compulsive tobacco user syndrome 02/27/2013  . Claudication ([place]) 01/20/2013  . Essential hypertension 01/20/2013  . Type 2 diabetes mellitus ([place]) 01/20/2013  . Hyperlipidemia 01/20/2013   Discharge Diagnoses:  Stage IA squamous cell carcinoma left upper lobe Patient Active Problem List   Diagnosis Date Noted  . Cancer of left lung ([place]) 02/03/2016  . COPD GOLD II 12/08/2015  . Solitary pulmonary nodule 12/08/2015  . Bilateral occipital neuralgia 11/22/2015  . Double vision 10/28/2015  . TIA (transient ischemic attack) 10/28/2015  . Subclavian artery stenosis, left ([place]) 10/28/2015  . Subclavian steal syndrome 10/28/2015  . Acute blood loss anemia 10/20/2013  . C. difficile colitis 10/19/2013  . GI bleed 10/18/2013  . COPD 10/18/2013  . Bleeding gastrointestinal  10/18/2013  . Hypoxia 10/10/2013  . Bronchitis 10/08/2013  . PVD (peripheral vascular disease) with claudication ([place]) 07/29/2013  . Carotid stenosis 05/07/2013  . Carotid artery narrowing 05/07/2013  . Occlusion and stenosis of carotid artery without mention of cerebral infarction 03/02/2013  . Carotid artery obstruction 03/02/2013  . Carotid artery disease ([place]) 02/27/2013  . Tobacco abuse 02/27/2013  . Compulsive tobacco user syndrome 02/27/2013  . Claudication ([place]) 01/20/2013  . Essential hypertension 01/20/2013  . Type 2 diabetes mellitus ([place]) 01/20/2013  . Hyperlipidemia 01/20/2013    Patient Active Problem List   Diagnosis Date Noted  . Cancer of left lung ([place]) 02/03/2016  . COPD GOLD II 12/08/2015  . Solitary pulmonary nodule 12/08/2015  . Bilateral occipital neuralgia 11/22/2015  . Double vision 10/28/2015  . TIA (transient ischemic attack) 10/28/2015  . Subclavian artery stenosis, left ([place]) 10/28/2015  . Subclavian steal syndrome 10/28/2015  . Acute blood loss anemia 10/20/2013  . C. difficile colitis 10/19/2013  . GI bleed 10/18/2013  . COPD 10/18/2013  . Bleeding gastrointestinal 10/18/2013  . Hypoxia 10/10/2013  . Bronchitis 10/08/2013  . PVD (peripheral vascular disease) with claudication ([place]) 07/29/2013  . Carotid stenosis 05/07/2013  . Carotid artery narrowing 05/07/2013  . Occlusion and stenosis of carotid artery without mention of cerebral infarction 03/02/2013  . Carotid artery obstruction 03/02/2013  . Carotid artery disease ([place]) 02/27/2013  . Tobacco abuse 02/27/2013  . Compulsive tobacco user syndrome 02/27/2013  . Claudication ([place]) 01/20/2013  . Essential hypertension 01/20/2013  . Type 2 diabetes mellitus ([place]) 01/20/2013  . Hyperlipidemia 01/20/2013     Discharged Condition: good  History of present Illness:  Caitlin Oliver is a 75 year old former smoker  who has a past medical history of cardiovascular disease, hypertension,  hypercholesterolemia, tobacco abuse, COPD, reflux, peptic ulcer disease, gastrointestinal bleed, anxiety, and type 2 diabetes. She had an episode in June where she developed severe headaches nausea and dizziness. A CT of the head and neck showed stenosis of the right common carotid, distal right internal carotid, left ICA, and the left subclavian. She was thought to have subclavian steal. A subclavian stent was placed. The procedure was successful, but did not affect her headaches, dizziness, and nausea.  An incidental finding on the CT of the head and neck was a left upper lobe lung nodule. A CT chest was done 11/07/2015 that showed a 1.2 cm nodule. On PET CT this nodule was hypermetabolic with SUV of 14. She saw Caitlin Oliver and was started on [facility] and referred for surgery.  She was first evaluated by Caitlin Oliver on 01/10/2016.  At that visit he discussed the options of surgical resection versus stereotactic radiation. This included the relative advantages and disadvantages of each approach. He discussed the possibility of a bronchoscopic biopsy. However to due the possibility of false negative result with bronchoscopy, he felt surgical resection would be the best treatment approach.  The risks and benefits of the procedure were explained to the patient, but she remained anxious about having surgery.  She wished to think about things and presented for repeat evaluation on 01/24/2016.  Again during that visit he discussed risks and benefits of the procedure and the patient was agreeable to proceed.  Hospital Course:   Caitlin Oliver presented to [facility] on [facility]/03/2016.  She was taken to the operating room and underwent Left Video Assisted Thoracoscopy with Left upper lobectomy, mediastinal lymph node sampling, and ON- Q catheter placement.  She tolerated the procedure without difficulty, was extubated, and taken to the PACU in stable condition.  The patient's chest tube showed evidence of an air leak  on POD #1 and remained on suction.  CXR also demonstrated a left apical pneumothorax.  Her arterial line was removed without difficulty.  POD #2 her chest tubes continued to have an air leak, but her pneumothorax improved on CXR.  Her chest tubes were placed on water seal.  She was tolerating a diet without nausea or vomiting and her IV fluid was discontinued.  POD #3 her posterior chest tube was removed.  Her final chest tube had an air leak and remained in placel over the next several days.  It was able to be removed on 11/1.  Follow up CXR showed a small pneumothorax.  She developed sinus tachycardia and was treated with Lopressor and started on her home regimen of Cardizem which was titrated as BP and HR allowed.  She is ambulating without difficulty.  Her pain is well controlled.  She is tolerating a diet.  She is felt medically stable for discharge home today.  Her final pathology is pasted below.          Significant Diagnostic Studies: Pathology  1. Lung, biopsy, Left Upper Lobe - BENIGN LUNG TISSUE AND BRONCHIAL MUCOSA. - NO EVIDENCE OF MALIGNANCY. 2. Lung, resection (segmental or lobe), Left Upper Lobe Wedge - SQUAMOUS CELL CARCINOMA, 2.0 CM. - MARGINS NOT INVOLVED. - TWO ANTHRACOTIC LYMPH NODES (0/2). - SEE ONCOLOGY TABLE BELOW. 3. Lymph node, biopsy, Level 9 - ANTHRACOTIC LYMPH NODE. - NO TUMOR IDENTIFIED. 4. Lymph node, biopsy, Level 11L - ANTHRACOTIC LYMPH NODE. - NO TUMOR IDENTIFIED. 5. Lymph node, biopsy, Level 11L #2 -  ANTHRACOTIC LYMPH NODE. - NO TUMOR IDENTIFIED. 6. Lymph node, biopsy, Level 12L - ANTHRACOTIC LYMPH NODE. - NO TUMOR IDENTIFIED. 7. Lymph node, biopsy, Level 12L #2 - ANTHRACOTIC LYMPH NODE. - NO TUMOR IDENTIFIED. 8. Lymph node, biopsy, Level 12L #3 - ANTHRACOTIC LYMPH NODE. - NO TUMOR IDENTIFIED. 9. Lymph node, biopsy, 5 - ANTHRACOTIC LYMPH NODE. - NO TUMOR IDENTIFIED. 10. Lymph node, biopsy, 5 #2 - ANTHRACOTIC LYMPH NODE. - NO TUMOR  IDENTIFIED.  Treatments: surgery:   Left video-assisted thoracoscopy, thoracoscopic left upper lobectomy, mediastinal lymph node sampling, and On-Q local anesthetic catheter placement.  Disposition: 01-Home or Self Care   Discharge Medications:  Discharge Instructions    AMB Referral to [facility] Care Management    Complete by:  As directed    Reason for consult:  Post hospital follow up - HTA member   Expected date of contact:  1-3 days (reserved for hospital discharges)   Please assign to community nurse for transition of care calls and assess for home visits. [employer] member, hospital referral..  Questions please call:   Natividad Brood, RN BSN [facility] Liaison  [phone] business mobile phone Toll free office [phone]       Medication List    STOP taking these medications   acetaminophen 500 MG tablet Commonly known as:  TYLENOL   BIOTIN MAXIMUM STRENGTH 5 MG Caps Generic drug:  Biotin   CARTIA XT 300 MG 24 hr capsule Generic drug:  diltiazem   GOODYS EXTRA STRENGTH 520-260-32.5 MG Pack Generic drug:  Aspirin-Acetaminophen-Caffeine   HYDROcodone-acetaminophen 5-325 MG tablet Commonly known as:  NORCO/VICODIN   ondansetron 4 MG tablet Commonly known as:  ZOFRAN   Tiotropium Bromide-Olodaterol 2.5-2.5 MCG/ACT Aers Commonly known as:  STIOLTO RESPIMAT   tiZANidine 2 MG tablet Commonly known as:  ZANAFLEX     TAKE these medications   aspirin EC 81 MG tablet Take 81 mg by mouth daily.   diazepam 5 MG tablet Commonly known as:  VALIUM Take 5 mg by mouth 2 (two) times daily. Anxiety   diltiazem 180 MG 24 hr capsule Commonly known as:  DILT-XR Take 1 capsule (180 mg total) by mouth daily.   fish oil-omega-3 fatty acids 1000 MG capsule Take 1 g by mouth daily.   metFORMIN 500 MG tablet Commonly known as:  GLUCOPHAGE Take 500 mg by mouth daily with breakfast.   metoprolol tartrate 25 MG tablet Commonly known as:   LOPRESSOR Take 0.5 tablets (12.5 mg total) by mouth 2 (two) times daily. What changed:  how much to take  when to take this   naphazoline-glycerin 0.012-0.2 % Soln Commonly known as:  CLEAR EYES Place 1-2 drops into both eyes 4 (four) times daily as needed (for itchy eyes).   NONFORMULARY OR COMPOUNDED ITEM [facility]:  Combo Pain #1 - Baclofen 3%, Gabapentin 8%, Aripiprazole 2.7%, Ketorolac 8%, Licocaine 5%, dispense 240grams, apply 1-2 pumps to focal pain area 3-4 times daily, rub in 1-2 minutes, +3Refills What changed:  how much to take  how to take this  when to take this  reasons to take this  additional instructions   oxyCODONE 5 MG immediate release tablet Commonly known as:  Oxy IR/ROXICODONE Take 1 tablet (5 mg total) by mouth every 6 (six) hours as needed for severe pain.   pantoprazole 40 MG tablet Commonly known as:  PROTONIX Take 40 mg by mouth 2 (two) times daily.   pravastatin 40 MG tablet Commonly known  as:  PRAVACHOL Take 40 mg by mouth daily.        SignedJadene Pierini E 02/16/2016, 1:44 PM

## 2016-02-10 ENCOUNTER — Inpatient Hospital Stay (HOSPITAL_COMMUNITY): Payer: PPO

## 2016-02-10 LAB — GLUCOSE, CAPILLARY
GLUCOSE-CAPILLARY: 121 mg/dL — AB (ref 65–99)
Glucose-Capillary: 109 mg/dL — ABNORMAL HIGH (ref 65–99)
Glucose-Capillary: 115 mg/dL — ABNORMAL HIGH (ref 65–99)
Glucose-Capillary: 121 mg/dL — ABNORMAL HIGH (ref 65–99)

## 2016-02-10 NOTE — Plan of Care (Signed)
Problem: Activity: Goal: Risk for activity intolerance will decrease Outcome: Progressing Patient ambulated in hall x2, tolerated well.

## 2016-02-10 NOTE — Consult Note (Signed)
   [facility] CM Inpatient Consult   02/10/2016  Caitlin Oliver December 04, 1940 [id number]  Patient evaluated for community based chronic disease management services with [facility] as a benefit of patient's [employer] Medicare Insurance. Chart review reveals [name] is a 75 year old former smoker, who was being evaluated for headaches, dizziness and nausea.  A CT scan of the head and neck showed a left upper lobe nodule.  A chest CT showed 1.2 cm nodule on PET.  The nodule was hypermetabolic with an SUV of 14.  She saw Dr. Christinia Gully, and was referred for surgery.  We discussed the possibility of surgical resection versus radiation therapy. Patient has a chest tube still to water seal currently.  Spoke with patient at bedside to explain [place] [facility] Management services. Consent form signed.   Patient will receive post hospital discharge call and will be evaluated for monthly home visits for assessments and disease process education.  Left contact information and [facility] literature at bedside. Made Inpatient Case Manager aware that [facility] Management following. Of note, [facility] Care Management services does not replace or interfere with any services that are arranged by inpatient case management or social work.  For additional questions or referrals please contact:   Natividad Brood, RN BSN [facility] Liaison  [phone] business mobile phone Toll free office [phone]

## 2016-02-10 NOTE — Progress Notes (Addendum)
[address]       [place] [address]             [phone]       7 Days Post-Op Procedure(s) (LRB): VIDEO ASSISTED THORACOSCOPY (Left) LEFT UPPER LOBECTOMY (Left)  Subjective: She has already walked this am and had a bowel movement.  Objective: Vital signs in last 24 hours: Temp:  [97.3 F (36.3 C)-98.3 F (36.8 C)] 98 F (36.7 C) (10/27 0728) Pulse Rate:  [73-98] 89 (10/27 0728) Cardiac Rhythm: Normal sinus rhythm (10/27 0300) Resp:  [16-20] 19 (10/27 0728) BP: (111-137)/(69-83) 126/79 (10/27 0728) SpO2:  [95 %-97 %] 95 % (10/27 0728)    Intake/Output from previous day: 10/26 0701 - 10/27 0700 In: 1800 [P.O.:1560; I.V.:240] Out: 280 [Urine:250; Chest Tube:30]   Physical Exam:  Cardiovascular: Slightly tachycardic Pulmonary: Clear to auscultation on right and coarse on left Wound: Clean and dry.  No erythema or signs of infection. There is drainage (serous) from previous chest tube removal. Suture is tight but skin is "puckered" by suture.  Chest Tube: to water seal, persistent air leak  Lab Results: CBC:No results for input(s): WBC, HGB, HCT, PLT in the last 72 hours. BMET: No results for input(s): NA, K, CL, CO2, GLUCOSE, BUN, CREATININE, CALCIUM in the last 72 hours.  PT/INR: No results for input(s): LABPROT, INR in the last 72 hours. ABG:  INR: Will add last result for INR, ABG once components are confirmed Will add last 4 CBG results once components are confirmed  Assessment/Plan:  1. CV - Occasional ST in the low 100's. On Lopressor 12.5 mg bid and low dose Cardizem XT 180 mg daily(was on 300 mg pre op). 2.  Pulmonary - Chest tube output 30 cc last 24 hours. Chest tube is to water seal. There is a persistent air leak. CXR this am appears stable (small left apical pneumothorax and minor left lateral chest wall subcutaneous emphysema.) Continue current chest tube management.  Encourage incentive spirometer. Regarding chest tube  drainage, serous drainage this am. Chest tube suture is tight but skin is puckered in corner. Continue dressing changes as needed.   ZIMMERMAN,DONIELLE MPA-C 02/10/2016,8:21 AM  Still with air leak Leave chest tube in place for now, will put back to suction I have seen and examined [name] and agree with the above assessment  and plan.  Grace Isaac MD Beeper [phone] Office [phone] 02/10/2016 3:21 PM

## 2016-02-10 NOTE — Care Management Important Message (Signed)
Important Message  Patient Details  Name: Caitlin Oliver MRN: [id number] Date of Birth: Apr 08, 1941   Medicare Important Message Given:  Yes    [name] May 02/10/2016, 2:45 PM

## 2016-02-10 NOTE — Progress Notes (Signed)
Wasting 3 mL of PCA fentanyl in sink. Witnessed by Sylvan Cheese.

## 2016-02-10 NOTE — Care Management Note (Signed)
Case Management Note  Patient Details  Name: [name] MRN: [id number] Date of Birth: March 17, 1941  Subjective/Objective:     S/p Vats, lobectomy, has one chest tube to water seal,  -Still has an air leak , patient has decided to sign up with [facility].  NCM will continue to follow for dc needs.                 Action/Plan:   Expected Discharge Date:                  Expected Discharge Plan:  Home/Self Care  In-House Referral:     Discharge planning Services  CM Consult  Post Acute Care Choice:    Choice offered to:     DME Arranged:    DME Agency:     HH Arranged:    HH Agency:     Status of Service:  Completed, signed off  If discussed at [employer] of Stay Meetings, dates discussed:    Additional Comments:  Zenon Mayo, RN 02/10/2016, 4:51 PM

## 2016-02-11 ENCOUNTER — Inpatient Hospital Stay (HOSPITAL_COMMUNITY): Payer: PPO

## 2016-02-11 LAB — GLUCOSE, CAPILLARY
GLUCOSE-CAPILLARY: 133 mg/dL — AB (ref 65–99)
GLUCOSE-CAPILLARY: 119 mg/dL — AB (ref 65–99)
Glucose-Capillary: 105 mg/dL — ABNORMAL HIGH (ref 65–99)
Glucose-Capillary: 162 mg/dL — ABNORMAL HIGH (ref 65–99)

## 2016-02-11 NOTE — Progress Notes (Addendum)
[address]       [place], [address]             [phone]       8 Days Post-Op Procedure(s) (LRB): VIDEO ASSISTED THORACOSCOPY (Left) LEFT UPPER LOBECTOMY (Left)  Subjective: She "dozed off this am". No complaints.  Objective: Vital signs in last 24 hours: Temp:  [98.1 F (36.7 C)-98.4 F (36.9 C)] 98.2 F (36.8 C) (10/28 0723) Pulse Rate:  [78-94] 88 (10/28 0723) Cardiac Rhythm: Normal sinus rhythm (10/28 0726) Resp:  [16-22] 19 (10/28 0723) BP: (101-143)/(54-70) 143/70 (10/28 0723) SpO2:  [92 %-98 %] 97 % (10/28 0747)    Intake/Output from previous day: 10/27 0701 - 10/28 0700 In: 1226.2 [P.O.:960; I.V.:266.2] Out: 1675 [Urine:1475; Chest Tube:200]   Physical Exam:  Cardiovascular: RRR Pulmonary: Clear to auscultation on right and coarse on left Wound: Clean and dry.  No erythema or signs of infection. Dressing around chest tube is clean and dry.  Chest Tube: to suction, persistent air leak  Lab Results: CBC:No results for input(s): WBC, HGB, HCT, PLT in the last 72 hours. BMET: No results for input(s): NA, K, CL, CO2, GLUCOSE, BUN, CREATININE, CALCIUM in the last 72 hours.  PT/INR: No results for input(s): LABPROT, INR in the last 72 hours. ABG:  INR: Will add last result for INR, ABG once components are confirmed Will add last 4 CBG results once components are confirmed  Assessment/Plan:  1. CV - Occasional ST in the low 100's. On Lopressor 12.5 mg bid and low dose Cardizem XT 180 mg daily(was on 300 mg pre op). 2.  Pulmonary - Chest tube output 200 cc last 24 hours. Chest tube was put back to suction yesterday. There is a persistent air leak. CXR this am appears stable (small left apical pneumothorax ) Continue current chest tube management.  Encourage incentive spirometer. Regarding chest tube drainage, dressing was just changed and is dry.  Continue dressing changes as needed.   ZIMMERMAN,DONIELLE MPA-C 02/11/2016,9:13  AM  Chest xray better, still small air  leak , few bubbles one column  Leave tube to suction now  I have seen and examined Caitlin Oliver and agree with the above assessment  and plan.  Grace Isaac MD Beeper [phone] Office [phone] 02/11/2016 12:24 PM

## 2016-02-12 ENCOUNTER — Inpatient Hospital Stay (HOSPITAL_COMMUNITY): Payer: PPO

## 2016-02-12 LAB — GLUCOSE, CAPILLARY
GLUCOSE-CAPILLARY: 128 mg/dL — AB (ref 65–99)
GLUCOSE-CAPILLARY: 168 mg/dL — AB (ref 65–99)
Glucose-Capillary: 122 mg/dL — ABNORMAL HIGH (ref 65–99)
Glucose-Capillary: 126 mg/dL — ABNORMAL HIGH (ref 65–99)
Glucose-Capillary: 120 mg/dL — ABNORMAL HIGH (ref 65–99)

## 2016-02-12 MED ORDER — MUSCLE RUB 10-15 % EX CREA
TOPICAL_CREAM | CUTANEOUS | Status: DC | PRN
  Filled 2016-02-12: qty 85

## 2016-02-12 MED ORDER — INSULIN ASPART 100 UNIT/ML ~~LOC~~ SOLN
0.0000 [IU] | Freq: Four times a day (QID) | SUBCUTANEOUS | Status: DC
Start: 2016-02-13 — End: 2016-02-12

## 2016-02-12 MED ORDER — OXYCODONE HCL 5 MG PO TABS
5.0000 mg | ORAL_TABLET | ORAL | Status: DC | PRN
  Administered 2016-02-12 – 2016-02-16 (×16): 5 mg via ORAL
  Filled 2016-02-12 (×16): qty 1

## 2016-02-12 MED ORDER — INSULIN ASPART 100 UNIT/ML ~~LOC~~ SOLN: 0.0000 [IU] | Freq: Four times a day (QID) | SUBCUTANEOUS | Status: DC

## 2016-02-12 MED ORDER — INSULIN ASPART 100 UNIT/ML ~~LOC~~ SOLN
0.0000 [IU] | Freq: Three times a day (TID) | SUBCUTANEOUS | Status: DC
  Administered 2016-02-13 – 2016-02-16 (×9): 2 [IU] via SUBCUTANEOUS
  Administered 2016-02-16: 4 [IU] via SUBCUTANEOUS

## 2016-02-12 NOTE — Progress Notes (Addendum)
[address]       [place] [address]             [phone]       9 Days Post-Op Procedure(s) (LRB): VIDEO ASSISTED THORACOSCOPY (Left) LEFT UPPER LOBECTOMY (Left)  Subjective: She has no complaints-except really wants "to get out of here"  Objective: Vital signs in last 24 hours: Temp:  [97.5 F (36.4 C)-98.4 F (36.9 C)] 98 F (36.7 C) (10/29 0650) Pulse Rate:  [79-97] 82 (10/29 0650) Cardiac Rhythm: Normal sinus rhythm (10/29 0730) Resp:  [15-27] 27 (10/29 0650) BP: (106-138)/(60-95) 111/74 (10/29 0650) SpO2:  [93 %-99 %] 95 % (10/29 0904)    Intake/Output from previous day: 10/28 0701 - 10/29 0700 In: 963.8 [P.O.:840; I.V.:123.8] Out: 2250 [Urine:2100; Chest Tube:150]   Physical Exam:  Cardiovascular: RRR Pulmonary: Clear to auscultation on right and coarse on left Wound: Clean and dry.  No erythema or signs of infection. Dressing around chest tube has scant serous drainage. Chest Tube: to suction, small +1 air leak  Lab Results: CBC:No results for input(s): WBC, HGB, HCT, PLT in the last 72 hours. BMET: No results for input(s): NA, K, CL, CO2, GLUCOSE, BUN, CREATININE, CALCIUM in the last 72 hours.  PT/INR: No results for input(s): LABPROT, INR in the last 72 hours. ABG:  INR: Will add last result for INR, ABG once components are confirmed Will add last 4 CBG results once components are confirmed  Assessment/Plan:  1. CV - Occasional ST in the low 100's. On Lopressor 12.5 mg bid and low dose Cardizem XT 180 mg daily(was on 300 mg pre op). 2.  Pulmonary - Chest tube output 150 cc last 24 hours. Chest tube on suction. There is a small +1 air leak. CXR this am appears stable (small left apical pneumothorax ) Continue current chest tube management.  Encourage incentive spirometer.   ZIMMERMAN,DONIELLE MPA-C 02/12/2016,9:14 AM   More pain around the chest tube and back Convert to po pain meds and off pca Still with small air leak  , suction today and poss back to water seal tomorrow  Then decide removal of tube vs home with [employer] I have seen and examined Caitlin Oliver and agree with the above assessment  and plan.  Grace Isaac MD Beeper [phone] Office [phone] 02/12/2016 10:30 AM

## 2016-02-13 ENCOUNTER — Inpatient Hospital Stay (HOSPITAL_COMMUNITY): Payer: PPO

## 2016-02-13 LAB — GLUCOSE, CAPILLARY
GLUCOSE-CAPILLARY: 144 mg/dL — AB (ref 65–99)
GLUCOSE-CAPILLARY: 133 mg/dL — AB (ref 65–99)
Glucose-Capillary: 159 mg/dL — ABNORMAL HIGH (ref 65–99)
Glucose-Capillary: 132 mg/dL — ABNORMAL HIGH (ref 65–99)

## 2016-02-13 MED ORDER — LEVALBUTEROL HCL 0.63 MG/3ML IN NEBU
0.6300 mg | INHALATION_SOLUTION | Freq: Four times a day (QID) | RESPIRATORY_TRACT | Status: DC | PRN
  Administered 2016-02-13: 0.63 mg via RESPIRATORY_TRACT
  Filled 2016-02-13: qty 3

## 2016-02-13 NOTE — Progress Notes (Addendum)
[address]       [place] [address]             [phone]      10 Days Post-Op Procedure(s) (LRB): VIDEO ASSISTED THORACOSCOPY (Left) LEFT UPPER LOBECTOMY (Left) Subjective: Feeling well  Objective: Vital signs in last 24 hours: Temp:  [97.3 F (36.3 C)-99.2 F (37.3 C)] 98.1 F (36.7 C) (10/30 0732) Pulse Rate:  [77-101] 101 (10/30 0907) Cardiac Rhythm: Sinus tachycardia (10/30 0746) Resp:  [18-27] 27 (10/30 0732) BP: (100-130)/(63-84) 130/75 (10/30 0907) SpO2:  [93 %-97 %] 95 % (10/30 0815)  Hemodynamic parameters for last 24 hours:    Intake/Output from previous day: 10/29 0701 - 10/30 0700 In: 1490 [P.O.:1320; I.V.:170] Out: 1545 [Urine:1300; Chest Tube:245] Intake/Output this shift: No intake/output data recorded.  General appearance: alert, cooperative and no distress Heart: regular rate and rhythm Lungs: somewhat coarse throughout but moving air well Abdomen: soft, non-tender Extremities: no edema Wound: incis healing well  Lab Results: No results for input(s): WBC, HGB, HCT, PLT in the last 72 hours. BMET: No results for input(s): NA, K, CL, CO2, GLUCOSE, BUN, CREATININE, CALCIUM in the last 72 hours.  PT/INR: No results for input(s): LABPROT, INR in the last 72 hours. ABG    Component Value Date/Time   PHART 7.327 (L) 02/04/2016 0600   HCO3 28.2 (H) 02/04/2016 0600   TCO2 27 11/15/2015 0811   ACIDBASEDEF 0.5 02/01/2016 1400   O2SAT 93.6 02/04/2016 0600   CBG (last 3)   Recent Labs  02/12/16 1837 02/12/16 2118 02/13/16 0758  GLUCAP 168* 120* 159*    Meds Scheduled Meds: . acetaminophen  1,000 mg Oral Q6H   Or  . acetaminophen (TYLENOL) oral liquid 160 mg/5 mL  1,000 mg Oral Q6H  . aspirin EC  81 mg Oral Daily  . bisacodyl  10 mg Oral Daily  . diazepam  5 mg Oral BID  . diltiazem  180 mg Oral Daily  . enoxaparin (LOVENOX) injection  40 mg Subcutaneous Q24H  . insulin aspart  0-24 Units Subcutaneous TID WC    . levalbuterol  0.63 mg Nebulization BID  . lung surgery book   Does not apply Once  . metoprolol tartrate  12.5 mg Oral BID  . pantoprazole  40 mg Oral BID  . pravastatin  40 mg Oral q1800  . senna-docusate  1 tablet Oral QHS  . sodium chloride flush  10-40 mL Intracatheter Q12H   Continuous Infusions: . sodium chloride Stopped (02/12/16 1200)   PRN Meds:.lactulose, magnesium citrate, MUSCLE RUB, ondansetron (ZOFRAN) IV, oxyCODONE, potassium chloride, sodium chloride flush, traMADol  Xrays Dg Chest Port 1 View  Result Date: 02/13/2016 CLINICAL DATA:  Shortness of Breath EXAM: PORTABLE CHEST 1 VIEW COMPARISON:  02/12/2016 FINDINGS: Left-sided chest tube is again seen. Tiny left apical pneumothorax is again seen and stable. No new focal infiltrate or sizable effusion is noted. Aortic calcifications are again seen. Cardiac shadow is stable. IMPRESSION: Tiny residual apical pneumothorax on the left. This is unchanged from the prior exam. Electronically Signed   By: Inez Catalina M.D.   On: 02/13/2016 07:40   Dg Chest Port 1 View  Result Date: 02/12/2016 CLINICAL DATA:  Left upper lobectomy 02/03/2016, follow-up pneumothorax EXAM: PORTABLE CHEST 1 VIEW COMPARISON:  Chest radiograph from one day prior. FINDINGS: Stable position of left apical chest tube. Stable cardiomediastinal silhouette with normal heart size and aortic atherosclerosis. No right pneumothorax. Stable tiny left  apical pneumothorax (less than 5%). No pleural effusion. Stable volume loss in the left hemithorax. No pulmonary edema. Mild left basilar opacity is stable. Mild subcutaneous emphysema in the lateral left chest wall is stable. IMPRESSION: 1. Stable tiny left apical pneumothorax. 2. Stable mild left lung base opacity, favor atelectasis. 3. Aortic atherosclerosis. Electronically Signed   By: Ilona Sorrel M.D.   On: 02/12/2016 09:39    Assessment/Plan: S/P Procedure(s) (LRB): VIDEO ASSISTED THORACOSCOPY (Left) LEFT UPPER  LOBECTOMY (Left)  1 doing well. I currently don't see air leak with cough. CXR stable in appearance. Poss Change to H2O seal but she requests Dr Roxan Hockey to make that decision   LOS: 10 days    GOLD,WAYNE E 02/13/2016   Chest tube back to water seal today, no air leak this afternnoon. I have seen and examined [name] and agree with the above assessment  and plan.  Grace Isaac MD [facility] [phone] Office [phone] 02/13/2016 3:53 PM

## 2016-02-14 ENCOUNTER — Inpatient Hospital Stay (HOSPITAL_COMMUNITY): Payer: PPO

## 2016-02-14 ENCOUNTER — Encounter (HOSPITAL_COMMUNITY): Payer: Self-pay | Admitting: Certified Registered Nurse Anesthetist

## 2016-02-14 LAB — GLUCOSE, CAPILLARY
GLUCOSE-CAPILLARY: 142 mg/dL — AB (ref 65–99)
Glucose-Capillary: 121 mg/dL — ABNORMAL HIGH (ref 65–99)
Glucose-Capillary: 116 mg/dL — ABNORMAL HIGH (ref 65–99)

## 2016-02-14 MED ORDER — ONDANSETRON HCL 4 MG PO TABS
4.0000 mg | ORAL_TABLET | Freq: Three times a day (TID) | ORAL | Status: DC | PRN
  Administered 2016-02-14 – 2016-02-16 (×3): 4 mg via ORAL
  Filled 2016-02-14 (×4): qty 1

## 2016-02-14 NOTE — Progress Notes (Signed)
Patient's [place] pleuravac was replaced a this time.  Basil Dess, RN

## 2016-02-14 NOTE — Progress Notes (Signed)
[address]       [place] [address]             [phone]      No complaints this afternoon  With new pleuravac in place and on suction- no air leak seen. Placed to water seal  Check CXR in AM  Hopefully can dc CT in AM. If not will take to OR for bronch and endobronchial valve placement. I informed her of the general nature of the procedure and the need for an additional procedure to remove the valves later. I informed her the indications, risks, benefits and alternatives. She understands the risks include but are not limited to valve misplacement, failure to stop the air leak as well as general risks such as DVT, PE, MI, etc.  Will reassess in AM to make final decision  Remo Lipps C. Roxan Hockey, MD [facility] [phone]

## 2016-02-14 NOTE — Progress Notes (Addendum)
[address]       [place] [address]             [phone]      11 Days Post-Op Procedure(s) (LRB): VIDEO ASSISTED THORACOSCOPY (Left) LEFT UPPER LOBECTOMY (Left) Subjective: CXR shows increase in pntx. She does feel a bit more SOB  Objective: Vital signs in last 24 hours: Temp:  [97.4 F (36.3 C)-98.8 F (37.1 C)] 98.4 F (36.9 C) (10/31 0700) Pulse Rate:  [93-107] 107 (10/31 0700) Cardiac Rhythm: Sinus tachycardia (10/31 0318) Resp:  [20-31] 25 (10/31 0700) BP: (117-130)/(57-81) 129/81 (10/31 0700) SpO2:  [94 %-98 %] 95 % (10/31 0700)  Hemodynamic parameters for last 24 hours:    Intake/Output from previous day: 10/30 0701 - 10/31 0700 In: 1120 [P.O.:1120] Out: 2020 [Urine:1900; Chest Tube:120] Intake/Output this shift: No intake/output data recorded.  General appearance: alert, cooperative and no distress Heart: regular rate and rhythm and tachy Lungs: mildly dim in bases Abdomen: benign Extremities: minor LE edema Wound: incis ok  Lab Results: No results for input(s): WBC, HGB, HCT, PLT in the last 72 hours. BMET: No results for input(s): NA, K, CL, CO2, GLUCOSE, BUN, CREATININE, CALCIUM in the last 72 hours.  PT/INR: No results for input(s): LABPROT, INR in the last 72 hours. ABG    Component Value Date/Time   PHART 7.327 (L) 02/04/2016 0600   HCO3 28.2 (H) 02/04/2016 0600   TCO2 27 11/15/2015 0811   ACIDBASEDEF 0.5 02/01/2016 1400   O2SAT 93.6 02/04/2016 0600   CBG (last 3)   Recent Labs  02/13/16 1703 02/13/16 2112 02/14/16 0735  GLUCAP 132* 133* 142*    Meds Scheduled Meds: . aspirin EC  81 mg Oral Daily  . bisacodyl  10 mg Oral Daily  . diazepam  5 mg Oral BID  . diltiazem  180 mg Oral Daily  . enoxaparin (LOVENOX) injection  40 mg Subcutaneous Q24H  . insulin aspart  0-24 Units Subcutaneous TID WC  . lung surgery book   Does not apply Once  . metoprolol tartrate  12.5 mg Oral BID  . pantoprazole  40 mg  Oral BID  . pravastatin  40 mg Oral q1800  . senna-docusate  1 tablet Oral QHS  . sodium chloride flush  10-40 mL Intracatheter Q12H   Continuous Infusions: . sodium chloride Stopped (02/12/16 1200)   PRN Meds:.lactulose, levalbuterol, magnesium citrate, MUSCLE RUB, ondansetron (ZOFRAN) IV, oxyCODONE, potassium chloride, sodium chloride flush, traMADol  Xrays Dg Chest Port 1 View  Result Date: 02/13/2016 CLINICAL DATA:  Shortness of Breath EXAM: PORTABLE CHEST 1 VIEW COMPARISON:  02/12/2016 FINDINGS: Left-sided chest tube is again seen. Tiny left apical pneumothorax is again seen and stable. No new focal infiltrate or sizable effusion is noted. Aortic calcifications are again seen. Cardiac shadow is stable. IMPRESSION: Tiny residual apical pneumothorax on the left. This is unchanged from the prior exam. Electronically Signed   By: Inez Catalina M.D.   On: 02/13/2016 07:40    Assessment/Plan: S/P Procedure(s) (LRB): VIDEO ASSISTED THORACOSCOPY (Left) LEFT UPPER LOBECTOMY (Left)  1 increase in pntx- place back on suction   LOS: 11 days    GOLD,WAYNE E 02/14/2016  Patient seen and examined, agree with above Chest xray shows a sizable left pneumo, much larger than any previously On examining the pleuravac it looks like she has been sucking air back into the chest. Wound redressed with vaseline gauze and will change pleuravac out I suspect  pneumo is more a function of entraining air and pleuravac issues than a large air leak  Remo Lipps C. Roxan Hockey, MD [facility] [phone]

## 2016-02-14 NOTE — Care Management Important Message (Signed)
Important Message  Patient Details  Name: Caitlin Oliver MRN: [id number] Date of Birth: 12-15-1940   Medicare Important Message Given:  Yes    Britania Shreeve Abena 02/14/2016, 11:41 AM

## 2016-02-15 ENCOUNTER — Encounter (HOSPITAL_COMMUNITY)
Admission: RE | Disposition: A | Discharge status: Home / Self Care | Payer: Self-pay | Source: Ambulatory Visit | Attending: Thoracic Surgery (Cardiothoracic Vascular Surgery)

## 2016-02-15 ENCOUNTER — Inpatient Hospital Stay (HOSPITAL_COMMUNITY): Payer: PPO

## 2016-02-15 LAB — GLUCOSE, CAPILLARY
GLUCOSE-CAPILLARY: 157 mg/dL — AB (ref 65–99)
GLUCOSE-CAPILLARY: 158 mg/dL — AB (ref 65–99)
Glucose-Capillary: 152 mg/dL — ABNORMAL HIGH (ref 65–99)
Glucose-Capillary: 163 mg/dL — ABNORMAL HIGH (ref 65–99)

## 2016-02-15 SURGERY — BRONCHOSCOPY, VIDEO-ASSISTED
Anesthesia: General

## 2016-02-15 NOTE — Progress Notes (Signed)
12 Days Post-Op Procedure(s) (LRB): VIDEO ASSISTED THORACOSCOPY (Left) LEFT UPPER LOBECTOMY (Left) Subjective: C/o pain from CT  Objective: Vital signs in last 24 hours: Temp:  [98.4 F (36.9 C)-98.9 F (37.2 C)] 98.9 F (37.2 C) (11/01 0405) Pulse Rate:  [84-107] 84 (11/01 0405) Cardiac Rhythm: Normal sinus rhythm (11/01 0405) Resp:  [19-26] 23 (11/01 0405) BP: (107-145)/(63-80) 107/71 (11/01 0405) SpO2:  [94 %-97 %] 94 % (11/01 0405)  Hemodynamic parameters for last 24 hours:    Intake/Output from previous day: 10/31 0701 - 11/01 0700 In: 300 [P.O.:290; I.V.:10] Out: 1290 [Urine:1250; Chest Tube:40] Intake/Output this shift: No intake/output data recorded.  General appearance: alert, cooperative and no distress Neurologic: intact Heart: regular rate and rhythm Lungs: diminished breath sounds left base no air leak  Lab Results: No results for input(s): WBC, HGB, HCT, PLT in the last 72 hours. BMET: No results for input(s): NA, K, CL, CO2, GLUCOSE, BUN, CREATININE, CALCIUM in the last 72 hours.  PT/INR: No results for input(s): LABPROT, INR in the last 72 hours. ABG    Component Value Date/Time   PHART 7.327 (L) 02/04/2016 0600   HCO3 28.2 (H) 02/04/2016 0600   TCO2 27 11/15/2015 0811   ACIDBASEDEF 0.5 02/01/2016 1400   O2SAT 93.6 02/04/2016 0600   CBG (last 3)   Recent Labs  02/14/16 0735 02/14/16 1205 02/14/16 1710  GLUCAP 142* 121* 116*    Assessment/Plan: S/P Procedure(s) (LRB): VIDEO ASSISTED THORACOSCOPY (Left) LEFT UPPER LOBECTOMY (Left) -no air leak on exam and CXR oK Dc chest tube Recheck CXR today and again in AM   LOS: 12 days    [name] 02/15/2016

## 2016-02-16 ENCOUNTER — Inpatient Hospital Stay (HOSPITAL_COMMUNITY): Payer: PPO

## 2016-02-16 LAB — GLUCOSE, CAPILLARY
GLUCOSE-CAPILLARY: 135 mg/dL — AB (ref 65–99)
Glucose-Capillary: 182 mg/dL — ABNORMAL HIGH (ref 65–99)

## 2016-02-16 MED ORDER — DILTIAZEM HCL ER 180 MG PO CP24: 180.0000 mg | ORAL_CAPSULE | Freq: Every day | ORAL | 1 refills | Status: DC

## 2016-02-16 MED ORDER — METOPROLOL TARTRATE 25 MG PO TABS: 12.5000 mg | ORAL_TABLET | Freq: Two times a day (BID) | ORAL | 1 refills | Status: DC

## 2016-02-16 MED ORDER — OXYCODONE HCL 5 MG PO TABS: 5.0000 mg | ORAL_TABLET | Freq: Four times a day (QID) | ORAL | 0 refills | Status: DC | PRN

## 2016-02-16 NOTE — Plan of Care (Signed)
Problem: Activity: Goal: Risk for activity intolerance will decrease Outcome: Progressing Pt ambulates

## 2016-02-16 NOTE — Progress Notes (Signed)
Patient prepared for discharge.  Discharge plans, instructions, and summary explained to patient and patient verbalized understanding.  Prescriptions and follow-up appointments were given to the patient.  Patient discharged home via wheelchair with niece.

## 2016-02-16 NOTE — Progress Notes (Signed)
13 Days Post-Op Procedure(s) (LRB): VIDEO ASSISTED THORACOSCOPY (Left) LEFT UPPER LOBECTOMY (Left) Subjective: No complaints this AM  Objective: Vital signs in last 24 hours: Temp:  [97.8 F (36.6 C)-98.9 F (37.2 C)] 98.8 F (37.1 C) (11/02 0500) Pulse Rate:  [91-119] 98 (11/02 0500) Cardiac Rhythm: Normal sinus rhythm (11/02 0719) Resp:  [18-30] 28 (11/02 0500) BP: (111-131)/(65-80) 116/70 (11/02 0500) SpO2:  [93 %-96 %] 93 % (11/02 0500)  Hemodynamic parameters for last 24 hours:    Intake/Output from previous day: 11/01 0701 - 11/02 0700 In: 1330 [P.O.:1320; I.V.:10] Out: 400 [Urine:400] Intake/Output this shift: No intake/output data recorded.  General appearance: alert, cooperative and no distress Neurologic: intact Heart: regular rate and rhythm Lungs: slightly decreased on left Wound: minimal serous drainage from CT site, wound OK  Lab Results: No results for input(s): WBC, HGB, HCT, PLT in the last 72 hours. BMET: No results for input(s): NA, K, CL, CO2, GLUCOSE, BUN, CREATININE, CALCIUM in the last 72 hours.  PT/INR: No results for input(s): LABPROT, INR in the last 72 hours. ABG    Component Value Date/Time   PHART 7.327 (L) 02/04/2016 0600   HCO3 28.2 (H) 02/04/2016 0600   TCO2 27 11/15/2015 0811   ACIDBASEDEF 0.5 02/01/2016 1400   O2SAT 93.6 02/04/2016 0600   CBG (last 3)   Recent Labs  02/15/16 1650 02/15/16 2128 02/16/16 0740  GLUCAP 152* 163* 135*    Assessment/Plan: S/P Procedure(s) (LRB): VIDEO ASSISTED THORACOSCOPY (Left) LEFT UPPER LOBECTOMY (Left) Doing well  Dc home Will set up Oncology eval in [place] as an outpatient   LOS: 13 days    [name] 02/16/2016

## 2016-02-21 ENCOUNTER — Other Ambulatory Visit: Payer: Self-pay | Admitting: *Deleted

## 2016-02-21 NOTE — Patient Outreach (Signed)
Transition of care call. Attempt #1 unsuccessful. I left a message and requested a return call.  [name] [facility] [place] [phone]

## 2016-02-22 ENCOUNTER — Encounter: Payer: Self-pay | Admitting: *Deleted

## 2016-02-22 ENCOUNTER — Other Ambulatory Visit: Payer: Self-pay | Admitting: *Deleted

## 2016-02-22 NOTE — Patient Outreach (Signed)
Transition of care call completed. Pt laryngitis resolved. Able to be independent home alone. Has all meds. Follow up next week. Encouraged pt to call me if any problems and I will be calling weekly over the next month to ensure she is recovering without complications.  Patient was recently discharged from hospital and all medications have been reviewed.   Medication List       Accurate as of 02/22/16  7:23 PM. Always use your most recent med list.          aspirin EC 81 MG tablet Take 81 mg by mouth daily.   diazepam 5 MG tablet Commonly known as:  VALIUM Take 5 mg by mouth 2 (two) times daily. Anxiety   diltiazem 180 MG 24 hr capsule Commonly known as:  DILT-XR Take 1 capsule (180 mg total) by mouth daily.   fish oil-omega-3 fatty acids 1000 MG capsule Take 1 g by mouth daily.   metFORMIN 500 MG tablet Commonly known as:  GLUCOPHAGE Take 500 mg by mouth daily with breakfast.   metoprolol tartrate 25 MG tablet Commonly known as:  LOPRESSOR Take 0.5 tablets (12.5 mg total) by mouth 2 (two) times daily.   naphazoline-glycerin 0.012-0.2 % Soln Commonly known as:  CLEAR EYES Place 1-2 drops into both eyes 4 (four) times daily as needed (for itchy eyes).   NONFORMULARY OR COMPOUNDED ITEM [facility]:  Combo Pain #1 - Baclofen 3%, Gabapentin 8%, Aripiprazole 7.6%, Ketorolac 8%, Licocaine 5%, dispense 240grams, apply 1-2 pumps to focal pain area 3-4 times daily, rub in 1-2 minutes, +3Refills   oxyCODONE 5 MG immediate release tablet Commonly known as:  Oxy IR/ROXICODONE Take 1 tablet (5 mg total) by mouth every 6 (six) hours as needed for severe pain.   pantoprazole 40 MG tablet Commonly known as:  PROTONIX Take 40 mg by mouth 2 (two) times daily.   pravastatin 40 MG tablet Commonly known as:  PRAVACHOL Take 40 mg by mouth daily.      Depression screen PHQ 2/9 02/22/2016  Decreased Interest 0  Down, Depressed, Hopeless 0  PHQ - 2 Score 0   Fall  Risk  02/22/2016 01/02/2016 11/22/2015 10/28/2015  Falls in the past year? No No No No   THN CM Care Plan Problem One   Flowsheet Row Most Recent Value  Role Documenting the Problem One  Care Management Coordinator  Care Plan for Problem One  Active  THN Long Term Goal (31-90 days)  Pt to follow post surgical instructions over the next 90 days to avoid complication and readmission.  THN Long Term Goal Start Date  02/22/16  Interventions for Problem One Long Term Goal  Reviewed discharge orders, meds, spirometry, coughing.  THN CM Short Term Goal #1 (0-30 days)  Pt will attend follow up post surgery appt on 11/9 and 11/14.  THN CM Short Term Goal #1 Start Date  02/22/16  Interventions for Short Term Goal #1  Reviewed pt appt schedule and made sure she has transportatioin.  THN CM Short Term Goal #2 (0-30 days)  Pt will call NP if any problems arise for interventiuon and to avoid complication over the next 30 days.  THN CM Short Term Goal #2 Start Date  02/22/16  Interventions for Short Term Goal #2  Gave pt my phone number, 24 hour nurse line and encouraged her NOT to hesitate to call.     [name] [facility] [place] [phone]

## 2016-02-23 ENCOUNTER — Ambulatory Visit (INDEPENDENT_AMBULATORY_CARE_PROVIDER_SITE_OTHER): Payer: Self-pay | Admitting: *Deleted

## 2016-02-23 DIAGNOSIS — C3412 Malignant neoplasm of upper lobe, left bronchus or lung: Secondary | ICD-10-CM

## 2016-02-23 DIAGNOSIS — Z902 Acquired absence of lung [part of]: Secondary | ICD-10-CM

## 2016-02-23 DIAGNOSIS — Z4802 Encounter for removal of sutures: Secondary | ICD-10-CM

## 2016-02-24 NOTE — Progress Notes (Signed)
Ms. Hoey returns s/p LEFT UPPER LOBECTOMY for suture removal of the two previous chest tube sites. This was easily done. Theses sites as well as the mini thoracotomy incision are all very well healed. Diet and bowels are good. She will return as scheduled with a CXR.

## 2016-02-27 ENCOUNTER — Encounter: Payer: Self-pay | Admitting: *Deleted

## 2016-02-27 ENCOUNTER — Other Ambulatory Visit: Payer: Self-pay | Admitting: *Deleted

## 2016-02-27 ENCOUNTER — Other Ambulatory Visit: Payer: Self-pay | Admitting: Thoracic Surgery (Cardiothoracic Vascular Surgery)

## 2016-02-27 DIAGNOSIS — C3490 Malignant neoplasm of unspecified part of unspecified bronchus or lung: Secondary | ICD-10-CM

## 2016-02-27 NOTE — Patient Outreach (Signed)
Transition of care call attempted, pt did not answer, however, I left a message and requested a return call.  2nd call today. Caitlin Oliver was able to speak with me and we were able to complete her intial assessment. She has had her stitches removed. She will see her pulmonologist on Wednesday. I am concerned that she has COPD or Asthma and does not have any respiratory meds. She is coughing up a lot of mucous. She has some severe surgical pain and is out of her oxycodone. We discussed Advanced Directives today which she does not have.  PLAN: Will send Advanced Directives and MOST form.            Advised pt to request pain medication from surgeon tomorrow.            I will send today's note to her primary MD for consideration.            I have advised her she can take plain MUCINEX to help her expectorate and drink plenty of fluids.  [name] [facility] [place] [phone]

## 2016-02-28 ENCOUNTER — Other Ambulatory Visit: Payer: Self-pay | Admitting: *Deleted

## 2016-02-28 ENCOUNTER — Encounter: Payer: Self-pay | Admitting: Thoracic Surgery (Cardiothoracic Vascular Surgery)

## 2016-02-28 ENCOUNTER — Ambulatory Visit
Admission: RE | Admit: 2016-02-28 | Discharge: 2016-02-28 | Disposition: A | Discharge status: Home / Self Care | Payer: PPO | Source: Ambulatory Visit | Attending: Thoracic Surgery (Cardiothoracic Vascular Surgery) | Admitting: Thoracic Surgery (Cardiothoracic Vascular Surgery)

## 2016-02-28 ENCOUNTER — Ambulatory Visit (INDEPENDENT_AMBULATORY_CARE_PROVIDER_SITE_OTHER): Payer: Self-pay | Admitting: Thoracic Surgery (Cardiothoracic Vascular Surgery)

## 2016-02-28 VITALS — BP 98/72 | HR 82 | Resp 20 | Ht 64.0 in | Wt 175.6 lb

## 2016-02-28 DIAGNOSIS — Z902 Acquired absence of lung [part of]: Secondary | ICD-10-CM

## 2016-02-28 DIAGNOSIS — C3490 Malignant neoplasm of unspecified part of unspecified bronchus or lung: Secondary | ICD-10-CM

## 2016-02-28 DIAGNOSIS — C3412 Malignant neoplasm of upper lobe, left bronchus or lung: Secondary | ICD-10-CM

## 2016-02-28 DIAGNOSIS — J90 Pleural effusion, not elsewhere classified: Secondary | ICD-10-CM | POA: Diagnosis not present

## 2016-02-28 DIAGNOSIS — G8918 Other acute postprocedural pain: Secondary | ICD-10-CM

## 2016-02-28 MED ORDER — OXYCODONE HCL 5 MG PO TABS: 5.0000 mg | ORAL_TABLET | Freq: Four times a day (QID) | ORAL | 0 refills | Status: DC | PRN

## 2016-02-28 NOTE — Progress Notes (Signed)
[address]       [place] [address]             [phone]      HPI: [name] returns for a scheduled postoperative follow-up visit.  She is a 75 year old woman with a history of tobacco abuse who had a thoracoscopic left upper lobectomy on 02/03/2016 for a stage IA squamous cell carcinoma. Her postop course was complicated by prolonged air leak. That finally resolved and she went home on day 13.  Today she says that she continues to have a lot of pain from the incision. She is using oxycodone about every 6 hours. She is not using any Tylenol or ibuprofen. She says that she was up all night with a cough, but did not take any cough medication for that. She is not having any shortness of breath. She denies swelling in her legs.  Past Medical History:  Diagnosis Date  . Anxiety   . Carotid artery disease ([place])   . Claudication ([place])   . COPD (chronic obstructive pulmonary disease) ([place])   . Diabetes mellitus   . Dizzy   . GERD (gastroesophageal reflux disease)   . Headache   . History of kidney stones   . Hypercholesteremia   . Hypertension   . 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 Shortness of breath    with exertion  . Stomach ulcer   . Tobacco abuse     Current Outpatient Prescriptions  Medication Sig Dispense Refill  . aspirin EC 81 MG tablet Take 81 mg by mouth daily.    . diazepam (VALIUM) 5 MG tablet Take 5 mg by mouth 2 (two) times daily. Anxiety    . diltiazem (DILT-XR) 180 MG 24 hr capsule Take 1 capsule (180 mg total) by mouth daily. 30 capsule 1  . fish oil-omega-3 fatty acids 1000 MG capsule Take 1 g by mouth daily.     . metFORMIN (GLUCOPHAGE) 500 MG tablet Take 500 mg by mouth daily with breakfast.     . metoprolol tartrate (LOPRESSOR) 25 MG tablet Take 0.5 tablets (12.5 mg total) by mouth 2 (two) times daily. 30 tablet 1  . naphazoline-glycerin (CLEAR EYES) 0.012-0.2 % SOLN Place 1-2  drops into both eyes 4 (four) times daily as needed (for itchy eyes).    . NONFORMULARY OR COMPOUNDED ITEM [facility] pharmacy:  Combo Pain #1 - Baclofen 3%, Gabapentin 8%, Aripiprazole 7.9%, Ketorolac 8%, Licocaine 5%, dispense 240grams, apply 1-2 pumps to focal pain area 3-4 times daily, rub in 1-2 minutes, +3Refills (Patient taking differently: Apply 1 application topically 3 (three) times daily as needed (for foot pain.). [facility]:  Combo Pain #1 - Baclofen 3%, Gabapentin 8%, Aripiprazole 0.2%, Ketorolac 8%, Licocaine 5%, dispense 240grams, apply 1-2 pumps to focal pain area 3-4 times daily, rub in 1-2 minutes, +3Refills) 240 each 3  . pantoprazole (PROTONIX) 40 MG tablet Take 40 mg by mouth 2 (two) times daily.     . pravastatin (PRAVACHOL) 40 MG tablet Take 40 mg by mouth daily.    [name] oxyCODONE (OXY IR/ROXICODONE) 5 MG immediate release tablet Take 1 tablet (5 mg total) by mouth every 6 (six) hours as needed for severe pain. 30 tablet 0   No current facility-administered medications for this visit.     Physical Exam BP 98/72 (BP Location: Right Arm, Patient Position: Sitting, Cuff Size: Large)   Pulse  82   Resp 20   Ht _0  (1.626 m)   Wt 175 lb 9.6 oz (79.7 kg)   SpO2 96% Comment: RA  BMI 30.79 kg/m   75 year old woman in no acute distress Alert and oriented 3 Lungs diminished breath sounds at left base, otherwise clear. No wheezing. Cardiac regular rate and rhythm Extremities no edema   Diagnostic Tests: CHEST  2 VIEW  COMPARISON:  PA and lateral chest x-ray of February 16, 2016  FINDINGS: There has been further interval improvement in the appearance of the left infrahilar region. There remains volume loss on the left with tenting of the hemidiaphragm. A trace of pleural fluid layers posteriorly and laterally on the left. The left apical pneumothorax has resolved. The right lung is clear. The heart is normal in size. There is calcification in  the wall of the aortic arch.  IMPRESSION: No residual left-sided pneumothorax. Interval improvement in the appearance of the left infrahilar region. Persistent small left pleural effusion.  Thoracic aortic atherosclerosis.   Electronically Signed   By: David  Martinique M.D.   On: 02/28/2016 10:40 I personally reviewed the chest x-ray and concur with the findings noted above  Impression: [name] is a 75 year old woman who is about 3 weeks out from a thoracoscopic left upper lobectomy for stage IA non-small cell carcinoma. This was a squamous cell tumor. She had a prolonged air leak postoperatively but it resolved on its own and her tube was removed.  She is still having significant incisional pain. I gave her a prescription for an additional 30 oxycodone tablets 5 mg, 1 by mouth every 6 hours as needed. No refills. I encouraged her to try taking Tylenol or ibuprofen in addition to the oxycodone and hopefully then can wean off the narcotic over the next several weeks.  She has a cough. Her chest x-ray is clear and her lungs sound clear on exam. She may use over-the-counter cough medication as needed for that. There is no indication of bronchitis or pneumonia.  She will need to be seen in the multidisciplinary thoracic oncology clinic. She should not need any adjuvant therapy at this point. We will arrange for an appointment.  Plan: Referral to [place]  I will plan to see her back in 2 months with a PA and lateral chest x-ray.  I'm certainly happy to see her any time before that if needed.  [name], MD [facility] [phone]

## 2016-02-29 ENCOUNTER — Telehealth: Payer: Self-pay | Admitting: *Deleted

## 2016-02-29 ENCOUNTER — Ambulatory Visit: Payer: PPO | Admitting: Neurology

## 2016-02-29 DIAGNOSIS — E7800 Pure hypercholesterolemia, unspecified: Secondary | ICD-10-CM | POA: Diagnosis not present

## 2016-02-29 DIAGNOSIS — R911 Solitary pulmonary nodule: Secondary | ICD-10-CM

## 2016-02-29 DIAGNOSIS — E119 Type 2 diabetes mellitus without complications: Secondary | ICD-10-CM | POA: Diagnosis not present

## 2016-02-29 DIAGNOSIS — I10 Essential (primary) hypertension: Secondary | ICD-10-CM | POA: Diagnosis not present

## 2016-02-29 DIAGNOSIS — R0781 Pleurodynia: Secondary | ICD-10-CM | POA: Diagnosis not present

## 2016-02-29 DIAGNOSIS — C3492 Malignant neoplasm of unspecified part of left bronchus or lung: Secondary | ICD-10-CM | POA: Diagnosis not present

## 2016-02-29 DIAGNOSIS — F419 Anxiety disorder, unspecified: Secondary | ICD-10-CM | POA: Diagnosis not present

## 2016-02-29 NOTE — Telephone Encounter (Signed)
Oncology Nurse Navigator Documentation  Oncology Nurse Navigator Flowsheets 02/29/2016  Navigator Location [facility]  Referral date to RadOnc/MedOnc 02/28/2016  Navigator Encounter Type Introductory phone call;Telephone/I received referral from Dr. Leonarda Salon office.  I called patient and gave her the appt for [place] on 11/30 /17.  Patient verbalized understanding of appt time and place.   Telephone Outgoing Call  Barriers/Navigation Needs Coordination of Care  Interventions Coordination of Care  Coordination of Care Appts  Acuity Level 1  Acuity Level 1 Initial guidance, education and coordination as needed  Time Spent with Patient 15

## 2016-03-02 ENCOUNTER — Telehealth: Payer: Self-pay | Admitting: Neurology

## 2016-03-02 NOTE — Telephone Encounter (Signed)
Spoke to pt and she is to speak to her pcp about her pulled muscle on L chest area due to cough.  She did put call in and is awaiting there call back. If worsens she may also seek care at urgent care or ED. She verbalized understanding.

## 2016-03-02 NOTE — Telephone Encounter (Signed)
Pt called in about rx tizandine hcl 71m. She was given a 1 time rx for pulled muscle on the back of the neck. She is complaining now of a pulled muscle below/under her left breast. She wants to know about increasing the frequency of the medication. Pt states the rx was written by Dr. WAletha Halimon 01/12/16. She says she has not been using them a whole lot and that is why she has some left over. I have suggested the pt call her pcp whom she saw yesterday about it. Pt expressed understanding. Please call and advise

## 2016-03-05 ENCOUNTER — Other Ambulatory Visit: Payer: Self-pay | Admitting: *Deleted

## 2016-03-05 NOTE — Patient Outreach (Addendum)
Transition of care call. Pt is showing some signs of improvement. She is using mucinex and that is helping her with her cough and getting some expectoration of mucous. She did see her surgeon and did get an additional Rx for her pain medication for the surgical pain which has been significant (lobectomy). She reports she is drinking plenty of water too.  I advised her to try to be as active as possible without taxing herself too much. Maintain her bowel function. Drink plenty of fluids. Call if any problems occur for early intervention.  I will call her again in a week.  [name] [facility] [place] [phone]

## 2016-03-12 ENCOUNTER — Telehealth: Payer: Self-pay | Admitting: *Deleted

## 2016-03-12 ENCOUNTER — Other Ambulatory Visit: Payer: Self-pay | Admitting: *Deleted

## 2016-03-12 DIAGNOSIS — H5203 Hypermetropia, bilateral: Secondary | ICD-10-CM | POA: Diagnosis not present

## 2016-03-12 NOTE — Patient Outreach (Signed)
Transition of care call. No anwer today. I left a message and requested a return call. I will call her again tomorrow, if I do not hear from her. [name] GNP-BC [facility] Care Manager [phone]  Pt called me back in the pm and left a message she wanted to talk to me. She stated she is having some abdominal pain. She said she would call the nurse line.  I was able to reach the pt at approximately 5:00 pm. She said she had talked to the nurse line and they had suggested she go to the ED. I listened to her complaints and found that she has abdominal pain that comes and goes. She is moving her bowel routinely despite being on narcotic pain medication. Her stool are normal in color and she has not seen any bright red blood. She does admit to a hx of GI bleed and says no sxs like that are present. She is taking pantoprazole 40 mg bid. I advised her I did not think she needs to go to the ED at this time. If her symptoms worsen she can reconsider. I will call on her tomorrow and check up on her.  [name] [facility] [place] [phone]

## 2016-03-12 NOTE — Telephone Encounter (Signed)
Mailed [place] letter to pt.

## 2016-03-13 ENCOUNTER — Other Ambulatory Visit: Payer: Self-pay | Admitting: *Deleted

## 2016-03-13 ENCOUNTER — Telehealth: Payer: Self-pay | Admitting: *Deleted

## 2016-03-13 DIAGNOSIS — G8918 Other acute postprocedural pain: Secondary | ICD-10-CM

## 2016-03-13 MED ORDER — OXYCODONE HCL 5 MG PO TABS: 5.0000 mg | ORAL_TABLET | Freq: Four times a day (QID) | ORAL | 0 refills | Status: DC | PRN

## 2016-03-13 NOTE — Telephone Encounter (Signed)
Caitlin Oliver has called to request a refill for Oxycodone, last filled at her appointment with Dr. Rande Brunt on 02/28/16. I consulted him and a refill was approved. She was notified that a new script would be available today at our office.  She said she would be here on Thursday when she had an appointment at the [facility].

## 2016-03-13 NOTE — Telephone Encounter (Signed)
Oncology Nurse Navigator Documentation  Oncology Nurse Navigator Flowsheets 03/13/2016  Navigator Location [facility]  Navigator Encounter Type Telephone/Ms. Elberta Fortis called.  She asked that I call her regarding appt for Thursday.  She stated she forgot.  I called to update her.  She was thankful for the call back and will find transportation to appt. I asked that she call if she could not find transportation.   Telephone Outgoing Call  [employer] Needs Education  Education Other  Interventions Education  Education Method Verbal  Acuity Level 1  Acuity Level 1 Minimal follow up required  Time Spent with Patient 15

## 2016-03-13 NOTE — Patient Outreach (Signed)
Follow up abdominal pain. Pt reports she still has a pain below her breast bone. She says she has had it ever since she had the lobectomy. She also has a hx of GERD and has been taking pantoprozole 40 mg bid. She called her MD office early this am and left a message with the after hours provider and they suggested that she call back during office hours but she has not done so. It is now 12:40 pm. I have also encouraged her to call her MD and reminded her they may be closed for lunch. I also suggested she take some TUMs if she has any of these to see if this  Helps at all.. I will call her again tomorrow to check in with her.  [name] [facility] [place] [phone]

## 2016-03-15 ENCOUNTER — Encounter: Payer: Self-pay | Admitting: *Deleted

## 2016-03-15 ENCOUNTER — Ambulatory Visit: Payer: PPO | Attending: Internal Medicine | Admitting: Physical Therapy

## 2016-03-15 ENCOUNTER — Telehealth: Payer: Self-pay | Admitting: Internal Medicine

## 2016-03-15 ENCOUNTER — Other Ambulatory Visit: Payer: Self-pay | Admitting: *Deleted

## 2016-03-15 ENCOUNTER — Other Ambulatory Visit (HOSPITAL_BASED_OUTPATIENT_CLINIC_OR_DEPARTMENT_OTHER): Payer: PPO

## 2016-03-15 ENCOUNTER — Encounter: Payer: Self-pay | Admitting: Internal Medicine

## 2016-03-15 ENCOUNTER — Ambulatory Visit (HOSPITAL_BASED_OUTPATIENT_CLINIC_OR_DEPARTMENT_OTHER): Payer: PPO | Admitting: Internal Medicine

## 2016-03-15 VITALS — BP 128/74 | HR 78 | Temp 98.4°F | Resp 17 | Ht 64.0 in | Wt 177.8 lb

## 2016-03-15 DIAGNOSIS — R29898 Other symptoms and signs involving the musculoskeletal system: Secondary | ICD-10-CM

## 2016-03-15 DIAGNOSIS — R911 Solitary pulmonary nodule: Secondary | ICD-10-CM

## 2016-03-15 DIAGNOSIS — C3412 Malignant neoplasm of upper lobe, left bronchus or lung: Secondary | ICD-10-CM | POA: Diagnosis not present

## 2016-03-15 DIAGNOSIS — Z483 Aftercare following surgery for neoplasm: Secondary | ICD-10-CM

## 2016-03-15 LAB — CBC WITH DIFFERENTIAL/PLATELET
BASO%: 0.4 % (ref 0.0–2.0)
BASOS ABS: 0 10*3/uL (ref 0.0–0.1)
EOS%: 2.1 % (ref 0.0–7.0)
Eosinophils Absolute: 0.2 10*3/uL (ref 0.0–0.5)
HEMATOCRIT: 43.3 % (ref 34.8–46.6)
HGB: 13.8 g/dL (ref 11.6–15.9)
LYMPH#: 2.8 10*3/uL (ref 0.9–3.3)
LYMPH%: 31 % (ref 14.0–49.7)
MCH: 27.8 pg (ref 25.1–34.0)
MCHC: 31.9 g/dL (ref 31.5–36.0)
MCV: 87.3 fL (ref 79.5–101.0)
MONO#: 0.9 10*3/uL (ref 0.1–0.9)
MONO%: 9.9 % (ref 0.0–14.0)
NEUT#: 5.1 10*3/uL (ref 1.5–6.5)
NEUT%: 56.6 % (ref 38.4–76.8)
PLATELETS: 347 10*3/uL (ref 145–400)
RBC: 4.96 10*6/uL (ref 3.70–5.45)
RDW: 13.1 % (ref 11.2–14.5)
WBC: 9.1 10*3/uL (ref 3.9–10.3)

## 2016-03-15 LAB — COMPREHENSIVE METABOLIC PANEL
ALT: 18 U/L (ref 0–55)
ANION GAP: 8 meq/L (ref 3–11)
AST: 21 U/L (ref 5–34)
Albumin: 3.6 g/dL (ref 3.5–5.0)
Alkaline Phosphatase: 105 U/L (ref 40–150)
BUN: 15.9 mg/dL (ref 7.0–26.0)
CALCIUM: 10 mg/dL (ref 8.4–10.4)
CHLORIDE: 99 meq/L (ref 98–109)
CO2: 29 mEq/L (ref 22–29)
Creatinine: 0.8 mg/dL (ref 0.6–1.1)
EGFR: 70 mL/min/{1.73_m2} — ABNORMAL LOW (ref >90)
Glucose: 106 mg/dl (ref 70–140)
POTASSIUM: 5.3 meq/L — AB (ref 3.5–5.1)
Sodium: 136 mEq/L (ref 136–145)
Total Bilirubin: 0.29 mg/dL (ref 0.20–1.20)
Total Protein: 7.4 g/dL (ref 6.4–8.3)

## 2016-03-15 NOTE — Patient Outreach (Signed)
Called pt to follow up on her epigastric pain. I talked with Mr. Caitlin Oliver her significant other and he advised she is at MD appts all day today. I advised him that I have been calling to check up on her each week and was concerned and glad that she is seeing her doctors today. He states he will advise her I called. I will try to reach her again tomorrow.  [name] [facility] [place] [phone]

## 2016-03-15 NOTE — Progress Notes (Signed)
Oncology Nurse Navigator Documentation  Oncology Nurse Navigator Flowsheets 03/15/2016  Navigator Location [facility]  Navigator Encounter Type Clinic/MDC/spoke with Caitlin Oliver today at thoracic clinic.  Gave and explained information on lung cancer.  I encouraged her to do PT  Abnormal Finding Date 11/07/2015  Confirmed Diagnosis Date 02/03/2016  Surgery Date 02/03/2016  Multidisiplinary Clinic Date 03/15/2016  Treatment Initiated Date 02/03/2016  Patient Visit Type MedOnc  Treatment Phase Other  Barriers/Navigation Needs Education  Education Newly Diagnosed Cancer Education  Interventions Education  Education Method Verbal;Written  Acuity Level 2  Acuity Level 2 Educational needs  Time Spent with Patient 30

## 2016-03-15 NOTE — Progress Notes (Signed)
[place] Clinical Social Work  Clinical Social Work met with patientat [place] appointment to offer support and assess for psychosocial needs.  Patient shared she is recovering from surgery and has no plans for treatment after surgery.  The patient reported no concerns or signs of distress.  Patient lives in [place] and enjoys watching television or spending time with family.  Clinical Social Work briefly discussed Clinical Social Work role and [employer] support programs/services.  Clinical Social Work encouraged patient to call with any additional questions or concerns.   Polo Riley, MSW, LCSW, OSW-C Clinical Social Worker [facility] [phone]

## 2016-03-15 NOTE — Therapy (Signed)
[place], [place], [address] Phone: [phone]   Fax:  [phone]  Physical Therapy Evaluation  Patient Details  Name: Caitlin Oliver MRN: [id number] Date of Birth: 10-Feb-1941 Referring Provider: Dr. Curt Bears  Encounter Date: 03/15/2016      PT End of Session - 03/15/16 1450    Visit Number 1   Number of Visits 9   Date for PT Re-Evaluation 04/30/16   PT Start Time 1335   PT Stop Time 1402   PT Time Calculation (min) 27 min   Activity Tolerance Patient tolerated treatment well   Behavior During Therapy [facility] for tasks assessed/performed      Past Medical History:  Diagnosis Date  . Anxiety   . Carotid artery disease ([place])   . Claudication ([place])   . COPD (chronic obstructive pulmonary disease) ([place])   . Diabetes mellitus   . Dizzy   . GERD (gastroesophageal reflux disease)   . Headache   . History of kidney stones   . Hypercholesteremia   . Hypertension   . 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 Shortness of breath    with exertion  . Stomach ulcer   . Tobacco abuse     Past Surgical History:  Procedure Laterality Date  . ABDOMINAL HYSTERECTOMY    . APPENDECTOMY    . BREAST REDUCTION SURGERY    . CAROTID ANGIOGRAM N/A 02/25/2013   Procedure: CAROTID ANGIOGRAM;  Surgeon: Lorretta Harp, MD;  Location: [facility] CATH LAB;  Service: Cardiovascular;  Laterality: N/A;  . ENDARTERECTOMY Right 03/06/2013   Procedure: ENDARTERECTOMY CAROTID-RIGHT;  Surgeon: Serafina Mitchell, MD;  Location: [place];  Service: Vascular;  Laterality: Right;  . ENDARTERECTOMY Left 05/07/2013   Procedure: LEFT CAROTID ARTERY ENDARTERECTOMY WITH VASCU-GUARD PATCH ANGIOPLASTY ;  Surgeon: Serafina Mitchell, MD;  Location: [place];  Service: Vascular;  Laterality: Left;  . LOBECTOMY Left 02/03/2016   Procedure: LEFT UPPER LOBECTOMY;  Surgeon: [name], MD;   Location: [place];  Service: Thoracic;  Laterality: Left;  . LOWER EXTREMITY ANGIOGRAM N/A 02/25/2013   Procedure: LOWER EXTREMITY ANGIOGRAM;  Surgeon: Lorretta Harp, MD;  Location: [facility] CATH LAB;  Service: Cardiovascular;  Laterality: N/A;  . LOWER EXTREMITY ANGIOGRAM N/A 07/27/2013   Procedure: LOWER EXTREMITY ANGIOGRAM;  Surgeon: Lorretta Harp, MD;  Location: [facility] CATH LAB;  Service: Cardiovascular;  Laterality: N/A;  . PATCH ANGIOPLASTY Right 03/06/2013   Procedure: PATCH ANGIOPLASTY of Right Carotid Artery using Vascu-Guard Patch;  Surgeon: Serafina Mitchell, MD;  Location: [place];  Service: Vascular;  Laterality: Right;  . PERIPHERAL VASCULAR CATHETERIZATION N/A 11/15/2015   Procedure: Aortic Arch Angiography;  Surgeon: Serafina Mitchell, MD;  Location: [place] CV LAB;  Service: Cardiovascular;  Laterality: N/A;  . PERIPHERAL VASCULAR CATHETERIZATION Bilateral 11/15/2015   Procedure: Carotid Angiography;  Surgeon: Serafina Mitchell, MD;  Location: [place] CV LAB;  Service: Cardiovascular;  Laterality: Bilateral;  . PERIPHERAL VASCULAR CATHETERIZATION Left 11/15/2015   Procedure: Upper Extremity Angiography;  Surgeon: Serafina Mitchell, MD;  Location: [place] CV LAB;  Service: Cardiovascular;  Laterality: Left;  . PERIPHERAL VASCULAR CATHETERIZATION Left 11/15/2015   Procedure: Peripheral Vascular Intervention;  Surgeon: Serafina Mitchell, MD;  Location: [place] CV LAB;  Service: Cardiovascular;  Laterality: Left;  subclavian   . SALIVARY GLAND SURGERY     scar tissue removed from left saliva glad  .  TUBAL LIGATION    . VIDEO ASSISTED THORACOSCOPY (VATS)/WEDGE RESECTION Left 02/03/2016   Procedure: VIDEO ASSISTED THORACOSCOPY;  Surgeon: [name], MD;  Location: [place];  Service: Thoracic;  Laterality: Left;    There were no vitals filed for this visit.       Subjective Assessment - 03/15/16 1407    Subjective Has had pain ever since her surgery 02/03/16.   Pertinent History  Pt. presented with dizziness and nausea; work up resulted in incidental finding of a lung nodule.  Pt. had left upper lobe wedge resection 02/03/16 by VATS for diagnosis of stage Ia squamous cell carcinoma.  She will be followed without further cancer treatment at the moment.  Pt. quit smoking in 2015 after 84 pack-years.  DM, HTN.   Patient Stated Goals get info from all lung clinic providers   Currently in Pain? Yes   Pain Score 7    Pain Location Flank   Pain Orientation Left   Pain Descriptors / Indicators Sharp;Dull   Pain Type Surgical pain   Pain Onset More than a month ago   Pain Frequency Constant   Aggravating Factors  movement, heating pad   Pain Relieving Factors sitting still, using pillow for support along left flank, rest, ice            [facility] PT Assessment - 03/15/16 0001      Assessment   Medical Diagnosis left upper lobe squamous cell carcinoma, s/p VATS lobectomy, stage I   Referring Provider Dr. Curt Bears   Onset Date/Surgical Date 02/03/16   Prior Therapy none     Precautions   Precautions Other (comment)   Precaution Comments cancer precautions     Restrictions   Weight Bearing Restrictions No     Balance Screen   Has the patient fallen in the past 6 months No   Has the patient had a decrease in activity level because of a fear of falling?  No   Is the patient reluctant to leave their home because of a fear of falling?  No     Home Environment   Living Environment Private residence   Living Arrangements Alone   Type of [place] One level     Prior Function   Level of [place] Independent   Leisure was walking for 10 minutes most days before onset of current problems     Cognition   Overall Cognitive Status Within Functional Limits for tasks assessed     Observation/Other Assessments   Observations well looking woman sitting in chair keeping pink thoracic surgery pillow tucked in at her left side     Functional Tests    Functional tests Sit to Stand     Sit to Stand   Comments 14 times in 30 seconds, good for her age     Posture/Postural Control   Posture/Postural Control Postural limitations   Postural Limitations Rounded Shoulders;Forward head     ROM / Strength   AROM / PROM / Strength AROM     AROM   Overall AROM Comments standing trunk AROM WFL but limited slightly in right sidebend by pain in left flank when she stretches that side     Ambulation/Gait   Ambulation/Gait Yes   Ambulation/Gait Assistance 7: Independent     Balance   Balance Assessed Yes     Dynamic Standing Balance   Dynamic Standing - Comments pt. unable to reach forward any significant amount because of left  side pain                           PT Education - 03/15/16 1450    Education provided Yes   Education Details walking, posture, breathing, Cure article, PT info   Person(s) Educated Patient   Methods Explanation;Handout   Comprehension Verbalized understanding               Lung Clinic Goals - 03/15/16 1458      Patient will be able to verbalize understanding of the benefit of exercise to decrease fatigue.   Status Achieved     Patient will be able to verbalize the importance of posture.   Status Achieved     Patient will be able to demonstrate diaphragmatic breathing for improved lung function.   Status Achieved     Patient will be able to verbalize understanding of the role of physical therapy to prevent functional decline and who to contact if physical therapy is needed.   Status Achieved             Plan - 03/15/16 1451    Clinical Impression Statement Very pleasant woman diagnosed with Stage I lung cancer and s/p VATS lobectomy 02/03/16.  She continues to have significant surgical pain along her left flank since that time, still clutching a pillow to her side to help with that.  Pain is made worse with stretching that area such as by bending right in standing.  She  would likely benefit from some therapy for her pain, but she wasn't sure today that she would want to try therapy.  She seemed fearful that therapists would make her move and that that would increase her pain.  Eval is moderate complexity:  condition is evolving as she recovers from surgery and she has comorbidities including diabetes, HTN, COPD.   Rehab Potential Good   PT Frequency 2x / week   PT Duration 4 weeks   PT Treatment/Interventions ADLs/Self Care Home Management;Electrical Stimulation;Moist Heat;Cryotherapy;Therapeutic exercise;Patient/family education;Manual techniques;Scar mobilization;Passive range of motion;Taping   PT Next Visit Plan If she will come to therapy, further assess which motions exacerbate pain.  Try gentle soft tissue work/soft tissue mobilization/myofascial release to the area.  Progress to self-stretching.  Consider cold pack and electrical stim.   PT Home Exercise Plan walking, breathing exercise   Recommended Other Services recommend follow up with our [facility] clinic (closer to her home) if she doesn't want to come to [place] and Agree with Plan of Care Patient      Patient will benefit from skilled therapeutic intervention in order to improve the following deficits and impairments:  Pain, Decreased range of motion  Visit Diagnosis: Aftercare following surgery for neoplasm - Plan: PT plan of care cert/re-cert  Other symptoms and signs involving the musculoskeletal system - Plan: PT plan of care cert/re-cert      G-Codes - 25/42/70 1458    Functional Assessment Tool Used clinical judgement   Functional Limitation Changing and maintaining body position   Changing and Maintaining Body Position Current Status (W2376) At least 60 percent but less than 80 percent impaired, limited or restricted   Changing and Maintaining Body Position Goal Status (E8315) At least 1 percent but less than 20 percent impaired, limited or restricted       Problem  List Patient Active Problem List   Diagnosis Date Noted  . Cancer of left lung ([place]) 02/03/2016  .  COPD GOLD II 12/08/2015  . Solitary pulmonary nodule 12/08/2015  . Bilateral occipital neuralgia 11/22/2015  . Double vision 10/28/2015  . TIA (transient ischemic attack) 10/28/2015  . Subclavian artery stenosis, left ([place]) 10/28/2015  . Subclavian steal syndrome 10/28/2015  . Acute blood loss anemia 10/20/2013  . C. difficile colitis 10/19/2013  . GI bleed 10/18/2013  . COPD 10/18/2013  . Bleeding gastrointestinal 10/18/2013  . Hypoxia 10/10/2013  . Bronchitis 10/08/2013  . PVD (peripheral vascular disease) with claudication ([place]) 07/29/2013  . Carotid stenosis 05/07/2013  . Carotid artery narrowing 05/07/2013  . Occlusion and stenosis of carotid artery without mention of cerebral infarction 03/02/2013  . Carotid artery obstruction 03/02/2013  . Carotid artery disease ([place]) 02/27/2013  . Tobacco abuse 02/27/2013  . Compulsive tobacco user syndrome 02/27/2013  . Claudication ([place]) 01/20/2013  . Essential hypertension 01/20/2013  . Type 2 diabetes mellitus ([place]) 01/20/2013  . Hyperlipidemia 01/20/2013    SALISBURY,DONNA 03/15/2016, 3:03 PM  [place], [place], [address] Phone: [phone]   Fax:  [phone]  Name: Caitlin Oliver MRN: [id number] Date of Birth: 04/08/1941  Serafina Royals, PT 03/15/16 3:03 PM

## 2016-03-15 NOTE — Telephone Encounter (Signed)
Appointments scheduled per 11/30 LOS. Patient given AVS report and calendars with future scheduled appointments.

## 2016-03-15 NOTE — Progress Notes (Signed)
[place] Telephone:([phone]) [phone]   Fax:(336) [phone] Multidisciplinary thoracic oncology clinic  CONSULT NOTE  REFERRING PHYSICIAN: Dr. Modesto Charon  REASON FOR CONSULTATION:  75 years old white female recently diagnosed with lung cancer.  HPI Caitlin Oliver is a 75 y.o. female with past medical history significant for hypertension, diabetes mellitus, dyslipidemia, subclavian steal syndrome status post stent placement, peripheral vascular disease, carotid artery disease and also history of GI bleed and long history of smoking but quit 3 years ago. In early July 2017 the patient was complaining of headache and dizziness. She had a CT angiogram of the head and neck performed on 10/20/2015 and it showed 1.4 cm lung nodule in the left upper lobe worrisome for malignancy. This was followed by CT scan of the chest on 11/07/2015 and it showed 1.2 cm left upper lobe pulmonary nodule suspicious for malignancy. The patient was referred to Dr. Melvyn Novas and a PET scan was performed on 12/15/2015 and it showed 1.5 cm left upper lobe pulmonary nodule increased in size since 11/04/2015. The nodule was hypermetabolic with SUV max of 16.5. The PET scan showed also to hypermetabolic left cervical level IB submandibular lymph nodes the largest measured 1.1 cm. Ultrasound-guided lymph node biopsy was performed on 01/02/2016 and the final pathology showed limited lymph nodal tissue. The patient was referred to Dr. Roxan Hockey and on 02/03/2016 she underwent left VATS with left upper lobectomy and mediastinal lymph node sampling. The final pathology ([id number]) showed squamous cell carcinoma measuring 2.0 cm with negative resection margin and negative lymph node sampling.  The patient is recovering well from her surgery but continues to have soreness on the left side of the chest and she take oxycodone every 6 hours as needed for pain. She also has shortness breath at baseline and increased with  exertion and mild dry cough. She denied having any significant hemoptysis. She lost around 13 pounds in the last 2 months. She has intermittent headache but no visual changes. She denied having any significant fever or chills, no nausea, vomiting, diarrhea or constipation.  Family history significant for mother with stroke and hypertension and father with unknown cause of death. The patient is a widow and she had one son who died 3 years ago in a car accident. She is a retired Automotive engineer. She has a history of smoking 1.5 pack per day for around 56 years and quit 3 years ago. She has no history of alcohol or drug abuse.   HPI  Past Medical History:  Diagnosis Date  . Anxiety   . Carotid artery disease ([place])   . Claudication ([place])   . COPD (chronic obstructive pulmonary disease) ([place])   . Diabetes mellitus   . Dizzy   . GERD (gastroesophageal reflux disease)   . Headache   . History of kidney stones   . Hypercholesteremia   . Hypertension   . 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 Shortness of breath    with exertion  . Stomach ulcer   . Tobacco abuse     Past Surgical History:  Procedure Laterality Date  . ABDOMINAL HYSTERECTOMY    . APPENDECTOMY    . BREAST REDUCTION SURGERY    . CAROTID ANGIOGRAM N/A 02/25/2013   Procedure: CAROTID ANGIOGRAM;  Surgeon: Lorretta Harp, MD;  Location[facility] CATH LAB;  Service: Cardiovascular;  Laterality: N/A;  . ENDARTERECTOMY Right 03/06/2013   Procedure: ENDARTERECTOMY CAROTID-RIGHT;  Surgeon: Serafina Mitchell, MD;  Location: [place];  Service: Vascular;  Laterality: Right;  . ENDARTERECTOMY Left 05/07/2013   Procedure: LEFT CAROTID ARTERY ENDARTERECTOMY WITH VASCU-GUARD PATCH ANGIOPLASTY ;  Surgeon: Serafina Mitchell, MD;  Location: [place];  Service: Vascular;  Laterality: Left;  . LOBECTOMY Left 02/03/2016   Procedure: LEFT UPPER LOBECTOMY;  Surgeon: [name], MD;  Location:  [place];  Service: Thoracic;  Laterality: Left;  . LOWER EXTREMITY ANGIOGRAM N/A 02/25/2013   Procedure: LOWER EXTREMITY ANGIOGRAM;  Surgeon: Lorretta Harp, MD;  Location: [facility] CATH LAB;  Service: Cardiovascular;  Laterality: N/A;  . LOWER EXTREMITY ANGIOGRAM N/A 07/27/2013   Procedure: LOWER EXTREMITY ANGIOGRAM;  Surgeon: Lorretta Harp, MD;  Location: [facility] CATH LAB;  Service: Cardiovascular;  Laterality: N/A;  . PATCH ANGIOPLASTY Right 03/06/2013   Procedure: PATCH ANGIOPLASTY of Right Carotid Artery using Vascu-Guard Patch;  Surgeon: Serafina Mitchell, MD;  Location: [place];  Service: Vascular;  Laterality: Right;  . PERIPHERAL VASCULAR CATHETERIZATION N/A 11/15/2015   Procedure: Aortic Arch Angiography;  Surgeon: Serafina Mitchell, MD;  Location: [place] CV LAB;  Service: Cardiovascular;  Laterality: N/A;  . PERIPHERAL VASCULAR CATHETERIZATION Bilateral 11/15/2015   Procedure: Carotid Angiography;  Surgeon: Serafina Mitchell, MD;  Location: [place] CV LAB;  Service: Cardiovascular;  Laterality: Bilateral;  . PERIPHERAL VASCULAR CATHETERIZATION Left 11/15/2015   Procedure: Upper Extremity Angiography;  Surgeon: Serafina Mitchell, MD;  Location: [place] CV LAB;  Service: Cardiovascular;  Laterality: Left;  . PERIPHERAL VASCULAR CATHETERIZATION Left 11/15/2015   Procedure: Peripheral Vascular Intervention;  Surgeon: Serafina Mitchell, MD;  Location: [place] CV LAB;  Service: Cardiovascular;  Laterality: Left;  subclavian   . SALIVARY GLAND SURGERY     scar tissue removed from left saliva glad  . TUBAL LIGATION    . VIDEO ASSISTED THORACOSCOPY (VATS)/WEDGE RESECTION Left 02/03/2016   Procedure: VIDEO ASSISTED THORACOSCOPY;  Surgeon: [name], MD;  Location: [place];  Service: Thoracic;  Laterality: Left;    Family History  Problem Relation Age of Onset  . Stroke Mother   . Hypertension Mother   . Kidney failure Father     Social History Social History  Substance Use Topics  .  Smoking status: Former Smoker    Packs/day: 1.50    Years: 56.00    Types: Cigarettes    Quit date: 07/07/2013  . Smokeless tobacco: Former Systems developer    Quit date: 02/25/2013  . Alcohol use No    Allergies  Allergen Reactions  . No Known Allergies Other (See Comments)    Current Outpatient Prescriptions  Medication Sig Dispense Refill  . aspirin EC 81 MG tablet Take 81 mg by mouth daily.    . diazepam (VALIUM) 5 MG tablet Take 5 mg by mouth 2 (two) times daily. Anxiety    . diltiazem (DILT-XR) 180 MG 24 hr capsule Take 1 capsule (180 mg total) by mouth daily. 30 capsule 1  . fish oil-omega-3 fatty acids 1000 MG capsule Take 1 g by mouth daily.     . metFORMIN (GLUCOPHAGE) 500 MG tablet Take 500 mg by mouth daily with breakfast.     . metoprolol tartrate (LOPRESSOR) 25 MG tablet Take 0.5 tablets (12.5 mg total) by mouth 2 (two) times daily. 30 tablet 1  . naphazoline-glycerin (CLEAR EYES) 0.012-0.2 % SOLN Place 1-2 drops into both eyes 4 (four) times daily as needed (for itchy eyes).    . NONFORMULARY  OR COMPOUNDED ITEM [facility]:  Combo Pain #1 - Baclofen 3%, Gabapentin 8%, Aripiprazole 0.7%, Ketorolac 8%, Licocaine 5%, dispense 240grams, apply 1-2 pumps to focal pain area 3-4 times daily, rub in 1-2 minutes, +3Refills (Patient taking differently: Apply 1 application topically 3 (three) times daily as needed (for foot pain.). [facility]:  Combo Pain #1 - Baclofen 3%, Gabapentin 8%, Aripiprazole 3.7%, Ketorolac 8%, Licocaine 5%, dispense 240grams, apply 1-2 pumps to focal pain area 3-4 times daily, rub in 1-2 minutes, +3Refills) 240 each 3  . oxyCODONE (OXY IR/ROXICODONE) 5 MG immediate release tablet Take 1 tablet (5 mg total) by mouth every 6 (six) hours as needed for severe pain. 30 tablet 0  . pantoprazole (PROTONIX) 40 MG tablet Take 40 mg by mouth 2 (two) times daily.     . pravastatin (PRAVACHOL) 40 MG tablet Take 40 mg by mouth daily.     No current  facility-administered medications for this visit.    Vitals:   03/15/16 1323  Weight: 177 lb 12.8 oz (80.6 kg)  Height: _0  (1.626 m)   Blood pressure 128/74, pulse 78, temperature 98.4 F (36.9 C), temperature source Oral, resp. rate 17, height _1  (1.626 m), weight 177 lb 12.8 oz (80.6 kg), SpO2 99 %. Review of Systems  Constitutional: positive for weight loss Eyes: negative Ears, nose, mouth, throat, and face: negative Respiratory: positive for cough, dyspnea on exertion and pleurisy/chest pain Cardiovascular: negative Gastrointestinal: negative Genitourinary:negative Integument/breast: negative Hematologic/lymphatic: negative Musculoskeletal:negative Neurological: positive for headaches Behavioral/Psych: negative Endocrine: negative Allergic/Immunologic: negative  Physical Exam  TGG:YIRSW, healthy, no distress, well nourished and well developed SKIN: skin color, texture, turgor are normal, no rashes or significant lesions HEAD: Normocephalic, No masses, lesions, tenderness or abnormalities EYES: normal, PERRLA, Conjunctiva are pink and non-injected EARS: External ears normal, Canals clear OROPHARYNX:no exudate, no erythema and lips, buccal mucosa, and tongue normal  NECK: supple, no adenopathy, no JVD LYMPH:  no palpable lymphadenopathy, no hepatosplenomegaly BREAST:not examined LUNGS: clear to auscultation , and palpation HEART: regular rate & rhythm, no murmurs and no gallops ABDOMEN:abdomen soft, non-tender, normal bowel sounds and no masses or organomegaly BACK: No CVA tenderness, Range of motion is normal EXTREMITIES:no joint deformities, effusion, or inflammation, no edema  NEURO: alert & oriented x 3 with fluent speech, no focal motor/sensory deficits  PERFORMANCE STATUS: ECOG 1  LABORATORY DATA: Lab Results  Component Value Date   WBC 9.1 03/15/2016   HGB 13.8 03/15/2016   HCT 43.3 03/15/2016   MCV 87.3 03/15/2016   PLT 347 03/15/2016       Chemistry      Component Value Date/Time   NA 136 03/15/2016 1227   K 5.3 (H) 03/15/2016 1227   CL 99 (L) 02/07/2016 0431   CO2 29 03/15/2016 1227   BUN 15.9 03/15/2016 1227   CREATININE 0.8 03/15/2016 1227      Component Value Date/Time   CALCIUM 10.0 03/15/2016 1227   ALKPHOS 105 03/15/2016 1227   AST 21 03/15/2016 1227   ALT 18 03/15/2016 1227   BILITOT 0.29 03/15/2016 1227       RADIOGRAPHIC STUDIES: Dg Chest 2 View  Result Date: 02/28/2016 CLINICAL DATA:  Status post wedge resection for malignant Celsius in the left lung on October 20th. History of COPD. Former smoker. EXAM: CHEST  2 VIEW COMPARISON:  PA and lateral chest x-ray of February 16, 2016 FINDINGS: There has been further interval improvement in the appearance of the left infrahilar  region. There remains volume loss on the left with tenting of the hemidiaphragm. A trace of pleural fluid layers posteriorly and laterally on the left. The left apical pneumothorax has resolved. The right lung is clear. The heart is normal in size. There is calcification in the wall of the aortic arch. IMPRESSION: No residual left-sided pneumothorax. Interval improvement in the appearance of the left infrahilar region. Persistent small left pleural effusion. Thoracic aortic atherosclerosis. Electronically Signed   By: David  Martinique M.D.   On: 02/28/2016 10:40   Dg Chest 2 View  Result Date: 02/16/2016 CLINICAL DATA:  Post LEFT lobectomy, LEFT chest tube removed 2 days ago, diabetes mellitus, hypertension, COPD, smoker EXAM: CHEST  2 VIEW COMPARISON:  02/15/2016 FINDINGS: Normal heart size, mediastinal contours, and pulmonary vascularity. Atherosclerotic calcification aorta. Stent identified at the origin of the LEFT subclavian artery. COPD changes with minimal RIGHT basilar scarring. Small LEFT apex pneumothorax little changed. In addition, small air-fluid level in upper anterior LEFT chest compatible with a small loculated hydropneumothorax. No  infiltrate identified. Bones demineralized. IMPRESSION: Small LEFT apex pneumothorax little changed. Small loculated hydropneumothorax at anterior upper LEFT hemithorax. COPD changes. Aortic atherosclerosis. Electronically Signed   By: Lavonia Dana M.D.   On: 02/16/2016 07:53   Dg Chest 2v Repeat Same Day  Result Date: 02/15/2016 CLINICAL DATA:  Status post chest tube removal EXAM: CHEST  2 VIEW COMPARISON:  Study obtained earlier in the day FINDINGS: Left chest tube is been removed. There is a stable small left apical pneumothorax without tension component. There is mild scarring in the left base. There is slight atelectasis in the right base. The lungs elsewhere clear. Heart size and pulmonary vascularity are normal. No adenopathy. There is atherosclerotic calcification in the aorta. There is a proximal left subclavian stent. No bone lesions are evident. IMPRESSION: Persistent small left apical pneumothorax without tension component. No change in pneumothorax after chest tube removal. Mild scarring left base. Mild atelectasis right base. No airspace consolidation. Stable cardiac silhouette. There is aortic atherosclerosis. Electronically Signed   By: Lowella Grip III M.D.   On: 02/15/2016 13:20   Dg Chest Port 1 View  Result Date: 02/15/2016 CLINICAL DATA:  Chest tube, diabetes mellitus, hypertension, former smoker, COPD EXAM: PORTABLE CHEST 1 VIEW COMPARISON:  Portable exam 0736 hours compared to 02/14/2016 FINDINGS: LEFT thoracostomy tube with decrease in LEFT pneumothorax since previous exam, a small residual LEFT apex pneumothorax seen. Normal heart size, mediastinal contours, and pulmonary vascularity. Aortic atherosclerosis with stent at origin of the LEFT subclavian artery. Mild basilar atelectasis greater on LEFT. Lungs otherwise clear. No pleural effusion. Bones demineralized. IMPRESSION: Decrease in LEFT pneumothorax since previous exam with small residual at LEFT apex. Bibasilar atelectasis.  Electronically Signed   By: Lavonia Dana M.D.   On: 02/15/2016 08:01    ASSESSMENT: this is a very pleasant 75 years old white female recently diagnosed with stage IA (T1a, N0, M0) non-small cell lung cancer, squamous cell carcinoma status post left lower lobectomy with lymph node sampling in October 2017.   PLAN: I had a lengthy discussion with the patient today about her current disease stage, prognosis and treatment options. I discussed the final pathology with the patient today. I explained to the patient that the 5 year survival for patient with a stage IA is around 80%. I also explained to the patient that there is no survival benefit for adjuvant systemic chemotherapy for patient with a stage IA non-small cell lung cancer. I  recommended for the patient to continue on observation with close monitoring. I will see her back for follow-up visit in 6 months for reevaluation with repeat CT scan of the chest for restaging of her disease. The patient agreed to the current plan.  She was seen during the multidisciplinary thoracic oncology clinic today by medical oncology, thoracic navigator, social worker and physical therapist.  The patient voices understanding of current disease status and treatment options and is in agreement with the current care plan.  All questions were answered. The patient knows to call the clinic with any problems, questions or concerns. We can certainly see the patient much sooner if necessary.  Thank you so much for allowing me to participate in the care of Caitlin Oliver. I will continue to follow up the patient with you and assist in her care.  I spent 40 minutes counseling the patient face to face. The total time spent in the appointment was 60 minutes.  Disclaimer: This note was dictated with voice recognition software. Similar sounding words can inadvertently be transcribed and may not be corrected upon review.   Tresean Mattix K. March 15, 2016, 2:20  PM

## 2016-03-19 ENCOUNTER — Other Ambulatory Visit: Payer: Self-pay | Admitting: *Deleted

## 2016-03-19 NOTE — Patient Outreach (Signed)
Transition of care call. No one answered. I left a message and requested a return call.  Caitlin Oliver [facility] [phone]  Called pt again and she was home. She reports she has been to appts and is getting good results. She says her chest/epigastirc pain is surgical discomfort and she has been given some more pain medication which she is tapering off. She is trying to be as active as she can be. She asks if I was the one who said I could send HCPOA and LIving Will documents and I said I was. I will check to see if those have been sent out. I have reminded her to call if she has any problems and I will call her one more time next week.  Caitlin Oliver [facility] [phone]

## 2016-03-22 ENCOUNTER — Telehealth: Payer: Self-pay | Admitting: *Deleted

## 2016-03-22 DIAGNOSIS — C3412 Malignant neoplasm of upper lobe, left bronchus or lung: Secondary | ICD-10-CM

## 2016-03-22 NOTE — Telephone Encounter (Signed)
Oncology Nurse Navigator Documentation  Oncology Nurse Navigator Flowsheets 03/22/2016  Navigator Location [facility]  Navigator Encounter Type Telephone/I called Caitlin Oliver to follow up regarding her pain issues after surgery.  She states she is still uncomfortable at times.  I asked about her pain medication.  She states this helps.  I offered her PT to help with pain issues. She agreed.  I placed referral.   Telephone Outgoing Call  Treatment Phase Follow-up  Barriers/Navigation Needs Coordination of Care  Interventions Coordination of Care  Coordination of Care Appts;PT  Acuity Level 2  Acuity Level 2 Assistance expediting appointments  Time Spent with Patient 30

## 2016-03-26 ENCOUNTER — Other Ambulatory Visit: Payer: Self-pay | Admitting: *Deleted

## 2016-03-26 ENCOUNTER — Encounter: Payer: Self-pay | Admitting: Neurology

## 2016-03-26 ENCOUNTER — Ambulatory Visit (INDEPENDENT_AMBULATORY_CARE_PROVIDER_SITE_OTHER): Payer: PPO | Admitting: Neurology

## 2016-03-26 VITALS — BP 131/78 | HR 95 | Ht 64.0 in | Wt 179.6 lb

## 2016-03-26 DIAGNOSIS — I771 Stricture of artery: Secondary | ICD-10-CM | POA: Diagnosis not present

## 2016-03-26 DIAGNOSIS — M5481 Occipital neuralgia: Secondary | ICD-10-CM | POA: Diagnosis not present

## 2016-03-26 DIAGNOSIS — I6522 Occlusion and stenosis of left carotid artery: Secondary | ICD-10-CM | POA: Diagnosis not present

## 2016-03-26 NOTE — Patient Instructions (Addendum)
-  continue ASA and pravastatin for stroke prevention - check BP at home and record  - follow up with Dr. Trula Slade and Dr. Gwenlyn Found for carotid stenosis.  - Follow up with your primary care physician for stroke risk factor modification. Recommend maintain blood pressure goal 130-150/80, diabetes with hemoglobin A1c goal below 7.0% and lipids with LDL cholesterol goal below 70 mg/dL.  - consider occipital nerve block if occipital neuralgia recurrence - follow up as needed.

## 2016-03-26 NOTE — Progress Notes (Signed)
NEUROLOGY CLINIC FOLLOW UP NOTE  NAME: Caitlin Oliver DOB: 11-08-1940  HPI: Caitlin Oliver is a 75 y.o. female with PMH of HTN, HLD, PVD, and carotid stenosis s/p b/l CEAs who presents as a new patient for Episode of double vision and vertigo.   She has bilateral carotid stenosis s/p bilateral CEAs around 2014 about 6 weeks apart with Dr. Trula Slade. Followed with Dr. Gwenlyn Found and CUS on 09/01/14 showed left ICA 50-69% stenosis with left VA retrograde flow. She had recent CUS on 04/27/15 showed right ICA 1-39% stenosis but left ICA progressed to 60-79% stenosis with left VA retrograde flow.  Patient stated that on 10/12/2015 she was in the drugstore reading signs over her head, when she bent down her head she felt sudden onset double vision, and vertigo. Double vision was horizontal diplopia lasted about one hour and gradually resolved. Vertigo feels like head round and round, and she had to sit down due to imbalance, no nausea vomiting, no fall, lasted about one hour gradually resolved. After that, she continued to feel swimmy headed with headache, walking to the right side, had to hold on things on walking. Went to see PCP that day, blood pressure 140/70, recommended to go to ER, however patient refused, only agreed to do CTA head and neck. She had to use a walker for the next 2 days, and she still felt lightheaded for about 2 weeks. She had a CTA head and neck on 10/20/2015 which showed stenosis of right CCA and distal right ICA, as well as left ICA 76% stenosis. In addition, left subclavian artery significant narrowing and origin, proximal to left VA takeoff. Consistent with left VA regional grade flow seen on CUS and therefore subclavian steal syndrome.   Patient today in clinic continues to complain of lightheadedness, posterior head headache, light sensitivity. Check blood pressure 92/56. She stated that she took BP meds this morning. She also stated that her BP is also low at home.    She has hx of  HTN, HLD following with Dr. Gwenlyn Found, on pravastatin, diltiazem, HCTZ, metoprolol and lisinopirl. She also has PVD s/p diamondback orbital rotational atherectomy, PTA and stenting using a cast stents of her iliac arteries by Dr. Judithann Sauger 07/27/13 resulted in complete resolution of her claudication symptoms and normalization of her Dopplers.   She had GI bleeding in 2016 taking aspirin and the Plavix after stents placed in lower extremities. Had a GI consult, required 3 units of blood transfusion. GI workup showed bleeding ulcer in the stomach. She was told not to take any Plavix anymore. She is on aspirin 81 currently. Follow up with GI later was told ulcer was healed.  11/22/15 follow up - patient had followed up with Dr. Trula Slade and had cerebral angiogram as well as left subclavian artery stenting due to high-grade stenosis with subclavian steal syndrome. Patient tolerating with the procedure pretty well. BP check at home much better at 120-140s, today in clinic 100/69. However, patient continue to complain of neck pain, radiating headache to bilateral temporal and vertex, and lightheadedness. Found to have bilateral occipital neuralgia.   Interval history: During the interval time, pt has been doing well from neuro standpoint. She had occipital nerve blocks in 11/2015 and 12/2015, and since then no more occipital neuralgia. She followed with [facility] Dr. Trula Slade in 12/2015 and left ICA 60-79% stenosis but VA all patent. She had left upper lobe lung resection for lung cancer treatment in 01/2016 without chemo and radiation. However, she  continued to have left chest pain post op and currently under treatment with pain management. She is only on cardizam and metoprolol now and BP stable. Today 131/78.   Past Medical History:  Diagnosis Date  . Anxiety   . Carotid artery disease ([place])   . Claudication ([place])   . COPD (chronic obstructive pulmonary disease) ([place])   . Diabetes mellitus   . Dizzy   . GERD  (gastroesophageal reflux disease)   . Headache   . History of kidney stones   . Hypercholesteremia   . Hypertension   . 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 Shortness of breath    with exertion  . Stomach ulcer   . Tobacco abuse    Past Surgical History:  Procedure Laterality Date  . ABDOMINAL HYSTERECTOMY    . APPENDECTOMY    . BREAST REDUCTION SURGERY    . CAROTID ANGIOGRAM N/A 02/25/2013   Procedure: CAROTID ANGIOGRAM;  Surgeon: Lorretta Harp, MD;  Location[facility] CATH LAB;  Service: Cardiovascular;  Laterality: N/A;  . ENDARTERECTOMY Right 03/06/2013   Procedure: ENDARTERECTOMY CAROTID-RIGHT;  Surgeon: Serafina Mitchell, MD;  Location: [place];  Service: Vascular;  Laterality: Right;  . ENDARTERECTOMY Left 05/07/2013   Procedure: LEFT CAROTID ARTERY ENDARTERECTOMY WITH VASCU-GUARD PATCH ANGIOPLASTY ;  Surgeon: Serafina Mitchell, MD;  Location: [place];  Service: Vascular;  Laterality: Left;  . LOBECTOMY Left 02/03/2016   Procedure: LEFT UPPER LOBECTOMY;  Surgeon: [name], MD;  Location: [place];  Service: Thoracic;  Laterality: Left;  . LOWER EXTREMITY ANGIOGRAM N/A 02/25/2013   Procedure: LOWER EXTREMITY ANGIOGRAM;  Surgeon: Lorretta Harp, MD;  Location: [facility] CATH LAB;  Service: Cardiovascular;  Laterality: N/A;  . LOWER EXTREMITY ANGIOGRAM N/A 07/27/2013   Procedure: LOWER EXTREMITY ANGIOGRAM;  Surgeon: Lorretta Harp, MD;  Location: [facility] CATH LAB;  Service: Cardiovascular;  Laterality: N/A;  . PATCH ANGIOPLASTY Right 03/06/2013   Procedure: PATCH ANGIOPLASTY of Right Carotid Artery using Vascu-Guard Patch;  Surgeon: Serafina Mitchell, MD;  Location: [place];  Service: Vascular;  Laterality: Right;  . PERIPHERAL VASCULAR CATHETERIZATION N/A 11/15/2015   Procedure: Aortic Arch Angiography;  Surgeon: Serafina Mitchell, MD;  Location: [place] CV LAB;  Service: Cardiovascular;  Laterality: N/A;  . PERIPHERAL VASCULAR CATHETERIZATION  Bilateral 11/15/2015   Procedure: Carotid Angiography;  Surgeon: Serafina Mitchell, MD;  Location: [place] CV LAB;  Service: Cardiovascular;  Laterality: Bilateral;  . PERIPHERAL VASCULAR CATHETERIZATION Left 11/15/2015   Procedure: Upper Extremity Angiography;  Surgeon: Serafina Mitchell, MD;  Location: [place] CV LAB;  Service: Cardiovascular;  Laterality: Left;  . PERIPHERAL VASCULAR CATHETERIZATION Left 11/15/2015   Procedure: Peripheral Vascular Intervention;  Surgeon: Serafina Mitchell, MD;  Location: [place] CV LAB;  Service: Cardiovascular;  Laterality: Left;  subclavian   . SALIVARY GLAND SURGERY     scar tissue removed from left saliva glad  . TUBAL LIGATION    . VIDEO ASSISTED THORACOSCOPY (VATS)/WEDGE RESECTION Left 02/03/2016   Procedure: VIDEO ASSISTED THORACOSCOPY;  Surgeon: [name], MD;  Location: [place];  Service: Thoracic;  Laterality: Left;   Family History  Problem Relation Age of Onset  . Stroke Mother   . Hypertension Mother   . Kidney failure Father    Current Outpatient Prescriptions  Medication Sig Dispense Refill  . aspirin EC 81 MG tablet Take 81 mg by mouth daily.    [name]  diazepam (VALIUM) 5 MG tablet Take 5 mg by mouth 2 (two) times daily. Anxiety    . diltiazem (DILT-XR) 180 MG 24 hr capsule Take 1 capsule (180 mg total) by mouth daily. 30 capsule 1  . fish oil-omega-3 fatty acids 1000 MG capsule Take 1 g by mouth daily.     . metFORMIN (GLUCOPHAGE) 500 MG tablet Take 500 mg by mouth daily with breakfast.     . metoprolol tartrate (LOPRESSOR) 25 MG tablet Take 0.5 tablets (12.5 mg total) by mouth 2 (two) times daily. 30 tablet 1  . naphazoline-glycerin (CLEAR EYES) 0.012-0.2 % SOLN Place 1-2 drops into both eyes 4 (four) times daily as needed (for itchy eyes).    . NONFORMULARY OR COMPOUNDED ITEM [facility]:  Combo Pain #1 - Baclofen 3%, Gabapentin 8%, Aripiprazole 3.0%, Ketorolac 8%, Licocaine 5%, dispense 240grams, apply 1-2 pumps  to focal pain area 3-4 times daily, rub in 1-2 minutes, +3Refills (Patient taking differently: Apply 1 application topically 3 (three) times daily as needed (for foot pain.). [facility]:  Combo Pain #1 - Baclofen 3%, Gabapentin 8%, Aripiprazole 9.4%, Ketorolac 8%, Licocaine 5%, dispense 240grams, apply 1-2 pumps to focal pain area 3-4 times daily, rub in 1-2 minutes, +3Refills) 240 each 3  . oxyCODONE (OXY IR/ROXICODONE) 5 MG immediate release tablet Take 1 tablet (5 mg total) by mouth every 6 (six) hours as needed for severe pain. 30 tablet 0  . pantoprazole (PROTONIX) 40 MG tablet Take 40 mg by mouth 2 (two) times daily.     . pravastatin (PRAVACHOL) 40 MG tablet Take 40 mg by mouth daily.     No current facility-administered medications for this visit.    Allergies  Allergen Reactions  . No Known Allergies Other (See Comments)   Social History   Social History  . Marital status: Widowed    Spouse name: N/A  . Number of children: N/A  . Years of education: N/A   Occupational History  . Not on file.   Social History Main Topics  . Smoking status: Former Smoker    Packs/day: 1.50    Years: 56.00    Types: Cigarettes    Quit date: 07/07/2013  . Smokeless tobacco: Former Systems developer    Quit date: 02/25/2013  . Alcohol use No  . Drug use: No  . Sexual activity: No   Other Topics Concern  . Not on file   Social History Narrative  . No narrative on file    Review of Systems Full 14 system review of systems performed and notable only for those listed, all others are neg:  Constitutional:   Cardiovascular: chest pain Ear/Nose/Throat:   Skin:  Eyes:   Respiratory:   Gastroitestinal:   Genitourinary:  Hematology/Lymphatic:   Endocrine:  Musculoskeletal:  Back pain, muscle cramps Allergy/Immunology:   Neurological:  Headache, dizziness Psychiatric: nervous Sleep:    Physical Exam  Vitals:   03/26/16 1136  BP: 131/78  Pulse: 95    General - Well  nourished, well developed, in no apparent distress.  Ophthalmologic - fundi not visualized due to photophobia.  Cardiovascular - Regular rate and rhythm with no murmur.   Neck - supple, no nuchal rigidity.   Mental Status -  Level of arousal and orientation to time, place, and person were intact. Language including expression, naming, repetition, comprehension, reading, and writing was assessed and found intact. Fund of Knowledge was assessed and was intact.  Cranial Nerves II - XII - II -  Visual field intact OU. III, IV, VI - Extraocular movements intact. V - Facial sensation intact bilaterally. VII - left nasolabial fold flattening, this is her baseline as per patient. VIII - Hearing & vestibular intact bilaterally, no nystagmus. X - Palate elevates symmetrically. XI - Chin turning & shoulder shrug intact bilaterally. XII - Tongue protrusion intact.  Motor Strength - The patient's strength was normal in all extremities and pronator drift was absent.  Bulk was normal and fasciculations were absent.   Motor Tone - Muscle tone was assessed at the neck and appendages and was normal.  Reflexes - The patient's reflexes were normal in all extremities and she had no pathological reflexes.  Sensory - Light touch, temperature/pinprick were assessed and were normal.    Coordination - The patient had normal movements in the hands with no ataxia or dysmetria.  Tremor was absent.  Gait and Station - slow speed, and small stride, but steady without tendency to fall.    Imaging  I have personally reviewed the radiological images below and agree with the radiology interpretations.  CT HEAD Prominent small vessel disease changes without CT evidence of large acute infarct. Small or medium size infarct would be difficult to exclude given the degree white matter changes. Global atrophy without hydrocephalus.  CTA NECK 3 vessel arch with calcified plaque. Aortic atherosclerosis. Plaque  with moderate to marked narrowing proximal left subclavian artery, moderate narrowing proximal innominate and proximal left common carotid artery. Moderate narrowing proximal right subclavian artery. Moderate to mark narrowing proximal right common carotid artery. Post right carotid endarterectomy. Plaque with mild narrowing distal right common carotid artery. No significant stenosis right carotid bifurcation. Plaque distal right internal carotid artery with moderate to marked narrowing proximal to the skullbase. Moderate narrowing origin of the left common carotid artery. Plaque with mild to moderate narrowing mid to distal left common carotid artery. Plaque with 76% diameter stenosis proximal left internal carotid artery. Ectatic proximal vertebral arteries without high-grade stenosis. Several thyroid lesions measuring up to 3.1 cm. Thyroid ultrasound recommended for further delineation.  CTA HEAD  Calcified plaque with mild to slightly mild narrowing cavernous segment internal carotid artery bilaterally. Fetal contribution to the posterior cerebral artery bilaterally. Mild narrowing distal vertebral arteries. Mild narrowing basilar artery. Mild narrowing portions of the posterior cerebral arteries bilaterally.  12/26/15 CUS -  60-79% left recurrent carotid stenosis.  An 1-39 percent right carotid stenosis.  Both vertebral arteries are antegrade  Lab Review 06/21/15 LDL 75 and TG 217    Assessment:   In summary, Caitlin Oliver is a 75 y.o. female with PMH of HTN, HLD, PVD s/p stents in LEs, carotid stenosis s/p b/l CEAs had episode of transient double vision and vertigo on 10/12/15 with neck movement. Continue to have lightheadedness and posterior headache. Her recent CUS indicating progressive left ICA re-stenosis and left VA retrograde flow. Recent CTA head and neck showed left ICA 75% stenosis, right ICA distal stenosis, and left subclavian artery origin severe stenosis. Taken together, it  is consistent with subclavian steal syndrome. In addition, patient has worsening symptoms with neck moment, need to consider bow hunter syndrome. Patient BP low at home, today 92/56, likely BP medication related. She needs higher BP goal for any great cerebral perfusion due to multiple vessel stenosis. Will discontinue lisinopril and HCTZ. Had urgent [facility] consultation and received left subclavian artery stenting. Continue to follow up with [facility] and repeat CUS showed left ICA 60-79% stenosis. MRI brain not done due  to metal stent in body. BP improved after medication adjustment. However, patient continues to have bilateral occipital neuralgia. Received occipital nerve block x 2 and currently free of occipital neuralgia. She had left lung cancer surgery in 01/2016 and currently has pain management for left chest pain.    Plan: - continue ASA and pravastatin for stroke prevention - check BP at home and record  - follow up with Dr. Trula Slade and Dr. Gwenlyn Found for carotid stenosis.  - Follow up with your primary care physician for stroke risk factor modification. Recommend maintain blood pressure goal 130-150/80, diabetes with hemoglobin A1c goal below 7.0% and lipids with LDL cholesterol goal below 70 mg/dL.  - consider occipital nerve block if occipital neuralgia recurrence - follow up as needed.   No orders of the defined types were placed in this encounter.   Meds ordered this encounter  Medications  . DISCONTD: oxyCODONE (OXY IR/ROXICODONE) 5 MG immediate release tablet    Sig: Take 5 mg by mouth.    Patient Instructions  - continue ASA and pravastatin for stroke prevention - check BP at home and record  - follow up with Dr. Trula Slade and Dr. Gwenlyn Found for carotid stenosis.  - Follow up with your primary care physician for stroke risk factor modification. Recommend maintain blood pressure goal 130-150/80, diabetes with hemoglobin A1c goal below 7.0% and lipids with LDL cholesterol goal below 70 mg/dL.  -  consider occipital nerve block if occipital neuralgia recurrence - follow up as needed.   Rosalin Hawking, MD PhD [facility] [address], [place], [place] [address] [phone]

## 2016-03-27 ENCOUNTER — Ambulatory Visit: Payer: PPO | Attending: Internal Medicine | Admitting: Physical Therapy

## 2016-03-27 DIAGNOSIS — Z483 Aftercare following surgery for neoplasm: Secondary | ICD-10-CM | POA: Diagnosis not present

## 2016-03-27 DIAGNOSIS — R29898 Other symptoms and signs involving the musculoskeletal system: Secondary | ICD-10-CM | POA: Diagnosis not present

## 2016-03-27 NOTE — Therapy (Signed)
[facility] [place], [place], [address] Phone: [phone]   Fax:  [phone]  Physical Therapy Treatment  Patient Details  Name: Caitlin Oliver MRN: [id number] Date of Birth: 06-08-1940 Referring Provider: Dr. Curt Bears  Encounter Date: 03/27/2016      PT End of Session - 03/27/16 1508    Visit Number 2   Number of Visits 9   Date for PT Re-Evaluation 04/30/16   PT Start Time 0157  Late arrival to trearment area f/b initial conversation with patient as she has transferred from another clinic.   PT Stop Time 0232   PT Time Calculation (min) 35 min   Activity Tolerance Patient tolerated treatment well   Behavior During Therapy WFL for tasks assessed/performed      Past Medical History:  Diagnosis Date  . Anxiety   . Carotid artery disease ([place])   . Claudication ([place])   . COPD (chronic obstructive pulmonary disease) ([place])   . Diabetes mellitus   . Dizzy   . GERD (gastroesophageal reflux disease)   . Headache   . History of kidney stones   . Hypercholesteremia   . Hypertension   . 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 Shortness of breath    with exertion  . Stomach ulcer   . Tobacco abuse     Past Surgical History:  Procedure Laterality Date  . ABDOMINAL HYSTERECTOMY    . APPENDECTOMY    . BREAST REDUCTION SURGERY    . CAROTID ANGIOGRAM N/A 02/25/2013   Procedure: CAROTID ANGIOGRAM;  Surgeon: Lorretta Harp, MD;  Location: [facility] CATH LAB;  Service: Cardiovascular;  Laterality: N/A;  . ENDARTERECTOMY Right 03/06/2013   Procedure: ENDARTERECTOMY CAROTID-RIGHT;  Surgeon: Serafina Mitchell, MD;  Location: [place];  Service: Vascular;  Laterality: Right;  . ENDARTERECTOMY Left 05/07/2013   Procedure: LEFT CAROTID ARTERY ENDARTERECTOMY WITH VASCU-GUARD PATCH ANGIOPLASTY ;  Surgeon: Serafina Mitchell, MD;  Location: [place];  Service: Vascular;  Laterality: Left;   . LOBECTOMY Left 02/03/2016   Procedure: LEFT UPPER LOBECTOMY;  Surgeon: [name], MD;  Location: [place];  Service: Thoracic;  Laterality: Left;  . LOWER EXTREMITY ANGIOGRAM N/A 02/25/2013   Procedure: LOWER EXTREMITY ANGIOGRAM;  Surgeon: Lorretta Harp, MD;  Location[facility] CATH LAB;  Service: Cardiovascular;  Laterality: N/A;  . LOWER EXTREMITY ANGIOGRAM N/A 07/27/2013   Procedure: LOWER EXTREMITY ANGIOGRAM;  Surgeon: Lorretta Harp, MD;  Location: [facility] CATH LAB;  Service: Cardiovascular;  Laterality: N/A;  . PATCH ANGIOPLASTY Right 03/06/2013   Procedure: PATCH ANGIOPLASTY of Right Carotid Artery using Vascu-Guard Patch;  Surgeon: Serafina Mitchell, MD;  Location: [place];  Service: Vascular;  Laterality: Right;  . PERIPHERAL VASCULAR CATHETERIZATION N/A 11/15/2015   Procedure: Aortic Arch Angiography;  Surgeon: Serafina Mitchell, MD;  Location: [place] CV LAB;  Service: Cardiovascular;  Laterality: N/A;  . PERIPHERAL VASCULAR CATHETERIZATION Bilateral 11/15/2015   Procedure: Carotid Angiography;  Surgeon: Serafina Mitchell, MD;  Location: [place] CV LAB;  Service: Cardiovascular;  Laterality: Bilateral;  . PERIPHERAL VASCULAR CATHETERIZATION Left 11/15/2015   Procedure: Upper Extremity Angiography;  Surgeon: Serafina Mitchell, MD;  Location: [place] CV LAB;  Service: Cardiovascular;  Laterality: Left;  . PERIPHERAL VASCULAR CATHETERIZATION Left 11/15/2015   Procedure: Peripheral Vascular Intervention;  Surgeon: Serafina Mitchell, MD;  Location: [place] CV LAB;  Service: Cardiovascular;  Laterality: Left;  subclavian   .  SALIVARY GLAND SURGERY     scar tissue removed from left saliva glad  . TUBAL LIGATION    . VIDEO ASSISTED THORACOSCOPY (VATS)/WEDGE RESECTION Left 02/03/2016   Procedure: VIDEO ASSISTED THORACOSCOPY;  Surgeon: [name], MD;  Location: [place];  Service: Thoracic;  Laterality: Left;    There were no vitals filed for this visit.      Subjective  Assessment - 03/27/16 1509    Currently in Pain? Yes   Pain Score 7    Pain Location Flank   Pain Orientation Left   Pain Descriptors / Indicators Sharp;Dull   Pain Type Surgical pain   Pain Onset More than a month ago                         [facility] Adult PT Treatment/Exercise - 03/27/16 0001      Modalities   Modalities Electrical Stimulation     Electrical Stimulation   Electrical Stimulation Location Left thoracic/rib region.   Electrical Stimulation Action IFC at 100% scan x 20 minutes.   Electrical Stimulation Parameters 80-150 Hz.   Electrical Stimulation Goals Pain                       Lung Clinic Goals - 03/15/16 1458      Patient will be able to verbalize understanding of the benefit of exercise to decrease fatigue.   Status Achieved     Patient will be able to verbalize the importance of posture.   Status Achieved     Patient will be able to demonstrate diaphragmatic breathing for improved lung function.   Status Achieved     Patient will be able to verbalize understanding of the role of physical therapy to prevent functional decline and who to contact if physical therapy is needed.   Status Achieved             Plan - 03/27/16 1511    Clinical Impression Statement Proceeded with electrical stimulation per recommendation of evaluating physical therapist.  Patient tolerated treatment well and felt better following.   PT Treatment/Interventions ADLs/Self Care Home Management;Electrical Stimulation;Moist Heat;Cryotherapy;Therapeutic exercise;Patient/family education;Manual techniques;Scar mobilization;Passive range of motion;Taping   PT Next Visit Plan Try gentle STW/M.      Patient will benefit from skilled therapeutic intervention in order to improve the following deficits and impairments:  Pain, Decreased range of motion  Visit Diagnosis: Aftercare following surgery for neoplasm  Other symptoms and signs involving the  musculoskeletal system     Problem List Patient Active Problem List   Diagnosis Date Noted  . Cancer of left lung ([place]) 02/03/2016  . COPD GOLD II 12/08/2015  . Solitary pulmonary nodule 12/08/2015  . Bilateral occipital neuralgia 11/22/2015  . Double vision 10/28/2015  . TIA (transient ischemic attack) 10/28/2015  . Subclavian artery stenosis, left ([place]) 10/28/2015  . Subclavian steal syndrome 10/28/2015  . Acute blood loss anemia 10/20/2013  . C. difficile colitis 10/19/2013  . GI bleed 10/18/2013  . COPD 10/18/2013  . Bleeding gastrointestinal 10/18/2013  . Hypoxia 10/10/2013  . Bronchitis 10/08/2013  . PVD (peripheral vascular disease) with claudication ([employer]) 07/29/2013  . Carotid stenosis 05/07/2013  . Carotid artery narrowing 05/07/2013  . Occlusion and stenosis of carotid artery without mention of cerebral infarction 03/02/2013  . Carotid artery obstruction 03/02/2013  . Carotid artery disease ([place]) 02/27/2013  . Tobacco abuse 02/27/2013  . Compulsive tobacco user syndrome 02/27/2013  . Claudication (  [place]) 01/20/2013  . Essential hypertension 01/20/2013  . Type 2 diabetes mellitus ([place]) 01/20/2013  . Hyperlipidemia 01/20/2013    Tsutomu Barfoot, [place] MPT 03/27/2016, 3:13 PM  [facility] [address] [place], [place], [address] Phone: [phone]   Fax:  [phone]  Name: NYALA KIRCHNER MRN: [id number] Date of Birth: 13-Jul-1940

## 2016-04-02 ENCOUNTER — Ambulatory Visit: Payer: PPO | Admitting: Physical Therapy

## 2016-04-02 DIAGNOSIS — R29898 Other symptoms and signs involving the musculoskeletal system: Secondary | ICD-10-CM

## 2016-04-02 DIAGNOSIS — Z483 Aftercare following surgery for neoplasm: Secondary | ICD-10-CM | POA: Diagnosis not present

## 2016-04-02 NOTE — Therapy (Signed)
[facility] [place], [place], [address] Phone: [phone]   Fax:  [phone]  Physical Therapy Treatment  Patient Details  Name: Caitlin Oliver MRN: [id number] Date of Birth: 10/13/1940 Referring Provider: Dr. Curt Bears  Encounter Date: 04/02/2016      PT End of Session - 04/02/16 1136    Visit Number 3   Number of Visits 9   Date for PT Re-Evaluation 04/30/16   PT Start Time 1115   PT Stop Time 1150   PT Time Calculation (min) 35 min   Activity Tolerance Patient tolerated treatment well   Behavior During Therapy [facility] for tasks assessed/performed      Past Medical History:  Diagnosis Date  . Anxiety   . Carotid artery disease ([place])   . Claudication ([place])   . COPD (chronic obstructive pulmonary disease) ([place])   . Diabetes mellitus   . Dizzy   . GERD (gastroesophageal reflux disease)   . Headache   . History of kidney stones   . Hypercholesteremia   . Hypertension   . 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 Shortness of breath    with exertion  . Stomach ulcer   . Tobacco abuse     Past Surgical History:  Procedure Laterality Date  . ABDOMINAL HYSTERECTOMY    . APPENDECTOMY    . BREAST REDUCTION SURGERY    . CAROTID ANGIOGRAM N/A 02/25/2013   Procedure: CAROTID ANGIOGRAM;  Surgeon: Lorretta Harp, MD;  Location: [facility] CATH LAB;  [place]: Cardiovascular;  Laterality: N/A;  . ENDARTERECTOMY Right 03/06/2013   Procedure: ENDARTERECTOMY CAROTID-RIGHT;  Surgeon: Serafina Mitchell, MD;  Location: [place];  [place]: Vascular;  Laterality: Right;  . ENDARTERECTOMY Left 05/07/2013   Procedure: LEFT CAROTID ARTERY ENDARTERECTOMY WITH VASCU-GUARD PATCH ANGIOPLASTY ;  Surgeon: Serafina Mitchell, MD;  Location: [place];  [place]: Vascular;  Laterality: Left;  . LOBECTOMY Left 02/03/2016   Procedure: LEFT UPPER LOBECTOMY;  Surgeon: [name], MD;  Location: [place];  [place]: Thoracic;  Laterality: Left;  . LOWER EXTREMITY ANGIOGRAM N/A 02/25/2013   Procedure: LOWER EXTREMITY ANGIOGRAM;  Surgeon: Lorretta Harp, MD;  Location: [facility] CATH LAB;  [place]: Cardiovascular;  Laterality: N/A;  . LOWER EXTREMITY ANGIOGRAM N/A 07/27/2013   Procedure: LOWER EXTREMITY ANGIOGRAM;  Surgeon: Lorretta Harp, MD;  [facility] LAB;  [place]: Cardiovascular;  Laterality: N/A;  . PATCH ANGIOPLASTY Right 03/06/2013   Procedure: PATCH ANGIOPLASTY of Right Carotid Artery using Vascu-Guard Patch;  Surgeon: Serafina Mitchell, MD;  Location: [place];  [place]: Vascular;  Laterality: Right;  . PERIPHERAL VASCULAR CATHETERIZATION N/A 11/15/2015   Procedure: Aortic Arch Angiography;  Surgeon: Serafina Mitchell, MD;  Location: [place] CV LAB;  [place]: Cardiovascular;  Laterality: N/A;  . PERIPHERAL VASCULAR CATHETERIZATION Bilateral 11/15/2015   Procedure: Carotid Angiography;  Surgeon: Serafina Mitchell, MD;  Location: [place] CV LAB;  [place]: Cardiovascular;  Laterality: Bilateral;  . PERIPHERAL VASCULAR CATHETERIZATION Left 11/15/2015   Procedure: Upper Extremity Angiography;  Surgeon: Serafina Mitchell, MD;  Location: [place] CV LAB;  [place]: Cardiovascular;  Laterality: Left;  . PERIPHERAL VASCULAR CATHETERIZATION Left 11/15/2015   Procedure: Peripheral Vascular Intervention;  Surgeon: Serafina Mitchell, MD;  Location: [place] CV LAB;  [place]: Cardiovascular;  Laterality: Left;  subclavian   . SALIVARY GLAND SURGERY     scar tissue removed from left saliva glad  .  TUBAL LIGATION    . VIDEO ASSISTED THORACOSCOPY (VATS)/WEDGE RESECTION Left 02/03/2016   Procedure: VIDEO ASSISTED THORACOSCOPY;  Surgeon: [name], MD;  Location: [place];  [place]: Thoracic;  Laterality: Left;    There were no vitals filed for this visit.      Subjective Assessment - 04/02/16 1136    Pain Score 7    Pain Location Flank   Pain Orientation Left   Pain Descriptors / Indicators  Dull;Sharp   Pain Type Surgical pain   Pain Onset More than a month ago                         [facility] Adult PT Treatment/Exercise - 04/02/16 0001      Modalities   Modalities Electrical Stimulation     Electrical Stimulation   Electrical Stimulation Location Lt thoracic, rib cage   Electrical Stimulation Action 20 minutes pre mod   Electrical Stimulation Parameters to tolerance   Electrical Stimulation Goals Pain     Manual Therapy   Manual Therapy Soft tissue mobilization   Soft tissue mobilization gentle superficial soft tissue mobilization for pain control Lt flank from breast to thoracic sp                PT Education - 04/02/16 1138    Education Details home TENS   Person(s) Educated Patient   Methods Explanation;Handout   Comprehension Verbalized understanding             PT Long Term Goals - 04/02/16 1138      PT LONG TERM GOAL #1   Title Pt will report pain <= 4/10 with daily activities   Time 4   Period Weeks   Status New     PT LONG TERM GOAL #2   Title Pt will tolerate sleeping in bed x 6 hours without increase in pain   Time 4   Period Weeks   Status New          Lung Clinic Goals - 03/15/16 1458      Patient will be able to verbalize understanding of the benefit of exercise to decrease fatigue.   Status Achieved     Patient will be able to verbalize the importance of posture.   Status Achieved     Patient will be able to demonstrate diaphragmatic breathing for improved lung function.   Status Achieved     Patient will be able to verbalize understanding of the role of physical therapy to prevent functional decline and who to contact if physical therapy is needed.   Status Achieved             Plan - 04/02/16 1137    Clinical Impression Statement Pt feeling much better after e stim last week. Able to tolerate soft tissue work. Pt given handout on home TENS unit   Rehab Potential Good   PT Frequency 2x /  week   PT Duration 4 weeks   PT Treatment/Interventions ADLs/Self Care Home Management;Electrical Stimulation;Moist Heat;Cryotherapy;Therapeutic exercise;Patient/family education;Manual techniques;Scar mobilization;Passive range of motion;Taping   PT Next Visit Plan continue estim, STW   Consulted and Agree with Plan of Care Patient      Patient will benefit from skilled therapeutic intervention in order to improve the following deficits and impairments:  Pain, Decreased range of motion  Visit Diagnosis: Aftercare following surgery for neoplasm  Other symptoms and signs involving the musculoskeletal system     Problem List Patient  Active Problem List   Diagnosis Date Noted  . Cancer of left lung ([place]) 02/03/2016  . COPD GOLD II 12/08/2015  . Solitary pulmonary nodule 12/08/2015  . Bilateral occipital neuralgia 11/22/2015  . Double vision 10/28/2015  . TIA (transient ischemic attack) 10/28/2015  . Subclavian artery stenosis, left ([place]) 10/28/2015  . Subclavian steal syndrome 10/28/2015  . Acute blood loss anemia 10/20/2013  . C. difficile colitis 10/19/2013  . GI bleed 10/18/2013  . COPD 10/18/2013  . Bleeding gastrointestinal 10/18/2013  . Hypoxia 10/10/2013  . Bronchitis 10/08/2013  . PVD (peripheral vascular disease) with claudication ([place]) 07/29/2013  . Carotid stenosis 05/07/2013  . Carotid artery narrowing 05/07/2013  . Occlusion and stenosis of carotid artery without mention of cerebral infarction 03/02/2013  . Carotid artery obstruction 03/02/2013  . Carotid artery disease ([place]) 02/27/2013  . Tobacco abuse 02/27/2013  . Compulsive tobacco user syndrome 02/27/2013  . Claudication ([place]) 01/20/2013  . Essential hypertension 01/20/2013  . Type 2 diabetes mellitus ([place]) 01/20/2013  . Hyperlipidemia 01/20/2013   Isabelle Course, PT, DPT 04/02/2016, 11:40 AM  [facility] [address] [place], [place], [address] Phone:  [phone]   Fax:  [phone]  Name: Caitlin Oliver MRN: [id number] Date of Birth: 03/31/1941

## 2016-04-04 ENCOUNTER — Encounter: Payer: Self-pay | Admitting: Physical Therapy

## 2016-04-04 ENCOUNTER — Ambulatory Visit: Payer: PPO | Admitting: Physical Therapy

## 2016-04-04 DIAGNOSIS — Z483 Aftercare following surgery for neoplasm: Secondary | ICD-10-CM | POA: Diagnosis not present

## 2016-04-04 DIAGNOSIS — R29898 Other symptoms and signs involving the musculoskeletal system: Secondary | ICD-10-CM

## 2016-04-04 NOTE — Therapy (Signed)
[facility] [place], [place], [address] Phone: [phone]   Fax:  [phone]  Physical Therapy Treatment  Patient Details  Name: Caitlin Oliver MRN: [id number] Date of Birth: 02/25/1941 Referring Provider: Dr. Curt Bears  Encounter Date: 04/04/2016      PT End of Session - 04/04/16 1112    Visit Number 4   Number of Visits 9   Date for PT Re-Evaluation 04/30/16   PT Start Time 1118   PT Stop Time 1201   PT Time Calculation (min) 43 min   Activity Tolerance Patient tolerated treatment well   Behavior During Therapy [facility] for tasks assessed/performed      Past Medical History:  Diagnosis Date  . Anxiety   . Carotid artery disease ([place])   . Claudication ([place])   . COPD (chronic obstructive pulmonary disease) ([place])   . Diabetes mellitus   . Dizzy   . GERD (gastroesophageal reflux disease)   . Headache   . History of kidney stones   . Hypercholesteremia   . Hypertension   . 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 Shortness of breath    with exertion  . Stomach ulcer   . Tobacco abuse     Past Surgical History:  Procedure Laterality Date  . ABDOMINAL HYSTERECTOMY    . APPENDECTOMY    . BREAST REDUCTION SURGERY    . CAROTID ANGIOGRAM N/A 02/25/2013   Procedure: CAROTID ANGIOGRAM;  Surgeon: Lorretta Harp, MD;  Location[facility] CATH LAB;  Service: Cardiovascular;  Laterality: N/A;  . ENDARTERECTOMY Right 03/06/2013   Procedure: ENDARTERECTOMY CAROTID-RIGHT;  Surgeon: Serafina Mitchell, MD;  Location: [place];  Service: Vascular;  Laterality: Right;  . ENDARTERECTOMY Left 05/07/2013   Procedure: LEFT CAROTID ARTERY ENDARTERECTOMY WITH VASCU-GUARD PATCH ANGIOPLASTY ;  Surgeon: Serafina Mitchell, MD;  Location: [place];  Service: Vascular;  Laterality: Left;  . LOBECTOMY Left 02/03/2016   Procedure: LEFT UPPER LOBECTOMY;  Surgeon: [name], MD;  Location: [place];  Service: Thoracic;  Laterality: Left;  . LOWER EXTREMITY ANGIOGRAM N/A 02/25/2013   Procedure: LOWER EXTREMITY ANGIOGRAM;  Surgeon: Lorretta Harp, MD;  Location: [facility] CATH LAB;  Service: Cardiovascular;  Laterality: N/A;  . LOWER EXTREMITY ANGIOGRAM N/A 07/27/2013   Procedure: LOWER EXTREMITY ANGIOGRAM;  Surgeon: Lorretta Harp, MD;  Location: [facility] CATH LAB;  Service: Cardiovascular;  Laterality: N/A;  . PATCH ANGIOPLASTY Right 03/06/2013   Procedure: PATCH ANGIOPLASTY of Right Carotid Artery using Vascu-Guard Patch;  Surgeon: Serafina Mitchell, MD;  Location: [place];  Service: Vascular;  Laterality: Right;  . PERIPHERAL VASCULAR CATHETERIZATION N/A 11/15/2015   Procedure: Aortic Arch Angiography;  Surgeon: Serafina Mitchell, MD;  Location: [place] CV LAB;  Service: Cardiovascular;  Laterality: N/A;  . PERIPHERAL VASCULAR CATHETERIZATION Bilateral 11/15/2015   Procedure: Carotid Angiography;  Surgeon: Serafina Mitchell, MD;  Location: [place] CV LAB;  Service: Cardiovascular;  Laterality: Bilateral;  . PERIPHERAL VASCULAR CATHETERIZATION Left 11/15/2015   Procedure: Upper Extremity Angiography;  Surgeon: Serafina Mitchell, MD;  Location: [place] CV LAB;  Service: Cardiovascular;  Laterality: Left;  . PERIPHERAL VASCULAR CATHETERIZATION Left 11/15/2015   Procedure: Peripheral Vascular Intervention;  Surgeon: Serafina Mitchell, MD;  Location: [place] CV LAB;  Service: Cardiovascular;  Laterality: Left;  subclavian   . SALIVARY GLAND SURGERY     scar tissue removed from left saliva glad  .  TUBAL LIGATION    . VIDEO ASSISTED THORACOSCOPY (VATS)/WEDGE RESECTION Left 02/03/2016   Procedure: VIDEO ASSISTED THORACOSCOPY;  Surgeon: [name], MD;  Location: [place];  Service: Thoracic;  Laterality: Left;    There were no vitals filed for this visit.      Subjective Assessment - 04/04/16 1112    Subjective Reports some tenderness and discomfort from L lateral rib area and into her  posterior ribs. Reports greatest pain right under L breast. Reports that she cleaned on her car and she had increased pain after that.   Pertinent History Pt. presented with dizziness and nausea; work up resulted in incidental finding of a lung nodule.  Pt. had left upper lobe wedge resection 02/03/16 by VATS for diagnosis of stage Ia squamous cell carcinoma.  She will be followed without further cancer treatment at the moment.  Pt. quit smoking in 2015 after 84 pack-years.  DM, HTN.   Patient Stated Goals get info from all lung clinic providers   Currently in Pain? Yes   Pain Score 9    Pain Location Flank   Pain Orientation Left   Pain Descriptors / Indicators Sharp;Dull  Sharp intermittantly and dull otherwise   Pain Type Surgical pain   Pain Onset More than a month ago   Pain Frequency Intermittent   Aggravating Factors  movement   Pain Relieving Factors Sitting and resting            [facility] PT Assessment - 04/04/16 0001      Assessment   Medical Diagnosis left upper lobe squamous cell carcinoma, s/p VATS lobectomy, stage I   Onset Date/Surgical Date 02/03/16   Prior Therapy none     Precautions   Precautions Other (comment)   Precaution Comments cancer precautions     Restrictions   Weight Bearing Restrictions No                     [facility] Adult PT Treatment/Exercise - 04/04/16 0001      Modalities   Modalities Electrical Stimulation     Electrical Stimulation   Electrical Stimulation Location L lateral ribcage   Electrical Stimulation Action Pre-Mod   Electrical Stimulation Parameters 80-150 hz x15 min   Electrical Stimulation Goals Pain     Manual Therapy   Manual Therapy Soft tissue mobilization   Soft tissue mobilization Gentle STW to L flank region from inferior to L breast to posterior L ribcage.                     PT Long Term Goals - 04/02/16 1138      PT LONG TERM GOAL #1   Title Pt will report pain <= 4/10 with daily  activities   Time 4   Period Weeks   Status New     PT LONG TERM GOAL #2   Title Pt will tolerate sleeping in bed x 6 hours without increase in pain   Time 4   Period Weeks   Status New          Lung Clinic Goals - 03/15/16 1458      Patient will be able to verbalize understanding of the benefit of exercise to decrease fatigue.   Status Achieved     Patient will be able to verbalize the importance of posture.   Status Achieved     Patient will be able to demonstrate diaphragmatic breathing for improved lung function.   Status Achieved  Patient will be able to verbalize understanding of the role of physical therapy to prevent functional decline and who to contact if physical therapy is needed.   Status Achieved             Plan - 04/04/16 1156    Clinical Impression Statement Patient arrived to treatment with reports of greater pain inferior to R breast. Gentle STW completed to posterior L ribs and into lateral L ribs. Patient experienced greater discomfort with STW in L flank region directly inferior to L armpit in mid ribcage region. Normal modality response noted following removal of the electrical stimulation. Upon sitting following end of modalities patient reported tenderness under L breast but no pain as prior to treatment.    Rehab Potential Good   PT Frequency 2x / week   PT Duration 4 weeks   PT Treatment/Interventions ADLs/Self Care Home Management;Electrical Stimulation;Moist Heat;Cryotherapy;Therapeutic exercise;Patient/family education;Manual techniques;Scar mobilization;Passive range of motion;Taping   PT Next Visit Plan continue estim, STW   PT Home Exercise Plan walking, breathing exercise   Consulted and Agree with Plan of Care Patient      Patient will benefit from skilled therapeutic intervention in order to improve the following deficits and impairments:  Pain, Decreased range of motion  Visit Diagnosis: Aftercare following surgery for  neoplasm  Other symptoms and signs involving the musculoskeletal system     Problem List Patient Active Problem List   Diagnosis Date Noted  . Cancer of left lung ([place]) 02/03/2016  . COPD GOLD II 12/08/2015  . Solitary pulmonary nodule 12/08/2015  . Bilateral occipital neuralgia 11/22/2015  . Double vision 10/28/2015  . TIA (transient ischemic attack) 10/28/2015  . Subclavian artery stenosis, left ([place]) 10/28/2015  . Subclavian steal syndrome 10/28/2015  . Acute blood loss anemia 10/20/2013  . C. difficile colitis 10/19/2013  . GI bleed 10/18/2013  . COPD 10/18/2013  . Bleeding gastrointestinal 10/18/2013  . Hypoxia 10/10/2013  . Bronchitis 10/08/2013  . PVD (peripheral vascular disease) with claudication ([place]) 07/29/2013  . Carotid stenosis 05/07/2013  . Carotid artery narrowing 05/07/2013  . Occlusion and stenosis of carotid artery without mention of cerebral infarction 03/02/2013  . Carotid artery obstruction 03/02/2013  . Carotid artery disease ([place]) 02/27/2013  . Tobacco abuse 02/27/2013  . Compulsive tobacco user syndrome 02/27/2013  . Claudication ([place]) 01/20/2013  . Essential hypertension 01/20/2013  . Type 2 diabetes mellitus ([place]) 01/20/2013  . Hyperlipidemia 01/20/2013    Wynelle Fanny, PTA 04/04/2016, 12:10 PM  [facility] [address] [place], [place], [address] Phone: [phone]   Fax:  [phone]  Name: Caitlin Oliver MRN: [id number] Date of Birth: 29-Jun-1940

## 2016-04-11 ENCOUNTER — Other Ambulatory Visit: Payer: Self-pay | Admitting: *Deleted

## 2016-04-11 ENCOUNTER — Ambulatory Visit: Payer: PPO | Admitting: *Deleted

## 2016-04-11 DIAGNOSIS — R29898 Other symptoms and signs involving the musculoskeletal system: Secondary | ICD-10-CM

## 2016-04-11 DIAGNOSIS — Z483 Aftercare following surgery for neoplasm: Secondary | ICD-10-CM

## 2016-04-11 NOTE — Patient Outreach (Signed)
Error, this note not intended for this pt.

## 2016-04-11 NOTE — Therapy (Signed)
[facility] [place], [place], [address] Phone: [phone]   Fax:  [phone]  Physical Therapy Treatment  Patient Details  Name: Caitlin Oliver MRN: [id number] Date of Birth: 1941/02/01 Referring Provider: Dr. Curt Bears  Encounter Date: 04/11/2016      PT End of Session - 04/11/16 1519    Visit Number 5   Date for PT Re-Evaluation 04/30/16   PT Start Time 3664   PT Stop Time 1601   PT Time Calculation (min) 46 min      Past Medical History:  Diagnosis Date  . Anxiety   . Carotid artery disease ([place])   . Claudication ([place])   . COPD (chronic obstructive pulmonary disease) ([place])   . Diabetes mellitus   . Dizzy   . GERD (gastroesophageal reflux disease)   . Headache   . History of kidney stones   . Hypercholesteremia   . Hypertension   . 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 Shortness of breath    with exertion  . Stomach ulcer   . Tobacco abuse     Past Surgical History:  Procedure Laterality Date  . ABDOMINAL HYSTERECTOMY    . APPENDECTOMY    . BREAST REDUCTION SURGERY    . CAROTID ANGIOGRAM N/A 02/25/2013   Procedure: CAROTID ANGIOGRAM;  Surgeon: Lorretta Harp, MD;  Location: [facility] CATH LAB;  Service: Cardiovascular;  Laterality: N/A;  . ENDARTERECTOMY Right 03/06/2013   Procedure: ENDARTERECTOMY CAROTID-RIGHT;  Surgeon: Serafina Mitchell, MD;  Location: [place];  Service: Vascular;  Laterality: Right;  . ENDARTERECTOMY Left 05/07/2013   Procedure: LEFT CAROTID ARTERY ENDARTERECTOMY WITH VASCU-GUARD PATCH ANGIOPLASTY ;  Surgeon: Serafina Mitchell, MD;  Location: [place];  Service: Vascular;  Laterality: Left;  . LOBECTOMY Left 02/03/2016   Procedure: LEFT UPPER LOBECTOMY;  Surgeon: [name], MD;  Location: [place];  Service: Thoracic;  Laterality: Left;  . LOWER EXTREMITY ANGIOGRAM N/A 02/25/2013   Procedure: LOWER EXTREMITY ANGIOGRAM;  Surgeon:  Lorretta Harp, MD;  Location: [facility] CATH LAB;  Service: Cardiovascular;  Laterality: N/A;  . LOWER EXTREMITY ANGIOGRAM N/A 07/27/2013   Procedure: LOWER EXTREMITY ANGIOGRAM;  Surgeon: Lorretta Harp, MD;  Location: [facility] CATH LAB;  Service: Cardiovascular;  Laterality: N/A;  . PATCH ANGIOPLASTY Right 03/06/2013   Procedure: PATCH ANGIOPLASTY of Right Carotid Artery using Vascu-Guard Patch;  Surgeon: Serafina Mitchell, MD;  Location: [place];  Service: Vascular;  Laterality: Right;  . PERIPHERAL VASCULAR CATHETERIZATION N/A 11/15/2015   Procedure: Aortic Arch Angiography;  Surgeon: Serafina Mitchell, MD;  Location: [place] CV LAB;  Service: Cardiovascular;  Laterality: N/A;  . PERIPHERAL VASCULAR CATHETERIZATION Bilateral 11/15/2015   Procedure: Carotid Angiography;  Surgeon: Serafina Mitchell, MD;  Location: [place] CV LAB;  Service: Cardiovascular;  Laterality: Bilateral;  . PERIPHERAL VASCULAR CATHETERIZATION Left 11/15/2015   Procedure: Upper Extremity Angiography;  Surgeon: Serafina Mitchell, MD;  Location: [place] CV LAB;  Service: Cardiovascular;  Laterality: Left;  . PERIPHERAL VASCULAR CATHETERIZATION Left 11/15/2015   Procedure: Peripheral Vascular Intervention;  Surgeon: Serafina Mitchell, MD;  Location: [place] CV LAB;  Service: Cardiovascular;  Laterality: Left;  subclavian   . SALIVARY GLAND SURGERY     scar tissue removed from left saliva glad  . TUBAL LIGATION    . VIDEO ASSISTED THORACOSCOPY (VATS)/WEDGE RESECTION Left 02/03/2016   Procedure: VIDEO ASSISTED THORACOSCOPY;  Surgeon: Remo Lipps  Chaya Jan, MD;  Location: [place];  Service: Thoracic;  Laterality: Left;    There were no vitals filed for this visit.      Subjective Assessment - 04/11/16 1448    Subjective Reports some tenderness and discomfort from L lateral rib area and into her posterior ribs. Reports greatest pain right under L breast. Reports that she cleaned on her car and she had increased pain after that.    Pertinent History Pt. presented with dizziness and nausea; work up resulted in incidental finding of a lung nodule.  Pt. had left upper lobe wedge resection 02/03/16 by VATS for diagnosis of stage Ia squamous cell carcinoma.  She will be followed without further cancer treatment at the moment.  Pt. quit smoking in 2015 after 84 pack-years.  DM, HTN.[name]     Did well after lastRX pain 5/10   Patient Stated Goals get info from all lung clinic providers   Pain Score 5    Pain Orientation Left   Pain Type Surgical pain   Pain Onset More than a month ago   Pain Frequency Intermittent                         [facility] Adult PT Treatment/Exercise - 04/11/16 0001      Modalities   Modalities Electrical Stimulation     Electrical Stimulation   Electrical Stimulation Location Left thoracic/rib region. IFC x 20 mins 80-150hz    Electrical Stimulation Goals Pain     Manual Therapy   Manual Therapy Soft tissue mobilization   Soft tissue mobilization Gentle STW/ myofascial work to L flank region from inferior to L breast to posterior L ribcage.                     PT Long Term Goals - 04/02/16 1138      PT LONG TERM GOAL #1   Title Pt will report pain <= 4/10 with daily activities   Time 4   Period Weeks   Status New     PT LONG TERM GOAL #2   Title Pt will tolerate sleeping in bed x 6 hours without increase in pain   Time 4   Period Weeks   Status New          Lung Clinic Goals - 03/15/16 1458      Patient will be able to verbalize understanding of the benefit of exercise to decrease fatigue.   Status Achieved     Patient will be able to verbalize the importance of posture.   Status Achieved     Patient will be able to demonstrate diaphragmatic breathing for improved lung function.   Status Achieved     Patient will be able to verbalize understanding of the role of physical therapy to prevent functional decline and who to contact if physical therapy is  needed.   Status Achieved             Plan - 04/11/16 1517    Clinical Impression Statement Pt arrived to clinic today with decreased pain in LT ribcage area since last Rx. Today's pain was rated 5/10 compared to 9/10 last week. She is still very sore and tender around incisions with posterior incision being the most tender during STW    Rehab Potential Good   PT Frequency 2x / week   PT Duration 4 weeks   PT Treatment/Interventions ADLs/Self Care Home Management;Electrical Stimulation;Moist Heat;Cryotherapy;Therapeutic exercise;Patient/family education;Manual  techniques;Scar mobilization;Passive range of motion;Taping   PT Next Visit Plan continue estim, STW   PT Home Exercise Plan walking, breathing exercise   Consulted and Agree with Plan of Care Patient      Patient will benefit from skilled therapeutic intervention in order to improve the following deficits and impairments:  Pain, Decreased range of motion  Visit Diagnosis: Aftercare following surgery for neoplasm  Other symptoms and signs involving the musculoskeletal system     Problem List Patient Active Problem List   Diagnosis Date Noted  . Cancer of left lung ([place]) 02/03/2016  . COPD GOLD II 12/08/2015  . Solitary pulmonary nodule 12/08/2015  . Bilateral occipital neuralgia 11/22/2015  . Double vision 10/28/2015  . TIA (transient ischemic attack) 10/28/2015  . Subclavian artery stenosis, left ([place]) 10/28/2015  . Subclavian steal syndrome 10/28/2015  . Acute blood loss anemia 10/20/2013  . C. difficile colitis 10/19/2013  . GI bleed 10/18/2013  . COPD 10/18/2013  . Bleeding gastrointestinal 10/18/2013  . Hypoxia 10/10/2013  . Bronchitis 10/08/2013  . PVD (peripheral vascular disease) with claudication ([place]) 07/29/2013  . Carotid stenosis 05/07/2013  . Carotid artery narrowing 05/07/2013  . Occlusion and stenosis of carotid artery without mention of cerebral infarction 03/02/2013  . Carotid artery  obstruction 03/02/2013  . Carotid artery disease ([place]) 02/27/2013  . Tobacco abuse 02/27/2013  . Compulsive tobacco user syndrome 02/27/2013  . Claudication ([place]) 01/20/2013  . Essential hypertension 01/20/2013  . Type 2 diabetes mellitus ([place]) 01/20/2013  . Hyperlipidemia 01/20/2013    Shatira Dobosz,CHRIS, PTA 04/11/2016, 5:16 PM  [facility] [place], [place], [address] Phone: [phone]   Fax:  [phone]  Name: Caitlin Oliver MRN: [id number] Date of Birth: [birth year]

## 2016-04-11 NOTE — Patient Outreach (Signed)
Transition of care call, extended additional 30 days for complex patient. Caitlin Oliver says she is doing well. She has been receiving outpt electrical stimulation for her pain control which she says is helping. She admits to not using her spirometer as her breathing is back to baseline for her.She also reports she has never received the Advanced Directives information and documents I requested to be sent to her.  I have encouraged her to resume use of the spirometer as this can only help her gain some increased lung capacity. I have also told pt that I will personally deliver the Advanced Directives documents my self on Friday.  [name] [facility] [place] [phone]

## 2016-04-13 ENCOUNTER — Ambulatory Visit: Payer: PPO | Admitting: Physical Therapy

## 2016-04-13 ENCOUNTER — Other Ambulatory Visit: Payer: Self-pay | Admitting: *Deleted

## 2016-04-13 ENCOUNTER — Encounter: Payer: Self-pay | Admitting: Physical Therapy

## 2016-04-13 DIAGNOSIS — Z483 Aftercare following surgery for neoplasm: Secondary | ICD-10-CM | POA: Diagnosis not present

## 2016-04-13 DIAGNOSIS — R29898 Other symptoms and signs involving the musculoskeletal system: Secondary | ICD-10-CM

## 2016-04-13 NOTE — Therapy (Signed)
[facility] [place], [place], [address] Phone: [phone]   Fax:  [phone]  Physical Therapy Treatment  Patient Details  Name: Caitlin Oliver MRN: [id number] Date of Birth: 1940/12/04 Referring Provider: Dr. Curt Bears  Encounter Date: 04/13/2016      PT End of Session - 04/13/16 1111    Visit Number 6   Number of Visits 9   Date for PT Re-Evaluation 04/30/16   PT Start Time 1111   PT Stop Time 1151   PT Time Calculation (min) 40 min   Activity Tolerance Patient tolerated treatment well   Behavior During Therapy [facility] for tasks assessed/performed      Past Medical History:  Diagnosis Date  . Anxiety   . Carotid artery disease ([place])   . Claudication ([place])   . COPD (chronic obstructive pulmonary disease) ([place])   . Diabetes mellitus   . Dizzy   . GERD (gastroesophageal reflux disease)   . Headache   . History of kidney stones   . Hypercholesteremia   . Hypertension   . 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 Shortness of breath    with exertion  . Stomach ulcer   . Tobacco abuse     Past Surgical History:  Procedure Laterality Date  . ABDOMINAL HYSTERECTOMY    . APPENDECTOMY    . BREAST REDUCTION SURGERY    . CAROTID ANGIOGRAM N/A 02/25/2013   Procedure: CAROTID ANGIOGRAM;  Surgeon: Lorretta Harp, MD;  Location: [facility] CATH LAB;  Service: Cardiovascular;  Laterality: N/A;  . ENDARTERECTOMY Right 03/06/2013   Procedure: ENDARTERECTOMY CAROTID-RIGHT;  Surgeon: Serafina Mitchell, MD;  Location: [place];  Service: Vascular;  Laterality: Right;  . ENDARTERECTOMY Left 05/07/2013   Procedure: LEFT CAROTID ARTERY ENDARTERECTOMY WITH VASCU-GUARD PATCH ANGIOPLASTY ;  Surgeon: Serafina Mitchell, MD;  Location: [place];  Service: Vascular;  Laterality: Left;  . LOBECTOMY Left 02/03/2016   Procedure: LEFT UPPER LOBECTOMY;  Surgeon: [name], MD;  Location: [place];  Service: Thoracic;  Laterality: Left;  . LOWER EXTREMITY ANGIOGRAM N/A 02/25/2013   Procedure: LOWER EXTREMITY ANGIOGRAM;  Surgeon: Lorretta Harp, MD;  Location: [facility] CATH LAB;  Service: Cardiovascular;  Laterality: N/A;  . LOWER EXTREMITY ANGIOGRAM N/A 07/27/2013   Procedure: LOWER EXTREMITY ANGIOGRAM;  Surgeon: Lorretta Harp, MD;  Location: [facility] CATH LAB;  Service: Cardiovascular;  Laterality: N/A;  . PATCH ANGIOPLASTY Right 03/06/2013   Procedure: PATCH ANGIOPLASTY of Right Carotid Artery using Vascu-Guard Patch;  Surgeon: Serafina Mitchell, MD;  Location: [place];  Service: Vascular;  Laterality: Right;  . PERIPHERAL VASCULAR CATHETERIZATION N/A 11/15/2015   Procedure: Aortic Arch Angiography;  Surgeon: Serafina Mitchell, MD;  Location: [place] CV LAB;  Service: Cardiovascular;  Laterality: N/A;  . PERIPHERAL VASCULAR CATHETERIZATION Bilateral 11/15/2015   Procedure: Carotid Angiography;  Surgeon: Serafina Mitchell, MD;  Location: [place] CV LAB;  Service: Cardiovascular;  Laterality: Bilateral;  . PERIPHERAL VASCULAR CATHETERIZATION Left 11/15/2015   Procedure: Upper Extremity Angiography;  Surgeon: Serafina Mitchell, MD;  Location: [place] CV LAB;  Service: Cardiovascular;  Laterality: Left;  . PERIPHERAL VASCULAR CATHETERIZATION Left 11/15/2015   Procedure: Peripheral Vascular Intervention;  Surgeon: Serafina Mitchell, MD;  Location: [place] CV LAB;  Service: Cardiovascular;  Laterality: Left;  subclavian   . SALIVARY GLAND SURGERY     scar tissue removed from left saliva glad  .  TUBAL LIGATION    . VIDEO ASSISTED THORACOSCOPY (VATS)/WEDGE RESECTION Left 02/03/2016   Procedure: VIDEO ASSISTED THORACOSCOPY;  Surgeon: [name], MD;  Location: [place];  Service: Thoracic;  Laterality: Left;    There were no vitals filed for this visit.      Subjective Assessment - 04/13/16 1110    Subjective Reports wearing compression bandage around ribcage and had more pain after removal  of bandage. Patient reports having a cough recently. Reports "swimmy headed" symptoms especially when lying down recently and pain in R flank area.   Pertinent History Pt. presented with dizziness and nausea; work up resulted in incidental finding of a lung nodule.  Pt. had left upper lobe wedge resection 02/03/16 by VATS for diagnosis of stage Ia squamous cell carcinoma.  She will be followed without further cancer treatment at the moment.  Pt. quit smoking in 2015 after 84 pack-years.  DM, HTN.[name]     Did well after lastRX pain 5/10   Patient Stated Goals get info from all lung clinic providers   Currently in Pain? Yes   Pain Score 4    Pain Location Flank   Pain Orientation Left   Pain Descriptors / Indicators Sharp;Dull  Sharp pain when pain presents but at rest pain is dull.   Pain Type Surgical pain   Pain Onset More than a month ago   Pain Frequency Intermittent            [facility] PT Assessment - 04/13/16 0001      Assessment   Medical Diagnosis left upper lobe squamous cell carcinoma, s/p VATS lobectomy, stage I   Onset Date/Surgical Date 02/03/16   Prior Therapy none     Precautions   Precautions Other (comment)   Precaution Comments cancer precautions     Restrictions   Weight Bearing Restrictions No                     [facility] Adult PT Treatment/Exercise - 04/13/16 0001      Modalities   Modalities Electrical Stimulation     Electrical Stimulation   Electrical Stimulation Location L lateral thoracic/ rib cage   Electrical Stimulation Action IFC   Electrical Stimulation Parameters 80-150 hz x15 min   Electrical Stimulation Goals Pain     Manual Therapy   Manual Therapy Soft tissue mobilization   Soft tissue mobilization Gentle STW/ myofascial work to L flank region from inferior to L breast to posterior L ribcage.                     PT Long Term Goals - 04/02/16 1138      PT LONG TERM GOAL #1   Title Pt will report pain <= 4/10 with  daily activities   Time 4   Period Weeks   Status New     PT LONG TERM GOAL #2   Title Pt will tolerate sleeping in bed x 6 hours without increase in pain   Time 4   Period Weeks   Status New          Lung Clinic Goals - 03/15/16 1458      Patient will be able to verbalize understanding of the benefit of exercise to decrease fatigue.   Status Achieved     Patient will be able to verbalize the importance of posture.   Status Achieved     Patient will be able to demonstrate diaphragmatic breathing for improved lung function.  Status Achieved     Patient will be able to verbalize understanding of the role of physical therapy to prevent functional decline and who to contact if physical therapy is needed.   Status Achieved             Plan - 04/13/16 1137    Clinical Impression Statement Patient arrived today with compression bandage around mid ribcage and after removal of the bandage pain increased. Good incision mobility with only very minimal increased tightness noted in L ribcage musculature. Patient experienced random increase in pain during gentle STW in which patient held herself under L breast. Patient experienced the greatest sensitivity to STW to area around anterior most incision in L flank.  Normal modalities response noted following removal of the modalities.   Rehab Potential Good   PT Frequency 2x / week   PT Duration 4 weeks   PT Treatment/Interventions ADLs/Self Care Home Management;Electrical Stimulation;Moist Heat;Cryotherapy;Therapeutic exercise;Patient/family education;Manual techniques;Scar mobilization;Passive range of motion;Taping   PT Next Visit Plan continue estim, STW   PT Home Exercise Plan walking, breathing exercise   Consulted and Agree with Plan of Care Patient      Patient will benefit from skilled therapeutic intervention in order to improve the following deficits and impairments:  Pain, Decreased range of motion  Visit  Diagnosis: Aftercare following surgery for neoplasm  Other symptoms and signs involving the musculoskeletal system     Problem List Patient Active Problem List   Diagnosis Date Noted  . Cancer of left lung ([place]) 02/03/2016  . COPD GOLD II 12/08/2015  . Solitary pulmonary nodule 12/08/2015  . Bilateral occipital neuralgia 11/22/2015  . Double vision 10/28/2015  . TIA (transient ischemic attack) 10/28/2015  . Subclavian artery stenosis, left ([place]) 10/28/2015  . Subclavian steal syndrome 10/28/2015  . Acute blood loss anemia 10/20/2013  . C. difficile colitis 10/19/2013  . GI bleed 10/18/2013  . COPD 10/18/2013  . Bleeding gastrointestinal 10/18/2013  . Hypoxia 10/10/2013  . Bronchitis 10/08/2013  . PVD (peripheral vascular disease) with claudication ([place]) 07/29/2013  . Carotid stenosis 05/07/2013  . Carotid artery narrowing 05/07/2013  . Occlusion and stenosis of carotid artery without mention of cerebral infarction 03/02/2013  . Carotid artery obstruction 03/02/2013  . Carotid artery disease ([place]) 02/27/2013  . Tobacco abuse 02/27/2013  . Compulsive tobacco user syndrome 02/27/2013  . Claudication ([place]) 01/20/2013  . Essential hypertension 01/20/2013  . Type 2 diabetes mellitus ([place]) 01/20/2013  . Hyperlipidemia 01/20/2013    Wynelle Fanny, PTA 04/13/2016, 11:56 AM  [facility] [address] [place], [place], [address] Phone: [phone]   Fax:  [phone]  Name: Caitlin Oliver MRN: [id number] Date of Birth: 1940/06/25

## 2016-04-13 NOTE — Patient Outreach (Signed)
[facility] [place] [facility]) Care Management  04/13/2016  IVELISE CASTILLO 1940-06-28 [id number]  Brief home visit to deliver [place], MOST and Advanced Directives documents delivered and explained to Mrs. Elberta Fortis. I have asked her to complete these, copy them and make sure she gives a copy to her primary care provider.  I will be calling her for her last transition of care call in 2 weeks.  [name] [facility] [place] [phone]

## 2016-04-17 ENCOUNTER — Ambulatory Visit: Payer: PPO | Admitting: Physical Therapy

## 2016-04-19 ENCOUNTER — Encounter: Payer: Self-pay | Admitting: Physical Therapy

## 2016-04-19 ENCOUNTER — Ambulatory Visit: Payer: PPO | Attending: Internal Medicine | Admitting: Physical Therapy

## 2016-04-19 DIAGNOSIS — R29898 Other symptoms and signs involving the musculoskeletal system: Secondary | ICD-10-CM

## 2016-04-19 DIAGNOSIS — Z483 Aftercare following surgery for neoplasm: Secondary | ICD-10-CM | POA: Insufficient documentation

## 2016-04-19 NOTE — Therapy (Signed)
[facility] [place], [place], [address] Phone: [phone]   Fax:  [phone]  Physical Therapy Treatment  Patient Details  Name: Caitlin Oliver MRN: [id number] Date of Birth: 22-Nov-1940 Referring Provider: Dr. Curt Bears  Encounter Date: 04/19/2016      PT End of Session - 04/19/16 1109    Visit Number 7   Number of Visits 9   Date for PT Re-Evaluation 04/30/16   PT Start Time 1112   PT Stop Time 1155   PT Time Calculation (min) 43 min   Activity Tolerance Patient tolerated treatment well   Behavior During Therapy [facility] for tasks assessed/performed      Past Medical History:  Diagnosis Date  . Anxiety   . Carotid artery disease ([place])   . Claudication ([place])   . COPD (chronic obstructive pulmonary disease) ([place])   . Diabetes mellitus   . Dizzy   . GERD (gastroesophageal reflux disease)   . Headache   . History of kidney stones   . Hypercholesteremia   . Hypertension   . 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 Shortness of breath    with exertion  . Stomach ulcer   . Tobacco abuse     Past Surgical History:  Procedure Laterality Date  . ABDOMINAL HYSTERECTOMY    . APPENDECTOMY    . BREAST REDUCTION SURGERY    . CAROTID ANGIOGRAM N/A 02/25/2013   Procedure: CAROTID ANGIOGRAM;  Surgeon: Lorretta Harp, MD;  Location: [facility] CATH LAB;  Service: Cardiovascular;  Laterality: N/A;  . ENDARTERECTOMY Right 03/06/2013   Procedure: ENDARTERECTOMY CAROTID-RIGHT;  Surgeon: Serafina Mitchell, MD;  Location: [place];  Service: Vascular;  Laterality: Right;  . ENDARTERECTOMY Left 05/07/2013   Procedure: LEFT CAROTID ARTERY ENDARTERECTOMY WITH VASCU-GUARD PATCH ANGIOPLASTY ;  Surgeon: Serafina Mitchell, MD;  Location: [place];  Service: Vascular;  Laterality: Left;  . LOBECTOMY Left 02/03/2016   Procedure: LEFT UPPER LOBECTOMY;  Surgeon: [name], MD;  Location: [place];   Service: Thoracic;  Laterality: Left;  . LOWER EXTREMITY ANGIOGRAM N/A 02/25/2013   Procedure: LOWER EXTREMITY ANGIOGRAM;  Surgeon: Lorretta Harp, MD;  Location: [facility] CATH LAB;  Service: Cardiovascular;  Laterality: N/A;  . LOWER EXTREMITY ANGIOGRAM N/A 07/27/2013   Procedure: LOWER EXTREMITY ANGIOGRAM;  Surgeon: Lorretta Harp, MD;  Location: [facility] CATH LAB;  Service: Cardiovascular;  Laterality: N/A;  . PATCH ANGIOPLASTY Right 03/06/2013   Procedure: PATCH ANGIOPLASTY of Right Carotid Artery using Vascu-Guard Patch;  Surgeon: Serafina Mitchell, MD;  Location: [place];  Service: Vascular;  Laterality: Right;  . PERIPHERAL VASCULAR CATHETERIZATION N/A 11/15/2015   Procedure: Aortic Arch Angiography;  Surgeon: Serafina Mitchell, MD;  Location: [place] CV LAB;  Service: Cardiovascular;  Laterality: N/A;  . PERIPHERAL VASCULAR CATHETERIZATION Bilateral 11/15/2015   Procedure: Carotid Angiography;  Surgeon: Serafina Mitchell, MD;  Location: [place] CV LAB;  Service: Cardiovascular;  Laterality: Bilateral;  . PERIPHERAL VASCULAR CATHETERIZATION Left 11/15/2015   Procedure: Upper Extremity Angiography;  Surgeon: Serafina Mitchell, MD;  Location: [place] CV LAB;  Service: Cardiovascular;  Laterality: Left;  . PERIPHERAL VASCULAR CATHETERIZATION Left 11/15/2015   Procedure: Peripheral Vascular Intervention;  Surgeon: Serafina Mitchell, MD;  Location: [place] CV LAB;  Service: Cardiovascular;  Laterality: Left;  subclavian   . SALIVARY GLAND SURGERY     scar tissue removed from left saliva glad  .  TUBAL LIGATION    . VIDEO ASSISTED THORACOSCOPY (VATS)/WEDGE RESECTION Left 02/03/2016   Procedure: VIDEO ASSISTED THORACOSCOPY;  Surgeon: [name], MD;  Location: [place];  Service: Thoracic;  Laterality: Left;    There were no vitals filed for this visit.      Subjective Assessment - 04/19/16 1109    Subjective Reports that she has had some pain in both front and back of L ribcage but has had a  cold recently.   Pertinent History Pt. presented with dizziness and nausea; work up resulted in incidental finding of a lung nodule.  Pt. had left upper lobe wedge resection 02/03/16 by VATS for diagnosis of stage Ia squamous cell carcinoma.  She will be followed without further cancer treatment at the moment.  Pt. quit smoking in 2015 after 84 pack-years.  DM, HTN.[name]     Did well after lastRX pain 5/10   Patient Stated Goals get info from all lung clinic providers   Currently in Pain? Yes   Pain Score 5    Pain Location Flank   Pain Orientation Left;Anterior;Posterior   Pain Descriptors / Indicators Dull   Pain Type Surgical pain   Pain Onset More than a month ago            [facility] PT Assessment - 04/19/16 0001      Assessment   Medical Diagnosis left upper lobe squamous cell carcinoma, s/p VATS lobectomy, stage I   Onset Date/Surgical Date 02/03/16   Prior Therapy none     Precautions   Precautions Other (comment)   Precaution Comments cancer precautions     Restrictions   Weight Bearing Restrictions No                     [facility] Adult PT Treatment/Exercise - 04/19/16 0001      Modalities   Modalities Electrical Stimulation     Electrical Stimulation   Electrical Stimulation Location L lateral thoracic/ rib cage   Electrical Stimulation Action IFC   Electrical Stimulation Parameters 80-150 hz x15 min   Electrical Stimulation Goals Pain     Manual Therapy   Manual Therapy Soft tissue mobilization   Soft tissue mobilization Gentle STW/ myofascial work to L flank region from inferior to L breast to posterior L ribcage.                     PT Long Term Goals - 04/19/16 1201      PT LONG TERM GOAL #1   Title Pt will report pain <= 4/10 with daily activities   Time 4   Period Weeks   Status Achieved  Limited with forward bending and dusting still per patient report 04/19/2016     PT LONG TERM GOAL #2   Title Pt will tolerate sleeping in bed x  6 hours without increase in pain   Time 4   Period Weeks   Status Achieved          Lung Clinic Goals - 03/15/16 1458      Patient will be able to verbalize understanding of the benefit of exercise to decrease fatigue.   Status Achieved     Patient will be able to verbalize the importance of posture.   Status Achieved     Patient will be able to demonstrate diaphragmatic breathing for improved lung function.   Status Achieved     Patient will be able to verbalize understanding of the role of  physical therapy to prevent functional decline and who to contact if physical therapy is needed.   Status Achieved             Plan - 04/19/16 1151    Clinical Impression Statement Patient arrived today with reports of cold symptoms such as congestion and cough. No compression bandage used today by patient upon observation. Patient continues to experience most sensitvity around L flank incisions. Minimal tightness present throughout the L flank musclature. Normal modalities response noted following removal of the modalities. Patient experienced "swimmy headed" sensation again today in sidelying and in sitting per patient report. Patient reported she does have a history of migraine but may call MD to consult regarding symptoms. Patient able to achieve all goals set at evaluation per report by patient.   Rehab Potential Good   PT Frequency 2x / week   PT Duration 4 weeks   PT Treatment/Interventions ADLs/Self Care Home Management;Electrical Stimulation;Moist Heat;Cryotherapy;Therapeutic exercise;Patient/family education;Manual techniques;Scar mobilization;Passive range of motion;Taping   PT Next Visit Plan continue estim, STW      Patient will benefit from skilled therapeutic intervention in order to improve the following deficits and impairments:  Pain, Decreased range of motion  Visit Diagnosis: Aftercare following surgery for neoplasm  Other symptoms and signs involving the  musculoskeletal system     Problem List Patient Active Problem List   Diagnosis Date Noted  . Cancer of left lung ([place]) 02/03/2016  . COPD GOLD II 12/08/2015  . Solitary pulmonary nodule 12/08/2015  . Bilateral occipital neuralgia 11/22/2015  . Double vision 10/28/2015  . TIA (transient ischemic attack) 10/28/2015  . Subclavian artery stenosis, left ([place]) 10/28/2015  . Subclavian steal syndrome 10/28/2015  . Acute blood loss anemia 10/20/2013  . C. difficile colitis 10/19/2013  . GI bleed 10/18/2013  . COPD 10/18/2013  . Bleeding gastrointestinal 10/18/2013  . Hypoxia 10/10/2013  . Bronchitis 10/08/2013  . PVD (peripheral vascular disease) with claudication ([place]) 07/29/2013  . Carotid stenosis 05/07/2013  . Carotid artery narrowing 05/07/2013  . Occlusion and stenosis of carotid artery without mention of cerebral infarction 03/02/2013  . Carotid artery obstruction 03/02/2013  . Carotid artery disease ([place]) 02/27/2013  . Tobacco abuse 02/27/2013  . Compulsive tobacco user syndrome 02/27/2013  . Claudication ([place]) 01/20/2013  . Essential hypertension 01/20/2013  . Type 2 diabetes mellitus ([place]) 01/20/2013  . Hyperlipidemia 01/20/2013    Wynelle Fanny, PTA 04/19/2016, 12:02 PM  [facility] [address] [place], [place], [address] Phone: [phone]   Fax:  [phone]  Name: Caitlin Oliver MRN: [id number] Date of Birth: [birth year]

## 2016-04-24 ENCOUNTER — Other Ambulatory Visit: Payer: Self-pay | Admitting: Cardiovascular Disease

## 2016-04-24 ENCOUNTER — Ambulatory Visit: Payer: PPO | Admitting: *Deleted

## 2016-04-24 DIAGNOSIS — I739 Peripheral vascular disease, unspecified: Secondary | ICD-10-CM

## 2016-04-24 DIAGNOSIS — R29898 Other symptoms and signs involving the musculoskeletal system: Secondary | ICD-10-CM

## 2016-04-24 DIAGNOSIS — Z483 Aftercare following surgery for neoplasm: Secondary | ICD-10-CM | POA: Diagnosis not present

## 2016-04-24 NOTE — Therapy (Signed)
[facility] [place], [place], [address] Phone: [phone]   Fax:  [phone]  Physical Therapy Treatment  Patient Details  Name: SHEPHANIE ROMAS MRN: [id number] Date of Birth: [birth year] Referring Provider: Dr. Curt Bears  Encounter Date: 04/24/2016      PT End of Session - 04/24/16 1355    Visit Number 8   Number of Visits 9   Date for PT Re-Evaluation 04/30/16   PT Start Time 1300   PT Stop Time 4536   PT Time Calculation (min) 52 min      Past Medical History:  Diagnosis Date  . Anxiety   . Carotid artery disease ([place])   . Claudication ([place])   . COPD (chronic obstructive pulmonary disease) ([place])   . Diabetes mellitus   . Dizzy   . GERD (gastroesophageal reflux disease)   . Headache   . History of kidney stones   . Hypercholesteremia   . Hypertension   . 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 Shortness of breath    with exertion  . Stomach ulcer   . Tobacco abuse     Past Surgical History:  Procedure Laterality Date  . ABDOMINAL HYSTERECTOMY    . APPENDECTOMY    . BREAST REDUCTION SURGERY    . CAROTID ANGIOGRAM N/A 02/25/2013   Procedure: CAROTID ANGIOGRAM;  Surgeon: Lorretta Harp, MD;  Location: [facility] CATH LAB;  Service: Cardiovascular;  Laterality: N/A;  . ENDARTERECTOMY Right 03/06/2013   Procedure: ENDARTERECTOMY CAROTID-RIGHT;  Surgeon: Serafina Mitchell, MD;  Location: [place];  Service: Vascular;  Laterality: Right;  . ENDARTERECTOMY Left 05/07/2013   Procedure: LEFT CAROTID ARTERY ENDARTERECTOMY WITH VASCU-GUARD PATCH ANGIOPLASTY ;  Surgeon: Serafina Mitchell, MD;  Location: [place];  Service: Vascular;  Laterality: Left;  . LOBECTOMY Left 02/03/2016   Procedure: LEFT UPPER LOBECTOMY;  Surgeon: [name], MD;  Location: [place];  Service: Thoracic;  Laterality: Left;  . LOWER EXTREMITY ANGIOGRAM N/A 02/25/2013   Procedure: LOWER EXTREMITY  ANGIOGRAM;  Surgeon: Lorretta Harp, MD;  Location: [facility] CATH LAB;  Service: Cardiovascular;  Laterality: N/A;  . LOWER EXTREMITY ANGIOGRAM N/A 07/27/2013   Procedure: LOWER EXTREMITY ANGIOGRAM;  Surgeon: Lorretta Harp, MD;  Location: [facility] CATH LAB;  Service: Cardiovascular;  Laterality: N/A;  . PATCH ANGIOPLASTY Right 03/06/2013   Procedure: PATCH ANGIOPLASTY of Right Carotid Artery using Vascu-Guard Patch;  Surgeon: Serafina Mitchell, MD;  Location: [place];  Service: Vascular;  Laterality: Right;  . PERIPHERAL VASCULAR CATHETERIZATION N/A 11/15/2015   Procedure: Aortic Arch Angiography;  Surgeon: Serafina Mitchell, MD;  Location: [place] CV LAB;  Service: Cardiovascular;  Laterality: N/A;  . PERIPHERAL VASCULAR CATHETERIZATION Bilateral 11/15/2015   Procedure: Carotid Angiography;  Surgeon: Serafina Mitchell, MD;  Location: [place] CV LAB;  Service: Cardiovascular;  Laterality: Bilateral;  . PERIPHERAL VASCULAR CATHETERIZATION Left 11/15/2015   Procedure: Upper Extremity Angiography;  Surgeon: Serafina Mitchell, MD;  Location: [place] CV LAB;  Service: Cardiovascular;  Laterality: Left;  . PERIPHERAL VASCULAR CATHETERIZATION Left 11/15/2015   Procedure: Peripheral Vascular Intervention;  Surgeon: Serafina Mitchell, MD;  Location: [place] CV LAB;  Service: Cardiovascular;  Laterality: Left;  subclavian   . SALIVARY GLAND SURGERY     scar tissue removed from left saliva glad  . TUBAL LIGATION    . VIDEO ASSISTED THORACOSCOPY (VATS)/WEDGE RESECTION Left 02/03/2016   Procedure:  VIDEO ASSISTED THORACOSCOPY;  Surgeon: [name], MD;  Location: [place];  Service: Thoracic;  Laterality: Left;    There were no vitals filed for this visit.      Subjective Assessment - 04/24/16 1303    Subjective Reports that she has had some pain in both front and back of L ribcage but has had a cold recently.   Patient Stated Goals get info from all lung clinic providers   Pain Score 4    Pain Location  Flank   Pain Orientation Anterior;Posterior   Pain Descriptors / Indicators Dull   Pain Type Surgical pain   Pain Onset More than a month ago   Pain Frequency Intermittent                         [facility] Adult PT Treatment/Exercise - 04/24/16 0001      Modalities   Modalities Electrical Stimulation     Electrical Stimulation   Electrical Stimulation Location L lateral thoracic/ rib cage  Premod x 20 mins 2 ch.'s in RT sidelying   Electrical Stimulation Goals Pain     Manual Therapy   Manual Therapy Soft tissue mobilization   Soft tissue mobilization Gentle STW/ myofascial/ IASTM  work to L flank region from inferior to L breast to posterior L ribcage.                     PT Long Term Goals - 04/19/16 1201      PT LONG TERM GOAL #1   Title Pt will report pain <= 4/10 with daily activities   Time 4   Period Weeks   Status Achieved  Limited with forward bending and dusting still per patient report 04/19/2016     PT LONG TERM GOAL #2   Title Pt will tolerate sleeping in bed x 6 hours without increase in pain   Time 4   Period Weeks   Status Achieved          Lung Clinic Goals - 03/15/16 1458      Patient will be able to verbalize understanding of the benefit of exercise to decrease fatigue.   Status Achieved     Patient will be able to verbalize the importance of posture.   Status Achieved     Patient will be able to demonstrate diaphragmatic breathing for improved lung function.   Status Achieved     Patient will be able to verbalize understanding of the role of physical therapy to prevent functional decline and who to contact if physical therapy is needed.   Status Achieved             Plan - 04/24/16 1400    Clinical Impression Statement Pt arrived today and increased soreness on LT side. She thinks it may be from caughing a lot due to her cold. Pt continues to experience most sensitivity around L flank incision still. Each  incision is still tender during STW, but mobility is increasing.   Rehab Potential Good   PT Frequency 2x / week   PT Duration 4 weeks   PT Treatment/Interventions ADLs/Self Care Home Management;Electrical Stimulation;Moist Heat;Cryotherapy;Therapeutic exercise;Patient/family education;Manual techniques;Scar mobilization;Passive range of motion;Taping   PT Next Visit Plan continue estim, STW   PT Home Exercise Plan walking, breathing exercise   Consulted and Agree with Plan of Care Patient      Patient will benefit from skilled therapeutic intervention in order to improve the  following deficits and impairments:  Pain, Decreased range of motion  Visit Diagnosis: Aftercare following surgery for neoplasm  Other symptoms and signs involving the musculoskeletal system     Problem List Patient Active Problem List   Diagnosis Date Noted  . Cancer of left lung ([place]) 02/03/2016  . COPD GOLD II 12/08/2015  . Solitary pulmonary nodule 12/08/2015  . Bilateral occipital neuralgia 11/22/2015  . Double vision 10/28/2015  . TIA (transient ischemic attack) 10/28/2015  . Subclavian artery stenosis, left ([place]) 10/28/2015  . Subclavian steal syndrome 10/28/2015  . Acute blood loss anemia 10/20/2013  . C. difficile colitis 10/19/2013  . GI bleed 10/18/2013  . COPD 10/18/2013  . Bleeding gastrointestinal 10/18/2013  . Hypoxia 10/10/2013  . Bronchitis 10/08/2013  . PVD (peripheral vascular disease) with claudication ([place]) 07/29/2013  . Carotid stenosis 05/07/2013  . Carotid artery narrowing 05/07/2013  . Occlusion and stenosis of carotid artery without mention of cerebral infarction 03/02/2013  . Carotid artery obstruction 03/02/2013  . Carotid artery disease ([place]) 02/27/2013  . Tobacco abuse 02/27/2013  . Compulsive tobacco user syndrome 02/27/2013  . Claudication ([place]) 01/20/2013  . Essential hypertension 01/20/2013  . Type 2 diabetes mellitus ([place]) 01/20/2013  . Hyperlipidemia  01/20/2013    Caetano Oberhaus,CHRIS , PTA 04/24/2016, 3:14 PM  [facility] [address] [place], [place], [address] Phone: [phone]   Fax:  [phone]  Name: ALIZAYA OSHEA MRN: [id number] Date of Birth: 04-09-1941

## 2016-04-26 ENCOUNTER — Encounter: Payer: Self-pay | Admitting: Physical Therapy

## 2016-04-26 ENCOUNTER — Ambulatory Visit: Payer: PPO | Admitting: Physical Therapy

## 2016-04-26 DIAGNOSIS — R29898 Other symptoms and signs involving the musculoskeletal system: Secondary | ICD-10-CM

## 2016-04-26 DIAGNOSIS — Z483 Aftercare following surgery for neoplasm: Secondary | ICD-10-CM | POA: Diagnosis not present

## 2016-04-26 NOTE — Therapy (Addendum)
[facility] [place], [place], [address] Phone: [phone]   Fax:  [phone]  Physical Therapy Treatment  Patient Details  Name: Caitlin Oliver MRN: [id number] Date of Birth: Jan 06, 1941 Referring Provider: Dr. Curt Bears  Encounter Date: 04/26/2016      PT End of Session - 04/26/16 1118    Visit Number 9   Number of Visits 9   Date for PT Re-Evaluation 04/30/16   PT Start Time 1119   PT Stop Time 1204   PT Time Calculation (min) 45 min   Activity Tolerance Patient tolerated treatment well   Behavior During Therapy [facility] for tasks assessed/performed      Past Medical History:  Diagnosis Date  . Anxiety   . Carotid artery disease ([place])   . Claudication ([place])   . COPD (chronic obstructive pulmonary disease) ([place])   . Diabetes mellitus   . Dizzy   . GERD (gastroesophageal reflux disease)   . Headache   . History of kidney stones   . Hypercholesteremia   . Hypertension   . 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 Shortness of breath    with exertion  . Stomach ulcer   . Tobacco abuse     Past Surgical History:  Procedure Laterality Date  . ABDOMINAL HYSTERECTOMY    . APPENDECTOMY    . BREAST REDUCTION SURGERY    . CAROTID ANGIOGRAM N/A 02/25/2013   Procedure: CAROTID ANGIOGRAM;  Surgeon: Lorretta Harp, MD;  Location: [facility] CATH LAB;  Service: Cardiovascular;  Laterality: N/A;  . ENDARTERECTOMY Right 03/06/2013   Procedure: ENDARTERECTOMY CAROTID-RIGHT;  Surgeon: Serafina Mitchell, MD;  Location: [place];  Service: Vascular;  Laterality: Right;  . ENDARTERECTOMY Left 05/07/2013   Procedure: LEFT CAROTID ARTERY ENDARTERECTOMY WITH VASCU-GUARD PATCH ANGIOPLASTY ;  Surgeon: Serafina Mitchell, MD;  Location: [place];  Service: Vascular;  Laterality: Left;  . LOBECTOMY Left 02/03/2016   Procedure: LEFT UPPER LOBECTOMY;  Surgeon: [name], MD;  Location: [place];  Service: Thoracic;  Laterality: Left;  . LOWER EXTREMITY ANGIOGRAM N/A 02/25/2013   Procedure: LOWER EXTREMITY ANGIOGRAM;  Surgeon: Lorretta Harp, MD;  Location: [facility] CATH LAB;  Service: Cardiovascular;  Laterality: N/A;  . LOWER EXTREMITY ANGIOGRAM N/A 07/27/2013   Procedure: LOWER EXTREMITY ANGIOGRAM;  Surgeon: Lorretta Harp, MD;  Location: [facility] CATH LAB;  Service: Cardiovascular;  Laterality: N/A;  . PATCH ANGIOPLASTY Right 03/06/2013   Procedure: PATCH ANGIOPLASTY of Right Carotid Artery using Vascu-Guard Patch;  Surgeon: Serafina Mitchell, MD;  Location: [place];  Service: Vascular;  Laterality: Right;  . PERIPHERAL VASCULAR CATHETERIZATION N/A 11/15/2015   Procedure: Aortic Arch Angiography;  Surgeon: Serafina Mitchell, MD;  Location: [place] CV LAB;  Service: Cardiovascular;  Laterality: N/A;  . PERIPHERAL VASCULAR CATHETERIZATION Bilateral 11/15/2015   Procedure: Carotid Angiography;  Surgeon: Serafina Mitchell, MD;  Location: [place] CV LAB;  Service: Cardiovascular;  Laterality: Bilateral;  . PERIPHERAL VASCULAR CATHETERIZATION Left 11/15/2015   Procedure: Upper Extremity Angiography;  Surgeon: Serafina Mitchell, MD;  Location: [place] CV LAB;  Service: Cardiovascular;  Laterality: Left;  . PERIPHERAL VASCULAR CATHETERIZATION Left 11/15/2015   Procedure: Peripheral Vascular Intervention;  Surgeon: Serafina Mitchell, MD;  Location: [place] CV LAB;  Service: Cardiovascular;  Laterality: Left;  subclavian   . SALIVARY GLAND SURGERY     scar tissue removed from left saliva glad  .  TUBAL LIGATION    . VIDEO ASSISTED THORACOSCOPY (VATS)/WEDGE RESECTION Left 02/03/2016   Procedure: VIDEO ASSISTED THORACOSCOPY;  Surgeon: [name], MD;  Location: [place];  Service: Thoracic;  Laterality: Left;    There were no vitals filed for this visit.      Subjective Assessment - 04/26/16 1118    Subjective Reports that she didn't feel any benefit after previous treatment. Reports pain  mostly in posterior L flank.   Pertinent History Pt. presented with dizziness and nausea; work up resulted in incidental finding of a lung nodule.  Pt. had left upper lobe wedge resection 02/03/16 by VATS for diagnosis of stage Ia squamous cell carcinoma.  She will be followed without further cancer treatment at the moment.  Pt. quit smoking in 2015 after 84 pack-years.  DM, HTN.[name]     Did well after lastRX pain 5/10   Patient Stated Goals get info from all lung clinic providers   Currently in Pain? Yes   Pain Score 5    Pain Location Flank   Pain Orientation Left;Anterior;Posterior   Pain Descriptors / Indicators Dull;Aching   Pain Type Surgical pain   Pain Onset More than a month ago            [facility] PT Assessment - 04/26/16 0001      Assessment   Medical Diagnosis left upper lobe squamous cell carcinoma, s/p VATS lobectomy, stage I   Onset Date/Surgical Date 02/03/16   Prior Therapy none     Precautions   Precautions Other (comment)   Precaution Comments cancer precautions     Restrictions   Weight Bearing Restrictions No                     [facility] Adult PT Treatment/Exercise - 04/26/16 0001      Modalities   Modalities Electrical Stimulation     Electrical Stimulation   Electrical Stimulation Location L lateral and posterior thoracic/ rib cage  in R sidelying   Electrical Stimulation Action IFC   Electrical Stimulation Parameters 80-150 hz x15 min   Electrical Stimulation Goals Pain     Manual Therapy   Manual Therapy Soft tissue mobilization   Soft tissue mobilization STW to L lateral and posterior thoracic region over ribcage to decrease pain per patient complaint  in R sidelying                     PT Long Term Goals - 04/19/16 1201      PT LONG TERM GOAL #1   Title Pt will report pain <= 4/10 with daily activities   Time 4   Period Weeks   Status Achieved  Limited with forward bending and dusting still per patient report  04/19/2016     PT LONG TERM GOAL #2   Title Pt will tolerate sleeping in bed x 6 hours without increase in pain   Time 4   Period Weeks   Status Achieved          Lung Clinic Goals - 03/15/16 1458      Patient will be able to verbalize understanding of the benefit of exercise to decrease fatigue.   Status Achieved     Patient will be able to verbalize the importance of posture.   Status Achieved     Patient will be able to demonstrate diaphragmatic breathing for improved lung function.   Status Achieved     Patient will be able to verbalize  understanding of the role of physical therapy to prevent functional decline and who to contact if physical therapy is needed.   Status Achieved             Plan - 04/26/16 1202    Clinical Impression Statement Patient arrived today with reports of no benefit noted following previous treatment. Patient experienced increased L posterior rib cage pain today upon arrival with continued pain reported inferior to L breast. Patient continues to experience vertigo/ "swimmy head" upon sit to sideyling or supine to sit as well as cramping in her feet. Patient intermittantly experiences sharp pains with manual therapy or sensitive regions. Normal modalities response noted following removal of the modalities. Patient educated to contact clinic with response about PT from MD.   Rehab Potential Good   PT Frequency 2x / week   PT Duration 4 weeks   PT Treatment/Interventions ADLs/Self Care Home Management;Electrical Stimulation;Moist Heat;Cryotherapy;Therapeutic exercise;Patient/family education;Manual techniques;Scar mobilization;Passive range of motion;Taping   PT Next Visit Plan Place chart on hold and patient to consult with MD as to continue or D/C.   PT Home Exercise Plan walking, breathing exercise   Consulted and Agree with Plan of Care Patient      Patient will benefit from skilled therapeutic intervention in order to improve the following  deficits and impairments:  Pain, Decreased range of motion  Visit Diagnosis: Aftercare following surgery for neoplasm  Other symptoms and signs involving the musculoskeletal system     Problem List Patient Active Problem List   Diagnosis Date Noted  . Cancer of left lung ([place]) 02/03/2016  . COPD GOLD II 12/08/2015  . Solitary pulmonary nodule 12/08/2015  . Bilateral occipital neuralgia 11/22/2015  . Double vision 10/28/2015  . TIA (transient ischemic attack) 10/28/2015  . Subclavian artery stenosis, left ([place]) 10/28/2015  . Subclavian steal syndrome 10/28/2015  . Acute blood loss anemia 10/20/2013  . C. difficile colitis 10/19/2013  . GI bleed 10/18/2013  . COPD 10/18/2013  . Bleeding gastrointestinal 10/18/2013  . Hypoxia 10/10/2013  . Bronchitis 10/08/2013  . PVD (peripheral vascular disease) with claudication ([place]) 07/29/2013  . Carotid stenosis 05/07/2013  . Carotid artery narrowing 05/07/2013  . Occlusion and stenosis of carotid artery without mention of cerebral infarction 03/02/2013  . Carotid artery obstruction 03/02/2013  . Carotid artery disease ([place]) 02/27/2013  . Tobacco abuse 02/27/2013  . Compulsive tobacco user syndrome 02/27/2013  . Claudication ([place]) 01/20/2013  . Essential hypertension 01/20/2013  . Type 2 diabetes mellitus ([place]) 01/20/2013  . Hyperlipidemia 01/20/2013    Ahmed Prima, PTA 04/26/16 12:11 PM  [facility] [place], [place], [address] Phone: [phone]   Fax:  [phone]  Name: Caitlin Oliver MRN: [id number] Date of Birth: October 24, 1940   PHYSICAL THERAPY DISCHARGE SUMMARY  Visits from Start of Care: 9.  Current functional level related to goals / functional outcomes: See above.   Remaining deficits: Goals met.   Education / Equipment: HEP. Plan: Patient agrees to discharge.  Patient goals were met. Patient is being discharged due to meeting the stated rehab  goals.  ?????         Mali Applegate MPT

## 2016-04-27 ENCOUNTER — Other Ambulatory Visit: Payer: Self-pay | Admitting: *Deleted

## 2016-04-28 ENCOUNTER — Encounter: Payer: Self-pay | Admitting: *Deleted

## 2016-04-28 NOTE — Patient Outreach (Signed)
Case closure telephone call. [name] reports she is doing well. She has no new health issues. She continues to go for rehabilitation to help reduce her post operative.chest pain. She denies any respiratory difficulties.  Pt has met all her goals. I have advised her that I am closing her case. I have also advised her that I would be available to her for any health issues or for support.  Caitlin Oliver [facility] [phone]

## 2016-04-30 ENCOUNTER — Other Ambulatory Visit: Payer: Self-pay | Admitting: Thoracic Surgery (Cardiothoracic Vascular Surgery)

## 2016-04-30 DIAGNOSIS — C3490 Malignant neoplasm of unspecified part of unspecified bronchus or lung: Secondary | ICD-10-CM

## 2016-05-01 ENCOUNTER — Ambulatory Visit
Admission: RE | Admit: 2016-05-01 | Discharge: 2016-05-01 | Disposition: A | Discharge status: Home / Self Care | Payer: PPO | Source: Ambulatory Visit | Attending: Thoracic Surgery (Cardiothoracic Vascular Surgery) | Admitting: Thoracic Surgery (Cardiothoracic Vascular Surgery)

## 2016-05-01 ENCOUNTER — Ambulatory Visit (INDEPENDENT_AMBULATORY_CARE_PROVIDER_SITE_OTHER): Payer: Self-pay | Admitting: Thoracic Surgery (Cardiothoracic Vascular Surgery)

## 2016-05-01 ENCOUNTER — Encounter: Payer: Self-pay | Admitting: Thoracic Surgery (Cardiothoracic Vascular Surgery)

## 2016-05-01 VITALS — BP 104/66 | HR 80 | Resp 20 | Ht 64.0 in | Wt 179.0 lb

## 2016-05-01 DIAGNOSIS — Z902 Acquired absence of lung [part of]: Secondary | ICD-10-CM

## 2016-05-01 DIAGNOSIS — C3412 Malignant neoplasm of upper lobe, left bronchus or lung: Secondary | ICD-10-CM

## 2016-05-01 DIAGNOSIS — C3490 Malignant neoplasm of unspecified part of unspecified bronchus or lung: Secondary | ICD-10-CM

## 2016-05-01 DIAGNOSIS — R918 Other nonspecific abnormal finding of lung field: Secondary | ICD-10-CM | POA: Diagnosis not present

## 2016-05-01 NOTE — Progress Notes (Signed)
[address]       [place] [address]             [phone]    HPI: Mrs. Dauphinee returns for a scheduled follow-up visit  She is a 76 year old woman who had a thoracoscopic left upper lobectomy on 02/03/2016. She had a stage IA squamous cell carcinoma. Postoperative course was complicated by prolonged air leak. She was finally discharged on day 13. I saw her back in the office in mid November. She was still having a lot of pain and using oxycodone about every 6 hours at that time.  Today her primary complaint is dizziness. That was her chief complaint initially that ultimately led to the lung cancer being discovered. It resolved for a while at the surgery, but now has returned. She says it will come on without warning and is particularly bad when she lies down at night. She has an appointment with Bing Matter regarding her dizziness on Thursday.  She saw Dr. Julien Nordmann in our multidisciplinary clinic. No adjuvant chemotherapy was recommended. He plans to do CTs every 6 months for the first 2 years and then annually after that.  Her pain is much better than it was previously. She says this is "night and day." She is using hydrocodone from 0-3 times per day for pain.  Past Medical History:  Diagnosis Date  . Anxiety   . Carotid artery disease ([place])   . Claudication ([place])   . COPD (chronic obstructive pulmonary disease) ([place])   . Diabetes mellitus   . Dizzy   . GERD (gastroesophageal reflux disease)   . Headache   . History of kidney stones   . Hypercholesteremia   . Hypertension   . 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 Shortness of breath    with exertion  . Stomach ulcer   . Tobacco abuse      Current Outpatient Prescriptions  Medication Sig Dispense Refill  . aspirin EC 81 MG tablet Take 81 mg by mouth daily.    . diazepam (VALIUM) 5 MG tablet Take 5 mg by mouth 2 (two) times daily. Anxiety    .  diltiazem (DILT-XR) 180 MG 24 hr capsule Take 1 capsule (180 mg total) by mouth daily. 30 capsule 1  . fish oil-omega-3 fatty acids 1000 MG capsule Take 1 g by mouth daily.     [name] HYDROcodone-acetaminophen (NORCO/VICODIN) 5-325 MG tablet Take 1 tablet by mouth every 6 (six) hours as needed for moderate pain.    . metFORMIN (GLUCOPHAGE) 500 MG tablet Take 500 mg by mouth daily with breakfast.     . metoprolol tartrate (LOPRESSOR) 25 MG tablet Take 0.5 tablets (12.5 mg total) by mouth 2 (two) times daily. 30 tablet 1  . naphazoline-glycerin (CLEAR EYES) 0.012-0.2 % SOLN Place 1-2 drops into both eyes 4 (four) times daily as needed (for itchy eyes).    . NONFORMULARY OR COMPOUNDED ITEM [facility]:  Combo Pain #1 - Baclofen 3%, Gabapentin 8%, Aripiprazole 2.8%, Ketorolac 8%, Licocaine 5%, dispense 240grams, apply 1-2 pumps to focal pain area 3-4 times daily, rub in 1-2 minutes, +3Refills (Patient taking differently: Apply 1 application topically 3 (three) times daily as needed (for foot pain.). [facility]:  Combo Pain #1 - Baclofen 3%, Gabapentin 8%, Aripiprazole 7.8%, Ketorolac 8%, Licocaine 5%, dispense 240grams, apply 1-2 pumps to focal pain area 3-4 times daily, rub in 1-2 minutes, +  3Refills) 240 each 3  . pantoprazole (PROTONIX) 40 MG tablet Take 40 mg by mouth 2 (two) times daily.     . pravastatin (PRAVACHOL) 40 MG tablet Take 40 mg by mouth daily.     No current facility-administered medications for this visit.     Physical Exam BP 104/66 (BP Location: Left Arm, Patient Position: Sitting, Cuff Size: Large)   Pulse 80   Resp 20   Ht _0  (1.626 m)   Wt 179 lb (81.2 kg)   SpO2 95% Comment: RA  BMI 30.79 kg/m  76 year old woman in no acute distress Alert and oriented 3 with no focal deficits Lungs diminished at left base, otherwise clear Incisions well healed  Diagnostic Tests: CHEST  2 VIEW  COMPARISON:  02/28/2016  FINDINGS: Cardiomediastinal  silhouette is unremarkable. Stable postsurgical changes left hemithorax and left basilar scarring. Atherosclerotic calcifications of thoracic aorta again noted. No infiltrate or pulmonary edema. Osteopenia and mild degenerative change thoracic spine.  IMPRESSION: No active cardiopulmonary disease.  Stable left basilar scarring.   Electronically Signed   By: Lahoma Crocker M.D.   On: 05/01/2016 10:43 I personally reviewed the chest x-ray current findings and above.  Impression: Mrs. Hastings is a 76 year old woman who is now 3 months out from a thoracoscopic left upper lobectomy for a stage IA squamous cell carcinoma. She does not require any adjuvant chemotherapy or radiation. Her prognosis is good.  Postoperative pain- has improved dramatically as would be expected. She is still using hydrocodone occasionally. I encouraged her to try to wean off of that completely.  Dizziness- no orthostatic component, probably vertigo. Will defer to primary care  Plan: Return in 9 months for one year follow-up  [name], MD [facility] [phone]

## 2016-05-03 ENCOUNTER — Other Ambulatory Visit (HOSPITAL_COMMUNITY): Payer: PPO

## 2016-05-07 DIAGNOSIS — L299 Pruritus, unspecified: Secondary | ICD-10-CM | POA: Diagnosis not present

## 2016-05-07 DIAGNOSIS — B373 Candidiasis of vulva and vagina: Secondary | ICD-10-CM | POA: Diagnosis not present

## 2016-05-07 DIAGNOSIS — R252 Cramp and spasm: Secondary | ICD-10-CM | POA: Diagnosis not present

## 2016-05-11 ENCOUNTER — Encounter (HOSPITAL_COMMUNITY): Payer: PPO

## 2016-05-11 ENCOUNTER — Ambulatory Visit (HOSPITAL_COMMUNITY)
Admission: RE | Admit: 2016-05-11 | Discharge: 2016-05-11 | Disposition: A | Discharge status: Home / Self Care | Payer: PPO | Source: Ambulatory Visit | Attending: Cardiology | Admitting: Cardiology

## 2016-05-11 DIAGNOSIS — I745 Embolism and thrombosis of iliac artery: Secondary | ICD-10-CM | POA: Insufficient documentation

## 2016-05-11 DIAGNOSIS — I10 Essential (primary) hypertension: Secondary | ICD-10-CM

## 2016-05-11 DIAGNOSIS — I700 Atherosclerosis of aorta: Secondary | ICD-10-CM | POA: Insufficient documentation

## 2016-05-11 DIAGNOSIS — I6523 Occlusion and stenosis of bilateral carotid arteries: Secondary | ICD-10-CM | POA: Diagnosis not present

## 2016-05-11 DIAGNOSIS — I6522 Occlusion and stenosis of left carotid artery: Secondary | ICD-10-CM

## 2016-05-11 DIAGNOSIS — I739 Peripheral vascular disease, unspecified: Secondary | ICD-10-CM | POA: Diagnosis not present

## 2016-05-15 ENCOUNTER — Other Ambulatory Visit: Payer: Self-pay | Admitting: Cardiovascular Disease

## 2016-05-15 DIAGNOSIS — I779 Disorder of arteries and arterioles, unspecified: Secondary | ICD-10-CM

## 2016-05-15 DIAGNOSIS — I739 Peripheral vascular disease, unspecified: Secondary | ICD-10-CM

## 2016-06-01 ENCOUNTER — Ambulatory Visit: Payer: PPO | Admitting: Cardiovascular Disease

## 2016-06-06 ENCOUNTER — Encounter: Payer: Self-pay | Admitting: Cardiovascular Disease

## 2016-06-06 ENCOUNTER — Ambulatory Visit (INDEPENDENT_AMBULATORY_CARE_PROVIDER_SITE_OTHER): Payer: PPO | Admitting: Cardiovascular Disease

## 2016-06-06 VITALS — BP 144/83 | HR 75 | Ht 64.0 in | Wt 181.2 lb

## 2016-06-06 DIAGNOSIS — I739 Peripheral vascular disease, unspecified: Secondary | ICD-10-CM | POA: Diagnosis not present

## 2016-06-06 DIAGNOSIS — I10 Essential (primary) hypertension: Secondary | ICD-10-CM

## 2016-06-06 DIAGNOSIS — E7800 Pure hypercholesterolemia, unspecified: Secondary | ICD-10-CM | POA: Diagnosis not present

## 2016-06-06 DIAGNOSIS — I779 Disorder of arteries and arterioles, unspecified: Secondary | ICD-10-CM | POA: Diagnosis not present

## 2016-06-06 DIAGNOSIS — Z720 Tobacco use: Secondary | ICD-10-CM

## 2016-06-06 DIAGNOSIS — G458 Other transient cerebral ischemic attacks and related syndromes: Secondary | ICD-10-CM

## 2016-06-06 NOTE — Progress Notes (Signed)
06/06/2016 Caitlin Oliver   [birth year]  [id number]  Primary Physician Bing Matter, PA-C Primary Cardiologist: Lorretta Harp MD [name]  HPI:  [name] is a 76 year old mildly overweight widowed Caucasian female no children (son died 35 years ago) referred by Emmie Niemann PA-C at [facility] for evaluation and treatment of lifestyle limiting claudication. I last saw her in the office 03/22/15. Her .Risk factors included hypertension, hyperlipidemia and type 2 diabetes. She has a 75-pack-year history of tobacco abuse smoking one and a half packs a day however she has recently quit smoking prior to her interventions.. There's no family history of heart disease. She's never had a heart for stroke. She does complain of dyspnea but denies chest pain. She complains of bilateral lower extremity claudication while walking up an incline which is lifestyle limiting. I age of Imdur 02/25/13 revealing high-grade bilateral internal carotid artery stenosis, high-grade bilateral calcified iliac artery stenosis as most common femoral artery stenoses. She had staged right followed by left carotid endarterectomy performed by Dr. Trula Slade which she has recovered from. Because of lifestyle limiting claudication she underwent diamondback orbital rotational atherectomy, PT A. And stenting using ICast covered stents by myself on 07/27/13 which resulted in complete resolution of her claudication symptoms. Her Dopplers normalized as well though she did have a high-frequency signal in the right common femoral artery. She has had a GI bleed in the past and her Plavix was discontinued. She denies chest pain, shortness of breath as well as claudication. She has had left subclavian stenting by Dr. Trula Slade 11/15/15. In addition, she had recent left upper lobectomy by Dr. Gorden Harms in 02/03/16 because of stage IA squamous cell carcinoma. She has recovered from this.   Current  Outpatient Prescriptions  Medication Sig Dispense Refill  . aspirin EC 81 MG tablet Take 81 mg by mouth daily.    . diazepam (VALIUM) 5 MG tablet Take 5 mg by mouth 2 (two) times daily. Anxiety    . diltiazem (DILT-XR) 180 MG 24 hr capsule Take 1 capsule (180 mg total) by mouth daily. 30 capsule 1  . fish oil-omega-3 fatty acids 1000 MG capsule Take 1 g by mouth daily.     [name] HYDROcodone-acetaminophen (NORCO/VICODIN) 5-325 MG tablet Take 1 tablet by mouth every 6 (six) hours as needed for moderate pain.    [name] HYDROcodone-acetaminophen (NORCO/VICODIN) 5-325 MG tablet Take 1 tablet by mouth as directed.    . metFORMIN (GLUCOPHAGE) 500 MG tablet Take 500 mg by mouth daily with breakfast.     . metoprolol tartrate (LOPRESSOR) 25 MG tablet Take 0.5 tablets (12.5 mg total) by mouth 2 (two) times daily. 30 tablet 1  . naphazoline-glycerin (CLEAR EYES) 0.012-0.2 % SOLN Place 1-2 drops into both eyes 4 (four) times daily as needed (for itchy eyes).    . NONFORMULARY OR COMPOUNDED ITEM [facility]:  Combo Pain #1 - Baclofen 3%, Gabapentin 8%, Aripiprazole 2.6%, Ketorolac 8%, Licocaine 5%, dispense 240grams, apply 1-2 pumps to focal pain area 3-4 times daily, rub in 1-2 minutes, +3Refills (Patient taking differently: Apply 1 application topically 3 (three) times daily as needed (for foot pain.). [facility]:  Combo Pain #1 - Baclofen 3%, Gabapentin 8%, Aripiprazole 3.3%, Ketorolac 8%, Licocaine 5%, dispense 240grams, apply 1-2 pumps to focal pain area 3-4 times daily, rub in 1-2 minutes, +3Refills) 240 each 3  . pantoprazole (PROTONIX) 40 MG tablet Take 40 mg by mouth 2 (two) times  daily.     . pravastatin (PRAVACHOL) 40 MG tablet Take 40 mg by mouth daily.     No current facility-administered medications for this visit.     Allergies  Allergen Reactions  . No Known Allergies Other (See Comments)    Social History   Social History  . Marital status: Widowed    Spouse  name: N/A  . Number of children: N/A  . Years of education: N/A   Occupational History  . Not on file.   Social History Main Topics  . Smoking status: Former Smoker    Packs/day: 1.50    Years: 56.00    Types: Cigarettes    Quit date: 07/07/2013  . Smokeless tobacco: Former Systems developer    Quit date: 02/25/2013  . Alcohol use No  . Drug use: No  . Sexual activity: No   Other Topics Concern  . Not on file   Social History Narrative  . No narrative on file     Review of Systems: General: negative for chills, fever, night sweats or weight changes.  Cardiovascular: negative for chest pain, dyspnea on exertion, edema, orthopnea, palpitations, paroxysmal nocturnal dyspnea or shortness of breath Dermatological: negative for rash Respiratory: negative for cough or wheezing Urologic: negative for hematuria Abdominal: negative for nausea, vomiting, diarrhea, bright red blood per rectum, melena, or hematemesis Neurologic: negative for visual changes, syncope, or dizziness All other systems reviewed and are otherwise negative except as noted above.    Blood pressure (!) 144/83, pulse 75, height _0  (1.626 m), weight 181 lb 3.2 oz (82.2 kg), SpO2 91 %.  General appearance: alert and no distress Neck: no adenopathy, no JVD, supple, symmetrical, trachea midline, thyroid not enlarged, symmetric, no tenderness/mass/nodules and Soft bilateral carotid bruits Lungs: clear to auscultation bilaterally Heart: regular rate and rhythm, S1, S2 normal, no murmur, click, rub or gallop Extremities: extremities normal, atraumatic, no cyanosis or edema  EKG sinus rhythm at 75 with septal Q waves. I personally reviewed this EKG  ASSESSMENT AND PLAN:   Claudication [facility]) History of peripheral vascular disease status post bilateral ostial common iliac diamondback orbital rotation atherectomy, PTA and covered stenting using ICast covered stents 07/27/13 for lifestyle limiting claudication. She no longer has  claudication. Her recent Doppler studies of her lower extremities reveal widely patent iliac stents with normal ABIs bilaterally.  Essential hypertension History of hypertension blood pressure measures 144/83. She is on diltiazem and Lopressor. Continue current meds at current dosing.  Hyperlipidemia History of hyperlipidemia on statin therapy followed by her PCP  Carotid artery disease ([place]) History of bilateral carotid endarterectomies performed staged fashion by Dr. Trula Slade back in 2014. We followed this by duplex ultrasound which most recently performed 05/11/16 revealed a widely patent right and moderate left ICA stenosis which we follow on an annual basis.  Tobacco abuse Discontinue tobacco abuse 3 years ago at my urging  Subclavian steal syndrome History of symptomatic left subclavian artery stenosis status post PTA and stenting by Dr. Trula Slade 11/15/15. This appears to be patent by recent duplex ultrasound.      Lorretta Harp MD FACP,FACC,FAHA, [facility] 06/06/2016 10:22 AM

## 2016-06-06 NOTE — Patient Instructions (Signed)
Medication Instructions: Your physician recommends that you continue on your current medications as directed. Please refer to the Current Medication list given to you today.  Testing/Procedures: Aorta/Iliac Artery Duplex and Carotid Duplex due in January 2019--prior to appt with Dr. Gwenlyn Found.   Follow-Up: Your physician wants you to follow-up in: 1 year with Dr. Gwenlyn Found. You will receive a reminder letter in the mail two months in advance. If you don't receive a letter, please call our office to schedule the follow-up appointment.  If you need a refill on your cardiac medications before your next appointment, please call your pharmacy.

## 2016-06-06 NOTE — Assessment & Plan Note (Signed)
History of symptomatic left subclavian artery stenosis status post PTA and stenting by Dr. Trula Slade 11/15/15. This appears to be patent by recent duplex ultrasound.

## 2016-06-06 NOTE — Assessment & Plan Note (Signed)
History of hyperlipidemia on statin therapy followed by her PCP 

## 2016-06-06 NOTE — Assessment & Plan Note (Signed)
Discontinue tobacco abuse 3 years ago at my urging

## 2016-06-06 NOTE — Assessment & Plan Note (Addendum)
History of hypertension blood pressure measures 144/83. She is on diltiazem and Lopressor. Continue current meds at current dosing.

## 2016-06-06 NOTE — Assessment & Plan Note (Signed)
History of peripheral vascular disease status post bilateral ostial common iliac diamondback orbital rotation atherectomy, PTA and covered stenting using ICast covered stents 07/27/13 for lifestyle limiting claudication. She no longer has claudication. Her recent Doppler studies of her lower extremities reveal widely patent iliac stents with normal ABIs bilaterally.

## 2016-06-06 NOTE — Assessment & Plan Note (Signed)
History of bilateral carotid endarterectomies performed staged fashion by Dr. Trula Slade back in 2014. We followed this by duplex ultrasound which most recently performed 05/11/16 revealed a widely patent right and moderate left ICA stenosis which we follow on an annual basis.

## 2016-06-13 DIAGNOSIS — R51 Headache: Secondary | ICD-10-CM | POA: Diagnosis not present

## 2016-06-13 DIAGNOSIS — L299 Pruritus, unspecified: Secondary | ICD-10-CM | POA: Diagnosis not present

## 2016-06-13 DIAGNOSIS — R42 Dizziness and giddiness: Secondary | ICD-10-CM | POA: Diagnosis not present

## 2016-06-13 DIAGNOSIS — I10 Essential (primary) hypertension: Secondary | ICD-10-CM | POA: Diagnosis not present

## 2016-06-13 DIAGNOSIS — R0781 Pleurodynia: Secondary | ICD-10-CM | POA: Diagnosis not present

## 2016-06-19 ENCOUNTER — Telehealth: Payer: Self-pay | Admitting: Neurology

## 2016-06-19 ENCOUNTER — Ambulatory Visit (INDEPENDENT_AMBULATORY_CARE_PROVIDER_SITE_OTHER): Payer: PPO | Admitting: Neurology

## 2016-06-19 ENCOUNTER — Encounter: Payer: Self-pay | Admitting: Neurology

## 2016-06-19 VITALS — BP 129/71 | Temp 91.0°F | Wt 181.4 lb

## 2016-06-19 DIAGNOSIS — I771 Stricture of artery: Secondary | ICD-10-CM

## 2016-06-19 DIAGNOSIS — R51 Headache: Principal | ICD-10-CM

## 2016-06-19 DIAGNOSIS — R519 Headache, unspecified: Secondary | ICD-10-CM | POA: Insufficient documentation

## 2016-06-19 DIAGNOSIS — I6522 Occlusion and stenosis of left carotid artery: Secondary | ICD-10-CM | POA: Diagnosis not present

## 2016-06-19 DIAGNOSIS — M5481 Occipital neuralgia: Secondary | ICD-10-CM | POA: Diagnosis not present

## 2016-06-19 DIAGNOSIS — I739 Peripheral vascular disease, unspecified: Secondary | ICD-10-CM

## 2016-06-19 NOTE — Patient Instructions (Signed)
-  continue ASA and pravastatin for stroke prevention - check BP at home and record  - follow up with Dr. Trula Slade and Dr. Gwenlyn Found for carotid stenosis.  - Follow up with your primary care physician for stroke risk factor modification. Recommend maintain blood pressure goal 130-150/80, diabetes with hemoglobin A1c goal below 7.0% and lipids with LDL cholesterol goal below 70 mg/dL.  - consider occipital nerve radioablation if occipital neuralgia recurrence - follow up in 4 months

## 2016-06-19 NOTE — Progress Notes (Signed)
NEUROLOGY CLINIC FOLLOW UP NOTE  NAME: Caitlin Oliver DOB: 11/22/1940  HPI: Caitlin Oliver is a 76 y.o. female with PMH of HTN, HLD, PVD, and carotid stenosis s/p b/l CEAs who presents as a new patient for Episode of double vision and vertigo.   She has bilateral carotid stenosis s/p bilateral CEAs around 2014 about 6 weeks apart with Dr. Trula Slade. Followed with Dr. Gwenlyn Found and CUS on 09/01/14 showed left ICA 50-69% stenosis with left VA retrograde flow. She had recent CUS on 04/27/15 showed right ICA 1-39% stenosis but left ICA progressed to 60-79% stenosis with left VA retrograde flow.  Patient stated that on 10/12/2015 she was in the drugstore reading signs over her head, when she bent down her head she felt sudden onset double vision, and vertigo. Double vision was horizontal diplopia lasted about one hour and gradually resolved. Vertigo feels like head round and round, and she had to sit down due to imbalance, no nausea vomiting, no fall, lasted about one hour gradually resolved. After that, she continued to feel swimmy headed with headache, walking to the right side, had to hold on things on walking. Went to see PCP that day, blood pressure 140/70, recommended to go to ER, however patient refused, only agreed to do CTA head and neck. She had to use a walker for the next 2 days, and she still felt lightheaded for about 2 weeks. She had a CTA head and neck on 10/20/2015 which showed stenosis of right CCA and distal right ICA, as well as left ICA 76% stenosis. In addition, left subclavian artery significant narrowing and origin, proximal to left VA takeoff. Consistent with left VA regional grade flow seen on CUS and therefore subclavian steal syndrome.   Patient today in clinic continues to complain of lightheadedness, posterior head headache, light sensitivity. Check blood pressure 92/56. She stated that she took BP meds this morning. She also stated that her BP is also low at home.    She has hx of  HTN, HLD following with Dr. Gwenlyn Found, on pravastatin, diltiazem, HCTZ, metoprolol and lisinopirl. She also has PVD s/p diamondback orbital rotational atherectomy, PTA and stenting using a cast stents of her iliac arteries by Dr. Judithann Sauger 07/27/13 resulted in complete resolution of her claudication symptoms and normalization of her Dopplers.   She had GI bleeding in 2016 taking aspirin and the Plavix after stents placed in lower extremities. Had a GI consult, required 3 units of blood transfusion. GI workup showed bleeding ulcer in the stomach. She was told not to take any Plavix anymore. She is on aspirin 81 currently. Follow up with GI later was told ulcer was healed.  11/22/15 follow up - patient had followed up with Dr. Trula Slade and had cerebral angiogram as well as left subclavian artery stenting due to high-grade stenosis with subclavian steal syndrome. Patient tolerating with the procedure pretty well. BP check at home much better at 120-140s, today in clinic 100/69. However, patient continue to complain of neck pain, radiating headache to bilateral temporal and vertex, and lightheadedness. Found to have bilateral occipital neuralgia.   03/26/16 follow up - pt has been doing well from neuro standpoint. She had occipital nerve blocks in 11/2015 and 12/2015, and since then no more occipital neuralgia. She followed with [facility] Dr. Trula Slade in 12/2015 and left ICA 60-79% stenosis but VA all patent. She had left upper lobe lung resection for lung cancer treatment in 01/2016 without chemo and radiation. However, she continued to  have left chest pain post op and currently under treatment with pain management. She is only on cardizam and metoprolol now and BP stable. Today 131/78.  Interval history: Pt came in again complaining of headache and dizziness. He followed with oncology for lung caner and planned to repeat CT chest in 08/2016 and PET scan some time. She also followed with Dr. Gwenlyn Found and PCP. Does not remember when HA  restarted but similar as before. She was also referred to ENT Dr.Shoemaker for vertigo but the vertigo she describes is more consistent with orthostasis. BP 129/71 today.    Past Medical History:  Diagnosis Date  . Anxiety   . Carotid artery disease ([place])   . Claudication ([place])   . COPD (chronic obstructive pulmonary disease) ([place])   . Diabetes mellitus   . Dizzy   . GERD (gastroesophageal reflux disease)   . Headache   . History of kidney stones   . Hypercholesteremia   . Hypertension   . 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 Shortness of breath    with exertion  . Stomach ulcer   . Tobacco abuse    Past Surgical History:  Procedure Laterality Date  . ABDOMINAL HYSTERECTOMY    . APPENDECTOMY    . BREAST REDUCTION SURGERY    . CAROTID ANGIOGRAM N/A 02/25/2013   Procedure: CAROTID ANGIOGRAM;  Surgeon: Lorretta Harp, MD;  Location: [facility] CATH LAB;  Service: Cardiovascular;  Laterality: N/A;  . ENDARTERECTOMY Right 03/06/2013   Procedure: ENDARTERECTOMY CAROTID-RIGHT;  Surgeon: Serafina Mitchell, MD;  Location: [place];  Service: Vascular;  Laterality: Right;  . ENDARTERECTOMY Left 05/07/2013   Procedure: LEFT CAROTID ARTERY ENDARTERECTOMY WITH VASCU-GUARD PATCH ANGIOPLASTY ;  Surgeon: Serafina Mitchell, MD;  Location: [place];  Service: Vascular;  Laterality: Left;  . LOBECTOMY Left 02/03/2016   Procedure: LEFT UPPER LOBECTOMY;  Surgeon: [name], MD;  Location: [place];  Service: Thoracic;  Laterality: Left;  . LOWER EXTREMITY ANGIOGRAM N/A 02/25/2013   Procedure: LOWER EXTREMITY ANGIOGRAM;  Surgeon: Lorretta Harp, MD;  Location: [facility] CATH LAB;  Service: Cardiovascular;  Laterality: N/A;  . LOWER EXTREMITY ANGIOGRAM N/A 07/27/2013   Procedure: LOWER EXTREMITY ANGIOGRAM;  Surgeon: Lorretta Harp, MD;  Location[facility] CATH LAB;  Service: Cardiovascular;  Laterality: N/A;  . PATCH ANGIOPLASTY Right 03/06/2013   Procedure: PATCH  ANGIOPLASTY of Right Carotid Artery using Vascu-Guard Patch;  Surgeon: Serafina Mitchell, MD;  Location: [place];  Service: Vascular;  Laterality: Right;  . PERIPHERAL VASCULAR CATHETERIZATION N/A 11/15/2015   Procedure: Aortic Arch Angiography;  Surgeon: Serafina Mitchell, MD;  Location: [place] CV LAB;  Service: Cardiovascular;  Laterality: N/A;  . PERIPHERAL VASCULAR CATHETERIZATION Bilateral 11/15/2015   Procedure: Carotid Angiography;  Surgeon: Serafina Mitchell, MD;  Location: [place] CV LAB;  Service: Cardiovascular;  Laterality: Bilateral;  . PERIPHERAL VASCULAR CATHETERIZATION Left 11/15/2015   Procedure: Upper Extremity Angiography;  Surgeon: Serafina Mitchell, MD;  Location: [place] CV LAB;  Service: Cardiovascular;  Laterality: Left;  . PERIPHERAL VASCULAR CATHETERIZATION Left 11/15/2015   Procedure: Peripheral Vascular Intervention;  Surgeon: Serafina Mitchell, MD;  Location: [place] CV LAB;  Service: Cardiovascular;  Laterality: Left;  subclavian   . SALIVARY GLAND SURGERY     scar tissue removed from left saliva glad  . TUBAL LIGATION    . VIDEO ASSISTED THORACOSCOPY (VATS)/WEDGE RESECTION Left 02/03/2016   Procedure: VIDEO  ASSISTED THORACOSCOPY;  Surgeon: [name], MD;  Location: [facility] OR;  Service: Thoracic;  Laterality: Left;   Family History  Problem Relation Age of Onset  . Stroke Mother   . Hypertension Mother   . Kidney failure Father    Current Outpatient Prescriptions  Medication Sig Dispense Refill  . aspirin EC 81 MG tablet Take 81 mg by mouth daily.    . diazepam (VALIUM) 5 MG tablet Take 5 mg by mouth 2 (two) times daily. Anxiety    . diltiazem (DILT-XR) 180 MG 24 hr capsule Take 1 capsule (180 mg total) by mouth daily. 30 capsule 1  . fish oil-omega-3 fatty acids 1000 MG capsule Take 1 g by mouth daily.     [name] HYDROcodone-acetaminophen (NORCO/VICODIN) 5-325 MG tablet Take 1 tablet by mouth every 6 (six) hours as needed for moderate pain.    . hydrOXYzine  (ATARAX/VISTARIL) 25 MG tablet Take a half to one pill at night for itching    . metFORMIN (GLUCOPHAGE) 500 MG tablet Take 500 mg by mouth daily with breakfast.     . metoprolol tartrate (LOPRESSOR) 25 MG tablet Take 0.5 tablets (12.5 mg total) by mouth 2 (two) times daily. 30 tablet 1  . naphazoline-glycerin (CLEAR EYES) 0.012-0.2 % SOLN Place 1-2 drops into both eyes 4 (four) times daily as needed (for itchy eyes).    . NONFORMULARY OR COMPOUNDED ITEM [facility]:  Combo Pain #1 - Baclofen 3%, Gabapentin 8%, Aripiprazole 2.3%, Ketorolac 8%, Licocaine 5%, dispense 240grams, apply 1-2 pumps to focal pain area 3-4 times daily, rub in 1-2 minutes, +3Refills (Patient taking differently: Apply 1 application topically 3 (three) times daily as needed (for foot pain.). [facility]:  Combo Pain #1 - Baclofen 3%, Gabapentin 8%, Aripiprazole 5.5%, Ketorolac 8%, Licocaine 5%, dispense 240grams, apply 1-2 pumps to focal pain area 3-4 times daily, rub in 1-2 minutes, +3Refills) 240 each 3  . ondansetron (ZOFRAN) 4 MG tablet Take 4 mg by mouth.    . pantoprazole (PROTONIX) 40 MG tablet Take 40 mg by mouth 2 (two) times daily.     . pravastatin (PRAVACHOL) 40 MG tablet Take 40 mg by mouth daily.     No current facility-administered medications for this visit.    Allergies  Allergen Reactions  . No Known Allergies Other (See Comments)   Social History   Social History  . Marital status: Widowed    Spouse name: N/A  . Number of children: N/A  . Years of education: N/A   Occupational History  . Not on file.   Social History Main Topics  . Smoking status: Former Smoker    Packs/day: 1.50    Years: 56.00    Types: Cigarettes    Quit date: 07/07/2013  . Smokeless tobacco: Former Systems developer    Quit date: 02/25/2013  . Alcohol use No  . Drug use: No  . Sexual activity: No   Other Topics Concern  . Not on file   Social History Narrative  . No narrative on file    Review  of Systems Full 14 system review of systems performed and notable only for those listed, all others are neg:  Constitutional:   Cardiovascular:  Ear/Nose/Throat:   Skin:  Eyes:   Respiratory:  wheezing Gastroitestinal:   Genitourinary:  Hematology/Lymphatic:   Endocrine:  Musculoskeletal:   Allergy/Immunology:   Neurological:  Headache, dizziness Psychiatric:  Sleep:    Physical Exam  Vitals:   06/19/16 1414  BP: 129/71  Temp: (!) 91 F (32.8 C)    General - Well nourished, well developed, in no apparent distress.  Ophthalmologic - fundi not visualized due to photophobia.  Cardiovascular - Regular rate and rhythm with no murmur.   Neck - supple, no nuchal rigidity, severe occipital neuralgia bilaterally.   Mental Status -  Level of arousal and orientation to time, place, and person were intact. Language including expression, naming, repetition, comprehension, reading, and writing was assessed and found intact. Fund of Knowledge was assessed and was intact.  Cranial Nerves II - XII - II - Visual field intact OU. III, IV, VI - Extraocular movements intact. V - Facial sensation intact bilaterally. VII - left nasolabial fold flattening, this is her baseline as per patient. VIII - Hearing & vestibular intact bilaterally, no nystagmus. X - Palate elevates symmetrically. XI - Chin turning & shoulder shrug intact bilaterally. XII - Tongue protrusion intact.  Motor Strength - The patient's strength was normal in all extremities and pronator drift was absent.  Bulk was normal and fasciculations were absent.   Motor Tone - Muscle tone was assessed at the neck and appendages and was normal.  Reflexes - The patient's reflexes were normal in all extremities and she had no pathological reflexes.  Sensory - Light touch, temperature/pinprick were assessed and were normal.    Coordination - The patient had normal movements in the hands with no ataxia or dysmetria.  Tremor was  absent.  Gait and Station - slow speed, and small stride, but steady without tendency to fall.    Imaging  I have personally reviewed the radiological images below and agree with the radiology interpretations.  CT HEAD Prominent small vessel disease changes without CT evidence of large acute infarct. Small or medium size infarct would be difficult to exclude given the degree white matter changes. Global atrophy without hydrocephalus.  CTA NECK 3 vessel arch with calcified plaque. Aortic atherosclerosis. Plaque with moderate to marked narrowing proximal left subclavian artery, moderate narrowing proximal innominate and proximal left common carotid artery. Moderate narrowing proximal right subclavian artery. Moderate to mark narrowing proximal right common carotid artery. Post right carotid endarterectomy. Plaque with mild narrowing distal right common carotid artery. No significant stenosis right carotid bifurcation. Plaque distal right internal carotid artery with moderate to marked narrowing proximal to the skullbase. Moderate narrowing origin of the left common carotid artery. Plaque with mild to moderate narrowing mid to distal left common carotid artery. Plaque with 76% diameter stenosis proximal left internal carotid artery. Ectatic proximal vertebral arteries without high-grade stenosis. Several thyroid lesions measuring up to 3.1 cm. Thyroid ultrasound recommended for further delineation.  CTA HEAD  Calcified plaque with mild to slightly mild narrowing cavernous segment internal carotid artery bilaterally. Fetal contribution to the posterior cerebral artery bilaterally. Mild narrowing distal vertebral arteries. Mild narrowing basilar artery. Mild narrowing portions of the posterior cerebral arteries bilaterally.  12/26/15 CUS -  60-79% left recurrent carotid stenosis.  An 1-39 percent right carotid stenosis.  Both vertebral arteries are antegrade  Lab Review 06/21/15 LDL 75  and TG 217    Assessment:   In summary, KIAH VANALSTINE is a 76 y.o. female with PMH of HTN, HLD, PVD s/p stents in LEs, carotid stenosis s/p b/l CEAs had episode of transient double vision and vertigo on 10/12/15 with neck movement. Continue to have lightheadedness and posterior headache. Her recent CUS indicating progressive left ICA re-stenosis and left VA retrograde flow. Recent CTA head  and neck showed left ICA 75% stenosis, right ICA distal stenosis, and left subclavian artery origin severe stenosis. Taken together, it is consistent with subclavian steal syndrome. In addition, patient has worsening symptoms with neck moment, need to consider bow hunter syndrome. Patient BP low at home, today 92/56, likely BP medication related. She needs higher BP goal for any great cerebral perfusion due to multiple vessel stenosis. Will discontinue lisinopril and HCTZ. Had urgent [facility] consultation and received left subclavian artery stenting. Continue to follow up with [facility] and repeat CUS showed left ICA 60-79% stenosis. MRI brain not done due to metal stent in body. BP improved after medication adjustment. However, patient continues to have bilateral occipital neuralgia. Received occipital nerve block x 2 and free of occipital neuralgia since the second shot. She had left lung cancer surgery in 01/2016 and underwent pain management for left chest pain. Currently came in again for occipital neuralgia bilaterally, will need occipital nerve block again. If HA not resolve, may consider MRI brain with and without contrast to rule out metastasis. If occpital neuralgia recurs, will consider radioablation by pain management.   Plan: - continue ASA and pravastatin for stroke prevention - check BP at home and record  - follow up with Dr. Trula Slade and Dr. Gwenlyn Found for carotid stenosis.  - Follow up with your primary care physician for stroke risk factor modification. Recommend maintain blood pressure goal 130-150/80, diabetes with  hemoglobin A1c goal below 7.0% and lipids with LDL cholesterol goal below 70 mg/dL.  - If HA not resolve, may consider MRI brain with and without contrast to rule out metastasis. - If occpital neuralgia recurs, will consider radioablation by pain management.  - follow up in 4 months   I spent more than 25 minutes of face to face time with the patient. Greater than 50% of time was spent in counseling and coordination of care. We discussed occipital neuralgia treatment, potential radioablation or MRI brain, and follow up with ENT.    No orders of the defined types were placed in this encounter.   Meds ordered this encounter  Medications  . DISCONTD: HYDROcodone-acetaminophen (NORCO/VICODIN) 5-325 MG tablet    Sig: Take by mouth.  . hydrOXYzine (ATARAX/VISTARIL) 25 MG tablet    Sig: Take a half to one pill at night for itching  . ondansetron (ZOFRAN) 4 MG tablet    Sig: Take 4 mg by mouth.    Patient Instructions  - continue ASA and pravastatin for stroke prevention - check BP at home and record  - follow up with Dr. Trula Slade and Dr. Gwenlyn Found for carotid stenosis.  - Follow up with your primary care physician for stroke risk factor modification. Recommend maintain blood pressure goal 130-150/80, diabetes with hemoglobin A1c goal below 7.0% and lipids with LDL cholesterol goal below 70 mg/dL.  - consider occipital nerve radioablation if occipital neuralgia recurrence - follow up in 4 months    Rosalin Hawking, MD PhD [facility] [address], [place], [place] [address] [phone]

## 2016-06-19 NOTE — Telephone Encounter (Addendum)
Called pt and she stated that as soon as she got home, her HA came back with full force as before the clinic visit. I told her that she should limited her activity especially neck movement for today and to see if she feels better. At meantime, I will order MRI brain with and without contrast to rule out brain structural lesion especially she has lung cancer under treatment now. I also ask her to call her pain management to consider radiofrequency ablation of bilateral occipital nerves. She is going to call today or tomorrow. She expressed understanding and appreciation.   Rosalin Hawking, MD PhD Stroke Neurology 06/19/2016 4:54 PM  Orders Placed This Encounter  Procedures  . MR BRAIN W WO CONTRAST    Standing Status:   Future    Standing Expiration Date:   08/19/2017    Order Specific Question:   If indicated for the ordered procedure, I authorize the administration of contrast media per Radiology protocol    Answer:   Yes    Order Specific Question:   Reason for Exam (SYMPTOM  OR DIAGNOSIS REQUIRED)    Answer:   headache in pt with lung cancer    Order Specific Question:   What is the patient's sedation requirement?    Answer:   No Sedation    Order Specific Question:   Does the patient have a pacemaker or implanted devices?    Answer:   Yes    Order Specific Question:   Manufacturer of pacemake or implanted device?    Answer:   left subclavian artery stent    Order Specific Question:   Preferred imaging location?    Answer:   Internal    Order Specific Question:   Radiology Contrast Protocol will attach to exam    Answer:   [email or web address]

## 2016-06-19 NOTE — Telephone Encounter (Signed)
Message sent to Dr.Xu.

## 2016-06-19 NOTE — Addendum Note (Signed)
Addended by: Rosalin Hawking on: 06/19/2016 04:57 PM   Modules accepted: Orders

## 2016-06-19 NOTE — Telephone Encounter (Signed)
Patient just saw Dr. Erlinda Hong and states her headache has returned.

## 2016-06-19 NOTE — Progress Notes (Signed)
Procedure Date:  06/19/16 Procedure: Occipital Nerve Block, bi  Pre-procedure Diagnosis: Headache  Post-procedure Diagnosis: same as above  Prior to Procedure:  Informed Consent: The risks, benefits, indications, potential complications, and alternatives were explained to the patient/family and informed consent obtained.  Attending Staff:  RN Resident/Fellow: none Skin Prep: Cleansed with alcohol.  Anesthesia: 47m Lidocaine 1% without epinephrine without added sodium bicarbonate, decadron 141m(22m1mIndications: bilateral occipital neuralgia, recurrent.  Description of Procedure: Pt's skin was prepped with alcohol and lidocaine 1% and decadron 22mg57m was injected over the occipital ridge in a fan-like fashion with appropriate procedure bilaterally. Pt had immediate relief.  Findings: immediate HA relief  Complications: The patient tolerated the procedure well with no complications.  Specimens:none  Estimated blood loss: zero   Caitlin Hawking PhD Stroke Neurology 06/19/2016 3:32 PM

## 2016-06-21 ENCOUNTER — Telehealth: Payer: Self-pay | Admitting: Neurology

## 2016-06-21 NOTE — Telephone Encounter (Signed)
Sure. I will do it. Please remind me. Thanks.   Rosalin Hawking, MD PhD Stroke Neurology 06/21/2016 8:16 PM

## 2016-06-21 NOTE — Telephone Encounter (Signed)
Due to patient insurance health team it takes about 14 business days to get a authorization.. patient is aware of this. I scheduled her MRI for [facility] on 07/11/16 at 9:30 AM.. She informed me that she is claustrophic and will need something to calm her nerves.

## 2016-06-21 NOTE — Telephone Encounter (Signed)
PT will be given a xanax rx for anxiety when Dr. Erlinda Hong returns to the office on 07/02/2016.

## 2016-06-22 ENCOUNTER — Telehealth: Payer: Self-pay | Admitting: Neurology

## 2016-06-22 NOTE — Telephone Encounter (Signed)
Patient called office in reference to occipital nerve radioablation.  Patient contacted her pain provider to see about scheduling it, but they advised patient her neurologist will need to order.  Please call

## 2016-06-25 NOTE — Telephone Encounter (Signed)
Dr. Erlinda Hong do we need a referral for occipital nerve radioablation. I thought patient was already at the pain clinic. I dont know how to order.Do you know what patient is talking about. Please advise thanks.

## 2016-06-25 NOTE — Telephone Encounter (Signed)
Good questions. I never ordered the test. She does not need pain management referral as she has her pain management doctor. I thought the pain management physician will do it. I have to ask around. Could you please also ask Dr. Jaynee Eagles and Dr. Leta Baptist? Thanks.   Rosalin Hawking, MD PhD Stroke Neurology 06/25/2016 6:24 PM

## 2016-06-26 NOTE — Telephone Encounter (Signed)
RN call patient about her pain doctor request for a referral. Rn ask patient about pain doctors office and name. Pt did not know who she saw last year for pain management. PT stated she was seen by oncology for her lung procedure for pain. Rn ask patient does her pain md do occipital nerve radioblation. Pt stated she did not know. PT stated she does not have pain doctor she sees regularly. Rn ask patient for the number of her oncologist who manage her pain. Rn was given the number of [phone]. Rn will call for clarification.

## 2016-06-26 NOTE — Telephone Encounter (Signed)
Rn call number provider by patient of [phone]. Rn spoke with Rosie at the cancer center. Alison Murray was given the patients demographic information. She did confirm patient was seen last year for pain management for her cancer/lung management. They stated she was seen only for pain management of her lung procedure. Rn ask if they did occipital nerve radioablation.They stated their office does not do occipital nerve radioablation. Message will be sent to Dr. Erlinda Hong about doing referral to pain management for occipital nerve radioablation.Pt had 3 occipital nerve blocks.

## 2016-06-27 ENCOUNTER — Other Ambulatory Visit: Payer: Self-pay

## 2016-06-27 ENCOUNTER — Other Ambulatory Visit: Payer: Self-pay | Admitting: Neurology

## 2016-06-27 DIAGNOSIS — M5481 Occipital neuralgia: Secondary | ICD-10-CM

## 2016-06-27 NOTE — Telephone Encounter (Signed)
Referral put in for pain clinic for occipital nerve radioablation for patient.

## 2016-06-27 NOTE — Telephone Encounter (Signed)
Hi, Katrina:  I can not find pain management orders in epic. Please ask around about how to order pain management consult, and I will do it. Thanks.   Rosalin Hawking, MD PhD Stroke Neurology 06/27/2016 11:03 AM

## 2016-06-28 NOTE — Telephone Encounter (Signed)
Pt called office to see status of pain clinic referral, patient advised Caitlin Oliver that does our out going referrals is out of the office and referral has not been sent out.  Also nurse is waiting on a response to be sure [facility] does occipital nerve radioablation. Pt voiced understanding.

## 2016-06-28 NOTE — Telephone Encounter (Signed)
Left 2nd vm for Robin new pt referral coordinator about fax number and if they do occipital nerve radioablation.

## 2016-06-28 NOTE — Telephone Encounter (Signed)
Left vm for Robin at [phone] new pt referral coordinator at [facility] pain clinic about if they do occipital nerve radioablation.

## 2016-06-29 NOTE — Telephone Encounter (Signed)
Referral sent to Attention Robin at [facility] pain clinic.

## 2016-06-29 NOTE — Telephone Encounter (Signed)
Rn call [facility] pain clinic and spoke with the scheduler. Rn stated two messages were left with Robin new referral coordinator. They stated she was in clinic yesterday. Rn ask if they treat and evaluate occipital nerve radioablation.The office stated they do treat ONR. Rn was given the fax number of [phone] for Attention: Robin.

## 2016-06-29 NOTE — Telephone Encounter (Signed)
I called the pain clinic and tried two different extensions. There was no answer. I called the patient to let her know we have sent the referral over to [facility]. I gave her their phone number.

## 2016-07-02 DIAGNOSIS — I10 Essential (primary) hypertension: Secondary | ICD-10-CM | POA: Diagnosis not present

## 2016-07-02 DIAGNOSIS — F419 Anxiety disorder, unspecified: Secondary | ICD-10-CM | POA: Diagnosis not present

## 2016-07-02 DIAGNOSIS — R252 Cramp and spasm: Secondary | ICD-10-CM | POA: Diagnosis not present

## 2016-07-02 NOTE — Telephone Encounter (Signed)
Rn call Shirlean Mylar again about referral being sent on Friday. Shirlean Mylar stated she did get referral from our office. Rn ask if found out if the occipital nerve radioablation.Shirlean Mylar stated she sent a e mail  to her supervisor but has not heard anything yet.Rn stated that we dont want the pt to wait a long time for an answer. Rn ask Shirlean Mylar if she know of any other pain clinics who do the occipital nerve radioablation. Shirlean Mylar stated she did not know of any. Rn requested a call be made back to [place] asap if they do the procedure.

## 2016-07-03 NOTE — Telephone Encounter (Signed)
Rn call Shirlean Mylar again in referrals about the occipital nerve radioblation procedure and pain management. Shirlean Mylar stated she does have the referral but the MD was not in the office yesterday. An email was sent to the MD about if they can do the procedure for headaches. Shirlean Mylar stated its done for shoulders. Shirlean Mylar stated she will call us back when the MD response. Rn stated to Shirlean Mylar if there is no response by Wednesday from the MD, the pt may have to be referred to another MD. Shirlean Mylar stated she will call us back. Rn will be waiting on call from Shirlean Mylar, if there is no response or they dont do the procedure. Message will be sent to Dr. Erlinda Hong about another pain clinic referral.  Shirlean Mylar stated she did get 32 pages of the referral that was fax on 06/29/2016.

## 2016-07-05 NOTE — Telephone Encounter (Signed)
I have called Patient and left her a message explaining details.  Dr. Maryjean Ka [phone]  at [facility] does Occipital nerve radioablation  I have spoke to Dr. Maryjean Ka scheduler . Dr. Maryjean Ka should be able to get patient in with in 7 days. Thanks Hinton Dyer.

## 2016-07-05 NOTE — Telephone Encounter (Signed)
Thank you so much, Hinton Dyer, for your help. This is wonderful news for pt.  Rosalin Hawking, MD PhD Stroke Neurology 07/05/2016 2:03 PM

## 2016-07-05 NOTE — Telephone Encounter (Signed)
Message sent to [facility] in referrals for patient to be sent to different pain clinic.

## 2016-07-11 ENCOUNTER — Ambulatory Visit (HOSPITAL_COMMUNITY)
Admission: RE | Admit: 2016-07-11 | Discharge: 2016-07-11 | Disposition: A | Discharge status: Home / Self Care | Payer: PPO | Source: Ambulatory Visit | Attending: Neurology | Admitting: Neurology

## 2016-07-11 DIAGNOSIS — I639 Cerebral infarction, unspecified: Secondary | ICD-10-CM | POA: Insufficient documentation

## 2016-07-11 DIAGNOSIS — C3490 Malignant neoplasm of unspecified part of unspecified bronchus or lung: Secondary | ICD-10-CM | POA: Insufficient documentation

## 2016-07-11 DIAGNOSIS — R51 Headache: Secondary | ICD-10-CM | POA: Diagnosis not present

## 2016-07-11 DIAGNOSIS — G8929 Other chronic pain: Secondary | ICD-10-CM

## 2016-07-11 DIAGNOSIS — R519 Headache, unspecified: Secondary | ICD-10-CM

## 2016-07-11 LAB — CREATININE, SERUM
Creatinine, Ser: 0.84 mg/dL (ref 0.44–1.00)
GFR calc Af Amer: >60 mL/min (ref >60)
GFR calc non Af Amer: >60 mL/min (ref >60)

## 2016-07-11 MED ORDER — GADOBENATE DIMEGLUMINE 529 MG/ML IV SOLN
18.0000 mL | Freq: Once | INTRAVENOUS | Status: AC | PRN
  Administered 2016-07-11: 18 mL via INTRAVENOUS

## 2016-07-13 DIAGNOSIS — H8110 Benign paroxysmal vertigo, unspecified ear: Secondary | ICD-10-CM | POA: Diagnosis not present

## 2016-07-13 DIAGNOSIS — H6123 Impacted cerumen, bilateral: Secondary | ICD-10-CM | POA: Diagnosis not present

## 2016-07-13 DIAGNOSIS — H9193 Unspecified hearing loss, bilateral: Secondary | ICD-10-CM | POA: Diagnosis not present

## 2016-07-23 DIAGNOSIS — I10 Essential (primary) hypertension: Secondary | ICD-10-CM | POA: Diagnosis not present

## 2016-07-23 DIAGNOSIS — R0789 Other chest pain: Secondary | ICD-10-CM | POA: Diagnosis not present

## 2016-07-23 DIAGNOSIS — F419 Anxiety disorder, unspecified: Secondary | ICD-10-CM | POA: Diagnosis not present

## 2016-07-23 DIAGNOSIS — K219 Gastro-esophageal reflux disease without esophagitis: Secondary | ICD-10-CM | POA: Diagnosis not present

## 2016-07-27 ENCOUNTER — Telehealth: Payer: Self-pay | Admitting: Internal Medicine

## 2016-07-27 NOTE — Telephone Encounter (Signed)
Scheduled lab appt per sch message from [place] - left message with lab appt time and date. Sent reminder letter in the mail .

## 2016-07-30 DIAGNOSIS — M47812 Spondylosis without myelopathy or radiculopathy, cervical region: Secondary | ICD-10-CM | POA: Diagnosis not present

## 2016-07-30 DIAGNOSIS — R51 Headache: Secondary | ICD-10-CM | POA: Diagnosis not present

## 2016-07-30 DIAGNOSIS — M5481 Occipital neuralgia: Secondary | ICD-10-CM | POA: Diagnosis not present

## 2016-08-01 DIAGNOSIS — R42 Dizziness and giddiness: Secondary | ICD-10-CM | POA: Diagnosis not present

## 2016-08-01 DIAGNOSIS — H903 Sensorineural hearing loss, bilateral: Secondary | ICD-10-CM | POA: Diagnosis not present

## 2016-08-06 DIAGNOSIS — R51 Headache: Secondary | ICD-10-CM | POA: Diagnosis not present

## 2016-08-06 DIAGNOSIS — M47812 Spondylosis without myelopathy or radiculopathy, cervical region: Secondary | ICD-10-CM | POA: Diagnosis not present

## 2016-08-06 DIAGNOSIS — M5481 Occipital neuralgia: Secondary | ICD-10-CM | POA: Diagnosis not present

## 2016-08-20 ENCOUNTER — Ambulatory Visit: Payer: PPO | Attending: Otolaryngology | Admitting: Physical Therapy

## 2016-08-20 DIAGNOSIS — R2681 Unsteadiness on feet: Secondary | ICD-10-CM | POA: Diagnosis not present

## 2016-08-20 DIAGNOSIS — H8111 Benign paroxysmal vertigo, right ear: Secondary | ICD-10-CM | POA: Diagnosis not present

## 2016-08-21 NOTE — Patient Instructions (Signed)

## 2016-08-21 NOTE — Therapy (Signed)
[facility] [address] [place], [place], [address] Phone: [phone]   Fax:  [phone]  Physical Therapy Evaluation  Patient Details  Name: Caitlin Oliver MRN: [id number] Date of Birth: 05/19/1940 Referring Provider: Dr. Jerrell Belfast  Encounter Date: 08/20/2016      PT End of Session - 08/21/16 0900    Visit Number 1   Number of Visits 4   Date for PT Re-Evaluation 09/20/16   Authorization Type [employer]   Authorization Time Period 08-20-16 - 10-19-16   PT Start Time 0867   PT Stop Time 1447   PT Time Calculation (min) 44 min      Past Medical History:  Diagnosis Date  . Anxiety   . Carotid artery disease ([place])   . Claudication ([place])   . COPD (chronic obstructive pulmonary disease) ([place])   . Diabetes mellitus   . Dizzy   . GERD (gastroesophageal reflux disease)   . Headache   . History of kidney stones   . Hypercholesteremia   . Hypertension   . 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 Shortness of breath    with exertion  . Stomach ulcer   . Tobacco abuse     Past Surgical History:  Procedure Laterality Date  . ABDOMINAL HYSTERECTOMY    . APPENDECTOMY    . BREAST REDUCTION SURGERY    . CAROTID ANGIOGRAM N/A 02/25/2013   Procedure: CAROTID ANGIOGRAM;  Surgeon: Lorretta Harp, MD;  Location: [facility] CATH LAB;  Service: Cardiovascular;  Laterality: N/A;  . ENDARTERECTOMY Right 03/06/2013   Procedure: ENDARTERECTOMY CAROTID-RIGHT;  Surgeon: Serafina [place], MD;  Location: [facility];  Service: Vascular;  Laterality: Right;  . ENDARTERECTOMY Left 05/07/2013   Procedure: LEFT CAROTID ARTERY ENDARTERECTOMY WITH VASCU-GUARD PATCH ANGIOPLASTY ;  Surgeon: Serafina [place], MD;  Location: [facility];  Service: Vascular;  Laterality: Left;  . LOBECTOMY Left 02/03/2016   Procedure: LEFT UPPER LOBECTOMY;  Surgeon: [name], MD;  Location: [facility];   Service: Thoracic;  Laterality: Left;  . LOWER EXTREMITY ANGIOGRAM N/A 02/25/2013   Procedure: LOWER EXTREMITY ANGIOGRAM;  Surgeon: Lorretta Harp, MD;  Location: [facility] CATH LAB;  Service: Cardiovascular;  Laterality: N/A;  . LOWER EXTREMITY ANGIOGRAM N/A 07/27/2013   Procedure: LOWER EXTREMITY ANGIOGRAM;  Surgeon: Lorretta Harp, MD;  Location: [facility] CATH LAB;  Service: Cardiovascular;  Laterality: N/A;  . PATCH ANGIOPLASTY Right 03/06/2013   Procedure: PATCH ANGIOPLASTY of Right Carotid Artery using Vascu-Guard Patch;  Surgeon: Serafina [place], MD;  Location: [facility];  Service: Vascular;  Laterality: Right;  . PERIPHERAL VASCULAR CATHETERIZATION N/A 11/15/2015   Procedure: Aortic Arch Angiography;  Surgeon: Serafina [place], MD;  Location: [facility];  Service: Cardiovascular;  Laterality: N/A;  . PERIPHERAL VASCULAR CATHETERIZATION Bilateral 11/15/2015   Procedure: Carotid Angiography;  Surgeon: Serafina [place], MD;  Location: [facility] CV LAB;  Service: Cardiovascular;  Laterality: Bilateral;  . PERIPHERAL VASCULAR CATHETERIZATION Left 11/15/2015   Procedure: Upper Extremity Angiography;  Surgeon: Serafina [place], MD;  Location: [facility] CV LAB;  Service: Cardiovascular;  Laterality: Left;  . PERIPHERAL VASCULAR CATHETERIZATION Left 11/15/2015   Procedure: Peripheral Vascular Intervention;  Surgeon: Serafina [place], MD;  Location: [facility] CV LAB;  Service: Cardiovascular;  Laterality: Left;  subclavian   . SALIVARY GLAND SURGERY     scar tissue removed from left saliva glad  .  TUBAL LIGATION    . VIDEO ASSISTED THORACOSCOPY (VATS)/WEDGE RESECTION Left 02/03/2016   Procedure: VIDEO ASSISTED THORACOSCOPY;  Surgeon: [name], MD;  Location: [facility];  Service: Thoracic;  Laterality: Left;    There were no vitals filed for this visit.       Subjective Assessment - 08/21/16 0840    Subjective Pt reports problem with vertigo started about 6 months ago;  pt reports vertigo was  really bad last night; sleeps on her right side and had vertigo when she laid down; pt reports she has nausea accompanying the vertigo   Pertinent History Migraines   Patient Stated Goals resolve the vertigo   Currently in Pain? Yes   Pain Score 4    Pain Location Head   Pain Orientation Right;Left;Anterior   Pain Descriptors / Indicators Aching   Pain Type Chronic pain   Pain Onset More than a month ago   Pain Frequency Intermittent   Aggravating Factors  moving head "the wrong way"            [facility] PT Assessment - 08/21/16 0001      Assessment   Medical Diagnosis Vertigo    Referring Provider Dr. Jerrell Belfast   Onset Date/Surgical Date --  approx. 6 months ago     Precautions   Precautions Other (comment)  Vertigo     Balance Screen   Has the patient fallen in the past 6 months No   Has the patient had a decrease in activity level because of a fear of falling?  No   Is the patient reluctant to leave their home because of a fear of falling?  No            Vestibular Assessment - 08/21/16 0001      Vestibular Assessment   General Observation Pt is a 76 year old lady with c/o vertigo that has been occurring intermittently within past 6 months.  Pt saw Dr. Wilburn Cornelia and then audiologist  who diagnosed her with Rt. horizontal canal BPPV.  Pt states vertigo was bad on Sunday night when she lied down to go to bed.     Symptom Behavior   Type of Dizziness Spinning   Frequency of Dizziness intermittently   Duration of Dizziness secs to minutes   Aggravating Factors Looking up to the ceiling;Rolling to right;Lying supine   Relieving Factors Comments;Head stationary     Occulomotor Exam   Occulomotor Alignment Normal   Spontaneous Absent   Smooth Pursuits Intact     Positional Testing   Dix-Hallpike Dix-Hallpike Right;Dix-Hallpike Left   Sidelying Test Sidelying Left;Sidelying Right   Horizontal Canal Testing Horizontal Canal Right;Horizontal Canal Left      Dix-Hallpike Right   Dix-Hallpike Right Duration 5-6 secs   Dix-Hallpike Right Symptoms Upbeat, right rotatory nystagmus     Dix-Hallpike Left   Dix-Hallpike Left Duration none   Dix-Hallpike Left Symptoms No nystagmus     Sidelying Right   Sidelying Right Duration 2-3 secs   Sidelying Right Symptoms No nystagmus     Sidelying Left   Sidelying Left Duration none   Sidelying Left Symptoms No nystagmus     Horizontal Canal Right   Horizontal Canal Right Duration none   Horizontal Canal Right Symptoms Normal     Horizontal Canal Left   Horizontal Canal Left Duration none   Horizontal Canal Left Symptoms Normal       Canalith Repositioning maneuver performed for Rt posterior canalithiasis - 3 reps;  symptoms appeared to be almost fully resolved  On 3rd rep  [place];  Educated pt in etiology of Rt BPPV - posterior canalithiasis; no horizontal nystagmus noted with any positional testing              PT Education - 08/21/16 0859    Education provided Yes   Education Details etiology of Rt BPPV   Person(s) Educated Patient   Methods Explanation;Demonstration;Handout   Comprehension Verbalized understanding             PT Long Term Goals - 08/21/16 0907      PT LONG TERM GOAL #1   Title Pt will have a (-) Rt Dix-Hallpike test to indicate that Rt BPPV has resolved.  09-20-16   Time 4   Period Weeks   Status New     PT LONG TERM GOAL #2   Title Pt will report at least 75% improvement in vertigo.  09-20-16   Time 4   Period Weeks   Status New     PT LONG TERM GOAL #3   Title Independent in Brandt-Daroff exercises prn.  09-20-16   Time 4   Period Weeks   Status New               Plan - 08/21/16 0901    Clinical Impression Statement Pt is a 76 yr old lady with c/o intermittent vertigo that has been occurring within past 6 months.  Pt has a (+) Rt Dix-Hallpike test with Rt rotary upbeating nystagmus, indicative of Rt posterior canalithiasis.   Horizontal roll tests (-) Rt and Lt sides.  Epley maneuver performed 3 reps with pt reporting significant improvement in symptoms on 3rd rep of maneuver.  PMH includes PAD, COPD, anxiety, claudication, DM and headaches.  Moderate complexity decision making required in POC.                                                               Rehab Potential Good   PT Frequency 1x / week   PT Duration 4 weeks   PT Treatment/Interventions ADLs/Self Care Home Management;Canalith Repostioning;Functional mobility training;Stair training;Gait training;Therapeutic activities;Therapeutic exercise;Balance training;Neuromuscular re-education;Vestibular   PT Next Visit Plan recheck Rt Dix-Hallpike:  Epley prn   PT Home Exercise Plan Brandt-daroff if Rt BPPV not resolved   Consulted and Agree with Plan of Care Patient      Patient will benefit from skilled therapeutic intervention in order to improve the following deficits and impairments:  Dizziness, Difficulty walking, Decreased balance  Visit Diagnosis: BPPV (benign paroxysmal positional vertigo), right - Plan: PT plan of care cert/re-cert  Unsteadiness on feet - Plan: PT plan of care cert/re-cert      G-Codes - 94/70/96 0912    Functional Assessment Tool Used (Outpatient Only) (+) Rt Dix-Hallpike test   Functional Limitation Changing and maintaining body position   Changing and Maintaining Body Position Current Status (G8366) At least 60 percent but less than 80 percent impaired, limited or restricted   Changing and Maintaining Body Position Goal Status (Q9476) At least 1 percent but less than 20 percent impaired, limited or restricted       Problem List Patient Active Problem List   Diagnosis Date Noted  . Headache 06/19/2016  . Cancer of left lung (  [place]) 02/03/2016  . COPD GOLD II 12/08/2015  . Solitary pulmonary nodule 12/08/2015  . Bilateral occipital neuralgia 11/22/2015  . Double vision 10/28/2015  . TIA (transient ischemic attack)  10/28/2015  . Subclavian artery stenosis, left ([place]) 10/28/2015  . Subclavian steal syndrome 10/28/2015  . Acute blood loss anemia 10/20/2013  . C. difficile colitis 10/19/2013  . GI bleed 10/18/2013  . COPD 10/18/2013  . Bleeding gastrointestinal 10/18/2013  . Hypoxia 10/10/2013  . Bronchitis 10/08/2013  . PVD (peripheral vascular disease) with claudication ([place]) 07/29/2013  . Carotid stenosis 05/07/2013  . Stenosis of left carotid artery 05/07/2013  . Occlusion and stenosis of carotid artery without mention of cerebral infarction 03/02/2013  . Carotid artery obstruction 03/02/2013  . Carotid artery disease ([place]) 02/27/2013  . Tobacco abuse 02/27/2013  . Compulsive tobacco user syndrome 02/27/2013  . Claudication ([place]) 01/20/2013  . Essential hypertension 01/20/2013  . Type 2 diabetes mellitus ([place]) 01/20/2013  . Hyperlipidemia 01/20/2013    Alda Lea, PT 08/21/2016, [facility] [address] [place], [place], [address] Phone: [phone]   Fax:  [phone]  Name: Caitlin Oliver MRN: [id number] Date of Birth: November 01, 1940

## 2016-08-28 ENCOUNTER — Encounter: Payer: PPO | Admitting: Physical Therapy

## 2016-09-03 ENCOUNTER — Encounter: Payer: PPO | Admitting: Physical Therapy

## 2016-09-11 DIAGNOSIS — M5481 Occipital neuralgia: Secondary | ICD-10-CM | POA: Diagnosis not present

## 2016-09-11 DIAGNOSIS — Z6831 Body mass index (BMI) 31.0-31.9, adult: Secondary | ICD-10-CM | POA: Diagnosis not present

## 2016-09-11 DIAGNOSIS — M47812 Spondylosis without myelopathy or radiculopathy, cervical region: Secondary | ICD-10-CM | POA: Diagnosis not present

## 2016-09-11 DIAGNOSIS — I10 Essential (primary) hypertension: Secondary | ICD-10-CM | POA: Diagnosis not present

## 2016-09-12 ENCOUNTER — Ambulatory Visit (HOSPITAL_COMMUNITY)
Admission: RE | Admit: 2016-09-12 | Discharge: 2016-09-12 | Disposition: A | Discharge status: Home / Self Care | Payer: PPO | Source: Ambulatory Visit | Attending: Internal Medicine | Admitting: Internal Medicine

## 2016-09-12 ENCOUNTER — Telehealth: Payer: Self-pay | Admitting: Internal Medicine

## 2016-09-12 ENCOUNTER — Ambulatory Visit (HOSPITAL_BASED_OUTPATIENT_CLINIC_OR_DEPARTMENT_OTHER): Payer: PPO | Admitting: Internal Medicine

## 2016-09-12 ENCOUNTER — Other Ambulatory Visit (HOSPITAL_BASED_OUTPATIENT_CLINIC_OR_DEPARTMENT_OTHER): Payer: PPO

## 2016-09-12 ENCOUNTER — Encounter (HOSPITAL_COMMUNITY): Payer: Self-pay

## 2016-09-12 ENCOUNTER — Encounter: Payer: Self-pay | Admitting: Internal Medicine

## 2016-09-12 VITALS — BP 155/87 | HR 77 | Temp 95.0°F | Resp 18 | Ht 64.0 in | Wt 183.3 lb

## 2016-09-12 DIAGNOSIS — E042 Nontoxic multinodular goiter: Secondary | ICD-10-CM | POA: Diagnosis not present

## 2016-09-12 DIAGNOSIS — J90 Pleural effusion, not elsewhere classified: Secondary | ICD-10-CM | POA: Diagnosis not present

## 2016-09-12 DIAGNOSIS — Z85118 Personal history of other malignant neoplasm of bronchus and lung: Secondary | ICD-10-CM

## 2016-09-12 DIAGNOSIS — I700 Atherosclerosis of aorta: Secondary | ICD-10-CM | POA: Diagnosis not present

## 2016-09-12 DIAGNOSIS — J439 Emphysema, unspecified: Secondary | ICD-10-CM | POA: Insufficient documentation

## 2016-09-12 DIAGNOSIS — C3412 Malignant neoplasm of upper lobe, left bronchus or lung: Secondary | ICD-10-CM

## 2016-09-12 DIAGNOSIS — I10 Essential (primary) hypertension: Secondary | ICD-10-CM

## 2016-09-12 DIAGNOSIS — I2510 Atherosclerotic heart disease of native coronary artery without angina pectoris: Secondary | ICD-10-CM | POA: Insufficient documentation

## 2016-09-12 DIAGNOSIS — C3432 Malignant neoplasm of lower lobe, left bronchus or lung: Secondary | ICD-10-CM

## 2016-09-12 DIAGNOSIS — Z902 Acquired absence of lung [part of]: Secondary | ICD-10-CM | POA: Insufficient documentation

## 2016-09-12 LAB — CBC WITH DIFFERENTIAL/PLATELET
BASO%: 0.6 % (ref 0.0–2.0)
BASOS ABS: 0.1 10*3/uL (ref 0.0–0.1)
EOS ABS: 0.2 10*3/uL (ref 0.0–0.5)
EOS%: 1.7 % (ref 0.0–7.0)
HEMATOCRIT: 47.5 % — AB (ref 34.8–46.6)
HEMOGLOBIN: 15.5 g/dL (ref 11.6–15.9)
LYMPH%: 20.7 % (ref 14.0–49.7)
MCH: 28.8 pg (ref 25.1–34.0)
MCHC: 32.6 g/dL (ref 31.5–36.0)
MCV: 88.3 fL (ref 79.5–101.0)
MONO#: 0.6 10*3/uL (ref 0.1–0.9)
MONO%: 7.4 % (ref 0.0–14.0)
NEUT#: 6.1 10*3/uL (ref 1.5–6.5)
NEUT%: 69.6 % (ref 38.4–76.8)
Platelets: 297 10*3/uL (ref 145–400)
RBC: 5.38 10*6/uL (ref 3.70–5.45)
RDW: 13.9 % (ref 11.2–14.5)
WBC: 8.7 10*3/uL (ref 3.9–10.3)
lymph#: 1.8 10*3/uL (ref 0.9–3.3)

## 2016-09-12 LAB — COMPREHENSIVE METABOLIC PANEL
ALT: 32 U/L (ref 0–55)
AST: 24 U/L (ref 5–34)
Albumin: 4.2 g/dL (ref 3.5–5.0)
Alkaline Phosphatase: 93 U/L (ref 40–150)
Anion Gap: 9 mEq/L (ref 3–11)
BILIRUBIN TOTAL: 0.48 mg/dL (ref 0.20–1.20)
BUN: 21.1 mg/dL (ref 7.0–26.0)
CHLORIDE: 103 meq/L (ref 98–109)
CO2: 30 meq/L — AB (ref 22–29)
CREATININE: 0.9 mg/dL (ref 0.6–1.1)
Calcium: 10.3 mg/dL (ref 8.4–10.4)
EGFR: 61 mL/min/{1.73_m2} — ABNORMAL LOW (ref >90)
GLUCOSE: 143 mg/dL — AB (ref 70–140)
Potassium: 4.9 mEq/L (ref 3.5–5.1)
SODIUM: 141 meq/L (ref 136–145)
TOTAL PROTEIN: 7.6 g/dL (ref 6.4–8.3)

## 2016-09-12 MED ORDER — IOPAMIDOL (ISOVUE-300) INJECTION 61%
INTRAVENOUS | Status: AC
  Administered 2016-09-12: 75 mL
  Filled 2016-09-12: qty 75

## 2016-09-12 NOTE — Telephone Encounter (Signed)
Scheduled appt per 5/30 LOS lab and f/u in 6 months with ct - per patient wants ct, lab, and f/u on same date. Central radiology to contact patient with ct schedule.

## 2016-09-12 NOTE — Progress Notes (Signed)
[place] [phone]) [phone]   Fax:([phone]) [phone]  OFFICE PROGRESS NOTE  [name], PA-C [address] [place] [address]  DIAGNOSIS: Stage IA (T1a, N0, M0) non-small cell lung cancer, squamous cell carcinoma status post left lower lobectomy with lymph node sampling in October 2017.  PRIOR THERAPY: Status post left lower lobectomy with lymph node sampling in October 2017.  CURRENT THERAPY: Observation.  INTERVAL HISTORY: Caitlin Oliver 76 y.o. female returns to the clinic today for follow-up visit. The patient is feeling fine today with no specific complaints. She denied having any chest pain, shortness of breath, cough or hemoptysis. She has no nausea, vomiting, diarrhea or constipation. She has no fever or chills. She had repeat CT scan of the chest performed recently and she is here for evaluation and discussion of her scan results.  MEDICAL HISTORY: Past Medical History:  Diagnosis Date  . Anxiety   . Carotid artery disease ([place])   . Claudication ([place])   . COPD (chronic obstructive pulmonary disease) ([place])   . Diabetes mellitus   . Dizzy   . GERD (gastroesophageal reflux disease)   . Headache   . History of kidney stones   . Hypercholesteremia   . Hypertension   . 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 Shortness of breath    with exertion  . Stomach ulcer   . Tobacco abuse     ALLERGIES:  is allergic to no known allergies.  MEDICATIONS:  Current Outpatient Prescriptions  Medication Sig Dispense Refill  . aspirin EC 81 MG tablet Take 81 mg by mouth daily.    . diazepam (VALIUM) 5 MG tablet Take 5 mg by mouth 2 (two) times daily. Anxiety    . diltiazem (DILT-XR) 180 MG 24 hr capsule Take 1 capsule (180 mg total) by mouth daily. 30 capsule 1  . fish oil-omega-3 fatty acids 1000 MG capsule Take 1 g by mouth daily.     [name] HYDROcodone-acetaminophen (NORCO/VICODIN) 5-325 MG tablet  Take 1 tablet by mouth every 6 (six) hours as needed for moderate pain.    . hydrOXYzine (ATARAX/VISTARIL) 25 MG tablet Take a half to one pill at night for itching    . metFORMIN (GLUCOPHAGE) 500 MG tablet Take 500 mg by mouth daily with breakfast.     . metoprolol tartrate (LOPRESSOR) 25 MG tablet Take 0.5 tablets (12.5 mg total) by mouth 2 (two) times daily. 30 tablet 1  . naphazoline-glycerin (CLEAR EYES) 0.012-0.2 % SOLN Place 1-2 drops into both eyes 4 (four) times daily as needed (for itchy eyes).    . NONFORMULARY OR COMPOUNDED ITEM [facility]:  Combo Pain #1 - Baclofen 3%, Gabapentin 8%, Aripiprazole 7.0%, Ketorolac 8%, Licocaine 5%, dispense 240grams, apply 1-2 pumps to focal pain area 3-4 times daily, rub in 1-2 minutes, +3Refills (Patient taking differently: Apply 1 application topically 3 (three) times daily as needed (for foot pain.). [facility]:  Combo Pain #1 - Baclofen 3%, Gabapentin 8%, Aripiprazole 4.8%, Ketorolac 8%, Licocaine 5%, dispense 240grams, apply 1-2 pumps to focal pain area 3-4 times daily, rub in 1-2 minutes, +3Refills) 240 each 3  . ondansetron (ZOFRAN) 4 MG tablet Take 4 mg by mouth.    . pantoprazole (PROTONIX) 40 MG tablet Take 40 mg by mouth 2 (two) times daily.     . pravastatin (PRAVACHOL) 40 MG tablet Take 40 mg by mouth daily.  No current facility-administered medications for this visit.     SURGICAL HISTORY:  Past Surgical History:  Procedure Laterality Date  . ABDOMINAL HYSTERECTOMY    . APPENDECTOMY    . BREAST REDUCTION SURGERY    . CAROTID ANGIOGRAM N/A 02/25/2013   Procedure: CAROTID ANGIOGRAM;  Surgeon: Lorretta Harp, MD;  Location: [facility] CATH LAB;  Service: Cardiovascular;  Laterality: N/A;  . ENDARTERECTOMY Right 03/06/2013   Procedure: ENDARTERECTOMY CAROTID-RIGHT;  Surgeon: Serafina Mitchell, MD;  Location: [facility];  Service: Vascular;  Laterality: Right;  . ENDARTERECTOMY Left 05/07/2013   Procedure: LEFT  CAROTID ARTERY ENDARTERECTOMY WITH VASCU-GUARD PATCH ANGIOPLASTY ;  Surgeon: Serafina Mitchell, MD;  Location: [facility];  Service: Vascular;  Laterality: Left;  . LOBECTOMY Left 02/03/2016   Procedure: LEFT UPPER LOBECTOMY;  Surgeon: [name], MD;  Location: [facility];  Service: Thoracic;  Laterality: Left;  . LOWER EXTREMITY ANGIOGRAM N/A 02/25/2013   Procedure: LOWER EXTREMITY ANGIOGRAM;  Surgeon: Lorretta Harp, MD;  Location: [facility] CATH LAB;  Service: Cardiovascular;  Laterality: N/A;  . LOWER EXTREMITY ANGIOGRAM N/A 07/27/2013   Procedure: LOWER EXTREMITY ANGIOGRAM;  Surgeon: Lorretta Harp, MD;  Location: [facility] CATH LAB;  Service: Cardiovascular;  Laterality: N/A;  . PATCH ANGIOPLASTY Right 03/06/2013   Procedure: PATCH ANGIOPLASTY of Right Carotid Artery using Vascu-Guard Patch;  Surgeon: Serafina Mitchell, MD;  Location: [facility];  Service: Vascular;  Laterality: Right;  . PERIPHERAL VASCULAR CATHETERIZATION N/A 11/15/2015   Procedure: Aortic Arch Angiography;  Surgeon: Serafina Mitchell, MD;  Location: [facility] CV LAB;  Service: Cardiovascular;  Laterality: N/A;  . PERIPHERAL VASCULAR CATHETERIZATION Bilateral 11/15/2015   Procedure: Carotid Angiography;  Surgeon: Serafina Mitchell, MD;  Location: [facility] CV LAB;  Service: Cardiovascular;  Laterality: Bilateral;  . PERIPHERAL VASCULAR CATHETERIZATION Left 11/15/2015   Procedure: Upper Extremity Angiography;  Surgeon: Serafina Mitchell, MD;  Location: [facility] CV LAB;  Service: Cardiovascular;  Laterality: Left;  . PERIPHERAL VASCULAR CATHETERIZATION Left 11/15/2015   Procedure: Peripheral Vascular Intervention;  Surgeon: Serafina Mitchell, MD;  Location: [facility];  Service: Cardiovascular;  Laterality: Left;  subclavian   . SALIVARY GLAND SURGERY     scar tissue removed from left saliva glad  . TUBAL LIGATION    . VIDEO ASSISTED THORACOSCOPY (VATS)/WEDGE RESECTION Left 02/03/2016   Procedure: VIDEO ASSISTED THORACOSCOPY;  Surgeon: [name], MD;  Location: [facility];  Service: Thoracic;  Laterality: Left;    REVIEW OF SYSTEMS:  A comprehensive review of systems was negative.   PHYSICAL EXAMINATION: General appearance: alert, cooperative, fatigued and no distress Head: Normocephalic, without obvious abnormality, atraumatic Neck: no adenopathy, no JVD, supple, symmetrical, trachea midline and thyroid not enlarged, symmetric, no tenderness/mass/nodules Lymph nodes: Cervical, supraclavicular, and axillary nodes normal. Resp: clear to auscultation bilaterally Back: symmetric, no curvature. ROM normal. No CVA tenderness. Cardio: regular rate and rhythm, S1, S2 normal, no murmur, click, rub or gallop GI: soft, non-tender; bowel sounds normal; no masses,  no organomegaly Extremities: extremities normal, atraumatic, no cyanosis or edema  ECOG PERFORMANCE STATUS: 1 - Symptomatic but completely ambulatory  Blood pressure (!) 155/87, pulse 77, temperature (!) 95 F (35 C), temperature source Oral, resp. rate 18, height _0  (1.626 m), weight 183 lb 4.8 oz (83.1 kg), SpO2 95 %.  LABORATORY DATA: Lab Results  Component Value Date   WBC 8.7 09/12/2016   HGB 15.5 09/12/2016   HCT 47.5 (H) 09/12/2016  MCV 88.3 09/12/2016   PLT 297 09/12/2016      Chemistry      Component Value Date/Time   NA 141 09/12/2016 1147   K 4.9 09/12/2016 1147   CL 99 (L) 02/07/2016 0431   CO2 30 (H) 09/12/2016 1147   BUN 21.1 09/12/2016 1147   CREATININE 0.9 09/12/2016 1147      Component Value Date/Time   CALCIUM 10.3 09/12/2016 1147   ALKPHOS 93 09/12/2016 1147   AST 24 09/12/2016 1147   ALT 32 09/12/2016 1147   BILITOT 0.48 09/12/2016 1147       RADIOGRAPHIC STUDIES: No results found.  ASSESSMENT AND PLAN: This is a very pleasant 76 years old white female with a stage IA non-small cell lung cancer, squamous cell carcinoma status post left lower lobectomy with lymph node dissection. The patient is currently on  observation. She had repeat CT scan of the chest performed earlier today. I personally and independently reviewed the scan images and discuss the results with the patient today. The final report is still pending but I did not see any concerning findings for disease recurrence, pending the final report. I will see her back for follow-up visit in 6 months with repeat CT scan of the chest for restaging of her disease if there is no concerning finding on the scan today. She was advised to call immediately if she has any concerning symptoms in the interval. The patient voices understanding of current disease status and treatment options and is in agreement with the current care plan. All questions were answered. The patient knows to call the clinic with any problems, questions or concerns. We can certainly see the patient much sooner if necessary.  Disclaimer: This note was dictated with voice recognition software. Similar sounding words can inadvertently be transcribed and may not be corrected upon review.

## 2016-10-23 ENCOUNTER — Encounter: Payer: Self-pay | Admitting: Neurology

## 2016-10-23 ENCOUNTER — Ambulatory Visit (INDEPENDENT_AMBULATORY_CARE_PROVIDER_SITE_OTHER): Payer: PPO | Admitting: Neurology

## 2016-10-23 VITALS — BP 100/63 | HR 63 | Ht 64.0 in | Wt 190.2 lb

## 2016-10-23 DIAGNOSIS — G458 Other transient cerebral ischemic attacks and related syndromes: Secondary | ICD-10-CM | POA: Diagnosis not present

## 2016-10-23 DIAGNOSIS — H8110 Benign paroxysmal vertigo, unspecified ear: Secondary | ICD-10-CM | POA: Diagnosis not present

## 2016-10-23 DIAGNOSIS — M5481 Occipital neuralgia: Secondary | ICD-10-CM | POA: Diagnosis not present

## 2016-10-23 DIAGNOSIS — I771 Stricture of artery: Secondary | ICD-10-CM

## 2016-10-23 DIAGNOSIS — I779 Disorder of arteries and arterioles, unspecified: Secondary | ICD-10-CM

## 2016-10-23 DIAGNOSIS — I739 Peripheral vascular disease, unspecified: Secondary | ICD-10-CM

## 2016-10-23 NOTE — Patient Instructions (Addendum)
-  continue ASA and pravastatin for stroke prevention - check BP at home and record  - follow up with Dr. Trula Slade and Dr. Gwenlyn Found for carotid stenosis.  - Follow up with your primary care physician for stroke risk factor modification. Recommend maintain blood pressure goal 130-150/80, diabetes with hemoglobin A1c goal below 7.0% and lipids with LDL cholesterol goal below 70 mg/dL.  - follow up with Dr. Luan Pulling for pain management - need to repeat maneuver if your vertigo come back - follow up as needed.

## 2016-10-23 NOTE — Progress Notes (Signed)
NEUROLOGY CLINIC FOLLOW UP NOTE  NAME: Caitlin Oliver DOB: 11/22/1940  HPI: Caitlin Oliver is a 76 y.o. female with PMH of HTN, HLD, PVD, and carotid stenosis s/p b/l CEAs who presents as a new patient for Episode of double vision and vertigo.   She has bilateral carotid stenosis s/p bilateral CEAs around 2014 about 6 weeks apart with Dr. Trula Slade. Followed with Dr. Gwenlyn Found and CUS on 09/01/14 showed left ICA 50-69% stenosis with left VA retrograde flow. She had recent CUS on 04/27/15 showed right ICA 1-39% stenosis but left ICA progressed to 60-79% stenosis with left VA retrograde flow.  Patient stated that on 10/12/2015 she was in the drugstore reading signs over her head, when she bent down her head she felt sudden onset double vision, and vertigo. Double vision was horizontal diplopia lasted about one hour and gradually resolved. Vertigo feels like head round and round, and she had to sit down due to imbalance, no nausea vomiting, no fall, lasted about one hour gradually resolved. After that, she continued to feel swimmy headed with headache, walking to the right side, had to hold on things on walking. Went to see PCP that day, blood pressure 140/70, recommended to go to ER, however patient refused, only agreed to do CTA head and neck. She had to use a walker for the next 2 days, and she still felt lightheaded for about 2 weeks. She had a CTA head and neck on 10/20/2015 which showed stenosis of right CCA and distal right ICA, as well as left ICA 76% stenosis. In addition, left subclavian artery significant narrowing and origin, proximal to left VA takeoff. Consistent with left VA regional grade flow seen on CUS and therefore subclavian steal syndrome.   Patient today in clinic continues to complain of lightheadedness, posterior head headache, light sensitivity. Check blood pressure 92/56. She stated that she took BP meds this morning. She also stated that her BP is also low at home.    She has hx of  HTN, HLD following with Dr. Gwenlyn Found, on pravastatin, diltiazem, HCTZ, metoprolol and lisinopirl. She also has PVD s/p diamondback orbital rotational atherectomy, PTA and stenting using a cast stents of her iliac arteries by Dr. Judithann Sauger 07/27/13 resulted in complete resolution of her claudication symptoms and normalization of her Dopplers.   She had GI bleeding in 2016 taking aspirin and the Plavix after stents placed in lower extremities. Had a GI consult, required 3 units of blood transfusion. GI workup showed bleeding ulcer in the stomach. She was told not to take any Plavix anymore. She is on aspirin 81 currently. Follow up with GI later was told ulcer was healed.  11/22/15 follow up - patient had followed up with Dr. Trula Slade and had cerebral angiogram as well as left subclavian artery stenting due to high-grade stenosis with subclavian steal syndrome. Patient tolerating with the procedure pretty well. BP check at home much better at 120-140s, today in clinic 100/69. However, patient continue to complain of neck pain, radiating headache to bilateral temporal and vertex, and lightheadedness. Found to have bilateral occipital neuralgia.   03/26/16 follow up - pt has been doing well from neuro standpoint. She had occipital nerve blocks in 11/2015 and 12/2015, and since then no more occipital neuralgia. She followed with [facility] Dr. Trula Slade in 12/2015 and left ICA 60-79% stenosis but VA all patent. She had left upper lobe lung resection for lung cancer treatment in 01/2016 without chemo and radiation. However, she continued to  have left chest pain post op and currently under treatment with pain management. She is only on cardizam and metoprolol now and BP stable. Today 131/78.  06/19/16 follow up - Pt came in again complaining of headache and dizziness. He followed with oncology for lung caner and planned to repeat CT chest in 08/2016 and PET scan some time. She also followed with Dr. Gwenlyn Found and PCP. Does not remember when HA  restarted but similar as before. She was also referred to ENT Dr.Shoemaker for vertigo but the vertigo she describes is more consistent with orthostasis. BP 129/71 today.   Interval history: During the interval time, patient has been doing well. She had radiofrequency ablation at bilateral occipital nerves by Dr. Luan Pulling. Since the procedure patient felt headache much relieved, and she is very happy about that. She also had BPPV maneuver treatments just once and her vertigo is also gone. She still complains of mild frontal headache, but likely due to allergy and not bothering her. BP today 100/63, asymptomatic.   Past Medical History:  Diagnosis Date  . Anxiety   . Carotid artery disease ([place])   . Claudication ([place])   . COPD (chronic obstructive pulmonary disease) ([place])   . Diabetes mellitus   . Dizzy   . GERD (gastroesophageal reflux disease)   . Headache   . History of kidney stones   . Hypercholesteremia   . Hypertension   . 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 Shortness of breath    with exertion  . Stomach ulcer   . Tobacco abuse    Past Surgical History:  Procedure Laterality Date  . ABDOMINAL HYSTERECTOMY    . APPENDECTOMY    . BREAST REDUCTION SURGERY    . CAROTID ANGIOGRAM N/A 02/25/2013   Procedure: CAROTID ANGIOGRAM;  Surgeon: Lorretta Harp, MD;  Location: [facility] CATH LAB;  Service: Cardiovascular;  Laterality: N/A;  . ENDARTERECTOMY Right 03/06/2013   Procedure: ENDARTERECTOMY CAROTID-RIGHT;  Surgeon: Serafina Mitchell, MD;  Location: [facility];  Service: Vascular;  Laterality: Right;  . ENDARTERECTOMY Left 05/07/2013   Procedure: LEFT CAROTID ARTERY ENDARTERECTOMY WITH VASCU-GUARD PATCH ANGIOPLASTY ;  Surgeon: Serafina Mitchell, MD;  Location: [facility];  Service: Vascular;  Laterality: Left;  . LOBECTOMY Left 02/03/2016   Procedure: LEFT UPPER LOBECTOMY;  Surgeon: [name], MD;  Location: [facility];  Service: Thoracic;   Laterality: Left;  . LOWER EXTREMITY ANGIOGRAM N/A 02/25/2013   Procedure: LOWER EXTREMITY ANGIOGRAM;  Surgeon: Lorretta Harp, MD;  Location: [facility] CATH LAB;  Service: Cardiovascular;  Laterality: N/A;  . LOWER EXTREMITY ANGIOGRAM N/A 07/27/2013   Procedure: LOWER EXTREMITY ANGIOGRAM;  Surgeon: Lorretta Harp, MD;  Location: [facility] CATH LAB;  Service: Cardiovascular;  Laterality: N/A;  . PATCH ANGIOPLASTY Right 03/06/2013   Procedure: PATCH ANGIOPLASTY of Right Carotid Artery using Vascu-Guard Patch;  Surgeon: Serafina Mitchell, MD;  Location: [facility];  Service: Vascular;  Laterality: Right;  . PERIPHERAL VASCULAR CATHETERIZATION N/A 11/15/2015   Procedure: Aortic Arch Angiography;  Surgeon: Serafina Mitchell, MD;  Location: [facility] CV LAB;  Service: Cardiovascular;  Laterality: N/A;  . PERIPHERAL VASCULAR CATHETERIZATION Bilateral 11/15/2015   Procedure: Carotid Angiography;  Surgeon: Serafina Mitchell, MD;  Location: [facility] CV LAB;  Service: Cardiovascular;  Laterality: Bilateral;  . PERIPHERAL VASCULAR CATHETERIZATION Left 11/15/2015   Procedure: Upper Extremity Angiography;  Surgeon: Serafina Mitchell, MD;  Location: [facility] CV LAB;  Service: Cardiovascular;  Laterality: Left;  . PERIPHERAL VASCULAR CATHETERIZATION Left 11/15/2015   Procedure: Peripheral Vascular Intervention;  Surgeon: Serafina Mitchell, MD;  Location: [facility] CV LAB;  Service: Cardiovascular;  Laterality: Left;  subclavian   . SALIVARY GLAND SURGERY     scar tissue removed from left saliva glad  . TUBAL LIGATION    . VIDEO ASSISTED THORACOSCOPY (VATS)/WEDGE RESECTION Left 02/03/2016   Procedure: VIDEO ASSISTED THORACOSCOPY;  Surgeon: [name], MD;  Location: [facility];  Service: Thoracic;  Laterality: Left;   Family History  Problem Relation Age of Onset  . Stroke Mother   . Hypertension Mother   . Kidney failure Father    Current Outpatient Prescriptions  Medication Sig Dispense Refill  . aspirin EC 81 MG tablet  Take 81 mg by mouth daily.    [name] BIOTIN 5000 PO Take by mouth.    . calcium carbonate (CALCIUM 600) 600 MG TABS tablet Take by mouth.    . diazepam (VALIUM) 5 MG tablet Take 5 mg by mouth 2 (two) times daily. Anxiety    . diltiazem (DILACOR XR) 180 MG 24 hr capsule Take 180 mg by mouth.    . fish oil-omega-3 fatty acids 1000 MG capsule Take 1 g by mouth daily.     [name] HYDROcodone-acetaminophen (NORCO/VICODIN) 5-325 MG tablet Take 1 tablet by mouth every 6 (six) hours as needed for moderate pain.    . Magnesium Gluconate 550 MG TABS Take 250 mg by mouth.     . metFORMIN (GLUCOPHAGE) 500 MG tablet Take 500 mg by mouth daily with breakfast.     . metoprolol tartrate (LOPRESSOR) 25 MG tablet Take 0.5 tablets (12.5 mg total) by mouth 2 (two) times daily. 30 tablet 1  . naphazoline-glycerin (CLEAR EYES) 0.012-0.2 % SOLN Place 1-2 drops into both eyes 4 (four) times daily as needed (for itchy eyes).    . NONFORMULARY OR COMPOUNDED ITEM [facility]:  Combo Pain #1 - Baclofen 3%, Gabapentin 8%, Aripiprazole 8.1%, Ketorolac 8%, Licocaine 5%, dispense 240grams, apply 1-2 pumps to focal pain area 3-4 times daily, rub in 1-2 minutes, +3Refills (Patient taking differently: Apply 1 application topically 3 (three) times daily as needed (for foot pain.). [facility]:  Combo Pain #1 - Baclofen 3%, Gabapentin 8%, Aripiprazole 1.9%, Ketorolac 8%, Licocaine 5%, dispense 240grams, apply 1-2 pumps to focal pain area 3-4 times daily, rub in 1-2 minutes, +3Refills) 240 each 3  . ONE TOUCH ULTRA TEST test strip     . pantoprazole (PROTONIX) 40 MG tablet Take 40 mg by mouth 2 (two) times daily.     . pravastatin (PRAVACHOL) 40 MG tablet Take 40 mg by mouth daily.    [name] STIOLTO RESPIMAT 2.5-2.5 MCG/ACT AERS as needed.      No current facility-administered medications for this visit.    Allergies  Allergen Reactions  . No Known Allergies Other (See Comments)   Social History   Social History    . Marital status: Widowed    Spouse name: N/A  . Number of children: N/A  . Years of education: N/A   Occupational History  . Not on file.   Social History Main Topics  . Smoking status: Former Smoker    Packs/day: 1.50    Years: 56.00    Types: Cigarettes    Quit date: 07/07/2013  . Smokeless tobacco: Former Systems developer    Quit date: 02/25/2013  . Alcohol use No  . Drug use: No  .  Sexual activity: No   Other Topics Concern  . Not on file   Social History Narrative  . No narrative on file    Review of Systems Full 14 system review of systems performed and notable only for those listed, all others are neg:  Constitutional:   Cardiovascular:  Ear/Nose/Throat:   Skin:  Eyes:   Respiratory:  wheezing Gastroitestinal:   Genitourinary:  Hematology/Lymphatic:   Endocrine:  Musculoskeletal:   Allergy/Immunology:   Neurological:  Headache, dizziness Psychiatric:  Sleep:    Physical Exam  Vitals:   10/23/16 1138  BP: 100/63  Pulse: 63    General - Well nourished, well developed, in no apparent distress.  Ophthalmologic - fundi not visualized due to photophobia.  Cardiovascular - Regular rate and rhythm with no murmur.    Mental Status -  Level of arousal and orientation to time, place, and person were intact. Language including expression, naming, repetition, comprehension, reading, and writing was assessed and found intact. Fund of Knowledge was assessed and was intact.  Cranial Nerves II - XII - II - Visual field intact OU. III, IV, VI - Extraocular movements intact. V - Facial sensation intact bilaterally. VII - left nasolabial fold flattening, this is her baseline as per patient. VIII - Hearing & vestibular intact bilaterally, no nystagmus. X - Palate elevates symmetrically. XI - Chin turning & shoulder shrug intact bilaterally. XII - Tongue protrusion intact.  Motor Strength - The patient's strength was normal in all extremities and pronator drift was  absent.  Bulk was normal and fasciculations were absent.   Motor Tone - Muscle tone was assessed at the neck and appendages and was normal.  Reflexes - The patient's reflexes were normal in all extremities and she had no pathological reflexes.  Sensory - Light touch, temperature/pinprick were assessed and were normal.    Coordination - The patient had normal movements in the hands with no ataxia or dysmetria.  Tremor was absent.  Gait and Station - slow speed, and small stride, but steady without tendency to fall.    Imaging  I have personally reviewed the radiological images below and agree with the radiology interpretations.  CT HEAD Prominent small vessel disease changes without CT evidence of large acute infarct. Small or medium size infarct would be difficult to exclude given the degree white matter changes. Global atrophy without hydrocephalus.  CTA NECK 3 vessel arch with calcified plaque. Aortic atherosclerosis. Plaque with moderate to marked narrowing proximal left subclavian artery, moderate narrowing proximal innominate and proximal left common carotid artery. Moderate narrowing proximal right subclavian artery. Moderate to mark narrowing proximal right common carotid artery. Post right carotid endarterectomy. Plaque with mild narrowing distal right common carotid artery. No significant stenosis right carotid bifurcation. Plaque distal right internal carotid artery with moderate to marked narrowing proximal to the skullbase. Moderate narrowing origin of the left common carotid artery. Plaque with mild to moderate narrowing mid to distal left common carotid artery. Plaque with 76% diameter stenosis proximal left internal carotid artery. Ectatic proximal vertebral arteries without high-grade stenosis. Several thyroid lesions measuring up to 3.1 cm. Thyroid ultrasound recommended for further delineation.  CTA HEAD  Calcified plaque with mild to slightly mild narrowing  cavernous segment internal carotid artery bilaterally. Fetal contribution to the posterior cerebral artery bilaterally. Mild narrowing distal vertebral arteries. Mild narrowing basilar artery. Mild narrowing portions of the posterior cerebral arteries bilaterally.  12/26/15 CUS -  60-79% left recurrent carotid stenosis.  An 1-39 percent right  carotid stenosis.  Both vertebral arteries are antegrade  Lab Review 06/21/15 LDL 75 and TG 217    Assessment:   In summary, Caitlin Oliver is a 76 y.o. female with PMH of HTN, HLD, PVD s/p stents in LEs, carotid stenosis s/p b/l CEAs had episode of transient double vision and vertigo on 10/12/15 with neck movement. Continue to have lightheadedness and posterior headache. Her recent CUS indicating progressive left ICA re-stenosis and left VA retrograde flow. Recent CTA head and neck showed left ICA 75% stenosis, right ICA distal stenosis, and left subclavian artery origin severe stenosis. Taken together, it is consistent with subclavian steal syndrome. In addition, patient has worsening symptoms with neck moment, need to consider bow hunter syndrome. Patient BP low at home, today 92/56, likely BP medication related. She needs higher BP goal for any great cerebral perfusion due to multiple vessel stenosis. Will discontinue lisinopril and HCTZ. Had urgent [facility] consultation and received left subclavian artery stenting. Continue to follow up with [facility] and repeat CUS showed left ICA 60-79% stenosis. MRI brain not done due to metal stent in body. BP improved after medication adjustment. However, patient continues to have bilateral occipital neuralgia. Received occipital nerve block x 2 and free of occipital neuralgia since the second shot. She had left lung cancer surgery in 01/2016 and underwent pain management for left chest pain. 06/2016 again for occipital neuralgia bilaterally, occipital nerve block did not help. Had occipital nerve radiofrequency ablation by pain  management, and HA resolved. Also had BPPV maneuver treatment and vertigo is gone. She is doing well currently.   Plan: - continue ASA and pravastatin for stroke prevention - check BP at home and record  - follow up with Dr. Trula Slade and Dr. Gwenlyn Found for carotid stenosis.  - Follow up with your primary care physician for stroke risk factor modification. Recommend maintain blood pressure goal 130-150/80, diabetes with hemoglobin A1c goal below 7.0% and lipids with LDL cholesterol goal below 70 mg/dL.  - follow up with Dr. Luan Pulling for pain management - need to repeat maneuver if your vertigo come back - follow up as needed.   No orders of the defined types were placed in this encounter.   Meds ordered this encounter  Medications  . DISCONTD: HYDROcodone-acetaminophen (NORCO/VICODIN) 5-325 MG tablet    Sig: TAKE ONE TABLET BY MOUTH EVERY 6 HOURS AS NEEDED FOR PAIN  . DISCONTD: diazepam (VALIUM) 5 MG tablet    Sig: Take one in the morning and 2 at night if needed  . STIOLTO RESPIMAT 2.5-2.5 MCG/ACT AERS    Sig: as needed.   [name] DISCONTD: metFORMIN (GLUCOPHAGE) 500 MG tablet    Sig: Take 500 mg by mouth.  . DISCONTD: pravastatin (PRAVACHOL) 40 MG tablet    Sig: Take 1 tablet by mouth daily  . DISCONTD: metoprolol tartrate (LOPRESSOR) 25 MG tablet    Sig: Take one pill twice per day  . diltiazem (DILACOR XR) 180 MG 24 hr capsule    Sig: Take 180 mg by mouth.  . ONE TOUCH ULTRA TEST test strip  . calcium carbonate (CALCIUM 600) 600 MG TABS tablet    Sig: Take by mouth.  . Magnesium Gluconate 550 MG TABS    Sig: Take 250 mg by mouth.   [name] BIOTIN 5000 PO    Sig: Take by mouth.    Patient Instructions  - continue ASA and pravastatin for stroke prevention - check BP at home and record  - follow up with  Dr. Trula Slade and Dr. Gwenlyn Found for carotid stenosis.  - Follow up with your primary care physician for stroke risk factor modification. Recommend maintain blood pressure goal 130-150/80, diabetes  with hemoglobin A1c goal below 7.0% and lipids with LDL cholesterol goal below 70 mg/dL.  - follow up with Dr. Luan Pulling for pain management - need to repeat maneuver if your vertigo come back - follow up as needed.    Rosalin Hawking, MD PhD [facility] [address] [place], [place] [address] [phone]

## 2016-10-31 DIAGNOSIS — H8110 Benign paroxysmal vertigo, unspecified ear: Secondary | ICD-10-CM | POA: Diagnosis not present

## 2016-10-31 DIAGNOSIS — R2681 Unsteadiness on feet: Secondary | ICD-10-CM | POA: Diagnosis not present

## 2016-11-02 ENCOUNTER — Ambulatory Visit: Payer: PPO | Attending: Otolaryngology | Admitting: Rehabilitative and Restorative Service Providers"

## 2016-11-02 VITALS — BP 139/87 | HR 68

## 2016-11-02 DIAGNOSIS — H8111 Benign paroxysmal vertigo, right ear: Secondary | ICD-10-CM | POA: Insufficient documentation

## 2016-11-02 DIAGNOSIS — R29898 Other symptoms and signs involving the musculoskeletal system: Secondary | ICD-10-CM

## 2016-11-02 DIAGNOSIS — R2681 Unsteadiness on feet: Secondary | ICD-10-CM

## 2016-11-02 DIAGNOSIS — H8112 Benign paroxysmal vertigo, left ear: Secondary | ICD-10-CM | POA: Diagnosis not present

## 2016-11-02 NOTE — Patient Instructions (Signed)
Head Motion: Side to Side    Sitting, tilt head down slightly, slowly move head to right with eyes open. Hold position until symptoms subside. Then, move head slowly to opposite side. Hold position until symptoms subside. Repeat _5___ times per session. Do _2___ sessions per day.  Copyright  VHI. All rights reserved.   Gaze Stabilization: Sitting    Keeping eyes on target on wall 2-3 feet away, and move head side to side for 10 times.  Do __2-3__ sessions per day.  Copyright  VHI. All rights reserved.   Gaze Stabilization: Tip Card  1.Target must remain in focus, not blurry, and appear stationary while head is in motion. 2.Perform exercises with small head movements (45 to either side of midline). 3.Increase speed of head motion so long as target is in focus. 4.If you wear eyeglasses, be sure you can see target through lens (therapist will give specific instructions for bifocal / progressive lenses). 5.These exercises may provoke dizziness or nausea. Work through these symptoms. If too dizzy, slow head movement slightly. Rest between each exercise. 6.Exercises demand concentration; avoid distractions.  Copyright  VHI. All rights reserved.

## 2016-11-02 NOTE — Therapy (Signed)
[facility] [address] [place], [place], [address] Phone: [phone]   Fax:  [phone]  Physical Therapy Evaluation  Patient Details  Name: Caitlin Oliver MRN: [id number] Date of Birth: 02-12-1941 Referring Provider: Dewayne Shorter PA-C  Encounter Date: 11/02/2016      PT End of Session - 11/02/16 1154    Visit Number 1   Number of Visits 8   Date for PT Re-Evaluation 12/02/16   Authorization Type [employer]   Authorization Time Period --   PT Start Time 1110   PT Stop Time 1155   PT Time Calculation (min) 45 min   Activity Tolerance Patient tolerated treatment well   Behavior During Therapy [facility] for tasks assessed/performed      Past Medical History:  Diagnosis Date  . Anxiety   . Carotid artery disease ([place])   . Claudication ([place])   . COPD (chronic obstructive pulmonary disease) ([place])   . Diabetes mellitus   . Dizzy   . GERD (gastroesophageal reflux disease)   . Headache   . History of kidney stones   . Hypercholesteremia   . Hypertension   . 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 Shortness of breath    with exertion  . Stomach ulcer   . Tobacco abuse     Past Surgical History:  Procedure Laterality Date  . ABDOMINAL HYSTERECTOMY    . APPENDECTOMY    . BREAST REDUCTION SURGERY    . CAROTID ANGIOGRAM N/A 02/25/2013   Procedure: CAROTID ANGIOGRAM;  Surgeon: Lorretta Harp, MD;  Location: [facility] CATH LAB;  Service: Cardiovascular;  Laterality: N/A;  . ENDARTERECTOMY Right 03/06/2013   Procedure: ENDARTERECTOMY CAROTID-RIGHT;  Surgeon: Serafina Mitchell, MD;  Location: [place];  Service: Vascular;  Laterality: Right;  . ENDARTERECTOMY Left 05/07/2013   Procedure: LEFT CAROTID ARTERY ENDARTERECTOMY WITH VASCU-GUARD PATCH ANGIOPLASTY ;  Surgeon: Serafina Mitchell, MD;  Location: [place];  Service: Vascular;  Laterality: Left;  . LOBECTOMY Left  02/03/2016   Procedure: LEFT UPPER LOBECTOMY;  Surgeon: [name], MD;  Location: [place];  Service: Thoracic;  Laterality: Left;  . LOWER EXTREMITY ANGIOGRAM N/A 02/25/2013   Procedure: LOWER EXTREMITY ANGIOGRAM;  Surgeon: Lorretta Harp, MD;  Location: [facility] LAB;  Service: Cardiovascular;  Laterality: N/A;  . LOWER EXTREMITY ANGIOGRAM N/A 07/27/2013   Procedure: LOWER EXTREMITY ANGIOGRAM;  Surgeon: Lorretta Harp, MD;  Location: [facility] CATH LAB;  Service: Cardiovascular;  Laterality: N/A;  . PATCH ANGIOPLASTY Right 03/06/2013   Procedure: PATCH ANGIOPLASTY of Right Carotid Artery using Vascu-Guard Patch;  Surgeon: Serafina Mitchell, MD;  Location: [place];  Service: Vascular;  Laterality: Right;  . PERIPHERAL VASCULAR CATHETERIZATION N/A 11/15/2015   Procedure: Aortic Arch Angiography;  Surgeon: Serafina Mitchell, MD;  Location: [place] CV LAB;  Service: Cardiovascular;  Laterality: N/A;  . PERIPHERAL VASCULAR CATHETERIZATION Bilateral 11/15/2015   Procedure: Carotid Angiography;  Surgeon: Serafina Mitchell, MD;  Location: [place] CV LAB;  Service: Cardiovascular;  Laterality: Bilateral;  . PERIPHERAL VASCULAR CATHETERIZATION Left 11/15/2015   Procedure: Upper Extremity Angiography;  Surgeon: Serafina Mitchell, MD;  Location: [place] CV LAB;  Service: Cardiovascular;  Laterality: Left;  . PERIPHERAL VASCULAR CATHETERIZATION Left 11/15/2015   Procedure: Peripheral Vascular Intervention;  Surgeon: Serafina Mitchell, MD;  Location: [place] CV LAB;  Service: Cardiovascular;  Laterality: Left;  subclavian   . SALIVARY  GLAND SURGERY     scar tissue removed from left saliva glad  . TUBAL LIGATION    . VIDEO ASSISTED THORACOSCOPY (VATS)/WEDGE RESECTION Left 02/03/2016   Procedure: VIDEO ASSISTED THORACOSCOPY;  Surgeon: [name], MD;  Location: [place];  Service: Thoracic;  Laterality: Left;    Vitals:   11/02/16 1114  BP: 139/87  Pulse: 68         Subjective Assessment -  11/02/16 1114    Subjective The patient reports she saw Vinnie Level in 08/2016 due to sensation of spinning vertigo.  It cleared with one treatment.   She notes return of symptoms the other day when getting out of the shower.  She describes dizziness, lightheaded sensation.  Symptoms are present even with sitting still.  She denies double vision with this episode, however she does note she had an episode last year that lasted for minutes (after looking up at [facility]).     Pertinent History MRI reveals R cerebellar lacunar infarcts and white matter changes.  h/o Migraines, tobacco use,    Patient Stated Goals Get rid of vertigo   Currently in Pain? Yes   Pain Score 6    Pain Location Head   Pain Descriptors / Indicators Aching   Pain Type Chronic pain   Pain Onset More than a month ago   Pain Frequency Intermittent   Aggravating Factors  She feels that headache is related to sinuses (she has blocked saliva gland)   Pain Relieving Factors unsure            [facility] PT Assessment - 11/02/16 1124      Assessment   Medical Diagnosis vertigo   Referring Provider Dewayne Shorter PA-C   Onset Date/Surgical Date 10/31/16  one spell, then yesterday morning, constant spinning   Prior Therapy known to our clinic from prior PT eval     Precautions   Precautions Fall     Restrictions   Weight Bearing Restrictions No     Balance Screen   Has the patient fallen in the past 6 months No   Has the patient had a decrease in activity level because of a fear of falling?  No   Is the patient reluctant to leave their home because of a fear of falling?  No     Home Environment   Living Environment Private residence   Living Arrangements Alone   Type of [place] to enter   Entrance Stairs-Number of Steps 6   Entrance Stairs-Rails Can reach both   [place] One level   [place] - 2 wheels;Shower seat;Cane - single point   Additional Comments Patient notes she drove  to MD office yesterday and didn't think she would make it.  She had her brother drive her today.     Prior Function   Level of Independence Independent   Vocation Retired     Ambulation/Gait   Ambulation/Gait Yes   Ambulation/Gait Assistance 5: Supervision   Ambulation Distance (Feet) 75 Feet   Assistive device Rolling walker   Gait Pattern --  unsteady gait, slowed pace   Ambulation Surface Level   Gait velocity 1.43 ft/sec            Vestibular Assessment - 11/02/16 1127      Vestibular Assessment   General Observation Baseline level of dizziness (constant in nature) is 3/10, worse with movement.  Patient notes balance is worse.  Oculomotor testing aggravates symptoms.  Nausea noted at baseline as well today.     Symptom Behavior   Type of Dizziness Lightheadedness   Frequency of Dizziness constant  fluctuates in intensity   Duration of Dizziness constant   Aggravating Factors --  more constant this time   Relieving Factors No known relieving factors     Occulomotor Exam   Occulomotor Alignment Normal   Spontaneous Absent   Gaze-induced Absent   Smooth Pursuits Saccades  in vertical plane L gaze; WNLs horizontal smooth pursuits   Saccades --  Takes 2 eye mvmts to target   Comment Moving her eyes increases nausea.  Resting position of face demonstrates some assymmetry, however L cheeck swollen from blocked saliva gland.     HINTS testing:  Head impulse=positive to left side for large amplitude, slow refixation saccade, small saccade also noted to the right side with increased symptoms.  Nystagmus are not present at rest and none noted with smooth pursuits or gaze holding.  Test of Skew is WNLs.     Vestibulo-Occular Reflex   VOR 1 Head Only (x 1 viewing) provokes nausea with slow gaze   Comment Head impulse (see HINTS test above)     Positional Testing   Sidelying Test Sidelying Right;Sidelying Left   Horizontal Canal Testing Horizontal Canal Right;Horizontal  Canal Left     Dix-Hallpike Left   Dix-Hallpike Left Duration --     Sidelying Right   Sidelying Right Duration L side is worse.  Patient notes that she sleeps on her right side.  The dizziness settles in this position, but nausea remains.   Sidelying Right Symptoms No nystagmus     Sidelying Left   Sidelying Left Duration subjectively reports "my head is coming off" when lying down.  Note very mild nystagmus viewed in room light.  Symptoms increase to 4-5/10 dizziness.   Returning to sitting provokes increase in dizziness rated 6-7/10.   Sidelying Left Symptoms Other (comment)  hard to differentiate in room light, patient closes eyes     Horizontal Canal Right   Horizontal Canal Right Duration slowly settles symptoms to lie down on the right side   Horizontal Canal Right Symptoms Normal     Horizontal Canal Left   Horizontal Canal Left Duration gets worse when rolling onto the left side, nausea and dizziness are aggravated.  Held position x 30 seconds with dizziness rising from 5/10.  Rolled back to her right side to relieve symptoms   Horizontal Canal Left Symptoms Normal        Objective measurements completed on examination: See above findings.           Vestibular Treatment/Exercise - 11/02/16 1147      Vestibular Treatment/Exercise   Vestibular Treatment Provided Habituation;Gaze   Habituation Exercises Seated Horizontal Head Turns   Gaze Exercises X1 Viewing Horizontal     Seated Horizontal Head Turns   Number of Reps  3   Symptom Description  patient got dizziness, recommended paitnet do at home     X1 Viewing Horizontal   Foot Position seated, patient tolerates slow VOR x 10 reps before getting worsening dizziness/nausea   Comments Recommended patient begin at 10 reps for HEP.               PT Education - 11/02/16 1156    Education provided Yes   Education Details HEP: see handout (gaze, seated head motion)   Person(s) Educated Patient   Methods  Explanation;Demonstration;Handout   Comprehension Verbalized understanding;Returned demonstration;Need  further instruction             PT Long Term Goals - Nov 21, 2016 1625      PT LONG TERM GOAL #1   Title The patient will be indep with HEP for gaze, motion sensitivity, balance and general mobility.  TARGET DATE FOR ALL LTGS:  12/03/16   Time 4   Period Weeks     PT LONG TERM GOAL #2   Title The patient will improve gait speed from 1.43 ft/sec to > or equal to 2.0 ft/sec to demo dec'ing risk for falls.   Time 4   Period Weeks     PT LONG TERM GOAL #3   Title The patient will tolerate sit<>L sidelying with dizziness < or equal to 2/10 to demo dec'd motion sensitivity.   Time 4   Period Weeks     PT LONG TERM GOAL #4   Title The patient will tolerate gaze x 1 viewing x 30 seconds with nausea and dizziness < or equal to 2/10.   Time 4   Period Weeks     PT LONG TERM GOAL #5   Title The patient will improve baseline dizziness from 3/10 to 0/10.   Time 4   Period Weeks     Additional Long Term Goals   Additional Long Term Goals Yes     PT LONG TERM GOAL #6   Title Further assess balance and write goals as indicated.   Time 4   Period Weeks                Plan - 2016/11/21 1619    Clinical Impression Statement The patient is a 76 year old female returning to PT (was evaluated 08/2016 with BPPV) with a return of vertigo that is different in quality on 10/31/16.  Patient's symptoms are constant at rest, worse with moving, she also notes imbalance and visual blurring.  She is using a RW due to imbalance and reports nausea at all times.  Today's evaluation included performance of HINTS exam to ensure symptoms more consistent with peripheral disorder.  At this time, HINTS negative for central findings.  However, patient does have oculomotor deficits (could be due to h/o infarcts per MRI in chart).  PT educated patient on need to be seen in ED if HA, weakness, double vision, or  speech difficulties begin.    AT this time, PT will focus on treating gaze deficits, tolerance to motion, and further evaluate gait/balance.   History and Personal Factors relevant to plan of care: Lives alone, using RW, h/o migraines, h/o infarcts on imaging   Clinical Presentation Evolving   Clinical Presentation due to: due to recent decline in status   Clinical Decision Making Moderate   Rehab Potential Good   PT Frequency 2x / week   PT Duration 4 weeks   PT Treatment/Interventions ADLs/Self Care Home Management;Canalith Repostioning;Functional mobility training;Stair training;Gait training;Therapeutic activities;Therapeutic exercise;Balance training;Neuromuscular re-education;Vestibular   PT Next Visit Plan Check gaze x 1 viewing, assess balance ([place]), motion sensitivity (L sidelying as tolerable)   Consulted and Agree with Plan of Care Patient      Patient will benefit from skilled therapeutic intervention in order to improve the following deficits and impairments:  Dizziness, Difficulty walking, Decreased balance, Abnormal gait, Pain, Decreased mobility  Visit Diagnosis: Other symptoms and signs involving the musculoskeletal system  Unsteadiness on feet  BPPV (benign paroxysmal positional vertigo), left      G-Codes - 11/21/16 1627  Functional Assessment Tool Used (Outpatient Only) constant dizziness, walking with RW   Functional Limitation Mobility: Walking and moving around   Mobility: Walking and Moving Around Current Status [phone]) At least 40 percent but less than 60 percent impaired, limited or restricted   Mobility: Walking and Moving Around Goal Status [phone]) At least 20 percent but less than 40 percent impaired, limited or restricted       Problem List Patient Active Problem List   Diagnosis Date Noted  . Benign paroxysmal positional vertigo 10/23/2016  . Headache 06/19/2016  . Cancer of left lung ([place]) 02/03/2016  . COPD GOLD II 12/08/2015  . Solitary  pulmonary nodule 12/08/2015  . Bilateral occipital neuralgia 11/22/2015  . Double vision 10/28/2015  . TIA (transient ischemic attack) 10/28/2015  . Subclavian artery stenosis, left ([place]) 10/28/2015  . Subclavian steal syndrome 10/28/2015  . Acute blood loss anemia 10/20/2013  . C. difficile colitis 10/19/2013  . GI bleed 10/18/2013  . COPD 10/18/2013  . Bleeding gastrointestinal 10/18/2013  . Hypoxia 10/10/2013  . Bronchitis 10/08/2013  . PVD (peripheral vascular disease) with claudication ([facility]) 07/29/2013  . Carotid stenosis 05/07/2013  . Stenosis of left carotid artery 05/07/2013  . Occlusion and stenosis of carotid artery without mention of cerebral infarction 03/02/2013  . Carotid artery obstruction 03/02/2013  . Carotid artery disease ([place]) 02/27/2013  . Tobacco abuse 02/27/2013  . Compulsive tobacco user syndrome 02/27/2013  . Claudication ([place]) 01/20/2013  . Essential hypertension 01/20/2013  . Type 2 diabetes mellitus ([place]) 01/20/2013  . Hyperlipidemia 01/20/2013    Chandan Fly, PT 11/02/2016, 4:28 PM  [facility] [address] [place], [place], [address] Phone: [phone]   Fax:  [phone]  Name: BEAUTIFUL PENSYL MRN: [id number] Date of Birth: 07/01/1940

## 2016-11-05 ENCOUNTER — Ambulatory Visit: Payer: PPO | Admitting: Rehabilitative and Restorative Service Providers"

## 2016-11-05 VITALS — BP 125/75 | HR 63

## 2016-11-05 DIAGNOSIS — R29898 Other symptoms and signs involving the musculoskeletal system: Secondary | ICD-10-CM | POA: Diagnosis not present

## 2016-11-05 DIAGNOSIS — H8112 Benign paroxysmal vertigo, left ear: Secondary | ICD-10-CM

## 2016-11-05 DIAGNOSIS — R2681 Unsteadiness on feet: Secondary | ICD-10-CM

## 2016-11-05 NOTE — Patient Instructions (Signed)
Habituation - Tip Card  1.The goal of habituation training is to assist in decreasing symptoms of vertigo, dizziness, or nausea provoked by specific head and body motions. 2.These exercises may initially increase symptoms; however, be persistent and work through symptoms. With repetition and time, the exercises will assist in reducing or eliminating symptoms. 3.Exercises should be stopped and discussed with the therapist if you experience any of the following: - Sudden change or fluctuation in hearing - New onset of ringing in the ears, or increase in current intensity - Any fluid discharge from the ear - Severe pain in neck or back - Extreme nausea  Copyright  VHI. All rights reserved.   Habituation - Sit to Side-Lying   Sit on edge of bed. Lie down onto the right side and hold until dizziness stops, plus 20 seconds.  Return to sitting and wait until dizziness stops, plus 20 seconds.  Repeat to the left side. Repeat sequence 5 times per session. Do 2 sessions per day.  Copyright  VHI. All rights reserved.    Gaze Stabilization: Sitting    Keeping eyes on target on wall 2-3 feet away, and move head side to side for 10 times.  Do __2-3__ sessions per day.  Copyright  VHI. All rights reserved.   Gaze Stabilization: Tip Card  1.Target must remain in focus, not blurry, and appear stationary while head is in motion. 2.Perform exercises with small head movements (45 to either side of midline). 3.Increase speed of head motion so long as target is in focus. 4.If you wear eyeglasses, be sure you can see target through lens (therapist will give specific instructions for bifocal / progressive lenses). 5.These exercises may provoke dizziness or nausea. Work through these symptoms. If too dizzy, slow head movement slightly. Rest between each exercise. 6.Exercises demand concentration; avoid distractions.  Copyright  VHI. All rights reserved.    Feet Apart, Head Motion - Eyes  Open    With eyes open, feet apart, move head slowly SIDE TO SIDE x 5 times.  Do ___2_ sessions per day.  Copyright  VHI. All rights reserved.   Feet Apart, Varied Arm Positions - Eyes Closed    Stand with feet shoulder width apart and arms out. Close eyes and visualize upright position. Hold __30__ seconds. Repeat __3__ times per session. Do 2__ sessions per day.  Copyright  VHI. All rights reserved.

## 2016-11-05 NOTE — Therapy (Signed)
[facility] [address] [place], [place], [address] Phone: [phone]   Fax:  [phone]  Physical Therapy Treatment  Patient Details  Name: Caitlin Oliver MRN: [id number] Date of Birth: February 04, 1941 Referring Provider: Dewayne Shorter PA-C  Encounter Date: 11/05/2016      PT End of Session - 11/05/16 1025    Visit Number 2   Number of Visits 8   Date for PT Re-Evaluation 12/02/16   Authorization Type [employer]   PT Start Time 1022   PT Stop Time 1102   PT Time Calculation (min) 40 min   Equipment Utilized During Treatment Gait belt   Activity Tolerance Patient tolerated treatment well   Behavior During Therapy [facility] for tasks assessed/performed      Past Medical History:  Diagnosis Date  . Anxiety   . Carotid artery disease ([place])   . Claudication ([place])   . COPD (chronic obstructive pulmonary disease) ([place])   . Diabetes mellitus   . Dizzy   . GERD (gastroesophageal reflux disease)   . Headache   . History of kidney stones   . Hypercholesteremia   . Hypertension   . 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 Shortness of breath    with exertion  . Stomach ulcer   . Tobacco abuse     Past Surgical History:  Procedure Laterality Date  . ABDOMINAL HYSTERECTOMY    . APPENDECTOMY    . BREAST REDUCTION SURGERY    . CAROTID ANGIOGRAM N/A 02/25/2013   Procedure: CAROTID ANGIOGRAM;  Surgeon: Lorretta Harp, MD;  Location: [facility] CATH LAB;  Service: Cardiovascular;  Laterality: N/A;  . ENDARTERECTOMY Right 03/06/2013   Procedure: ENDARTERECTOMY CAROTID-RIGHT;  Surgeon: Serafina Mitchell, MD;  Location: [facility];  Service: Vascular;  Laterality: Right;  . ENDARTERECTOMY Left 05/07/2013   Procedure: LEFT CAROTID ARTERY ENDARTERECTOMY WITH VASCU-GUARD PATCH ANGIOPLASTY ;  Surgeon: Serafina Mitchell, MD;  Location: [facility];  Service: Vascular;  Laterality: Left;  .  LOBECTOMY Left 02/03/2016   Procedure: LEFT UPPER LOBECTOMY;  Surgeon: [name], MD;  Location: [place];  Service: Thoracic;  Laterality: Left;  . LOWER EXTREMITY ANGIOGRAM N/A 02/25/2013   Procedure: LOWER EXTREMITY ANGIOGRAM;  Surgeon: Lorretta Harp, MD;  Location: [facility] CATH LAB;  Service: Cardiovascular;  Laterality: N/A;  . LOWER EXTREMITY ANGIOGRAM N/A 07/27/2013   Procedure: LOWER EXTREMITY ANGIOGRAM;  Surgeon: Lorretta Harp, MD;  Location: [facility] CATH LAB;  Service: Cardiovascular;  Laterality: N/A;  . PATCH ANGIOPLASTY Right 03/06/2013   Procedure: PATCH ANGIOPLASTY of Right Carotid Artery using Vascu-Guard Patch;  Surgeon: Serafina Mitchell, MD;  Location: [place];  Service: Vascular;  Laterality: Right;  . PERIPHERAL VASCULAR CATHETERIZATION N/A 11/15/2015   Procedure: Aortic Arch Angiography;  Surgeon: Serafina Mitchell, MD;  Location: [place] CV LAB;  Service: Cardiovascular;  Laterality: N/A;  . PERIPHERAL VASCULAR CATHETERIZATION Bilateral 11/15/2015   Procedure: Carotid Angiography;  Surgeon: Serafina Mitchell, MD;  Location: [place] CV LAB;  Service: Cardiovascular;  Laterality: Bilateral;  . PERIPHERAL VASCULAR CATHETERIZATION Left 11/15/2015   Procedure: Upper Extremity Angiography;  Surgeon: Serafina Mitchell, MD;  Location: [place] CV LAB;  Service: Cardiovascular;  Laterality: Left;  . PERIPHERAL VASCULAR CATHETERIZATION Left 11/15/2015   Procedure: Peripheral Vascular Intervention;  Surgeon: Serafina Mitchell, MD;  Location: [place] CV LAB;  Service: Cardiovascular;  Laterality: Left;  subclavian   .  SALIVARY GLAND SURGERY     scar tissue removed from left saliva glad  . TUBAL LIGATION    . VIDEO ASSISTED THORACOSCOPY (VATS)/WEDGE RESECTION Left 02/03/2016   Procedure: VIDEO ASSISTED THORACOSCOPY;  Surgeon: [name], MD;  Location: [place];  Service: Thoracic;  Laterality: Left;    Vitals:   11/05/16 1027  BP: 125/75  Pulse: 63        Subjective  Assessment - 11/05/16 1027    Subjective The patient felt she has been moving well yesterday and today.  "I drove down here and did fine, no problems."   Pertinent History MRI reveals R cerebellar lacunar infarcts and white matter changes.  h/o Migraines, tobacco use,    Patient Stated Goals Get rid of vertigo   Currently in Pain? Yes   Pain Score 4    Pain Location Head   Pain Descriptors / Indicators Headache   Pain Type Chronic pain   Pain Onset More than a month ago   Pain Frequency Intermittent   Aggravating Factors  Headache is daily.   Pain Relieving Factors Eating peanuts seems to help.            [facility] PT Assessment - 11/05/16 1033      Standardized Balance Assessment   Standardized Balance Assessment Berg Balance Test     Berg Balance Test   Sit to Stand Able to stand without using hands and stabilize independently   Standing Unsupported Able to stand safely 2 minutes   Sitting with Back Unsupported but Feet Supported on Floor or Stool Able to sit safely and securely 2 minutes   Stand to Sit Sits safely with minimal use of hands   Transfers Able to transfer safely, minor use of hands   Standing Unsupported with Eyes Closed Able to stand 10 seconds with supervision   Standing Ubsupported with Feet Together Able to place feet together independently and stand 1 minute safely   From Standing, Reach Forward with Outstretched Arm Can reach forward >12 cm safely (5")   From Standing Position, Pick up Object from Floor Able to pick up shoe, needs supervision   From Standing Position, Turn to Look Behind Over each Shoulder Needs supervision when turning   Turn 360 Degrees Needs close supervision or verbal cueing   Standing Unsupported, Alternately Place Feet on Step/Stool Able to complete 4 steps without aid or supervision   Standing Unsupported, One Foot in Front Able to take small step independently and hold 30 seconds   Standing on One Leg Tries to lift leg/unable to hold 3  seconds but remains standing independently   Total Score 40   Berg comment: 40/56 indicating high fall risk.            Vestibular Assessment - 11/05/16 1044      Vestibular Assessment   General Observation Baseline dizziness is 3/10 today.                 [facility] Adult PT Treatment/Exercise - 11/05/16 1041      Self-Care   Self-Care Other Self-Care Comments   Other Self-Care Comments  The patient has shortness of breath noted today.  Her spO2=90% after walking and returns to 93% at rest.       Neuro Re-ed    Neuro Re-ed Details  Standing balance exercises in corner for support.  Attempted eyes closed + feet together, however needed external support.  Therefore, recommended feet apart + eyes closed for home exercise program.  Vestibular Treatment/Exercise - 11/05/16 1042      Vestibular Treatment/Exercise   Vestibular Treatment Provided Habituation;Gaze   Habituation Exercises Laruth Bouchard Daroff;Standing Horizontal Head Turns     Nestor Lewandowsky   Number of Reps  2   Symptom Description  L sidelying did not increase symptoms from baseline today on first rep.  She rates return to sitting a 7/10 described as "lightheaded" versus spinning sensation.   R sidelying "not bad" at first, but seems to get worse with holding position rates 5/10 symptoms.   Patient notes symptoms worsen as she holds position.  2nd repetition, patient notes to L that symptoms get worse with sidelying and with return to sitting.      Standing Horizontal Head Turns   Number of Reps  5   Symptom Description  with feet apart in corner near wall for support as needed.               PT Education - 11/05/16 1937    Education provided Yes   Education Details HEP: standing balance in corner with head motion, corner with eyes closed, gaze x 1, sit<>sidelying habituation   Person(s) Educated Patient   Methods Explanation;Demonstration;Handout   Comprehension Verbalized understanding;Returned  demonstration             PT Long Term Goals - 11/02/16 1625      PT LONG TERM GOAL #1   Title The patient will be indep with HEP for gaze, motion sensitivity, balance and general mobility.  TARGET DATE FOR ALL LTGS:  12/03/16   Time 4   Period Weeks     PT LONG TERM GOAL #2   Title The patient will improve gait speed from 1.43 ft/sec to > or equal to 2.0 ft/sec to demo dec'ing risk for falls.   Time 4   Period Weeks     PT LONG TERM GOAL #3   Title The patient will tolerate sit<>L sidelying with dizziness < or equal to 2/10 to demo dec'd motion sensitivity.   Time 4   Period Weeks     PT LONG TERM GOAL #4   Title The patient will tolerate gaze x 1 viewing x 30 seconds with nausea and dizziness < or equal to 2/10.   Time 4   Period Weeks     PT LONG TERM GOAL #5   Title The patient will improve baseline dizziness from 3/10 to 0/10.   Time 4   Period Weeks     Additional Long Term Goals   Additional Long Term Goals Yes     PT LONG TERM GOAL #6   Title Further assess balance and write goals as indicated.   Time 4   Period Weeks               Plan - 11/05/16 1941    Clinical Impression Statement The patient continues with constant reports of dizziness rated 3/10 today.  She scored 40/56 indicating high fall risk per Berg score.  She has motion sensitivity with reported nausea and worsening dizziness with all movement.  PT continued to develop HEP to patient tolerance.    PT Treatment/Interventions ADLs/Self Care Home Management;Canalith Repostioning;Functional mobility training;Stair training;Gait training;Therapeutic activities;Therapeutic exercise;Balance training;Neuromuscular re-education;Vestibular   PT Next Visit Plan Check HEP; motion sensitivity; review gaze x 1 viewing, dynamic gait and balance.   Consulted and Agree with Plan of Care Patient      Patient will benefit from skilled therapeutic intervention in order to improve  the following deficits and  impairments:  Dizziness, Difficulty walking, Decreased balance, Abnormal gait, Pain, Decreased mobility  Visit Diagnosis: Unsteadiness on feet  BPPV (benign paroxysmal positional vertigo), left     Problem List Patient Active Problem List   Diagnosis Date Noted  . Benign paroxysmal positional vertigo 10/23/2016  . Headache 06/19/2016  . Cancer of left lung ([place]) 02/03/2016  . COPD GOLD II 12/08/2015  . Solitary pulmonary nodule 12/08/2015  . Bilateral occipital neuralgia 11/22/2015  . Double vision 10/28/2015  . TIA (transient ischemic attack) 10/28/2015  . Subclavian artery stenosis, left ([place]) 10/28/2015  . Subclavian steal syndrome 10/28/2015  . Acute blood loss anemia 10/20/2013  . C. difficile colitis 10/19/2013  . GI bleed 10/18/2013  . COPD 10/18/2013  . Bleeding gastrointestinal 10/18/2013  . Hypoxia 10/10/2013  . Bronchitis 10/08/2013  . PVD (peripheral vascular disease) with claudication ([place]) 07/29/2013  . Carotid stenosis 05/07/2013  . Stenosis of left carotid artery 05/07/2013  . Occlusion and stenosis of carotid artery without mention of cerebral infarction 03/02/2013  . Carotid artery obstruction 03/02/2013  . Carotid artery disease ([place]) 02/27/2013  . Tobacco abuse 02/27/2013  . Compulsive tobacco user syndrome 02/27/2013  . Claudication ([place]) 01/20/2013  . Essential hypertension 01/20/2013  . Type 2 diabetes mellitus ([place]) 01/20/2013  . Hyperlipidemia 01/20/2013    Shyloh Krinke, PT 11/05/2016, 7:42 PM  [facility] [address] [place], [place], [address] Phone: [phone]   Fax:  [phone]  Name: Caitlin Oliver MRN: [id number] Date of Birth: [birth year]

## 2016-11-07 ENCOUNTER — Ambulatory Visit: Payer: PPO

## 2016-11-07 DIAGNOSIS — R29898 Other symptoms and signs involving the musculoskeletal system: Secondary | ICD-10-CM | POA: Diagnosis not present

## 2016-11-07 DIAGNOSIS — H8111 Benign paroxysmal vertigo, right ear: Secondary | ICD-10-CM

## 2016-11-07 DIAGNOSIS — R2681 Unsteadiness on feet: Secondary | ICD-10-CM

## 2016-11-07 DIAGNOSIS — H8112 Benign paroxysmal vertigo, left ear: Secondary | ICD-10-CM

## 2016-11-07 NOTE — Therapy (Signed)
[facility] [address] [place], [place], [address] Phone: [phone]   Fax:  [phone]  Physical Therapy Treatment  Patient Details  Name: Caitlin Oliver MRN: [id number] Date of Birth: 1940-08-19 Referring Provider: Dewayne Shorter PA-C  Encounter Date: 11/07/2016      PT End of Session - 11/07/16 1232    Visit Number 3   Number of Visits 8   Date for PT Re-Evaluation 12/02/16   Authorization Type [employer]   Authorization Time Period 08-20-16 - 10-19-16   PT Start Time 1237   PT Stop Time 1315   PT Time Calculation (min) 38 min   Equipment Utilized During Treatment --  S prn   Activity Tolerance Patient tolerated treatment well   Behavior During Therapy [facility] for tasks assessed/performed      Past Medical History:  Diagnosis Date  . Anxiety   . Carotid artery disease ([place])   . Claudication ([place])   . COPD (chronic obstructive pulmonary disease) ([place])   . Diabetes mellitus   . Dizzy   . GERD (gastroesophageal reflux disease)   . Headache   . History of kidney stones   . Hypercholesteremia   . Hypertension   . 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 Shortness of breath    with exertion  . Stomach ulcer   . Tobacco abuse     Past Surgical History:  Procedure Laterality Date  . ABDOMINAL HYSTERECTOMY    . APPENDECTOMY    . BREAST REDUCTION SURGERY    . CAROTID ANGIOGRAM N/A 02/25/2013   Procedure: CAROTID ANGIOGRAM;  Surgeon: Lorretta Harp, MD;  Location: [facility] CATH LAB;  Service: Cardiovascular;  Laterality: N/A;  . ENDARTERECTOMY Right 03/06/2013   Procedure: ENDARTERECTOMY CAROTID-RIGHT;  Surgeon: Serafina Mitchell, MD;  Location: [place];  Service: Vascular;  Laterality: Right;  . ENDARTERECTOMY Left 05/07/2013   Procedure: LEFT CAROTID ARTERY ENDARTERECTOMY WITH VASCU-GUARD PATCH ANGIOPLASTY ;  Surgeon: Serafina Mitchell, MD;  Location: [place];  Service: Vascular;  Laterality: Left;  . LOBECTOMY Left 02/03/2016   Procedure: LEFT UPPER LOBECTOMY;  Surgeon: [name], MD;  Location: [place];  Service: Thoracic;  Laterality: Left;  . LOWER EXTREMITY ANGIOGRAM N/A 02/25/2013   Procedure: LOWER EXTREMITY ANGIOGRAM;  Surgeon: Lorretta Harp, MD;  [facility] CATH LAB;  Service: Cardiovascular;  Laterality: N/A;  . LOWER EXTREMITY ANGIOGRAM N/A 07/27/2013   Procedure: LOWER EXTREMITY ANGIOGRAM;  Surgeon: Lorretta Harp, MD;  Location: [facility] CATH LAB;  Service: Cardiovascular;  Laterality: N/A;  . PATCH ANGIOPLASTY Right 03/06/2013   Procedure: PATCH ANGIOPLASTY of Right Carotid Artery using Vascu-Guard Patch;  Surgeon: Serafina Mitchell, MD;  Location: [place];  Service: Vascular;  Laterality: Right;  . PERIPHERAL VASCULAR CATHETERIZATION N/A 11/15/2015   Procedure: Aortic Arch Angiography;  Surgeon: Serafina Mitchell, MD;  Location: [place] CV LAB;  Service: Cardiovascular;  Laterality: N/A;  . PERIPHERAL VASCULAR CATHETERIZATION Bilateral 11/15/2015   Procedure: Carotid Angiography;  Surgeon: Serafina Mitchell, MD;  Location: [place] CV LAB;  Service: Cardiovascular;  Laterality: Bilateral;  . PERIPHERAL VASCULAR CATHETERIZATION Left 11/15/2015   Procedure: Upper Extremity Angiography;  Surgeon: Serafina Mitchell, MD;  Location: [place] CV LAB;  Service: Cardiovascular;  Laterality: Left;  . PERIPHERAL VASCULAR CATHETERIZATION Left 11/15/2015   Procedure: Peripheral Vascular Intervention;  Surgeon: Serafina Mitchell, MD;  Location: [place] CV LAB;  Service: Cardiovascular;  Laterality: Left;  subclavian   . SALIVARY GLAND SURGERY     scar tissue removed from left saliva glad  . TUBAL LIGATION    . VIDEO ASSISTED THORACOSCOPY (VATS)/WEDGE RESECTION Left 02/03/2016   Procedure: VIDEO ASSISTED THORACOSCOPY;  Surgeon: [name], MD;  Location: [place];  Service: Thoracic;  Laterality: Left;    There were no vitals filed  for this visit.      Subjective Assessment - 11/07/16 1239    Subjective Pt reported she feels much better today, rates "swimmyheaded feeling" at 1/10. Pt denied falls.    Pertinent History MRI reveals R cerebellar lacunar infarcts and white matter changes.  h/o Migraines, tobacco use,    Patient Stated Goals Get rid of vertigo   Currently in Pain? No/denies        Neuro re-ed: Pt performed and review all previous and new HEP activities, cues for new activities. Performed with S for safety. Please see pt instructions for HEP details and progression. Pt reported swimmyheaded feeling increased to 2/10 from baseline (1/10).                 [facility] Adult PT Treatment/Exercise - 11/07/16 1307      High Level Balance   High Level Balance Activities Head turns   High Level Balance Comments Performed with min guard to S over even terrain, 4x10'/activity. Cues for safety and technique.                PT Education - 11/07/16 1301    Education provided Yes   Education Details PT reviewed pt's HEP exercises and progressed as tolerated.    Person(s) Educated Patient   Methods Explanation;Demonstration;Verbal cues;Handout   Comprehension Returned demonstration;Verbalized understanding             PT Long Term Goals - 11/02/16 1625      PT LONG TERM GOAL #1   Title The patient will be indep with HEP for gaze, motion sensitivity, balance and general mobility.  TARGET DATE FOR ALL LTGS:  12/03/16   Time 4   Period Weeks     PT LONG TERM GOAL #2   Title The patient will improve gait speed from 1.43 ft/sec to > or equal to 2.0 ft/sec to demo dec'ing risk for falls.   Time 4   Period Weeks     PT LONG TERM GOAL #3   Title The patient will tolerate sit<>L sidelying with dizziness < or equal to 2/10 to demo dec'd motion sensitivity.   Time 4   Period Weeks     PT LONG TERM GOAL #4   Title The patient will tolerate gaze x 1 viewing x 30 seconds with nausea and  dizziness < or equal to 2/10.   Time 4   Period Weeks     PT LONG TERM GOAL #5   Title The patient will improve baseline dizziness from 3/10 to 0/10.   Time 4   Period Weeks     Additional Long Term Goals   Additional Long Term Goals Yes     PT LONG TERM GOAL #6   Title Further assess balance and write goals as indicated.   Time 4   Period Weeks               Plan - 11/07/16 1302    Clinical Impression Statement Pt demonstrated progress, as she reported overall dizziness is 1/10 at rest. Pt also tolerated progression of balance  activities to feet together with eyes closed. Pt's s/s appear to be consistent with vestibular hypofunction and pt would continue to benefit from skilled PT to improve safety during functional mobility.    PT Treatment/Interventions ADLs/Self Care Home Management;Canalith Repostioning;Functional mobility training;Stair training;Gait training;Therapeutic activities;Therapeutic exercise;Balance training;Neuromuscular re-education;Vestibular   PT Next Visit Plan review gaze x 1 viewing progress as indicated, continue dynamic gait and balance.   Consulted and Agree with Plan of Care Patient      Patient will benefit from skilled therapeutic intervention in order to improve the following deficits and impairments:  Dizziness, Difficulty walking, Decreased balance, Abnormal gait, Pain, Decreased mobility  Visit Diagnosis: BPPV (benign paroxysmal positional vertigo), left  BPPV (benign paroxysmal positional vertigo), right  Unsteadiness on feet     Problem List Patient Active Problem List   Diagnosis Date Noted  . Benign paroxysmal positional vertigo 10/23/2016  . Headache 06/19/2016  . Cancer of left lung ([place]) 02/03/2016  . COPD GOLD II 12/08/2015  . Solitary pulmonary nodule 12/08/2015  . Bilateral occipital neuralgia 11/22/2015  . Double vision 10/28/2015  . TIA (transient ischemic attack) 10/28/2015  . Subclavian artery stenosis, left  ([place]) 10/28/2015  . Subclavian steal syndrome 10/28/2015  . Acute blood loss anemia 10/20/2013  . C. difficile colitis 10/19/2013  . GI bleed 10/18/2013  . COPD 10/18/2013  . Bleeding gastrointestinal 10/18/2013  . Hypoxia 10/10/2013  . Bronchitis 10/08/2013  . PVD (peripheral vascular disease) with claudication ([place]) 07/29/2013  . Carotid stenosis 05/07/2013  . Stenosis of left carotid artery 05/07/2013  . Occlusion and stenosis of carotid artery without mention of cerebral infarction 03/02/2013  . Carotid artery obstruction 03/02/2013  . Carotid artery disease ([place]) 02/27/2013  . Tobacco abuse 02/27/2013  . Compulsive tobacco user syndrome 02/27/2013  . Claudication ([place]) 01/20/2013  . Essential hypertension 01/20/2013  . Type 2 diabetes mellitus ([place]) 01/20/2013  . Hyperlipidemia 01/20/2013    Lili Harts L 11/07/2016, 1:08 PM  [facility] [address] [place], [place], [address] Phone: [phone]   Fax:  [phone]  Name: TAWANIA DAPONTE MRN: [id number] Date of Birth: [birth year]  Geoffry Paradise, PT,DPT 11/07/16 1:08 PM Phone: [phone] Fax: [phone]

## 2016-11-07 NOTE — Patient Instructions (Addendum)
Head Motion: Side to Side    Sitting, tilt head down slightly, slowly move head to right with eyes open. Hold position until symptoms subside. Then, move head slowly to opposite side. Hold position until symptoms subside. Repeat _5___ times per session. Do _2___ sessions per day.  Copyright  VHI. All rights reserved.   Gaze Stabilization: Sitting    Keeping eyes on target on wall 2-3 feet away, and move head side to side for 10 times.  Do __2-3__ sessions per day.  Copyright  VHI. All rights reserved.   Gaze Stabilization: Tip Card  1.Target must remain in focus, not blurry, and appear stationary while head is in motion. 2.Perform exercises with small head movements (45 to either side of midline). 3.Increase speed of head motion so long as target is in focus. 4.If you wear eyeglasses, be sure you can see target through lens (therapist will give specific instructions for bifocal / progressive lenses). 5.These exercises may provoke dizziness or nausea. Work through these symptoms. If too dizzy, slow head movement slightly. Rest between each exercise. 6.Exercises demand concentration; avoid distractions.  Copyright  VHI. All rights reserved.   Habituation - Tip Card  1.The goal of habituation training is to assist in decreasing symptoms of vertigo, dizziness, or nausea provoked by specific head and body motions. 2.These exercises may initially increase symptoms; however, be persistent and work through symptoms. With repetition and time, the exercises will assist in reducing or eliminating symptoms. 3.Exercises should be stopped and discussed with the therapist if you experience any of the following: - Sudden change or fluctuation in hearing - New onset of ringing in the ears, or increase in current intensity - Any fluid discharge from the ear - Severe pain in neck or back - Extreme nausea  Copyright  VHI. All rights reserved.   Habituation - Sit to Side-Lying   Sit on  edge of bed. Lie down onto the right side and hold until dizziness stops, plus 20 seconds.  Return to sitting and wait until dizziness stops, plus 20 seconds.  Repeat to the left side. Repeat sequence 5 times per session. Do 2 sessions per day.  Copyright  VHI. All rights reserved.    Gaze Stabilization: Tip Card  1.Target must remain in focus, not blurry, and appear stationary while head is in motion. 2.Perform exercises with small head movements (45 to either side of midline). 3.Increase speed of head motion so long as target is in focus. 4.If you wear eyeglasses, be sure you can see target through lens (therapist will give specific instructions for bifocal / progressive lenses). 5.These exercises may provoke dizziness or nausea. Work through these symptoms. If too dizzy, slow head movement slightly. Rest between each exercise. 6.Exercises demand concentration; avoid distractions.  Copyright  VHI. All rights reserved.   Feet Apart, Head Motion - Eyes Open    With eyes open, feet apart, move head slowly SIDE TO SIDE x 5 times.  Do ___2_ sessions per day.  Copyright  VHI. All rights reserved.   Feet Apart, Varied Arm Positions - Eyes Closed    Stand with feet shoulder width apart and arms out. Close eyes and visualize upright position. Hold __30__ seconds. Repeat __3__ times per session. Do 2__ sessions per day. STOP THIS EXERCISE AND PERFORM THE ONE BELOW, WITH FEET TOGETHER. Copyright  VHI. All rights reserved.   Feet Together, Varied Arm Positions - Eyes Closed    Stand with feet together and arms AT YOUR SIDE. Close  eyes and visualize upright position. Hold __10-30__ seconds. Repeat __3__ times per session. Do __1__ sessions per day.  Copyright  VHI. All rights reserved.

## 2016-11-12 ENCOUNTER — Ambulatory Visit: Payer: PPO | Admitting: Physical Therapy

## 2016-11-12 DIAGNOSIS — R29898 Other symptoms and signs involving the musculoskeletal system: Secondary | ICD-10-CM | POA: Diagnosis not present

## 2016-11-12 DIAGNOSIS — R2681 Unsteadiness on feet: Secondary | ICD-10-CM

## 2016-11-12 NOTE — Patient Instructions (Signed)
"  I love a Database administrator    Using a chair if necessary, march in place 4 times in each phase: (1) Foot raised 6" (2) 12" (3) 18" (4) as high as you can. Repeat ___10-15_ times. Do __1-2__ sessions per day. DO VERY SLOWLY TO WORK ON BALANCING ON ONE LEG http://gt2.exer.us/345   Copyright  VHI. All rights reserved.

## 2016-11-13 NOTE — Therapy (Signed)
[facility] [address] [place], [place], [address] Phone: [phone]   Fax:  [phone]  Physical Therapy Treatment  Patient Details  Name: Caitlin Oliver MRN: [id number] Date of Birth: 12-Nov-1940 Referring Provider: Dewayne Shorter PA-C  Encounter Date: 11/12/2016      PT End of Session - 11/13/16 1130    Visit Number 4   Number of Visits 8   Date for PT Re-Evaluation 12/02/16   Authorization Type [employer]   PT Start Time 1022   PT Stop Time 1103   PT Time Calculation (min) 41 min      Past Medical History:  Diagnosis Date  . Anxiety   . Carotid artery disease ([place])   . Claudication ([place])   . COPD (chronic obstructive pulmonary disease) ([place])   . Diabetes mellitus   . Dizzy   . GERD (gastroesophageal reflux disease)   . Headache   . History of kidney stones   . Hypercholesteremia   . Hypertension   . 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 Shortness of breath    with exertion  . Stomach ulcer   . Tobacco abuse     Past Surgical History:  Procedure Laterality Date  . ABDOMINAL HYSTERECTOMY    . APPENDECTOMY    . BREAST REDUCTION SURGERY    . CAROTID ANGIOGRAM N/A 02/25/2013   Procedure: CAROTID ANGIOGRAM;  Surgeon: Lorretta Harp, MD;  Location: [facility] CATH LAB;  Service: Cardiovascular;  Laterality: N/A;  . ENDARTERECTOMY Right 03/06/2013   Procedure: ENDARTERECTOMY CAROTID-RIGHT;  Surgeon: Serafina Mitchell, MD;  Location: [facility];  Service: Vascular;  Laterality: Right;  . ENDARTERECTOMY Left 05/07/2013   Procedure: LEFT CAROTID ARTERY ENDARTERECTOMY WITH VASCU-GUARD PATCH ANGIOPLASTY ;  Surgeon: Serafina Mitchell, MD;  Location: [facility];  Service: Vascular;  Laterality: Left;  . LOBECTOMY Left 02/03/2016   Procedure: LEFT UPPER LOBECTOMY;  Surgeon: [name], MD;  Location: [facility];  Service: Thoracic;  Laterality: Left;  . LOWER  EXTREMITY ANGIOGRAM N/A 02/25/2013   Procedure: LOWER EXTREMITY ANGIOGRAM;  Surgeon: Lorretta Harp, MD;  Location: [facility] CATH LAB;  Service: Cardiovascular;  Laterality: N/A;  . LOWER EXTREMITY ANGIOGRAM N/A 07/27/2013   Procedure: LOWER EXTREMITY ANGIOGRAM;  Surgeon: Lorretta Harp, MD;  Location: [facility] CATH LAB;  Service: Cardiovascular;  Laterality: N/A;  . PATCH ANGIOPLASTY Right 03/06/2013   Procedure: PATCH ANGIOPLASTY of Right Carotid Artery using Vascu-Guard Patch;  Surgeon: Serafina Mitchell, MD;  Location: [facility];  Service: Vascular;  Laterality: Right;  . PERIPHERAL VASCULAR CATHETERIZATION N/A 11/15/2015   Procedure: Aortic Arch Angiography;  Surgeon: Serafina Mitchell, MD;  Location: [facility] CV LAB;  Service: Cardiovascular;  Laterality: N/A;  . PERIPHERAL VASCULAR CATHETERIZATION Bilateral 11/15/2015   Procedure: Carotid Angiography;  Surgeon: Serafina Mitchell, MD;  Location: [facility] CV LAB;  Service: Cardiovascular;  Laterality: Bilateral;  . PERIPHERAL VASCULAR CATHETERIZATION Left 11/15/2015   Procedure: Upper Extremity Angiography;  Surgeon: Serafina Mitchell, MD;  Location: [facility] CV LAB;  Service: Cardiovascular;  Laterality: Left;  . PERIPHERAL VASCULAR CATHETERIZATION Left 11/15/2015   Procedure: Peripheral Vascular Intervention;  Surgeon: Serafina Mitchell, MD;  Location: [facility] CV LAB;  Service: Cardiovascular;  Laterality: Left;  subclavian   . SALIVARY GLAND SURGERY     scar tissue removed from left saliva glad  . TUBAL LIGATION    . VIDEO ASSISTED  THORACOSCOPY (VATS)/WEDGE RESECTION Left 02/03/2016   Procedure: VIDEO ASSISTED THORACOSCOPY;  Surgeon: [name], MD;  Location: [facility];  Service: Thoracic;  Laterality: Left;    There were no vitals filed for this visit.      Subjective Assessment - 11/13/16 1128    Subjective Pt states she is doing much better - had a little vertigo when she lied down in bed last night   Pertinent History MRI reveals R  cerebellar lacunar infarcts and white matter changes.  h/o Migraines, tobacco use,    Patient Stated Goals Get rid of vertigo   Currently in Pain? No/denies                  NeuroRe-ed:  Rt sidelying test (-) with no nystagmus and no c/o vertigo:  Pt did report dizziness with return to upright seated position                        Lt sidelying test (-) with no nystagmus and no c/o vertigo:  Pt did report dizziness with return to upright seated position - same intensity  As from right side  (-) Rt and Lt Dix-Hallpike tests with no nystagmus and no c/o vertigo in test positions            Balance Exercises - 11/13/16 1129      Balance Exercises: Standing   Standing Eyes Opened Narrow base of support (BOS);Solid surface;2 reps;10 secs;Wide (BOA);Foam/compliant surface   Standing Eyes Closed Wide (BOA);Solid surface;Head turns;2 reps   SLS Eyes open;10 secs;Intermittent upper extremity support  with CGA to min assist   Partial Tandem Stance Eyes open;2 reps;30 secs  each position                PT Long Term Goals - 11/02/16 1625      PT LONG TERM GOAL #1   Title The patient will be indep with HEP for gaze, motion sensitivity, balance and general mobility.  TARGET DATE FOR ALL LTGS:  12/03/16   Time 4   Period Weeks     PT LONG TERM GOAL #2   Title The patient will improve gait speed from 1.43 ft/sec to > or equal to 2.0 ft/sec to demo dec'ing risk for falls.   Time 4   Period Weeks     PT LONG TERM GOAL #3   Title The patient will tolerate sit<>L sidelying with dizziness < or equal to 2/10 to demo dec'd motion sensitivity.   Time 4   Period Weeks     PT LONG TERM GOAL #4   Title The patient will tolerate gaze x 1 viewing x 30 seconds with nausea and dizziness < or equal to 2/10.   Time 4   Period Weeks     PT LONG TERM GOAL #5   Title The patient will improve baseline dizziness from 3/10 to 0/10.   Time 4   Period Weeks     Additional Long  Term Goals   Additional Long Term Goals Yes     PT LONG TERM GOAL #6   Title Further assess balance and write goals as indicated.   Time 4   Period Weeks               Plan - 11/13/16 1131    Clinical Impression Statement Pt continues to progress in balance and resolution of dizziness; pt reports some light-headedness with sidelying to sitting positions from both  sides, but denies any true room spinning vertigo; symptoms may be due to some orthostatic hypotension; pt also continues to have vestibular hypofunction resulting in unsteadiness with EC and also with head turns   Rehab Potential Good   PT Frequency 2x / week   PT Duration 4 weeks   PT Treatment/Interventions ADLs/Self Care Home Management;Canalith Repostioning;Functional mobility training;Stair training;Gait training;Therapeutic activities;Therapeutic exercise;Balance training;Neuromuscular re-education;Vestibular   PT Next Visit Plan check orthostatics if pt cont to c/o dizziness: D/C next session?   Consulted and Agree with Plan of Care Patient      Patient will benefit from skilled therapeutic intervention in order to improve the following deficits and impairments:  Dizziness, Difficulty walking, Decreased balance, Abnormal gait, Pain, Decreased mobility  Visit Diagnosis: Unsteadiness on feet     Problem List Patient Active Problem List   Diagnosis Date Noted  . Benign paroxysmal positional vertigo 10/23/2016  . Headache 06/19/2016  . Cancer of left lung ([place]) 02/03/2016  . COPD GOLD II 12/08/2015  . Solitary pulmonary nodule 12/08/2015  . Bilateral occipital neuralgia 11/22/2015  . Double vision 10/28/2015  . TIA (transient ischemic attack) 10/28/2015  . Subclavian artery stenosis, left ([place]) 10/28/2015  . Subclavian steal syndrome 10/28/2015  . Acute blood loss anemia 10/20/2013  . C. difficile colitis 10/19/2013  . GI bleed 10/18/2013  . COPD 10/18/2013  . Bleeding gastrointestinal 10/18/2013  .  Hypoxia 10/10/2013  . Bronchitis 10/08/2013  . PVD (peripheral vascular disease) with claudication ([place]) 07/29/2013  . Carotid stenosis 05/07/2013  . Stenosis of left carotid artery 05/07/2013  . Occlusion and stenosis of carotid artery without mention of cerebral infarction 03/02/2013  . Carotid artery obstruction 03/02/2013  . Carotid artery disease ([place]) 02/27/2013  . Tobacco abuse 02/27/2013  . Compulsive tobacco user syndrome 02/27/2013  . Claudication ([place]) 01/20/2013  . Essential hypertension 01/20/2013  . Type 2 diabetes mellitus ([place]) 01/20/2013  . Hyperlipidemia 01/20/2013    Alda Lea, PT 11/13/2016, 11:36 AM  [facility] [address] [place], [place], [address] Phone: [phone]   Fax:  [phone]  Name: Caitlin Oliver MRN: [id number] Date of Birth: 1940-08-24

## 2016-11-19 ENCOUNTER — Ambulatory Visit: Payer: PPO | Attending: Otolaryngology | Admitting: Physical Therapy

## 2016-11-19 DIAGNOSIS — R2681 Unsteadiness on feet: Secondary | ICD-10-CM | POA: Diagnosis not present

## 2016-11-20 NOTE — Therapy (Signed)
[facility] [address] [place], [place], [address] Phone: [phone]   Fax:  [phone]  Physical Therapy Treatment  Patient Details  Name: Caitlin Oliver MRN: [id number] Date of Birth: [birth year] Referring Provider: Dewayne Shorter PA-C  Encounter Date: 11/19/2016      PT End of Session - 11/20/16 2244    Visit Number 5   Number of Visits 8   Date for PT Re-Evaluation 12/02/16   Authorization Type [employer]   Authorization Time Period 08-20-16 - 10-19-16   PT Start Time 1317   PT Stop Time 1400   PT Time Calculation (min) 43 min      Past Medical History:  Diagnosis Date  . Anxiety   . Carotid artery disease ([place])   . Claudication ([place])   . COPD (chronic obstructive pulmonary disease) ([place])   . Diabetes mellitus   . Dizzy   . GERD (gastroesophageal reflux disease)   . Headache   . History of kidney stones   . Hypercholesteremia   . Hypertension   . 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 Shortness of breath    with exertion  . Stomach ulcer   . Tobacco abuse     Past Surgical History:  Procedure Laterality Date  . ABDOMINAL HYSTERECTOMY    . APPENDECTOMY    . BREAST REDUCTION SURGERY    . CAROTID ANGIOGRAM N/A 02/25/2013   Procedure: CAROTID ANGIOGRAM;  Surgeon: Lorretta Harp, MD;  Location: [facility] CATH LAB;  Service: Cardiovascular;  Laterality: N/A;  . ENDARTERECTOMY Right 03/06/2013   Procedure: ENDARTERECTOMY CAROTID-RIGHT;  Surgeon: Serafina Mitchell, MD;  Location: [facility];  Service: Vascular;  Laterality: Right;  . ENDARTERECTOMY Left 05/07/2013   Procedure: LEFT CAROTID ARTERY ENDARTERECTOMY WITH VASCU-GUARD PATCH ANGIOPLASTY ;  Surgeon: Serafina Mitchell, MD;  Location: [facility];  Service: Vascular;  Laterality: Left;  . LOBECTOMY Left 02/03/2016   Procedure: LEFT UPPER LOBECTOMY;  Surgeon: [name], MD;  Location: [facility];   Service: Thoracic;  Laterality: Left;  . LOWER EXTREMITY ANGIOGRAM N/A 02/25/2013   Procedure: LOWER EXTREMITY ANGIOGRAM;  Surgeon: Lorretta Harp, MD;  Location: [facility] CATH LAB;  Service: Cardiovascular;  Laterality: N/A;  . LOWER EXTREMITY ANGIOGRAM N/A 07/27/2013   Procedure: LOWER EXTREMITY ANGIOGRAM;  Surgeon: Lorretta Harp, MD;  Location: [facility] CATH LAB;  Service: Cardiovascular;  Laterality: N/A;  . PATCH ANGIOPLASTY Right 03/06/2013   Procedure: PATCH ANGIOPLASTY of Right Carotid Artery using Vascu-Guard Patch;  Surgeon: Serafina Mitchell, MD;  Location: [facility];  Service: Vascular;  Laterality: Right;  . PERIPHERAL VASCULAR CATHETERIZATION N/A 11/15/2015   Procedure: Aortic Arch Angiography;  Surgeon: Serafina Mitchell, MD;  Location: [place] CV LAB;  Service: Cardiovascular;  Laterality: N/A;  . PERIPHERAL VASCULAR CATHETERIZATION Bilateral 11/15/2015   Procedure: Carotid Angiography;  Surgeon: Serafina Mitchell, MD;  Location: [facility] CV LAB;  Service: Cardiovascular;  Laterality: Bilateral;  . PERIPHERAL VASCULAR CATHETERIZATION Left 11/15/2015   Procedure: Upper Extremity Angiography;  Surgeon: Serafina Mitchell, MD;  Location: [facility] CV LAB;  Service: Cardiovascular;  Laterality: Left;  . PERIPHERAL VASCULAR CATHETERIZATION Left 11/15/2015   Procedure: Peripheral Vascular Intervention;  Surgeon: Serafina Mitchell, MD;  Location: [facility] CV LAB;  Service: Cardiovascular;  Laterality: Left;  subclavian   . SALIVARY GLAND SURGERY     scar tissue removed from left saliva glad  .  TUBAL LIGATION    . VIDEO ASSISTED THORACOSCOPY (VATS)/WEDGE RESECTION Left 02/03/2016   Procedure: VIDEO ASSISTED THORACOSCOPY;  Surgeon: [name], MD;  Location: [facility];  Service: Thoracic;  Laterality: Left;    There were no vitals filed for this visit.                                    PT Long Term Goals - 09-Dec-2016 1320      PT LONG TERM GOAL #1   Title The  patient will be indep with HEP for gaze, motion sensitivity, balance and general mobility.  TARGET DATE FOR ALL LTGS:  12/03/16   Status Achieved     PT LONG TERM GOAL #2   Title The patient will improve gait speed from 1.43 ft/sec to > or equal to 2.0 ft/sec to demo dec'ing risk for falls.   Baseline  = 4.65 = 7.06     PT LONG TERM GOAL #3   Title The patient will tolerate sit<>L sidelying with dizziness < or equal to 2/10 to demo dec'd motion sensitivity.   Status Achieved     PT LONG TERM GOAL #4   Title The patient will tolerate gaze x 1 viewing x 30 seconds with nausea and dizziness < or equal to 2/10.   Status Achieved     PT LONG TERM GOAL #5   Title The patient will improve baseline dizziness from 3/10 to 0/10.   Baseline Pt rates dizziness 1/10 at this time - due to looking at TV in lobby   Status Partially Met     PT LONG TERM GOAL #6   Title Further assess balance and write goals as indicated.   Status Achieved             Patient will benefit from skilled therapeutic intervention in order to improve the following deficits and impairments:     Visit Diagnosis: Unsteadiness on feet       G-Codes - 2016-12-09 2245    Functional Assessment Tool Used (Outpatient Only) pt reports very minimal dizziness at this time   Functional Limitation Mobility: Walking and moving around   Mobility: Walking and Moving Around Goal Status [phone]) At least 20 percent but less than 40 percent impaired, limited or restricted   Mobility: Walking and Moving Around Discharge Status [phone]) At least 1 percent but less than 20 percent impaired, limited or restricted      Problem List Patient Active Problem List   Diagnosis Date Noted  . Benign paroxysmal positional vertigo 10/23/2016  . Headache 06/19/2016  . Cancer of left lung ([place]) 02/03/2016  . COPD GOLD II 12/08/2015  . Solitary pulmonary nodule 12/08/2015  . Bilateral occipital neuralgia 11/22/2015  . Double vision  10/28/2015  . TIA (transient ischemic attack) 10/28/2015  . Subclavian artery stenosis, left ([place]) 10/28/2015  . Subclavian steal syndrome 10/28/2015  . Acute blood loss anemia 10/20/2013  . C. difficile colitis 10/19/2013  . GI bleed 10/18/2013  . COPD 10/18/2013  . Bleeding gastrointestinal 10/18/2013  . Hypoxia 10/10/2013  . Bronchitis 10/08/2013  . PVD (peripheral vascular disease) with claudication ([place]) 07/29/2013  . Carotid stenosis 05/07/2013  . Stenosis of left carotid artery 05/07/2013  . Occlusion and stenosis of carotid artery without mention of cerebral infarction 03/02/2013  . Carotid artery obstruction 03/02/2013  . Carotid artery disease ([place]) 02/27/2013  . Tobacco abuse 02/27/2013  .  Compulsive tobacco user syndrome 02/27/2013  . Claudication ([place]) 01/20/2013  . Essential hypertension 01/20/2013  . Type 2 diabetes mellitus ([place]) 01/20/2013  . Hyperlipidemia 01/20/2013    Alda Lea, PT 11/20/2016, 10:47 PM  [facility] [address] [place], [place], [address] Phone: [phone]   Fax:  [phone]  Name: Caitlin Oliver MRN: [id number] Date of Birth: Sep 23, 1940

## 2016-11-21 NOTE — Therapy (Signed)
[facility] [address] [place], [place], [address] Phone: [phone]   Fax:  [phone]  Physical Therapy Treatment  Patient Details  Name: Caitlin Oliver MRN: [id number] Date of Birth: [birth year] Referring Provider: Dewayne Shorter PA-C  Encounter Date: 11/19/2016      PT End of Session - 11/20/16 2244    Visit Number 5   Number of Visits 8   Date for PT Re-Evaluation 12/02/16   Authorization Type [employer]   Authorization Time Period 08-20-16 - 10-19-16   PT Start Time 1317   PT Stop Time 1400   PT Time Calculation (min) 43 min      Past Medical History:  Diagnosis Date  . Anxiety   . Carotid artery disease ([place])   . Claudication ([place])   . COPD (chronic obstructive pulmonary disease) ([place])   . Diabetes mellitus   . Dizzy   . GERD (gastroesophageal reflux disease)   . Headache   . History of kidney stones   . Hypercholesteremia   . Hypertension   . 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 Shortness of breath    with exertion  . Stomach ulcer   . Tobacco abuse     Past Surgical History:  Procedure Laterality Date  . ABDOMINAL HYSTERECTOMY    . APPENDECTOMY    . BREAST REDUCTION SURGERY    . CAROTID ANGIOGRAM N/A 02/25/2013   Procedure: CAROTID ANGIOGRAM;  Surgeon: Lorretta Harp, MD;  Location: [facility] CATH LAB;  Service: Cardiovascular;  Laterality: N/A;  . ENDARTERECTOMY Right 03/06/2013   Procedure: ENDARTERECTOMY CAROTID-RIGHT;  Surgeon: Serafina Mitchell, MD;  Location: [facility];  Service: Vascular;  Laterality: Right;  . ENDARTERECTOMY Left 05/07/2013   Procedure: LEFT CAROTID ARTERY ENDARTERECTOMY WITH VASCU-GUARD PATCH ANGIOPLASTY ;  Surgeon: Serafina Mitchell, MD;  Location: [place];  Service: Vascular;  Laterality: Left;  . LOBECTOMY Left 02/03/2016   Procedure: LEFT UPPER LOBECTOMY;  Surgeon: [name], MD;  Location: [facility];   Service: Thoracic;  Laterality: Left;  . LOWER EXTREMITY ANGIOGRAM N/A 02/25/2013   Procedure: LOWER EXTREMITY ANGIOGRAM;  Surgeon: Lorretta Harp, MD;  Location: [facility] CATH LAB;  Service: Cardiovascular;  Laterality: N/A;  . LOWER EXTREMITY ANGIOGRAM N/A 07/27/2013   Procedure: LOWER EXTREMITY ANGIOGRAM;  Surgeon: Lorretta Harp, MD;  Location: [facility] CATH LAB;  Service: Cardiovascular;  Laterality: N/A;  . PATCH ANGIOPLASTY Right 03/06/2013   Procedure: PATCH ANGIOPLASTY of Right Carotid Artery using Vascu-Guard Patch;  Surgeon: Serafina Mitchell, MD;  Location: [facility] [place];  Service: Vascular;  Laterality: Right;  . PERIPHERAL VASCULAR CATHETERIZATION N/A 11/15/2015   Procedure: Aortic Arch Angiography;  Surgeon: Serafina Mitchell, MD;  Location: [facility] CV LAB;  Service: Cardiovascular;  Laterality: N/A;  . PERIPHERAL VASCULAR CATHETERIZATION Bilateral 11/15/2015   Procedure: Carotid Angiography;  Surgeon: Serafina Mitchell, MD;  Location: [facility] CV LAB;  Service: Cardiovascular;  Laterality: Bilateral;  . PERIPHERAL VASCULAR CATHETERIZATION Left 11/15/2015   Procedure: Upper Extremity Angiography;  Surgeon: Serafina Mitchell, MD;  Location: [facility] CV LAB;  Service: Cardiovascular;  Laterality: Left;  . PERIPHERAL VASCULAR CATHETERIZATION Left 11/15/2015   Procedure: Peripheral Vascular Intervention;  Surgeon: Serafina Mitchell, MD;  Location: [place] CV LAB;  Service: Cardiovascular;  Laterality: Left;  subclavian   . SALIVARY GLAND SURGERY     scar tissue removed from left saliva glad  .  TUBAL LIGATION    . VIDEO ASSISTED THORACOSCOPY (VATS)/WEDGE RESECTION Left 02/03/2016   Procedure: VIDEO ASSISTED THORACOSCOPY;  Surgeon: [name], MD;  Location: [facility];  Service: Thoracic;  Laterality: Left;    There were no vitals filed for this visit.      Subjective Assessment - 11/21/16 1044    Subjective Pt states she is doing really well - polished her bedroom furniture this weekend and did  not have much vertigo at all   Pertinent History MRI reveals R cerebellar lacunar infarcts and white matter changes.  h/o Migraines, tobacco use,    Patient Stated Goals Get rid of vertigo   Currently in Pain? No/denies                         [facility] Adult PT Treatment/Exercise - 11/21/16 0001      Ambulation/Gait   Ambulation/Gait Yes   Ambulation/Gait Assistance 6: Modified independent (Device/Increase time)   Ambulation Distance (Feet) 100 Feet   Assistive device None   Gait Pattern Within Functional Limits   Ambulation Surface Level;Indoor   Gait velocity 7.06 secs = 4.65 ft/sec             Balance Exercises - 11/21/16 1045      Balance Exercises: Standing   Standing Eyes Opened Narrow base of support (BOS);Solid surface;2 reps;10 secs;Wide (BOA);Foam/compliant surface   Standing Eyes Closed Wide (BOA);Solid surface;Head turns;2 reps   SLS Eyes open;10 secs;Intermittent upper extremity support  with CGA to min assist   Partial Tandem Stance Eyes open;2 reps;30 secs  each position   Other Standing Exercises Marching on foam with EO with CGA;  Reviewed HEP ;  pt performed sit to stand without UE support x 5 reps                PT Long Term Goals - 11/21/16 1046      PT LONG TERM GOAL #1   Title The patient will be indep with HEP for gaze, motion sensitivity, balance and general mobility.  TARGET DATE FOR ALL LTGS:  12/03/16   Baseline met 11-19-16   Status Achieved     PT LONG TERM GOAL #2   Title The patient will improve gait speed from 1.43 ft/sec to > or equal to 2.0 ft/sec to demo dec'ing risk for falls.   Baseline  = 4.65 ft/sec  = 7.06 secs   Status Achieved     PT LONG TERM GOAL #3   Title The patient will tolerate sit<>L sidelying with dizziness < or equal to 2/10 to demo dec'd motion sensitivity.   Baseline met 11-19-16   Status Achieved     PT LONG TERM GOAL #4   Title The patient will tolerate gaze x 1 viewing x 30 seconds with  nausea and dizziness < or equal to 2/10.   Baseline met 11-19-16   Status Achieved     PT LONG TERM GOAL #6   Title Further assess balance and write goals as indicated.   Status Achieved               Plan - 11/21/16 1048    Clinical Impression Statement Pt has met all LTG's - reports only mild dizziness which occurs intermittently; pt reports she is doing fine and is ready for D/C   Rehab Potential Good   PT Frequency 2x / week   PT Duration 4 weeks   PT Treatment/Interventions ADLs/Self Care Home  Management;Canalith Repostioning;Functional mobility training;Stair training;Gait training;Therapeutic activities;Therapeutic exercise;Balance training;Neuromuscular re-education;Vestibular   PT Next Visit Plan N/A - D/C   Consulted and Agree with Plan of Care Patient      Patient will benefit from skilled therapeutic intervention in order to improve the following deficits and impairments:  Dizziness, Difficulty walking, Decreased balance, Abnormal gait, Pain, Decreased mobility  Visit Diagnosis: Unsteadiness on feet     Problem List Patient Active Problem List   Diagnosis Date Noted  . Benign paroxysmal positional vertigo 10/23/2016  . Headache 06/19/2016  . Cancer of left lung ([place]) 02/03/2016  . COPD GOLD II 12/08/2015  . Solitary pulmonary nodule 12/08/2015  . Bilateral occipital neuralgia 11/22/2015  . Double vision 10/28/2015  . TIA (transient ischemic attack) 10/28/2015  . Subclavian artery stenosis, left ([place]) 10/28/2015  . Subclavian steal syndrome 10/28/2015  . Acute blood loss anemia 10/20/2013  . C. difficile colitis 10/19/2013  . GI bleed 10/18/2013  . COPD 10/18/2013  . Bleeding gastrointestinal 10/18/2013  . Hypoxia 10/10/2013  . Bronchitis 10/08/2013  . PVD (peripheral vascular disease) with claudication ([place]) 07/29/2013  . Carotid stenosis 05/07/2013  . Stenosis of left carotid artery 05/07/2013  . Occlusion and stenosis of carotid artery without  mention of cerebral infarction 03/02/2013  . Carotid artery obstruction 03/02/2013  . Carotid artery disease ([place]) 02/27/2013  . Tobacco abuse 02/27/2013  . Compulsive tobacco user syndrome 02/27/2013  . Claudication ([place]) 01/20/2013  . Essential hypertension 01/20/2013  . Type 2 diabetes mellitus ([place]) 01/20/2013  . Hyperlipidemia 01/20/2013   PHYSICAL THERAPY DISCHARGE SUMMARY  Visits from Start of Care:  5  Current functional level related to goals / functional outcomes: See above for progress towards goals - all goals met   Remaining deficits: C/o mild intermittent vertigo which pt states only lasts for few seconds - possibly due to changes in BP with transitional movements, I.e supine or sidelying to sitting   Education / Equipment: Pt has been instructed in a HEP consisting of balance and Brandt-Daroff exercises - pt reports compliance with this program Plan: Patient agrees to discharge.  Patient goals were met. Patient is being discharged due to meeting the stated rehab goals.  ?????       Alda Lea, PT 11/21/2016, 10:50 AM  [facility] [address] [place], [place], [address] Phone: [phone]   Fax:  [phone]  Name: Caitlin Oliver MRN: [id number] Date of Birth: [birth year]

## 2016-11-22 ENCOUNTER — Encounter: Payer: PPO | Admitting: Physical Therapy

## 2016-11-27 ENCOUNTER — Encounter: Payer: PPO | Admitting: Physical Therapy

## 2016-11-29 ENCOUNTER — Encounter: Payer: PPO | Admitting: Rehabilitative and Restorative Service Providers"

## 2016-12-03 ENCOUNTER — Encounter: Payer: PPO | Admitting: Rehabilitative and Restorative Service Providers"

## 2016-12-06 ENCOUNTER — Encounter: Payer: PPO | Admitting: Physical Therapy

## 2016-12-06 IMAGING — CR DG CHEST 2V
2 series · 2 of 2 positions shown · non-contrast
Comparison: 11/07/2015

CLINICAL DATA: Preoperative evaluation for upcoming lung surgery

EXAM:
CHEST  2 VIEW

[w chest pa]
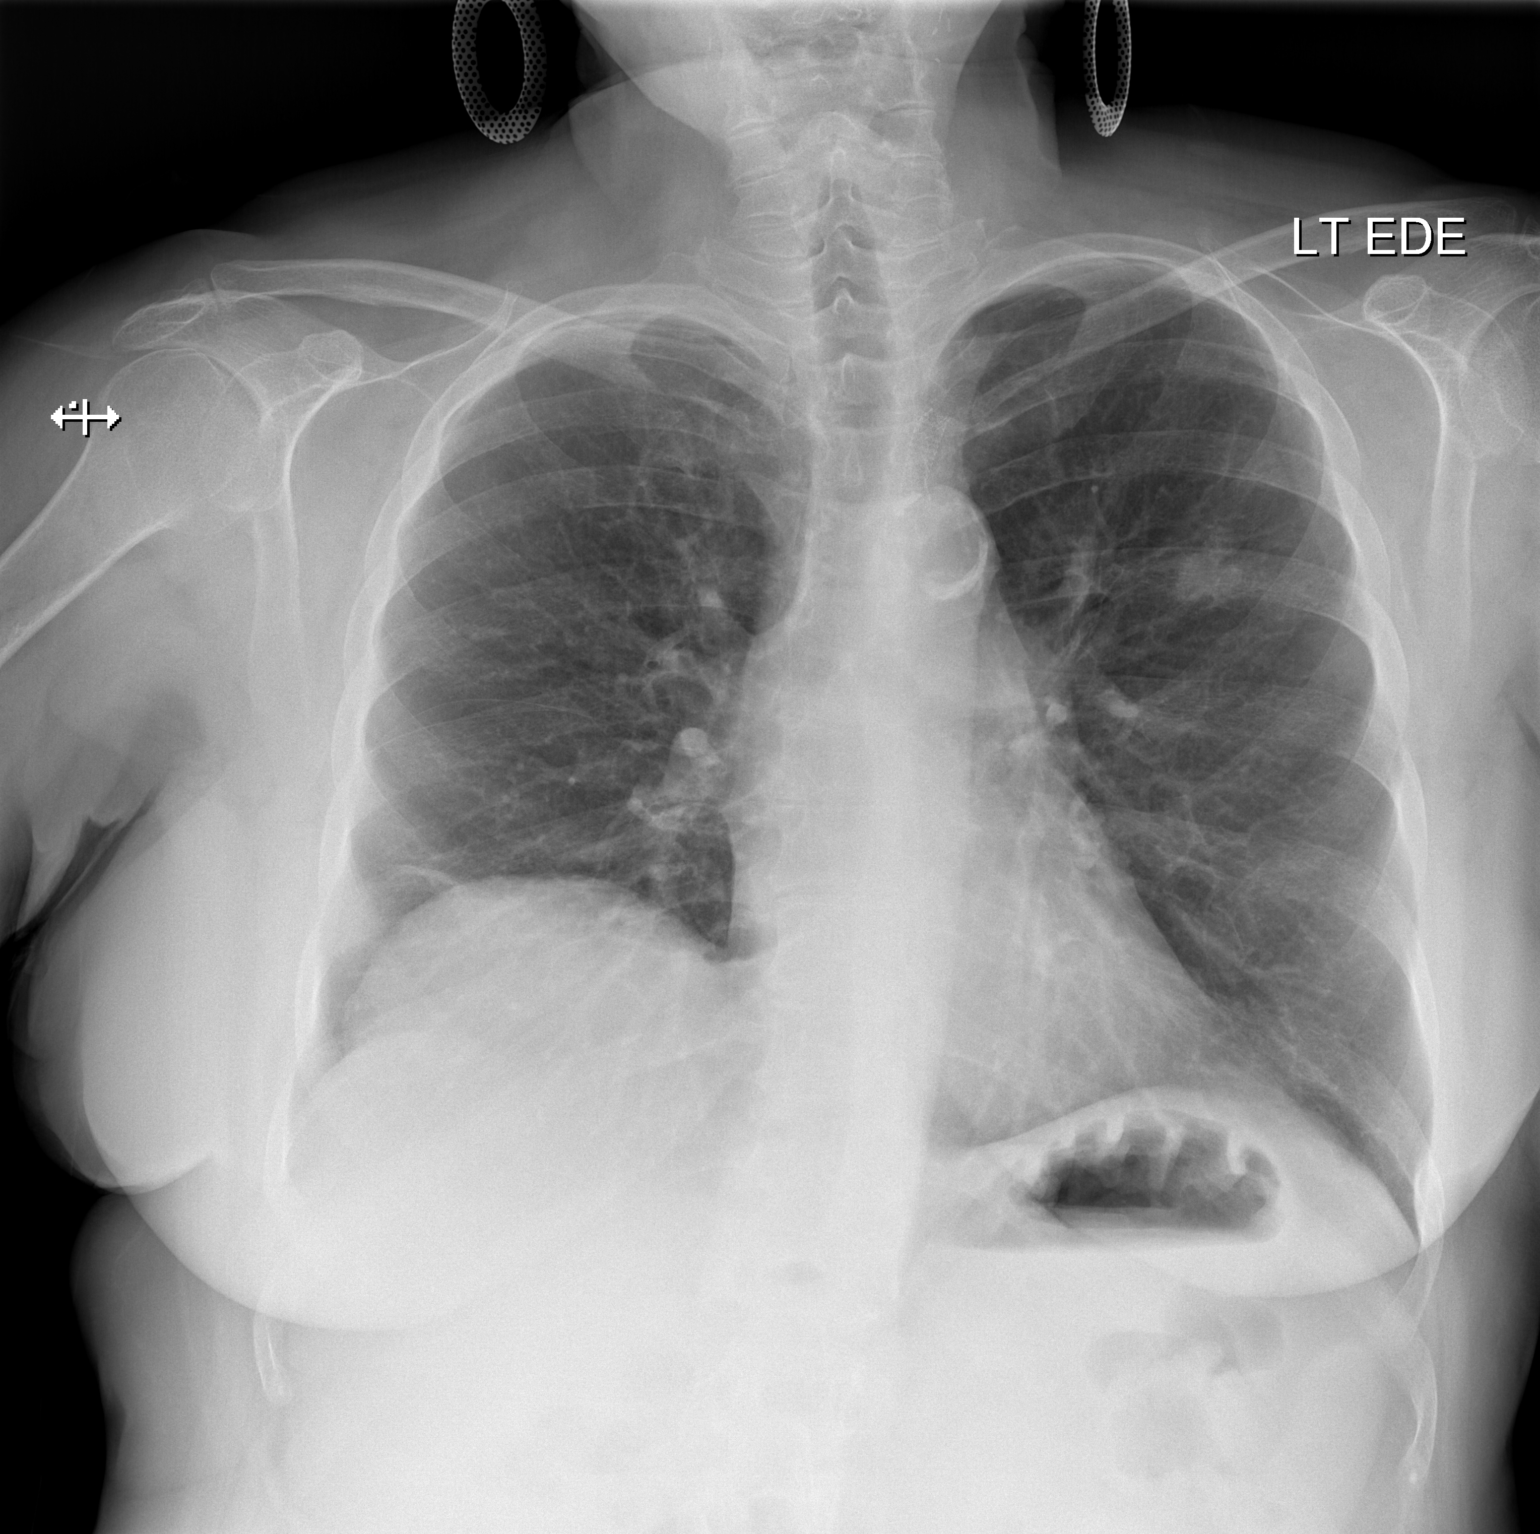

[w chest lat]
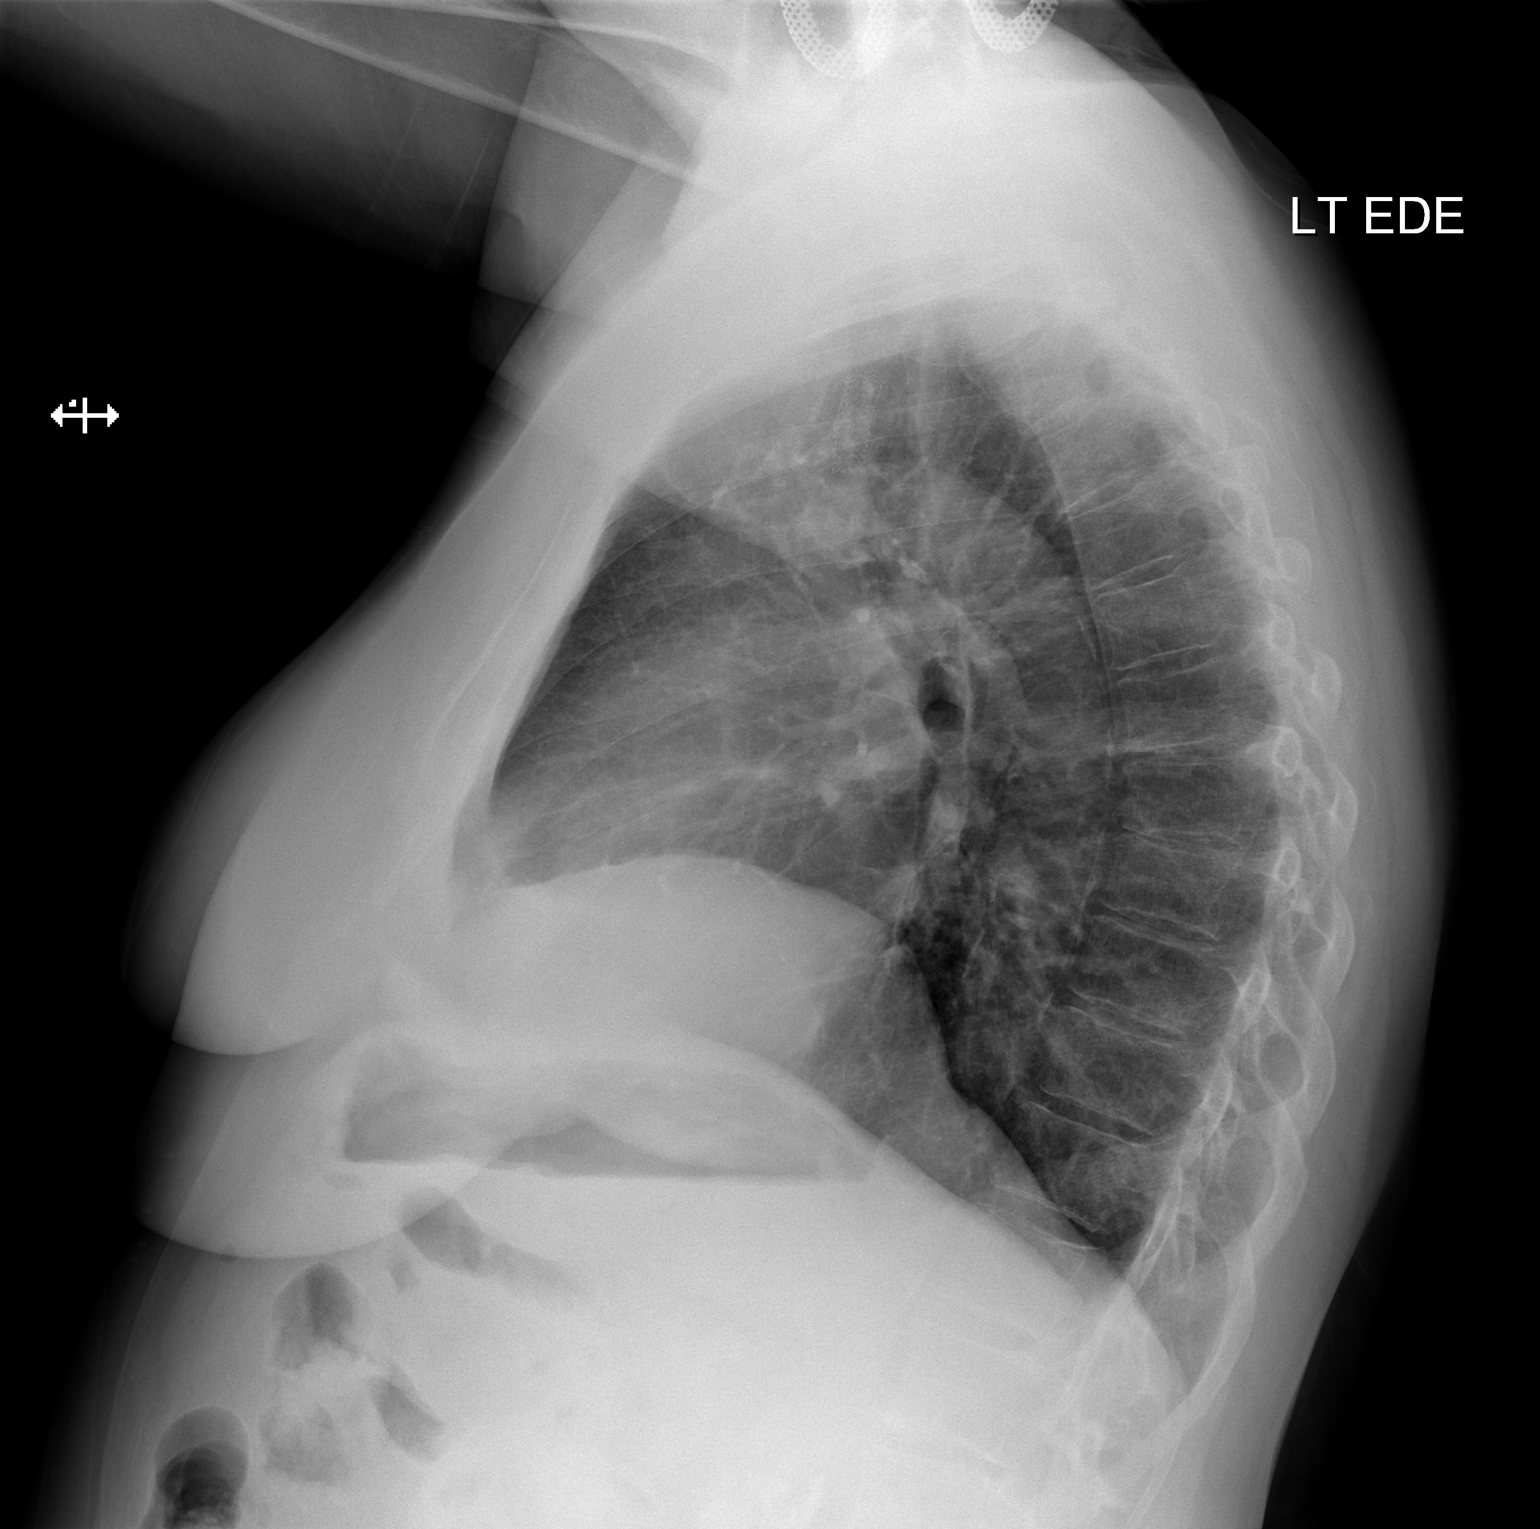

[2 of 2 positions shown; findings below may reference images not displayed]

FINDINGS: Cardiac shadow is within normal limits. Elevation of the right
hemidiaphragm is again noted. Left upper lobe nodule is again seen
and stable. No new focal abnormality is seen
IMPRESSION: Stable left upper lobe nodule

## 2016-12-09 IMAGING — DX DG CHEST 1V PORT
1 series · 1 of 1 positions shown · non-contrast
Comparison: 02/03/2016

CLINICAL DATA: 75-year-old female with shortness breath.
Pneumothorax. Subsequent encounter.

EXAM:
PORTABLE CHEST 1 VIEW

[chest ap]
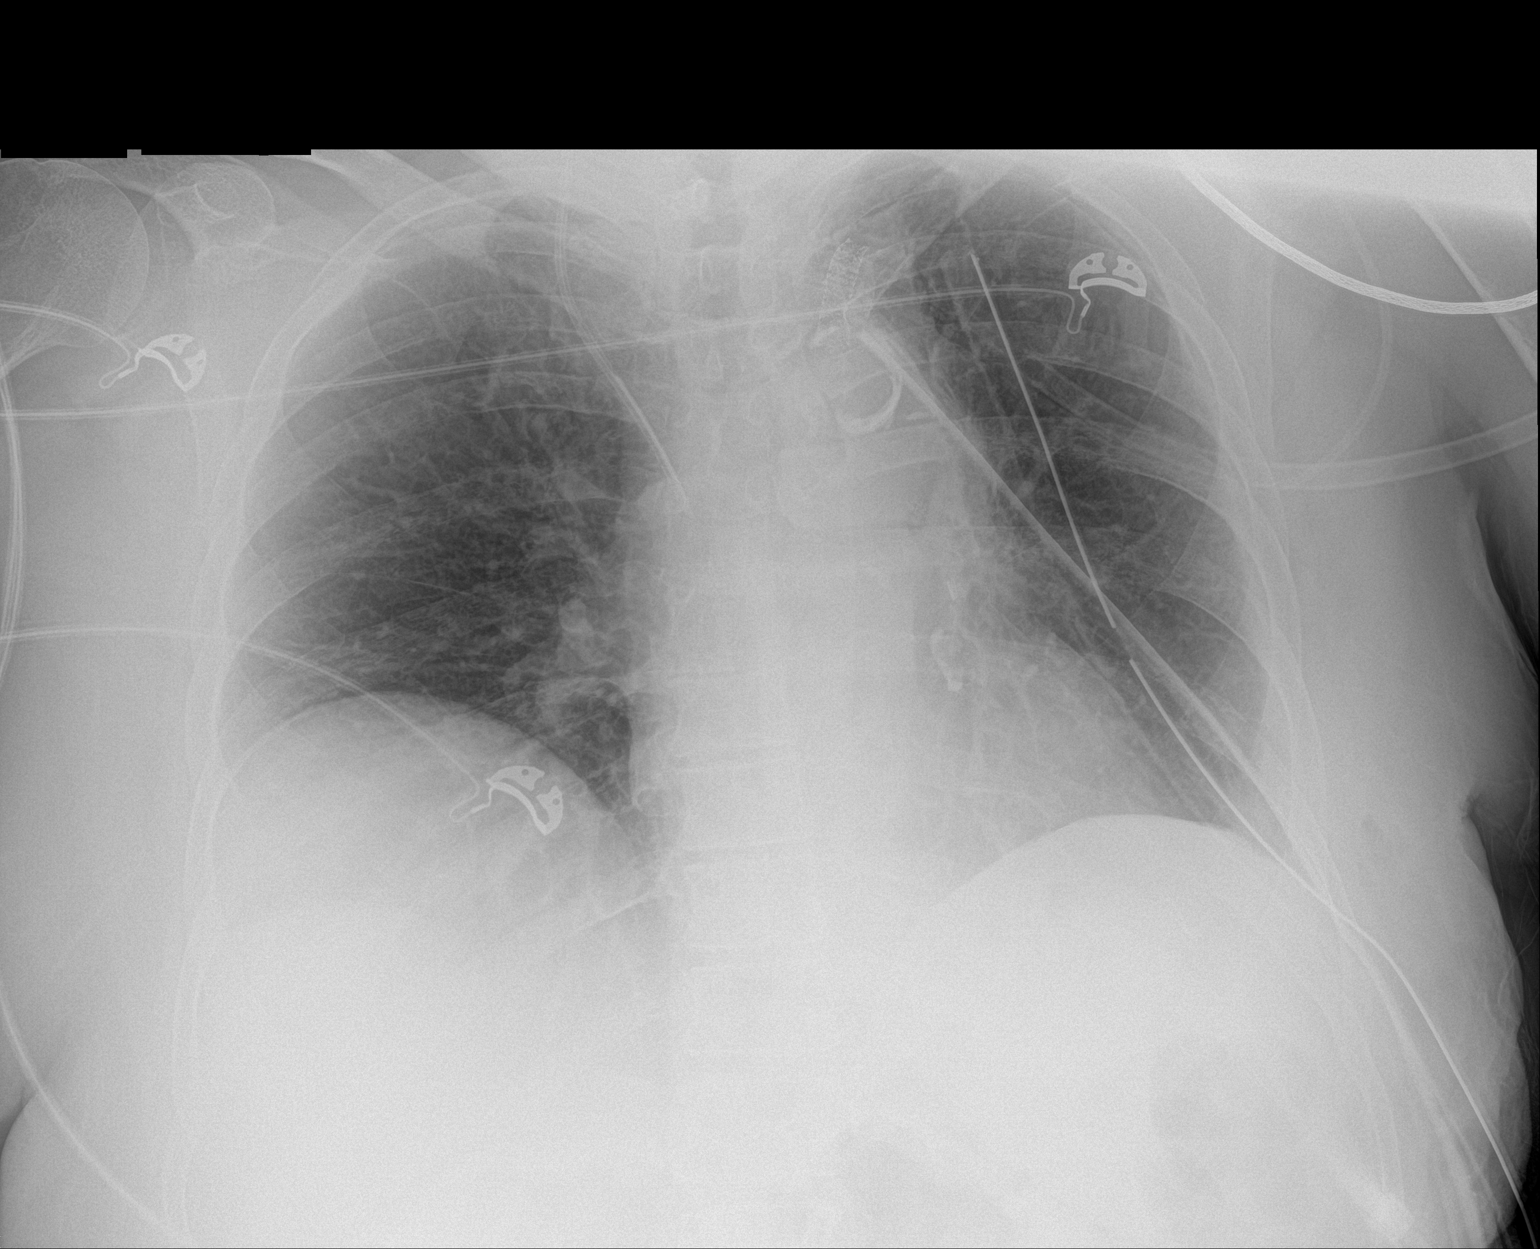

[1 of 1 positions shown; findings below may reference images not displayed]

FINDINGS: Left side chest tubes in place. Tiny left apical pneumothorax
unchanged in size (less than 5%).

Right central line tip proximal to mid superior vena cava level.
Similar appearance of mediastinal and cardiac silhouette.

Decrease in degree of central pulmonary vascular congestion.

Stenting subclavian artery.
IMPRESSION: Left side chest tubes in place. Tiny left apical pneumothorax
unchanged in size (less than 5%).

Decrease in degree of central pulmonary vascular congestion.

## 2016-12-10 IMAGING — CR DG CHEST 1V PORT
1 series · 1 of 1 positions shown · non-contrast
Comparison: 02/04/2016

CLINICAL DATA: Followup pneumothorax

EXAM:
PORTABLE CHEST 1 VIEW

[AP]
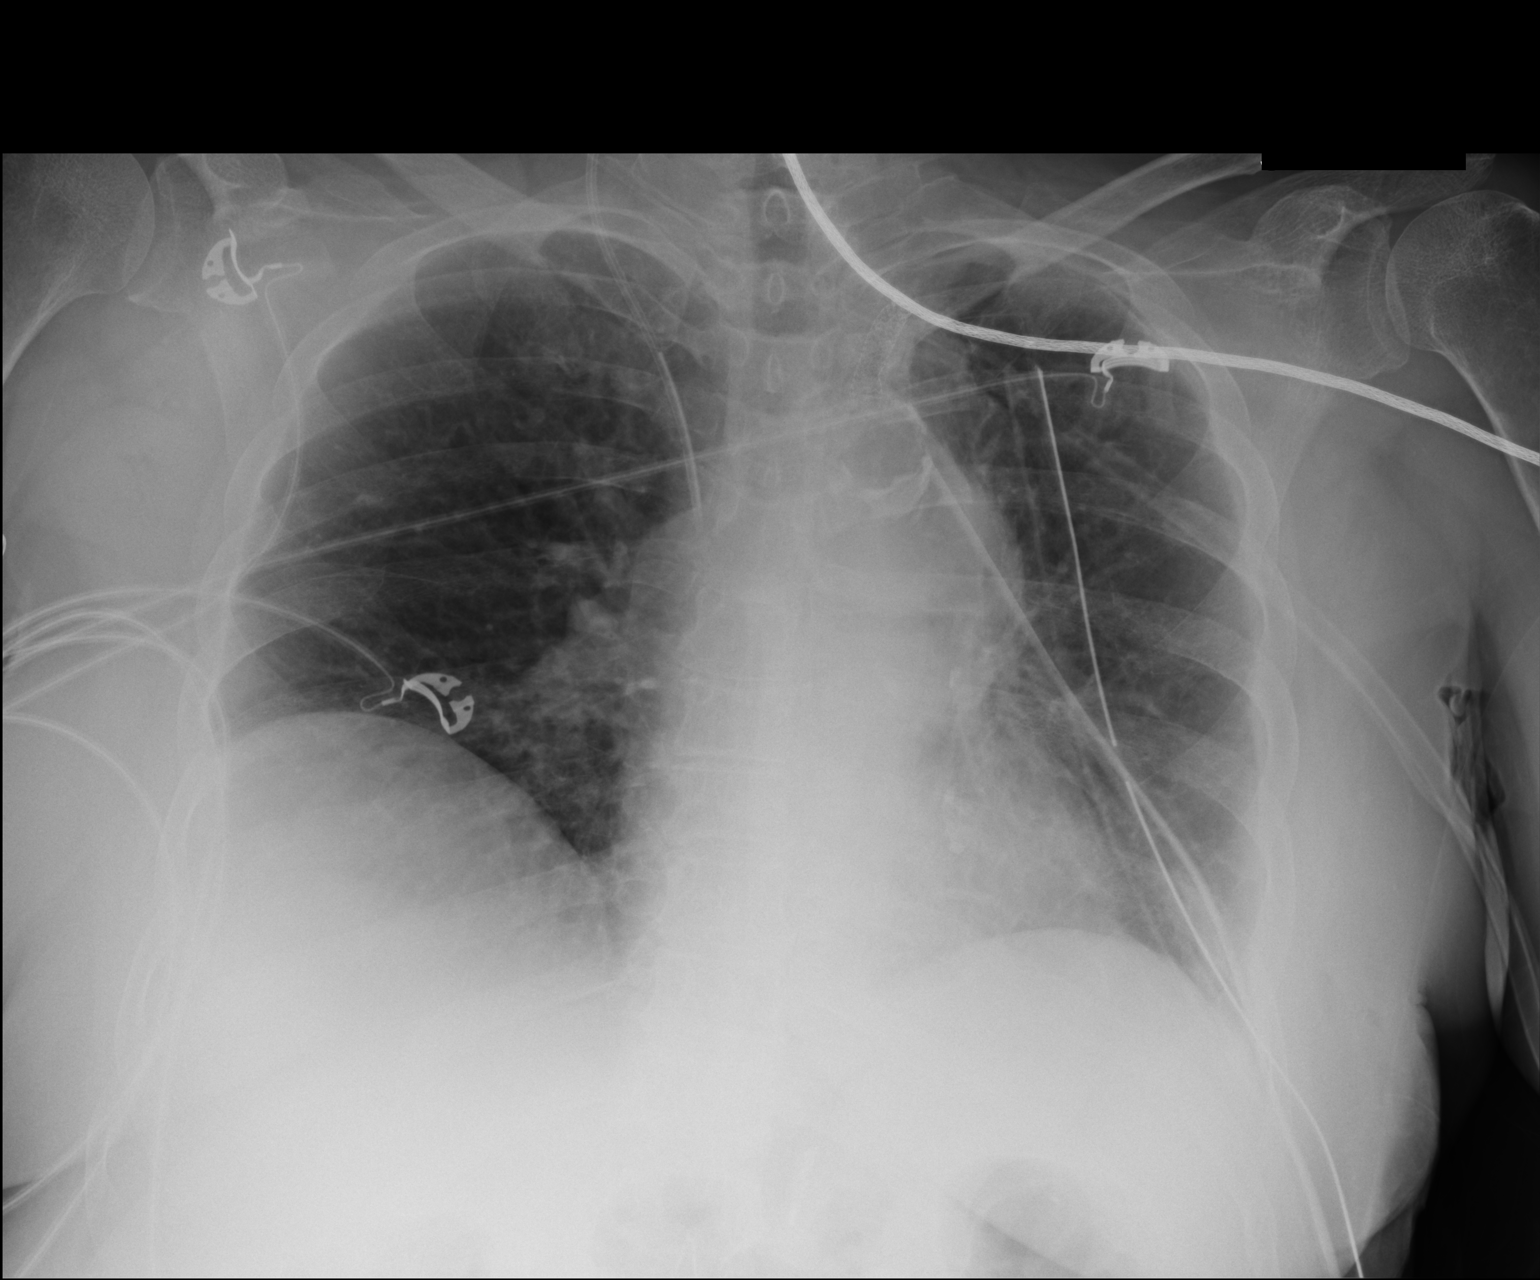

[1 of 1 positions shown; findings below may reference images not displayed]

FINDINGS: Two thoracostomy catheters are noted on the left. Right jugular
central line is seen. Cardiac shadow is stable. The lungs are
hypoinflated but stable from the prior study. The previously seen
left pneumothorax has reduced somewhat in size. No new focal
abnormality is noted.
IMPRESSION: Slight reduction in left pneumothorax.

## 2016-12-20 DIAGNOSIS — E7800 Pure hypercholesterolemia, unspecified: Secondary | ICD-10-CM | POA: Diagnosis not present

## 2016-12-20 DIAGNOSIS — E119 Type 2 diabetes mellitus without complications: Secondary | ICD-10-CM | POA: Diagnosis not present

## 2016-12-20 DIAGNOSIS — F419 Anxiety disorder, unspecified: Secondary | ICD-10-CM | POA: Diagnosis not present

## 2016-12-20 DIAGNOSIS — I10 Essential (primary) hypertension: Secondary | ICD-10-CM | POA: Diagnosis not present

## 2016-12-25 ENCOUNTER — Other Ambulatory Visit: Payer: Self-pay

## 2016-12-25 DIAGNOSIS — R911 Solitary pulmonary nodule: Secondary | ICD-10-CM

## 2016-12-25 NOTE — Progress Notes (Unsigned)
CXR

## 2017-01-01 ENCOUNTER — Encounter: Payer: Self-pay | Admitting: Thoracic Surgery (Cardiothoracic Vascular Surgery)

## 2017-01-01 ENCOUNTER — Ambulatory Visit (INDEPENDENT_AMBULATORY_CARE_PROVIDER_SITE_OTHER): Payer: PPO | Admitting: Thoracic Surgery (Cardiothoracic Vascular Surgery)

## 2017-01-01 ENCOUNTER — Ambulatory Visit
Admission: RE | Admit: 2017-01-01 | Discharge: 2017-01-01 | Disposition: A | Discharge status: Home / Self Care | Payer: PPO | Source: Ambulatory Visit | Attending: Thoracic Surgery (Cardiothoracic Vascular Surgery) | Admitting: Thoracic Surgery (Cardiothoracic Vascular Surgery)

## 2017-01-01 VITALS — BP 146/81 | HR 88 | Resp 18 | Ht 64.0 in | Wt 187.0 lb

## 2017-01-01 DIAGNOSIS — R079 Chest pain, unspecified: Secondary | ICD-10-CM | POA: Diagnosis not present

## 2017-01-01 DIAGNOSIS — R911 Solitary pulmonary nodule: Secondary | ICD-10-CM

## 2017-01-01 DIAGNOSIS — Z902 Acquired absence of lung [part of]: Secondary | ICD-10-CM

## 2017-01-01 DIAGNOSIS — C3412 Malignant neoplasm of upper lobe, left bronchus or lung: Secondary | ICD-10-CM | POA: Diagnosis not present

## 2017-01-01 NOTE — Progress Notes (Signed)
[address]       [place] [address]             [phone]    HPI: Mrs. Lamarca returns for a scheduled 1 year follow-up visit  Mrs. Brissett is a 76 year old woman with a past history notable for a left upper lobectomy for stage IA non-small cell carcinoma, carotid artery disease with bilateral carotid endarterectomies, remote tobacco abuse (quit 2015), hypertension, hyperlipidemia, peripheral vascular disease, subclavian steal, diabetes and COPD.   I did a thoracoscopic left upper lobectomy in October 2017. Her lung nodule turned out to be a stage IA squamous cell carcinoma. She had a prolonged air leak postoperatively and was not discharged until day 13.  I saw her in January 2018. She was doing well from a surgical standpoint but was complaining of dizziness. That had been her original complaint prior to her lung nodule being found.  She saw Dr. Julien Nordmann in May and was doing well with no evidence recurrent disease.  She still continues to have problems with dizziness, her most recent episode was in July. She denies any incisional pain related to her VATS. She is not having any breathing difficulties currently. Her appetite is good. She denies any weight loss. She denies any unusual headaches or visual changes. Past Medical History:  Diagnosis Date  . Anxiety   . Carotid artery disease ([place])   . Claudication ([place])   . COPD (chronic obstructive pulmonary disease) ([place])   . Diabetes mellitus   . Dizzy   . GERD (gastroesophageal reflux disease)   . Headache   . History of kidney stones   . Hypercholesteremia   . Hypertension   . 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 Shortness of breath    with exertion  . Stomach ulcer   . Tobacco abuse      Current Outpatient Prescriptions  Medication Sig Dispense Refill  . aspirin EC 81 MG tablet Take 81 mg by mouth daily.    [name] BIOTIN 5000 PO Take by mouth.    .  calcium carbonate (CALCIUM 600) 600 MG TABS tablet Take by mouth.    . diazepam (VALIUM) 5 MG tablet Take 5 mg by mouth 2 (two) times daily. Anxiety    . diltiazem (DILACOR XR) 180 MG 24 hr capsule Take 180 mg by mouth.    . fish oil-omega-3 fatty acids 1000 MG capsule Take 1 g by mouth daily.     [name] HYDROcodone-acetaminophen (NORCO/VICODIN) 5-325 MG tablet Take 1 tablet by mouth every 6 (six) hours as needed for moderate pain.    . Magnesium Gluconate 550 MG TABS Take 250 mg by mouth.     . metFORMIN (GLUCOPHAGE) 500 MG tablet Take 500 mg by mouth daily with breakfast.     . metoprolol tartrate (LOPRESSOR) 25 MG tablet Take 0.5 tablets (12.5 mg total) by mouth 2 (two) times daily. 30 tablet 1  . naphazoline-glycerin (CLEAR EYES) 0.012-0.2 % SOLN Place 1-2 drops into both eyes 4 (four) times daily as needed (for itchy eyes).    . NONFORMULARY OR COMPOUNDED ITEM [facility]:  Combo Pain #1 - Baclofen 3%, Gabapentin 8%, Aripiprazole 2.4%, Ketorolac 8%, Licocaine 5%, dispense 240grams, apply 1-2 pumps to focal pain area 3-4 times daily, rub in 1-2 minutes, +3Refills (Patient taking differently: Apply 1 application topically 3 (three) times daily as needed (for foot pain.). [facility]:  Combo Pain #1 - Baclofen 3%, Gabapentin 8%, Aripiprazole 9.2%, Ketorolac 8%, Licocaine 5%, dispense 240grams, apply 1-2 pumps to focal pain area 3-4 times daily, rub in 1-2 minutes, +3Refills) 240 each 3  . ONE TOUCH ULTRA TEST test strip     . pantoprazole (PROTONIX) 40 MG tablet Take 40 mg by mouth 2 (two) times daily.     . pravastatin (PRAVACHOL) 40 MG tablet Take 40 mg by mouth daily.    [name] STIOLTO RESPIMAT 2.5-2.5 MCG/ACT AERS as needed.      No current facility-administered medications for this visit.     Physical Exam BP (!) 146/81 (BP Location: Right Arm, Patient Position: Sitting, Cuff Size: Large)   Pulse 88   Resp 18   Ht _0  (1.626 m)   Wt 187 lb (84.8 kg)   SpO2 94%  Comment: ON RA  BMI 32.40 kg/m  76 year old woman in no acute distress Alert and oriented 3 with no focal deficits No cervical or supraclavicular adenopathy Lungs clear with breath sounds bilaterally Cardiac regular rate and rhythm normal S1 and S2 Incisions well healed  Diagnostic Tests: CHEST  2 VIEW  COMPARISON:  Chest radiograph May 01, 2016 and chest CT Sep 12, 2016  FINDINGS: Postoperative change noted on the left. No edema or consolidation. Heart size and pulmonary vascularity are normal. No adenopathy. There is aortic atherosclerosis. There is a left subclavian artery stent proximally. No adenopathy. No bone lesions.  IMPRESSION: Postoperative change on the left. No edema or consolidation. Heart size within normal limits. There is aortic atherosclerosis.  Aortic Atherosclerosis (ICD10-I70.0).   Electronically Signed   By: Lowella Grip III M.D.   On: 01/01/2017 10:26 I reviewed the chest x-ray and concur with the findings noted above  Impression: Mrs. Bour is a 76 year old woman who is now almost a year out from a thoracoscopic left upper lobectomy for stage IA non-small cell carcinoma. She has no evidence of recurrent disease. She has no problems are related to her surgery at the present time.  Stage IA non-small cell carcinoma- she will continue to be followed by Dr. Julien Nordmann. She sees him again in the next month or 2.  Tobacco abuse- quit in 2015. Smoking cessation instruction/counseling given:  commended patient for quitting and reviewed strategies for preventing relapses   Plan: Follow-up is scheduled with Dr. Julien Nordmann  I will be happy to see her back again in time the future if I can be of any further assistance with her care  [name], MD [facility] [phone]

## 2017-01-28 ENCOUNTER — Other Ambulatory Visit: Payer: Self-pay | Admitting: Internal Medicine

## 2017-01-28 DIAGNOSIS — Z23 Encounter for immunization: Secondary | ICD-10-CM | POA: Diagnosis not present

## 2017-01-29 ENCOUNTER — Telehealth: Payer: Self-pay | Admitting: Internal Medicine

## 2017-01-29 MED ORDER — STIOLTO RESPIMAT 2.5-2.5 MCG/ACT IN AERS: 1.0000 | INHALATION_SPRAY | Freq: Every day | RESPIRATORY_TRACT | 0 refills | Status: DC

## 2017-01-29 NOTE — Telephone Encounter (Signed)
Spoke with patient. She was checking on the status of her Stiolto refill. Advised patient that it was denied yesterday due to her needed an OV with MW. Patient stated she wasn't aware that she needed to follow up after last year.   Patient has been scheduled with MW for 02/05/17 at 12pm. 1 month refill of Stiolto has been called into [facility]. Patient verbalized understanding. Nothing else needed at time of call.

## 2017-02-04 ENCOUNTER — Other Ambulatory Visit: Payer: Self-pay | Admitting: *Deleted

## 2017-02-04 DIAGNOSIS — I739 Peripheral vascular disease, unspecified: Secondary | ICD-10-CM

## 2017-02-05 ENCOUNTER — Encounter: Payer: Self-pay | Admitting: Internal Medicine

## 2017-02-05 ENCOUNTER — Ambulatory Visit (INDEPENDENT_AMBULATORY_CARE_PROVIDER_SITE_OTHER): Payer: PPO | Admitting: Internal Medicine

## 2017-02-05 VITALS — BP 120/72 | HR 74 | Ht 64.0 in | Wt 189.0 lb

## 2017-02-05 DIAGNOSIS — J449 Chronic obstructive pulmonary disease, unspecified: Secondary | ICD-10-CM | POA: Diagnosis not present

## 2017-02-05 NOTE — Patient Instructions (Addendum)
Ok to leave off the stiolto and only use it if you are losing ground on your breathing or having flares of cough / bronchitis   Pulmonary follow up is as needed

## 2017-02-05 NOTE — Progress Notes (Signed)
Subjective:    Patient ID: Caitlin Oliver, female    DOB: 12/14/1940,  MRN: [id number]    Brief patient profile:  41 yowf quit smoking Feb 2015 with freq espisodes of bronchitis while smoking resolved p quit with dx of copd  and referred to pulmonary clinic 12/08/2015 by Dr Bing Matter for SPN incidentally found on w/u for carotid dz after ? TIA with criteria for GOLD II COPD on initial pre-rx PFTs   History of Present Illness  12/08/2015 1st [facility] office visit/ [facility]   Chief Complaint  Patient presents with  . Pulmonary Consult    Referred by Clyde Lundborg, PA for eval of pulmonary nodule. Pt states she was dxed with COPD years ago, and her breathing is currently at baseline for her.    doe x years x mopping but does fine flat surface walking = MMRC1 = can walk nl pace, flat grade, can't hurry or go uphills or steps s sob   rec Stiolto 2 pffs each am  Work on inhaler technique:     PET 12/15/2015 >>>  1.5 cm hypermetabolic left upper lobe pulmonary nodule, consistent with primary bronchogenic carcinoma. No evidence of hypermetabolic thoracic lymph nodes. No evidence of abdominal or pelvic metastatic disease. Two hypermetabolic left cervical level 1B submandibular lymph nodes, largest measuring 11 mm. Differential diagnosis includes reactive lymphadenopathy and metastatic disease.>  U/S bx  12/23/15 neg  - LULobectomy 02/03/2016 :  [facility] clinically IA> no aduvant rx       02/05/2017  f/u ov/[facility] re:  GOLD II copd  Chief Complaint  Patient presents with  . Follow-up    Breathing is overall doing well. No new co's.   can't really tell any difference on / off stiolto in terms of doe/ cough or need for saba/ minimal daytime dry cough  Doe= MMRC1 = can walk nl pace, flat grade, can't hurry or go uphills or steps s sob    No obvious day to day or daytime variability or assoc excess/ purulent sputum or mucus plugs or hemoptysis or cp or chest tightness, subjective  wheeze or overt sinus or hb symptoms. No unusual exp hx or h/o childhood pna/ asthma or knowledge of premature birth.  Sleeping ok flat without nocturnal  or early am exacerbation  of respiratory  c/o's or need for noct saba. Also denies any obvious fluctuation of symptoms with weather or environmental changes or other aggravating or alleviating factors except as outlined above   Current Allergies, Complete Past Medical History, Past Surgical History, Family History, and Social History were reviewed in [employer] record.  ROS  The following are not active complaints unless bolded Hoarseness, sore throat, dysphagia, dental problems, itching, sneezing,  nasal congestion or discharge of excess mucus or purulent secretions, ear ache,   fever, chills, sweats, unintended wt loss or wt gain, classically pleuritic or exertional cp,  orthopnea pnd or leg swelling, presyncope, palpitations, abdominal pain, anorexia, nausea, vomiting, diarrhea  or change in bowel habits or change in bladder habits, change in stools or change in urine, dysuria, hematuria,  rash, arthralgias, visual complaints, headache, numbness, weakness or ataxia or problems with walking or coordination,  change in mood/affect or memory.        Current Meds  Medication Sig  . aspirin EC 81 MG tablet Take 81 mg by mouth daily.  [name] BIOTIN 5000 PO Take by mouth.  . calcium carbonate (CALCIUM 600) 600 MG TABS tablet Take by  mouth.  . diazepam (VALIUM) 5 MG tablet Take 5 mg by mouth 2 (two) times daily. Anxiety  . diltiazem (DILACOR XR) 180 MG 24 hr capsule Take 180 mg by mouth.  . fish oil-omega-3 fatty acids 1000 MG capsule Take 1 g by mouth daily.   [name] HYDROcodone-acetaminophen (NORCO/VICODIN) 5-325 MG tablet Take 1 tablet by mouth every 6 (six) hours as needed for moderate pain.  . Magnesium Gluconate 550 MG TABS Take 250 mg by mouth.   . metFORMIN (GLUCOPHAGE) 500 MG tablet Take 500 mg by mouth daily with breakfast.     . metoprolol tartrate (LOPRESSOR) 25 MG tablet Take 0.5 tablets (12.5 mg total) by mouth 2 (two) times daily.  . naphazoline-glycerin (CLEAR EYES) 0.012-0.2 % SOLN Place 1-2 drops into both eyes 4 (four) times daily as needed (for itchy eyes).  . NONFORMULARY OR COMPOUNDED ITEM [facility]:  Combo Pain #1 - Baclofen 3%, Gabapentin 8%, Aripiprazole 9.7%, Ketorolac 8%, Licocaine 5%, dispense 240grams, apply 1-2 pumps to focal pain area 3-4 times daily, rub in 1-2 minutes, +3Refills (Patient taking differently: Apply 1 application topically 3 (three) times daily as needed (for foot pain.). [facility]:  Combo Pain #1 - Baclofen 3%, Gabapentin 8%, Aripiprazole 0.2%, Ketorolac 8%, Licocaine 5%, dispense 240grams, apply 1-2 pumps to focal pain area 3-4 times daily, rub in 1-2 minutes, +3Refills)  . ONE TOUCH ULTRA TEST test strip   . pantoprazole (PROTONIX) 40 MG tablet Take 40 mg by mouth 2 (two) times daily.   . pravastatin (PRAVACHOL) 40 MG tablet Take 40 mg by mouth daily.  [name] STIOLTO RESPIMAT 2.5-2.5 MCG/ACT AERS Take 1 puff by mouth daily. (Patient taking differently: Take 2 puffs by mouth daily. )                      Objective:   Physical Exam  amb wf nad  02/05/2017      189  12/08/15 183 lb 3.2 oz (83.1 kg)  11/23/15 181 lb 6.4 oz (82.3 kg)  11/22/15 182 lb 1.6 oz (82.6 kg)    Vital signs reviewed   - Note on arrival 02 sats  95% on RA    HEENT: nl dentition, turbinates, and oropharynx. Nl external ear canals without cough reflex   NECK :  without JVD/Nodes/TM/ nl carotid upstrokes bilaterally   LUNGS: no acc muscle use,  Nl contour chest which is clear to A and P bilaterally without cough on insp or exp maneuvers   CV:  RRR  no s3 or murmur or increase in P2, no edema   ABD:  soft and nontender with nl inspiratory excursion in the supine position. No bruits or organomegaly, bowel sounds nl  MS:  Nl gait/ ext warm without deformities, calf  tenderness, cyanosis or clubbing No obvious joint restrictions   SKIN: warm and dry without lesions    NEURO:  alert, approp, nl sensorium with  no motor deficits          I personally reviewed images and agree with radiology impression as follows:  CXR:   01/01/17 Postoperative change on the left. No edema or consolidation. Heart size within normal limits. There is aortic atherosclerosis.      Assessment & Plan:

## 2017-02-08 ENCOUNTER — Encounter: Payer: Self-pay | Admitting: Internal Medicine

## 2017-02-08 NOTE — Assessment & Plan Note (Addendum)
Quit smoking Feb 2015  - Spirometry 12/08/2015  FEV1 1.12 (53%)  Ratio 65   - 12/08/2015  After extensive coaching HFA effectiveness =  75%   > try stiolto 2 each am - PFT's 12/15/15  FEV1 1.92 (91 % ) ratio 72  p 8 % improvement from saba p stiolto prior to study with DLCO  69 % corrects to 80 % for alv volume    She is not convinced stiolto is helping     I reviewed the Fletcher curve with the patient that basically indicates  if you quit smoking when your best day FEV1 is still well preserved (as is clearly  the case here)  it is highly unlikely you will progress to severe disease and informed the patient there was  no medication on the market that has proven to alter the curve/ its downward trajectory  or the likelihood of progression of their disease(unlike other chronic medical conditions such as atheroclerosis where we do think we can change the natural hx with risk reducing meds)    Therefore   maintaining abstinence from cigs is the most important aspect of care, not choice of inhalers or for that matter, doctors.  rx is directed at improving symptoms and reducing risk of exacerbations, not on changing the natural hx of the dz  Since now mmrc =1 can try off stiolto maint rx and add back if worse doe or flares    I had an extended discussion with the patient reviewing all relevant studies completed to date and  lasting 15 to 20 minutes of a 25 minute visit    Each maintenance medication was reviewed in detail including most importantly the difference between maintenance and prns and under what circumstances the prns are to be triggered using an action plan format that is not reflected in the computer generated alphabetically organized AVS.    Please see AVS for specific instructions unique to this visit that I personally wrote and verbalized to the the pt in detail and then reviewed with pt  by my nurse highlighting any  changes in therapy recommended at today's visit to their plan of care.

## 2017-03-12 ENCOUNTER — Ambulatory Visit: Payer: PPO | Admitting: Internal Medicine

## 2017-03-12 ENCOUNTER — Telehealth: Payer: Self-pay | Admitting: Internal Medicine

## 2017-03-12 ENCOUNTER — Ambulatory Visit (HOSPITAL_COMMUNITY)
Admission: RE | Admit: 2017-03-12 | Discharge: 2017-03-12 | Disposition: A | Discharge status: Home / Self Care | Payer: PPO | Source: Ambulatory Visit | Attending: Internal Medicine | Admitting: Internal Medicine

## 2017-03-12 ENCOUNTER — Other Ambulatory Visit (HOSPITAL_BASED_OUTPATIENT_CLINIC_OR_DEPARTMENT_OTHER): Payer: PPO

## 2017-03-12 ENCOUNTER — Encounter: Payer: Self-pay | Admitting: Internal Medicine

## 2017-03-12 ENCOUNTER — Encounter (HOSPITAL_COMMUNITY): Payer: Self-pay

## 2017-03-12 DIAGNOSIS — Z85118 Personal history of other malignant neoplasm of bronchus and lung: Secondary | ICD-10-CM

## 2017-03-12 DIAGNOSIS — C3412 Malignant neoplasm of upper lobe, left bronchus or lung: Secondary | ICD-10-CM | POA: Diagnosis not present

## 2017-03-12 DIAGNOSIS — C3490 Malignant neoplasm of unspecified part of unspecified bronchus or lung: Secondary | ICD-10-CM

## 2017-03-12 DIAGNOSIS — Z9889 Other specified postprocedural states: Secondary | ICD-10-CM | POA: Diagnosis not present

## 2017-03-12 DIAGNOSIS — I999 Unspecified disorder of circulatory system: Secondary | ICD-10-CM | POA: Diagnosis not present

## 2017-03-12 DIAGNOSIS — C3432 Malignant neoplasm of lower lobe, left bronchus or lung: Secondary | ICD-10-CM

## 2017-03-12 DIAGNOSIS — J439 Emphysema, unspecified: Secondary | ICD-10-CM | POA: Insufficient documentation

## 2017-03-12 DIAGNOSIS — I10 Essential (primary) hypertension: Secondary | ICD-10-CM

## 2017-03-12 LAB — CBC WITH DIFFERENTIAL/PLATELET
BASO%: 0.9 % (ref 0.0–2.0)
BASOS ABS: 0.1 10*3/uL (ref 0.0–0.1)
EOS%: 1.6 % (ref 0.0–7.0)
Eosinophils Absolute: 0.1 10*3/uL (ref 0.0–0.5)
HCT: 48 % — ABNORMAL HIGH (ref 34.8–46.6)
HEMOGLOBIN: 16 g/dL — AB (ref 11.6–15.9)
LYMPH%: 19.7 % (ref 14.0–49.7)
MCH: 29.5 pg (ref 25.1–34.0)
MCHC: 33.4 g/dL (ref 31.5–36.0)
MCV: 88.4 fL (ref 79.5–101.0)
MONO#: 0.6 10*3/uL (ref 0.1–0.9)
MONO%: 7.3 % (ref 0.0–14.0)
NEUT#: 6 10*3/uL (ref 1.5–6.5)
NEUT%: 70.5 % (ref 38.4–76.8)
Platelets: 259 10*3/uL (ref 145–400)
RBC: 5.43 10*6/uL (ref 3.70–5.45)
RDW: 12.7 % (ref 11.2–14.5)
WBC: 8.5 10*3/uL (ref 3.9–10.3)
lymph#: 1.7 10*3/uL (ref 0.9–3.3)

## 2017-03-12 LAB — COMPREHENSIVE METABOLIC PANEL
ALT: 38 U/L (ref 0–55)
ANION GAP: 11 meq/L (ref 3–11)
AST: 38 U/L — ABNORMAL HIGH (ref 5–34)
Albumin: 4.2 g/dL (ref 3.5–5.0)
Alkaline Phosphatase: 91 U/L (ref 40–150)
BILIRUBIN TOTAL: 0.44 mg/dL (ref 0.20–1.20)
BUN: 19.6 mg/dL (ref 7.0–26.0)
CALCIUM: 9.6 mg/dL (ref 8.4–10.4)
CO2: 27 meq/L (ref 22–29)
CREATININE: 0.9 mg/dL (ref 0.6–1.1)
Chloride: 100 mEq/L (ref 98–109)
EGFR: >60 mL/min/{1.73_m2} (ref >60)
Glucose: 148 mg/dl — ABNORMAL HIGH (ref 70–140)
Potassium: 4.6 mEq/L (ref 3.5–5.1)
SODIUM: 138 meq/L (ref 136–145)
Total Protein: 7.7 g/dL (ref 6.4–8.3)

## 2017-03-12 MED ORDER — IOPAMIDOL (ISOVUE-300) INJECTION 61%
75.0000 mL | Freq: Once | INTRAVENOUS | Status: AC | PRN
  Administered 2017-03-12: 75 mL via INTRAVENOUS

## 2017-03-12 NOTE — Telephone Encounter (Signed)
Scheduled appt per 11/27 los - patient prefers all appt on the same day for next appts. Gave patient AVS and calender per los. Central radiology to contact patient with with ct schedule.

## 2017-03-12 NOTE — Progress Notes (Signed)
[facility] Telephone:([phone]) [phone]   Fax:([phone]) [phone]  OFFICE PROGRESS NOTE  [name], PA-C [address] [place] [address]  DIAGNOSIS: Stage IA (T1a, N0, M0) non-small cell lung cancer, squamous cell carcinoma status post left lower lobectomy with lymph node sampling in October 2017.  PRIOR THERAPY: Status post left lower lobectomy with lymph node sampling in October 2017.  CURRENT THERAPY: Observation.  INTERVAL HISTORY: Caitlin Oliver 76 y.o. female returns to the clinic today for 6 months follow-up visit.  The patient is feeling fine today with no specific complaints.  She denied having any chest pain, shortness of breath, cough or hemoptysis.  She denied having any weight loss or night sweats.  She has no nausea, vomiting, diarrhea or constipation.  Her blood pressure has been high over the last few months.  She was seen by her primary care physician but no change in her medication.  She had a repeat CT scan of the chest performed earlier today and she is here for evaluation and discussion of her scan results.   MEDICAL HISTORY: Past Medical History:  Diagnosis Date  . Anxiety   . Carotid artery disease ([place])   . Claudication ([place])   . COPD (chronic obstructive pulmonary disease) ([place])   . Diabetes mellitus   . Dizzy   . GERD (gastroesophageal reflux disease)   . Headache   . History of kidney stones   . Hypercholesteremia   . Hypertension   . 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 Shortness of breath    with exertion  . Stomach ulcer   . Tobacco abuse     ALLERGIES:  is allergic to no known allergies.  MEDICATIONS:  Current Outpatient Medications  Medication Sig Dispense Refill  . aspirin EC 81 MG tablet Take 81 mg by mouth daily.    [name] BIOTIN 5000 PO Take by mouth.    . calcium carbonate (CALCIUM 600) 600 MG TABS tablet Take by mouth.    . diazepam (VALIUM) 5 MG tablet Take  5 mg by mouth 2 (two) times daily. Anxiety    . diltiazem (DILACOR XR) 180 MG 24 hr capsule Take 180 mg by mouth.    . fish oil-omega-3 fatty acids 1000 MG capsule Take 1 g by mouth daily.     [name] HYDROcodone-acetaminophen (NORCO/VICODIN) 5-325 MG tablet Take 1 tablet by mouth every 6 (six) hours as needed for moderate pain.    . Magnesium Gluconate 550 MG TABS Take 250 mg by mouth.     . metFORMIN (GLUCOPHAGE) 500 MG tablet Take 500 mg by mouth daily with breakfast.     . metoprolol tartrate (LOPRESSOR) 25 MG tablet Take 0.5 tablets (12.5 mg total) by mouth 2 (two) times daily. 30 tablet 1  . naphazoline-glycerin (CLEAR EYES) 0.012-0.2 % SOLN Place 1-2 drops into both eyes 4 (four) times daily as needed (for itchy eyes).    . NONFORMULARY OR COMPOUNDED ITEM [facility]:  Combo Pain #1 - Baclofen 3%, Gabapentin 8%, Aripiprazole 0.9%, Ketorolac 8%, Licocaine 5%, dispense 240grams, apply 1-2 pumps to focal pain area 3-4 times daily, rub in 1-2 minutes, +3Refills (Patient taking differently: Apply 1 application topically 3 (three) times daily as needed (for foot pain.). [facility]:  Combo Pain #1 - Baclofen 3%, Gabapentin 8%, Aripiprazole 2.3%, Ketorolac 8%, Licocaine 5%, dispense 240grams, apply 1-2 pumps to focal pain area 3-4  times daily, rub in 1-2 minutes, +3Refills) 240 each 3  . ONE TOUCH ULTRA TEST test strip     . pantoprazole (PROTONIX) 40 MG tablet Take 40 mg by mouth 2 (two) times daily.     . pravastatin (PRAVACHOL) 40 MG tablet Take 40 mg by mouth daily.    [name] STIOLTO RESPIMAT 2.5-2.5 MCG/ACT AERS Take 1 puff by mouth daily. (Patient taking differently: Take 2 puffs by mouth daily. ) 1 Inhaler 0   No current facility-administered medications for this visit.     SURGICAL HISTORY:  Past Surgical History:  Procedure Laterality Date  . ABDOMINAL HYSTERECTOMY    . APPENDECTOMY    . BREAST REDUCTION SURGERY    . CAROTID ANGIOGRAM N/A 02/25/2013   Procedure:  CAROTID ANGIOGRAM;  Surgeon: Lorretta Harp, MD;  Location: [facility] CATH LAB;  Service: Cardiovascular;  Laterality: N/A;  . ENDARTERECTOMY Right 03/06/2013   Procedure: ENDARTERECTOMY CAROTID-RIGHT;  Surgeon: Serafina Mitchell, MD;  Location: [facility];  Service: Vascular;  Laterality: Right;  . ENDARTERECTOMY Left 05/07/2013   Procedure: LEFT CAROTID ARTERY ENDARTERECTOMY WITH VASCU-GUARD PATCH ANGIOPLASTY ;  Surgeon: Serafina Mitchell, MD;  Location: [facility];  Service: Vascular;  Laterality: Left;  . LOBECTOMY Left 02/03/2016   Procedure: LEFT UPPER LOBECTOMY;  Surgeon: [name], MD;  Location: [facility];  Service: Thoracic;  Laterality: Left;  . LOWER EXTREMITY ANGIOGRAM N/A 02/25/2013   Procedure: LOWER EXTREMITY ANGIOGRAM;  Surgeon: Lorretta Harp, MD;  [facility] C[facility] LAB;  Service: Cardiovascular;  Laterality: N/A;  . LOWER EXTREMITY ANGIOGRAM N/A 07/27/2013   Procedure: LOWER EXTREMITY ANGIOGRAM;  Surgeon: Lorretta Harp, MD;  Location: [facility] CATH LAB;  Service: Cardiovascular;  Laterality: N/A;  . PATCH ANGIOPLASTY Right 03/06/2013   Procedure: PATCH ANGIOPLASTY of Right Carotid Artery using Vascu-Guard Patch;  Surgeon: Serafina Mitchell, MD;  Location: [facility];  Service: Vascular;  Laterality: Right;  . PERIPHERAL VASCULAR CATHETERIZATION N/A 11/15/2015   Procedure: Aortic Arch Angiography;  Surgeon: Serafina Mitchell, MD;  Location: [facility] CV LAB;  Service: Cardiovascular;  Laterality: N/A;  . PERIPHERAL VASCULAR CATHETERIZATION Bilateral 11/15/2015   Procedure: Carotid Angiography;  Surgeon: Serafina Mitchell, MD;  Location: [facility] CV LAB;  Service: Cardiovascular;  Laterality: Bilateral;  . PERIPHERAL VASCULAR CATHETERIZATION Left 11/15/2015   Procedure: Upper Extremity Angiography;  Surgeon: Serafina Mitchell, MD;  Location: [facility] CV LAB;  Service: Cardiovascular;  Laterality: Left;  . PERIPHERAL VASCULAR CATHETERIZATION Left 11/15/2015   Procedure: Peripheral Vascular Intervention;   Surgeon: Serafina Mitchell, MD;  Location: [facility] CV LAB;  Service: Cardiovascular;  Laterality: Left;  subclavian   . SALIVARY GLAND SURGERY     scar tissue removed from left saliva glad  . TUBAL LIGATION    . VIDEO ASSISTED THORACOSCOPY (VATS)/WEDGE RESECTION Left 02/03/2016   Procedure: VIDEO ASSISTED THORACOSCOPY;  Surgeon: [name], MD;  Location: [facility];  Service: Thoracic;  Laterality: Left;    REVIEW OF SYSTEMS:  A comprehensive review of systems was negative.   PHYSICAL EXAMINATION: General appearance: alert, cooperative and no distress Head: Normocephalic, without obvious abnormality, atraumatic Neck: no adenopathy, no JVD, supple, symmetrical, trachea midline and thyroid not enlarged, symmetric, no tenderness/mass/nodules Lymph nodes: Cervical, supraclavicular, and axillary nodes normal. Resp: clear to auscultation bilaterally Back: symmetric, no curvature. ROM normal. No CVA tenderness. Cardio: regular rate and rhythm, S1, S2 normal, no murmur, click, rub or gallop GI: soft, non-tender; bowel sounds normal;  no masses,  no organomegaly Extremities: extremities normal, atraumatic, no cyanosis or edema  ECOG PERFORMANCE STATUS: 1 - Symptomatic but completely ambulatory  Blood pressure (!) 118/59, pulse 78, temperature 97.8 F (36.6 C), temperature source Oral, resp. rate 19, height _0  (1.626 m), weight 191 lb (86.6 kg), SpO2 100 %.  LABORATORY DATA: Lab Results  Component Value Date   WBC 8.5 03/12/2017   HGB 16.0 (H) 03/12/2017   HCT 48.0 (H) 03/12/2017   MCV 88.4 03/12/2017   PLT 259 03/12/2017      Chemistry      Component Value Date/Time   NA 138 03/12/2017 1121   K 4.6 03/12/2017 1121   CL 99 (L) 02/07/2016 0431   CO2 27 03/12/2017 1121   BUN 19.6 03/12/2017 1121   CREATININE 0.9 03/12/2017 1121      Component Value Date/Time   CALCIUM 9.6 03/12/2017 1121   ALKPHOS 91 03/12/2017 1121   AST 38 (H) 03/12/2017 1121   ALT 38 03/12/2017 1121     BILITOT 0.44 03/12/2017 1121       RADIOGRAPHIC STUDIES: No results found.  ASSESSMENT AND PLAN: This is a very pleasant 76 years old white female with a stage IA non-small cell lung cancer, squamous cell carcinoma status post left lower lobectomy with lymph node dissection. The patient is doing fine today with no specific complaints. She had a repeat CT scan of the chest performed earlier today but the final report is still pending I personally and independently reviewed the scan images and I did not see any clear evidence for recurrence but I would wait for the final report. I recommended for the patient to continue on observation with repeat CT scan of the chest in 6 months. She was advised to call immediately if she has any concerning symptoms in the interval. The patient voices understanding of current disease status and treatment options and is in agreement with the current care plan. All questions were answered. The patient knows to call the clinic with any problems, questions or concerns. We can certainly see the patient much sooner if necessary.  Disclaimer: This note was dictated with voice recognition software. Similar sounding words can inadvertently be transcribed and may not be corrected upon review.

## 2017-03-13 DIAGNOSIS — Z135 Encounter for screening for eye and ear disorders: Secondary | ICD-10-CM | POA: Diagnosis not present

## 2017-03-13 DIAGNOSIS — H5203 Hypermetropia, bilateral: Secondary | ICD-10-CM | POA: Diagnosis not present

## 2017-03-13 DIAGNOSIS — H2510 Age-related nuclear cataract, unspecified eye: Secondary | ICD-10-CM | POA: Diagnosis not present

## 2017-03-13 DIAGNOSIS — E119 Type 2 diabetes mellitus without complications: Secondary | ICD-10-CM | POA: Diagnosis not present

## 2017-03-18 DIAGNOSIS — G252 Other specified forms of tremor: Secondary | ICD-10-CM | POA: Diagnosis not present

## 2017-03-18 DIAGNOSIS — R251 Tremor, unspecified: Secondary | ICD-10-CM | POA: Diagnosis not present

## 2017-03-18 DIAGNOSIS — I10 Essential (primary) hypertension: Secondary | ICD-10-CM | POA: Diagnosis not present

## 2017-03-18 DIAGNOSIS — E119 Type 2 diabetes mellitus without complications: Secondary | ICD-10-CM | POA: Diagnosis not present

## 2017-04-01 ENCOUNTER — Other Ambulatory Visit: Payer: Self-pay | Admitting: Pharmacy Technician

## 2017-04-01 NOTE — Patient Outreach (Signed)
[facility]) Care Management  04/01/2017  Caitlin Oliver Nov 12, 1940 [id number]  Incoming [employer] EMMI call in reference to medication adherence. HIPAA identifiers verified and verbal consent received. Patient states she takes Pravastatin and Metformin daily as prescribed. She gets all of her medications through mail order and does not have any barriers that would affect her adherence.  Doreene Burke, [place] [phone]

## 2017-04-04 DIAGNOSIS — E119 Type 2 diabetes mellitus without complications: Secondary | ICD-10-CM | POA: Diagnosis not present

## 2017-04-04 DIAGNOSIS — K219 Gastro-esophageal reflux disease without esophagitis: Secondary | ICD-10-CM | POA: Diagnosis not present

## 2017-04-04 DIAGNOSIS — R251 Tremor, unspecified: Secondary | ICD-10-CM | POA: Diagnosis not present

## 2017-04-04 DIAGNOSIS — I10 Essential (primary) hypertension: Secondary | ICD-10-CM | POA: Diagnosis not present

## 2017-04-11 ENCOUNTER — Telehealth: Payer: Self-pay | Admitting: Neurology

## 2017-04-11 NOTE — Telephone Encounter (Signed)
I called pt. She reports having a migraine since 04/09/17. She reports that Dr. Erlinda Hong performed a nerve block on her in March, and since then, she sees Dr. Maryjean Ka at [facility] for 2 other nerve blocks and pain management. Pt is also taking hydrocodone for pain. I recommended to pt that since she is seeing Dr. Maryjean Ka for pain management and nerve blocks, she should contact that office. I did advise her that Dr. Erlinda Hong is out of the office and I will send this message to the [facility] to find out if there is anything else the [facility] can offer her. If so, I will call the pt back. Pt is agreeable to this and will call Dr. Maryjean Ka office.

## 2017-04-11 NOTE — Telephone Encounter (Signed)
Pt said Dr Brayton Mars in April "deaded the nerves" to stop her migrans. Pt said the migraines started having them again on Dec 25th and it hasn't went away. The only way to get relief is to so lay down in a dark room.

## 2017-04-11 NOTE — Telephone Encounter (Signed)
I agree she should call her pain doctor, I don;t have anything to offer her today as far as an appointment or further management but we will let Dr. Erlinda Hong know for when he is back in the office thanks

## 2017-04-14 NOTE — Telephone Encounter (Signed)
Called pt today to get more information from her. She seemed picked up the phone and before I finish my sentences, she hang off the phone. I called her back but she did not pick up the phone. I left VM for her to call back.   Rosalin Hawking, MD PhD Stroke Neurology 04/14/2017 4:57 PM

## 2017-04-18 DIAGNOSIS — M47812 Spondylosis without myelopathy or radiculopathy, cervical region: Secondary | ICD-10-CM | POA: Diagnosis not present

## 2017-04-18 DIAGNOSIS — M5481 Occipital neuralgia: Secondary | ICD-10-CM | POA: Diagnosis not present

## 2017-04-25 DIAGNOSIS — M47812 Spondylosis without myelopathy or radiculopathy, cervical region: Secondary | ICD-10-CM | POA: Diagnosis not present

## 2017-04-25 DIAGNOSIS — M5481 Occipital neuralgia: Secondary | ICD-10-CM | POA: Diagnosis not present

## 2017-04-26 ENCOUNTER — Other Ambulatory Visit: Payer: Self-pay

## 2017-04-26 DIAGNOSIS — I6523 Occlusion and stenosis of bilateral carotid arteries: Secondary | ICD-10-CM

## 2017-04-26 DIAGNOSIS — I6529 Occlusion and stenosis of unspecified carotid artery: Secondary | ICD-10-CM

## 2017-05-07 ENCOUNTER — Other Ambulatory Visit: Payer: Self-pay | Admitting: Physician Assistant

## 2017-05-07 ENCOUNTER — Ambulatory Visit
Admission: RE | Admit: 2017-05-07 | Discharge: 2017-05-07 | Disposition: A | Discharge status: Home / Self Care | Payer: PPO | Source: Ambulatory Visit | Attending: Physician Assistant | Admitting: Physician Assistant

## 2017-05-07 DIAGNOSIS — R0602 Shortness of breath: Secondary | ICD-10-CM | POA: Diagnosis not present

## 2017-05-07 DIAGNOSIS — R062 Wheezing: Secondary | ICD-10-CM | POA: Diagnosis not present

## 2017-05-07 DIAGNOSIS — R339 Retention of urine, unspecified: Secondary | ICD-10-CM | POA: Diagnosis not present

## 2017-05-07 DIAGNOSIS — J441 Chronic obstructive pulmonary disease with (acute) exacerbation: Secondary | ICD-10-CM | POA: Diagnosis not present

## 2017-05-07 DIAGNOSIS — R0600 Dyspnea, unspecified: Secondary | ICD-10-CM | POA: Diagnosis not present

## 2017-05-21 DIAGNOSIS — E7800 Pure hypercholesterolemia, unspecified: Secondary | ICD-10-CM | POA: Diagnosis not present

## 2017-05-21 DIAGNOSIS — R29898 Other symptoms and signs involving the musculoskeletal system: Secondary | ICD-10-CM | POA: Diagnosis not present

## 2017-05-21 DIAGNOSIS — M79604 Pain in right leg: Secondary | ICD-10-CM | POA: Diagnosis not present

## 2017-05-21 DIAGNOSIS — M79602 Pain in left arm: Secondary | ICD-10-CM | POA: Diagnosis not present

## 2017-05-21 DIAGNOSIS — M79601 Pain in right arm: Secondary | ICD-10-CM | POA: Diagnosis not present

## 2017-05-21 DIAGNOSIS — M79605 Pain in left leg: Secondary | ICD-10-CM | POA: Diagnosis not present

## 2017-06-03 ENCOUNTER — Ambulatory Visit (INDEPENDENT_AMBULATORY_CARE_PROVIDER_SITE_OTHER): Payer: PPO | Admitting: Neurology

## 2017-06-03 ENCOUNTER — Encounter: Payer: Self-pay | Admitting: Neurology

## 2017-06-03 VITALS — BP 109/61 | HR 73 | Wt 181.0 lb

## 2017-06-03 DIAGNOSIS — I771 Stricture of artery: Secondary | ICD-10-CM

## 2017-06-03 DIAGNOSIS — M79605 Pain in left leg: Secondary | ICD-10-CM | POA: Diagnosis not present

## 2017-06-03 DIAGNOSIS — M79604 Pain in right leg: Secondary | ICD-10-CM

## 2017-06-03 DIAGNOSIS — M5481 Occipital neuralgia: Secondary | ICD-10-CM

## 2017-06-03 DIAGNOSIS — H8110 Benign paroxysmal vertigo, unspecified ear: Secondary | ICD-10-CM | POA: Diagnosis not present

## 2017-06-03 DIAGNOSIS — I6523 Occlusion and stenosis of bilateral carotid arteries: Secondary | ICD-10-CM

## 2017-06-03 DIAGNOSIS — M79601 Pain in right arm: Secondary | ICD-10-CM | POA: Insufficient documentation

## 2017-06-03 DIAGNOSIS — I739 Peripheral vascular disease, unspecified: Secondary | ICD-10-CM

## 2017-06-03 DIAGNOSIS — M79602 Pain in left arm: Secondary | ICD-10-CM

## 2017-06-03 NOTE — Patient Instructions (Addendum)
-  continue ASA and pravastatin for stroke prevention - check BP and glucose at home and record  - follow up with Dr. Trula Slade and Dr. Gwenlyn Found for carotid stenosis.  - Follow up with your primary care physician for stroke risk factor modification. Recommend maintain blood pressure goal 130-150/80, diabetes with hemoglobin A1c goal below 7.0% and lipids with LDL cholesterol goal below 70 mg/dL.  - follow up with Dr. Luan Pulling for pain management - will draw blood to evaluate why you have throbbing pain in all limbs. Continue use walker for safety - will do EMG/NCS to rule out muscle or nerve disorder.  - if above test negative, we may need to do cervical spine MRI or arm leg ultrasound.  - follow up in 4-6 weeks.

## 2017-06-03 NOTE — Progress Notes (Signed)
NEUROLOGY CLINIC FOLLOW UP NOTE  NAME: Caitlin Oliver DOB: 03/21/1941  HPI: Caitlin Oliver is a 77 y.o. female with PMH of HTN, HLD, PVD, and carotid stenosis s/p b/l CEAs who presents as a new patient for Episode of double vision and vertigo.   She has bilateral carotid stenosis s/p bilateral CEAs around 2014 about 6 weeks apart with Dr. Trula Slade. Followed with Dr. Gwenlyn Found and CUS on 09/01/14 showed left ICA 50-69% stenosis with left VA retrograde flow. She had recent CUS on 04/27/15 showed right ICA 1-39% stenosis but left ICA progressed to 60-79% stenosis with left VA retrograde flow.  Patient stated that on 10/12/2015 she was in the drugstore reading signs over her head, when she bent down her head she felt sudden onset double vision, and vertigo. Double vision was horizontal diplopia lasted about one hour and gradually resolved. Vertigo feels like head round and round, and she had to sit down due to imbalance, no nausea vomiting, no fall, lasted about one hour gradually resolved. After that, she continued to feel swimmy headed with headache, walking to the right side, had to hold on things on walking. Went to see PCP that day, blood pressure 140/70, recommended to go to ER, however patient refused, only agreed to do CTA head and neck. She had to use a walker for the next 2 days, and she still felt lightheaded for about 2 weeks. She had a CTA head and neck on 10/20/2015 which showed stenosis of right CCA and distal right ICA, as well as left ICA 76% stenosis. In addition, left subclavian artery significant narrowing and origin, proximal to left VA takeoff. Consistent with left VA regional grade flow seen on CUS and therefore subclavian steal syndrome.   Patient today in clinic continues to complain of lightheadedness, posterior head headache, light sensitivity. Check blood pressure 92/56. She stated that she took BP meds this morning. She also stated that her BP is also low at home.    She has hx of  HTN, HLD following with Dr. Gwenlyn Found, on pravastatin, diltiazem, HCTZ, metoprolol and lisinopirl. She also has PVD s/p diamondback orbital rotational atherectomy, PTA and stenting using a cast stents of her iliac arteries by Dr. Judithann Sauger 07/27/13 resulted in complete resolution of her claudication symptoms and normalization of her Dopplers.   She had GI bleeding in 2016 taking aspirin and the Plavix after stents placed in lower extremities. Had a GI consult, required 3 units of blood transfusion. GI workup showed bleeding ulcer in the stomach. She was told not to take any Plavix anymore. She is on aspirin 81 currently. Follow up with GI later was told ulcer was healed.  11/22/15 follow up - patient had followed up with Dr. Trula Slade and had cerebral angiogram as well as left subclavian artery stenting due to high-grade stenosis with subclavian steal syndrome. Patient tolerating with the procedure pretty well. BP check at home much better at 120-140s, today in clinic 100/69. However, patient continue to complain of neck pain, radiating headache to bilateral temporal and vertex, and lightheadedness. Found to have bilateral occipital neuralgia.   03/26/16 follow up - pt has been doing well from neuro standpoint. She had occipital nerve blocks in 11/2015 and 12/2015, and since then no more occipital neuralgia. She followed with [facility] Dr. Trula Slade in 12/2015 and left ICA 60-79% stenosis but VA all patent. She had left upper lobe lung resection for lung cancer treatment in 01/2016 without chemo and radiation. However, she continued to  have left chest pain post op and currently under treatment with pain management. She is only on cardizam and metoprolol now and BP stable. Today 131/78.  06/19/16 follow up - Pt came in again complaining of headache and dizziness. He followed with oncology for lung caner and planned to repeat CT chest in 08/2016 and PET scan some time. She also followed with Dr. Gwenlyn Found and PCP. Does not remember when HA  restarted but similar as before. She was also referred to ENT Dr.Shoemaker for vertigo but the vertigo she describes is more consistent with orthostasis. BP 129/71 today.   10/23/16 follow up - patient has been doing well. She had radiofrequency ablation at bilateral occipital nerves by Dr. Luan Pulling. Since the procedure patient felt headache much relieved, and she is very happy about that. She also had BPPV maneuver treatments just once and her vertigo is also gone. She still complains of mild frontal headache, but likely due to allergy and not bothering her. BP today 100/63, asymptomatic.  Interval history: During the interval time, patient has been doing well.  In 03/2017, she again started to have headache at the back of the head, consistent with recurrent occipital neuralgia.  She went to see Dr. Luan Pulling in pain management, again had radioablation bilaterally which relieved the pain.  Since then no headache so far.  In 04/2017, patient started to have code with coughing and sputum, no fever.  She still has mild symptoms of those right now.  However, patient stated that almost the same time she started to have throbbing, achy pain in both arms and legs, feels like Jell-O or water in all extremities.  This feedings lasting all day long, walking, or using arms makes symptoms slightly worse, sitting or resting makes symptoms slightly better.  However, symptoms start as soon as she wakes up from sleep. Her symptoms gradually getting worse over time.  She started using walker for walking due to feeling of knee buckling after walking.  She also complains of intermittent stiffness of both legs on sitting, she has to standing up from time to time to relieve the stiffness feeling.  During this encounter, she also had sharp radiating pain from right knee to the right foot, which has not happened before.  She stated that she had bilateral LE stent for PVD, however, this feeling is totally different from previous  vascular disease.  She denies any headache, nausea vomiting, dizziness, bowel or bladder dysfunction, diplopia, proptosis or fall.  She had seen her PCP for the throbbing pain and weakness feeling, and referred here for further evaluation.   Past Medical History:  Diagnosis Date  . Anxiety   . Carotid artery disease ([place])   . Claudication ([place])   . COPD (chronic obstructive pulmonary disease) ([place])   . Diabetes mellitus   . Dizzy   . GERD (gastroesophageal reflux disease)   . Headache   . History of kidney stones   . Hypercholesteremia   . Hypertension   . 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 Shortness of breath    with exertion  . Stomach ulcer   . Tobacco abuse    Past Surgical History:  Procedure Laterality Date  . ABDOMINAL HYSTERECTOMY    . APPENDECTOMY    . BREAST REDUCTION SURGERY    . CAROTID ANGIOGRAM N/A 02/25/2013   Procedure: CAROTID ANGIOGRAM;  Surgeon: Lorretta Harp, MD;  Location: [facility] CATH LAB;  Service: Cardiovascular;  Laterality:  N/A;  . ENDARTERECTOMY Right 03/06/2013   Procedure: ENDARTERECTOMY CAROTID-RIGHT;  Surgeon: Serafina Mitchell, MD;  Location: [place];  Service: Vascular;  Laterality: Right;  . ENDARTERECTOMY Left 05/07/2013   Procedure: LEFT CAROTID ARTERY ENDARTERECTOMY WITH VASCU-GUARD PATCH ANGIOPLASTY ;  Surgeon: Serafina Mitchell, MD;  Location: [place];  Service: Vascular;  Laterality: Left;  . LOBECTOMY Left 02/03/2016   Procedure: LEFT UPPER LOBECTOMY;  Surgeon: [name], MD;  Location: [place];  Service: Thoracic;  Laterality: Left;  . LOWER EXTREMITY ANGIOGRAM N/A 02/25/2013   Procedure: LOWER EXTREMITY ANGIOGRAM;  Surgeon: Lorretta Harp, MD;  Location[facility] CATH LAB;  Service: Cardiovascular;  Laterality: N/A;  . LOWER EXTREMITY ANGIOGRAM N/A 07/27/2013   Procedure: LOWER EXTREMITY ANGIOGRAM;  Surgeon: Lorretta Harp, MD;  Location: [facility] CATH LAB;  Service: Cardiovascular;  Laterality:  N/A;  . PATCH ANGIOPLASTY Right 03/06/2013   Procedure: PATCH ANGIOPLASTY of Right Carotid Artery using Vascu-Guard Patch;  Surgeon: Serafina Mitchell, MD;  Location: [place];  Service: Vascular;  Laterality: Right;  . PERIPHERAL VASCULAR CATHETERIZATION N/A 11/15/2015   Procedure: Aortic Arch Angiography;  Surgeon: Serafina Mitchell, MD;  Location: [place] CV LAB;  Service: Cardiovascular;  Laterality: N/A;  . PERIPHERAL VASCULAR CATHETERIZATION Bilateral 11/15/2015   Procedure: Carotid Angiography;  Surgeon: Serafina Mitchell, MD;  Location: [place] CV LAB;  Service: Cardiovascular;  Laterality: Bilateral;  . PERIPHERAL VASCULAR CATHETERIZATION Left 11/15/2015   Procedure: Upper Extremity Angiography;  Surgeon: Serafina Mitchell, MD;  Location: [place] CV LAB;  Service: Cardiovascular;  Laterality: Left;  . PERIPHERAL VASCULAR CATHETERIZATION Left 11/15/2015   Procedure: Peripheral Vascular Intervention;  Surgeon: Serafina Mitchell, MD;  Location: [place] CV LAB;  Service: Cardiovascular;  Laterality: Left;  subclavian   . SALIVARY GLAND SURGERY     scar tissue removed from left saliva glad  . TUBAL LIGATION    . VIDEO ASSISTED THORACOSCOPY (VATS)/WEDGE RESECTION Left 02/03/2016   Procedure: VIDEO ASSISTED THORACOSCOPY;  Surgeon: [name], MD;  Location: [place];  Service: Thoracic;  Laterality: Left;   Family History  Problem Relation Age of Onset  . Stroke Mother   . Hypertension Mother   . Kidney failure Father    Current Outpatient Medications  Medication Sig Dispense Refill  . aspirin EC 81 MG tablet Take 81 mg by mouth daily.    [name] BIOTIN 5000 PO Take by mouth.    . calcium carbonate (CALCIUM 600) 600 MG TABS tablet Take by mouth.    . diazepam (VALIUM) 5 MG tablet Take 5 mg by mouth 2 (two) times daily. Anxiety    . diltiazem (DILACOR XR) 180 MG 24 hr capsule Take 180 mg by mouth.    . fish oil-omega-3 fatty acids 1000 MG capsule Take 1 g by mouth daily.     [name]  HYDROcodone-acetaminophen (NORCO/VICODIN) 5-325 MG tablet Take 1 tablet by mouth every 6 (six) hours as needed for moderate pain.    . Magnesium Gluconate 550 MG TABS Take 250 mg by mouth.     . metFORMIN (GLUCOPHAGE) 500 MG tablet Take 500 mg by mouth 2 (two) times daily with a meal.     . metoprolol tartrate (LOPRESSOR) 25 MG tablet Take 0.5 tablets (12.5 mg total) by mouth 2 (two) times daily. (Patient taking differently: Take 12.5 mg by mouth 2 (two) times daily. ) 30 tablet 1  . naphazoline-glycerin (CLEAR EYES) 0.012-0.2 % SOLN Place 1-2 drops into  both eyes 4 (four) times daily as needed (for itchy eyes).    . NONFORMULARY OR COMPOUNDED ITEM [facility]:  Combo Pain #1 - Baclofen 3%, Gabapentin 8%, Aripiprazole 3.7%, Ketorolac 8%, Licocaine 5%, dispense 240grams, apply 1-2 pumps to focal pain area 3-4 times daily, rub in 1-2 minutes, +3Refills (Patient taking differently: Apply 1 application topically 3 (three) times daily as needed (for foot pain.). [facility]:  Combo Pain #1 - Baclofen 3%, Gabapentin 8%, Aripiprazole 1.6%, Ketorolac 8%, Licocaine 5%, dispense 240grams, apply 1-2 pumps to focal pain area 3-4 times daily, rub in 1-2 minutes, +3Refills) 240 each 3  . ONE TOUCH ULTRA TEST test strip     . pantoprazole (PROTONIX) 40 MG tablet Take 40 mg by mouth 2 (two) times daily.     . pravastatin (PRAVACHOL) 40 MG tablet Take 40 mg by mouth daily.    [name] STIOLTO RESPIMAT 2.5-2.5 MCG/ACT AERS Take 1 puff by mouth daily. (Patient taking differently: Take 2 puffs by mouth daily. ) 1 Inhaler 0   No current facility-administered medications for this visit.    Allergies  Allergen Reactions  . No Known Allergies Other (See Comments)   Social History   Socioeconomic History  . Marital status: Widowed    Spouse name: Not on file  . Number of children: Not on file  . Years of education: Not on file  . Highest education level: Not on file  Social Needs  .  Financial resource strain: Not on file  . Food insecurity - worry: Not on file  . Food insecurity - inability: Not on file  . Transportation needs - medical: Not on file  . Transportation needs - non-medical: Not on file  Occupational History  . Not on file  Tobacco Use  . Smoking status: Former Smoker    Packs/day: 1.50    Years: 56.00    Pack years: 84.00    Types: Cigarettes    Last attempt to quit: 07/07/2013    Years since quitting: 3.9  . Smokeless tobacco: Former Systems developer    Quit date: 02/25/2013  Substance and Sexual Activity  . Alcohol use: No  . Drug use: No  . Sexual activity: No  Other Topics Concern  . Not on file  Social History Narrative  . Not on file    Review of Systems Full 14 system review of systems performed and notable only for those listed, all others are neg:  Constitutional:   Cardiovascular:  Ear/Nose/Throat:   Skin:  Eyes:   Respiratory:  wheezing Gastroitestinal:   Genitourinary:  Hematology/Lymphatic:   Endocrine:  Musculoskeletal:   Allergy/Immunology:   Neurological:  Headache, dizziness Psychiatric:  Sleep:    Physical Exam  Vitals:   06/03/17 1314  BP: 109/61  Pulse: 73    General - Well nourished, well developed, in no apparent distress.  Ophthalmologic - fundi not visualized due to photophobia.  Cardiovascular - Regular rate and rhythm with no murmur.    Mental Status -  Level of arousal and orientation to time, place, and person were intact. Language including expression, naming, repetition, comprehension, reading, and writing was assessed and found intact. Fund of Knowledge was assessed and was intact.  Cranial Nerves II - XII - II - Visual field intact OU. III, IV, VI - Extraocular movements intact. V - Facial sensation intact bilaterally. VII - left nasolabial fold flattening, this is her baseline as per patient. VIII - Hearing & vestibular intact bilaterally, no nystagmus.  X - Palate elevates symmetrically. XI  - Chin turning & shoulder shrug intact bilaterally. XII - Tongue protrusion intact.  Motor Strength - The patient's strength was normal in all extremities and pronator drift was absent. However, with muscle contraction, she felt soreness after exertion in all limbs. Bulk was normal and fasciculations were absent.   Motor Tone - Muscle tone was assessed at the neck and appendages and was normal.  Reflexes - The patient's reflexes were 1+ BUEs, 2+ right petaller and 1+ left petaller, and diminished b/l ankle reflexes and she had no pathological reflexes.  Sensory - Light touch, temperature/pinprick were assessed and were normal.    Coordination - The patient had normal movements in the hands with no ataxia or dysmetria.  Tremor was absent.  Gait and Station - slow speed, and small stride, walk with walker.    Imaging  I have personally reviewed the radiological images below and agree with the radiology interpretations.  CT HEAD Prominent small vessel disease changes without CT evidence of large acute infarct. Small or medium size infarct would be difficult to exclude given the degree white matter changes. Global atrophy without hydrocephalus.  CTA NECK 3 vessel arch with calcified plaque. Aortic atherosclerosis. Plaque with moderate to marked narrowing proximal left subclavian artery, moderate narrowing proximal innominate and proximal left common carotid artery. Moderate narrowing proximal right subclavian artery. Moderate to mark narrowing proximal right common carotid artery. Post right carotid endarterectomy. Plaque with mild narrowing distal right common carotid artery. No significant stenosis right carotid bifurcation. Plaque distal right internal carotid artery with moderate to marked narrowing proximal to the skullbase. Moderate narrowing origin of the left common carotid artery. Plaque with mild to moderate narrowing mid to distal left common carotid artery. Plaque with 76%  diameter stenosis proximal left internal carotid artery. Ectatic proximal vertebral arteries without high-grade stenosis. Several thyroid lesions measuring up to 3.1 cm. Thyroid ultrasound recommended for further delineation.  CTA HEAD  Calcified plaque with mild to slightly mild narrowing cavernous segment internal carotid artery bilaterally. Fetal contribution to the posterior cerebral artery bilaterally. Mild narrowing distal vertebral arteries. Mild narrowing basilar artery. Mild narrowing portions of the posterior cerebral arteries bilaterally.  12/26/15 CUS -  60-79% left recurrent carotid stenosis.  An 1-39 percent right carotid stenosis.  Both vertebral arteries are antegrade  Lab Review 06/21/15 LDL 75 and TG 217    Assessment:   In summary, Caitlin Oliver is a 77 y.o. female with PMH of HTN, HLD, PVD s/p stents in LEs, carotid stenosis s/p b/l CEAs had episode of transient double vision and vertigo on 10/12/15 with neck movement. Continue to have lightheadedness and posterior headache. Her recent CUS indicating progressive left ICA re-stenosis and left VA retrograde flow. Recent CTA head and neck showed left ICA 75% stenosis, right ICA distal stenosis, and left subclavian artery origin severe stenosis. Taken together, it is consistent with subclavian steal syndrome. In addition, patient has worsening symptoms with neck moment, need to consider bow hunter syndrome. Patient BP low at home, today 92/56, likely BP medication related. She needs higher BP goal for any great cerebral perfusion due to multiple vessel stenosis. Will discontinue lisinopril and HCTZ. Had urgent [facility] consultation and received left subclavian artery stenting. Continue to follow up with [facility] and repeat CUS showed left ICA 60-79% stenosis. MRI brain not done due to metal stent in body. BP improved after medication adjustment. However, patient continues to have bilateral occipital neuralgia. Received occipital nerve  block x 2 and free of occipital neuralgia since the second shot. She had left lung cancer surgery in 01/2016 and underwent pain management for left chest pain. 06/2016 again for occipital neuralgia bilaterally, occipital nerve block did not help. Had occipital nerve radiofrequency ablation by pain management, and HA resolved. Also had BPPV maneuver treatment and vertigo is gone. She had recurrent bilateral occipital neuralgia, status post again occipital nerve radiofrequency ablation, headache resolved.  However, since 04/2017, at the same time of a cold, she developed a throbbing, achy pain, weakness feeling in all extremities, proximal > distal, not too much change throughout the day, no weakness on exam, no hyperreflexia. I am concerning for polymyalgia rheumatica but DDx also include polymyositis, MG, CIDP, PAD ,and cervical myelopathy. However, no significant weakness, no fluctuating during the day, no hyperreflexia makes the other DDx less likely. Will check ESR, CRP, CK, aldolase, Ach Abs, EMG/NCS first. If unrevealing, will do MRI C-spine, arterial doppler, or LP.   Plan: - continue ASA and pravastatin for stroke prevention - check BP and glucose at home and record  - follow up with Dr. Trula Slade and Dr. Gwenlyn Found for carotid stenosis.  - Follow up with your primary care physician for stroke risk factor modification. Recommend maintain blood pressure goal 130-150/80, diabetes with hemoglobin A1c goal below 7.0% and lipids with LDL cholesterol goal below 70 mg/dL.  - follow up with Dr. Luan Pulling for pain management - will check above labs for diagnosis and differential diagnosis. Continue use walker for safety - will do EMG/NCS to rule out muscle or nerve condition.  - if above test negative, we may need to do cervical spine MRI or arm leg ultrasound or even LP.  - follow up in 4-6 weeks.   A total of 45 minutes was spent face-to-face with this patient. Over half this time was spent on counseling patient  on the limb throbbing pain diagnosis and different diagnostic and therapeutic options available.    Orders Placed This Encounter  Procedures  . Sedimentation rate  . C-reactive protein  . CK  . ANA  . Aldolase  . Acetylcholine Receptor Ab, All  . Hemoglobin A1c  . CBC  . CMP  . NCV with EMG(electromyography)    Rule out CIDP and MG. Please include repetitive stimulation. Thanks.    Standing Status:   Future    Standing Expiration Date:   06/03/2018    Order Specific Question:   Where should this test be performed?    Answer:   [facility]    No orders of the defined types were placed in this encounter.   Patient Instructions  - continue ASA and pravastatin for stroke prevention - check BP and glucose at home and record  - follow up with Dr. Trula Slade and Dr. Gwenlyn Found for carotid stenosis.  - Follow up with your primary care physician for stroke risk factor modification. Recommend maintain blood pressure goal 130-150/80, diabetes with hemoglobin A1c goal below 7.0% and lipids with LDL cholesterol goal below 70 mg/dL.  - follow up with Dr. Luan Pulling for pain management - will draw blood to evaluate why you have throbbing pain in all limbs. Continue use walker for safety - will do EMG/NCS to rule out muscle or nerve disorder.  - if above test negative, we may need to do cervical spine MRI or arm leg ultrasound.  - follow up in 4-6 weeks.    Rosalin Hawking, MD PhD [facility] [address] [place],  [place] [address] [phone]

## 2017-06-04 DIAGNOSIS — M47812 Spondylosis without myelopathy or radiculopathy, cervical region: Secondary | ICD-10-CM | POA: Diagnosis not present

## 2017-06-04 DIAGNOSIS — Z6832 Body mass index (BMI) 32.0-32.9, adult: Secondary | ICD-10-CM | POA: Diagnosis not present

## 2017-06-04 DIAGNOSIS — M5481 Occipital neuralgia: Secondary | ICD-10-CM | POA: Diagnosis not present

## 2017-06-04 DIAGNOSIS — I10 Essential (primary) hypertension: Secondary | ICD-10-CM | POA: Diagnosis not present

## 2017-06-07 ENCOUNTER — Telehealth: Payer: Self-pay | Admitting: Neurology

## 2017-06-07 NOTE — Telephone Encounter (Signed)
Results not yet reviewed

## 2017-06-07 NOTE — Telephone Encounter (Signed)
Pt would like a call back when the results are avail for her blood work

## 2017-06-08 LAB — COMPREHENSIVE METABOLIC PANEL
A/G RATIO: 2.1 (ref 1.2–2.2)
ALT: 24 IU/L (ref 0–32)
AST: 23 IU/L (ref 0–40)
Albumin: 4.7 g/dL (ref 3.5–4.8)
Alkaline Phosphatase: 69 IU/L (ref 39–117)
BUN/Creatinine Ratio: 22 (ref 12–28)
BUN: 20 mg/dL (ref 8–27)
Bilirubin Total: 0.3 mg/dL (ref 0.0–1.2)
CALCIUM: 10.2 mg/dL (ref 8.7–10.3)
CHLORIDE: 97 mmol/L (ref 96–106)
CO2: 25 mmol/L (ref 20–29)
Creatinine, Ser: 0.89 mg/dL (ref 0.57–1.00)
GFR calc Af Amer: 73 mL/min/{1.73_m2} (ref >59)
GFR, EST NON AFRICAN AMERICAN: 63 mL/min/{1.73_m2} (ref >59)
Globulin, Total: 2.2 g/dL (ref 1.5–4.5)
Glucose: 156 mg/dL — ABNORMAL HIGH (ref 65–99)
POTASSIUM: 5.1 mmol/L (ref 3.5–5.2)
Sodium: 138 mmol/L (ref 134–144)
Total Protein: 6.9 g/dL (ref 6.0–8.5)

## 2017-06-08 LAB — CBC
Hematocrit: 47.5 % — ABNORMAL HIGH (ref 34.0–46.6)
Hemoglobin: 16 g/dL — ABNORMAL HIGH (ref 11.1–15.9)
MCH: 30.3 pg (ref 26.6–33.0)
MCHC: 33.7 g/dL (ref 31.5–35.7)
MCV: 90 fL (ref 79–97)
PLATELETS: 269 10*3/uL (ref 150–379)
RBC: 5.28 x10E6/uL (ref 3.77–5.28)
RDW: 13.2 % (ref 12.3–15.4)
WBC: 7.1 10*3/uL (ref 3.4–10.8)

## 2017-06-08 LAB — ACETYLCHOLINE RECEPTOR AB, ALL: ACETYLCHOL BLOCK AB: 21 % (ref 0–25)

## 2017-06-08 LAB — SEDIMENTATION RATE: Sed Rate: 19 mm/hr (ref 0–40)

## 2017-06-08 LAB — C-REACTIVE PROTEIN: CRP: 2.9 mg/L (ref 0.0–4.9)

## 2017-06-08 LAB — CK: Total CK: 46 U/L (ref 24–173)

## 2017-06-08 LAB — HEMOGLOBIN A1C
Est. average glucose Bld gHb Est-mCnc: 186 mg/dL
HEMOGLOBIN A1C: 8.1 % — AB (ref 4.8–5.6)

## 2017-06-08 LAB — ALDOLASE: ALDOLASE: 4.5 U/L (ref 3.3–10.3)

## 2017-06-08 LAB — ANA: Anti Nuclear Antibody(ANA): NEGATIVE

## 2017-06-10 ENCOUNTER — Ambulatory Visit (HOSPITAL_COMMUNITY)
Admission: RE | Admit: 2017-06-10 | Discharge: 2017-06-10 | Disposition: A | Discharge status: Home / Self Care | Payer: PPO | Source: Ambulatory Visit | Attending: Surgery | Admitting: Surgery

## 2017-06-10 ENCOUNTER — Encounter: Payer: Self-pay | Admitting: Surgery

## 2017-06-10 ENCOUNTER — Other Ambulatory Visit: Payer: Self-pay

## 2017-06-10 ENCOUNTER — Ambulatory Visit: Payer: PPO | Admitting: Surgery

## 2017-06-10 VITALS — BP 98/64 | HR 79 | Temp 97.0°F | Resp 20 | Ht 64.0 in | Wt 183.0 lb

## 2017-06-10 DIAGNOSIS — I771 Stricture of artery: Secondary | ICD-10-CM | POA: Diagnosis not present

## 2017-06-10 DIAGNOSIS — I739 Peripheral vascular disease, unspecified: Secondary | ICD-10-CM | POA: Diagnosis not present

## 2017-06-10 DIAGNOSIS — I6523 Occlusion and stenosis of bilateral carotid arteries: Secondary | ICD-10-CM

## 2017-06-10 DIAGNOSIS — I6529 Occlusion and stenosis of unspecified carotid artery: Secondary | ICD-10-CM

## 2017-06-10 LAB — VAS US CAROTID
LCCADDIAS: 16 cm/s
LEFT ECA DIAS: -14 cm/s
LICADDIAS: -17 cm/s
LICAPDIAS: -26 cm/s
Left CCA dist sys: 90 cm/s
Left CCA prox dias: 11 cm/s
Left CCA prox sys: 94 cm/s
Left ICA dist sys: -78 cm/s
Left ICA prox sys: -155 cm/s
RCCADSYS: -39 cm/s
RCCAPDIAS: 6 cm/s
RIGHT CCA MID DIAS: 11 cm/s
RIGHT ECA DIAS: -4 cm/s
Right CCA prox sys: 76 cm/s

## 2017-06-10 NOTE — Progress Notes (Signed)
HISTORY AND PHYSICAL     CC:  Follow up  Requesting Provider:  [name], PA-C  HPI: This is a 77 y.o. female who returns today for follow up.  She previously underwent right CEA on 03/06/13 and left CEA on 05/07/13 by Dr. Trula Slade.  She subsuquently underwent left subclavian artery stent in August 2017.  She denies and temporary vision loss or speech difficulty.  She states that she developed a cold/flu in the recent past and since then, she has had weakness of all her extremities.  She states that her arms and legs are like jello.  She states it is not the same as when she could walk on a flat surface and rest and the pain become better and then proceed.  She has hx of lower extremity stenting by Dr. Gwenlyn Found.  She states that she is supposed to have further testing on her legs in mid March (EMG) to rule out muscle or nerve disorder.   She states that she does have some shortness of breath with activity but this really hasn't changed since having a left thoracoscopic LU lobectomy in October 2017 by Dr. Roxan Hockey.   She takes a daily aspirin.  The pt is on a statin for cholesterol management.  She takes a beta blocker and CCB for blood pressure management.  She recently had her Metformin dose increased for her diabetes.    She quit smoking 5 years ago.    Past Medical History:  Diagnosis Date  . Anxiety   . Carotid artery disease ([place])   . Claudication ([place])   . COPD (chronic obstructive pulmonary disease) ([place])   . Diabetes mellitus   . Dizzy   . GERD (gastroesophageal reflux disease)   . Headache   . History of kidney stones   . Hypercholesteremia   . Hypertension   . 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 Shortness of breath    with exertion  . Stomach ulcer   . Tobacco abuse     Past Surgical History:  Procedure Laterality Date  . ABDOMINAL HYSTERECTOMY    . APPENDECTOMY    . BREAST REDUCTION SURGERY    . CAROTID  ANGIOGRAM N/A 02/25/2013   Procedure: CAROTID ANGIOGRAM;  Surgeon: Lorretta Harp, MD;  Location: [facility] CATH LAB;  Service: Cardiovascular;  Laterality: N/A;  . ENDARTERECTOMY Right 03/06/2013   Procedure: ENDARTERECTOMY CAROTID-RIGHT;  Surgeon: Serafina Mitchell, MD;  Location: [place];  Service: Vascular;  Laterality: Right;  . ENDARTERECTOMY Left 05/07/2013   Procedure: LEFT CAROTID ARTERY ENDARTERECTOMY WITH VASCU-GUARD PATCH ANGIOPLASTY ;  Surgeon: Serafina Mitchell, MD;  Location: [place];  Service: Vascular;  Laterality: Left;  . LOBECTOMY Left 02/03/2016   Procedure: LEFT UPPER LOBECTOMY;  Surgeon: [name], MD;  Location: [place];  Service: Thoracic;  Laterality: Left;  . LOWER EXTREMITY ANGIOGRAM N/A 02/25/2013   Procedure: LOWER EXTREMITY ANGIOGRAM;  Surgeon: Lorretta Harp, MD;  Location: [facility] CATH LAB;  Service: Cardiovascular;  Laterality: N/A;  . LOWER EXTREMITY ANGIOGRAM N/A 07/27/2013   Procedure: LOWER EXTREMITY ANGIOGRAM;  Surgeon: Lorretta Harp, MD;  Location: [facility] CATH LAB;  Service: Cardiovascular;  Laterality: N/A;  . PATCH ANGIOPLASTY Right 03/06/2013   Procedure: PATCH ANGIOPLASTY of Right Carotid Artery using Vascu-Guard Patch;  Surgeon: Serafina Mitchell, MD;  Location: [place];  Service: Vascular;  Laterality: Right;  . PERIPHERAL VASCULAR CATHETERIZATION N/A 11/15/2015   Procedure: Aortic  Arch Angiography;  Surgeon: Serafina Mitchell, MD;  Location: [place] CV LAB;  Service: Cardiovascular;  Laterality: N/A;  . PERIPHERAL VASCULAR CATHETERIZATION Bilateral 11/15/2015   Procedure: Carotid Angiography;  Surgeon: Serafina Mitchell, MD;  Location: [place] CV LAB;  Service: Cardiovascular;  Laterality: Bilateral;  . PERIPHERAL VASCULAR CATHETERIZATION Left 11/15/2015   Procedure: Upper Extremity Angiography;  Surgeon: Serafina Mitchell, MD;  Location: [place] CV LAB;  Service: Cardiovascular;  Laterality: Left;  . PERIPHERAL VASCULAR CATHETERIZATION Left 11/15/2015   Procedure:  Peripheral Vascular Intervention;  Surgeon: Serafina Mitchell, MD;  Location: [place] CV LAB;  Service: Cardiovascular;  Laterality: Left;  subclavian   . SALIVARY GLAND SURGERY     scar tissue removed from left saliva glad  . TUBAL LIGATION    . VIDEO ASSISTED THORACOSCOPY (VATS)/WEDGE RESECTION Left 02/03/2016   Procedure: VIDEO ASSISTED THORACOSCOPY;  Surgeon: [name], MD;  Location: [place];  Service: Thoracic;  Laterality: Left;    Allergies  Allergen Reactions  . No Known Allergies Other (See Comments)    Current Outpatient Medications  Medication Sig Dispense Refill  . aspirin EC 81 MG tablet Take 81 mg by mouth daily.    [name] BIOTIN 5000 PO Take by mouth.    . calcium carbonate (CALCIUM 600) 600 MG TABS tablet Take by mouth.    . diazepam (VALIUM) 5 MG tablet Take 5 mg by mouth 2 (two) times daily. Anxiety    . diltiazem (DILACOR XR) 180 MG 24 hr capsule Take 180 mg by mouth.    . fish oil-omega-3 fatty acids 1000 MG capsule Take 1 g by mouth daily.     [name] HYDROcodone-acetaminophen (NORCO/VICODIN) 5-325 MG tablet Take 1 tablet by mouth every 6 (six) hours as needed for moderate pain.    . Magnesium Gluconate 550 MG TABS Take 250 mg by mouth.     . metFORMIN (GLUCOPHAGE) 500 MG tablet Take 500 mg by mouth 2 (two) times daily with a meal.     . metoprolol tartrate (LOPRESSOR) 25 MG tablet Take 0.5 tablets (12.5 mg total) by mouth 2 (two) times daily. (Patient taking differently: Take 12.5 mg by mouth 2 (two) times daily. ) 30 tablet 1  . naphazoline-glycerin (CLEAR EYES) 0.012-0.2 % SOLN Place 1-2 drops into both eyes 4 (four) times daily as needed (for itchy eyes).    . NONFORMULARY OR COMPOUNDED ITEM [facility]:  Combo Pain #1 - Baclofen 3%, Gabapentin 8%, Aripiprazole 3.7%, Ketorolac 8%, Licocaine 5%, dispense 240grams, apply 1-2 pumps to focal pain area 3-4 times daily, rub in 1-2 minutes, +3Refills (Patient taking differently: Apply 1 application  topically 3 (three) times daily as needed (for foot pain.). [facility]:  Combo Pain #1 - Baclofen 3%, Gabapentin 8%, Aripiprazole 6.2%, Ketorolac 8%, Licocaine 5%, dispense 240grams, apply 1-2 pumps to focal pain area 3-4 times daily, rub in 1-2 minutes, +3Refills) 240 each 3  . ONE TOUCH ULTRA TEST test strip     . pantoprazole (PROTONIX) 40 MG tablet Take 40 mg by mouth 2 (two) times daily.     . pravastatin (PRAVACHOL) 40 MG tablet Take 40 mg by mouth daily.    [name] STIOLTO RESPIMAT 2.5-2.5 MCG/ACT AERS Take 1 puff by mouth daily. (Patient taking differently: Take 2 puffs by mouth daily. ) 1 Inhaler 0   No current facility-administered medications for this visit.     Family History  Problem Relation Age of Onset  .  Stroke Mother   . Hypertension Mother   . Kidney failure Father     Social History   Socioeconomic History  . Marital status: Widowed    Spouse name: Not on file  . Number of children: Not on file  . Years of education: Not on file  . Highest education level: Not on file  Social Needs  . Financial resource strain: Not on file  . Food insecurity - worry: Not on file  . Food insecurity - inability: Not on file  . Transportation needs - medical: Not on file  . Transportation needs - non-medical: Not on file  Occupational History  . Not on file  Tobacco Use  . Smoking status: Former Smoker    Packs/day: 1.50    Years: 56.00    Pack years: 84.00    Types: Cigarettes    Last attempt to quit: 07/07/2013    Years since quitting: 3.9  . Smokeless tobacco: Former Systems developer    Quit date: 02/25/2013  Substance and Sexual Activity  . Alcohol use: No  . Drug use: No  . Sexual activity: No  Other Topics Concern  . Not on file  Social History Narrative  . Not on file     REVIEW OF SYSTEMS:   _0  denotes positive finding, _1  denotes negative finding Cardiac  Comments:  Chest pain or chest pressure:    Shortness of breath upon exertion: x   Short of  breath when lying flat:    Irregular heart rhythm:        Vascular    Pain in calf, thigh, or hip brought on by ambulation:    Pain in feet at night that wakes you up from your sleep:  x   Blood clot in your veins:    Leg swelling:         Pulmonary    Oxygen at home:    Productive cough:     Wheezing:  x       Neurologic    Sudden weakness in arms or legs:  x See HPI  Sudden numbness in arms or legs:  x See HPI  Sudden onset of difficulty speaking or slurred speech:    Temporary loss of vision in one eye:     Problems with dizziness:  x       Gastrointestinal    Blood in stool:     Vomited blood:         Genitourinary    Burning when urinating:     Blood in urine:        Psychiatric    Major depression:         Hematologic    Bleeding problems:    Problems with blood clotting too easily:        Skin    Rashes or ulcers:        Constitutional    Fever or chills:      PHYSICAL EXAMINATION:  Vitals:   06/10/17 1405 06/10/17 1409  BP: 107/75 98/64  Pulse: 78 79  Resp: 20   Temp: (!) 97 F (36.1 C)   SpO2: 94%    Body mass index is 31.41 kg/m.  General:  WDWN in NAD; vital signs documented above Gait: slowly with walker HENT: WNL, normocephalic Pulmonary: normal non-labored breathing  Cardiac: regular HR, without  Murmurs, rubs or gallops; without carotid bruits Skin: without rashes Vascular Exam/Pulses:  Right Left  Radial 2+ (normal) 2+ (normal)  Ulnar Unable  to palpate  Unable to palpate   DP 2+ (normal) 2+ (normal)  PT Unable to palpate  Unable to palpate    Extremities: without ischemic changes, without Gangrene , without cellulitis; without open wounds;  Musculoskeletal: no muscle wasting or atrophy  Neurologic: A&O X 3;  No focal weakness or paresthesias are detected Psychiatric:  The pt has Normal affect.   Non-Invasive Vascular Imaging:   Carotid Duplex on 06/10/17: -1-39% bilateral carotid artery stenosis -both vertebral arteries  patent with antegrade flow -subclavian arteries bilaterally with normal flow hemodynamics  Pt meds includes: Statin:  Yes.   Beta Blocker:  Yes.   Aspirin:  Yes.   ACEI:  No. ARB:  No. CCB use:  Yes Other Antiplatelet/Anticoagulant:  No   ASSESSMENT/PLAN:: 77 y.o. female with hx of staged bilateral carotid endarterectomies and left subclavian stent placement by Dr. Trula Slade returns today for follow up.   -pt's carotid duplex reveals 1-39% bilateral carotid artery stenosis.  The pt has weakness of bilateral arms/legs that is being worked up by neurology.  She asymptomatic from her carotid disease.  Her left subclavian stent has normal hemodynamic flow.  -We will have her follow up in one year with NP with carotid duplex   Leontine Locket, PA-C [facility] [phone]  Clinic MD:  Pt seen and examined with Dr. Trula Slade  I agree with the above.  I seen and evaluated the patient.  She is status post bilateral carotid endarterectomy and left subclavian stent.  Ultrasound shows all of these to be widely patent.  She will follow-up in 1 year with repeat ultrasound imaging  Wells Donnelle Olmeda

## 2017-06-10 NOTE — Telephone Encounter (Signed)
Rn call patient about her lab work. Rn stated that the blood test done last week in our office was all negative except sugar level was high and her DM not in good control. She needs better control her sugar and we will wait for the nerve conduction study. Please continue current treatment. Patient verbalized understanding.   ------

## 2017-06-11 ENCOUNTER — Ambulatory Visit (HOSPITAL_BASED_OUTPATIENT_CLINIC_OR_DEPARTMENT_OTHER)
Admission: RE | Admit: 2017-06-11 | Discharge: 2017-06-11 | Disposition: A | Discharge status: Home / Self Care | Payer: PPO | Source: Ambulatory Visit | Attending: Cardiovascular Disease | Admitting: Cardiovascular Disease

## 2017-06-11 ENCOUNTER — Encounter: Payer: Self-pay | Admitting: Cardiovascular Disease

## 2017-06-11 ENCOUNTER — Ambulatory Visit: Payer: PPO | Admitting: Cardiovascular Disease

## 2017-06-11 VITALS — BP 132/80 | HR 65 | Ht 64.0 in | Wt 185.0 lb

## 2017-06-11 DIAGNOSIS — Z720 Tobacco use: Secondary | ICD-10-CM

## 2017-06-11 DIAGNOSIS — E7800 Pure hypercholesterolemia, unspecified: Secondary | ICD-10-CM | POA: Diagnosis not present

## 2017-06-11 DIAGNOSIS — I6523 Occlusion and stenosis of bilateral carotid arteries: Secondary | ICD-10-CM

## 2017-06-11 DIAGNOSIS — I739 Peripheral vascular disease, unspecified: Secondary | ICD-10-CM

## 2017-06-11 NOTE — Assessment & Plan Note (Signed)
History of essential hypertension blood pressure 132/80. She is on diltiazem, and Lopressor. Continue current meds at current dosing.

## 2017-06-11 NOTE — Assessment & Plan Note (Signed)
History of bilateral carotid disease status post staged endarterectomies by Dr. Trula Slade in the past which she follows by duplex ultrasound.

## 2017-06-11 NOTE — Assessment & Plan Note (Signed)
History of discontinued tobacco abuse over 5 years ago.

## 2017-06-11 NOTE — Addendum Note (Signed)
Addended by: Zebedee Iba on: 06/11/2017 11:15 AM   Modules accepted: Orders

## 2017-06-11 NOTE — Assessment & Plan Note (Signed)
History of PAD status post diamondback orbital rotational atherectomy, PTA and stenting of high-grade bilateral calcified iliac stenoses. She had Doppler studies performed today. She denies claudication.

## 2017-06-11 NOTE — Patient Instructions (Signed)
Medication Instructions: Your physician recommends that you continue on your current medications as directed. Please refer to the Current Medication list given to you today.  Testing:  In 1 Year: Your physician has requested that you have a aorta and iliac duplex. During this test, an ultrasound is used to evaluate blood flow to the aorta and iliac arteries. Allow one hour for this exam. Do not eat after midnight the day before and avoid carbonated beverages.   Your physician has requested that you have an ankle brachial index (ABI). During this test an ultrasound and blood pressure cuff are used to evaluate the arteries that supply the arms and legs with blood. Allow thirty minutes for this exam. There are no restrictions or special instructions.   Follow-Up: Your physician wants you to follow-up in: 1 year with Dr. Gwenlyn Found. You will receive a reminder letter in the mail two months in advance. If you don't receive a letter, please call our office to schedule the follow-up appointment.  If you need a refill on your cardiac medications before your next appointment, please call your pharmacy.

## 2017-06-11 NOTE — Progress Notes (Signed)
06/11/2017 ELAINNA ESHLEMAN   1940/12/27  [id number]  Primary Physician [name], PA-C Primary Cardiologist: Lorretta Harp MD FACP, [place], [place], [place]  HPI:  OASIS GOEHRING is a 77 y.o.  mildly overweight widowed Caucasian female no children (son died 106 years ago) referred by Emmie Niemann PA-C at [facility] at [facility] for evaluation and treatment of lifestyle limiting claudication. I last saw her in the office  06/06/16. Her .Risk factors included hypertension, hyperlipidemia and type 2 diabetes. She has a 75-pack-year history of tobacco abuse smoking one and a half packs a day however she has recently quit smoking prior to her interventions.. There's no family history of heart disease. She's never had a heart for stroke. She does complain of dyspnea but denies chest pain. She complains of bilateral lower extremity claudication while walking up an incline which is lifestyle limiting. I age of Imdur 02/25/13 revealing high-grade bilateral internal carotid artery stenosis, high-grade bilateral calcified iliac artery stenosis as most common femoral artery stenoses. She had staged right followed by left carotid endarterectomy performed by Dr. Trula Slade which she has recovered from. Because of lifestyle limiting claudication she underwent diamondback orbital rotational atherectomy, PT A. And stenting using ICast covered stents by myself on 07/27/13 which resulted in complete resolution of her claudication symptoms. Her Dopplers normalized as well though she did have a high-frequency signal in the right common femoral artery. She has had a GI bleed in the past and her Plavix was discontinued. She denies chest pain, shortness of breath as well as claudication. She has had left subclavian stenting by Dr. Trula Slade 11/15/15. In addition, she had left upper lobectomy by Dr. Roxan Hockey 02/03/16 because of stage IA squamous cell carcinoma. She has recovered from this. Since I saw her a  year ago she's remained clinically stable.     Current Meds  Medication Sig  . aspirin EC 81 MG tablet Take 81 mg by mouth daily.  [name] BIOTIN 5000 PO Take by mouth.  . calcium carbonate (CALCIUM 600) 600 MG TABS tablet Take by mouth.  . diazepam (VALIUM) 5 MG tablet Take 5 mg by mouth 2 (two) times daily. Anxiety  . diltiazem (DILACOR XR) 180 MG 24 hr capsule Take 180 mg by mouth.  . fish oil-omega-3 fatty acids 1000 MG capsule Take 1 g by mouth daily.   [name] HYDROcodone-acetaminophen (NORCO/VICODIN) 5-325 MG tablet Take 1 tablet by mouth every 6 (six) hours as needed for moderate pain.  . Magnesium Gluconate 550 MG TABS Take 250 mg by mouth.   . metFORMIN (GLUCOPHAGE) 500 MG tablet Take 500 mg by mouth 2 (two) times daily with a meal.   . metoprolol tartrate (LOPRESSOR) 25 MG tablet Take 0.5 tablets (12.5 mg total) by mouth 2 (two) times daily. (Patient taking differently: Take 12.5 mg by mouth 2 (two) times daily. )  . naphazoline-glycerin (CLEAR EYES) 0.012-0.2 % SOLN Place 1-2 drops into both eyes 4 (four) times daily as needed (for itchy eyes).  . NONFORMULARY OR COMPOUNDED ITEM [facility]:  Combo Pain #1 - Baclofen 3%, Gabapentin 8%, Aripiprazole 4.1%, Ketorolac 8%, Licocaine 5%, dispense 240grams, apply 1-2 pumps to focal pain area 3-4 times daily, rub in 1-2 minutes, +3Refills (Patient taking differently: Apply 1 application topically 3 (three) times daily as needed (for foot pain.). [facility]:  Combo Pain #1 - Baclofen 3%, Gabapentin 8%, Aripiprazole 4.2%, Ketorolac 8%, Licocaine 5%, dispense 240grams, apply 1-2 pumps to focal  pain area 3-4 times daily, rub in 1-2 minutes, +3Refills)  . ONE TOUCH ULTRA TEST test strip   . pantoprazole (PROTONIX) 40 MG tablet Take 40 mg by mouth 2 (two) times daily.   . pravastatin (PRAVACHOL) 40 MG tablet Take 40 mg by mouth daily.  [name] STIOLTO RESPIMAT 2.5-2.5 MCG/ACT AERS Take 1 puff by mouth daily. (Patient taking  differently: Take 2 puffs by mouth daily. )     Allergies  Allergen Reactions  . No Known Allergies Other (See Comments)    Social History   Socioeconomic History  . Marital status: Widowed    Spouse name: Not on file  . Number of children: Not on file  . Years of education: Not on file  . Highest education level: Not on file  Social Needs  . Financial resource strain: Not on file  . Food insecurity - worry: Not on file  . Food insecurity - inability: Not on file  . Transportation needs - medical: Not on file  . Transportation needs - non-medical: Not on file  Occupational History  . Not on file  Tobacco Use  . Smoking status: Former Smoker    Packs/day: 1.50    Years: 56.00    Pack years: 84.00    Types: Cigarettes    Last attempt to quit: 07/07/2013    Years since quitting: 3.9  . Smokeless tobacco: Former Systems developer    Quit date: 02/25/2013  Substance and Sexual Activity  . Alcohol use: No  . Drug use: No  . Sexual activity: No  Other Topics Concern  . Not on file  Social History Narrative  . Not on file     Review of Systems: General: negative for chills, fever, night sweats or weight changes.  Cardiovascular: negative for chest pain, dyspnea on exertion, edema, orthopnea, palpitations, paroxysmal nocturnal dyspnea or shortness of breath Dermatological: negative for rash Respiratory: negative for cough or wheezing Urologic: negative for hematuria Abdominal: negative for nausea, vomiting, diarrhea, bright red blood per rectum, melena, or hematemesis Neurologic: negative for visual changes, syncope, or dizziness All other systems reviewed and are otherwise negative except as noted above.    Blood pressure 132/80, pulse 65, height _0  (1.626 m), weight 185 lb (83.9 kg).  General appearance: alert and no distress Neck: no adenopathy, no carotid bruit, no JVD, supple, symmetrical, trachea midline and thyroid not enlarged, symmetric, no  tenderness/mass/nodules Lungs: clear to auscultation bilaterally Heart: regular rate and rhythm, S1, S2 normal, no murmur, click, rub or gallop Extremities: extremities normal, atraumatic, no cyanosis or edema Pulses: 2+ and symmetric Skin: Skin color, texture, turgor normal. No rashes or lesions Neurologic: Alert and oriented X 3, normal strength and tone. Normal symmetric reflexes. Normal coordination and gait  EKG sinus rhythm at 65 without ST or T-wave changes. I personally reviewed this EKG  ASSESSMENT AND PLAN:   Claudication [facility]) History of PAD status post diamondback orbital rotational atherectomy, PTA and stenting of high-grade bilateral calcified iliac stenoses. She had Doppler studies performed today. She denies claudication.  Essential hypertension History of essential hypertension blood pressure 132/80. She is on diltiazem, and Lopressor. Continue current meds at current dosing.  Hyperlipidemia History of hyperlipidemia on statin therapy followed by her PCP  Carotid artery disease ([place]) History of bilateral carotid disease status post staged endarterectomies by Dr. Trula Slade in the past which she follows by duplex ultrasound.  Tobacco abuse History of discontinued tobacco abuse over 5 years ago.  Lorretta Harp MD FACP,FACC,FAHA, [facility] 06/11/2017 11:07 AM

## 2017-06-11 NOTE — Assessment & Plan Note (Signed)
History of hyperlipidemia on statin therapy followed by her PCP

## 2017-06-12 ENCOUNTER — Encounter: Payer: PPO | Admitting: Neurology

## 2017-06-17 ENCOUNTER — Encounter (HOSPITAL_COMMUNITY): Payer: PPO

## 2017-06-17 ENCOUNTER — Ambulatory Visit: Payer: PPO | Admitting: Surgery

## 2017-06-17 ENCOUNTER — Encounter: Payer: PPO | Admitting: Neurology

## 2017-06-17 DIAGNOSIS — I10 Essential (primary) hypertension: Secondary | ICD-10-CM | POA: Diagnosis not present

## 2017-06-17 DIAGNOSIS — E119 Type 2 diabetes mellitus without complications: Secondary | ICD-10-CM | POA: Diagnosis not present

## 2017-06-17 DIAGNOSIS — E7800 Pure hypercholesterolemia, unspecified: Secondary | ICD-10-CM | POA: Diagnosis not present

## 2017-06-17 DIAGNOSIS — F419 Anxiety disorder, unspecified: Secondary | ICD-10-CM | POA: Diagnosis not present

## 2017-06-19 ENCOUNTER — Telehealth: Payer: Self-pay | Admitting: Neurology

## 2017-06-19 NOTE — Telephone Encounter (Signed)
IF patient calls back please tell she has to call her insurance company about her copayment. If she calls back transfer her to billing. I dont think this test requires a authorization. Message sent to [facility] in billing.  Rn left vm for patient to call back.

## 2017-06-19 NOTE — Telephone Encounter (Signed)
Patient called back regarding previous message and I transferred call to [place] in billing.

## 2017-06-19 NOTE — Telephone Encounter (Signed)
Pt called stating the insurance company is requesting a call to discuss the NCV and EMG and the co pay for the pt contact at [place] [phone]. Pt requesting a call back once everything is situated

## 2017-06-20 ENCOUNTER — Telehealth: Payer: Self-pay | Admitting: Cardiovascular Disease

## 2017-06-20 ENCOUNTER — Other Ambulatory Visit: Payer: Self-pay | Admitting: Cardiovascular Disease

## 2017-06-20 DIAGNOSIS — E7800 Pure hypercholesterolemia, unspecified: Secondary | ICD-10-CM | POA: Diagnosis not present

## 2017-06-20 DIAGNOSIS — I739 Peripheral vascular disease, unspecified: Secondary | ICD-10-CM

## 2017-06-20 NOTE — Telephone Encounter (Signed)
New Message   Patient states that she is returning call in reference to lab results. Please call.

## 2017-06-24 ENCOUNTER — Ambulatory Visit (INDEPENDENT_AMBULATORY_CARE_PROVIDER_SITE_OTHER): Payer: PPO | Admitting: Neurology

## 2017-06-24 ENCOUNTER — Encounter: Payer: Self-pay | Admitting: Neurology

## 2017-06-24 DIAGNOSIS — H8110 Benign paroxysmal vertigo, unspecified ear: Secondary | ICD-10-CM

## 2017-06-24 DIAGNOSIS — M79605 Pain in left leg: Secondary | ICD-10-CM

## 2017-06-24 DIAGNOSIS — M79602 Pain in left arm: Secondary | ICD-10-CM

## 2017-06-24 DIAGNOSIS — M79601 Pain in right arm: Secondary | ICD-10-CM

## 2017-06-24 DIAGNOSIS — M79604 Pain in right leg: Secondary | ICD-10-CM

## 2017-06-24 NOTE — Procedures (Signed)
HISTORY:  Caitlin Oliver is a 77 year old patient with a history of a viral illness that occurred about 6 weeks ago.  Since that time she has noted diffuse myalgias and sensation of diffuse weakness on all 4 extremities.  The patient has not improved, she has not progressed either.  She has been sent for an evaluation of a possible neuropathy or a myopathic disorder.  NERVE CONDUCTION STUDIES:  Nerve conduction studies were performed on the left upper extremity. The distal motor latencies and motor amplitudes for the median and ulnar nerves were within normal limits. The nerve conduction velocities for these nerves were also normal. The sensory latencies for the median and ulnar nerves were normal.  The F-wave latency for the left ulnar nerve was normal.  Nerve conduction studies were performed on left lower extremity. The distal motor latencies and motor amplitudes for the peroneal and posterior tibial nerves were within normal limits. The nerve conduction velocities for these nerves were also normal. The F-wave latency for the left posterior tibial nerve was normal.  The sensory latencies for the peroneal and sural nerves were within normal limits.  A repetitive nerve stimulation study was done on the left trapezius muscle stimulation to the accessory spinal nerve.  After 1 minute of muscle fatigue, stimulations at 3 Hz were done at 30 seconds, 1 minute, 2 minutes, 3 minutes, 4 minutes, and 5 minutes following muscle fatigue.  No significant decremental response was seen.   EMG STUDIES:  EMG study was performed on the left upper extremity:  The first dorsal interosseous muscle reveals 2 to 4 K units with full recruitment. No fibrillations or positive waves were noted. The abductor pollicis brevis muscle reveals 2 to 4 K units with full recruitment. No fibrillations or positive waves were noted. The extensor indicis proprius muscle reveals 1 to 3 K units with full recruitment. No  fibrillations or positive waves were noted. The pronator teres muscle reveals 2 to 3 K units with full recruitment. No fibrillations or positive waves were noted. The biceps muscle reveals 1 to 2 K units with full recruitment. No fibrillations or positive waves were noted. The triceps muscle reveals 2 to 4 K units with full recruitment. No fibrillations or positive waves were noted. The anterior deltoid muscle reveals 2 to 3 K units with full recruitment. No fibrillations or positive waves were noted. The cervical paraspinal muscles were tested at 2 levels. No abnormalities of insertional activity were seen at either level tested. There was poor relaxation.  EMG study was performed on the left lower extremity:  The tibialis anterior muscle reveals 2 to 4K motor units with full recruitment. No fibrillations or positive waves were seen. The peroneus tertius muscle reveals 2 to 4K motor units with full recruitment. No fibrillations or positive waves were seen. The medial gastrocnemius muscle reveals 1 to 3K motor units with full recruitment. No fibrillations or positive waves were seen. The vastus lateralis muscle reveals 2 to 4K motor units with full recruitment. No fibrillations or positive waves were seen. The iliopsoas muscle reveals 2 to 4K motor units with full recruitment. No fibrillations or positive waves were seen. The biceps femoris muscle (long head) reveals 2 to 4K motor units with full recruitment. No fibrillations or positive waves were seen. The lumbosacral paraspinal muscles were tested at 3 levels, and revealed no abnormalities of insertional activity at all 3 levels tested. There was fair relaxation.   IMPRESSION:  Nerve conduction studies done on the  left upper and left lower extremities were within normal limits.  No evidence of a peripheral neuropathy was seen.  EMG evaluation of the left upper and left lower extremities were relatively unremarkable without evidence of an  overlying radiculopathy or a myopathic disorder.   The repetitive nerve stimulation study done on the left shoulder was unremarkable, no clear evidence of a neuromuscular transmission disorder was seen.  Jill Alexanders MD 06/24/2017 11:35 AM  [facility] [address] [place], [place] [address]  Phone [phone] Fax [phone]

## 2017-06-24 NOTE — Progress Notes (Signed)
Please refer to EMG and nerve conduction study procedure note. 

## 2017-06-24 NOTE — Telephone Encounter (Signed)
Follow Up  Pt returning call for nurse

## 2017-06-24 NOTE — Telephone Encounter (Signed)
[email or web address]

## 2017-06-25 ENCOUNTER — Telehealth: Payer: Self-pay | Admitting: Neurology

## 2017-06-25 DIAGNOSIS — M542 Cervicalgia: Secondary | ICD-10-CM

## 2017-06-25 NOTE — Telephone Encounter (Signed)
Discussed with patient over the phone.  Her EMG today showed essentially normal study.  No neuropathy or myopathy.  Her labs last time also unremarkable except A1c 8.1.  She said she still had this similar symptoms and not changed much since she saw me last time.  She still have both arm and leg pain on walking, felt sore and painful inside.  After sitting for long time, lower back also sore.  I have difficulty with the cause of her symptoms given all tests were essentially negative.  She said her lung cancer has been in remission and keep her follow with oncology without issue.  She refused lumbar puncture.  I will do C-spine MRI for further evaluation.  If that is negative, I will refer her to see Dr. Krista Blue for further evaluation. Pt will see me on 07/16/17 for follow up.  Rosalin Hawking, MD PhD Stroke Neurology 06/25/2017 6:28 PM  Orders Placed This Encounter  Procedures  . MR CERVICAL SPINE W WO CONTRAST    Standing Status:   Future    Standing Expiration Date:   08/26/2018    Order Specific Question:   If indicated for the ordered procedure, I authorize the administration of contrast media per Radiology protocol    Answer:   Yes    Order Specific Question:   What is the patient's sedation requirement?    Answer:   No Sedation    Order Specific Question:   Does the patient have a pacemaker or implanted devices?    Answer:   No    Order Specific Question:   Radiology Contrast Protocol - do NOT remove file path    Answer:   [email or web address]    Order Specific Question:   Preferred imaging location?    Answer:   Internal

## 2017-06-26 ENCOUNTER — Telehealth: Payer: Self-pay | Admitting: Neurology

## 2017-06-26 NOTE — Telephone Encounter (Signed)
Health team faxed clinical notes for auth order sent to GI they will contact the patient to schedule.

## 2017-06-26 NOTE — Progress Notes (Signed)
    [place]    Nerve / Sites Muscle Latency Ref. Amplitude Ref. Rel Amp Segments Distance Velocity Ref. Area    ms ms mV mV %  cm m/s m/s mVms  L Median - APB     Wrist APB 4.0 ?4.4 4.0 ?4.0 100 Wrist - APB 7   16.3     Upper arm APB 8.1  3.9  98.7 Upper arm - Wrist 21 52 ?49 16.6  L Ulnar - ADM     Wrist ADM 2.4 ?3.3 10.4 ?6.0 100 Wrist - ADM 7   33.1     B.Elbow ADM 5.9  10.0  96.5 B.Elbow - Wrist 19 54 ?49 32.1     A.Elbow ADM 7.7  9.6  96.1 A.Elbow - B.Elbow 10 56 ?49 31.5         A.Elbow - Wrist      L Peroneal - EDB     Ankle EDB 4.7 ?6.5 3.4 ?2.0 100 Ankle - EDB 9   13.1     Fib head EDB 10.3  3.2  93.1 Fib head - Ankle 28 50 ?44 12.8     Pop fossa EDB 12.7  3.2  103 Pop fossa - Fib head 12 51 ?44 13.8         Pop fossa - Ankle      L Tibial - AH     Ankle AH 3.8 ?5.8 15.1 ?4.0 100 Ankle - AH 9   45.0     Pop fossa AH 12.1  8.0  52.8 Pop fossa - Ankle 35 42 ?41 30.4             SNC    Nerve / Sites Rec. Site Peak Lat Ref.  Amp Ref. Segments Distance    ms ms V V  cm  L Sural - Ankle (Calf)     Calf Ankle 4.3 ?4.4 7 ?6 Calf - Ankle 14  L Superficial peroneal - Ankle     Lat leg Ankle 4.2 ?4.4 4 ?6 Lat leg - Ankle 14  L Median - Orthodromic (Dig II, Mid palm)     Dig II Wrist 3.1 ?3.4 18 ?10 Dig II - Wrist 13  L Ulnar - Orthodromic, (Dig V, Mid palm)     Dig V Wrist 2.7 ?3.1 5 ?5 Dig V - Wrist 25             F  Wave    Nerve F Lat Ref.   ms ms  L Tibial - AH 51.7 ?56.0  L Ulnar - ADM 27.0 ?32.0         Rep Stim    Anatomy / Train Rate Ampl. Ampl 4-1 Fac Ampl Area Area 4-1 Fac Area   Hz mV % % mVms % %  L Trapezius (upper) - (Accessory spinal)  Baseline _0  1 4.3 1.1 100 33.3 1.2 100  Baseline _1  3 4.5 -0.5 104 34.7 -9.4 104  Post Exercise _2 :00 3 4.3 1.3 99.9 34.3 -5.6 103  @ 0:30 3 4.3 -3.3 98.8 32.4 -5.1 97.5  @ 1:00 3 3.6 -2.1 83.1 27.6 -11.2 83.1  @ 2:00 3 4.5 0.5 104 35.0 -12 105  @ 3:00 3 4.5 -2.6 104 33.8 -10.1 102  @ 4:00 3 4.4 0.6 102 32.9 1.2  98.9  @ 5:00 3 4.4 0 101 31.8 -3.4 95.4         EMG full

## 2017-07-05 ENCOUNTER — Telehealth: Payer: Self-pay | Admitting: Neurology

## 2017-07-05 NOTE — Telephone Encounter (Signed)
Noted. We can cancel the MRI at this time. However, please let the pt know, if the arm pain comes back, we still need the MRI to be done. Thanks.   Rosalin Hawking, MD PhD Stroke Neurology 07/05/2017 11:11 AM

## 2017-07-05 NOTE — Telephone Encounter (Signed)
Pt states she is no longer having pain in her arms so she does not feel she needs the MRI any longer.  Pt asking it be cancelled

## 2017-07-05 NOTE — Telephone Encounter (Signed)
I called and spoke with patient and made her aware to let us know if that pain returns. She voiced understanding and appreciation.

## 2017-07-08 NOTE — Telephone Encounter (Signed)
Noted

## 2017-07-16 ENCOUNTER — Ambulatory Visit (INDEPENDENT_AMBULATORY_CARE_PROVIDER_SITE_OTHER): Payer: PPO | Admitting: Neurology

## 2017-07-16 ENCOUNTER — Encounter: Payer: Self-pay | Admitting: Neurology

## 2017-07-16 VITALS — BP 139/70 | HR 69 | Wt 188.4 lb

## 2017-07-16 DIAGNOSIS — H8110 Benign paroxysmal vertigo, unspecified ear: Secondary | ICD-10-CM | POA: Diagnosis not present

## 2017-07-16 DIAGNOSIS — I6523 Occlusion and stenosis of bilateral carotid arteries: Secondary | ICD-10-CM

## 2017-07-16 DIAGNOSIS — M5481 Occipital neuralgia: Secondary | ICD-10-CM | POA: Diagnosis not present

## 2017-07-16 DIAGNOSIS — M542 Cervicalgia: Secondary | ICD-10-CM | POA: Diagnosis not present

## 2017-07-16 MED ORDER — CYCLOBENZAPRINE HCL 5 MG PO TABS: 5.0000 mg | ORAL_TABLET | Freq: Three times a day (TID) | ORAL | 0 refills | Status: DC | PRN

## 2017-07-16 NOTE — Progress Notes (Signed)
NEUROLOGY CLINIC FOLLOW UP NOTE  NAME: Caitlin Oliver DOB: 03/21/1941  HPI: Caitlin Oliver is a 77 y.o. female with PMH of HTN, HLD, PVD, and carotid stenosis s/p b/l CEAs who presents as a new patient for Episode of double vision and vertigo.   She has bilateral carotid stenosis s/p bilateral CEAs around 2014 about 6 weeks apart with Dr. Trula Slade. Followed with Dr. Gwenlyn Found and CUS on 09/01/14 showed left ICA 50-69% stenosis with left VA retrograde flow. She had recent CUS on 04/27/15 showed right ICA 1-39% stenosis but left ICA progressed to 60-79% stenosis with left VA retrograde flow.  Patient stated that on 10/12/2015 she was in the drugstore reading signs over her head, when she bent down her head she felt sudden onset double vision, and vertigo. Double vision was horizontal diplopia lasted about one hour and gradually resolved. Vertigo feels like head round and round, and she had to sit down due to imbalance, no nausea vomiting, no fall, lasted about one hour gradually resolved. After that, she continued to feel swimmy headed with headache, walking to the right side, had to hold on things on walking. Went to see PCP that day, blood pressure 140/70, recommended to go to ER, however patient refused, only agreed to do CTA head and neck. She had to use a walker for the next 2 days, and she still felt lightheaded for about 2 weeks. She had a CTA head and neck on 10/20/2015 which showed stenosis of right CCA and distal right ICA, as well as left ICA 76% stenosis. In addition, left subclavian artery significant narrowing and origin, proximal to left VA takeoff. Consistent with left VA regional grade flow seen on CUS and therefore subclavian steal syndrome.   Patient today in clinic continues to complain of lightheadedness, posterior head headache, light sensitivity. Check blood pressure 92/56. She stated that she took BP meds this morning. She also stated that her BP is also low at home.    She has hx of  HTN, HLD following with Dr. Gwenlyn Found, on pravastatin, diltiazem, HCTZ, metoprolol and lisinopirl. She also has PVD s/p diamondback orbital rotational atherectomy, PTA and stenting using a cast stents of her iliac arteries by Dr. Judithann Sauger 07/27/13 resulted in complete resolution of her claudication symptoms and normalization of her Dopplers.   She had GI bleeding in 2016 taking aspirin and the Plavix after stents placed in lower extremities. Had a GI consult, required 3 units of blood transfusion. GI workup showed bleeding ulcer in the stomach. She was told not to take any Plavix anymore. She is on aspirin 81 currently. Follow up with GI later was told ulcer was healed.  11/22/15 follow up - patient had followed up with Dr. Trula Slade and had cerebral angiogram as well as left subclavian artery stenting due to high-grade stenosis with subclavian steal syndrome. Patient tolerating with the procedure pretty well. BP check at home much better at 120-140s, today in clinic 100/69. However, patient continue to complain of neck pain, radiating headache to bilateral temporal and vertex, and lightheadedness. Found to have bilateral occipital neuralgia.   03/26/16 follow up - pt has been doing well from neuro standpoint. She had occipital nerve blocks in 11/2015 and 12/2015, and since then no more occipital neuralgia. She followed with [facility] Dr. Trula Slade in 12/2015 and left ICA 60-79% stenosis but VA all patent. She had left upper lobe lung resection for lung cancer treatment in 01/2016 without chemo and radiation. However, she continued to  have left chest pain post op and currently under treatment with pain management. She is only on cardizam and metoprolol now and BP stable. Today 131/78.  06/19/16 follow up - Pt came in again complaining of headache and dizziness. He followed with oncology for lung caner and planned to repeat CT chest in 08/2016 and PET scan some time. She also followed with Dr. Gwenlyn Found and PCP. Does not remember when HA  restarted but similar as before. She was also referred to ENT Dr.Shoemaker for vertigo but the vertigo she describes is more consistent with orthostasis. BP 129/71 today.   10/23/16 follow up - patient has been doing well. She had radiofrequency ablation at bilateral occipital nerves by Dr. Luan Pulling. Since the procedure patient felt headache much relieved, and she is very happy about that. She also had BPPV maneuver treatments just once and her vertigo is also gone. She still complains of mild frontal headache, but likely due to allergy and not bothering her. BP today 100/63, asymptomatic.  06/03/17 follow up - In 03/2017, she again started to have headache at the back of the head, consistent with recurrent occipital neuralgia.  She went to see Dr. Luan Pulling in pain management, again had radioablation bilaterally which relieved the pain.  Since then no headache so far.  In 04/2017, patient started to have code with coughing and sputum, no fever.  She still has mild symptoms of those right now.  However, patient stated that almost the same time she started to have throbbing, achy pain in both arms and legs, feels like Jell-O or water in all extremities.  This feedings lasting all day long, walking, or using arms makes symptoms slightly worse, sitting or resting makes symptoms slightly better.  However, symptoms start as soon as she wakes up from sleep. Her symptoms gradually getting worse over time.  She started using walker for walking due to feeling of knee buckling after walking.  She also complains of intermittent stiffness of both legs on sitting, she has to standing up from time to time to relieve the stiffness feeling.  During this encounter, she also had sharp radiating pain from right knee to the right foot, which has not happened before.  She stated that she had bilateral LE stent for PVD, however, this feeling is totally different from previous vascular disease.  She denies any headache, nausea vomiting,  dizziness, bowel or bladder dysfunction, diplopia, proptosis or fall.  She had seen her PCP for the throbbing pain and weakness feeling, and referred here for further evaluation.  Interval history: During the interval time, patient has been doing better. Work up for her limb proximal weakness including ESR, CRP, CK, ANA, aldolase, AchR abs, EMG/NCS all negative. Only A1C was 8.1. Planned to do MRI C-spine, then her symptoms gradually resolved. Therefore, MRI C-spine cancelled. Today pt in clinic, walking without walker, she has no limb weakness complains.   Only complain she had today was 3 mild back of head pain for the last 2 weeks, mild 2/10, lasting 2-3 hours, no n/v, photo- or phonophobia. Similar to her previous occipital neuralgia. She had 2nd occipital nerver radioablation with Dr. Luan Pulling in 04/2017. She took hydrocodone 49m for that and it helped some. BP today 139/70 and pt glucose controlled well at home after increase metformin dose.    Past Medical History:  Diagnosis Date  . Anxiety   . [place] artery disease ([facility]   . Claudication ([facility] [place]   . COPD (chronic obstructive pulmonary disease) ([facility]   .  Diabetes mellitus   . Dizzy   . GERD (gastroesophageal reflux disease)   . Headache   . History of kidney stones   . Hypercholesteremia   . Hypertension   . 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 Shortness of breath    with exertion  . Stomach ulcer   . Tobacco abuse    Past Surgical History:  Procedure Laterality Date  . ABDOMINAL HYSTERECTOMY    . APPENDECTOMY    . BREAST REDUCTION SURGERY    . CAROTID ANGIOGRAM N/A 02/25/2013   Procedure: CAROTID ANGIOGRAM;  Surgeon: Lorretta Harp, MD;  Location: [facility] CATH LAB;  Service: Cardiovascular;  Laterality: N/A;  . ENDARTERECTOMY Right 03/06/2013   Procedure: ENDARTERECTOMY CAROTID-RIGHT;  Surgeon: Serafina Mitchell, MD;  Location: [place];  Service: Vascular;  Laterality: Right;  .  ENDARTERECTOMY Left 05/07/2013   Procedure: LEFT CAROTID ARTERY ENDARTERECTOMY WITH VASCU-GUARD PATCH ANGIOPLASTY ;  Surgeon: Serafina Mitchell, MD;  Location: [place];  Service: Vascular;  Laterality: Left;  . LOBECTOMY Left 02/03/2016   Procedure: LEFT UPPER LOBECTOMY;  Surgeon: [name], MD;  Location: [place];  Service: Thoracic;  Laterality: Left;  . LOWER EXTREMITY ANGIOGRAM N/A 02/25/2013   Procedure: LOWER EXTREMITY ANGIOGRAM;  Surgeon: Lorretta Harp, MD;  Location: [facility] CATH LAB;  Service: Cardiovascular;  Laterality: N/A;  . LOWER EXTREMITY ANGIOGRAM N/A 07/27/2013   Procedure: LOWER EXTREMITY ANGIOGRAM;  Surgeon: Lorretta Harp, MD;  Location: [facility] CATH LAB;  Service: Cardiovascular;  Laterality: N/A;  . PATCH ANGIOPLASTY Right 03/06/2013   Procedure: PATCH ANGIOPLASTY of Right Carotid Artery using Vascu-Guard Patch;  Surgeon: Serafina Mitchell, MD;  Location: [place];  Service: Vascular;  Laterality: Right;  . PERIPHERAL VASCULAR CATHETERIZATION N/A 11/15/2015   Procedure: Aortic Arch Angiography;  Surgeon: Serafina Mitchell, MD;  Location: [place] CV LAB;  Service: Cardiovascular;  Laterality: N/A;  . PERIPHERAL VASCULAR CATHETERIZATION Bilateral 11/15/2015   Procedure: Carotid Angiography;  Surgeon: Serafina Mitchell, MD;  Location: [place] CV LAB;  Service: Cardiovascular;  Laterality: Bilateral;  . PERIPHERAL VASCULAR CATHETERIZATION Left 11/15/2015   Procedure: Upper Extremity Angiography;  Surgeon: Serafina Mitchell, MD;  Location: [place] CV LAB;  Service: Cardiovascular;  Laterality: Left;  . PERIPHERAL VASCULAR CATHETERIZATION Left 11/15/2015   Procedure: Peripheral Vascular Intervention;  Surgeon: Serafina Mitchell, MD;  Location: [place] CV LAB;  Service: Cardiovascular;  Laterality: Left;  subclavian   . SALIVARY GLAND SURGERY     scar tissue removed from left saliva glad  . TUBAL LIGATION    . VIDEO ASSISTED THORACOSCOPY (VATS)/WEDGE RESECTION Left 02/03/2016    Procedure: VIDEO ASSISTED THORACOSCOPY;  Surgeon: [name], MD;  Location: [place];  Service: Thoracic;  Laterality: Left;   Family History  Problem Relation Age of Onset  . Stroke Mother   . Hypertension Mother   . Kidney failure Father    Current Outpatient Medications  Medication Sig Dispense Refill  . albuterol (PROVENTIL HFA;VENTOLIN HFA) 108 (90 Base) MCG/ACT inhaler Inhale into the lungs.    [name] aspirin EC 81 MG tablet Take 81 mg by mouth daily.    [name] BIOTIN 5000 PO Take by mouth.    . calcium carbonate (CALCIUM 600) 600 MG TABS tablet Take by mouth.    . diazepam (VALIUM) 5 MG tablet Take 5 mg by mouth 2 (two) times daily. Anxiety    . diltiazem (DILACOR  XR) 180 MG 24 hr capsule Take 180 mg by mouth.    . fish oil-omega-3 fatty acids 1000 MG capsule Take 1 g by mouth daily.     [name] HYDROcodone-acetaminophen (NORCO/VICODIN) 5-325 MG tablet Take 1 tablet by mouth every 6 (six) hours as needed for moderate pain.    . Magnesium Gluconate 550 MG TABS Take 250 mg by mouth.     . metFORMIN (GLUCOPHAGE) 500 MG tablet Take 500 mg by mouth 2 (two) times daily with a meal.     . metoprolol tartrate (LOPRESSOR) 25 MG tablet Take 0.5 tablets (12.5 mg total) by mouth 2 (two) times daily. (Patient taking differently: Take 12.5 mg by mouth 2 (two) times daily. ) 30 tablet 1  . naphazoline-glycerin (CLEAR EYES) 0.012-0.2 % SOLN Place 1-2 drops into both eyes 4 (four) times daily as needed (for itchy eyes).    . ONE TOUCH ULTRA TEST test strip     . pantoprazole (PROTONIX) 40 MG tablet Take 40 mg by mouth 2 (two) times daily.     . pravastatin (PRAVACHOL) 40 MG tablet Take 40 mg by mouth daily.    . cyclobenzaprine (FLEXERIL) 5 MG tablet Take 1 tablet (5 mg total) by mouth 3 (three) times daily as needed for muscle spasms. 30 tablet 0   No current facility-administered medications for this visit.    Allergies  Allergen Reactions  . No Known Allergies Other (See Comments)   Social History     Socioeconomic History  . Marital status: Widowed    Spouse name: Not on file  . Number of children: Not on file  . Years of education: Not on file  . Highest education level: Not on file  Occupational History  . Not on file  Social Needs  . Financial resource strain: Not on file  . Food insecurity:    Worry: Not on file    Inability: Not on file  . Transportation needs:    Medical: Not on file    Non-medical: Not on file  Tobacco Use  . Smoking status: Former Smoker    Packs/day: 1.50    Years: 56.00    Pack years: 84.00    Types: Cigarettes    Last attempt to quit: 07/07/2013    Years since quitting: 4.0  . Smokeless tobacco: Former Systems developer    Quit date: 02/25/2013  Substance and Sexual Activity  . Alcohol use: No  . Drug use: No  . Sexual activity: Never  Lifestyle  . Physical activity:    Days per week: Not on file    Minutes per session: Not on file  . Stress: Not on file  Relationships  . Social connections:    Talks on phone: Not on file    Gets together: Not on file    Attends religious service: Not on file    Active member of club or organization: Not on file    Attends meetings of clubs or organizations: Not on file    Relationship status: Not on file  . Intimate partner violence:    Fear of current or ex partner: Not on file    Emotionally abused: Not on file    Physically abused: Not on file    Forced sexual activity: Not on file  Other Topics Concern  . Not on file  Social History Narrative  . Not on file    Review of Systems Full 14 system review of systems performed and notable only for those listed,  all others are neg:  Constitutional:   Cardiovascular:  Ear/Nose/Throat:   Skin:  Eyes:  Eye itching Respiratory:  Wheezing, SOB Gastroitestinal:   Genitourinary:  Hematology/Lymphatic:   Endocrine:  Musculoskeletal:  Walking difficulty, neck pain Allergy/Immunology:   Neurological:  Headache Psychiatric: depression, nervous,  anxiety Sleep:    Physical Exam  Vitals:   07/16/17 1501  BP: 139/70  Pulse: 69    General - Well nourished, well developed, in no apparent distress.  Ophthalmologic - fundi not visualized due to photophobia.  Cardiovascular - Regular rate and rhythm with no murmur.    Mental Status -  Level of arousal and orientation to time, place, and person were intact. Language including expression, naming, repetition, comprehension, reading, and writing was assessed and found intact. Fund of Knowledge was assessed and was intact.  Cranial Nerves II - XII - II - Visual field intact OU. III, IV, VI - Extraocular movements intact. V - Facial sensation intact bilaterally. VII - left nasolabial fold flattening, this is her baseline as per patient. VIII - Hearing & vestibular intact bilaterally, no nystagmus. X - Palate elevates symmetrically. XI - Chin turning & shoulder shrug intact bilaterally. XII - Tongue protrusion intact.  Motor Strength - The patient's strength was normal in all extremities and pronator drift was absent. Bulk was normal and fasciculations were absent.   Motor Tone - Muscle tone was assessed at the neck and appendages and was normal.  Reflexes - The patient's reflexes were 1+ BUEs, 2+ right petaller and 1+ left petaller, and diminished b/l ankle reflexes and she had no pathological reflexes.  Sensory - Light touch, temperature/pinprick were assessed and were normal.    Coordination - The patient had normal movements in the hands with no ataxia or dysmetria.  Tremor was absent.  Gait and Station - walk without walker, slow with small stride, but steady.   Imaging  I have personally reviewed the radiological images below and agree with the radiology interpretations.  CT HEAD Prominent small vessel disease changes without CT evidence of large acute infarct. Small or medium size infarct would be difficult to exclude given the degree white matter changes. Global  atrophy without hydrocephalus.  CTA NECK 3 vessel arch with calcified plaque. Aortic atherosclerosis. Plaque with moderate to marked narrowing proximal left subclavian artery, moderate narrowing proximal innominate and proximal left common carotid artery. Moderate narrowing proximal right subclavian artery. Moderate to mark narrowing proximal right common carotid artery. Post right carotid endarterectomy. Plaque with mild narrowing distal right common carotid artery. No significant stenosis right carotid bifurcation. Plaque distal right internal carotid artery with moderate to marked narrowing proximal to the skullbase. Moderate narrowing origin of the left common carotid artery. Plaque with mild to moderate narrowing mid to distal left common carotid artery. Plaque with 76% diameter stenosis proximal left internal carotid artery. Ectatic proximal vertebral arteries without high-grade stenosis. Several thyroid lesions measuring up to 3.1 cm. Thyroid ultrasound recommended for further delineation.  CTA HEAD  Calcified plaque with mild to slightly mild narrowing cavernous segment internal carotid artery bilaterally. Fetal contribution to the posterior cerebral artery bilaterally. Mild narrowing distal vertebral arteries. Mild narrowing basilar artery. Mild narrowing portions of the posterior cerebral arteries bilaterally.  12/26/15 CUS -  60-79% left recurrent carotid stenosis.  An 1-39 percent right carotid stenosis.  Both vertebral arteries are antegrade  06/24/17 EMG/NCS Nerve conduction studies done on the left upper and left lower extremities were within normal limits.  No evidence of  a peripheral neuropathy was seen.  EMG evaluation of the left upper and left lower extremities were relatively unremarkable without evidence of an overlying radiculopathy or a myopathic disorder.  The repetitive nerve stimulation study done on the left shoulder was unremarkable, no clear evidence of a  neuromuscular transmission disorder was seen.  Lab Review 06/21/15 LDL 75 and TG 217  Component     Latest Ref Rng & Units 06/03/2017  AChR Binding Ab, Serum     0.00 - 0.24 nmol/L <0.03  Acetylchol Block Ab     0 - 25 % 21  Acetylcholine Modulat Ab     0 - 20 % <12  Hemoglobin A1C     4.8 - 5.6 % 8.1 (H)  Est. average glucose Bld gHb Est-mCnc     mg/dL 186  Sed Rate     0 - 40 mm/hr 19  CRP     0.0 - 4.9 mg/L 2.9  CK Total     24 - 173 U/L 46  Anit Nuclear Antibody(ANA)     Negative Negative  Aldolase     3.3 - 10.3 U/L 4.5      Assessment:   In summary, ETRULIA ZARR is a 77 y.o. female with PMH of HTN, HLD, PVD s/p stents in LEs, carotid stenosis s/p b/l CEAs had episode of transient double vision and vertigo on 10/12/15 with neck movement. Continue to have lightheadedness and posterior headache. Her recent CUS indicating progressive left ICA re-stenosis and left VA retrograde flow. Recent CTA head and neck showed left ICA 75% stenosis, right ICA distal stenosis, and left subclavian artery origin severe stenosis. Taken together, it is consistent with subclavian steal syndrome. In addition, patient has worsening symptoms with neck moment, need to consider bow hunter syndrome. Patient BP low at home, today 92/56, likely BP medication related. She needs higher BP goal for any great cerebral perfusion due to multiple vessel stenosis. Will discontinue lisinopril and HCTZ. Had urgent [facility] consultation and received left subclavian artery stenting. Continue to follow up with [facility] and repeat CUS showed left ICA 60-79% stenosis. MRI brain not done due to metal stent in body. BP improved after medication adjustment. However, patient continues to have bilateral occipital neuralgia. Received occipital nerve block x 2 and free of occipital neuralgia since the second shot. She had left lung cancer surgery in 01/2016 and underwent pain management for left chest pain. 06/2016 again for occipital  neuralgia bilaterally, occipital nerve block did not help. Had occipital nerve radiofrequency ablation by pain management, and HA resolved. Also had BPPV maneuver treatment and vertigo is gone. She had recurrent bilateral occipital neuralgia, status post again occipital nerve radiofrequency ablation in 04/2017, headache resolved.  However, since 04/2017, at the same time of a cold, she developed a throbbing, achy pain, weakness feeling in all extremities, proximal > distal, not too much change throughout the day, no weakness on exam, no hyperreflexia. I am concerning for polymyalgia rheumatica but DDx also include polymyositis, MG, CIDP, PAD ,and cervical myelopathy. However, no significant weakness, no fluctuating during the day, no hyperreflexia makes the other DDx less likely. Work up including ESR, CRP, CK, ANA, aldolase, AchR abs, EMG/NCS all negative. Only A1C was 8.1. Planned to do MRI C-spine, then her symptoms gradually resolved. Therefore, MRI C-spine cancelled. Now she back to baseline.    She had today was 3 mild back of head pain for the last 2 weeks, mild 2/10, similar to her previous occipital neuralgia.  Will order flexiril PRN.     Plan: - continue ASA and pravastatin for stroke prevention - check BP and glucose at home and record  - follow up with Dr. Trula Slade and Dr. Gwenlyn Found for carotid stenosis.  - Follow up with your primary care physician for stroke risk factor modification. Recommend maintain blood pressure goal 130-150/80, diabetes with hemoglobin A1c goal below 7.0% and lipids with LDL cholesterol goal below 70 mg/dL.  - follow up with Dr. Luan Pulling for pain management - will order flexeril as needed to help neck/head pain and neck stiffness.  - follow up as needed.   I spent more than 25 minutes of face to face time with the patient. Greater than 50% of time was spent in counseling and coordination of care. We discussed occipital neuralgia recurrence, reviewed all the work up since  last visit.     No orders of the defined types were placed in this encounter.   Meds ordered this encounter  Medications  . cyclobenzaprine (FLEXERIL) 5 MG tablet    Sig: Take 1 tablet (5 mg total) by mouth 3 (three) times daily as needed for muscle spasms.    Dispense:  30 tablet    Refill:  0    Patient Instructions  - continue ASA and pravastatin for stroke prevention - check BP and glucose at home and record  - follow up with Dr. Trula Slade and Dr. Gwenlyn Found for carotid stenosis.  - Follow up with your primary care physician for stroke risk factor modification. Recommend maintain blood pressure goal 130-150/80, diabetes with hemoglobin A1c goal below 7.0% and lipids with LDL cholesterol goal below 70 mg/dL.  - follow up with Dr. Luan Pulling for pain management - will order flexeril as needed to help neck/head pain and neck stiffness. If you had neck/back of head pain, take 1 tab three times a day for 2 days to help.  - follow up as needed.     Rosalin Hawking, MD PhD [facility] [address], [place], [place] [address] [phone]

## 2017-07-16 NOTE — Patient Instructions (Addendum)
-  continue ASA and pravastatin for stroke prevention - check BP and glucose at home and record  - follow up with Dr. Trula Slade and Dr. Gwenlyn Found for carotid stenosis.  - Follow up with your primary care physician for stroke risk factor modification. Recommend maintain blood pressure goal 130-150/80, diabetes with hemoglobin A1c goal below 7.0% and lipids with LDL cholesterol goal below 70 mg/dL.  - follow up with Dr. Luan Pulling for pain management - will order flexeril as needed to help neck/head pain and neck stiffness. If you had neck/back of head pain, take 1 tab three times a day for 2 days to help.  - follow up as needed.

## 2017-08-02 ENCOUNTER — Telehealth: Payer: Self-pay | Admitting: Oncology

## 2017-08-02 ENCOUNTER — Telehealth: Payer: Self-pay | Admitting: Internal Medicine

## 2017-08-02 NOTE — Telephone Encounter (Signed)
Patient called in to schedule a ct scan I did provide the number

## 2017-08-02 NOTE — Telephone Encounter (Signed)
Called pt re appt being moved due to MM PAL - spoke w/ pt re appts.

## 2017-08-16 ENCOUNTER — Telehealth: Payer: Self-pay | Admitting: Internal Medicine

## 2017-08-16 NOTE — Telephone Encounter (Signed)
Per 5/2 sch message - pt wanted ct on same date as f/u did not r.s labs - patient to contact central radiology.

## 2017-09-04 ENCOUNTER — Telehealth: Payer: Self-pay | Admitting: Neurology

## 2017-09-04 NOTE — Telephone Encounter (Signed)
Spoke to patient - she is agreeable to the cervical MRI and is aware to expect a call for scheduling.  She is aware her care is transferring to Dr.Yan and a follow up appt has been made on 09/23/17.

## 2017-09-04 NOTE — Telephone Encounter (Signed)
Spoke to patient - her bilateral leg weakness is an ongoing issue with some days being worse than others.  There was a cervical MRI ordered but never completed.  Dr. Krista Blue has reviewed the patient's chart.  She would like her to proceed with the the cervical MRI and then follow up in the office with her.

## 2017-09-04 NOTE — Telephone Encounter (Signed)
Pt has called to inform that although she is not having pain she is having weakness in her legs causing her to barely be able to stand.  Pt is asking if she should schedule to see her family doctor or come here to be seen.  Please call

## 2017-09-04 NOTE — Telephone Encounter (Signed)
Noted resent order to [facility] they will reach out to the pt to schedule.

## 2017-09-06 ENCOUNTER — Ambulatory Visit
Admission: RE | Admit: 2017-09-06 | Discharge: 2017-09-06 | Disposition: A | Discharge status: Home / Self Care | Payer: PPO | Source: Ambulatory Visit | Attending: Neurology | Admitting: Neurology

## 2017-09-06 DIAGNOSIS — M542 Cervicalgia: Secondary | ICD-10-CM

## 2017-09-06 DIAGNOSIS — M4802 Spinal stenosis, cervical region: Secondary | ICD-10-CM | POA: Diagnosis not present

## 2017-09-06 DIAGNOSIS — M50222 Other cervical disc displacement at C5-C6 level: Secondary | ICD-10-CM | POA: Diagnosis not present

## 2017-09-06 MED ORDER — GADOBENATE DIMEGLUMINE 529 MG/ML IV SOLN
20.0000 mL | Freq: Once | INTRAVENOUS | Status: AC | PRN
  Administered 2017-09-06: 20 mL via INTRAVENOUS

## 2017-09-10 ENCOUNTER — Ambulatory Visit (HOSPITAL_COMMUNITY): Admission: RE | Admit: 2017-09-10 | Payer: PPO | Source: Ambulatory Visit

## 2017-09-10 ENCOUNTER — Telehealth: Payer: Self-pay

## 2017-09-10 ENCOUNTER — Ambulatory Visit: Payer: PPO | Admitting: Internal Medicine

## 2017-09-10 ENCOUNTER — Other Ambulatory Visit: Payer: PPO

## 2017-09-10 NOTE — Telephone Encounter (Signed)
-----  Message from Rosalin Hawking, MD sent at 09/06/2017  6:57 PM EDT ----- Could you please let the patient know that the cervical spine MRI test done recently was not impressive, very mild arthritis and not remarkable. Since her symptoms of whole body weakness resolved, will not need any further test.  Please continue current treatment. Thanks.  Rosalin Hawking, MD PhD Stroke Neurology 09/06/2017 6:57 PM

## 2017-09-10 NOTE — Telephone Encounter (Signed)
Pt will see Dr. Krista Blue on 09/23/17 for evaluation. This is a very good arrangement. Ask pt not to miss this important appointment. Thanks.   Rosalin Hawking, MD PhD Stroke Neurology 09/10/2017 5:34 PM

## 2017-09-10 NOTE — Telephone Encounter (Signed)
revised

## 2017-09-10 NOTE — Telephone Encounter (Signed)
Rn call patient about MRI cervical spine test. Rn stated it showed very mild arthritis and not remarkable. Since her symptoms of whole body weakness resolved, will not need any further test. Please continue current treatment. Thanks. PT verbalized understanding. ------

## 2017-09-10 NOTE — Telephone Encounter (Addendum)
RN was calling patient about her MRI cervical spine results. While on the phone pt stated " wow everything look okay bedside mild arthritis", my symptoms are back'. Pt reported the pain, weakness is all 4 extremities has come back. Rn stated she has had EMG on 4 extremities, and it was normal.Pt stated she was fine at last visit no issues.  PT stated she wants answers. RN stated a message will be sent to Dr.Xu.

## 2017-09-10 NOTE — Telephone Encounter (Addendum)
RN call patient that she already has appt with Dr. Krista Blue on 09/23/2017 for weakness in arms for a consult. PT stated she has the appt already written down. Rn stated per 09/04/2017 she call complaining about the weakness and was already schedule. Rn advised pt to keep appt with Dr. Krista Blue. Pt verbalized understanding.

## 2017-09-11 ENCOUNTER — Ambulatory Visit: Payer: PPO | Admitting: Internal Medicine

## 2017-09-12 ENCOUNTER — Telehealth: Payer: Self-pay | Admitting: Oncology

## 2017-09-12 ENCOUNTER — Inpatient Hospital Stay: Payer: PPO | Attending: Oncology | Admitting: Oncology

## 2017-09-12 ENCOUNTER — Ambulatory Visit (HOSPITAL_COMMUNITY)
Admission: RE | Admit: 2017-09-12 | Discharge: 2017-09-12 | Disposition: A | Discharge status: Home / Self Care | Payer: PPO | Source: Ambulatory Visit | Attending: Internal Medicine | Admitting: Internal Medicine

## 2017-09-12 ENCOUNTER — Encounter: Payer: Self-pay | Admitting: Oncology

## 2017-09-12 ENCOUNTER — Inpatient Hospital Stay: Payer: PPO

## 2017-09-12 VITALS — BP 137/88 | HR 86 | Temp 98.4°F | Resp 18 | Ht 64.0 in | Wt 189.0 lb

## 2017-09-12 DIAGNOSIS — Z85118 Personal history of other malignant neoplasm of bronchus and lung: Secondary | ICD-10-CM | POA: Insufficient documentation

## 2017-09-12 DIAGNOSIS — I700 Atherosclerosis of aorta: Secondary | ICD-10-CM | POA: Insufficient documentation

## 2017-09-12 DIAGNOSIS — C3490 Malignant neoplasm of unspecified part of unspecified bronchus or lung: Secondary | ICD-10-CM

## 2017-09-12 DIAGNOSIS — I2510 Atherosclerotic heart disease of native coronary artery without angina pectoris: Secondary | ICD-10-CM | POA: Insufficient documentation

## 2017-09-12 DIAGNOSIS — J432 Centrilobular emphysema: Secondary | ICD-10-CM | POA: Diagnosis not present

## 2017-09-12 DIAGNOSIS — K760 Fatty (change of) liver, not elsewhere classified: Secondary | ICD-10-CM | POA: Insufficient documentation

## 2017-09-12 DIAGNOSIS — E041 Nontoxic single thyroid nodule: Secondary | ICD-10-CM | POA: Insufficient documentation

## 2017-09-12 DIAGNOSIS — C3432 Malignant neoplasm of lower lobe, left bronchus or lung: Secondary | ICD-10-CM

## 2017-09-12 LAB — CBC WITH DIFFERENTIAL/PLATELET
Basophils Absolute: 0 10*3/uL (ref 0.0–0.1)
Basophils Relative: 0 %
EOS PCT: 4 %
Eosinophils Absolute: 0.3 10*3/uL (ref 0.0–0.5)
HEMATOCRIT: 47.2 % — AB (ref 34.8–46.6)
HEMOGLOBIN: 15.3 g/dL (ref 11.6–15.9)
LYMPHS ABS: 1.6 10*3/uL (ref 0.9–3.3)
LYMPHS PCT: 19 %
MCH: 30.5 pg (ref 25.1–34.0)
MCHC: 32.4 g/dL (ref 31.5–36.0)
MCV: 94.2 fL (ref 79.5–101.0)
Monocytes Absolute: 0.7 10*3/uL (ref 0.1–0.9)
Monocytes Relative: 8 %
NEUTROS ABS: 6 10*3/uL (ref 1.5–6.5)
NEUTROS PCT: 69 %
PLATELETS: 237 10*3/uL (ref 145–400)
RBC: 5.01 MIL/uL (ref 3.70–5.45)
RDW: 12.7 % (ref 11.2–14.5)
WBC: 8.6 10*3/uL (ref 3.9–10.3)

## 2017-09-12 LAB — COMPREHENSIVE METABOLIC PANEL
ALK PHOS: 66 U/L (ref 40–150)
ALT: 28 U/L (ref 0–55)
AST: 26 U/L (ref 5–34)
Albumin: 4.1 g/dL (ref 3.5–5.0)
Anion gap: 7 (ref 3–11)
BILIRUBIN TOTAL: 0.4 mg/dL (ref 0.2–1.2)
BUN: 24 mg/dL (ref 7–26)
CALCIUM: 9.4 mg/dL (ref 8.4–10.4)
CHLORIDE: 100 mmol/L (ref 98–109)
CO2: 32 mmol/L — ABNORMAL HIGH (ref 22–29)
CREATININE: 1 mg/dL (ref 0.60–1.10)
GFR calc Af Amer: >60 mL/min (ref >60)
GFR, EST NON AFRICAN AMERICAN: 53 mL/min — AB (ref >60)
Glucose, Bld: 98 mg/dL (ref 70–140)
Potassium: 5.4 mmol/L — ABNORMAL HIGH (ref 3.5–5.1)
Sodium: 139 mmol/L (ref 136–145)
Total Protein: 7.2 g/dL (ref 6.4–8.3)

## 2017-09-12 MED ORDER — IOHEXOL 300 MG/ML  SOLN
75.0000 mL | Freq: Once | INTRAMUSCULAR | Status: AC | PRN
  Administered 2017-09-12: 75 mL via INTRAVENOUS

## 2017-09-12 NOTE — Assessment & Plan Note (Addendum)
This is a very pleasant 77 year old white female with a stage IA non-small cell lung cancer, squamous cell carcinoma status post left lower lobectomy with lymph node dissection. The patient is doing fine today with no specific complaints. She had a repeat CT scan of the chest performed earlier today and is here to discuss the results.  The patient was seen with Dr. Julien Nordmann.  CT scan results were discussed with patient which showed no evidence of recurrent disease.  Recommend that she continue observation. She will have a CT scan of the chest in 6 months and follow-up 1 to 2 days after the scan to discuss the results.  She was advised to call immediately if she has any concerning symptoms in the interval. The patient voices understanding of current disease status and treatment options and is in agreement with the current care plan. All questions were answered. The patient knows to call the clinic with any problems, questions or concerns. We can certainly see the patient much sooner if necessary.

## 2017-09-12 NOTE — Telephone Encounter (Signed)
Scheduled appt per 5/30 los - Gave patient AVS and calender per los.

## 2017-09-12 NOTE — Progress Notes (Signed)
[facility] OFFICE PROGRESS NOTE  [name], PA-C [address] [place] [address]  DIAGNOSIS:  Stage IA (T1a, N0, M0) non-small cell lung cancer, squamous cell carcinoma status post left lower lobectomy with lymph node sampling in October 2017.  PRIOR THERAPY: Status post left lower lobectomy with lymph node sampling in October 2017.  CURRENT THERAPY: Observation  INTERVAL HISTORY: Caitlin Oliver 77 y.o. female returns for routine follow-up visit by herself.  The patient is feeling fine today with no specific complaints.  Patient denies fevers and chills.  Denies chest pain, shortness breath, cough, hemoptysis.  Denies nausea, vomiting, constipation, diarrhea.  Denies recent weight loss or night sweats.  The patient had a CT scan of the chest performed earlier today and she is here for evaluation discussion of her scan results.  MEDICAL HISTORY: Past Medical History:  Diagnosis Date  . Anxiety   . Carotid artery disease ([place])   . Claudication ([place])   . COPD (chronic obstructive pulmonary disease) ([place])   . Diabetes mellitus   . Dizzy   . GERD (gastroesophageal reflux disease)   . Headache   . History of kidney stones   . Hypercholesteremia   . Hypertension   . 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 Shortness of breath    with exertion  . Stomach ulcer   . Tobacco abuse     ALLERGIES:  is allergic to no known allergies.  MEDICATIONS:  Current Outpatient Medications  Medication Sig Dispense Refill  . albuterol (PROVENTIL HFA;VENTOLIN HFA) 108 (90 Base) MCG/ACT inhaler Inhale into the lungs.    [name] aspirin EC 81 MG tablet Take 81 mg by mouth daily.    [name] BIOTIN 5000 PO Take by mouth.    . calcium carbonate (CALCIUM 600) 600 MG TABS tablet Take by mouth.    . cyclobenzaprine (FLEXERIL) 5 MG tablet Take 1 tablet (5 mg total) by mouth 3 (three) times daily as needed for muscle spasms. 30 tablet 0  .  diazepam (VALIUM) 5 MG tablet Take 5 mg by mouth 2 (two) times daily. Anxiety    . diltiazem (DILACOR XR) 180 MG 24 hr capsule Take 180 mg by mouth.    . fish oil-omega-3 fatty acids 1000 MG capsule Take 1 g by mouth daily.     [name] HYDROcodone-acetaminophen (NORCO/VICODIN) 5-325 MG tablet Take 1 tablet by mouth every 6 (six) hours as needed for moderate pain.    . Magnesium Gluconate 550 MG TABS Take 250 mg by mouth.     . metFORMIN (GLUCOPHAGE) 500 MG tablet Take 500 mg by mouth 2 (two) times daily with a meal.     . metoprolol succinate (TOPROL-XL) 25 MG 24 hr tablet Take 37.5 mg by mouth 2 (two) times daily after a meal.    . naphazoline-glycerin (CLEAR EYES) 0.012-0.2 % SOLN Place 1-2 drops into both eyes 4 (four) times daily as needed (for itchy eyes).    . ONE TOUCH ULTRA TEST test strip     . pantoprazole (PROTONIX) 40 MG tablet Take 40 mg by mouth 2 (two) times daily.     . pravastatin (PRAVACHOL) 40 MG tablet Take 40 mg by mouth daily.     No current facility-administered medications for this visit.     SURGICAL HISTORY:  Past Surgical History:  Procedure Laterality Date  . ABDOMINAL HYSTERECTOMY    . APPENDECTOMY    . BREAST  REDUCTION SURGERY    . CAROTID ANGIOGRAM N/A 02/25/2013   Procedure: CAROTID ANGIOGRAM;  Surgeon: Lorretta Harp, MD;  Location: [facility] CATH LAB;  Service: Cardiovascular;  Laterality: N/A;  . ENDARTERECTOMY Right 03/06/2013   Procedure: ENDARTERECTOMY CAROTID-RIGHT;  Surgeon: Serafina Mitchell, MD;  Location: [facility];  Service: Vascular;  Laterality: Right;  . ENDARTERECTOMY Left 05/07/2013   Procedure: LEFT CAROTID ARTERY ENDARTERECTOMY WITH VASCU-GUARD PATCH ANGIOPLASTY ;  Surgeon: Serafina Mitchell, MD;  Location: [facility];  Service: Vascular;  Laterality: Left;  . LOBECTOMY Left 02/03/2016   Procedure: LEFT UPPER LOBECTOMY;  Surgeon: [name], MD;  Location: [place];  Service: Thoracic;  Laterality: Left;  . LOWER EXTREMITY ANGIOGRAM N/A 02/25/2013    Procedure: LOWER EXTREMITY ANGIOGRAM;  Surgeon: Lorretta Harp, MD;  Location: [facility] CATH LAB;  Service: Cardiovascular;  Laterality: N/A;  . LOWER EXTREMITY ANGIOGRAM N/A 07/27/2013   Procedure: LOWER EXTREMITY ANGIOGRAM;  Surgeon: Lorretta Harp, MD;  Location: [facility] CATH LAB;  Service: Cardiovascular;  Laterality: N/A;  . PATCH ANGIOPLASTY Right 03/06/2013   Procedure: PATCH ANGIOPLASTY of Right Carotid Artery using Vascu-Guard Patch;  Surgeon: Serafina Mitchell, MD;  Location: [place];  Service: Vascular;  Laterality: Right;  . PERIPHERAL VASCULAR CATHETERIZATION N/A 11/15/2015   Procedure: Aortic Arch Angiography;  Surgeon: Serafina Mitchell, MD;  Location: [facility] CV LAB;  Service: Cardiovascular;  Laterality: N/A;  . PERIPHERAL VASCULAR CATHETERIZATION Bilateral 11/15/2015   Procedure: Carotid Angiography;  Surgeon: Serafina Mitchell, MD;  Location: [facility] CV LAB;  Service: Cardiovascular;  Laterality: Bilateral;  . PERIPHERAL VASCULAR CATHETERIZATION Left 11/15/2015   Procedure: Upper Extremity Angiography;  Surgeon: Serafina Mitchell, MD;  Location: [place] CV LAB;  Service: Cardiovascular;  Laterality: Left;  . PERIPHERAL VASCULAR CATHETERIZATION Left 11/15/2015   Procedure: Peripheral Vascular Intervention;  Surgeon: Serafina Mitchell, MD;  Location: [facility] [facility] CV LAB;  Service: Cardiovascular;  Laterality: Left;  subclavian   . SALIVARY GLAND SURGERY     scar tissue removed from left saliva glad  . TUBAL LIGATION    . VIDEO ASSISTED THORACOSCOPY (VATS)/WEDGE RESECTION Left 02/03/2016   Procedure: VIDEO ASSISTED THORACOSCOPY;  Surgeon: [name], MD;  Location: [facility];  Service: Thoracic;  Laterality: Left;    REVIEW OF SYSTEMS:   Review of Systems  Constitutional: Negative for appetite change, chills, fatigue, fever and unexpected weight change.  HENT:   Negative for mouth sores, nosebleeds, sore throat and trouble swallowing.   Eyes: Negative for eye problems and icterus.   Respiratory: Negative for cough, hemoptysis, shortness of breath and wheezing.   Cardiovascular: Negative for chest pain and leg swelling.  Gastrointestinal: Negative for abdominal pain, constipation, diarrhea, nausea and vomiting.  Genitourinary: Negative for bladder incontinence, difficulty urinating, dysuria, frequency and hematuria.   Musculoskeletal: Negative for back pain, gait problem, neck pain and neck stiffness.  Skin: Negative for itching and rash.  Neurological: Negative for dizziness, extremity weakness, gait problem, headaches, light-headedness and seizures.  Hematological: Negative for adenopathy. Does not bruise/bleed easily.  Psychiatric/Behavioral: Negative for confusion, depression and sleep disturbance. The patient is not nervous/anxious.     PHYSICAL EXAMINATION:  Blood pressure 137/88, pulse 86, temperature 98.4 F (36.9 C), temperature source Oral, resp. rate 18, height _0  (1.626 m), weight 189 lb (85.7 kg), SpO2 95 %.  ECOG PERFORMANCE STATUS: 1 - Symptomatic but completely ambulatory  Physical Exam  Constitutional: Oriented to person, place, and time and well-developed,  well-nourished, and in no distress. No distress.  HENT:  Head: Normocephalic and atraumatic.  Mouth/Throat: Oropharynx is clear and moist. No oropharyngeal exudate.  Eyes: Conjunctivae are normal. Right eye exhibits no discharge. Left eye exhibits no discharge. No scleral icterus.  Neck: Normal range of motion. Neck supple.  Cardiovascular: Normal rate, regular rhythm, normal heart sounds and intact distal pulses.   Pulmonary/Chest: Effort normal and breath sounds normal. No respiratory distress. No wheezes. No rales.  Abdominal: Soft. Bowel sounds are normal. Exhibits no distension and no mass. There is no tenderness.  Musculoskeletal: Normal range of motion. Exhibits no edema.  Lymphadenopathy:    No cervical adenopathy.  Neurological: Alert and oriented to person, place, and time.  Exhibits normal muscle tone. Gait normal. Coordination normal.  Skin: Skin is warm and dry. No rash noted. Not diaphoretic. No erythema. No pallor.  Psychiatric: Mood, memory and judgment normal.  Vitals reviewed.  LABORATORY DATA: Lab Results  Component Value Date   WBC 8.6 09/12/2017   HGB 15.3 09/12/2017   HCT 47.2 (H) 09/12/2017   MCV 94.2 09/12/2017   PLT 237 09/12/2017      Chemistry      Component Value Date/Time   NA 139 09/12/2017 1152   NA 138 06/03/2017 1414   NA 138 03/12/2017 1121   K 5.4 (H) 09/12/2017 1152   K 4.6 03/12/2017 1121   CL 100 09/12/2017 1152   CO2 32 (H) 09/12/2017 1152   CO2 27 03/12/2017 1121   BUN 24 09/12/2017 1152   BUN 20 06/03/2017 1414   BUN 19.6 03/12/2017 1121   CREATININE 1.00 09/12/2017 1152   CREATININE 0.9 03/12/2017 1121      Component Value Date/Time   CALCIUM 9.4 09/12/2017 1152   CALCIUM 9.6 03/12/2017 1121   ALKPHOS 66 09/12/2017 1152   ALKPHOS 91 03/12/2017 1121   AST 26 09/12/2017 1152   AST 38 (H) 03/12/2017 1121   ALT 28 09/12/2017 1152   ALT 38 03/12/2017 1121   BILITOT 0.4 09/12/2017 1152   BILITOT 0.3 06/03/2017 1414   BILITOT 0.44 03/12/2017 1121       RADIOGRAPHIC STUDIES:  Ct Chest W Contrast  Result Date: 09/12/2017 CLINICAL DATA:  Followup left lung non-small-cell carcinoma. Previous left upper lobectomy. EXAM: CT CHEST WITH CONTRAST TECHNIQUE: Multidetector CT imaging of the chest was performed during intravenous contrast administration. CONTRAST:  70m OMNIPAQUE IOHEXOL 300 MG/ML  SOLN COMPARISON:  03/12/2017 FINDINGS: Cardiovascular: No acute findings. Aortic and coronary artery atherosclerosis. Mediastinum/Nodes: No pathologically enlarged lymph nodes identified. 1.7 cm left thyroid lobe nodule shows no significant change. Lungs/Pleura: Stable postop changes from left upper lobectomy. No suspicious pulmonary nodules or masses are identified. Mild centrilobular emphysema again noted. No evidence of  pulmonary infiltrate or pleural effusion. Upper Abdomen: Diffuse hepatic steatosis again noted. Normal adrenal glands. Musculoskeletal:  No suspicious bone lesions. IMPRESSION: Stable postop changes from left upper lobectomy. No evidence of recurrent or metastatic carcinoma within the thorax. Stable left thyroid nodule and hepatic steatosis. Aortic Atherosclerosis (ICD10-I70.0) and Emphysema (ICD10-J43.9). Coronary artery calcification. Electronically Signed   By: JEarle GellM.D.   On: 09/12/2017 15:30   Mr Cervical Spine W Wo Contrast  Result Date: 09/06/2017 CLINICAL DATA:  Neck pain and pain and weakness in both arms and legs since January 2019. [facility] at [facility] [address] Results: Creatinine 1.0 mg/dL. EXAM: MRI CERVICAL SPINE WITHOUT AND WITH CONTRAST TECHNIQUE: Multiplanar and multiecho  pulse sequences of the cervical spine, to include the craniocervical junction and cervicothoracic junction, were obtained without and with intravenous contrast. CONTRAST:  39m MULTIHANCE GADOBENATE DIMEGLUMINE 529 MG/ML IV SOLN COMPARISON:  CT scan of the neck dated 10/20/2015 and brain MR dated 07/11/2016 FINDINGS: Alignment: 1.7 mm anterolisthesis of C4 on C5. 1.8 mm retrolisthesis of C5 on C6. Vertebrae: Ankylosis of the left facet joint and of the vertebral bodies at C3-4. Cord: Normal signal and morphology. Posterior Fossa, vertebral arteries, paraspinal tissues: Patchy increased signal intensity from the pons, most likely representing small vessel ischemic disease, more prominent than on the prior brain MRI. Disc levels: C2-3: Tiny central disc bulge with no neural impingement. Moderate left facet arthritis. No foraminal stenosis. C3-4: No disc bulging or protrusion. Ankylosis of the left facet joint and of the vertebral bodies. Widely patent neural foramina. C4-5: Minimal anterolisthesis with minimal bilateral facet arthritis. Tiny central disc bulge with no neural  impingement. C5-6: Disc space narrowing. Small broad-based disc osteophyte complex without neural impingement. Foraminal stenosis. C6-7: Normal. C7-T1: Normal disc. Minimal degenerative changes of the left facet joint. IMPRESSION: 1. No acute abnormalities of the cervical spine. No neural impingement. Spinal cord compression or myelopathy. 2. 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 3. Chronic mild degenerative disc disease at C5-6 without neural impingement. 4. Chronic small vessel ischemic disease in the pons, progressed since the prior MRI. Electronically Signed   By: JLorriane ShireM.D.   On: 09/06/2017 16:36     ASSESSMENT/PLAN:  Cancer of left lung ([facility] This is a very pleasant 77year old white female with a stage IA non-small cell lung cancer, squamous cell carcinoma status post left lower lobectomy with lymph node dissection. The patient is doing fine today with no specific complaints. She had a repeat CT scan of the chest performed earlier today and is here to discuss the results.  The patient was seen with Dr. MJulien Nordmann  CT scan results were discussed with patient which showed no evidence of recurrent disease.  Recommend that she continue observation. She will have a CT scan of the chest in 6 months and follow-up 1 to 2 days after the scan to discuss the results.  She was advised to call immediately if she has any concerning symptoms in the interval. The patient voices understanding of current disease status and treatment options and is in agreement with the current care plan. All questions were answered. The patient knows to call the clinic with any problems, questions or concerns. We can certainly see the patient much sooner if necessary.   Orders Placed This Encounter  Procedures  . CT CHEST W CONTRAST    Standing Status:   Future    Standing Expiration Date:   09/13/2018    Order Specific Question:   If indicated for the ordered procedure, I authorize the administration of contrast  media per Radiology protocol    Answer:   Yes    Order Specific Question:   [facility]    [facility]   Order Specific Question:   Radiology Contrast Protocol - do NOT remove file path    Answer:   [email or web address]    Order Specific Question:   Reason for Exam additional comments    Answer:   lung cancer. restaging.  .Marland KitchenCBC with Differential (Cancer Center Only)    Standing Status:   Future    Standing Expiration Date:   09/13/2018  . CMP ([facility])    Standing Status:   Future    Standing Expiration Date:   09/13/2018   Mikey Bussing, DNP, AGPCNP-BC, AOCNP 09/12/17  ADDENDUM: Hematology/Oncology Attending: I had a face-to-face encounter with the patient.  I recommended her care plan.  This is a very pleasant 77 years old white female with a stage Ia non-small cell lung cancer, squamous cell carcinoma status post left lower lobectomy with lymph node dissection.  The patient has been in observation since October 2017 with no concerning findings for disease recurrence.  She had CT scan of the chest performed earlier today that showed no concerning findings. I discussed the scan results with the patient and recommended for her to continue on observation with repeat CT scan of the chest in 6 months. She was advised to call immediately if she has any concerning symptoms in the interval.  Disclaimer: This note was dictated with voice recognition software. Similar sounding words can inadvertently be transcribed and may be missed upon review. Eilleen Kempf, MD 09/13/17

## 2017-09-23 ENCOUNTER — Telehealth: Payer: Self-pay | Admitting: Neurology

## 2017-09-23 ENCOUNTER — Ambulatory Visit (INDEPENDENT_AMBULATORY_CARE_PROVIDER_SITE_OTHER): Payer: PPO | Admitting: Neurology

## 2017-09-23 ENCOUNTER — Encounter: Payer: Self-pay | Admitting: Neurology

## 2017-09-23 VITALS — BP 121/62 | HR 69 | Ht 64.0 in | Wt 188.5 lb

## 2017-09-23 DIAGNOSIS — K118 Other diseases of salivary glands: Secondary | ICD-10-CM | POA: Diagnosis not present

## 2017-09-23 DIAGNOSIS — G8929 Other chronic pain: Secondary | ICD-10-CM | POA: Insufficient documentation

## 2017-09-23 DIAGNOSIS — I6523 Occlusion and stenosis of bilateral carotid arteries: Secondary | ICD-10-CM | POA: Diagnosis not present

## 2017-09-23 DIAGNOSIS — R52 Pain, unspecified: Secondary | ICD-10-CM | POA: Insufficient documentation

## 2017-09-23 MED ORDER — DULOXETINE HCL 60 MG PO CPEP: 60.0000 mg | ORAL_CAPSULE | Freq: Every day | ORAL | 11 refills | Status: DC

## 2017-09-23 MED ORDER — DULOXETINE HCL 60 MG PO CPEP
60.0000 mg | ORAL_CAPSULE | Freq: Every day | ORAL | 11 refills | Status: DC
Start: 2017-09-23 — End: 2017-09-23

## 2017-09-23 NOTE — Telephone Encounter (Signed)
Patient states she will call us and schedule appointments as needed.

## 2017-09-23 NOTE — Progress Notes (Signed)
NEUROLOGY CLINIC FOLLOW UP NOTE  NAME: Caitlin Oliver DOB: [birth year]  HPI: Caitlin Oliver is a 77 y.o. female, follow up for Dr. Erlinda Hong.  She had past medical history of hypertension, hyperlipidemia, peripheral vascular disease,   s/p diamondback orbital rotational atherectomy, PTA and stenting using a cast stents of her iliac arteries by Dr. Judithann Sauger 07/27/13 resulted in complete resolution of her claudication symptoms and normalization of her Dopplers.  She had bilateral carotid artery stenosis in the past, status post bilateral carotid artery endarterectomy in 2014 6 weeks apart by Dr.Brabham. Followed with Dr. Gwenlyn Found and CUS on 09/01/14 showed left ICA 50-69% stenosis with left VA retrograde flow. She had recent CUS on 04/27/15 showed right ICA 1-39% stenosis but left ICA progressed to 60-79% stenosis with left VA retrograde flow.  She was also diagnosed with left subclavian steal syndrome, presented with sudden onset of dizziness, loss of balance, had left subclavian artery stenting by Dr.Braham in 2017,  She had GI bleeding in 2016 taking aspirin and the Plavix after stents placed in lower extremities, required 3 units of blood transfusion. GI workup showed bleeding ulcer in the stomach. She was told not to take any Plavix anymore. She is on aspirin 81 currently.    In 2017, she complains of persistent left-sided occipital pain, was diagnosed with left occipital neuralgia, had radiofrequency ablation of bilateral occipital nerve by Dr. Luan Pulling in 2018, which has helped her symptoms,  During her last visit with Dr. Erlinda Hong in February 2019, she began to complains of recurrent neck pain, from being achy pain in both arms and legs, weakness of 4 extremities intermittently, has to rely on her walker sometimes,  Work up for her limb proximal weakness including ESR, CRP, CK, ANA, aldolase, AchR abs, EMG/NCS all negative. Only A1C was 8.1.    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  Review of system: 14 system of review was performed, only relevant for as above.  Past Medical History:  Diagnosis Date  . Anxiety   . Carotid artery disease ([place])   . Claudication ([place])   . COPD (chronic obstructive pulmonary disease) ([place])   . Diabetes mellitus   . Dizzy   . GERD (gastroesophageal reflux disease)   . Headache   . History of kidney stones   . Hypercholesteremia   . Hypertension   . 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 Shortness of breath    with exertion  . Stomach ulcer   . Tobacco abuse    Past Surgical History:  Procedure Laterality Date  . ABDOMINAL HYSTERECTOMY    . APPENDECTOMY    . BREAST REDUCTION SURGERY    . CAROTID ANGIOGRAM N/A 02/25/2013   Procedure: CAROTID ANGIOGRAM;  Surgeon: Lorretta Harp, MD;  Location: [facility] CATH LAB;  Service: Cardiovascular;  Laterality: N/A;  . ENDARTERECTOMY Right 03/06/2013   Procedure: ENDARTERECTOMY CAROTID-RIGHT;  Surgeon: Serafina Mitchell, MD;  Location: [place];  Service:  Vascular;  Laterality: Right;  . ENDARTERECTOMY Left 05/07/2013   Procedure: LEFT CAROTID ARTERY ENDARTERECTOMY WITH VASCU-GUARD PATCH ANGIOPLASTY ;  Surgeon: Serafina Mitchell, MD;  Location: [place];  Service: Vascular;  Laterality: Left;  . LOBECTOMY Left 02/03/2016   Procedure: LEFT UPPER LOBECTOMY;  Surgeon: [name], MD;  Location: [place];  Service: Thoracic;  Laterality: Left;  . LOWER EXTREMITY ANGIOGRAM N/A 02/25/2013   Procedure: LOWER EXTREMITY  ANGIOGRAM;  Surgeon: Lorretta Harp, MD;  Location[facility] CATH LAB;  Service: Cardiovascular;  Laterality: N/A;  . LOWER EXTREMITY ANGIOGRAM N/A 07/27/2013   Procedure: LOWER EXTREMITY ANGIOGRAM;  Surgeon: Lorretta Harp, MD;  Location: [facility] CATH LAB;  Service: Cardiovascular;  Laterality: N/A;  . PATCH ANGIOPLASTY Right 03/06/2013   Procedure: PATCH ANGIOPLASTY of Right Carotid Artery using Vascu-Guard Patch;  Surgeon: Serafina Mitchell, MD;  Location: [place];  Service: Vascular;  Laterality: Right;  . PERIPHERAL VASCULAR CATHETERIZATION N/A 11/15/2015   Procedure: Aortic Arch Angiography;  Surgeon: Serafina Mitchell, MD;  Location: [place] CV LAB;  Service: Cardiovascular;  Laterality: N/A;  . PERIPHERAL VASCULAR CATHETERIZATION Bilateral 11/15/2015   Procedure: Carotid Angiography;  Surgeon: Serafina Mitchell, MD;  Location: [place] CV LAB;  Service: Cardiovascular;  Laterality: Bilateral;  . PERIPHERAL VASCULAR CATHETERIZATION Left 11/15/2015   Procedure: Upper Extremity Angiography;  Surgeon: Serafina Mitchell, MD;  Location: [place] CV LAB;  Service: Cardiovascular;  Laterality: Left;  . PERIPHERAL VASCULAR CATHETERIZATION Left 11/15/2015   Procedure: Peripheral Vascular Intervention;  Surgeon: Serafina Mitchell, MD;  Location: [place] CV LAB;  Service: Cardiovascular;  Laterality: Left;  subclavian   . SALIVARY GLAND SURGERY     scar tissue removed from left saliva glad  . TUBAL LIGATION    . VIDEO ASSISTED THORACOSCOPY (VATS)/WEDGE RESECTION Left 02/03/2016   Procedure: VIDEO ASSISTED THORACOSCOPY;  Surgeon: [name], MD;  Location: [place];  Service: Thoracic;  Laterality: Left;   Family History  Problem Relation Age of Onset  . Stroke Mother   . Hypertension Mother   . Kidney failure Father    Current Outpatient Medications  Medication Sig Dispense Refill  . albuterol (PROVENTIL HFA;VENTOLIN HFA) 108 (90 Base) MCG/ACT inhaler Inhale into the lungs.    [name] aspirin EC 81 MG  tablet Take 81 mg by mouth daily.    [name] BIOTIN 5000 PO Take by mouth.    . calcium carbonate (CALCIUM 600) 600 MG TABS tablet Take by mouth.    . cyclobenzaprine (FLEXERIL) 5 MG tablet Take 1 tablet (5 mg total) by mouth 3 (three) times daily as needed for muscle spasms. 30 tablet 0  . diazepam (VALIUM) 5 MG tablet Takes one tablet twice daily and one extra tablet, if needed.    . diltiazem (DILACOR XR) 180 MG 24 hr capsule Take 180 mg by mouth.    . fish oil-omega-3 fatty acids 1000 MG capsule Take 1 g by mouth daily.     [name] HYDROcodone-acetaminophen (NORCO/VICODIN) 5-325 MG tablet Take 1 tablet by mouth every 6 (six) hours as needed for moderate pain.    . Magnesium Gluconate 550 MG TABS Take 250 mg by mouth.     . metFORMIN (GLUCOPHAGE) 500 MG tablet Take 500 mg by mouth 2 (two) times daily with a meal.     . metoprolol succinate (TOPROL-XL) 25 MG 24 hr tablet Take 37.5 mg by mouth 2 (two) times daily after a meal.    .  naphazoline-glycerin (CLEAR EYES) 0.012-0.2 % SOLN Place 1-2 drops into both eyes 4 (four) times daily as needed (for itchy eyes).    . ONE TOUCH ULTRA TEST test strip     . pantoprazole (PROTONIX) 40 MG tablet Take 40 mg by mouth 2 (two) times daily.     . pravastatin (PRAVACHOL) 40 MG tablet Take 40 mg by mouth daily.     No current facility-administered medications for this visit.    Allergies  Allergen Reactions  . No Known Allergies Other (See Comments)   Social History   Socioeconomic History  . Marital status: Widowed    Spouse name: Not on file  . Number of children: Not on file  . Years of education: Not on file  . Highest education level: Not on file  Occupational History  . Not on file  Social Needs  . Financial resource strain: Not on file  . Food insecurity:    Worry: Not on file    Inability: Not on file  . Transportation needs:    Medical: Not on file    Non-medical: Not on file  Tobacco Use  . Smoking status: Former Smoker    Packs/day: 1.50      Years: 56.00    Pack years: 84.00    Types: Cigarettes    Last attempt to quit: 07/07/2013    Years since quitting: 4.2  . Smokeless tobacco: Former Systems developer    Quit date: 02/25/2013  Substance and Sexual Activity  . Alcohol use: No  . Drug use: No  . Sexual activity: Never  Lifestyle  . Physical activity:    Days per week: Not on file    Minutes per session: Not on file  . Stress: Not on file  Relationships  . Social connections:    Talks on phone: Not on file    Gets together: Not on file    Attends religious service: Not on file    Active member of club or organization: Not on file    Attends meetings of clubs or organizations: Not on file    Relationship status: Not on file  . Intimate partner violence:    Fear of current or ex partner: Not on file    Emotionally abused: Not on file    Physically abused: Not on file    Forced sexual activity: Not on file  Other Topics Concern  . Not on file  Social History Narrative  . Not on file    Review of Systems Full 14 system review of systems performed and notable only for those listed, all others are neg:  Constitutional:   Cardiovascular:  Ear/Nose/Throat:   Skin:  Eyes:  Eye itching Respiratory:  Wheezing, SOB Gastroitestinal:   Genitourinary:  Hematology/Lymphatic:   Endocrine:  Musculoskeletal:  Walking difficulty, neck pain Allergy/Immunology:   Neurological:  Headache Psychiatric: depression, nervous, anxiety Sleep:    Physical Exam  Vitals:   09/23/17 1055  BP: 121/62  Pulse: 69    PHYSICAL EXAMNIATION:  Gen: NAD, conversant, well nourised, obese, well groomed                     Cardiovascular: Regular rate rhythm, no peripheral edema, warm, nontender. Eyes: Conjunctivae clear without exudates or hemorrhage Neck: Supple, no carotid bruits. Pulmonary: Clear to auscultation bilaterally   NEUROLOGICAL EXAM:  MENTAL STATUS: Speech:    Speech is normal; fluent and spontaneous with normal  comprehension.  Cognition:     Orientation to  time, place and person     Normal recent and remote memory     Normal Attention span and concentration     Normal Language, naming, repeating,spontaneous speech     Fund of knowledge   CRANIAL NERVES: CN II: Visual fields are full to confrontation. Fundoscopic exam is normal with sharp discs and no vascular changes. Pupils are round equal and briskly reactive to light. CN III, IV, VI: extraocular movement are normal. No ptosis. CN V: Facial sensation is intact to pinprick in all 3 divisions bilaterally. Corneal responses are intact.  CN VII: Facial asymmetry, left parotid gland enlarged, tender upon deep palpitation CN VIII: Hearing is normal to rubbing fingers CN IX, X: Palate elevates symmetrically. Phonation is normal. CN XI: Head turning and shoulder shrug are intact CN XII: Tongue is midline with normal movements and no atrophy.  MOTOR: There is no pronator drift of out-stretched arms. Muscle bulk and tone are normal. Muscle strength is normal.  REFLEXES: Reflexes are 2+ and symmetric at the biceps, triceps, knees, and ankles. Plantar responses are flexor.  SENSORY: Intact to light touch, pinprick, positional and vibratory sensation are intact in fingers and toes.  COORDINATION: Rapid alternating movements and fine finger movements are intact. There is no dysmetria on finger-to-nose and heel-knee-shin.    GAIT/STANCE: She needs to push up on the chair arm to get up from seated position, mildly unsteady, cautious. Romberg is absent   CT HEAD Prominent small vessel disease changes without CT evidence of large acute infarct. Small or medium size infarct would be difficult to exclude given the degree white matter changes. Global atrophy without hydrocephalus.  CTA NECK 3 vessel arch with calcified plaque. Aortic atherosclerosis. Plaque with moderate to marked narrowing proximal left subclavian artery, moderate narrowing proximal  innominate and proximal left common carotid artery. Moderate narrowing proximal right subclavian artery. Moderate to mark narrowing proximal right common carotid artery. Post right carotid endarterectomy. Plaque with mild narrowing distal right common carotid artery. No significant stenosis right carotid bifurcation. Plaque distal right internal carotid artery with moderate to marked narrowing proximal to the skullbase. Moderate narrowing origin of the left common carotid artery. Plaque with mild to moderate narrowing mid to distal left common carotid artery. Plaque with 76% diameter stenosis proximal left internal carotid artery. Ectatic proximal vertebral arteries without high-grade stenosis. Several thyroid lesions measuring up to 3.1 cm. Thyroid ultrasound recommended for further delineation.  CTA HEAD  Calcified plaque with mild to slightly mild narrowing cavernous segment internal carotid artery bilaterally. Fetal contribution to the posterior cerebral artery bilaterally. Mild narrowing distal vertebral arteries. Mild narrowing basilar artery. Mild narrowing portions of the posterior cerebral arteries bilaterally.  12/26/15 CUS -  60-79% left recurrent carotid stenosis.  An 1-39 percent right carotid stenosis.  Both vertebral arteries are antegrade  06/24/17 EMG/NCS Nerve conduction studies done on the left upper and left lower extremities were within normal limits.  No evidence of a peripheral neuropathy was seen.  EMG evaluation of the left upper and left lower extremities were relatively unremarkable without evidence of an overlying radiculopathy or a myopathic disorder.  The repetitive nerve stimulation study done on the left shoulder was unremarkable, no clear evidence of a neuromuscular transmission disorder was seen.  Lab Review 06/21/15 LDL 75 and TG 217  Component     Latest Ref Rng & Units 06/03/2017  AChR Binding Ab, Serum     0.00 - 0.24 nmol/L <0.03  Acetylchol Block Ab  0 - 25 % 21  Acetylcholine Modulat Ab     0 - 20 % <12  Hemoglobin A1C     4.8 - 5.6 % 8.1 (H)  Est. average glucose Bld gHb Est-mCnc     mg/dL 186  Sed Rate     0 - 40 mm/hr 19  CRP     0.0 - 4.9 mg/L 2.9  CK Total     24 - 173 U/L 46  Anit Nuclear Antibody(ANA)     Negative Negative  Aldolase     3.3 - 10.3 U/L 4.5    Assessment:   Carotid artery disease,   S/p bilateral carotid artery endarterectomy  Peripheral vascular disease, left subclavian artery stent stenosis,  Status post stent  History of GI bleeding, only on ASA 21m daily.  Neck pain:  MRI of cervical spine showed multilevel degenerative changes there was no significant canal foraminal stenosis  Left enlarged parotid gland, tenderness upon palpation, slight left facial weakness,  Refer her to ENT for further evaluation  YMarcial Pacas M.D. Ph.D.  [facility] [address]  [id number]: [phone]:      [phone]

## 2017-09-25 ENCOUNTER — Other Ambulatory Visit: Payer: Self-pay | Admitting: *Deleted

## 2017-09-25 ENCOUNTER — Telehealth: Payer: Self-pay | Admitting: Neurology

## 2017-09-25 MED ORDER — DULOXETINE HCL 60 MG PO CPEP: 60.0000 mg | ORAL_CAPSULE | Freq: Every day | ORAL | 3 refills | Status: DC

## 2017-09-25 NOTE — Telephone Encounter (Signed)
Caitlin Oliver with [name] requesting a new prescription for DULoxetine (CYMBALTA) 60 MG capsule sent in changing it to 90 tablets with fewer refills. Stating she can accept VO at [phone] or a new e-script

## 2017-09-25 NOTE — Telephone Encounter (Signed)
Spoke to [place] at [facility] to void refills on file for duloxetine.  New 90-day rx x 3 refills sent to [facility].

## 2017-10-21 DIAGNOSIS — Z0000 Encounter for general adult medical examination without abnormal findings: Secondary | ICD-10-CM | POA: Diagnosis not present

## 2017-10-21 DIAGNOSIS — E119 Type 2 diabetes mellitus without complications: Secondary | ICD-10-CM | POA: Diagnosis not present

## 2017-10-21 DIAGNOSIS — K219 Gastro-esophageal reflux disease without esophagitis: Secondary | ICD-10-CM | POA: Diagnosis not present

## 2017-10-21 DIAGNOSIS — E7800 Pure hypercholesterolemia, unspecified: Secondary | ICD-10-CM | POA: Diagnosis not present

## 2017-10-21 DIAGNOSIS — E1151 Type 2 diabetes mellitus with diabetic peripheral angiopathy without gangrene: Secondary | ICD-10-CM | POA: Diagnosis not present

## 2017-10-21 DIAGNOSIS — I10 Essential (primary) hypertension: Secondary | ICD-10-CM | POA: Diagnosis not present

## 2017-10-30 ENCOUNTER — Other Ambulatory Visit: Payer: Self-pay | Admitting: Family Medicine

## 2017-10-30 ENCOUNTER — Ambulatory Visit
Admission: RE | Admit: 2017-10-30 | Discharge: 2017-10-30 | Disposition: A | Discharge status: Home / Self Care | Payer: PPO | Source: Ambulatory Visit | Attending: Family Medicine | Admitting: Family Medicine

## 2017-10-30 DIAGNOSIS — M79671 Pain in right foot: Secondary | ICD-10-CM

## 2017-10-30 DIAGNOSIS — M7989 Other specified soft tissue disorders: Secondary | ICD-10-CM | POA: Diagnosis not present

## 2017-10-30 DIAGNOSIS — S99921A Unspecified injury of right foot, initial encounter: Secondary | ICD-10-CM | POA: Diagnosis not present

## 2017-11-06 DIAGNOSIS — R2689 Other abnormalities of gait and mobility: Secondary | ICD-10-CM | POA: Diagnosis not present

## 2017-11-06 DIAGNOSIS — M6281 Muscle weakness (generalized): Secondary | ICD-10-CM | POA: Diagnosis not present

## 2017-11-12 DIAGNOSIS — R2689 Other abnormalities of gait and mobility: Secondary | ICD-10-CM | POA: Diagnosis not present

## 2017-11-12 DIAGNOSIS — M6281 Muscle weakness (generalized): Secondary | ICD-10-CM | POA: Diagnosis not present

## 2017-11-15 DIAGNOSIS — R2689 Other abnormalities of gait and mobility: Secondary | ICD-10-CM | POA: Diagnosis not present

## 2017-11-15 DIAGNOSIS — M6281 Muscle weakness (generalized): Secondary | ICD-10-CM | POA: Diagnosis not present

## 2017-11-20 DIAGNOSIS — R2689 Other abnormalities of gait and mobility: Secondary | ICD-10-CM | POA: Diagnosis not present

## 2017-11-20 DIAGNOSIS — M6281 Muscle weakness (generalized): Secondary | ICD-10-CM | POA: Diagnosis not present

## 2017-11-22 DIAGNOSIS — R2689 Other abnormalities of gait and mobility: Secondary | ICD-10-CM | POA: Diagnosis not present

## 2017-11-22 DIAGNOSIS — M6281 Muscle weakness (generalized): Secondary | ICD-10-CM | POA: Diagnosis not present

## 2017-11-27 DIAGNOSIS — R2689 Other abnormalities of gait and mobility: Secondary | ICD-10-CM | POA: Diagnosis not present

## 2017-11-27 DIAGNOSIS — M6281 Muscle weakness (generalized): Secondary | ICD-10-CM | POA: Diagnosis not present

## 2017-11-29 DIAGNOSIS — R2689 Other abnormalities of gait and mobility: Secondary | ICD-10-CM | POA: Diagnosis not present

## 2017-11-29 DIAGNOSIS — M6281 Muscle weakness (generalized): Secondary | ICD-10-CM | POA: Diagnosis not present

## 2017-12-04 DIAGNOSIS — R2689 Other abnormalities of gait and mobility: Secondary | ICD-10-CM | POA: Diagnosis not present

## 2017-12-04 DIAGNOSIS — M6281 Muscle weakness (generalized): Secondary | ICD-10-CM | POA: Diagnosis not present

## 2017-12-06 DIAGNOSIS — M6281 Muscle weakness (generalized): Secondary | ICD-10-CM | POA: Diagnosis not present

## 2017-12-06 DIAGNOSIS — R2689 Other abnormalities of gait and mobility: Secondary | ICD-10-CM | POA: Diagnosis not present

## 2017-12-10 DIAGNOSIS — M6281 Muscle weakness (generalized): Secondary | ICD-10-CM | POA: Diagnosis not present

## 2017-12-10 DIAGNOSIS — R2689 Other abnormalities of gait and mobility: Secondary | ICD-10-CM | POA: Diagnosis not present

## 2017-12-12 DIAGNOSIS — M6281 Muscle weakness (generalized): Secondary | ICD-10-CM | POA: Diagnosis not present

## 2017-12-12 DIAGNOSIS — R2689 Other abnormalities of gait and mobility: Secondary | ICD-10-CM | POA: Diagnosis not present

## 2017-12-23 DIAGNOSIS — R220 Localized swelling, mass and lump, head: Secondary | ICD-10-CM | POA: Diagnosis not present

## 2017-12-23 DIAGNOSIS — K1121 Acute sialoadenitis: Secondary | ICD-10-CM | POA: Diagnosis not present

## 2017-12-25 ENCOUNTER — Other Ambulatory Visit: Payer: Self-pay | Admitting: Otolaryngology

## 2017-12-25 DIAGNOSIS — K1121 Acute sialoadenitis: Secondary | ICD-10-CM

## 2018-01-08 ENCOUNTER — Ambulatory Visit
Admission: RE | Admit: 2018-01-08 | Discharge: 2018-01-08 | Disposition: A | Discharge status: Home / Self Care | Payer: PPO | Source: Ambulatory Visit | Attending: Otolaryngology | Admitting: Otolaryngology

## 2018-01-08 DIAGNOSIS — K118 Other diseases of salivary glands: Secondary | ICD-10-CM | POA: Diagnosis not present

## 2018-01-08 DIAGNOSIS — K1121 Acute sialoadenitis: Secondary | ICD-10-CM

## 2018-01-08 MED ORDER — IOPAMIDOL (ISOVUE-300) INJECTION 61%
75.0000 mL | Freq: Once | INTRAVENOUS | Status: AC | PRN
  Administered 2018-01-08: 75 mL via INTRAVENOUS

## 2018-01-16 DIAGNOSIS — Z23 Encounter for immunization: Secondary | ICD-10-CM | POA: Diagnosis not present

## 2018-03-10 ENCOUNTER — Encounter: Payer: Self-pay | Admitting: Internal Medicine

## 2018-03-10 ENCOUNTER — Ambulatory Visit (HOSPITAL_COMMUNITY)
Admission: RE | Admit: 2018-03-10 | Discharge: 2018-03-10 | Disposition: A | Discharge status: Home / Self Care | Payer: PPO | Source: Ambulatory Visit | Attending: Oncology | Admitting: Oncology

## 2018-03-10 ENCOUNTER — Telehealth: Payer: Self-pay | Admitting: Internal Medicine

## 2018-03-10 ENCOUNTER — Inpatient Hospital Stay: Payer: PPO

## 2018-03-10 ENCOUNTER — Inpatient Hospital Stay: Payer: PPO | Attending: Internal Medicine | Admitting: Internal Medicine

## 2018-03-10 VITALS — BP 136/68 | HR 99 | Temp 98.0°F | Resp 18 | Ht 64.0 in | Wt 185.2 lb

## 2018-03-10 DIAGNOSIS — C3432 Malignant neoplasm of lower lobe, left bronchus or lung: Secondary | ICD-10-CM | POA: Insufficient documentation

## 2018-03-10 DIAGNOSIS — C3490 Malignant neoplasm of unspecified part of unspecified bronchus or lung: Secondary | ICD-10-CM

## 2018-03-10 DIAGNOSIS — Z85118 Personal history of other malignant neoplasm of bronchus and lung: Secondary | ICD-10-CM

## 2018-03-10 DIAGNOSIS — I10 Essential (primary) hypertension: Secondary | ICD-10-CM

## 2018-03-10 LAB — CBC WITH DIFFERENTIAL (CANCER CENTER ONLY)
Abs Immature Granulocytes: 0.03 10*3/uL (ref 0.00–0.07)
Basophils Absolute: 0.1 10*3/uL (ref 0.0–0.1)
Basophils Relative: 1 %
EOS ABS: 0.2 10*3/uL (ref 0.0–0.5)
EOS PCT: 2 %
HEMATOCRIT: 45.8 % (ref 36.0–46.0)
Hemoglobin: 15.1 g/dL — ABNORMAL HIGH (ref 12.0–15.0)
Immature Granulocytes: 0 %
Lymphocytes Relative: 18 %
Lymphs Abs: 1.8 10*3/uL (ref 0.7–4.0)
MCH: 29.5 pg (ref 26.0–34.0)
MCHC: 33 g/dL (ref 30.0–36.0)
MCV: 89.6 fL (ref 80.0–100.0)
Monocytes Absolute: 0.7 10*3/uL (ref 0.1–1.0)
Monocytes Relative: 8 %
NRBC: 0 % (ref 0.0–0.2)
Neutro Abs: 6.9 10*3/uL (ref 1.7–7.7)
Neutrophils Relative %: 71 %
Platelet Count: 275 10*3/uL (ref 150–400)
RBC: 5.11 MIL/uL (ref 3.87–5.11)
RDW: 11.9 % (ref 11.5–15.5)
WBC: 9.6 10*3/uL (ref 4.0–10.5)

## 2018-03-10 LAB — CMP (CANCER CENTER ONLY)
ALK PHOS: 86 U/L (ref 38–126)
ALT: 29 U/L (ref 0–44)
AST: 24 U/L (ref 15–41)
Albumin: 4 g/dL (ref 3.5–5.0)
Anion gap: 9 (ref 5–15)
BILIRUBIN TOTAL: 0.4 mg/dL (ref 0.3–1.2)
BUN: 17 mg/dL (ref 8–23)
CALCIUM: 9.6 mg/dL (ref 8.9–10.3)
CO2: 31 mmol/L (ref 22–32)
CREATININE: 0.78 mg/dL (ref 0.44–1.00)
Chloride: 97 mmol/L — ABNORMAL LOW (ref 98–111)
Glucose, Bld: 129 mg/dL — ABNORMAL HIGH (ref 70–99)
Potassium: 4.4 mmol/L (ref 3.5–5.1)
Sodium: 137 mmol/L (ref 135–145)
Total Protein: 7.3 g/dL (ref 6.5–8.1)

## 2018-03-10 MED ORDER — IOHEXOL 300 MG/ML  SOLN
75.0000 mL | Freq: Once | INTRAMUSCULAR | Status: AC | PRN
  Administered 2018-03-10: 75 mL via INTRAVENOUS

## 2018-03-10 MED ORDER — SODIUM CHLORIDE (PF) 0.9 % IJ SOLN
INTRAMUSCULAR | Status: AC
  Filled 2018-03-10: qty 50

## 2018-03-10 NOTE — Telephone Encounter (Signed)
Printed calendar and avs. °

## 2018-03-10 NOTE — Progress Notes (Signed)
[place] [phone]   [phone]) [phone]  OFFICE PROGRESS NOTE  [name], PA-C [address] [place] [address]  DIAGNOSIS: Stage IA (T1a, N0, M0) non-small cell lung cancer, squamous cell carcinoma status post left lower lobectomy with lymph node sampling in October 2017.  PRIOR THERAPY: Status post left lower lobectomy with lymph node sampling in October 2017.  CURRENT THERAPY: Observation.  INTERVAL HISTORY: Caitlin Oliver 77 y.o. female returns to the clinic today for six-month follow-up visit.  The patient is feeling fine today with no concerning complaints.  She continues to have facial droop from her previous stroke.  She denied having any significant chest pain, shortness of breath, cough or hemoptysis.  She has no nausea, vomiting, diarrhea or constipation.  She denied having any headache or visual changes.  She had CT scan of the chest performed earlier today and she is here for evaluation and discussion of her risk her results.  MEDICAL HISTORY: Past Medical History:  Diagnosis Date  . Anxiety   . Carotid artery disease ([place])   . Claudication ([place])   . COPD (chronic obstructive pulmonary disease) ([place])   . Diabetes mellitus   . Dizzy   . GERD (gastroesophageal reflux disease)   . Headache   . History of kidney stones   . Hypercholesteremia   . Hypertension   . 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 Shortness of breath    with exertion  . Stomach ulcer   . Tobacco abuse     ALLERGIES:  is allergic to no known allergies.  MEDICATIONS:  Current Outpatient Medications  Medication Sig Dispense Refill  . albuterol (PROVENTIL HFA;VENTOLIN HFA) 108 (90 Base) MCG/ACT inhaler Inhale into the lungs.    [name] aspirin EC 81 MG tablet Take 81 mg by mouth daily.    [name] BIOTIN 5000 PO Take by mouth.    . calcium carbonate (CALCIUM 600) 600 MG TABS tablet Take by mouth.    .  cyclobenzaprine (FLEXERIL) 5 MG tablet Take 1 tablet (5 mg total) by mouth 3 (three) times daily as needed for muscle spasms. 30 tablet 0  . diazepam (VALIUM) 5 MG tablet Takes one tablet twice daily and one extra tablet, if needed.    . diltiazem (DILACOR XR) 180 MG 24 hr capsule Take 180 mg by mouth.    . DULoxetine (CYMBALTA) 60 MG capsule Take 1 capsule (60 mg total) by mouth daily. 90 capsule 3  . fish oil-omega-3 fatty acids 1000 MG capsule Take 1 g by mouth daily.     [name] HYDROcodone-acetaminophen (NORCO/VICODIN) 5-325 MG tablet Take 1 tablet by mouth every 6 (six) hours as needed for moderate pain.    . Magnesium Gluconate 550 MG TABS Take 250 mg by mouth.     . metFORMIN (GLUCOPHAGE) 500 MG tablet Take 500 mg by mouth 2 (two) times daily with a meal.     . metoprolol succinate (TOPROL-XL) 25 MG 24 hr tablet Take 37.5 mg by mouth 2 (two) times daily after a meal.    . naphazoline-glycerin (CLEAR EYES) 0.012-0.2 % SOLN Place 1-2 drops into both eyes 4 (four) times daily as needed (for itchy eyes).    . ONE TOUCH ULTRA TEST test strip     . pantoprazole (PROTONIX) 40 MG tablet Take 40 mg by mouth 2 (two) times daily.     . pravastatin (PRAVACHOL) 40  MG tablet Take 40 mg by mouth daily.     No current facility-administered medications for this visit.    Facility-Administered Medications Ordered in Other Visits  Medication Dose Route Frequency Provider Last Rate Last Dose  . sodium chloride (PF) 0.9 % injection             SURGICAL HISTORY:  Past Surgical History:  Procedure Laterality Date  . ABDOMINAL HYSTERECTOMY    . APPENDECTOMY    . BREAST REDUCTION SURGERY    . CAROTID ANGIOGRAM N/A 02/25/2013   Procedure: CAROTID ANGIOGRAM;  Surgeon: Lorretta Harp, MD;  Location: [facility] CATH LAB;  Service: Cardiovascular;  Laterality: N/A;  . ENDARTERECTOMY Right 03/06/2013   Procedure: ENDARTERECTOMY CAROTID-RIGHT;  Surgeon: Serafina Mitchell, MD;  Location: [facility];  Service: Vascular;   Laterality: Right;  . ENDARTERECTOMY Left 05/07/2013   Procedure: LEFT CAROTID ARTERY ENDARTERECTOMY WITH VASCU-GUARD PATCH ANGIOPLASTY ;  Surgeon: Serafina Mitchell, MD;  Location: [place];  Service: Vascular;  Laterality: Left;  . LOBECTOMY Left 02/03/2016   Procedure: LEFT UPPER LOBECTOMY;  Surgeon: [name], MD;  Location: [place];  Service: Thoracic;  Laterality: Left;  . LOWER EXTREMITY ANGIOGRAM N/A 02/25/2013   Procedure: LOWER EXTREMITY ANGIOGRAM;  Surgeon: Lorretta Harp, MD;  Location[facility] CATH LAB;  Service: Cardiovascular;  Laterality: N/A;  . LOWER EXTREMITY ANGIOGRAM N/A 07/27/2013   Procedure: LOWER EXTREMITY ANGIOGRAM;  Surgeon: Lorretta Harp, MD;  Location: [facility] CATH LAB;  Service: Cardiovascular;  Laterality: N/A;  . PATCH ANGIOPLASTY Right 03/06/2013   Procedure: PATCH ANGIOPLASTY of Right Carotid Artery using Vascu-Guard Patch;  Surgeon: Serafina Mitchell, MD;  Location: [place];  Service: Vascular;  Laterality: Right;  . PERIPHERAL VASCULAR CATHETERIZATION N/A 11/15/2015   Procedure: Aortic Arch Angiography;  Surgeon: Serafina Mitchell, MD;  Location: [place] CV LAB;  Service: Cardiovascular;  Laterality: N/A;  . PERIPHERAL VASCULAR CATHETERIZATION Bilateral 11/15/2015   Procedure: Carotid Angiography;  Surgeon: Serafina Mitchell, MD;  Location: [place] CV LAB;  Service: Cardiovascular;  Laterality: Bilateral;  . PERIPHERAL VASCULAR CATHETERIZATION Left 11/15/2015   Procedure: Upper Extremity Angiography;  Surgeon: Serafina Mitchell, MD;  Location: [place] CV LAB;  Service: Cardiovascular;  Laterality: Left;  . PERIPHERAL VASCULAR CATHETERIZATION Left 11/15/2015   Procedure: Peripheral Vascular Intervention;  Surgeon: Serafina Mitchell, MD;  Location: [place] CV LAB;  Service: Cardiovascular;  Laterality: Left;  subclavian   . SALIVARY GLAND SURGERY     scar tissue removed from left saliva glad  . TUBAL LIGATION    . VIDEO ASSISTED THORACOSCOPY (VATS)/WEDGE RESECTION  Left 02/03/2016   Procedure: VIDEO ASSISTED THORACOSCOPY;  Surgeon: [name], MD;  Location: [place];  Service: Thoracic;  Laterality: Left;    REVIEW OF SYSTEMS:  A comprehensive review of systems was negative except for: Constitutional: positive for fatigue   PHYSICAL EXAMINATION: General appearance: alert, cooperative and no distress Head: Normocephalic, without obvious abnormality, atraumatic Neck: no adenopathy, no JVD, supple, symmetrical, trachea midline and thyroid not enlarged, symmetric, no tenderness/mass/nodules Lymph nodes: Cervical, supraclavicular, and axillary nodes normal. Resp: clear to auscultation bilaterally Back: symmetric, no curvature. ROM normal. No CVA tenderness. Cardio: regular rate and rhythm, S1, S2 normal, no murmur, click, rub or gallop GI: soft, non-tender; bowel sounds normal; no masses,  no organomegaly Extremities: extremities normal, atraumatic, no cyanosis or edema  ECOG PERFORMANCE STATUS: 1 - Symptomatic but completely ambulatory  Blood pressure 136/68, pulse 99, temperature  68 F (36.7 C), temperature source Oral, resp. rate 18, height _0  (1.626 m), weight 185 lb 3.2 oz (84 kg), SpO2 94 %.  LABORATORY DATA: Lab Results  Component Value Date   WBC 9.6 03/10/2018   HGB 15.1 (H) 03/10/2018   HCT 45.8 03/10/2018   MCV 89.6 03/10/2018   PLT 275 03/10/2018      Chemistry      Component Value Date/Time   NA 137 03/10/2018 1156   NA 138 06/03/2017 1414   NA 138 03/12/2017 1121   K 4.4 03/10/2018 1156   K 4.6 03/12/2017 1121   CL 97 (L) 03/10/2018 1156   CO2 31 03/10/2018 1156   CO2 27 03/12/2017 1121   BUN 17 03/10/2018 1156   BUN 20 06/03/2017 1414   BUN 19.6 03/12/2017 1121   CREATININE 0.78 03/10/2018 1156   CREATININE 0.9 03/12/2017 1121      Component Value Date/Time   CALCIUM 9.6 03/10/2018 1156   CALCIUM 9.6 03/12/2017 1121   ALKPHOS 86 03/10/2018 1156   ALKPHOS 91 03/12/2017 1121   AST 24 03/10/2018 1156   AST  38 (H) 03/12/2017 1121   ALT 29 03/10/2018 1156   ALT 38 03/12/2017 1121   BILITOT 0.4 03/10/2018 1156   BILITOT 0.44 03/12/2017 1121       RADIOGRAPHIC STUDIES: No results found.  ASSESSMENT AND PLAN: This is a very pleasant 77 years old white female with a stage IA non-small cell lung cancer, squamous cell carcinoma status post left lower lobectomy with lymph node dissection. The patient has been on observation since that time and she is feeling fine. She had repeat CT scan of the chest performed earlier today.  The scan report is still pending.  I personally and independently reviewed the scan images and discussed the results with the patient today.  I did not see any evidence for disease progression but I would wait for the final report for confirmation. If this scan showed no concerning findings for disease recurrence or progression, I will see her back for follow-up visit in 1 year with repeat CT scan of the chest. The patient was advised to call immediately if she has any concerning symptoms in the interval. The patient voices understanding of current disease status and treatment options and is in agreement with the current care plan. All questions were answered. The patient knows to call the clinic with any problems, questions or concerns. We can certainly see the patient much sooner if necessary.  Disclaimer: This note was dictated with voice recognition software. Similar sounding words can inadvertently be transcribed and may not be corrected upon review.

## 2018-03-18 ENCOUNTER — Telehealth: Payer: Self-pay | Admitting: Medical Oncology

## 2018-03-18 NOTE — Telephone Encounter (Signed)
LVM to return call regarding message below. I changed dates for labs and scan to June 16th. Central scheduling will call pt with appt later.

## 2018-03-18 NOTE — Telephone Encounter (Signed)
Pt notified.

## 2018-03-18 NOTE — Telephone Encounter (Signed)
-----  Message from Curt Bears, MD sent at 03/11/2018  4:36 PM EST ----- There is a small nodule in the lung that is considered new.  I would like to repeat her scan in 6 months rather than a year. Thank you. ----- Message ----- From: Maryanna Shape, NP Sent: 03/11/2018   9:01 AM EST To: Curt Bears, MD    ----- Message ----- From: Interface, Rad Results In Sent: 03/10/2018   4:32 PM EST To: Maryanna Shape, NP

## 2018-03-20 ENCOUNTER — Telehealth: Payer: Self-pay | Admitting: Internal Medicine

## 2018-03-20 NOTE — Telephone Encounter (Signed)
Scheduled appt per 12/3 sch message- sent reminder letter in the mail with appt date and time

## 2018-04-21 DIAGNOSIS — J449 Chronic obstructive pulmonary disease, unspecified: Secondary | ICD-10-CM | POA: Diagnosis not present

## 2018-04-21 DIAGNOSIS — E7800 Pure hypercholesterolemia, unspecified: Secondary | ICD-10-CM | POA: Diagnosis not present

## 2018-04-21 DIAGNOSIS — R29898 Other symptoms and signs involving the musculoskeletal system: Secondary | ICD-10-CM | POA: Diagnosis not present

## 2018-04-21 DIAGNOSIS — R0781 Pleurodynia: Secondary | ICD-10-CM | POA: Diagnosis not present

## 2018-04-21 DIAGNOSIS — I10 Essential (primary) hypertension: Secondary | ICD-10-CM | POA: Diagnosis not present

## 2018-04-21 DIAGNOSIS — E119 Type 2 diabetes mellitus without complications: Secondary | ICD-10-CM | POA: Diagnosis not present

## 2018-04-21 DIAGNOSIS — K219 Gastro-esophageal reflux disease without esophagitis: Secondary | ICD-10-CM | POA: Diagnosis not present

## 2018-04-21 DIAGNOSIS — I739 Peripheral vascular disease, unspecified: Secondary | ICD-10-CM | POA: Diagnosis not present

## 2018-04-22 DIAGNOSIS — E7800 Pure hypercholesterolemia, unspecified: Secondary | ICD-10-CM | POA: Diagnosis not present

## 2018-04-22 DIAGNOSIS — E119 Type 2 diabetes mellitus without complications: Secondary | ICD-10-CM | POA: Diagnosis not present

## 2018-05-07 ENCOUNTER — Telehealth: Payer: Self-pay | Admitting: Cardiovascular Disease

## 2018-05-07 ENCOUNTER — Other Ambulatory Visit: Payer: Self-pay

## 2018-05-07 DIAGNOSIS — R911 Solitary pulmonary nodule: Secondary | ICD-10-CM

## 2018-05-07 DIAGNOSIS — I10 Essential (primary) hypertension: Secondary | ICD-10-CM

## 2018-05-07 DIAGNOSIS — I739 Peripheral vascular disease, unspecified: Secondary | ICD-10-CM

## 2018-05-07 NOTE — Telephone Encounter (Signed)
Returned call to patient she stated she has her 1 year follow up visit with Dr.Berry 06/13/18 at 11:15 am.She wanted to know if she needed to have chest ct and abd dopplers done before she sees Dr.Berry.Advised scheduler will call back to schedule follow up abd dopplers and chest ct.She will reschedule follow up appointment with Dr.Berry until after test so he can discuss results at office visit.

## 2018-05-07 NOTE — Telephone Encounter (Signed)
New Message:    Patient calling concerning her appt. Patient would like to know if she need any labs or a CT scan patient would like for some one to call back.

## 2018-05-13 ENCOUNTER — Other Ambulatory Visit: Payer: Self-pay | Admitting: Physician Assistant

## 2018-05-13 ENCOUNTER — Ambulatory Visit
Admission: RE | Admit: 2018-05-13 | Discharge: 2018-05-13 | Disposition: A | Discharge status: Home / Self Care | Payer: PPO | Source: Ambulatory Visit | Attending: Physician Assistant | Admitting: Physician Assistant

## 2018-05-13 DIAGNOSIS — R079 Chest pain, unspecified: Secondary | ICD-10-CM | POA: Diagnosis not present

## 2018-05-13 DIAGNOSIS — R300 Dysuria: Secondary | ICD-10-CM | POA: Diagnosis not present

## 2018-05-13 DIAGNOSIS — R82998 Other abnormal findings in urine: Secondary | ICD-10-CM | POA: Diagnosis not present

## 2018-05-13 DIAGNOSIS — R0602 Shortness of breath: Secondary | ICD-10-CM

## 2018-05-13 DIAGNOSIS — R05 Cough: Secondary | ICD-10-CM | POA: Diagnosis not present

## 2018-05-13 DIAGNOSIS — J22 Unspecified acute lower respiratory infection: Secondary | ICD-10-CM | POA: Diagnosis not present

## 2018-05-13 DIAGNOSIS — M546 Pain in thoracic spine: Secondary | ICD-10-CM | POA: Diagnosis not present

## 2018-05-13 DIAGNOSIS — N3001 Acute cystitis with hematuria: Secondary | ICD-10-CM | POA: Diagnosis not present

## 2018-06-11 ENCOUNTER — Other Ambulatory Visit: Payer: Self-pay | Admitting: Cardiovascular Disease

## 2018-06-11 ENCOUNTER — Ambulatory Visit (HOSPITAL_BASED_OUTPATIENT_CLINIC_OR_DEPARTMENT_OTHER)
Admission: RE | Admit: 2018-06-11 | Discharge: 2018-06-11 | Disposition: A | Discharge status: Home / Self Care | Payer: PPO | Source: Ambulatory Visit | Attending: Cardiovascular Disease | Admitting: Cardiovascular Disease

## 2018-06-11 ENCOUNTER — Ambulatory Visit (HOSPITAL_COMMUNITY)
Admission: RE | Admit: 2018-06-11 | Discharge: 2018-06-11 | Disposition: A | Discharge status: Home / Self Care | Payer: PPO | Source: Ambulatory Visit | Attending: Cardiology | Admitting: Cardiology

## 2018-06-11 DIAGNOSIS — I739 Peripheral vascular disease, unspecified: Secondary | ICD-10-CM | POA: Diagnosis not present

## 2018-06-11 DIAGNOSIS — Z95820 Peripheral vascular angioplasty status with implants and grafts: Secondary | ICD-10-CM | POA: Insufficient documentation

## 2018-06-12 ENCOUNTER — Inpatient Hospital Stay: Admission: RE | Admit: 2018-06-12 | Payer: PPO | Source: Ambulatory Visit

## 2018-06-13 ENCOUNTER — Ambulatory Visit: Payer: PPO | Admitting: Cardiovascular Disease

## 2018-06-16 ENCOUNTER — Emergency Department (HOSPITAL_COMMUNITY): Payer: PPO

## 2018-06-16 ENCOUNTER — Encounter (HOSPITAL_COMMUNITY): Payer: Self-pay

## 2018-06-16 ENCOUNTER — Inpatient Hospital Stay (HOSPITAL_COMMUNITY)
Admission: EM | Admit: 2018-06-16 | Discharge: 2018-06-20 | DRG: 065 | Disposition: A | Discharge status: Skilled Nursing Facility | Payer: PPO | Attending: Internal Medicine | Admitting: Internal Medicine

## 2018-06-16 ENCOUNTER — Other Ambulatory Visit: Payer: Self-pay

## 2018-06-16 DIAGNOSIS — G459 Transient cerebral ischemic attack, unspecified: Secondary | ICD-10-CM | POA: Diagnosis not present

## 2018-06-16 DIAGNOSIS — R0602 Shortness of breath: Secondary | ICD-10-CM | POA: Diagnosis not present

## 2018-06-16 DIAGNOSIS — Z8249 Family history of ischemic heart disease and other diseases of the circulatory system: Secondary | ICD-10-CM

## 2018-06-16 DIAGNOSIS — R1900 Intra-abdominal and pelvic swelling, mass and lump, unspecified site: Secondary | ICD-10-CM | POA: Diagnosis not present

## 2018-06-16 DIAGNOSIS — I2723 Pulmonary hypertension due to lung diseases and hypoxia: Secondary | ICD-10-CM | POA: Diagnosis present

## 2018-06-16 DIAGNOSIS — E785 Hyperlipidemia, unspecified: Secondary | ICD-10-CM | POA: Diagnosis not present

## 2018-06-16 DIAGNOSIS — Z79891 Long term (current) use of opiate analgesic: Secondary | ICD-10-CM

## 2018-06-16 DIAGNOSIS — I110 Hypertensive heart disease with heart failure: Secondary | ICD-10-CM | POA: Diagnosis present

## 2018-06-16 DIAGNOSIS — E1151 Type 2 diabetes mellitus with diabetic peripheral angiopathy without gangrene: Secondary | ICD-10-CM | POA: Diagnosis present

## 2018-06-16 DIAGNOSIS — I6389 Other cerebral infarction: Principal | ICD-10-CM | POA: Diagnosis present

## 2018-06-16 DIAGNOSIS — Z79899 Other long term (current) drug therapy: Secondary | ICD-10-CM

## 2018-06-16 DIAGNOSIS — E7800 Pure hypercholesterolemia, unspecified: Secondary | ICD-10-CM | POA: Diagnosis present

## 2018-06-16 DIAGNOSIS — D696 Thrombocytopenia, unspecified: Secondary | ICD-10-CM | POA: Diagnosis not present

## 2018-06-16 DIAGNOSIS — Z87891 Personal history of nicotine dependence: Secondary | ICD-10-CM

## 2018-06-16 DIAGNOSIS — C3492 Malignant neoplasm of unspecified part of left bronchus or lung: Secondary | ICD-10-CM | POA: Diagnosis present

## 2018-06-16 DIAGNOSIS — E041 Nontoxic single thyroid nodule: Secondary | ICD-10-CM | POA: Diagnosis present

## 2018-06-16 DIAGNOSIS — R278 Other lack of coordination: Secondary | ICD-10-CM | POA: Diagnosis not present

## 2018-06-16 DIAGNOSIS — K219 Gastro-esophageal reflux disease without esophagitis: Secondary | ICD-10-CM | POA: Diagnosis not present

## 2018-06-16 DIAGNOSIS — R270 Ataxia, unspecified: Secondary | ICD-10-CM | POA: Diagnosis present

## 2018-06-16 DIAGNOSIS — R2242 Localized swelling, mass and lump, left lower limb: Secondary | ICD-10-CM | POA: Diagnosis not present

## 2018-06-16 DIAGNOSIS — Z7984 Long term (current) use of oral hypoglycemic drugs: Secondary | ICD-10-CM

## 2018-06-16 DIAGNOSIS — E669 Obesity, unspecified: Secondary | ICD-10-CM | POA: Diagnosis not present

## 2018-06-16 DIAGNOSIS — Z6833 Body mass index (BMI) 33.0-33.9, adult: Secondary | ICD-10-CM | POA: Diagnosis not present

## 2018-06-16 DIAGNOSIS — I5032 Chronic diastolic (congestive) heart failure: Secondary | ICD-10-CM | POA: Diagnosis present

## 2018-06-16 DIAGNOSIS — I708 Atherosclerosis of other arteries: Secondary | ICD-10-CM | POA: Diagnosis present

## 2018-06-16 DIAGNOSIS — R55 Syncope and collapse: Secondary | ICD-10-CM | POA: Diagnosis present

## 2018-06-16 DIAGNOSIS — I63233 Cerebral infarction due to unspecified occlusion or stenosis of bilateral carotid arteries: Secondary | ICD-10-CM | POA: Diagnosis not present

## 2018-06-16 DIAGNOSIS — I672 Cerebral atherosclerosis: Secondary | ICD-10-CM | POA: Diagnosis present

## 2018-06-16 DIAGNOSIS — Z841 Family history of disorders of kidney and ureter: Secondary | ICD-10-CM

## 2018-06-16 DIAGNOSIS — R1312 Dysphagia, oropharyngeal phase: Secondary | ICD-10-CM | POA: Diagnosis not present

## 2018-06-16 DIAGNOSIS — F419 Anxiety disorder, unspecified: Secondary | ICD-10-CM | POA: Diagnosis not present

## 2018-06-16 DIAGNOSIS — I82409 Acute embolism and thrombosis of unspecified deep veins of unspecified lower extremity: Secondary | ICD-10-CM | POA: Diagnosis not present

## 2018-06-16 DIAGNOSIS — R410 Disorientation, unspecified: Secondary | ICD-10-CM | POA: Diagnosis not present

## 2018-06-16 DIAGNOSIS — G4733 Obstructive sleep apnea (adult) (pediatric): Secondary | ICD-10-CM | POA: Diagnosis not present

## 2018-06-16 DIAGNOSIS — I69392 Facial weakness following cerebral infarction: Secondary | ICD-10-CM | POA: Diagnosis not present

## 2018-06-16 DIAGNOSIS — J411 Mucopurulent chronic bronchitis: Secondary | ICD-10-CM | POA: Diagnosis present

## 2018-06-16 DIAGNOSIS — E860 Dehydration: Secondary | ICD-10-CM | POA: Diagnosis present

## 2018-06-16 DIAGNOSIS — J439 Emphysema, unspecified: Secondary | ICD-10-CM | POA: Diagnosis not present

## 2018-06-16 DIAGNOSIS — I639 Cerebral infarction, unspecified: Secondary | ICD-10-CM | POA: Diagnosis not present

## 2018-06-16 DIAGNOSIS — R4781 Slurred speech: Secondary | ICD-10-CM | POA: Diagnosis not present

## 2018-06-16 DIAGNOSIS — Z7401 Bed confinement status: Secondary | ICD-10-CM | POA: Diagnosis not present

## 2018-06-16 DIAGNOSIS — I10 Essential (primary) hypertension: Secondary | ICD-10-CM

## 2018-06-16 DIAGNOSIS — R29818 Other symptoms and signs involving the nervous system: Secondary | ICD-10-CM | POA: Diagnosis not present

## 2018-06-16 DIAGNOSIS — I361 Nonrheumatic tricuspid (valve) insufficiency: Secondary | ICD-10-CM | POA: Diagnosis not present

## 2018-06-16 DIAGNOSIS — M2550 Pain in unspecified joint: Secondary | ICD-10-CM | POA: Diagnosis not present

## 2018-06-16 DIAGNOSIS — E0510 Thyrotoxicosis with toxic single thyroid nodule without thyrotoxic crisis or storm: Secondary | ICD-10-CM

## 2018-06-16 DIAGNOSIS — E119 Type 2 diabetes mellitus without complications: Secondary | ICD-10-CM

## 2018-06-16 DIAGNOSIS — R531 Weakness: Secondary | ICD-10-CM | POA: Diagnosis not present

## 2018-06-16 DIAGNOSIS — E1165 Type 2 diabetes mellitus with hyperglycemia: Secondary | ICD-10-CM | POA: Diagnosis present

## 2018-06-16 DIAGNOSIS — R079 Chest pain, unspecified: Secondary | ICD-10-CM | POA: Diagnosis present

## 2018-06-16 DIAGNOSIS — I272 Pulmonary hypertension, unspecified: Secondary | ICD-10-CM | POA: Diagnosis not present

## 2018-06-16 DIAGNOSIS — Z85118 Personal history of other malignant neoplasm of bronchus and lung: Secondary | ICD-10-CM

## 2018-06-16 DIAGNOSIS — Z9981 Dependence on supplemental oxygen: Secondary | ICD-10-CM | POA: Diagnosis not present

## 2018-06-16 DIAGNOSIS — E871 Hypo-osmolality and hyponatremia: Secondary | ICD-10-CM | POA: Diagnosis not present

## 2018-06-16 DIAGNOSIS — Z7982 Long term (current) use of aspirin: Secondary | ICD-10-CM

## 2018-06-16 DIAGNOSIS — I69398 Other sequelae of cerebral infarction: Secondary | ICD-10-CM | POA: Diagnosis not present

## 2018-06-16 DIAGNOSIS — Z902 Acquired absence of lung [part of]: Secondary | ICD-10-CM

## 2018-06-16 DIAGNOSIS — Z823 Family history of stroke: Secondary | ICD-10-CM

## 2018-06-16 DIAGNOSIS — Z95820 Peripheral vascular angioplasty status with implants and grafts: Secondary | ICD-10-CM

## 2018-06-16 DIAGNOSIS — N179 Acute kidney failure, unspecified: Secondary | ICD-10-CM | POA: Diagnosis present

## 2018-06-16 DIAGNOSIS — M6281 Muscle weakness (generalized): Secondary | ICD-10-CM | POA: Diagnosis not present

## 2018-06-16 DIAGNOSIS — E0590 Thyrotoxicosis, unspecified without thyrotoxic crisis or storm: Secondary | ICD-10-CM | POA: Diagnosis not present

## 2018-06-16 DIAGNOSIS — E1159 Type 2 diabetes mellitus with other circulatory complications: Secondary | ICD-10-CM | POA: Diagnosis present

## 2018-06-16 DIAGNOSIS — R41841 Cognitive communication deficit: Secondary | ICD-10-CM | POA: Diagnosis not present

## 2018-06-16 DIAGNOSIS — I739 Peripheral vascular disease, unspecified: Secondary | ICD-10-CM | POA: Diagnosis present

## 2018-06-16 LAB — CBC
HCT: 43.4 % (ref 36.0–46.0)
Hemoglobin: 13.3 g/dL (ref 12.0–15.0)
MCH: 28 pg (ref 26.0–34.0)
MCHC: 30.6 g/dL (ref 30.0–36.0)
MCV: 91.4 fL (ref 80.0–100.0)
Platelets: 300 10*3/uL (ref 150–400)
RBC: 4.75 MIL/uL (ref 3.87–5.11)
RDW: 13.6 % (ref 11.5–15.5)
WBC: 9.9 10*3/uL (ref 4.0–10.5)
nRBC: 0 % (ref 0.0–0.2)

## 2018-06-16 LAB — I-STAT TROPONIN, ED
Troponin i, poc: 0.01 ng/mL (ref 0.00–0.08)
Troponin i, poc: 0.01 ng/mL (ref 0.00–0.08)

## 2018-06-16 LAB — DIFFERENTIAL
Abs Immature Granulocytes: 0.05 10*3/uL (ref 0.00–0.07)
Basophils Absolute: 0.1 10*3/uL (ref 0.0–0.1)
Basophils Relative: 1 %
Eosinophils Absolute: 0.1 10*3/uL (ref 0.0–0.5)
Eosinophils Relative: 1 %
IMMATURE GRANULOCYTES: 1 %
LYMPHS ABS: 1.4 10*3/uL (ref 0.7–4.0)
Lymphocytes Relative: 15 %
Monocytes Absolute: 0.7 10*3/uL (ref 0.1–1.0)
Monocytes Relative: 7 %
Neutro Abs: 7.6 10*3/uL (ref 1.7–7.7)
Neutrophils Relative %: 75 %

## 2018-06-16 LAB — COMPREHENSIVE METABOLIC PANEL
ALT: 64 U/L — ABNORMAL HIGH (ref 0–44)
AST: 51 U/L — ABNORMAL HIGH (ref 15–41)
Albumin: 3.5 g/dL (ref 3.5–5.0)
Alkaline Phosphatase: 70 U/L (ref 38–126)
Anion gap: 13 (ref 5–15)
BUN: 30 mg/dL — ABNORMAL HIGH (ref 8–23)
CO2: 23 mmol/L (ref 22–32)
Calcium: 9.4 mg/dL (ref 8.9–10.3)
Chloride: 96 mmol/L — ABNORMAL LOW (ref 98–111)
Creatinine, Ser: 1.32 mg/dL — ABNORMAL HIGH (ref 0.44–1.00)
GFR calc Af Amer: 45 mL/min — ABNORMAL LOW (ref >60)
GFR calc non Af Amer: 39 mL/min — ABNORMAL LOW (ref >60)
GLUCOSE: 131 mg/dL — AB (ref 70–99)
Potassium: 5.1 mmol/L (ref 3.5–5.1)
Sodium: 132 mmol/L — ABNORMAL LOW (ref 135–145)
Total Bilirubin: 0.7 mg/dL (ref 0.3–1.2)
Total Protein: 6.4 g/dL — ABNORMAL LOW (ref 6.5–8.1)

## 2018-06-16 LAB — D-DIMER, QUANTITATIVE: D-Dimer, Quant: 0.85 ug/mL-FEU — ABNORMAL HIGH (ref 0.00–0.50)

## 2018-06-16 LAB — CBG MONITORING, ED: Glucose-Capillary: 106 mg/dL — ABNORMAL HIGH (ref 70–99)

## 2018-06-16 LAB — PROTIME-INR
INR: 1.1 (ref 0.8–1.2)
Prothrombin Time: 14.3 seconds (ref 11.4–15.2)

## 2018-06-16 LAB — APTT: aPTT: 28 seconds (ref 24–36)

## 2018-06-16 LAB — I-STAT CREATININE, ED: Creatinine, Ser: 1 mg/dL (ref 0.44–1.00)

## 2018-06-16 MED ORDER — METOPROLOL TARTRATE 25 MG PO TABS
37.5000 mg | ORAL_TABLET | Freq: Two times a day (BID) | ORAL | Status: DC
  Administered 2018-06-17 – 2018-06-18 (×4): 37.5 mg via ORAL
  Filled 2018-06-16 (×4): qty 1

## 2018-06-16 MED ORDER — OMEGA-3-ACID ETHYL ESTERS 1 G PO CAPS
1.0000 g | ORAL_CAPSULE | Freq: Every day | ORAL | Status: DC
  Administered 2018-06-17 – 2018-06-20 (×4): 1 g via ORAL
  Filled 2018-06-16 (×4): qty 1

## 2018-06-16 MED ORDER — ASPIRIN 81 MG PO CHEW
324.0000 mg | CHEWABLE_TABLET | Freq: Once | ORAL | Status: AC
  Administered 2018-06-16: 324 mg via ORAL
  Filled 2018-06-16: qty 4

## 2018-06-16 MED ORDER — ASPIRIN EC 81 MG PO TBEC: 81.0000 mg | DELAYED_RELEASE_TABLET | Freq: Every day | ORAL | Status: DC

## 2018-06-16 MED ORDER — PRAVASTATIN SODIUM 40 MG PO TABS
40.0000 mg | ORAL_TABLET | Freq: Every day | ORAL | Status: DC
  Administered 2018-06-17 – 2018-06-19 (×3): 40 mg via ORAL
  Filled 2018-06-16 (×4): qty 1

## 2018-06-16 MED ORDER — LACTATED RINGERS IV BOLUS
1000.0000 mL | Freq: Once | INTRAVENOUS | Status: AC
  Administered 2018-06-16: 1000 mL via INTRAVENOUS

## 2018-06-16 MED ORDER — ENOXAPARIN SODIUM 40 MG/0.4ML ~~LOC~~ SOLN
40.0000 mg | Freq: Every day | SUBCUTANEOUS | Status: DC
  Administered 2018-06-17 – 2018-06-20 (×4): 40 mg via SUBCUTANEOUS
  Filled 2018-06-16 (×4): qty 0.4

## 2018-06-16 MED ORDER — SODIUM CHLORIDE 0.9% FLUSH
3.0000 mL | Freq: Once | INTRAVENOUS | Status: AC
  Administered 2018-06-16: 3 mL via INTRAVENOUS

## 2018-06-16 MED ORDER — PANTOPRAZOLE SODIUM 40 MG PO TBEC
40.0000 mg | DELAYED_RELEASE_TABLET | Freq: Two times a day (BID) | ORAL | Status: DC
  Administered 2018-06-17 – 2018-06-20 (×8): 40 mg via ORAL
  Filled 2018-06-16 (×8): qty 1

## 2018-06-16 MED ORDER — ALBUTEROL SULFATE (2.5 MG/3ML) 0.083% IN NEBU: 3.0000 mL | INHALATION_SOLUTION | Freq: Four times a day (QID) | RESPIRATORY_TRACT | Status: DC | PRN

## 2018-06-16 MED ORDER — ACETAMINOPHEN 650 MG RE SUPP: 650.0000 mg | Freq: Four times a day (QID) | RECTAL | Status: DC | PRN

## 2018-06-16 MED ORDER — DULOXETINE HCL 60 MG PO CPEP
60.0000 mg | ORAL_CAPSULE | Freq: Every day | ORAL | Status: DC
  Administered 2018-06-17 – 2018-06-19 (×4): 60 mg via ORAL
  Filled 2018-06-16 (×5): qty 1

## 2018-06-16 MED ORDER — INSULIN ASPART 100 UNIT/ML ~~LOC~~ SOLN
0.0000 [IU] | Freq: Three times a day (TID) | SUBCUTANEOUS | Status: DC
  Administered 2018-06-17: 3 [IU] via SUBCUTANEOUS
  Administered 2018-06-17 – 2018-06-19 (×3): 1 [IU] via SUBCUTANEOUS
  Administered 2018-06-19: 2 [IU] via SUBCUTANEOUS

## 2018-06-16 MED ORDER — ACETAMINOPHEN 325 MG PO TABS: 650.0000 mg | ORAL_TABLET | Freq: Four times a day (QID) | ORAL | Status: DC | PRN

## 2018-06-16 MED ORDER — DILTIAZEM HCL ER COATED BEADS 180 MG PO CP24
180.0000 mg | ORAL_CAPSULE | Freq: Every day | ORAL | Status: DC
  Administered 2018-06-17 – 2018-06-20 (×4): 180 mg via ORAL
  Filled 2018-06-16 (×4): qty 1

## 2018-06-16 MED ORDER — CYCLOBENZAPRINE HCL 5 MG PO TABS
5.0000 mg | ORAL_TABLET | Freq: Three times a day (TID) | ORAL | Status: DC | PRN
  Administered 2018-06-17: 5 mg via ORAL
  Filled 2018-06-16: qty 1

## 2018-06-16 MED ORDER — ONDANSETRON HCL 4 MG PO TABS: 4.0000 mg | ORAL_TABLET | Freq: Four times a day (QID) | ORAL | Status: DC | PRN

## 2018-06-16 MED ORDER — ONDANSETRON HCL 4 MG/2ML IJ SOLN
4.0000 mg | Freq: Four times a day (QID) | INTRAMUSCULAR | Status: DC | PRN
  Administered 2018-06-19: 4 mg via INTRAVENOUS
  Filled 2018-06-16: qty 2

## 2018-06-16 MED ORDER — HYDROCODONE-ACETAMINOPHEN 5-325 MG PO TABS
1.0000 | ORAL_TABLET | Freq: Four times a day (QID) | ORAL | Status: DC | PRN
  Administered 2018-06-17 – 2018-06-19 (×4): 1 via ORAL
  Filled 2018-06-16 (×4): qty 1

## 2018-06-16 NOTE — ED Notes (Signed)
Pt appears cyanotic; no O2 sats recorded on monitor; SN repositioned patient and changed setting; patient desating 86-92%; SN placed patient on 2L via [place]

## 2018-06-16 NOTE — ED Provider Notes (Signed)
I saw and evaluated the patient, reviewed the resident's note and I agree with the findings and plan.  EKG: None 78 year old female who is had gradual aggressive weakness times several days.  Today her legs gave out and she fell down just prior to arrival.  Patient has baseline left-sided facial droop but her ataxia is new.  Will admit for further evaluation   Lacretia Leigh, MD 06/16/18 2001

## 2018-06-16 NOTE — ED Provider Notes (Signed)
[facility] EMERGENCY DEPARTMENT Provider Note   CSN: [id number] Arrival date & time: 06/16/18  1723    History   Chief Complaint Chief Complaint  Patient presents with  . Altered Mental Status    HPI Caitlin Oliver is a 78 y.o. female.      Loss of Consciousness  Episode history:  Single Most recent episode:  Today Timing:  Rare Progression:  Resolved Chronicity:  New Context comment:  Standing Witnessed: yes (by her son)   Relieved by:  None tried Worsened by:  Nothing Ineffective treatments:  None tried Associated symptoms: chest pain, recent fall and weakness   Associated symptoms: no fever, no focal weakness, no nausea, no palpitations, no seizures, no shortness of breath and no vomiting   Risk factors: no seizures     Past Medical History:  Diagnosis Date  . Anxiety   . Carotid artery disease ([place])   . Claudication ([place])   . COPD (chronic obstructive pulmonary disease) ([place])   . Diabetes mellitus   . Dizzy   . GERD (gastroesophageal reflux disease)   . Headache   . History of kidney stones   . Hypercholesteremia   . Hypertension   . 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 Shortness of breath    with exertion  . Stomach ulcer   . Tobacco abuse     Patient Active Problem List   Diagnosis Date Noted  . Syncope 06/16/2018  . Pain 09/23/2017  . Cervicalgia 07/16/2017  . Pain in both upper extremities 06/03/2017  . Pain in both lower extremities 06/03/2017  . Bilateral carotid artery stenosis 06/03/2017  . Benign paroxysmal positional vertigo 10/23/2016  . Headache 06/19/2016  . Cancer of left lung ([place]) 02/03/2016  . COPD GOLD II 12/08/2015  . Solitary pulmonary nodule 12/08/2015  . Bilateral occipital neuralgia 11/22/2015  . Double vision 10/28/2015  . TIA (transient ischemic attack) 10/28/2015  . Subclavian artery stenosis, left ([place]) 10/28/2015  . Subclavian steal syndrome  10/28/2015  . Acute blood loss anemia 10/20/2013  . C. difficile colitis 10/19/2013  . GI bleed 10/18/2013  . COPD 10/18/2013  . Bleeding gastrointestinal 10/18/2013  . Hypoxia 10/10/2013  . Bronchitis 10/08/2013  . PVD (peripheral vascular disease) with claudication ([place]) 07/29/2013  . Carotid stenosis 05/07/2013  . Stenosis of left carotid artery 05/07/2013  . Occlusion and stenosis of carotid artery without mention of cerebral infarction 03/02/2013  . Carotid artery obstruction 03/02/2013  . Carotid artery disease ([place]) 02/27/2013  . Tobacco abuse 02/27/2013  . Compulsive tobacco user syndrome 02/27/2013  . Claudication ([place]) 01/20/2013  . Essential hypertension 01/20/2013  . Type 2 diabetes mellitus ([place]) 01/20/2013  . Hyperlipidemia 01/20/2013    Past Surgical History:  Procedure Laterality Date  . ABDOMINAL HYSTERECTOMY    . APPENDECTOMY    . BREAST REDUCTION SURGERY    . CAROTID ANGIOGRAM N/A 02/25/2013   Procedure: CAROTID ANGIOGRAM;  Surgeon: Lorretta Harp, MD;  Location: [facility] CATH LAB;  Service: Cardiovascular;  Laterality: N/A;  . ENDARTERECTOMY Right 03/06/2013   Procedure: ENDARTERECTOMY CAROTID-RIGHT;  Surgeon: Serafina Mitchell, MD;  Location: [facility];  Service: Vascular;  Laterality: Right;  . ENDARTERECTOMY Left 05/07/2013   Procedure: LEFT CAROTID ARTERY ENDARTERECTOMY WITH VASCU-GUARD PATCH ANGIOPLASTY ;  Surgeon: Serafina Mitchell, MD;  Location: [facility];  Service: Vascular;  Laterality: Left;  . LOBECTOMY Left 02/03/2016   Procedure: LEFT UPPER LOBECTOMY;  Surgeon: [name], MD;  Location: [facility];  Service: Thoracic;  Laterality: Left;  . LOWER EXTREMITY ANGIOGRAM N/A 02/25/2013   Procedure: LOWER EXTREMITY ANGIOGRAM;  Surgeon: Lorretta Harp, MD;  Location: [facility] CATH LAB;  Service: Cardiovascular;  Laterality: N/A;  . LOWER EXTREMITY ANGIOGRAM N/A 07/27/2013   Procedure: LOWER EXTREMITY ANGIOGRAM;  Surgeon: Lorretta Harp, MD;  Location: [facility] CATH LAB;   Service: Cardiovascular;  Laterality: N/A;  . PATCH ANGIOPLASTY Right 03/06/2013   Procedure: PATCH ANGIOPLASTY of Right Carotid Artery using Vascu-Guard Patch;  Surgeon: Serafina Mitchell, MD;  Location: [facility];  Service: Vascular;  Laterality: Right;  . PERIPHERAL VASCULAR CATHETERIZATION N/A 11/15/2015   Procedure: Aortic Arch Angiography;  Surgeon: Serafina Mitchell, MD;  Location: [facility] CV LAB;  Service: Cardiovascular;  Laterality: N/A;  . PERIPHERAL VASCULAR CATHETERIZATION Bilateral 11/15/2015   Procedure: Carotid Angiography;  Surgeon: Serafina Mitchell, MD;  Location: [facility] CV LAB;  Service: Cardiovascular;  Laterality: Bilateral;  . PERIPHERAL VASCULAR CATHETERIZATION Left 11/15/2015   Procedure: Upper Extremity Angiography;  Surgeon: Serafina Mitchell, MD;  Location: [facility] CV LAB;  Service: Cardiovascular;  Laterality: Left;  . PERIPHERAL VASCULAR CATHETERIZATION Left 11/15/2015   Procedure: Peripheral Vascular Intervention;  Surgeon: Serafina Mitchell, MD;  Location: [facility] CV LAB;  Service: Cardiovascular;  Laterality: Left;  subclavian   . SALIVARY GLAND SURGERY     scar tissue removed from left saliva glad  . TUBAL LIGATION    . VIDEO ASSISTED THORACOSCOPY (VATS)/WEDGE RESECTION Left 02/03/2016   Procedure: VIDEO ASSISTED THORACOSCOPY;  Surgeon: [name], MD;  Location: [facility];  Service: Thoracic;  Laterality: Left;     OB History   No obstetric history on file.      Home Medications    Prior to Admission medications   Medication Sig Start Date End Date Taking? Authorizing Provider  albuterol (PROVENTIL HFA;VENTOLIN HFA) 108 (90 Base) MCG/ACT inhaler Inhale 2 puffs into the lungs every 6 (six) hours as needed for wheezing or shortness of breath.  07/11/17  Yes [provider]  aspirin EC 81 MG tablet Take 81 mg by mouth daily.   Yes [provider]  Aspirin-Acetaminophen-Caffeine (GOODY HEADACHE PO) Take 1 packet by mouth 2 (two) times  daily as needed (headache).   Yes [provider]  BIOTIN 5000 PO Take 5,000 mg by mouth daily before supper.    Yes [provider]  calcium carbonate (CALCIUM 600) 600 MG TABS tablet Take 600 mg by mouth daily before supper.    Yes [provider]  cyclobenzaprine (FLEXERIL) 5 MG tablet Take 1 tablet (5 mg total) by mouth 3 (three) times daily as needed for muscle spasms. 07/16/17  Yes Rosalin Hawking, MD  diazepam (VALIUM) 5 MG tablet Take 5 mg by mouth See admin instructions. Take one tablet (5 mg) by mouth twice daily - morning and bedtime, may also take one tablet (5 mg) in the afternoon as needed for anxiety   Yes [provider]  diltiazem (DILACOR XR) 180 MG 24 hr capsule Take 180 mg by mouth daily.  07/16/16  Yes [provider]  DULoxetine (CYMBALTA) 60 MG capsule Take 1 capsule (60 mg total) by mouth daily. Patient taking differently: Take 60 mg by mouth at bedtime.  09/25/17  Yes Marcial Pacas, MD  HYDROcodone-acetaminophen (NORCO/VICODIN) 5-325 MG tablet Take 1 tablet by mouth every 6 (six) hours as needed for moderate pain.   Yes  [provider]  MAGNESIUM PO Take 1 tablet by mouth daily before supper.   Yes [provider]  Menthol, Topical Analgesic, (ICY HOT EX) Apply 1 spray topically every 8 (eight) hours as needed (pain).   Yes [provider]  metFORMIN (GLUCOPHAGE) 500 MG tablet Take 500 mg by mouth 2 (two) times daily with a meal.    Yes [provider]  metoprolol tartrate (LOPRESSOR) 25 MG tablet Take 37.5 mg by mouth 2 (two) times daily with a meal.  01/31/18  Yes [provider]  Omega-3 Fatty Acids (FISH OIL PO) Take 1 capsule by mouth daily.   Yes [provider]  ondansetron (ZOFRAN) 4 MG tablet Take 4 mg by mouth at bedtime as needed for nausea or vomiting.  06/05/18  Yes [provider]  pantoprazole (PROTONIX) 40 MG tablet Take 40 mg by mouth 2 (two) times daily.  12/17/13   Yes [provider]  pravastatin (PRAVACHOL) 40 MG tablet Take 40 mg by mouth daily before supper.    Yes [provider]  ONE TOUCH ULTRA TEST test strip  09/12/16   [provider]    Family History Family History  Problem Relation Age of Onset  . Stroke Mother   . Hypertension Mother   . Kidney failure Father     Social History Social History   Tobacco Use  . Smoking status: Former Smoker    Packs/day: 1.50    Years: 56.00    Pack years: 84.00    Types: Cigarettes    Last attempt to quit: 07/07/2013    Years since quitting: 4.9  . Smokeless tobacco: Former Systems developer    Quit date: 02/25/2013  Substance Use Topics  . Alcohol use: No  . Drug use: No     Allergies   No known allergies   Review of Systems Review of Systems  Constitutional: Negative for chills and fever.  HENT: Negative for ear pain and sore throat.   Eyes: Negative for pain and visual disturbance.  Respiratory: Negative for cough and shortness of breath.   Cardiovascular: Positive for chest pain and syncope. Negative for palpitations.  Gastrointestinal: Negative for abdominal pain, nausea and vomiting.  Genitourinary: Negative for dysuria and hematuria.  Musculoskeletal: Negative for arthralgias and back pain.  Skin: Negative for color change and rash.  Neurological: Positive for weakness. Negative for focal weakness, seizures and syncope.  All other systems reviewed and are negative.    Physical Exam Updated Vital Signs BP (!) 123/104   Pulse 88   Temp 97.9 F (36.6 C) (Oral)   Resp 19   SpO2 (!) 83%   Physical Exam Vitals signs and nursing note reviewed.  Constitutional:      General: She is not in acute distress.    Appearance: She is well-developed.     Comments: Patient resting comfortably, no acute distress.  HENT:     Head: Normocephalic and atraumatic.  Eyes:     Conjunctiva/sclera: Conjunctivae normal.     Pupils: Pupils are equal, round, and reactive  to light.  Neck:     Musculoskeletal: Neck supple.  Cardiovascular:     Rate and Rhythm: Normal rate and regular rhythm.     Heart sounds: No murmur.  Pulmonary:     Effort: Pulmonary effort is normal. No respiratory distress.     Breath sounds: Normal breath sounds.  Abdominal:     General: There is no distension.     Palpations: Abdomen  is soft.     Tenderness: There is no abdominal tenderness.  Musculoskeletal: Normal range of motion.        General: No swelling or tenderness.  Skin:    General: Skin is warm and dry.     Capillary Refill: Capillary refill takes less than 2 seconds.  Neurological:     General: No focal deficit present.     Mental Status: She is alert and oriented to person, place, and time. Mental status is at baseline.     Cranial Nerves: Cranial nerve deficit present.     Sensory: No sensory deficit.     Motor: No weakness.     Coordination: Coordination normal.     Gait: Gait normal.     Deep Tendon Reflexes: Reflexes normal.     Comments: Patient has baseline left facial droop for the last 3 years which has not changed; 5 out of 5 strength bilateral upper and lower extremities, no sensory deficits or changes.  Normal reflexes.    Psychiatric:        Mood and Affect: Mood normal.      ED Treatments / Results  Labs (all labs ordered are listed, but only abnormal results are displayed) Labs Reviewed  COMPREHENSIVE METABOLIC PANEL - Abnormal; Notable for the following components:      Result Value   Sodium 132 (*)    Chloride 96 (*)    Glucose, Bld 131 (*)    BUN 30 (*)    Creatinine, Ser 1.32 (*)    Total Protein 6.4 (*)    AST 51 (*)    ALT 64 (*)    GFR calc non Af Amer 39 (*)    GFR calc Af Amer 45 (*)    All other components within normal limits  CBG MONITORING, ED - Abnormal; Notable for the following components:   Glucose-Capillary 106 (*)    All other components within normal limits  PROTIME-INR  APTT  CBC  DIFFERENTIAL  I-STAT  CREATININE, ED  I-STAT TROPONIN, ED  I-STAT TROPONIN, ED    EKG None  Radiology Ct Head Wo Contrast  Result Date: 06/16/2018 CLINICAL DATA:  Focal neurologic deficits with confusion and weakness EXAM: CT HEAD WITHOUT CONTRAST TECHNIQUE: Contiguous axial images were obtained from the base of the skull through the vertex without intravenous contrast. COMPARISON:  10/20/2015 FINDINGS: Brain: Diffuse moderate to marked small vessel ischemic disease of periventricular and subcortical white matter without acute hemorrhage, intra-axial mass nor extra-axial fluid collections. No midline shift or edema. No large vascular territory infarct. Global atrophy is stable. Vascular: Atherosclerosis of the carotid siphons. No hyperdense vessel sign. Skull: Mild osseous demineralization skull base without acute fracture or bone destruction. Sinuses/Orbits: No acute finding. Other: None IMPRESSION: Atrophy with chronic, largely stable degree of moderate to marked small vessel ischemic disease of periventricular and subcortical white matter. No acute intracranial abnormality. Electronically Signed   By: Ashley Royalty M.D.   On: 06/16/2018 18:14    Procedures Procedures (including critical care time)  Medications Ordered in ED Medications  sodium chloride flush (NS) 0.9 % injection 3 mL (3 mLs Intravenous Given 06/16/18 1944)  aspirin chewable tablet 324 mg (324 mg Oral Given 06/16/18 2000)  lactated ringers bolus 1,000 mL (0 mLs Intravenous Stopped 06/16/18 2148)     Initial Impression / Assessment and Plan / ED Course  I have reviewed the triage vital signs and the nursing notes.  Pertinent labs & imaging results that were available  during my care of the patient were reviewed by me and considered in my medical decision making (see chart for details).        78 year old female significant past medical history listed above who presents with increasing weakness as well as worsening ataxia over the last 3 days  according to herself and family.  Indicates that she is stumbling to the left as she is walking which is relatively new onset.  In addition patient today had a syncopal episode she was at the top of her staircase in her head leaned back as she rested against the staircase according to her son the patient was unconscious.  Patient then was awake afterwards without difficulty, no postictal state.  Patient also endorses some left sided chest pain nonradiating, pressure-like in nature this is been intermittent, denies current chest pain at this time.  Concern for arrhythmia, ACS, neurological injury as well as cardiopulmonary etiologies.  Will obtain laboratory studies as well as troponin, head CT as well as neurology consult.  Laboratory studies show elevated creatinine, AKI likely prerenal based on BUN elevation.  Will give fluids here in the emergency department.  CT scan shows chronic changes, no acute stroke or bleed at this time.  Neurology recommendations pending.  Discussed with inpatient team regarding admission for change in ADLs, syncope work-up as well as continued fluid hydration due to AKI.  They are in agreement with this plan.  Patient admitted in stable condition with stable vital signs.  Final Clinical Impressions(s) / ED Diagnoses   Final diagnoses:  Syncope, unspecified syncope type    ED Discharge Orders    None       Orson Aloe, MD 06/16/18 2211    Lacretia Leigh, MD 06/16/18 2252

## 2018-06-16 NOTE — ED Triage Notes (Signed)
Pt here for chest pain and confusion.  No family in triage but patient states her PCP called to have her come to the ED because her chest hurts and she is weak.  Weakness all over. VAN negative.  Pt states the weakness has been for weeks.

## 2018-06-16 NOTE — H&P (Addendum)
History and Physical    Caitlin Oliver SNK:[id number] DOB: [birth year] DOA: 06/16/2018  PCP: [name], PA-C  Patient coming from: Home.  Chief Complaint: Loss of consciousness.  Difficulty walking.  HPI: Caitlin Oliver is a 78 y.o. female with history of hypertension, hyperlipidemia, peripheral vascular disease status post bilateral carotid endarterectomy and lower extremity stent placement, depression, diabetes mellitus type 2 was brought to the ER after patient had a brief episode of syncope while walking to go to the PCPs office.  Over the last 3 days patient has been finding increasingly difficult to walk with ataxia and staggering towards the left side.  Also has been having some slurred speech.  Today while trying to walk patient states she fell onto her nephew and lost consciousness briefly.  He denies any incontinence of urine or seizure-like activity.  Patient was brought to the ER.  ED Course: The ER patient is found to have slurred speech.  Left facial droop.  Moves all extremities.  CT head is unremarkable.  Labs revealed acute renal failure for which patient was given fluid bolus.  Patient admitted for further work-up of possible CVA and syncope.  EKG shows normal sinus rhythm.  Review of Systems: As per HPI, rest all negative.   Past Medical History:  Diagnosis Date  . Anxiety   . Carotid artery disease ([place])   . Claudication ([place])   . COPD (chronic obstructive pulmonary disease) ([place])   . Diabetes mellitus   . Dizzy   . GERD (gastroesophageal reflux disease)   . Headache   . History of kidney stones   . Hypercholesteremia   . Hypertension   . 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 Shortness of breath    with exertion  . Stomach ulcer   . Tobacco abuse     Past Surgical History:  Procedure Laterality Date  . ABDOMINAL HYSTERECTOMY    . APPENDECTOMY    . BREAST REDUCTION SURGERY    . CAROTID ANGIOGRAM N/A  02/25/2013   Procedure: CAROTID ANGIOGRAM;  Surgeon: Lorretta Harp, MD;  Location: [facility] CATH LAB;  Service: Cardiovascular;  Laterality: N/A;  . ENDARTERECTOMY Right 03/06/2013   Procedure: ENDARTERECTOMY CAROTID-RIGHT;  Surgeon: Serafina Mitchell, MD;  Location: [place];  Service: Vascular;  Laterality: Right;  . ENDARTERECTOMY Left 05/07/2013   Procedure: LEFT CAROTID ARTERY ENDARTERECTOMY WITH VASCU-GUARD PATCH ANGIOPLASTY ;  Surgeon: Serafina Mitchell, MD;  Location: [place];  Service: Vascular;  Laterality: Left;  . LOBECTOMY Left 02/03/2016   Procedure: LEFT UPPER LOBECTOMY;  Surgeon: [name], MD;  Location: [place];  Service: Thoracic;  Laterality: Left;  . LOWER EXTREMITY ANGIOGRAM N/A 02/25/2013   Procedure: LOWER EXTREMITY ANGIOGRAM;  Surgeon: Lorretta Harp, MD;  Location[facility] LAB;  Service: Cardiovascular;  Laterality: N/A;  . LOWER EXTREMITY ANGIOGRAM N/A 07/27/2013   Procedure: LOWER EXTREMITY ANGIOGRAM;  Surgeon: Lorretta Harp, MD;  Location: [facility] CATH LAB;  Service: Cardiovascular;  Laterality: N/A;  . PATCH ANGIOPLASTY Right 03/06/2013   Procedure: PATCH ANGIOPLASTY of Right Carotid Artery using Vascu-Guard Patch;  Surgeon: Serafina Mitchell, MD;  Location: [place];  Service: Vascular;  Laterality: Right;  . PERIPHERAL VASCULAR CATHETERIZATION N/A 11/15/2015   Procedure: Aortic Arch Angiography;  Surgeon: Serafina Mitchell, MD;  Location: [place] CV LAB;  Service: Cardiovascular;  Laterality: N/A;  . PERIPHERAL VASCULAR CATHETERIZATION Bilateral 11/15/2015   Procedure: Carotid  Angiography;  Surgeon: Serafina Mitchell, MD;  Location: [place] CV LAB;  Service: Cardiovascular;  Laterality: Bilateral;  . PERIPHERAL VASCULAR CATHETERIZATION Left 11/15/2015   Procedure: Upper Extremity Angiography;  Surgeon: Serafina Mitchell, MD;  Location: [place] CV LAB;  Service: Cardiovascular;  Laterality: Left;  . PERIPHERAL VASCULAR CATHETERIZATION Left 11/15/2015   Procedure: Peripheral  Vascular Intervention;  Surgeon: Serafina Mitchell, MD;  Location: [place] CV LAB;  Service: Cardiovascular;  Laterality: Left;  subclavian   . SALIVARY GLAND SURGERY     scar tissue removed from left saliva glad  . TUBAL LIGATION    . VIDEO ASSISTED THORACOSCOPY (VATS)/WEDGE RESECTION Left 02/03/2016   Procedure: VIDEO ASSISTED THORACOSCOPY;  Surgeon: [name], MD;  Location: [place];  Service: Thoracic;  Laterality: Left;     reports that she quit smoking about 4 years ago. Her smoking use included cigarettes. She has a 84.00 pack-year smoking history. She quit smokeless tobacco use about 5 years ago. She reports that she does not drink alcohol or use drugs.  Allergies  Allergen Reactions  . No Known Allergies Other (See Comments)    Family History  Problem Relation Age of Onset  . Stroke Mother   . Hypertension Mother   . Kidney failure Father     Prior to Admission medications   Medication Sig Start Date End Date Taking? Authorizing Provider  albuterol (PROVENTIL HFA;VENTOLIN HFA) 108 (90 Base) MCG/ACT inhaler Inhale 2 puffs into the lungs every 6 (six) hours as needed for wheezing or shortness of breath.  07/11/17  Yes [provider]  aspirin EC 81 MG tablet Take 81 mg by mouth daily.   Yes [provider]  Aspirin-Acetaminophen-Caffeine (GOODY HEADACHE PO) Take 1 packet by mouth 2 (two) times daily as needed (headache).   Yes [provider]  BIOTIN 5000 PO Take 5,000 mg by mouth daily before supper.    Yes [provider]  calcium carbonate (CALCIUM 600) 600 MG TABS tablet Take 600 mg by mouth daily before supper.    Yes [provider]  cyclobenzaprine (FLEXERIL) 5 MG tablet Take 1 tablet (5 mg total) by mouth 3 (three) times daily as needed for muscle spasms. 07/16/17  Yes Rosalin Hawking, MD  diazepam (VALIUM) 5 MG tablet Take 5 mg by mouth See admin instructions. Take one tablet (5 mg) by mouth twice daily - morning and  bedtime, may also take one tablet (5 mg) in the afternoon as needed for anxiety   Yes [provider]  diltiazem (DILACOR XR) 180 MG 24 hr capsule Take 180 mg by mouth daily.  07/16/16  Yes [provider]  DULoxetine (CYMBALTA) 60 MG capsule Take 1 capsule (60 mg total) by mouth daily. Patient taking differently: Take 60 mg by mouth at bedtime.  09/25/17  Yes Marcial Pacas, MD  HYDROcodone-acetaminophen (NORCO/VICODIN) 5-325 MG tablet Take 1 tablet by mouth every 6 (six) hours as needed for moderate pain.   Yes [provider]  MAGNESIUM PO Take 1 tablet by mouth daily before supper.   Yes [provider]  Menthol, Topical Analgesic, (ICY HOT EX) Apply 1 spray topically every 8 (eight) hours as needed (pain).   Yes [provider]  metFORMIN (GLUCOPHAGE) 500 MG tablet Take 500 mg by mouth 2 (two) times daily with a meal.    Yes [provider]  metoprolol tartrate (LOPRESSOR) 25 MG tablet Take 37.5 mg by mouth 2 (two) times daily with  a meal.  01/31/18  Yes [provider]  Omega-3 Fatty Acids (FISH OIL PO) Take 1 capsule by mouth daily.   Yes [provider]  ondansetron (ZOFRAN) 4 MG tablet Take 4 mg by mouth at bedtime as needed for nausea or vomiting.  06/05/18  Yes [provider]  pantoprazole (PROTONIX) 40 MG tablet Take 40 mg by mouth 2 (two) times daily.  12/17/13  Yes [provider]  pravastatin (PRAVACHOL) 40 MG tablet Take 40 mg by mouth daily before supper.    Yes [provider]  ONE TOUCH ULTRA TEST test strip  09/12/16   [provider]    Physical Exam: Vitals:   06/16/18 1732 06/16/18 2000 06/16/18 2015  BP: (!) 124/94 (!) 158/113 (!) 123/104  Pulse: 100 88   Resp: _0 Temp: 97.9 F (36.6 C)    TempSrc: Oral    SpO2: 100% (!) 83%       Constitutional: Moderately built and nourished. Vitals:   06/16/18 1732 06/16/18 2000 06/16/18 2015  BP: (!) 124/94 (!) 158/113  (!) 123/104  Pulse: 100 88   Resp: _1 Temp: 97.9 F (36.6 C)    TempSrc: Oral    SpO2: 100% (!) 83%    Eyes: Anicteric no pallor. ENMT: No discharge from the ears eyes nose or mouth. Neck: No mass felt.  No neck rigidity but no JVD appreciated. Respiratory: No rhonchi or crepitations. Cardiovascular: S1-S2 heard. Abdomen: Soft nontender bowel sounds present. Musculoskeletal: No edema.  No joint effusion. Skin: No rash. Neurologic: Alert awake oriented to time place and person.  Moves all extremities 5 x 5.  No facial asymmetry tongue is midline pupils equal reacting to light. Psychiatric: Appears normal per normal affect.   Labs on Admission: I have personally reviewed following labs and imaging studies  CBC: Recent Labs  Lab 06/16/18 1743  WBC 9.9  NEUTROABS 7.6  HGB 13.3  HCT 43.4  MCV 91.4  PLT 287   Basic Metabolic Panel: Recent Labs  Lab 06/16/18 1743 06/16/18 1759  NA 132*  --   K 5.1  --   CL 96*  --   CO2 23  --   GLUCOSE 131*  --   BUN 30*  --   CREATININE 1.32* 1.00  CALCIUM 9.4  --    GFR: CrCl cannot be calculated (Unknown ideal weight.). Liver Function Tests: Recent Labs  Lab 06/16/18 1743  AST 51*  ALT 64*  ALKPHOS 70  BILITOT 0.7  PROT 6.4*  ALBUMIN 3.5   No results for input(s): LIPASE, AMYLASE in the last 168 hours. No results for input(s): AMMONIA in the last 168 hours. Coagulation Profile: Recent Labs  Lab 06/16/18 1743  INR 1.1   Cardiac Enzymes: No results for input(s): CKTOTAL, CKMB, CKMBINDEX, TROPONINI in the last 168 hours. BNP (last 3 results) No results for input(s): PROBNP in the last 8760 hours. HbA1C: No results for input(s): HGBA1C in the last 72 hours. CBG: Recent Labs  Lab 06/16/18 1937  GLUCAP 106*   Lipid Profile: No results for input(s): CHOL, HDL, LDLCALC, TRIG, CHOLHDL, LDLDIRECT in the last 72 hours. Thyroid Function Tests: No results for input(s): TSH, T4TOTAL, FREET4, T3FREE, THYROIDAB  in the last 72 hours. Anemia Panel: No results for input(s): VITAMINB12, FOLATE, FERRITIN, TIBC, IRON, RETICCTPCT in the last 72 hours. Urine analysis:    Component Value Date/Time   COLORURINE AMBER (A) 02/01/2016 [place]  02/01/2016 1316   LABSPEC 1.020 02/01/2016 1316   PHURINE 5.5 02/01/2016 1316   GLUCOSEU NEGATIVE 02/01/2016 1316   HGBUR NEGATIVE 02/01/2016 1316   BILIRUBINUR NEGATIVE 02/01/2016 1316   KETONESUR NEGATIVE 02/01/2016 1316   PROTEINUR NEGATIVE 02/01/2016 1316   UROBILINOGEN 0.2 04/30/2013 1337   NITRITE NEGATIVE 02/01/2016 1316   LEUKOCYTESUR TRACE (A) 02/01/2016 1316   Sepsis Labs: _0 (procalcitonin:4,lacticidven:4) )No results found for this or any previous visit (from the past 240 hour(s)).   Radiological Exams on Admission: Ct Head Wo Contrast  Result Date: 06/16/2018 CLINICAL DATA:  Focal neurologic deficits with confusion and weakness EXAM: CT HEAD WITHOUT CONTRAST TECHNIQUE: Contiguous axial images were obtained from the base of the skull through the vertex without intravenous contrast. COMPARISON:  10/20/2015 FINDINGS: Brain: Diffuse moderate to marked small vessel ischemic disease of periventricular and subcortical white matter without acute hemorrhage, intra-axial mass nor extra-axial fluid collections. No midline shift or edema. No large vascular territory infarct. Global atrophy is stable. Vascular: Atherosclerosis of the carotid siphons. No hyperdense vessel sign. Skull: Mild osseous demineralization skull base without acute fracture or bone destruction. Sinuses/Orbits: No acute finding. Other: None IMPRESSION: Atrophy with chronic, largely stable degree of moderate to marked small vessel ischemic disease of periventricular and subcortical white matter. No acute intracranial abnormality. Electronically Signed   By: Ashley Royalty M.D.   On: 06/16/2018 18:14    EKG: Independently reviewed.  Normal sinus  rhythm.  Assessment/Plan Principal Problem:   Syncope Active Problems:   Essential hypertension   Type 2 diabetes mellitus (HCC)   PVD (peripheral vascular disease) with claudication (HCC)   COPD   Cancer of left lung (HCC)   Chest pain    1. Slurred speech with left facial droop and ataxia concerning for acute CVA -appreciate neurology consult.  MRI brain CT angiogram of the head and neck neurochecks hemoglobin A1c lipid panel 2D echo has been ordered.  Patient on aspirin Plavix.  Patient also on statins.  Patient symptoms have been ongoing for last 3 days.  Patient passed swallow.  Physical therapy consult. 2. Syncope -cause not clear.  Need to check orthostatics in the morning.  Patient received fluid for acute renal failure.  Closely monitor in telemetry for any arrhythmias.  Check 2D echo.  3. Acute renal failure has received fluid bolus in the ER follow metabolic panel. 4. Diabetes mellitus type 2 we will keep patient on sliding scale coverage follow hemoglobin A1c. 5. Hypertension on Cardizem and metoprolol. 6. History of lung cancer status post lobectomy. 7. Hyperlipidemia on statins. 8. Peripheral vascular disease status post bilateral carotid endarterectomy and lower extremity stent placement.   DVT prophylaxis: Lovenox. Code Status: Full code. Family Communication: No family at the bedside. Disposition Plan: Home. Consults called: Neurology. Admission status: Observation.   Rise Patience MD [facility] Pager [phone].  If 7PM-7AM, please contact night-coverage [email or web address] Password [facility]  06/16/2018, 10:43 PM

## 2018-06-17 ENCOUNTER — Observation Stay (HOSPITAL_COMMUNITY): Payer: PPO

## 2018-06-17 ENCOUNTER — Inpatient Hospital Stay (HOSPITAL_COMMUNITY): Payer: PPO

## 2018-06-17 ENCOUNTER — Other Ambulatory Visit: Payer: Self-pay | Admitting: Cardiovascular Disease

## 2018-06-17 ENCOUNTER — Encounter (HOSPITAL_COMMUNITY): Payer: Self-pay | Admitting: Radiology

## 2018-06-17 ENCOUNTER — Observation Stay (HOSPITAL_BASED_OUTPATIENT_CLINIC_OR_DEPARTMENT_OTHER): Payer: PPO

## 2018-06-17 DIAGNOSIS — I639 Cerebral infarction, unspecified: Secondary | ICD-10-CM

## 2018-06-17 DIAGNOSIS — I2723 Pulmonary hypertension due to lung diseases and hypoxia: Secondary | ICD-10-CM | POA: Diagnosis present

## 2018-06-17 DIAGNOSIS — G4733 Obstructive sleep apnea (adult) (pediatric): Secondary | ICD-10-CM | POA: Diagnosis present

## 2018-06-17 DIAGNOSIS — E669 Obesity, unspecified: Secondary | ICD-10-CM | POA: Diagnosis present

## 2018-06-17 DIAGNOSIS — F419 Anxiety disorder, unspecified: Secondary | ICD-10-CM | POA: Diagnosis present

## 2018-06-17 DIAGNOSIS — I6389 Other cerebral infarction: Secondary | ICD-10-CM | POA: Diagnosis present

## 2018-06-17 DIAGNOSIS — R270 Ataxia, unspecified: Secondary | ICD-10-CM | POA: Diagnosis present

## 2018-06-17 DIAGNOSIS — I5032 Chronic diastolic (congestive) heart failure: Secondary | ICD-10-CM | POA: Diagnosis present

## 2018-06-17 DIAGNOSIS — E1151 Type 2 diabetes mellitus with diabetic peripheral angiopathy without gangrene: Secondary | ICD-10-CM | POA: Diagnosis present

## 2018-06-17 DIAGNOSIS — I110 Hypertensive heart disease with heart failure: Secondary | ICD-10-CM | POA: Diagnosis present

## 2018-06-17 DIAGNOSIS — K219 Gastro-esophageal reflux disease without esophagitis: Secondary | ICD-10-CM | POA: Diagnosis present

## 2018-06-17 DIAGNOSIS — I708 Atherosclerosis of other arteries: Secondary | ICD-10-CM | POA: Diagnosis present

## 2018-06-17 DIAGNOSIS — R55 Syncope and collapse: Secondary | ICD-10-CM | POA: Diagnosis present

## 2018-06-17 DIAGNOSIS — E7800 Pure hypercholesterolemia, unspecified: Secondary | ICD-10-CM | POA: Diagnosis present

## 2018-06-17 DIAGNOSIS — I672 Cerebral atherosclerosis: Secondary | ICD-10-CM | POA: Diagnosis present

## 2018-06-17 DIAGNOSIS — D696 Thrombocytopenia, unspecified: Secondary | ICD-10-CM | POA: Diagnosis present

## 2018-06-17 DIAGNOSIS — E1165 Type 2 diabetes mellitus with hyperglycemia: Secondary | ICD-10-CM | POA: Diagnosis present

## 2018-06-17 DIAGNOSIS — E785 Hyperlipidemia, unspecified: Secondary | ICD-10-CM | POA: Diagnosis present

## 2018-06-17 DIAGNOSIS — J439 Emphysema, unspecified: Secondary | ICD-10-CM | POA: Diagnosis present

## 2018-06-17 DIAGNOSIS — I361 Nonrheumatic tricuspid (valve) insufficiency: Secondary | ICD-10-CM

## 2018-06-17 DIAGNOSIS — Z6833 Body mass index (BMI) 33.0-33.9, adult: Secondary | ICD-10-CM | POA: Diagnosis not present

## 2018-06-17 DIAGNOSIS — N179 Acute kidney failure, unspecified: Secondary | ICD-10-CM | POA: Diagnosis present

## 2018-06-17 DIAGNOSIS — E041 Nontoxic single thyroid nodule: Secondary | ICD-10-CM | POA: Diagnosis present

## 2018-06-17 DIAGNOSIS — E860 Dehydration: Secondary | ICD-10-CM | POA: Diagnosis present

## 2018-06-17 DIAGNOSIS — R4781 Slurred speech: Secondary | ICD-10-CM | POA: Diagnosis present

## 2018-06-17 DIAGNOSIS — I69392 Facial weakness following cerebral infarction: Secondary | ICD-10-CM | POA: Diagnosis not present

## 2018-06-17 DIAGNOSIS — E871 Hypo-osmolality and hyponatremia: Secondary | ICD-10-CM | POA: Diagnosis present

## 2018-06-17 DIAGNOSIS — I82409 Acute embolism and thrombosis of unspecified deep veins of unspecified lower extremity: Secondary | ICD-10-CM | POA: Diagnosis not present

## 2018-06-17 DIAGNOSIS — I63233 Cerebral infarction due to unspecified occlusion or stenosis of bilateral carotid arteries: Secondary | ICD-10-CM | POA: Diagnosis not present

## 2018-06-17 DIAGNOSIS — I739 Peripheral vascular disease, unspecified: Secondary | ICD-10-CM

## 2018-06-17 LAB — COMPREHENSIVE METABOLIC PANEL
ALT: 63 U/L — ABNORMAL HIGH (ref 0–44)
AST: 48 U/L — ABNORMAL HIGH (ref 15–41)
Albumin: 3.5 g/dL (ref 3.5–5.0)
Alkaline Phosphatase: 67 U/L (ref 38–126)
Anion gap: 13 (ref 5–15)
BUN: 24 mg/dL — ABNORMAL HIGH (ref 8–23)
CO2: 26 mmol/L (ref 22–32)
CREATININE: 0.88 mg/dL (ref 0.44–1.00)
Calcium: 9 mg/dL (ref 8.9–10.3)
Chloride: 94 mmol/L — ABNORMAL LOW (ref 98–111)
GFR calc Af Amer: >60 mL/min (ref >60)
GFR calc non Af Amer: >60 mL/min (ref >60)
Glucose, Bld: 117 mg/dL — ABNORMAL HIGH (ref 70–99)
Potassium: 4.7 mmol/L (ref 3.5–5.1)
Sodium: 133 mmol/L — ABNORMAL LOW (ref 135–145)
Total Bilirubin: 0.9 mg/dL (ref 0.3–1.2)
Total Protein: 6.4 g/dL — ABNORMAL LOW (ref 6.5–8.1)

## 2018-06-17 LAB — T4, FREE: FREE T4: 1.13 ng/dL (ref 0.82–1.77)

## 2018-06-17 LAB — CBC WITH DIFFERENTIAL/PLATELET
Abs Immature Granulocytes: 0.04 10*3/uL (ref 0.00–0.07)
BASOS ABS: 0 10*3/uL (ref 0.0–0.1)
Basophils Relative: 1 %
EOS PCT: 1 %
Eosinophils Absolute: 0.1 10*3/uL (ref 0.0–0.5)
HCT: 43.2 % (ref 36.0–46.0)
Hemoglobin: 13.2 g/dL (ref 12.0–15.0)
Immature Granulocytes: 1 %
Lymphocytes Relative: 14 %
Lymphs Abs: 1 10*3/uL (ref 0.7–4.0)
MCH: 27.7 pg (ref 26.0–34.0)
MCHC: 30.6 g/dL (ref 30.0–36.0)
MCV: 90.6 fL (ref 80.0–100.0)
Monocytes Absolute: 0.6 10*3/uL (ref 0.1–1.0)
Monocytes Relative: 8 %
Neutro Abs: 5.6 10*3/uL (ref 1.7–7.7)
Neutrophils Relative %: 75 %
Platelets: 245 10*3/uL (ref 150–400)
RBC: 4.77 MIL/uL (ref 3.87–5.11)
RDW: 13.7 % (ref 11.5–15.5)
WBC: 7.3 10*3/uL (ref 4.0–10.5)
nRBC: 0 % (ref 0.0–0.2)

## 2018-06-17 LAB — LIPID PANEL
CHOL/HDL RATIO: 3.2 ratio
Cholesterol: 99 mg/dL (ref 0–200)
HDL: 31 mg/dL — ABNORMAL LOW (ref >40)
LDL Cholesterol: 44 mg/dL (ref 0–99)
TRIGLYCERIDES: 122 mg/dL (ref <150)
VLDL: 24 mg/dL (ref 0–40)

## 2018-06-17 LAB — CREATININE, SERUM
Creatinine, Ser: 1.01 mg/dL — ABNORMAL HIGH (ref 0.44–1.00)
GFR calc Af Amer: >60 mL/min (ref >60)
GFR calc non Af Amer: 54 mL/min — ABNORMAL LOW (ref >60)

## 2018-06-17 LAB — GLUCOSE, CAPILLARY
Glucose-Capillary: 101 mg/dL — ABNORMAL HIGH (ref 70–99)
Glucose-Capillary: 231 mg/dL — ABNORMAL HIGH (ref 70–99)
Glucose-Capillary: 126 mg/dL — ABNORMAL HIGH (ref 70–99)
Glucose-Capillary: 141 mg/dL — ABNORMAL HIGH (ref 70–99)

## 2018-06-17 LAB — TSH
TSH: 2.653 u[IU]/mL (ref 0.350–4.500)
TSH: 1.62 u[IU]/mL (ref 0.350–4.500)

## 2018-06-17 LAB — HEMOGLOBIN A1C
Hgb A1c MFr Bld: 7.9 % — ABNORMAL HIGH (ref 4.8–5.6)
MEAN PLASMA GLUCOSE: 180.03 mg/dL

## 2018-06-17 LAB — CBC
HCT: 43.9 % (ref 36.0–46.0)
HEMOGLOBIN: 13.7 g/dL (ref 12.0–15.0)
MCH: 28.8 pg (ref 26.0–34.0)
MCHC: 31.2 g/dL (ref 30.0–36.0)
MCV: 92.4 fL (ref 80.0–100.0)
Platelets: 124 10*3/uL — ABNORMAL LOW (ref 150–400)
RBC: 4.75 MIL/uL (ref 3.87–5.11)
RDW: 13.8 % (ref 11.5–15.5)
WBC: 9.4 10*3/uL (ref 4.0–10.5)
nRBC: 0 % (ref 0.0–0.2)

## 2018-06-17 LAB — TROPONIN I
Troponin I: <0.03 ng/mL (ref <0.03)
Troponin I: <0.03 ng/mL (ref <0.03)
Troponin I: <0.03 ng/mL (ref <0.03)

## 2018-06-17 LAB — ECHOCARDIOGRAM COMPLETE: Weight: 3079.39 oz

## 2018-06-17 LAB — D-DIMER, QUANTITATIVE: D-Dimer, Quant: 0.86 ug/mL-FEU — ABNORMAL HIGH (ref 0.00–0.50)

## 2018-06-17 MED ORDER — ASPIRIN 325 MG PO TABS
325.0000 mg | ORAL_TABLET | Freq: Every day | ORAL | Status: DC
  Administered 2018-06-17: 325 mg via ORAL
  Filled 2018-06-17: qty 1

## 2018-06-17 MED ORDER — IOPAMIDOL (ISOVUE-370) INJECTION 76%
INTRAVENOUS | Status: AC
  Filled 2018-06-17: qty 100

## 2018-06-17 MED ORDER — ASPIRIN 300 MG RE SUPP: 300.0000 mg | Freq: Every day | RECTAL | Status: DC

## 2018-06-17 MED ORDER — IOPAMIDOL (ISOVUE-370) INJECTION 76%
75.0000 mL | Freq: Once | INTRAVENOUS | Status: AC | PRN
  Administered 2018-06-17: 75 mL via INTRAVENOUS

## 2018-06-17 MED ORDER — STROKE: EARLY STAGES OF RECOVERY BOOK
Freq: Once | Status: AC
  Administered 2018-06-17: 05:00:00

## 2018-06-17 MED ORDER — INSULIN STARTER KIT- PEN NEEDLES (ENGLISH): 1.0000 | Freq: Once | Status: DC

## 2018-06-17 MED ORDER — ASPIRIN EC 81 MG PO TBEC
81.0000 mg | DELAYED_RELEASE_TABLET | Freq: Every day | ORAL | Status: DC
  Administered 2018-06-18 – 2018-06-20 (×3): 81 mg via ORAL
  Filled 2018-06-17 (×3): qty 1

## 2018-06-17 MED ORDER — CLOPIDOGREL BISULFATE 75 MG PO TABS
300.0000 mg | ORAL_TABLET | Freq: Once | ORAL | Status: AC
  Administered 2018-06-17: 300 mg via ORAL
  Filled 2018-06-17: qty 4

## 2018-06-17 MED ORDER — PERFLUTREN LIPID MICROSPHERE
4.0000 mL | Freq: Once | INTRAVENOUS | Status: AC
  Administered 2018-06-17: 4 mL via INTRAVENOUS

## 2018-06-17 MED ORDER — HYDRALAZINE HCL 20 MG/ML IJ SOLN: 10.0000 mg | Freq: Four times a day (QID) | INTRAMUSCULAR | Status: DC | PRN

## 2018-06-17 MED ORDER — PERFLUTREN LIPID MICROSPHERE
INTRAVENOUS | Status: AC
  Filled 2018-06-17: qty 10

## 2018-06-17 MED ORDER — CLOPIDOGREL BISULFATE 75 MG PO TABS
75.0000 mg | ORAL_TABLET | Freq: Every day | ORAL | Status: DC
  Administered 2018-06-18 – 2018-06-20 (×3): 75 mg via ORAL
  Filled 2018-06-17 (×3): qty 1

## 2018-06-17 MED ORDER — LIVING WELL WITH DIABETES BOOK
Freq: Once | Status: AC
  Administered 2018-06-17: 13:00:00

## 2018-06-17 MED ORDER — LACTATED RINGERS IV SOLN
INTRAVENOUS | Status: AC
  Administered 2018-06-17 – 2018-06-18 (×2): via INTRAVENOUS

## 2018-06-17 NOTE — Progress Notes (Addendum)
STROKE TEAM PROGRESS NOTE   INTERVAL HISTORY I have reviewed history of present illness the patient. She had a syncopal event and MRI scan shows a tiny punctate rightinfarct in the right frontal precentral gyrus. She has prior history of carotid surgery and endarterectomy and CT angiogram shows 65-70% stenosis of the right common carotid artery which possibly could be symptomatic and needs further evaluation with carotid ultrasound   Vitals:   06/17/18 0408 06/17/18 0420 06/17/18 0618 06/17/18 0619  BP:  (!) 161/109 (!) 209/116 (!) 162/113  Pulse:  94 94 95  Resp:  20 18   Temp:  97.6 F (36.4 C) 98.3 F (36.8 C)   TempSrc:  Oral    SpO2:  96% 97% 100%  Weight: 87.3 kg       CBC:  Recent Labs  Lab 06/16/18 1743 06/16/18 2314 06/17/18 0430  WBC 9.9 9.4 7.3  NEUTROABS 7.6  --  5.6  HGB 13.3 13.7 13.2  HCT 43.4 43.9 43.2  MCV 91.4 92.4 90.6  PLT 300 124* 737    Basic Metabolic Panel:  Recent Labs  Lab 06/16/18 1743  06/16/18 2314 06/17/18 0430  NA 132*  --   --  133*  K 5.1  --   --  4.7  CL 96*  --   --  94*  CO2 23  --   --  26  GLUCOSE 131*  --   --  117*  BUN 30*  --   --  24*  CREATININE 1.32*   < > 1.01* 0.88  CALCIUM 9.4  --   --  9.0   < > = values in this interval not displayed.   Lipid Panel:     Component Value Date/Time   CHOL 99 06/17/2018 0430   TRIG 122 06/17/2018 0430   HDL 31 (L) 06/17/2018 0430   CHOLHDL 3.2 06/17/2018 0430   VLDL 24 06/17/2018 0430   LDLCALC 44 06/17/2018 0430   HgbA1c:  Lab Results  Component Value Date   HGBA1C 7.9 (H) 06/17/2018   Urine Drug Screen: No results found for: LABOPIA, COCAINSCRNUR, LABBENZ, AMPHETMU, THCU, LABBARB  Alcohol Level No results found for: ETH  IMAGING Ct Angio Head W Or Wo Contrast  Result Date: 06/17/2018 CLINICAL DATA:  Follow-up examination for acute stroke. EXAM: CT ANGIOGRAPHY HEAD AND NECK TECHNIQUE: Multidetector CT imaging of the head and neck was performed using the standard  protocol during bolus administration of intravenous contrast. Multiplanar CT image reconstructions and MIPs were obtained to evaluate the vascular anatomy. Carotid stenosis measurements (when applicable) are obtained utilizing NASCET criteria, using the distal internal carotid diameter as the denominator. CONTRAST:  21m ISOVUE-370 IOPAMIDOL (ISOVUE-370) INJECTION 76% COMPARISON:  Prior MRI from earlier same day. FINDINGS: CTA NECK FINDINGS Aortic arch: Visualized aortic arch of normal caliber with normal branch pattern. Heavy atheromatous plaque about the visualized arch and origin of the great vessels. Associated stenosis of up to 60% at the right brachiocephalic artery. Narrowing short-segment severe approximate 80% stenosis at the proximal left common carotid artery (series 8, image 297). Heavy atheromatous plaque at the origin of the left subclavian artery with left subclavian stent in place. Widely patent flow through the stent without significant intra stent stenosis. Additional approximate 50% stenosis at the mid left subclavian artery (series 8, image 267). Focal stenosis of up to approximate 75% at the origin of the right subclavian artery. Right carotid system: Atheromatous plaque at the origin of the right common carotid  artery with associated stenosis of up to approximate 60-70% by NASCET criteria. Additional scattered plaque within the right CCA distally with scattered mild multifocal narrowing. Prior right carotid endarterectomy without significant residual stenosis. Right ICA widely patent at its proximal and mid aspect. Atheromatous plaque at the distal right ICA with associated stenosis of up to 70% by NASCET criteria (series 8, image 146). No acute vascular abnormality within the right carotid artery system. Left carotid system: Focal 80% stenosis at the proximal left common carotid artery again noted. Additional scattered plaque within the mid and distal left CCA with up to approximate 50%  stenosis by NASCET criteria. Prior left carotid endarterectomy with residual mild 20% narrowing of the proximal left ICA. Left ICA otherwise widely patent distally without stenosis or acute vascular abnormality. Short-segment fenestration versus chronic dissection flap noted at the proximal left external carotid artery (series 8, image 194). Vertebral arteries: Both vertebral arteries arise from the subclavian arteries. Left vertebral artery dominant and widely patent within the neck. Markedly attenuated flow within the right vertebral artery, most likely related to the previously identified high-grade proximal right subclavian artery stenosis. Right vertebral artery otherwise grossly patent without appreciable stenosis. Skeleton: No acute osseous abnormality. No discrete lytic or blastic osseous lesions. Degenerative spondylolysis noted at C5-6. Other neck: No acute soft tissue abnormality within the neck. No adenopathy. Heterogeneous left thyroid nodule measures up to approximately 3.2 cm. Upper chest: Partially visualized upper chest demonstrates no acute finding. Centrilobular and paraseptal emphysematous changes with superimposed atelectasis noted. Pleuroparenchymal thickening about the partially visualized left lung suspected to at least in part be postoperative related to prior lobectomy. Review of the MIP images confirms the above findings CTA HEAD FINDINGS Anterior circulation: Petrous segments widely patent. Scattered atheromatous plaque within the cavernous/supraclinoid ICAs with resultant mild to moderate diffuse narrowing bilaterally (no more than 50%). ICA termini well perfused. A1 segments widely patent. Normal anterior communicating artery. Anterior cerebral arteries widely patent to their distal aspects. No M1 stenosis or occlusion. Distal MCA branches well perfused and symmetric. Posterior circulation: Vertebral arteries widely patent to the vertebrobasilar junction without stenosis. Adequate  opacification of the right V4 segment likely at least partially retrograde in nature across the vertebrobasilar junction. Posterior inferior cerebral arteries patent bilaterally. Basilar widely patent proximally, with mild narrowing at the basilar tip. Superior cerebral arteries patent bilaterally. Probable duplicated right SCA noted. Both of the posterior cerebral arteries primarily supplied via the basilar. PCAs widely patent to their distal aspects. Small bilateral posterior communicating arteries noted. Venous sinuses: Patent. Anatomic variants: None significant. Delayed phase: No abnormal enhancement. Review of the MIP images confirms the above findings IMPRESSION: 1. Negative CTA for large vessel occlusion. 2. Severe atheromatous disease about the aortic arch and origin of the great vessels. Resultant stenoses of up to 65% at the right brachiocephalic artery, 65% at the proximal left common carotid artery, 60-70% at the origin of the right common carotid artery and 75% at the proximal right subclavian artery. Vascular stent in place at the origin of the left subclavian artery with widely patent flow. 3. Sequelae of prior bilateral CEAs without significant residual stenosis. Atheromatous stenosis of up to 70% at the distal right ICA. No other significant stenosis about the left ICA. 4. Relative sparing of the posterior circulation with wide patency of the vertebrobasilar system. Attenuated flow within the right vertebral artery likely related to the above-mentioned proximal right subclavian artery stenosis. 5. 3.2 cm left thyroid if not already performed. Dedicated nonemergent  thyroid ultrasound recommended 6. Emphysema with postoperative changes within the partially visualized left lung. Electronically Signed   By: Jeannine Boga M.D.   On: 06/17/2018 04:34   Ct Head Wo Contrast  Result Date: 06/16/2018 CLINICAL DATA:  Focal neurologic deficits with confusion and weakness EXAM: CT HEAD WITHOUT  CONTRAST TECHNIQUE: Contiguous axial images were obtained from the base of the skull through the vertex without intravenous contrast. COMPARISON:  10/20/2015 FINDINGS: Brain: Diffuse moderate to marked small vessel ischemic disease of periventricular and subcortical white matter without acute hemorrhage, intra-axial mass nor extra-axial fluid collections. No midline shift or edema. No large vascular territory infarct. Global atrophy is stable. Vascular: Atherosclerosis of the carotid siphons. No hyperdense vessel sign. Skull: Mild osseous demineralization skull base without acute fracture or bone destruction. Sinuses/Orbits: No acute finding. Other: None IMPRESSION: Atrophy with chronic, largely stable degree of moderate to marked small vessel ischemic disease of periventricular and subcortical white matter. No acute intracranial abnormality. Electronically Signed   By: Ashley Royalty M.D.   On: 06/16/2018 18:14   Ct Angio Neck W Or Wo Contrast  Result Date: 06/17/2018 CLINICAL DATA:  Follow-up examination for acute stroke. EXAM: CT ANGIOGRAPHY HEAD AND NECK TECHNIQUE: Multidetector CT imaging of the head and neck was performed using the standard protocol during bolus administration of intravenous contrast. Multiplanar CT image reconstructions and MIPs were obtained to evaluate the vascular anatomy. Carotid stenosis measurements (when applicable) are obtained utilizing NASCET criteria, using the distal internal carotid diameter as the denominator. CONTRAST:  73m ISOVUE-370 IOPAMIDOL (ISOVUE-370) INJECTION 76% COMPARISON:  Prior MRI from earlier same day. FINDINGS: CTA NECK FINDINGS Aortic arch: Visualized aortic arch of normal caliber with normal branch pattern. Heavy atheromatous plaque about the visualized arch and origin of the great vessels. Associated stenosis of up to 60% at the right brachiocephalic artery. Narrowing short-segment severe approximate 80% stenosis at the proximal left common carotid artery  (series 8, image 297). Heavy atheromatous plaque at the origin of the left subclavian artery with left subclavian stent in place. Widely patent flow through the stent without significant intra stent stenosis. Additional approximate 50% stenosis at the mid left subclavian artery (series 8, image 267). Focal stenosis of up to approximate 75% at the origin of the right subclavian artery. Right carotid system: Atheromatous plaque at the origin of the right common carotid artery with associated stenosis of up to approximate 60-70% by NASCET criteria. Additional scattered plaque within the right CCA distally with scattered mild multifocal narrowing. Prior right carotid endarterectomy without significant residual stenosis. Right ICA widely patent at its proximal and mid aspect. Atheromatous plaque at the distal right ICA with associated stenosis of up to 70% by NASCET criteria (series 8, image 146). No acute vascular abnormality within the right carotid artery system. Left carotid system: Focal 80% stenosis at the proximal left common carotid artery again noted. Additional scattered plaque within the mid and distal left CCA with up to approximate 50% stenosis by NASCET criteria. Prior left carotid endarterectomy with residual mild 20% narrowing of the proximal left ICA. Left ICA otherwise widely patent distally without stenosis or acute vascular abnormality. Short-segment fenestration versus chronic dissection flap noted at the proximal left external carotid artery (series 8, image 194). Vertebral arteries: Both vertebral arteries arise from the subclavian arteries. Left vertebral artery dominant and widely patent within the neck. Markedly attenuated flow within the right vertebral artery, most likely related to the previously identified high-grade proximal right subclavian artery stenosis. Right vertebral  artery otherwise grossly patent without appreciable stenosis. Skeleton: No acute osseous abnormality. No discrete  lytic or blastic osseous lesions. Degenerative spondylolysis noted at C5-6. Other neck: No acute soft tissue abnormality within the neck. No adenopathy. Heterogeneous left thyroid nodule measures up to approximately 3.2 cm. Upper chest: Partially visualized upper chest demonstrates no acute finding. Centrilobular and paraseptal emphysematous changes with superimposed atelectasis noted. Pleuroparenchymal thickening about the partially visualized left lung suspected to at least in part be postoperative related to prior lobectomy. Review of the MIP images confirms the above findings CTA HEAD FINDINGS Anterior circulation: Petrous segments widely patent. Scattered atheromatous plaque within the cavernous/supraclinoid ICAs with resultant mild to moderate diffuse narrowing bilaterally (no more than 50%). ICA termini well perfused. A1 segments widely patent. Normal anterior communicating artery. Anterior cerebral arteries widely patent to their distal aspects. No M1 stenosis or occlusion. Distal MCA branches well perfused and symmetric. Posterior circulation: Vertebral arteries widely patent to the vertebrobasilar junction without stenosis. Adequate opacification of the right V4 segment likely at least partially retrograde in nature across the vertebrobasilar junction. Posterior inferior cerebral arteries patent bilaterally. Basilar widely patent proximally, with mild narrowing at the basilar tip. Superior cerebral arteries patent bilaterally. Probable duplicated right SCA noted. Both of the posterior cerebral arteries primarily supplied via the basilar. PCAs widely patent to their distal aspects. Small bilateral posterior communicating arteries noted. Venous sinuses: Patent. Anatomic variants: None significant. Delayed phase: No abnormal enhancement. Review of the MIP images confirms the above findings IMPRESSION: 1. Negative CTA for large vessel occlusion. 2. Severe atheromatous disease about the aortic arch and origin  of the great vessels. Resultant stenoses of up to 65% at the right brachiocephalic artery, 87% at the proximal left common carotid artery, 60-70% at the origin of the right common carotid artery and 75% at the proximal right subclavian artery. Vascular stent in place at the origin of the left subclavian artery with widely patent flow. 3. Sequelae of prior bilateral CEAs without significant residual stenosis. Atheromatous stenosis of up to 70% at the distal right ICA. No other significant stenosis about the left ICA. 4. Relative sparing of the posterior circulation with wide patency of the vertebrobasilar system. Attenuated flow within the right vertebral artery likely related to the above-mentioned proximal right subclavian artery stenosis. 5. 3.2 cm left thyroid if not already performed. Dedicated nonemergent thyroid ultrasound recommended 6. Emphysema with postoperative changes within the partially visualized left lung. Electronically Signed   By: Jeannine Boga M.D.   On: 06/17/2018 04:34   Mr Brain Wo Contrast  Result Date: 06/17/2018 CLINICAL DATA:  Initial evaluation for gradually progressive weakness for several days, ataxia. EXAM: MRI HEAD WITHOUT CONTRAST TECHNIQUE: Multiplanar, multiecho pulse sequences of the brain and surrounding structures were obtained without intravenous contrast. COMPARISON:  Prior CT from 06/16/2018 as well as previous MRI from 06/13/2016 FINDINGS: Brain: Examination moderately degraded by motion artifact. Mildly advanced age-related cerebral atrophy. Extensive confluent T2/FLAIR hyperintensity throughout the periventricular and deep white matter both cerebral hemispheres, with involvement of the pons, most likely related chronic microvascular ischemic disease. Few small superimposed remote lacunar infarcts within the right thalamus. Punctate 5 mm infarct involving the cortical gray matter of the right precentral gyrus (series 5, image 64). No associated hemorrhage or mass  effect. No other evidence for acute or subacute ischemia. Gray-white matter differentiation otherwise maintained. No other areas of remote cortical infarction. No foci of susceptibility artifact to suggest acute or chronic intracranial hemorrhage. No mass lesion, midline  shift or mass effect. No hydrocephalus. No extra-axial fluid collection. Pituitary gland suprasellar region normal. Midline structures intact and normal. Vascular: Major intracranial vascular flow voids maintained at the skull base. Skull and upper cervical spine: Craniocervical junction normal. Upper cervical spine within normal limits. Bone marrow signal intensity normal. No scalp soft tissue abnormality. Sinuses/Orbits: Globes orbital soft tissues within normal limits. Paranasal sinuses are clear. Left-sided mastoid effusion with partial opacification of the left middle ear cavity, similar to previous, and of doubtful significance in the acute setting. Trace fluid noted within the right mastoid air cells as well. Other: None. IMPRESSION: 1. 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 2. Underlying advanced cerebral white matter changes, most likely related to chronic microvascular ischemic disease, similar to previous. Electronically Signed   By: Jeannine Boga M.D.   On: 06/17/2018 01:38   2D Echocardiogram   1. The left ventricle has normal systolic function with an ejection fraction of 60-65%. The cavity size was normal. Moderate septal hypertrophy. Left ventricular diastolic Doppler parameters are consistent with impaired relaxation.  2. The right ventricle has mildly reduced systolic function. The cavity was mildly enlarged. There is no increase in right ventricular wall thickness. Right ventricular systolic pressure is severely elevated with an estimated pressure of 69.9 mmHg.  3. Left atrial size was moderately dilated.  4. The mitral valve is normal in structure.  5. The  tricuspid valve is normal in structure.  6. The aortic valve is normal in structure.  7. The pulmonic valve was normal in structure.  8. There is mild dilatation of the ascending aorta. I think what ever it is of Christanna me is the same thing  PHYSICAL EXAM Pleasant elderly Caucasian lady not in distress. . Afebrile. Head is nontraumatic. Neck is supple without bruit.    Cardiac exam no murmur or gallop. Lungs are clear to auscultation. Distal pulses are well felt. Neurological Exam ;  Awake  Alert oriented x 3. Normal speech and language.eye movements full without nystagmus.fundi were not visualized. Vision acuity and fields appear normal. Hearing is normal. Palatal movements are normal. Face symmetric. Tongue midline. Normal strength, tone, reflexes and coordination. Normal sensation. Gait deferred.   ASSESSMENT/PLAN Ms. MATTILYNN FORRER is a 78 y.o. female with history of previous stroke, diabetes, hypertension, hypercholesterolemia, carotid artery disease presenting with slurred speech for several days and increased difficulty walking who developed a syncopal event with no postictal state..   Stroke: Punctate right precentral gyrus cortical infarct, incidental finding, felt to be secondary to small vessel disease  CT head Small vessel disease. Atrophy.   MRI punctate nonhemorrhagic cortical infarct involving the right precentral gyrus.  Small vessel disease.  CTA head & neck no LVO.  Severe atherosclerosis (R brachiocephalic 28%, L CCA 76%, R CCA 60 to 70%, proximal R ICA 75%) vascular stent at origin subclavian artery.  Significant residual stenosis as sequela of prior bilateral right 3.2 cm left thyroid nodule.  Emphysema.  Carotid Doppler  B ICA 1-39% stenosis, VAs antegrade   2D Echo  EF 60-65 year stuck in here with %. No source of embolus   LDL 44  HgbA1c 7.9  Lovenox 40 mg sq daily for VTE prophylaxis  aspirin 81 mg daily prior to admission, now on aspirin 325 mg daily  and clopidogrel 75 mg daily following load. Given large vessel intracranial atherosclerosis, patient should be treated with aspirin 81 mg and clopidogrel 75 mg orally every day x  3 months for secondary stroke prevention. After 3 months, change to plavix alone. Long-term dual antiplatelets are contraindicated due to risk for intracerebral hemorrhage.   Therapy recommendations:   SNF has had  Disposition:  pending   Continue DAPT. Stroke team will sign off. No further neurological follow up needed.   Hypertension  Stable . BP goal normotensive  Hyperlipidemia  Home meds: Pravachol 40 and fish oil, on Pravachol 40 and Lovaza in hospital  LDL 44, goal < 70  Continue statin and fish oil at discharge  Diabetes type II  HgbA1c 7.9, goal < 7.0  Uncontrolled  Other Stroke Risk Factors  Advanced age  Former cigarette smoker, quit 4 years ago   Former smokeless tobacco user, quit 5 years ago  Obesity, Body mass index is 33.04 kg/m., recommend weight loss, diet and exercise as appropriate   Family hx stroke (mother)  Diastolic CHF  Carotid artery disease  Peripheral artery disease  Other Active Problems  Severe pulmonary HTN  Thyroid nodule  Hx lung cancer s/p lobectomy  Syncope due to dehydration and deconditioning  ARF d/t dehydration  Hospital day # 0  Burnetta Sabin, MSN, APRN, ANVP-BC, AGPCNP-BC Advanced Practice Stroke Nurse [place] for Schedule & Pager information 06/17/2018 5:14 PM  I have personally obtained history,examined this patient, reviewed notes, independently viewed imaging studies, participated in medical decision making and plan of care.ROS completed by me personally and pertinent positives fully documented  I have made any additions or clarifications directly to the above note. Agree with note above. The patient presented with a syncopal event and MRI shows tiny punctate right precentral gyrus infarct likely secondary to  hypotension and CT angiograms suggest moderate right common carotid stenosis which needs further evaluation with carotid ultrasound and if stenosis is confirmed she may need to repeat carotid endarterectomy or stenting in vascular surgery consult. Continue antiplatelet therapy for now and ongoing stroke workup and aggressive risk factor modification. Greater than 50% time during this 35 minute visit was spent on counseling and coordination of care of her tiny stroke in discussion about carotid restenosis and answering questions. Discussed with Dr. Candiss Norse. Antony Contras, MD Medical Director [facility] Pager: [phone] 06/17/2018 5:29 PM  To contact Stroke Continuity provider, please refer to [email or web address] After hours, contact General Neurology

## 2018-06-17 NOTE — Progress Notes (Signed)
OT Evaluation  PTA, pt lived alone at home and was modified independent with ADL and mobility. Pt states that "things have been harder to do " over the last month. States she has fallen 5-6 times recently and is unable to get up by herself and crawls to the phone to get help. States she falls because she "gives out". During assessment, pt was unable to stand at sink and complete grooming task after going to the bathroom due to 4/4 dyspnea. SpO2 @ 85 RA. SpO2 returns to 92 on RA at rest. Unsure of baseline cognition - pt walked away from sink with water running. Poor insight into safety/deficits. Will further assess. Feel pt will benefit from rehab at SNF to maximize functional level of independence to facilitate return home. Will follow acutely. Discussed recommendations for postacute rehab with pt. Pt states she will "think about it".     06/17/18 1300  OT Visit Information  Last OT Received On 06/17/18  Assistance Needed +1  History of Present Illness Caitlin Oliver is a 78 y.o. female with history of hypertension, hyperlipidemia, peripheral vascular disease status post bilateral carotid endarterectomy and lower extremity stent placement, depression, diabetes mellitus type 2 was brought to the ER after patient had a brief episode of syncope while walking to go to the PCPs office.  Over the last 3 days patient has been finding increasingly difficult to walk with ataxia and staggering towards the left side.  Also has been having some slurred speech.  MRI showed Punctate 5 mm acute ischemic nonhemorrhagic cortical infarct involving the right precentral gyrus.  Precautions  Precautions Fall  Home Living  Family/patient expects to be discharged to: Private residence  Living Arrangements Alone  Available Help at Discharge Family;Available PRN/intermittently (brother and nephew)  Type of [place] to enter  Entrance Stairs-Number of Steps 6  Entrance Stairs-Rails Right  Home  Layout One level  Bathroom Shower/Tub Walk-in shower  Bathroom Toilet Handicapped height  Bathroom Accessibility Yes  How Accessible Accessible via walker  [place] - 2 wheels;Shower seat;Grab bars - tub/shower;Hand held shower head (broom stick)   Lives With Alone  Prior Function  Level of Independence Independent with assistive device(s)  Comments Mod I with stick or RW in/out of home.  Used scooters for grocery and big box shopping.; LB ADL are difficult with increased SOB; 5-6 falls in the last month  Communication  Communication  (slurred speech fully intelligible)  Pain Assessment  Pain Assessment Faces  Faces Pain Scale 4  Pain Location left rib   Pain Descriptors / Indicators Cramping;Grimacing;Guarding  Pain Intervention(s) Limited activity within patient's tolerance;Other (comment) (nsg made aware)  Cognition  Arousal/Alertness Awake/alert  Behavior During Therapy WFL for tasks assessed/performed  Overall Cognitive Status Difficult to assess  General Comments pt is initially distractible, but then focused adequately.  Follows direction for eval.  Upper Extremity Assessment  Upper Extremity Assessment Generalized weakness  Lower Extremity Assessment  Lower Extremity Assessment Defer to PT evaluation  RLE Deficits / Details general weakness at 4/5, intact Light touch.  LLE Deficits / Details noticably weaker than right.  hip flexors, hams, pf weak at 4-, quads 4/5  LLE Coordination decreased fine motor  Cervical / Trunk Assessment  Cervical / Trunk Assessment Normal  ADL  Overall ADL's  Needs assistance/impaired  Eating/Feeding Independent  Grooming Set up;Sitting;Supervision/safety  Upper Body Bathing Set up;Supervision/ safety;Sitting  Lower Body Bathing Minimal assistance;Sit to/from stand  Upper Body  Dressing  Set up;Sitting  Lower Body Dressing Minimal assistance;Sit to/from Retail buyer Minimal assistance;RW;Ambulation;Comfort height  toilet;Grab bars  Toileting- Forensic psychologist Min guard;Sit to/from stand  Functional mobility during ADLs Minimal assistance;Rolling walker;Cueing for safety  General ADL Comments Difficulty with LB dressing due to SOB. O2 desat to 85 on RA with activity. unsteady and posterior LOB x 1. 4/4 dyspnea after walking to the bathroom. Unable to stand at sink to comlete grooming and required to sit to recover adn prevent fall.   Vision- History  Baseline Vision/History Wears glasses  Wears Glasses At all times  Patient Visual Report No change from baseline  Vision- Assessment  Additional Comments no changes  Perception  Comments appeasr intact  Praxis  Praxis tested? WFL  Bed Mobility  Overal bed mobility Needs Assistance  Bed Mobility Supine to Sit  Supine to sit Supervision;HOB elevated  Transfers  Overall transfer level Needs assistance  Equipment used Rolling walker (2 wheeled);None  Transfers Sit to/from Stand  Sit to [employer] guard  General transfer comment no carry over from earlier PT session regarding education on hand placement. Pt pulling oup on RW and falling backwards while holding onto walker. second attempt, pt ableto come to standing without difficulty  Balance  Overall balance assessment Needs assistance;History of Falls  Sitting-balance support No upper extremity supported  Sitting balance-Leahy Scale Fair  Standing balance support No upper extremity supported;Single extremity supported  Standing balance-Leahy Scale Poor  Standing balance comment reliant on external support; multiple falls  General Comments  General comments (skin integrity, edema, etc.) Pt reports more difficulty with self care and ambulation over the "last month or so"  OT - End of Session  Equipment Utilized During Treatment Gait belt;Rolling walker;Oxygen  Activity Tolerance Patient limited by fatigue  Patient left in chair;with call bell/phone within reach;with chair alarm set   Nurse Communication Mobility status;Other (comment) (complaints of chest pain)  OT Assessment  OT Recommendation/Assessment Patient needs continued OT Services  OT Visit Diagnosis Unsteadiness on feet (R26.81);Repeated falls (R29.6);Muscle weakness (generalized) (M62.81);Cognitive communication deficit (R41.841);Pain  Symptoms and signs involving cognitive functions Cerebral infarction  Pain - Right/Left Left  Pain - part of body  (chest)  OT Problem List Decreased strength;Decreased activity tolerance;Impaired balance (sitting and/or standing);Decreased cognition;Decreased safety awareness;Cardiopulmonary status limiting activity;Obesity;Pain  OT Plan  OT Frequency (ACUTE ONLY) Min 2X/week  OT Treatment/Interventions (ACUTE ONLY) Self-care/ADL training;Therapeutic exercise;Energy conservation;DME and/or AE instruction;Therapeutic activities;Cognitive remediation/compensation;Patient/family education;Balance training  AM-PAC OT "6 Clicks" Daily Activity Outcome Measure (Version 2)  Help from another person eating meals? 4  Help from another person taking care of personal grooming? 3  Help from another person toileting, which includes using toliet, bedpan, or urinal? 3  Help from another person bathing (including washing, rinsing, drying)? 3  Help from another person to put on and taking off regular upper body clothing? 3  Help from another person to put on and taking off regular lower body clothing? 3  6 Click Score 19  OT Recommendation  Follow Up Recommendations SNF;Supervision/Assistance - 24 hour  OT Equipment 3 in 1 bedside commode  Individuals Consulted  Consulted and Agree with Results and Recommendations Patient  Acute Rehab OT Goals  Patient Stated Goal to get stronger  OT Goal Formulation With patient  Time For Goal Achievement 07/01/18  Potential to Achieve Goals Good  OT Time Calculation  OT Start Time (ACUTE ONLY) 1150  OT Stop Time (ACUTE ONLY) 1219  OT Time  Calculation (min) 29 min  OT General Charges  $OT Visit 1 Visit  OT Evaluation  $OT Eval Moderate Complexity 1 Mod  OT Treatments  $Self Care/Home Management  8-22 mins  Written Expression  Dominant Hand Right  Maurie Boettcher, OT/L   Acute OT Clinical Specialist Acute Rehabilitation Services Pager [phone] Office [phone]

## 2018-06-17 NOTE — Plan of Care (Signed)
  Problem: Education: Goal: Knowledge of disease or condition will improve Outcome: Progressing   Problem: Education: Goal: Knowledge of secondary prevention will improve Outcome: Progressing   Problem: Education: Goal: Knowledge of patient specific risk factors addressed and post discharge goals established will improve Outcome: Progressing   Problem: Coping: Goal: Will verbalize positive feelings about self Outcome: Progressing   Problem: Education: Goal: Individualized Educational Video(s) Outcome: Progressing   Problem: Coping: Goal: Will identify appropriate support needs Outcome: Progressing   Problem: Health Behavior/Discharge Planning: Goal: Ability to manage health-related needs will improve Outcome: Progressing   Problem: Health Behavior/Discharge Planning: Goal: Ability to manage health-related needs will improve Outcome: Progressing   Problem: Self-Care: Goal: Ability to participate in self-care as condition permits will improve Outcome: Progressing   Problem: Self-Care: Goal: Verbalization of feelings and concerns over difficulty with self-care will improve Outcome: Progressing

## 2018-06-17 NOTE — Progress Notes (Signed)
Inpatient Diabetes Program Recommendations  AACE/ADA: New Consensus Statement on Inpatient Glycemic Control (2015)  Target Ranges:  Prepandial:   less than 140 mg/dL      Peak postprandial:   less than 180 mg/dL (1-2 hours)      Critically ill patients:  140 - 180 mg/dL   Lab Results  Component Value Date   GLUCAP 231 (H) 06/17/2018   HGBA1C 7.9 (H) 06/17/2018    Review of Glycemic Control  Diabetes history: DM2 Outpatient Diabetes medications: Glucophage 520m BID Current orders for Inpatient glycemic control: Novolog sensitive scale (0-9 units) tid wc   Noted diabetes coordinator consult for diabetic and insulin teaching. Confirmed with Dr. SCandiss Norsethat patient will be discharged on insulin. Ordered "Living well with diabetes" book for patient (bedside RN gave to her) and insulin pen starter kit. Went to meet with patient to discuss diabetes care and insulin administration and patient currently not in room (in u/s per RN).  Note patient may be going to SNF facility at discharge home.   Will attempt again to see patient tomorrow.   -- Will follow during hospitalization.--  SJonna ClarkRN, MSN Diabetes Coordinator Inpatient Glycemic Control Team Team Pager: [phone](8am-5pm)

## 2018-06-17 NOTE — Progress Notes (Signed)
Carotid duplex       has been completed. Preliminary results can be found under CV proc through chart review. June Leap, BS, RDMS, RVT

## 2018-06-17 NOTE — Evaluation (Signed)
Speech Language Pathology Evaluation Patient Details Name: Caitlin Oliver MRN: [id number] DOB: 20-Aug-1940 Today's Date: 06/17/2018 Time:  -     Problem List:  Patient Active Problem List   Diagnosis Date Noted  . Acute CVA (cerebrovascular accident) ([place]) 06/17/2018  . Syncope 06/16/2018  . Chest pain 06/16/2018  . Pain 09/23/2017  . Cervicalgia 07/16/2017  . Pain in both upper extremities 06/03/2017  . Pain in both lower extremities 06/03/2017  . Bilateral carotid artery stenosis 06/03/2017  . Benign paroxysmal positional vertigo 10/23/2016  . Headache 06/19/2016  . Cancer of left lung ([place]) 02/03/2016  . COPD GOLD II 12/08/2015  . Solitary pulmonary nodule 12/08/2015  . Bilateral occipital neuralgia 11/22/2015  . Double vision 10/28/2015  . TIA (transient ischemic attack) 10/28/2015  . Subclavian artery stenosis, left ([place]) 10/28/2015  . Subclavian steal syndrome 10/28/2015  . Acute blood loss anemia 10/20/2013  . C. difficile colitis 10/19/2013  . GI bleed 10/18/2013  . COPD 10/18/2013  . Bleeding gastrointestinal 10/18/2013  . Hypoxia 10/10/2013  . Bronchitis 10/08/2013  . PVD (peripheral vascular disease) with claudication ([place]) 07/29/2013  . Carotid stenosis 05/07/2013  . Stenosis of left carotid artery 05/07/2013  . Occlusion and stenosis of carotid artery without mention of cerebral infarction 03/02/2013  . Carotid artery obstruction 03/02/2013  . Carotid artery disease ([place]) 02/27/2013  . Tobacco abuse 02/27/2013  . Compulsive tobacco user syndrome 02/27/2013  . Claudication ([place]) 01/20/2013  . Essential hypertension 01/20/2013  . Type 2 diabetes mellitus ([employer]) 01/20/2013  . Hyperlipidemia 01/20/2013   Past Medical History:  Past Medical History:  Diagnosis Date  . Anxiety   . Carotid artery disease ([place])   . Claudication ([place])   . COPD (chronic obstructive pulmonary disease) ([place])   . Diabetes mellitus   . Dizzy   . GERD (gastroesophageal reflux  disease)   . Headache   . History of kidney stones   . Hypercholesteremia   . Hypertension   . 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 Shortness of breath    with exertion  . Stomach ulcer   . Tobacco abuse    Past Surgical History:  Past Surgical History:  Procedure Laterality Date  . ABDOMINAL HYSTERECTOMY    . APPENDECTOMY    . BREAST REDUCTION SURGERY    . CAROTID ANGIOGRAM N/A 02/25/2013   Procedure: CAROTID ANGIOGRAM;  Surgeon: Lorretta Harp, MD;  [facility];  Service: Cardiovascular;  Laterality: N/A;  . ENDARTERECTOMY Right 03/06/2013   Procedure: ENDARTERECTOMY CAROTID-RIGHT;  Surgeon: Serafina Mitchell, MD;  Location: [place];  Service: Vascular;  Laterality: Right;  . ENDARTERECTOMY Left 05/07/2013   Procedure: LEFT CAROTID ARTERY ENDARTERECTOMY WITH VASCU-GUARD PATCH ANGIOPLASTY ;  Surgeon: Serafina Mitchell, MD;  Location: [place];  Service: Vascular;  Laterality: Left;  . LOBECTOMY Left 02/03/2016   Procedure: LEFT UPPER LOBECTOMY;  Surgeon: [name], MD;  Location: [place];  Service: Thoracic;  Laterality: Left;  . LOWER EXTREMITY ANGIOGRAM N/A 02/25/2013   Procedure: LOWER EXTREMITY ANGIOGRAM;  Surgeon: Lorretta Harp, MD;  Location[facility] CATH LAB;  Service: Cardiovascular;  Laterality: N/A;  . LOWER EXTREMITY ANGIOGRAM N/A 07/27/2013   Procedure: LOWER EXTREMITY ANGIOGRAM;  Surgeon: Lorretta Harp, MD;  Location: [facility] CATH LAB;  Service: Cardiovascular;  Laterality: N/A;  . PATCH ANGIOPLASTY Right 03/06/2013   Procedure: PATCH ANGIOPLASTY of Right Carotid Artery using Vascu-Guard Patch;  Surgeon: Durene Fruits  Pierre Bali, MD;  Location: [place];  Service: Vascular;  Laterality: Right;  . PERIPHERAL VASCULAR CATHETERIZATION N/A 11/15/2015   Procedure: Aortic Arch Angiography;  Surgeon: Serafina Mitchell, MD;  Location: [place] CV LAB;  Service: Cardiovascular;  Laterality: N/A;  . PERIPHERAL VASCULAR CATHETERIZATION  Bilateral 11/15/2015   Procedure: Carotid Angiography;  Surgeon: Serafina Mitchell, MD;  Location: [place] CV LAB;  Service: Cardiovascular;  Laterality: Bilateral;  . PERIPHERAL VASCULAR CATHETERIZATION Left 11/15/2015   Procedure: Upper Extremity Angiography;  Surgeon: Serafina Mitchell, MD;  Location: [place] CV LAB;  Service: Cardiovascular;  Laterality: Left;  . PERIPHERAL VASCULAR CATHETERIZATION Left 11/15/2015   Procedure: Peripheral Vascular Intervention;  Surgeon: Serafina Mitchell, MD;  Location: [place] CV LAB;  Service: Cardiovascular;  Laterality: Left;  subclavian   . SALIVARY GLAND SURGERY     scar tissue removed from left saliva glad  . TUBAL LIGATION    . VIDEO ASSISTED THORACOSCOPY (VATS)/WEDGE RESECTION Left 02/03/2016   Procedure: VIDEO ASSISTED THORACOSCOPY;  Surgeon: [name], MD;  Location: [place];  Service: Thoracic;  Laterality: Left;   HPI:  78 y.o. female with a history of previous stroke, diabetes, hypertension, hypercholesterolemia, carotid artery disease who presents with dysarthric speech over the course of several days. MRI revealed new punctate infarct right precentral gyrus.   Assessment / Plan / Recommendation Clinical Impression  Pt presents with a mild dysarthria of speech, mild-mod cognitive deficits related to short-term verbal memory and problem-solving.  Pt endorses gradual changes in memory over the last several months, describing herself as becoming "more forgetful" and that she "gets mixed up more." She lives alone and states she has been independent with shopping/errands/finances.  Pt is oriented x4; expressive/receptive language are WNL; semantic memory intact; delayed verbal recall of word lists and recall of paragraph information is mildly impaired.  Pt had difficulty with descriptive problem-solving tasks involving meds management and hypothetical situational problems.  Reading WNL; pt able to search menu, problem-solve with use of  phone/call bell to meet the most basic needs.  Pt describes good support from nephew and brother/sister-in-law, who live close by, level of support they can offer is unknown. SLP will follow acutely to address deficits described above. At this time, pt would benefit from 24 hour supervision given inconsistent insight and concerns for her safety.     SLP Assessment  SLP Visit Diagnosis: Cognitive communication deficit (R41.841)    Follow Up Recommendations  Skilled Nursing facility    Frequency and Duration min 2x/week  2 weeks      SLP Evaluation Cognition  Overall Cognitive Status: Difficult to assess Arousal/Alertness: Awake/alert Orientation Level: Oriented X4 Attention: Selective Selective Attention: Appears intact Memory: Impaired Memory Impairment: Retrieval deficit;Decreased short term memory Decreased Short Term Memory: Verbal basic Awareness: Appears intact Problem Solving: Impaired Problem Solving Impairment: Verbal complex Executive Function: Decision Making Safety/Judgment: Impaired(to be assessed further)       Comprehension  Auditory Comprehension Overall Auditory Comprehension: Appears within functional limits for tasks assessed Yes/No Questions: Within Functional Limits Commands: Within Functional Limits Visual Recognition/Discrimination Discrimination: Within Function Limits Reading Comprehension Reading Status: Within funtional limits    Expression Expression Primary Mode of Expression: Verbal Verbal Expression Overall Verbal Expression: Appears within functional limits for tasks assessed Written Expression Dominant Hand: Right Written Expression: Not tested   Oral / Motor  Oral Motor/Sensory Function Overall Oral Motor/Sensory Function: Mild impairment(left facial asymmetry baseline per pt) Facial Symmetry: Abnormal symmetry left  Lingual ROM: Within Functional Limits Lingual Symmetry: Within Functional Limits [place]: Within Functional  Limits Mandible: Within Functional Limits Motor Speech Overall Motor Speech: Impaired Phonation: Hoarse Resonance: Within functional limits Articulation: Impaired Level of Impairment: Conversation Intelligibility: Intelligible Motor Planning: Witnin functional limits Motor Speech Errors: Not applicable   GO                    Juan Quam Laurice 06/17/2018, 1:37 PM  Quadry Kampa L. Tivis Ringer, [place] Office number [phone] Pager [phone]

## 2018-06-17 NOTE — Clinical Social Work Note (Signed)
Clinical Social Work Assessment  Patient Details  Name: Caitlin Oliver MRN: [id number] Date of Birth: 1941/02/23  Date of referral:  06/17/18               Reason for consult:  Facility Placement                Permission sought to share information with:  Facility Sport and exercise psychologist, Family Supports Permission granted to share information::  Yes, Verbal Permission Granted  Name::     Actuary::  SNFs  Relationship::  Brother  Contact Information:  [phone]  Housing/Transportation Living arrangements for the past 2 months:  Single Family Home Source of Information:  Patient Patient Interpreter Needed:  None Criminal Activity/Legal Involvement Pertinent to Current Situation/Hospitalization:  No - Comment as needed Significant Relationships:  Siblings Lives with:  Self Do you feel safe going back to the place where you live?  Yes Need for family participation in patient care:  No (Coment)  Care giving concerns:  CSW received consult for possible SNF placement at time of discharge. CSW spoke with patient regarding PT recommendation of SNF placement at time of discharge. Patient reported that she lives alone but had a previous stroke and she was able to do outpatient therapy. Patient expressed understanding of PT recommendation and would prefer to return home at time of discharge, however she is in agreement for CSW to send out referral. CSW to continue to follow and assist with discharge planning needs.   Social Worker assessment / plan:  CSW spoke with patient concerning possibility of rehab at [facility] before returning home.  Employment status:  Retired Nurse, adult PT Recommendations:  [place] / Referral to community resources:  [place]  Patient/Family's Response to care:  Patient recognizes need for rehab before returning home and may be agreeable to a SNF in [place]. Patient reported  preference for [facility] since her brother is there. CSW will follow up on bed availability and insurance authorization.  Patient/Family's Understanding of and Emotional Response to Diagnosis, Current Treatment, and Prognosis:  Patient/family is realistic regarding therapy needs and expressed being hopeful for SNF placement. Patient expressed understanding of CSW role and discharge process as well as medical condition. No questions/concerns about plan or treatment.    Emotional Assessment Appearance:  Appears stated age Attitude/Demeanor/Rapport:  Engaged Affect (typically observed):  Accepting, Appropriate Orientation:  Oriented to Self, Oriented to Place, Oriented to  Time, Oriented to Situation Alcohol / Substance use:  Not Applicable Psych involvement (Current and /or in the community):  No (Comment)  Discharge Needs  Concerns to be addressed:  Care Coordination Readmission within the last 30 days:  No Current discharge risk:  Lives alone Barriers to Discharge:  Continued Medical Work up   [employer], LCSW 06/17/2018, 1:56 PM

## 2018-06-17 NOTE — Consult Note (Addendum)
Neurology Consultation Reason for Consult: Facial droop Referring Physician: Zenia Resides, a  CC: Facial droop  History is obtained from: Patient  HPI: Caitlin Oliver is a 78 y.o. female with a history of previous stroke, diabetes, hypertension, hypercholesterolemia, carotid artery disease who presents with slurred speech and is been present for a couple of days.  She is also noticed some increased difficulty walking over the past few days and increased slurred speech.  Tonight, she had a syncopal event, unfortunately  the son who is with her is not available at this time, but per the chart she leaned against the side of the staircase and was "unconscious", but then she was awake immediately with no postictal state.  LKW: Unclear, but at least a few days tpa given?: no, outside of window   ROS: A 14 point ROS was performed and is negative except as noted in the HPI.   Past Medical History:  Diagnosis Date  . Anxiety   . Carotid artery disease ([place])   . Claudication ([place])   . COPD (chronic obstructive pulmonary disease) ([place])   . Diabetes mellitus   . Dizzy   . GERD (gastroesophageal reflux disease)   . Headache   . History of kidney stones   . Hypercholesteremia   . Hypertension   . 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 Shortness of breath    with exertion  . Stomach ulcer   . Tobacco abuse      Family History  Problem Relation Age of Onset  . Stroke Mother   . Hypertension Mother   . Kidney failure Father      Social History:  reports that she quit smoking about 4 years ago. Her smoking use included cigarettes. She has a 84.00 pack-year smoking history. She quit smokeless tobacco use about 5 years ago. She reports that she does not drink alcohol or use drugs.   Exam: Current vital signs: BP (!) 165/109   Pulse 87   Temp 97.9 F (36.6 C) (Oral)   Resp 19   SpO2 99%  Vital signs in last 24 hours: Temp:  [97.9 F  (36.6 C)] 97.9 F (36.6 C) (03/02 1732) Pulse Rate:  [87-100] 87 (03/03 0000) Resp:  [18-19] 19 (03/03 0000) BP: (123-165)/(94-113) 165/109 (03/03 0000) SpO2:  [83 %-100 %] 99 % (03/03 0000)   Physical Exam  Constitutional: Appears well-developed and well-nourished.  Psych: Affect appropriate to situation Eyes: No scleral injection HENT: No OP obstrucion Head: Normocephalic.  Cardiovascular: Normal rate and regular rhythm.  Respiratory: Effort normal, non-labored breathing GI: Soft.  No distension. There is no tenderness.  Skin: WDI  Neuro: Mental Status: Patient is awake, alert, oriented to person, place, month, year, and situation. Patient is able to give a clear and coherent history. No signs of aphasia or neglect Cranial Nerves: II: Visual Fields are full. Pupils are equal, round, and reactive to light.   III,IV, VI: EOMI without ptosis or diploplia.  V: Facial sensation is symmetric to temperature VII: Facial movement with left facial weakness VIII: hearing is intact to voice X: Uvula elevates symmetrically XI: Shoulder shrug is symmetric. XII: tongue is midline without atrophy or fasciculations.  Motor: Tone is normal. Bulk is normal. 5/5 strength was present in all four extremities.  Sensory: Sensation is symmetric to light touch and temperature in the arms and legs. Cerebellar: FNF slightly slower and less accurate on the left without past-pointing  I have  reviewed labs in epic and the results pertinent to this consultation are: CBC-Mildly low platelets Mild hyponatremia at 132, mildly elevated creatinine at 1.32  I have reviewed the images obtained: MRI of the brain-punctate infarct in the right precentral gyrus  Impression: 78 year old female with new punctate infarct in the right precentral gyrus.  Possibilities include cardio embolus, artery to artery embolus, small vessel disease.  She will need to be admitted for further work-up and therapy.  I am  uncertain if the event tonight was actually syncope, but the stomach towards the left I think it is likely explained by her new stroke even though it is small.  Recommendations: - HgbA1c, fasting lipid panel - MRI of the brain without contrast - Frequent neuro checks - Echocardiogram -CTA head and neck - Prophylactic therapy-Antiplatelet med: Aspirin - 57m daily and Plavix 75 mg daily following 100 mg load - Risk factor modification - Telemetry monitoring - PT consult, OT consult, Speech consult - Stroke team to follow   MRoland Rack MD [facility] [phone] If 7pm- 7am, please page neurology on call as listed in [place]

## 2018-06-17 NOTE — Care Management Note (Signed)
Case Management Note  Patient Details  Name: ZAFIRO ROUTSON MRN: [id number] Date of Birth: [birth year]  Subjective/Objective:     Admitted with s/s of stroke, hx of hypertension, hyperlipidemia, peripheral vascular disease status post bilateral carotid endarterectomy and lower extremity stent placement, depression, diabetes mellitus type. From home alone. PTA independent with ADL's. DME : cane , walker.       Scherrie Bateman ([address]) Maridel Pixler [phone]     PCP: Legrand Rams  Action/Plan:  SNF vs home with home health services Per physical therapist eval./recommendations:SNF;Supervision/Assistance - 24 hour.   NCM spoke with pt @ bedside regarding SNF placement . Pt would like to d/c to home, but is  ? open to SNF placement.  CSW made aware of potential need for SNF placement.   Expected Discharge Date:                  Expected Discharge Plan:  Skilled Nursing Facility  In-House Referral:  Clinical Social Work  Discharge planning Services  CM Consult  Post Acute Care Choice:    Choice offered to:     DME Arranged:    DME Agency:     HH Arranged:    [place] Agency:     Status of Service:  In process, will continue to follow  If discussed at Long Length of Stay Meetings, dates discussed:    Additional Comments:  Sharin Mons, RN 06/17/2018, 1:22 PM

## 2018-06-17 NOTE — Progress Notes (Signed)
Patient arrived to [facility] around 04:20. Patient alert and oriented x4. Denied pain, somewhat lethargic but arousal to voice and follow direction. Left facial droop, notified admission physician on MRI and CT result, new order received and initiated. Received report from ED nurse Almyra Free that patient passed swallow test. NIH performed on arrival. Patient denied pain or discomfort on arrival.

## 2018-06-17 NOTE — ED Notes (Signed)
ED TO INPATIENT HANDOFF REPORT  ED Nurse Name and Phone #: Claiborne Billings [phone]  S Name/Age/Gender Caitlin Oliver 78 y.o. female Room/Bed: H013C/H013C  Code Status   Code Status: Full Code  Home/SNF/Other Home Patient oriented to: self, time,place, year Is this baseline? Yes   Triage Complete: Triage complete  Chief Complaint ALOC; Disorientation; Falls  Triage Note Pt here for chest pain and confusion.  No family in triage but patient states her PCP called to have her come to the ED because her chest hurts and she is weak.  Weakness all over. VAN negative.  Pt states the weakness has been for weeks.    Allergies Allergies  Allergen Reactions  . No Known Allergies Other (See Comments)    Level of Care/Admitting Diagnosis ED Disposition    ED Disposition Condition [facility]: [place] [100100]  Level of Care: Medical Telemetry [104]  I expect the patient will be discharged within 24 hours: Yes  LOW acuity---Tx typically complete <24 hrs---ACUTE conditions typically can be evaluated <24 hours---LABS likely to return to acceptable levels <24 hours---IS near functional baseline---EXPECTED to return to current living arrangement---NOT newly hypoxic: Meets criteria for 5C-Observation unit  Diagnosis: Syncope [801655]  Admitting Physician: Rise Patience [phone]  Attending Physician: Rise Patience [[id number]]  PT Class (Do Not Modify): Observation [104]  PT Acc Code (Do Not Modify): Observation [10022]       B Medical/Surgery History Past Medical History:  Diagnosis Date  . Anxiety   . Carotid artery disease ([place])   . Claudication ([place])   . COPD (chronic obstructive pulmonary disease) ([place])   . Diabetes mellitus   . Dizzy   . GERD (gastroesophageal reflux disease)   . Headache   . History of kidney stones   . Hypercholesteremia   . Hypertension   . Peripheral arterial disease (HCC)    bilateral iliac artery stenosis by  angiography  . Shortness of breath    with exertion  . Stomach ulcer   . Tobacco abuse    Past Surgical History:  Procedure Laterality Date  . ABDOMINAL HYSTERECTOMY    . APPENDECTOMY    . BREAST REDUCTION SURGERY    . CAROTID ANGIOGRAM N/A 02/25/2013   Procedure: CAROTID ANGIOGRAM;  Surgeon: Lorretta Harp, MD;  Location: [facility] CATH LAB;  Service: Cardiovascular;  Laterality: N/A;  . ENDARTERECTOMY Right 03/06/2013   Procedure: ENDARTERECTOMY CAROTID-RIGHT;  Surgeon: Serafina Mitchell, MD;  Location: [facility];  Service: Vascular;  Laterality: Right;  . ENDARTERECTOMY Left 05/07/2013   Procedure: LEFT CAROTID ARTERY ENDARTERECTOMY WITH VASCU-GUARD PATCH ANGIOPLASTY ;  Surgeon: Serafina Mitchell, MD;  Location: [facility];  Service: Vascular;  Laterality: Left;  . LOBECTOMY Left 02/03/2016   Procedure: LEFT UPPER LOBECTOMY;  Surgeon: [name], MD;  Location: [facility];  Service: Thoracic;  Laterality: Left;  . LOWER EXTREMITY ANGIOGRAM N/A 02/25/2013   Procedure: LOWER EXTREMITY ANGIOGRAM;  Surgeon: Lorretta Harp, MD;  Location: [facility] CATH LAB;  Service: Cardiovascular;  Laterality: N/A;  . LOWER EXTREMITY ANGIOGRAM N/A 07/27/2013   Procedure: LOWER EXTREMITY ANGIOGRAM;  Surgeon: Lorretta Harp, MD;  Location: [facility] CATH LAB;  Service: Cardiovascular;  Laterality: N/A;  . PATCH ANGIOPLASTY Right 03/06/2013   Procedure: PATCH ANGIOPLASTY of Right Carotid Artery using Vascu-Guard Patch;  Surgeon: Serafina Mitchell, MD;  Location: [facility];  Service: Vascular;  Laterality: Right;  . PERIPHERAL VASCULAR CATHETERIZATION N/A 11/15/2015  Procedure: Aortic Arch Angiography;  Surgeon: Serafina Mitchell, MD;  Location: [facility] CV LAB;  Service: Cardiovascular;  Laterality: N/A;  . PERIPHERAL VASCULAR CATHETERIZATION Bilateral 11/15/2015   Procedure: Carotid Angiography;  Surgeon: Serafina Mitchell, MD;  Location: [facility] CV LAB;  Service: Cardiovascular;  Laterality: Bilateral;  . PERIPHERAL VASCULAR  CATHETERIZATION Left 11/15/2015   Procedure: Upper Extremity Angiography;  Surgeon: Serafina Mitchell, MD;  Location: [facility] CV LAB;  Service: Cardiovascular;  Laterality: Left;  . PERIPHERAL VASCULAR CATHETERIZATION Left 11/15/2015   Procedure: Peripheral Vascular Intervention;  Surgeon: Serafina Mitchell, MD;  Location: [facility] CV LAB;  Service: Cardiovascular;  Laterality: Left;  subclavian   . SALIVARY GLAND SURGERY     scar tissue removed from left saliva glad  . TUBAL LIGATION    . VIDEO ASSISTED THORACOSCOPY (VATS)/WEDGE RESECTION Left 02/03/2016   Procedure: VIDEO ASSISTED THORACOSCOPY;  Surgeon: [name], MD;  Location: [facility];  Service: Thoracic;  Laterality: Left;     A IV Location/Drains/Wounds Patient Lines/Drains/Airways Status   Active Line/Drains/Airways    Name:   Placement date:   Placement time:   Site:   Days:   Peripheral IV 03/10/18 Left;Anterior Forearm   03/10/18    1314    Forearm   99   Peripheral IV 06/16/18 Left Antecubital   06/16/18    1942    Antecubital   1   Incision 05/07/13 Neck Left   05/07/13    1044     1867   Incision (Closed) 12/23/15 Neck Left   12/23/15    1405     907   Incision (Closed) 02/03/16 Back   02/03/16    1004     865          Intake/Output Last 24 hours  Intake/Output Summary (Last 24 hours) at 06/17/2018 0304 Last data filed at 06/16/2018 2148 Gross per 24 hour  Intake 1000 ml  Output -  Net 1000 ml    Labs/Imaging Results for orders placed or performed during the hospital encounter of 06/16/18 (from the past 48 hour(s))  Protime-INR     Status: None   Collection Time: 06/16/18  5:43 PM  Result Value Ref Range   Prothrombin Time 14.3 11.4 - 15.2 seconds   INR 1.1 0.8 - 1.2    Comment: (NOTE) INR goal varies based on device and disease states. Performed at [facility], [place] [address], [place], [place] [address]   APTT     Status: None   Collection Time: 06/16/18  5:43 PM  Result Value Ref Range    aPTT 28 24 - 36 seconds    Comment: Performed at [facility] [address], [place], [place] [address]  CBC     Status: None   Collection Time: 06/16/18  5:43 PM  Result Value Ref Range   WBC 9.9 4.0 - 10.5 K/uL   RBC 4.75 3.87 - 5.11 MIL/uL   Hemoglobin 13.3 12.0 - 15.0 g/dL   HCT 43.4 36.0 - 46.0 %   MCV 91.4 80.0 - 100.0 fL   MCH 28.0 26.0 - 34.0 pg   MCHC 30.6 30.0 - 36.0 g/dL   RDW 13.6 11.5 - 15.5 %   Platelets 300 150 - 400 K/uL   nRBC 0.0 0.0 - 0.2 %    Comment: Performed at [facility], [place] [address] [place], [place] [address]  Differential  Status: None   Collection Time: 06/16/18  5:43 PM  Result Value Ref Range   Neutrophils Relative % 75 %   Neutro Abs 7.6 1.7 - 7.7 K/uL   Lymphocytes Relative 15 %   Lymphs Abs 1.4 0.7 - 4.0 K/uL   Monocytes Relative 7 %   Monocytes Absolute 0.7 0.1 - 1.0 K/uL   Eosinophils Relative 1 %   Eosinophils Absolute 0.1 0.0 - 0.5 K/uL   Basophils Relative 1 %   Basophils Absolute 0.1 0.0 - 0.1 K/uL   Immature Granulocytes 1 %   Abs Immature Granulocytes 0.05 0.00 - 0.07 K/uL    Comment: Performed at [facility] [address] [place], [place] [address]  Comprehensive metabolic panel     Status: Abnormal   Collection Time: 06/16/18  5:43 PM  Result Value Ref Range   Sodium 132 (L) 135 - 145 mmol/L   Potassium 5.1 3.5 - 5.1 mmol/L   Chloride 96 (L) 98 - 111 mmol/L   CO2 23 22 - 32 mmol/L   Glucose, Bld 131 (H) 70 - 99 mg/dL   BUN 30 (H) 8 - 23 mg/dL   Creatinine, Ser 1.32 (H) 0.44 - 1.00 mg/dL   Calcium 9.4 8.9 - 10.3 mg/dL   Total Protein 6.4 (L) 6.5 - 8.1 g/dL   Albumin 3.5 3.5 - 5.0 g/dL   AST 51 (H) 15 - 41 U/L   ALT 64 (H) 0 - 44 U/L   Alkaline Phosphatase 70 38 - 126 U/L   Total Bilirubin 0.7 0.3 - 1.2 mg/dL   GFR calc non Af Amer 39 (L) >60 mL/min   GFR calc Af Amer 45 (L) >60 mL/min   Anion gap 13 5 - 15    Comment: Performed at [facility], [place] [address], [place], [place] [address]   I-stat Creatinine, ED     Status: None   Collection Time: 06/16/18  5:59 PM  Result Value Ref Range   Creatinine, Ser 1.00 0.44 - 1.00 mg/dL  CBG monitoring, ED     Status: Abnormal   Collection Time: 06/16/18  7:37 PM  Result Value Ref Range   Glucose-Capillary 106 (H) 70 - 99 mg/dL  I-Stat Troponin, ED - 0, 3, 6 hours (not at [facility])     Status: None   Collection Time: 06/16/18  7:57 PM  Result Value Ref Range   Troponin i, poc 0.01 0.00 - 0.08 ng/mL   Comment 3            Comment: Due to the release kinetics of cTnI, a negative result within the first hours of the onset of symptoms does not rule out myocardial infarction with certainty. If myocardial infarction is still suspected, repeat the test at appropriate intervals.   I-Stat Troponin, ED - 0, 3, 6 hours (not at [facility])     Status: None   Collection Time: 06/16/18  9:59 PM  Result Value Ref Range   Troponin i, poc 0.01 0.00 - 0.08 ng/mL   Comment 3            Comment: Due to the release kinetics of cTnI, a negative result within the first hours of the onset of symptoms does not rule out myocardial infarction with certainty. If myocardial infarction is still suspected, repeat the test at appropriate intervals.   CBC     Status: Abnormal   Collection Time: 06/16/18 11:14 PM  Result Value Ref Range   WBC  9.4 4.0 - 10.5 K/uL   RBC 4.75 3.87 - 5.11 MIL/uL   Hemoglobin 13.7 12.0 - 15.0 g/dL   HCT 43.9 36.0 - 46.0 %   MCV 92.4 80.0 - 100.0 fL   MCH 28.8 26.0 - 34.0 pg   MCHC 31.2 30.0 - 36.0 g/dL   RDW 13.8 11.5 - 15.5 %   Platelets 124 (L) 150 - 400 K/uL    Comment: REPEATED TO VERIFY PLATELET COUNT CONFIRMED BY SMEAR    nRBC 0.0 0.0 - 0.2 %    Comment: Performed at [facility], [place] [address], [place], [place] [address]  Creatinine, serum     Status: Abnormal   Collection Time: 06/16/18 11:14 PM  Result Value Ref Range   Creatinine, Ser 1.01 (H) 0.44 - 1.00 mg/dL   GFR calc non Af Amer 54 (L) >60 mL/min    GFR calc Af Amer >60 >60 mL/min    Comment: Performed at [facility] [address] [place], [place] [address]  TSH     Status: None   Collection Time: 06/16/18 11:14 PM  Result Value Ref Range   TSH 2.653 0.350 - 4.500 uIU/mL    Comment: Performed by a 3rd Generation assay with a functional sensitivity of <=0.01 uIU/mL. Performed at [facility], [place] [address], [place], [place] [address]   Troponin I - Now Then Q6H     Status: None   Collection Time: 06/16/18 11:14 PM  Result Value Ref Range   Troponin I <0.03 <0.03 ng/mL    Comment: Performed at [facility] [address] [place], [place] [address]  D-dimer, quantitative (not at [facility])     Status: Abnormal   Collection Time: 06/16/18 11:14 PM  Result Value Ref Range   D-Dimer, Quant 0.85 (H) 0.00 - 0.50 ug/mL-FEU    Comment: (NOTE) At the manufacturer cut-off of 0.50 ug/mL FEU, this assay has been documented to exclude PE with a sensitivity and negative predictive value of 97 to 99%.  At this time, this assay has not been approved by the FDA to exclude DVT/VTE. Results should be correlated with clinical presentation. Performed at [facility], [place] [address], [place], [place] [address]    Ct Head Wo Contrast  Result Date: 06/16/2018 CLINICAL DATA:  Focal neurologic deficits with confusion and weakness EXAM: CT HEAD WITHOUT CONTRAST TECHNIQUE: Contiguous axial images were obtained from the base of the skull through the vertex without intravenous contrast. COMPARISON:  10/20/2015 FINDINGS: Brain: Diffuse moderate to marked small vessel ischemic disease of periventricular and subcortical white matter without acute hemorrhage, intra-axial mass nor extra-axial fluid collections. No midline shift or edema. No large vascular territory infarct. Global atrophy is stable. Vascular: Atherosclerosis of the carotid siphons. No hyperdense vessel sign. Skull: Mild osseous demineralization skull base without acute  fracture or bone destruction. Sinuses/Orbits: No acute finding. Other: None IMPRESSION: Atrophy with chronic, largely stable degree of moderate to marked small vessel ischemic disease of periventricular and subcortical white matter. No acute intracranial abnormality. Electronically Signed   By: Ashley Royalty M.D.   On: 06/16/2018 18:14   Mr Brain Wo Contrast  Result Date: 06/17/2018 CLINICAL DATA:  Initial evaluation for gradually progressive weakness for several days, ataxia. EXAM: MRI HEAD WITHOUT CONTRAST TECHNIQUE: Multiplanar, multiecho pulse sequences of the brain and surrounding structures were obtained without intravenous contrast. COMPARISON:  Prior CT from 06/16/2018 as well as previous MRI from 06/13/2016 FINDINGS: Brain:  Examination moderately degraded by motion artifact. Mildly advanced age-related cerebral atrophy. Extensive confluent T2/FLAIR hyperintensity throughout the periventricular and deep white matter both cerebral hemispheres, with involvement of the pons, most likely related chronic microvascular ischemic disease. Few small superimposed remote lacunar infarcts within the right thalamus. Punctate 5 mm infarct involving the cortical gray matter of the right precentral gyrus (series 5, image 64). No associated hemorrhage or mass effect. No other evidence for acute or subacute ischemia. Gray-white matter differentiation otherwise maintained. No other areas of remote cortical infarction. No foci of susceptibility artifact to suggest acute or chronic intracranial hemorrhage. No mass lesion, midline shift or mass effect. No hydrocephalus. No extra-axial fluid collection. Pituitary gland suprasellar region normal. Midline structures intact and normal. Vascular: Major intracranial vascular flow voids maintained at the skull base. Skull and upper cervical spine: Craniocervical junction normal. Upper cervical spine within normal limits. Bone marrow signal intensity normal. No scalp soft tissue  abnormality. Sinuses/Orbits: Globes orbital soft tissues within normal limits. Paranasal sinuses are clear. Left-sided mastoid effusion with partial opacification of the left middle ear cavity, similar to previous, and of doubtful significance in the acute setting. Trace fluid noted within the right mastoid air cells as well. Other: None. IMPRESSION: 1. 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 2. Underlying advanced cerebral white matter changes, most likely related to chronic microvascular ischemic disease, similar to previous. Electronically Signed   By: Jeannine Boga M.D.   On: 06/17/2018 01:38    Pending Labs Unresulted Labs (From admission, onward)    Start     Ordered   06/23/18 0500  Creatinine, serum  (enoxaparin (LOVENOX)    CrCl >/= 30 ml/min)  Weekly,   R    Comments:  while on enoxaparin therapy    06/16/18 2243   06/17/18 0500  Comprehensive metabolic panel  Tomorrow morning,   R     06/16/18 2243   06/17/18 0500  CBC WITH DIFFERENTIAL  Tomorrow morning,   R     06/16/18 2243   06/16/18 2243  Troponin I - Now Then Q6H  Now then every 6 hours,   R     06/16/18 2243          Vitals/Pain Today's Vitals   06/16/18 2000 06/16/18 2015 06/16/18 2207 06/17/18 0000  BP: (!) 158/113 (!) 123/104  (!) 165/109  Pulse: 88   87  Resp: _0 Temp:      TempSrc:      SpO2: (!) 83%   99%  PainSc:   0-No pain     Isolation Precautions No active isolations  Medications Medications  aspirin EC tablet 81 mg (has no administration in time range)  HYDROcodone-acetaminophen (NORCO/VICODIN) 5-325 MG per tablet 1 tablet (has no administration in time range)  diltiazem (CARDIZEM CD) 24 hr capsule 180 mg (has no administration in time range)  metoprolol tartrate (LOPRESSOR) tablet 37.5 mg (has no administration in time range)  pravastatin (PRAVACHOL) tablet 40 mg (has no administration in time range)  DULoxetine  (CYMBALTA) DR capsule 60 mg (60 mg Oral Given 06/17/18 0246)  pantoprazole (PROTONIX) EC tablet 40 mg (40 mg Oral Given 06/17/18 0246)  cyclobenzaprine (FLEXERIL) tablet 5 mg (has no administration in time range)  omega-3 acid ethyl esters (LOVAZA) capsule 1 g (has no administration in time range)  albuterol (PROVENTIL) (2.5 MG/3ML) 0.083% nebulizer solution 3 mL (has no administration in time range)  acetaminophen (TYLENOL) tablet  650 mg (has no administration in time range)    Or  acetaminophen (TYLENOL) suppository 650 mg (has no administration in time range)  ondansetron (ZOFRAN) tablet 4 mg (has no administration in time range)    Or  ondansetron (ZOFRAN) injection 4 mg (has no administration in time range)  insulin aspart (novoLOG) injection 0-9 Units (has no administration in time range)  enoxaparin (LOVENOX) injection 40 mg (has no administration in time range)  clopidogrel (PLAVIX) tablet 300 mg (has no administration in time range)  clopidogrel (PLAVIX) tablet 75 mg (has no administration in time range)  iopamidol (ISOVUE-370) 76 % injection (has no administration in time range)  sodium chloride flush (NS) 0.9 % injection 3 mL (3 mLs Intravenous Given 06/16/18 1944)  aspirin chewable tablet 324 mg (324 mg Oral Given 06/16/18 2000)  lactated ringers bolus 1,000 mL (0 mLs Intravenous Stopped 06/16/18 2148)    Mobility walks Low fall risk   Focused Assessments NIH 1   R Recommendations: See Admitting Provider Note  Report given to:   Additional Notes:

## 2018-06-17 NOTE — NC FL2 (Signed)
[place] LEVEL OF CARE SCREENING TOOL     IDENTIFICATION  Patient Name: Caitlin Oliver Birthdate: [birth year] Sex: female Admission Date (Current Location): 06/16/2018  [facility] and [place] Number:  Herbalist and Address:  [facility] [place], [place] [address] [place], [place] [address]      Provider Number: [id number]  Attending Physician Name and Address:  Thurnell Lose, MD  Relative Name and Phone Number:  Alroy Dust, brother [phone]    Current Level of Care: Hospital Recommended Level of Care: [place] Prior Approval Number:    Date Approved/Denied:   PASRR Number: [id number]  Discharge Plan: SNF    Current Diagnoses: Patient Active Problem List   Diagnosis Date Noted  . Acute CVA (cerebrovascular accident) ([place]) 06/17/2018  . Syncope 06/16/2018  . Chest pain 06/16/2018  . Pain 09/23/2017  . Cervicalgia 07/16/2017  . Pain in both upper extremities 06/03/2017  . Pain in both lower extremities 06/03/2017  . Bilateral carotid artery stenosis 06/03/2017  . Benign paroxysmal positional vertigo 10/23/2016  . Headache 06/19/2016  . Cancer of left lung ([place]) 02/03/2016  . COPD GOLD II 12/08/2015  . Solitary pulmonary nodule 12/08/2015  . Bilateral occipital neuralgia 11/22/2015  . Double vision 10/28/2015  . TIA (transient ischemic attack) 10/28/2015  . Subclavian artery stenosis, left ([place]) 10/28/2015  . Subclavian steal syndrome 10/28/2015  . Acute blood loss anemia 10/20/2013  . C. difficile colitis 10/19/2013  . GI bleed 10/18/2013  . COPD 10/18/2013  . Bleeding gastrointestinal 10/18/2013  . Hypoxia 10/10/2013  . Bronchitis 10/08/2013  . PVD (peripheral vascular disease) with claudication ([place]) 07/29/2013  . Carotid stenosis 05/07/2013  . Stenosis of left carotid artery 05/07/2013  . Occlusion and stenosis of carotid artery without mention of cerebral infarction 03/02/2013  . Carotid artery  obstruction 03/02/2013  . Carotid artery disease ([place]) 02/27/2013  . Tobacco abuse 02/27/2013  . Compulsive tobacco user syndrome 02/27/2013  . Claudication ([place]) 01/20/2013  . Essential hypertension 01/20/2013  . Type 2 diabetes mellitus ([place]) 01/20/2013  . Hyperlipidemia 01/20/2013    Orientation RESPIRATION BLADDER Height & Weight     Self, Time, Situation, Place  O2(Nasal cannula 2L) Continent Weight: 87.3 kg Height:     BEHAVIORAL SYMPTOMS/MOOD NEUROLOGICAL BOWEL NUTRITION STATUS      Continent Diet(Please see DC Summary)  AMBULATORY STATUS COMMUNICATION OF NEEDS Skin   Limited Assist Verbally Normal                       Personal Care Assistance Level of Assistance  Bathing, Feeding, Dressing Bathing Assistance: Limited assistance Feeding assistance: Limited assistance Dressing Assistance: Limited assistance     Functional Limitations Info  Sight, Hearing, Speech Sight Info: Adequate Hearing Info: Adequate Speech Info: Adequate    SPECIAL CARE FACTORS FREQUENCY  PT (By licensed PT), OT (By licensed OT)     PT Frequency: 5x/week OT Frequency: 3x/week            Contractures Contractures Info: Not present    Additional Factors Info  Code Status, Allergies, Psychotropic, Insulin Sliding Scale Code Status Info: Full Allergies Info: NKA Psychotropic Info: Cymbalta Insulin Sliding Scale Info: 3x daily with meals       Current Medications (06/17/2018):  This is the current hospital active medication list Current Facility-Administered Medications  Medication Dose Route Frequency Provider Last Rate Last Dose  . acetaminophen (TYLENOL) tablet 650 mg  650 mg Oral  Q6H PRN Rise Patience, MD      . albuterol (PROVENTIL) (2.5 MG/3ML) 0.083% nebulizer solution 3 mL  3 mL Inhalation Q6H PRN Rise Patience, MD      . aspirin tablet 325 mg  325 mg Oral Daily Rise Patience, MD   325 mg at 06/17/18 1014  . [START ON 06/18/2018] clopidogrel (PLAVIX)  tablet 75 mg  75 mg Oral Daily Greta Doom, MD      . cyclobenzaprine (FLEXERIL) tablet 5 mg  5 mg Oral TID PRN Rise Patience, MD   5 mg at 06/17/18 1322  . diltiazem (CARDIZEM CD) 24 hr capsule 180 mg  180 mg Oral Daily Rise Patience, MD   180 mg at 06/17/18 1013  . DULoxetine (CYMBALTA) DR capsule 60 mg  60 mg Oral QHS Rise Patience, MD   60 mg at 06/17/18 0246  . enoxaparin (LOVENOX) injection 40 mg  40 mg Subcutaneous Daily Rise Patience, MD   40 mg at 06/17/18 1322  . hydrALAZINE (APRESOLINE) injection 10 mg  10 mg Intravenous Q6H PRN Thurnell Lose, MD      . HYDROcodone-acetaminophen (NORCO/VICODIN) 5-325 MG per tablet 1 tablet  1 tablet Oral Q6H PRN Rise Patience, MD      . insulin aspart (novoLOG) injection 0-9 Units  0-9 Units Subcutaneous TID WC Rise Patience, MD   3 Units at 06/17/18 1246  . iopamidol (ISOVUE-370) 76 % injection           . lactated ringers infusion   Intravenous Continuous Thurnell Lose, MD 75 mL/hr at 06/17/18 1247    . metoprolol tartrate (LOPRESSOR) tablet 37.5 mg  37.5 mg Oral BID WC Rise Patience, MD   37.5 mg at 06/17/18 1013  . omega-3 acid ethyl esters (LOVAZA) capsule 1 g  1 g Oral Daily Rise Patience, MD   1 g at 06/17/18 1014  . ondansetron (ZOFRAN) injection 4 mg  4 mg Intravenous Q6H PRN Rise Patience, MD      . pantoprazole (PROTONIX) EC tablet 40 mg  40 mg Oral BID Rise Patience, MD   40 mg at 06/17/18 1014  . PERFLUTREN LIPID MICROSPHERE injection SUSP           . pravastatin (PRAVACHOL) tablet 40 mg  40 mg Oral QAC supper Rise Patience, MD         Discharge Medications: Please see discharge summary for a list of discharge medications.  Relevant Imaging Results:  Relevant Lab Results:   Additional Information SSN: [place]all [place], [place]

## 2018-06-17 NOTE — Progress Notes (Signed)
OT Cancellation Note  Patient Details Name: LUCILL MAUCK MRN: [id number] DOB: 1940/05/06   Cancelled Treatment:    Reason Eval/Treat Not Completed: Other (comment)  PT with breakfat tray and requested to finish eating. Will return when able.  Ramond Dial, OT/L   Acute OT Clinical Specialist Acute Rehabilitation Services Pager [phone] Office [phone]  06/17/2018, 11:08 AM

## 2018-06-17 NOTE — Evaluation (Signed)
Physical Therapy Evaluation Patient Details Name: Caitlin Oliver MRN: [id number] DOB: 1940-07-11 Today's Date: 06/17/2018   History of Present Illness  Caitlin BELGARDE is a 78 y.o. female with history of hypertension, hyperlipidemia, peripheral vascular disease status post bilateral carotid endarterectomy and lower extremity stent placement, depression, diabetes mellitus type 2 was brought to the ER after patient had a brief episode of syncope while walking to go to the PCPs office.  Over the last 3 days patient has been finding increasingly difficult to walk with ataxia and staggering towards the left side.  Also has been having some slurred speech.  MRI showed Punctate 5 mm acute ischemic nonhemorrhagic cortical infarct involving the right precentral gyrus.  Clinical Impression  Pt admitted with/for s/s of stroke.  Left side is notably weaker than right, but not really focal in nature.  Pt needing min guard to min assist for basic mobility/gait.  Pt currently limited functionally due to the problems listed. ( See problems list.)   Pt will benefit from PT to maximize function and safety in order to get ready for next venue listed below.  Pt was intructed in signs of a risk factors for stroke.  She is unsure at this point if she can handle herself at this level of function without consistent assist     Follow Up Recommendations SNF;Supervision/Assistance - 24 hour    Equipment Recommendations       Recommendations for Other Services       Precautions / Restrictions Precautions Precautions: Fall      Mobility  Bed Mobility Overal bed mobility: Needs Assistance Bed Mobility: Rolling;Sidelying to Sit Rolling: Min guard Sidelying to sit: Min guard       General bed mobility comments: up via right elbow with mild effort, flat bed, no rail, no assist  Transfers Overall transfer level: Needs assistance Equipment used: Rolling walker (2 wheeled);None Transfers: Sit to/from Stand Sit to  Stand: Min guard         General transfer comment: cues for hand placement, mildly effortful.  Ambulation/Gait Ambulation/Gait assistance: Min assist Gait Distance (Feet): 50 Feet(x2 with sitting rest in between) Assistive device: Rolling walker (2 wheeled)(rail) Gait Pattern/deviations: Step-through pattern Gait velocity: slower Gait velocity interpretation: <1.31 ft/sec, indicative of household ambulator General Gait Details: mildly unsteady from weakness with RW, with drifting to the Left and light minimal assist.  Without AD, pt much more unsteady with shorter step length, not really paretic in nature, but left side weak, more drift left.  pt reaching for the rail as quickly as possible.  Stairs            Wheelchair Mobility    Modified Rankin (Stroke Patients Only) Modified Rankin (Stroke Patients Only) Pre-Morbid Rankin Score: No symptoms Modified Rankin: Moderately severe disability     Balance Overall balance assessment: Needs assistance Sitting-balance support: No upper extremity supported Sitting balance-Leahy Scale: Fair     Standing balance support: No upper extremity supported;Single extremity supported Standing balance-Leahy Scale: Fair                               Pertinent Vitals/Pain Pain Assessment: Faces Faces Pain Scale: Hurts little more Pain Location: left rib  Pain Descriptors / Indicators: Cramping;Grimacing;Guarding Pain Intervention(s): Monitored during session    Home Living Family/patient expects to be discharged to:: Private residence Living Arrangements: Alone Available Help at Discharge: Family;Available PRN/intermittently(brother and nephew) Type of Home: House  Home Access: Stairs to enter Entrance Stairs-Rails: Right Entrance Stairs-Number of Steps: 6 Home Layout: One level Home Equipment: Walker - 2 wheels(broom stick)      Prior Function Level of Independence: Independent with assistive device(s)          Comments: Mod I with stick or RW in/out of home.  Used scooters for grocery and big box shopping.     Hand Dominance   Dominant Hand: Right    Extremity/Trunk Assessment   Upper Extremity Assessment Upper Extremity Assessment: Defer to OT evaluation    Lower Extremity Assessment Lower Extremity Assessment: LLE deficits/detail;RLE deficits/detail RLE Deficits / Details: general weakness at 4/5, intact Light touch. LLE Deficits / Details: noticably weaker than right.  hip flexors, hams, pf weak at 4-, quads 4/5 LLE Coordination: decreased fine motor       Communication   Communication: (slurred speech fully intelligible)  Cognition Arousal/Alertness: Awake/alert Behavior During Therapy: WFL for tasks assessed/performed Overall Cognitive Status: Difficult to assess                                 General Comments: pt is initially distractible, but then focused adequately.  Follows direction for eval.      General Comments General comments (skin integrity, edema, etc.): pt report feeling generally weak with resultant unsteadiness overall.  SpO2 difficult to ascertain due to painted nails, but one good waveform showed 87% immediately after gait.    Exercises     Assessment/Plan    PT Assessment Patient needs continued PT services  PT Problem List Decreased strength;Decreased activity tolerance;Decreased balance;Decreased mobility;Decreased knowledge of use of DME;Cardiopulmonary status limiting activity       PT Treatment Interventions Gait training;Functional mobility training;Stair training;DME instruction;Therapeutic activities;Balance training;Patient/family education    PT Goals (Current goals can be found in the Care Plan section)  Acute Rehab PT Goals Patient Stated Goal: Get back home and able to do for myself PT Goal Formulation: With patient Time For Goal Achievement: 07/01/18 Potential to Achieve Goals: Good    Frequency Min 3X/week    Barriers to discharge Decreased caregiver support      Co-evaluation               AM-PAC PT "6 Clicks" Mobility  Outcome Measure Help needed turning from your back to your side while in a flat bed without using bedrails?: None Help needed moving from lying on your back to sitting on the side of a flat bed without using bedrails?: A Little Help needed moving to and from a bed to a chair (including a wheelchair)?: A Little Help needed standing up from a chair using your arms (e.g., wheelchair or bedside chair)?: A Little Help needed to walk in hospital room?: A Little Help needed climbing 3-5 steps with a railing? : A Lot 6 Click Score: 18    End of Session Equipment Utilized During Treatment: Oxygen Activity Tolerance: Patient tolerated treatment well;Patient limited by fatigue Patient left: in chair;with call bell/phone within reach;with chair alarm set Nurse Communication: Mobility status PT Visit Diagnosis: Unsteadiness on feet (R26.81);Muscle weakness (generalized) (M62.81);Other symptoms and signs involving the nervous system (R29.898)    Time: 1015-1050 PT Time Calculation (min) (ACUTE ONLY): 35 min   Charges:   PT Evaluation $PT Eval Moderate Complexity: 1 Mod PT Treatments $Gait Training: 8-22 mins        06/17/2018  Donnella Sham, PT Acute Rehabilitation Services  [phone]  (pager) [phone]  (office)  Tessie Fass [place] Sara 06/17/2018, 11:59 AM

## 2018-06-17 NOTE — ED Notes (Signed)
Patient denies pain and is resting comfortably.  

## 2018-06-17 NOTE — Discharge Summary (Signed)
Caitlin Oliver IWO:[id number] DOB: [birth year] DOA: 06/16/2018  PCP: [name], PA-C  Admit date: 06/16/2018  Discharge date: 06/20/2018  Admitted From: Home   Disposition:  SNF   Recommendations for Outpatient Follow-up:   Follow up with PCP in 1-2 weeks  PCP Please obtain BMP/CBC, 2 view CXR in 1week,  (see Discharge instructions)   PCP Please follow up on the following pending results: Monitor CBGs, monitor BMP, needs outpatient endocrine and pulmonary follow-up   Home Health: PT, RN   Equipment/Devices: None  Consultations: Neuro Discharge Condition: Stable   CODE STATUS: Full   Diet Recommendation: Heart Healthy low carbohydrate diet    Chief Complaint  Patient presents with  . Altered Mental Status     Brief history of present illness from the day of admission and additional interim summary    Caitlin Oliver is a 78 y.o. female with history of hypertension, hyperlipidemia, peripheral vascular disease status post bilateral carotid endarterectomy and lower extremity stent placement, depression, diabetes mellitus type 2 was brought to the ER after patient had a brief episode of syncope while walking to go to the PCPs office.  Over the last 3 days patient has been finding increasingly difficult to walk with ataxia and staggering towards the left side.  She was diagnosed with CVA/TIA and admitted for further work-up.                                                                 Hospital Course     1.  Right precentral gyrus 5 mm acute ischemic stroke.  Symptoms have resolved, A1c elevated, LDL at goal, seen by neuro, case discussed with Dr. Leonie Man, echo stable, currently on dual antiplatelet therapy along with statin which will be continued.  PT OT and speech evaluation.  Patient overall quite weak and  deconditioned per PT SNF.  Patient not safe for home discharge per PT, await SNF Bed.  2. Dyslipidemia.  LDL uncontrolled continue home dose statin.  3.  Newly diagnosed chronic severe pulmonary hypertension.  Diagnosed on echocardiogram this admission.  She likely has undiagnosed obstructive sleep apnea, recommend outpatient sleep study, work-up here for severe pulmonary hypertension was unremarkable including CTA, lower extremity venous duplex, d-dimer was borderline positive.  Will request patient to have outpatient pulmonary follow-up and sleep study.  Currently compensated. For now 2 L nasal cannula oxygen at all times.  4.  Chronic diastolic CHF with EF 50%.  Currently compensated.  5.  Thyroid nodule noted on CTA.  Stable thyroid enzymes including stable TSH free T4 and T3, thyroid ultrasound does show a suspicious nodule.  Will defer to PCP, she will eventually require needle aspiration and biopsy.  6.  Hypertension.  Stable on Cardizem and beta-blocker combination beta-blocker dose has been increased.  Monitor blood pressure at SNF and adjust as needed.  7. PAD and history of carotid artery disease.  Has had carotid surgery in the past with Dr. Trula Slade, currently on dual antiplatelet therapy and statin, CT angiogram had nonspecific changes for which carotid venous duplex was done on the recommendation of Dr. Leonie Man, this is unremarkable we will have patient follow with her vascular surgeon post discharge.  8.  HX of lung cancer status post lobectomy.  No acute issues.  9.  Syncope due to dehydration deconditioning.  Was stable on telemetry hydrated with IV fluids, echo noted, PT, SNF.  Continue to monitor orthostatics at SNF, stable now.  10.  ARF due to dehydration.  Resolved after IV fluids.  11.  DM type II.  Poor control due to hyperglycemia.  DM and insulin education, Lantus and sliding scale started.  Lab Results  Component Value Date   HGBA1C 7.9 (H) 06/17/2018    CBG (last 3)  Recent Labs    06/19/18 2322 06/20/18 0439 06/20/18 0727  GLUCAP 185* 108* 108*     Discharge diagnosis     Principal Problem:   Acute CVA (cerebrovascular accident) [facility]) Active Problems:   Essential hypertension   Type 2 diabetes mellitus ([place])   PVD (peripheral vascular disease) with claudication ([facility])   COPD   Cancer of left lung ([place])   Syncope   Chest pain    Discharge instructions    Discharge Instructions    Discharge instructions   Complete by:  As directed    Follow with Primary MD [name], PA-C in 7 days   Get CBC, CMP  checked  by SNF MD  in 7 days   Activity: As tolerated with Full fall precautions use walker/cane & assistance as needed  Disposition SNF  Diet: Heart Healthy - Low Carb  Accuchecks 4 times/day, Once in AM empty stomach and then before each meal. Log in all results and show them to your Prim.MD in 3 days. If any glucose reading is under 80 or above 300 call your Prim MD immidiately. Follow Low glucose instructions for glucose under 80 as instructed.   Special Instructions: If you have smoked or chewed Tobacco  in the last 2 yrs please stop smoking, stop any regular Alcohol  and or any Recreational drug use.  On your next visit with your primary care physician please Get Medicines reviewed and adjusted.  Please request your Prim.MD to go over all Hospital Tests and Procedure/Radiological results at the follow up, please get all Hospital records sent to your Prim MD by signing hospital release before you go home.  If you experience worsening of your admission symptoms, develop shortness of breath, life threatening emergency, suicidal or homicidal thoughts you must seek medical attention immediately by calling 911 or calling your MD immediately  if symptoms less severe.  You Must read complete instructions/literature along with all the possible adverse reactions/side effects for all the Medicines you take and  that have been prescribed to you. Take any new Medicines after you have completely understood and accpet all the possible adverse reactions/side effects.   Increase activity slowly   Complete by:  As directed       Discharge Medications   Allergies as of 06/20/2018      Reactions   No Known Allergies Other (See Comments)      Medication List    STOP taking these medications   aspirin EC 81 MG tablet   cyclobenzaprine 5  MG tablet Commonly known as:  FLEXERIL   diazepam 5 MG tablet Commonly known as:  VALIUM   GOODY HEADACHE PO   metFORMIN 500 MG tablet Commonly known as:  GLUCOPHAGE     TAKE these medications   albuterol 108 (90 Base) MCG/ACT inhaler Commonly known as:  PROVENTIL HFA;VENTOLIN HFA Inhale 2 puffs into the lungs every 6 (six) hours as needed for wheezing or shortness of breath.   BIOTIN 5000 PO Take 5,000 mg by mouth daily before supper.   Calcium 600 600 MG Tabs tablet Generic drug:  calcium carbonate Take 600 mg by mouth daily before supper.   clopidogrel 75 MG tablet Commonly known as:  PLAVIX Take 1 tablet (75 mg total) by mouth daily.   diltiazem 180 MG 24 hr capsule Commonly known as:  DILACOR XR Take 180 mg by mouth daily.   DULoxetine 60 MG capsule Commonly known as:  Cymbalta Take 1 capsule (60 mg total) by mouth daily. What changed:  when to take this   FISH OIL PO Take 1 capsule by mouth daily.   HYDROcodone-acetaminophen 5-325 MG tablet Commonly known as:  NORCO/VICODIN Take 1 tablet by mouth every 12 (twelve) hours as needed for moderate pain. What changed:  when to take this   ICY HOT EX Apply 1 spray topically every 8 (eight) hours as needed (pain).   insulin aspart 100 UNIT/ML injection Commonly known as:  NovoLOG Substitute to any brand approved.Before each meal 3 times a day, 140-199 - 2 units, 200-250 - 4 units, 251-299 - 6 units,  300-349 - 8 units,  350 or above 10 units. Dispense syringes and needles as needed, Ok to  switch to PEN if approved. DX DM2, Code E11.65   insulin glargine 100 UNIT/ML injection Commonly known as:  Lantus Inject 0.08 mLs (8 Units total) into the skin at bedtime. Dispense insulin pen if approved, if not dispense as needed syringes and needles for 1 month supply. Can switch to Levemir. Diagnosis E 11.65.   MAGNESIUM PO Take 1 tablet by mouth daily before supper.   metoprolol tartrate 50 MG tablet Commonly known as:  LOPRESSOR Take 1 tablet (50 mg total) by mouth 2 (two) times daily with a meal. What changed:    medication strength  how much to take   ondansetron 4 MG tablet Commonly known as:  ZOFRAN Take 4 mg by mouth at bedtime as needed for nausea or vomiting.   ONE TOUCH ULTRA TEST test strip Generic drug:  glucose blood   pantoprazole 40 MG tablet Commonly known as:  PROTONIX Take 40 mg by mouth 2 (two) times daily.   pravastatin 40 MG tablet Commonly known as:  PRAVACHOL Take 40 mg by mouth daily before supper.       Follow-up Information    [name], PA-C. Schedule an appointment as soon as possible for a visit in 1 week(s).   Specialty:  Family Medicine Contact information: [address] [place] [address] [phone]        Ashley. Schedule an appointment as soon as possible for a visit in 1 month(s).   Why:  [facility]A Contact information: [address]     [place] [phone]          Major procedures and Radiology Reports - PLEASE review detailed and final reports thoroughly  -     TTE   1. The left ventricle has normal systolic function with  an ejection fraction of 60-65%. The cavity size was normal. Moderate septal hypertrophy. Left ventricular diastolic Doppler parameters are consistent with impaired relaxation.  2. The right ventricle has mildly reduced systolic function. The cavity was mildly enlarged. There is no increase in right ventricular wall  thickness. Right ventricular systolic pressure is severely elevated with an estimated pressure of 69.9 mmHg.  3. Left atrial size was moderately dilated.  4. The mitral valve is normal in structure.  5. The tricuspid valve is normal in structure.  6. The aortic valve is normal in structure.  7. The pulmonic valve was normal in structure.  8. There is mild dilatation of the ascending aorta.   Ct Angio Head W Or Wo Contrast  Result Date: 06/17/2018 CLINICAL DATA:  Follow-up examination for acute stroke. EXAM: CT ANGIOGRAPHY HEAD AND NECK TECHNIQUE: Multidetector CT imaging of the head and neck was performed using the standard protocol during bolus administration of intravenous contrast. Multiplanar CT image reconstructions and MIPs were obtained to evaluate the vascular anatomy. Carotid stenosis measurements (when applicable) are obtained utilizing NASCET criteria, using the distal internal carotid diameter as the denominator. CONTRAST:  36m ISOVUE-370 IOPAMIDOL (ISOVUE-370) INJECTION 76% COMPARISON:  Prior MRI from earlier same day. FINDINGS: CTA NECK FINDINGS Aortic arch: Visualized aortic arch of normal caliber with normal branch pattern. Heavy atheromatous plaque about the visualized arch and origin of the great vessels. Associated stenosis of up to 60% at the right brachiocephalic artery. Narrowing short-segment severe approximate 80% stenosis at the proximal left common carotid artery (series 8, image 297). Heavy atheromatous plaque at the origin of the left subclavian artery with left subclavian stent in place. Widely patent flow through the stent without significant intra stent stenosis. Additional approximate 50% stenosis at the mid left subclavian artery (series 8, image 267). Focal stenosis of up to approximate 75% at the origin of the right subclavian artery. Right carotid system: Atheromatous plaque at the origin of the right common carotid artery with associated stenosis of up to approximate  60-70% by NASCET criteria. Additional scattered plaque within the right CCA distally with scattered mild multifocal narrowing. Prior right carotid endarterectomy without significant residual stenosis. Right ICA widely patent at its proximal and mid aspect. Atheromatous plaque at the distal right ICA with associated stenosis of up to 70% by NASCET criteria (series 8, image 146). No acute vascular abnormality within the right carotid artery system. Left carotid system: Focal 80% stenosis at the proximal left common carotid artery again noted. Additional scattered plaque within the mid and distal left CCA with up to approximate 50% stenosis by NASCET criteria. Prior left carotid endarterectomy with residual mild 20% narrowing of the proximal left ICA. Left ICA otherwise widely patent distally without stenosis or acute vascular abnormality. Short-segment fenestration versus chronic dissection flap noted at the proximal left external carotid artery (series 8, image 194). Vertebral arteries: Both vertebral arteries arise from the subclavian arteries. Left vertebral artery dominant and widely patent within the neck. Markedly attenuated flow within the right vertebral artery, most likely related to the previously identified high-grade proximal right subclavian artery stenosis. Right vertebral artery otherwise grossly patent without appreciable stenosis. Skeleton: No acute osseous abnormality. No discrete lytic or blastic osseous lesions. Degenerative spondylolysis noted at C5-6. Other neck: No acute soft tissue abnormality within the neck. No adenopathy. Heterogeneous left thyroid nodule measures up to approximately 3.2 cm. Upper chest: Partially visualized upper chest demonstrates no acute finding. Centrilobular and paraseptal emphysematous changes with superimposed atelectasis noted.  Pleuroparenchymal thickening about the partially visualized left lung suspected to at least in part be postoperative related to prior  lobectomy. Review of the MIP images confirms the above findings CTA HEAD FINDINGS Anterior circulation: Petrous segments widely patent. Scattered atheromatous plaque within the cavernous/supraclinoid ICAs with resultant mild to moderate diffuse narrowing bilaterally (no more than 50%). ICA termini well perfused. A1 segments widely patent. Normal anterior communicating artery. Anterior cerebral arteries widely patent to their distal aspects. No M1 stenosis or occlusion. Distal MCA branches well perfused and symmetric. Posterior circulation: Vertebral arteries widely patent to the vertebrobasilar junction without stenosis. Adequate opacification of the right V4 segment likely at least partially retrograde in nature across the vertebrobasilar junction. Posterior inferior cerebral arteries patent bilaterally. Basilar widely patent proximally, with mild narrowing at the basilar tip. Superior cerebral arteries patent bilaterally. Probable duplicated right SCA noted. Both of the posterior cerebral arteries primarily supplied via the basilar. PCAs widely patent to their distal aspects. Small bilateral posterior communicating arteries noted. Venous sinuses: Patent. Anatomic variants: None significant. Delayed phase: No abnormal enhancement. Review of the MIP images confirms the above findings IMPRESSION: 1. Negative CTA for large vessel occlusion. 2. Severe atheromatous disease about the aortic arch and origin of the great vessels. Resultant stenoses of up to 65% at the right brachiocephalic artery, 17% at the proximal left common carotid artery, 60-70% at the origin of the right common carotid artery and 75% at the proximal right subclavian artery. Vascular stent in place at the origin of the left subclavian artery with widely patent flow. 3. Sequelae of prior bilateral CEAs without significant residual stenosis. Atheromatous stenosis of up to 70% at the distal right ICA. No other significant stenosis about the left ICA. 4.  Relative sparing of the posterior circulation with wide patency of the vertebrobasilar system. Attenuated flow within the right vertebral artery likely related to the above-mentioned proximal right subclavian artery stenosis. 5. 3.2 cm left thyroid if not already performed. Dedicated nonemergent thyroid ultrasound recommended 6. Emphysema with postoperative changes within the partially visualized left lung. Electronically Signed   By: Jeannine Boga M.D.   On: 06/17/2018 04:34   Ct Head Wo Contrast  Result Date: 06/16/2018 CLINICAL DATA:  Focal neurologic deficits with confusion and weakness EXAM: CT HEAD WITHOUT CONTRAST TECHNIQUE: Contiguous axial images were obtained from the base of the skull through the vertex without intravenous contrast. COMPARISON:  10/20/2015 FINDINGS: Brain: Diffuse moderate to marked small vessel ischemic disease of periventricular and subcortical white matter without acute hemorrhage, intra-axial mass nor extra-axial fluid collections. No midline shift or edema. No large vascular territory infarct. Global atrophy is stable. Vascular: Atherosclerosis of the carotid siphons. No hyperdense vessel sign. Skull: Mild osseous demineralization skull base without acute fracture or bone destruction. Sinuses/Orbits: No acute finding. Other: None IMPRESSION: Atrophy with chronic, largely stable degree of moderate to marked small vessel ischemic disease of periventricular and subcortical white matter. No acute intracranial abnormality. Electronically Signed   By: Ashley Royalty M.D.   On: 06/16/2018 18:14   Ct Angio Neck W Or Wo Contrast  Result Date: 06/17/2018 CLINICAL DATA:  Follow-up examination for acute stroke. EXAM: CT ANGIOGRAPHY HEAD AND NECK TECHNIQUE: Multidetector CT imaging of the head and neck was performed using the standard protocol during bolus administration of intravenous contrast. Multiplanar CT image reconstructions and MIPs were obtained to evaluate the vascular  anatomy. Carotid stenosis measurements (when applicable) are obtained utilizing NASCET criteria, using the distal internal carotid diameter as the  denominator. CONTRAST:  61m ISOVUE-370 IOPAMIDOL (ISOVUE-370) INJECTION 76% COMPARISON:  Prior MRI from earlier same day. FINDINGS: CTA NECK FINDINGS Aortic arch: Visualized aortic arch of normal caliber with normal branch pattern. Heavy atheromatous plaque about the visualized arch and origin of the great vessels. Associated stenosis of up to 60% at the right brachiocephalic artery. Narrowing short-segment severe approximate 80% stenosis at the proximal left common carotid artery (series 8, image 297). Heavy atheromatous plaque at the origin of the left subclavian artery with left subclavian stent in place. Widely patent flow through the stent without significant intra stent stenosis. Additional approximate 50% stenosis at the mid left subclavian artery (series 8, image 267). Focal stenosis of up to approximate 75% at the origin of the right subclavian artery. Right carotid system: Atheromatous plaque at the origin of the right common carotid artery with associated stenosis of up to approximate 60-70% by NASCET criteria. Additional scattered plaque within the right CCA distally with scattered mild multifocal narrowing. Prior right carotid endarterectomy without significant residual stenosis. Right ICA widely patent at its proximal and mid aspect. Atheromatous plaque at the distal right ICA with associated stenosis of up to 70% by NASCET criteria (series 8, image 146). No acute vascular abnormality within the right carotid artery system. Left carotid system: Focal 80% stenosis at the proximal left common carotid artery again noted. Additional scattered plaque within the mid and distal left CCA with up to approximate 50% stenosis by NASCET criteria. Prior left carotid endarterectomy with residual mild 20% narrowing of the proximal left ICA. Left ICA otherwise widely  patent distally without stenosis or acute vascular abnormality. Short-segment fenestration versus chronic dissection flap noted at the proximal left external carotid artery (series 8, image 194). Vertebral arteries: Both vertebral arteries arise from the subclavian arteries. Left vertebral artery dominant and widely patent within the neck. Markedly attenuated flow within the right vertebral artery, most likely related to the previously identified high-grade proximal right subclavian artery stenosis. Right vertebral artery otherwise grossly patent without appreciable stenosis. Skeleton: No acute osseous abnormality. No discrete lytic or blastic osseous lesions. Degenerative spondylolysis noted at C5-6. Other neck: No acute soft tissue abnormality within the neck. No adenopathy. Heterogeneous left thyroid nodule measures up to approximately 3.2 cm. Upper chest: Partially visualized upper chest demonstrates no acute finding. Centrilobular and paraseptal emphysematous changes with superimposed atelectasis noted. Pleuroparenchymal thickening about the partially visualized left lung suspected to at least in part be postoperative related to prior lobectomy. Review of the MIP images confirms the above findings CTA HEAD FINDINGS Anterior circulation: Petrous segments widely patent. Scattered atheromatous plaque within the cavernous/supraclinoid ICAs with resultant mild to moderate diffuse narrowing bilaterally (no more than 50%). ICA termini well perfused. A1 segments widely patent. Normal anterior communicating artery. Anterior cerebral arteries widely patent to their distal aspects. No M1 stenosis or occlusion. Distal MCA branches well perfused and symmetric. Posterior circulation: Vertebral arteries widely patent to the vertebrobasilar junction without stenosis. Adequate opacification of the right V4 segment likely at least partially retrograde in nature across the vertebrobasilar junction. Posterior inferior cerebral  arteries patent bilaterally. Basilar widely patent proximally, with mild narrowing at the basilar tip. Superior cerebral arteries patent bilaterally. Probable duplicated right SCA noted. Both of the posterior cerebral arteries primarily supplied via the basilar. PCAs widely patent to their distal aspects. Small bilateral posterior communicating arteries noted. Venous sinuses: Patent. Anatomic variants: None significant. Delayed phase: No abnormal enhancement. Review of the MIP images confirms the above findings IMPRESSION: 1.  Negative CTA for large vessel occlusion. 2. Severe atheromatous disease about the aortic arch and origin of the great vessels. Resultant stenoses of up to 65% at the right brachiocephalic artery, 09% at the proximal left common carotid artery, 60-70% at the origin of the right common carotid artery and 75% at the proximal right subclavian artery. Vascular stent in place at the origin of the left subclavian artery with widely patent flow. 3. Sequelae of prior bilateral CEAs without significant residual stenosis. Atheromatous stenosis of up to 70% at the distal right ICA. No other significant stenosis about the left ICA. 4. Relative sparing of the posterior circulation with wide patency of the vertebrobasilar system. Attenuated flow within the right vertebral artery likely related to the above-mentioned proximal right subclavian artery stenosis. 5. 3.2 cm left thyroid if not already performed. Dedicated nonemergent thyroid ultrasound recommended 6. Emphysema with postoperative changes within the partially visualized left lung. Electronically Signed   By: Jeannine Boga M.D.   On: 06/17/2018 04:34   Ct Angio Chest Pe W Or Wo Contrast  Result Date: 06/18/2018 CLINICAL DATA:  78 year old female with history of shortness of breath. EXAM: CT ANGIOGRAPHY CHEST WITH CONTRAST TECHNIQUE: Multidetector CT imaging of the chest was performed using the standard protocol during bolus administration of  intravenous contrast. Multiplanar CT image reconstructions and MIPs were obtained to evaluate the vascular anatomy. CONTRAST:  32m OMNIPAQUE IOHEXOL 350 MG/ML SOLN COMPARISON:  Chest CT 03/10/2018. FINDINGS: Cardiovascular: No filling defects within the pulmonary arterial tree to suggest underlying pulmonary embolism. Heart size is mildly enlarged with right atrial and right ventricular dilatation. There is no significant pericardial fluid, thickening or pericardial calcification. There is aortic atherosclerosis, as well as atherosclerosis of the great vessels of the mediastinum and the coronary arteries, including calcified atherosclerotic plaque in the left main, left anterior descending, left circumflex and right coronary arteries. Dilatation of the pulmonic trunk (3.6 cm in diameter). Mediastinum/Nodes: No pathologically enlarged mediastinal or hilar lymph nodes. Esophagus is unremarkable in appearance. No axillary lymphadenopathy. Lungs/Pleura: Status post left upper lobectomy. No acute consolidative airspace disease. Enlarging small left and trace right pleural effusions. Previously noted small nodule in the superior segment of the right lower lobe unchanged, measuring 7 x 5 mm (mean diameter of 6 mm). Focal area of architectural distortion in the lateral segment of the right middle lobe, favored to reflect an area of atelectasis. Mild scarring and/or atelectasis in the base of the left lower lobe. Upper Abdomen: Diffuse low attenuation throughout the visualized hepatic parenchyma, indicative of hepatic steatosis. Aortic atherosclerosis. Musculoskeletal: There are no aggressive appearing lytic or blastic lesions noted in the visualized portions of the skeleton. Review of the MIP images confirms the above findings. IMPRESSION: 1. No evidence of pulmonary embolism. 2. Enlarging bilateral pleural effusions, small on the left and trace on the right. 3. Cardiomegaly with right atrial and right ventricular  dilatation, as well as dilatation of the pulmonic trunk (3.6 cm in diameter). Findings suggest pulmonary arterial hypertension. 4. Aortic atherosclerosis, in addition to left main and 3 vessel coronary artery disease. Assessment for potential risk factor modification, dietary therapy or pharmacologic therapy may be warranted, if clinically indicated. 5. Stable 6 mm nodule in the superior segment of the right lower lobe. This is reassuring, but the time interval is insufficient to ensure a benign etiology. Repeat noncontrast chest CT is recommended in 12 months to ensure continued stability. 6. Hepatic steatosis. Aortic Atherosclerosis (ICD10-I70.0). Electronically Signed   By: DQuillian Quince  Entrikin M.D.   On: 06/18/2018 20:38   Mr Brain Wo Contrast  Result Date: 06/17/2018 CLINICAL DATA:  Initial evaluation for gradually progressive weakness for several days, ataxia. EXAM: MRI HEAD WITHOUT CONTRAST TECHNIQUE: Multiplanar, multiecho pulse sequences of the brain and surrounding structures were obtained without intravenous contrast. COMPARISON:  Prior CT from 06/16/2018 as well as previous MRI from 06/13/2016 FINDINGS: Brain: Examination moderately degraded by motion artifact. Mildly advanced age-related cerebral atrophy. Extensive confluent T2/FLAIR hyperintensity throughout the periventricular and deep white matter both cerebral hemispheres, with involvement of the pons, most likely related chronic microvascular ischemic disease. Few small superimposed remote lacunar infarcts within the right thalamus. Punctate 5 mm infarct involving the cortical gray matter of the right precentral gyrus (series 5, image 64). No associated hemorrhage or mass effect. No other evidence for acute or subacute ischemia. Gray-white matter differentiation otherwise maintained. No other areas of remote cortical infarction. No foci of susceptibility artifact to suggest acute or chronic intracranial hemorrhage. No mass lesion, midline shift or  mass effect. No hydrocephalus. No extra-axial fluid collection. Pituitary gland suprasellar region normal. Midline structures intact and normal. Vascular: Major intracranial vascular flow voids maintained at the skull base. Skull and upper cervical spine: Craniocervical junction normal. Upper cervical spine within normal limits. Bone marrow signal intensity normal. No scalp soft tissue abnormality. Sinuses/Orbits: Globes orbital soft tissues within normal limits. Paranasal sinuses are clear. Left-sided mastoid effusion with partial opacification of the left middle ear cavity, similar to previous, and of doubtful significance in the acute setting. Trace fluid noted within the right mastoid air cells as well. Other: None. IMPRESSION: 1. 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 2. Underlying advanced cerebral white matter changes, most likely related to chronic microvascular ischemic disease, similar to previous. Electronically Signed   By: Jeannine Boga M.D.   On: 06/17/2018 01:38   Vas [place] Burnard Bunting With/wo Tbi  Result Date: 06/11/2018 LOWER EXTREMITY DOPPLER STUDY Indications: In 05/2017, an arterial Doppler showed an ABI of 1.20 on the right              and 1.17 on the left. Patient c/o intermittent worsening bilateral              lower extremity pain and weakness when lying down and standing.              Difficulty with walking, feels like both legs are going to "give              out". High Risk Factors: Hypertension, hyperlipidemia, Diabetes, past history of                    smoking, prior TIA. Other Factors: COPD.  Vascular Interventions: Atherectomy, PTA, and stenting of bilateral common iliac                         arteries in 07/2013. Performing Technologist: Sharlett Iles RVT  Examination Guidelines: A complete evaluation includes at minimum, Doppler waveform signals and systolic blood pressure reading at the level of bilateral brachial,  anterior tibial, and posterior tibial arteries, when vessel segments are accessible. Bilateral testing is considered an integral part of a complete examination. Photoelectric Plethysmograph (PPG) waveforms and toe systolic pressure readings are included as required and additional duplex testing as needed. Limited examinations for reoccurring indications may be performed as noted.  ABI Findings: +---------+------------------+-----+---------+--------+ Right    Rt Pressure (mmHg)IndexWaveform  Comment  +---------+------------------+-----+---------+--------+ Brachial 132                                      +---------+------------------+-----+---------+--------+ ATA      141               1.07 triphasic         +---------+------------------+-----+---------+--------+ PTA      153               1.16 triphasic         +---------+------------------+-----+---------+--------+ PERO     140               1.06 triphasic         +---------+------------------+-----+---------+--------+ Great Toe95                0.72 Normal            +---------+------------------+-----+---------+--------+ +---------+------------------+-----+---------+-------+ Left     Lt Pressure (mmHg)IndexWaveform Comment +---------+------------------+-----+---------+-------+ Brachial 129                                     +---------+------------------+-----+---------+-------+ ATA      144               1.09 triphasic        +---------+------------------+-----+---------+-------+ PTA      146               1.11 triphasic        +---------+------------------+-----+---------+-------+ PERO     143               1.08 triphasic        +---------+------------------+-----+---------+-------+ Great Toe95                0.72 Normal           +---------+------------------+-----+---------+-------+ +-------+-----------+-----------+------------+------------+ ABI/TBIToday's ABIToday's TBIPrevious  ABIPrevious TBI +-------+-----------+-----------+------------+------------+ Right  1.16       .72        1.20        .77          +-------+-----------+-----------+------------+------------+ Left   1.11       .72        1.17        .82          +-------+-----------+-----------+------------+------------+ Right and Left ABIs and TBIs appear essentially unchanged compared to prior study on 06/11/2017.  Summary: Right: Resting right ankle-brachial index is within normal range. No evidence of significant right lower extremity arterial disease. The right toe-brachial index is normal. Left: Resting left ankle-brachial index is within normal range. No evidence of significant left lower extremity arterial disease. The left toe-brachial index is normal.  *See table(s) above for measurements and observations.  Suggest follow up study in 12 months. See Aortoiliac Duplex report. Electronically signed by Ena Dawley MD on 06/11/2018 at 10:45:03 AM.    Final    US Thyroid  Result Date: 06/17/2018 CLINICAL DATA:  Nodule. Hyperthyroidism. Previous FNA biopsy of isthmic nodule 12/01/2015. EXAM: THYROID ULTRASOUND TECHNIQUE: Ultrasound examination of the thyroid gland and adjacent soft tissues was performed. COMPARISON:  11/24/2015 FINDINGS: Parenchymal Echotexture: Moderately heterogenous Isthmus: 0.9 cm thickness, previously 0.6 Right lobe: 6.4 x 3.3 x 2.7 cm, previously 6.7 x 2.7 x 2.5 Left lobe: 4.8 x 2.6 x 3 cm, previously 4.9 x 2.2 x  2.4 _________________________________________________________ Estimated total number of nodules >/= 1 cm: 6-10 Number of spongiform nodules >/=  2 cm not described below (TR1): 0 Number of mixed cystic and solid nodules >/= 1.5 cm not described below (Ponce): 0 _________________________________________________________ Nodule # 1: 3.1 x 1.9 x 2.7 cm left isthmic nodule, previously 2.9 x 1.5 x 2.7; this was previously biopsied 0.9 cm benign colloid cyst, mid isthmus 1.3 cm benign  colloid cyst, posterior mid right, previously 1.2 1.2 cm spongiform nodule, superior right, previously 1.3; This nodule does NOT meet TI-RADS criteria for biopsy or dedicated follow-up. Nodule # 5: Prior biopsy: No Location: Right; Inferior Maximum size: 1.1 cm; Other 2 dimensions: 0.8 x 0.9 cm, previously, 1.4 x 1.1 x 1.1 cm Composition: solid/almost completely solid (2) Echogenicity: isoechoic (1) Shape: not taller-than-wide (0) Margins: ill-defined (0) Echogenic foci: none (0) ACR TI-RADS total points: 3. ACR TI-RADS risk category:  TR3 (3 points). Significant change in size (>/= 20% in two dimensions and minimal increase of 2 mm): No Change in features: No Change in ACR TI-RADS risk category: No ACR TI-RADS recommendations: Given size (<1.4 cm) and appearance, this nodule does NOT meet TI-RADS criteria for biopsy or dedicated follow-up. _________________________________________________________ 0.9 cm complex cyst, superior right, previously 1 cm; This nodule does NOT meet TI-RADS criteria for biopsy or dedicated follow-up. 1.5 cm benign colloid cyst, superior left, previously 1.3 Nodule # 8: Prior biopsy: No Location: Left; Mid Maximum size: 1.4 cm; Other 2 dimensions: 1 x 1.1 cm, previously, 1.6 x 1 x 1.1 cm Composition: mixed cystic and solid (1) Echogenicity: hypoechoic (2) Shape: not taller-than-wide (0) Margins: smooth (0) Echogenic foci: none (0) ACR TI-RADS total points: 3. ACR TI-RADS risk category:  TR3 (3 points). Significant change in size (>/= 20% in two dimensions and minimal increase of 2 mm): No Change in features: No Change in ACR TI-RADS risk category: No ACR TI-RADS recommendations: Given size (<1.4 cm) and appearance, this nodule does NOT meet TI-RADS criteria for biopsy or dedicated follow-up. _________________________________________________________ Nodule # 9: Prior biopsy: No Location: Left; Inferior Maximum size: 1.5 cm; Other 2 dimensions: 0.8 x 1.1 cm, previously, 1.4 x 1.1 x 1.4 cm  Composition: solid/almost completely solid (2) Echogenicity: hypoechoic (2) Shape: not taller-than-wide (0) Margins: smooth (0) Echogenic foci: large comet-tail artifacts (0) ACR TI-RADS total points: 4. ACR TI-RADS risk category:  TR4 (4-6 points). Significant change in size (>/= 20% in two dimensions and minimal increase of 2 mm): No Change in features: No Change in ACR TI-RADS risk category: No ACR TI-RADS recommendations: **Given size (>/= 1.5 cm) and appearance, fine needle aspiration of this moderately suspicious nodule should be considered based on TI-RADS criteria. _________________________________________________________ Nodule # 10: Prior biopsy: No Location: Left; Inferior posterior Maximum size: 1.4 cm; Other 2 dimensions: 0.9 x 1.3 cm, previously, 1.5 x 0.8 x 1.1 cm Composition: solid/almost completely solid (2) Echogenicity: hypoechoic (2) Shape: not taller-than-wide (0) Margins: smooth (0) Echogenic foci: none (0) ACR TI-RADS total points: 4. ACR TI-RADS risk category:  TR4 (4-6 points). Significant change in size (>/= 20% in two dimensions and minimal increase of 2 mm): No Change in features: No Change in ACR TI-RADS risk category: No ACR TI-RADS recommendations: *Given size (>/= 1 - 1.4 cm) and appearance, a follow-up ultrasound in 1 year should be considered based on TI-RADS criteria. IMPRESSION: 1. Thyromegaly with multiple nodules. 2. Recommend FNA biopsy of moderately suspicious 1.5 cm inferior left nodule (#9). 3. Recommend annual/biennial ultrasound follow-up of additional  nodules as above, until stability x5 years confirmed. The above is in keeping with the ACR TI-RADS recommendations - J Am Coll Radiol 2017;14:587-595. Electronically Signed   By: Lucrezia Europe M.D.   On: 06/17/2018 17:03   Vas US Carotid  Result Date: 06/17/2018 Carotid Arterial Duplex Study Indications: CVA. Limitations: breathing interference Performing Technologist: June Leap RDMS, RVT  Examination Guidelines: A  complete evaluation includes B-mode imaging, spectral Doppler, color Doppler, and power Doppler as needed of all accessible portions of each vessel. Bilateral testing is considered an integral part of a complete examination. Limited examinations for reoccurring indications may be performed as noted.  Right Carotid Findings: +----------+--------+--------+--------+------------+--------+           PSV cm/sEDV cm/sStenosisDescribe    Comments +----------+--------+--------+--------+------------+--------+ CCA Prox  47      7                                    +----------+--------+--------+--------+------------+--------+ CCA Distal38      13              heterogenous         +----------+--------+--------+--------+------------+--------+ ICA Prox  29      10      1-39%   heterogenous         +----------+--------+--------+--------+------------+--------+ ICA Distal32      12                                   +----------+--------+--------+--------+------------+--------+ ECA       27      7                                    +----------+--------+--------+--------+------------+--------+ +----------+--------+-------+-----------------+-------------------+           PSV cm/sEDV cmsDescribe         Arm Pressure (mmHG) +----------+--------+-------+-----------------+-------------------+ [id number]             atypical waveform                    +----------+--------+-------+-----------------+-------------------+ +---------+--------+--+--------+----------+ VertebralPSV cm/s16EDV cm/sRetrograde +---------+--------+--+--------+----------+ dampened monophasic flow throughout carotid  Left Carotid Findings: +----------+--------+--------+--------+-------------------------+--------+           PSV cm/sEDV cm/sStenosisDescribe                 Comments +----------+--------+--------+--------+-------------------------+--------+ CCA Prox  62      13                                                 +----------+--------+--------+--------+-------------------------+--------+ CCA Distal81      14              heterogenous and calcific         +----------+--------+--------+--------+-------------------------+--------+ ICA Prox  123     20              heterogenous                      +----------+--------+--------+--------+-------------------------+--------+ ICA Distal97      24                                                +----------+--------+--------+--------+-------------------------+--------+  ECA       56      4                                                 +----------+--------+--------+--------+-------------------------+--------+ +----------+--------+--------+-----------------+-------------------+ SubclavianPSV cm/sEDV cm/sDescribe         Arm Pressure (mmHG) +----------+--------+--------+-----------------+-------------------+           175             atypical waveform                    +----------+--------+--------+-----------------+-------------------+ +---------+--------+--+--------+--+---------+ VertebralPSV cm/s75EDV cm/s13Antegrade +---------+--------+--+--------+--+---------+ monophasic flow throughout carotid  Summary: Right Carotid: Velocities in the right ICA are consistent with a 1-39% stenosis. Left Carotid: Velocities in the left ICA are consistent with a 1-39% stenosis. Vertebrals:  Bilateral vertebral arteries demonstrate antegrade flow. Subclavians: Normal flow hemodynamics were seen in bilateral subclavian              arteries. *See table(s) above for measurements and observations.  Electronically signed by Antony Contras MD on 06/17/2018 at 3:20:50 PM.    Final    Vas [place] Lower Extremity Venous (dvt)  Result Date: 06/18/2018  Lower Venous Study Indications: Stroke.  Performing Technologist: Maudry Mayhew MHA, RDMS, RVT, RDCS  Examination Guidelines: A complete evaluation includes B-mode imaging, spectral Doppler,  color Doppler, and power Doppler as needed of all accessible portions of each vessel. Bilateral testing is considered an integral part of a complete examination. Limited examinations for reoccurring indications may be performed as noted.  Right Venous Findings: +---------+---------------+---------+-----------+----------+--------------+          CompressibilityPhasicitySpontaneityPropertiesSummary        +---------+---------------+---------+-----------+----------+--------------+ CFV      Full           No       Yes                  pulsatile flow +---------+---------------+---------+-----------+----------+--------------+ SFJ      Full                                                        +---------+---------------+---------+-----------+----------+--------------+ FV Prox  Full                                                        +---------+---------------+---------+-----------+----------+--------------+ FV Mid   Full                                                        +---------+---------------+---------+-----------+----------+--------------+ FV DistalFull                                                        +---------+---------------+---------+-----------+----------+--------------+  PFV      Full                                                        +---------+---------------+---------+-----------+----------+--------------+ POP      Full           No       Yes                  pulsatile flow +---------+---------------+---------+-----------+----------+--------------+ PTV      Full                                                        +---------+---------------+---------+-----------+----------+--------------+ PERO     Full                                                        +---------+---------------+---------+-----------+----------+--------------+  Left Venous Findings:  +---------+---------------+---------+-----------+----------+--------------+          CompressibilityPhasicitySpontaneityPropertiesSummary        +---------+---------------+---------+-----------+----------+--------------+ CFV      Full           No       Yes                  pulsatile flow +---------+---------------+---------+-----------+----------+--------------+ SFJ      Full                                                        +---------+---------------+---------+-----------+----------+--------------+ FV Prox  Full                                                        +---------+---------------+---------+-----------+----------+--------------+ FV Mid   Full                                                        +---------+---------------+---------+-----------+----------+--------------+ FV DistalFull                                                        +---------+---------------+---------+-----------+----------+--------------+ PFV      Full                                                        +---------+---------------+---------+-----------+----------+--------------+  POP      Full           No       Yes                  pulsatile flow +---------+---------------+---------+-----------+----------+--------------+ PTV      Full                                                        +---------+---------------+---------+-----------+----------+--------------+ PERO     Full                                                        +---------+---------------+---------+-----------+----------+--------------+    Summary: Right: There is no evidence of deep vein thrombosis in the lower extremity. No cystic structure found in the popliteal fossa. Left: There is no evidence of deep vein thrombosis in the lower extremity. No cystic structure found in the popliteal fossa.  Pulsatile venous flow is suggestive of elevated right sided heart pressure. *See table(s)  above for measurements and observations. Electronically signed by Curt Jews MD on 06/18/2018 at 2:36:14 PM.    Final    Vas US Aorta/ivc/iliacs  Result Date: 06/11/2018 ABDOMINAL AORTA STUDY Indications: In 05/2017, an aortoiliac duplex showed stable >50% restenosis in              the right common iliac artery stent and patent left common iliac              artery stent without restenosis. Patient c/o intermittent worsening              bilateral lower extremity pain and weakness when lying down and              standing. Difficulty with walking, feels like both legs are going              to "give out". Risk Factors: Hypertension, hyperlipidemia, Diabetes, past history of smoking,               prior TIA. Vascular Interventions: Atherectomy, PTA, and stenting of bilateral common iliac                         arteries in 07/2013. Limitations: Air/bowel gas and obesity.   Performing Technologist: Sharlett Iles RVT  Examination Guidelines: A complete evaluation includes B-mode imaging, spectral Doppler, color Doppler, and power Doppler as needed of all accessible portions of each vessel. Bilateral testing is considered an integral part of a complete examination. Limited examinations for reoccurring indications may be performed as noted.  Abdominal Aorta Findings: +-----------+-------+----------+----------+---------+--------+--------+ Location   AP (cm)Trans (cm)PSV (cm/s)Waveform ThrombusComments +-----------+-------+----------+----------+---------+--------+--------+ Proximal                    54                                  +-----------+-------+----------+----------+---------+--------+--------+ Distal     NV                                                   +-----------+-------+----------+----------+---------+--------+--------+  RT EIA Prox                 118       triphasic                 +-----------+-------+----------+----------+---------+--------+--------+ LT EIA Prox                  84        biphasic                  +-----------+-------+----------+----------+---------+--------+--------+ Aortoiliac atherosclerosis. No evidence of stenosis in the bilateral external iliac arteries.  IVC/Iliac Findings: +--------+------+--------+--------+   IVC   PatentThrombusComments +--------+------+--------+--------+ IVC Proxpatent                 +--------+------+--------+--------+  Right Stent(s): +-------------------+--------+--------------+---------+--------+ Common Iliac ArteryPSV cm/sStenosis      Waveform Comments +-------------------+--------+--------------+---------+--------+ Prox to Stent      109                   triphasic         +-------------------+--------+--------------+---------+--------+ Proximal Stent     178     1-49% stenosisbiphasic          +-------------------+--------+--------------+---------+--------+ Mid Stent          160                   biphasic          +-------------------+--------+--------------+---------+--------+ Distal Stent       108                   biphasic          +-------------------+--------+--------------+---------+--------+ Distal to Stent    111                   biphasic          +-------------------+--------+--------------+---------+--------+ Patent right common iliac artery stent with mildly elevated velocities in the proximal and mid stent segment. These velocities appear decreased when compared to the previous exam.  Left Stent(s): +-------------------+--------+--------+--------+--------+ Common Iliac ArteryPSV cm/sStenosisWaveformComments +-------------------+--------+--------+--------+--------+ Prox to Stent      170             biphasic         +-------------------+--------+--------+--------+--------+ Proximal Stent     103             biphasic         +-------------------+--------+--------+--------+--------+ Mid Stent          87              biphasic          +-------------------+--------+--------+--------+--------+ Distal Stent       74              biphasic         +-------------------+--------+--------+--------+--------+ Distal to Stent    115             biphasic         +-------------------+--------+--------+--------+--------+ Patent left common iliac artery stent without focal stenosis. Poor image quality due to body habitus, abdominal rigidity, overlying bowel gas and rapid respiration.  Summary: Stenosis: +------------------+---------------------------------------------+ Location          Stent                                         +------------------+---------------------------------------------+ Right Common IliacNow <  50% stenosis, a decrease from prior exam +------------------+---------------------------------------------+ Patent left common iliac artery stent without focal stenosis. Patent IVC. *See table(s) above for measurements and observations. Suggest follow up study in 12 months.  See ABI report. Electronically signed by Ena Dawley MD on 06/11/2018 at 11:31:06 AM.    Final     Micro Results     No results found for this or any previous visit (from the past 240 hour(s)).  Today   Subjective    Caitlin Oliver today has no headache,no chest abdominal pain,no new weakness tingling or numbness, feels much better wants to go home today.     Objective   Blood pressure (!) 124/93, pulse 95, temperature 97.7 F (36.5 C), temperature source Oral, resp. rate 20, weight 83.2 kg, SpO2 (!) 89 %.   Intake/Output Summary (Last 24 hours) at 06/20/2018 0929 Last data filed at 06/19/2018 2307 Gross per 24 hour  Intake 0 ml  Output 550 ml  Net -550 ml    Exam Awake Alert, Oriented x 3, No new F.N deficits, Normal affect [place].AT,PERRAL Supple Neck,No JVD, No cervical lymphadenopathy appriciated.  Symmetrical Chest wall movement, Good air movement bilaterally, CTAB RRR,No Gallops,Rubs or new Murmurs, No Parasternal Heave +ve  B.Sounds, Abd Soft, Non tender, No organomegaly appriciated, No rebound -guarding or rigidity. No Cyanosis, Clubbing or edema, No new Rash or bruise   Data Review   CBC w Diff:  Lab Results  Component Value Date   WBC 7.3 06/17/2018   HGB 13.2 06/17/2018   HGB 15.1 (H) 03/10/2018   HGB 16.0 (H) 06/03/2017   HGB 16.0 (H) 03/12/2017   HCT 43.2 06/17/2018   HCT 47.5 (H) 06/03/2017   HCT 48.0 (H) 03/12/2017   PLT 245 06/17/2018   PLT 275 03/10/2018   PLT 269 06/03/2017   LYMPHOPCT 14 06/17/2018   LYMPHOPCT 19.7 03/12/2017   MONOPCT 8 06/17/2018   MONOPCT 7.3 03/12/2017   EOSPCT 1 06/17/2018   EOSPCT 1.6 03/12/2017   BASOPCT 1 06/17/2018   BASOPCT 0.9 03/12/2017    CMP:  Lab Results  Component Value Date   NA 134 (L) 06/18/2018   NA 138 06/03/2017   NA 138 03/12/2017   K 4.5 06/18/2018   K 4.6 03/12/2017   CL 97 (L) 06/18/2018   CO2 28 06/18/2018   CO2 27 03/12/2017   BUN 21 06/18/2018   BUN 20 06/03/2017   BUN 19.6 03/12/2017   CREATININE 0.98 06/18/2018   CREATININE 0.78 03/10/2018   CREATININE 0.9 03/12/2017   PROT 6.4 (L) 06/17/2018   PROT 6.9 06/03/2017   PROT 7.7 03/12/2017   ALBUMIN 3.5 06/17/2018   ALBUMIN 4.7 06/03/2017   ALBUMIN 4.2 03/12/2017   BILITOT 0.9 06/17/2018   BILITOT 0.4 03/10/2018   BILITOT 0.44 03/12/2017   ALKPHOS 67 06/17/2018   ALKPHOS 91 03/12/2017   AST 48 (H) 06/17/2018   AST 24 03/10/2018   AST 38 (H) 03/12/2017   ALT 63 (H) 06/17/2018   ALT 29 03/10/2018   ALT 38 03/12/2017  . Lab Results  Component Value Date   HGBA1C 7.9 (H) 06/17/2018   Lab Results  Component Value Date   CHOL 99 06/17/2018   HDL 31 (L) 06/17/2018   LDLCALC 44 06/17/2018   TRIG 122 06/17/2018   CHOLHDL 3.2 06/17/2018     Total Time in preparing paper work, data evaluation and todays exam - 24 minutes  Lala Lund M.D on 06/20/2018 at 9:29 AM  [facility]   Office  [phone]

## 2018-06-17 NOTE — Progress Notes (Addendum)
PROGRESS NOTE                                                                                                                                                                                                             Patient Demographics:    Caitlin Oliver, is a 78 y.o. female, DOB - [birth year], YZJ:[id number]  Admit date - 06/16/2018   Admitting Physician Rise Patience, MD  Outpatient Primary MD for the patient is [name], PA-C  LOS - 0  Chief Complaint  Patient presents with  . Altered Mental Status       Brief Narrative  Caitlin Oliver is a 78 y.o. female with history of hypertension, hyperlipidemia, peripheral vascular disease status post bilateral carotid endarterectomy and lower extremity stent placement, depression, diabetes mellitus type 2 was brought to the ER after patient had a brief episode of syncope while walking to go to the PCPs office.  Over the last 3 days patient has been finding increasingly difficult to walk with ataxia and staggering towards the left side. In the ER she was diagnosed with CVA.   Subjective:    Caitlin Oliver today has, No headache, No chest pain, No abdominal pain - No Nausea, No new weakness tingling or numbness, No Cough - SOB.    Assessment  & Plan :     1.  Right precentral gyrus 5 mm acute ischemic stroke.  Symptoms have resolved, A1c elevated, LDL at goal, seen by neuro, case discussed with Dr. Leonie Man, echo stable, currently on dual antiplatelet therapy along with statin which will be continued.  PT OT and speech evaluation.  Patient overall quite weak and deconditioned per PT SNF.  Patient not safe for home discharge per PT.  2. Dyslipidemia.  LDL uncontrolled continue home dose statin.  3.  Severe pulmonary hypertension.  Diagnosed on echocardiogram this admission.  Oxygen treatment.  Outpatient pulmonary follow-up, may require sleep study to rule out underlying sleep apnea.  Will check d-dimer and lower extremity  venous duplex as well.  4.  Diastolic CHF with EF 84%.  Currently compensated.  5.  Thyroid nodule noted on CTA.  Thyroid ultrasound ordered along with TSH, free T4 and T3.  6.  Hypertension.  On Cardizem and metoprolol.  Continue PRN hydralazine.  7. PAD and history of carotid artery disease.  Has had carotid surgery in the past with Dr. Trula Slade, currently on dual antiplatelet therapy and statin, CT angiogram does show significant disease, discussed with  neurology carotid ultrasound has been ordered will monitor.  Will require outpatient vascular surgery follow-up.  8.  HX of lung cancer status post lobectomy.  No acute issues.  9.  Syncope due to dehydration deconditioning.  IV fluids, monitor on telemetry, echo noted, PT, SNF.  10.  ARF due to dehydration.  Resolved after IV fluids.  11.  DM type II.  Poor control due to hyperglycemia.  DM and insulin education, Lantus and sliding scale here.  Hold Glucophage.  Lab Results  Component Value Date   HGBA1C 7.9 (H) 06/17/2018   CBG (last 3)  Recent Labs    06/16/18 1937 06/17/18 0820  GLUCAP 106* 101*     Family Communication  :  None  Code Status :  Full  Disposition Plan  :  SNF  Consults  :  Neuro  Procedures  :    CTA  - 1. Negative CTA for large vessel occlusion. 2. Severe atheromatous disease about the aortic arch and origin of the great vessels. Resultant stenoses of up to 65% at the right brachiocephalic artery, 44% at the proximal left common carotid artery, 60-70% at the origin of the right common carotid artery and 75% at the proximal right subclavian artery. Vascular stent in place at the origin of the left subclavian artery with widely patent flow. 3. Sequelae of prior bilateral CEAs without significant residual stenosis. Atheromatous stenosis of up to 70% at the distal right ICA. No other significant stenosis about the left ICA. 4. Relative sparing of the posterior circulation with wide patency of the  vertebrobasilar system. Attenuated flow within the right vertebral artery likely related to the above-mentioned proximal right subclavian artery stenosis. 5. 3.2 cm left thyroid if not already performed. Dedicated nonemergent thyroid ultrasound recommended 6. Emphysema with postoperative changes within the partially visualized left lung  MRI - 1. 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 2. Underlying advanced cerebral white matter changes, most likely related to chronic microvascular ischemic disease, similar to previous.  TTE -    1. The left ventricle has normal systolic function with an ejection fraction of 60-65%. The cavity size was normal. Moderate septal hypertrophy. Left ventricular diastolic Doppler parameters are consistent with impaired relaxation.  2. The right ventricle has mildly reduced systolic function. The cavity was mildly enlarged. There is no increase in right ventricular wall thickness. Right ventricular systolic pressure is severely elevated with an estimated pressure of 69.9 mmHg.  3. Left atrial size was moderately dilated.  4. The mitral valve is normal in structure.  5. The tricuspid valve is normal in structure.  6. The aortic valve is normal in structure.  7. The pulmonic valve was normal in structure.  8. There is mild dilatation of the ascending aorta.   Carotid US  Thyroid US   DVT Prophylaxis  :  Lovenox    Lab Results  Component Value Date   PLT 245 06/17/2018    Diet :  Diet Order            Diet heart healthy/carb modified Room service appropriate? Yes; Fluid consistency: Thin  Diet effective now               Inpatient Medications Scheduled Meds: . aspirin  325 mg Oral Daily  . [START ON 06/18/2018] clopidogrel  75 mg Oral Daily  . diltiazem  180 mg Oral Daily  . DULoxetine  60 mg Oral QHS  . enoxaparin (LOVENOX) injection  40 mg Subcutaneous Daily  . insulin aspart  0-9 Units  Subcutaneous TID WC  . iopamidol      . metoprolol tartrate  37.5 mg Oral BID WC  . omega-3 acid ethyl esters  1 g Oral Daily  . pantoprazole  40 mg Oral BID  . perflutren lipid microspheres (DEFINITY) IV suspension      . pravastatin  40 mg Oral QAC supper   Continuous Infusions: PRN Meds:.acetaminophen **OR** [DISCONTINUED] acetaminophen, albuterol, cyclobenzaprine, HYDROcodone-acetaminophen, ondansetron **OR** ondansetron (ZOFRAN) IV  Antibiotics  :   Anti-infectives (From admission, onward)   None          Objective:   Vitals:   06/17/18 0420 06/17/18 0618 06/17/18 0619 06/17/18 0820  BP: (!) 161/109 (!) 209/116 (!) 162/113 (!) 158/102  Pulse: 94 94 95 (!) 103  Resp: _0 Temp: 97.6 F (36.4 C) 98.3 F (36.8 C)  97.8 F (36.6 C)  TempSrc: Oral   Oral  SpO2: 96% 97% 100% 95%  Weight:        Wt Readings from Last 3 Encounters:  06/17/18 87.3 kg  03/10/18 84 kg  09/23/17 85.5 kg     Intake/Output Summary (Last 24 hours) at 06/17/2018 1131 Last data filed at 06/16/2018 2148 Gross per 24 hour  Intake 1000 ml  Output -  Net 1000 ml     Physical Exam  Awake Alert, Oriented X 3, No new F.N deficits, Normal affect [place].AT,PERRAL Supple Neck,No JVD, No cervical lymphadenopathy appriciated.  Symmetrical Chest wall movement, Good air movement bilaterally, CTAB RRR,No Gallops,Rubs or new Murmurs, No Parasternal Heave +ve B.Sounds, Abd Soft, No tenderness, No organomegaly appriciated, No rebound - guarding or rigidity. No Cyanosis, Clubbing or edema, No new Rash or bruise       Data Review:    CBC Recent Labs  Lab 06/16/18 1743 06/16/18 2314 06/17/18 0430  WBC 9.9 9.4 7.3  HGB 13.3 13.7 13.2  HCT 43.4 43.9 43.2  PLT 300 124* 245  MCV 91.4 92.4 90.6  MCH 28.0 28.8 27.7  MCHC 30.6 31.2 30.6  RDW 13.6 13.8 13.7  LYMPHSABS 1.4  --  1.0  MONOABS 0.7  --  0.6  EOSABS 0.1  --  0.1  BASOSABS 0.1  --  0.0    Chemistries  Recent Labs  Lab  06/16/18 1743 06/16/18 1759 06/16/18 2314 06/17/18 0430  NA 132*  --   --  133*  K 5.1  --   --  4.7  CL 96*  --   --  94*  CO2 23  --   --  26  GLUCOSE 131*  --   --  117*  BUN 30*  --   --  24*  CREATININE 1.32* 1.00 1.01* 0.88  CALCIUM 9.4  --   --  9.0  AST 51*  --   --  48*  ALT 64*  --   --  63*  ALKPHOS 70  --   --  67  BILITOT 0.7  --   --  0.9   ------------------------------------------------------------------------------------------------------------------ Recent Labs    06/17/18 0430  CHOL 99  HDL 31*  LDLCALC 44  TRIG 122  CHOLHDL 3.2    Lab Results  Component Value Date   HGBA1C 7.9 (H) 06/17/2018   ------------------------------------------------------------------------------------------------------------------ Recent Labs    06/16/18 2314  TSH 2.653   ------------------------------------------------------------------------------------------------------------------ No results for input(s): VITAMINB12, FOLATE, FERRITIN, TIBC, IRON, RETICCTPCT in the last 72 hours.  Coagulation profile Recent Labs  Lab 06/16/18 1743  INR 1.1    Recent Labs    06/16/18 2314  DDIMER 0.85*    Cardiac Enzymes Recent Labs  Lab 06/16/18 2314 06/17/18 0430  TROPONINI <0.03 <0.03   ------------------------------------------------------------------------------------------------------------------ No results found for: BNP  Micro Results No results found for this or any previous visit (from the past 240 hour(s)).  Radiology Reports Ct Angio Head W Or Wo Contrast  Result Date: 06/17/2018 CLINICAL DATA:  Follow-up examination for acute stroke. EXAM: CT ANGIOGRAPHY HEAD AND NECK TECHNIQUE: Multidetector CT imaging of the head and neck was performed using the standard protocol during bolus administration of intravenous contrast. Multiplanar CT image reconstructions and MIPs were obtained to evaluate the vascular anatomy. Carotid stenosis measurements (when  applicable) are obtained utilizing NASCET criteria, using the distal internal carotid diameter as the denominator. CONTRAST:  53m ISOVUE-370 IOPAMIDOL (ISOVUE-370) INJECTION 76% COMPARISON:  Prior MRI from earlier same day. FINDINGS: CTA NECK FINDINGS Aortic arch: Visualized aortic arch of normal caliber with normal branch pattern. Heavy atheromatous plaque about the visualized arch and origin of the great vessels. Associated stenosis of up to 60% at the right brachiocephalic artery. Narrowing short-segment severe approximate 80% stenosis at the proximal left common carotid artery (series 8, image 297). Heavy atheromatous plaque at the origin of the left subclavian artery with left subclavian stent in place. Widely patent flow through the stent without significant intra stent stenosis. Additional approximate 50% stenosis at the mid left subclavian artery (series 8, image 267). Focal stenosis of up to approximate 75% at the origin of the right subclavian artery. Right carotid system: Atheromatous plaque at the origin of the right common carotid artery with associated stenosis of up to approximate 60-70% by NASCET criteria. Additional scattered plaque within the right CCA distally with scattered mild multifocal narrowing. Prior right carotid endarterectomy without significant residual stenosis. Right ICA widely patent at its proximal and mid aspect. Atheromatous plaque at the distal right ICA with associated stenosis of up to 70% by NASCET criteria (series 8, image 146). No acute vascular abnormality within the right carotid artery system. Left carotid system: Focal 80% stenosis at the proximal left common carotid artery again noted. Additional scattered plaque within the mid and distal left CCA with up to approximate 50% stenosis by NASCET criteria. Prior left carotid endarterectomy with residual mild 20% narrowing of the proximal left ICA. Left ICA otherwise widely patent distally without stenosis or acute vascular  abnormality. Short-segment fenestration versus chronic dissection flap noted at the proximal left external carotid artery (series 8, image 194). Vertebral arteries: Both vertebral arteries arise from the subclavian arteries. Left vertebral artery dominant and widely patent within the neck. Markedly attenuated flow within the right vertebral artery, most likely related to the previously identified high-grade proximal right subclavian artery stenosis. Right vertebral artery otherwise grossly patent without appreciable stenosis. Skeleton: No acute osseous abnormality. No discrete lytic or blastic osseous lesions. Degenerative spondylolysis noted at C5-6. Other neck: No acute soft tissue abnormality within the neck. No adenopathy. Heterogeneous left thyroid nodule measures up to approximately 3.2 cm. Upper chest: Partially visualized upper chest demonstrates no acute finding. Centrilobular and paraseptal emphysematous changes with superimposed atelectasis noted. Pleuroparenchymal thickening about the partially visualized left lung suspected to at least in part be postoperative related to prior lobectomy. Review of the MIP images confirms the above findings CTA HEAD FINDINGS Anterior circulation: Petrous segments widely patent. Scattered atheromatous plaque within the cavernous/supraclinoid ICAs with resultant mild  to moderate diffuse narrowing bilaterally (no more than 50%). ICA termini well perfused. A1 segments widely patent. Normal anterior communicating artery. Anterior cerebral arteries widely patent to their distal aspects. No M1 stenosis or occlusion. Distal MCA branches well perfused and symmetric. Posterior circulation: Vertebral arteries widely patent to the vertebrobasilar junction without stenosis. Adequate opacification of the right V4 segment likely at least partially retrograde in nature across the vertebrobasilar junction. Posterior inferior cerebral arteries patent bilaterally. Basilar widely patent  proximally, with mild narrowing at the basilar tip. Superior cerebral arteries patent bilaterally. Probable duplicated right SCA noted. Both of the posterior cerebral arteries primarily supplied via the basilar. PCAs widely patent to their distal aspects. Small bilateral posterior communicating arteries noted. Venous sinuses: Patent. Anatomic variants: None significant. Delayed phase: No abnormal enhancement. Review of the MIP images confirms the above findings IMPRESSION: 1. Negative CTA for large vessel occlusion. 2. Severe atheromatous disease about the aortic arch and origin of the great vessels. Resultant stenoses of up to 65% at the right brachiocephalic artery, 60% at the proximal left common carotid artery, 60-70% at the origin of the right common carotid artery and 75% at the proximal right subclavian artery. Vascular stent in place at the origin of the left subclavian artery with widely patent flow. 3. Sequelae of prior bilateral CEAs without significant residual stenosis. Atheromatous stenosis of up to 70% at the distal right ICA. No other significant stenosis about the left ICA. 4. Relative sparing of the posterior circulation with wide patency of the vertebrobasilar system. Attenuated flow within the right vertebral artery likely related to the above-mentioned proximal right subclavian artery stenosis. 5. 3.2 cm left thyroid if not already performed. Dedicated nonemergent thyroid ultrasound recommended 6. Emphysema with postoperative changes within the partially visualized left lung. Electronically Signed   By: Jeannine Boga M.D.   On: 06/17/2018 04:34   Ct Head Wo Contrast  Result Date: 06/16/2018 CLINICAL DATA:  Focal neurologic deficits with confusion and weakness EXAM: CT HEAD WITHOUT CONTRAST TECHNIQUE: Contiguous axial images were obtained from the base of the skull through the vertex without intravenous contrast. COMPARISON:  10/20/2015 FINDINGS: Brain: Diffuse moderate to marked small  vessel ischemic disease of periventricular and subcortical white matter without acute hemorrhage, intra-axial mass nor extra-axial fluid collections. No midline shift or edema. No large vascular territory infarct. Global atrophy is stable. Vascular: Atherosclerosis of the carotid siphons. No hyperdense vessel sign. Skull: Mild osseous demineralization skull base without acute fracture or bone destruction. Sinuses/Orbits: No acute finding. Other: None IMPRESSION: Atrophy with chronic, largely stable degree of moderate to marked small vessel ischemic disease of periventricular and subcortical white matter. No acute intracranial abnormality. Electronically Signed   By: Ashley Royalty M.D.   On: 06/16/2018 18:14   Ct Angio Neck W Or Wo Contrast  Result Date: 06/17/2018 CLINICAL DATA:  Follow-up examination for acute stroke. EXAM: CT ANGIOGRAPHY HEAD AND NECK TECHNIQUE: Multidetector CT imaging of the head and neck was performed using the standard protocol during bolus administration of intravenous contrast. Multiplanar CT image reconstructions and MIPs were obtained to evaluate the vascular anatomy. Carotid stenosis measurements (when applicable) are obtained utilizing NASCET criteria, using the distal internal carotid diameter as the denominator. CONTRAST:  59m ISOVUE-370 IOPAMIDOL (ISOVUE-370) INJECTION 76% COMPARISON:  Prior MRI from earlier same day. FINDINGS: CTA NECK FINDINGS Aortic arch: Visualized aortic arch of normal caliber with normal branch pattern. Heavy atheromatous plaque about the visualized arch and origin of the great vessels. Associated stenosis of  up to 60% at the right brachiocephalic artery. Narrowing short-segment severe approximate 80% stenosis at the proximal left common carotid artery (series 8, image 297). Heavy atheromatous plaque at the origin of the left subclavian artery with left subclavian stent in place. Widely patent flow through the stent without significant intra stent stenosis.  Additional approximate 50% stenosis at the mid left subclavian artery (series 8, image 267). Focal stenosis of up to approximate 75% at the origin of the right subclavian artery. Right carotid system: Atheromatous plaque at the origin of the right common carotid artery with associated stenosis of up to approximate 60-70% by NASCET criteria. Additional scattered plaque within the right CCA distally with scattered mild multifocal narrowing. Prior right carotid endarterectomy without significant residual stenosis. Right ICA widely patent at its proximal and mid aspect. Atheromatous plaque at the distal right ICA with associated stenosis of up to 70% by NASCET criteria (series 8, image 146). No acute vascular abnormality within the right carotid artery system. Left carotid system: Focal 80% stenosis at the proximal left common carotid artery again noted. Additional scattered plaque within the mid and distal left CCA with up to approximate 50% stenosis by NASCET criteria. Prior left carotid endarterectomy with residual mild 20% narrowing of the proximal left ICA. Left ICA otherwise widely patent distally without stenosis or acute vascular abnormality. Short-segment fenestration versus chronic dissection flap noted at the proximal left external carotid artery (series 8, image 194). Vertebral arteries: Both vertebral arteries arise from the subclavian arteries. Left vertebral artery dominant and widely patent within the neck. Markedly attenuated flow within the right vertebral artery, most likely related to the previously identified high-grade proximal right subclavian artery stenosis. Right vertebral artery otherwise grossly patent without appreciable stenosis. Skeleton: No acute osseous abnormality. No discrete lytic or blastic osseous lesions. Degenerative spondylolysis noted at C5-6. Other neck: No acute soft tissue abnormality within the neck. No adenopathy. Heterogeneous left thyroid nodule measures up to  approximately 3.2 cm. Upper chest: Partially visualized upper chest demonstrates no acute finding. Centrilobular and paraseptal emphysematous changes with superimposed atelectasis noted. Pleuroparenchymal thickening about the partially visualized left lung suspected to at least in part be postoperative related to prior lobectomy. Review of the MIP images confirms the above findings CTA HEAD FINDINGS Anterior circulation: Petrous segments widely patent. Scattered atheromatous plaque within the cavernous/supraclinoid ICAs with resultant mild to moderate diffuse narrowing bilaterally (no more than 50%). ICA termini well perfused. A1 segments widely patent. Normal anterior communicating artery. Anterior cerebral arteries widely patent to their distal aspects. No M1 stenosis or occlusion. Distal MCA branches well perfused and symmetric. Posterior circulation: Vertebral arteries widely patent to the vertebrobasilar junction without stenosis. Adequate opacification of the right V4 segment likely at least partially retrograde in nature across the vertebrobasilar junction. Posterior inferior cerebral arteries patent bilaterally. Basilar widely patent proximally, with mild narrowing at the basilar tip. Superior cerebral arteries patent bilaterally. Probable duplicated right SCA noted. Both of the posterior cerebral arteries primarily supplied via the basilar. PCAs widely patent to their distal aspects. Small bilateral posterior communicating arteries noted. Venous sinuses: Patent. Anatomic variants: None significant. Delayed phase: No abnormal enhancement. Review of the MIP images confirms the above findings IMPRESSION: 1. Negative CTA for large vessel occlusion. 2. Severe atheromatous disease about the aortic arch and origin of the great vessels. Resultant stenoses of up to 65% at the right brachiocephalic artery, 53% at the proximal left common carotid artery, 60-70% at the origin of the right common carotid artery  and 75%  at the proximal right subclavian artery. Vascular stent in place at the origin of the left subclavian artery with widely patent flow. 3. Sequelae of prior bilateral CEAs without significant residual stenosis. Atheromatous stenosis of up to 70% at the distal right ICA. No other significant stenosis about the left ICA. 4. Relative sparing of the posterior circulation with wide patency of the vertebrobasilar system. Attenuated flow within the right vertebral artery likely related to the above-mentioned proximal right subclavian artery stenosis. 5. 3.2 cm left thyroid if not already performed. Dedicated nonemergent thyroid ultrasound recommended 6. Emphysema with postoperative changes within the partially visualized left lung. Electronically Signed   By: Jeannine Boga M.D.   On: 06/17/2018 04:34   Mr Brain Wo Contrast  Result Date: 06/17/2018 CLINICAL DATA:  Initial evaluation for gradually progressive weakness for several days, ataxia. EXAM: MRI HEAD WITHOUT CONTRAST TECHNIQUE: Multiplanar, multiecho pulse sequences of the brain and surrounding structures were obtained without intravenous contrast. COMPARISON:  Prior CT from 06/16/2018 as well as previous MRI from 06/13/2016 FINDINGS: Brain: Examination moderately degraded by motion artifact. Mildly advanced age-related cerebral atrophy. Extensive confluent T2/FLAIR hyperintensity throughout the periventricular and deep white matter both cerebral hemispheres, with involvement of the pons, most likely related chronic microvascular ischemic disease. Few small superimposed remote lacunar infarcts within the right thalamus. Punctate 5 mm infarct involving the cortical gray matter of the right precentral gyrus (series 5, image 64). No associated hemorrhage or mass effect. No other evidence for acute or subacute ischemia. Gray-white matter differentiation otherwise maintained. No other areas of remote cortical infarction. No foci of susceptibility artifact to  suggest acute or chronic intracranial hemorrhage. No mass lesion, midline shift or mass effect. No hydrocephalus. No extra-axial fluid collection. Pituitary gland suprasellar region normal. Midline structures intact and normal. Vascular: Major intracranial vascular flow voids maintained at the skull base. Skull and upper cervical spine: Craniocervical junction normal. Upper cervical spine within normal limits. Bone marrow signal intensity normal. No scalp soft tissue abnormality. Sinuses/Orbits: Globes orbital soft tissues within normal limits. Paranasal sinuses are clear. Left-sided mastoid effusion with partial opacification of the left middle ear cavity, similar to previous, and of doubtful significance in the acute setting. Trace fluid noted within the right mastoid air cells as well. Other: None. IMPRESSION: 1. 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 2. Underlying advanced cerebral white matter changes, most likely related to chronic microvascular ischemic disease, similar to previous. Electronically Signed   By: Jeannine Boga M.D.   On: 06/17/2018 01:38   Vas [place] [facility] Bunting With/wo Tbi  Result Date: 06/11/2018 LOWER EXTREMITY DOPPLER STUDY Indications: In 05/2017, an arterial Doppler showed an ABI of 1.20 on the right              and 1.17 on the left. Patient c/o intermittent worsening bilateral              lower extremity pain and weakness when lying down and standing.              Difficulty with walking, feels like both legs are going to "give              out". High Risk Factors: Hypertension, hyperlipidemia, Diabetes, past history of                    smoking, prior TIA. Other Factors: COPD.  Vascular Interventions: Atherectomy, PTA, and stenting of bilateral common iliac  arteries in 07/2013. Performing Technologist: Sharlett Iles RVT  Examination Guidelines: A complete evaluation includes at minimum, Doppler  waveform signals and systolic blood pressure reading at the level of bilateral brachial, anterior tibial, and posterior tibial arteries, when vessel segments are accessible. Bilateral testing is considered an integral part of a complete examination. Photoelectric Plethysmograph (PPG) waveforms and toe systolic pressure readings are included as required and additional duplex testing as needed. Limited examinations for reoccurring indications may be performed as noted.  ABI Findings: +---------+------------------+-----+---------+--------+ Right    Rt Pressure (mmHg)IndexWaveform Comment  +---------+------------------+-----+---------+--------+ Brachial 132                                      +---------+------------------+-----+---------+--------+ ATA      141               1.07 triphasic         +---------+------------------+-----+---------+--------+ PTA      153               1.16 triphasic         +---------+------------------+-----+---------+--------+ PERO     140               1.06 triphasic         +---------+------------------+-----+---------+--------+ Great Toe95                0.72 Normal            +---------+------------------+-----+---------+--------+ +---------+------------------+-----+---------+-------+ Left     Lt Pressure (mmHg)IndexWaveform Comment +---------+------------------+-----+---------+-------+ Brachial 129                                     +---------+------------------+-----+---------+-------+ ATA      144               1.09 triphasic        +---------+------------------+-----+---------+-------+ PTA      146               1.11 triphasic        +---------+------------------+-----+---------+-------+ PERO     143               1.08 triphasic        +---------+------------------+-----+---------+-------+ Great Toe95                0.72 Normal           +---------+------------------+-----+---------+-------+  +-------+-----------+-----------+------------+------------+ ABI/TBIToday's ABIToday's TBIPrevious ABIPrevious TBI +-------+-----------+-----------+------------+------------+ Right  1.16       .72        1.20        .77          +-------+-----------+-----------+------------+------------+ Left   1.11       .72        1.17        .82          +-------+-----------+-----------+------------+------------+ Right and Left ABIs and TBIs appear essentially unchanged compared to prior study on 06/11/2017.  Summary: Right: Resting right ankle-brachial index is within normal range. No evidence of significant right lower extremity arterial disease. The right toe-brachial index is normal. Left: Resting left ankle-brachial index is within normal range. No evidence of significant left lower extremity arterial disease. The left toe-brachial index is normal.  *See table(s) above for measurements and observations.  Suggest follow up study in 12 months. See Aortoiliac Duplex  report. Electronically signed by Ena Dawley MD on 06/11/2018 at 10:45:03 AM.    Final    Vas US Aorta/ivc/iliacs  Result Date: 06/11/2018 ABDOMINAL AORTA STUDY Indications: In 05/2017, an aortoiliac duplex showed stable >50% restenosis in              the right common iliac artery stent and patent left common iliac              artery stent without restenosis. Patient c/o intermittent worsening              bilateral lower extremity pain and weakness when lying down and              standing. Difficulty with walking, feels like both legs are going              to "give out". Risk Factors: Hypertension, hyperlipidemia, Diabetes, past history of smoking,               prior TIA. Vascular Interventions: Atherectomy, PTA, and stenting of bilateral common iliac                         arteries in 07/2013. Limitations: Air/bowel gas and obesity.   Performing Technologist: Sharlett Iles RVT  Examination Guidelines: A complete evaluation includes  B-mode imaging, spectral Doppler, color Doppler, and power Doppler as needed of all accessible portions of each vessel. Bilateral testing is considered an integral part of a complete examination. Limited examinations for reoccurring indications may be performed as noted.  Abdominal Aorta Findings: +-----------+-------+----------+----------+---------+--------+--------+ Location   AP (cm)Trans (cm)PSV (cm/s)Waveform ThrombusComments +-----------+-------+----------+----------+---------+--------+--------+ Proximal                    54                                  +-----------+-------+----------+----------+---------+--------+--------+ Distal     NV                                                   +-----------+-------+----------+----------+---------+--------+--------+ RT EIA Prox                 118       triphasic                 +-----------+-------+----------+----------+---------+--------+--------+ LT EIA Prox                 84        biphasic                  +-----------+-------+----------+----------+---------+--------+--------+ Aortoiliac atherosclerosis. No evidence of stenosis in the bilateral external iliac arteries.  IVC/Iliac Findings: +--------+------+--------+--------+   IVC   PatentThrombusComments +--------+------+--------+--------+ IVC Proxpatent                 +--------+------+--------+--------+  Right Stent(s): +-------------------+--------+--------------+---------+--------+ Common Iliac ArteryPSV cm/sStenosis      Waveform Comments +-------------------+--------+--------------+---------+--------+ Prox to Stent      109                   triphasic         +-------------------+--------+--------------+---------+--------+ Proximal Stent     178     1-49% stenosisbiphasic          +-------------------+--------+--------------+---------+--------+  Mid Stent          160                   biphasic           +-------------------+--------+--------------+---------+--------+ Distal Stent       108                   biphasic          +-------------------+--------+--------------+---------+--------+ Distal to Stent    111                   biphasic          +-------------------+--------+--------------+---------+--------+ Patent right common iliac artery stent with mildly elevated velocities in the proximal and mid stent segment. These velocities appear decreased when compared to the previous exam.  Left Stent(s): +-------------------+--------+--------+--------+--------+ Common Iliac ArteryPSV cm/sStenosisWaveformComments +-------------------+--------+--------+--------+--------+ Prox to Stent      170             biphasic         +-------------------+--------+--------+--------+--------+ Proximal Stent     103             biphasic         +-------------------+--------+--------+--------+--------+ Mid Stent          87              biphasic         +-------------------+--------+--------+--------+--------+ Distal Stent       74              biphasic         +-------------------+--------+--------+--------+--------+ Distal to Stent    115             biphasic         +-------------------+--------+--------+--------+--------+ Patent left common iliac artery stent without focal stenosis. Poor image quality due to body habitus, abdominal rigidity, overlying bowel gas and rapid respiration.  Summary: Stenosis: +------------------+---------------------------------------------+ Location          Stent                                         +------------------+---------------------------------------------+ Right Common IliacNow <50% stenosis, a decrease from prior exam +------------------+---------------------------------------------+ Patent left common iliac artery stent without focal stenosis. Patent IVC. *See table(s) above for measurements and observations. Suggest follow up study  in 12 months.  See ABI report. Electronically signed by Ena Dawley MD on 06/11/2018 at 11:31:06 AM.    Final     Time Spent in minutes  30   Lala Lund M.D on 06/17/2018 at 11:31 AM  To page go to [email or web address] - password [email or web address]

## 2018-06-18 ENCOUNTER — Inpatient Hospital Stay (HOSPITAL_COMMUNITY): Payer: PPO

## 2018-06-18 DIAGNOSIS — I82409 Acute embolism and thrombosis of unspecified deep veins of unspecified lower extremity: Secondary | ICD-10-CM

## 2018-06-18 LAB — BASIC METABOLIC PANEL
Anion gap: 9 (ref 5–15)
BUN: 21 mg/dL (ref 8–23)
CO2: 28 mmol/L (ref 22–32)
Calcium: 8.4 mg/dL — ABNORMAL LOW (ref 8.9–10.3)
Chloride: 97 mmol/L — ABNORMAL LOW (ref 98–111)
Creatinine, Ser: 0.98 mg/dL (ref 0.44–1.00)
GFR calc Af Amer: >60 mL/min (ref >60)
GFR, EST NON AFRICAN AMERICAN: 56 mL/min — AB (ref >60)
Glucose, Bld: 173 mg/dL — ABNORMAL HIGH (ref 70–99)
POTASSIUM: 4.5 mmol/L (ref 3.5–5.1)
Sodium: 134 mmol/L — ABNORMAL LOW (ref 135–145)

## 2018-06-18 LAB — GLUCOSE, CAPILLARY
Glucose-Capillary: 120 mg/dL — ABNORMAL HIGH (ref 70–99)
Glucose-Capillary: 147 mg/dL — ABNORMAL HIGH (ref 70–99)
Glucose-Capillary: 120 mg/dL — ABNORMAL HIGH (ref 70–99)
Glucose-Capillary: 113 mg/dL — ABNORMAL HIGH (ref 70–99)

## 2018-06-18 LAB — T3: T3, Total: 129 ng/dL (ref 71–180)

## 2018-06-18 LAB — MAGNESIUM: Magnesium: 1.9 mg/dL (ref 1.7–2.4)

## 2018-06-18 MED ORDER — SODIUM CHLORIDE 0.9% FLUSH
10.0000 mL | Freq: Two times a day (BID) | INTRAVENOUS | Status: DC
  Administered 2018-06-18: 10 mL

## 2018-06-18 MED ORDER — IOPAMIDOL (ISOVUE-370) INJECTION 76%
INTRAVENOUS | Status: AC
  Filled 2018-06-18: qty 100

## 2018-06-18 MED ORDER — IOHEXOL 350 MG/ML SOLN
100.0000 mL | Freq: Once | INTRAVENOUS | Status: AC | PRN
  Administered 2018-06-18: 70 mL via INTRAVENOUS

## 2018-06-18 MED ORDER — SODIUM CHLORIDE 0.9% FLUSH: 10.0000 mL | INTRAVENOUS | Status: DC | PRN

## 2018-06-18 NOTE — Progress Notes (Signed)
PROGRESS NOTE                                                                                                                                                                                                             Patient Demographics:    Caitlin Oliver, is a 78 y.o. female, DOB - 27-Oct-1940, TWS:[id number]  Admit date - 06/16/2018   Admitting Physician Rise Patience, MD  Outpatient Primary MD for the patient is [name], PA-C  LOS - 1  Chief Complaint  Patient presents with  . Altered Mental Status       Brief Narrative  Caitlin Oliver is a 78 y.o. female with history of hypertension, hyperlipidemia, peripheral vascular disease status post bilateral carotid endarterectomy and lower extremity stent placement, depression, diabetes mellitus type 2 was brought to the ER after patient had a brief episode of syncope while walking to go to the PCPs office.  Over the last 3 days patient has been finding increasingly difficult to walk with ataxia and staggering towards the left side. In the ER she was diagnosed with CVA.   Subjective:    Caitlin Oliver today has, No headache, No chest pain, No abdominal pain - No Nausea, No new weakness tingling or numbness, No Cough - SOB.    Assessment  & Plan :     1.  Right precentral gyrus 5 mm acute ischemic stroke.  Symptoms have resolved, A1c elevated, LDL at goal, seen by neuro, case discussed with Dr. Leonie Man, echo stable, currently on dual antiplatelet therapy along with statin which will be continued.  PT OT and speech evaluation.  Patient overall quite weak and deconditioned per PT SNF.  Patient not safe for home discharge per PT.  2. Dyslipidemia.  LDL uncontrolled continue home dose statin.  3.  Severe pulmonary hypertension.  Diagnosed on echocardiogram this admission.  Oxygen treatment.  Outpatient pulmonary follow-up, may require sleep study to rule out underlying sleep apnea. Borderline elevated D Dimer, mild  atypical chest pain, will check CTA along with bilateral lower extremity venous duplex.  4.  Diastolic CHF with EF 00%.  Currently compensated.  5.  Thyroid nodule noted on CTA.  Stable thyroid enzymes including stable TSH free T4 and T3, thyroid ultrasound does show a suspicious nodule.  Will defer to PCP, she will eventually require needle aspiration and biopsy.  6.  Hypertension.  On Cardizem and metoprolol.  Continue PRN hydralazine.  7. PAD and history of carotid artery  disease.  Has had carotid surgery in the past with Dr. Trula Slade, currently on dual antiplatelet therapy and statin, CT angiogram had nonspecific changes for which carotid venous duplex was done on the recommendation of Dr. Leonie Man, this is unremarkable we will have patient follow with her vascular surgeon post discharge.  8.  HX of lung cancer status post lobectomy.  No acute issues.  9.  Syncope due to dehydration deconditioning.  IV fluids, monitor on telemetry, echo noted, PT, SNF.  Continue to monitor orthostatics.  10.  ARF due to dehydration.  Resolved after IV fluids.  11.  DM type II.  Poor control due to hyperglycemia.  DM and insulin education, Lantus and sliding scale here.  Hold Glucophage.  Lab Results  Component Value Date   HGBA1C 7.9 (H) 06/17/2018   CBG (last 3)  Recent Labs    06/17/18 1657 06/17/18 2136 06/18/18 0826  GLUCAP 126* 141* 120*     Family Communication  :  None  Code Status :  Full  Disposition Plan  :  SNF  Consults  :  Neuro  Procedures  :    CTA  - 1. Negative CTA for large vessel occlusion. 2. Severe atheromatous disease about the aortic arch and origin of the great vessels. Resultant stenoses of up to 65% at the right brachiocephalic artery, 25% at the proximal left common carotid artery, 60-70% at the origin of the right common carotid artery and 75% at the proximal right subclavian artery. Vascular stent in place at the origin of the left subclavian artery with widely  patent flow. 3. Sequelae of prior bilateral CEAs without significant residual stenosis. Atheromatous stenosis of up to 70% at the distal right ICA. No other significant stenosis about the left ICA. 4. Relative sparing of the posterior circulation with wide patency of the vertebrobasilar system. Attenuated flow within the right vertebral artery likely related to the above-mentioned proximal right subclavian artery stenosis. 5. 3.2 cm left thyroid if not already performed. Dedicated nonemergent thyroid ultrasound recommended 6. Emphysema with postoperative changes within the partially visualized left lung  MRI - 1. 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 2. Underlying advanced cerebral white matter changes, most likely related to chronic microvascular ischemic disease, similar to previous.  TTE -    1. The left ventricle has normal systolic function with an ejection fraction of 60-65%. The cavity size was normal. Moderate septal hypertrophy. Left ventricular diastolic Doppler parameters are consistent with impaired relaxation.  2. The right ventricle has mildly reduced systolic function. The cavity was mildly enlarged. There is no increase in right ventricular wall thickness. Right ventricular systolic pressure is severely elevated with an estimated pressure of 69.9 mmHg.  3. Left atrial size was moderately dilated.  4. The mitral valve is normal in structure.  5. The tricuspid valve is normal in structure.  6. The aortic valve is normal in structure.  7. The pulmonic valve was normal in structure.  8. There is mild dilatation of the ascending aorta.   Carotid US -  Right Carotid: Velocities in the right ICA are consistent with a 1-39% stenosis.  Left Carotid: Velocities in the left ICA are consistent with a 1-39% stenosis.  Thyroid US -   1. Thyromegaly with multiple nodules. 2. Recommend FNA biopsy of moderately suspicious 1.5 cm  inferior left nodule (#9). 3. Recommend annual/biennial ultrasound follow-up of additional nodules as above, until stability x5 years confirmed.   DVT  Prophylaxis  :  Lovenox    Lab Results  Component Value Date   PLT 245 06/17/2018    Diet :  Diet Order            Diet heart healthy/carb modified Room service appropriate? Yes; Fluid consistency: Thin  Diet effective now               Inpatient Medications Scheduled Meds: . aspirin EC  81 mg Oral Daily  . clopidogrel  75 mg Oral Daily  . diltiazem  180 mg Oral Daily  . DULoxetine  60 mg Oral QHS  . enoxaparin (LOVENOX) injection  40 mg Subcutaneous Daily  . insulin aspart  0-9 Units Subcutaneous TID WC  . insulin starter kit- pen needles  1 kit Other Once  . metoprolol tartrate  37.5 mg Oral BID WC  . omega-3 acid ethyl esters  1 g Oral Daily  . pantoprazole  40 mg Oral BID  . pravastatin  40 mg Oral QAC supper   Continuous Infusions: . lactated ringers 75 mL/hr at 06/18/18 0551   PRN Meds:.acetaminophen **OR** [DISCONTINUED] acetaminophen, albuterol, cyclobenzaprine, hydrALAZINE, HYDROcodone-acetaminophen, [DISCONTINUED] ondansetron **OR** ondansetron (ZOFRAN) IV  Antibiotics  :   Anti-infectives (From admission, onward)   None          Objective:   Vitals:   06/17/18 1649 06/17/18 2139 06/18/18 0025 06/18/18 0422  BP: (!) 127/96 118/83 127/90 102/74  Pulse: 92 84 80 82  Resp: _0 Temp: 97.8 F (36.6 C) 97.8 F (36.6 C) 97.7 F (36.5 C) 97.8 F (36.6 C)  TempSrc: Oral     SpO2: 95% 93% 95% 100%  Weight:        Wt Readings from Last 3 Encounters:  06/17/18 87.3 kg  03/10/18 84 kg  09/23/17 85.5 kg     Intake/Output Summary (Last 24 hours) at 06/18/2018 1040 Last data filed at 06/18/2018 6979 Gross per 24 hour  Intake 1020 ml  Output -  Net 1020 ml     Physical Exam  Awake Alert, Oriented X 3, No new F.N deficits, Normal affect Caitlin Oliver,PERRAL Supple Neck,No JVD, No cervical  lymphadenopathy appriciated.  Symmetrical Chest wall movement, Good air movement bilaterally, CTAB RRR,No Gallops, Rubs or new Murmurs, No Parasternal Heave +ve B.Sounds, Abd Soft, No tenderness, No organomegaly appriciated, No rebound - guarding or rigidity. No Cyanosis, Clubbing or edema, No new Rash or bruise     Data Review:    CBC Recent Labs  Lab 06/16/18 1743 06/16/18 2314 06/17/18 0430  WBC 9.9 9.4 7.3  HGB 13.3 13.7 13.2  HCT 43.4 43.9 43.2  PLT 300 124* 245  MCV 91.4 92.4 90.6  MCH 28.0 28.8 27.7  MCHC 30.6 31.2 30.6  RDW 13.6 13.8 13.7  LYMPHSABS 1.4  --  1.0  MONOABS 0.7  --  0.6  EOSABS 0.1  --  0.1  BASOSABS 0.1  --  0.0    Chemistries  Recent Labs  Lab 06/16/18 1743 06/16/18 1759 06/16/18 2314 06/17/18 0430 06/18/18 0352  NA 132*  --   --  133* 134*  K 5.1  --   --  4.7 4.5  CL 96*  --   --  94* 97*  CO2 23  --   --  26 28  GLUCOSE 131*  --   --  117* 173*  BUN 30*  --   --  24* 21  CREATININE 1.32* 1.00 1.01* 0.88 0.98  CALCIUM  9.4  --   --  9.0 8.4*  MG  --   --   --   --  1.9  AST 51*  --   --  48*  --   ALT 64*  --   --  63*  --   ALKPHOS 70  --   --  67  --   BILITOT 0.7  --   --  0.9  --    ------------------------------------------------------------------------------------------------------------------ Recent Labs    06/17/18 0430  CHOL 99  HDL 31*  LDLCALC 44  TRIG 122  CHOLHDL 3.2    Lab Results  Component Value Date   HGBA1C 7.9 (H) 06/17/2018   ------------------------------------------------------------------------------------------------------------------ Recent Labs    06/17/18 0430  TSH 1.620   ------------------------------------------------------------------------------------------------------------------ No results for input(s): VITAMINB12, FOLATE, FERRITIN, TIBC, IRON, RETICCTPCT in the last 72 hours.  Coagulation profile Recent Labs  Lab 06/16/18 1743  INR 1.1    Recent Labs    06/16/18 2314  06/17/18 1707  DDIMER 0.85* 0.86*    Cardiac Enzymes Recent Labs  Lab 06/16/18 2314 06/17/18 0430 06/17/18 1052  TROPONINI <0.03 <0.03 <0.03   ------------------------------------------------------------------------------------------------------------------ No results found for: BNP  Micro Results No results found for this or any previous visit (from the past 240 hour(s)).  Radiology Reports Ct Angio Head W Or Wo Contrast  Result Date: 06/17/2018 CLINICAL DATA:  Follow-up examination for acute stroke. EXAM: CT ANGIOGRAPHY HEAD AND NECK TECHNIQUE: Multidetector CT imaging of the head and neck was performed using the standard protocol during bolus administration of intravenous contrast. Multiplanar CT image reconstructions and MIPs were obtained to evaluate the vascular anatomy. Carotid stenosis measurements (when applicable) are obtained utilizing NASCET criteria, using the distal internal carotid diameter as the denominator. CONTRAST:  4m ISOVUE-370 IOPAMIDOL (ISOVUE-370) INJECTION 76% COMPARISON:  Prior MRI from earlier same day. FINDINGS: CTA NECK FINDINGS Aortic arch: Visualized aortic arch of normal caliber with normal branch pattern. Heavy atheromatous plaque about the visualized arch and origin of the great vessels. Associated stenosis of up to 60% at the right brachiocephalic artery. Narrowing short-segment severe approximate 80% stenosis at the proximal left common carotid artery (series 8, image 297). Heavy atheromatous plaque at the origin of the left subclavian artery with left subclavian stent in place. Widely patent flow through the stent without significant intra stent stenosis. Additional approximate 50% stenosis at the mid left subclavian artery (series 8, image 267). Focal stenosis of up to approximate 75% at the origin of the right subclavian artery. Right carotid system: Atheromatous plaque at the origin of the right common carotid artery with associated stenosis of up to  approximate 60-70% by NASCET criteria. Additional scattered plaque within the right CCA distally with scattered mild multifocal narrowing. Prior right carotid endarterectomy without significant residual stenosis. Right ICA widely patent at its proximal and mid aspect. Atheromatous plaque at the distal right ICA with associated stenosis of up to 70% by NASCET criteria (series 8, image 146). No acute vascular abnormality within the right carotid artery system. Left carotid system: Focal 80% stenosis at the proximal left common carotid artery again noted. Additional scattered plaque within the mid and distal left CCA with up to approximate 50% stenosis by NASCET criteria. Prior left carotid endarterectomy with residual mild 20% narrowing of the proximal left ICA. Left ICA otherwise widely patent distally without stenosis or acute vascular abnormality. Short-segment fenestration versus chronic dissection flap noted at the proximal left external carotid artery (series 8, image 194). Vertebral arteries:  Both vertebral arteries arise from the subclavian arteries. Left vertebral artery dominant and widely patent within the neck. Markedly attenuated flow within the right vertebral artery, most likely related to the previously identified high-grade proximal right subclavian artery stenosis. Right vertebral artery otherwise grossly patent without appreciable stenosis. Skeleton: No acute osseous abnormality. No discrete lytic or blastic osseous lesions. Degenerative spondylolysis noted at C5-6. Other neck: No acute soft tissue abnormality within the neck. No adenopathy. Heterogeneous left thyroid nodule measures up to approximately 3.2 cm. Upper chest: Partially visualized upper chest demonstrates no acute finding. Centrilobular and paraseptal emphysematous changes with superimposed atelectasis noted. Pleuroparenchymal thickening about the partially visualized left lung suspected to at least in part be postoperative related to  prior lobectomy. Review of the MIP images confirms the above findings CTA HEAD FINDINGS Anterior circulation: Petrous segments widely patent. Scattered atheromatous plaque within the cavernous/supraclinoid ICAs with resultant mild to moderate diffuse narrowing bilaterally (no more than 50%). ICA termini well perfused. A1 segments widely patent. Normal anterior communicating artery. Anterior cerebral arteries widely patent to their distal aspects. No M1 stenosis or occlusion. Distal MCA branches well perfused and symmetric. Posterior circulation: Vertebral arteries widely patent to the vertebrobasilar junction without stenosis. Adequate opacification of the right V4 segment likely at least partially retrograde in nature across the vertebrobasilar junction. Posterior inferior cerebral arteries patent bilaterally. Basilar widely patent proximally, with mild narrowing at the basilar tip. Superior cerebral arteries patent bilaterally. Probable duplicated right SCA noted. Both of the posterior cerebral arteries primarily supplied via the basilar. PCAs widely patent to their distal aspects. Small bilateral posterior communicating arteries noted. Venous sinuses: Patent. Anatomic variants: None significant. Delayed phase: No abnormal enhancement. Review of the MIP images confirms the above findings IMPRESSION: 1. Negative CTA for large vessel occlusion. 2. Severe atheromatous disease about the aortic arch and origin of the great vessels. Resultant stenoses of up to 65% at the right brachiocephalic artery, 62% at the proximal left common carotid artery, 60-70% at the origin of the right common carotid artery and 75% at the proximal right subclavian artery. Vascular stent in place at the origin of the left subclavian artery with widely patent flow. 3. Sequelae of prior bilateral CEAs without significant residual stenosis. Atheromatous stenosis of up to 70% at the distal right ICA. No other significant stenosis about the left  ICA. 4. Relative sparing of the posterior circulation with wide patency of the vertebrobasilar system. Attenuated flow within the right vertebral artery likely related to the above-mentioned proximal right subclavian artery stenosis. 5. 3.2 cm left thyroid if not already performed. Dedicated nonemergent thyroid ultrasound recommended 6. Emphysema with postoperative changes within the partially visualized left lung. Electronically Signed   By: Jeannine Boga M.D.   On: 06/17/2018 04:34   Ct Head Wo Contrast  Result Date: 06/16/2018 CLINICAL DATA:  Focal neurologic deficits with confusion and weakness EXAM: CT HEAD WITHOUT CONTRAST TECHNIQUE: Contiguous axial images were obtained from the base of the skull through the vertex without intravenous contrast. COMPARISON:  10/20/2015 FINDINGS: Brain: Diffuse moderate to marked small vessel ischemic disease of periventricular and subcortical white matter without acute hemorrhage, intra-axial mass nor extra-axial fluid collections. No midline shift or edema. No large vascular territory infarct. Global atrophy is stable. Vascular: Atherosclerosis of the carotid siphons. No hyperdense vessel sign. Skull: Mild osseous demineralization skull base without acute fracture or bone destruction. Sinuses/Orbits: No acute finding. Other: None IMPRESSION: Atrophy with chronic, largely stable degree of moderate to marked small vessel  ischemic disease of periventricular and subcortical white matter. No acute intracranial abnormality. Electronically Signed   By: Ashley Royalty M.D.   On: 06/16/2018 18:14   Ct Angio Neck W Or Wo Contrast  Result Date: 06/17/2018 CLINICAL DATA:  Follow-up examination for acute stroke. EXAM: CT ANGIOGRAPHY HEAD AND NECK TECHNIQUE: Multidetector CT imaging of the head and neck was performed using the standard protocol during bolus administration of intravenous contrast. Multiplanar CT image reconstructions and MIPs were obtained to evaluate the vascular  anatomy. Carotid stenosis measurements (when applicable) are obtained utilizing NASCET criteria, using the distal internal carotid diameter as the denominator. CONTRAST:  13m ISOVUE-370 IOPAMIDOL (ISOVUE-370) INJECTION 76% COMPARISON:  Prior MRI from earlier same day. FINDINGS: CTA NECK FINDINGS Aortic arch: Visualized aortic arch of normal caliber with normal branch pattern. Heavy atheromatous plaque about the visualized arch and origin of the great vessels. Associated stenosis of up to 60% at the right brachiocephalic artery. Narrowing short-segment severe approximate 80% stenosis at the proximal left common carotid artery (series 8, image 297). Heavy atheromatous plaque at the origin of the left subclavian artery with left subclavian stent in place. Widely patent flow through the stent without significant intra stent stenosis. Additional approximate 50% stenosis at the mid left subclavian artery (series 8, image 267). Focal stenosis of up to approximate 75% at the origin of the right subclavian artery. Right carotid system: Atheromatous plaque at the origin of the right common carotid artery with associated stenosis of up to approximate 60-70% by NASCET criteria. Additional scattered plaque within the right CCA distally with scattered mild multifocal narrowing. Prior right carotid endarterectomy without significant residual stenosis. Right ICA widely patent at its proximal and mid aspect. Atheromatous plaque at the distal right ICA with associated stenosis of up to 70% by NASCET criteria (series 8, image 146). No acute vascular abnormality within the right carotid artery system. Left carotid system: Focal 80% stenosis at the proximal left common carotid artery again noted. Additional scattered plaque within the mid and distal left CCA with up to approximate 50% stenosis by NASCET criteria. Prior left carotid endarterectomy with residual mild 20% narrowing of the proximal left ICA. Left ICA otherwise widely  patent distally without stenosis or acute vascular abnormality. Short-segment fenestration versus chronic dissection flap noted at the proximal left external carotid artery (series 8, image 194). Vertebral arteries: Both vertebral arteries arise from the subclavian arteries. Left vertebral artery dominant and widely patent within the neck. Markedly attenuated flow within the right vertebral artery, most likely related to the previously identified high-grade proximal right subclavian artery stenosis. Right vertebral artery otherwise grossly patent without appreciable stenosis. Skeleton: No acute osseous abnormality. No discrete lytic or blastic osseous lesions. Degenerative spondylolysis noted at C5-6. Other neck: No acute soft tissue abnormality within the neck. No adenopathy. Heterogeneous left thyroid nodule measures up to approximately 3.2 cm. Upper chest: Partially visualized upper chest demonstrates no acute finding. Centrilobular and paraseptal emphysematous changes with superimposed atelectasis noted. Pleuroparenchymal thickening about the partially visualized left lung suspected to at least in part be postoperative related to prior lobectomy. Review of the MIP images confirms the above findings CTA HEAD FINDINGS Anterior circulation: Petrous segments widely patent. Scattered atheromatous plaque within the cavernous/supraclinoid ICAs with resultant mild to moderate diffuse narrowing bilaterally (no more than 50%). ICA termini well perfused. A1 segments widely patent. Normal anterior communicating artery. Anterior cerebral arteries widely patent to their distal aspects. No M1 stenosis or occlusion. Distal MCA branches well perfused and  symmetric. Posterior circulation: Vertebral arteries widely patent to the vertebrobasilar junction without stenosis. Adequate opacification of the right V4 segment likely at least partially retrograde in nature across the vertebrobasilar junction. Posterior inferior cerebral  arteries patent bilaterally. Basilar widely patent proximally, with mild narrowing at the basilar tip. Superior cerebral arteries patent bilaterally. Probable duplicated right SCA noted. Both of the posterior cerebral arteries primarily supplied via the basilar. PCAs widely patent to their distal aspects. Small bilateral posterior communicating arteries noted. Venous sinuses: Patent. Anatomic variants: None significant. Delayed phase: No abnormal enhancement. Review of the MIP images confirms the above findings IMPRESSION: 1. Negative CTA for large vessel occlusion. 2. Severe atheromatous disease about the aortic arch and origin of the great vessels. Resultant stenoses of up to 65% at the right brachiocephalic artery, 09% at the proximal left common carotid artery, 60-70% at the origin of the right common carotid artery and 75% at the proximal right subclavian artery. Vascular stent in place at the origin of the left subclavian artery with widely patent flow. 3. Sequelae of prior bilateral CEAs without significant residual stenosis. Atheromatous stenosis of up to 70% at the distal right ICA. No other significant stenosis about the left ICA. 4. Relative sparing of the posterior circulation with wide patency of the vertebrobasilar system. Attenuated flow within the right vertebral artery likely related to the above-mentioned proximal right subclavian artery stenosis. 5. 3.2 cm left thyroid if not already performed. Dedicated nonemergent thyroid ultrasound recommended 6. Emphysema with postoperative changes within the partially visualized left lung. Electronically Signed   By: Jeannine Boga M.D.   On: 06/17/2018 04:34   Mr Brain Wo Contrast  Result Date: 06/17/2018 CLINICAL DATA:  Initial evaluation for gradually progressive weakness for several days, ataxia. EXAM: MRI HEAD WITHOUT CONTRAST TECHNIQUE: Multiplanar, multiecho pulse sequences of the brain and surrounding structures were obtained without  intravenous contrast. COMPARISON:  Prior CT from 06/16/2018 as well as previous MRI from 06/13/2016 FINDINGS: Brain: Examination moderately degraded by motion artifact. Mildly advanced age-related cerebral atrophy. Extensive confluent T2/FLAIR hyperintensity throughout the periventricular and deep white matter both cerebral hemispheres, with involvement of the pons, most likely related chronic microvascular ischemic disease. Few small superimposed remote lacunar infarcts within the right thalamus. Punctate 5 mm infarct involving the cortical gray matter of the right precentral gyrus (series 5, image 64). No associated hemorrhage or mass effect. No other evidence for acute or subacute ischemia. Gray-white matter differentiation otherwise maintained. No other areas of remote cortical infarction. No foci of susceptibility artifact to suggest acute or chronic intracranial hemorrhage. No mass lesion, midline shift or mass effect. No hydrocephalus. No extra-axial fluid collection. Pituitary gland suprasellar region normal. Midline structures intact and normal. Vascular: Major intracranial vascular flow voids maintained at the skull base. Skull and upper cervical spine: Craniocervical junction normal. Upper cervical spine within normal limits. Bone marrow signal intensity normal. No scalp soft tissue abnormality. Sinuses/Orbits: Globes orbital soft tissues within normal limits. Paranasal sinuses are clear. Left-sided mastoid effusion with partial opacification of the left middle ear cavity, similar to previous, and of doubtful significance in the acute setting. Trace fluid noted within the right mastoid air cells as well. Other: None. IMPRESSION: 1. 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 2. Underlying advanced cerebral white matter changes, most likely related to chronic microvascular ischemic disease, similar to previous. Electronically Signed   By: Jeannine Boga M.D.   On: 06/17/2018 01:38   Vas [place]  Abi With/wo Tbi  Result Date: 06/11/2018 LOWER EXTREMITY DOPPLER STUDY Indications: In 05/2017, an arterial Doppler showed an ABI of 1.20 on the right              and 1.17 on the left. Patient c/o intermittent worsening bilateral              lower extremity pain and weakness when lying down and standing.              Difficulty with walking, feels like both legs are going to "give              out". High Risk Factors: Hypertension, hyperlipidemia, Diabetes, past history of                    smoking, prior TIA. Other Factors: COPD.  Vascular Interventions: Atherectomy, PTA, and stenting of bilateral common iliac                         arteries in 07/2013. Performing Technologist: Sharlett Iles RVT  Examination Guidelines: A complete evaluation includes at minimum, Doppler waveform signals and systolic blood pressure reading at the level of bilateral brachial, anterior tibial, and posterior tibial arteries, when vessel segments are accessible. Bilateral testing is considered an integral part of a complete examination. Photoelectric Plethysmograph (PPG) waveforms and toe systolic pressure readings are included as required and additional duplex testing as needed. Limited examinations for reoccurring indications may be performed as noted.  ABI Findings: +---------+------------------+-----+---------+--------+ Right    Rt Pressure (mmHg)IndexWaveform Comment  +---------+------------------+-----+---------+--------+ Brachial 132                                      +---------+------------------+-----+---------+--------+ ATA      141               1.07 triphasic         +---------+------------------+-----+---------+--------+ PTA      153               1.16 triphasic         +---------+------------------+-----+---------+--------+ PERO     140               1.06 triphasic         +---------+------------------+-----+---------+--------+ Great  Toe95                0.72 Normal            +---------+------------------+-----+---------+--------+ +---------+------------------+-----+---------+-------+ Left     Lt Pressure (mmHg)IndexWaveform Comment +---------+------------------+-----+---------+-------+ Brachial 129                                     +---------+------------------+-----+---------+-------+ ATA      144               1.09 triphasic        +---------+------------------+-----+---------+-------+ PTA      146               1.11 triphasic        +---------+------------------+-----+---------+-------+ PERO     143               1.08 triphasic        +---------+------------------+-----+---------+-------+ Rosalene Billings  0.72 Normal           +---------+------------------+-----+---------+-------+ +-------+-----------+-----------+------------+------------+ ABI/TBIToday's ABIToday's TBIPrevious ABIPrevious TBI +-------+-----------+-----------+------------+------------+ Right  1.16       .72        1.20        .77          +-------+-----------+-----------+------------+------------+ Left   1.11       .72        1.17        .82          +-------+-----------+-----------+------------+------------+ Right and Left ABIs and TBIs appear essentially unchanged compared to prior study on 06/11/2017.  Summary: Right: Resting right ankle-brachial index is within normal range. No evidence of significant right lower extremity arterial disease. The right toe-brachial index is normal. Left: Resting left ankle-brachial index is within normal range. No evidence of significant left lower extremity arterial disease. The left toe-brachial index is normal.  *See table(s) above for measurements and observations.  Suggest follow up study in 12 months. See Aortoiliac Duplex report. Electronically signed by Ena Dawley MD on 06/11/2018 at 10:45:03 AM.    Final    US Thyroid  Result Date: 06/17/2018 CLINICAL  DATA:  Nodule. Hyperthyroidism. Previous FNA biopsy of isthmic nodule 12/01/2015. EXAM: THYROID ULTRASOUND TECHNIQUE: Ultrasound examination of the thyroid gland and adjacent soft tissues was performed. COMPARISON:  11/24/2015 FINDINGS: Parenchymal Echotexture: Moderately heterogenous Isthmus: 0.9 cm thickness, previously 0.6 Right lobe: 6.4 x 3.3 x 2.7 cm, previously 6.7 x 2.7 x 2.5 Left lobe: 4.8 x 2.6 x 3 cm, previously 4.9 x 2.2 x 2.4 _________________________________________________________ Estimated total number of nodules >/= 1 cm: 6-10 Number of spongiform nodules >/=  2 cm not described below (TR1): 0 Number of mixed cystic and solid nodules >/= 1.5 cm not described below ([place]): 0 _________________________________________________________ Nodule # 1: 3.1 x 1.9 x 2.7 cm left isthmic nodule, previously 2.9 x 1.5 x 2.7; this was previously biopsied 0.9 cm benign colloid cyst, mid isthmus 1.3 cm benign colloid cyst, posterior mid right, previously 1.2 1.2 cm spongiform nodule, superior right, previously 1.3; This nodule does NOT meet TI-RADS criteria for biopsy or dedicated follow-up. Nodule # 5: Prior biopsy: No Location: Right; Inferior Maximum size: 1.1 cm; Other 2 dimensions: 0.8 x 0.9 cm, previously, 1.4 x 1.1 x 1.1 cm Composition: solid/almost completely solid (2) Echogenicity: isoechoic (1) Shape: not taller-than-wide (0) Margins: ill-defined (0) Echogenic foci: none (0) ACR TI-RADS total points: 3. ACR TI-RADS risk category:  TR3 (3 points). Significant change in size (>/= 20% in two dimensions and minimal increase of 2 mm): No Change in features: No Change in ACR TI-RADS risk category: No ACR TI-RADS recommendations: Given size (<1.4 cm) and appearance, this nodule does NOT meet TI-RADS criteria for biopsy or dedicated follow-up. _________________________________________________________ 0.9 cm complex cyst, superior right, previously 1 cm; This nodule does NOT meet TI-RADS criteria for biopsy or  dedicated follow-up. 1.5 cm benign colloid cyst, superior left, previously 1.3 Nodule # 8: Prior biopsy: No Location: Left; Mid Maximum size: 1.4 cm; Other 2 dimensions: 1 x 1.1 cm, previously, 1.6 x 1 x 1.1 cm Composition: mixed cystic and solid (1) Echogenicity: hypoechoic (2) Shape: not taller-than-wide (0) Margins: smooth (0) Echogenic foci: none (0) ACR TI-RADS total points: 3. ACR TI-RADS risk category:  TR3 (3 points). Significant change in size (>/= 20% in two dimensions and minimal increase of 2 mm): No Change in features: No Change in ACR TI-RADS risk category: No ACR TI-RADS recommendations:  Given size (<1.4 cm) and appearance, this nodule does NOT meet TI-RADS criteria for biopsy or dedicated follow-up. _________________________________________________________ Nodule # 9: Prior biopsy: No Location: Left; Inferior Maximum size: 1.5 cm; Other 2 dimensions: 0.8 x 1.1 cm, previously, 1.4 x 1.1 x 1.4 cm Composition: solid/almost completely solid (2) Echogenicity: hypoechoic (2) Shape: not taller-than-wide (0) Margins: smooth (0) Echogenic foci: large comet-tail artifacts (0) ACR TI-RADS total points: 4. ACR TI-RADS risk category:  TR4 (4-6 points). Significant change in size (>/= 20% in two dimensions and minimal increase of 2 mm): No Change in features: No Change in ACR TI-RADS risk category: No ACR TI-RADS recommendations: **Given size (>/= 1.5 cm) and appearance, fine needle aspiration of this moderately suspicious nodule should be considered based on TI-RADS criteria. _________________________________________________________ Nodule # 10: Prior biopsy: No Location: Left; Inferior posterior Maximum size: 1.4 cm; Other 2 dimensions: 0.9 x 1.3 cm, previously, 1.5 x 0.8 x 1.1 cm Composition: solid/almost completely solid (2) Echogenicity: hypoechoic (2) Shape: not taller-than-wide (0) Margins: smooth (0) Echogenic foci: none (0) ACR TI-RADS total points: 4. ACR TI-RADS risk category:  TR4 (4-6 points).  Significant change in size (>/= 20% in two dimensions and minimal increase of 2 mm): No Change in features: No Change in ACR TI-RADS risk category: No ACR TI-RADS recommendations: *Given size (>/= 1 - 1.4 cm) and appearance, a follow-up ultrasound in 1 year should be considered based on TI-RADS criteria. IMPRESSION: 1. Thyromegaly with multiple nodules. 2. Recommend FNA biopsy of moderately suspicious 1.5 cm inferior left nodule (#9). 3. Recommend annual/biennial ultrasound follow-up of additional nodules as above, until stability x5 years confirmed. The above is in keeping with the ACR TI-RADS recommendations - J Am Coll Radiol 2017;14:587-595. Electronically Signed   By: Lucrezia Europe M.D.   On: 06/17/2018 17:03   Vas US Carotid  Result Date: 06/17/2018 Carotid Arterial Duplex Study Indications: CVA. Limitations: breathing interference Performing Technologist: June Leap RDMS, RVT  Examination Guidelines: A complete evaluation includes B-mode imaging, spectral Doppler, color Doppler, and power Doppler as needed of all accessible portions of each vessel. Bilateral testing is considered an integral part of a complete examination. Limited examinations for reoccurring indications may be performed as noted.  Right Carotid Findings: +----------+--------+--------+--------+------------+--------+           PSV cm/sEDV cm/sStenosisDescribe    Comments +----------+--------+--------+--------+------------+--------+ CCA Prox  47      7                                    +----------+--------+--------+--------+------------+--------+ CCA Distal38      13              heterogenous         +----------+--------+--------+--------+------------+--------+ ICA Prox  29      10      1-39%   heterogenous         +----------+--------+--------+--------+------------+--------+ ICA Distal32      12                                   +----------+--------+--------+--------+------------+--------+ ECA       27       7                                    +----------+--------+--------+--------+------------+--------+ +----------+--------+-------+-----------------+-------------------+  PSV cm/sEDV cmsDescribe         Arm Pressure (mmHG) +----------+--------+-------+-----------------+-------------------+ [id number]             atypical waveform                    +----------+--------+-------+-----------------+-------------------+ +---------+--------+--+--------+----------+ VertebralPSV cm/s16EDV cm/sRetrograde +---------+--------+--+--------+----------+ dampened monophasic flow throughout carotid  Left Carotid Findings: +----------+--------+--------+--------+-------------------------+--------+           PSV cm/sEDV cm/sStenosisDescribe                 Comments +----------+--------+--------+--------+-------------------------+--------+ CCA Prox  62      13                                                +----------+--------+--------+--------+-------------------------+--------+ CCA Distal81      14              heterogenous and calcific         +----------+--------+--------+--------+-------------------------+--------+ ICA Prox  123     20              heterogenous                      +----------+--------+--------+--------+-------------------------+--------+ ICA Distal97      24                                                +----------+--------+--------+--------+-------------------------+--------+ ECA       56      4                                                 +----------+--------+--------+--------+-------------------------+--------+ +----------+--------+--------+-----------------+-------------------+ SubclavianPSV cm/sEDV cm/sDescribe         Arm Pressure (mmHG) +----------+--------+--------+-----------------+-------------------+           175             atypical waveform                     +----------+--------+--------+-----------------+-------------------+ +---------+--------+--+--------+--+---------+ VertebralPSV cm/s75EDV cm/s13Antegrade +---------+--------+--+--------+--+---------+ monophasic flow throughout carotid  Summary: Right Carotid: Velocities in the right ICA are consistent with a 1-39% stenosis. Left Carotid: Velocities in the left ICA are consistent with a 1-39% stenosis. Vertebrals:  Bilateral vertebral arteries demonstrate antegrade flow. Subclavians: Normal flow hemodynamics were seen in bilateral subclavian              arteries. *See table(s) above for measurements and observations.  Electronically signed by Antony Contras MD on 06/17/2018 at 3:20:50 PM.    Final    Vas US Aorta/ivc/iliacs  Result Date: 06/11/2018 ABDOMINAL AORTA STUDY Indications: In 05/2017, an aortoiliac duplex showed stable >50% restenosis in              the right common iliac artery stent and patent left common iliac              artery stent without restenosis. Patient c/o intermittent worsening              bilateral lower extremity pain and weakness when lying down and  standing. Difficulty with walking, feels like both legs are going              to "give out". Risk Factors: Hypertension, hyperlipidemia, Diabetes, past history of smoking,               prior TIA. Vascular Interventions: Atherectomy, PTA, and stenting of bilateral common iliac                         arteries in 07/2013. Limitations: Air/bowel gas and obesity.   Performing Technologist: Sharlett Iles RVT  Examination Guidelines: A complete evaluation includes B-mode imaging, spectral Doppler, color Doppler, and power Doppler as needed of all accessible portions of each vessel. Bilateral testing is considered an integral part of a complete examination. Limited examinations for reoccurring indications may be performed as noted.  Abdominal Aorta Findings:  +-----------+-------+----------+----------+---------+--------+--------+ Location   AP (cm)Trans (cm)PSV (cm/s)Waveform ThrombusComments +-----------+-------+----------+----------+---------+--------+--------+ Proximal                    54                                  +-----------+-------+----------+----------+---------+--------+--------+ Distal     NV                                                   +-----------+-------+----------+----------+---------+--------+--------+ RT EIA Prox                 118       triphasic                 +-----------+-------+----------+----------+---------+--------+--------+ LT EIA Prox                 84        biphasic                  +-----------+-------+----------+----------+---------+--------+--------+ Aortoiliac atherosclerosis. No evidence of stenosis in the bilateral external iliac arteries.  IVC/Iliac Findings: +--------+------+--------+--------+   IVC   PatentThrombusComments +--------+------+--------+--------+ IVC Proxpatent                 +--------+------+--------+--------+  Right Stent(s): +-------------------+--------+--------------+---------+--------+ Common Iliac ArteryPSV cm/sStenosis      Waveform Comments +-------------------+--------+--------------+---------+--------+ Prox to Stent      109                   triphasic         +-------------------+--------+--------------+---------+--------+ Proximal Stent     178     1-49% stenosisbiphasic          +-------------------+--------+--------------+---------+--------+ Mid Stent          160                   biphasic          +-------------------+--------+--------------+---------+--------+ Distal Stent       108                   biphasic          +-------------------+--------+--------------+---------+--------+ Distal to Stent    111                   biphasic          +-------------------+--------+--------------+---------+--------+  Patent right common  iliac artery stent with mildly elevated velocities in the proximal and mid stent segment. These velocities appear decreased when compared to the previous exam.  Left Stent(s): +-------------------+--------+--------+--------+--------+ Common Iliac ArteryPSV cm/sStenosisWaveformComments +-------------------+--------+--------+--------+--------+ Prox to Stent      170             biphasic         +-------------------+--------+--------+--------+--------+ Proximal Stent     103             biphasic         +-------------------+--------+--------+--------+--------+ Mid Stent          87              biphasic         +-------------------+--------+--------+--------+--------+ Distal Stent       74              biphasic         +-------------------+--------+--------+--------+--------+ Distal to Stent    115             biphasic         +-------------------+--------+--------+--------+--------+ Patent left common iliac artery stent without focal stenosis. Poor image quality due to body habitus, abdominal rigidity, overlying bowel gas and rapid respiration.  Summary: Stenosis: +------------------+---------------------------------------------+ Location          Stent                                         +------------------+---------------------------------------------+ Right Common IliacNow <50% stenosis, a decrease from prior exam +------------------+---------------------------------------------+ Patent left common iliac artery stent without focal stenosis. Patent IVC. *See table(s) above for measurements and observations. Suggest follow up study in 12 months.  See ABI report. Electronically signed by Ena Dawley MD on 06/11/2018 at 11:31:06 AM.    Final     Time Spent in minutes  30   Lala Lund M.D on 06/18/2018 at 10:40 AM  To page go to [email or web address] - password [email or web address]

## 2018-06-18 NOTE — Progress Notes (Signed)
STROKE TEAM PROGRESS NOTE   INTERVAL HISTORY Patient remains neurologically stable. Carotid ultrasound yesterday showed no significant extracranial stenosis. Lower extremity venous Doppler was also negative for DVT. Plan is to discharge home today.   Vitals:   06/18/18 0422 06/18/18 0800 06/18/18 1210 06/18/18 1614  BP: 102/74 125/78 109/66 (!) 136/93  Pulse: 82 90 79 85  Resp: _0 Temp: 97.8 F (36.6 C) 98 F (36.7 C) 97.9 F (36.6 C) 97.9 F (36.6 C)  TempSrc:  Oral Oral Oral  SpO2: 100% 100% 97% 91%  Weight:        CBC:  Recent Labs  Lab 06/16/18 1743 06/16/18 2314 06/17/18 0430  WBC 9.9 9.4 7.3  NEUTROABS 7.6  --  5.6  HGB 13.3 13.7 13.2  HCT 43.4 43.9 43.2  MCV 91.4 92.4 90.6  PLT 300 124* 527    Basic Metabolic Panel:  Recent Labs  Lab 06/17/18 0430 06/18/18 0352  NA 133* 134*  K 4.7 4.5  CL 94* 97*  CO2 26 28  GLUCOSE 117* 173*  BUN 24* 21  CREATININE 0.88 0.98  CALCIUM 9.0 8.4*  MG  --  1.9   Lipid Panel:     Component Value Date/Time   CHOL 99 06/17/2018 0430   TRIG 122 06/17/2018 0430   HDL 31 (L) 06/17/2018 0430   CHOLHDL 3.2 06/17/2018 0430   VLDL 24 06/17/2018 0430   LDLCALC 44 06/17/2018 0430   HgbA1c:  Lab Results  Component Value Date   HGBA1C 7.9 (H) 06/17/2018   Urine Drug Screen: No results found for: LABOPIA, COCAINSCRNUR, LABBENZ, AMPHETMU, THCU, LABBARB  Alcohol Level No results found for: ETH  IMAGING Ct Angio Head W Or Wo Contrast  Result Date: 06/17/2018 CLINICAL DATA:  Follow-up examination for acute stroke. EXAM: CT ANGIOGRAPHY HEAD AND NECK TECHNIQUE: Multidetector CT imaging of the head and neck was performed using the standard protocol during bolus administration of intravenous contrast. Multiplanar CT image reconstructions and MIPs were obtained to evaluate the vascular anatomy. Carotid stenosis measurements (when applicable) are obtained utilizing NASCET criteria, using the distal internal carotid diameter  as the denominator. CONTRAST:  41m ISOVUE-370 IOPAMIDOL (ISOVUE-370) INJECTION 76% COMPARISON:  Prior MRI from earlier same day. FINDINGS: CTA NECK FINDINGS Aortic arch: Visualized aortic arch of normal caliber with normal branch pattern. Heavy atheromatous plaque about the visualized arch and origin of the great vessels. Associated stenosis of up to 60% at the right brachiocephalic artery. Narrowing short-segment severe approximate 80% stenosis at the proximal left common carotid artery (series 8, image 297). Heavy atheromatous plaque at the origin of the left subclavian artery with left subclavian stent in place. Widely patent flow through the stent without significant intra stent stenosis. Additional approximate 50% stenosis at the mid left subclavian artery (series 8, image 267). Focal stenosis of up to approximate 75% at the origin of the right subclavian artery. Right carotid system: Atheromatous plaque at the origin of the right common carotid artery with associated stenosis of up to approximate 60-70% by NASCET criteria. Additional scattered plaque within the right CCA distally with scattered mild multifocal narrowing. Prior right carotid endarterectomy without significant residual stenosis. Right ICA widely patent at its proximal and mid aspect. Atheromatous plaque at the distal right ICA with associated stenosis of up to 70% by NASCET criteria (series 8, image 146). No acute vascular abnormality within the right carotid artery system. Left carotid system: Focal 80% stenosis at the proximal left common carotid  artery again noted. Additional scattered plaque within the mid and distal left CCA with up to approximate 50% stenosis by NASCET criteria. Prior left carotid endarterectomy with residual mild 20% narrowing of the proximal left ICA. Left ICA otherwise widely patent distally without stenosis or acute vascular abnormality. Short-segment fenestration versus chronic dissection flap noted at the proximal  left external carotid artery (series 8, image 194). Vertebral arteries: Both vertebral arteries arise from the subclavian arteries. Left vertebral artery dominant and widely patent within the neck. Markedly attenuated flow within the right vertebral artery, most likely related to the previously identified high-grade proximal right subclavian artery stenosis. Right vertebral artery otherwise grossly patent without appreciable stenosis. Skeleton: No acute osseous abnormality. No discrete lytic or blastic osseous lesions. Degenerative spondylolysis noted at C5-6. Other neck: No acute soft tissue abnormality within the neck. No adenopathy. Heterogeneous left thyroid nodule measures up to approximately 3.2 cm. Upper chest: Partially visualized upper chest demonstrates no acute finding. Centrilobular and paraseptal emphysematous changes with superimposed atelectasis noted. Pleuroparenchymal thickening about the partially visualized left lung suspected to at least in part be postoperative related to prior lobectomy. Review of the MIP images confirms the above findings CTA HEAD FINDINGS Anterior circulation: Petrous segments widely patent. Scattered atheromatous plaque within the cavernous/supraclinoid ICAs with resultant mild to moderate diffuse narrowing bilaterally (no more than 50%). ICA termini well perfused. A1 segments widely patent. Normal anterior communicating artery. Anterior cerebral arteries widely patent to their distal aspects. No M1 stenosis or occlusion. Distal MCA branches well perfused and symmetric. Posterior circulation: Vertebral arteries widely patent to the vertebrobasilar junction without stenosis. Adequate opacification of the right V4 segment likely at least partially retrograde in nature across the vertebrobasilar junction. Posterior inferior cerebral arteries patent bilaterally. Basilar widely patent proximally, with mild narrowing at the basilar tip. Superior cerebral arteries patent  bilaterally. Probable duplicated right SCA noted. Both of the posterior cerebral arteries primarily supplied via the basilar. PCAs widely patent to their distal aspects. Small bilateral posterior communicating arteries noted. Venous sinuses: Patent. Anatomic variants: None significant. Delayed phase: No abnormal enhancement. Review of the MIP images confirms the above findings IMPRESSION: 1. Negative CTA for large vessel occlusion. 2. Severe atheromatous disease about the aortic arch and origin of the great vessels. Resultant stenoses of up to 65% at the right brachiocephalic artery, 98% at the proximal left common carotid artery, 60-70% at the origin of the right common carotid artery and 75% at the proximal right subclavian artery. Vascular stent in place at the origin of the left subclavian artery with widely patent flow. 3. Sequelae of prior bilateral CEAs without significant residual stenosis. Atheromatous stenosis of up to 70% at the distal right ICA. No other significant stenosis about the left ICA. 4. Relative sparing of the posterior circulation with wide patency of the vertebrobasilar system. Attenuated flow within the right vertebral artery likely related to the above-mentioned proximal right subclavian artery stenosis. 5. 3.2 cm left thyroid if not already performed. Dedicated nonemergent thyroid ultrasound recommended 6. Emphysema with postoperative changes within the partially visualized left lung. Electronically Signed   By: Jeannine Boga M.D.   On: 06/17/2018 04:34   Ct Head Wo Contrast  Result Date: 06/16/2018 CLINICAL DATA:  Focal neurologic deficits with confusion and weakness EXAM: CT HEAD WITHOUT CONTRAST TECHNIQUE: Contiguous axial images were obtained from the base of the skull through the vertex without intravenous contrast. COMPARISON:  10/20/2015 FINDINGS: Brain: Diffuse moderate to marked small vessel ischemic disease of periventricular  and subcortical white matter without acute  hemorrhage, intra-axial mass nor extra-axial fluid collections. No midline shift or edema. No large vascular territory infarct. Global atrophy is stable. Vascular: Atherosclerosis of the carotid siphons. No hyperdense vessel sign. Skull: Mild osseous demineralization skull base without acute fracture or bone destruction. Sinuses/Orbits: No acute finding. Other: None IMPRESSION: Atrophy with chronic, largely stable degree of moderate to marked small vessel ischemic disease of periventricular and subcortical white matter. No acute intracranial abnormality. Electronically Signed   By: Ashley Royalty M.D.   On: 06/16/2018 18:14   Ct Angio Neck W Or Wo Contrast  Result Date: 06/17/2018 CLINICAL DATA:  Follow-up examination for acute stroke. EXAM: CT ANGIOGRAPHY HEAD AND NECK TECHNIQUE: Multidetector CT imaging of the head and neck was performed using the standard protocol during bolus administration of intravenous contrast. Multiplanar CT image reconstructions and MIPs were obtained to evaluate the vascular anatomy. Carotid stenosis measurements (when applicable) are obtained utilizing NASCET criteria, using the distal internal carotid diameter as the denominator. CONTRAST:  42m ISOVUE-370 IOPAMIDOL (ISOVUE-370) INJECTION 76% COMPARISON:  Prior MRI from earlier same day. FINDINGS: CTA NECK FINDINGS Aortic arch: Visualized aortic arch of normal caliber with normal branch pattern. Heavy atheromatous plaque about the visualized arch and origin of the great vessels. Associated stenosis of up to 60% at the right brachiocephalic artery. Narrowing short-segment severe approximate 80% stenosis at the proximal left common carotid artery (series 8, image 297). Heavy atheromatous plaque at the origin of the left subclavian artery with left subclavian stent in place. Widely patent flow through the stent without significant intra stent stenosis. Additional approximate 50% stenosis at the mid left subclavian artery (series 8, image  267). Focal stenosis of up to approximate 75% at the origin of the right subclavian artery. Right carotid system: Atheromatous plaque at the origin of the right common carotid artery with associated stenosis of up to approximate 60-70% by NASCET criteria. Additional scattered plaque within the right CCA distally with scattered mild multifocal narrowing. Prior right carotid endarterectomy without significant residual stenosis. Right ICA widely patent at its proximal and mid aspect. Atheromatous plaque at the distal right ICA with associated stenosis of up to 70% by NASCET criteria (series 8, image 146). No acute vascular abnormality within the right carotid artery system. Left carotid system: Focal 80% stenosis at the proximal left common carotid artery again noted. Additional scattered plaque within the mid and distal left CCA with up to approximate 50% stenosis by NASCET criteria. Prior left carotid endarterectomy with residual mild 20% narrowing of the proximal left ICA. Left ICA otherwise widely patent distally without stenosis or acute vascular abnormality. Short-segment fenestration versus chronic dissection flap noted at the proximal left external carotid artery (series 8, image 194). Vertebral arteries: Both vertebral arteries arise from the subclavian arteries. Left vertebral artery dominant and widely patent within the neck. Markedly attenuated flow within the right vertebral artery, most likely related to the previously identified high-grade proximal right subclavian artery stenosis. Right vertebral artery otherwise grossly patent without appreciable stenosis. Skeleton: No acute osseous abnormality. No discrete lytic or blastic osseous lesions. Degenerative spondylolysis noted at C5-6. Other neck: No acute soft tissue abnormality within the neck. No adenopathy. Heterogeneous left thyroid nodule measures up to approximately 3.2 cm. Upper chest: Partially visualized upper chest demonstrates no acute finding.  Centrilobular and paraseptal emphysematous changes with superimposed atelectasis noted. Pleuroparenchymal thickening about the partially visualized left lung suspected to at least in part be postoperative related to prior lobectomy.  Review of the MIP images confirms the above findings CTA HEAD FINDINGS Anterior circulation: Petrous segments widely patent. Scattered atheromatous plaque within the cavernous/supraclinoid ICAs with resultant mild to moderate diffuse narrowing bilaterally (no more than 50%). ICA termini well perfused. A1 segments widely patent. Normal anterior communicating artery. Anterior cerebral arteries widely patent to their distal aspects. No M1 stenosis or occlusion. Distal MCA branches well perfused and symmetric. Posterior circulation: Vertebral arteries widely patent to the vertebrobasilar junction without stenosis. Adequate opacification of the right V4 segment likely at least partially retrograde in nature across the vertebrobasilar junction. Posterior inferior cerebral arteries patent bilaterally. Basilar widely patent proximally, with mild narrowing at the basilar tip. Superior cerebral arteries patent bilaterally. Probable duplicated right SCA noted. Both of the posterior cerebral arteries primarily supplied via the basilar. PCAs widely patent to their distal aspects. Small bilateral posterior communicating arteries noted. Venous sinuses: Patent. Anatomic variants: None significant. Delayed phase: No abnormal enhancement. Review of the MIP images confirms the above findings IMPRESSION: 1. Negative CTA for large vessel occlusion. 2. Severe atheromatous disease about the aortic arch and origin of the great vessels. Resultant stenoses of up to 65% at the right brachiocephalic artery, 29% at the proximal left common carotid artery, 60-70% at the origin of the right common carotid artery and 75% at the proximal right subclavian artery. Vascular stent in place at the origin of the left  subclavian artery with widely patent flow. 3. Sequelae of prior bilateral CEAs without significant residual stenosis. Atheromatous stenosis of up to 70% at the distal right ICA. No other significant stenosis about the left ICA. 4. Relative sparing of the posterior circulation with wide patency of the vertebrobasilar system. Attenuated flow within the right vertebral artery likely related to the above-mentioned proximal right subclavian artery stenosis. 5. 3.2 cm left thyroid if not already performed. Dedicated nonemergent thyroid ultrasound recommended 6. Emphysema with postoperative changes within the partially visualized left lung. Electronically Signed   By: Jeannine Boga M.D.   On: 06/17/2018 04:34   Mr Brain Wo Contrast  Result Date: 06/17/2018 CLINICAL DATA:  Initial evaluation for gradually progressive weakness for several days, ataxia. EXAM: MRI HEAD WITHOUT CONTRAST TECHNIQUE: Multiplanar, multiecho pulse sequences of the brain and surrounding structures were obtained without intravenous contrast. COMPARISON:  Prior CT from 06/16/2018 as well as previous MRI from 06/13/2016 FINDINGS: Brain: Examination moderately degraded by motion artifact. Mildly advanced age-related cerebral atrophy. Extensive confluent T2/FLAIR hyperintensity throughout the periventricular and deep white matter both cerebral hemispheres, with involvement of the pons, most likely related chronic microvascular ischemic disease. Few small superimposed remote lacunar infarcts within the right thalamus. Punctate 5 mm infarct involving the cortical gray matter of the right precentral gyrus (series 5, image 64). No associated hemorrhage or mass effect. No other evidence for acute or subacute ischemia. Gray-white matter differentiation otherwise maintained. No other areas of remote cortical infarction. No foci of susceptibility artifact to suggest acute or chronic intracranial hemorrhage. No mass lesion, midline shift or mass effect.  No hydrocephalus. No extra-axial fluid collection. Pituitary gland suprasellar region normal. Midline structures intact and normal. Vascular: Major intracranial vascular flow voids maintained at the skull base. Skull and upper cervical spine: Craniocervical junction normal. Upper cervical spine within normal limits. Bone marrow signal intensity normal. No scalp soft tissue abnormality. Sinuses/Orbits: Globes orbital soft tissues within normal limits. Paranasal sinuses are clear. Left-sided mastoid effusion with partial opacification of the left middle ear cavity, similar to previous, and of doubtful significance  in the acute setting. Trace fluid noted within the right mastoid air cells as well. Other: None. IMPRESSION: 1. 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 2. Underlying advanced cerebral white matter changes, most likely related to chronic microvascular ischemic disease, similar to previous. Electronically Signed   By: Jeannine Boga M.D.   On: 06/17/2018 01:38   US Thyroid  Result Date: 06/17/2018 CLINICAL DATA:  Nodule. Hyperthyroidism. Previous FNA biopsy of isthmic nodule 12/01/2015. EXAM: THYROID ULTRASOUND TECHNIQUE: Ultrasound examination of the thyroid gland and adjacent soft tissues was performed. COMPARISON:  11/24/2015 FINDINGS: Parenchymal Echotexture: Moderately heterogenous Isthmus: 0.9 cm thickness, previously 0.6 Right lobe: 6.4 x 3.3 x 2.7 cm, previously 6.7 x 2.7 x 2.5 Left lobe: 4.8 x 2.6 x 3 cm, previously 4.9 x 2.2 x 2.4 _________________________________________________________ Estimated total number of nodules >/= 1 cm: 6-10 Number of spongiform nodules >/=  2 cm not described below (TR1): 0 Number of mixed cystic and solid nodules >/= 1.5 cm not described below (Brandon): 0 _________________________________________________________ Nodule # 1: 3.1 x 1.9 x 2.7 cm left isthmic nodule, previously 2.9 x 1.5 x 2.7; this was  previously biopsied 0.9 cm benign colloid cyst, mid isthmus 1.3 cm benign colloid cyst, posterior mid right, previously 1.2 1.2 cm spongiform nodule, superior right, previously 1.3; This nodule does NOT meet TI-RADS criteria for biopsy or dedicated follow-up. Nodule # 5: Prior biopsy: No Location: Right; Inferior Maximum size: 1.1 cm; Other 2 dimensions: 0.8 x 0.9 cm, previously, 1.4 x 1.1 x 1.1 cm Composition: solid/almost completely solid (2) Echogenicity: isoechoic (1) Shape: not taller-than-wide (0) Margins: ill-defined (0) Echogenic foci: none (0) ACR TI-RADS total points: 3. ACR TI-RADS risk category:  TR3 (3 points). Significant change in size (>/= 20% in two dimensions and minimal increase of 2 mm): No Change in features: No Change in ACR TI-RADS risk category: No ACR TI-RADS recommendations: Given size (<1.4 cm) and appearance, this nodule does NOT meet TI-RADS criteria for biopsy or dedicated follow-up. _________________________________________________________ 0.9 cm complex cyst, superior right, previously 1 cm; This nodule does NOT meet TI-RADS criteria for biopsy or dedicated follow-up. 1.5 cm benign colloid cyst, superior left, previously 1.3 Nodule # 8: Prior biopsy: No Location: Left; Mid Maximum size: 1.4 cm; Other 2 dimensions: 1 x 1.1 cm, previously, 1.6 x 1 x 1.1 cm Composition: mixed cystic and solid (1) Echogenicity: hypoechoic (2) Shape: not taller-than-wide (0) Margins: smooth (0) Echogenic foci: none (0) ACR TI-RADS total points: 3. ACR TI-RADS risk category:  TR3 (3 points). Significant change in size (>/= 20% in two dimensions and minimal increase of 2 mm): No Change in features: No Change in ACR TI-RADS risk category: No ACR TI-RADS recommendations: Given size (<1.4 cm) and appearance, this nodule does NOT meet TI-RADS criteria for biopsy or dedicated follow-up. _________________________________________________________ Nodule # 9: Prior biopsy: No Location: Left; Inferior Maximum size:  1.5 cm; Other 2 dimensions: 0.8 x 1.1 cm, previously, 1.4 x 1.1 x 1.4 cm Composition: solid/almost completely solid (2) Echogenicity: hypoechoic (2) Shape: not taller-than-wide (0) Margins: smooth (0) Echogenic foci: large comet-tail artifacts (0) ACR TI-RADS total points: 4. ACR TI-RADS risk category:  TR4 (4-6 points). Significant change in size (>/= 20% in two dimensions and minimal increase of 2 mm): No Change in features: No Change in ACR TI-RADS risk category: No ACR TI-RADS recommendations: **Given size (>/= 1.5 cm) and appearance, fine needle aspiration of this moderately suspicious nodule should be considered based on  TI-RADS criteria. _________________________________________________________ Nodule # 10: Prior biopsy: No Location: Left; Inferior posterior Maximum size: 1.4 cm; Other 2 dimensions: 0.9 x 1.3 cm, previously, 1.5 x 0.8 x 1.1 cm Composition: solid/almost completely solid (2) Echogenicity: hypoechoic (2) Shape: not taller-than-wide (0) Margins: smooth (0) Echogenic foci: none (0) ACR TI-RADS total points: 4. ACR TI-RADS risk category:  TR4 (4-6 points). Significant change in size (>/= 20% in two dimensions and minimal increase of 2 mm): No Change in features: No Change in ACR TI-RADS risk category: No ACR TI-RADS recommendations: *Given size (>/= 1 - 1.4 cm) and appearance, a follow-up ultrasound in 1 year should be considered based on TI-RADS criteria. IMPRESSION: 1. Thyromegaly with multiple nodules. 2. Recommend FNA biopsy of moderately suspicious 1.5 cm inferior left nodule (#9). 3. Recommend annual/biennial ultrasound follow-up of additional nodules as above, until stability x5 years confirmed. The above is in keeping with the ACR TI-RADS recommendations - J Am Coll Radiol 2017;14:587-595. Electronically Signed   By: Lucrezia Europe M.D.   On: 06/17/2018 17:03   Vas US Carotid  Result Date: 06/17/2018 Carotid Arterial Duplex Study Indications: CVA. Limitations: breathing interference  Performing Technologist: June Leap RDMS, RVT  Examination Guidelines: A complete evaluation includes B-mode imaging, spectral Doppler, color Doppler, and power Doppler as needed of all accessible portions of each vessel. Bilateral testing is considered an integral part of a complete examination. Limited examinations for reoccurring indications may be performed as noted.  Right Carotid Findings: +----------+--------+--------+--------+------------+--------+           PSV cm/sEDV cm/sStenosisDescribe    Comments +----------+--------+--------+--------+------------+--------+ CCA Prox  47      7                                    +----------+--------+--------+--------+------------+--------+ CCA Distal38      13              heterogenous         +----------+--------+--------+--------+------------+--------+ ICA Prox  29      10      1-39%   heterogenous         +----------+--------+--------+--------+------------+--------+ ICA Distal32      12                                   +----------+--------+--------+--------+------------+--------+ ECA       27      7                                    +----------+--------+--------+--------+------------+--------+ +----------+--------+-------+-----------------+-------------------+           PSV cm/sEDV cmsDescribe         Arm Pressure (mmHG) +----------+--------+-------+-----------------+-------------------+ [id number]             atypical waveform                    +----------+--------+-------+-----------------+-------------------+ +---------+--------+--+--------+----------+ VertebralPSV cm/s16EDV cm/sRetrograde +---------+--------+--+--------+----------+ dampened monophasic flow throughout carotid  Left Carotid Findings: +----------+--------+--------+--------+-------------------------+--------+           PSV cm/sEDV cm/sStenosisDescribe                 Comments  +----------+--------+--------+--------+-------------------------+--------+ CCA Prox  62      13                                                +----------+--------+--------+--------+-------------------------+--------+  CCA Distal81      14              heterogenous and calcific         +----------+--------+--------+--------+-------------------------+--------+ ICA Prox  123     20              heterogenous                      +----------+--------+--------+--------+-------------------------+--------+ ICA Distal97      24                                                +----------+--------+--------+--------+-------------------------+--------+ ECA       56      4                                                 +----------+--------+--------+--------+-------------------------+--------+ +----------+--------+--------+-----------------+-------------------+ SubclavianPSV cm/sEDV cm/sDescribe         Arm Pressure (mmHG) +----------+--------+--------+-----------------+-------------------+           175             atypical waveform                    +----------+--------+--------+-----------------+-------------------+ +---------+--------+--+--------+--+---------+ VertebralPSV cm/s75EDV cm/s13Antegrade +---------+--------+--+--------+--+---------+ monophasic flow throughout carotid  Summary: Right Carotid: Velocities in the right ICA are consistent with a 1-39% stenosis. Left Carotid: Velocities in the left ICA are consistent with a 1-39% stenosis. Vertebrals:  Bilateral vertebral arteries demonstrate antegrade flow. Subclavians: Normal flow hemodynamics were seen in bilateral subclavian              arteries. *See table(s) above for measurements and observations.  Electronically signed by Antony Contras MD on 06/17/2018 at 3:20:50 PM.    Final    Vas [place] Lower Extremity Venous (dvt)  Result Date: 06/18/2018  Lower Venous Study Indications: Stroke.  Performing Technologist:  Maudry Mayhew MHA, RDMS, RVT, RDCS  Examination Guidelines: A complete evaluation includes B-mode imaging, spectral Doppler, color Doppler, and power Doppler as needed of all accessible portions of each vessel. Bilateral testing is considered an integral part of a complete examination. Limited examinations for reoccurring indications may be performed as noted.  Right Venous Findings: +---------+---------------+---------+-----------+----------+--------------+          CompressibilityPhasicitySpontaneityPropertiesSummary        +---------+---------------+---------+-----------+----------+--------------+ CFV      Full           No       Yes                  pulsatile flow +---------+---------------+---------+-----------+----------+--------------+ SFJ      Full                                                        +---------+---------------+---------+-----------+----------+--------------+ FV Prox  Full                                                        +---------+---------------+---------+-----------+----------+--------------+  FV Mid   Full                                                        +---------+---------------+---------+-----------+----------+--------------+ FV DistalFull                                                        +---------+---------------+---------+-----------+----------+--------------+ PFV      Full                                                        +---------+---------------+---------+-----------+----------+--------------+ POP      Full           No       Yes                  pulsatile flow +---------+---------------+---------+-----------+----------+--------------+ PTV      Full                                                        +---------+---------------+---------+-----------+----------+--------------+ PERO     Full                                                         +---------+---------------+---------+-----------+----------+--------------+  Left Venous Findings: +---------+---------------+---------+-----------+----------+--------------+          CompressibilityPhasicitySpontaneityPropertiesSummary        +---------+---------------+---------+-----------+----------+--------------+ CFV      Full           No       Yes                  pulsatile flow +---------+---------------+---------+-----------+----------+--------------+ SFJ      Full                                                        +---------+---------------+---------+-----------+----------+--------------+ FV Prox  Full                                                        +---------+---------------+---------+-----------+----------+--------------+ FV Mid   Full                                                        +---------+---------------+---------+-----------+----------+--------------+  FV DistalFull                                                        +---------+---------------+---------+-----------+----------+--------------+ PFV      Full                                                        +---------+---------------+---------+-----------+----------+--------------+ POP      Full           No       Yes                  pulsatile flow +---------+---------------+---------+-----------+----------+--------------+ PTV      Full                                                        +---------+---------------+---------+-----------+----------+--------------+ PERO     Full                                                        +---------+---------------+---------+-----------+----------+--------------+    Summary: Right: There is no evidence of deep vein thrombosis in the lower extremity. No cystic structure found in the popliteal fossa. Left: There is no evidence of deep vein thrombosis in the lower extremity. No cystic structure found in the  popliteal fossa.  Pulsatile venous flow is suggestive of elevated right sided heart pressure. *See table(s) above for measurements and observations. Electronically signed by Curt Jews MD on 06/18/2018 at 2:36:14 PM.    Final    2D Echocardiogram   1. The left ventricle has normal systolic function with an ejection fraction of 60-65%. The cavity size was normal. Moderate septal hypertrophy. Left ventricular diastolic Doppler parameters are consistent with impaired relaxation.  2. The right ventricle has mildly reduced systolic function. The cavity was mildly enlarged. There is no increase in right ventricular wall thickness. Right ventricular systolic pressure is severely elevated with an estimated pressure of 69.9 mmHg.  3. Left atrial size was moderately dilated.  4. The mitral valve is normal in structure.  5. The tricuspid valve is normal in structure.  6. The aortic valve is normal in structure.  7. The pulmonic valve was normal in structure.  8. There is mild dilatation of the ascending aorta. I think what ever it is of Christanna me is the same thing  PHYSICAL EXAM Pleasant elderly Caucasian lady not in distress. . Afebrile. Head is nontraumatic. Neck is supple without bruit.    Cardiac exam no murmur or gallop. Lungs are clear to auscultation. Distal pulses are well felt. Neurological Exam ;  Awake  Alert oriented x 3. Normal speech and language.eye movements full without nystagmus.fundi were not visualized. Vision acuity and fields appear normal. Hearing is normal. Palatal movements are normal. Face symmetric. Tongue midline. Normal strength, tone, reflexes and coordination. Normal  sensation. Gait deferred.   ASSESSMENT/PLAN [name] is a 78 y.o. female with history of previous stroke, diabetes, hypertension, hypercholesterolemia, carotid artery disease presenting with slurred speech for several days and increased difficulty walking who developed a syncopal event with no  postictal state..   Stroke: Punctate right precentral gyrus cortical infarct, incidental finding, felt to be secondary to small vessel disease  CT head Small vessel disease. Atrophy.   MRI punctate nonhemorrhagic cortical infarct involving the right precentral gyrus.  Small vessel disease.  CTA head & neck no LVO.  Severe atherosclerosis (R brachiocephalic 62%, L CCA 94%, R CCA 60 to 70%, proximal R ICA 75%) vascular stent at origin subclavian artery.  Significant residual stenosis as sequela of prior bilateral right 3.2 cm left thyroid nodule.  Emphysema.  Carotid Doppler  B ICA 1-39% stenosis, VAs antegrade   2D Echo  EF 60-65 year stuck in here with %. No source of embolus   LDL 44  HgbA1c 7.9  Lovenox 40 mg sq daily for VTE prophylaxis  aspirin 81 mg daily prior to admission, now on aspirin 325 mg daily and clopidogrel 75 mg daily following load. Given large vessel intracranial atherosclerosis, patient should be treated with aspirin 81 mg and clopidogrel 75 mg orally every day x 3 months for secondary stroke prevention. After 3 months, change to plavix alone. Long-term dual antiplatelets are contraindicated due to risk for intracerebral hemorrhage.   Therapy recommendations:   SNF has had  Disposition:  pending   Continue DAPT. Stroke team will sign off. No further neurological follow up needed.   Hypertension  Stable . BP goal normotensive  Hyperlipidemia  Home meds: Pravachol 40 and fish oil, on Pravachol 40 and Lovaza in hospital  LDL 44, goal < 70  Continue statin and fish oil at discharge  Diabetes type II  HgbA1c 7.9, goal < 7.0  Uncontrolled  Other Stroke Risk Factors  Advanced age  Former cigarette smoker, quit 4 years ago   Former smokeless tobacco user, quit 5 years ago  Obesity, Body mass index is 33.04 kg/m., recommend weight loss, diet and exercise as appropriate   Family hx stroke (mother)  Diastolic CHF  Carotid artery  disease  Peripheral artery disease  Other Active Problems  Severe pulmonary HTN  Thyroid nodule  Hx lung cancer s/p lobectomy  Syncope due to dehydration and deconditioning  ARF d/t dehydration  Hospital day # 1  The patient presented with a syncopal event and MRI shows tiny punctate right precentral gyrus infarct likely secondary to hypotension and CT angiograms suggest moderate right common carotid stenosis which needs further evaluation with carotid ultrasound and if stenosis is confirmed she may need to repeat carotid endarterectomy or stenting in vascular surgery consult. Continue antiplatelet therapy for now   aggressive risk factor modification.  Stroke team will sign off. Kindly call for questions. Discussed with Dr. Candiss Norse. Antony Contras, MD Medical Director [place] Pager: [phone] 06/18/2018 5:37 PM  To contact Stroke Continuity provider, please refer to [email or web address] After hours, contact General Neurology

## 2018-06-18 NOTE — Progress Notes (Signed)
OT Cancellation Note  Patient Details Name: Caitlin Oliver MRN: [id number] DOB: 01-12-1941   Cancelled Treatment:    Reason Eval/Treat Not Completed: Patient at procedure or test/ unavailable(CT)  Ramond Dial, OT/L   Acute OT Clinical Specialist Acute Rehabilitation Services Pager [phone] Office [phone]  06/18/2018, 1:45 PM

## 2018-06-18 NOTE — Progress Notes (Signed)
CSW spoke with patient to let her know that [facility] would be able to extend a bed offer. CSW requested permission to have her insurance begin insurance authorization. Patient stated that she is still not sure that she wants to go to SNF. CSW encouraged her to strongly consider it as rehab is important after having a stroke. Patient stated she was having trouble contacting her brother. CSW provided her with his phone number.   Percell Locus Lennie Dunnigan LCSW [phone]

## 2018-06-18 NOTE — Progress Notes (Signed)
Bilateral lower extremity venous duplex completed. Refer to "CV Proc" under chart review to view preliminary results.  06/18/2018 11:35 AM Maudry Mayhew, MHA, RVT, RDCS, RDMS

## 2018-06-18 NOTE — Plan of Care (Signed)
  Problem: Education: Goal: Knowledge of disease or condition will improve Outcome: Progressing   Problem: Education: Goal: Knowledge of patient specific risk factors addressed and post discharge goals established will improve Outcome: Progressing   Problem: Coping: Goal: Will identify appropriate support needs Outcome: Progressing   Problem: Coping: Goal: Will verbalize positive feelings about self Outcome: Progressing   Problem: Education: Goal: Individualized Educational Video(s) Outcome: Progressing

## 2018-06-18 NOTE — Progress Notes (Signed)
Peripherally Inserted Central Catheter/Midline Placement  Received PIV consult for CT scan. Pt had been stuck in [facility] several times while in radiology. Deep veins noted above previous attempts, midline selected.   The IV Nurse has discussed with the patient and/or persons authorized to consent for the patient, the purpose of this procedure and the potential benefits and risks involved with this procedure.  The benefits include less needle sticks, lab draws from the catheter, and the patient may be discharged home with the catheter. Risks include, but not limited to, infection, bleeding, blood clot (thrombus formation), and puncture of an artery; nerve damage and irregular heartbeat and possibility to perform a PICC exchange if needed/ordered by physician.  Alternatives to this procedure were also discussed.  B[employer] Power PICC patient education guide, fact sheet on infection prevention and patient information card has been provided to patient /or left at bedside.    PICC/Midline Placement Documentation   See IV flow sheet.      Rosalio Macadamia Chenice 06/18/2018, 4:19 PM

## 2018-06-19 LAB — GLUCOSE, CAPILLARY
GLUCOSE-CAPILLARY: 165 mg/dL — AB (ref 70–99)
Glucose-Capillary: 113 mg/dL — ABNORMAL HIGH (ref 70–99)
Glucose-Capillary: 125 mg/dL — ABNORMAL HIGH (ref 70–99)
Glucose-Capillary: 185 mg/dL — ABNORMAL HIGH (ref 70–99)

## 2018-06-19 MED ORDER — HYDRALAZINE HCL 50 MG PO TABS
50.0000 mg | ORAL_TABLET | Freq: Three times a day (TID) | ORAL | Status: DC
  Administered 2018-06-19 (×3): 50 mg via ORAL
  Filled 2018-06-19 (×4): qty 1

## 2018-06-19 MED ORDER — MUSCLE RUB 10-15 % EX CREA
1.0000 "application " | TOPICAL_CREAM | CUTANEOUS | Status: DC | PRN
  Administered 2018-06-19: 1 via TOPICAL
  Filled 2018-06-19: qty 85

## 2018-06-19 MED ORDER — METOPROLOL TARTRATE 50 MG PO TABS
50.0000 mg | ORAL_TABLET | Freq: Two times a day (BID) | ORAL | Status: DC
  Administered 2018-06-19 – 2018-06-20 (×3): 50 mg via ORAL
  Filled 2018-06-19 (×3): qty 1

## 2018-06-19 NOTE — Progress Notes (Signed)
PROGRESS NOTE                                                                                                                                                                                                             Patient Demographics:    Caitlin Oliver, is a 78 y.o. female, DOB - 1941/01/10, KMM:[id number]  Admit date - 06/16/2018   Admitting Physician Rise Patience, MD  Outpatient Primary MD for the patient is [name], PA-C  LOS - 2  Chief Complaint  Patient presents with  . Altered Mental Status       Brief Narrative  Caitlin Oliver is a 78 y.o. female with history of hypertension, hyperlipidemia, peripheral vascular disease status post bilateral carotid endarterectomy and lower extremity stent placement, depression, diabetes mellitus type 2 was brought to the ER after patient had a brief episode of syncope while walking to go to the PCPs office.  Over the last 3 days patient has been finding increasingly difficult to walk with ataxia and staggering towards the left side. In the ER she was diagnosed with CVA.   Subjective:   Patient in bed, appears comfortable, denies any headache, no fever, no chest pain or pressure, no shortness of breath , no abdominal pain. No focal weakness.   Assessment  & Plan :     1.  Right precentral gyrus 5 mm acute ischemic stroke.  Symptoms have resolved, A1c elevated, LDL at goal, seen by neuro, case discussed with Dr. Leonie Man, echo stable, currently on dual antiplatelet therapy along with statin which will be continued.  PT OT and speech evaluation.  Patient overall quite weak and deconditioned per PT SNF.  Patient not safe for home discharge per PT, await SNF Bed.  2. Dyslipidemia.  LDL uncontrolled continue home dose statin.  3.  Newly diagnosed chronic severe pulmonary hypertension.  Diagnosed on echocardiogram this admission.  She likely has undiagnosed obstructive sleep apnea, recommend outpatient sleep study, work-up  here for severe pulmonary hypertension was unremarkable including CTA, lower extremity venous duplex, d-dimer was borderline positive.  Will request patient to have outpatient pulmonary follow-up and sleep study.  Currently compensated.  Oxygen as needed if qualifies.  4.  Chronic diastolic CHF with EF 79%.  Currently compensated.  5.  Thyroid nodule noted on CTA.  Stable thyroid enzymes including stable TSH free T4 and T3, thyroid ultrasound does show a suspicious nodule.  Will defer to PCP, she will eventually require needle aspiration and  biopsy.  6.  Hypertension.  On Cardizem and metoprolol with poor control, switch metoprolol to Coreg and add hydralazine, monitor blood pressure IV hydralazine also ordered PRN.  7. PAD and history of carotid artery disease.  Has had carotid surgery in the past with Dr. Trula Slade, currently on dual antiplatelet therapy and statin, CT angiogram had nonspecific changes for which carotid venous duplex was done on the recommendation of Dr. Leonie Man, this is unremarkable we will have patient follow with her vascular surgeon post discharge.  8.  HX of lung cancer status post lobectomy.  No acute issues.  9.  Syncope due to dehydration deconditioning.  IV fluids, monitor on telemetry, echo noted, PT, SNF.  Continue to monitor orthostatics.  10.  ARF due to dehydration.  Resolved after IV fluids.  11.  DM type II.  Poor control due to hyperglycemia.  DM and insulin education, Lantus and sliding scale here.  Hold Glucophage.  Lab Results  Component Value Date   HGBA1C 7.9 (H) 06/17/2018   CBG (last 3)  Recent Labs    06/18/18 1611 06/18/18 2135 06/19/18 0833  GLUCAP 120* 113* 113*     Family Communication  :  None  Code Status :  Full  Disposition Plan  :  SNF  Consults  :  Neuro  Procedures  :    CTA  - 1. Negative CTA for large vessel occlusion. 2. Severe atheromatous disease about the aortic arch and origin of the great vessels. Resultant stenoses  of up to 65% at the right brachiocephalic artery, 81% at the proximal left common carotid artery, 60-70% at the origin of the right common carotid artery and 75% at the proximal right subclavian artery. Vascular stent in place at the origin of the left subclavian artery with widely patent flow. 3. Sequelae of prior bilateral CEAs without significant residual stenosis. Atheromatous stenosis of up to 70% at the distal right ICA. No other significant stenosis about the left ICA. 4. Relative sparing of the posterior circulation with wide patency of the vertebrobasilar system. Attenuated flow within the right vertebral artery likely related to the above-mentioned proximal right subclavian artery stenosis. 5. 3.2 cm left thyroid if not already performed. Dedicated nonemergent thyroid ultrasound recommended 6. Emphysema with postoperative changes within the partially visualized left lung.  Leg [place] - bilateral lower extremity ultrasound negative for DVT.  MRI - 1. 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 2. Underlying advanced cerebral white matter changes, most likely related to chronic microvascular ischemic disease, similar to previous.   TTE -    1. The left ventricle has normal systolic function with an ejection fraction of 60-65%. The cavity size was normal. Moderate septal hypertrophy. Left ventricular diastolic Doppler parameters are consistent with impaired relaxation.  2. The right ventricle has mildly reduced systolic function. The cavity was mildly enlarged. There is no increase in right ventricular wall thickness. Right ventricular systolic pressure is severely elevated with an estimated pressure of 69.9 mmHg.  3. Left atrial size was moderately dilated.  4. The mitral valve is normal in structure.  5. The tricuspid valve is normal in structure.  6. The aortic valve is normal in structure.  7. The pulmonic valve was normal in structure.   8. There is mild dilatation of the ascending aorta.   Carotid US -  Right Carotid: Velocities in the right ICA are consistent with a 1-39% stenosis.  Left Carotid: Velocities in the left ICA  are consistent with a 1-39% stenosis.   Thyroid US -   1. Thyromegaly with multiple nodules. 2. Recommend FNA biopsy of moderately suspicious 1.5 cm inferior left nodule (#9). 3. Recommend annual/biennial ultrasound follow-up of additional nodules as above, until stability x5 years confirmed.   DVT Prophylaxis  :  Lovenox    Lab Results  Component Value Date   PLT 245 06/17/2018    Diet :  Diet Order            Diet heart healthy/carb modified Room service appropriate? Yes; Fluid consistency: Thin  Diet effective now               Inpatient Medications Scheduled Meds: . aspirin EC  81 mg Oral Daily  . clopidogrel  75 mg Oral Daily  . diltiazem  180 mg Oral Daily  . DULoxetine  60 mg Oral QHS  . enoxaparin (LOVENOX) injection  40 mg Subcutaneous Daily  . hydrALAZINE  50 mg Oral Q8H  . insulin aspart  0-9 Units Subcutaneous TID WC  . insulin starter kit- pen needles  1 kit Other Once  . metoprolol tartrate  50 mg Oral BID WC  . omega-3 acid ethyl esters  1 g Oral Daily  . pantoprazole  40 mg Oral BID  . pravastatin  40 mg Oral QAC supper  . sodium chloride flush  10-40 mL Intracatheter Q12H   Continuous Infusions:  PRN Meds:.acetaminophen **OR** [DISCONTINUED] acetaminophen, albuterol, cyclobenzaprine, hydrALAZINE, HYDROcodone-acetaminophen, [DISCONTINUED] ondansetron **OR** ondansetron (ZOFRAN) IV, sodium chloride flush  Antibiotics  :   Anti-infectives (From admission, onward)   None          Objective:   Vitals:   06/18/18 2134 06/19/18 0446 06/19/18 0530 06/19/18 0848  BP: (!) 134/96 (!) 161/113  (!) 189/117  Pulse: 77 (!) 102  96  Resp: 18 15    Temp:  98.1 F (36.7 C)  98 F (36.7 C)  TempSrc:  Oral  Oral  SpO2: 98% 99%  99%  Weight:   88.2 kg      Wt Readings from Last 3 Encounters:  06/19/18 88.2 kg  03/10/18 84 kg  09/23/17 85.5 kg    No intake or output data in the 24 hours ending 06/19/18 0852   Physical Exam  Awake Alert, Oriented X 3, No new F.N deficits, Normal affect [place].AT,PERRAL Supple Neck,No JVD, No cervical lymphadenopathy appriciated.  Symmetrical Chest wall movement, Good air movement bilaterally, CTAB RRR,No Gallops, Rubs or new Murmurs, No Parasternal Heave +ve B.Sounds, Abd Soft, No tenderness, No organomegaly appriciated, No rebound - guarding or rigidity. No Cyanosis, Clubbing or edema, No new Rash or bruise    Data Review:    CBC Recent Labs  Lab 06/16/18 1743 06/16/18 2314 06/17/18 0430  WBC 9.9 9.4 7.3  HGB 13.3 13.7 13.2  HCT 43.4 43.9 43.2  PLT 300 124* 245  MCV 91.4 92.4 90.6  MCH 28.0 28.8 27.7  MCHC 30.6 31.2 30.6  RDW 13.6 13.8 13.7  LYMPHSABS 1.4  --  1.0  MONOABS 0.7  --  0.6  EOSABS 0.1  --  0.1  BASOSABS 0.1  --  0.0    Chemistries  Recent Labs  Lab 06/16/18 1743 06/16/18 1759 06/16/18 2314 06/17/18 0430 06/18/18 0352  NA 132*  --   --  133* 134*  K 5.1  --   --  4.7 4.5  CL 96*  --   --  94* 97*  CO2 23  --   --  26 28  GLUCOSE 131*  --   --  117* 173*  BUN 30*  --   --  24* 21  CREATININE 1.32* 1.00 1.01* 0.88 0.98  CALCIUM 9.4  --   --  9.0 8.4*  MG  --   --   --   --  1.9  AST 51*  --   --  48*  --   ALT 64*  --   --  63*  --   ALKPHOS 70  --   --  67  --   BILITOT 0.7  --   --  0.9  --    ------------------------------------------------------------------------------------------------------------------ Recent Labs    06/17/18 0430  CHOL 99  HDL 31*  LDLCALC 44  TRIG 122  CHOLHDL 3.2    Lab Results  Component Value Date   HGBA1C 7.9 (H) 06/17/2018   ------------------------------------------------------------------------------------------------------------------ Recent Labs    06/17/18 0430  TSH 1.620    ------------------------------------------------------------------------------------------------------------------ No results for input(s): VITAMINB12, FOLATE, FERRITIN, TIBC, IRON, RETICCTPCT in the last 72 hours.  Coagulation profile Recent Labs  Lab 06/16/18 1743  INR 1.1    Recent Labs    06/16/18 2314 06/17/18 1707  DDIMER 0.85* 0.86*    Cardiac Enzymes Recent Labs  Lab 06/16/18 2314 06/17/18 0430 06/17/18 1052  TROPONINI <0.03 <0.03 <0.03   ------------------------------------------------------------------------------------------------------------------ No results found for: BNP  Micro Results No results found for this or any previous visit (from the past 240 hour(s)).  Radiology Reports Ct Angio Head W Or Wo Contrast  Result Date: 06/17/2018 CLINICAL DATA:  Follow-up examination for acute stroke. EXAM: CT ANGIOGRAPHY HEAD AND NECK TECHNIQUE: Multidetector CT imaging of the head and neck was performed using the standard protocol during bolus administration of intravenous contrast. Multiplanar CT image reconstructions and MIPs were obtained to evaluate the vascular anatomy. Carotid stenosis measurements (when applicable) are obtained utilizing NASCET criteria, using the distal internal carotid diameter as the denominator. CONTRAST:  58m ISOVUE-370 IOPAMIDOL (ISOVUE-370) INJECTION 76% COMPARISON:  Prior MRI from earlier same day. FINDINGS: CTA NECK FINDINGS Aortic arch: Visualized aortic arch of normal caliber with normal branch pattern. Heavy atheromatous plaque about the visualized arch and origin of the great vessels. Associated stenosis of up to 60% at the right brachiocephalic artery. Narrowing short-segment severe approximate 80% stenosis at the proximal left common carotid artery (series 8, image 297). Heavy atheromatous plaque at the origin of the left subclavian artery with left subclavian stent in place. Widely patent flow through the stent without significant  intra stent stenosis. Additional approximate 50% stenosis at the mid left subclavian artery (series 8, image 267). Focal stenosis of up to approximate 75% at the origin of the right subclavian artery. Right carotid system: Atheromatous plaque at the origin of the right common carotid artery with associated stenosis of up to approximate 60-70% by NASCET criteria. Additional scattered plaque within the right CCA distally with scattered mild multifocal narrowing. Prior right carotid endarterectomy without significant residual stenosis. Right ICA widely patent at its proximal and mid aspect. Atheromatous plaque at the distal right ICA with associated stenosis of up to 70% by NASCET criteria (series 8, image 146). No acute vascular abnormality within the right carotid artery system. Left carotid system: Focal 80% stenosis at the proximal left common carotid artery again noted. Additional scattered plaque within the mid and distal left CCA with up to approximate 50% stenosis by NASCET criteria. Prior left carotid endarterectomy with residual mild 20% narrowing of the proximal left  ICA. Left ICA otherwise widely patent distally without stenosis or acute vascular abnormality. Short-segment fenestration versus chronic dissection flap noted at the proximal left external carotid artery (series 8, image 194). Vertebral arteries: Both vertebral arteries arise from the subclavian arteries. Left vertebral artery dominant and widely patent within the neck. Markedly attenuated flow within the right vertebral artery, most likely related to the previously identified high-grade proximal right subclavian artery stenosis. Right vertebral artery otherwise grossly patent without appreciable stenosis. Skeleton: No acute osseous abnormality. No discrete lytic or blastic osseous lesions. Degenerative spondylolysis noted at C5-6. Other neck: No acute soft tissue abnormality within the neck. No adenopathy. Heterogeneous left thyroid nodule  measures up to approximately 3.2 cm. Upper chest: Partially visualized upper chest demonstrates no acute finding. Centrilobular and paraseptal emphysematous changes with superimposed atelectasis noted. Pleuroparenchymal thickening about the partially visualized left lung suspected to at least in part be postoperative related to prior lobectomy. Review of the MIP images confirms the above findings CTA HEAD FINDINGS Anterior circulation: Petrous segments widely patent. Scattered atheromatous plaque within the cavernous/supraclinoid ICAs with resultant mild to moderate diffuse narrowing bilaterally (no more than 50%). ICA termini well perfused. A1 segments widely patent. Normal anterior communicating artery. Anterior cerebral arteries widely patent to their distal aspects. No M1 stenosis or occlusion. Distal MCA branches well perfused and symmetric. Posterior circulation: Vertebral arteries widely patent to the vertebrobasilar junction without stenosis. Adequate opacification of the right V4 segment likely at least partially retrograde in nature across the vertebrobasilar junction. Posterior inferior cerebral arteries patent bilaterally. Basilar widely patent proximally, with mild narrowing at the basilar tip. Superior cerebral arteries patent bilaterally. Probable duplicated right SCA noted. Both of the posterior cerebral arteries primarily supplied via the basilar. PCAs widely patent to their distal aspects. Small bilateral posterior communicating arteries noted. Venous sinuses: Patent. Anatomic variants: None significant. Delayed phase: No abnormal enhancement. Review of the MIP images confirms the above findings IMPRESSION: 1. Negative CTA for large vessel occlusion. 2. Severe atheromatous disease about the aortic arch and origin of the great vessels. Resultant stenoses of up to 65% at the right brachiocephalic artery, 51% at the proximal left common carotid artery, 60-70% at the origin of the right common carotid  artery and 75% at the proximal right subclavian artery. Vascular stent in place at the origin of the left subclavian artery with widely patent flow. 3. Sequelae of prior bilateral CEAs without significant residual stenosis. Atheromatous stenosis of up to 70% at the distal right ICA. No other significant stenosis about the left ICA. 4. Relative sparing of the posterior circulation with wide patency of the vertebrobasilar system. Attenuated flow within the right vertebral artery likely related to the above-mentioned proximal right subclavian artery stenosis. 5. 3.2 cm left thyroid if not already performed. Dedicated nonemergent thyroid ultrasound recommended 6. Emphysema with postoperative changes within the partially visualized left lung. Electronically Signed   By: Jeannine Boga M.D.   On: 06/17/2018 04:34   Ct Head Wo Contrast  Result Date: 06/16/2018 CLINICAL DATA:  Focal neurologic deficits with confusion and weakness EXAM: CT HEAD WITHOUT CONTRAST TECHNIQUE: Contiguous axial images were obtained from the base of the skull through the vertex without intravenous contrast. COMPARISON:  10/20/2015 FINDINGS: Brain: Diffuse moderate to marked small vessel ischemic disease of periventricular and subcortical white matter without acute hemorrhage, intra-axial mass nor extra-axial fluid collections. No midline shift or edema. No large vascular territory infarct. Global atrophy is stable. Vascular: Atherosclerosis of the carotid siphons. No hyperdense  vessel sign. Skull: Mild osseous demineralization skull base without acute fracture or bone destruction. Sinuses/Orbits: No acute finding. Other: None IMPRESSION: Atrophy with chronic, largely stable degree of moderate to marked small vessel ischemic disease of periventricular and subcortical white matter. No acute intracranial abnormality. Electronically Signed   By: Ashley Royalty M.D.   On: 06/16/2018 18:14   Ct Angio Neck W Or Wo Contrast  Result Date:  06/17/2018 CLINICAL DATA:  Follow-up examination for acute stroke. EXAM: CT ANGIOGRAPHY HEAD AND NECK TECHNIQUE: Multidetector CT imaging of the head and neck was performed using the standard protocol during bolus administration of intravenous contrast. Multiplanar CT image reconstructions and MIPs were obtained to evaluate the vascular anatomy. Carotid stenosis measurements (when applicable) are obtained utilizing NASCET criteria, using the distal internal carotid diameter as the denominator. CONTRAST:  13m ISOVUE-370 IOPAMIDOL (ISOVUE-370) INJECTION 76% COMPARISON:  Prior MRI from earlier same day. FINDINGS: CTA NECK FINDINGS Aortic arch: Visualized aortic arch of normal caliber with normal branch pattern. Heavy atheromatous plaque about the visualized arch and origin of the great vessels. Associated stenosis of up to 60% at the right brachiocephalic artery. Narrowing short-segment severe approximate 80% stenosis at the proximal left common carotid artery (series 8, image 297). Heavy atheromatous plaque at the origin of the left subclavian artery with left subclavian stent in place. Widely patent flow through the stent without significant intra stent stenosis. Additional approximate 50% stenosis at the mid left subclavian artery (series 8, image 267). Focal stenosis of up to approximate 75% at the origin of the right subclavian artery. Right carotid system: Atheromatous plaque at the origin of the right common carotid artery with associated stenosis of up to approximate 60-70% by NASCET criteria. Additional scattered plaque within the right CCA distally with scattered mild multifocal narrowing. Prior right carotid endarterectomy without significant residual stenosis. Right ICA widely patent at its proximal and mid aspect. Atheromatous plaque at the distal right ICA with associated stenosis of up to 70% by NASCET criteria (series 8, image 146). No acute vascular abnormality within the right carotid artery system.  Left carotid system: Focal 80% stenosis at the proximal left common carotid artery again noted. Additional scattered plaque within the mid and distal left CCA with up to approximate 50% stenosis by NASCET criteria. Prior left carotid endarterectomy with residual mild 20% narrowing of the proximal left ICA. Left ICA otherwise widely patent distally without stenosis or acute vascular abnormality. Short-segment fenestration versus chronic dissection flap noted at the proximal left external carotid artery (series 8, image 194). Vertebral arteries: Both vertebral arteries arise from the subclavian arteries. Left vertebral artery dominant and widely patent within the neck. Markedly attenuated flow within the right vertebral artery, most likely related to the previously identified high-grade proximal right subclavian artery stenosis. Right vertebral artery otherwise grossly patent without appreciable stenosis. Skeleton: No acute osseous abnormality. No discrete lytic or blastic osseous lesions. Degenerative spondylolysis noted at C5-6. Other neck: No acute soft tissue abnormality within the neck. No adenopathy. Heterogeneous left thyroid nodule measures up to approximately 3.2 cm. Upper chest: Partially visualized upper chest demonstrates no acute finding. Centrilobular and paraseptal emphysematous changes with superimposed atelectasis noted. Pleuroparenchymal thickening about the partially visualized left lung suspected to at least in part be postoperative related to prior lobectomy. Review of the MIP images confirms the above findings CTA HEAD FINDINGS Anterior circulation: Petrous segments widely patent. Scattered atheromatous plaque within the cavernous/supraclinoid ICAs with resultant mild to moderate diffuse narrowing bilaterally (no more than  50%). ICA termini well perfused. A1 segments widely patent. Normal anterior communicating artery. Anterior cerebral arteries widely patent to their distal aspects. No M1  stenosis or occlusion. Distal MCA branches well perfused and symmetric. Posterior circulation: Vertebral arteries widely patent to the vertebrobasilar junction without stenosis. Adequate opacification of the right V4 segment likely at least partially retrograde in nature across the vertebrobasilar junction. Posterior inferior cerebral arteries patent bilaterally. Basilar widely patent proximally, with mild narrowing at the basilar tip. Superior cerebral arteries patent bilaterally. Probable duplicated right SCA noted. Both of the posterior cerebral arteries primarily supplied via the basilar. PCAs widely patent to their distal aspects. Small bilateral posterior communicating arteries noted. Venous sinuses: Patent. Anatomic variants: None significant. Delayed phase: No abnormal enhancement. Review of the MIP images confirms the above findings IMPRESSION: 1. Negative CTA for large vessel occlusion. 2. Severe atheromatous disease about the aortic arch and origin of the great vessels. Resultant stenoses of up to 65% at the right brachiocephalic artery, 19% at the proximal left common carotid artery, 60-70% at the origin of the right common carotid artery and 75% at the proximal right subclavian artery. Vascular stent in place at the origin of the left subclavian artery with widely patent flow. 3. Sequelae of prior bilateral CEAs without significant residual stenosis. Atheromatous stenosis of up to 70% at the distal right ICA. No other significant stenosis about the left ICA. 4. Relative sparing of the posterior circulation with wide patency of the vertebrobasilar system. Attenuated flow within the right vertebral artery likely related to the above-mentioned proximal right subclavian artery stenosis. 5. 3.2 cm left thyroid if not already performed. Dedicated nonemergent thyroid ultrasound recommended 6. Emphysema with postoperative changes within the partially visualized left lung. Electronically Signed   By: Jeannine Boga M.D.   On: 06/17/2018 04:34   Ct Angio Chest Pe W Or Wo Contrast  Result Date: 06/18/2018 CLINICAL DATA:  78 year old female with history of shortness of breath. EXAM: CT ANGIOGRAPHY CHEST WITH CONTRAST TECHNIQUE: Multidetector CT imaging of the chest was performed using the standard protocol during bolus administration of intravenous contrast. Multiplanar CT image reconstructions and MIPs were obtained to evaluate the vascular anatomy. CONTRAST:  34m OMNIPAQUE IOHEXOL 350 MG/ML SOLN COMPARISON:  Chest CT 03/10/2018. FINDINGS: Cardiovascular: No filling defects within the pulmonary arterial tree to suggest underlying pulmonary embolism. Heart size is mildly enlarged with right atrial and right ventricular dilatation. There is no significant pericardial fluid, thickening or pericardial calcification. There is aortic atherosclerosis, as well as atherosclerosis of the great vessels of the mediastinum and the coronary arteries, including calcified atherosclerotic plaque in the left main, left anterior descending, left circumflex and right coronary arteries. Dilatation of the pulmonic trunk (3.6 cm in diameter). Mediastinum/Nodes: No pathologically enlarged mediastinal or hilar lymph nodes. Esophagus is unremarkable in appearance. No axillary lymphadenopathy. Lungs/Pleura: Status post left upper lobectomy. No acute consolidative airspace disease. Enlarging small left and trace right pleural effusions. Previously noted small nodule in the superior segment of the right lower lobe unchanged, measuring 7 x 5 mm (mean diameter of 6 mm). Focal area of architectural distortion in the lateral segment of the right middle lobe, favored to reflect an area of atelectasis. Mild scarring and/or atelectasis in the base of the left lower lobe. Upper Abdomen: Diffuse low attenuation throughout the visualized hepatic parenchyma, indicative of hepatic steatosis. Aortic atherosclerosis. Musculoskeletal: There are no  aggressive appearing lytic or blastic lesions noted in the visualized portions of the skeleton. Review of  the MIP images confirms the above findings. IMPRESSION: 1. No evidence of pulmonary embolism. 2. Enlarging bilateral pleural effusions, small on the left and trace on the right. 3. Cardiomegaly with right atrial and right ventricular dilatation, as well as dilatation of the pulmonic trunk (3.6 cm in diameter). Findings suggest pulmonary arterial hypertension. 4. Aortic atherosclerosis, in addition to left main and 3 vessel coronary artery disease. Assessment for potential risk factor modification, dietary therapy or pharmacologic therapy may be warranted, if clinically indicated. 5. Stable 6 mm nodule in the superior segment of the right lower lobe. This is reassuring, but the time interval is insufficient to ensure a benign etiology. Repeat noncontrast chest CT is recommended in 12 months to ensure continued stability. 6. Hepatic steatosis. Aortic Atherosclerosis (ICD10-I70.0). Electronically Signed   By: Vinnie Langton M.D.   On: 06/18/2018 20:38   Mr Brain Wo Contrast  Result Date: 06/17/2018 CLINICAL DATA:  Initial evaluation for gradually progressive weakness for several days, ataxia. EXAM: MRI HEAD WITHOUT CONTRAST TECHNIQUE: Multiplanar, multiecho pulse sequences of the brain and surrounding structures were obtained without intravenous contrast. COMPARISON:  Prior CT from 06/16/2018 as well as previous MRI from 06/13/2016 FINDINGS: Brain: Examination moderately degraded by motion artifact. Mildly advanced age-related cerebral atrophy. Extensive confluent T2/FLAIR hyperintensity throughout the periventricular and deep white matter both cerebral hemispheres, with involvement of the pons, most likely related chronic microvascular ischemic disease. Few small superimposed remote lacunar infarcts within the right thalamus. Punctate 5 mm infarct involving the cortical gray matter of the right precentral  gyrus (series 5, image 64). No associated hemorrhage or mass effect. No other evidence for acute or subacute ischemia. Gray-white matter differentiation otherwise maintained. No other areas of remote cortical infarction. No foci of susceptibility artifact to suggest acute or chronic intracranial hemorrhage. No mass lesion, midline shift or mass effect. No hydrocephalus. No extra-axial fluid collection. Pituitary gland suprasellar region normal. Midline structures intact and normal. Vascular: Major intracranial vascular flow voids maintained at the skull base. Skull and upper cervical spine: Craniocervical junction normal. Upper cervical spine within normal limits. Bone marrow signal intensity normal. No scalp soft tissue abnormality. Sinuses/Orbits: Globes orbital soft tissues within normal limits. Paranasal sinuses are clear. Left-sided mastoid effusion with partial opacification of the left middle ear cavity, similar to previous, and of doubtful significance in the acute setting. Trace fluid noted within the right mastoid air cells as well. Other: None. IMPRESSION: 1. 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 2. Underlying advanced cerebral white matter changes, most likely related to chronic microvascular ischemic disease, similar to previous. Electronically Signed   By: Jeannine Boga M.D.   On: 06/17/2018 01:38   Vas [place] Burnard Bunting With/wo Tbi  Result Date: 06/11/2018 LOWER EXTREMITY DOPPLER STUDY Indications: In 05/2017, an arterial Doppler showed an ABI of 1.20 on the right              and 1.17 on the left. Patient c/o intermittent worsening bilateral              lower extremity pain and weakness when lying down and standing.              Difficulty with walking, feels like both legs are going to "give              out". High Risk Factors: Hypertension, hyperlipidemia, Diabetes, past history of  smoking, prior TIA. Other Factors:  COPD.  Vascular Interventions: Atherectomy, PTA, and stenting of bilateral common iliac                         arteries in 07/2013. Performing Technologist: Sharlett Iles RVT  Examination Guidelines: A complete evaluation includes at minimum, Doppler waveform signals and systolic blood pressure reading at the level of bilateral brachial, anterior tibial, and posterior tibial arteries, when vessel segments are accessible. Bilateral testing is considered an integral part of a complete examination. Photoelectric Plethysmograph (PPG) waveforms and toe systolic pressure readings are included as required and additional duplex testing as needed. Limited examinations for reoccurring indications may be performed as noted.  ABI Findings: +---------+------------------+-----+---------+--------+ Right    Rt Pressure (mmHg)IndexWaveform Comment  +---------+------------------+-----+---------+--------+ Brachial 132                                      +---------+------------------+-----+---------+--------+ ATA      141               1.07 triphasic         +---------+------------------+-----+---------+--------+ PTA      153               1.16 triphasic         +---------+------------------+-----+---------+--------+ PERO     140               1.06 triphasic         +---------+------------------+-----+---------+--------+ Great Toe95                0.72 Normal            +---------+------------------+-----+---------+--------+ +---------+------------------+-----+---------+-------+ Left     Lt Pressure (mmHg)IndexWaveform Comment +---------+------------------+-----+---------+-------+ Brachial 129                                     +---------+------------------+-----+---------+-------+ ATA      144               1.09 triphasic        +---------+------------------+-----+---------+-------+ PTA      146               1.11 triphasic         +---------+------------------+-----+---------+-------+ PERO     143               1.08 triphasic        +---------+------------------+-----+---------+-------+ Great Toe95                0.72 Normal           +---------+------------------+-----+---------+-------+ +-------+-----------+-----------+------------+------------+ ABI/TBIToday's ABIToday's TBIPrevious ABIPrevious TBI +-------+-----------+-----------+------------+------------+ Right  1.16       .72        1.20        .77          +-------+-----------+-----------+------------+------------+ Left   1.11       .72        1.17        .82          +-------+-----------+-----------+------------+------------+ Right and Left ABIs and TBIs appear essentially unchanged compared to prior study on 06/11/2017.  Summary: Right: Resting right ankle-brachial index is within normal range. No evidence of significant right lower extremity arterial disease. The right toe-brachial index is normal. Left: Resting  left ankle-brachial index is within normal range. No evidence of significant left lower extremity arterial disease. The left toe-brachial index is normal.  *See table(s) above for measurements and observations.  Suggest follow up study in 12 months. See Aortoiliac Duplex report. Electronically signed by Ena Dawley MD on 06/11/2018 at 10:45:03 AM.    Final    US Thyroid  Result Date: 06/17/2018 CLINICAL DATA:  Nodule. Hyperthyroidism. Previous FNA biopsy of isthmic nodule 12/01/2015. EXAM: THYROID ULTRASOUND TECHNIQUE: Ultrasound examination of the thyroid gland and adjacent soft tissues was performed. COMPARISON:  11/24/2015 FINDINGS: Parenchymal Echotexture: Moderately heterogenous Isthmus: 0.9 cm thickness, previously 0.6 Right lobe: 6.4 x 3.3 x 2.7 cm, previously 6.7 x 2.7 x 2.5 Left lobe: 4.8 x 2.6 x 3 cm, previously 4.9 x 2.2 x 2.4 _________________________________________________________ Estimated total number of nodules >/= 1 cm:  6-10 Number of spongiform nodules >/=  2 cm not described below (TR1): 0 Number of mixed cystic and solid nodules >/= 1.5 cm not described below ([place]): 0 _________________________________________________________ Nodule # 1: 3.1 x 1.9 x 2.7 cm left isthmic nodule, previously 2.9 x 1.5 x 2.7; this was previously biopsied 0.9 cm benign colloid cyst, mid isthmus 1.3 cm benign colloid cyst, posterior mid right, previously 1.2 1.2 cm spongiform nodule, superior right, previously 1.3; This nodule does NOT meet TI-RADS criteria for biopsy or dedicated follow-up. Nodule # 5: Prior biopsy: No Location: Right; Inferior Maximum size: 1.1 cm; Other 2 dimensions: 0.8 x 0.9 cm, previously, 1.4 x 1.1 x 1.1 cm Composition: solid/almost completely solid (2) Echogenicity: isoechoic (1) Shape: not taller-than-wide (0) Margins: ill-defined (0) Echogenic foci: none (0) ACR TI-RADS total points: 3. ACR TI-RADS risk category:  TR3 (3 points). Significant change in size (>/= 20% in two dimensions and minimal increase of 2 mm): No Change in features: No Change in ACR TI-RADS risk category: No ACR TI-RADS recommendations: Given size (<1.4 cm) and appearance, this nodule does NOT meet TI-RADS criteria for biopsy or dedicated follow-up. _________________________________________________________ 0.9 cm complex cyst, superior right, previously 1 cm; This nodule does NOT meet TI-RADS criteria for biopsy or dedicated follow-up. 1.5 cm benign colloid cyst, superior left, previously 1.3 Nodule # 8: Prior biopsy: No Location: Left; Mid Maximum size: 1.4 cm; Other 2 dimensions: 1 x 1.1 cm, previously, 1.6 x 1 x 1.1 cm Composition: mixed cystic and solid (1) Echogenicity: hypoechoic (2) Shape: not taller-than-wide (0) Margins: smooth (0) Echogenic foci: none (0) ACR TI-RADS total points: 3. ACR TI-RADS risk category:  TR3 (3 points). Significant change in size (>/= 20% in two dimensions and minimal increase of 2 mm): No Change in features: No Change in  ACR TI-RADS risk category: No ACR TI-RADS recommendations: Given size (<1.4 cm) and appearance, this nodule does NOT meet TI-RADS criteria for biopsy or dedicated follow-up. _________________________________________________________ Nodule # 9: Prior biopsy: No Location: Left; Inferior Maximum size: 1.5 cm; Other 2 dimensions: 0.8 x 1.1 cm, previously, 1.4 x 1.1 x 1.4 cm Composition: solid/almost completely solid (2) Echogenicity: hypoechoic (2) Shape: not taller-than-wide (0) Margins: smooth (0) Echogenic foci: large comet-tail artifacts (0) ACR TI-RADS total points: 4. ACR TI-RADS risk category:  TR4 (4-6 points). Significant change in size (>/= 20% in two dimensions and minimal increase of 2 mm): No Change in features: No Change in ACR TI-RADS risk category: No ACR TI-RADS recommendations: **Given size (>/= 1.5 cm) and appearance, fine needle aspiration of this moderately suspicious nodule should be considered based on TI-RADS criteria. _________________________________________________________ Nodule # 10:  Prior biopsy: No Location: Left; Inferior posterior Maximum size: 1.4 cm; Other 2 dimensions: 0.9 x 1.3 cm, previously, 1.5 x 0.8 x 1.1 cm Composition: solid/almost completely solid (2) Echogenicity: hypoechoic (2) Shape: not taller-than-wide (0) Margins: smooth (0) Echogenic foci: none (0) ACR TI-RADS total points: 4. ACR TI-RADS risk category:  TR4 (4-6 points). Significant change in size (>/= 20% in two dimensions and minimal increase of 2 mm): No Change in features: No Change in ACR TI-RADS risk category: No ACR TI-RADS recommendations: *Given size (>/= 1 - 1.4 cm) and appearance, a follow-up ultrasound in 1 year should be considered based on TI-RADS criteria. IMPRESSION: 1. Thyromegaly with multiple nodules. 2. Recommend FNA biopsy of moderately suspicious 1.5 cm inferior left nodule (#9). 3. Recommend annual/biennial ultrasound follow-up of additional nodules as above, until stability x5 years confirmed.  The above is in keeping with the ACR TI-RADS recommendations - J Am Coll Radiol 2017;14:587-595. Electronically Signed   By: Lucrezia Europe M.D.   On: 06/17/2018 17:03   Vas US Carotid  Result Date: 06/17/2018 Carotid Arterial Duplex Study Indications: CVA. Limitations: breathing interference Performing Technologist: June Leap RDMS, RVT  Examination Guidelines: A complete evaluation includes B-mode imaging, spectral Doppler, color Doppler, and power Doppler as needed of all accessible portions of each vessel. Bilateral testing is considered an integral part of a complete examination. Limited examinations for reoccurring indications may be performed as noted.  Right Carotid Findings: +----------+--------+--------+--------+------------+--------+           PSV cm/sEDV cm/sStenosisDescribe    Comments +----------+--------+--------+--------+------------+--------+ CCA Prox  47      7                                    +----------+--------+--------+--------+------------+--------+ CCA Distal38      13              heterogenous         +----------+--------+--------+--------+------------+--------+ ICA Prox  29      10      1-39%   heterogenous         +----------+--------+--------+--------+------------+--------+ ICA Distal32      12                                   +----------+--------+--------+--------+------------+--------+ ECA       27      7                                    +----------+--------+--------+--------+------------+--------+ +----------+--------+-------+-----------------+-------------------+           PSV cm/sEDV cmsDescribe         Arm Pressure (mmHG) +----------+--------+-------+-----------------+-------------------+ [id number]             atypical waveform                    +----------+--------+-------+-----------------+-------------------+ +---------+--------+--+--------+----------+ VertebralPSV cm/s16EDV cm/sRetrograde  +---------+--------+--+--------+----------+ dampened monophasic flow throughout carotid  Left Carotid Findings: +----------+--------+--------+--------+-------------------------+--------+           PSV cm/sEDV cm/sStenosisDescribe                 Comments +----------+--------+--------+--------+-------------------------+--------+ CCA Prox  62      13                                                +----------+--------+--------+--------+-------------------------+--------+  CCA Distal81      14              heterogenous and calcific         +----------+--------+--------+--------+-------------------------+--------+ ICA Prox  123     20              heterogenous                      +----------+--------+--------+--------+-------------------------+--------+ ICA Distal97      24                                                +----------+--------+--------+--------+-------------------------+--------+ ECA       56      4                                                 +----------+--------+--------+--------+-------------------------+--------+ +----------+--------+--------+-----------------+-------------------+ SubclavianPSV cm/sEDV cm/sDescribe         Arm Pressure (mmHG) +----------+--------+--------+-----------------+-------------------+           175             atypical waveform                    +----------+--------+--------+-----------------+-------------------+ +---------+--------+--+--------+--+---------+ VertebralPSV cm/s75EDV cm/s13Antegrade +---------+--------+--+--------+--+---------+ monophasic flow throughout carotid  Summary: Right Carotid: Velocities in the right ICA are consistent with a 1-39% stenosis. Left Carotid: Velocities in the left ICA are consistent with a 1-39% stenosis. Vertebrals:  Bilateral vertebral arteries demonstrate antegrade flow. Subclavians: Normal flow hemodynamics were seen in bilateral subclavian              arteries. *See  table(s) above for measurements and observations.  Electronically signed by Antony Contras MD on 06/17/2018 at 3:20:50 PM.    Final    Vas [place] Lower Extremity Venous (dvt)  Result Date: 06/18/2018  Lower Venous Study Indications: Stroke.  Performing Technologist: Maudry Mayhew MHA, RDMS, RVT, RDCS  Examination Guidelines: A complete evaluation includes B-mode imaging, spectral Doppler, color Doppler, and power Doppler as needed of all accessible portions of each vessel. Bilateral testing is considered an integral part of a complete examination. Limited examinations for reoccurring indications may be performed as noted.  Right Venous Findings: +---------+---------------+---------+-----------+----------+--------------+          CompressibilityPhasicitySpontaneityPropertiesSummary        +---------+---------------+---------+-----------+----------+--------------+ CFV      Full           No       Yes                  pulsatile flow +---------+---------------+---------+-----------+----------+--------------+ SFJ      Full                                                        +---------+---------------+---------+-----------+----------+--------------+ FV Prox  Full                                                        +---------+---------------+---------+-----------+----------+--------------+  FV Mid   Full                                                        +---------+---------------+---------+-----------+----------+--------------+ FV DistalFull                                                        +---------+---------------+---------+-----------+----------+--------------+ PFV      Full                                                        +---------+---------------+---------+-----------+----------+--------------+ POP      Full           No       Yes                  pulsatile flow +---------+---------------+---------+-----------+----------+--------------+  PTV      Full                                                        +---------+---------------+---------+-----------+----------+--------------+ PERO     Full                                                        +---------+---------------+---------+-----------+----------+--------------+  Left Venous Findings: +---------+---------------+---------+-----------+----------+--------------+          CompressibilityPhasicitySpontaneityPropertiesSummary        +---------+---------------+---------+-----------+----------+--------------+ CFV      Full           No       Yes                  pulsatile flow +---------+---------------+---------+-----------+----------+--------------+ SFJ      Full                                                        +---------+---------------+---------+-----------+----------+--------------+ FV Prox  Full                                                        +---------+---------------+---------+-----------+----------+--------------+ FV Mid   Full                                                        +---------+---------------+---------+-----------+----------+--------------+  FV DistalFull                                                        +---------+---------------+---------+-----------+----------+--------------+ PFV      Full                                                        +---------+---------------+---------+-----------+----------+--------------+ POP      Full           No       Yes                  pulsatile flow +---------+---------------+---------+-----------+----------+--------------+ PTV      Full                                                        +---------+---------------+---------+-----------+----------+--------------+ PERO     Full                                                        +---------+---------------+---------+-----------+----------+--------------+    Summary: Right: There  is no evidence of deep vein thrombosis in the lower extremity. No cystic structure found in the popliteal fossa. Left: There is no evidence of deep vein thrombosis in the lower extremity. No cystic structure found in the popliteal fossa.  Pulsatile venous flow is suggestive of elevated right sided heart pressure. *See table(s) above for measurements and observations. Electronically signed by Curt Jews MD on 06/18/2018 at 2:36:14 PM.    Final    Vas US Aorta/ivc/iliacs  Result Date: 06/11/2018 ABDOMINAL AORTA STUDY Indications: In 05/2017, an aortoiliac duplex showed stable >50% restenosis in              the right common iliac artery stent and patent left common iliac              artery stent without restenosis. Patient c/o intermittent worsening              bilateral lower extremity pain and weakness when lying down and              standing. Difficulty with walking, feels like both legs are going              to "give out". Risk Factors: Hypertension, hyperlipidemia, Diabetes, past history of smoking,               prior TIA. Vascular Interventions: Atherectomy, PTA, and stenting of bilateral common iliac                         arteries in 07/2013. Limitations: Air/bowel gas and obesity.   Performing Technologist: Sharlett Iles RVT  Examination Guidelines: A complete evaluation includes B-mode imaging, spectral Doppler, color Doppler, and power Doppler as needed of all accessible portions of  each vessel. Bilateral testing is considered an integral part of a complete examination. Limited examinations for reoccurring indications may be performed as noted.  Abdominal Aorta Findings: +-----------+-------+----------+----------+---------+--------+--------+ Location   AP (cm)Trans (cm)PSV (cm/s)Waveform ThrombusComments +-----------+-------+----------+----------+---------+--------+--------+ Proximal                    54                                   +-----------+-------+----------+----------+---------+--------+--------+ Distal     NV                                                   +-----------+-------+----------+----------+---------+--------+--------+ RT EIA Prox                 118       triphasic                 +-----------+-------+----------+----------+---------+--------+--------+ LT EIA Prox                 84        biphasic                  +-----------+-------+----------+----------+---------+--------+--------+ Aortoiliac atherosclerosis. No evidence of stenosis in the bilateral external iliac arteries.  IVC/Iliac Findings: +--------+------+--------+--------+   IVC   PatentThrombusComments +--------+------+--------+--------+ IVC Proxpatent                 +--------+------+--------+--------+  Right Stent(s): +-------------------+--------+--------------+---------+--------+ Common Iliac ArteryPSV cm/sStenosis      Waveform Comments +-------------------+--------+--------------+---------+--------+ Prox to Stent      109                   triphasic         +-------------------+--------+--------------+---------+--------+ Proximal Stent     178     1-49% stenosisbiphasic          +-------------------+--------+--------------+---------+--------+ Mid Stent          160                   biphasic          +-------------------+--------+--------------+---------+--------+ Distal Stent       108                   biphasic          +-------------------+--------+--------------+---------+--------+ Distal to Stent    111                   biphasic          +-------------------+--------+--------------+---------+--------+ Patent right common iliac artery stent with mildly elevated velocities in the proximal and mid stent segment. These velocities appear decreased when compared to the previous exam.  Left Stent(s): +-------------------+--------+--------+--------+--------+ Common Iliac ArteryPSV  cm/sStenosisWaveformComments +-------------------+--------+--------+--------+--------+ Prox to Stent      170             biphasic         +-------------------+--------+--------+--------+--------+ Proximal Stent     103             biphasic         +-------------------+--------+--------+--------+--------+ Mid Stent          87              biphasic         +-------------------+--------+--------+--------+--------+  Distal Stent       74              biphasic         +-------------------+--------+--------+--------+--------+ Distal to Stent    115             biphasic         +-------------------+--------+--------+--------+--------+ Patent left common iliac artery stent without focal stenosis. Poor image quality due to body habitus, abdominal rigidity, overlying bowel gas and rapid respiration.  Summary: Stenosis: +------------------+---------------------------------------------+ Location          Stent                                         +------------------+---------------------------------------------+ Right Common IliacNow <50% stenosis, a decrease from prior exam +------------------+---------------------------------------------+ Patent left common iliac artery stent without focal stenosis. Patent IVC. *See table(s) above for measurements and observations. Suggest follow up study in 12 months.  See ABI report. Electronically signed by Ena Dawley MD on 06/11/2018 at 11:31:06 AM.    Final     Time Spent in minutes  30   Lala Lund M.D on 06/19/2018 at 8:52 AM  To page go to [email or web address] - password [facility]

## 2018-06-19 NOTE — Progress Notes (Signed)
  Speech Language Pathology Treatment: Cognitive-Linquistic  Patient Details Name: LORENA BENHAM MRN: [id number] DOB: 03-13-1941 Today's Date: 06/19/2018 Time: 1130-1150 SLP Time Calculation (min) (ACUTE ONLY): 20 min  Assessment / Plan / Recommendation Clinical Impression  Pt found sitting in recliner, stated she got up from the bed and transferred independently to chair without calling nursing.  Reviewed safety precautions; chair alarm set.  Pt denied difficulty with mobility nor needing assist.   Reviewed results from cognitive assessment, including impairments in memory and problem-solving.  Session focused on recording numbers, completing addition/subtraction and determining balances to simulate account balancing activities that she completes at home.  Pt with impulsivity, limited recognition of errors and limited ability to self-correct.    Pt did not recall recommendations from rehab therapists re: rehab stay prior to going home.  Reviewed its value to improve independence, safety.  Pt somewhat apathetic about recommendations.    Continue SLP to address cognition while pt remains in acute care.   HPI HPI: 78 y.o. female with a history of previous stroke, diabetes, hypertension, hypercholesterolemia, carotid artery disease who presents with dysarthric speech over the course of several days. MRI revealed new punctate infarct right precentral gyrus.      SLP Plan  Continue with current plan of care       Recommendations                   Follow up Recommendations: Skilled Nursing facility SLP Visit Diagnosis: Cognitive communication deficit (H29.924) Plan: Continue with current plan of care       [place]. Tivis Ringer, [place] CCC/SLP Acute Rehabilitation Services Office number [phone] Pager [phone]   Juan Quam Laurice 06/19/2018, 1:36 PM

## 2018-06-19 NOTE — Progress Notes (Signed)
Physical Therapy Treatment Patient Details Name: Caitlin Oliver MRN: [id number] DOB: [birth year] Today's Date: 06/19/2018    History of Present Illness Caitlin Oliver is a 78 y.o. female with history of hypertension, hyperlipidemia, peripheral vascular disease status post bilateral carotid endarterectomy and lower extremity stent placement, depression, diabetes mellitus type 2 was brought to the ER after patient had a brief episode of syncope while walking to go to the PCPs office.  Over the last 3 days patient has been finding increasingly difficult to walk with ataxia and staggering towards the left side.  Also has been having some slurred speech.  MRI showed Punctate 5 mm acute ischemic nonhemorrhagic cortical infarct involving the right precentral gyrus.    PT Comments    Pt still distractible and not fully aware of her deficits.  Emphasis on transitions, sit to stand and gait stability and stamina.    Follow Up Recommendations  SNF;Supervision/Assistance - 24 hour     Equipment Recommendations  Other (comment)(TBA)    Recommendations for Other Services       Precautions / Restrictions Precautions Precautions: Fall    Mobility  Bed Mobility Overal bed mobility: Needs Assistance             General bed mobility comments: up in the chair on arrival  Transfers Overall transfer level: Needs assistance Equipment used: Rolling walker (2 wheeled);None Transfers: Sit to/from Stand Sit to Stand: Min guard         General transfer comment: cues for hand placement, pt keeps trying to pull up on the RW  Ambulation/Gait Ambulation/Gait assistance: Min assist Gait Distance (Feet): 70 Feet Assistive device: Rolling walker (2 wheeled) Gait Pattern/deviations: Step-through pattern Gait velocity: slower   General Gait Details: mildly unsteady overall.  Fatigued out at about 60 feet and needed the chair.  SpO2 is difficult to get with cold hands and thick nail polish,, but one  reading was down to 86% during gait.  At rest after gait trial, sats rose to 89-90%, all on RA.  EHR 80's and 90's bpm   Stairs             Wheelchair Mobility    Modified Rankin (Stroke Patients Only) Modified Rankin (Stroke Patients Only) Pre-Morbid Rankin Score: No symptoms Modified Rankin: Moderately severe disability     Balance Overall balance assessment: Needs assistance;History of Falls   Sitting balance-Leahy Scale: Fair Sitting balance - Comments: when bed was not totally flat, pt's tended toward posterior bias.     Standing balance-Leahy Scale: Poor Standing balance comment: reliant on external support; multiple falls                            Cognition Arousal/Alertness: Awake/alert Behavior During Therapy: WFL for tasks assessed/performed Overall Cognitive Status: Difficult to assess                                        Exercises      General Comments        Pertinent Vitals/Pain Pain Assessment: Faces Faces Pain Scale: Hurts little more Pain Location: left rib  Pain Descriptors / Indicators: Cramping;Grimacing;Guarding Pain Intervention(s): Monitored during session    Home Living                      Prior Function  PT Goals (current goals can now be found in the care plan section) Acute Rehab PT Goals Patient Stated Goal: to get stronger PT Goal Formulation: With patient Time For Goal Achievement: 07/01/18 Potential to Achieve Goals: Good Progress towards PT goals: Progressing toward goals    Frequency    Min 3X/week      PT Plan Current plan remains appropriate    Co-evaluation              AM-PAC PT "6 Clicks" Mobility   Outcome Measure  Help needed turning from your back to your side while in a flat bed without using bedrails?: None Help needed moving from lying on your back to sitting on the side of a flat bed without using bedrails?: A Little Help needed moving  to and from a bed to a chair (including a wheelchair)?: A Little Help needed standing up from a chair using your arms (e.g., wheelchair or bedside chair)?: A Little Help needed to walk in hospital room?: A Little Help needed climbing 3-5 steps with a railing? : A Lot 6 Click Score: 18    End of Session Equipment Utilized During Treatment: Oxygen Activity Tolerance: Patient tolerated treatment well;Patient limited by fatigue Patient left: in chair;with call bell/phone within reach;with chair alarm set Nurse Communication: Mobility status PT Visit Diagnosis: Unsteadiness on feet (R26.81);Muscle weakness (generalized) (M62.81);Other symptoms and signs involving the nervous system (R29.898)     Time: 4388-8757 PT Time Calculation (min) (ACUTE ONLY): 22 min  Charges:  $Gait Training: 8-22 mins                     06/19/2018  Donnella Sham, [place] [phone]  (pager) [phone]  (office)   Tessie Fass Zamzam Whinery 06/19/2018, 3:14 PM

## 2018-06-19 NOTE — Progress Notes (Addendum)
Inpatient Diabetes Program Recommendations  AACE/ADA: New Consensus Statement on Inpatient Glycemic Control   Target Ranges:  Prepandial:   less than 140 mg/dL      Peak postprandial:   less than 180 mg/dL (1-2 hours)      Critically ill patients:  140 - 180 mg/dL  Results for Caitlin Oliver, Caitlin Oliver (MRN [id number]) as of 06/19/2018 11:08  Ref. Range 06/18/2018 08:26 06/18/2018 12:59 06/18/2018 16:11 06/18/2018 21:35 06/19/2018 08:33  Glucose-Capillary Latest Ref Range: 70 - 99 mg/dL 120 (H) 147 (H)  Novolog 1 unit 120 (H) 113 (H) 113 (H)   Results for Caitlin Oliver, Caitlin Oliver (MRN [id number]) as of 06/19/2018 11:08  Ref. Range 06/03/2017 14:14 06/17/2018 04:30  Hemoglobin A1C Latest Ref Range: 4.8 - 5.6 % 8.1 (H) 7.9 (H)   Review of Glycemic Control  Diabetes history: DM2 Outpatient Diabetes medications: Metformin 500 mg BID Current orders for Inpatient glycemic control: Novolog 0-9 units TID with meals  Inpatient Diabetes Program Recommendations:  HgbA1C: A1C 7.9% on 06/17/18 indicating an average glucose of 180 mg/dl. Per chart patient is currently taking Metformin 500 mg BID as an outpatient for DM control. Per notes in chart, patient will be discharged on insulin. However, over the past 24 hours glucose has ranged from 113-147 mg/dl and patient has only received Novolog 1 unit over past 24 hours. Do NOT anticipate patient will need insulin for DM control as an outpatient. Would recommend increasing Metformin to 1000 mg BID as an outpatient to improve DM control.   Addendum 06/19/18_0 :15-Spoke with patient about diabetes and home regimen for diabetes control. Patient reports that she is followed by PCP for diabetes management and currently she takes Metformin 500 mg BID as an outpatient for diabetes control. Patient reports that she is taking Metformin as prescribed and she checks glucose 1 time per day (in the morning) and that it is usually in the 100's mg/dl. Patient states that her last A1C was 7.9% and prior to that it  was 7%.  Patient notes that when she got it down to 7%, she was doing better with her diet but she admits that lately she has been eating more sweets than she should.  Discussed A1C results (7.9% on 06/17/18) and explained that his current A1C indicates an average glucose of 180 mg/dl over the past 2-3 months. Discussed glucose and A1C goals. Discussed importance of checking CBGs and maintaining good CBG control to prevent long-term and short-term complications. Discussed impact of nutrition, exercise, stress, sickness, and medications on diabetes control. Discussed carbohydrates, carbohydrate goals per day and meal, along with portion sizes. Encouraged patient to try to get back on track with following Carb Modified diet.  Inquired about potentially using insulin as an outpatient and patient states that her PCP has told her there are lots of other oral medications she could use for DM control if needed.  Patient prefers to use oral DM medications as an outpatient for DM control.  Informed patient that insulin would be reviewed just in case she needed insulin in the future.  Reviewed contents of insulin flexpen starter kit. Reviewed all steps of insulin pen including attachment of needle, 2-unit air shot, dialing up dose, giving injection, removing needle, disposal of sharps, storage of unused insulin, disposal of insulin etc. Patient able to provide successful return demonstration with verbal cues. Patient again stated that she did not want to use insulin at home and she would prefer her oral DM medications be increased or another oral DM  medication added to outpatient DM medications. Reviewed hyperglycemia and hypoglycemia along with treatment for each.  Encouraged patient to follow up with PCP regarding DM control. Patient verbalized understanding of information discussed and she states that she has no further questions at this time related to diabetes.  Thanks, Barnie Alderman, RN, MSN, CDE Diabetes  Coordinator Inpatient Diabetes Program [phone] (Team Pager from 8am to 5pm)

## 2018-06-20 DIAGNOSIS — J90 Pleural effusion, not elsewhere classified: Secondary | ICD-10-CM | POA: Diagnosis not present

## 2018-06-20 DIAGNOSIS — E785 Hyperlipidemia, unspecified: Secondary | ICD-10-CM | POA: Diagnosis not present

## 2018-06-20 DIAGNOSIS — M6281 Muscle weakness (generalized): Secondary | ICD-10-CM | POA: Diagnosis not present

## 2018-06-20 DIAGNOSIS — E041 Nontoxic single thyroid nodule: Secondary | ICD-10-CM | POA: Diagnosis not present

## 2018-06-20 DIAGNOSIS — R0602 Shortness of breath: Secondary | ICD-10-CM | POA: Diagnosis not present

## 2018-06-20 DIAGNOSIS — Z9981 Dependence on supplemental oxygen: Secondary | ICD-10-CM | POA: Diagnosis not present

## 2018-06-20 DIAGNOSIS — R278 Other lack of coordination: Secondary | ICD-10-CM | POA: Diagnosis not present

## 2018-06-20 DIAGNOSIS — I639 Cerebral infarction, unspecified: Secondary | ICD-10-CM | POA: Diagnosis not present

## 2018-06-20 DIAGNOSIS — I69398 Other sequelae of cerebral infarction: Secondary | ICD-10-CM | POA: Diagnosis not present

## 2018-06-20 DIAGNOSIS — M2550 Pain in unspecified joint: Secondary | ICD-10-CM | POA: Diagnosis not present

## 2018-06-20 DIAGNOSIS — R1312 Dysphagia, oropharyngeal phase: Secondary | ICD-10-CM | POA: Diagnosis not present

## 2018-06-20 DIAGNOSIS — Z7401 Bed confinement status: Secondary | ICD-10-CM | POA: Diagnosis not present

## 2018-06-20 DIAGNOSIS — J189 Pneumonia, unspecified organism: Secondary | ICD-10-CM | POA: Diagnosis not present

## 2018-06-20 DIAGNOSIS — G459 Transient cerebral ischemic attack, unspecified: Secondary | ICD-10-CM | POA: Diagnosis not present

## 2018-06-20 DIAGNOSIS — I272 Pulmonary hypertension, unspecified: Secondary | ICD-10-CM | POA: Diagnosis not present

## 2018-06-20 DIAGNOSIS — R55 Syncope and collapse: Secondary | ICD-10-CM | POA: Diagnosis not present

## 2018-06-20 DIAGNOSIS — R41841 Cognitive communication deficit: Secondary | ICD-10-CM | POA: Diagnosis not present

## 2018-06-20 DIAGNOSIS — I5032 Chronic diastolic (congestive) heart failure: Secondary | ICD-10-CM | POA: Diagnosis not present

## 2018-06-20 DIAGNOSIS — R531 Weakness: Secondary | ICD-10-CM | POA: Diagnosis not present

## 2018-06-20 DIAGNOSIS — F329 Major depressive disorder, single episode, unspecified: Secondary | ICD-10-CM | POA: Diagnosis not present

## 2018-06-20 LAB — BLOOD GAS, ARTERIAL
Acid-Base Excess: 4.2 mmol/L — ABNORMAL HIGH (ref 0.0–2.0)
Bicarbonate: 29.8 mmol/L — ABNORMAL HIGH (ref 20.0–28.0)
Drawn by: 511551
FIO2: 100
O2 Saturation: 99.8 %
Patient temperature: 98.6
pCO2 arterial: 58.6 mmHg — ABNORMAL HIGH (ref 32.0–48.0)
pH, Arterial: 7.326 — ABNORMAL LOW (ref 7.350–7.450)
pO2, Arterial: 363 mmHg — ABNORMAL HIGH (ref 83.0–108.0)

## 2018-06-20 LAB — GLUCOSE, CAPILLARY
Glucose-Capillary: 108 mg/dL — ABNORMAL HIGH (ref 70–99)
Glucose-Capillary: 108 mg/dL — ABNORMAL HIGH (ref 70–99)
Glucose-Capillary: 185 mg/dL — ABNORMAL HIGH (ref 70–99)

## 2018-06-20 MED ORDER — METOPROLOL TARTRATE 50 MG PO TABS: 50.0000 mg | ORAL_TABLET | Freq: Two times a day (BID) | ORAL | Status: DC

## 2018-06-20 MED ORDER — HYDROCODONE-ACETAMINOPHEN 5-325 MG PO TABS: 1.0000 | ORAL_TABLET | Freq: Two times a day (BID) | ORAL | 0 refills | Status: DC | PRN

## 2018-06-20 MED ORDER — INSULIN ASPART 100 UNIT/ML ~~LOC~~ SOLN: SUBCUTANEOUS | 0 refills | Status: DC

## 2018-06-20 MED ORDER — INSULIN GLARGINE 100 UNIT/ML ~~LOC~~ SOLN: 8.0000 [IU] | Freq: Every day | SUBCUTANEOUS | 0 refills | Status: DC

## 2018-06-20 MED ORDER — CLOPIDOGREL BISULFATE 75 MG PO TABS: 75.0000 mg | ORAL_TABLET | Freq: Every day | ORAL | Status: DC

## 2018-06-20 NOTE — Progress Notes (Addendum)
Patient in agreement with discharge to rehab. Patient's nephew completed admission paperwork for [facility] [employer]). Insurance authorization received [id number])  Cedric Fishman LCSW [phone]

## 2018-06-20 NOTE — Discharge Instructions (Signed)
Follow with Primary MD [name], PA-C in 7 days   Get CBC, CMP  checked  by SNF MD  in 7 days   Activity: As tolerated with Full fall precautions use walker/cane & assistance as needed  Disposition SNF  Diet: Heart Healthy - Low Carb  Accuchecks 4 times/day, Once in AM empty stomach and then before each meal. Log in all results and show them to your Prim.MD in 3 days. If any glucose reading is under 80 or above 300 call your Prim MD immidiately. Follow Low glucose instructions for glucose under 80 as instructed.   Special Instructions: If you have smoked or chewed Tobacco  in the last 2 yrs please stop smoking, stop any regular Alcohol  and or any Recreational drug use.  On your next visit with your primary care physician please Get Medicines reviewed and adjusted.  Please request your Prim.MD to go over all Hospital Tests and Procedure/Radiological results at the follow up, please get all Hospital records sent to your Prim MD by signing hospital release before you go home.  If you experience worsening of your admission symptoms, develop shortness of breath, life threatening emergency, suicidal or homicidal thoughts you must seek medical attention immediately by calling 911 or calling your MD immediately  if symptoms less severe.  You Must read complete instructions/literature along with all the possible adverse reactions/side effects for all the Medicines you take and that have been prescribed to you. Take any new Medicines after you have completely understood and accpet all the possible adverse reactions/side effects.

## 2018-06-20 NOTE — Clinical Social Work Placement (Signed)
   CLINICAL SOCIAL WORK PLACEMENT  NOTE  Date:  06/20/2018  Patient Details  Name: Caitlin Oliver MRN: [id number] Date of Birth: 19-May-1940  Clinical Social Work is seeking post-discharge placement for this patient at the [place] level of care (*CSW will initial, date and re-position this form in  chart as items are completed):  Yes   Patient/family provided with [place] Work Department's list of facilities offering this level of care within the geographic area requested by the patient (or if unable, by the patient's family).  Yes   Patient/family informed of their freedom to choose among providers that offer the needed level of care, that participate in Medicare, Medicaid or managed care program needed by the patient, have an available bed and are willing to accept the patient.  Yes   Patient/family informed of [facility]'s ownership interest in [facility] and [facility], as well as of the fact that they are under no obligation to receive care at these facilities.  PASRR submitted to EDS on 06/17/18     PASRR number received on 06/17/18     Existing PASRR number confirmed on       FL2 transmitted to all facilities in geographic area requested by pt/family on 06/17/18     FL2 transmitted to all facilities within larger geographic area on       Patient informed that his/her managed care company has contracts with or will negotiate with certain facilities, including the following:        Yes   Patient/family informed of bed offers received.  Patient [facility] bed at [facility]     Physician recommends and patient chooses bed at      Patient to be transferred to [facility] on 06/20/18.  Patient to be transferred to facility by PTAR     Patient family notified on 06/20/18 of transfer.  Name of family member notified:  Brother     PHYSICIAN       Additional Comment:     _______________________________________________ Benard Halsted, LCSW 06/20/2018, 12:05 PM

## 2018-06-20 NOTE — Progress Notes (Signed)
Inpatient Diabetes Program Recommendations  AACE/ADA: New Consensus Statement on Inpatient Glycemic Control   Target Ranges:  Prepandial:   less than 140 mg/dL      Peak postprandial:   less than 180 mg/dL (1-2 hours)      Critically ill patients:  140 - 180 mg/dL  Results for Caitlin Oliver, Caitlin Oliver (MRN [id number]) as of 06/20/2018 11:05  Ref. Range 06/19/2018 08:33 06/19/2018 11:54 06/19/2018 16:44 06/19/2018 23:22 06/20/2018 04:39 06/20/2018 07:27  Glucose-Capillary Latest Ref Range: 70 - 99 mg/dL 113 (H) 165 (H)  Novolog 2 units 125 (H)  Novolog 1 unit 185 (H) 108 (H) 108 (H)    Review of Glycemic Control Diabetes history: DM2 Outpatient Diabetes medications: Metformin 500 mg BID Current orders for Inpatient glycemic control: Novolog 0-9 units TID with meals  Inpatient Diabetes Program Recommendations:  HgbA1C: A1C 7.9% on 06/17/18 indicating an average glucose of 180 mg/dl. Patient is currently taking Metformin 500 mg BID as an outpatient for DM control. Over the past 24 hours glucose has ranged from 113-185 mg/dl and patient has only received a total of Novolog 3 unit over past 24 hours for correction. Once patient is discharged home, would recommend increasing Metformin to 1000 mg BID as an outpatient to improve DM control.   NOTE: Noted patient will be discharged to Rehab facility on Lantus 8 units QHS and Novolog correction TID based on glucose. Patient has not received any Lantus while inpatient. Concerned about hypoglycemia since patient is being discharged on basal insulin. Spoke with patient again regarding DM control and discussed signs and symptoms of hypoglycemia along with proper treatment. Patient states that she has never had hypoglycemia but she will be mindful of symptoms.  Encouraged patient to notify nursing staff at facility if she experiences any symptoms of hypoglycemia. Encouraged patient to follow up with PCP regarding DM control. Patient verbalized understanding of information discussed  and she states that she has no further questions at this time.  Thanks, Barnie Alderman, RN, MSN, CDE Diabetes Coordinator Inpatient Diabetes Program [phone] (Team Pager from 8am to 5pm)

## 2018-06-20 NOTE — Progress Notes (Signed)
Occupational Therapy Treatment Patient Details Name: Caitlin Oliver MRN: [id number] DOB: 04/10/1941 Today's Date: 06/20/2018    History of present illness Caitlin Oliver is a 78 y.o. female with history of hypertension, hyperlipidemia, peripheral vascular disease status post bilateral carotid endarterectomy and lower extremity stent placement, depression, diabetes mellitus type 2 was brought to the ER after patient had a brief episode of syncope while walking to go to the PCPs office.  Over the last 3 days patient has been finding increasingly difficult to walk with ataxia and staggering towards the left side.  Also has been having some slurred speech.  MRI showed Punctate 5 mm acute ischemic nonhemorrhagic cortical infarct involving the right precentral gyrus.   OT comments  Pt. Seen for skilled OT.  Able to complete seated grooming tasks, LB dressing task and review of energy conservation and fall prevention strategies.    Follow Up Recommendations       Equipment Recommendations       Recommendations for Other Services      Precautions / Restrictions Precautions Precautions: Fall Restrictions Weight Bearing Restrictions: No       Mobility Bed Mobility               General bed mobility comments: up in the chair on arrival  Transfers                      Balance                                           ADL either performed or assessed with clinical judgement   ADL Overall ADL's : Needs assistance/impaired     Grooming: Set up;Sitting;Supervision/safety               Lower Body Dressing: Set up;Sitting/lateral leans;Cueing for compensatory techniques;Cueing for safety;Cueing for sequencing Lower Body Dressing Details (indicate cue type and reason): pt. able to cross each leg over her knee but becomes notably SOB. educated on energy conservation strategies with focus on increasing rest breaks.                 General ADL  Comments: focused on energy conservation strategies and increasing rest breaks with functional activities.  pt. states " just got back from the bathroom and you wont believe how tired i got from that".  had pt. provide examples how to prevent falls she states: using rw, "holding onto things", "staying close to things". discussed other strategies to integrate during functional mobility.       Vision       Perception     Praxis      Cognition Arousal/Alertness: Awake/alert Behavior During Therapy: WFL for tasks assessed/performed Overall Cognitive Status: Within Functional Limits for tasks assessed                                          Exercises     Shoulder Instructions       General Comments      Pertinent Vitals/ Pain       Pain Assessment: No/denies pain Pain Location: left rib  Pain Descriptors / Indicators: Cramping;Grimacing;Guarding  Home Living  Prior Functioning/Environment              Frequency           Progress Toward Goals  OT Goals(current goals can now be found in the care plan section)        Plan      Co-evaluation                 AM-PAC OT "6 Clicks" Daily Activity     Outcome Measure                    End of Session        Activity Tolerance Patient tolerated treatment well   Patient Left in chair;with call bell/phone within reach   Nurse Communication          Time: 1517-6160 OT Time Calculation (min): 9 min  Charges: OT General Charges $OT Visit: 1 Visit OT Treatments $Self Care/Home Management : 8-22 mins  Janice Coffin, COTA/L 06/20/2018, 1:27 PM

## 2018-06-20 NOTE — Progress Notes (Signed)
Patient present with a new onset of confusion.  Patient is alert but disoriented to person, place, situation and time. Patient speech is slurred and continues to have left side facial droop. Patient was able to follow commands except for best gaze and visual assessment.  Patient toes and fingers blue. Unable to palpate pedal pulses. Vitals signs stable except oxygen sats in 80"s .  Placed the patient on continuous pulse and 2 liters of oxygen.  Notified Rapid response and C. Bodenheimer,  NP.  Will continue to monitor the patient and notify as needed.

## 2018-06-20 NOTE — Progress Notes (Addendum)
Patient will DC to: [facility] Anticipated DC date: 06/20/18 Family notified: Brother, Advertising account executive by: PTAR   RN please contact brother when [place] arrives: [phone]  Per MD patient ready for DC to [facility]. RN, patient, patient's family, and facility notified of DC. Discharge Summary and FL2 sent to facility. RN to call report prior to discharge [phone]). DC packet on chart. Ambulance transport requested for patient.   CSW will sign off for now as social work intervention is no longer needed. Please consult [place] again if new needs arise.  Cedric Fishman, LCSW Clinical Social Worker [phone]

## 2018-06-20 NOTE — Significant Event (Addendum)
Rapid Response Event Note  Overview: Neurologic and Hands/Feet were blue ?  Initial Focused Assessment: Called by RN about change in mental status, per RN about 20-30 minutes patient was following commands and answering questions. Per RN, now patient is not following commands, is confused, and per RN had hands/feet are blue. I asked the RN to place the patient on NRB (per RN could not get saturations) and lay the patient flat (+ CVA this admission).   When I arrived, patient was resting, she did not respond to voice, but did respond to painful stimuli, after sternal rub, patient was agitated, stated " leave me alone, I want to sleep." Spontaneously moved all extremities, no sensory loss appreciated. Patient would not cooperate with exam, pupils were reactive. Lung sounds were clear, extremities were cool to touch, weak pulses (asked RN to doppler them). Overall the patient had delayed capillary refill (chronic ? PAD related?).  Interventions: -- STAT ABG was ordered prior to my arrival 7.32/58/363/30. Patient was transitioned to [place] 2L, saturations were 99%. Mild sleep apnea at times noticed.  -- TRH NP updated, plan to monitor patient for now.  -- Towards the end, patient was more cooperative, no drift or sensory loss in any extremity, mild facial weakness and dysarthria which are not new symptoms. This seems more like delirium to me.   Plan of Care: -- Monitor mental status.  Event Summary:    at    Call Time 0455  Arrival Time 8889 End Time [place], Delice Lesch

## 2018-06-20 NOTE — Progress Notes (Signed)
Pt prepared for d/c to SNF. IV d/c'd. Skin intact except as charted in most recent assessments. Vitals are stable. Report called to receiving facility. Pt to be transported by ambulance service. Family aware.

## 2018-06-20 NOTE — Care Management Important Message (Signed)
Important Message  Patient Details  Name: JYNESIS NAKAMURA MRN: [id number] Date of Birth: 01/10/1941   Medicare Important Message Given:  Yes    Shelda Altes 06/20/2018, 2:04 PM

## 2018-06-24 DIAGNOSIS — I272 Pulmonary hypertension, unspecified: Secondary | ICD-10-CM | POA: Diagnosis not present

## 2018-06-24 DIAGNOSIS — E785 Hyperlipidemia, unspecified: Secondary | ICD-10-CM | POA: Diagnosis not present

## 2018-06-24 DIAGNOSIS — I639 Cerebral infarction, unspecified: Secondary | ICD-10-CM | POA: Diagnosis not present

## 2018-06-24 DIAGNOSIS — I5032 Chronic diastolic (congestive) heart failure: Secondary | ICD-10-CM | POA: Diagnosis not present

## 2018-06-26 DIAGNOSIS — I272 Pulmonary hypertension, unspecified: Secondary | ICD-10-CM | POA: Diagnosis not present

## 2018-06-26 DIAGNOSIS — E041 Nontoxic single thyroid nodule: Secondary | ICD-10-CM | POA: Diagnosis not present

## 2018-06-26 DIAGNOSIS — I5032 Chronic diastolic (congestive) heart failure: Secondary | ICD-10-CM | POA: Diagnosis not present

## 2018-06-26 DIAGNOSIS — E785 Hyperlipidemia, unspecified: Secondary | ICD-10-CM | POA: Diagnosis not present

## 2018-06-28 DIAGNOSIS — R0602 Shortness of breath: Secondary | ICD-10-CM | POA: Diagnosis not present

## 2018-07-01 ENCOUNTER — Other Ambulatory Visit: Payer: Self-pay | Admitting: *Deleted

## 2018-07-01 DIAGNOSIS — E785 Hyperlipidemia, unspecified: Secondary | ICD-10-CM | POA: Diagnosis not present

## 2018-07-01 DIAGNOSIS — J189 Pneumonia, unspecified organism: Secondary | ICD-10-CM | POA: Diagnosis not present

## 2018-07-01 DIAGNOSIS — J90 Pleural effusion, not elsewhere classified: Secondary | ICD-10-CM | POA: Diagnosis not present

## 2018-07-01 DIAGNOSIS — I639 Cerebral infarction, unspecified: Secondary | ICD-10-CM | POA: Diagnosis not present

## 2018-07-01 NOTE — Patient Outreach (Signed)
[facility]) Care Management  07/01/2018  Caitlin Oliver 29-Apr-1940 [id number]   Per PatientPing this patient admitted to skilled facility, noted that they are a [facility] patient.  This patient will be followed by [facility] in the community. [facility] will provide full case management services.  [facility] care management services are not appropriate at this time.   Plan to sign off. Will collaborate with [facility] as indicated.  Royetta Crochet. Laymond Purser, MSN, RN, [employer] , [place] [phone]) Business Cell  [phone]) Toll Free Office

## 2018-07-11 DIAGNOSIS — I272 Pulmonary hypertension, unspecified: Secondary | ICD-10-CM | POA: Diagnosis not present

## 2018-07-11 DIAGNOSIS — F329 Major depressive disorder, single episode, unspecified: Secondary | ICD-10-CM | POA: Diagnosis not present

## 2018-07-11 DIAGNOSIS — I639 Cerebral infarction, unspecified: Secondary | ICD-10-CM | POA: Diagnosis not present

## 2018-07-11 DIAGNOSIS — E785 Hyperlipidemia, unspecified: Secondary | ICD-10-CM | POA: Diagnosis not present

## 2018-07-15 DIAGNOSIS — I69398 Other sequelae of cerebral infarction: Secondary | ICD-10-CM | POA: Diagnosis not present

## 2018-07-15 DIAGNOSIS — Z7902 Long term (current) use of antithrombotics/antiplatelets: Secondary | ICD-10-CM | POA: Diagnosis not present

## 2018-07-15 DIAGNOSIS — E1151 Type 2 diabetes mellitus with diabetic peripheral angiopathy without gangrene: Secondary | ICD-10-CM | POA: Diagnosis not present

## 2018-07-15 DIAGNOSIS — I272 Pulmonary hypertension, unspecified: Secondary | ICD-10-CM | POA: Diagnosis not present

## 2018-07-15 DIAGNOSIS — I5032 Chronic diastolic (congestive) heart failure: Secondary | ICD-10-CM | POA: Diagnosis not present

## 2018-07-15 DIAGNOSIS — I110 Hypertensive heart disease with heart failure: Secondary | ICD-10-CM | POA: Diagnosis not present

## 2018-07-15 DIAGNOSIS — Z7984 Long term (current) use of oral hypoglycemic drugs: Secondary | ICD-10-CM | POA: Diagnosis not present

## 2018-07-15 DIAGNOSIS — E041 Nontoxic single thyroid nodule: Secondary | ICD-10-CM | POA: Diagnosis not present

## 2018-07-15 DIAGNOSIS — J449 Chronic obstructive pulmonary disease, unspecified: Secondary | ICD-10-CM | POA: Diagnosis not present

## 2018-07-15 DIAGNOSIS — M6281 Muscle weakness (generalized): Secondary | ICD-10-CM | POA: Diagnosis not present

## 2018-07-15 DIAGNOSIS — Z85110 Personal history of malignant carcinoid tumor of bronchus and lung: Secondary | ICD-10-CM | POA: Diagnosis not present

## 2018-07-17 ENCOUNTER — Other Ambulatory Visit: Payer: Self-pay | Admitting: *Deleted

## 2018-07-17 ENCOUNTER — Telehealth: Payer: Self-pay | Admitting: *Deleted

## 2018-07-17 DIAGNOSIS — I739 Peripheral vascular disease, unspecified: Secondary | ICD-10-CM

## 2018-07-17 DIAGNOSIS — I6523 Occlusion and stenosis of bilateral carotid arteries: Secondary | ICD-10-CM

## 2018-07-17 NOTE — Telephone Encounter (Signed)
Call from patient's family/caregiver. Patient is recently discharge from rehab s/p stroke. She is having decrease color and temperature to bilateral feet when in dependent position. She has been wearing TEDS and leg swelling has improved greatly. She state toes and feet pink up and temperate improved with elevation of lower extremities. Scheduled for ABI's and NP visit for Monday 4/6.

## 2018-07-21 ENCOUNTER — Ambulatory Visit (HOSPITAL_COMMUNITY)
Admission: RE | Admit: 2018-07-21 | Discharge: 2018-07-21 | Disposition: A | Discharge status: Home / Self Care | Payer: PPO | Source: Ambulatory Visit | Attending: Family | Admitting: Family

## 2018-07-21 ENCOUNTER — Ambulatory Visit (INDEPENDENT_AMBULATORY_CARE_PROVIDER_SITE_OTHER): Payer: PPO | Admitting: Family

## 2018-07-21 ENCOUNTER — Encounter: Payer: Self-pay | Admitting: Family

## 2018-07-21 ENCOUNTER — Ambulatory Visit (INDEPENDENT_AMBULATORY_CARE_PROVIDER_SITE_OTHER)
Admission: RE | Admit: 2018-07-21 | Discharge: 2018-07-21 | Disposition: A | Discharge status: Home / Self Care | Payer: PPO | Source: Ambulatory Visit | Attending: Family | Admitting: Family

## 2018-07-21 ENCOUNTER — Other Ambulatory Visit: Payer: Self-pay

## 2018-07-21 VITALS — BP 131/78 | HR 54 | Temp 97.3°F | Resp 18 | Ht 64.0 in | Wt 172.6 lb

## 2018-07-21 DIAGNOSIS — G8929 Other chronic pain: Secondary | ICD-10-CM | POA: Diagnosis not present

## 2018-07-21 DIAGNOSIS — I771 Stricture of artery: Secondary | ICD-10-CM | POA: Diagnosis not present

## 2018-07-21 DIAGNOSIS — I739 Peripheral vascular disease, unspecified: Secondary | ICD-10-CM | POA: Diagnosis not present

## 2018-07-21 DIAGNOSIS — R609 Edema, unspecified: Secondary | ICD-10-CM

## 2018-07-21 DIAGNOSIS — M545 Low back pain: Secondary | ICD-10-CM | POA: Diagnosis not present

## 2018-07-21 DIAGNOSIS — I6523 Occlusion and stenosis of bilateral carotid arteries: Secondary | ICD-10-CM

## 2018-07-21 DIAGNOSIS — Z9889 Other specified postprocedural states: Secondary | ICD-10-CM | POA: Diagnosis not present

## 2018-07-21 NOTE — Patient Instructions (Signed)
To decrease swelling in your feet and legs: Elevate feet above slightly bent knees, feet above heart, overnight and 3-4 times per day for 20 minutes.    Stroke Prevention Some medical conditions and lifestyle choices can lead to a higher risk for a stroke. You can help to prevent a stroke by making nutrition, lifestyle, and other changes. What nutrition changes can be made?   Eat healthy foods. ? Choose foods that are high in fiber. These include:  Fresh fruits.  Fresh vegetables.  Whole grains. ? Eat at least 5 or more servings of fruits and vegetables each day. Try to fill half of your plate at each meal with fruits and vegetables. ? Choose lean protein foods. These include:  Lowfat (lean) cuts of meat.  Chicken without skin.  Fish.  Tofu.  Beans.  Nuts. ? Eat low-fat dairy products. ? Avoid foods that:  Are high in salt (sodium).  Have saturated fat.  Have trans fat.  Have cholesterol.  Are processed.  Are premade.  Follow eating guidelines as told by your doctor. These may include: ? Reducing how many calories you eat and drink each day. ? Limiting how much salt you eat or drink each day to 1,500 milligrams (mg). ? Using only healthy fats for cooking. These include:  Olive oil.  Canola oil.  Sunflower oil. ? Counting how many carbohydrates you eat and drink each day. What lifestyle changes can be made?  Try to stay at a healthy weight. Talk to your doctor about what a good weight is for you.  Get at least 30 minutes of moderate physical activity at least 5 days a week. This can include: ? Fast walking. ? Biking. ? Swimming.  Do not use any products that have nicotine or tobacco. This includes cigarettes and e-cigarettes. If you need help quitting, ask your doctor. Avoid being around tobacco smoke in general.  Limit how much alcohol you drink to no more than 1 drink a day for nonpregnant women and 2 drinks a day for men. One drink equals 12 oz  of beer, 5 oz of wine, or 1 oz of hard liquor.  Do not use drugs.  Avoid taking birth control pills. Talk to your doctor about the risks of taking birth control pills if: ? You are over 18 years old. ? You smoke. ? You get migraines. ? You have had a blood clot. What other changes can be made?  Manage your cholesterol. ? It is important to eat a healthy diet. ? If your cholesterol cannot be managed through your diet, you may also need to take medicines. Take medicines as told by your doctor.  Manage your diabetes. ? It is important to eat a healthy diet and to exercise regularly. ? If your blood sugar cannot be managed through diet and exercise, you may need to take medicines. Take medicines as told by your doctor.  Control your high blood pressure (hypertension). ? Try to keep your blood pressure below 130/80. This can help lower your risk of stroke. ? It is important to eat a healthy diet and to exercise regularly. ? If your blood pressure cannot be managed through diet and exercise, you may need to take medicines. Take medicines as told by your doctor. ? Ask your doctor if you should check your blood pressure at home. ? Have your blood pressure checked every year. Do this even if your blood pressure is normal.  Talk to your doctor about getting checked for a  sleep disorder. Signs of this can include: ? Snoring a lot. ? Feeling very tired.  Take over-the-counter and prescription medicines only as told by your doctor. These may include aspirin or blood thinners (antiplatelets or anticoagulants).  Make sure that any other medical conditions you have are managed. Where to find more information  American Stroke Association: www.strokeassociation.org  National Stroke Association: www.stroke.org Get help right away if:  You have any symptoms of stroke. "BE FAST" is an easy way to remember the main warning signs: ? B - Balance. Signs are dizziness, sudden trouble walking, or loss  of balance. ? E - Eyes. Signs are trouble seeing or a sudden change in how you see. ? F - Face. Signs are sudden weakness or loss of feeling of the face, or the face or eyelid drooping on one side. ? A - Arms. Signs are weakness or loss of feeling in an arm. This happens suddenly and usually on one side of the body. ? S - Speech. Signs are sudden trouble speaking, slurred speech, or trouble understanding what people say. ? T - Time. Time to call emergency services. Write down what time symptoms started.  You have other signs of stroke, such as: ? A sudden, very bad headache with no known cause. ? Feeling sick to your stomach (nausea). ? Throwing up (vomiting). ? Jerky movements you cannot control (seizure). These symptoms may represent a serious problem that is an emergency. Do not wait to see if the symptoms will go away. Get medical help right away. Call your local emergency services (911 in the [place] Do not drive yourself to the hospital. Summary  You can prevent a stroke by eating healthy, exercising, not smoking, drinking less alcohol, and treating other health problems, such as diabetes, high blood pressure, or high cholesterol.  Do not use any products that contain nicotine or tobacco, such as cigarettes and e-cigarettes.  Get help right away if you have any signs or symptoms of a stroke. This information is not intended to replace advice given to you by your health care provider. Make sure you discuss any questions you have with your health care provider. Document Released: 10/02/2011 Document Revised: 07/04/2016 Document Reviewed: 07/04/2016 Elsevier Interactive Patient Education  2019 [employer].

## 2018-07-21 NOTE — Progress Notes (Signed)
Chief Complaint: Follow up Extracranial Carotid Artery Stenosis   History of Present Illness  Caitlin Oliver is a 78 y.o. female who is s/p left subclavian stent placement on 11-15-15 by Dr. Trula Slade. Prior to this she had staged bilateral carotid endarterectomy for asymptomatic high-grade stenosis: left CEA 05/07/13, right CEA 02/15/13.  Pt was last evaluated by S. Rhyne PA-C and Dr. Trula Slade on 06-10-17. At that time carotid duplex revealed 1-39% bilateral carotid artery stenosis.  The pt had weakness of bilateral arms/legs that was being worked up by neurology.  She was asymptomatic from her carotid disease.  Her left subclavian stent had normal hemodynamic flow.  Pt was to follow up in one year with NP with carotid duplex  She has undergone lower extremity revascularization in April 2015 by Dr. Gwenlyn Found.  Pt denies monocular loss of vision, denies lateralizing weakness or hemiplegia, denies speech difficulties. She denies any known history of stroke or TIA until possibly June of 2017. Pt also states that the left side of her face has drooped for years, that this is not new. She states her speech seems normal to her. She is using a walker for balance as she feels off balance. She does report concentration and short term memory issues since October 12, 2015. She states she saw a doctor at [facility], states she was advised to be evaluated in the ED but declined.   She states both hands tingle, especially when driving, left hand more so than right, but this resolves for a while.   Diabetic: yes, states her last A1C was 7.9, uncontrolled, she admits to eating too many sweets Tobacco use: former smoker, quit in February 2015, smoked for 56 years  Pt meds include: Statin : yes ASA: yes Other anticoagulants/antiplatelets: no, July 2015 she had a GI bleed and the Plavix was stopped   Past Medical History:  Diagnosis Date  . Anxiety   . Carotid artery disease ([place])   . Claudication ([place])    . COPD (chronic obstructive pulmonary disease) ([place])   . Diabetes mellitus   . Dizzy   . GERD (gastroesophageal reflux disease)   . Headache   . History of kidney stones   . Hypercholesteremia   . Hypertension   . 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 Shortness of breath    with exertion  . Stomach ulcer   . Tobacco abuse     Social History Social History   Tobacco Use  . Smoking status: Former Smoker    Packs/day: 1.50    Years: 56.00    Pack years: 84.00    Types: Cigarettes    Last attempt to quit: 07/07/2013    Years since quitting: 5.0  . Smokeless tobacco: Former Systems developer    Quit date: 02/25/2013  Substance Use Topics  . Alcohol use: No  . Drug use: No    Family History Family History  Problem Relation Age of Onset  . Stroke Mother   . Hypertension Mother   . Kidney failure Father     Surgical History Past Surgical History:  Procedure Laterality Date  . ABDOMINAL HYSTERECTOMY    . APPENDECTOMY    . BREAST REDUCTION SURGERY    . CAROTID ANGIOGRAM N/A 02/25/2013   Procedure: CAROTID ANGIOGRAM;  Surgeon: Lorretta Harp, MD;  Location: [facility] C[facility] LAB;  Service: Cardiovascular;  Laterality: N/A;  . ENDARTERECTOMY Right 03/06/2013   Procedure: ENDARTERECTOMY CAROTID-RIGHT;  Surgeon: Butch Penny  Trula Slade, MD;  Location: [place];  Service: Vascular;  Laterality: Right;  . ENDARTERECTOMY Left 05/07/2013   Procedure: LEFT CAROTID ARTERY ENDARTERECTOMY WITH VASCU-GUARD PATCH ANGIOPLASTY ;  Surgeon: Serafina Mitchell, MD;  Location: [place];  Service: Vascular;  Laterality: Left;  . LOBECTOMY Left 02/03/2016   Procedure: LEFT UPPER LOBECTOMY;  Surgeon: [name], MD;  Location: [place];  Service: Thoracic;  Laterality: Left;  . LOWER EXTREMITY ANGIOGRAM N/A 02/25/2013   Procedure: LOWER EXTREMITY ANGIOGRAM;  Surgeon: Lorretta Harp, MD;  Location: [facility] C[facility] LAB;  Service: Cardiovascular;  Laterality: N/A;  . LOWER EXTREMITY  ANGIOGRAM N/A 07/27/2013   Procedure: LOWER EXTREMITY ANGIOGRAM;  Surgeon: Lorretta Harp, MD;  Location[facility] C[facility] LAB;  Service: Cardiovascular;  Laterality: N/A;  . PATCH ANGIOPLASTY Right 03/06/2013   Procedure: PATCH ANGIOPLASTY of Right Carotid Artery using Vascu-Guard Patch;  Surgeon: Serafina Mitchell, MD;  Location: [place];  Service: Vascular;  Laterality: Right;  . PERIPHERAL VASCULAR CATHETERIZATION N/A 11/15/2015   Procedure: Aortic Arch Angiography;  Surgeon: Serafina Mitchell, MD;  Location: [place] CV LAB;  Service: Cardiovascular;  Laterality: N/A;  . PERIPHERAL VASCULAR CATHETERIZATION Bilateral 11/15/2015   Procedure: Carotid Angiography;  Surgeon: Serafina Mitchell, MD;  Location: [place] CV LAB;  Service: Cardiovascular;  Laterality: Bilateral;  . PERIPHERAL VASCULAR CATHETERIZATION Left 11/15/2015   Procedure: Upper Extremity Angiography;  Surgeon: Serafina Mitchell, MD;  Location: [place] CV LAB;  Service: Cardiovascular;  Laterality: Left;  . PERIPHERAL VASCULAR CATHETERIZATION Left 11/15/2015   Procedure: Peripheral Vascular Intervention;  Surgeon: Serafina Mitchell, MD;  Location: [place] CV LAB;  Service: Cardiovascular;  Laterality: Left;  subclavian   . SALIVARY GLAND SURGERY     scar tissue removed from left saliva glad  . TUBAL LIGATION    . VIDEO ASSISTED THORACOSCOPY (VATS)/WEDGE RESECTION Left 02/03/2016   Procedure: VIDEO ASSISTED THORACOSCOPY;  Surgeon: [name], MD;  Location: [place];  Service: Thoracic;  Laterality: Left;    Allergies  Allergen Reactions  . No Known Allergies Other (See Comments)    Current Outpatient Medications  Medication Sig Dispense Refill  . albuterol (PROVENTIL HFA;VENTOLIN HFA) 108 (90 Base) MCG/ACT inhaler Inhale 2 puffs into the lungs every 6 (six) hours as needed for wheezing or shortness of breath.     [name] BIOTIN 5000 PO Take 5,000 mg by mouth daily before supper.     . calcium carbonate (CALCIUM 600) 600 MG TABS  tablet Take 600 mg by mouth daily before supper.     . clopidogrel (PLAVIX) 75 MG tablet Take 1 tablet (75 mg total) by mouth daily.    [name] diltiazem (DILACOR XR) 180 MG 24 hr capsule Take 180 mg by mouth daily.     . DULoxetine (CYMBALTA) 60 MG capsule Take 1 capsule (60 mg total) by mouth daily. (Patient taking differently: Take 60 mg by mouth at bedtime. ) 90 capsule 3  . HYDROcodone-acetaminophen (NORCO/VICODIN) 5-325 MG tablet Take 1 tablet by mouth every 12 (twelve) hours as needed for moderate pain. 10 tablet 0  . insulin aspart (NOVOLOG) 100 UNIT/ML injection Substitute to any brand approved.Before each meal 3 times a day, 140-199 - 2 units, 200-250 - 4 units, 251-299 - 6 units,  300-349 - 8 units,  350 or above 10 units. Dispense syringes and needles as needed, Ok to switch to PEN if approved. DX DM2, Code E11.65 1 vial 0  . insulin glargine (LANTUS)  100 UNIT/ML injection Inject 0.08 mLs (8 Units total) into the skin at bedtime. Dispense insulin pen if approved, if not dispense as needed syringes and needles for 1 month supply. Can switch to Levemir. Diagnosis E 11.65. 10 mL 0  . MAGNESIUM PO Take 1 tablet by mouth daily before supper.    . Menthol, Topical Analgesic, (ICY HOT EX) Apply 1 spray topically every 8 (eight) hours as needed (pain).    . metoprolol tartrate (LOPRESSOR) 50 MG tablet Take 1 tablet (50 mg total) by mouth 2 (two) times daily with a meal.    . Omega-3 Fatty Acids (FISH OIL PO) Take 1 capsule by mouth daily.    . ondansetron (ZOFRAN) 4 MG tablet Take 4 mg by mouth at bedtime as needed for nausea or vomiting.     . ONE TOUCH ULTRA TEST test strip     . pantoprazole (PROTONIX) 40 MG tablet Take 40 mg by mouth 2 (two) times daily.     . pravastatin (PRAVACHOL) 40 MG tablet Take 40 mg by mouth daily before supper.      No current facility-administered medications for this visit.     Review of Systems : See HPI for pertinent positives and negatives.  Physical  Examination  Vitals:   07/21/18 1245  BP: 131/78  Pulse: (!) 54  Resp: 18  Temp: (!) 97.3 F (36.3 C)  TempSrc: Oral  SpO2: 94%  Weight: 172 lb 9.6 oz (78.3 kg)  Height: _0  (1.626 m)   Body mass index is 29.63 kg/m.  General: WDWN female in NAD, wearing surcial mask below her nose, over her mouth. GAIT: using walker, steady Eyes: PERRLA HENT: No gross abnormalities.  Pulmonary:  Respirations are non-labored, good air movement in all fields, no rales, rhonchi, or wheezes. Using supplemental O2 via [place].  Cardiac: regular rhythm, no detected murmur.  VASCULAR EXAM Carotid Bruits Right Left   Negative Negative     Abdominal aortic pulse is not palpable. Radial pulses are palpable bilaterally        Feet with 1+ non pitting dependent edema.                                                                                                                     LE Pulses Right Left       FEMORAL  not palpable, seated in chair  not palpable        POPLITEAL  not palpable   not palpable       POSTERIOR TIBIAL   palpable    palpable        DORSALIS PEDIS      ANTERIOR TIBIAL  palpable   palpable     Gastrointestinal: soft, nontender, BS WNL, no r/g, no palpable masses, central adiposity. Musculoskeletal: no muscle atrophy/wasting. M/S 4/5 throughout, extremities without ischemic changes Skin: No rashes, no ulcers, no cellulitis.   Neurologic:  A&O X 3; appropriate affect, sensation is normal; speech is normal, CN 2-12 intact  except moderate left side facial droop, pain and light touch intact in extremities, motor exam as listed above. Psychiatric: Normal thought content, mood appropriate to clinical situation.    Assessment: Caitlin Oliver is a 78 y.o. female who is s/p left subclavian stent placement on 11-15-15 by Dr. Trula Slade. Prior to this she had staged bilateral carotid endarterectomy  for asymptomatic high-grade stenosis: left CEA 05/07/13, right CEA 02/15/13.  She has  undergone lower extremity revascularization in April 2015 by Dr. Gwenlyn Found.  No recent stroke or TIA events.  Bilateral carotid arteries with 1-39% (minimal) stenosis, bilateral subclavian arteries with no stenosis, no change compared to previous in February 2019.  Bilateral ABI's remain normal with all triphasic waveforms, bilateral pedal pulses are palpable. No signs of ischemia in her feet or legs.  She does have 1+ non pitting dependent edema in both of her feet, she states she sits a great deal with her feet dependent. See Plan.    DATA  Carotid Duplex (07-21-18): Summary: Right Carotid: Velocities in the right ICA are consistent with a 1-39% stenosis.                Non-hemodynamically significant plaque <50% noted in the CCA. The                ECA appears <50% stenosed. Left Carotid: Velocities in the left ICA are consistent with a 1-39% stenosis.               Focal color aliasing may suggest ulcerated plaque in the left mid               common carotid artery. Plaque visualized is noted as calcific and               irregular. Vertebrals:  Left vertebral artery demonstrates antegrade flow. Right vertebral              artery demonstrates retrograde flow. Subclavians: Normal flow hemodynamics were seen in bilateral subclavian              arteries.    ABI (Date: 07/21/2018):  Right    Rt Pressure (mmHg)IndexWaveform Comment  +---------+------------------+-----+---------+--------+ Brachial 130                                      +---------+------------------+-----+---------+--------+ PTA      171               1.29 triphasic         +---------+------------------+-----+---------+--------+ DP       163               1.23 triphasic         +---------+------------------+-----+---------+--------+ Great Toe113               0.85 Normal             +---------+------------------+-----+---------+--------+  +---------+------------------+-----+---------+-------+ Left     Lt Pressure (mmHg)IndexWaveform Comment +---------+------------------+-----+---------+-------+ Brachial 133                                     +---------+------------------+-----+---------+-------+ PTA      151               1.14 triphasic        +---------+------------------+-----+---------+-------+ DP       134  1.01 triphasic        +---------+------------------+-----+---------+-------+ Great Toe111               0.83 Abnormal         +---------+------------------+-----+---------+-------+  +-------+-----------+-----------+------------+------------+ ABI/TBIToday's ABIToday's TBIPrevious ABIPrevious TBI +-------+-----------+-----------+------------+------------+ Right  1.29       0.85       1.16        0.72         +-------+-----------+-----------+------------+------------+ Left   1.14       0.83       1.11        0.72         +-------+-----------+-----------+------------+------------+  Bilateral ABIs and TBIs appear essentially unchanged compared to prior study on 06/11/2018.   Summary: Right: Resting right ankle-brachial index is within normal range. No evidence of significant right lower extremity arterial disease. The right toe-brachial index is normal. RT great toe pressure = 113 mmHg. Left: Resting left ankle-brachial index is within nomal range. No evidence of significant left lower extremity arterial disease. The left toe-brachial index is normal. LT Great toe pressure = 111 mmHg.   Plan: Follow-up in 1 year with Carotid Duplex scan.   Dependent Edema: Legs elevation when not walking, see Patient Instructions.    I discussed in depth with the patient the nature of atherosclerosis, and emphasized the importance of maximal medical management including strict control of blood pressure,  blood glucose, and lipid levels, obtaining regular exercise, and continued cessation of smoking.  The patient is aware that without maximal medical management the underlying atherosclerotic disease process will progress, limiting the benefit of any interventions. The patient was given information about stroke prevention and what symptoms should prompt the patient to seek immediate medical care. Thank you for allowing [place] to participate in this patient's care.  Clemon Chambers, RN, MSN, FNP-C [facility] Office: [phone]  Clinic Physician: Oneida Alar  07/21/18 4:03 PM

## 2018-07-22 DIAGNOSIS — I110 Hypertensive heart disease with heart failure: Secondary | ICD-10-CM | POA: Diagnosis not present

## 2018-07-22 DIAGNOSIS — I272 Pulmonary hypertension, unspecified: Secondary | ICD-10-CM | POA: Diagnosis not present

## 2018-07-22 DIAGNOSIS — R42 Dizziness and giddiness: Secondary | ICD-10-CM | POA: Diagnosis not present

## 2018-07-22 DIAGNOSIS — I5032 Chronic diastolic (congestive) heart failure: Secondary | ICD-10-CM | POA: Diagnosis not present

## 2018-07-28 ENCOUNTER — Ambulatory Visit: Payer: PPO | Admitting: Family

## 2018-07-28 ENCOUNTER — Encounter (HOSPITAL_COMMUNITY): Payer: PPO

## 2018-07-29 DIAGNOSIS — Z85118 Personal history of other malignant neoplasm of bronchus and lung: Secondary | ICD-10-CM | POA: Diagnosis not present

## 2018-07-29 DIAGNOSIS — I69398 Other sequelae of cerebral infarction: Secondary | ICD-10-CM | POA: Diagnosis not present

## 2018-07-29 DIAGNOSIS — I272 Pulmonary hypertension, unspecified: Secondary | ICD-10-CM | POA: Diagnosis not present

## 2018-07-29 DIAGNOSIS — J449 Chronic obstructive pulmonary disease, unspecified: Secondary | ICD-10-CM | POA: Diagnosis not present

## 2018-08-13 ENCOUNTER — Telehealth: Payer: Self-pay

## 2018-08-13 NOTE — Telephone Encounter (Signed)
  Patient returning call regarding results

## 2018-08-13 NOTE — Telephone Encounter (Signed)
Called to provide pt with the following info:  Lorretta Harp, MD  Annita Brod, RN        I reviewed her chest CT done for rule out pulmonary embolus. This was negative. She did incidentally have coronary calcification in all 3 coronary arteries. Her last Myoview was 6 years ago was negative. She does have PAD. If she is asymptomatic i.e. no chest pain or shortness of breath nothing further needs to be done. If she has symptoms she should have a Lexiscan to further rule out CAD.    LMTCB

## 2018-08-14 ENCOUNTER — Ambulatory Visit: Payer: PPO | Admitting: Neurology

## 2018-08-14 ENCOUNTER — Ambulatory Visit (INDEPENDENT_AMBULATORY_CARE_PROVIDER_SITE_OTHER): Payer: PPO | Admitting: Neurology

## 2018-08-14 ENCOUNTER — Encounter: Payer: Self-pay | Admitting: Neurology

## 2018-08-14 ENCOUNTER — Other Ambulatory Visit: Payer: Self-pay

## 2018-08-14 DIAGNOSIS — I6523 Occlusion and stenosis of bilateral carotid arteries: Secondary | ICD-10-CM

## 2018-08-14 DIAGNOSIS — I771 Stricture of artery: Secondary | ICD-10-CM | POA: Diagnosis not present

## 2018-08-14 DIAGNOSIS — I639 Cerebral infarction, unspecified: Secondary | ICD-10-CM

## 2018-08-14 NOTE — Telephone Encounter (Signed)
  Patient calling back for results. Please call her back after 1pm today. She will be running errands this morning

## 2018-08-14 NOTE — Progress Notes (Signed)
NEUROLOGY CLINIC FOLLOW UP NOTE  NAME: Caitlin Oliver DOB: 07/14/1940  HPI: Caitlin Oliver is a 78 y.o. female, follow up for Dr. Erlinda Hong.  She had past medical history of hypertension, hyperlipidemia, peripheral vascular disease,   s/p diamondback orbital rotational atherectomy, PTA and stenting using a cast stents of her iliac arteries by Dr. Judithann Sauger 07/27/13 resulted in complete resolution of her claudication symptoms and normalization of her Dopplers.   She had bilateral carotid artery stenosis in the past, status post bilateral carotid artery endarterectomy in 2014 6 weeks apart by Dr.Brabham. Followed with Dr. Gwenlyn Found and CUS on 09/01/14 showed left ICA 50-69% stenosis with left VA retrograde flow. She had recent CUS on 04/27/15 showed right ICA 1-39% stenosis but left ICA progressed to 60-79% stenosis with left VA retrograde flow.  She was also diagnosed with left subclavian steal syndrome, presented with sudden onset of dizziness, loss of balance, had left subclavian artery stenting by Dr.Braham in 2017,  She had GI bleeding in 2016 taking aspirin and the Plavix after stents placed in lower extremities, required 3 units of blood transfusion. GI workup showed bleeding ulcer in the stomach. She was told not to take any Plavix anymore. She is on aspirin 81 currently.    In 2017, she complains of persistent left-sided occipital pain, was diagnosed with left occipital neuralgia, had radiofrequency ablation of bilateral occipital nerve by Dr. Luan Pulling in 2018, which has helped her symptoms,  During her last visit with Dr. Erlinda Hong in February 2019, she began to complains of recurrent neck pain, from being achy pain in both arms and legs, weakness of 4 extremities intermittently, has to rely on her walker sometimes,  Work up for her limb proximal weakness including ESR, CRP, CK, ANA, aldolase, AchR abs, EMG/NCS all negative. Only A1C was 8.1.    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  Virtual Visit via Video  I connected with [name] on 08/14/18 at  by Video and verified that I am speaking with the correct person using two identifiers.   I discussed the limitations, risks, security and privacy concerns of performing an evaluation and management service by video and the availability of in person appointments. I also discussed with the patient that there may be a patient responsible charge related to this service. The patient expressed understanding and agreed to proceed.   History of Present Illness: She is at home with her niece [name] during today's interview.   I was able to review her most recent hospital discharge on June 17, 2018, he was brought to the emergency room after had a brief episode of passing out while walking to her primary care's office, prior to admission, she was noted to have increased difficulty walking, breathing,  She had extensive evaluation, I personally reviewed MRI of the brain on June 17, 2018, punctuated 5 mm acute ischemic nonhemorrhagic cortical infarction at the right precentral gyri, underlying advanced cerebral white matter small vessel disease generalized atrophy  CT  angiogram of head and neck: Severe atheromatous disease at the aortic arch and origin of the great vessels, 65% stenosis at the right brachiocephalic artery, 48% at proximal left common carotid artery, 60 to 70% at the right common carotid artery, 75% at proximal right subclavian artery, vascular stent was in place at the origin of the  left subclavian artery with widely patent flow, sequently of previous bilateral carotid artery endarterectomy without significant residual stenosis,  atherosclerotic stenosis up to 70% at the distal right internal carotid artery, no significant stenosis at the left internal carotid artery, relative sparing of the posterior circulation,  I went over medication list with patient, she is currently taking Plavix 75 mg daily, she was put on home oxygen, home physical therapy since hospital discharge,  She ambulate at home with a walker,  158/88, glucose 162,  Observations/Objective: I have reviewed problem lists, medications, allergies. She has nasal oxygen cannula on, reported blood pressure 158/88, glucose 162 today, awake alert cooperative on history taking, but needing help from her niece to give medication list, she has no detailed recollection of hospital admission in March  Assessment and Plan:  Carotid artery disease, peripheral vascular disease, subclavian artery stenosis, status post stent  S/p bilateral carotid artery endarterectomy  History of GI bleeding, was previously on ASA 35m daily.  Since hospital admission in March 2020, now on Plavix 75 mg  She has multiple vascular risk factors, hypertension, hyperlipidemia, diabetes, insulin-dependent, COPD, sedentary lifestyle  I also emphasized importance of moderate exercise, increase water intake   Follow Up Instructions:   6 months   I discussed the assessment and treatment plan with the patient. The patient was provided an opportunity to ask questions and all were answered. The patient agreed with the plan and demonstrated an understanding of the instructions.   The patient was advised to call back or seek an in-person evaluation if the symptoms worsen or if the condition fails to improve as anticipated.  I provided 30 minutes of non-face-to-face time during this encounter.   YMarcial Pacas MD

## 2018-08-18 NOTE — Telephone Encounter (Signed)
Ct results given Per pt no chest pain is on O2 at this time Pt states some days breathing is better Pt does not have an upcoming appt .Will forward to Dr Kennon Holter nurse to see when f/u is needed ./cy

## 2018-08-18 NOTE — Telephone Encounter (Signed)
Follow up   Patient is returning call for CT Results.

## 2018-08-20 ENCOUNTER — Telehealth: Payer: Self-pay | Admitting: Medical Oncology

## 2018-08-20 NOTE — Telephone Encounter (Signed)
CT scan not scheduled . Tried to call pt but phone line busy.

## 2018-08-21 NOTE — Telephone Encounter (Signed)
Talked to pt niece and told her to convey appt message to pt.

## 2018-08-26 ENCOUNTER — Telehealth: Payer: Self-pay | Admitting: Cardiovascular Disease

## 2018-08-26 NOTE — Telephone Encounter (Signed)
New Message            Patient is calling for results. Patients states she is not sure if she  Has already called for the results. Pls call to advise.

## 2018-08-28 ENCOUNTER — Telehealth: Payer: Self-pay | Admitting: *Deleted

## 2018-08-28 DIAGNOSIS — J449 Chronic obstructive pulmonary disease, unspecified: Secondary | ICD-10-CM | POA: Diagnosis not present

## 2018-08-28 DIAGNOSIS — I69398 Other sequelae of cerebral infarction: Secondary | ICD-10-CM | POA: Diagnosis not present

## 2018-08-28 DIAGNOSIS — I272 Pulmonary hypertension, unspecified: Secondary | ICD-10-CM | POA: Diagnosis not present

## 2018-08-28 DIAGNOSIS — Z85118 Personal history of other malignant neoplasm of bronchus and lung: Secondary | ICD-10-CM | POA: Diagnosis not present

## 2018-08-28 NOTE — Telephone Encounter (Signed)
A message was left.UD:JSHFWY up visit.

## 2018-09-18 NOTE — Telephone Encounter (Signed)
appt 6/19

## 2018-09-22 DIAGNOSIS — R51 Headache: Secondary | ICD-10-CM | POA: Diagnosis not present

## 2018-09-22 DIAGNOSIS — Z87891 Personal history of nicotine dependence: Secondary | ICD-10-CM | POA: Diagnosis not present

## 2018-09-22 DIAGNOSIS — H538 Other visual disturbances: Secondary | ICD-10-CM | POA: Diagnosis not present

## 2018-09-22 DIAGNOSIS — R300 Dysuria: Secondary | ICD-10-CM | POA: Diagnosis not present

## 2018-09-22 DIAGNOSIS — H53143 Visual discomfort, bilateral: Secondary | ICD-10-CM | POA: Diagnosis not present

## 2018-09-22 DIAGNOSIS — W19XXXA Unspecified fall, initial encounter: Secondary | ICD-10-CM | POA: Diagnosis not present

## 2018-09-23 ENCOUNTER — Ambulatory Visit
Admission: RE | Admit: 2018-09-23 | Discharge: 2018-09-23 | Disposition: A | Discharge status: Home / Self Care | Payer: PPO | Source: Ambulatory Visit | Attending: Family Medicine | Admitting: Family Medicine

## 2018-09-23 ENCOUNTER — Other Ambulatory Visit: Payer: Self-pay | Admitting: Family Medicine

## 2018-09-23 ENCOUNTER — Other Ambulatory Visit: Payer: Self-pay

## 2018-09-23 DIAGNOSIS — W19XXXA Unspecified fall, initial encounter: Secondary | ICD-10-CM

## 2018-09-23 DIAGNOSIS — S0003XA Contusion of scalp, initial encounter: Secondary | ICD-10-CM | POA: Diagnosis not present

## 2018-09-28 DIAGNOSIS — Z85118 Personal history of other malignant neoplasm of bronchus and lung: Secondary | ICD-10-CM | POA: Diagnosis not present

## 2018-09-28 DIAGNOSIS — I69398 Other sequelae of cerebral infarction: Secondary | ICD-10-CM | POA: Diagnosis not present

## 2018-09-28 DIAGNOSIS — I272 Pulmonary hypertension, unspecified: Secondary | ICD-10-CM | POA: Diagnosis not present

## 2018-09-28 DIAGNOSIS — J449 Chronic obstructive pulmonary disease, unspecified: Secondary | ICD-10-CM | POA: Diagnosis not present

## 2018-09-29 ENCOUNTER — Telehealth: Payer: Self-pay | Admitting: *Deleted

## 2018-09-29 NOTE — Telephone Encounter (Signed)
Received message from [employer] from the weekend that pt is requesting to cancel her appts tomorrow, 09/30/18. She is scheduled for labs, CT scan and Dr. Julien Nordmann in November  2020. Appts canceled for 09/30/18

## 2018-09-30 ENCOUNTER — Telehealth: Payer: Self-pay | Admitting: Cardiovascular Disease

## 2018-09-30 ENCOUNTER — Other Ambulatory Visit: Payer: PPO

## 2018-09-30 DIAGNOSIS — R2689 Other abnormalities of gait and mobility: Secondary | ICD-10-CM | POA: Diagnosis not present

## 2018-09-30 DIAGNOSIS — R29898 Other symptoms and signs involving the musculoskeletal system: Secondary | ICD-10-CM | POA: Diagnosis not present

## 2018-09-30 DIAGNOSIS — I10 Essential (primary) hypertension: Secondary | ICD-10-CM | POA: Diagnosis not present

## 2018-09-30 DIAGNOSIS — E7800 Pure hypercholesterolemia, unspecified: Secondary | ICD-10-CM | POA: Diagnosis not present

## 2018-09-30 DIAGNOSIS — F419 Anxiety disorder, unspecified: Secondary | ICD-10-CM | POA: Diagnosis not present

## 2018-09-30 DIAGNOSIS — K219 Gastro-esophageal reflux disease without esophagitis: Secondary | ICD-10-CM | POA: Diagnosis not present

## 2018-09-30 DIAGNOSIS — E119 Type 2 diabetes mellitus without complications: Secondary | ICD-10-CM | POA: Diagnosis not present

## 2018-09-30 NOTE — Telephone Encounter (Signed)
LVM for pre reg, no my chart, no email

## 2018-10-01 ENCOUNTER — Telehealth: Payer: Self-pay

## 2018-10-01 NOTE — Telephone Encounter (Signed)
Received the following message from Melton Krebs on 6/17 at 10:09am:  "[10/01/2018 10:09 AM]  Melton Krebs:   Caitlin Oliver 10:30 for 10/03/18 would like for you to give her a call..she wants to know if she needs blood work done or if she's already had it done.Marland KitchenOOI#[id number]   [10/01/2018 10:10 AM]  Melton Krebs:   add a zero after 4.."   Chart reviewed and it does not appear that pt needs any blood work prior to appt. Will contact pt 6/18 to update

## 2018-10-01 NOTE — Telephone Encounter (Signed)
home phone/ consent/ my chart declined/ pre reg completed

## 2018-10-02 ENCOUNTER — Ambulatory Visit: Payer: PPO | Admitting: Internal Medicine

## 2018-10-03 ENCOUNTER — Telehealth (INDEPENDENT_AMBULATORY_CARE_PROVIDER_SITE_OTHER): Payer: PPO | Admitting: Cardiovascular Disease

## 2018-10-03 ENCOUNTER — Telehealth: Payer: Self-pay

## 2018-10-03 ENCOUNTER — Encounter: Payer: Self-pay | Admitting: Cardiovascular Disease

## 2018-10-03 VITALS — BP 170/95 | HR 85 | Wt 182.6 lb

## 2018-10-03 DIAGNOSIS — I739 Peripheral vascular disease, unspecified: Secondary | ICD-10-CM | POA: Diagnosis not present

## 2018-10-03 DIAGNOSIS — I6523 Occlusion and stenosis of bilateral carotid arteries: Secondary | ICD-10-CM | POA: Diagnosis not present

## 2018-10-03 DIAGNOSIS — J449 Chronic obstructive pulmonary disease, unspecified: Secondary | ICD-10-CM | POA: Diagnosis not present

## 2018-10-03 DIAGNOSIS — I10 Essential (primary) hypertension: Secondary | ICD-10-CM

## 2018-10-03 DIAGNOSIS — E782 Mixed hyperlipidemia: Secondary | ICD-10-CM

## 2018-10-03 DIAGNOSIS — G458 Other transient cerebral ischemic attacks and related syndromes: Secondary | ICD-10-CM | POA: Diagnosis not present

## 2018-10-03 DIAGNOSIS — Z720 Tobacco use: Secondary | ICD-10-CM

## 2018-10-03 NOTE — Telephone Encounter (Signed)
LMTCB to discuss AVS for 6/19. Also stated that AVS would be mailed to pt address on file  Letter including After Visit Summary and any other necessary documents to be mailed to the patient's address on file.

## 2018-10-03 NOTE — Patient Instructions (Addendum)
Medication Instructions:  Your physician recommends that you continue on your current medications as directed. Please refer to the Current Medication list given to you today.  If you need a refill on your cardiac medications before your next appointment, please call your pharmacy.   Lab work: NONE If you have labs (blood work) drawn today and your tests are completely normal, you will receive your results only by: [name] MyChart Message (if you have MyChart) OR . A paper copy in the mail If you have any lab test that is abnormal or we need to change your treatment, we will call you to review the results.  Testing/Procedures: Your physician has requested that you have a lower or upper extremity arterial duplex. This test is an ultrasound of the arteries in the legs or arms. It looks at arterial blood flow in the legs and arms. Allow one hour for Lower and Upper Arterial scans. There are no restrictions or special instructions.  TO BE REPEATED IN ONE YEAR. YOU WILL BE CONTACTED BY  A SCHEDULER TO SET UP THIS APPOINTMENT.  Your physician has requested that you have an ankle brachial index (ABI). During this test an ultrasound and blood pressure cuff are used to evaluate the arteries that supply the arms and legs with blood. Allow thirty minutes for this exam. There are no restrictions or special instructions.  TO BE REPEATED IN ONE YEAR. YOU WILL BE CONTACTED BY  A SCHEDULER TO SET UP THIS APPOINTMENT.   Follow-Up: At [facility], you and your health needs are our priority.  As part of our continuing mission to provide you with exceptional heart care, we have created designated Provider Care Teams.  These Care Teams include your primary Cardiologist (physician) and Advanced Practice Providers (APPs -  Physician Assistants and Nurse Practitioners) who all work together to provide you with the care you need, when you need it. You will need a follow up appointment in 12 months.  Please call our office  2 months in advance to schedule this appointment. PLEASE HAVE YOUR ULTRASOUNDS COMPLETED BEFORE THIS APPOINTMENT.

## 2018-10-03 NOTE — Progress Notes (Signed)
Virtual Visit via Video Note   This visit type was conducted due to national recommendations for restrictions regarding the COVID-19 Pandemic (e.g. social distancing) in an effort to limit this patient's exposure and mitigate transmission in our community.  Due to her co-morbid illnesses, this patient is at least at moderate risk for complications without adequate follow up.  This format is felt to be most appropriate for this patient at this time.  All issues noted in this document were discussed and addressed.  A limited physical exam was performed with this format.  Please refer to the patient's chart for her consent to telehealth for [facility].   Date:  10/03/2018   ID:  Caitlin Oliver, DOB Nov 10, 1940, MRN [id number]  Patient Location: Home Provider Location: Home  PCP:  [name], PA-C  Cardiologist: Dr. Quay Burow Electrophysiologist:  None   Evaluation Performed:  Follow-Up Visit  Chief Complaint: Follow-up hypertension, hyperlipidemia and PAD  History of Present Illness:    Caitlin Oliver is a 78 y.o.  mildly overweight widowed Caucasian female no children (son died 22 years ago) referred by Emmie Niemann PA-C at [facility] for evaluation and treatment of lifestyle limiting claudication. I last saw her in the office  06/11/2017.  She is accompanied by her niece Caitlin Oliver today.  Her .Risk factors included hypertension, hyperlipidemia and type 2 diabetes. She has a 75-pack-year history of tobacco abuse smoking one and a half packs a day however she has recently quit smoking prior to her interventions.. There's no family history of heart disease. She's never had a heart for stroke. She does complain of dyspnea but denies chest pain. She complains of bilateral lower extremity claudication while walking up an incline which is lifestyle limiting. I age of Imdur 02/25/13 revealing high-grade bilateral internal carotid artery stenosis,  high-grade bilateral calcified iliac artery stenosis as most common femoral artery stenoses. She had staged right followed by left carotid endarterectomy performed by Dr.Brabhamwhich she has recovered from. Because of lifestyle limiting claudication she underwent diamondback orbital rotational atherectomy, PT A. And stenting using ICast covered stents by myself on 07/27/13 which resulted in complete resolution of her claudication symptoms. Her Dopplers normalized as well though she did have a high-frequency signal in the right common femoral artery. She has had a GI bleed in the past and her Plavix was discontinued.She denies chest pain, shortness of breath as well as claudication. She has had left subclavian stenting by Dr. Trula Slade 11/15/15. In addition, she had left upper lobectomy by Dr. Roxan Hockey 02/03/16 because of stage IA squamous cell carcinoma. She has recovered from this.  Since I saw her a year ago she's remained clinically stable.  She is on constant home oxygen.  She gets around with a walker.  She denies chest pain but is always short of breath.  Recent Dopplers performed 07/21/2018 revealed normal ABIs.  She does complain of pain in her legs which I do not think is claudication.  Her lipid profile is excellent.  She did stop smoking several years ago when I asked her to.  She is sheltering in place and socially distancing.  The patient does not have symptoms concerning for COVID-19 infection (fever, chills, cough, or new shortness of breath).    Past Medical History:  Diagnosis Date  . Anxiety   . Carotid artery disease ([place])   . Claudication ([place])   . COPD (chronic obstructive pulmonary disease) ([place])   . Diabetes mellitus   .  Dizzy   . GERD (gastroesophageal reflux disease)   . Headache   . History of kidney stones   . Hypercholesteremia   . Hypertension   . 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 Shortness of breath    with  exertion  . Stomach ulcer   . Tobacco abuse    Past Surgical History:  Procedure Laterality Date  . ABDOMINAL HYSTERECTOMY    . APPENDECTOMY    . BREAST REDUCTION SURGERY    . CAROTID ANGIOGRAM N/A 02/25/2013   Procedure: CAROTID ANGIOGRAM;  Surgeon: Lorretta Harp, MD;  Location[facility] CATH LAB;  Service: Cardiovascular;  Laterality: N/A;  . ENDARTERECTOMY Right 03/06/2013   Procedure: ENDARTERECTOMY CAROTID-RIGHT;  Surgeon: Serafina Mitchell, MD;  Location: [place];  Service: Vascular;  Laterality: Right;  . ENDARTERECTOMY Left 05/07/2013   Procedure: LEFT CAROTID ARTERY ENDARTERECTOMY WITH VASCU-GUARD PATCH ANGIOPLASTY ;  Surgeon: Serafina Mitchell, MD;  Location: [place];  Service: Vascular;  Laterality: Left;  . LOBECTOMY Left 02/03/2016   Procedure: LEFT UPPER LOBECTOMY;  Surgeon: [name], MD;  Location: [place];  Service: Thoracic;  Laterality: Left;  . LOWER EXTREMITY ANGIOGRAM N/A 02/25/2013   Procedure: LOWER EXTREMITY ANGIOGRAM;  Surgeon: Lorretta Harp, MD;  Location: [facility] CATH LAB;  Service: Cardiovascular;  Laterality: N/A;  . LOWER EXTREMITY ANGIOGRAM N/A 07/27/2013   Procedure: LOWER EXTREMITY ANGIOGRAM;  Surgeon: Lorretta Harp, MD;  Location: [facility] CATH LAB;  Service: Cardiovascular;  Laterality: N/A;  . PATCH ANGIOPLASTY Right 03/06/2013   Procedure: PATCH ANGIOPLASTY of Right Carotid Artery using Vascu-Guard Patch;  Surgeon: Serafina Mitchell, MD;  Location: [place];  Service: Vascular;  Laterality: Right;  . PERIPHERAL VASCULAR CATHETERIZATION N/A 11/15/2015   Procedure: Aortic Arch Angiography;  Surgeon: Serafina Mitchell, MD;  Location: [place] CV LAB;  Service: Cardiovascular;  Laterality: N/A;  . PERIPHERAL VASCULAR CATHETERIZATION Bilateral 11/15/2015   Procedure: Carotid Angiography;  Surgeon: Serafina Mitchell, MD;  Location: [place] CV LAB;  Service: Cardiovascular;  Laterality: Bilateral;  . PERIPHERAL VASCULAR CATHETERIZATION Left 11/15/2015   Procedure: Upper  Extremity Angiography;  Surgeon: Serafina Mitchell, MD;  Location: [place] CV LAB;  Service: Cardiovascular;  Laterality: Left;  . PERIPHERAL VASCULAR CATHETERIZATION Left 11/15/2015   Procedure: Peripheral Vascular Intervention;  Surgeon: Serafina Mitchell, MD;  Location: [place] CV LAB;  Service: Cardiovascular;  Laterality: Left;  subclavian   . SALIVARY GLAND SURGERY     scar tissue removed from left saliva glad  . TUBAL LIGATION    . VIDEO ASSISTED THORACOSCOPY (VATS)/WEDGE RESECTION Left 02/03/2016   Procedure: VIDEO ASSISTED THORACOSCOPY;  Surgeon: [name], MD;  Location: [place];  Service: Thoracic;  Laterality: Left;     Current Meds  Medication Sig  . albuterol (PROVENTIL HFA;VENTOLIN HFA) 108 (90 Base) MCG/ACT inhaler Inhale 2 puffs into the lungs every 6 (six) hours as needed for wheezing or shortness of breath.   [name] aspirin 81 MG chewable tablet Chew by mouth daily.  [name] BIOTIN 5000 PO Take 5,000 mg by mouth daily before supper.   . calcium carbonate (CALCIUM 600) 600 MG TABS tablet Take 600 mg by mouth daily before supper.   . clopidogrel (PLAVIX) 75 MG tablet Take 1 tablet (75 mg total) by mouth daily.  . diazepam (VALIUM) 2 MG tablet Take 5 mg by mouth every 8 (eight) hours as needed for anxiety.   [name]  diltiazem (DILACOR XR) 180 MG 24 hr capsule Take 180 mg by mouth daily.   . furosemide (LASIX) 20 MG tablet Take 20 mg by mouth.   [name] HYDROcodone-acetaminophen (NORCO/VICODIN) 5-325 MG tablet Take 1 tablet by mouth every 12 (twelve) hours as needed for moderate pain.  [name] lisinopril (ZESTRIL) 10 MG tablet Take 10 mg by mouth daily.  [name] MAGNESIUM PO Take 1 tablet by mouth daily before supper.  . meclizine (ANTIVERT) 12.5 MG tablet TAKE ONE TABLET BY MOUTH THREE TIMES DAILY AS NEEDED FOR dizziness  . Menthol, Topical Analgesic, (ICY HOT EX) Apply 1 spray topically every 8 (eight) hours as needed (pain).  . metFORMIN (GLUCOPHAGE) 500 MG tablet Take by mouth 2 (two) times daily  with a meal.  . metoprolol tartrate (LOPRESSOR) 50 MG tablet Take 1 tablet (50 mg total) by mouth 2 (two) times daily with a meal.  . Omega-3 Fatty Acids (FISH OIL PO) Take 1 capsule by mouth daily.  . ondansetron (ZOFRAN) 4 MG tablet Take 4 mg by mouth at bedtime as needed for nausea or vomiting.   . ONE TOUCH ULTRA TEST test strip   . pantoprazole (PROTONIX) 40 MG tablet Take 40 mg by mouth 2 (two) times daily.   . pravastatin (PRAVACHOL) 40 MG tablet Take 40 mg by mouth daily before supper.      Allergies:   No known allergies   Social History   Tobacco Use  . Smoking status: Former Smoker    Packs/day: 1.50    Years: 56.00    Pack years: 84.00    Types: Cigarettes    Quit date: 07/07/2013    Years since quitting: 5.2  . Smokeless tobacco: Former Systems developer    Quit date: 02/25/2013  Substance Use Topics  . Alcohol use: No  . Drug use: No     Family Hx: The patient's family history includes Hypertension in her mother; Kidney failure in her father; Stroke in her mother.  ROS:   Please see the history of present illness.     All other systems reviewed and are negative.   Prior CV studies:   The following studies were reviewed today:  Lower extremity arterial Doppler studies and carotid Doppler studies performed 07/21/2018  Labs/Other Tests and Data Reviewed:    EKG:  No ECG reviewed.  Recent Labs: 06/17/2018: ALT 63; Hemoglobin 13.2; Platelets 245; TSH 1.620 06/18/2018: BUN 21; Creatinine, Ser 0.98; Magnesium 1.9; Potassium 4.5; Sodium 134   Recent Lipid Panel Lab Results  Component Value Date/Time   CHOL 99 06/17/2018 04:30 AM   TRIG 122 06/17/2018 04:30 AM   HDL 31 (L) 06/17/2018 04:30 AM   CHOLHDL 3.2 06/17/2018 04:30 AM   LDLCALC 44 06/17/2018 04:30 AM    Wt Readings from Last 3 Encounters:  10/03/18 182 lb 9.6 oz (82.8 kg)  07/21/18 172 lb 9.6 oz (78.3 kg)  06/20/18 183 lb 8 oz (83.2 kg)     Objective:    Vital Signs:  BP (!) 170/95   Pulse 85   Wt 182 lb  9.6 oz (82.8 kg)   BMI 31.34 kg/m    VITAL SIGNS:  reviewed GEN:  no acute distress RESPIRATORY:  normal respiratory effort, symmetric expansion NEURO:  alert and oriented x 3, no obvious focal deficit PSYCH:  normal affect  ASSESSMENT & PLAN:    1. Peripheral arterial disease- history of PAD status post bilateral iliac stenting with covered stents by myself 07/27/2013.  Recent Dopplers performed 07/21/2018  revealed normal ABIs with patent stents. 2. Carotid artery disease- history of staged bilateral carotid endarterectomies performed by Dr. Trula Slade back in 2014 with recent Dopplers performed 07/21/2018 that revealed the endarterectomy site to be widely patent 3. COPD- long history tobacco abuse having quit several years ago on home O2 4. Essential hypertension- history of essential hypertension with blood pressure measured today 170/95 with a pulse of 85.  She says ordinarily her blood pressures in the 140/80 range at home.  She is on lisinopril, metoprolol and diltiazem. 5. Hyperlipidemia- history of hyperlipidemia on pravastatin with lipid profile performed 3/33/20 revealing total cholesterol 99, LDL 44 and HDL 31.  COVID-19 Education: The signs and symptoms of COVID-19 were discussed with the patient and how to seek care for testing (follow up with PCP or arrange E-visit).  The importance of social distancing was discussed today.  Time:   Today, I have spent 8 minutes with the patient with telehealth technology discussing the above problems.     Medication Adjustments/Labs and Tests Ordered: Current medicines are reviewed at length with the patient today.  Concerns regarding medicines are outlined above.   Tests Ordered: No orders of the defined types were placed in this encounter.   Medication Changes: No orders of the defined types were placed in this encounter.   Follow Up:  In Person in 1 year(s)  Signed, Quay Burow, MD  10/03/2018 10:06 AM    [place]

## 2018-10-09 DIAGNOSIS — J449 Chronic obstructive pulmonary disease, unspecified: Secondary | ICD-10-CM | POA: Diagnosis not present

## 2018-10-14 DIAGNOSIS — N390 Urinary tract infection, site not specified: Secondary | ICD-10-CM | POA: Diagnosis not present

## 2018-10-14 DIAGNOSIS — R7981 Abnormal blood-gas level: Secondary | ICD-10-CM | POA: Diagnosis not present

## 2018-10-14 DIAGNOSIS — R300 Dysuria: Secondary | ICD-10-CM | POA: Diagnosis not present

## 2018-10-14 DIAGNOSIS — R29898 Other symptoms and signs involving the musculoskeletal system: Secondary | ICD-10-CM | POA: Diagnosis not present

## 2018-10-14 DIAGNOSIS — R5381 Other malaise: Secondary | ICD-10-CM | POA: Diagnosis not present

## 2018-10-21 DIAGNOSIS — I110 Hypertensive heart disease with heart failure: Secondary | ICD-10-CM | POA: Diagnosis not present

## 2018-10-21 DIAGNOSIS — Z9181 History of falling: Secondary | ICD-10-CM | POA: Diagnosis not present

## 2018-10-21 DIAGNOSIS — F329 Major depressive disorder, single episode, unspecified: Secondary | ICD-10-CM | POA: Diagnosis not present

## 2018-10-21 DIAGNOSIS — K219 Gastro-esophageal reflux disease without esophagitis: Secondary | ICD-10-CM | POA: Diagnosis not present

## 2018-10-21 DIAGNOSIS — I6523 Occlusion and stenosis of bilateral carotid arteries: Secondary | ICD-10-CM | POA: Diagnosis not present

## 2018-10-21 DIAGNOSIS — E1151 Type 2 diabetes mellitus with diabetic peripheral angiopathy without gangrene: Secondary | ICD-10-CM | POA: Diagnosis not present

## 2018-10-21 DIAGNOSIS — I5032 Chronic diastolic (congestive) heart failure: Secondary | ICD-10-CM | POA: Diagnosis not present

## 2018-10-21 DIAGNOSIS — Z7982 Long term (current) use of aspirin: Secondary | ICD-10-CM | POA: Diagnosis not present

## 2018-10-21 DIAGNOSIS — C3492 Malignant neoplasm of unspecified part of left bronchus or lung: Secondary | ICD-10-CM | POA: Diagnosis not present

## 2018-10-21 DIAGNOSIS — Z7984 Long term (current) use of oral hypoglycemic drugs: Secondary | ICD-10-CM | POA: Diagnosis not present

## 2018-10-21 DIAGNOSIS — I272 Pulmonary hypertension, unspecified: Secondary | ICD-10-CM | POA: Diagnosis not present

## 2018-10-21 DIAGNOSIS — G43909 Migraine, unspecified, not intractable, without status migrainosus: Secondary | ICD-10-CM | POA: Diagnosis not present

## 2018-10-21 DIAGNOSIS — J449 Chronic obstructive pulmonary disease, unspecified: Secondary | ICD-10-CM | POA: Diagnosis not present

## 2018-10-21 DIAGNOSIS — Z7902 Long term (current) use of antithrombotics/antiplatelets: Secondary | ICD-10-CM | POA: Diagnosis not present

## 2018-10-21 DIAGNOSIS — E7800 Pure hypercholesterolemia, unspecified: Secondary | ICD-10-CM | POA: Diagnosis not present

## 2018-10-21 DIAGNOSIS — G8220 Paraplegia, unspecified: Secondary | ICD-10-CM | POA: Diagnosis not present

## 2018-10-21 DIAGNOSIS — Z87891 Personal history of nicotine dependence: Secondary | ICD-10-CM | POA: Diagnosis not present

## 2018-10-21 DIAGNOSIS — Z87440 Personal history of urinary (tract) infections: Secondary | ICD-10-CM | POA: Diagnosis not present

## 2018-10-28 DIAGNOSIS — Z85118 Personal history of other malignant neoplasm of bronchus and lung: Secondary | ICD-10-CM | POA: Diagnosis not present

## 2018-10-28 DIAGNOSIS — I69398 Other sequelae of cerebral infarction: Secondary | ICD-10-CM | POA: Diagnosis not present

## 2018-10-28 DIAGNOSIS — I272 Pulmonary hypertension, unspecified: Secondary | ICD-10-CM | POA: Diagnosis not present

## 2018-10-28 DIAGNOSIS — J449 Chronic obstructive pulmonary disease, unspecified: Secondary | ICD-10-CM | POA: Diagnosis not present

## 2018-11-11 DIAGNOSIS — R7981 Abnormal blood-gas level: Secondary | ICD-10-CM | POA: Diagnosis not present

## 2018-11-11 DIAGNOSIS — R600 Localized edema: Secondary | ICD-10-CM | POA: Diagnosis not present

## 2018-11-11 DIAGNOSIS — R0602 Shortness of breath: Secondary | ICD-10-CM | POA: Diagnosis not present

## 2018-11-12 ENCOUNTER — Telehealth: Payer: Self-pay | Admitting: General Surgery

## 2018-11-12 NOTE — Telephone Encounter (Signed)
Yes d/t cough and rash. We can then get her test and she can return in office with dr. Melvyn Novas

## 2018-11-12 NOTE — Telephone Encounter (Signed)
Called the patient and made her aware. Patient voiced understanding. Nothing further needed at this time.

## 2018-11-13 ENCOUNTER — Telehealth: Payer: Self-pay | Admitting: Pulmonary Disease

## 2018-11-13 ENCOUNTER — Other Ambulatory Visit: Payer: Self-pay

## 2018-11-13 ENCOUNTER — Emergency Department (HOSPITAL_COMMUNITY): Payer: PPO

## 2018-11-13 ENCOUNTER — Encounter (HOSPITAL_COMMUNITY): Payer: Self-pay

## 2018-11-13 ENCOUNTER — Encounter: Payer: Self-pay | Admitting: Primary Care

## 2018-11-13 ENCOUNTER — Other Ambulatory Visit: Payer: Self-pay | Admitting: General Surgery

## 2018-11-13 ENCOUNTER — Inpatient Hospital Stay (HOSPITAL_COMMUNITY)
Admission: EM | Admit: 2018-11-13 | Discharge: 2018-11-24 | DRG: 286 | Disposition: A | Discharge status: Home Health | Payer: PPO | Attending: Internal Medicine | Admitting: Internal Medicine

## 2018-11-13 ENCOUNTER — Ambulatory Visit (INDEPENDENT_AMBULATORY_CARE_PROVIDER_SITE_OTHER): Payer: PPO | Admitting: Primary Care

## 2018-11-13 ENCOUNTER — Encounter: Payer: Self-pay | Admitting: General Surgery

## 2018-11-13 ENCOUNTER — Ambulatory Visit (INDEPENDENT_AMBULATORY_CARE_PROVIDER_SITE_OTHER): Payer: PPO

## 2018-11-13 DIAGNOSIS — F419 Anxiety disorder, unspecified: Secondary | ICD-10-CM | POA: Diagnosis present

## 2018-11-13 DIAGNOSIS — Z8673 Personal history of transient ischemic attack (TIA), and cerebral infarction without residual deficits: Secondary | ICD-10-CM

## 2018-11-13 DIAGNOSIS — Z87891 Personal history of nicotine dependence: Secondary | ICD-10-CM

## 2018-11-13 DIAGNOSIS — E119 Type 2 diabetes mellitus without complications: Secondary | ICD-10-CM

## 2018-11-13 DIAGNOSIS — I313 Pericardial effusion (noninflammatory): Secondary | ICD-10-CM | POA: Diagnosis not present

## 2018-11-13 DIAGNOSIS — I110 Hypertensive heart disease with heart failure: Secondary | ICD-10-CM | POA: Diagnosis not present

## 2018-11-13 DIAGNOSIS — I509 Heart failure, unspecified: Secondary | ICD-10-CM | POA: Diagnosis not present

## 2018-11-13 DIAGNOSIS — Z87442 Personal history of urinary calculi: Secondary | ICD-10-CM

## 2018-11-13 DIAGNOSIS — I6529 Occlusion and stenosis of unspecified carotid artery: Secondary | ICD-10-CM | POA: Diagnosis not present

## 2018-11-13 DIAGNOSIS — K219 Gastro-esophageal reflux disease without esophagitis: Secondary | ICD-10-CM | POA: Diagnosis not present

## 2018-11-13 DIAGNOSIS — J90 Pleural effusion, not elsewhere classified: Secondary | ICD-10-CM | POA: Diagnosis not present

## 2018-11-13 DIAGNOSIS — L03116 Cellulitis of left lower limb: Secondary | ICD-10-CM | POA: Diagnosis present

## 2018-11-13 DIAGNOSIS — I200 Unstable angina: Secondary | ICD-10-CM

## 2018-11-13 DIAGNOSIS — Z7982 Long term (current) use of aspirin: Secondary | ICD-10-CM

## 2018-11-13 DIAGNOSIS — Z90710 Acquired absence of both cervix and uterus: Secondary | ICD-10-CM

## 2018-11-13 DIAGNOSIS — J9621 Acute and chronic respiratory failure with hypoxia: Secondary | ICD-10-CM | POA: Diagnosis present

## 2018-11-13 DIAGNOSIS — I10 Essential (primary) hypertension: Secondary | ICD-10-CM | POA: Diagnosis present

## 2018-11-13 DIAGNOSIS — L03115 Cellulitis of right lower limb: Secondary | ICD-10-CM | POA: Diagnosis not present

## 2018-11-13 DIAGNOSIS — E873 Alkalosis: Secondary | ICD-10-CM | POA: Diagnosis present

## 2018-11-13 DIAGNOSIS — Z823 Family history of stroke: Secondary | ICD-10-CM

## 2018-11-13 DIAGNOSIS — I5033 Acute on chronic diastolic (congestive) heart failure: Secondary | ICD-10-CM | POA: Diagnosis not present

## 2018-11-13 DIAGNOSIS — Z8249 Family history of ischemic heart disease and other diseases of the circulatory system: Secondary | ICD-10-CM

## 2018-11-13 DIAGNOSIS — I2510 Atherosclerotic heart disease of native coronary artery without angina pectoris: Secondary | ICD-10-CM | POA: Diagnosis not present

## 2018-11-13 DIAGNOSIS — Z902 Acquired absence of lung [part of]: Secondary | ICD-10-CM

## 2018-11-13 DIAGNOSIS — J449 Chronic obstructive pulmonary disease, unspecified: Secondary | ICD-10-CM | POA: Diagnosis present

## 2018-11-13 DIAGNOSIS — I25110 Atherosclerotic heart disease of native coronary artery with unstable angina pectoris: Secondary | ICD-10-CM | POA: Diagnosis present

## 2018-11-13 DIAGNOSIS — Z8711 Personal history of peptic ulcer disease: Secondary | ICD-10-CM

## 2018-11-13 DIAGNOSIS — E785 Hyperlipidemia, unspecified: Secondary | ICD-10-CM | POA: Diagnosis not present

## 2018-11-13 DIAGNOSIS — I5043 Acute on chronic combined systolic (congestive) and diastolic (congestive) heart failure: Secondary | ICD-10-CM | POA: Diagnosis present

## 2018-11-13 DIAGNOSIS — Z9851 Tubal ligation status: Secondary | ICD-10-CM

## 2018-11-13 DIAGNOSIS — G4733 Obstructive sleep apnea (adult) (pediatric): Secondary | ICD-10-CM | POA: Diagnosis present

## 2018-11-13 DIAGNOSIS — R0602 Shortness of breath: Secondary | ICD-10-CM

## 2018-11-13 DIAGNOSIS — E1151 Type 2 diabetes mellitus with diabetic peripheral angiopathy without gangrene: Secondary | ICD-10-CM | POA: Diagnosis present

## 2018-11-13 DIAGNOSIS — E1169 Type 2 diabetes mellitus with other specified complication: Secondary | ICD-10-CM | POA: Diagnosis not present

## 2018-11-13 DIAGNOSIS — I739 Peripheral vascular disease, unspecified: Secondary | ICD-10-CM | POA: Diagnosis not present

## 2018-11-13 DIAGNOSIS — I272 Pulmonary hypertension, unspecified: Secondary | ICD-10-CM | POA: Diagnosis present

## 2018-11-13 DIAGNOSIS — Z794 Long term (current) use of insulin: Secondary | ICD-10-CM

## 2018-11-13 DIAGNOSIS — I959 Hypotension, unspecified: Secondary | ICD-10-CM | POA: Diagnosis not present

## 2018-11-13 DIAGNOSIS — Z20828 Contact with and (suspected) exposure to other viral communicable diseases: Secondary | ICD-10-CM | POA: Diagnosis not present

## 2018-11-13 DIAGNOSIS — C3492 Malignant neoplasm of unspecified part of left bronchus or lung: Secondary | ICD-10-CM | POA: Diagnosis not present

## 2018-11-13 DIAGNOSIS — G4700 Insomnia, unspecified: Secondary | ICD-10-CM | POA: Diagnosis not present

## 2018-11-13 DIAGNOSIS — E876 Hypokalemia: Secondary | ICD-10-CM | POA: Diagnosis not present

## 2018-11-13 DIAGNOSIS — Z79899 Other long term (current) drug therapy: Secondary | ICD-10-CM

## 2018-11-13 DIAGNOSIS — Z9981 Dependence on supplemental oxygen: Secondary | ICD-10-CM

## 2018-11-13 DIAGNOSIS — Z20822 Contact with and (suspected) exposure to covid-19: Secondary | ICD-10-CM

## 2018-11-13 DIAGNOSIS — Z8619 Personal history of other infectious and parasitic diseases: Secondary | ICD-10-CM

## 2018-11-13 DIAGNOSIS — R6889 Other general symptoms and signs: Secondary | ICD-10-CM | POA: Diagnosis not present

## 2018-11-13 DIAGNOSIS — F329 Major depressive disorder, single episode, unspecified: Secondary | ICD-10-CM | POA: Diagnosis present

## 2018-11-13 DIAGNOSIS — F40240 Claustrophobia: Secondary | ICD-10-CM | POA: Diagnosis present

## 2018-11-13 DIAGNOSIS — Z7902 Long term (current) use of antithrombotics/antiplatelets: Secondary | ICD-10-CM

## 2018-11-13 DIAGNOSIS — C3432 Malignant neoplasm of lower lobe, left bronchus or lung: Secondary | ICD-10-CM | POA: Diagnosis not present

## 2018-11-13 DIAGNOSIS — E1159 Type 2 diabetes mellitus with other circulatory complications: Secondary | ICD-10-CM | POA: Diagnosis present

## 2018-11-13 DIAGNOSIS — E1142 Type 2 diabetes mellitus with diabetic polyneuropathy: Secondary | ICD-10-CM | POA: Diagnosis present

## 2018-11-13 DIAGNOSIS — R0902 Hypoxemia: Secondary | ICD-10-CM | POA: Diagnosis not present

## 2018-11-13 LAB — CBC WITH DIFFERENTIAL/PLATELET
Basophils Absolute: 0 10*3/uL (ref 0.0–0.1)
Basophils Relative: 0.4 % (ref 0.0–3.0)
Eosinophils Absolute: 0.1 10*3/uL (ref 0.0–0.7)
Eosinophils Relative: 1.3 % (ref 0.0–5.0)
HCT: 38.6 % (ref 36.0–46.0)
Hemoglobin: 11.9 g/dL — ABNORMAL LOW (ref 12.0–15.0)
Lymphocytes Relative: 9.1 % — ABNORMAL LOW (ref 12.0–46.0)
Lymphs Abs: 0.7 10*3/uL (ref 0.7–4.0)
MCHC: 30.8 g/dL (ref 30.0–36.0)
MCV: 85.3 fl (ref 78.0–100.0)
Monocytes Absolute: 0.6 10*3/uL (ref 0.1–1.0)
Monocytes Relative: 8 % (ref 3.0–12.0)
Neutro Abs: 6 10*3/uL (ref 1.4–7.7)
Neutrophils Relative %: 81.2 % — ABNORMAL HIGH (ref 43.0–77.0)
Platelets: 271 10*3/uL (ref 150.0–400.0)
RBC: 4.53 Mil/uL (ref 3.87–5.11)
RDW: 14.7 % (ref 11.5–15.5)
WBC: 7.4 10*3/uL (ref 4.0–10.5)

## 2018-11-13 LAB — CBC
HCT: 42.1 % (ref 36.0–46.0)
Hemoglobin: 12.1 g/dL (ref 12.0–15.0)
MCH: 25.9 pg — ABNORMAL LOW (ref 26.0–34.0)
MCHC: 28.7 g/dL — ABNORMAL LOW (ref 30.0–36.0)
MCV: 90 fL (ref 80.0–100.0)
Platelets: 266 10*3/uL (ref 150–400)
RBC: 4.68 MIL/uL (ref 3.87–5.11)
RDW: 13.8 % (ref 11.5–15.5)
WBC: 7.6 10*3/uL (ref 4.0–10.5)
nRBC: 0 % (ref 0.0–0.2)

## 2018-11-13 LAB — BASIC METABOLIC PANEL
BUN: 19 mg/dL (ref 6–23)
CO2: 39 mEq/L — ABNORMAL HIGH (ref 19–32)
Calcium: 9.3 mg/dL (ref 8.4–10.5)
Chloride: 93 mEq/L — ABNORMAL LOW (ref 96–112)
Creatinine, Ser: 0.91 mg/dL (ref 0.40–1.20)
GFR: 59.78 mL/min — ABNORMAL LOW (ref >60.00)
Glucose, Bld: 145 mg/dL — ABNORMAL HIGH (ref 70–99)
Potassium: 3.6 mEq/L (ref 3.5–5.1)
Sodium: 141 mEq/L (ref 135–145)

## 2018-11-13 LAB — GLUCOSE, CAPILLARY: Glucose-Capillary: 160 mg/dL — ABNORMAL HIGH (ref 70–99)

## 2018-11-13 LAB — CREATININE, SERUM
Creatinine, Ser: 0.74 mg/dL (ref 0.44–1.00)
GFR calc Af Amer: >60 mL/min (ref >60)
GFR calc non Af Amer: >60 mL/min (ref >60)

## 2018-11-13 LAB — BRAIN NATRIURETIC PEPTIDE: Pro B Natriuretic peptide (BNP): 1147 pg/mL — ABNORMAL HIGH (ref 0.0–100.0)

## 2018-11-13 LAB — SARS CORONAVIRUS 2 BY RT PCR (HOSPITAL ORDER, PERFORMED IN ~~LOC~~ HOSPITAL LAB): SARS Coronavirus 2: NEGATIVE

## 2018-11-13 MED ORDER — TRELEGY ELLIPTA 100-62.5-25 MCG/INH IN AEPB: 1.0000 | INHALATION_SPRAY | Freq: Every day | RESPIRATORY_TRACT | 0 refills | Status: DC

## 2018-11-13 MED ORDER — PANTOPRAZOLE SODIUM 40 MG PO TBEC
40.0000 mg | DELAYED_RELEASE_TABLET | Freq: Two times a day (BID) | ORAL | Status: DC
  Administered 2018-11-14 – 2018-11-24 (×22): 40 mg via ORAL
  Filled 2018-11-13 (×22): qty 1

## 2018-11-13 MED ORDER — ALBUTEROL SULFATE (2.5 MG/3ML) 0.083% IN NEBU: 2.5000 mg | INHALATION_SOLUTION | Freq: Four times a day (QID) | RESPIRATORY_TRACT | 1 refills | Status: DC | PRN

## 2018-11-13 MED ORDER — CLOPIDOGREL BISULFATE 75 MG PO TABS
75.0000 mg | ORAL_TABLET | Freq: Every day | ORAL | Status: DC
  Administered 2018-11-14 – 2018-11-24 (×11): 75 mg via ORAL
  Filled 2018-11-13 (×11): qty 1

## 2018-11-13 MED ORDER — METOPROLOL TARTRATE 25 MG PO TABS
50.0000 mg | ORAL_TABLET | Freq: Two times a day (BID) | ORAL | Status: DC
  Administered 2018-11-14 – 2018-11-18 (×8): 50 mg via ORAL
  Filled 2018-11-13 (×9): qty 1

## 2018-11-13 MED ORDER — UMECLIDINIUM-VILANTEROL 62.5-25 MCG/INH IN AEPB
1.0000 | INHALATION_SPRAY | Freq: Every day | RESPIRATORY_TRACT | Status: DC
  Administered 2018-11-14: 1 via RESPIRATORY_TRACT
  Filled 2018-11-13: qty 14

## 2018-11-13 MED ORDER — ALBUTEROL SULFATE (2.5 MG/3ML) 0.083% IN NEBU
2.5000 mg | INHALATION_SOLUTION | Freq: Four times a day (QID) | RESPIRATORY_TRACT | Status: DC | PRN
  Administered 2018-11-14: 2.5 mg via RESPIRATORY_TRACT
  Filled 2018-11-13 (×2): qty 3

## 2018-11-13 MED ORDER — LISINOPRIL 10 MG PO TABS
10.0000 mg | ORAL_TABLET | Freq: Every day | ORAL | Status: DC
  Administered 2018-11-14 – 2018-11-18 (×4): 10 mg via ORAL
  Filled 2018-11-13 (×5): qty 1

## 2018-11-13 MED ORDER — ACETAMINOPHEN 325 MG PO TABS
650.0000 mg | ORAL_TABLET | ORAL | Status: DC | PRN
  Administered 2018-11-14 – 2018-11-17 (×4): 650 mg via ORAL
  Filled 2018-11-13 (×6): qty 2

## 2018-11-13 MED ORDER — INSULIN GLARGINE 100 UNIT/ML ~~LOC~~ SOLN
8.0000 [IU] | Freq: Every day | SUBCUTANEOUS | Status: DC
  Administered 2018-11-14 – 2018-11-23 (×10): 8 [IU] via SUBCUTANEOUS
  Filled 2018-11-13 (×12): qty 0.08

## 2018-11-13 MED ORDER — DIAZEPAM 5 MG PO TABS
5.0000 mg | ORAL_TABLET | Freq: Three times a day (TID) | ORAL | Status: DC | PRN
  Administered 2018-11-20: 5 mg via ORAL
  Filled 2018-11-13: qty 1

## 2018-11-13 MED ORDER — ONDANSETRON HCL 4 MG/2ML IJ SOLN
4.0000 mg | Freq: Four times a day (QID) | INTRAMUSCULAR | Status: DC | PRN
  Administered 2018-11-14 – 2018-11-23 (×4): 4 mg via INTRAVENOUS
  Filled 2018-11-13 (×5): qty 2

## 2018-11-13 MED ORDER — ONDANSETRON HCL 4 MG PO TABS
4.0000 mg | ORAL_TABLET | Freq: Every evening | ORAL | Status: DC | PRN
  Administered 2018-11-17 – 2018-11-20 (×3): 4 mg via ORAL
  Filled 2018-11-13 (×3): qty 1

## 2018-11-13 MED ORDER — SODIUM CHLORIDE 0.9% FLUSH
3.0000 mL | Freq: Two times a day (BID) | INTRAVENOUS | Status: DC
  Administered 2018-11-13 – 2018-11-23 (×18): 3 mL via INTRAVENOUS

## 2018-11-13 MED ORDER — HEPARIN SODIUM (PORCINE) 5000 UNIT/ML IJ SOLN
5000.0000 [IU] | Freq: Three times a day (TID) | INTRAMUSCULAR | Status: DC
  Administered 2018-11-14 – 2018-11-20 (×21): 5000 [IU] via SUBCUTANEOUS
  Filled 2018-11-13 (×21): qty 1

## 2018-11-13 MED ORDER — BUDESONIDE 0.25 MG/2ML IN SUSP
0.2500 mg | Freq: Two times a day (BID) | RESPIRATORY_TRACT | Status: DC
  Administered 2018-11-14 – 2018-11-24 (×21): 0.25 mg via RESPIRATORY_TRACT
  Filled 2018-11-13 (×21): qty 2

## 2018-11-13 MED ORDER — INSULIN ASPART 100 UNIT/ML ~~LOC~~ SOLN
0.0000 [IU] | Freq: Every day | SUBCUTANEOUS | Status: DC
  Administered 2018-11-17 – 2018-11-20 (×3): 2 [IU] via SUBCUTANEOUS
  Administered 2018-11-21 – 2018-11-23 (×2): 3 [IU] via SUBCUTANEOUS

## 2018-11-13 MED ORDER — FUROSEMIDE 10 MG/ML IJ SOLN
60.0000 mg | Freq: Once | INTRAMUSCULAR | Status: AC
  Administered 2018-11-13: 19:00:00 60 mg via INTRAVENOUS
  Filled 2018-11-13: qty 8

## 2018-11-13 MED ORDER — MECLIZINE HCL 25 MG PO TABS
12.5000 mg | ORAL_TABLET | Freq: Three times a day (TID) | ORAL | Status: DC | PRN
  Administered 2018-11-20 – 2018-11-21 (×2): 12.5 mg via ORAL
  Filled 2018-11-13 (×3): qty 1

## 2018-11-13 MED ORDER — ALBUTEROL SULFATE HFA 108 (90 BASE) MCG/ACT IN AERS: 2.0000 | INHALATION_SPRAY | Freq: Four times a day (QID) | RESPIRATORY_TRACT | Status: DC | PRN

## 2018-11-13 MED ORDER — HYDROCODONE-ACETAMINOPHEN 5-325 MG PO TABS
1.0000 | ORAL_TABLET | Freq: Two times a day (BID) | ORAL | Status: DC | PRN
  Administered 2018-11-14 (×2): 1 via ORAL
  Filled 2018-11-13 (×2): qty 1

## 2018-11-13 MED ORDER — INSULIN ASPART 100 UNIT/ML ~~LOC~~ SOLN
0.0000 [IU] | Freq: Three times a day (TID) | SUBCUTANEOUS | Status: DC
  Administered 2018-11-14: 2 [IU] via SUBCUTANEOUS
  Administered 2018-11-14 (×2): 1 [IU] via SUBCUTANEOUS
  Administered 2018-11-15: 2 [IU] via SUBCUTANEOUS
  Administered 2018-11-15 – 2018-11-16 (×2): 1 [IU] via SUBCUTANEOUS
  Administered 2018-11-16 (×2): 2 [IU] via SUBCUTANEOUS
  Administered 2018-11-17 (×2): 1 [IU] via SUBCUTANEOUS
  Administered 2018-11-17: 3 [IU] via SUBCUTANEOUS
  Administered 2018-11-18: 1 [IU] via SUBCUTANEOUS
  Administered 2018-11-18: 3 [IU] via SUBCUTANEOUS
  Administered 2018-11-19 (×2): 2 [IU] via SUBCUTANEOUS
  Administered 2018-11-19 – 2018-11-20 (×2): 1 [IU] via SUBCUTANEOUS
  Administered 2018-11-20: 2 [IU] via SUBCUTANEOUS
  Administered 2018-11-20: 1 [IU] via SUBCUTANEOUS
  Administered 2018-11-21 (×2): 3 [IU] via SUBCUTANEOUS
  Administered 2018-11-21 – 2018-11-22 (×2): 1 [IU] via SUBCUTANEOUS
  Administered 2018-11-22: 3 [IU] via SUBCUTANEOUS
  Administered 2018-11-23: 06:00:00 1 [IU] via SUBCUTANEOUS
  Administered 2018-11-23: 2 [IU] via SUBCUTANEOUS
  Administered 2018-11-23 – 2018-11-24 (×2): 1 [IU] via SUBCUTANEOUS
  Administered 2018-11-24: 3 [IU] via SUBCUTANEOUS

## 2018-11-13 MED ORDER — MAGNESIUM OXIDE 400 (241.3 MG) MG PO TABS
200.0000 mg | ORAL_TABLET | Freq: Every day | ORAL | Status: DC
  Administered 2018-11-14 – 2018-11-23 (×10): 200 mg via ORAL
  Filled 2018-11-13 (×10): qty 1

## 2018-11-13 MED ORDER — OMEGA-3-ACID ETHYL ESTERS 1 G PO CAPS
1.0000 g | ORAL_CAPSULE | Freq: Every day | ORAL | Status: DC
  Administered 2018-11-14 – 2018-11-24 (×11): 1 g via ORAL
  Filled 2018-11-13 (×11): qty 1

## 2018-11-13 MED ORDER — PRAVASTATIN SODIUM 40 MG PO TABS
40.0000 mg | ORAL_TABLET | Freq: Every day | ORAL | Status: DC
  Administered 2018-11-14 – 2018-11-23 (×10): 40 mg via ORAL
  Filled 2018-11-13: qty 2
  Filled 2018-11-13 (×7): qty 1
  Filled 2018-11-13: qty 2
  Filled 2018-11-13: qty 1

## 2018-11-13 MED ORDER — FUROSEMIDE 10 MG/ML IJ SOLN
40.0000 mg | Freq: Two times a day (BID) | INTRAMUSCULAR | Status: DC
  Administered 2018-11-14 – 2018-11-15 (×3): 40 mg via INTRAVENOUS
  Filled 2018-11-13 (×3): qty 4

## 2018-11-13 MED ORDER — TRAZODONE HCL 50 MG PO TABS
50.0000 mg | ORAL_TABLET | Freq: Every day | ORAL | Status: DC
  Administered 2018-11-14 – 2018-11-23 (×11): 50 mg via ORAL
  Filled 2018-11-13 (×11): qty 1

## 2018-11-13 MED ORDER — CALCIUM CARBONATE 1250 (500 CA) MG PO TABS
1250.0000 mg | ORAL_TABLET | Freq: Every day | ORAL | Status: DC
  Administered 2018-11-14 – 2018-11-23 (×10): 1250 mg via ORAL
  Filled 2018-11-13 (×10): qty 1

## 2018-11-13 MED ORDER — SODIUM CHLORIDE 0.9 % IV SOLN: 250.0000 mL | INTRAVENOUS | Status: DC | PRN

## 2018-11-13 MED ORDER — SODIUM CHLORIDE 0.9% FLUSH: 3.0000 mL | INTRAVENOUS | Status: DC | PRN

## 2018-11-13 MED ORDER — DILTIAZEM HCL ER COATED BEADS 180 MG PO CP24
180.0000 mg | ORAL_CAPSULE | Freq: Every day | ORAL | Status: DC
  Administered 2018-11-14 – 2018-11-18 (×4): 180 mg via ORAL
  Filled 2018-11-13 (×5): qty 1

## 2018-11-13 MED ORDER — ASPIRIN 81 MG PO CHEW
81.0000 mg | CHEWABLE_TABLET | Freq: Every day | ORAL | Status: DC
  Administered 2018-11-14 – 2018-11-24 (×11): 81 mg via ORAL
  Filled 2018-11-13 (×11): qty 1

## 2018-11-13 NOTE — Telephone Encounter (Signed)
Message from Wyn Quaker in regards to results of pt's cxr:  [name], NPNurse PractitionerSigned 4:41 PM            I have looked at the chest x-ray.  Showing worsened right-sided pleural effusion.  Still showing bilateral pleural effusions.  Lab work also showing elevated proBNP.  Patient is retaining fluid.  I would recommend the patient present to an emergency room as previously suggested by EW earlier today.  If patient continues to refuse then we may need to consider increasing diuretics.  Preferably patient would present to [facility] long emergency room as Dr. Melvyn Novas is rounding there.  If patient continues to refuse then we will need to develop a different plan of care.  Wyn Quaker, FNP             Called and spoke with both pt and niece [name]. I stated to both of them the results of the cxr and stated that two of our providers Geraldo Pitter and Wyn Quaker both agree that she needs to present to the ER for further evaluation.  Stated to both of them that [name] is where she should go as MW is currently rounding over there and he could be able to come see her in the ER.  [name] verbalized understanding and stated she thought this is what was going to happen. She said that she was going to talk this over with pt and then would get her to the ER.  Routing to [employer], and Dr. Melvyn Novas.

## 2018-11-13 NOTE — Progress Notes (Signed)
Virtual Visit via Telephone Note  I connected with Caitlin Oliver on 11/13/18 at 11:45 AM EDT by telephone and verified that I am speaking with the correct person using two identifiers.  Location: Patient: Home Provider: Office   I discussed the limitations, risks, security and privacy concerns of performing an evaluation and management service by telephone and the availability of in person appointments. I also discussed with the patient that there may be a patient responsible charge related to this service. The patient expressed understanding and agreed to proceed.  History of Present Illness: 78 year old female, former smoker quit 2015 (84 pack year hx). PMH significant for COPD gold II, lung cancer left lung, solitary pulmonary nodules, hypoxia, CVA, bilateral carotid artery stenosis, CAD, HTN, PVD, GI bleed, C-diff, type 2 diabetes, vertigo, hyperlipidemia. Patient of Dr. Melvyn Novas, last seen on 02/05/17. Felt to be doing well, no new complaints. Did not noticed a difference on maintenance inhaler. Ok to stop Stiolto and add back if breathing worsens. Maintained on Albuterol hfa/nebulizer prn. Follow-up as needed.   Saw PCP on 7/28 for shortness of breath. Increased lasix to 21m until patient can be seen by pulmonary. O2 as low as 55% on 2L, advised to increase oxygen to 4L. She has not been able to be off oxygen since stoke in March. CTA on 06/18/18 showed no PE, enlarging bilateral pleural effusion, small of the left and trace on the right. Stable 630mnodules RLL. Cardiomegaly.  11/13/2018 Patient contacted today for televisit. Accompanied by niece on phone call. Reports worsening shortness of breath x 1 month. Associated headache, dizziness, somnolence, abdominal swelling and chest tightness. Taking lasix 2071maily, increased 27m73mr the last two days. Not on maintenance inhaler. Using albuterol hfa frequently. O2 88-90% on 2L. Shortness of breath has been progressively worsening and she has been  oxygen dependent since hospitalization in March. Niece states that she falls asleep during the day while sitting at the table. Denies fever, cough or wheezing. Patient is not wanting to go to hospital despite recommendations.   PFT's  12/15/15 - FEV1 1.92 (91 % ) ratio 72  p 8 % improvement from saba p stiolto prior to study with DLCO  69 % corrects to 80 % for alv volume    Observations/Objective:  - Awake/alert, answering all questions - No significant shortness of breath, cough or wheezing noted during televisit  Assessment and Plan:  Shortness of breath/Dyspnea - Worsening sob x 1 month - Continue lasix 27mg65mly until testing resulted  - Needs CXR to evaluate previous pleural effusion - Covid testing and labs (bnp, cbc and bmet) - Needs to follow up with cardiology   COPD - Clinical symptoms not consistent with COPD exacerbation - Stiolto not covered - Trial Trelegy 1 puff daily (sample given) - Add albuterol nebulizer q 6 hours prn sob - DME referral for incentive spirometer and new nebulizer machine   Acute on chronic respiratory failure - Increased somnolence - O2 88-90% 2L; desaturating to 50s at times  - Titrate oxygen 2-4 L to keep sat >88% - Strongly advised ED evaluation by EMS but patient declined  - Family understands if breathing symptoms worsen, O2 sustaining <88% or patient becomes more somnolent to call EMS   Somnolence: - Check ONO on 2L - May needs sleep study in the future - Checking BMET to evaluate CO2 - may need hospital admission for BIPAP    Follow Up Instructions:   5 days with Dr. WertMelvyn Novas  or NP  I discussed the assessment and treatment plan with the patient. The patient was provided an opportunity to ask questions and all were answered. The patient agreed with the plan and demonstrated an understanding of the instructions.   The patient was advised to call back or seek an in-person evaluation if the symptoms worsen or if the condition fails to  improve as anticipated.  I provided 35 minutes of non-face-to-face time during this encounter.   Martyn Ehrich, NP

## 2018-11-13 NOTE — ED Notes (Signed)
ED TO INPATIENT HANDOFF REPORT  Name/Age/Gender Caitlin Oliver 78 y.o. female  Code Status Code Status History    Date Active Date Inactive Code Status Order ID Comments User Context   06/16/2018 2243 06/20/2018 1835 Full Code [id number]  Rise Patience, MD ED   02/03/2016 1625 02/16/2016 1917 Full Code [id number]  Nani Skillern, PA-C Inpatient   11/15/2015 1332 11/15/2015 2123 Full Code [id number]  Serafina Mitchell, MD Inpatient   10/18/2013 1848 10/23/2013 1633 DNR [id number]  Juanito Doom, MD Inpatient   07/27/2013 1645 07/29/2013 1719 Full Code [id number]  Lorretta Harp, MD Inpatient   05/07/2013 1457 05/08/2013 1447 Full Code [id number]  Ulyses Amor, PA-C Inpatient   03/06/2013 1344 03/07/2013 1446 Full Code [id number]  Richrd Prime, PA-C Inpatient   Advance Care Planning Activity    Advance Directive Documentation     Most Recent Value  Type of Advance Directive  Healthcare Power of Attorney  Pre-existing out of facility DNR order (yellow form or pink MOST form)  -  "MOST" Form in Place?  -      Home/SNF/Other Home  Chief Complaint SOB, Abd Pain, Swelling  Level of Care/Admitting Diagnosis ED Disposition    ED Disposition Condition [place]: [place] [[id number]]  Level of Care: Telemetry [5]  Admit to tele based on following criteria: Acute CHF  Covid Evaluation: Asymptomatic Screening Protocol (No Symptoms)  Diagnosis: Acute on chronic combined systolic and diastolic CHF (congestive heart failure) ([place]) [419379]  Admitting Physician: Elwyn Reach [[id number]]  Attending Physician: Elwyn Reach [[id number]]  Estimated length of stay: past midnight tomorrow  Certification:: I certify this patient will need inpatient services for at least 2 midnights  PT Class (Do Not Modify): Inpatient [101]  PT Acc Code (Do Not Modify): Private [1]       Medical History Past Medical History:  Diagnosis Date  . Anxiety   .  Carotid artery disease ([place])   . Claudication ([place])   . COPD (chronic obstructive pulmonary disease) ([place])   . Diabetes mellitus   . Dizzy   . GERD (gastroesophageal reflux disease)   . Headache   . History of kidney stones   . Hypercholesteremia   . Hypertension   . 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 Shortness of breath    with exertion  . Stomach ulcer   . Tobacco abuse     Allergies Allergies  Allergen Reactions  . No Known Allergies Other (See Comments)    IV Location/Drains/Wounds Patient Lines/Drains/Airways Status   Active Line/Drains/Airways    Name:   Placement date:   Placement time:   Site:   Days:   Peripheral IV 11/13/18 Left Wrist   11/13/18    1931    Wrist   less than 1   Incision 05/07/13 Neck Left   05/07/13    1044     2016   Incision (Closed) 12/23/15 Neck Left   12/23/15    1405     1056   Incision (Closed) 02/03/16 Back   02/03/16    1004     1014          Labs/Imaging Results for orders placed or performed in visit on 11/13/18 (from the past 48 hour(s))  Basic metabolic panel     Status: Abnormal   Collection Time: 11/13/18  2:34 PM  Result Value  Ref Range   Sodium 141 135 - 145 mEq/L   Potassium 3.6 3.5 - 5.1 mEq/L   Chloride 93 (L) 96 - 112 mEq/L   CO2 39 (H) 19 - 32 mEq/L   Glucose, Bld 145 (H) 70 - 99 mg/dL   BUN 19 6 - 23 mg/dL   Creatinine, Ser 0.91 0.40 - 1.20 mg/dL   Calcium 9.3 8.4 - 10.5 mg/dL   GFR 59.78 (L) >60.00 mL/min  CBC with Differential/Platelet     Status: Abnormal   Collection Time: 11/13/18  2:34 PM  Result Value Ref Range   WBC 7.4 4.0 - 10.5 K/uL   RBC 4.53 3.87 - 5.11 Mil/uL   Hemoglobin 11.9 (L) 12.0 - 15.0 g/dL   HCT 38.6 36.0 - 46.0 %   MCV 85.3 78.0 - 100.0 fl   MCHC 30.8 30.0 - 36.0 g/dL   RDW 14.7 11.5 - 15.5 %   Platelets 271.0 150.0 - 400.0 K/uL   Neutrophils Relative % 81.2 (H) 43.0 - 77.0 %   Lymphocytes Relative 9.1 (L) 12.0 - 46.0 %    Monocytes Relative 8.0 3.0 - 12.0 %   Eosinophils Relative 1.3 0.0 - 5.0 %   Basophils Relative 0.4 0.0 - 3.0 %   Neutro Abs 6.0 1.4 - 7.7 K/uL   Lymphs Abs 0.7 0.7 - 4.0 K/uL   Monocytes Absolute 0.6 0.1 - 1.0 K/uL   Eosinophils Absolute 0.1 0.0 - 0.7 K/uL   Basophils Absolute 0.0 0.0 - 0.1 K/uL  Brain natriuretic peptide     Status: Abnormal   Collection Time: 11/13/18  2:34 PM  Result Value Ref Range   Pro B Natriuretic peptide (BNP) 1,147.0 (H) 0.0 - 100.0 pg/mL   Dg Chest 2 View  Result Date: 11/13/2018 CLINICAL DATA:  Acute shortness of breath EXAM: CHEST - 2 VIEW COMPARISON:  06/18/2018 CT, 05/13/2018 chest radiograph and prior studies FINDINGS: Cardiomegaly and mild pulmonary vascular congestion noted. Small bilateral pleural effusions are noted, increased on the RIGHT and stable on the LEFT since 06/18/2018. Mild bibasilar atelectasis noted. No pneumothorax or acute bony abnormality. IMPRESSION: 1. Small bilateral pleural effusions, increased on the RIGHT and stable on the LEFT since 06/18/2018. Mild bibasilar atelectasis. 2. Cardiomegaly and mild pulmonary vascular congestion. Electronically Signed   By: Margarette Canada M.D.   On: 11/13/2018 16:23    Pending Labs Unresulted Labs (From admission, onward)    Start     Ordered   11/13/18 1944  SARS Coronavirus 2 (CEPHEID - Performed in [place] hospital lab), Hosp Order  (Asymptomatic Patients Labs)  Once,   STAT    Question:  Rule Out  Answer:  Yes   11/13/18 1943          Vitals/Pain Today's Vitals   11/13/18 1804 11/13/18 1900 11/13/18 1911 11/13/18 1929  BP:    (!) 155/101  Pulse:  77  83  Resp:  20  (!) 21  Temp:      TempSrc:      SpO2:  94%  96%  Weight: 84.8 kg     Height: _0  (1.626 m)     PainSc:   9      Isolation Precautions No active isolations  Medications Medications  furosemide (LASIX) injection 60 mg (60 mg Intravenous Given 11/13/18 1923)    Mobility walks

## 2018-11-13 NOTE — Patient Instructions (Addendum)
Orders: - Covid testing - Labs (BNP,CBC, BMET)  - CXR re: sob, pleural effusion  - ONO on 2L   Referral: - DME for Incentive spirometer  - Nebulizer machine   Rx: - Stiolto does not appear to be covered on insurance (if sample ok to give) - Other wise, try Trelegy 1 puff daily (sample)  Follow-up: - 5-7 day follow-up with Dr. Melvyn Novas if testing negative   Volanda Napoleon, NP if nothing available)   - Please go to ED if breathing symptoms worse, O2 sustaining <88% on oxygen or becomes more somnolent

## 2018-11-13 NOTE — Progress Notes (Signed)
I have looked at the chest x-ray.  Showing worsened right-sided pleural effusion.  Still showing bilateral pleural effusions.  Lab work also showing elevated proBNP.  Patient is retaining fluid.  I would recommend the patient present to an emergency room as previously suggested by EW earlier today.  If patient continues to refuse then we may need to consider increasing diuretics.  Preferably patient would present to [facility] long emergency room as Dr. Melvyn Novas is rounding there.  If patient continues to refuse then we will need to develop a different plan of care.  Wyn Quaker, FNP

## 2018-11-13 NOTE — ED Provider Notes (Signed)
[facility] DEPT Provider Note   CSN: [id number] Arrival date & time: 11/13/18  1753    History   Chief Complaint Chief Complaint  Patient presents with  . Shortness of Breath  . Leg Swelling  . Abdominal Pain    HPI Caitlin Oliver is a 78 y.o. female with a PMH of HFpEF, COPD Gold 2, lung cancer of the left lung, CVA, CAD, hypertension, type 2 diabetes who presents with shortness of breath.  She says that ever since she was hospitalized for her CVA in March 2020, she has been dependent on supplemental oxygen, usually 2 L.  Before this hospitalization, she did not need oxygen.  Over the past 2 weeks, she has had several desaturations into the 50s and into the 70s.  Her oxygen was turned up to 3 and 4 L, which did seem to help somewhat.  Over the last week, she has noticed that her pants were fitting more tightly around her abdomen and her legs were more edematous.  They have now begun weeping.  Her furosemide was increased from 20 mg daily to 40 mg daily about 2 days ago.  She has urinated more in response to this, but her shortness of breath and swelling have continued.  She denies fever and cough.  She denies chest pain.  She had a telemedicine visit with her pulmonologist earlier today who ordered a CXR, CBC, BMP, and BNP.  CXR was significant for bilateral pulmonary effusions without areas of consolidation and BNP was elevated to 1140.     Past Medical History:  Diagnosis Date  . Anxiety   . Carotid artery disease ([place])   . Claudication ([place])   . COPD (chronic obstructive pulmonary disease) ([place])   . Diabetes mellitus   . Dizzy   . GERD (gastroesophageal reflux disease)   . Headache   . History of kidney stones   . Hypercholesteremia   . Hypertension   . 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 Shortness of breath    with exertion  . Stomach ulcer   . Tobacco abuse     Patient Active Problem  List   Diagnosis Date Noted  . Acute CVA (cerebrovascular accident) ([place]) 06/17/2018  . Syncope 06/16/2018  . Chest pain 06/16/2018  . Pain 09/23/2017  . Cervicalgia 07/16/2017  . Pain in both upper extremities 06/03/2017  . Pain in both lower extremities 06/03/2017  . Bilateral carotid artery stenosis 06/03/2017  . Benign paroxysmal positional vertigo 10/23/2016  . Headache 06/19/2016  . Cancer of left lung ([place]) 02/03/2016  . COPD GOLD II 12/08/2015  . Solitary pulmonary nodule 12/08/2015  . Bilateral occipital neuralgia 11/22/2015  . Double vision 10/28/2015  . TIA (transient ischemic attack) 10/28/2015  . Subclavian artery stenosis, left ([place]) 10/28/2015  . Subclavian steal syndrome 10/28/2015  . Acute blood loss anemia 10/20/2013  . C. difficile colitis 10/19/2013  . GI bleed 10/18/2013  . COPD 10/18/2013  . Bleeding gastrointestinal 10/18/2013  . Hypoxia 10/10/2013  . Bronchitis 10/08/2013  . PVD (peripheral vascular disease) with claudication ([place]) 07/29/2013  . Carotid stenosis 05/07/2013  . Stenosis of left carotid artery 05/07/2013  . Occlusion and stenosis of carotid artery without mention of cerebral infarction 03/02/2013  . Carotid artery obstruction 03/02/2013  . Carotid artery disease ([place]) 02/27/2013  . Tobacco abuse 02/27/2013  . Compulsive tobacco user syndrome 02/27/2013  . Claudication ([place]) 01/20/2013  . Essential hypertension  01/20/2013  . Type 2 diabetes mellitus ([place]) 01/20/2013  . Hyperlipidemia 01/20/2013    Past Surgical History:  Procedure Laterality Date  . ABDOMINAL HYSTERECTOMY    . APPENDECTOMY    . BREAST REDUCTION SURGERY    . CAROTID ANGIOGRAM N/A 02/25/2013   Procedure: CAROTID ANGIOGRAM;  Surgeon: Lorretta Harp, MD;  Location: [facility] CATH LAB;  Service: Cardiovascular;  Laterality: N/A;  . ENDARTERECTOMY Right 03/06/2013   Procedure: ENDARTERECTOMY CAROTID-RIGHT;  Surgeon: Serafina Mitchell, MD;  Location: [facility];  Service: Vascular;   Laterality: Right;  . ENDARTERECTOMY Left 05/07/2013   Procedure: LEFT CAROTID ARTERY ENDARTERECTOMY WITH VASCU-GUARD PATCH ANGIOPLASTY ;  Surgeon: Serafina Mitchell, MD;  Location: [facility];  Service: Vascular;  Laterality: Left;  . LOBECTOMY Left 02/03/2016   Procedure: LEFT UPPER LOBECTOMY;  Surgeon: [name], MD;  Location: [facility];  Service: Thoracic;  Laterality: Left;  . LOWER EXTREMITY ANGIOGRAM N/A 02/25/2013   Procedure: LOWER EXTREMITY ANGIOGRAM;  Surgeon: Lorretta Harp, MD;  Location: [facility] CATH LAB;  Service: Cardiovascular;  Laterality: N/A;  . LOWER EXTREMITY ANGIOGRAM N/A 07/27/2013   Procedure: LOWER EXTREMITY ANGIOGRAM;  Surgeon: Lorretta Harp, MD;  Location: [facility] CATH LAB;  Service: Cardiovascular;  Laterality: N/A;  . PATCH ANGIOPLASTY Right 03/06/2013   Procedure: PATCH ANGIOPLASTY of Right Carotid Artery using Vascu-Guard Patch;  Surgeon: Serafina Mitchell, MD;  Location: [facility];  Service: Vascular;  Laterality: Right;  . PERIPHERAL VASCULAR CATHETERIZATION N/A 11/15/2015   Procedure: Aortic Arch Angiography;  Surgeon: Serafina Mitchell, MD;  Location: [facility] CV LAB;  Service: Cardiovascular;  Laterality: N/A;  . PERIPHERAL VASCULAR CATHETERIZATION Bilateral 11/15/2015   Procedure: Carotid Angiography;  Surgeon: Serafina Mitchell, MD;  Location: [facility] CV LAB;  Service: Cardiovascular;  Laterality: Bilateral;  . PERIPHERAL VASCULAR CATHETERIZATION Left 11/15/2015   Procedure: Upper Extremity Angiography;  Surgeon: Serafina Mitchell, MD;  Location: [facility] CV LAB;  Service: Cardiovascular;  Laterality: Left;  . PERIPHERAL VASCULAR CATHETERIZATION Left 11/15/2015   Procedure: Peripheral Vascular Intervention;  Surgeon: Serafina Mitchell, MD;  Location: [facility] CV LAB;  Service: Cardiovascular;  Laterality: Left;  subclavian   . SALIVARY GLAND SURGERY     scar tissue removed from left saliva glad  . TUBAL LIGATION    . VIDEO ASSISTED THORACOSCOPY (VATS)/WEDGE RESECTION  Left 02/03/2016   Procedure: VIDEO ASSISTED THORACOSCOPY;  Surgeon: [name], MD;  Location: [facility];  Service: Thoracic;  Laterality: Left;     OB History   No obstetric history on file.      Home Medications    Prior to Admission medications   Medication Sig Start Date End Date Taking? Authorizing Provider  albuterol (PROVENTIL HFA;VENTOLIN HFA) 108 (90 Base) MCG/ACT inhaler Inhale 2 puffs into the lungs every 6 (six) hours as needed for wheezing or shortness of breath.  07/11/17   [provider]  albuterol (PROVENTIL) (2.5 MG/3ML) 0.083% nebulizer solution Take 3 mLs (2.5 mg total) by nebulization every 6 (six) hours as needed for wheezing or shortness of breath. 11/13/18   Martyn Ehrich, NP  aspirin 81 MG chewable tablet Chew by mouth daily.    [provider]  BIOTIN 5000 PO Take 5,000 mg by mouth daily before supper.     [provider]  calcium carbonate (CALCIUM 600) 600 MG TABS tablet Take 600 mg by mouth daily before supper.     [provider]  cloNIDine (CATAPRES)  0.1 MG tablet Take 0.1 mg by mouth 2 (two) times daily.    [provider]  clopidogrel (PLAVIX) 75 MG tablet Take 1 tablet (75 mg total) by mouth daily. 06/20/18   Thurnell Lose, MD  diazepam (VALIUM) 2 MG tablet Take 5 mg by mouth every 8 (eight) hours as needed for anxiety.     [provider]  diltiazem (DILACOR XR) 180 MG 24 hr capsule Take 180 mg by mouth daily.  07/16/16   [provider]  Fluticasone-Umeclidin-Vilant (TRELEGY ELLIPTA) 100-62.5-25 MCG/INH AEPB Inhale 1 puff into the lungs daily. 11/13/18   Martyn Ehrich, NP  furosemide (LASIX) 20 MG tablet Take 20 mg by mouth daily.     [provider]  gabapentin (NEURONTIN) 100 MG capsule Take 100 mg by mouth 4 (four) times daily.    [provider]  HYDROcodone-acetaminophen (NORCO/VICODIN) 5-325 MG tablet Take 1 tablet by mouth every 12 (twelve) hours as needed  for moderate pain. 06/20/18   Thurnell Lose, MD  insulin aspart (NOVOLOG) 100 UNIT/ML injection Substitute to any brand approved.Before each meal 3 times a day, 140-199 - 2 units, 200-250 - 4 units, 251-299 - 6 units,  300-349 - 8 units,  350 or above 10 units. Dispense syringes and needles as needed, Ok to switch to PEN if approved. DX DM2, Code E11.65 Patient not taking: Reported on 11/13/2018 06/20/18   Thurnell Lose, MD  insulin glargine (LANTUS) 100 UNIT/ML injection Inject 0.08 mLs (8 Units total) into the skin at bedtime. Dispense insulin pen if approved, if not dispense as needed syringes and needles for 1 month supply. Can switch to Levemir. Diagnosis E 11.65. Patient not taking: Reported on 10/03/2018 06/20/18   Thurnell Lose, MD  lisinopril (ZESTRIL) 10 MG tablet Take 10 mg by mouth daily.    [provider]  MAGNESIUM PO Take 1 tablet by mouth daily before supper.    [provider]  meclizine (ANTIVERT) 12.5 MG tablet TAKE ONE TABLET BY MOUTH THREE TIMES DAILY AS NEEDED FOR dizziness 09/30/18   [provider]  Menthol, Topical Analgesic, (ICY HOT EX) Apply 1 spray topically every 8 (eight) hours as needed (pain).    [provider]  metFORMIN (GLUCOPHAGE) 500 MG tablet Take by mouth 2 (two) times daily with a meal.    [provider]  metoprolol tartrate (LOPRESSOR) 50 MG tablet Take 1 tablet (50 mg total) by mouth 2 (two) times daily with a meal. 06/20/18   Thurnell Lose, MD  Omega-3 Fatty Acids (FISH OIL PO) Take 1 capsule by mouth daily.    [provider]  ondansetron (ZOFRAN) 4 MG tablet Take 4 mg by mouth at bedtime as needed for nausea or vomiting.  06/05/18   [provider]  ONE TOUCH ULTRA TEST test strip  09/12/16   [provider]  pantoprazole (PROTONIX) 40 MG tablet Take 40 mg by mouth 2 (two) times daily.  12/17/13   [provider]  pravastatin (PRAVACHOL) 40 MG tablet Take 40 mg by mouth  daily before supper.     [provider]  traZODone (DESYREL) 50 MG tablet Take 50 mg by mouth at bedtime.    [provider]    Family History Family History  Problem Relation Age of Onset  . Stroke Mother   . Hypertension Mother   . Kidney failure Father     Social History Social History   Tobacco Use  .  Smoking status: Former Smoker    Packs/day: 1.50    Years: 56.00    Pack years: 84.00    Types: Cigarettes    Quit date: 07/07/2013    Years since quitting: 5.3  . Smokeless tobacco: Former Systems developer    Quit date: 02/25/2013  Substance Use Topics  . Alcohol use: No  . Drug use: No     Allergies   No known allergies   Review of Systems Review of Systems  Constitutional: Positive for activity change. Negative for appetite change, fever and unexpected weight change.  HENT: Negative for congestion and rhinorrhea.   Respiratory: Positive for chest tightness and shortness of breath. Negative for cough.   Cardiovascular: Positive for leg swelling. Negative for chest pain.  Gastrointestinal: Positive for abdominal distention, abdominal pain and constipation.  Genitourinary: Negative for dysuria.  Musculoskeletal: Negative for back pain.  Neurological: Negative for headaches.  Psychiatric/Behavioral: The patient is not nervous/anxious.      Physical Exam Updated Vital Signs BP (!) 155/101   Pulse 83   Temp 98.4 F (36.9 C) (Oral)   Resp (!) 21   Ht _0  (1.626 m)   Wt 84.8 kg   SpO2 96%   BMI 32.10 kg/m   Physical Exam Constitutional:      General: She is not in acute distress. HENT:     Head: Normocephalic and atraumatic.     Mouth/Throat:     Mouth: Mucous membranes are moist.  Eyes:     Extraocular Movements: Extraocular movements intact.  Neck:     Musculoskeletal: Normal range of motion.  Cardiovascular:     Rate and Rhythm: Normal rate and regular rhythm.     Heart sounds: Normal heart sounds.  Pulmonary:     Effort: Tachypnea  present.     Breath sounds: Examination of the right-lower field reveals decreased breath sounds. Examination of the left-lower field reveals decreased breath sounds. Decreased breath sounds present.  Abdominal:     General: Bowel sounds are normal.     Palpations: Abdomen is soft.  Musculoskeletal: Normal range of motion.     Right lower leg: Edema (weeping) present.     Left lower leg: Edema (weeping) present.  Skin:    General: Skin is warm and dry.  Neurological:     General: No focal deficit present.     Mental Status: She is alert. She is disoriented.  Psychiatric:        Mood and Affect: Mood normal.        Behavior: Behavior normal.      ED Treatments / Results  Labs (all labs ordered are listed, but only abnormal results are displayed) Labs Reviewed  SARS CORONAVIRUS 2 (HOSPITAL ORDER, [place] [facility])    EKG EKG Interpretation  Date/Time:  Thursday November 13 2018 18:05:10 EDT Ventricular Rate:  86 PR Interval:    QRS Duration: 83 QT Interval:  372 QTC Calculation: 445 R Axis:   95 Text Interpretation:  Sinus rhythm Right axis deviation Borderline repolarization abnormality Artifact Abnormal ECG Confirmed by Carmin Muskrat [phone]) on 11/13/2018 6:51:25 PM   Radiology Dg Chest 2 View  Result Date: 11/13/2018 CLINICAL DATA:  Acute shortness of breath EXAM: CHEST - 2 VIEW COMPARISON:  06/18/2018 CT, 05/13/2018 chest radiograph and prior studies FINDINGS: Cardiomegaly and mild pulmonary vascular congestion noted. Small bilateral pleural effusions are noted, increased on the RIGHT and stable on the LEFT since 06/18/2018. Mild bibasilar atelectasis noted. No  pneumothorax or acute bony abnormality. IMPRESSION: 1. Small bilateral pleural effusions, increased on the RIGHT and stable on the LEFT since 06/18/2018. Mild bibasilar atelectasis. 2. Cardiomegaly and mild pulmonary vascular congestion. Electronically Signed   By: Margarette Canada M.D.   On:  11/13/2018 16:23    Procedures Procedures (including critical care time)  Medications Ordered in ED Medications  furosemide (LASIX) injection 60 mg (60 mg Intravenous Given 11/13/18 1923)     Initial Impression / Assessment and Plan / ED Course  I have reviewed the triage vital signs and the nursing notes.  Pertinent labs & imaging results that were available during my care of the patient were reviewed by me and considered in my medical decision making (see chart for details).  Patient's CXR and BNP as well as her history and physical exam are consistent with a CHF exacerbation.  She was given furosemide 60 mg IV with frequent urine output in the ED.  Due to patient's continued tachypnea and level of volume overload, she was felt to be appropriate for admission for a CHF exacerbation.  She and her niece were agreeable to this plan. Final Clinical Impressions(s) / ED Diagnoses   Final diagnoses:  Acute on chronic congestive heart failure, unspecified heart failure type [facility])    ED Discharge Orders    None       Kathrene Alu, MD 11/13/18 2055    Carmin Muskrat, MD 11/13/18 2126

## 2018-11-13 NOTE — Telephone Encounter (Signed)
Disregard previous message.  There was a lag in chart being refreshed.  Patient has already completed chest x-ray as the results was have not been finally read by radiology.  Patient also has had lab work done.  We will follow-up with the patient once these labs and x-rays are finally resulted.  Will route to update triage but nothing further is needed at this time.  We will also route to EW and Dr. Melvyn Novas as Juluis Rainier that patient has pending labs and x-ray waiting for result to further evaluate shortness of breath.  Sorry for any confusion.  Wyn Quaker FNP

## 2018-11-13 NOTE — H&P (Signed)
History and Physical   Caitlin Oliver IPJ:[id number] DOB: [birth year] DOA: 11/13/2018  Referring MD/NP/PA: Dr. Vanita Panda  PCP: [name], PA-C   Outpatient Specialists: Donnella Bi, cardiology  Patient coming from: Home  Chief Complaint: Shortness of breath  HPI: Caitlin Oliver is a 78 y.o. female with medical history significant of advanced COPD followed by pulmonology, diastolic dysfunction CHF with EF in March of this year showing 60%, anxiety disorder, peripheral vascular disease, diabetes, GERD, hyperlipidemia, hypertension who presented to the ER with progressive shortness of breath leg swelling and cough.  Patient had CVA in March of this year.  She also has had lung cancer as well as coronary artery disease.  All these have been under control.  She has been on 2 L of oxygen at home since March of this year.  Patient has noticed worsening shortness of breath in the last 2 weeks including oxygen dropping into the 70s saturation.  Also mainly exertional.  Some PND and orthopnea.  She was seen in the outpatient setting was on 20 mg of Lasix which was increased to 40 mg 2 days ago.  She had increased urination but still continues to have more shortness of breath and leg swelling.  She came to the ER where she was evaluated.  Patient has evidence of fluid overload and is being admitted with a diagnosis of acute exacerbation of diastolic CHF.[name]  ED Course: Temperature is 98.4 blood pressure 155/101 pulse 89 respiratory of 26 oxygen sats 92% on room air.  White count 7.4 hemoglobin 11.9 and platelets of 271.  Chemistry appears to be normal except for CO2 of 39 chloride 93.  COVID-19 testing is negative.  Chest x-ray shows small bilateral pleural effusions more on the right.  Cardiomegaly and mild pulmonary vascular congestion.  Review of Systems: As per HPI otherwise 10 point review of systems negative.    Past Medical History:  Diagnosis Date  . Anxiety   . Carotid artery disease  ([place])   . Claudication ([place])   . COPD (chronic obstructive pulmonary disease) ([place])   . Diabetes mellitus   . Dizzy   . GERD (gastroesophageal reflux disease)   . Headache   . History of kidney stones   . Hypercholesteremia   . Hypertension   . 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 Shortness of breath    with exertion  . Stomach ulcer   . Tobacco abuse     Past Surgical History:  Procedure Laterality Date  . ABDOMINAL HYSTERECTOMY    . APPENDECTOMY    . BREAST REDUCTION SURGERY    . CAROTID ANGIOGRAM N/A 02/25/2013   Procedure: CAROTID ANGIOGRAM;  Surgeon: Lorretta Harp, MD;  Location: [facility] CATH LAB;  Service: Cardiovascular;  Laterality: N/A;  . ENDARTERECTOMY Right 03/06/2013   Procedure: ENDARTERECTOMY CAROTID-RIGHT;  Surgeon: Serafina Mitchell, MD;  Location: [place];  Service: Vascular;  Laterality: Right;  . ENDARTERECTOMY Left 05/07/2013   Procedure: LEFT CAROTID ARTERY ENDARTERECTOMY WITH VASCU-GUARD PATCH ANGIOPLASTY ;  Surgeon: Serafina Mitchell, MD;  Location: [place];  Service: Vascular;  Laterality: Left;  . LOBECTOMY Left 02/03/2016   Procedure: LEFT UPPER LOBECTOMY;  Surgeon: [name], MD;  Location: [place];  Service: Thoracic;  Laterality: Left;  . LOWER EXTREMITY ANGIOGRAM N/A 02/25/2013   Procedure: LOWER EXTREMITY ANGIOGRAM;  Surgeon: Lorretta Harp, MD;  Location: [facility] CATH LAB;  Service: Cardiovascular;  Laterality: N/A;  .  LOWER EXTREMITY ANGIOGRAM N/A 07/27/2013   Procedure: LOWER EXTREMITY ANGIOGRAM;  Surgeon: Lorretta Harp, MD;  Location: [facility] CATH LAB;  Service: Cardiovascular;  Laterality: N/A;  . PATCH ANGIOPLASTY Right 03/06/2013   Procedure: PATCH ANGIOPLASTY of Right Carotid Artery using Vascu-Guard Patch;  Surgeon: Serafina Mitchell, MD;  Location: [place];  Service: Vascular;  Laterality: Right;  . PERIPHERAL VASCULAR CATHETERIZATION N/A 11/15/2015   Procedure: Aortic Arch Angiography;  Surgeon: Serafina Mitchell, MD;  Location: [place] CV LAB;  Service: Cardiovascular;  Laterality: N/A;  . PERIPHERAL VASCULAR CATHETERIZATION Bilateral 11/15/2015   Procedure: Carotid Angiography;  Surgeon: Serafina Mitchell, MD;  Location: [place] CV LAB;  Service: Cardiovascular;  Laterality: Bilateral;  . PERIPHERAL VASCULAR CATHETERIZATION Left 11/15/2015   Procedure: Upper Extremity Angiography;  Surgeon: Serafina Mitchell, MD;  Location: [place] CV LAB;  Service: Cardiovascular;  Laterality: Left;  . PERIPHERAL VASCULAR CATHETERIZATION Left 11/15/2015   Procedure: Peripheral Vascular Intervention;  Surgeon: Serafina Mitchell, MD;  Location: [place] CV LAB;  Service: Cardiovascular;  Laterality: Left;  subclavian   . SALIVARY GLAND SURGERY     scar tissue removed from left saliva glad  . TUBAL LIGATION    . VIDEO ASSISTED THORACOSCOPY (VATS)/WEDGE RESECTION Left 02/03/2016   Procedure: VIDEO ASSISTED THORACOSCOPY;  Surgeon: [name], MD;  Location: [place];  Service: Thoracic;  Laterality: Left;     reports that she quit smoking about 5 years ago. Her smoking use included cigarettes. She has a 84.00 pack-year smoking history. She quit smokeless tobacco use about 5 years ago. She reports that she does not drink alcohol or use drugs.  Allergies  Allergen Reactions  . No Known Allergies Other (See Comments)    Family History  Problem Relation Age of Onset  . Stroke Mother   . Hypertension Mother   . Kidney failure Father      Prior to Admission medications   Medication Sig Start Date End Date Taking? Authorizing Provider  albuterol (PROVENTIL HFA;VENTOLIN HFA) 108 (90 Base) MCG/ACT inhaler Inhale 2 puffs into the lungs every 6 (six) hours as needed for wheezing or shortness of breath.  07/11/17  Yes [provider]  albuterol (PROVENTIL) (2.5 MG/3ML) 0.083% nebulizer solution Take 3 mLs (2.5 mg total) by nebulization every 6 (six) hours as needed for wheezing or shortness of  breath. 11/13/18  Yes Martyn Ehrich, NP  aspirin 81 MG chewable tablet Chew by mouth daily.   Yes [provider]  BIOTIN 5000 PO Take 5,000 mg by mouth daily before supper.    Yes [provider]  calcium carbonate (CALCIUM 600) 600 MG TABS tablet Take 600 mg by mouth daily before supper.    Yes [provider]  clopidogrel (PLAVIX) 75 MG tablet Take 1 tablet (75 mg total) by mouth daily. 06/20/18  Yes Thurnell Lose, MD  diazepam (VALIUM) 2 MG tablet Take 5 mg by mouth every 8 (eight) hours as needed for anxiety.    Yes [provider]  diltiazem (DILACOR XR) 180 MG 24 hr capsule Take 180 mg by mouth daily.  07/16/16  Yes [provider]  furosemide (LASIX) 20 MG tablet Take 20-40 mg by mouth daily.    Yes [provider]  HYDROcodone-acetaminophen (NORCO/VICODIN) 5-325 MG tablet Take 1 tablet by mouth every 12 (twelve) hours as needed for moderate pain. 06/20/18  Yes Thurnell Lose, MD  lisinopril (ZESTRIL) 10 MG tablet Take 10  mg by mouth daily.   Yes [provider]  MAGNESIUM PO Take 1 tablet by mouth daily before supper.   Yes [provider]  meclizine (ANTIVERT) 12.5 MG tablet Take 12.5 mg by mouth 3 (three) times daily as needed for dizziness or nausea.  09/30/18  Yes [provider]  Menthol, Topical Analgesic, (ICY HOT EX) Apply 1 spray topically every 8 (eight) hours as needed (pain).   Yes [provider]  metFORMIN (GLUCOPHAGE) 500 MG tablet Take by mouth 2 (two) times daily with a meal.   Yes [provider]  metoprolol tartrate (LOPRESSOR) 50 MG tablet Take 1 tablet (50 mg total) by mouth 2 (two) times daily with a meal. 06/20/18  Yes Thurnell Lose, MD  Omega-3 Fatty Acids (FISH OIL PO) Take 1 capsule by mouth daily.   Yes [provider]  ondansetron (ZOFRAN) 4 MG tablet Take 4 mg by mouth at bedtime as needed for nausea or vomiting.  06/05/18  Yes [provider]   pantoprazole (PROTONIX) 40 MG tablet Take 40 mg by mouth 2 (two) times daily.  12/17/13  Yes [provider]  pravastatin (PRAVACHOL) 40 MG tablet Take 40 mg by mouth daily before supper.    Yes [provider]  traZODone (DESYREL) 50 MG tablet Take 50 mg by mouth at bedtime.   Yes [provider]  Fluticasone-Umeclidin-Vilant (TRELEGY ELLIPTA) 100-62.5-25 MCG/INH AEPB Inhale 1 puff into the lungs daily. 11/13/18   Martyn Ehrich, NP  insulin aspart (NOVOLOG) 100 UNIT/ML injection Substitute to any brand approved.Before each meal 3 times a day, 140-199 - 2 units, 200-250 - 4 units, 251-299 - 6 units,  300-349 - 8 units,  350 or above 10 units. Dispense syringes and needles as needed, Ok to switch to PEN if approved. DX DM2, Code E11.65 Patient not taking: Reported on 11/13/2018 06/20/18   Thurnell Lose, MD  insulin glargine (LANTUS) 100 UNIT/ML injection Inject 0.08 mLs (8 Units total) into the skin at bedtime. Dispense insulin pen if approved, if not dispense as needed syringes and needles for 1 month supply. Can switch to Levemir. Diagnosis E 11.65. Patient not taking: Reported on 10/03/2018 06/20/18   Thurnell Lose, MD  ONE [facility] ULTRA TEST test strip  09/12/16   [provider]    Physical Exam: Vitals:   11/13/18 1900 11/13/18 1929 11/13/18 2100 11/13/18 2131  BP:  (!) 155/101 (!) 145/93 (!) 150/122  Pulse: 77 83 89 88  Resp: 20 (!) 21 (!) 26 18  Temp:      TempSrc:      SpO2: 94% 96% 96% 92%  Weight:      Height:          Constitutional: NAD, anxious, on oxygen Vitals:   11/13/18 1900 11/13/18 1929 11/13/18 2100 11/13/18 2131  BP:  (!) 155/101 (!) 145/93 (!) 150/122  Pulse: 77 83 89 88  Resp: 20 (!) 21 (!) 26 18  Temp:      TempSrc:      SpO2: 94% 96% 96% 92%  Weight:      Height:       Eyes: PERRL, lids and conjunctivae normal ENMT: Mucous membranes are moist. Posterior pharynx clear of any exudate or lesions.Normal dentition.   Neck: normal, supple, no masses, no thyromegaly Respiratory: Decreased air entry bilaterally with marked crackles right more than left, some rhonchi, increased respiratory effort.   No accessory muscle use.  Cardiovascular: Tachycardia, no  murmurs / rubs / gallops. No extremity edema. 2+ pedal pulses. No carotid bruits.  Abdomen: no tenderness, no masses palpated. No hepatosplenomegaly. Bowel sounds positive.  Musculoskeletal: no clubbing / cyanosis. No joint deformity upper and lower extremities. Good ROM, no contractures. Normal muscle tone.  Skin: no rashes, lesions, ulcers. No induration Neurologic: CN 2-12 grossly intact. Sensation intact, DTR normal. Strength 5/5 in all 4.  Psychiatric: Normal judgment and insight. Alert and oriented x 3. Normal mood.     Labs on Admission: I have personally reviewed following labs and imaging studies  CBC: Recent Labs  Lab 11/13/18 1434  WBC 7.4  NEUTROABS 6.0  HGB 11.9*  HCT 38.6  MCV 85.3  PLT 803.2   Basic Metabolic Panel: Recent Labs  Lab 11/13/18 1434  NA 141  K 3.6  CL 93*  CO2 39*  GLUCOSE 145*  BUN 19  CREATININE 0.91  CALCIUM 9.3   GFR: Estimated Creatinine Clearance: 53.6 mL/min (by C-G formula based on SCr of 0.91 mg/dL). Liver Function Tests: No results for input(s): AST, ALT, ALKPHOS, BILITOT, PROT, ALBUMIN in the last 168 hours. No results for input(s): LIPASE, AMYLASE in the last 168 hours. No results for input(s): AMMONIA in the last 168 hours. Coagulation Profile: No results for input(s): INR, PROTIME in the last 168 hours. Cardiac Enzymes: No results for input(s): CKTOTAL, CKMB, CKMBINDEX, TROPONINI in the last 168 hours. BNP (last 3 results) Recent Labs    11/13/18 1434  PROBNP 1,147.0*   HbA1C: No results for input(s): HGBA1C in the last 72 hours. CBG: No results for input(s): GLUCAP in the last 168 hours. Lipid Profile: No results for input(s): CHOL, HDL, LDLCALC, TRIG, CHOLHDL, LDLDIRECT in the  last 72 hours. Thyroid Function Tests: No results for input(s): TSH, T4TOTAL, FREET4, T3FREE, THYROIDAB in the last 72 hours. Anemia Panel: No results for input(s): VITAMINB12, FOLATE, FERRITIN, TIBC, IRON, RETICCTPCT in the last 72 hours. Urine analysis:    Component Value Date/Time   COLORURINE AMBER (A) 02/01/2016 1316   APPEARANCEUR CLEAR 02/01/2016 1316   LABSPEC 1.020 02/01/2016 1316   PHURINE 5.5 02/01/2016 1316   GLUCOSEU NEGATIVE 02/01/2016 1316   HGBUR NEGATIVE 02/01/2016 1316   BILIRUBINUR NEGATIVE 02/01/2016 1316   KETONESUR NEGATIVE 02/01/2016 1316   PROTEINUR NEGATIVE 02/01/2016 1316   UROBILINOGEN 0.2 04/30/2013 1337   NITRITE NEGATIVE 02/01/2016 1316   LEUKOCYTESUR TRACE (A) 02/01/2016 1316   Sepsis Labs: _0 (procalcitonin:4,lacticidven:4) )No results found for this or any previous visit (from the past 240 hour(s)).   Radiological Exams on Admission: Dg Chest 2 View  Result Date: 11/13/2018 CLINICAL DATA:  Acute shortness of breath EXAM: CHEST - 2 VIEW COMPARISON:  06/18/2018 CT, 05/13/2018 chest radiograph and prior studies FINDINGS: Cardiomegaly and mild pulmonary vascular congestion noted. Small bilateral pleural effusions are noted, increased on the RIGHT and stable on the LEFT since 06/18/2018. Mild bibasilar atelectasis noted. No pneumothorax or acute bony abnormality. IMPRESSION: 1. Small bilateral pleural effusions, increased on the RIGHT and stable on the LEFT since 06/18/2018. Mild bibasilar atelectasis. 2. Cardiomegaly and mild pulmonary vascular congestion. Electronically Signed   By: Margarette Canada M.D.   On: 11/13/2018 16:23    EKG: Independently reviewed.  It shows normal sinus rhythm with a rate of 84, normal intervals, red axis deviation.  No significant ST changes  Assessment/Plan Principal Problem:   Acute on chronic combined systolic and diastolic CHF (congestive heart failure) (HCC) Active Problems:   Essential hypertension   Type 2  diabetes mellitus ([place])   PVD (peripheral vascular disease) with claudication ([place])   Carotid artery obstruction   COPD GOLD II   Cancer of left lung ([place])     #1 acute on chronic diastolic CHF exacerbation: Patient has fluid overload.  She had an echo 4 months ago.  We will diurese aggressively and defer up repeat echo to cardiology.  Continue other cardiac medications.  Counseling and guidance on dietary intake will be given.  #2 COPD: No significant wheezing.  No evidence of exacerbation of her COPD.  Continue monitoring.  #3 hypertension: Continue blood pressure medications.  #4 peripheral vascular disease: Continue home regimen.  #5 diabetes: Sliding scale insulin with home regimen.  #6 history of left lung cancer: Not on treatment at this point.   DVT prophylaxis: Heparin Code Status: Full code Family Communication: No family at bedside Disposition Plan: Home Consults called: None Admission status: Inpatient  Severity of Illness: The appropriate patient status for this patient is INPATIENT. Inpatient status is judged to be reasonable and necessary in order to provide the required intensity of service to ensure the patient's safety. The patient's presenting symptoms, physical exam findings, and initial radiographic and laboratory data in the context of their chronic comorbidities is felt to place them at high risk for further clinical deterioration. Furthermore, it is not anticipated that the patient will be medically stable for discharge from the hospital within 2 midnights of admission. The following factors support the patient status of inpatient.   " The patient's presenting symptoms include shortness of breath. " The worrisome physical exam findings include bilateral crackles. " The initial radiographic and laboratory data are worrisome because of evidence of pulmonary edema and pleural effusion. " The chronic co-morbidities include diastolic CHF and COPD.   * I certify  that at the point of admission it is my clinical judgment that the patient will require inpatient hospital care spanning beyond 2 midnights from the point of admission due to high intensity of service, high risk for further deterioration and high frequency of surveillance required.Barbette Merino MD [facility] Pager [phone]  If 7PM-7AM, please contact night-coverage [email or web address] Password [email or web address]  11/13/2018, 10:27 PM

## 2018-11-13 NOTE — Progress Notes (Signed)
Called and spoke with both pt and niece [name]. I stated to both of them the results of the cxr and stated that two of our providers Geraldo Pitter and Wyn Quaker both agree that she needs to present to the ER for further evaluation.  Stated to both of them that [name] is where she should go as MW is currently rounding over there and he could be able to come see her in the ER.  [name] verbalized understanding and stated she thought this is what was going to happen. She said that she was going to talk this over with pt and then would get her to the ER. Nothing further needed.

## 2018-11-13 NOTE — ED Notes (Signed)
Called to give report, was asked to call back in 10 min.

## 2018-11-13 NOTE — Addendum Note (Signed)
Addended by: Suzzanne Cloud E on: 11/13/2018 02:34 PM   Modules accepted: Orders

## 2018-11-13 NOTE — Telephone Encounter (Signed)
Thank you for reaching the patient.  Just to further clarify Dr. Melvyn Novas will be rounding there tomorrow.  But a member of our team could be consulted as needed and decided by the emergency room physician/APP.  I agree the emergency room would be the best option for the patient as she is having acute worsening symptoms and likely needs to have increased diuresis and potentially further imaging.  Wyn Quaker, FNP

## 2018-11-13 NOTE — ED Triage Notes (Signed)
Patient c/o SOB x 3 months with worsening.symptoms, bilateral leg swelling and abdominal bloating. Patient's family member reports swelling worsening in the past 3-4 weeks with decreased sats.   BNP-1, 147 and abnormal CXR.

## 2018-11-13 NOTE — Telephone Encounter (Signed)
11/13/2018 1544  Triage,  This patient completed a tele-visit with Beth today.  Labs and chest x-ray were ordered.  Please contact the patient and update her that we are no longer able to complete chest x-rays today in office.  Please explained to the patient she will need to go to [facility]  We can place the orders for labs as well as x-rays to be completed at [facility].  If patient is having worsening or acute symptoms she will need to present to an emergency room preferably [facility] as Dr. Melvyn Novas is currently rounding there and she is a Dr. Melvyn Novas patient.  Routing to EW and Dr. Melvyn Novas.   By Warner Mccreedy FNP

## 2018-11-14 ENCOUNTER — Inpatient Hospital Stay (HOSPITAL_COMMUNITY): Payer: PPO

## 2018-11-14 ENCOUNTER — Other Ambulatory Visit (HOSPITAL_COMMUNITY): Payer: PPO

## 2018-11-14 DIAGNOSIS — I5043 Acute on chronic combined systolic (congestive) and diastolic (congestive) heart failure: Secondary | ICD-10-CM

## 2018-11-14 LAB — CBC WITH DIFFERENTIAL/PLATELET
Abs Immature Granulocytes: 0.04 10*3/uL (ref 0.00–0.07)
Basophils Absolute: 0 10*3/uL (ref 0.0–0.1)
Basophils Relative: 1 %
Eosinophils Absolute: 0.2 10*3/uL (ref 0.0–0.5)
Eosinophils Relative: 3 %
HCT: 41.7 % (ref 36.0–46.0)
Hemoglobin: 12.1 g/dL (ref 12.0–15.0)
Immature Granulocytes: 1 %
Lymphocytes Relative: 9 %
Lymphs Abs: 0.7 10*3/uL (ref 0.7–4.0)
MCH: 26.1 pg (ref 26.0–34.0)
MCHC: 29 g/dL — ABNORMAL LOW (ref 30.0–36.0)
MCV: 90.1 fL (ref 80.0–100.0)
Monocytes Absolute: 0.6 10*3/uL (ref 0.1–1.0)
Monocytes Relative: 7 %
Neutro Abs: 6 10*3/uL (ref 1.7–7.7)
Neutrophils Relative %: 79 %
Platelets: 239 10*3/uL (ref 150–400)
RBC: 4.63 MIL/uL (ref 3.87–5.11)
RDW: 13.7 % (ref 11.5–15.5)
WBC: 7.6 10*3/uL (ref 4.0–10.5)
nRBC: 0 % (ref 0.0–0.2)

## 2018-11-14 LAB — BASIC METABOLIC PANEL
Anion gap: 15 (ref 5–15)
BUN: 16 mg/dL (ref 8–23)
CO2: 37 mmol/L — ABNORMAL HIGH (ref 22–32)
Calcium: 9.1 mg/dL (ref 8.9–10.3)
Chloride: 90 mmol/L — ABNORMAL LOW (ref 98–111)
Creatinine, Ser: 0.76 mg/dL (ref 0.44–1.00)
GFR calc Af Amer: >60 mL/min (ref >60)
GFR calc non Af Amer: >60 mL/min (ref >60)
Glucose, Bld: 181 mg/dL — ABNORMAL HIGH (ref 70–99)
Potassium: 3.2 mmol/L — ABNORMAL LOW (ref 3.5–5.1)
Sodium: 142 mmol/L (ref 135–145)

## 2018-11-14 LAB — ECHOCARDIOGRAM COMPLETE
Height: 64 in
Weight: 2991.2 oz

## 2018-11-14 LAB — GLUCOSE, CAPILLARY
Glucose-Capillary: 131 mg/dL — ABNORMAL HIGH (ref 70–99)
Glucose-Capillary: 177 mg/dL — ABNORMAL HIGH (ref 70–99)
Glucose-Capillary: 129 mg/dL — ABNORMAL HIGH (ref 70–99)
Glucose-Capillary: 150 mg/dL — ABNORMAL HIGH (ref 70–99)

## 2018-11-14 LAB — BRAIN NATRIURETIC PEPTIDE: B Natriuretic Peptide: 528.4 pg/mL — ABNORMAL HIGH (ref 0.0–100.0)

## 2018-11-14 MED ORDER — PERFLUTREN LIPID MICROSPHERE
1.0000 mL | INTRAVENOUS | Status: AC | PRN
  Administered 2018-11-14: 14:00:00 2 mL via INTRAVENOUS
  Filled 2018-11-14: qty 10

## 2018-11-14 MED ORDER — POTASSIUM CHLORIDE CRYS ER 20 MEQ PO TBCR
40.0000 meq | EXTENDED_RELEASE_TABLET | ORAL | Status: AC
  Administered 2018-11-14 (×2): 40 meq via ORAL
  Filled 2018-11-14 (×2): qty 2

## 2018-11-14 MED ORDER — CEPHALEXIN 500 MG PO CAPS
500.0000 mg | ORAL_CAPSULE | Freq: Four times a day (QID) | ORAL | Status: AC
  Administered 2018-11-14 – 2018-11-19 (×20): 500 mg via ORAL
  Filled 2018-11-14 (×21): qty 1

## 2018-11-14 MED ORDER — POLYETHYLENE GLYCOL 3350 17 G PO PACK
17.0000 g | PACK | Freq: Two times a day (BID) | ORAL | Status: DC
  Administered 2018-11-14 – 2018-11-24 (×16): 17 g via ORAL
  Filled 2018-11-14 (×20): qty 1

## 2018-11-14 NOTE — Progress Notes (Signed)
  Echocardiogram 2D Echocardiogram has been performed.  Jennette Dubin 11/14/2018, 3:07 PM

## 2018-11-14 NOTE — Progress Notes (Signed)
PROGRESS NOTE    Caitlin Oliver  DSK:[id number] DOB: 22-Mar-1941 DOA: 11/13/2018 PCP: [name], PA-C   Brief Narrative:  Caitlin Oliver is Caitlin Oliver 78 y.o. female with medical history significant of advanced COPD followed by pulmonology, diastolic dysfunction CHF with EF in March of this year showing 60%, anxiety disorder, peripheral vascular disease, diabetes, GERD, hyperlipidemia, hypertension who presented to the ER with progressive shortness of breath leg swelling and cough.  Patient had CVA in March of this year.  She also has had lung cancer as well as coronary artery disease.  All these have been under control.  She has been on 2 L of oxygen at home since March of this year.  Patient has noticed worsening shortness of breath in the last 2 weeks including oxygen dropping into the 70s saturation.  Also mainly exertional.  Some PND and orthopnea.  She was seen in the outpatient setting was on 20 mg of Lasix which was increased to 40 mg 2 days ago.  She had increased urination but still continues to have more shortness of breath and leg swelling.  She came to the ER where she was evaluated.  Patient has evidence of fluid overload and is being admitted with [place]hole [place] diagnosis of acute exacerbation of diastolic CHF.[name]  ED Course: Temperature is 98.4 blood pressure 155/101 pulse 89 respiratory of 26 oxygen sats 92% on room air.  White count 7.4 hemoglobin 11.9 and platelets of 271.  Chemistry appears to be normal except for CO2 of 39 chloride 93.  COVID-19 testing is negative.  Chest x-ray shows small bilateral pleural effusions more on the right.  Cardiomegaly and mild pulmonary vascular congestion.  Assessment & Plan:   Principal Problem:   Acute on chronic combined systolic and diastolic CHF (congestive heart failure) (HCC) Active Problems:   Essential hypertension   Type 2 diabetes mellitus (HCC)   PVD (peripheral vascular disease) with claudication ([place])   Carotid artery obstruction   COPD GOLD  II   Cancer of left lung (HCC)  #1 acute on chronic diastolic CHF exacerbation:  She estimates her dry weight ~176, 10 lbs up here Currently on 2 L O2, which is what she's been on since March Diurese with 40 lasix IV BID I/O, daily weights Dietician for education Follow repeat echo (prior with EF 62-03% diastolic dysfunction, elevated RVSP)  # Cellulitis: appears to have some cellulitis of both legs - some erythema around excoriations, mainly on L leg, but also had redness to bilateral feet.  Start keflex, plan for 5 days  #2 COPD: No significant wheezing.  No evidence of exacerbation of her COPD.  Continue monitoring. Continue anoro ellipta, pulmicort, and prn albuterol  #3 hypertension: continue diltiazem, lisinopril, metoprolol  #4 peripheral vascular disease: Continue asa, plavix, pravastatin  #5 diabetes: Sliding scale insulin with home regimen.  #6 history of left lung cancer: Not on treatment at this point.  # Anxiety  Insomnia: valium prn, trazodone at night  # GERD: continue PPI BID  # Hypokalemia: replace and follow  DVT prophylaxis: heparin Code Status: full  Family Communication: daughter Disposition Plan: pending further improvement in volume overload, requires further inpatient diuresis  Consultants:   none  Procedures:   none  Antimicrobials: Anti-infectives (From admission, onward)   Start     Dose/Rate Route Frequency Ordered Stop   11/14/18 1200  cephALEXin (KEFLEX) capsule 500 mg     500 mg Oral Every 6 hours 11/14/18 1116 11/19/18 1159  Subjective: Feeling better Still not back to baseline Legs are still swollen  Objective: Vitals:   11/13/18 2225 11/13/18 2236 11/14/18 0559 11/14/18 0841  BP:  (!) 134/96 124/81   Pulse:  82 88   Resp:  18 16   Temp:  98.1 F (36.7 C) (!) 97.5 F (36.4 C)   TempSrc:  Oral Oral   SpO2:  98% 99% 91%  Weight: 84.8 kg     Height:        Intake/Output Summary (Last 24 hours) at 11/14/2018  1551 Last data filed at 11/14/2018 1348 Gross per 24 hour  Intake 840 ml  Output 2450 ml  Net -1610 ml   Filed Weights   11/13/18 1804 11/13/18 2225  Weight: 84.8 kg 84.8 kg    Examination:  General exam: Appears calm and comfortable  Respiratory system: Clear to auscultation. Respiratory effort normal. Cardiovascular system: S1 & S2 heard, RRR. Gastrointestinal system: Abdomen is nondistended, soft and nontender.  Central nervous system: Alert and oriented. No focal neurological deficits. Extremities: bilateral LE edema, some weeping from excoriations on L leg, some redness to bilateral LE's Psychiatry: Judgement and insight appear normal. Mood & affect appropriate.     Data Reviewed: I have personally reviewed following labs and imaging studies  CBC: Recent Labs  Lab 11/13/18 1434 11/13/18 2300 11/14/18 0431  WBC 7.4 7.6 7.6  NEUTROABS 6.0  --  6.0  HGB 11.9* 12.1 12.1  HCT 38.6 42.1 41.7  MCV 85.3 90.0 90.1  PLT 271.0 266 673   Basic Metabolic Panel: Recent Labs  Lab 11/13/18 1434 11/13/18 2300 11/14/18 0431  NA 141  --  142  K 3.6  --  3.2*  CL 93*  --  90*  CO2 39*  --  37*  GLUCOSE 145*  --  181*  BUN 19  --  16  CREATININE 0.91 0.74 0.76  CALCIUM 9.3  --  9.1   GFR: Estimated Creatinine Clearance: 61 mL/min (by C-G formula based on SCr of 0.76 mg/dL). Liver Function Tests: No results for input(s): AST, ALT, ALKPHOS, BILITOT, PROT, ALBUMIN in the last 168 hours. No results for input(s): LIPASE, AMYLASE in the last 168 hours. No results for input(s): AMMONIA in the last 168 hours. Coagulation Profile: No results for input(s): INR, PROTIME in the last 168 hours. Cardiac Enzymes: No results for input(s): CKTOTAL, CKMB, CKMBINDEX, TROPONINI in the last 168 hours. BNP (last 3 results) Recent Labs    11/13/18 1434  PROBNP 1,147.0*   HbA1C: No results for input(s): HGBA1C in the last 72 hours. CBG: Recent Labs  Lab 11/13/18 2338 11/14/18 0730  11/14/18 1155  GLUCAP 160* 131* 177*   Lipid Profile: No results for input(s): CHOL, HDL, LDLCALC, TRIG, CHOLHDL, LDLDIRECT in the last 72 hours. Thyroid Function Tests: No results for input(s): TSH, T4TOTAL, FREET4, T3FREE, THYROIDAB in the last 72 hours. Anemia Panel: No results for input(s): VITAMINB12, FOLATE, FERRITIN, TIBC, IRON, RETICCTPCT in the last 72 hours. Sepsis Labs: No results for input(s): PROCALCITON, LATICACIDVEN in the last 168 hours.  Recent Results (from the past 240 hour(s))  SARS Coronavirus 2 (CEPHEID - Performed in [place] hospital lab), Hosp Order     Status: None   Collection Time: 11/13/18  8:12 PM   Specimen: Nasopharyngeal Swab  Result Value Ref Range Status   SARS Coronavirus 2 NEGATIVE NEGATIVE Final    Comment: (NOTE) If result is NEGATIVE SARS-CoV-2 target nucleic acids are NOT DETECTED. The SARS-CoV-2 RNA  is generally detectable in upper and lower  respiratory specimens during the acute phase of infection. The lowest  concentration of SARS-CoV-2 viral copies this assay can detect is 250  copies / mL. Maycen Degregory negative result does not preclude SARS-CoV-2 infection  and should not be used as the sole basis for treatment or other  patient management decisions.  Hayward Rylander negative result may occur with  improper specimen collection / handling, submission of specimen other  than nasopharyngeal swab, presence of viral mutation(s) within the  areas targeted by this assay, and inadequate number of viral copies  (<250 copies / mL). Jeramey Lanuza negative result must be combined with clinical  observations, patient history, and epidemiological information. If result is POSITIVE SARS-CoV-2 target nucleic acids are DETECTED. The SARS-CoV-2 RNA is generally detectable in upper and lower  respiratory specimens dur ing the acute phase of infection.  Positive  results are indicative of active infection with SARS-CoV-2.  Clinical  correlation with patient history and other  diagnostic information is  necessary to determine patient infection status.  Positive results do  not rule out bacterial infection or co-infection with other viruses. If result is PRESUMPTIVE POSTIVE SARS-CoV-2 nucleic acids MAY BE PRESENT.   Reynoldo Mainer presumptive positive result was obtained on the submitted specimen  and confirmed on repeat testing.  While 2019 novel coronavirus  (SARS-CoV-2) nucleic acids may be present in the submitted sample  additional confirmatory testing may be necessary for epidemiological  and / or clinical management purposes  to differentiate between  SARS-CoV-2 and other Sarbecovirus currently known to infect humans.  If clinically indicated additional testing with an alternate test  methodology [phone]) is advised. The SARS-CoV-2 RNA is generally  detectable in upper and lower respiratory sp ecimens during the acute  phase of infection. The expected result is Negative. Fact Sheet for Patients:  StrictlyIdeas.no Fact Sheet for Healthcare Providers: BankingDealers.co.za This test is not yet approved or cleared by the [place] FDA and has been authorized for detection and/or diagnosis of SARS-CoV-2 by FDA under an Emergency Use Authorization (EUA).  This EUA will remain in effect (meaning this test can be used) for the duration of the COVID-19 declaration under Section 564(b)(1) of the Act, 21 U.S.C. section 360bbb-3(b)(1), unless the authorization is terminated or revoked sooner. Performed at [facility], [place] [address] [place], [place] [address]          Radiology Studies: Dg Chest 2 View  Result Date: 11/13/2018 CLINICAL DATA:  Acute shortness of breath EXAM: CHEST - 2 VIEW COMPARISON:  06/18/2018 CT, 05/13/2018 chest radiograph and prior studies FINDINGS: Cardiomegaly and mild pulmonary vascular congestion noted. Small bilateral pleural effusions are noted, increased on the RIGHT  and stable on the LEFT since 06/18/2018. Mild bibasilar atelectasis noted. No pneumothorax or acute bony abnormality. IMPRESSION: 1. Small bilateral pleural effusions, increased on the RIGHT and stable on the LEFT since 06/18/2018. Mild bibasilar atelectasis. 2. Cardiomegaly and mild pulmonary vascular congestion. Electronically Signed   By: Margarette Canada M.D.   On: 11/13/2018 16:23        Scheduled Meds: . aspirin  81 mg Oral Daily  . budesonide (PULMICORT) nebulizer solution  0.25 mg Nebulization BID  . calcium carbonate  1,250 mg Oral QAC supper  . cephALEXin  500 mg Oral Q6H  . clopidogrel  75 mg Oral Daily  . diltiazem  180 mg Oral Daily  . furosemide  40 mg Intravenous BID  . heparin  5,000 Units Subcutaneous Q8H  .  insulin aspart  0-5 Units Subcutaneous QHS  . insulin aspart  0-9 Units Subcutaneous TID WC  . insulin glargine  8 Units Subcutaneous QHS  . lisinopril  10 mg Oral Daily  . magnesium oxide  200 mg Oral QAC supper  . metoprolol tartrate  50 mg Oral BID WC  . omega-3 acid ethyl esters  1 g Oral Daily  . pantoprazole  40 mg Oral BID  . pravastatin  40 mg Oral QAC supper  . sodium chloride flush  3 mL Intravenous Q12H  . traZODone  50 mg Oral QHS  . umeclidinium-vilanterol  1 puff Inhalation Daily   Continuous Infusions: . sodium chloride       LOS: 1 day    Time spent: over 30 min    Fayrene Helper, MD [facility] Pager AMION  If 7PM-7AM, please contact night-coverage [email or web address] Password [facility] 11/14/2018, 3:51 PM

## 2018-11-15 LAB — CBC
HCT: 40.5 % (ref 36.0–46.0)
Hemoglobin: 11.4 g/dL — ABNORMAL LOW (ref 12.0–15.0)
MCH: 25.9 pg — ABNORMAL LOW (ref 26.0–34.0)
MCHC: 28.1 g/dL — ABNORMAL LOW (ref 30.0–36.0)
MCV: 91.8 fL (ref 80.0–100.0)
Platelets: 254 10*3/uL (ref 150–400)
RBC: 4.41 MIL/uL (ref 3.87–5.11)
RDW: 13.8 % (ref 11.5–15.5)
WBC: 7.3 10*3/uL (ref 4.0–10.5)
nRBC: 0 % (ref 0.0–0.2)

## 2018-11-15 LAB — GLUCOSE, CAPILLARY
Glucose-Capillary: 142 mg/dL — ABNORMAL HIGH (ref 70–99)
Glucose-Capillary: 113 mg/dL — ABNORMAL HIGH (ref 70–99)
Glucose-Capillary: 183 mg/dL — ABNORMAL HIGH (ref 70–99)
Glucose-Capillary: 138 mg/dL — ABNORMAL HIGH (ref 70–99)
Glucose-Capillary: 130 mg/dL — ABNORMAL HIGH (ref 70–99)

## 2018-11-15 LAB — COMPREHENSIVE METABOLIC PANEL
ALT: 14 U/L (ref 0–44)
AST: 20 U/L (ref 15–41)
Albumin: 3.6 g/dL (ref 3.5–5.0)
Alkaline Phosphatase: 42 U/L (ref 38–126)
Anion gap: 10 (ref 5–15)
BUN: 20 mg/dL (ref 8–23)
CO2: 38 mmol/L — ABNORMAL HIGH (ref 22–32)
Calcium: 8.8 mg/dL — ABNORMAL LOW (ref 8.9–10.3)
Chloride: 91 mmol/L — ABNORMAL LOW (ref 98–111)
Creatinine, Ser: 0.76 mg/dL (ref 0.44–1.00)
GFR calc Af Amer: >60 mL/min (ref >60)
GFR calc non Af Amer: >60 mL/min (ref >60)
Glucose, Bld: 154 mg/dL — ABNORMAL HIGH (ref 70–99)
Potassium: 3.6 mmol/L (ref 3.5–5.1)
Sodium: 139 mmol/L (ref 135–145)
Total Bilirubin: 0.5 mg/dL (ref 0.3–1.2)
Total Protein: 6.4 g/dL — ABNORMAL LOW (ref 6.5–8.1)

## 2018-11-15 LAB — NOVEL CORONAVIRUS, NAA: SARS-CoV-2, NAA: NOT DETECTED

## 2018-11-15 LAB — MAGNESIUM: Magnesium: 1.7 mg/dL (ref 1.7–2.4)

## 2018-11-15 MED ORDER — HYDROCODONE-ACETAMINOPHEN 5-325 MG PO TABS
1.0000 | ORAL_TABLET | ORAL | Status: DC | PRN
  Administered 2018-11-15 – 2018-11-24 (×26): 1 via ORAL
  Filled 2018-11-15 (×27): qty 1

## 2018-11-15 MED ORDER — GLYCERIN (LAXATIVE) 2.1 G RE SUPP
1.0000 | Freq: Every day | RECTAL | Status: DC | PRN
  Administered 2018-11-16: 1 via RECTAL
  Filled 2018-11-15 (×3): qty 1

## 2018-11-15 MED ORDER — FUROSEMIDE 10 MG/ML IJ SOLN
60.0000 mg | Freq: Two times a day (BID) | INTRAMUSCULAR | Status: DC
  Administered 2018-11-15 – 2018-11-18 (×6): 60 mg via INTRAVENOUS
  Filled 2018-11-15 (×6): qty 6

## 2018-11-15 MED ORDER — BISACODYL 10 MG RE SUPP
10.0000 mg | Freq: Once | RECTAL | Status: DC
  Filled 2018-11-15: qty 1

## 2018-11-15 MED ORDER — UMECLIDINIUM-VILANTEROL 62.5-25 MCG/INH IN AEPB: 1.0000 | INHALATION_SPRAY | Freq: Every day | RESPIRATORY_TRACT | Status: DC

## 2018-11-15 MED ORDER — UMECLIDINIUM-VILANTEROL 62.5-25 MCG/INH IN AEPB
1.0000 | INHALATION_SPRAY | Freq: Every day | RESPIRATORY_TRACT | Status: DC
  Administered 2018-11-15 – 2018-11-24 (×8): 1 via RESPIRATORY_TRACT
  Filled 2018-11-15: qty 14

## 2018-11-15 NOTE — Progress Notes (Signed)
PROGRESS NOTE    Caitlin Oliver  GBT:[id number] DOB: 27-Nov-1940 DOA: 11/13/2018 PCP: [name], PA-C   Brief Narrative:  Caitlin Oliver is Caitlin Oliver 78 y.o. female with medical history significant of advanced COPD followed by pulmonology, diastolic dysfunction CHF with EF in March of this year showing 60%, anxiety disorder, peripheral vascular disease, diabetes, GERD, hyperlipidemia, hypertension who presented to the ER with progressive shortness of breath leg swelling and cough.  Patient had CVA in March of this year.  She also has had lung cancer as well as coronary artery disease.  All these have been under control.  She has been on 2 L of oxygen at home since March of this year.  Patient has noticed worsening shortness of breath in the last 2 weeks including oxygen dropping into the 70s saturation.  Also mainly exertional.  Some PND and orthopnea.  She was seen in the outpatient setting was on 20 mg of Lasix which was increased to 40 mg 2 days ago.  She had increased urination but still continues to have more shortness of breath and leg swelling.  She came to the ER where she was evaluated.  Patient has evidence of fluid overload and is being admitted with Caitlin Oliver diagnosis of acute exacerbation of diastolic CHF.[name]  ED Course: Temperature is 98.4 blood pressure 155/101 pulse 89 respiratory of 26 oxygen sats 92% on room air.  White count 7.4 hemoglobin 11.9 and platelets of 271.  Chemistry appears to be normal except for CO2 of 39 chloride 93.  COVID-19 testing is negative.  Chest x-ray shows small bilateral pleural effusions more on the right.  Cardiomegaly and mild pulmonary vascular congestion.  Assessment & Plan:   Principal Problem:   Acute on chronic combined systolic and diastolic CHF (congestive heart failure) (HCC) Active Problems:   Essential hypertension   Type 2 diabetes mellitus (HCC)   PVD (peripheral vascular disease) with claudication (HCC)   Carotid artery obstruction   COPD GOLD  II   Cancer of left lung (HCC)  #1 acute on chronic diastolic CHF exacerbation   Pulmonary Hypertension:  She estimates her dry weight ~176, 10 lbs up here Currently on 2 L O2, which is what she's been on since March Diurese with 60 lasix IV BID I/O, daily weights - weight relatively stable today, net negative, increase lasix  Dietician for education Follow repeat echo (EF >65%, mildly reduced systolic function, elevated RVSP) Consider cardiology c/s if difficulty with diuresis  # Cellulitis: appears to have some cellulitis of both legs - some erythema around excoriations, mainly on L leg, but also had redness to bilateral feet.  Start keflex, plan for 5 days  #2 COPD: No significant wheezing.  No evidence of exacerbation of her COPD.  Continue monitoring. Continue anoro ellipta, pulmicort, and prn albuterol  #3 hypertension: continue diltiazem, lisinopril, metoprolol  #4 peripheral vascular disease: Continue asa, plavix, pravastatin  #5 diabetes: Sliding scale insulin with home regimen.  #6 history of left lung cancer: Not on treatment at this point.  # Anxiety   Insomnia: valium prn, trazodone at night  # GERD: continue PPI BID  # Hypokalemia: replace and follow  DVT prophylaxis: heparin Code Status: full  Family Communication: daughter Disposition Plan: pending further improvement in volume overload, requires further inpatient diuresis  Consultants:   none  Procedures:  Echo IMPRESSIONS    1. The left ventricle has hyperdynamic systolic function, with an ejection fraction of >65%. The cavity size was normal.  There is moderate asymmetric left ventricular hypertrophy of the septal wall. Left ventricular diastolic Doppler parameters are  indeterminate.  2. The right ventricle has mildly reduced systolic function. The cavity was severely enlarged. There is no increase in right ventricular wall thickness. Right ventricular systolic pressure is severely elevated  with an estimated pressure of 65.8 mmHg.  3. Small pericardial effusion.  4. No stenosis of the aortic valve.  5. The aorta is normal in size and structure.  6. The aortic root is normal in size and structure.  7. The inferior vena cava was dilated in size with >50% respiratory variability.  Antimicrobials: Anti-infectives (From admission, onward)   Start     Dose/Rate Route Frequency Ordered Stop   11/14/18 1200  cephALEXin (KEFLEX) capsule 500 mg     500 mg Oral Every 6 hours 11/14/18 1116 11/19/18 1159     Subjective: Still not to baseline Legs maybe slightly better SOB after working with PT  Objective: Vitals:   11/15/18 0530 11/15/18 0812 11/15/18 0813 11/15/18 1306  BP: 93/76     Pulse: 73     Resp: 18     Temp: 98 F (36.7 C)     TempSrc: Oral     SpO2: 93% 95% 95% 95%  Weight:      Height:        Intake/Output Summary (Last 24 hours) at 11/15/2018 1402 Last data filed at 11/15/2018 1033 Gross per 24 hour  Intake 240 ml  Output 2350 ml  Net -2110 ml   Filed Weights   11/13/18 1804 11/13/18 2225 11/15/18 0500  Weight: 84.8 kg 84.8 kg 84.7 kg    Examination:  General: No acute distress. Cardiovascular: Heart sounds show Caitlin Oliver regular rate, and rhythm.  Lungs: Clear to auscultation bilaterally  Abdomen: Soft, nontender, nondistended Neurological: Alert and oriented 3. Moves all extremities 4 . Cranial nerves II through XII grossly intact. Extremities: bilateral LE edema, maybe minimally improved 1-2+.  Excoriations on L>R leg, with surrounding erythema.  Redness to bilateral feet.    Data Reviewed: I have personally reviewed following labs and imaging studies  CBC: Recent Labs  Lab 11/13/18 1434 11/13/18 2300 11/14/18 0431 11/15/18 0610  WBC 7.4 7.6 7.6 7.3  NEUTROABS 6.0  --  6.0  --   HGB 11.9* 12.1 12.1 11.4*  HCT 38.6 42.1 41.7 40.5  MCV 85.3 90.0 90.1 91.8  PLT 271.0 266 239 440   Basic Metabolic Panel: Recent Labs  Lab 11/13/18 1434  11/13/18 2300 11/14/18 0431 11/15/18 0610  NA 141  --  142 139  K 3.6  --  3.2* 3.6  CL 93*  --  90* 91*  CO2 39*  --  37* 38*  GLUCOSE 145*  --  181* 154*  BUN 19  --  16 20  CREATININE 0.91 0.74 0.76 0.76  CALCIUM 9.3  --  9.1 8.8*  MG  --   --   --  1.7   GFR: Estimated Creatinine Clearance: 61 mL/min (by C-G formula based on SCr of 0.76 mg/dL). Liver Function Tests: Recent Labs  Lab 11/15/18 0610  AST 20  ALT 14  ALKPHOS 42  BILITOT 0.5  PROT 6.4*  ALBUMIN 3.6   No results for input(s): LIPASE, AMYLASE in the last 168 hours. No results for input(s): AMMONIA in the last 168 hours. Coagulation Profile: No results for input(s): INR, PROTIME in the last 168 hours. Cardiac Enzymes: No results for input(s): CKTOTAL, CKMB, CKMBINDEX, TROPONINI  in the last 168 hours. BNP (last 3 results) Recent Labs    11/13/18 1434  PROBNP 1,147.0*   HbA1C: No results for input(s): HGBA1C in the last 72 hours. CBG: Recent Labs  Lab 11/14/18 1639 11/14/18 2034 11/15/18 0527 11/15/18 0754 11/15/18 1209  GLUCAP 129* 150* 142* 113* 183*   Lipid Profile: No results for input(s): CHOL, HDL, LDLCALC, TRIG, CHOLHDL, LDLDIRECT in the last 72 hours. Thyroid Function Tests: No results for input(s): TSH, T4TOTAL, FREET4, T3FREE, THYROIDAB in the last 72 hours. Anemia Panel: No results for input(s): VITAMINB12, FOLATE, FERRITIN, TIBC, IRON, RETICCTPCT in the last 72 hours. Sepsis Labs: No results for input(s): PROCALCITON, LATICACIDVEN in the last 168 hours.  Recent Results (from the past 240 hour(s))  Novel Coronavirus, NAA ([facility])     Status: None   Collection Time: 11/13/18 12:00 AM  Result Value Ref Range Status   SARS-CoV-2, NAA Not Detected Not Detected Final    Comment: This test was developed and its performance characteristics determined by [employer]. This test has not been FDA cleared or approved. This test has been authorized by FDA under an Emergency  Use Authorization (EUA). This test is only authorized for the duration of time the declaration that circumstances exist justifying the authorization of the emergency use of in vitro diagnostic tests for detection of SARS-CoV-2 virus and/or diagnosis of COVID-19 infection under section 564(b)(1) of the Act, 21 U.S.C. 818EXH-3(Z)(1), unless the authorization is terminated or revoked sooner. When diagnostic testing is negative, the possibility of Caitlin Oliver false negative result should be considered in the context of Caitlin Oliver patient's recent exposures and the presence of clinical signs and symptoms consistent with COVID-19. An individual without symptoms of COVID-19 and who is not shedding SARS-CoV-2 virus would expect to have Caitlin Felicetti negative (not detected) result in this assay.   SARS Coronavirus 2 (CEPHEID - Performed in [facility] hospital lab), Hosp Order     Status: None   Collection Time: 11/13/18  8:12 PM   Specimen: Nasopharyngeal Swab  Result Value Ref Range Status   SARS Coronavirus 2 NEGATIVE NEGATIVE Final    Comment: (NOTE) If result is NEGATIVE SARS-CoV-2 target nucleic acids are NOT DETECTED. The SARS-CoV-2 RNA is generally detectable in upper and lower  respiratory specimens during the acute phase of infection. The lowest  concentration of SARS-CoV-2 viral copies this assay can detect is 250  copies / mL. Caitlin Oliver negative result does not preclude SARS-CoV-2 infection  and should not be used as the sole basis for treatment or other  patient management decisions.  Caitlin Oliver negative result may occur with  improper specimen collection / handling, submission of specimen other  than nasopharyngeal swab, presence of viral mutation(s) within the  areas targeted by this assay, and inadequate number of viral copies  (<250 copies / mL). Caitlin Oliver negative result must be combined with clinical  observations, patient history, and epidemiological information. If result is POSITIVE SARS-CoV-2 target nucleic acids are  DETECTED. The SARS-CoV-2 RNA is generally detectable in upper and lower  respiratory specimens dur ing the acute phase of infection.  Positive  results are indicative of active infection with SARS-CoV-2.  Clinical  correlation with patient history and other diagnostic information is  necessary to determine patient infection status.  Positive results do  not rule out bacterial infection or co-infection with other viruses. If result is PRESUMPTIVE POSTIVE SARS-CoV-2 nucleic acids MAY BE PRESENT.   Caitlin Oliver presumptive positive result was obtained on the submitted specimen  and  confirmed on repeat testing.  While 2019 novel coronavirus  (SARS-CoV-2) nucleic acids may be present in the submitted sample  additional confirmatory testing may be necessary for epidemiological  and / or clinical management purposes  to differentiate between  SARS-CoV-2 and other Sarbecovirus currently known to infect humans.  If clinically indicated additional testing with an alternate test  methodology [phone]) is advised. The SARS-CoV-2 RNA is generally  detectable in upper and lower respiratory sp ecimens during the acute  phase of infection. The expected result is Negative. Fact Sheet for Patients:  StrictlyIdeas.no Fact Sheet for Healthcare Providers: BankingDealers.co.za This test is not yet approved or cleared by the [place] FDA and has been authorized for detection and/or diagnosis of SARS-CoV-2 by FDA under an Emergency Use Authorization (EUA).  This EUA will remain in effect (meaning this test can be used) for the duration of the COVID-19 declaration under Section 564(b)(1) of the Act, 21 U.S.C. section 360bbb-3(b)(1), unless the authorization is terminated or revoked sooner. Performed at [facility], [place] [address], [place], [place] [address]          Radiology Studies: Dg Chest 2 View  Result Date: 11/13/2018 CLINICAL  DATA:  Acute shortness of breath EXAM: CHEST - 2 VIEW COMPARISON:  06/18/2018 CT, 05/13/2018 chest radiograph and prior studies FINDINGS: Cardiomegaly and mild pulmonary vascular congestion noted. Small bilateral pleural effusions are noted, increased on the RIGHT and stable on the LEFT since 06/18/2018. Mild bibasilar atelectasis noted. No pneumothorax or acute bony abnormality. IMPRESSION: 1. Small bilateral pleural effusions, increased on the RIGHT and stable on the LEFT since 06/18/2018. Mild bibasilar atelectasis. 2. Cardiomegaly and mild pulmonary vascular congestion. Electronically Signed   By: Margarette Canada M.D.   On: 11/13/2018 16:23        Scheduled Meds:  aspirin  81 mg Oral Daily   bisacodyl  10 mg Rectal Once   budesonide (PULMICORT) nebulizer solution  0.25 mg Nebulization BID   calcium carbonate  1,250 mg Oral QAC supper   cephALEXin  500 mg Oral Q6H   clopidogrel  75 mg Oral Daily   diltiazem  180 mg Oral Daily   furosemide  40 mg Intravenous BID   heparin  5,000 Units Subcutaneous Q8H   insulin aspart  0-5 Units Subcutaneous QHS   insulin aspart  0-9 Units Subcutaneous TID WC   insulin glargine  8 Units Subcutaneous QHS   lisinopril  10 mg Oral Daily   magnesium oxide  200 mg Oral QAC supper   metoprolol tartrate  50 mg Oral BID WC   omega-3 acid ethyl esters  1 g Oral Daily   pantoprazole  40 mg Oral BID   polyethylene glycol  17 g Oral BID   pravastatin  40 mg Oral QAC supper   sodium chloride flush  3 mL Intravenous Q12H   traZODone  50 mg Oral QHS   umeclidinium-vilanterol  1 puff Inhalation Daily   Continuous Infusions:  sodium chloride       LOS: 2 days    Time spent: over 30 min    Fayrene Helper, MD [facility] Pager AMION  If 7PM-7AM, please contact night-coverage [email or web address] Password [email or web address] 11/15/2018, 2:02 PM

## 2018-11-15 NOTE — Plan of Care (Signed)
  Problem: Health Behavior/Discharge Planning: Goal: Ability to manage health-related needs will improve Outcome: Progressing   Problem: Clinical Measurements: Goal: Respiratory complications will improve Outcome: Progressing   Problem: Activity: Goal: Risk for activity intolerance will decrease Outcome: Progressing   Problem: Safety: Goal: Ability to remain free from injury will improve Outcome: Progressing

## 2018-11-15 NOTE — Evaluation (Signed)
Physical Therapy Evaluation Patient Details Name: Caitlin Oliver MRN: [id number] DOB: 08/31/1940 Today's Date: 11/15/2018   History of Present Illness  Caitlin Oliver is a 78 y.o. female with medical history significant of advanced COPD followed by pulmonology, diastolic dysfunction CHF with EF in March of this year showing 60%, anxiety disorder, peripheral vascular disease, diabetes, GERD, hyperlipidemia, hypertension who presented to the ER with progressive shortness of breath leg swelling and cough.  Patient had CVA in March of this year.  Clinical Impression  Pt admitted with above diagnosis. Pt currently with functional limitations due to the deficits listed below (see PT Problem List).  Pt will benefit from skilled PT to increase their independence and safety with mobility to allow discharge to the venue listed below.  Pt lives alone with 6 steps to enter.  She has poor activity tolerance and requires frequent rest breaks.  Recommend SNF at this time.  She is hopeful to return home.  If she refuses SNF, she will need HHPT.     Follow Up Recommendations SNF;Supervision for mobility/OOB    Equipment Recommendations  None recommended by PT    Recommendations for Other Services       Precautions / Restrictions Precautions Precautions: Fall Restrictions Weight Bearing Restrictions: No      Mobility  Bed Mobility               General bed mobility comments: Pt up in chair upon arrival  Transfers Overall transfer level: Needs assistance Equipment used: Rolling walker (2 wheeled) Transfers: Sit to/from Stand Sit to Stand: Min assist         General transfer comment: Poor use of UE and not following instructions for hand placement  Ambulation/Gait Ambulation/Gait assistance: Min assist Gait Distance (Feet): 45 Feet(plus 25' and 20') Assistive device: Rolling walker (2 wheeled) Gait Pattern/deviations: Decreased stride length;Trunk flexed     General Gait Details:  Pt required 2 standing rest breaks during gait with o2 at 2 L/min. Heavy reliance on RW.  Stairs            Wheelchair Mobility    Modified Rankin (Stroke Patients Only)       Balance Overall balance assessment: Needs assistance         Standing balance support: Bilateral upper extremity supported Standing balance-Leahy Scale: Poor Standing balance comment: requires UE support                             Pertinent Vitals/Pain Pain Assessment: 0-10 Pain Score: 7  Pain Location: abd, head Pain Descriptors / Indicators: Pressure Pain Intervention(s): Limited activity within patient's tolerance;Monitored during session    Home Living Family/patient expects to be discharged to:: Private residence Living Arrangements: Alone Available Help at Discharge: Family;Available PRN/intermittently(is on a list to get aides to help) Type of Home: House Home Access: Stairs to enter Entrance Stairs-Rails: Right Entrance Stairs-Number of Steps: 6 Home Layout: One level Home Equipment: Bowman - 2 wheels;Shower seat;Grab bars - tub/shower;Hand held Tourist information centre manager - 4 wheels Additional Comments: home o2 2L/min    Prior Function Level of Independence: Independent with assistive device(s)         Comments: Amb with rollator     Hand Dominance   Dominant Hand: Right    Extremity/Trunk Assessment   Upper Extremity Assessment Upper Extremity Assessment: Generalized weakness(c/o arm weakness and some pain at IV site. Nurse attended to IV site.)    Lower  Extremity Assessment Lower Extremity Assessment: Generalized weakness       Communication      Cognition Arousal/Alertness: Awake/alert Behavior During Therapy: WFL for tasks assessed/performed Overall Cognitive Status: Within Functional Limits for tasks assessed                                        General Comments      Exercises     Assessment/Plan    PT Assessment Patient  needs continued PT services  PT Problem List Decreased strength;Decreased activity tolerance;Decreased balance;Decreased mobility       PT Treatment Interventions DME instruction;Gait training;Functional mobility training;Therapeutic activities;Therapeutic exercise;Balance training;Stair training    PT Goals (Current goals can be found in the Care Plan section)  Acute Rehab PT Goals Patient Stated Goal: go home if possible PT Goal Formulation: With patient Time For Goal Achievement: 11/29/18 Potential to Achieve Goals: Good    Frequency Min 3X/week   Barriers to discharge        Co-evaluation               AM-PAC PT "6 Clicks" Mobility  Outcome Measure Help needed turning from your back to your side while in a flat bed without using bedrails?: A Lot Help needed moving from lying on your back to sitting on the side of a flat bed without using bedrails?: A Lot Help needed moving to and from a bed to a chair (including a wheelchair)?: A Little Help needed standing up from a chair using your arms (e.g., wheelchair or bedside chair)?: A Little Help needed to walk in hospital room?: A Little Help needed climbing 3-5 steps with a railing? : A Lot 6 Click Score: 15    End of Session Equipment Utilized During Treatment: Gait belt Activity Tolerance: Patient tolerated treatment well Patient left: Other (comment);with call bell/phone within reach(on [facility]- nursing aware) Nurse Communication: Mobility status PT Visit Diagnosis: Unsteadiness on feet (R26.81);Muscle weakness (generalized) (M62.81);Difficulty in walking, not elsewhere classified (R26.2)    Time: 2505-3976 PT Time Calculation (min) (ACUTE ONLY): 34 min   Charges:   PT Evaluation $PT Eval Moderate Complexity: 1 Mod PT Treatments $Gait Training: 8-22 mins        Jarrah Babich L. Tamala Julian, [place] Pager [phone] 11/15/2018   Galen Manila 11/15/2018, 2:00 PM

## 2018-11-16 LAB — URINALYSIS, ROUTINE W REFLEX MICROSCOPIC
Bacteria, UA: NONE SEEN
Bilirubin Urine: NEGATIVE
Glucose, UA: NEGATIVE mg/dL
Hgb urine dipstick: NEGATIVE
Ketones, ur: NEGATIVE mg/dL
Nitrite: NEGATIVE
Protein, ur: NEGATIVE mg/dL
Specific Gravity, Urine: 1.015 (ref 1.005–1.030)
pH: 6 (ref 5.0–8.0)

## 2018-11-16 LAB — COMPREHENSIVE METABOLIC PANEL WITH GFR
ALT: 13 U/L (ref 0–44)
AST: 20 U/L (ref 15–41)
Albumin: 3.9 g/dL (ref 3.5–5.0)
Alkaline Phosphatase: 45 U/L (ref 38–126)
Anion gap: 12 (ref 5–15)
BUN: 19 mg/dL (ref 8–23)
CO2: 39 mmol/L — ABNORMAL HIGH (ref 22–32)
Calcium: 9.3 mg/dL (ref 8.9–10.3)
Chloride: 89 mmol/L — ABNORMAL LOW (ref 98–111)
Creatinine, Ser: 0.85 mg/dL (ref 0.44–1.00)
GFR calc Af Amer: >60 mL/min
GFR calc non Af Amer: >60 mL/min
Glucose, Bld: 126 mg/dL — ABNORMAL HIGH (ref 70–99)
Potassium: 3.8 mmol/L (ref 3.5–5.1)
Sodium: 140 mmol/L (ref 135–145)
Total Bilirubin: 0.5 mg/dL (ref 0.3–1.2)
Total Protein: 7 g/dL (ref 6.5–8.1)

## 2018-11-16 LAB — CBC
HCT: 42.6 % (ref 36.0–46.0)
Hemoglobin: 12.3 g/dL (ref 12.0–15.0)
MCH: 26.5 pg (ref 26.0–34.0)
MCHC: 28.9 g/dL — ABNORMAL LOW (ref 30.0–36.0)
MCV: 91.6 fL (ref 80.0–100.0)
Platelets: 275 10*3/uL (ref 150–400)
RBC: 4.65 MIL/uL (ref 3.87–5.11)
RDW: 13.7 % (ref 11.5–15.5)
WBC: 8.3 10*3/uL (ref 4.0–10.5)
nRBC: 0 % (ref 0.0–0.2)

## 2018-11-16 LAB — GLUCOSE, CAPILLARY
Glucose-Capillary: 191 mg/dL — ABNORMAL HIGH (ref 70–99)
Glucose-Capillary: 126 mg/dL — ABNORMAL HIGH (ref 70–99)
Glucose-Capillary: 179 mg/dL — ABNORMAL HIGH (ref 70–99)
Glucose-Capillary: 122 mg/dL — ABNORMAL HIGH (ref 70–99)

## 2018-11-16 LAB — MAGNESIUM: Magnesium: 2 mg/dL (ref 1.7–2.4)

## 2018-11-16 MED ORDER — MUSCLE RUB 10-15 % EX CREA
TOPICAL_CREAM | CUTANEOUS | Status: DC | PRN
  Administered 2018-11-16 (×2): via TOPICAL
  Filled 2018-11-16: qty 85

## 2018-11-16 NOTE — Discharge Instructions (Signed)
Low-Sodium Nutrition Therapy Eating less sodium can help you if you have high blood pressure, heart failure, or kidney or liver disease. Your body needs a little sodium, but too much sodium can cause your body to hold onto extra water. This extra water will raise your blood pressure and can cause damage to your heart, kidneys, or liver as they are forced to work harder. Sometimes you can see how the extra fluid affects you because your hands, legs, or belly swell.  You may also hold water around your heart and lungs, which makes it hard to breathe. Even if you take medication for blood pressure or a water pill (diuretic) to remove fluid, it is still important to have less salt in your diet. Check with your primary care provider before drinking alcohol since it may affect the amount of fluid in your body and how your heart, kidneys, or liver work.  Sodium in Food A low-sodium meal plan limits the sodium that you get from food and beverages to 1,500-2,000 milligrams (mg) per day.  Salt is the main source of sodium. Read the nutrition label on the package to find out how much sodium is in one serving of a food. Select foods with 140 milligrams (mg) of sodium or less per serving. You may be able to eat one or two servings of foods with a little more than 140 milligrams (mg) of sodium if you are closely watching how much sodium you eat in a day. Check the serving size on the label. The amount of sodium listed on the label shows the amount in one serving of the food.  So, if you eat more than one serving, you will get more sodium than the amount listed.  Cutting Back on Sodium Eat more fresh foods. Fresh fruits and vegetables are low in sodium, as well as frozen vegetables and fruits that have no added juices or sauces. Fresh meats are lower in sodium than processed meats, such as bacon, sausage, and hotdogs. Not all processed foods are unhealthy, but some processed foods may have too much sodium. Eat  less salt at the table and when cooking.  One of the ingredients in salt is sodium. One teaspoon of table salt has 2,300 milligrams of sodium. Leave the salt out of recipes for pasta, casseroles, and soups. Be a Paramedic. Food packages that say Salt-free, sodium-free, very low sodium, and low sodium have less than 140 milligrams of sodium per serving. Beware of products identified as Unsalted, No Salt Added, Reduced Sodium, or Lower Sodium. These items may still be high in sodium. You should always check the nutrition label. Add flavors to your food without adding sodium. Try lemon juice, lime juice, or vinegar. Dry or fresh herbs add flavor. Buy a sodium-free seasoning blend or make your own at home. You can purchase salt-free or sodium-free condiments like barbeque sauce in stores and online.  Eating in Restaurants Choose foods carefully when you eat outside your home. Restaurant foods can be very high in sodium.  Many restaurants provide nutrition facts on their menus or their websites.  If you cannot find that information, ask your server.  Let your server know that you want your food to be cooked without salt and that you would like your salad dressing and sauces to be served on the side.  Foods Recommended Grains Bread, bagels, rolls without salted tops Homemade bread made with reduced-sodium baking powder Cold cereals, especially shredded wheat and puffed rice Oats, grits, or cream  of wheat Pastas, quinoa, and rice Popcorn, pretzels or crackers without salt Corn tortillas Protein Foods Fresh meats and fish; [place] bacon (check the nutrition labels - make sure they are not packaged in a sodium solution) Canned or packed tuna (no more than 4 ounces at 1 serving) Beans and peas Soybeans) and tofu Eggs Nuts or nut butters without salt Dairy Milk or milk powder Plant milks, such as rice and soy Yogurt, including Greek yogurt Small amounts of natural  cheese (blocks of cheese) or reduced-sodium cheese can be used in moderation. (Swiss, ricotta, and fresh mozzarella cheese are lower in sodium than the others) Cream cheese Low sodium cottage cheese Vegetables Fresh and frozen vegetables without added sauces or salt Homemade soups (without salt) Low-sodium, salt-free or sodium-free canned vegetables and soups Fruit Fresh and canned fruits Dried fruits, such as raisins, cranberries, and prunes Oils Tub or liquid margarine, regular or without salt Canola, corn, peanut, olive, safflower, or sunflower oils Condiments Fresh or dried herbs such as basil, bay leaf, dill, mustard (dry), nutmeg, paprika, parsley, rosemary, sage, or thyme. Low sodium ketchup Vinegar Lemon or lime juice Pepper, red pepper flakes, and cayenne. Hot sauce contains sodium, but if you use just a drop or two, it will not add up to much. Salt-free or sodium-free seasoning mixes and marinades Simple salad dressings: vinegar and oil  Foods Not Recommended Grains Breads or crackers topped with salt Cereals (hot/cold) with more than 300 mg sodium per serving Biscuits, cornbread, and other quick breads prepared with baking soda Pre-packaged bread crumbs Seasoned and packaged rice and pasta mixes Self-rising flours Protein Foods Cured meats: Bacon, ham, sausage, pepperoni and hot dogs Canned meats (chili, vienna sausage, or sardines) Smoked fish and meats Frozen meals that have more than 600 mg of sodium per serving Egg substitute (with added sodium) Dairy Buttermilk Processed cheese spreads Cottage cheese (1 cup may have over 500 mg of sodium; look for low-sodium.) American or feta cheese Shredded Cheese has more sodium than blocks of cheese String cheese Vegetables Canned vegetables (unless they are salt-free, sodium-free or low sodium) Frozen vegetables with seasoning and sauces Sauerkraut and pickled vegetables Canned or dried soups (unless they are  salt-free, sodium-free, or low sodium) [place] fries and onion rings Fruit  Dried fruits preserved with additives that have sodium Oils  Salted butter or margarine, all types of olives Condiments Salt, sea salt, kosher salt, onion salt, and garlic salt Seasoning mixes with salt Bouillon cubes Ketchup Barbeque sauce and Worcestershire sauce unless low sodium Soy sauce Salsa, pickles, olives, relish Salad dressings: ranch, blue cheese, [place], and [place].  Low Sodium Sample 1-Day Menu Breakfast 1 cup cooked oatmeal 1 slice whole wheat bread toast 1 tablespoon peanut butter without salt 1 banana 1 cup 1% milk Lunch Tacos made with: 2 corn tortillas  cup black beans, low sodium  cup roasted or grilled chicken (without skin)  avocado Squeeze of lime juice 1 cup salad greens 1 tablespoon low-sodium salad dressing  cup strawberries 1 orange Afternoon Snack 1/3 cup grapes 6 ounces yogurt Evening Meal 3 ounces herb-baked fish 1 baked potato 2 teaspoons olive oil  cup cooked carrots 2 thick slices tomatoes on: 2 lettuce leaves 1 teaspoon olive oil 1 teaspoon balsamic vinegar 1 cup 1% milk Evening Snack 1 apple  cup almonds without salt   Sodium-Free Flavoring Tips When cooking, the following items may be used for flavoring instead of salt or seasonings that contain sodium. Remember: A little bit of  spice goes a long way! Be careful not to overseason. Spice Blend Recipe (makes about 1/2 cup) 5 teaspoons onion powder 2 teaspoons garlic powder 2 teaspoons paprika 2 teaspoon dry mustard 1 teaspoon crushed thyme leaves  teaspoon white pepper  teaspoon celery seed  Food Item Flavorings Beef  Basil, bay leaf, caraway, curry, dill, dry mustard, garlic, grape jelly, green pepper, mace, marjoram, mushrooms (fresh), nutmeg, onion or onion powder, parsley, pepper, rosemary, sage Chicken  Basil, cloves, cranberries, mace, mushrooms (fresh), nutmeg, oregano,  paprika, parsley, pineapple, saffron, sage, savory, tarragon, thyme, tomato, turmeric Egg  Chervil, curry, dill, dry mustard, garlic or garlic powder, green pepper, jelly, mushrooms (fresh), nutmeg, onion powder, paprika, parsley, rosemary, tarragon, tomato Fish  Basil, bay leaf, chervil, curry, dill, dry mustard, green pepper, lemon juice, marjoram, mushrooms (fresh), paprika, pepper, tarragon, tomato, turmeric Lamb  Cloves, curry, dill, garlic or garlic powder, mace, mint, mint jelly, onion, oregano, parsley, pineapple, rosemary, tarragon, thyme Pork  Applesauce, basil, caraway, chives, cloves, garlic or garlic powder, onion or onion powder, rosemary, thyme Veal  Apricots, basil, bay leaf, currant jelly, curry, ginger, marjoram, mushrooms (fresh), oregano, paprika Vegetables  Basil, dill, garlic or garlic powder, ginger, lemon juice, mace, marjoram, nutmeg, onion or onion powder, tarragon, tomato, sugar or sugar substitute, salt-free salad dressing, vinegar Desserts  Allspice, anise, cinnamon, cloves, ginger, mace, nutmeg, vanilla extract, other extracts   Sodium (Salt) Content of Foods Eating more than the serving size for a moderate or low-sodium food will make it a high-sodium food. Foods made with high-sodium foods will also be high in sodium. Unless otherwise noted, all foods are cooked: meat is roasted, fish is cooked with dry heat, and vegetables are cooked from fresh and fruit is raw. This is a guide. Actual values may vary depending on product and/or processing.  Canned and processed foods may have a higher sodium content. Values are rounded to the nearest 5-milligram (mg) increment and may be averaged with similar foods in the group.  High Sodium (more than 300 mg) Food Serving Milligrams (mg) Bacon 2 slices 564 Bagel, 4?: egg 1 each 450 Bagel, 4?: plain, onion, or seeded 1 each 400 Barbecue sauce 2 Tbsp 350 Beans, baked, plain  cup 435 Beans, garbanzo  cup 360 Beans,  kidney, canned  cup 440 Beans, lima, canned  cup 405 Beans, white, canned  cup 445 Beef, dried 1 oz. 790 Biscuit, 2? 1 each 350 Catsup 2 Tbsp 335 Cheese, American 1 oz 400 Cheese, cottage  cup 460 Cheese, feta 1 oz 315 Corn, creamed, canned  cup 365 Croissant 2 oz 425 Fish, salmon, canned 3 oz 470 Fish, salmon, smoked 3 oz 670 Fish, sardines, canned 3 oz 430 Frankfurter, beef or pork 1 each 510 Ham 3 oz 1,125 Lobster 3 oz 325 Miso  cup 1,280 Mushrooms, canned  cup 330 Pickle, dill 1 large 570 Potatoes, au gratin or scalloped  cup 500 Pretzels 1 oz 400 Pudding, instant, chocolate, prepared with milk  cup 420 Salad dressing, [place], commercial 2 Tbsp 485 Salami, dry or hard 1 oz 600 Salt, table 1 tsp 2,325 Sauerkraut, canned  cup 780 Soup, canned 1 cup 700-1,000 Soy sauce 1 Tbsp 900 Spinach, canned, drained  cup 345 Teriyaki sauce 1 Tbsp 690 Tomato or vegetable juice, canned  cup 325 Tomato sauce, canned  cup 640 Tomato sauce, spaghetti or marinara  cup 510 Vegetable or soy patty 1 each 380  Moderate Sodium (140-300 mg) Food Serving Milligrams (mg) Asparagus, canned 4  spears 205 Beans, green or yellow, canned  cup 175 Beets, canned  cup 160 Bologna, pork and beef 1 oz 210 Bread, pita, 4? 1 each 150 Bread, pumpernickel or rye 1 slice 242 Bread, white 1 slice 353 Carrots, canned  cup 175 Cereal, raisin bran  cup 175 Cheese: mu[place], mozzarella, cheddar 1 oz 175 Cheese, Parmesan 2 Tbsp 150 Cheese, provolone, part-skim 1 oz 250 Cheese, ricotta  cup 155 Corn, canned  cup 285 Crab, canned 3 oz 240 English muffin 1 each 250 French fries 10 fries 200 Greens, beet  cup 175 Milk, buttermilk 1 cup 260 Milk, chocolate 1 cup 165 Milkshake 8 oz 240 Muffin 2 oz 250 Nuts, mixed, salted 1 oz 190 Olives, ripe, canned 5 large 190 Pancake or waffle, 4? 1 each 240 Peanuts, salted 1 oz 230 Peas, green, canned  cup 215 Potato chips 1 oz 190 Potatoes,  mashed, prepared from dry mix  cup 170 Pudding, ready-to-eat  cup 160 Pudding, vanilla, from mix  cup 225 Roll, hot dog or hamburger 1 each 205 Salad dressing 2 Tbsp 200-300 Salsa 2 Tbsp 195 Sausage, pork 1 oz 200 Tomatoes, canned  cup 170 Tomatoes, stewed, canned  cup 280 Tortilla, flour, 6? 1 each 205 Tuna, canned in water 3 oz 290 Yogurt, plain or fruited 8 oz 100-175  Low Sodium (less than 140 mg) Food Serving Milligrams (mg) Bread, Italian 1 slice 614 Bread, wheat 1 slice 431 Butter, salted 1 Tbsp 90 Cereal, breakfast: corn, bran, or wheat  cup 100-150 Cheese, Swiss 1 oz 55 Egg substitute, liquid  cup 120 Egg, whole 1 large 70 Fish: pollock, swordfish, perch, cod, halibut, roughy, salmon 3 oz 60-100 Frozen yogurt  cup 65 Gelatin, prepared from mix  cup 100 Ice cream  cup 55 Margarine, regular 1 Tbsp 135 Milk, all types 1 cup 100 Milk, evaporated, canned  cup 135 Mustard 1 tsp 55 Peanut butter 1 Tbsp 75 Peas, green, frozen  cup 60 Seeds, sunflower 1 oz 115 Soy milk 1 cup 125 Spinach  cup 65 Spinach, frozen  cup 90 Sweet potato, baked in skin 1 medium 40 [place], light or dark meat 3 oz 60 Yogurt, plain or fruited 8 oz 100-175  Very Low Sodium (less than 35 mg) Food Serving Milligrams (mg) Apricots, canned  cup 5 Beef, ground 1 oz. 20 Beer, regular 12 oz 15 Broccoli  cup 30 Broccoli, raw  cup 15 Brussels sprouts  cup 15 Cabbage, raw or cooked  cup 5 Carbonated beverages 12 oz 20-40 Cauliflower  cup 10 Cauliflower, raw  cup 15 Dried beans and peas  cup 5-20 Greens: beet, collard, mustard  cup 10-20 Honeydew  cup 30 Lettuce, leaf 1 cup 15 Noodles  cup 10 Oatmeal  cup 5 Peaches, canned  cup 5 Pears, canned  cup 5 Pork 1 oz 25 Potato, baked with skin 1 medium 20 Rice, brown or wild  cup 5 Sherbet  cup 35 Soybeans  cup 15 Copyright Academy of Nutrition and Dietetics. This handout may be duplicated for client education. Page  5/5 Spinach, raw 1 cup 25 Tofu, firm  cup 10 Wine, table, all types 5 oz 10 Sodium Free (less than 5 mg) Food Serving Avocado 1 oz Beans: navy, black, pinto  cup Nuts: almonds, pecans, or walnuts, unsalted 1 oz Oil, all types 1 Tbsp Popcorn, air popped 1 cup Raisins, seedless  cup Rice, white  cup Tomato, raw 1 medium Fruit and juices not previously listed 1 piece or  cup Vegetables not previously listed  cup

## 2018-11-16 NOTE — TOC Initial Note (Signed)
Transition of Care [facility]) - Initial/Assessment Note    Patient Details  Name: Caitlin Oliver MRN: [id number] Date of Birth: 07/12/1940  Transition of Care [facility]) CM/SW Contact:    Elliot Gurney [place], [place] Phone Number: 11/16/2018, 4:42 PM  Clinical Narrative:                 Patient is a 78 year old female. Per EMR, Patient has evidence of fluid overload and is being admitted with a diagnosis of acute exacerbation of diastolic CHF. This Education officer, museum discussed role and recommendation from PT that she transfer to a rehab following her hospital stay. Patient is adamant that she would like to return home, however did want this social worker to speak to her niece Prince Solian who is a Marine scientist. This Education officer, museum spoke with [name], who would like to honor patient's wishes of returning home but would also agree with SNF if this would be the best plan for patient. Patient's niece discussed a negative experience that patient had at a SNF in the past and is concerned. She is awaiting a return call from the doctor to discuss recommendations. [name] confirmed that patient had [place] services through [facility] before this current admission and would possibly like this to resume adding a in home aid for added support. She will continue to check in on patient regularly but she does work during the day. Per patient's niece, she and patient will make their final decision once she has spoken to patient's doctor.   Expected Discharge Plan: [facility](patient and niece are discussing final plan of HH vs, SNF) Barriers to Discharge: No Barriers Identified   Patient Goals and CMS Choice Patient states their goals for this hospitalization and ongoing recovery are:: Patient states that she would "like the fluid drawn off so I can can return home"      Expected Discharge Plan and Services Expected Discharge Plan: [place] [facility](patient and niece are discussing final plan of HH vs,  SNF) In-house Referral: Clinical Social Work   Post Acute Care Choice: [place], [place] Living arrangements for the past 2 months: [place] Expected Discharge Date: 11/17/18                                    Prior Living Arrangements/Services Living arrangements for the past 2 months: Single Family Home Lives with:: Self Patient language and need for interpreter reviewed:: No Do you feel safe going back to the place where you live?: Yes      Need for Family Participation in Patient Care: Yes (Comment) Care giver support system in place?: Yes (comment) Current home services: Home OT, Home PT(HH services through [facility]) Criminal Activity/Legal Involvement Pertinent to Current Situation/Hospitalization: No - Comment as needed  Activities of Daily Living Home Assistive Devices/Equipment: Raised toilet seat with rails, Shower chair with back, Walker (specify type) ADL Screening (condition at time of admission) Patient's cognitive ability adequate to safely complete daily activities?: Yes Is the patient deaf or have difficulty hearing?: No Does the patient have difficulty seeing, even when wearing glasses/contacts?: No Does the patient have difficulty concentrating, remembering, or making decisions?: No Patient able to express need for assistance with ADLs?: Yes Does the patient have difficulty dressing or bathing?: No Independently performs ADLs?: Yes (appropriate for developmental age) Does the patient have difficulty walking or climbing  stairs?: Yes Weakness of Legs: Both Weakness of Arms/Hands: Both  Permission Sought/Granted Permission sought to share information with : Family Supports Permission granted to share information with : Yes, Verbal Permission Granted  Share Information with NAME: Prince Solian     Permission granted to share info w Relationship: niece  Permission granted to share info w Contact Information:  [phone]  Emotional Assessment Appearance:: Appears stated age Attitude/Demeanor/Rapport: Self-Confident Affect (typically observed): Appropriate Orientation: : Oriented to Self, Oriented to Place, Oriented to  Time, Oriented to Situation Alcohol / Substance Use: Not Applicable Psych Involvement: No (comment)  Admission diagnosis:  Acute on chronic congestive heart failure, unspecified heart failure type [facility]) [I50.9] Patient Active Problem List   Diagnosis Date Noted  . Acute on chronic combined systolic and diastolic CHF (congestive heart failure) ([place]) 11/13/2018  . Acute CVA (cerebrovascular accident) ([place]) 06/17/2018  . Syncope 06/16/2018  . Chest pain 06/16/2018  . Pain 09/23/2017  . Cervicalgia 07/16/2017  . Pain in both upper extremities 06/03/2017  . Pain in both lower extremities 06/03/2017  . Bilateral carotid artery stenosis 06/03/2017  . Benign paroxysmal positional vertigo 10/23/2016  . Headache 06/19/2016  . Cancer of left lung ([place]) 02/03/2016  . COPD GOLD II 12/08/2015  . Solitary pulmonary nodule 12/08/2015  . Bilateral occipital neuralgia 11/22/2015  . Double vision 10/28/2015  . TIA (transient ischemic attack) 10/28/2015  . Subclavian artery stenosis, left ([place]) 10/28/2015  . Subclavian steal syndrome 10/28/2015  . Acute blood loss anemia 10/20/2013  . C. difficile colitis 10/19/2013  . GI bleed 10/18/2013  . COPD 10/18/2013  . Bleeding gastrointestinal 10/18/2013  . Hypoxia 10/10/2013  . Bronchitis 10/08/2013  . PVD (peripheral vascular disease) with claudication ([place]) 07/29/2013  . Carotid stenosis 05/07/2013  . Stenosis of left carotid artery 05/07/2013  . Occlusion and stenosis of carotid artery without mention of cerebral infarction 03/02/2013  . Carotid artery obstruction 03/02/2013  . Carotid artery disease ([place]) 02/27/2013  . Tobacco abuse 02/27/2013  . Compulsive tobacco user syndrome 02/27/2013  . Claudication ([place]) 01/20/2013  .  Essential hypertension 01/20/2013  . Type 2 diabetes mellitus ([place]) 01/20/2013  . Hyperlipidemia 01/20/2013   PCP:  [name], PA-C Pharmacy:   [facility]) - [place], [place] [address] Phone: [phone] Fax: [place], [place] - [address] [place] [address] Phone: [phone] Fax: [place], [place] - [address] [place] [address] [place] [address] Phone: [phone] Fax: [phone]     Social Determinants of Health (SDOH) Interventions    Readmission Risk Interventions No flowsheet data found.    Elliot Gurney, [place] Work [phone]

## 2018-11-16 NOTE — Progress Notes (Signed)
Nutrition Education Note RD working remotely.   RD consulted for nutrition education regarding CHF/low sodium diet.  Handouts from the Academy of Nutrition and Dietetics placed in Discharge Instructions: " Low Sodium Nutrition Therapy," "Sodium Free Flavoring Tips," and "Sodium Content of Foods."   Reviewed patient's dietary recall. Provided examples on ways to decrease sodium intake in diet. Discouraged intake of processed foods and use of salt shaker. Encouraged fresh fruits and vegetables as well as whole grain sources of carbohydrates to maximize fiber intake.   RD discussed why it is important for patient to adhere to diet recommendations, and emphasized the role of fluids, foods to avoid, and importance of weighing self daily. Teach back method used.  Expect good to fair compliance.  Body mass index is 31 kg/m. Pt meets criteria for obesity based on current BMI.  Current diet order is Heart Healthy/Carb Modified, patient is consuming 100% of meals at this time. Labs and medications reviewed. No further nutrition interventions warranted at this time. RD contact information provided. If additional nutrition issues arise, please re-consult RD.     Jarome Matin, MS, RD, LDN, [facility] Inpatient Clinical Dietitian Pager # [phone] After hours/weekend pager # [phone]

## 2018-11-16 NOTE — Progress Notes (Addendum)
**Note De-Identified vi Obfusction** PROGRESS NOTE    Caitlin Oliver  ZJI:[id number] DOB: 08-17-1940 DOA: 11/13/2018 PCP: Aleth Hlim., PA-C   Brief Nrrtive:  Caitlin Oliver is  78 y.o. femle with medicl history significnt of dvnced COPD followed by pulmonology, distolic dysfunction CHF with EF in Mrch of this yer showing 60%, nxiety disorder, peripherl vsculr disese, dibetes, GERD, hyperlipidemi, hypertension who presented to the ER with progressive shortness of breth leg swelling nd cough.  Ptient hd CVA in Mrch of this yer.  She lso hs hd lung cncer s well s coronry rtery disese.  All these hve been under control.  She hs been on 2 L of oxygen t home since Mrch of this yer.  Ptient hs noticed worsening shortness of breth in the lst 2 weeks including oxygen dropping into the 70s sturtion.  Also minly exertionl.  Some PND nd orthopne.  She ws seen in the outptient setting ws on 20 mg of Lsix which ws incresed to 40 mg 2 dys go.  She hd incresed urintion but still continues to hve more shortness of breth nd leg swelling.  She cme to the ER where she ws evluted.  Ptient hs evidence of fluid overlod nd is being dmitted with  dignosis of cute excerbtion of distolic CHF.Mrlnd Kitchen  ED Course: Temperture is 98.4 blood pressure 155/101 pulse 89 respirtory of 26 oxygen sts 92% on room ir.  White count 7.4 hemoglobin 11.9 nd pltelets of 271.  Chemistry ppers to be norml except for CO2 of 39 chloride 93.  COVID-19 testing is negtive.  Chest x-ry shows smll bilterl pleurl effusions more on the right.  Crdiomegly nd mild pulmonry vsculr congestion.  Assessment & Pln:   Principl Problem:   Acute on chronic combined systolic nd distolic CHF (congestive hert filure) (HCC) Active Problems:   [place] hypertension   Type 2 dibetes mellitus (HCC)   PVD (peripherl vsculr disese) with cludiction ([place])   Crotid rtery obstruction   COPD GOLD  II   Cncer of left lung (HCC)  #1 cute on chronic distolic CHF excerbtion  Pulmonry Hypertension:  She estimtes her dry weight ~176 (weight on 07/21/2018 documented s 172 lbs) Currently on 2 L O2, which is wht she's been on since Mrch Diurese with 60 lsix IV BID Developing  bit of contrction lklosis? I/O, dily weights - weight down tody, net negtive, increse lsix  Dieticin for eduction Follow repet echo (EF >65%, mildly reduced systolic function, elevted RVSP) Consider crdiology c/s if difficulty with diuresis, but certinly needs outptient follow up Check UA to evl for proteinuri  # Cellulitis: ppers to hve some cellulitis of both legs - some erythem round excoritions, minly on L leg, but lso hd redness to bilterl feet.  keflex, pln for 5 dys.  Appers stble.  #2 COPD: No significnt wheezing.  No evidence of excerbtion of her COPD.  Continue monitoring. Continue noro ellipt, pulmicort, nd prn lbuterol  #3 hypertension: continue diltizem, lisinopril, metoprolol  #4 peripherl vsculr disese: Continue s, plvix, prvsttin  #5 dibetes: Sliding scle insulin with home regimen.  #6 history of left lung cncer: Not on tretment t this point.  # Anxiety  Insomni: vlium prn, trzodone t night  # GERD: continue PPI BID  # Hypoklemi: replce nd follow  DVT prophylxis: heprin Code Sttus: full  Fmily Communiction: dughter 7/31, will cll tody Disposition Pln: pending further improvement in volume overlod, requires further inptient diuresis  Consultnts:   none  Procedures: **Note De-Identified vi Obfusction** Echo IMPRESSIONS    1. The left ventricle hs hyperdynmic systolic function, with n ejection frction of >65%. The cvity size ws norml. There is moderte symmetric left ventriculr hypertrophy of the septl wll. Left ventriculr distolic Doppler prmeters re  indeterminte.  2. The right ventricle hs mildly reduced  systolic function. The cvity ws severely enlrged. There is no increse in right ventriculr wll thickness. Right ventriculr systolic pressure is severely elevted with n estimted pressure of 65.8 mmHg.  3. Smll pericrdil effusion.  4. No stenosis of the ortic vlve.  5. The ort is norml in size nd structure.  6. The ortic root is norml in size nd structure.  7. The inferior ven cv ws dilted in size with >50% respirtory vribility.  Antimicrobils: Anti-infectives (From dmission, onwrd)   Strt     Dose/Rte Route Frequency Ordered Stop   11/14/18 1200  cephALEXin (KEFLEX) cpsule 500 mg     500 mg Orl Every 6 hours 11/14/18 1116 11/19/18 1159     Subjective: Asking bout when d/c will be Feels better Legs still swollen  Objective: Vitls:   11/16/18 0500 11/16/18 0550 11/16/18 0803 11/16/18 0804  BP:  (!) 142/80    Pulse:  84    Resp:  16    Temp:  98.4 F (36.9 C)    TempSrc:  Orl    SpO2:  91% 93% 93%  Weight: 81.9 kg     Height:        Intke/Output Summry (Lst 24 hours) t 11/16/2018 1342 Lst dt filed t 11/16/2018 1048 Gross per 24 hour  Intke -  Output 1050 ml  Net -1050 ml   Filed Weights   11/13/18 2225 11/15/18 0500 11/16/18 0500  Weight: 84.8 kg 84.7 kg 81.9 kg    Exmintion: Generl: No cute distress. Crdiovsculr: Hert sounds show  regulr rte, nd rhythm.  Lungs: Cler to usculttion bilterlly  Abdomen: Soft, nontender, nondistended Neurologicl: Alert nd oriented 3. Moves ll extremities 4. Crnil nerves II through XII grossly intct. Skin: Wrm nd dry. No rshes or lesions. Extremities: bilterl LE edem, stble 1-2+     Dt Reviewed: I hve personlly reviewed following lbs nd imging studies  CBC: Recent Lbs  Lb 11/13/18 1434 11/13/18 2300 11/14/18 0431 11/15/18 0610 11/16/18 0545  WBC 7.4 7.6 7.6 7.3 8.3  NEUTROABS 6.0  --  6.0  --   --   HGB 11.9* 12.1 12.1 11.4* 12.3  HCT 38.6  42.1 41.7 40.5 42.6  MCV 85.3 90.0 90.1 91.8 91.6  PLT 271.0 266 239 254 517   Bsic Metbolic Pnel: Recent Lbs  Lb 11/13/18 1434 11/13/18 2300 11/14/18 0431 11/15/18 0610 11/16/18 0545  NA 141  --  142 139 140  K 3.6  --  3.2* 3.6 3.8  CL 93*  --  90* 91* 89*  CO2 39*  --  37* 38* 39*  GLUCOSE 145*  --  181* 154* 126*  BUN 19  --  _0 CREATININE 0.91 0.74 0.76 0.76 0.85  CALCIUM 9.3  --  9.1 8.8* 9.3  MG  --   --   --  1.7 2.0   GFR: Estimted Cretinine Clernce: 56.5 mL/min (by [employer] formul bsed on SCr of 0.85 mg/dL). Liver Function Tests: Recent Lbs  Lb 11/15/18 0610 11/16/18 0545  AST 20 20  ALT 14 13  ALKPHOS 42 45  BILITOT 0.5 0.5  PROT 6.4* 7.0  ALBUMIN 3.6 3.9 **Note De-Identified vi Obfusction** No results for input(s): LIPSE, MYLSE in the lst 168 hours. No results for input(s): MMONI in the lst 168 hours. Cogultion Profile: No results for input(s): INR, PROTIME in the lst 168 hours. Crdic Enzymes: No results for input(s): CKTOTL, CKMB, CKMBINDEX, TROPONINI in the lst 168 hours. BNP (lst 3 results) Recent Lbs    11/13/18 1434  PROBNP 1,147.0*   Hb1C: No results for input(s): HGB1C in the lst 72 hours. CBG: Recent Lbs  Lb 11/15/18 1209 11/15/18 1638 11/15/18 2247 11/16/18 0804 11/16/18 1217  GLUCP 183* 138* 130* 191* 126*   Lipid Profile: No results for input(s): CHOL, HDL, LDLCLC, TRIG, CHOLHDL, LDLDIRECT in the lst 72 hours. Thyroid Function Tests: No results for input(s): TSH, T4TOTL, FREET4, T3FREE, THYROIDB in the lst 72 hours. nemi Pnel: No results for input(s): VITMINB12, FOLTE, FERRITIN, TIBC, IRON, RETICCTPCT in the lst 72 hours. Sepsis Lbs: No results for input(s): PROCLCITON, LTICCIDVEN in the lst 168 hours.  Recent Results (from the pst 240 hour(s))  Novel Coronvirus, N ([employer])     Sttus: None   Collection Time: 11/13/18 12:00 M  Result Vlue Ref Rnge Sttus   SRS-CoV-2, N Not Detected Not  Detected Finl    Comment: This test ws developed nd its performnce chrcteristics determined by [employer]. This test hs not been FD clered or pproved. This test hs been uthorized by FD under n Emergency Use uthoriztion (EU). This test is only uthorized for the durtion of time the declrtion tht circumstnces exist justifying the uthoriztion of the emergency use of in vitro dignostic tests for detection of SRS-CoV-2 virus nd/or dignosis of COVID-19 infection under section 564(b)(1) of the [place], 21 U.S.C. 812XNT-7(G)(0), unless the uthoriztion is terminted or revoked sooner. When dignostic testing is negtive, the possibility of  flse negtive result should be considered in the context of  ptient's recent exposures nd the presence of clinicl signs nd symptoms consistent with COVID-19. n individul without symptoms of COVID-19 nd who is not shedding SRS-CoV-2 virus would expect to hve  negtive (not detected) result in this ssy.   SRS Coronvirus 2 (CEPHEID - Performed in [facility]), Hosp Order     Sttus: None   Collection Time: 11/13/18  8:12 PM   Specimen: Nsophryngel Swb  Result Vlue Ref Rnge Sttus   SRS Coronvirus 2 NEGTIVE NEGTIVE Finl    Comment: (NOTE) If result is NEGTIVE SRS-CoV-2 trget nucleic cids re NOT DETECTED. The SRS-CoV-2 RN is generlly detectble in upper nd lower  respirtory specimens during the cute phse of infection. The lowest  concentrtion of SRS-CoV-2 virl copies this ssy cn detect is 250  copies / mL.  negtive result does not preclude SRS-CoV-2 infection  nd should not be used s the sole bsis for tretment or other  ptient mngement decisions.   negtive result my occur with  improper specimen collection / hndling, submission of specimen other  thn nsophryngel swb, presence of virl muttion(s) within the  res trgeted by this ssy, nd  indequte number of virl copies  (<250 copies / mL).  negtive result must be combined with clinicl  observtions, ptient history, nd epidemiologicl informtion. If result is POSITIVE SRS-CoV-2 trget nucleic cids re DETECTED. The SRS-CoV-2 RN is generlly detectble in upper nd lower  respirtory specimens dur ing the cute phse of infection.  Positive  results re indictive of ctive infection with SRS-CoV-2.  Clinicl  correltion with ptient history nd other dignostic informtion is  necessry to determine patient infection status.  Positive results do  not rule out bacterial infection or co-infection with other viruses. If result is PRESUMPTIVE POSTIVE SRS-CoV-2 nucleic acids MY BE PRESENT.    presumptive positive result was obtained on the submitted specimen  and confirmed on repeat testing.  While 2019 novel coronavirus  (SRS-CoV-2) nucleic acids may be present in the submitted sample  additional confirmatory testing may be necessary for epidemiological  and / or clinical management purposes  to differentiate between  SRS-CoV-2 and other Sarbecovirus currently known to infect humans.  If clinically indicated additional testing with an alternate test  methodology [phone]) is advised. The SRS-CoV-2 RN is generally  detectable in upper and lower respiratory sp ecimens during the acute  phase of infection. The expected result is Negative. Fact Sheet for Patients:  StrictlyIdeas.no Fact Sheet for Healthcare Providers: BankingDealers.co.za This test is not yet approved or cleared by the [place] FD and has been authorized for detection and/or diagnosis of SRS-CoV-2 by FD under an Emergency Use uthorization (EU).  This EU will remain in effect (meaning this test can be used) for the duration of the COVID-19 declaration under Section 564(b)(1) of the [place], 21 U.S.C. section 360bbb-3(b)(1), unless the  authorization is terminated or revoked sooner. Performed at [facility], [place] [address] [place], [place] [address]          Radiology Studies: No results found.      Scheduled Meds: . aspirin  81 mg Oral Daily  . bisacodyl  10 mg Rectal Once  . budesonide (PULMICORT) nebulizer solution  0.25 mg Nebulization BID  . calcium carbonate  1,250 mg Oral QC supper  . cephLEXin  500 mg Oral Q6H  . clopidogrel  75 mg Oral Daily  . diltiazem  180 mg Oral Daily  . furosemide  60 mg Intravenous BID  . heparin  5,000 Units Subcutaneous Q8H  . insulin aspart  0-5 Units Subcutaneous QHS  . insulin aspart  0-9 Units Subcutaneous TID WC  . insulin glargine  8 Units Subcutaneous QHS  . lisinopril  10 mg Oral Daily  . magnesium oxide  200 mg Oral QC supper  . metoprolol tartrate  50 mg Oral BID WC  . omega-3 acid ethyl esters  1 g Oral Daily  . pantoprazole  40 mg Oral BID  . polyethylene glycol  17 g Oral BID  . pravastatin  40 mg Oral QC supper  . sodium chloride flush  3 mL Intravenous Q12H  . traZODone  50 mg Oral QHS  . umeclidinium-vilanterol  1 puff Inhalation Daily   Continuous Infusions: . sodium chloride       LOS: 3 days    Time spent: over 30 min    Fayrene Helper, MD [facility] Pager MION  If 7PM-7M, please contact night-coverage [email or web address] Password [email or web address] 11/16/2018, 1:42 PM

## 2018-11-17 ENCOUNTER — Telehealth: Payer: Self-pay | Admitting: Primary Care

## 2018-11-17 LAB — COMPREHENSIVE METABOLIC PANEL
ALT: 15 U/L (ref 0–44)
AST: 34 U/L (ref 15–41)
Albumin: 3.6 g/dL (ref 3.5–5.0)
Alkaline Phosphatase: 44 U/L (ref 38–126)
Anion gap: 12 (ref 5–15)
BUN: 21 mg/dL (ref 8–23)
CO2: 37 mmol/L — ABNORMAL HIGH (ref 22–32)
Calcium: 9 mg/dL (ref 8.9–10.3)
Chloride: 89 mmol/L — ABNORMAL LOW (ref 98–111)
Creatinine, Ser: 0.9 mg/dL (ref 0.44–1.00)
GFR calc Af Amer: >60 mL/min (ref >60)
GFR calc non Af Amer: >60 mL/min (ref >60)
Glucose, Bld: 210 mg/dL — ABNORMAL HIGH (ref 70–99)
Potassium: 3.9 mmol/L (ref 3.5–5.1)
Sodium: 138 mmol/L (ref 135–145)
Total Bilirubin: 0.4 mg/dL (ref 0.3–1.2)
Total Protein: 6.5 g/dL (ref 6.5–8.1)

## 2018-11-17 LAB — CBC
HCT: 39.7 % (ref 36.0–46.0)
Hemoglobin: 11.1 g/dL — ABNORMAL LOW (ref 12.0–15.0)
MCH: 25.6 pg — ABNORMAL LOW (ref 26.0–34.0)
MCHC: 28 g/dL — ABNORMAL LOW (ref 30.0–36.0)
MCV: 91.7 fL (ref 80.0–100.0)
Platelets: 237 10*3/uL (ref 150–400)
RBC: 4.33 MIL/uL (ref 3.87–5.11)
RDW: 13.6 % (ref 11.5–15.5)
WBC: 5.7 10*3/uL (ref 4.0–10.5)
nRBC: 0 % (ref 0.0–0.2)

## 2018-11-17 LAB — GLUCOSE, CAPILLARY
Glucose-Capillary: 118 mg/dL — ABNORMAL HIGH (ref 70–99)
Glucose-Capillary: 222 mg/dL — ABNORMAL HIGH (ref 70–99)
Glucose-Capillary: 131 mg/dL — ABNORMAL HIGH (ref 70–99)
Glucose-Capillary: 233 mg/dL — ABNORMAL HIGH (ref 70–99)

## 2018-11-17 LAB — MAGNESIUM: Magnesium: 2.1 mg/dL (ref 1.7–2.4)

## 2018-11-17 NOTE — TOC Progression Note (Signed)
Transition of Care [facility]) - Progression Note    Patient Details  Name: [name] MRN: [id number] Date of Birth: Oct 01, 1940  Transition of [facility]) CM/SW Contact  Joaquin Courts, RN Phone Number: 11/17/2018, 12:36 PM  Clinical Narrative:    CM spoke with patient at bedside who asked that CM contact her niece [name] to discuss discharge planning.  CM spoke with [name] who states patient will return home with [facility] services at discharge, patient was active with [facility], additionally kim is looking into having some home care services hired to help pt at home.  CM spoke with [facility] rep Santiago Glad who confirms that the patient is active with them, set up to receive [facility] PT OT RN Aide.  Niece [name] made aware that patient will need portable O2 tank at dc for transport home, which niece will bring when she picks her up.     Expected Discharge Plan: [place] Barriers to Discharge: No Barriers Identified  Expected Discharge Plan and Services Expected Discharge Plan: [place] In-house Referral: Clinical Social Work Discharge Planning Services: CM Consult Post Acute Care Choice: [place] arrangements for the past 2 months: Single Family Home Expected Discharge Date: 11/17/18               DME Arranged: N/A DME Agency: NA       HH Arranged: RN, PT, OT, Nurse's Aide [place] Agency: [place] ([place]) Date HH Agency Contacted: 11/17/18 Time [place]: [id number] Representative spoke with at [place]: [place] ([place]) Interventions    Readmission Risk Interventions Readmission Risk Prevention Plan 11/17/2018  Transportation Screening Complete  PCP or Specialist Appt within 3-5 Days Not Complete  Not Complete comments not ready for dc  HRI or [place] Complete  Social Work Consult for [place] Planning/Counseling Complete  Palliative Care Screening Not Applicable  Medication Review Human resources officer) Patient Refused  Some recent data might be hidden

## 2018-11-17 NOTE — Care Management Important Message (Signed)
Important Message  Patient Details IM Letter given to Nancy Marus RN to present to the Patient Name: Caitlin Oliver MRN: [id number] Date of Birth: 1941-03-18   Medicare Important Message Given:  Yes     Kerin Salen 11/17/2018, 1:15 PM

## 2018-11-17 NOTE — Progress Notes (Signed)
PROGRESS NOTE    [name]  GYJ:[id number] DOB: 04/30/1940 DOA: 11/13/2018 PCP: [name], PA-C   Brief Narrative:  Caitlin Oliver is Caitlin Oliver 78 y.o. female with medical history significant of advanced COPD followed by pulmonology, diastolic dysfunction CHF with EF in March of this year showing 60%, anxiety disorder, peripheral vascular disease, diabetes, GERD, hyperlipidemia, hypertension who presented to the ER with progressive shortness of breath leg swelling and cough.  Patient had CVA in March of this year.  She also has had lung cancer as well as coronary artery disease.  All these have been under control.  She has been on 2 L of oxygen at home since March of this year.  Patient has noticed worsening shortness of breath in the last 2 weeks including oxygen dropping into the 70s saturation.  Also mainly exertional.  Some PND and orthopnea.  She was seen in the outpatient setting was on 20 mg of Lasix which was increased to 40 mg 2 days ago.  She had increased urination but still continues to have more shortness of breath and leg swelling.  She came to the ER where she was evaluated.  Patient has evidence of fluid overload and is being admitted with Caitlin Oliver diagnosis of acute exacerbation of diastolic CHF.[name]  ED Course: Temperature is 98.4 blood pressure 155/101 pulse 89 respiratory of 26 oxygen sats 92% on room air.  White count 7.4 hemoglobin 11.9 and platelets of 271.  Chemistry appears to be normal except for CO2 of 39 chloride 93.  COVID-19 testing is negative.  Chest x-ray shows small bilateral pleural effusions more on the right.  Cardiomegaly and mild pulmonary vascular congestion.  Assessment & Plan:   Principal Problem:   Acute on chronic combined systolic and diastolic CHF (congestive heart failure) (HCC) Active Problems:   Essential hypertension   Type 2 diabetes mellitus (HCC)   PVD (peripheral vascular disease) with claudication ([name])   Carotid artery obstruction   COPD GOLD  II   Cancer of left lung (HCC)  #1 acute on chronic diastolic CHF exacerbation  Pulmonary Hypertension:  She estimates her dry weight ~176 (weight on 07/21/2018 documented as 172 lbs) Currently on 2 L O2, which is what she's been on since March Diurese with 60 lasix IV BID Developing Konor Noren bit of contraction alkalosis? I/O, daily weights - weight about the same today today, net negative Dietician for education Follow repeat echo (EF >65%, mildly reduced systolic function, elevated RVSP) Consider cardiology c/s if difficulty with diuresis, but certainly needs outpatient follow up Check UA to eval for proteinuria (negative for protein)  # Cellulitis: appears to have some cellulitis of both legs - some erythema around excoriations, mainly on L leg, but also had redness to bilateral feet.  keflex, plan for 5 days.  Appears stable.  #2 COPD: No significant wheezing.  No evidence of exacerbation of her COPD.  Continue monitoring. Continue anoro ellipta, pulmicort, and prn albuterol  #3 hypertension: continue diltiazem, lisinopril, metoprolol  #4 peripheral vascular disease: Continue asa, plavix, pravastatin  #5 diabetes: Sliding scale insulin with home regimen.  #6 history of left lung cancer: Not on treatment at this point.  # Anxiety  Insomnia: valium prn, trazodone at night  # GERD: continue PPI BID  # Hypokalemia: replace and follow  DVT prophylaxis: heparin Code Status: full  Family Communication: daughter 8/2, will call today Disposition Plan: pending further improvement in volume overload, requires further inpatient diuresis  Consultants:   none  Procedures:  Echo IMPRESSIONS    1. The left ventricle has hyperdynamic systolic function, with an ejection fraction of >65%. The cavity size was normal. There is moderate asymmetric left ventricular hypertrophy of the septal wall. Left ventricular diastolic Doppler parameters are  indeterminate.  2. The right ventricle  has mildly reduced systolic function. The cavity was severely enlarged. There is no increase in right ventricular wall thickness. Right ventricular systolic pressure is severely elevated with an estimated pressure of 65.8 mmHg.  3. Small pericardial effusion.  4. No stenosis of the aortic valve.  5. The aorta is normal in size and structure.  6. The aortic root is normal in size and structure.  7. The inferior vena cava was dilated in size with >50% respiratory variability.  Antimicrobials: Anti-infectives (From admission, onward)   Start     Dose/Rate Route Frequency Ordered Stop   11/14/18 1200  cephALEXin (KEFLEX) capsule 500 mg     500 mg Oral Every 6 hours 11/14/18 1116 11/19/18 1159     Subjective: No complaints Legs still swollen  Objective: Vitals:   11/17/18 0249 11/17/18 0531 11/17/18 0804 11/17/18 1240  BP: 109/68   92/71  Pulse: 79   (!) 56  Resp: (!) 22   18  Temp: 97.8 F (36.6 C)   98 F (36.7 C)  TempSrc:    Oral  SpO2: 92%  93% (!) 72%  Weight:  82.5 kg    Height:        Intake/Output Summary (Last 24 hours) at 11/17/2018 1718 Last data filed at 11/17/2018 1050 Gross per 24 hour  Intake 480 ml  Output 1400 ml  Net -920 ml   Filed Weights   11/15/18 0500 11/16/18 0500 11/17/18 0531  Weight: 84.7 kg 81.9 kg 82.5 kg    Examination: General: No acute distress. Cardiovascular: Heart sounds show Nashla Althoff regular rate, and rhythm. Lungs: Clear to auscultation bilaterally  Abdomen: Soft, nontender, nondistended  Neurological: Alert and oriented 3. Moves all extremities 4. Cranial nerves II through XII grossly intact. Skin: Warm and dry. No rashes or lesions. Extremities: bilateral LE edema, excoriations with mild redness, relatively stable      Data Reviewed: I have personally reviewed following labs and imaging studies  CBC: Recent Labs  Lab 11/13/18 1434 11/13/18 2300 11/14/18 0431 11/15/18 0610 11/16/18 0545 11/17/18 0449  WBC 7.4 7.6 7.6 7.3  8.3 5.7  NEUTROABS 6.0  --  6.0  --   --   --   HGB 11.9* 12.1 12.1 11.4* 12.3 11.1*  HCT 38.6 42.1 41.7 40.5 42.6 39.7  MCV 85.3 90.0 90.1 91.8 91.6 91.7  PLT 271.0 266 239 254 275 101   Basic Metabolic Panel: Recent Labs  Lab 11/13/18 1434 11/13/18 2300 11/14/18 0431 11/15/18 0610 11/16/18 0545 11/17/18 0449  NA 141  --  142 139 140 138  K 3.6  --  3.2* 3.6 3.8 3.9  CL 93*  --  90* 91* 89* 89*  CO2 39*  --  37* 38* 39* 37*  GLUCOSE 145*  --  181* 154* 126* 210*  BUN 19  --  _0 CREATININE 0.91 0.74 0.76 0.76 0.85 0.90  CALCIUM 9.3  --  9.1 8.8* 9.3 9.0  MG  --   --   --  1.7 2.0 2.1   GFR: Estimated Creatinine Clearance: 53.5 mL/min (by C-G formula based on SCr of 0.9 mg/dL). Liver Function Tests: Recent Labs  Lab 11/15/18 [phone]  11/16/18 0545 11/17/18 0449  AST 20 20 34  ALT _0 ALKPHOS 42 45 44  BILITOT 0.5 0.5 0.4  PROT 6.4* 7.0 6.5  ALBUMIN 3.6 3.9 3.6   No results for input(s): LIPASE, AMYLASE in the last 168 hours. No results for input(s): AMMONIA in the last 168 hours. Coagulation Profile: No results for input(s): INR, PROTIME in the last 168 hours. Cardiac Enzymes: No results for input(s): CKTOTAL, CKMB, CKMBINDEX, TROPONINI in the last 168 hours. BNP (last 3 results) Recent Labs    11/13/18 1434  PROBNP 1,147.0*   HbA1C: No results for input(s): HGBA1C in the last 72 hours. CBG: Recent Labs  Lab 11/16/18 1650 11/16/18 2052 11/17/18 0757 11/17/18 1143 11/17/18 1705  GLUCAP 179* 122* 118* 222* 131*   Lipid Profile: No results for input(s): CHOL, HDL, LDLCALC, TRIG, CHOLHDL, LDLDIRECT in the last 72 hours. Thyroid Function Tests: No results for input(s): TSH, T4TOTAL, FREET4, T3FREE, THYROIDAB in the last 72 hours. Anemia Panel: No results for input(s): VITAMINB12, FOLATE, FERRITIN, TIBC, IRON, RETICCTPCT in the last 72 hours. Sepsis Labs: No results for input(s): PROCALCITON, LATICACIDVEN in the last 168 hours.  Recent  Results (from the past 240 hour(s))  Novel Coronavirus, NAA ([facility])     Status: None   Collection Time: 11/13/18 12:00 AM  Result Value Ref Range Status   SARS-CoV-2, NAA Not Detected Not Detected Final    Comment: This test was developed and its performance characteristics determined by [employer]. This test has not been FDA cleared or approved. This test has been authorized by FDA under an Emergency Use Authorization (EUA). This test is only authorized for the duration of time the declaration that circumstances exist justifying the authorization of the emergency use of in vitro diagnostic tests for detection of SARS-CoV-2 virus and/or diagnosis of COVID-19 infection under section 564(b)(1) of the Act, 21 U.S.C. 664QIH-4(V)(4), unless the authorization is terminated or revoked sooner. When diagnostic testing is negative, the possibility of Gicela Schwarting false negative result should be considered in the context of Caroleena Paolini patient's recent exposures and the presence of clinical signs and symptoms consistent with COVID-19. An individual without symptoms of COVID-19 and who is not shedding SARS-CoV-2 virus would expect to have Cherolyn Behrle negative (not detected) result in this assay.   SARS Coronavirus 2 (CEPHEID - Performed in [place] hospital lab), Hosp Order     Status: None   Collection Time: 11/13/18  8:12 PM   Specimen: Nasopharyngeal Swab  Result Value Ref Range Status   SARS Coronavirus 2 NEGATIVE NEGATIVE Final    Comment: (NOTE) If result is NEGATIVE SARS-CoV-2 target nucleic acids are NOT DETECTED. The SARS-CoV-2 RNA is generally detectable in upper and lower  respiratory specimens during the acute phase of infection. The lowest  concentration of SARS-CoV-2 viral copies this assay can detect is 250  copies / mL. Arvil Utz negative result does not preclude SARS-CoV-2 infection  and should not be used as the sole basis for treatment or other  patient management decisions.  Carmela Piechowski negative result may  occur with  improper specimen collection / handling, submission of specimen other  than nasopharyngeal swab, presence of viral mutation(s) within the  areas targeted by this assay, and inadequate number of viral copies  (<250 copies / mL). Ordean Fouts negative result must be combined with clinical  observations, patient history, and epidemiological information. If result is POSITIVE SARS-CoV-2 target nucleic acids are DETECTED. The SARS-CoV-2 RNA is generally detectable in upper and lower  respiratory specimens dur ing the acute phase of infection.  Positive  results are indicative of active infection with SARS-CoV-2.  Clinical  correlation with patient history and other diagnostic information is  necessary to determine patient infection status.  Positive results do  not rule out bacterial infection or co-infection with other viruses. If result is PRESUMPTIVE POSTIVE SARS-CoV-2 nucleic acids MAY BE PRESENT.   Christopher Glasscock presumptive positive result was obtained on the submitted specimen  and confirmed on repeat testing.  While 2019 novel coronavirus  (SARS-CoV-2) nucleic acids may be present in the submitted sample  additional confirmatory testing may be necessary for epidemiological  and / or clinical management purposes  to differentiate between  SARS-CoV-2 and other Sarbecovirus currently known to infect humans.  If clinically indicated additional testing with an alternate test  methodology [phone]) is advised. The SARS-CoV-2 RNA is generally  detectable in upper and lower respiratory sp ecimens during the acute  phase of infection. The expected result is Negative. Fact Sheet for Patients:  StrictlyIdeas.no Fact Sheet for Healthcare Providers: BankingDealers.co.za This test is not yet approved or cleared by the [place] FDA and has been authorized for detection and/or diagnosis of SARS-CoV-2 by FDA under an Emergency Use Authorization (EUA).  This  EUA will remain in effect (meaning this test can be used) for the duration of the COVID-19 declaration under Section 564(b)(1) of the Act, 21 U.S.C. section 360bbb-3(b)(1), unless the authorization is terminated or revoked sooner. Performed at [facility], [place] [address], [place], [place] [address]          Radiology Studies: No results found.      Scheduled Meds: . aspirin  81 mg Oral Daily  . bisacodyl  10 mg Rectal Once  . budesonide (PULMICORT) nebulizer solution  0.25 mg Nebulization BID  . calcium carbonate  1,250 mg Oral QAC supper  . cephALEXin  500 mg Oral Q6H  . clopidogrel  75 mg Oral Daily  . diltiazem  180 mg Oral Daily  . furosemide  60 mg Intravenous BID  . heparin  5,000 Units Subcutaneous Q8H  . insulin aspart  0-5 Units Subcutaneous QHS  . insulin aspart  0-9 Units Subcutaneous TID WC  . insulin glargine  8 Units Subcutaneous QHS  . lisinopril  10 mg Oral Daily  . magnesium oxide  200 mg Oral QAC supper  . metoprolol tartrate  50 mg Oral BID WC  . omega-3 acid ethyl esters  1 g Oral Daily  . pantoprazole  40 mg Oral BID  . polyethylene glycol  17 g Oral BID  . pravastatin  40 mg Oral QAC supper  . sodium chloride flush  3 mL Intravenous Q12H  . traZODone  50 mg Oral QHS  . umeclidinium-vilanterol  1 puff Inhalation Daily   Continuous Infusions: . sodium chloride       LOS: 4 days    Time spent: over 30 min    Fayrene Helper, MD [facility] Pager AMION  If 7PM-7AM, please contact night-coverage [email or web address] Password [email or web address] 11/17/2018, 5:18 PM

## 2018-11-17 NOTE — Telephone Encounter (Signed)
Pt is still currently admitted at the hospital.  Attempted to call Hope with [facility] but unable to reach. Left message for [facility] to return call.

## 2018-11-17 NOTE — Progress Notes (Signed)
Chart and office note reviewed in detail  > agree with a/p as outlined

## 2018-11-17 NOTE — Telephone Encounter (Signed)
HOPE RETURNING CALL.Caitlin Oliver

## 2018-11-17 NOTE — Telephone Encounter (Signed)
HO[PE RETURNING CALL AND CAN BE REACHED @ [phone].Caitlin Oliver

## 2018-11-17 NOTE — Telephone Encounter (Signed)
Left message for [facility] to call back.

## 2018-11-18 DIAGNOSIS — J449 Chronic obstructive pulmonary disease, unspecified: Secondary | ICD-10-CM | POA: Diagnosis not present

## 2018-11-18 DIAGNOSIS — R0902 Hypoxemia: Secondary | ICD-10-CM | POA: Diagnosis not present

## 2018-11-18 DIAGNOSIS — I5043 Acute on chronic combined systolic (congestive) and diastolic (congestive) heart failure: Secondary | ICD-10-CM | POA: Diagnosis not present

## 2018-11-18 DIAGNOSIS — C3492 Malignant neoplasm of unspecified part of left bronchus or lung: Secondary | ICD-10-CM | POA: Diagnosis not present

## 2018-11-18 LAB — COMPREHENSIVE METABOLIC PANEL
ALT: 19 U/L (ref 0–44)
AST: 34 U/L (ref 15–41)
Albumin: 3.9 g/dL (ref 3.5–5.0)
Alkaline Phosphatase: 50 U/L (ref 38–126)
Anion gap: 10 (ref 5–15)
BUN: 23 mg/dL (ref 8–23)
CO2: 40 mmol/L — ABNORMAL HIGH (ref 22–32)
Calcium: 9.3 mg/dL (ref 8.9–10.3)
Chloride: 89 mmol/L — ABNORMAL LOW (ref 98–111)
Creatinine, Ser: 0.87 mg/dL (ref 0.44–1.00)
GFR calc Af Amer: >60 mL/min (ref >60)
GFR calc non Af Amer: >60 mL/min (ref >60)
Glucose, Bld: 146 mg/dL — ABNORMAL HIGH (ref 70–99)
Potassium: 4.2 mmol/L (ref 3.5–5.1)
Sodium: 139 mmol/L (ref 135–145)
Total Bilirubin: 0.7 mg/dL (ref 0.3–1.2)
Total Protein: 7 g/dL (ref 6.5–8.1)

## 2018-11-18 LAB — CBC
HCT: 40.4 % (ref 36.0–46.0)
Hemoglobin: 11.8 g/dL — ABNORMAL LOW (ref 12.0–15.0)
MCH: 26.8 pg (ref 26.0–34.0)
MCHC: 29.2 g/dL — ABNORMAL LOW (ref 30.0–36.0)
MCV: 91.6 fL (ref 80.0–100.0)
Platelets: 235 10*3/uL (ref 150–400)
RBC: 4.41 MIL/uL (ref 3.87–5.11)
RDW: 13.5 % (ref 11.5–15.5)
WBC: 6.4 10*3/uL (ref 4.0–10.5)
nRBC: 0 % (ref 0.0–0.2)

## 2018-11-18 LAB — MAGNESIUM: Magnesium: 2.1 mg/dL (ref 1.7–2.4)

## 2018-11-18 LAB — GLUCOSE, CAPILLARY
Glucose-Capillary: 150 mg/dL — ABNORMAL HIGH (ref 70–99)
Glucose-Capillary: 201 mg/dL — ABNORMAL HIGH (ref 70–99)
Glucose-Capillary: 112 mg/dL — ABNORMAL HIGH (ref 70–99)
Glucose-Capillary: 216 mg/dL — ABNORMAL HIGH (ref 70–99)

## 2018-11-18 LAB — BRAIN NATRIURETIC PEPTIDE: B Natriuretic Peptide: 422.3 pg/mL — ABNORMAL HIGH (ref 0.0–100.0)

## 2018-11-18 MED ORDER — LACTATED RINGERS IV BOLUS
500.0000 mL | Freq: Once | INTRAVENOUS | Status: AC
  Administered 2018-11-18: 14:00:00 500 mL via INTRAVENOUS

## 2018-11-18 NOTE — Progress Notes (Signed)
Pt moved to bed from chair, Lactated ringers bolus infusing to left forearm peripheral IV. RRRN called to alert of pt needing a bed in step down and to update on pt s/s at the time.

## 2018-11-18 NOTE — Telephone Encounter (Signed)
LMTCB x2 for Hope with [facility].

## 2018-11-18 NOTE — Progress Notes (Signed)
Physical Therapy Treatment Patient Details Name: Caitlin Oliver MRN: [id number] DOB: [birth year] Today's Date: 11/18/2018    History of Present Illness Caitlin Oliver is a 78 y.o. female with medical history significant of advanced COPD followed by pulmonology, diastolic dysfunction CHF with EF in March of this year showing 60%, anxiety disorder, peripheral vascular disease, diabetes, GERD, hyperlipidemia, hypertension who presented to the ER with progressive shortness of breath leg swelling and cough.  Patient had CVA in March of this year.    PT Comments    Pt ambulated in hallway and required 2 seated rest breaks.  Pt requiring 3L O2 Caitlin Oliver for mobility at this time.  Pt reports not feeling as well today.   Follow Up Recommendations  SNF;Supervision for mobility/OOB     Equipment Recommendations  None recommended by PT    Recommendations for Other Services       Precautions / Restrictions Precautions Precautions: Fall Precaution Comments: monitor sats    Mobility  Bed Mobility               General bed mobility comments: Pt up in chair upon arrival  Transfers Overall transfer level: Needs assistance Equipment used: Rolling walker (2 wheeled) Transfers: Sit to/from Stand Sit to Stand: Min assist         General transfer comment: verbal cues for hand placement, assist to rise  Ambulation/Gait Ambulation/Gait assistance: Min assist Gait Distance (Feet): 90 Feet Assistive device: Rolling walker (2 wheeled) Gait Pattern/deviations: Decreased stride length;Trunk flexed     General Gait Details: pt required 2 seated rest breaks, SpO2 87% on 2L O2 [place], increased to 3L O2 for SPO2 90% and RN aware   Stairs             Wheelchair Mobility    Modified Rankin (Stroke Patients Only)       Balance                                            Cognition Arousal/Alertness: Awake/alert Behavior During Therapy: WFL for tasks  assessed/performed Overall Cognitive Status: Within Functional Limits for tasks assessed                                        Exercises      General Comments        Pertinent Vitals/Pain Pain Location: no specifics complaints Pain Intervention(s): RN gave pain meds during session    Home Living                      Prior Function            PT Goals (current goals can now be found in the care plan section) Progress towards PT goals: Progressing toward goals    Frequency    Min 3X/week      PT Plan Current plan remains appropriate    Co-evaluation              AM-PAC PT "6 Clicks" Mobility   Outcome Measure  Help needed turning from your back to your side while in a flat bed without using bedrails?: A Lot Help needed moving from lying on your back to sitting on the side of a flat bed without using bedrails?: A Lot  Help needed moving to and from a bed to a chair (including a wheelchair)?: A Little Help needed standing up from a chair using your arms (e.g., wheelchair or bedside chair)?: A Little Help needed to walk in hospital room?: A Little Help needed climbing 3-5 steps with a railing? : A Lot 6 Click Score: 15    End of Session Equipment Utilized During Treatment: Gait belt Activity Tolerance: Patient tolerated treatment well Patient left: in chair;with call bell/phone within reach;with nursing/sitter in room Nurse Communication: Mobility status PT Visit Diagnosis: Unsteadiness on feet (R26.81);Muscle weakness (generalized) (M62.81);Difficulty in walking, not elsewhere classified (R26.2)     Time: 3419-3790 PT Time Calculation (min) (ACUTE ONLY): 20 min  Charges:  $Gait Training: 8-22 mins                     Carmelia Bake, PT, DPT Acute Rehabilitation Services Office: [phone] Pager: [phone]  Trena Platt 11/18/2018, 4:40 PM

## 2018-11-18 NOTE — Telephone Encounter (Signed)
pharmacy [phone] Hope returning your call she needs to know how many boxes of albuterol (25 in each box) does the pt need for 90 day supply

## 2018-11-18 NOTE — Progress Notes (Addendum)
PROGRESS NOTE    Caitlin Oliver  QHU:[id number] DOB: Sep 05, 1940 DOA: 11/13/2018 PCP: [name], PA-C   Brief Narrative:  Caitlin Oliver is Caitlin Oliver 78 y.o. female with medical history significant of advanced COPD followed by pulmonology, diastolic dysfunction CHF with EF in March of this year showing 60%, anxiety disorder, peripheral vascular disease, diabetes, GERD, hyperlipidemia, hypertension who presented to the ER with progressive shortness of breath leg swelling and cough.  Patient had CVA in March of this year.  She also has had lung cancer as well as coronary artery disease.  All these have been under control.  She has been on 2 L of oxygen at home since March of this year.  Patient has noticed worsening shortness of breath in the last 2 weeks including oxygen dropping into the 70s saturation.  Also mainly exertional.  Some PND and orthopnea.  She was seen in the outpatient setting was on 20 mg of Lasix which was increased to 40 mg 2 days ago.  She had increased urination but still continues to have more shortness of breath and leg swelling.  She came to the ER where she was evaluated.  Patient has evidence of fluid overload and is being admitted with Caitlin Oliver diagnosis of acute exacerbation of diastolic CHF.[name]  ED Course: Temperature is 98.4 blood pressure 155/101 pulse 89 respiratory of 26 oxygen sats 92% on room air.  White count 7.4 hemoglobin 11.9 and platelets of 271.  Chemistry appears to be normal except for CO2 of 39 chloride 93.  COVID-19 testing is negative.  Chest x-ray shows small bilateral pleural effusions more on the right.  Cardiomegaly and mild pulmonary vascular congestion.  Assessment & Plan:   Principal Problem:   Acute on chronic combined systolic and diastolic CHF (congestive heart failure) (HCC) Active Problems:   Essential hypertension   Type 2 diabetes mellitus (HCC)   PVD (peripheral vascular disease) with claudication ([place])   Carotid artery obstruction   COPD GOLD  II   Cancer of left lung ([place])  # Hypotension: Possibly overdiuresed? Though she still has LE edema and is over 07/21/2018 weight.  Bolus 500 cc.  Hold lisinopril and lasix, place holding parameters for metoprolol and diltiazem.  Transfer to stepdown for closer monitoring given BP's in 46'F and 68'L systolic. EKG was abnormal, notable for sinus rhythm with t wave inversion in III, aVF, V1-V3, but she denied any CP or SOB, noting only lightheadedness when I evaluated her.  Will ctm and repeat EKG in AM.  #1 acute on chronic diastolic CHF exacerbation  Pulmonary Hypertension:  She estimates her dry weight ~176 (weight on 07/21/2018 documented as 172 lbs) Currently on 2 L O2, which is what she's been on since March Diurese with 60 lasix IV BID - on hold given above Developing Misty Foutz bit of contraction alkalosis? I/O, daily weights - weight down to 176 today, net negative Dietician for education Follow repeat echo (EF >65%, mildly reduced systolic function, elevated RVSP) Check UA to eval for proteinuria (negative for protein) Will consult cardiology for assistance - pt will need outpatient f/u with heart failure service due to pulm hypertension  # Cellulitis: appears to have some cellulitis of both legs - some erythema around excoriations, mainly on L leg, but also had redness to bilateral feet.  keflex, plan for 5 days.  Appears stable.  #2 COPD: No significant wheezing.  No evidence of exacerbation of her COPD.  Continue monitoring. Continue anoro ellipta, pulmicort, and prn  albuterol  #3 hypertension: continue diltiazem, lisinopril, metoprolol  #4 peripheral vascular disease: Continue asa, plavix, pravastatin  #5 diabetes: Sliding scale insulin with home regimen.  #6 history of left lung cancer: Not on treatment at this point.  # Anxiety  Insomnia: valium prn, trazodone at night  # GERD: continue PPI BID  # Hypokalemia: replace and follow  DVT prophylaxis: heparin Code Status:  full  Family Communication: daughter 8/4 Disposition Plan: pending further stabilization.  Awaiting cardiology c/s.  Consultants:   none  Procedures:  Echo IMPRESSIONS    1. The left ventricle has hyperdynamic systolic function, with an ejection fraction of >65%. The cavity size was normal. There is moderate asymmetric left ventricular hypertrophy of the septal wall. Left ventricular diastolic Doppler parameters are  indeterminate.  2. The right ventricle has mildly reduced systolic function. The cavity was severely enlarged. There is no increase in right ventricular wall thickness. Right ventricular systolic pressure is severely elevated with an estimated pressure of 65.8 mmHg.  3. Small pericardial effusion.  4. No stenosis of the aortic valve.  5. The aorta is normal in size and structure.  6. The aortic root is normal in size and structure.  7. The inferior vena cava was dilated in size with >50% respiratory variability.  Antimicrobials: Anti-infectives (From admission, onward)   Start     Dose/Rate Route Frequency Ordered Stop   11/14/18 1200  cephALEXin (KEFLEX) capsule 500 mg     500 mg Oral Every 6 hours 11/14/18 1116 11/19/18 1159     Subjective: Feels lightheaded.    Objective: Vitals:   11/18/18 0837 11/18/18 1200 11/18/18 1233 11/18/18 1340  BP:   (!) 80/58 (!) 75/43  Pulse:   98   Resp:   20   Temp:   97.8 F (36.6 C)   TempSrc:   Oral   SpO2: 95%  95%   Weight:  80.2 kg    Height:        Intake/Output Summary (Last 24 hours) at 11/18/2018 1516 Last data filed at 11/18/2018 1338 Gross per 24 hour  Intake 360 ml  Output 1850 ml  Net -1490 ml   Filed Weights   11/16/18 0500 11/17/18 0531 11/18/18 1200  Weight: 81.9 kg 82.5 kg 80.2 kg    Examination: General: No acute distress. Cardiovascular: Heart sounds show Caitlin Oliver regular rate, and rhythm.  Lungs: Clear to auscultation bilaterally  Abdomen: Soft, nontender, nondistended  Neurological: Alert and  oriented 3. Moves all extremities 4. Cranial nerves II through XII grossly intact. Skin: Warm and dry. No rashes or lesions. Extremities: bilateral LE edema, 1+     Data Reviewed: I have personally reviewed following labs and imaging studies  CBC: Recent Labs  Lab 11/13/18 1434  11/14/18 0431 11/15/18 0610 11/16/18 0545 11/17/18 0449 11/18/18 0431  WBC 7.4   < > 7.6 7.3 8.3 5.7 6.4  NEUTROABS 6.0  --  6.0  --   --   --   --   HGB 11.9*   < > 12.1 11.4* 12.3 11.1* 11.8*  HCT 38.6   < > 41.7 40.5 42.6 39.7 40.4  MCV 85.3   < > 90.1 91.8 91.6 91.7 91.6  PLT 271.0   < > 239 254 275 237 235   < > = values in this interval not displayed.   Basic Metabolic Panel: Recent Labs  Lab 11/14/18 0431 11/15/18 0610 11/16/18 0545 11/17/18 0449 11/18/18 0431  NA 142 139 140 138 139  K 3.2* 3.6 3.8 3.9 4.2  CL 90* 91* 89* 89* 89*  CO2 37* 38* 39* 37* 40*  GLUCOSE 181* 154* 126* 210* 146*  BUN _0 CREATININE 0.76 0.76 0.85 0.90 0.87  CALCIUM 9.1 8.8* 9.3 9.0 9.3  MG  --  1.7 2.0 2.1 2.1   GFR: Estimated Creatinine Clearance: 54.6 mL/min (by C-G formula based on SCr of 0.87 mg/dL). Liver Function Tests: Recent Labs  Lab 11/15/18 0610 11/16/18 0545 11/17/18 0449 11/18/18 0431  AST 20 20 34 34  ALT _1 ALKPHOS 42 45 44 50  BILITOT 0.5 0.5 0.4 0.7  PROT 6.4* 7.0 6.5 7.0  ALBUMIN 3.6 3.9 3.6 3.9   No results for input(s): LIPASE, AMYLASE in the last 168 hours. No results for input(s): AMMONIA in the last 168 hours. Coagulation Profile: No results for input(s): INR, PROTIME in the last 168 hours. Cardiac Enzymes: No results for input(s): CKTOTAL, CKMB, CKMBINDEX, TROPONINI in the last 168 hours. BNP (last 3 results) Recent Labs    11/13/18 1434  PROBNP 1,147.0*   HbA1C: No results for input(s): HGBA1C in the last 72 hours. CBG: Recent Labs  Lab 11/17/18 1143 11/17/18 1705 11/17/18 2234 11/18/18 0742 11/18/18 1226  GLUCAP 222* 131* 233*  150* 201*   Lipid Profile: No results for input(s): CHOL, HDL, LDLCALC, TRIG, CHOLHDL, LDLDIRECT in the last 72 hours. Thyroid Function Tests: No results for input(s): TSH, T4TOTAL, FREET4, T3FREE, THYROIDAB in the last 72 hours. Anemia Panel: No results for input(s): VITAMINB12, FOLATE, FERRITIN, TIBC, IRON, RETICCTPCT in the last 72 hours. Sepsis Labs: No results for input(s): PROCALCITON, LATICACIDVEN in the last 168 hours.  Recent Results (from the past 240 hour(s))  Novel Coronavirus, NAA ([facility])     Status: None   Collection Time: 11/13/18 12:00 AM  Result Value Ref Range Status   SARS-CoV-2, NAA Not Detected Not Detected Final    Comment: This test was developed and its performance characteristics determined by [employer]. This test has not been FDA cleared or approved. This test has been authorized by FDA under an Emergency Use Authorization (EUA). This test is only authorized for the duration of time the declaration that circumstances exist justifying the authorization of the emergency use of in vitro diagnostic tests for detection of SARS-CoV-2 virus and/or diagnosis of COVID-19 infection under section 564(b)(1) of the Act, 21 U.S.C. 009QZR-0(Q)(7), unless the authorization is terminated or revoked sooner. When diagnostic testing is negative, the possibility of Caitlin Oliver false negative result should be considered in the context of Caitlin Oliver patient's recent exposures and the presence of clinical signs and symptoms consistent with COVID-19. An individual without symptoms of COVID-19 and who is not shedding SARS-CoV-2 virus would expect to have Caitlin Oliver negative (not detected) result in this assay.   SARS Coronavirus 2 (CEPHEID - Performed in [place] hospital lab), Hosp Order     Status: None   Collection Time: 11/13/18  8:12 PM   Specimen: Nasopharyngeal Swab  Result Value Ref Range Status   SARS Coronavirus 2 NEGATIVE NEGATIVE Final    Comment: (NOTE) If result is NEGATIVE  SARS-CoV-2 target nucleic acids are NOT DETECTED. The SARS-CoV-2 RNA is generally detectable in upper and lower  respiratory specimens during the acute phase of infection. The lowest  concentration of SARS-CoV-2 viral copies this assay can detect is 250  copies / mL. Caitlin Oliver negative result does not preclude SARS-CoV-2 infection  and should not be used  as the sole basis for treatment or other  patient management decisions.  Caitlin Oliver negative result may occur with  improper specimen collection / handling, submission of specimen other  than nasopharyngeal swab, presence of viral mutation(s) within the  areas targeted by this assay, and inadequate number of viral copies  (<250 copies / mL). Caitlin Oliver negative result must be combined with clinical  observations, patient history, and epidemiological information. If result is POSITIVE SARS-CoV-2 target nucleic acids are DETECTED. The SARS-CoV-2 RNA is generally detectable in upper and lower  respiratory specimens dur ing the acute phase of infection.  Positive  results are indicative of active infection with SARS-CoV-2.  Clinical  correlation with patient history and other diagnostic information is  necessary to determine patient infection status.  Positive results do  not rule out bacterial infection or co-infection with other viruses. If result is PRESUMPTIVE POSTIVE SARS-CoV-2 nucleic acids MAY BE PRESENT.   Caitlin Oliver presumptive positive result was obtained on the submitted specimen  and confirmed on repeat testing.  While 2019 novel coronavirus  (SARS-CoV-2) nucleic acids may be present in the submitted sample  additional confirmatory testing may be necessary for epidemiological  and / or clinical management purposes  to differentiate between  SARS-CoV-2 and other Sarbecovirus currently known to infect humans.  If clinically indicated additional testing with an alternate test  methodology [phone]) is advised. The SARS-CoV-2 RNA is generally  detectable in upper  and lower respiratory sp ecimens during the acute  phase of infection. The expected result is Negative. Fact Sheet for Patients:  StrictlyIdeas.no Fact Sheet for Healthcare Providers: BankingDealers.co.za This test is not yet approved or cleared by the [place] FDA and has been authorized for detection and/or diagnosis of SARS-CoV-2 by FDA under an Emergency Use Authorization (EUA).  This EUA will remain in effect (meaning this test can be used) for the duration of the COVID-19 declaration under Section 564(b)(1) of the Act, 21 U.S.C. section 360bbb-3(b)(1), unless the authorization is terminated or revoked sooner. Performed at [facility], [place] [address], [place], [place] [address]          Radiology Studies: No results found.      Scheduled Meds: . aspirin  81 mg Oral Daily  . bisacodyl  10 mg Rectal Once  . budesonide (PULMICORT) nebulizer solution  0.25 mg Nebulization BID  . calcium carbonate  1,250 mg Oral QAC supper  . cephALEXin  500 mg Oral Q6H  . clopidogrel  75 mg Oral Daily  . diltiazem  180 mg Oral Daily  . heparin  5,000 Units Subcutaneous Q8H  . insulin aspart  0-5 Units Subcutaneous QHS  . insulin aspart  0-9 Units Subcutaneous TID WC  . insulin glargine  8 Units Subcutaneous QHS  . magnesium oxide  200 mg Oral QAC supper  . metoprolol tartrate  50 mg Oral BID WC  . omega-3 acid ethyl esters  1 g Oral Daily  . pantoprazole  40 mg Oral BID  . polyethylene glycol  17 g Oral BID  . pravastatin  40 mg Oral QAC supper  . sodium chloride flush  3 mL Intravenous Q12H  . traZODone  50 mg Oral QHS  . umeclidinium-vilanterol  1 puff Inhalation Daily   Continuous Infusions: . sodium chloride       LOS: 5 days    Time spent: over 30 min    Fayrene Helper, MD [facility] Pager AMION  If 7PM-7AM, please contact night-coverage [email or web address] Password [email or web address]  11/18/2018, 3:16  PM

## 2018-11-18 NOTE — Plan of Care (Signed)
  Problem: Health Behavior/Discharge Planning: Goal: Ability to manage health-related needs will improve Outcome: Progressing   Problem: Education: Goal: Ability to demonstrate management of disease process will improve Outcome: Progressing   Problem: Education: Goal: Ability to verbalize understanding of medication therapies will improve Outcome: Progressing

## 2018-11-18 NOTE — Telephone Encounter (Signed)
Called [facility] back to clarify is the box 25 vials. The order reads 52ms every 6 hours if needed. If patient needs 312m4x a day for 90 days the patient would need 108066m If each vial is only 3ml49md we are counting vials then the patient would need 360 vials for 90 days whichever is needed to get the patient their medication.  I left a message with Hope asking if a box of 25 meant 25 ,3ml 39mls ?

## 2018-11-19 ENCOUNTER — Inpatient Hospital Stay (HOSPITAL_COMMUNITY): Payer: PPO

## 2018-11-19 ENCOUNTER — Other Ambulatory Visit: Payer: Self-pay

## 2018-11-19 ENCOUNTER — Encounter (HOSPITAL_COMMUNITY): Payer: Self-pay | Admitting: Cardiology

## 2018-11-19 DIAGNOSIS — I5043 Acute on chronic combined systolic (congestive) and diastolic (congestive) heart failure: Secondary | ICD-10-CM

## 2018-11-19 DIAGNOSIS — I272 Pulmonary hypertension, unspecified: Secondary | ICD-10-CM

## 2018-11-19 LAB — COMPREHENSIVE METABOLIC PANEL
ALT: 26 U/L (ref 0–44)
AST: 44 U/L — ABNORMAL HIGH (ref 15–41)
Albumin: 3.5 g/dL (ref 3.5–5.0)
Alkaline Phosphatase: 47 U/L (ref 38–126)
Anion gap: 6 (ref 5–15)
BUN: 20 mg/dL (ref 8–23)
CO2: 42 mmol/L — ABNORMAL HIGH (ref 22–32)
Calcium: 9.1 mg/dL (ref 8.9–10.3)
Chloride: 91 mmol/L — ABNORMAL LOW (ref 98–111)
Creatinine, Ser: 0.75 mg/dL (ref 0.44–1.00)
GFR calc Af Amer: >60 mL/min (ref >60)
GFR calc non Af Amer: >60 mL/min (ref >60)
Glucose, Bld: 141 mg/dL — ABNORMAL HIGH (ref 70–99)
Potassium: 4.2 mmol/L (ref 3.5–5.1)
Sodium: 139 mmol/L (ref 135–145)
Total Bilirubin: 0.5 mg/dL (ref 0.3–1.2)
Total Protein: 6.3 g/dL — ABNORMAL LOW (ref 6.5–8.1)

## 2018-11-19 LAB — D-DIMER, QUANTITATIVE: D-Dimer, Quant: 0.64 ug/mL-FEU — ABNORMAL HIGH (ref 0.00–0.50)

## 2018-11-19 LAB — GLUCOSE, CAPILLARY
Glucose-Capillary: 121 mg/dL — ABNORMAL HIGH (ref 70–99)
Glucose-Capillary: 182 mg/dL — ABNORMAL HIGH (ref 70–99)
Glucose-Capillary: 166 mg/dL — ABNORMAL HIGH (ref 70–99)
Glucose-Capillary: 146 mg/dL — ABNORMAL HIGH (ref 70–99)

## 2018-11-19 LAB — CBC
HCT: 39.2 % (ref 36.0–46.0)
Hemoglobin: 11.2 g/dL — ABNORMAL LOW (ref 12.0–15.0)
MCH: 26.3 pg (ref 26.0–34.0)
MCHC: 28.6 g/dL — ABNORMAL LOW (ref 30.0–36.0)
MCV: 92 fL (ref 80.0–100.0)
Platelets: 223 10*3/uL (ref 150–400)
RBC: 4.26 MIL/uL (ref 3.87–5.11)
RDW: 13.6 % (ref 11.5–15.5)
WBC: 6.4 10*3/uL (ref 4.0–10.5)
nRBC: 0 % (ref 0.0–0.2)

## 2018-11-19 LAB — TROPONIN I (HIGH SENSITIVITY)
Troponin I (High Sensitivity): 72 ng/L — ABNORMAL HIGH (ref <18)
Troponin I (High Sensitivity): 75 ng/L — ABNORMAL HIGH (ref <18)

## 2018-11-19 LAB — MAGNESIUM: Magnesium: 2.2 mg/dL (ref 1.7–2.4)

## 2018-11-19 MED ORDER — ALBUMIN HUMAN 25 % IV SOLN
50.0000 g | Freq: Once | INTRAVENOUS | Status: AC
  Administered 2018-11-19: 12.5 g via INTRAVENOUS
  Filled 2018-11-19: qty 200

## 2018-11-19 MED ORDER — DILTIAZEM HCL ER COATED BEADS 180 MG PO CP24: 180.0000 mg | ORAL_CAPSULE | Freq: Every day | ORAL | Status: DC

## 2018-11-19 MED ORDER — DILTIAZEM HCL ER COATED BEADS 120 MG PO CP24
120.0000 mg | ORAL_CAPSULE | Freq: Every day | ORAL | Status: DC
  Filled 2018-11-19: qty 1

## 2018-11-19 MED ORDER — LEVALBUTEROL HCL 0.63 MG/3ML IN NEBU
0.6300 mg | INHALATION_SOLUTION | Freq: Three times a day (TID) | RESPIRATORY_TRACT | Status: DC
  Administered 2018-11-19 (×2): 0.63 mg via RESPIRATORY_TRACT
  Filled 2018-11-19 (×2): qty 3

## 2018-11-19 MED ORDER — METOPROLOL TARTRATE 5 MG/5ML IV SOLN
2.5000 mg | Freq: Once | INTRAVENOUS | Status: AC
  Administered 2018-11-19: 2.5 mg via INTRAVENOUS
  Filled 2018-11-19: qty 5

## 2018-11-19 MED ORDER — LEVALBUTEROL HCL 0.63 MG/3ML IN NEBU
0.6300 mg | INHALATION_SOLUTION | Freq: Three times a day (TID) | RESPIRATORY_TRACT | Status: DC
  Administered 2018-11-20 – 2018-11-24 (×13): 0.63 mg via RESPIRATORY_TRACT
  Filled 2018-11-19 (×15): qty 3

## 2018-11-19 MED ORDER — DILTIAZEM HCL 30 MG PO TABS
30.0000 mg | ORAL_TABLET | Freq: Once | ORAL | Status: AC
  Administered 2018-11-19: 30 mg via ORAL
  Filled 2018-11-19: qty 1

## 2018-11-19 MED ORDER — PHENYLEPHRINE HCL-NACL 10-0.9 MG/250ML-% IV SOLN: 0.0000 ug/min | INTRAVENOUS | Status: DC

## 2018-11-19 MED ORDER — CHLORHEXIDINE GLUCONATE CLOTH 2 % EX PADS
6.0000 | MEDICATED_PAD | Freq: Every day | CUTANEOUS | Status: DC
  Administered 2018-11-19 – 2018-11-24 (×5): 6 via TOPICAL

## 2018-11-19 MED ORDER — FUROSEMIDE 10 MG/ML IJ SOLN
40.0000 mg | Freq: Every day | INTRAMUSCULAR | Status: DC
  Administered 2018-11-19 – 2018-11-20 (×2): 40 mg via INTRAVENOUS
  Filled 2018-11-19 (×2): qty 4

## 2018-11-19 MED ORDER — METOPROLOL TARTRATE 25 MG PO TABS
25.0000 mg | ORAL_TABLET | Freq: Two times a day (BID) | ORAL | Status: DC
  Administered 2018-11-20 – 2018-11-24 (×9): 25 mg via ORAL
  Filled 2018-11-19 (×10): qty 1

## 2018-11-19 MED ORDER — METOPROLOL TARTRATE 25 MG PO TABS: 50.0000 mg | ORAL_TABLET | Freq: Two times a day (BID) | ORAL | Status: DC

## 2018-11-19 MED ORDER — SODIUM CHLORIDE 0.9 % IV BOLUS
250.0000 mL | Freq: Once | INTRAVENOUS | Status: AC
  Administered 2018-11-19: 250 mL via INTRAVENOUS

## 2018-11-19 MED ORDER — LEVALBUTEROL TARTRATE 45 MCG/ACT IN AERO: 2.0000 | INHALATION_SPRAY | Freq: Three times a day (TID) | RESPIRATORY_TRACT | Status: DC

## 2018-11-19 MED ORDER — ALBUTEROL SULFATE (2.5 MG/3ML) 0.083% IN NEBU: 2.5000 mg | INHALATION_SOLUTION | Freq: Four times a day (QID) | RESPIRATORY_TRACT | 1 refills | Status: DC | PRN

## 2018-11-19 MED ORDER — ALBUMIN HUMAN 25 % IV SOLN
INTRAVENOUS | Status: AC
  Administered 2018-11-19: 12.5 g via INTRAVENOUS
  Filled 2018-11-19: qty 200

## 2018-11-19 MED ORDER — ORAL CARE MOUTH RINSE
15.0000 mL | Freq: Two times a day (BID) | OROMUCOSAL | Status: DC
  Administered 2018-11-19 – 2018-11-24 (×10): 15 mL via OROMUCOSAL

## 2018-11-19 MED ORDER — ALBUMIN HUMAN 25 % IV SOLN
50.0000 g | Freq: Once | INTRAVENOUS | Status: AC
  Administered 2018-11-19 (×4): 12.5 g via INTRAVENOUS
  Filled 2018-11-19 (×2): qty 50

## 2018-11-19 NOTE — Consult Note (Addendum)
Cardiology Consultation:   Patient ID: Caitlin Oliver MRN: [id number]; DOB: 17-Dec-1940  Admit date: 11/13/2018 Date of Consult: 11/19/2018  Primary Care Provider: [name], PA-C Primary Cardiologist: Quay Burow, MD  Primary Electrophysiologist:  None    Patient Profile:   Caitlin Oliver is a 78 y.o. female with a hx of significant PVD including high grade bilateral carotid artery disease s/p staged right followed by left carotid endarterectomy performed by Dr.Brabham in 2014, left subclavian stenting in 2017, LE arterial disease s/p bilateral common iliac rotational atherectomy PTA and stenting in 2015, HTN, HLD, DM, tobacco abuse, COPD, lung cancer (stage IA squamous cell carcinoma) s/p left upper lobectomy in 2017, chronic home O2 (2L/min baseline), CVA, h/o GIB w/ Plavix and HFpEF, who is being seen today for the evaluation of acute CHF at the request of Dr. Nevada Crane, Internal Medicine.   History of Present Illness:   Pt primary followed by Dr. Gwenlyn Found for PVD. Also followed by [facility]. Significant PVD as outlined above + multiple other CRFs including HTN, DM, HLD and h/o tobacco abuse, but she has never had a MI. Per Dr. Kennon Holter clinic notes, she has never complained of angina. She had a NST in 2014, prior to being cleared for bilateral staged carotid endarterectomies, that was negative for ischemia. 2D echo 06/2018 showed normal LVEF, 73-53%, mild RV systolic function and severely elevated RV systolic pressure. No significant valvular disease. She also has a h/o GIB w/ Plavix, which has been discontinued. In 2017, she underwent left upper lobectomy for tx of lung cancer. Her PCP is Dr. Deatra Ina. It appears that she had been prescribed lasix in the past for edema.   Pt initially presented to the [facility] ED on 7/30 w/ CC of SOB and LEE. Pt has been on supplemental home O2 since March of this year, but recently developed worsening dyspnea and increasing O2 requirements. Has been hypoxic with O2 sats  in the 70s. She was seen by a provider several days before presenting to the ED and her lasix was increased from 20 to 40 mg daily. Despite increase in diuretic, her dyspnea persistent and edema failed to improve, prompting her to seek further evaluation.   In the ED, she was noted to be volume overloaded and was hypertensive, BP 155/101. RR 26. O2 sats 92% on RA. WBC 7.4. Hgb 11.9. COVID negative. CXR showed small bilateral pleural effusions R>L. Cardiomegaly and mild pulmonary vascular congestion also noted. Pro BNP markedly elevated at 1,147. EKG poor quality but showed NSR, 86 bpm, RAD, nonspecific ST abnormality. 2D echo 7/31 showed normal LVEF, >65% but severe pulmonary HTN w/ small pericardial effusion, no evidence of tamponade. Right ventricular systolic pressure is severely elevated with an estimated pressure of  65.8 mmHg.  She was admitted by IM and placed on IV diuretics, 60 mg Lasix BID. Good diuresis. Net I/Os since admit -6.5L. Weight down from 187 lb day of admit to 176 lb (pt reports normal weight ~172 lb). Diuretics now on hold, after she developed hypotension. SBP was in the 70s. Last dose of Lasix was morning of 8/4. BP improved today at 147/79. Current meds reviewed. She is also currently on Cardizem 120 mg daily and metoprolol 25 mg BID. Was also previously on lisinopril but this is also on hold. Cardiology consulted to assist with further management of CHF.   She is still requiring higher O2 requirement. Her previous baseline was 2L/min. She is now on 4L/min. Decreased BS bilaterally. No crackles.  1+ bilateral LEE on exam.    Heart Pathway Score:     Past Medical History:  Diagnosis Date   Anxiety    Carotid artery disease (HCC)    Claudication (HCC)    COPD (chronic obstructive pulmonary disease) (HCC)    Diabetes mellitus    Dizzy    GERD (gastroesophageal reflux disease)    Headache    History of kidney stones    Hypercholesteremia    Hypertension     Peripheral arterial disease (HCC)    bilateral iliac artery stenosis by angiography   Shortness of breath    with exertion   Stomach ulcer    Tobacco abuse     Past Surgical History:  Procedure Laterality Date   ABDOMINAL HYSTERECTOMY     APPENDECTOMY     BREAST REDUCTION SURGERY     CAROTID ANGIOGRAM N/A 02/25/2013   Procedure: CAROTID ANGIOGRAM;  Surgeon: Lorretta Harp, MD;  Location: [facility] CATH LAB;  Service: Cardiovascular;  Laterality: N/A;   ENDARTERECTOMY Right 03/06/2013   Procedure: ENDARTERECTOMY CAROTID-RIGHT;  Surgeon: Serafina Mitchell, MD;  Location: [place];  Service: Vascular;  Laterality: Right;   ENDARTERECTOMY Left 05/07/2013   Procedure: LEFT CAROTID ARTERY ENDARTERECTOMY WITH VASCU-GUARD PATCH ANGIOPLASTY ;  Surgeon: Serafina Mitchell, MD;  Location: [place];  Service: Vascular;  Laterality: Left;   LOBECTOMY Left 02/03/2016   Procedure: LEFT UPPER LOBECTOMY;  Surgeon: [name], MD;  Location: [place];  Service: Thoracic;  Laterality: Left;   LOWER EXTREMITY ANGIOGRAM N/A 02/25/2013   Procedure: LOWER EXTREMITY ANGIOGRAM;  Surgeon: Lorretta Harp, MD;  Location[facility] CATH LAB;  Service: Cardiovascular;  Laterality: N/A;   LOWER EXTREMITY ANGIOGRAM N/A 07/27/2013   Procedure: LOWER EXTREMITY ANGIOGRAM;  Surgeon: Lorretta Harp, MD;  Location: [facility] CATH LAB;  Service: Cardiovascular;  Laterality: N/A;   PATCH ANGIOPLASTY Right 03/06/2013   Procedure: PATCH ANGIOPLASTY of Right Carotid Artery using Vascu-Guard Patch;  Surgeon: Serafina Mitchell, MD;  Location: [place];  Service: Vascular;  Laterality: Right;   PERIPHERAL VASCULAR CATHETERIZATION N/A 11/15/2015   Procedure: Aortic Arch Angiography;  Surgeon: Serafina Mitchell, MD;  Location: [place] CV LAB;  Service: Cardiovascular;  Laterality: N/A;   PERIPHERAL VASCULAR CATHETERIZATION Bilateral 11/15/2015   Procedure: Carotid Angiography;  Surgeon: Serafina Mitchell, MD;  Location: [place] CV LAB;   Service: Cardiovascular;  Laterality: Bilateral;   PERIPHERAL VASCULAR CATHETERIZATION Left 11/15/2015   Procedure: Upper Extremity Angiography;  Surgeon: Serafina Mitchell, MD;  Location: [place] CV LAB;  Service: Cardiovascular;  Laterality: Left;   PERIPHERAL VASCULAR CATHETERIZATION Left 11/15/2015   Procedure: Peripheral Vascular Intervention;  Surgeon: Serafina Mitchell, MD;  Location: [place] CV LAB;  Service: Cardiovascular;  Laterality: Left;  subclavian    SALIVARY GLAND SURGERY     scar tissue removed from left saliva glad   TUBAL LIGATION     VIDEO ASSISTED THORACOSCOPY (VATS)/WEDGE RESECTION Left 02/03/2016   Procedure: VIDEO ASSISTED THORACOSCOPY;  Surgeon: [name], MD;  Location: [place];  Service: Thoracic;  Laterality: Left;     Home Medications:  Prior to Admission medications   Medication Sig Start Date End Date Taking? Authorizing Provider  albuterol (PROVENTIL HFA;VENTOLIN HFA) 108 (90 Base) MCG/ACT inhaler Inhale 2 puffs into the lungs every 6 (six) hours as needed for wheezing or shortness of breath.  07/11/17  Yes [provider]  albuterol (PROVENTIL) (2.5 MG/3ML) 0.083% nebulizer solution Take 3  mLs (2.5 mg total) by nebulization every 6 (six) hours as needed for wheezing or shortness of breath. 11/13/18  Yes Martyn Ehrich, NP  aspirin 81 MG chewable tablet Chew by mouth daily.   Yes [provider]  BIOTIN 5000 PO Take 5,000 mg by mouth daily before supper.    Yes [provider]  calcium carbonate (CALCIUM 600) 600 MG TABS tablet Take 600 mg by mouth daily before supper.    Yes [provider]  clopidogrel (PLAVIX) 75 MG tablet Take 1 tablet (75 mg total) by mouth daily. 06/20/18  Yes Thurnell Lose, MD  diazepam (VALIUM) 2 MG tablet Take 5 mg by mouth every 8 (eight) hours as needed for anxiety.    Yes [provider]  diltiazem (DILACOR XR) 180 MG 24 hr capsule Take 180 mg by mouth daily.  07/16/16  Yes  [provider]  furosemide (LASIX) 20 MG tablet Take 20-40 mg by mouth daily.    Yes [provider]  HYDROcodone-acetaminophen (NORCO/VICODIN) 5-325 MG tablet Take 1 tablet by mouth every 12 (twelve) hours as needed for moderate pain. 06/20/18  Yes Thurnell Lose, MD  lisinopril (ZESTRIL) 10 MG tablet Take 10 mg by mouth daily.   Yes [provider]  MAGNESIUM PO Take 1 tablet by mouth daily before supper.   Yes [provider]  meclizine (ANTIVERT) 12.5 MG tablet Take 12.5 mg by mouth 3 (three) times daily as needed for dizziness or nausea.  09/30/18  Yes [provider]  Menthol, Topical Analgesic, (ICY HOT EX) Apply 1 spray topically every 8 (eight) hours as needed (pain).   Yes [provider]  metFORMIN (GLUCOPHAGE) 500 MG tablet Take by mouth 2 (two) times daily with a meal.   Yes [provider]  metoprolol tartrate (LOPRESSOR) 50 MG tablet Take 1 tablet (50 mg total) by mouth 2 (two) times daily with a meal. 06/20/18  Yes Thurnell Lose, MD  Omega-3 Fatty Acids (FISH OIL PO) Take 1 capsule by mouth daily.   Yes [provider]  ondansetron (ZOFRAN) 4 MG tablet Take 4 mg by mouth at bedtime as needed for nausea or vomiting.  06/05/18  Yes [provider]  pantoprazole (PROTONIX) 40 MG tablet Take 40 mg by mouth 2 (two) times daily.  12/17/13  Yes [provider]  pravastatin (PRAVACHOL) 40 MG tablet Take 40 mg by mouth daily before supper.    Yes [provider]  traZODone (DESYREL) 50 MG tablet Take 50 mg by mouth at bedtime.   Yes [provider]  Fluticasone-Umeclidin-Vilant (TRELEGY ELLIPTA) 100-62.5-25 MCG/INH AEPB Inhale 1 puff into the lungs daily. 11/13/18   Martyn Ehrich, NP  insulin aspart (NOVOLOG) 100 UNIT/ML injection Substitute to any brand approved.Before each meal 3 times a day, 140-199 - 2 units, 200-250 - 4 units, 251-299 - 6 units,  300-349 - 8 units,  350 or above  10 units. Dispense syringes and needles as needed, Ok to switch to PEN if approved. DX DM2, Code E11.65 Patient not taking: Reported on 11/13/2018 06/20/18   Thurnell Lose, MD  insulin glargine (LANTUS) 100 UNIT/ML injection Inject 0.08 mLs (8 Units total) into the skin at bedtime. Dispense insulin pen if approved, if not dispense as needed syringes and needles for 1 month supply. Can switch to Levemir. Diagnosis E 11.65. Patient not taking: Reported on 10/03/2018 06/20/18   Thurnell Lose, MD  ONE TOUCH ULTRA TEST test  strip  09/12/16   [provider]    Inpatient Medications: Scheduled Meds:  aspirin  81 mg Oral Daily   bisacodyl  10 mg Rectal Once   budesonide (PULMICORT) nebulizer solution  0.25 mg Nebulization BID   calcium carbonate  1,250 mg Oral QAC supper   Chlorhexidine Gluconate Cloth  6 each Topical Daily   clopidogrel  75 mg Oral Daily   [START ON 11/20/2018] diltiazem  180 mg Oral Daily   heparin  5,000 Units Subcutaneous Q8H   insulin aspart  0-5 Units Subcutaneous QHS   insulin aspart  0-9 Units Subcutaneous TID WC   insulin glargine  8 Units Subcutaneous QHS   magnesium oxide  200 mg Oral QAC supper   mouth rinse  15 mL Mouth Rinse BID   [START ON 11/20/2018] metoprolol tartrate  50 mg Oral BID WC   omega-3 acid ethyl esters  1 g Oral Daily   pantoprazole  40 mg Oral BID   polyethylene glycol  17 g Oral BID   pravastatin  40 mg Oral QAC supper   sodium chloride flush  3 mL Intravenous Q12H   traZODone  50 mg Oral QHS   umeclidinium-vilanterol  1 puff Inhalation Daily   Continuous Infusions:  sodium chloride     albumin human     PRN Meds: sodium chloride, acetaminophen, albuterol, diazepam, Glycerin (Adult), HYDROcodone-acetaminophen, meclizine, Muscle Rub, ondansetron (ZOFRAN) IV, ondansetron, sodium chloride flush  Allergies:    Allergies  Allergen Reactions   No Known Allergies Other (See Comments)    Social History:     Social History   Socioeconomic History   Marital status: Widowed    Spouse name: Not on file   Number of children: Not on file   Years of education: Not on file   Highest education level: Not on file  Occupational History   Not on file  Social Needs   Financial resource strain: Not on file   Food insecurity    Worry: Not on file    Inability: Not on file   Transportation needs    Medical: Not on file    Non-medical: Not on file  Tobacco Use   Smoking status: Former Smoker    Packs/day: 1.50    Years: 56.00    Pack years: 84.00    Types: Cigarettes    Quit date: 07/07/2013    Years since quitting: 5.3   Smokeless tobacco: Former Systems developer    Quit date: 02/25/2013  Substance and Sexual Activity   Alcohol use: No   Drug use: No   Sexual activity: Never  Lifestyle   Physical activity    Days per week: Not on file    Minutes per session: Not on file   Stress: Not on file  Relationships   Social connections    Talks on phone: Not on file    Gets together: Not on file    Attends religious service: Not on file    Active member of club or organization: Not on file    Attends meetings of clubs or organizations: Not on file    Relationship status: Not on file   Intimate partner violence    Fear of current or ex partner: Not on file    Emotionally abused: Not on file    Physically abused: Not on file    Forced sexual activity: Not on file  Other Topics Concern   Not on file  Social History Narrative   Not  on file    Family History:    Family History  Problem Relation Age of Onset   Stroke Mother    Hypertension Mother    Kidney failure Father      ROS:  Please see the history of present illness.   All other ROS reviewed and negative.     Physical Exam/Data:   Vitals:   11/19/18 0100 11/19/18 0249 11/19/18 0500 11/19/18 0700  BP: (!) 100/49  (!) 104/54 (!) 163/110  Pulse: 82  86 (!) 102  Resp: (!) 21  18 (!) 27  Temp:  97.9 F (36.6 C)     TempSrc:  Oral    SpO2: 96%  90% (!) 84%  Weight:      Height:        Intake/Output Summary (Last 24 hours) at 11/19/2018 0826 Last data filed at 11/19/2018 0709 Gross per 24 hour  Intake 1199.59 ml  Output 1250 ml  Net -50.41 ml   Last 3 Weights 11/18/2018 11/17/2018 11/16/2018  Weight (lbs) 176 lb 12.9 oz 181 lb 14.1 oz 180 lb 9.6 oz  Weight (kg) 80.2 kg 82.5 kg 81.92 kg     Body mass index is 30.35 kg/m.  General:  Well nourished, well developed WF, in no acute distress HEENT: normal Lymph: no adenopathy Neck: no JVD Endocrine:  No thryomegaly Vascular: No carotid bruits; FA pulses 2+ bilaterally without bruits  Cardiac:  normal S1, S2; RRR; no murmur  Lungs:  Decreased BS bilaterally at the bases  Abd: soft, nontender, no hepatomegaly  Ext: 1+ bilateral LEE Musculoskeletal:  No deformities, BUE and BLE strength normal and equal Skin: warm and dry  Neuro:  CNs 2-12 intact, no focal abnormalities noted Psych:  Normal affect   EKG:  The EKG was personally reviewed and demonstrates:   poor quality but shows NSR, 86 bpm, RAD, nonspecific ST abnormality.    Telemetry:  Telemetry was personally reviewed and demonstrates:  Sinus tach 104 bpm   Relevant CV Studies: 2D Echo 11/14/2018 IMPRESSIONS    1. The left ventricle has hyperdynamic systolic function, with an ejection fraction of >65%. The cavity size was normal. There is moderate asymmetric left ventricular hypertrophy of the septal wall. Left ventricular diastolic Doppler parameters are  indeterminate.  2. The right ventricle has mildly reduced systolic function. The cavity was severely enlarged. There is no increase in right ventricular wall thickness. Right ventricular systolic pressure is severely elevated with an estimated pressure of 65.8 mmHg.  3. Small pericardial effusion.  4. No stenosis of the aortic valve.  5. The aorta is normal in size and structure.  6. The aortic root is normal in size and structure.  7.  The inferior vena cava was dilated in size with >50% respiratory variability.  SUMMARY   Severe pulmonary hypertension with small pericardial effusion, no evidence of tamponade. RV limited views but appears severely enlarged with echo contrast (definity).  Laboratory Data:  High Sensitivity Troponin:  No results for input(s): TROPONINIHS in the last 720 hours.   Cardiac EnzymesNo results for input(s): TROPONINI in the last 168 hours. No results for input(s): TROPIPOC in the last 168 hours.  Chemistry Recent Labs  Lab 11/17/18 0449 11/18/18 0431 11/19/18 0222  NA 138 139 139  K 3.9 4.2 4.2  CL 89* 89* 91*  CO2 37* 40* 42*  GLUCOSE 210* 146* 141*  BUN _0 CREATININE 0.90 0.87 0.75  CALCIUM 9.0 9.3 9.1  GFRNONAA >60 >60 >  60  GFRAA >60 >60 >60  ANIONGAP _0 Recent Labs  Lab 11/17/18 0449 11/18/18 0431 11/19/18 0222  PROT 6.5 7.0 6.3*  ALBUMIN 3.6 3.9 3.5  AST 34 34 44*  ALT _1 ALKPHOS 44 50 47  BILITOT 0.4 0.7 0.5   Hematology Recent Labs  Lab 11/17/18 0449 11/18/18 0431 11/19/18 0222  WBC 5.7 6.4 6.4  RBC 4.33 4.41 4.26  HGB 11.1* 11.8* 11.2*  HCT 39.7 40.4 39.2  MCV 91.7 91.6 92.0  MCH 25.6* 26.8 26.3  MCHC 28.0* 29.2* 28.6*  RDW 13.6 13.5 13.6  PLT 237 235 223   BNP Recent Labs  Lab 11/13/18 1434 11/14/18 0431 11/18/18 0431  BNP  --  528.4* 422.3*  PROBNP 1,147.0*  --   --     DDimer No results for input(s): DDIMER in the last 168 hours.   Radiology/Studies:  No results found.  Assessment and Plan:   Caitlin Oliver is a 78 y.o. female with a hx of significant PVD including high grade bilateral carotid artery disease s/p staged right followed by left carotid endarterectomy performed by Dr.Brabham in 2014, left subclavian stenting in 2017, LE arterial disease s/p bilateral common iliac rotational atherectomy PTA and stenting in 2015, HTN, HLD, DM, tobacco abuse, COPD, lung cancer (stage IA squamous cell carcinoma) s/p left  upper lobectomy in 2017, chronic home O2 (2L/min baseline), CVA, h/o GIB w/ Plavix and HFpEF, who is being seen today for the evaluation of acute CHF at the request of Dr. Nevada Crane, Internal Medicine.   1. HFpEF: EF >65% but has RV dysfunction/ severe pulmonary HTN. Pro BNP markedly elevated on admit at 1,147.0. She has decreased BS bilaterally at the bases but no crackles. 1+ bilateral LEE on exam and still with increased supplemental O2 requirements, increased from baseline of 2L/min to now 4 L/min [place]. Lasix on hold due to hypotension yesterday but BP now improved. I think she would benefit from a RHC to better assess her volume status and pulmonary pressures to help guide further diuresis.   2. Severe Pulmonary HTN: likely 2/2 underlying pulmonary disease. H/o squamous cell carcinoma of the lung, s/p left upper lobectomy in 2017 and COPD on chronic home O2. Echo this admit showed right ventricular systolic pressure is severely elevated with an estimated pressure of  65.8 mmHg. As noted above, consider RHC for further assessment. She may need additional diuresis.   Other chronic medical conditions, as outlined above, appear to be stable.    For questions or updates, please contact [place] Please consult [email or web address] for contact info under     Signed, Lyda Jester, PA-C  11/19/2018 8:26 AM   History and all data above reviewed.  Patient examined.  I agree with the findings as above.  Difficult situation.  Patient is seen for years ago by Dr. Melvyn Novas and was managed for COPD.  She has worsening oxygen saturations recently.  I reviewed her primary care notes.  She had to have her home O2 increased from 2 to 4 L.  He has had some increased lower extremity swelling.  She said this was since the time of her hospitalization for a stroke in [place].  Has had increased abdominal distention.  She presented the hospital because she could not keep her sats increased to even with 4 L and was having  more dyspnea with minimal exertion or at rest.  She has not had any fevers cough or  chills.  He has not had any chest pressure.  Difficult to diurese her as she is become hypotensive.  Today it has been just moving around a little bit she has sats quickly and becomes tachypneic.  Her echocardiogram as above with evidence of severe pulmonary hypertension and significant right ventricular dysfunction.   The patient exam reveals COR:RRR  ,  Lungs: Decreased breath sounds  ,  Abd: Distended, Ext Moderate bilateral edema  .  All available labs, radiology testing, previous records reviewed. Agree with documented assessment and plan. Acute on chronic diastolic dysfunction: She has severe multi factorial lung disease.  She has pulmonary hypertension as a result of this most likely although primary etiologies have not been clearly excluded.  She has RV dysfunction then likely as a result of the pulmonary pressures been elevated.  This makes diuresis very difficult she does have some edema on chest x-ray elevated BNP and there probably is a component of left-sided diastolic dysfunction contributing to the worsening oxygenation.  At this point I would suggest consulting pulmonary for help in follow-up of her chronic lung presentation and worsening pulmonary hypertension.  I will stop the Cardizem and attempt to get more blood pressure work with try to improve his diuresis.  She will need a right heart catheterization and she might need transfer to [facility] for this to further evaluate whether she benefited overall remaining implants vasoactive medications.  Jeneen Rinks Mcarthur Ivins  11:44 AM  11/19/2018

## 2018-11-19 NOTE — Telephone Encounter (Signed)
Hope from [place] is returning phone call.  Hope phone number is [phone].

## 2018-11-19 NOTE — Telephone Encounter (Signed)
LMTCB x2 for [facility].

## 2018-11-19 NOTE — Progress Notes (Signed)
Patient ShOB after eating and toileting pt stating she was just winded. Patient then started having left breast pain. Patient denied heaviness/ pressure and no arm or jaw pain. States pain in new but happens occasionally when winded. Metoprolol given for elevated BP and HR as ordered by MD. EKG preformed MD paged. Will continue to monitor. Hydrocodone given as requested by patient.

## 2018-11-19 NOTE — Progress Notes (Signed)
Xcover Pt states having some left sided chest tightness, almost gone now. Lasted about 15 minutes-30 minutes.  + sob, denies fever, chills, cough, palp, n/v.   Pt notes has had intermittent chest tightness and sob for months.   Exam: T98.3,  P 113, R 30 Bp 137/123?  Pox 98% on 02 [place]  Heent: anicteric,  Neck: slight jvd Heart: tachy s1, s2,  Lung few crackles at bilateral base, no wheezing ABd: soft, obese,  Ext: no c/c/e  A/p Chest pain Cardiac echo 11/14/2018=> EF > 65%, severe pulmonary hypertension, small pericardial effusion Tele, Trop I q2h x2,  Check d dimer, If d dimer positive then CTA chest Check Lipid, prior LDL =44 (06/17/2018), HDL 31 (06/17/2018) Cont Metoprolol Cont Aspirin, Plavix Start Pravastatin 20m po qhs  Tachycardia Tele Cont metoprolol, may need upward titration Cardizem 360mpo x1 given, please resume cardizem home dose in AM  Dm2 (hga1c=7.9, 06/17/2018)

## 2018-11-19 NOTE — Progress Notes (Signed)
PROGRESS NOTE  Caitlin Oliver VHS:[id number] DOB: 20-Aug-1940 DOA: 11/13/2018 PCP: [name], PA-C  HPI/Recap of past 24 hours: Caitlin East Anthonyis a 78 y.o.femalewith medical history significant ofadvanced COPD followed by pulmonology, diastolic dysfunction CHF with EF in March of this year showing 60%, anxiety disorder, peripheral vascular disease, diabetes, GERD, hyperlipidemia, hypertension who presented to the ER with progressive shortness of breath leg swelling and cough. Patient had CVA in March of this year. She also has had lung cancer as well as coronary artery disease. All these have been under control. She has been on 2 L of oxygen at home since March of this year. Patient has noticed worsening shortness of breath in the last 2 weeks including oxygen dropping into the 70s saturation. Also mainly exertional. Some PND and orthopnea. She was seen in the outpatient setting was on 20 mg of Lasix which was increased to 40 mg 2 days ago. She had increased urination but still continues to have more shortness of breath and leg swelling. She came to the ER where she was evaluated. Patient has evidence of fluid overload and is being admitted with a diagnosis of acute exacerbation of diastolic CHF.[name]  ED Course:Temperature is 98.4 blood pressure 155/101 pulse 89 respiratory of 26 oxygen sats 92% on room air. White count 7.4 hemoglobin 11.9 and platelets of 271. Chemistry appears to be normal except for CO2 of 39 chloride 93. COVID-19 testing is negative. Chest x-ray shows small bilateral pleural effusions more on the right. Cardiomegaly and mild pulmonary vascular congestion.  11/19/18: Patient was seen and examined at her bedside this morning.  Reports nonproductive cough.  Denies chest pain.  She is on 2 L of oxygen at home continuously and currently requiring 3L.  Assessment/Plan: Principal Problem:   Acute on chronic combined systolic and diastolic CHF (congestive heart  failure) (HCC) Active Problems:   Essential hypertension   Type 2 diabetes mellitus (HCC)   PVD (peripheral vascular disease) with claudication (HCC)   Carotid artery obstruction   COPD GOLD II   Cancer of left lung (HCC)   Acute on chronic diastolic CHF/pulmonary hypertension Presented with a BNP greater than 400, last 2D echo done on 11/14/2018 showed preserved LVEF greater than 65% with right ventricular systolic pressure severely elevated 65. Diuresing stopped due to hypotension Net I&O -6.5 L since admission Last weighed 80.2 kg on 08/18/2018 Continue strict I's and O's and daily weight  Peripheral vascular disease with chronic bilateral lower extremity tenderness Self-reported tenderness with palpation of lower extremities bilaterally is chronic Continue aspirin, Plavix and atorvastatin  Contraction alkalosis  Bicarb 42 on 11/19/2018 Continue to hold off diuretics Received IV fluid due to hypotension Repeat BMP in the morning  Acute on chronic hypoxic respiratory failure in the setting of COPD On 2 L of oxygen continuously at baseline Currently on 3 L with O2 saturation low 90s No wheezing on exam Maintain O2 saturation greater than 90% Continue nebulizers  Resolved hypotension Hypotensive this morning MAP 57, received 250 cc normal saline bolus and 50 g IV albumin x1 Maintain map greater than 65 Resume p.o. Cardizem and p.o. metoprolol tartrate, reduced doses. Continue to closely monitor vital signs  Left lower extremity cellulitis Completed 5 days of Keflex Erythema and warmth have resolved Afebrile with no leukocytosis  Type 2 diabetes Continue insulin sliding scale Last hemoglobin A1c 7.9 on 06/17/2018  Chronic anxiety/depression Continue home psych medications  GERD Continue PPI      DVT prophylaxis:  heparin subcu 3 times daily Code Status: full  Family Communication:  Will call family and update. Disposition Plan:  Possible DC to home tomorrow or when  cardiology signs off.  Consultants:  Cardiology    Objective: Vitals:   11/19/18 0249 11/19/18 0500 11/19/18 0700 11/19/18 0725  BP:  (!) 104/54 (!) 163/110   Pulse:  86 (!) 102   Resp:  18 (!) 27   Temp: 97.9 F (36.6 C)   97.6 F (36.4 C)  TempSrc: Oral   Oral  SpO2:  90% (!) 84%   Weight:      Height:        Intake/Output Summary (Last 24 hours) at 11/19/2018 4360 Last data filed at 11/19/2018 1658 Gross per 24 hour  Intake 1199.59 ml  Output 1250 ml  Net -50.41 ml   Filed Weights   11/16/18 0500 11/17/18 0531 11/18/18 1200  Weight: 81.9 kg 82.5 kg 80.2 kg    Exam:  . General: 78 y.o. year-old female well developed well nourished in no acute distress.  Alert and oriented x3. . Cardiovascular: Regular rate and rhythm with no rubs or gallops.  No thyromegaly or JVD noted.   [name] Respiratory: Mild rales at bases no wheezes noted.  Poor inspiratory effort. . Abdomen: Soft nontender nondistended with normal bowel sounds x4 quadrants. . Musculoskeletal: Trace lower extremity edema bilaterally.  Tenderness on palpation of lower extremities bilaterally, states tenderness is chronic.  [name] Psychiatry: Mood is appropriate for condition and setting   Data Reviewed: CBC: Recent Labs  Lab 11/13/18 1434  11/14/18 0431 11/15/18 0610 11/16/18 0545 11/17/18 0449 11/18/18 0431 11/19/18 0222  WBC 7.4   < > 7.6 7.3 8.3 5.7 6.4 6.4  NEUTROABS 6.0  --  6.0  --   --   --   --   --   HGB 11.9*   < > 12.1 11.4* 12.3 11.1* 11.8* 11.2*  HCT 38.6   < > 41.7 40.5 42.6 39.7 40.4 39.2  MCV 85.3   < > 90.1 91.8 91.6 91.7 91.6 92.0  PLT 271.0   < > 239 254 275 237 235 223   < > = values in this interval not displayed.   Basic Metabolic Panel: Recent Labs  Lab 11/15/18 0610 11/16/18 0545 11/17/18 0449 11/18/18 0431 11/19/18 0222  NA 139 140 138 139 139  K 3.6 3.8 3.9 4.2 4.2  CL 91* 89* 89* 89* 91*  CO2 38* 39* 37* 40* 42*  GLUCOSE 154* 126* 210* 146* 141*  BUN _0 CREATININE 0.76 0.85 0.90 0.87 0.75  CALCIUM 8.8* 9.3 9.0 9.3 9.1  MG 1.7 2.0 2.1 2.1 2.2   GFR: Estimated Creatinine Clearance: 59.4 mL/min (by C-G formula based on SCr of 0.75 mg/dL). Liver Function Tests: Recent Labs  Lab 11/15/18 0610 11/16/18 0545 11/17/18 0449 11/18/18 0431 11/19/18 0222  AST 20 20 34 34 44*  ALT _1 ALKPHOS 42 45 44 50 47  BILITOT 0.5 0.5 0.4 0.7 0.5  PROT 6.4* 7.0 6.5 7.0 6.3*  ALBUMIN 3.6 3.9 3.6 3.9 3.5   No results for input(s): LIPASE, AMYLASE in the last 168 hours. No results for input(s): AMMONIA in the last 168 hours. Coagulation Profile: No results for input(s): INR, PROTIME in the last 168 hours. Cardiac Enzymes: No results for input(s): CKTOTAL, CKMB, CKMBINDEX, TROPONINI in the last 168 hours. BNP (last 3 results) Recent Labs    11/13/18  [place] 1,147.0*   HbA1C: No results for input(s): HGBA1C in the last 72 hours. CBG: Recent Labs  Lab 11/18/18 0742 11/18/18 1226 11/18/18 1635 11/18/18 2139 11/19/18 0735  GLUCAP 150* 201* 112* 216* 121*   Lipid Profile: No results for input(s): CHOL, HDL, LDLCALC, TRIG, CHOLHDL, LDLDIRECT in the last 72 hours. Thyroid Function Tests: No results for input(s): TSH, T4TOTAL, FREET4, T3FREE, THYROIDAB in the last 72 hours. Anemia Panel: No results for input(s): VITAMINB12, FOLATE, FERRITIN, TIBC, IRON, RETICCTPCT in the last 72 hours. Urine analysis:    Component Value Date/Time   COLORURINE YELLOW 11/16/2018 [place] 11/16/2018 1349   LABSPEC 1.015 11/16/2018 1349   PHURINE 6.0 11/16/2018 1349   GLUCOSEU NEGATIVE 11/16/2018 1349   HGBUR NEGATIVE 11/16/2018 1349   BILIRUBINUR NEGATIVE 11/16/2018 1349   KETONESUR NEGATIVE 11/16/2018 1349   PROTEINUR NEGATIVE 11/16/2018 1349   UROBILINOGEN 0.2 04/30/2013 1337   NITRITE NEGATIVE 11/16/2018 1349   LEUKOCYTESUR TRACE (A) 11/16/2018 1349   Sepsis Labs: _0 (procalcitonin:4,lacticidven:4)  )  Recent Results (from the past 240 hour(s))  Novel Coronavirus, NAA ([facility])     Status: None   Collection Time: 11/13/18 12:00 AM  Result Value Ref Range Status   SARS-CoV-2, NAA Not Detected Not Detected Final    Comment: This test was developed and its performance characteristics determined by [employer]. This test has not been FDA cleared or approved. This test has been authorized by FDA under an Emergency Use Authorization (EUA). This test is only authorized for the duration of time the declaration that circumstances exist justifying the authorization of the emergency use of in vitro diagnostic tests for detection of SARS-CoV-2 virus and/or diagnosis of COVID-19 infection under section 564(b)(1) of the Act, 21 U.S.C. 976BHA-1(P)(3), unless the authorization is terminated or revoked sooner. When diagnostic testing is negative, the possibility of a false negative result should be considered in the context of a patient's recent exposures and the presence of clinical signs and symptoms consistent with COVID-19. An individual without symptoms of COVID-19 and who is not shedding SARS-CoV-2 virus would expect to have a negative (not detected) result in this assay.   SARS Coronavirus 2 (CEPHEID - Performed in [place] hospital lab), Hosp Order     Status: None   Collection Time: 11/13/18  8:12 PM   Specimen: Nasopharyngeal Swab  Result Value Ref Range Status   SARS Coronavirus 2 NEGATIVE NEGATIVE Final    Comment: (NOTE) If result is NEGATIVE SARS-CoV-2 target nucleic acids are NOT DETECTED. The SARS-CoV-2 RNA is generally detectable in upper and lower  respiratory specimens during the acute phase of infection. The lowest  concentration of SARS-CoV-2 viral copies this assay can detect is 250  copies / mL. A negative result does not preclude SARS-CoV-2 infection  and should not be used as the sole basis for treatment or other  patient management decisions.  A negative  result may occur with  improper specimen collection / handling, submission of specimen other  than nasopharyngeal swab, presence of viral mutation(s) within the  areas targeted by this assay, and inadequate number of viral copies  (<250 copies / mL). A negative result must be combined with clinical  observations, patient history, and epidemiological information. If result is POSITIVE SARS-CoV-2 target nucleic acids are DETECTED. The SARS-CoV-2 RNA is generally detectable in upper and lower  respiratory specimens dur ing the acute phase of infection.  Positive  results are indicative of active infection with SARS-CoV-2.  Clinical  correlation with patient history and other diagnostic information is  necessary to determine patient infection status.  Positive results do  not rule out bacterial infection or co-infection with other viruses. If result is PRESUMPTIVE POSTIVE SARS-CoV-2 nucleic acids MAY BE PRESENT.   A presumptive positive result was obtained on the submitted specimen  and confirmed on repeat testing.  While 2019 novel coronavirus  (SARS-CoV-2) nucleic acids may be present in the submitted sample  additional confirmatory testing may be necessary for epidemiological  and / or clinical management purposes  to differentiate between  SARS-CoV-2 and other Sarbecovirus currently known to infect humans.  If clinically indicated additional testing with an alternate test  methodology [phone]) is advised. The SARS-CoV-2 RNA is generally  detectable in upper and lower respiratory sp ecimens during the acute  phase of infection. The expected result is Negative. Fact Sheet for Patients:  StrictlyIdeas.no Fact Sheet for Healthcare Providers: BankingDealers.co.za This test is not yet approved or cleared by the [place] FDA and has been authorized for detection and/or diagnosis of SARS-CoV-2 by FDA under an Emergency Use Authorization  (EUA).  This EUA will remain in effect (meaning this test can be used) for the duration of the COVID-19 declaration under Section 564(b)(1) of the Act, 21 U.S.C. section 360bbb-3(b)(1), unless the authorization is terminated or revoked sooner. Performed at [facility], [place] [address] [place], [place] [address]       Studies: No results found.  Scheduled Meds: . aspirin  81 mg Oral Daily  . bisacodyl  10 mg Rectal Once  . budesonide (PULMICORT) nebulizer solution  0.25 mg Nebulization BID  . calcium carbonate  1,250 mg Oral QAC supper  . Chlorhexidine Gluconate Cloth  6 each Topical Daily  . clopidogrel  75 mg Oral Daily  . [START ON 11/20/2018] diltiazem  120 mg Oral Daily  . heparin  5,000 Units Subcutaneous Q8H  . insulin aspart  0-5 Units Subcutaneous QHS  . insulin aspart  0-9 Units Subcutaneous TID WC  . insulin glargine  8 Units Subcutaneous QHS  . magnesium oxide  200 mg Oral QAC supper  . mouth rinse  15 mL Mouth Rinse BID  . [START ON 11/20/2018] metoprolol tartrate  25 mg Oral BID WC  . omega-3 acid ethyl esters  1 g Oral Daily  . pantoprazole  40 mg Oral BID  . polyethylene glycol  17 g Oral BID  . pravastatin  40 mg Oral QAC supper  . sodium chloride flush  3 mL Intravenous Q12H  . traZODone  50 mg Oral QHS  . umeclidinium-vilanterol  1 puff Inhalation Daily    Continuous Infusions: . sodium chloride    . albumin human       LOS: 6 days     Kayleen Memos, MD [facility] Pager [phone]  If 7PM-7AM, please contact night-coverage [email or web address] Password [facility] 11/19/2018, 8:33 AM

## 2018-11-19 NOTE — Telephone Encounter (Signed)
I have sent in a 90 day supply of the pt's albuterol neb solution as we keep playing phone tag with [facility]. This is delaying the pt getting her medication. Nothing further was needed.

## 2018-11-20 ENCOUNTER — Encounter (HOSPITAL_COMMUNITY)
Admission: EM | Disposition: A | Discharge status: Home Health | Payer: Self-pay | Source: Home / Self Care | Attending: Family Medicine

## 2018-11-20 DIAGNOSIS — I2510 Atherosclerotic heart disease of native coronary artery without angina pectoris: Secondary | ICD-10-CM

## 2018-11-20 DIAGNOSIS — I10 Essential (primary) hypertension: Secondary | ICD-10-CM

## 2018-11-20 DIAGNOSIS — Z794 Long term (current) use of insulin: Secondary | ICD-10-CM

## 2018-11-20 DIAGNOSIS — I6529 Occlusion and stenosis of unspecified carotid artery: Secondary | ICD-10-CM

## 2018-11-20 DIAGNOSIS — C3432 Malignant neoplasm of lower lobe, left bronchus or lung: Secondary | ICD-10-CM

## 2018-11-20 DIAGNOSIS — I5033 Acute on chronic diastolic (congestive) heart failure: Secondary | ICD-10-CM

## 2018-11-20 DIAGNOSIS — J449 Chronic obstructive pulmonary disease, unspecified: Secondary | ICD-10-CM

## 2018-11-20 DIAGNOSIS — I739 Peripheral vascular disease, unspecified: Secondary | ICD-10-CM

## 2018-11-20 DIAGNOSIS — E1169 Type 2 diabetes mellitus with other specified complication: Secondary | ICD-10-CM

## 2018-11-20 DIAGNOSIS — I200 Unstable angina: Secondary | ICD-10-CM

## 2018-11-20 HISTORY — PX: RIGHT/LEFT HEART CATH AND CORONARY ANGIOGRAPHY: CATH118266

## 2018-11-20 LAB — POCT I-STAT EG7
Acid-Base Excess: 12 mmol/L — ABNORMAL HIGH (ref 0.0–2.0)
Bicarbonate: 41 mmol/L — ABNORMAL HIGH (ref 20.0–28.0)
Calcium, Ion: 1.28 mmol/L (ref 1.15–1.40)
HCT: 37 % (ref 36.0–46.0)
Hemoglobin: 12.6 g/dL (ref 12.0–15.0)
O2 Saturation: 49 %
Potassium: 3.9 mmol/L (ref 3.5–5.1)
Sodium: 142 mmol/L (ref 135–145)
TCO2: 43 mmol/L — ABNORMAL HIGH (ref 22–32)
pCO2, Ven: 75.3 mmHg (ref 44.0–60.0)
pH, Ven: 7.344 (ref 7.250–7.430)
pO2, Ven: 29 mmHg — CL (ref 32.0–45.0)

## 2018-11-20 LAB — GLUCOSE, CAPILLARY
Glucose-Capillary: 146 mg/dL — ABNORMAL HIGH (ref 70–99)
Glucose-Capillary: 151 mg/dL — ABNORMAL HIGH (ref 70–99)
Glucose-Capillary: 127 mg/dL — ABNORMAL HIGH (ref 70–99)
Glucose-Capillary: 209 mg/dL — ABNORMAL HIGH (ref 70–99)

## 2018-11-20 LAB — POCT I-STAT 7, (LYTES, BLD GAS, ICA,H+H)
Acid-Base Excess: 12 mmol/L — ABNORMAL HIGH (ref 0.0–2.0)
Bicarbonate: 40.3 mmol/L — ABNORMAL HIGH (ref 20.0–28.0)
Calcium, Ion: 1.27 mmol/L (ref 1.15–1.40)
HCT: 37 % (ref 36.0–46.0)
Hemoglobin: 12.6 g/dL (ref 12.0–15.0)
O2 Saturation: 88 %
Potassium: 3.9 mmol/L (ref 3.5–5.1)
Sodium: 141 mmol/L (ref 135–145)
TCO2: 42 mmol/L — ABNORMAL HIGH (ref 22–32)
pCO2 arterial: 69 mmHg (ref 32.0–48.0)
pH, Arterial: 7.374 (ref 7.350–7.450)
pO2, Arterial: 59 mmHg — ABNORMAL LOW (ref 83.0–108.0)

## 2018-11-20 LAB — MRSA PCR SCREENING: MRSA by PCR: POSITIVE — AB

## 2018-11-20 SURGERY — RIGHT/LEFT HEART CATH AND CORONARY ANGIOGRAPHY
Anesthesia: LOCAL

## 2018-11-20 MED ORDER — IOHEXOL 350 MG/ML SOLN
INTRAVENOUS | Status: DC | PRN
  Administered 2018-11-20: 60 mL via INTRAVENOUS

## 2018-11-20 MED ORDER — HEPARIN SODIUM (PORCINE) 1000 UNIT/ML IJ SOLN
INTRAMUSCULAR | Status: AC
  Filled 2018-11-20: qty 1

## 2018-11-20 MED ORDER — LIDOCAINE HCL (PF) 1 % IJ SOLN
INTRAMUSCULAR | Status: AC
  Filled 2018-11-20: qty 30

## 2018-11-20 MED ORDER — HYDRALAZINE HCL 20 MG/ML IJ SOLN: 10.0000 mg | INTRAMUSCULAR | Status: AC | PRN

## 2018-11-20 MED ORDER — VERAPAMIL HCL 2.5 MG/ML IV SOLN
INTRAVENOUS | Status: DC | PRN
  Administered 2018-11-20: 10 mL via INTRA_ARTERIAL

## 2018-11-20 MED ORDER — HEPARIN SODIUM (PORCINE) 5000 UNIT/ML IJ SOLN
5000.0000 [IU] | Freq: Three times a day (TID) | INTRAMUSCULAR | Status: DC
  Administered 2018-11-20 – 2018-11-23 (×9): 5000 [IU] via SUBCUTANEOUS
  Filled 2018-11-20 (×10): qty 1

## 2018-11-20 MED ORDER — SODIUM CHLORIDE 0.9% FLUSH: 3.0000 mL | INTRAVENOUS | Status: DC | PRN

## 2018-11-20 MED ORDER — CHLORHEXIDINE GLUCONATE CLOTH 2 % EX PADS
6.0000 | MEDICATED_PAD | Freq: Every day | CUTANEOUS | Status: DC
  Administered 2018-11-21 – 2018-11-24 (×3): 6 via TOPICAL

## 2018-11-20 MED ORDER — HEPARIN SODIUM (PORCINE) 1000 UNIT/ML IJ SOLN
INTRAMUSCULAR | Status: DC | PRN
  Administered 2018-11-20: 4000 [IU] via INTRAVENOUS

## 2018-11-20 MED ORDER — HEPARIN (PORCINE) IN NACL 1000-0.9 UT/500ML-% IV SOLN
INTRAVENOUS | Status: DC | PRN
  Administered 2018-11-20 (×2): 500 mL

## 2018-11-20 MED ORDER — HEPARIN (PORCINE) IN NACL 1000-0.9 UT/500ML-% IV SOLN
INTRAVENOUS | Status: AC
  Filled 2018-11-20: qty 1000

## 2018-11-20 MED ORDER — FUROSEMIDE 10 MG/ML IJ SOLN
40.0000 mg | Freq: Two times a day (BID) | INTRAMUSCULAR | Status: DC
  Administered 2018-11-20: 40 mg via INTRAVENOUS
  Filled 2018-11-20 (×2): qty 4

## 2018-11-20 MED ORDER — SODIUM CHLORIDE 0.9 % IV SOLN: 250.0000 mL | INTRAVENOUS | Status: DC | PRN

## 2018-11-20 MED ORDER — LIDOCAINE HCL (PF) 1 % IJ SOLN
INTRAMUSCULAR | Status: DC | PRN
  Administered 2018-11-20 (×2): 2 mL via INTRADERMAL

## 2018-11-20 MED ORDER — SODIUM CHLORIDE 0.9% FLUSH
3.0000 mL | Freq: Two times a day (BID) | INTRAVENOUS | Status: DC
  Administered 2018-11-20 – 2018-11-24 (×7): 3 mL via INTRAVENOUS

## 2018-11-20 MED ORDER — LABETALOL HCL 5 MG/ML IV SOLN: 10.0000 mg | INTRAVENOUS | Status: AC | PRN

## 2018-11-20 MED ORDER — SODIUM CHLORIDE 0.9% FLUSH
3.0000 mL | Freq: Two times a day (BID) | INTRAVENOUS | Status: DC
  Administered 2018-11-20: 3 mL via INTRAVENOUS

## 2018-11-20 MED ORDER — MUPIROCIN 2 % EX OINT
1.0000 "application " | TOPICAL_OINTMENT | Freq: Two times a day (BID) | CUTANEOUS | Status: DC
  Administered 2018-11-20 – 2018-11-24 (×8): 1 via NASAL
  Filled 2018-11-20 (×2): qty 22

## 2018-11-20 MED ORDER — VERAPAMIL HCL 2.5 MG/ML IV SOLN
INTRAVENOUS | Status: AC
  Filled 2018-11-20: qty 2

## 2018-11-20 MED ORDER — SODIUM CHLORIDE 0.9 % IV SOLN
INTRAVENOUS | Status: DC
  Administered 2018-11-20: 11:00:00 via INTRAVENOUS

## 2018-11-20 SURGICAL SUPPLY — 13 items
CATH 5FR JL3.5 JR4 ANG PIG MP (CATHETERS) ×1 IMPLANT
CATH BALLN WEDGE 5F 110CM (CATHETERS) ×1 IMPLANT
DEVICE RAD COMP TR BAND LRG (VASCULAR PRODUCTS) ×1 IMPLANT
GLIDESHEATH SLEND SS 6F .021 (SHEATH) ×1 IMPLANT
GUIDEWIRE .025 260CM (WIRE) ×1 IMPLANT
GUIDEWIRE INQWIRE 1.5J.035X260 (WIRE) IMPLANT
INQWIRE 1.5J .035X260CM (WIRE) ×2
KIT HEART LEFT (KITS) ×2 IMPLANT
PACK CARDIAC CATHETERIZATION (CUSTOM PROCEDURE TRAY) ×2 IMPLANT
SHEATH GLIDE SLENDER 4/5FR (SHEATH) ×1 IMPLANT
TRANSDUCER W/STOPCOCK (MISCELLANEOUS) ×2 IMPLANT
TUBING CIL FLEX 10 FLL-RA (TUBING) ×2 IMPLANT
WIRE HI TORQ VERSACORE-J 145CM (WIRE) ×1 IMPLANT

## 2018-11-20 NOTE — Progress Notes (Signed)
Physical Therapy Treatment Patient Details Name: Caitlin Oliver MRN: [id number] DOB: 1940-04-29 Today's Date: 11/20/2018    History of Present Illness 78 y.o. female with admitted with progressive SOB and CHf to [facility] on 7/30. Pt with bil pleural effusion, pulmonary HTN. PMhx: LUL lobectomy due to CA with RLL nodule presently, COPD on home O2, CHF, anxiety, PVD, GERD, HLD, HTN, CVA 3/20.    PT Comments    Pt in bed with eyes closed on arrival with O2 only in one nare. Pt easily awoken with [place] placed in both nares and pt made aware that she had not gotten up for breakfast. Pt willing to mobilize and get up to eat but once furniture arranged pt stating she wouldn't move, she attempted breakfast and realized it was cold then requested call for additional tray and to go to the bathroom. Pt perseverating on hot coffee and was able to perform mobility with guarding with noted decreased awareness for safety. Pt given coffee with transport arriving for test. Pt continually spilled coffee on herself (at least 5x) with repeated jerking of RUE but refused to allow therapist to get a lid, assist or remove coffee and pt demonstrated no intention of leaving coffee for purpose of testing despite asking about it at the beginning of session.   Pt noted to have SpO2 87% on 4L with increase to 5L to obtain 92% after seated rest with cues for breathing technique with HR 130 seated in chair. RN present and aware.  D/C plan for SNF with continued supervision and assist remains appropriate   Follow Up Recommendations  SNF;Supervision for mobility      Equipment Recommendations  None recommended by PT    Recommendations for Other Services       Precautions / Restrictions Precautions Precautions: Fall    Mobility  Bed Mobility Overal bed mobility: Modified Independent             General bed mobility comments: HOB 30 degrees with rail to pivot to EOB  Transfers Overall transfer level: Modified  independent               General transfer comment: stand from bed without assist, use of rail from low toilet  Ambulation/Gait Ambulation/Gait assistance: Min guard Gait Distance (Feet): 22 Feet Assistive device: 4-wheeled walker Gait Pattern/deviations: Step-through pattern;Decreased stride length;Trunk flexed   Gait velocity interpretation: >2.62 ft/sec, indicative of community ambulatory General Gait Details: pt walked 22' x 2 with rollator and denied further mobility until after 2nd breakfast tray arrives   Stairs             Wheelchair Mobility    Modified Rankin (Stroke Patients Only)       Balance Overall balance assessment: Needs assistance Sitting-balance support: No upper extremity supported;Feet supported Sitting balance-Leahy Scale: Good     Standing balance support: Bilateral upper extremity supported Standing balance-Leahy Scale: Poor Standing balance comment: requires UE support                            Cognition Arousal/Alertness: Awake/alert Behavior During Therapy: flat affect Overall Cognitive Status: Impaired Decreased attention, safety, problem solving Decreased safety and awareness of deficits, slow processing  pt educated for plan for session and agreeable but once initiating mobility stated she wouldn't walk until she ate breakfast but requested walking to bathroom. Pt with decreased awareness and repeatedly spilling coffee on herself with jerking and would not allow therapist to  assist or modify cup                                         Exercises      General Comments        Pertinent Vitals/Pain Pain Assessment: No/denies pain    Home Living                      Prior Function            PT Goals (current goals can now be found in the care plan section) Progress towards PT goals: Progressing toward goals    Frequency           PT Plan Current plan remains  appropriate    Co-evaluation              AM-PAC PT "6 Clicks" Mobility   Outcome Measure  Help needed turning from your back to your side while in a flat bed without using bedrails?: None Help needed moving from lying on your back to sitting on the side of a flat bed without using bedrails?: A Little Help needed moving to and from a bed to a chair (including a wheelchair)?: None Help needed standing up from a chair using your arms (e.g., wheelchair or bedside chair)?: A Little Help needed to walk in hospital room?: A Little Help needed climbing 3-5 steps with a railing? : A Little 6 Click Score: 20    End of Session Equipment Utilized During Treatment: Oxygen Activity Tolerance: Patient tolerated treatment well Patient left: in chair;with call bell/phone within reach;with chair alarm set Nurse Communication: Mobility status PT Visit Diagnosis: Unsteadiness on feet (R26.81);Muscle weakness (generalized) (M62.81);Difficulty in walking, not elsewhere classified (R26.2)     Time:  3536- 1443    Charges:    Gait training, therapeutic activity x 2                    Jalen Daluz Pam Drown, PT Acute Rehabilitation Services Pager: [phone] Office: [place] 11/20/2018, 9:56 AM

## 2018-11-20 NOTE — Progress Notes (Signed)
Received pt to room via [place] from [facility]. Tele applied. Assessment completed. Pt stated she wanted "the pros" to start IV for CT due to being stuck x3 @ [facility]. IV team consult ordered.

## 2018-11-20 NOTE — Progress Notes (Signed)
RT set up CPAP for patient and placed mask on patient. Patient states she cannot tolerate the mask due to being claustrophobic. CPAP on standby at this time.

## 2018-11-20 NOTE — Progress Notes (Signed)
Pulm Note Full note to follow She does not need another CTA This is groups 2/3 pulm HTN No symptoms of OSA Echo unchanged from 5 months ago and had CTA at that time Focus on diuresis, RHC can be considered at cardiology's discretion.

## 2018-11-20 NOTE — Care Management Important Message (Signed)
Important Message  Patient Details  Name: Caitlin Oliver MRN: [id number] Date of Birth: 01-27-1941   Medicare Important Message Given:  Yes     Shelda Altes 11/20/2018, 12:02 PM

## 2018-11-20 NOTE — Progress Notes (Signed)
Progress Note  Patient Name: Caitlin Oliver Date of Encounter: 11/20/2018  Primary Cardiologist:   Caitlin Burow, MD   Subjective   No change in clinical condition.  Still desats to the low 80s on 5 liters with ambulation.   She is having pain under the left breast to palpation.   Inpatient Medications    Scheduled Meds: . aspirin  81 mg Oral Daily  . bisacodyl  10 mg Rectal Once  . budesonide (PULMICORT) nebulizer solution  0.25 mg Nebulization BID  . calcium carbonate  1,250 mg Oral QAC supper  . Chlorhexidine Gluconate Cloth  6 each Topical Daily  . clopidogrel  75 mg Oral Daily  . furosemide  40 mg Intravenous Daily  . heparin  5,000 Units Subcutaneous Q8H  . insulin aspart  0-5 Units Subcutaneous QHS  . insulin aspart  0-9 Units Subcutaneous TID WC  . insulin glargine  8 Units Subcutaneous QHS  . levalbuterol  0.63 mg Nebulization TID  . magnesium oxide  200 mg Oral QAC supper  . mouth rinse  15 mL Mouth Rinse BID  . metoprolol tartrate  25 mg Oral BID WC  . omega-3 acid ethyl esters  1 g Oral Daily  . pantoprazole  40 mg Oral BID  . polyethylene glycol  17 g Oral BID  . pravastatin  40 mg Oral QAC supper  . sodium chloride flush  3 mL Intravenous Q12H  . traZODone  50 mg Oral QHS  . umeclidinium-vilanterol  1 puff Inhalation Daily   Continuous Infusions: . sodium chloride     PRN Meds: sodium chloride, acetaminophen, albuterol, diazepam, Glycerin (Adult), HYDROcodone-acetaminophen, meclizine, Muscle Rub, ondansetron (ZOFRAN) IV, ondansetron, sodium chloride flush   Vital Signs    Vitals:   11/20/18 0100 11/20/18 0303 11/20/18 0719 11/20/18 0722  BP: 139/74 (!) 157/69    Pulse: (!) 115     Resp: (!) 23 (!) 22    Temp:  97.8 F (36.6 C)    TempSrc:  Oral    SpO2: 97% 94% 96% 96%  Weight:  80.7 kg    Height:  _0  (1.702 m)      Intake/Output Summary (Last 24 hours) at 11/20/2018 0727 Last data filed at 11/20/2018 0400 Gross per 24 hour  Intake 235 ml   Output 900 ml  Net -665 ml   Filed Weights   11/17/18 0531 11/18/18 1200 11/20/18 0303  Weight: 82.5 kg 80.2 kg 80.7 kg    Telemetry    NSR, sinus tach - Personally Reviewed  ECG    NA - Personally Reviewed  Physical Exam   GEN: No acute distress.   Neck:   Positive  JVD Cardiac: RRR, no murmurs, rubs, or gallops.  Respiratory:   Decreased breath sounds GI: Soft, nontender, non-distended  MS:    Mild/mod bilateral edema; No deformity. Neuro:  Nonfocal, slightly somnolent Psych: Normal affect   Labs    Chemistry Recent Labs  Lab 11/17/18 0449 11/18/18 0431 11/19/18 0222  NA 138 139 139  K 3.9 4.2 4.2  CL 89* 89* 91*  CO2 37* 40* 42*  GLUCOSE 210* 146* 141*  BUN _1 CREATININE 0.90 0.87 0.75  CALCIUM 9.0 9.3 9.1  PROT 6.5 7.0 6.3*  ALBUMIN 3.6 3.9 3.5  AST 34 34 44*  ALT _2 ALKPHOS 44 50 47  BILITOT 0.4 0.7 0.5  GFRNONAA >60 >60 >60  GFRAA >60 >60 >60  ANIONGAP 12  10 6     Hematology Recent Labs  Lab 11/17/18 0449 11/18/18 0431 11/19/18 0222  WBC 5.7 6.4 6.4  RBC 4.33 4.41 4.26  HGB 11.1* 11.8* 11.2*  HCT 39.7 40.4 39.2  MCV 91.7 91.6 92.0  MCH 25.6* 26.8 26.3  MCHC 28.0* 29.2* 28.6*  RDW 13.6 13.5 13.6  PLT 237 235 223    Cardiac EnzymesNo results for input(s): TROPONINI in the last 168 hours. No results for input(s): TROPIPOC in the last 168 hours.   BNP Recent Labs  Lab 11/13/18 1434 11/14/18 0431 11/18/18 0431  BNP  --  528.4* 422.3*  PROBNP 1,147.0*  --   --      DDimer  Recent Labs  Lab 11/19/18 2015  DDIMER 0.64*     Radiology    Dg Chest Port 1 View  Result Date: 11/19/2018 CLINICAL DATA:  Shortness of breath EXAM: PORTABLE CHEST 1 VIEW COMPARISON:  11/13/2018 FINDINGS: Stable cardiomediastinal contours. Calcific aortic knob. Small bilateral pleural effusions, stable on the left, decreased on the right. Mild streaky left basilar opacity. No pneumothorax. IMPRESSION: 1. Small bilateral pleural effusions,  slightly decreased on the right. 2. Mild streaky left basilar opacity suggesting atelectasis. Electronically Signed   By: Caitlin Oliver M.D.   On: 11/19/2018 09:55    Cardiac Studies   Cardiac Cath:   1. The left ventricle has hyperdynamic systolic function, with an ejection fraction of >65%. The cavity size was normal. There is moderate asymmetric left ventricular hypertrophy of the septal wall. Left ventricular diastolic Doppler parameters are  indeterminate.  2. The right ventricle has mildly reduced systolic function. The cavity was severely enlarged. There is no increase in right ventricular wall thickness. Right ventricular systolic pressure is severely elevated with an estimated pressure of 65.8 mmHg.  3. Small pericardial effusion.  4. No stenosis of the aortic valve.  5. The aorta is normal in size and structure.  6. The aortic root is normal in size and structure.  7. The inferior vena cava was dilated in size with >50% respiratory variability.  Patient Profile     78 y.o. female with a hx of significant PVD including high grade bilateral carotid artery disease s/p staged right followed by left carotid endarterectomy performed by Dr.Brabham in 2014, left subclavian stenting in 2017, LE arterial disease s/p bilateral common iliac rotational atherectomy PTA and stenting in 2015, HTN, HLD, DM, tobacco abuse, COPD, lung cancer (stage IA squamous cell carcinoma) s/p left upper lobectomy in 2017, chronic home O2 (2L/min baseline), CVA, h/o GIB w/ Plavix and HFpEF, who is being seen for the evaluation of acute CHF at the request of Dr. Nevada Oliver, Internal Medicine.     Assessment & Plan    Acute on chronic diastolic HF:  Negative about 7 liters since admission but incomplete I/O with her transfer.  Will continue with diuresis.  Needs right and left heart cath.  Left heart given mildly increased hs Trop.  The patient understands that risks included but are not limited to stroke (1 in 1000),  death (1 in 54), kidney failure [usually temporary] (1 in 500), bleeding (1 in 200), allergic reaction [possibly serious] (1 in 200).  The patient understands and agrees to proceed.   HTN:  Norvasc was held secondary to low BPs that precluded diuresis.  BP is up last several readings.  OK to allow HTN and pushing her BP.    RV Failure:   Pulmonary HTN likely secondary.   See above.  For questions or updates, please contact [place] Please consult [email or web address] for contact info under Cardiology/STEMI.   Signed, Minus Breeding, MD  11/20/2018, 7:27 AM

## 2018-11-20 NOTE — Plan of Care (Signed)
  Problem: Health Behavior/Discharge Planning: Goal: Ability to manage health-related needs will improve Outcome: Progressing   Problem: Clinical Measurements: Goal: Ability to maintain clinical measurements within normal limits will improve Outcome: Progressing Goal: Will remain free from infection Outcome: Progressing Goal: Diagnostic test results will improve Outcome: Progressing Goal: Respiratory complications will improve Outcome: Progressing Goal: Cardiovascular complication will be avoided Outcome: Progressing   Problem: Activity: Goal: Risk for activity intolerance will decrease Outcome: Progressing   Problem: Elimination: Goal: Will not experience complications related to bowel motility Outcome: Progressing   Problem: Safety: Goal: Ability to remain free from injury will improve Outcome: Progressing   Problem: Education: Goal: Ability to demonstrate management of disease process will improve Outcome: Progressing Goal: Ability to verbalize understanding of medication therapies will improve Outcome: Progressing Goal: Individualized Educational Video(s) Outcome: Progressing   Problem: Activity: Goal: Capacity to carry out activities will improve Outcome: Progressing   Problem: Cardiac: Goal: Ability to achieve and maintain adequate cardiopulmonary perfusion will improve Outcome: Progressing

## 2018-11-20 NOTE — Interval H&P Note (Signed)
History and Physical Interval Note:  11/20/2018 2:59 PM  Caitlin Oliver  has presented today for cardiac catheterization, with the diagnosis of unstable angina, acute on chronic HFpEF.  The various methods of treatment have been discussed with the patient and family. After consideration of risks, benefits and other options for treatment, the patient has consented to  Procedure(s): RIGHT/LEFT HEART CATH AND CORONARY ANGIOGRAPHY (N/A) as a surgical intervention.  The patient's history has been reviewed, patient examined, no change in status, stable for surgery.  I have reviewed the patient's chart and labs.  Questions were answered to the patient's satisfaction.    Cath Lab Visit (complete for each Cath Lab visit)  Clinical Evaluation Leading to the Procedure:   ACS: Yes.    Non-ACS:  N/A  [name]

## 2018-11-20 NOTE — H&P (View-Only) (Signed)
Progress Note  Patient Name: Caitlin Oliver Date of Encounter: 11/20/2018  Primary Cardiologist:   Quay Burow, MD   Subjective   No change in clinical condition.  Still desats to the low 80s on 5 liters with ambulation.   She is having pain under the left breast to palpation.   Inpatient Medications    Scheduled Meds: . aspirin  81 mg Oral Daily  . bisacodyl  10 mg Rectal Once  . budesonide (PULMICORT) nebulizer solution  0.25 mg Nebulization BID  . calcium carbonate  1,250 mg Oral QAC supper  . Chlorhexidine Gluconate Cloth  6 each Topical Daily  . clopidogrel  75 mg Oral Daily  . furosemide  40 mg Intravenous Daily  . heparin  5,000 Units Subcutaneous Q8H  . insulin aspart  0-5 Units Subcutaneous QHS  . insulin aspart  0-9 Units Subcutaneous TID WC  . insulin glargine  8 Units Subcutaneous QHS  . levalbuterol  0.63 mg Nebulization TID  . magnesium oxide  200 mg Oral QAC supper  . mouth rinse  15 mL Mouth Rinse BID  . metoprolol tartrate  25 mg Oral BID WC  . omega-3 acid ethyl esters  1 g Oral Daily  . pantoprazole  40 mg Oral BID  . polyethylene glycol  17 g Oral BID  . pravastatin  40 mg Oral QAC supper  . sodium chloride flush  3 mL Intravenous Q12H  . traZODone  50 mg Oral QHS  . umeclidinium-vilanterol  1 puff Inhalation Daily   Continuous Infusions: . sodium chloride     PRN Meds: sodium chloride, acetaminophen, albuterol, diazepam, Glycerin (Adult), HYDROcodone-acetaminophen, meclizine, Muscle Rub, ondansetron (ZOFRAN) IV, ondansetron, sodium chloride flush   Vital Signs    Vitals:   11/20/18 0100 11/20/18 0303 11/20/18 0719 11/20/18 0722  BP: 139/74 (!) 157/69    Pulse: (!) 115     Resp: (!) 23 (!) 22    Temp:  97.8 F (36.6 C)    TempSrc:  Oral    SpO2: 97% 94% 96% 96%  Weight:  80.7 kg    Height:  _0  (1.702 m)      Intake/Output Summary (Last 24 hours) at 11/20/2018 0727 Last data filed at 11/20/2018 0400 Gross per 24 hour  Intake 235 ml   Output 900 ml  Net -665 ml   Filed Weights   11/17/18 0531 11/18/18 1200 11/20/18 0303  Weight: 82.5 kg 80.2 kg 80.7 kg    Telemetry    NSR, sinus tach - Personally Reviewed  ECG    NA - Personally Reviewed  Physical Exam   GEN: No acute distress.   Neck:   Positive  JVD Cardiac: RRR, no murmurs, rubs, or gallops.  Respiratory:   Decreased breath sounds GI: Soft, nontender, non-distended  MS:    Mild/mod bilateral edema; No deformity. Neuro:  Nonfocal, slightly somnolent Psych: Normal affect   Labs    Chemistry Recent Labs  Lab 11/17/18 0449 11/18/18 0431 11/19/18 0222  NA 138 139 139  K 3.9 4.2 4.2  CL 89* 89* 91*  CO2 37* 40* 42*  GLUCOSE 210* 146* 141*  BUN _1 CREATININE 0.90 0.87 0.75  CALCIUM 9.0 9.3 9.1  PROT 6.5 7.0 6.3*  ALBUMIN 3.6 3.9 3.5  AST 34 34 44*  ALT _2 ALKPHOS 44 50 47  BILITOT 0.4 0.7 0.5  GFRNONAA >60 >60 >60  GFRAA >60 >60 >60  ANIONGAP 12  10 6     Hematology Recent Labs  Lab 11/17/18 0449 11/18/18 0431 11/19/18 0222  WBC 5.7 6.4 6.4  RBC 4.33 4.41 4.26  HGB 11.1* 11.8* 11.2*  HCT 39.7 40.4 39.2  MCV 91.7 91.6 92.0  MCH 25.6* 26.8 26.3  MCHC 28.0* 29.2* 28.6*  RDW 13.6 13.5 13.6  PLT 237 235 223    Cardiac EnzymesNo results for input(s): TROPONINI in the last 168 hours. No results for input(s): TROPIPOC in the last 168 hours.   BNP Recent Labs  Lab 11/13/18 1434 11/14/18 0431 11/18/18 0431  BNP  --  528.4* 422.3*  PROBNP 1,147.0*  --   --      DDimer  Recent Labs  Lab 11/19/18 2015  DDIMER 0.64*     Radiology    Dg Chest Port 1 View  Result Date: 11/19/2018 CLINICAL DATA:  Shortness of breath EXAM: PORTABLE CHEST 1 VIEW COMPARISON:  11/13/2018 FINDINGS: Stable cardiomediastinal contours. Calcific aortic knob. Small bilateral pleural effusions, stable on the left, decreased on the right. Mild streaky left basilar opacity. No pneumothorax. IMPRESSION: 1. Small bilateral pleural effusions,  slightly decreased on the right. 2. Mild streaky left basilar opacity suggesting atelectasis. Electronically Signed   By: Davina Poke M.D.   On: 11/19/2018 09:55    Cardiac Studies   Cardiac Cath:   1. The left ventricle has hyperdynamic systolic function, with an ejection fraction of >65%. The cavity size was normal. There is moderate asymmetric left ventricular hypertrophy of the septal wall. Left ventricular diastolic Doppler parameters are  indeterminate.  2. The right ventricle has mildly reduced systolic function. The cavity was severely enlarged. There is no increase in right ventricular wall thickness. Right ventricular systolic pressure is severely elevated with an estimated pressure of 65.8 mmHg.  3. Small pericardial effusion.  4. No stenosis of the aortic valve.  5. The aorta is normal in size and structure.  6. The aortic root is normal in size and structure.  7. The inferior vena cava was dilated in size with >50% respiratory variability.  Patient Profile     78 y.o. female with a hx of significant PVD including high grade bilateral carotid artery disease s/p staged right followed by left carotid endarterectomy performed by Dr.Brabham in 2014, left subclavian stenting in 2017, LE arterial disease s/p bilateral common iliac rotational atherectomy PTA and stenting in 2015, HTN, HLD, DM, tobacco abuse, COPD, lung cancer (stage IA squamous cell carcinoma) s/p left upper lobectomy in 2017, chronic home O2 (2L/min baseline), CVA, h/o GIB w/ Plavix and HFpEF, who is being seen for the evaluation of acute CHF at the request of Dr. Nevada Crane, Internal Medicine.     Assessment & Plan    Acute on chronic diastolic HF:  Negative about 7 liters since admission but incomplete I/O with her transfer.  Will continue with diuresis.  Needs right and left heart cath.  Left heart given mildly increased hs Trop.  The patient understands that risks included but are not limited to stroke (1 in 1000),  death (1 in 54), kidney failure [usually temporary] (1 in 500), bleeding (1 in 200), allergic reaction [possibly serious] (1 in 200).  The patient understands and agrees to proceed.   HTN:  Norvasc was held secondary to low BPs that precluded diuresis.  BP is up last several readings.  OK to allow HTN and pushing her BP.    RV Failure:   Pulmonary HTN likely secondary.   See above.  For questions or updates, please contact [place] Please consult [email or web address] for contact info under Cardiology/STEMI.   Signed, Minus Breeding, MD  11/20/2018, 7:27 AM

## 2018-11-20 NOTE — Progress Notes (Signed)
Caitlin Oliver:[id number] DOB: 02-Nov-1940 DOA: 11/13/2018 PCP: [name], PA-C  Brief History   The patient is a 78 yr old woman who presented to [facility] ED with complaints of increased shortness of breath and leg swelling. She carries a medical history significant for advanced COPD for which she is followed by pulmonology, diastolic dysfunction CHF with an EF of 60% on an echocardiogram performed of March of this year. She also has anxiety disorder, peripheral vascular disease, diabetes, GERD, hyperlipidemia, hypertension, CAD, lung cancer, and CVA in March. She has required 2L of O2 since March. She has had increased shortness of breath with PND and orthopnea, and leg swelling for the last 2 weeks. She saw her PCP and her lasix was increased from 20 mg to 40 mg 2 days prior to presentation. She experienced increased urinary output, but her dyspnea continued to worsen. She presented to the ED with evidence of volume overload. She was admitted with diagnosis of acute exacerbation of diastolic CHF. She was requiring 3L O2 to maintain oxygenation in the low to mid 90's.  The patient was admitted to a telemetry bed. She was diuresed and cardiology was consulted. The patient was hypotensive and was therefore given IV fluids. She also demonstrated volume contraction. She was placed on an insulin sliding scale for her diabetes, and she was continued on her home medications for her anxiety and depression.  On the evening of 11/19/2018, the patient had an exacerbation of her dyspnea as she returned to her bed from the toilet. This was followed by pain in her left chest. EKG was performed and was negative for ischemic changes. Troponins were flat. She was given Hydrocodone for pain and metoprolol for elevated heart rate and blood pressures. She was transferred to [facility] from [place].  Cardiology is considering RHC to further investigate her elevated ventricular systolic pressures and  her volume status. Echocardiogram 11/14/2018 demosntrated EF >65% with evidence of severe pulmonary hypertension. Pulmonology was consulted.  Consultants  . Cardiology . Pulmonary Critical Care  Procedures  . None  Antibiotics   Anti-infectives (From admission, onward)   Start     Dose/Rate Route Frequency Ordered Stop   11/14/18 1200  cephALEXin (KEFLEX) capsule 500 mg     500 mg Oral Every 6 hours 11/14/18 1116 11/19/18 0604    .   Subjective  The patient is awake, alert, and oriented x 3. No acute distress.  Objective   Vitals:  Vitals:   11/20/18 1553 11/20/18 1557  BP: (!) 147/89   Pulse: 100 (!) 0  Resp: (!) 29 (!) 41  Temp:    SpO2: (!) 0% (!) 0%    Exam:  Constitutional:  . The patient is awake, alert, and oriented x 3. No acute distress. Respiratory:  . No increased work of breathing. . No wheezes or rhonchi. Positive for diffuse rales. . No tactile fremitus Cardiovascular:  . Regular rate and rhythm . No murmurs, ectopy, or gallups  . No lateral PMI. No thrills. Abdomen:  . Abdomen is soft, non-tender, non-distended . No hernias, masses, or organomegaly . Normoactive bowel sounds. Musculoskeletal:  . No cyanosis or clubbing. [name] Positive for 2+ edema bilaterally. Skin:  . No rashes, lesions, ulcers . palpation of skin: no induration or nodules Neurologic:  . CN 2-12 intact . Sensation all 4 extremities intact Psychiatric:  . Mental status o Mood, affect appropriate o Orientation to person, place, time  . judgment and insight  appear intact   I have personally reviewed the following:   Today's Data  . Vitals  Scheduled Meds: . aspirin  81 mg Oral Daily  . bisacodyl  10 mg Rectal Once  . budesonide (PULMICORT) nebulizer solution  0.25 mg Nebulization BID  . calcium carbonate  1,250 mg Oral QAC supper  . Chlorhexidine Gluconate Cloth  6 each Topical Daily  . clopidogrel  75 mg Oral Daily  . furosemide  40 mg Intravenous Q12H  . heparin   5,000 Units Subcutaneous Q8H  . insulin aspart  0-5 Units Subcutaneous QHS  . insulin aspart  0-9 Units Subcutaneous TID WC  . insulin glargine  8 Units Subcutaneous QHS  . levalbuterol  0.63 mg Nebulization TID  . magnesium oxide  200 mg Oral QAC supper  . mouth rinse  15 mL Mouth Rinse BID  . metoprolol tartrate  25 mg Oral BID WC  . omega-3 acid ethyl esters  1 g Oral Daily  . pantoprazole  40 mg Oral BID  . polyethylene glycol  17 g Oral BID  . pravastatin  40 mg Oral QAC supper  . sodium chloride flush  3 mL Intravenous Q12H  . traZODone  50 mg Oral QHS  . umeclidinium-vilanterol  1 puff Inhalation Daily   Continuous Infusions: . sodium chloride      Principal Problem:   Acute on chronic combined systolic and diastolic CHF (congestive heart failure) (HCC) Active Problems:   Essential hypertension   Type 2 diabetes mellitus (HCC)   PVD (peripheral vascular disease) with claudication (HCC)   Carotid artery obstruction   COPD GOLD II   Cancer of left lung (HCC)   Acute on chronic heart failure with preserved ejection fraction (HFpEF) ([place])   Unstable angina ([place])   LOS: 7 days   A & P   Acute on chronic diastolic CHF/pulmonary hypertension: Presented with a BNP greater than 400, last 2D echo done on 11/14/2018 showed preserved LVEF greater than 65% with right ventricular systolic pressure severely elevated 65.  Diuresing stopped due to hypotension, although net I&O -6.5 L since admission. Continue strict I's and O's and daily weight.  Peripheral vascular disease with chronic bilateral lower extremity tenderness with  self-reported tenderness with palpation of lower extremities bilaterally:  Chronic.Continue aspirin, Plavix and atorvastatin.  Contraction alkalosis: Bicarb 42 on 11/19/2018. Diuretics held due to hypotension. As a matter of fact she was given IV fluids due to hypotension. Continue to follow electrolytes.  Acute on chronic hypoxic respiratory failure in the  setting of COPD: On 2 L of oxygen continuously at baseline. She is currently requiring 4-5 liters to maintain saturations in the mid-90's. Continue nebulizers. No CTA necessary per pulmonology.  Resolved hypotension: Resolved with holding diuretics and the administration of volume and albumin. Cardizem and metoprolol have been restarted at reduced doses.  Left lower extremity cellulitis: The patient has completed 5 days of Keflex. The erythema and warmth appeared resolved. No fever or leukocytosis.  Type 2 diabetes: Continue insulin sliding scale. Last hemoglobin A1c 7.9 on 06/17/2018.  Chronic anxiety/depression: Continue home psych medications.  GERD: Continue PPI  I have seen and examined this patient myself. I have spent 35 minutes in her evaluation and care.   DVT prophylaxis:heparin subcu 3 times daily Code Status:full  Family Communication: None available Disposition Plan: Possible DC to home tomorrow or when cardiology signs off.  Zakiyah Diop, DO [facility] Direct contact: see [email or web address]  7PM-7AM contact night coverage as above  11/20/2018, 4:29 PM  LOS: 7 days

## 2018-11-20 NOTE — Consult Note (Signed)
NAME:  Caitlin Oliver, MRN:  [id number], DOB:  03-07-1941, LOS: 7 ADMISSION DATE:  11/13/2018, CONSULTATION DATE:  11/20/18 REFERRING MD:  Dr. Nevada Crane, CHIEF COMPLAINT:  SOB   Brief History   78 year old woman with hx of heart and lung disease presenting with multifactorial pulmonary hypertension.  History of present illness   78 year old woman with O2-dependent COPD, HFPEF, PVD, DM, GERD, CVA presenting with worsening leg swelling and dry cough.  Workup revealed pulmonary edema. Echo showing pulmonary HTN unchanged from March 2020.  On ROS, she has some shoulder and joint pains. RE: OSA, sleep is restorative, does not fall asleep during day, no night-time awakenings. Does have AM headaches.  No personal or family history of rheumatologic diseases or sarcoid.  Denies aspiration symptoms.  Patient already follows with Dr. Melvyn Novas for her COPD, on triple therapy.  PFT 2017 show fairly preserved lung function but this was before her LULobectomy for 1a squamous cell.  Past Medical History  CVA COPD Anxiety/depression Chronic pain Peripheral vascular disease HFPEF Lung cancer in remission Carotid artery disease s/p bilateral endarterectomies HLD Tobacco abuse Prior C diff  Significant Hospital Events   8/5 transferred to [facility] for potential RHC  Consults:  Pulm, Card  Procedures:  NA  Significant Diagnostic Tests:  CTA chest 06/17/18, no PE, +PAH changes, lobectomy changes, stable RLL lung nodule  Micro Data:  NA  Antimicrobials:  NA   Interim history/subjective:  Anxious this AM.  Mad that she was placed in chair.  Has headache.  Objective   Blood pressure (!) 160/84, pulse (!) 117, temperature 97.8 F (36.6 C), temperature source Oral, resp. rate (!) 22, height _0  (1.702 m), weight 80.7 kg, SpO2 95 %.        Intake/Output Summary (Last 24 hours) at 11/20/2018 0940 Last data filed at 11/20/2018 0400 Gross per 24 hour  Intake 235 ml  Output 900 ml  Net -665 ml   Filed Weights     11/17/18 0531 11/18/18 1200 11/20/18 0303  Weight: 82.5 kg 80.2 kg 80.7 kg    Examination: GEN: frail elderly woman in NAD HEENT: MMM, no thrush  CV: Tachycardic, ext warm PULM: No wheezing, diminished breath sounds bases  GI: Soft, +BS EXT: 1+ pitting edema, no clubbing NEURO: Moves all 4 ext to command PSYCH: AOx3, good insight SKIN: Scattered bruising and thin skin   Resolved Hospital Problem list   NA  Assessment & Plan:  Groups 2/3 pulmonary hypertension due to combination of following: (1) advanced COPD with chronic CO2 retention, no evidence of acute flare (2) chronic hypoxemia (3) HFPEF (4) Prior lobectomy (5) possible undiagnosed sleep disordered breathing  - No signs of group 1 disease by history.  Will send ANA/RF screens and should be enough.  - Would diurese until rise in Cr.  - RHC per cardiology.  - Trial of NIV tonight, if tolerates will work on getting her one for home.  - Continue breathing treatments as ordered  Labs   CBC: Recent Labs  Lab 11/13/18 1434  11/14/18 0431 11/15/18 0610 11/16/18 0545 11/17/18 0449 11/18/18 0431 11/19/18 0222  WBC 7.4   < > 7.6 7.3 8.3 5.7 6.4 6.4  NEUTROABS 6.0  --  6.0  --   --   --   --   --   HGB 11.9*   < > 12.1 11.4* 12.3 11.1* 11.8* 11.2*  HCT 38.6   < > 41.7 40.5 42.6 39.7 40.4 39.2  MCV 85.3   < > 90.1 91.8 91.6 91.7 91.6 92.0  PLT 271.0   < > 239 254 275 237 235 223   < > = values in this interval not displayed.    Basic Metabolic Panel: Recent Labs  Lab 11/15/18 0610 11/16/18 0545 11/17/18 0449 11/18/18 0431 11/19/18 0222  NA 139 140 138 139 139  K 3.6 3.8 3.9 4.2 4.2  CL 91* 89* 89* 89* 91*  CO2 38* 39* 37* 40* 42*  GLUCOSE 154* 126* 210* 146* 141*  BUN _0 CREATININE 0.76 0.85 0.90 0.87 0.75  CALCIUM 8.8* 9.3 9.0 9.3 9.1  MG 1.7 2.0 2.1 2.1 2.2   GFR: Estimated Creatinine Clearance: 63.3 mL/min (by C-G formula based on SCr of 0.75 mg/dL). Recent Labs  Lab  11/16/18 0545 11/17/18 0449 11/18/18 0431 11/19/18 0222  WBC 8.3 5.7 6.4 6.4    Liver Function Tests: Recent Labs  Lab 11/15/18 0610 11/16/18 0545 11/17/18 0449 11/18/18 0431 11/19/18 0222  AST 20 20 34 34 44*  ALT _1 ALKPHOS 42 45 44 50 47  BILITOT 0.5 0.5 0.4 0.7 0.5  PROT 6.4* 7.0 6.5 7.0 6.3*  ALBUMIN 3.6 3.9 3.6 3.9 3.5   No results for input(s): LIPASE, AMYLASE in the last 168 hours. No results for input(s): AMMONIA in the last 168 hours.  ABG    Component Value Date/Time   PHART 7.326 (L) 06/20/2018 0454   PCO2ART 58.6 (H) 06/20/2018 0454   PO2ART 363 (H) 06/20/2018 0454   HCO3 29.8 (H) 06/20/2018 0454   TCO2 27 11/15/2015 0811   ACIDBASEDEF 0.5 02/01/2016 1400   O2SAT 99.8 06/20/2018 0454     Coagulation Profile: No results for input(s): INR, PROTIME in the last 168 hours.  Cardiac Enzymes: No results for input(s): CKTOTAL, CKMB, CKMBINDEX, TROPONINI in the last 168 hours.  HbA1C: Hgb A1c MFr Bld  Date/Time Value Ref Range Status  06/17/2018 04:30 AM 7.9 (H) 4.8 - 5.6 % Final    Comment:    (NOTE) Pre diabetes:          5.7%-6.4% Diabetes:              >6.4% Glycemic control for   <7.0% adults with diabetes   06/03/2017 02:14 PM 8.1 (H) 4.8 - 5.6 % Final    Comment:             Prediabetes: 5.7 - 6.4          Diabetes: >6.4          Glycemic control for adults with diabetes: <7.0     CBG: Recent Labs  Lab 11/19/18 0735 11/19/18 1214 11/19/18 1543 11/19/18 2213 11/20/18 0624  GLUCAP 121* 182* 166* 146* 146*    Review of Systems:    Positive Symptoms in bold:  Constitutional fevers, chills, weight loss, fatigue, anorexia, malaise  Eyes decreased vision, double vision, eye irritation  Ears, Nose, Mouth, Throat sore throat, trouble swallowing, sinus congestion  Cardiovascular chest pain, paroxysmal nocturnal dyspnea, lower ext edema, palpitations   Respiratory SOB, cough, DOE, hemoptysis, wheezing  Gastrointestinal  nausea, vomiting, diarrhea  Genitourinary burning with urination, trouble urinating  Musculoskeletal joint aches, joint swelling, back pain  Integumentary  rashes, skin lesions  Neurological focal weakness, focal numbness, trouble speaking, headaches  Psychiatric depression, anxiety, confusion  Endocrine polyuria, polydipsia, cold intolerance, heat intolerance  Hematologic abnormal bruising, abnormal bleeding, unexplained nose bleeds  Allergic/Immunologic recurrent  infections, hives, swollen lymph nodes     Past Medical History  She,  has a past medical history of Anxiety, Carotid artery disease ([place]), Claudication ([place]), COPD (chronic obstructive pulmonary disease) ([place]), Diabetes mellitus, Dizzy, GERD (gastroesophageal reflux disease), History of kidney stones, Hypercholesteremia, Hypertension, Peripheral arterial disease ([place]), Stomach ulcer, and Tobacco abuse.   Surgical History    Past Surgical History:  Procedure Laterality Date   ABDOMINAL HYSTERECTOMY     APPENDECTOMY     BREAST REDUCTION SURGERY     CAROTID ANGIOGRAM N/A 02/25/2013   Procedure: CAROTID ANGIOGRAM;  Surgeon: Lorretta Harp, MD;  Location: [facility] CATH LAB;  Service: Cardiovascular;  Laterality: N/A;   ENDARTERECTOMY Right 03/06/2013   Procedure: ENDARTERECTOMY CAROTID-RIGHT;  Surgeon: Serafina Mitchell, MD;  Location: [place];  Service: Vascular;  Laterality: Right;   ENDARTERECTOMY Left 05/07/2013   Procedure: LEFT CAROTID ARTERY ENDARTERECTOMY WITH VASCU-GUARD PATCH ANGIOPLASTY ;  Surgeon: Serafina Mitchell, MD;  Location: [place];  Service: Vascular;  Laterality: Left;   LOBECTOMY Left 02/03/2016   Procedure: LEFT UPPER LOBECTOMY;  Surgeon: [name], MD;  Location: [place];  Service: Thoracic;  Laterality: Left;   LOWER EXTREMITY ANGIOGRAM N/A 02/25/2013   Procedure: LOWER EXTREMITY ANGIOGRAM;  Surgeon: Lorretta Harp, MD;  Location: [facility] CATH LAB;  Service: Cardiovascular;  Laterality: N/A;   LOWER  EXTREMITY ANGIOGRAM N/A 07/27/2013   Procedure: LOWER EXTREMITY ANGIOGRAM;  Surgeon: Lorretta Harp, MD;  Location: [facility] CATH LAB;  Service: Cardiovascular;  Laterality: N/A;   PATCH ANGIOPLASTY Right 03/06/2013   Procedure: PATCH ANGIOPLASTY of Right Carotid Artery using Vascu-Guard Patch;  Surgeon: Serafina Mitchell, MD;  Location: [place];  Service: Vascular;  Laterality: Right;   PERIPHERAL VASCULAR CATHETERIZATION N/A 11/15/2015   Procedure: Aortic Arch Angiography;  Surgeon: Serafina Mitchell, MD;  Location: [place] CV LAB;  Service: Cardiovascular;  Laterality: N/A;   PERIPHERAL VASCULAR CATHETERIZATION Bilateral 11/15/2015   Procedure: Carotid Angiography;  Surgeon: Serafina Mitchell, MD;  Location: [place] CV LAB;  Service: Cardiovascular;  Laterality: Bilateral;   PERIPHERAL VASCULAR CATHETERIZATION Left 11/15/2015   Procedure: Upper Extremity Angiography;  Surgeon: Serafina Mitchell, MD;  Location: [place] CV LAB;  Service: Cardiovascular;  Laterality: Left;   PERIPHERAL VASCULAR CATHETERIZATION Left 11/15/2015   Procedure: Peripheral Vascular Intervention;  Surgeon: Serafina Mitchell, MD;  Location: [place] CV LAB;  Service: Cardiovascular;  Laterality: Left;  subclavian    SALIVARY GLAND SURGERY     scar tissue removed from left saliva glad   TUBAL LIGATION     VIDEO ASSISTED THORACOSCOPY (VATS)/WEDGE RESECTION Left 02/03/2016   Procedure: VIDEO ASSISTED THORACOSCOPY;  Surgeon: [name], MD;  Location: [place];  Service: Thoracic;  Laterality: Left;     Social History   reports that she quit smoking about 5 years ago. Her smoking use included cigarettes. She has a 84.00 pack-year smoking history. She quit smokeless tobacco use about 5 years ago. She reports that she does not drink alcohol or use drugs.   Family History   Her family history includes Hypertension in her mother; Kidney failure in her father; Stroke in her mother.   Allergies Allergies  Allergen  Reactions   No Known Allergies Other (See Comments)     Home Medications  Prior to Admission medications   Medication Sig Start Date End Date Taking? Authorizing Provider  albuterol (PROVENTIL HFA;VENTOLIN HFA) 108 (90 Base) MCG/ACT inhaler Inhale 2 puffs into  the lungs every 6 (six) hours as needed for wheezing or shortness of breath.  07/11/17  Yes [provider]  aspirin 81 MG chewable tablet Chew by mouth daily.   Yes [provider]  BIOTIN 5000 PO Take 5,000 mg by mouth daily before supper.    Yes [provider]  calcium carbonate (CALCIUM 600) 600 MG TABS tablet Take 600 mg by mouth daily before supper.    Yes [provider]  clopidogrel (PLAVIX) 75 MG tablet Take 1 tablet (75 mg total) by mouth daily. 06/20/18  Yes Thurnell Lose, MD  diazepam (VALIUM) 2 MG tablet Take 5 mg by mouth every 8 (eight) hours as needed for anxiety.    Yes [provider]  diltiazem (DILACOR XR) 180 MG 24 hr capsule Take 180 mg by mouth daily.  07/16/16  Yes [provider]  furosemide (LASIX) 20 MG tablet Take 20-40 mg by mouth daily.    Yes [provider]  HYDROcodone-acetaminophen (NORCO/VICODIN) 5-325 MG tablet Take 1 tablet by mouth every 12 (twelve) hours as needed for moderate pain. 06/20/18  Yes Thurnell Lose, MD  lisinopril (ZESTRIL) 10 MG tablet Take 10 mg by mouth daily.   Yes [provider]  MAGNESIUM PO Take 1 tablet by mouth daily before supper.   Yes [provider]  meclizine (ANTIVERT) 12.5 MG tablet Take 12.5 mg by mouth 3 (three) times daily as needed for dizziness or nausea.  09/30/18  Yes [provider]  Menthol, Topical Analgesic, (ICY HOT EX) Apply 1 spray topically every 8 (eight) hours as needed (pain).   Yes [provider]  metFORMIN (GLUCOPHAGE) 500 MG tablet Take by mouth 2 (two) times daily with a meal.   Yes [provider]  metoprolol tartrate (LOPRESSOR) 50 MG  tablet Take 1 tablet (50 mg total) by mouth 2 (two) times daily with a meal. 06/20/18  Yes Thurnell Lose, MD  Omega-3 Fatty Acids (FISH OIL PO) Take 1 capsule by mouth daily.   Yes [provider]  ondansetron (ZOFRAN) 4 MG tablet Take 4 mg by mouth at bedtime as needed for nausea or vomiting.  06/05/18  Yes [provider]  pantoprazole (PROTONIX) 40 MG tablet Take 40 mg by mouth 2 (two) times daily.  12/17/13  Yes [provider]  pravastatin (PRAVACHOL) 40 MG tablet Take 40 mg by mouth daily before supper.    Yes [provider]  traZODone (DESYREL) 50 MG tablet Take 50 mg by mouth at bedtime.   Yes [provider]  albuterol (PROVENTIL) (2.5 MG/3ML) 0.083% nebulizer solution Take 3 mLs (2.5 mg total) by nebulization every 6 (six) hours as needed for wheezing or shortness of breath. 11/19/18   Martyn Ehrich, NP  Fluticasone-Umeclidin-Vilant (TRELEGY ELLIPTA) 100-62.5-25 MCG/INH AEPB Inhale 1 puff into the lungs daily. 11/13/18   Martyn Ehrich, NP  insulin aspart (NOVOLOG) 100 UNIT/ML injection Substitute to any brand approved.Before each meal 3 times a day, 140-199 - 2 units, 200-250 - 4 units, 251-299 - 6 units,  300-349 - 8 units,  350 or above 10 units. Dispense syringes and needles as needed, Ok to switch to PEN if approved. DX DM2, Code E11.65 Patient not taking: Reported on 11/13/2018 06/20/18   Thurnell Lose, MD  insulin glargine (LANTUS) 100 UNIT/ML injection Inject 0.08 mLs (8 Units total) into the skin at bedtime. Dispense insulin pen if approved, if not dispense as needed syringes and needles  for 1 month supply. Can switch to Levemir. Diagnosis E 11.65. Patient not taking: Reported on 10/03/2018 06/20/18   Thurnell Lose, MD  ONE [facility] ULTRA TEST test strip  09/12/16   [provider]

## 2018-11-21 ENCOUNTER — Encounter (HOSPITAL_COMMUNITY): Payer: Self-pay | Admitting: Internal Medicine

## 2018-11-21 LAB — CBC WITH DIFFERENTIAL/PLATELET
Abs Immature Granulocytes: 0.04 10*3/uL (ref 0.00–0.07)
Basophils Absolute: 0 10*3/uL (ref 0.0–0.1)
Basophils Relative: 1 %
Eosinophils Absolute: 0.4 10*3/uL (ref 0.0–0.5)
Eosinophils Relative: 6 %
HCT: 37.7 % (ref 36.0–46.0)
Hemoglobin: 10.9 g/dL — ABNORMAL LOW (ref 12.0–15.0)
Immature Granulocytes: 1 %
Lymphocytes Relative: 17 %
Lymphs Abs: 1 10*3/uL (ref 0.7–4.0)
MCH: 26.4 pg (ref 26.0–34.0)
MCHC: 28.9 g/dL — ABNORMAL LOW (ref 30.0–36.0)
MCV: 91.3 fL (ref 80.0–100.0)
Monocytes Absolute: 0.6 10*3/uL (ref 0.1–1.0)
Monocytes Relative: 9 %
Neutro Abs: 4.2 10*3/uL (ref 1.7–7.7)
Neutrophils Relative %: 66 %
Platelets: 224 10*3/uL (ref 150–400)
RBC: 4.13 MIL/uL (ref 3.87–5.11)
RDW: 13.5 % (ref 11.5–15.5)
WBC: 6.3 10*3/uL (ref 4.0–10.5)
nRBC: 0 % (ref 0.0–0.2)

## 2018-11-21 LAB — GLUCOSE, CAPILLARY
Glucose-Capillary: 156 mg/dL — ABNORMAL HIGH (ref 70–99)
Glucose-Capillary: 245 mg/dL — ABNORMAL HIGH (ref 70–99)
Glucose-Capillary: 209 mg/dL — ABNORMAL HIGH (ref 70–99)
Glucose-Capillary: 141 mg/dL — ABNORMAL HIGH (ref 70–99)
Glucose-Capillary: 289 mg/dL — ABNORMAL HIGH (ref 70–99)

## 2018-11-21 LAB — BASIC METABOLIC PANEL
Anion gap: 10 (ref 5–15)
BUN: 14 mg/dL (ref 8–23)
CO2: 36 mmol/L — ABNORMAL HIGH (ref 22–32)
Calcium: 9.8 mg/dL (ref 8.9–10.3)
Chloride: 94 mmol/L — ABNORMAL LOW (ref 98–111)
Creatinine, Ser: 0.71 mg/dL (ref 0.44–1.00)
GFR calc Af Amer: >60 mL/min (ref >60)
GFR calc non Af Amer: >60 mL/min (ref >60)
Glucose, Bld: 153 mg/dL — ABNORMAL HIGH (ref 70–99)
Potassium: 3.8 mmol/L (ref 3.5–5.1)
Sodium: 140 mmol/L (ref 135–145)

## 2018-11-21 MED ORDER — GABAPENTIN 100 MG PO CAPS
100.0000 mg | ORAL_CAPSULE | Freq: Three times a day (TID) | ORAL | Status: DC
  Administered 2018-11-21 – 2018-11-24 (×9): 100 mg via ORAL
  Filled 2018-11-21 (×9): qty 1

## 2018-11-21 MED ORDER — FUROSEMIDE 10 MG/ML IJ SOLN
60.0000 mg | Freq: Two times a day (BID) | INTRAMUSCULAR | Status: DC
  Administered 2018-11-21 – 2018-11-23 (×5): 60 mg via INTRAVENOUS
  Filled 2018-11-21 (×5): qty 6

## 2018-11-21 MED ORDER — ALBUTEROL SULFATE (2.5 MG/3ML) 0.083% IN NEBU: 2.5000 mg | INHALATION_SOLUTION | RESPIRATORY_TRACT | Status: DC | PRN

## 2018-11-21 NOTE — Progress Notes (Addendum)
PROGRESS NOTE  Caitlin Oliver WJX:[id number] DOB: 1940-08-04 DOA: 11/13/2018 PCP: [name], PA-C  Brief History   The patient is a 78 yr old woman who presented to [facility] ED with complaints of increased shortness of breath and leg swelling. She carries a medical history significant for advanced COPD for which she is followed by pulmonology, diastolic dysfunction CHF with an EF of 60% on an echocardiogram performed of March of this year. She also has anxiety disorder, peripheral vascular disease, diabetes, GERD, hyperlipidemia, hypertension, CAD, lung cancer, and CVA in March. She has required 2L of O2 since March. She has had increased shortness of breath with PND and orthopnea, and leg swelling for the last 2 weeks. She saw her PCP and her lasix was increased from 20 mg to 40 mg 2 days prior to presentation. She experienced increased urinary output, but her dyspnea continued to worsen. She presented to the ED with evidence of volume overload. She was admitted with diagnosis of acute exacerbation of diastolic CHF. She was requiring 3L O2 to maintain oxygenation in the low to mid 90's.  The patient was admitted to a telemetry bed. She was diuresed and cardiology was consulted. The patient was hypotensive and was therefore given IV fluids. She also demonstrated volume contraction. She was placed on an insulin sliding scale for her diabetes, and she was continued on her home medications for her anxiety and depression.  On the evening of 11/19/2018, the patient had an exacerbation of her dyspnea as she returned to her bed from the toilet. This was followed by pain in her left chest. EKG was performed and was negative for ischemic changes. Troponins were flat. She was given Hydrocodone for pain and metoprolol for elevated heart rate and blood pressures. She was transferred to [facility] from [place].  Echocardiogram 11/14/2018 demosntrated EF >65% with evidence of severe pulmonary hypertension.  Pulmonology was consulted.   The patient underwent right and left heart catheterization on 11/20/2018. It demonstrated: 1. Mild to moderate, non-obstructive coronary artery disease. 2. Severe pulmonary hypertension. 3. Moderately to severely elevated left and right heart filling pressures. 4. Mildly reduced Fick cardiac output/index. 5. Severe right brachiocephalic and subclavian artery stenoses with combined gradient of ~50 mmHg and possible reversal of flow in the right vertebral artery (subclavian steal). Recommendations are for:  1. Medical therapy and risk factor modification to prevent progression of coronary artery disease. 2. Continue gentle diuresis, as blood pressure tolerates.  Consultation of advanced heart failure team may be helpful. 3. Continue outpatient vascular follow-up, including monitoring of significant right brachiocephalic/subclavian artery stenoses.  The patient has been evaluated by PT/OT and the recommendation continues to be for discharge to SNF. Consultants  . Cardiology . Pulmonary Critical Care  Procedures  . Right and left heart catheterization  Antibiotics   Anti-infectives (From admission, onward)   Start     Dose/Rate Route Frequency Ordered Stop   11/14/18 1200  cephALEXin (KEFLEX) capsule 500 mg     500 mg Oral Every 6 hours 11/14/18 1116 11/19/18 0604      Subjective  The patient is awake, alert, and oriented x 3. No acute distress.  Objective   Vitals:  Vitals:   11/21/18 0952 11/21/18 1257  BP: 107/67   Pulse: 95   Resp:    Temp:    SpO2: 95% 92%    Exam:  Constitutional:  . The patient is awake, alert, and oriented x 3. No acute distress. Respiratory:  .  No increased work of breathing. . No wheezes or rhonchi. Positive for diffuse rales. . No tactile fremitus Cardiovascular:  . Regular rate and rhythm . No murmurs, ectopy, or gallups  . No lateral PMI. No thrills. Abdomen:  . Abdomen is soft, non-tender, non-distended .  No hernias, masses, or organomegaly . Normoactive bowel sounds. Musculoskeletal:  . No cyanosis or clubbing. [name] Positive for 2+ edema bilaterally. Skin:  . No rashes, lesions, ulcers . palpation of skin: no induration or nodules Neurologic:  . CN 2-12 intact . Sensation all 4 extremities intact Psychiatric:  . Mental status o Mood, affect appropriate o Orientation to person, place, time  . judgment and insight appear intact   I have personally reviewed the following:   Today's Data  . Vitals  Scheduled Meds: . aspirin  81 mg Oral Daily  . bisacodyl  10 mg Rectal Once  . budesonide (PULMICORT) nebulizer solution  0.25 mg Nebulization BID  . calcium carbonate  1,250 mg Oral QAC supper  . Chlorhexidine Gluconate Cloth  6 each Topical Daily  . Chlorhexidine Gluconate Cloth  6 each Topical Q0600  . clopidogrel  75 mg Oral Daily  . furosemide  60 mg Intravenous BID  . heparin  5,000 Units Subcutaneous Q8H  . insulin aspart  0-5 Units Subcutaneous QHS  . insulin aspart  0-9 Units Subcutaneous TID WC  . insulin glargine  8 Units Subcutaneous QHS  . levalbuterol  0.63 mg Nebulization TID  . magnesium oxide  200 mg Oral QAC supper  . mouth rinse  15 mL Mouth Rinse BID  . metoprolol tartrate  25 mg Oral BID WC  . mupirocin ointment  1 application Nasal BID  . omega-3 acid ethyl esters  1 g Oral Daily  . pantoprazole  40 mg Oral BID  . polyethylene glycol  17 g Oral BID  . pravastatin  40 mg Oral QAC supper  . sodium chloride flush  3 mL Intravenous Q12H  . sodium chloride flush  3 mL Intravenous Q12H  . traZODone  50 mg Oral QHS  . umeclidinium-vilanterol  1 puff Inhalation Daily   Continuous Infusions: . sodium chloride    . sodium chloride      Principal Problem:   Acute on chronic combined systolic and diastolic CHF (congestive heart failure) (HCC) Active Problems:   Essential hypertension   Type 2 diabetes mellitus (HCC)   PVD (peripheral vascular disease) with  claudication (HCC)   Carotid artery obstruction   COPD GOLD II   Cancer of left lung (HCC)   Acute on chronic heart failure with preserved ejection fraction (HFpEF) ([place])   Unstable angina (HCC)   LOS: 8 days   A & P   Acute on chronic diastolic CHF/pulmonary hypertension: Presented with a BNP greater than 400, last 2D echo done on 11/14/2018 showed preserved LVEF greater than 65% with right ventricular systolic pressure severely elevated 65.  Diuresing stopped due to hypotension, although net I&O -6.5 L since admission. Continue strict I's and O's and daily weight.  Peripheral vascular disease with chronic bilateral lower extremity tenderness with  self-reported tenderness with palpation of lower extremities bilaterally:  Chronic.Continue aspirin, Plavix and atorvastatin.  Significant stenosis of right subclavian vein and brachiocephalic artery: Optimize medical management. Pt is to follow up with vascular surgery as outpatient.  Severe pulmonary hypertension: As per pulmonology. I appreciate their assistance. Pt is not able to be compliant with CPAP due to claustrophobia.  Contraction alkalosis: Bicarb 42  on 11/19/2018. Resolving with use of acetazolamide for diuresis.Continue to follow electrolytes.  Acute on chronic hypoxic respiratory failure in the setting of COPD: On 2 L of oxygen continuously at baseline. She is currently requiring 4-5 liters to maintain saturations in the mid-90's. Continue nebulizers. No CTA necessary per pulmonology.  Resolved hypotension: Resolved with holding diuretics and the administration of volume and albumin. Cardizem and metoprolol have been restarted at reduced doses.  Left lower extremity cellulitis: The patient has completed 5 days of Keflex. The erythema and warmth appeared resolved. No fever or leukocytosis.  Bilateral lower extremity tenderness to touch: I believe that this is due to peripheral neuropathy. Will attempt to treat with gabapentin.  Will start at 100 mg tic.  Type 2 diabetes: Continue insulin sliding scale. Last hemoglobin A1c 7.9 on 06/17/2018. Glucoses have run from 127 to 245 in the last 24 hours.  Chronic anxiety/depression: Continue home psych medications.  GERD: Continue PPI  I have seen and examined this patient myself. I have spent 38 minutes in her evaluation and care.   DVT prophylaxis:heparin subcu 3 times daily Code Status:full  Family Communication: None available Disposition Plan:SNF  Henleigh Robello, DO [facility] Direct contact: see [email or web address]  7PM-7AM contact night coverage as above 11/21/2018, 3:23 PM  LOS: 7 days

## 2018-11-21 NOTE — Progress Notes (Signed)
Asked patient if she needed help with CPAP tonight, she stated she will not being using it, patient stated it smothers her. No distress noted.

## 2018-11-21 NOTE — Progress Notes (Signed)
Progress Note  Patient Name: Caitlin Oliver Date of Encounter: 11/21/2018  Primary Cardiologist:   Quay Burow, MD   Subjective   Somnolent.  She says "I hurt all over."  No acute SOB   Inpatient Medications    Scheduled Meds: . aspirin  81 mg Oral Daily  . bisacodyl  10 mg Rectal Once  . budesonide (PULMICORT) nebulizer solution  0.25 mg Nebulization BID  . calcium carbonate  1,250 mg Oral QAC supper  . Chlorhexidine Gluconate Cloth  6 each Topical Daily  . Chlorhexidine Gluconate Cloth  6 each Topical Q0600  . clopidogrel  75 mg Oral Daily  . furosemide  40 mg Intravenous Q12H  . heparin  5,000 Units Subcutaneous Q8H  . insulin aspart  0-5 Units Subcutaneous QHS  . insulin aspart  0-9 Units Subcutaneous TID WC  . insulin glargine  8 Units Subcutaneous QHS  . levalbuterol  0.63 mg Nebulization TID  . magnesium oxide  200 mg Oral QAC supper  . mouth rinse  15 mL Mouth Rinse BID  . metoprolol tartrate  25 mg Oral BID WC  . mupirocin ointment  1 application Nasal BID  . omega-3 acid ethyl esters  1 g Oral Daily  . pantoprazole  40 mg Oral BID  . polyethylene glycol  17 g Oral BID  . pravastatin  40 mg Oral QAC supper  . sodium chloride flush  3 mL Intravenous Q12H  . sodium chloride flush  3 mL Intravenous Q12H  . traZODone  50 mg Oral QHS  . umeclidinium-vilanterol  1 puff Inhalation Daily   Continuous Infusions: . sodium chloride    . sodium chloride     PRN Meds: sodium chloride, sodium chloride, acetaminophen, albuterol, diazepam, Glycerin (Adult), HYDROcodone-acetaminophen, meclizine, Muscle Rub, ondansetron (ZOFRAN) IV, ondansetron, sodium chloride flush, sodium chloride flush   Vital Signs    Vitals:   11/21/18 0618 11/21/18 0720 11/21/18 0721 11/21/18 0722  BP: 125/81     Pulse: (!) 108     Resp: 16     Temp: 98.1 F (36.7 C)     TempSrc: Oral     SpO2: (!) 88% 94% 98% 98%  Weight: 85.2 kg     Height:        Intake/Output Summary (Last 24  hours) at 11/21/2018 0858 Last data filed at 11/21/2018 0841 Gross per 24 hour  Intake 1019.35 ml  Output 202 ml  Net 817.35 ml   Filed Weights   11/18/18 1200 11/20/18 0303 11/21/18 0618  Weight: 80.2 kg 80.7 kg 85.2 kg    Telemetry    NSR, sinus tach - Personally Reviewed  ECG    NA - Personally Reviewed  Physical Exam   GEN: No  acute distress.   Neck: No  JVD Cardiac: RRR, no murmurs, rubs, or gallops.  Respiratory:    Decreased breath sounds GI: Soft, nontender, non-distended, normal bowel sounds  MS:  Mild/mod edema; No deformity. Neuro:   Nonfocal  Psych: Oriented and appropriate    Labs    Chemistry Recent Labs  Lab 11/17/18 0449 11/18/18 0431 11/19/18 0222 11/20/18 1525 11/20/18 1527 11/21/18 0525  NA 138 139 139 142 141 140  K 3.9 4.2 4.2 3.9 3.9 3.8  CL 89* 89* 91*  --   --  94*  CO2 37* 40* 42*  --   --  36*  GLUCOSE 210* 146* 141*  --   --  153*  BUN _0 --   --  14  CREATININE 0.90 0.87 0.75  --   --  0.71  CALCIUM 9.0 9.3 9.1  --   --  9.8  PROT 6.5 7.0 6.3*  --   --   --   ALBUMIN 3.6 3.9 3.5  --   --   --   AST 34 34 44*  --   --   --   ALT _0 --   --   --   ALKPHOS 44 50 47  --   --   --   BILITOT 0.4 0.7 0.5  --   --   --   GFRNONAA >60 >60 >60  --   --  >60  GFRAA >60 >60 >60  --   --  >60  ANIONGAP _1 --   --  10     Hematology Recent Labs  Lab 11/18/18 0431 11/19/18 0222 11/20/18 1525 11/20/18 1527 11/21/18 0525  WBC 6.4 6.4  --   --  6.3  RBC 4.41 4.26  --   --  4.13  HGB 11.8* 11.2* 12.6 12.6 10.9*  HCT 40.4 39.2 37.0 37.0 37.7  MCV 91.6 92.0  --   --  91.3  MCH 26.8 26.3  --   --  26.4  MCHC 29.2* 28.6*  --   --  28.9*  RDW 13.5 13.6  --   --  13.5  PLT 235 223  --   --  224    Cardiac EnzymesNo results for input(s): TROPONINI in the last 168 hours. No results for input(s): TROPIPOC in the last 168 hours.   BNP Recent Labs  Lab 11/18/18 0431  BNP 422.3*     DDimer  Recent Labs  Lab  11/19/18 2015  DDIMER 0.64*     Radiology    No results found.  Cardiac Studies   Echo:   1. The left ventricle has hyperdynamic systolic function, with an ejection fraction of >65%. The cavity size was normal. There is moderate asymmetric left ventricular hypertrophy of the septal wall. Left ventricular diastolic Doppler parameters are  indeterminate.  2. The right ventricle has mildly reduced systolic function. The cavity was severely enlarged. There is no increase in right ventricular wall thickness. Right ventricular systolic pressure is severely elevated with an estimated pressure of 65.8 mmHg.  3. Small pericardial effusion.  4. No stenosis of the aortic valve.  5. The aorta is normal in size and structure.  6. The aortic root is normal in size and structure.  7. The inferior vena cava was dilated in size with >50% respiratory variability.   Cath:     Right Heart Pressures RA (mean): 11 mmHg (mean), 16 mmHg (post-a wave) RV (S/EDP): 79/15 mmHg PA (S/D, mean): 79/39 (52) mmHg PCWP (mean): 31 mmHg  Ao sat: 88% PA sat: 49%  Fick CO: 4.3 L/min Fick CI: 2.2 L/min/m^2  PVR: 4.9 Wood units       Patient Profile     78 y.o. female with a hx of significant PVD including high grade bilateral carotid artery disease s/p staged right followed by left carotid endarterectomy performed by Dr.Brabham in 2014, left subclavian stenting in 2017, LE arterial disease s/p bilateral common iliac rotational atherectomy PTA and stenting in 2015, HTN, HLD, DM, tobacco abuse, COPD, lung cancer (stage IA squamous cell carcinoma) s/p left upper lobectomy in 2017, chronic home O2 (2L/min baseline), CVA, h/o GIB w/ Plavix and HFpEF, who is being seen for  the evaluation of acute CHF at the request of Dr. Nevada Crane, Internal Medicine.     Assessment & Plan    Acute on chronic diastolic HF:  Negative 6.7 liters.  I/O incomplete.   Weight seems to be up.   She has a moderately reduced CO.  She has  severe pulmonary HTN.   She does have improved oxygenation with diuresis and is much better than she was early on but still desats with eating.  Now on 2 liters at rest.  Continue IV diuresis.   I will increase to 60 mg IV bid.   PULM HTN:   Pulmonary is following.  No indication for vasoactive therapy.  Will defer further change in chronic lung disease therapy to their follow up.    HTN:  Holding Norvasc to allow for better pressure and diuresis.    BPs remain low.    RV Failure:   Pulmonary HTN likely secondary.   See above.    PVD:  Brachiocephalic and subclavian stenosis.  Medical management and continued risk reduction.     For questions or updates, please contact [place] Please consult [email or web address] for contact info under Cardiology/STEMI.   Signed, Minus Breeding, MD  11/21/2018, 8:58 AM

## 2018-11-21 NOTE — Plan of Care (Signed)
  Problem: Health Behavior/Discharge Planning: Goal: Ability to manage health-related needs will improve Outcome: Progressing   Problem: Clinical Measurements: Goal: Ability to maintain clinical measurements within normal limits will improve Outcome: Progressing Goal: Will remain free from infection Outcome: Progressing Goal: Diagnostic test results will improve Outcome: Progressing Goal: Respiratory complications will improve Outcome: Progressing Goal: Cardiovascular complication will be avoided Outcome: Progressing   Problem: Activity: Goal: Risk for activity intolerance will decrease Outcome: Progressing   Problem: Elimination: Goal: Will not experience complications related to bowel motility Outcome: Progressing   Problem: Safety: Goal: Ability to remain free from injury will improve Outcome: Progressing   Problem: Education: Goal: Ability to demonstrate management of disease process will improve Outcome: Progressing Goal: Ability to verbalize understanding of medication therapies will improve Outcome: Progressing Goal: Individualized Educational Video(s) Outcome: Progressing   Problem: Activity: Goal: Capacity to carry out activities will improve Outcome: Progressing   Problem: Cardiac: Goal: Ability to achieve and maintain adequate cardiopulmonary perfusion will improve Outcome: Progressing   

## 2018-11-21 NOTE — Progress Notes (Signed)
Pulmonary Note S: Seen in f/u for pulmonary HTN, RHC numbers reviewed.  Patient oxygenation improving. Too claustrophobic to wear CPAP.  O: Today's Vitals   11/21/18 0618 11/21/18 0720 11/21/18 0721 11/21/18 0722  BP: 125/81     Pulse: (!) 108     Resp: 16     Temp: 98.1 F (36.7 C)     TempSrc: Oral     SpO2: (!) 88% 94% 98% 98%  Weight: 85.2 kg     Height:      PainSc:       Body mass index is 29.42 kg/m. GEN: frail elderly woman in NAD HEENT: MMM, no thrush  CV: Tachycardic, ext warm PULM: No wheezing, diminished breath sounds bases  GI: Soft, +BS EXT: improved edema, no clubbing NEURO: Moves all 4 ext to command PSYCH: AOx3, good insight SKIN: Scattered bruising and thin skin  RA (mean): 11 mmHg (mean), 16 mmHg (post-a wave) RV (S/EDP): 79/15 mmHg PA (S/D, mean): 79/39 (52) mmHg PCWP (mean): 31 mmHg Ao sat: 88% PA sat: 49% Fick CO: 4.3 L/min Fick CI: 2.2 L/min/m^2 PVR: 4.9 Wood units  A: # Groups 2/3 pulm HTN with relatively preserved PVR # Volume overloaded state of heart # Probable sleep disordered breathing but intolerant to PAP  P - Continue breathing treatments as ordered - Given findings of LHC, no role for looking for group 1 disease - Diuresis per cardiology team - Will sign off and arrange 2 week f/u with midlevel or Dr. Nyra Market MD

## 2018-11-21 NOTE — Progress Notes (Signed)
Physical Therapy Treatment Patient Details Name: Caitlin Oliver MRN: [id number] DOB: [birth year] Today's Date: 11/21/2018    History of Present Illness 78 y.o. female with admitted with progressive SOB and CHf to [facility] on 7/30. Pt with bil pleural effusion, pulmonary HTN. PMhx: LUL lobectomy due to CA with RLL nodule presently, COPD on home O2, CHF, anxiety, PVD, GERD, HLD, HTN, CVA 3/20.    PT Comments    Pt pleasant sitting in recliner on arrival visiting with niece in room. Pt with improved mobility and cognition this session able to maintain SpO2 92-97% on 3L with gait with noted drop after toileting and walking 15' to chair down to 85% on 3L with noted change and decline in cognition, awareness and problem solving at that time. 24hr supervision remains appropriate for pt based on cardiopulmonary function and cognition with niece acknowledging cognitive deficits and that she is arranging 24hr assist for return home.  Will continue to follow to improve mobility and pulmonary tolerance.     Follow Up Recommendations  Home health PT;Supervision/Assistance - 24 hour(niece [name] present and confirms arrangement of 24hr supervision)     Equipment Recommendations  None recommended by PT    Recommendations for Other Services       Precautions / Restrictions Precautions Precautions: Fall Precaution Comments: monitor sats    Mobility  Bed Mobility               General bed mobility comments: in chair on arrival and end of session  Transfers Overall transfer level: Needs assistance   Transfers: Sit to/from Stand Sit to Stand: Min guard         General transfer comment: guarding for lines and safety to rise from recliner, rollator x 2 and toilet. Cues for hand placement and safety with not tangling lines  Ambulation/Gait Ambulation/Gait assistance: Min guard Gait Distance (Feet): 200 Feet Assistive device: 4-wheeled walker Gait Pattern/deviations: Step-through  pattern;Decreased stride length;Trunk flexed   Gait velocity interpretation: >2.62 ft/sec, indicative of community ambulatory General Gait Details: cues for posture. pt able to maintain spO2 92-97% on 3L throughout hall ambulation. Pt with seated rest after 200' then able to walk 100' and 100' each with seated rest between trials due to knee pain and fatigue   Stairs             Wheelchair Mobility    Modified Rankin (Stroke Patients Only)       Balance Overall balance assessment: Needs assistance Sitting-balance support: No upper extremity supported;Feet supported Sitting balance-Leahy Scale: Good     Standing balance support: Bilateral upper extremity supported Standing balance-Leahy Scale: Poor Standing balance comment: requires UE support                            Cognition Arousal/Alertness: Awake/alert Behavior During Therapy: WFL for tasks assessed/performed Overall Cognitive Status: Impaired/Different from baseline Area of Impairment: Attention;Problem solving;Safety/judgement                   Current Attention Level: Selective     Safety/Judgement: Decreased awareness of deficits     General Comments: pt with improved cognition from prior date, oriented to self and situation without jerking noted during session. Pt remains talkative and slightly tangential with gait with SpO2 >92% on 3L. However, after toileting and walking back to chair pt started asking for things that weren't present, taking off oxygen and acting more irratic with O2 noted to have  dropped to 85%      Exercises      General Comments        Pertinent Vitals/Pain Pain Score: 5  Pain Location: bil Knee during gait Pain Descriptors / Indicators: Aching;Shooting Pain Intervention(s): Limited activity within patient's tolerance;Repositioned;Monitored during session    Home Living                      Prior Function            PT Goals (current goals  can now be found in the care plan section) Progress towards PT goals: Progressing toward goals    Frequency    Min 3X/week      PT Plan Discharge plan needs to be updated    Co-evaluation              AM-PAC PT "6 Clicks" Mobility   Outcome Measure  Help needed turning from your back to your side while in a flat bed without using bedrails?: None Help needed moving from lying on your back to sitting on the side of a flat bed without using bedrails?: A Little Help needed moving to and from a bed to a chair (including a wheelchair)?: A Little Help needed standing up from a chair using your arms (e.g., wheelchair or bedside chair)?: A Little Help needed to walk in hospital room?: A Little Help needed climbing 3-5 steps with a railing? : A Little 6 Click Score: 19    End of Session Equipment Utilized During Treatment: Oxygen Activity Tolerance: Patient tolerated treatment well Patient left: in chair;with call bell/phone within reach;with chair alarm set;with family/visitor present Nurse Communication: Mobility status PT Visit Diagnosis: Unsteadiness on feet (R26.81);Muscle weakness (generalized) (M62.81);Difficulty in walking, not elsewhere classified (R26.2)     Time: 7124-5809 PT Time Calculation (min) (ACUTE ONLY): 36 min  Charges:  $Gait Training: 8-22 mins $Therapeutic Activity: 8-22 mins                     Feather Berrie Pam Drown, PT Acute Rehabilitation Services Pager: [phone] Office: [place] 11/21/2018, 1:59 PM

## 2018-11-22 DIAGNOSIS — C3492 Malignant neoplasm of unspecified part of left bronchus or lung: Secondary | ICD-10-CM

## 2018-11-22 LAB — GLUCOSE, CAPILLARY
Glucose-Capillary: 144 mg/dL — ABNORMAL HIGH (ref 70–99)
Glucose-Capillary: 211 mg/dL — ABNORMAL HIGH (ref 70–99)
Glucose-Capillary: 146 mg/dL — ABNORMAL HIGH (ref 70–99)
Glucose-Capillary: 558 mg/dL (ref 70–99)

## 2018-11-22 LAB — GLUCOSE, RANDOM: Glucose, Bld: 199 mg/dL — ABNORMAL HIGH (ref 70–99)

## 2018-11-22 LAB — ANA W/REFLEX IF POSITIVE: Anti Nuclear Antibody (ANA): NEGATIVE

## 2018-11-22 NOTE — Progress Notes (Signed)
Tech reported pt refused AM weight and vital signs. This nurse entered room to attempt at AM meds and upon awakening pt, pt asks this nurse if "she wants to die?". This nurse told pt not particularly but that decision wasn't up to me and pt stated "if you don't leave me alone and let me sleep it will be". This nurse attempted to educate pt on medications and procedures staff was attempting to perform and pt stated "get out of my damn room and leave me alone". This nurse complied.

## 2018-11-22 NOTE — Progress Notes (Signed)
Pt refused CPAP for tonight 

## 2018-11-22 NOTE — Progress Notes (Signed)
   Vital Signs MEWS/VS Documentation      11/22/2018 4431 11/22/2018 0924 11/22/2018 1030 11/22/2018 1332   MEWS Score:  -  -  0  -   MEWS Score Color:  -  -  Green  -   O2 Device:  Nasal Cannula  Nasal Cannula  -  Nasal Cannula   O2 Flow Rate (L/min):  3 L/min  3 L/min  -  3 L/min   Level of Consciousness:  -  -  Alert  -           Maud Deed Tobias-Diakun 11/22/2018,2:09 PM

## 2018-11-22 NOTE — Progress Notes (Signed)
PROGRESS NOTE  Caitlin Oliver IFO:[id number] DOB: 1941-03-28 DOA: 11/13/2018 PCP: [name], PA-C  Brief History   The patient is a 78 yr old woman who presented to [facility] ED with complaints of increased shortness of breath and leg swelling. She carries a medical history significant for advanced COPD for which she is followed by pulmonology, diastolic dysfunction CHF with an EF of 60% on an echocardiogram performed of March of this year. She also has anxiety disorder, peripheral vascular disease, diabetes, GERD, hyperlipidemia, hypertension, CAD, lung cancer, and CVA in March. She has required 2L of O2 since March. She has had increased shortness of breath with PND and orthopnea, and leg swelling for the last 2 weeks. She saw her PCP and her lasix was increased from 20 mg to 40 mg 2 days prior to presentation. She experienced increased urinary output, but her dyspnea continued to worsen. She presented to the ED with evidence of volume overload. She was admitted with diagnosis of acute exacerbation of diastolic CHF. She was requiring 3L O2 to maintain oxygenation in the low to mid 90's.  The patient was admitted to a telemetry bed. She was diuresed and cardiology was consulted. The patient was hypotensive and was therefore given IV fluids. She also demonstrated volume contraction. She was placed on an insulin sliding scale for her diabetes, and she was continued on her home medications for her anxiety and depression.  On the evening of 11/19/2018, the patient had an exacerbation of her dyspnea as she returned to her bed from the toilet. This was followed by pain in her left chest. EKG was performed and was negative for ischemic changes. Troponins were flat. She was given Hydrocodone for pain and metoprolol for elevated heart rate and blood pressures. She was transferred to [facility] from [place].  Echocardiogram 11/14/2018 demosntrated EF >65% with evidence of severe pulmonary hypertension.  Pulmonology was consulted.   The patient underwent right and left heart catheterization on 11/20/2018. It demonstrated: 1. Mild to moderate, non-obstructive coronary artery disease. 2. Severe pulmonary hypertension. 3. Moderately to severely elevated left and right heart filling pressures. 4. Mildly reduced Fick cardiac output/index. 5. Severe right brachiocephalic and subclavian artery stenoses with combined gradient of ~50 mmHg and possible reversal of flow in the right vertebral artery (subclavian steal). Recommendations are for:  1. Medical therapy and risk factor modification to prevent progression of coronary artery disease. 2. Continue gentle diuresis, as blood pressure tolerates.  Consultation of advanced heart failure team may be helpful. 3. Continue outpatient vascular follow-up, including monitoring of significant right brachiocephalic/subclavian artery stenoses.  The patient has been evaluated by PT/OT and the recommendation continues to be for discharge to SNF. Consultants  . Cardiology . Pulmonary Critical Care  Procedures  . Right and left heart catheterization  Antibiotics   Anti-infectives (From admission, onward)   Start     Dose/Rate Route Frequency Ordered Stop   11/14/18 1200  cephALEXin (KEFLEX) capsule 500 mg     500 mg Oral Every 6 hours 11/14/18 1116 11/19/18 0604      Subjective  The patient is awake, alert, and oriented x 3. No acute distress. She continues to have significant lower extremity edema.  Objective   Vitals:  Vitals:   11/22/18 0922 11/22/18 0924  BP:    Pulse:    Resp:    Temp:    SpO2: 94% 94%    Exam:  Constitutional:  . The patient is awake, alert, and oriented  x 3. No acute distress. Respiratory:  . No increased work of breathing. . No wheezes or rhonchi. No rales. . No tactile fremitus Cardiovascular:  . Regular rate and rhythm . No murmurs, ectopy, or gallups  . No lateral PMI. No thrills. Abdomen:  . Abdomen is  soft, non-tender, non-distended . No hernias, masses, or organomegaly . Normoactive bowel sounds. Musculoskeletal:  . No cyanosis or clubbing. [name] Positive for 2-3+ pitting edema bilaterally. Skin:  . No rashes, lesions, ulcers . palpation of skin: no induration or nodules Neurologic:  . CN 2-12 intact . Sensation all 4 extremities intact Psychiatric:  . Mental status o Mood, affect appropriate o Orientation to person, place, time  . judgment and insight appear intact   I have personally reviewed the following:   Today's Data  . Vitals  Scheduled Meds: . aspirin  81 mg Oral Daily  . bisacodyl  10 mg Rectal Once  . budesonide (PULMICORT) nebulizer solution  0.25 mg Nebulization BID  . calcium carbonate  1,250 mg Oral QAC supper  . Chlorhexidine Gluconate Cloth  6 each Topical Daily  . Chlorhexidine Gluconate Cloth  6 each Topical Q0600  . clopidogrel  75 mg Oral Daily  . furosemide  60 mg Intravenous BID  . gabapentin  100 mg Oral TID  . heparin  5,000 Units Subcutaneous Q8H  . insulin aspart  0-5 Units Subcutaneous QHS  . insulin aspart  0-9 Units Subcutaneous TID WC  . insulin glargine  8 Units Subcutaneous QHS  . levalbuterol  0.63 mg Nebulization TID  . magnesium oxide  200 mg Oral QAC supper  . mouth rinse  15 mL Mouth Rinse BID  . metoprolol tartrate  25 mg Oral BID WC  . mupirocin ointment  1 application Nasal BID  . omega-3 acid ethyl esters  1 g Oral Daily  . pantoprazole  40 mg Oral BID  . polyethylene glycol  17 g Oral BID  . pravastatin  40 mg Oral QAC supper  . sodium chloride flush  3 mL Intravenous Q12H  . sodium chloride flush  3 mL Intravenous Q12H  . traZODone  50 mg Oral QHS  . umeclidinium-vilanterol  1 puff Inhalation Daily   Continuous Infusions: . sodium chloride    . sodium chloride      Principal Problem:   Acute on chronic combined systolic and diastolic CHF (congestive heart failure) (HCC) Active Problems:   Essential hypertension    Type 2 diabetes mellitus (HCC)   PVD (peripheral vascular disease) with claudication (HCC)   Carotid artery obstruction   COPD GOLD II   Cancer of left lung (HCC)   Acute on chronic heart failure with preserved ejection fraction (HFpEF) ([place])   Unstable angina (HCC)   LOS: 9 days   A & P   Acute on chronic diastolic CHF/pulmonary hypertension: Presented with a BNP greater than 400, last 2D echo done on 11/14/2018 showed preserved LVEF greater than 65% with right ventricular systolic pressure severely elevated 65. Net I&O 5730 L since admission. Continue strict I's and O's and daily weight. Blood pressures are improved.  Peripheral vascular disease with chronic bilateral lower extremity tenderness with  self-reported tenderness with palpation of lower extremities bilaterally:  Chronic.Continue aspirin, Plavix and atorvastatin.  Significant stenosis of right subclavian vein and brachiocephalic artery: Optimize medical management. Pt is to follow up with vascular surgery as outpatient.  Severe pulmonary hypertension: As per pulmonology. I appreciate their assistance. Pt is not able to  be compliant with CPAP due to claustrophobia.  Contraction alkalosis: Bicarb 42 on 11/19/2018. Resolving with use of acetazolamide for diuresis.Continue to follow electrolytes.  Acute on chronic hypoxic respiratory failure in the setting of COPD: She is currently saturating in the mid-nineties on 3 L of oxygen. She is on 2L o2 continuously at baseline.   Hypotension: Resolved with holding diuretics and the administration of volume and albumin. Cardizem and metoprolol have been restarted at reduced doses. Blood pressures can now tolerate diuresis.  Left lower extremity cellulitis: The patient has completed 5 days of Keflex. The erythema and warmth appeared resolved. No fever or leukocytosis.  Bilateral lower extremity tenderness to touch: I believe that this is due to peripheral neuropathy. Will attempt to  treat with gabapentin. Will start at 100 mg tid.  Type 2 diabetes: Continue insulin sliding scale. Last hemoglobin A1c 7.9 on 06/17/2018. Glucoses have run from 141 to 289 in the last 24 hours.  Chronic anxiety/depression: Continue home psych medications.  GERD: Continue PPI  I have seen and examined this patient myself. I have spent 32 minutes in her evaluation and care.   DVT prophylaxis:heparin subcu 3 times daily Code Status:full  Family Communication: I have discussed the patient with her niece, Prince Solian, in detail today. All questions answered to the best of my ability. Disposition Plan:SNF  Jaeger Trueheart, DO [facility] Direct contact: see [email or web address]  7PM-7AM contact night coverage as above 11/22/2018, 3:16 PM  LOS: 7 days

## 2018-11-22 NOTE — Progress Notes (Signed)
Progress Note  Patient Name: Caitlin Oliver Date of Encounter: 11/22/2018  Primary Cardiologist: Quay Burow, MD   Subjective   Sitting on the commode, urinating.  Shortness of breath mildly improved.  Still having some swelling in her legs.  Inpatient Medications    Scheduled Meds: . aspirin  81 mg Oral Daily  . bisacodyl  10 mg Rectal Once  . budesonide (PULMICORT) nebulizer solution  0.25 mg Nebulization BID  . calcium carbonate  1,250 mg Oral QAC supper  . Chlorhexidine Gluconate Cloth  6 each Topical Daily  . Chlorhexidine Gluconate Cloth  6 each Topical Q0600  . clopidogrel  75 mg Oral Daily  . furosemide  60 mg Intravenous BID  . gabapentin  100 mg Oral TID  . heparin  5,000 Units Subcutaneous Q8H  . insulin aspart  0-5 Units Subcutaneous QHS  . insulin aspart  0-9 Units Subcutaneous TID WC  . insulin glargine  8 Units Subcutaneous QHS  . levalbuterol  0.63 mg Nebulization TID  . magnesium oxide  200 mg Oral QAC supper  . mouth rinse  15 mL Mouth Rinse BID  . metoprolol tartrate  25 mg Oral BID WC  . mupirocin ointment  1 application Nasal BID  . omega-3 acid ethyl esters  1 g Oral Daily  . pantoprazole  40 mg Oral BID  . polyethylene glycol  17 g Oral BID  . pravastatin  40 mg Oral QAC supper  . sodium chloride flush  3 mL Intravenous Q12H  . sodium chloride flush  3 mL Intravenous Q12H  . traZODone  50 mg Oral QHS  . umeclidinium-vilanterol  1 puff Inhalation Daily   Continuous Infusions: . sodium chloride    . sodium chloride     PRN Meds: sodium chloride, sodium chloride, acetaminophen, albuterol, diazepam, Glycerin (Adult), HYDROcodone-acetaminophen, meclizine, Muscle Rub, ondansetron (ZOFRAN) IV, ondansetron, sodium chloride flush, sodium chloride flush   Vital Signs    Vitals:   11/21/18 2015 11/22/18 0919 11/22/18 0922 11/22/18 0924  BP: 128/76     Pulse: (!) 101     Resp: 18     Temp: 98.4 F (36.9 C)     TempSrc: Oral     SpO2: 93% 94%  94% 94%  Weight:      Height:        Intake/Output Summary (Last 24 hours) at 11/22/2018 1009 Last data filed at 11/21/2018 2350 Gross per 24 hour  Intake 240 ml  Output 2 ml  Net 238 ml   Last 3 Weights 11/22/2018 11/21/2018 11/20/2018  Weight (lbs) (No Data) 187 lb 13.3 oz 177 lb 14.4 oz  Weight (kg) (No Data) 85.2 kg 80.695 kg      Telemetry    Sinus rhythm- Personally Reviewed  ECG    Normal sinus rhythm- Personally Reviewed  Physical Exam   GEN: No acute distress.   Neck: No JVD Cardiac: RRR, no murmurs, rubs, or gallops.  Respiratory:  Mildly decreased breath sounds bilaterally. GI: Soft, nontender, non-distended  MS: 1+ lower extremity edema; No deformity. Neuro:  Nonfocal  Psych: Normal affect   Labs    High Sensitivity Troponin:   Recent Labs  Lab 11/19/18 2015 11/19/18 2126  TROPONINIHS 72* 75*      Cardiac EnzymesNo results for input(s): TROPONINI in the last 168 hours. No results for input(s): TROPIPOC in the last 168 hours.   Chemistry Recent Labs  Lab 11/17/18 [phone] 11/18/18 0431 11/19/18 0222 11/20/18 1525 11/20/18 1527  11/21/18 0525  NA 138 139 139 142 141 140  K 3.9 4.2 4.2 3.9 3.9 3.8  CL 89* 89* 91*  --   --  94*  CO2 37* 40* 42*  --   --  36*  GLUCOSE 210* 146* 141*  --   --  153*  BUN _0 --   --  14  CREATININE 0.90 0.87 0.75  --   --  0.71  CALCIUM 9.0 9.3 9.1  --   --  9.8  PROT 6.5 7.0 6.3*  --   --   --   ALBUMIN 3.6 3.9 3.5  --   --   --   AST 34 34 44*  --   --   --   ALT _1 --   --   --   ALKPHOS 44 50 47  --   --   --   BILITOT 0.4 0.7 0.5  --   --   --   GFRNONAA >60 >60 >60  --   --  >60  GFRAA >60 >60 >60  --   --  >60  ANIONGAP _2 --   --  10     Hematology Recent Labs  Lab 11/18/18 0431 11/19/18 0222 11/20/18 1525 11/20/18 1527 11/21/18 0525  WBC 6.4 6.4  --   --  6.3  RBC 4.41 4.26  --   --  4.13  HGB 11.8* 11.2* 12.6 12.6 10.9*  HCT 40.4 39.2 37.0 37.0 37.7  MCV 91.6 92.0  --   --   91.3  MCH 26.8 26.3  --   --  26.4  MCHC 29.2* 28.6*  --   --  28.9*  RDW 13.5 13.6  --   --  13.5  PLT 235 223  --   --  224    BNP Recent Labs  Lab 11/18/18 0431  BNP 422.3*     DDimer  Recent Labs  Lab 11/19/18 2015  DDIMER 0.64*     Radiology    No results found.  Cardiac Studies   As described below.  Patient Profile     78 y.o. female hx of significant PVD including high grade bilateral carotid artery disease s/pstaged right followed by left carotid endarterectomy performed by Dr.Brabhamin 2014, left subclavian stentingin 2017, LE arterial disease s/p bilateral common iliac rotational atherectomy PTA and stenting in 2015, HTN, HLD, DM, tobacco abuse, COPD, lung cancer (stage IA squamous cell carcinoma) s/p left upper lobectomy in 2017, chronic home O2(2L/min baseline), CVA, h/o GIB w/ Plavix and HFpEF,who is being seen for the evaluation ofacute CHFat the request of Dr. Nevada Crane, Internal Medicine.  Assessment & Plan    Acute on chronic diastolic heart failure - Unable to get accurate output due to patient compliance Creatinine 0.71, continue with IV diuresis. - Minimal nonobstructive coronary artery disease on catheterization.  Pulmonary hypertension with RV failure - Estimated pressures 66 mmHg.  On echo.  Right heart catheterization showed a wedge of 31 and a mean pulmonary artery pressure of 52. -Pulmonary has seen, no vasoactive therapy.  Peripheral vascular disease -Carotid endarterectomy in the past with subclavian stenting.  Bilateral common iliac atherectomy as well in 2015.  Medical management.  Essential hypertension -Norvasc is currently on hold to allow for increased blood pressure for diuresis.  COPD - Prior tobacco use, lung cancer stage Ia squamous cell with lobectomy in 2017.  Chronic home O2.  Prior history  of GI bleed with Plavix  Mildly elevated high-sensitivity troponin-72 -Secondary to underlying pulmonary hypertension/lung  condition       For questions or updates, please contact [place] Please consult [email or web address] for contact info under        Signed, Candee Furbish, MD  11/22/2018, 10:09 AM

## 2018-11-23 LAB — BASIC METABOLIC PANEL
Anion gap: 13 (ref 5–15)
BUN: 17 mg/dL (ref 8–23)
CO2: 36 mmol/L — ABNORMAL HIGH (ref 22–32)
Calcium: 9.6 mg/dL (ref 8.9–10.3)
Chloride: 90 mmol/L — ABNORMAL LOW (ref 98–111)
Creatinine, Ser: 0.68 mg/dL (ref 0.44–1.00)
GFR calc Af Amer: >60 mL/min (ref >60)
GFR calc non Af Amer: >60 mL/min (ref >60)
Glucose, Bld: 130 mg/dL — ABNORMAL HIGH (ref 70–99)
Potassium: 3.6 mmol/L (ref 3.5–5.1)
Sodium: 139 mmol/L (ref 135–145)

## 2018-11-23 LAB — GLUCOSE, CAPILLARY
Glucose-Capillary: 126 mg/dL — ABNORMAL HIGH (ref 70–99)
Glucose-Capillary: 149 mg/dL — ABNORMAL HIGH (ref 70–99)
Glucose-Capillary: 200 mg/dL — ABNORMAL HIGH (ref 70–99)
Glucose-Capillary: 261 mg/dL — ABNORMAL HIGH (ref 70–99)

## 2018-11-23 LAB — RHEUMATOID FACTOR: Rheumatoid fact SerPl-aCnc: <10 IU/mL (ref 0.0–13.9)

## 2018-11-23 MED ORDER — FUROSEMIDE 10 MG/ML IJ SOLN
80.0000 mg | Freq: Two times a day (BID) | INTRAMUSCULAR | Status: DC
  Administered 2018-11-23 – 2018-11-24 (×2): 80 mg via INTRAVENOUS
  Filled 2018-11-23 (×2): qty 8

## 2018-11-23 NOTE — Progress Notes (Signed)
PROGRESS NOTE  Caitlin Oliver DVV:[id number] DOB: [birth year] DOA: 11/13/2018 PCP: [name], PA-C  Brief History   The patient is a 78 yr old woman who presented to [facility] ED with complaints of increased shortness of breath and leg swelling. She carries a medical history significant for advanced COPD for which she is followed by pulmonology, diastolic dysfunction CHF with an EF of 60% on an echocardiogram performed of March of this year. She also has anxiety disorder, peripheral vascular disease, diabetes, GERD, hyperlipidemia, hypertension, CAD, lung cancer, and CVA in March. She has required 2L of O2 since March. She has had increased shortness of breath with PND and orthopnea, and leg swelling for the last 2 weeks. She saw her PCP and her lasix was increased from 20 mg to 40 mg 2 days prior to presentation. She experienced increased urinary output, but her dyspnea continued to worsen. She presented to the ED with evidence of volume overload. She was admitted with diagnosis of acute exacerbation of diastolic CHF. She was requiring 3L O2 to maintain oxygenation in the low to mid 90's.  The patient was admitted to a telemetry bed. She was diuresed and cardiology was consulted. The patient was hypotensive and was therefore given IV fluids. She also demonstrated volume contraction. She was placed on an insulin sliding scale for her diabetes, and she was continued on her home medications for her anxiety and depression.  On the evening of 11/19/2018, the patient had an exacerbation of her dyspnea as she returned to her bed from the toilet. This was followed by pain in her left chest. EKG was performed and was negative for ischemic changes. Troponins were flat. She was given Hydrocodone for pain and metoprolol for elevated heart rate and blood pressures. She was transferred to [facility] from [place].  Echocardiogram 11/14/2018 demosntrated EF >65% with evidence of severe pulmonary hypertension.  Pulmonology was consulted.   The patient underwent right and left heart catheterization on 11/20/2018. It demonstrated: 1. Mild to moderate, non-obstructive coronary artery disease. 2. Severe pulmonary hypertension. 3. Moderately to severely elevated left and right heart filling pressures. 4. Mildly reduced Fick cardiac output/index. 5. Severe right brachiocephalic and subclavian artery stenoses with combined gradient of ~50 mmHg and possible reversal of flow in the right vertebral artery (subclavian steal). Recommendations are for:  1. Medical therapy and risk factor modification to prevent progression of coronary artery disease. 2. Continue gentle diuresis, as blood pressure tolerates.  Consultation of advanced heart failure team may be helpful. 3. Continue outpatient vascular follow-up, including monitoring of significant right brachiocephalic/subclavian artery stenoses.  The patient has been evaluated by PT/OT and the recommendation continues to be for discharge to SNF. Consultants  . Cardiology . Pulmonary Critical Care  Procedures  . Right and left heart catheterization  Antibiotics   Anti-infectives (From admission, onward)   Start     Dose/Rate Route Frequency Ordered Stop   11/14/18 1200  cephALEXin (KEFLEX) capsule 500 mg     500 mg Oral Every 6 hours 11/14/18 1116 11/19/18 0604      Subjective  The patient is awake, alert, and oriented x 3. No acute distress. She continues to have significant lower extremity edema and dyspnea with activity.  Objective   Vitals:  Vitals:   11/23/18 0523 11/23/18 0703  BP: 115/66   Pulse: 82   Resp: 18   Temp: 98.2 F (36.8 C)   SpO2: 98% 98%    Exam:  Constitutional:  .  The patient is awake, alert, and oriented x 3. No acute distress. Respiratory:  . No increased work of breathing. . No wheezes or rhonchi. Mild rales at bases bilaterally. . No tactile fremitus Cardiovascular:  . Regular rate and rhythm . No murmurs,  ectopy, or gallups  . No lateral PMI. No thrills. Abdomen:  . Abdomen is soft, non-tender, non-distended . No hernias, masses, or organomegaly . Normoactive bowel sounds. Musculoskeletal:  . No cyanosis or clubbing. [name] Positive for 2+ pitting edema bilaterally. Skin:  . No rashes, lesions, ulcers . palpation of skin: no induration or nodules Neurologic:  . CN 2-12 intact . Sensation all 4 extremities intact Psychiatric:  . Mental status o Mood, affect appropriate o Orientation to person, place, time  . judgment and insight appear intact   I have personally reviewed the following:   Today's Data  . Vitals  Scheduled Meds: . aspirin  81 mg Oral Daily  . bisacodyl  10 mg Rectal Once  . budesonide (PULMICORT) nebulizer solution  0.25 mg Nebulization BID  . calcium carbonate  1,250 mg Oral QAC supper  . Chlorhexidine Gluconate Cloth  6 each Topical Daily  . Chlorhexidine Gluconate Cloth  6 each Topical Q0600  . clopidogrel  75 mg Oral Daily  . furosemide  80 mg Intravenous BID  . gabapentin  100 mg Oral TID  . heparin  5,000 Units Subcutaneous Q8H  . insulin aspart  0-5 Units Subcutaneous QHS  . insulin aspart  0-9 Units Subcutaneous TID WC  . insulin glargine  8 Units Subcutaneous QHS  . levalbuterol  0.63 mg Nebulization TID  . magnesium oxide  200 mg Oral QAC supper  . mouth rinse  15 mL Mouth Rinse BID  . metoprolol tartrate  25 mg Oral BID WC  . mupirocin ointment  1 application Nasal BID  . omega-3 acid ethyl esters  1 g Oral Daily  . pantoprazole  40 mg Oral BID  . polyethylene glycol  17 g Oral BID  . pravastatin  40 mg Oral QAC supper  . sodium chloride flush  3 mL Intravenous Q12H  . sodium chloride flush  3 mL Intravenous Q12H  . traZODone  50 mg Oral QHS  . umeclidinium-vilanterol  1 puff Inhalation Daily   Continuous Infusions: . sodium chloride    . sodium chloride      Principal Problem:   Acute on chronic combined systolic and diastolic CHF  (congestive heart failure) (HCC) Active Problems:   Essential hypertension   Type 2 diabetes mellitus (HCC)   PVD (peripheral vascular disease) with claudication (HCC)   Carotid artery obstruction   COPD GOLD II   Cancer of left lung (HCC)   Acute on chronic heart failure with preserved ejection fraction (HFpEF) ([place])   Unstable angina (HCC)   LOS: 10 days   A & P   Acute on chronic diastolic CHF/pulmonary hypertension: Presented with a BNP greater than 400, last 2D echo done on 11/14/2018 showed preserved LVEF greater than 65% with right ventricular systolic pressure severely elevated 65. Net I&O 5730 L since admission. Continue strict I's and O's and daily weight. Continue diuresis.  Peripheral vascular disease with chronic bilateral lower extremity tenderness with  self-reported tenderness with palpation of lower extremities bilaterally:  Chronic.Continue aspirin, Plavix and atorvastatin.  Significant stenosis of right subclavian vein and brachiocephalic artery: Optimize medical management. Pt is to follow up with vascular surgery as outpatient.  Severe pulmonary hypertension: As per pulmonology. I  appreciate their assistance. Pt is not able to be compliant with CPAP due to claustrophobia.  Contraction alkalosis: Bicarb 42 on 11/19/2018. Resolving with use of acetazolamide for diuresis.Continue to follow electrolytes.  Acute on chronic hypoxic respiratory failure in the setting of COPD: She is currently saturating in the high-nineties on 2 L of oxygen. She is on 2L o2 continuously at baseline.   Hypotension: Resolved with holding diuretics and the administration of volume and albumin. Cardizem and metoprolol have been restarted at reduced doses. Blood pressures can now tolerate diuresis.  Left lower extremity cellulitis: The patient has completed 5 days of Keflex. The erythema and warmth appeared resolved. No fever or leukocytosis.  Bilateral lower extremity tenderness to touch:  I believe that this is due to peripheral neuropathy. Will attempt to treat with gabapentin. Will start at 100 mg tid.  Type 2 diabetes: Continue insulin sliding scale. Last hemoglobin A1c 7.9 on 06/17/2018. Glucoses have run from 141 to 289 in the last 24 hours.  Chronic anxiety/depression: Continue home psych medications.  GERD: Continue PPI  I have seen and examined this patient myself. I have spent 30 minutes in her evaluation and care.   DVT prophylaxis:heparin subcu 3 times daily Code Status:full  Family Communication: I have discussed the patient with her niece, Prince Solian, in detail today. All questions answered to the best of my ability. Disposition Plan:SNF  Jemiah Ellenburg, DO [facility] Direct contact: see [email or web address]  7PM-7AM contact night coverage as above 11/23/2018, 4:01 PM  LOS: 7 days

## 2018-11-23 NOTE — Evaluation (Signed)
Occupational Therapy Evaluation Patient Details Name: Caitlin Oliver MRN: [id number] DOB: 1940-09-27 Today's Date: 11/23/2018    History of Present Illness 78 y.o. female with admitted with progressive SOB and CHf to [facility] on 7/30. Pt with bil pleural effusion, pulmonary HTN. PMhx: LUL lobectomy due to CA with RLL nodule presently, COPD on home O2, CHF, anxiety, PVD, GERD, HLD, HTN, CVA 3/20.   Clinical Impression   Pt admitted with above. She demonstrates the below listed deficits and will benefit from continued OT to maximize safety and independence with BADLs.  Pt presents to OT with generalized weakness, decreased activity tolerance, impaired balance, and decreased cognition - pt scored 11/28 on the Short Blessed Test - she demonstrated deficits with attention/concentration, sequencing, orientation to time of day,and problem solving.  She currently requires min guard assist for ADLs.  Recommend 24 hour assist at discharge due to cognitive deficits and recommend HHOT for cognition and IADLs.  Will follow.       Follow Up Recommendations  Home health OT;Supervision/Assistance - 24 hour    Equipment Recommendations  None recommended by OT    Recommendations for Other Services       Precautions / Restrictions Precautions Precautions: Fall Precaution Comments: monitor sats      Mobility Bed Mobility               General bed mobility comments: in chair   Transfers Overall transfer level: Needs assistance Equipment used: Rolling walker (2 wheeled) Transfers: Sit to/from [employer] Transfers Sit to Stand: Min guard Stand pivot transfers: Min guard            Balance Overall balance assessment: Needs assistance Sitting-balance support: No upper extremity supported;Feet supported Sitting balance-Leahy Scale: Good     Standing balance support: Bilateral upper extremity supported Standing balance-Leahy Scale: Poor Standing balance comment: requires UE  support                           ADL either performed or assessed with clinical judgement   ADL Overall ADL's : Needs assistance/impaired Eating/Feeding: Independent   Grooming: Wash/dry hands;Wash/dry face;Oral care;Brushing hair;Min guard;Standing   Upper Body Bathing: Set up;Sitting   Lower Body Bathing: Min guard;Sit to/from stand   Upper Body Dressing : Set up;Sitting   Lower Body Dressing: Min guard;Sit to/from stand   Toilet Transfer: Min guard;Ambulation;Comfort height toilet;RW   Toileting- Water quality scientist and Hygiene: Min guard;Sit to/from stand       Functional mobility during ADLs: Min guard;Rolling walker General ADL Comments: Pt able to tolerate static standing >8 mins which is considered functional standing      Vision Baseline Vision/History: Wears glasses Wears Glasses: At all times Patient Visual Report: No change from baseline       Perception     Praxis      Pertinent Vitals/Pain Pain Assessment: No/denies pain     Hand Dominance Right   Extremity/Trunk Assessment Upper Extremity Assessment Upper Extremity Assessment: Generalized weakness   Lower Extremity Assessment Lower Extremity Assessment: Generalized weakness   Cervical / Trunk Assessment Cervical / Trunk Assessment: Normal   Communication Communication Communication: No difficulties   Cognition Arousal/Alertness: Awake/alert Behavior During Therapy: WFL for tasks assessed/performed Overall Cognitive Status: Impaired/Different from baseline Area of Impairment: Attention;Safety/judgement;Awareness;Problem solving;Orientation;Memory                 Orientation Level: Time(was not accurate with time of day ) Current Attention Level: Selective  Safety/Judgement: Decreased awareness of deficits;Decreased awareness of safety   Problem Solving: Difficulty sequencing;Requires verbal cues General Comments: Short Blessed Test Administered:  Pt scored  11/28 which is consistent with cognitive deficit.  She demonstrated difficulty with attention/concentration, sequencing, memory, and orientation to time    General Comments  pt with DOE 2/4 on 2L supplemental 02    Exercises     Shoulder Instructions      Home Living Family/patient expects to be discharged to:: Private residence Living Arrangements: Alone Available Help at Discharge: Family;Available 24 hours/day Type of Home: House Home Access: Stairs to enter [employer] of Steps: 6 Entrance Stairs-Rails: Right Home Layout: One level     Bathroom Shower/Tub: Occupational psychologist: Handicapped height Bathroom Accessibility: Yes How Accessible: Accessible via walker Home Equipment: Cleaton - 2 wheels;Shower seat;Grab bars - tub/shower;Hand held Tourist information centre manager - 4 wheels   Additional Comments: home o2 2L/min      Prior Functioning/Environment Level of Independence: Independent with assistive device(s)        Comments: Amb with rollato.  she reports she was driving and grocery shopping, but later states niece was driving her to grocery and assisting her         OT Problem List: Decreased strength;Decreased activity tolerance;Impaired balance (sitting and/or standing);Decreased cognition;Decreased safety awareness;Decreased knowledge of use of DME or AE;Cardiopulmonary status limiting activity      OT Treatment/Interventions: Self-care/ADL training;Therapeutic exercise;Energy conservation;DME and/or AE instruction;Therapeutic activities;Cognitive remediation/compensation;Patient/family education;Balance training    OT Goals(Current goals can be found in the care plan section) Acute Rehab OT Goals Patient Stated Goal: to hopefully go home tomorrow  OT Goal Formulation: With patient Time For Goal Achievement: 12/07/18 Potential to Achieve Goals: Good ADL Goals Pt Will Perform Grooming: with modified independence;standing Pt Will Perform Upper  Body Bathing: with modified independence;sitting Pt Will Perform Lower Body Bathing: with modified independence;sit to/from stand Pt Will Perform Upper Body Dressing: with modified independence;sitting Pt Will Perform Lower Body Dressing: with modified independence;sit to/from stand Pt Will Transfer to Toilet: with modified independence;ambulating;regular height toilet;grab bars Pt Will Perform Toileting - Clothing Manipulation and hygiene: with modified independence;sit to/from stand Additional ADL Goal #1: Pt will independently incorporate energy conservation techniques into daily activities  OT Frequency: Min 2X/week   Barriers to D/C:            Co-evaluation              AM-PAC OT "6 Clicks" Daily Activity     Outcome Measure Help from another person eating meals?: None Help from another person taking care of personal grooming?: A Little Help from another person toileting, which includes using toliet, bedpan, or urinal?: A Little Help from another person bathing (including washing, rinsing, drying)?: A Little Help from another person to put on and taking off regular upper body clothing?: A Little Help from another person to put on and taking off regular lower body clothing?: A Little 6 Click Score: 19   End of Session Equipment Utilized During Treatment: Rolling walker;Oxygen Nurse Communication: Mobility status  Activity Tolerance: Patient tolerated treatment well Patient left: in chair;with call bell/phone within reach;with chair alarm set  OT Visit Diagnosis: Unsteadiness on feet (R26.81)                Time: 9038-3338 OT Time Calculation (min): 29 min Charges:  OT General Charges $OT Visit: 1 Visit OT Evaluation $OT Eval Moderate Complexity: 1 Mod OT Treatments $Self Care/Home Management :  8-22 mins  Lucille Passy, OTR/L Acute Rehabilitation Services Pager [phone] Office [phone]   Lucille Passy M 11/23/2018, 6:25 PM

## 2018-11-23 NOTE — Progress Notes (Signed)
Progress Note  Patient Name: Caitlin Oliver Date of Encounter: 11/23/2018  Primary Cardiologist: Quay Burow, MD   Subjective   Feeling a little bit better but still not quite at baseline from a breathing perspective  Inpatient Medications    Scheduled Meds: . aspirin  81 mg Oral Daily  . bisacodyl  10 mg Rectal Once  . budesonide (PULMICORT) nebulizer solution  0.25 mg Nebulization BID  . calcium carbonate  1,250 mg Oral QAC supper  . Chlorhexidine Gluconate Cloth  6 each Topical Daily  . Chlorhexidine Gluconate Cloth  6 each Topical Q0600  . clopidogrel  75 mg Oral Daily  . furosemide  60 mg Intravenous BID  . gabapentin  100 mg Oral TID  . heparin  5,000 Units Subcutaneous Q8H  . insulin aspart  0-5 Units Subcutaneous QHS  . insulin aspart  0-9 Units Subcutaneous TID WC  . insulin glargine  8 Units Subcutaneous QHS  . levalbuterol  0.63 mg Nebulization TID  . magnesium oxide  200 mg Oral QAC supper  . mouth rinse  15 mL Mouth Rinse BID  . metoprolol tartrate  25 mg Oral BID WC  . mupirocin ointment  1 application Nasal BID  . omega-3 acid ethyl esters  1 g Oral Daily  . pantoprazole  40 mg Oral BID  . polyethylene glycol  17 g Oral BID  . pravastatin  40 mg Oral QAC supper  . sodium chloride flush  3 mL Intravenous Q12H  . sodium chloride flush  3 mL Intravenous Q12H  . traZODone  50 mg Oral QHS  . umeclidinium-vilanterol  1 puff Inhalation Daily   Continuous Infusions: . sodium chloride    . sodium chloride     PRN Meds: sodium chloride, sodium chloride, acetaminophen, albuterol, diazepam, Glycerin (Adult), HYDROcodone-acetaminophen, meclizine, Muscle Rub, ondansetron (ZOFRAN) IV, ondansetron, sodium chloride flush, sodium chloride flush   Vital Signs    Vitals:   11/22/18 2128 11/23/18 0519 11/23/18 0523 11/23/18 0703  BP:   115/66   Pulse:   82   Resp:   18   Temp:   98.2 F (36.8 C)   TempSrc:   Oral   SpO2: 97%  98% 98%  Weight:  79.5 kg     Height:        Intake/Output Summary (Last 24 hours) at 11/23/2018 1101 Last data filed at 11/23/2018 1033 Gross per 24 hour  Intake 1020 ml  Output 1050 ml  Net -30 ml   Last 3 Weights 11/23/2018 11/22/2018 11/21/2018  Weight (lbs) 175 lb 4.8 oz (No Data) 187 lb 13.3 oz  Weight (kg) 79.516 kg (No Data) 85.2 kg      Telemetry    Sinus rhythm no changes- Personally Reviewed  ECG    Normal sinus rhythm, no changes- Personally Reviewed  Physical Exam   GEN: Well nourished, well developed, in no acute distress  HEENT: normal  Neck: no JVD, carotid bruits, or masses Cardiac: RRR; no murmurs, rubs, or gallops,no edema  Respiratory:  clear to auscultation bilaterally, normal work of breathing GI: soft, nontender, nondistended, + BS MS: no deformity or atrophy  Skin: warm and dry, no rash Neuro:  Alert and Oriented x 3, Strength and sensation are intact Psych: euthymic mood, full affect   Labs    High Sensitivity Troponin:   Recent Labs  Lab 11/19/18 2015 11/19/18 2126  TROPONINIHS 72* 75*      Cardiac EnzymesNo results for input(s): TROPONINI in  the last 168 hours. No results for input(s): TROPIPOC in the last 168 hours.   Chemistry Recent Labs  Lab 11/17/18 0449 11/18/18 0431 11/19/18 0222  11/20/18 1527 11/21/18 0525 11/22/18 2216 11/23/18 0553  NA 138 139 139   < > 141 140  --  139  K 3.9 4.2 4.2   < > 3.9 3.8  --  3.6  CL 89* 89* 91*  --   --  94*  --  90*  CO2 37* 40* 42*  --   --  36*  --  36*  GLUCOSE 210* 146* 141*  --   --  153* 199* 130*  BUN _0 --   --  14  --  17  CREATININE 0.90 0.87 0.75  --   --  0.71  --  0.68  CALCIUM 9.0 9.3 9.1  --   --  9.8  --  9.6  PROT 6.5 7.0 6.3*  --   --   --   --   --   ALBUMIN 3.6 3.9 3.5  --   --   --   --   --   AST 34 34 44*  --   --   --   --   --   ALT _1 --   --   --   --   --   ALKPHOS 44 50 47  --   --   --   --   --   BILITOT 0.4 0.7 0.5  --   --   --   --   --   GFRNONAA >60 >60 >60  --   --   >60  --  >60  GFRAA >60 >60 >60  --   --  >60  --  >60  ANIONGAP _2 --   --  10  --  13   < > = values in this interval not displayed.     Hematology Recent Labs  Lab 11/18/18 0431 11/19/18 0222 11/20/18 1525 11/20/18 1527 11/21/18 0525  WBC 6.4 6.4  --   --  6.3  RBC 4.41 4.26  --   --  4.13  HGB 11.8* 11.2* 12.6 12.6 10.9*  HCT 40.4 39.2 37.0 37.0 37.7  MCV 91.6 92.0  --   --  91.3  MCH 26.8 26.3  --   --  26.4  MCHC 29.2* 28.6*  --   --  28.9*  RDW 13.5 13.6  --   --  13.5  PLT 235 223  --   --  224    BNP Recent Labs  Lab 11/18/18 0431  BNP 422.3*     DDimer  Recent Labs  Lab 11/19/18 2015  DDIMER 0.64*     Radiology    No results found.  Cardiac Studies   As described below.  Patient Profile     78 y.o. female hx of significant PVD including high grade bilateral carotid artery disease s/pstaged right followed by left carotid endarterectomy performed by Dr.Brabhamin 2014, left subclavian stentingin 2017, LE arterial disease s/p bilateral common iliac rotational atherectomy PTA and stenting in 2015, HTN, HLD, DM, tobacco abuse, COPD, lung cancer (stage IA squamous cell carcinoma) s/p left upper lobectomy in 2017, chronic home O2(2L/min baseline), CVA, h/o GIB w/ Plavix and HFpEF,who is being seen for the evaluation ofacute CHFat the request of Dr. Nevada Crane, Internal Medicine.  Assessment & Plan    Acute  on chronic diastolic heart failure Creatinine 0.68, continue with IV diuresis.  Will increase Lasix to 80 IV twice daily, perhaps one more day - Minimal nonobstructive coronary artery disease on catheterization.  Pulmonary hypertension with RV failure - Estimated pressures 66 mmHg.  On echo.  Right heart catheterization showed a wedge of 31 (needs good diuresis) and a mean pulmonary artery pressure of 52. -Pulmonary has seen, no vasoactive therapy.  Peripheral vascular disease -Carotid endarterectomy in the past with subclavian stenting.   Bilateral common iliac atherectomy as well in 2015.  Medical management.  Essential hypertension -Norvasc is currently on hold to allow for increased blood pressure for diuresis. No changes  COPD - Prior tobacco use, lung cancer stage Ia squamous cell with lobectomy in 2017.  Chronic home O2. No changes  Prior history of GI bleed with Plavix  Mildly elevated high-sensitivity troponin-72 -Secondary to underlying pulmonary hypertension/lung condition       For questions or updates, please contact [place] Please consult [email or web address] for contact info under        Signed, Candee Furbish, MD  11/23/2018, 11:01 AM

## 2018-11-24 LAB — GLUCOSE, CAPILLARY
Glucose-Capillary: 122 mg/dL — ABNORMAL HIGH (ref 70–99)
Glucose-Capillary: 222 mg/dL — ABNORMAL HIGH (ref 70–99)

## 2018-11-24 MED ORDER — BUDESONIDE 0.25 MG/2ML IN SUSP: 0.2500 mg | Freq: Two times a day (BID) | RESPIRATORY_TRACT | 12 refills | Status: DC

## 2018-11-24 MED ORDER — LEVALBUTEROL HCL 0.63 MG/3ML IN NEBU: 0.6300 mg | INHALATION_SOLUTION | Freq: Three times a day (TID) | RESPIRATORY_TRACT | 12 refills | Status: DC

## 2018-11-24 MED ORDER — METOPROLOL TARTRATE 25 MG PO TABS: 25.0000 mg | ORAL_TABLET | Freq: Two times a day (BID) | ORAL | 0 refills | Status: DC

## 2018-11-24 MED ORDER — FUROSEMIDE 40 MG PO TABS: 40.0000 mg | ORAL_TABLET | Freq: Two times a day (BID) | ORAL | 0 refills | Status: DC

## 2018-11-24 MED ORDER — GABAPENTIN 100 MG PO CAPS: 100.0000 mg | ORAL_CAPSULE | Freq: Three times a day (TID) | ORAL | 0 refills | Status: DC

## 2018-11-24 MED ORDER — LISINOPRIL 2.5 MG PO TABS: 2.5000 mg | ORAL_TABLET | Freq: Every day | ORAL | 0 refills | Status: DC

## 2018-11-24 NOTE — Progress Notes (Signed)
Pt ambulated in the hallway with this nurse. Pt ambulated to the end of the hall and back to her room with rollator and o2. Pt tolerated the walk well.

## 2018-11-24 NOTE — Progress Notes (Signed)
Progress Note  Patient Name: Caitlin Oliver Date of Encounter: 11/24/2018  Primary Cardiologist: Quay Burow, MD   Subjective   Feeling better less short of breath no chest pain, legs are improved.  She is ready to go.  Inpatient Medications    Scheduled Meds: . aspirin  81 mg Oral Daily  . bisacodyl  10 mg Rectal Once  . budesonide (PULMICORT) nebulizer solution  0.25 mg Nebulization BID  . calcium carbonate  1,250 mg Oral QAC supper  . Chlorhexidine Gluconate Cloth  6 each Topical Daily  . Chlorhexidine Gluconate Cloth  6 each Topical Q0600  . clopidogrel  75 mg Oral Daily  . furosemide  80 mg Intravenous BID  . gabapentin  100 mg Oral TID  . heparin  5,000 Units Subcutaneous Q8H  . insulin aspart  0-5 Units Subcutaneous QHS  . insulin aspart  0-9 Units Subcutaneous TID WC  . insulin glargine  8 Units Subcutaneous QHS  . levalbuterol  0.63 mg Nebulization TID  . magnesium oxide  200 mg Oral QAC supper  . mouth rinse  15 mL Mouth Rinse BID  . metoprolol tartrate  25 mg Oral BID WC  . mupirocin ointment  1 application Nasal BID  . omega-3 acid ethyl esters  1 g Oral Daily  . pantoprazole  40 mg Oral BID  . polyethylene glycol  17 g Oral BID  . pravastatin  40 mg Oral QAC supper  . sodium chloride flush  3 mL Intravenous Q12H  . sodium chloride flush  3 mL Intravenous Q12H  . traZODone  50 mg Oral QHS  . umeclidinium-vilanterol  1 puff Inhalation Daily   Continuous Infusions: . sodium chloride    . sodium chloride     PRN Meds: sodium chloride, sodium chloride, acetaminophen, albuterol, diazepam, Glycerin (Adult), HYDROcodone-acetaminophen, meclizine, Muscle Rub, ondansetron (ZOFRAN) IV, ondansetron, sodium chloride flush, sodium chloride flush   Vital Signs    Vitals:   11/24/18 0452 11/24/18 0739 11/24/18 0740 11/24/18 0749  BP:      Pulse:      Resp:      Temp:      TempSrc:      SpO2:  94% 94% 94%  Weight: 78.8 kg     Height:        Intake/Output  Summary (Last 24 hours) at 11/24/2018 0858 Last data filed at 11/24/2018 0757 Gross per 24 hour  Intake 1080 ml  Output 2450 ml  Net -1370 ml   Last 3 Weights 11/24/2018 11/23/2018 11/22/2018  Weight (lbs) 173 lb 12.8 oz 175 lb 4.8 oz (No Data)  Weight (kg) 78.835 kg 79.516 kg (No Data)      Telemetry    Sinus rhythm- Personally Reviewed  ECG    Sinus rhythm- Personally Reviewed  Physical Exam   GEN: No acute distress.   Neck: No JVD Cardiac: RRR, no murmurs, rubs, or gallops.  Respiratory: Clear to auscultation bilaterally. GI: Soft, nontender, non-distended  MS: No edema; No deformity. Neuro:  Nonfocal  Psych: Normal affect   Labs    High Sensitivity Troponin:   Recent Labs  Lab 11/19/18 2015 11/19/18 2126  TROPONINIHS 72* 75*      Cardiac EnzymesNo results for input(s): TROPONINI in the last 168 hours. No results for input(s): TROPIPOC in the last 168 hours.   Chemistry Recent Labs  Lab 11/18/18 0431 11/19/18 0222  11/20/18 1527 11/21/18 0525 11/22/18 2216 11/23/18 0553  NA 139 139   < >  141 140  --  139  K 4.2 4.2   < > 3.9 3.8  --  3.6  CL 89* 91*  --   --  94*  --  90*  CO2 40* 42*  --   --  36*  --  36*  GLUCOSE 146* 141*  --   --  153* 199* 130*  BUN 23 20  --   --  14  --  17  CREATININE 0.87 0.75  --   --  0.71  --  0.68  CALCIUM 9.3 9.1  --   --  9.8  --  9.6  PROT 7.0 6.3*  --   --   --   --   --   ALBUMIN 3.9 3.5  --   --   --   --   --   AST 34 44*  --   --   --   --   --   ALT 19 26  --   --   --   --   --   ALKPHOS 50 47  --   --   --   --   --   BILITOT 0.7 0.5  --   --   --   --   --   GFRNONAA >60 >60  --   --  >60  --  >60  GFRAA >60 >60  --   --  >60  --  >60  ANIONGAP 10 6  --   --  10  --  13   < > = values in this interval not displayed.     Hematology Recent Labs  Lab 11/18/18 0431 11/19/18 0222 11/20/18 1525 11/20/18 1527 11/21/18 0525  WBC 6.4 6.4  --   --  6.3  RBC 4.41 4.26  --   --  4.13  HGB 11.8* 11.2* 12.6  12.6 10.9*  HCT 40.4 39.2 37.0 37.0 37.7  MCV 91.6 92.0  --   --  91.3  MCH 26.8 26.3  --   --  26.4  MCHC 29.2* 28.6*  --   --  28.9*  RDW 13.5 13.6  --   --  13.5  PLT 235 223  --   --  224    BNP Recent Labs  Lab 11/18/18 0431  BNP 422.3*     DDimer  Recent Labs  Lab 11/19/18 2015  DDIMER 0.64*     Radiology    No results found.  Cardiac Studies   Echo- normal left-ventricular ejection fraction 60%-estimated pulmonary artery pressures 66 mmHg, cardiac catheterization mean 52 pulmonary artery, wedge was high at 31.  Patient Profile     78 y.o. female with acute on chronic diastolic heart failure, pulmonary hypertension with PVD hypertension COPD  Assessment & Plan    Acute on chronic diastolic heart failure - Creatinine remains excellent.  Lasix 80 mg IV twice a day effective once again yesterday. - I think we can change her to p.o. Lasix 40 mg p.o. twice daily at home.  This is an increase from her 20 to 40 mg once a day previously. -Check a basic metabolic profile as an outpatient.  Mildly elevated high-sensitivity troponin-72 -Secondary to underlying diastolic heart failure, lung condition, pulmonary hypertension.  Cardiac catheterization showed minimal nonobstructive CAD.  Pulmonary hypertension - Clearly this is contributing to her lower extremity edema.  Reassuring diuresis.  Peripheral vascular disease -Prior carotid endarterectomy with subclavian stenting and bilateral common iliac atherectomy  in 2015.  Continue with aggressive secondary risk factor prevention.  Essential hypertension - Continue to hold Norvasc as an outpatient.  This will allow for continued diuresis if necessary.  COPD - Lung cancer stage I squamous cell lobectomy in 2017, prior tobacco use.  She is on chronic home O2.  History of GI bleed with Plavix.  Monitor for any signs of bleeding.  From my perspective, I will be comfortable with discharge from a cardiology perspective.  We  will establish close follow-up.      For questions or updates, please contact [place] Please consult [email or web address] for contact info under        Signed, Candee Furbish, MD  11/24/2018, 8:58 AM

## 2018-11-24 NOTE — Progress Notes (Signed)
Physical Therapy Treatment Patient Details Name: Caitlin Oliver MRN: [id number] DOB: Jan 24, 1941 Today's Date: 11/24/2018    History of Present Illness 78 y.o. female with admitted with progressive SOB and CHf to [facility] on 7/30. Pt with bil pleural effusion, pulmonary HTN. PMhx: LUL lobectomy due to CA with RLL nodule presently, COPD on home O2, CHF, anxiety, PVD, GERD, HLD, HTN, CVA 3/20.    PT Comments    Patient seen for mobility progression. Pt is making progress toward PT goals and overall requires min guard assist for OOB mobility. Pt tolerated total gait distance of 200 ft with 2 seated rest breaks and stair training. SpO2 90-96% on 2L O2 via [place] throughout session. Current plan remains appropriate.     Follow Up Recommendations  Home health PT;Supervision/Assistance - 24 hour     Equipment Recommendations  None recommended by PT    Recommendations for Other Services       Precautions / Restrictions Precautions Precautions: Fall Precaution Comments: monitor sats    Mobility  Bed Mobility               General bed mobility comments: in chair   Transfers Overall transfer level: Needs assistance Equipment used: (rollator) Transfers: Sit to/from Stand Sit to Stand: Min guard         General transfer comment: min guard for safety  Ambulation/Gait Ambulation/Gait assistance: Min guard Gait Distance (Feet): (200 ft total with 2 seated rest breaks) Assistive device: (rollator) Gait Pattern/deviations: Step-through pattern;Trunk flexed Gait velocity: decreased   General Gait Details: cues for upright posture   Stairs Stairs: Yes Stairs assistance: Min guard Stair Management: One rail Right;Sideways;Forwards Number of Stairs: 6 General stair comments: cues for sequencing and technique   Wheelchair Mobility    Modified Rankin (Stroke Patients Only)       Balance Overall balance assessment: Needs assistance Sitting-balance support: No upper  extremity supported;Feet supported Sitting balance-Leahy Scale: Good     Standing balance support: Bilateral upper extremity supported Standing balance-Leahy Scale: Fair Standing balance comment: pt able to wash hands at sink without UE support                             Cognition Arousal/Alertness: Awake/alert Behavior During Therapy: WFL for tasks assessed/performed Overall Cognitive Status: Within Functional Limits for tasks assessed                                 General Comments: WFL for simple mobility tasks      Exercises      General Comments General comments (skin integrity, edema, etc.): SpO2 90-96% on 2L O2 via [place]       Pertinent Vitals/Pain Pain Assessment: No/denies pain    Home Living                      Prior Function            PT Goals (current goals can now be found in the care plan section) Progress towards PT goals: Progressing toward goals    Frequency    Min 3X/week      PT Plan Current plan remains appropriate    Co-evaluation              AM-PAC PT "6 Clicks" Mobility   Outcome Measure  Help needed turning from your back to your side  while in a flat bed without using bedrails?: None Help needed moving from lying on your back to sitting on the side of a flat bed without using bedrails?: None Help needed moving to and from a bed to a chair (including a wheelchair)?: A Little Help needed standing up from a chair using your arms (e.g., wheelchair or bedside chair)?: A Little Help needed to walk in hospital room?: A Little Help needed climbing 3-5 steps with a railing? : A Little 6 Click Score: 20    End of Session Equipment Utilized During Treatment: Oxygen;Gait belt Activity Tolerance: Patient tolerated treatment well Patient left: in chair;with call bell/phone within reach;with chair alarm set Nurse Communication: Mobility status PT Visit Diagnosis: Unsteadiness on feet (R26.81);Muscle  weakness (generalized) (M62.81);Difficulty in walking, not elsewhere classified (R26.2)     Time: 0321-2248 PT Time Calculation (min) (ACUTE ONLY): 45 min  Charges:  $Gait Training: 23-37 mins                     Earney Navy, PTA Acute Rehabilitation Services Pager: [phone] Office: [phone]     Darliss Cheney 11/24/2018, 1:28 PM

## 2018-11-24 NOTE — Progress Notes (Addendum)
Pt ambulated in the hallway, o2 sat: 92% HR: 95

## 2018-11-24 NOTE — Discharge Summary (Signed)
Physician Discharge Summary  Caitlin Oliver EYC:[id number] DOB: [birth year] DOA: 11/13/2018  PCP: [name], PA-C  Admit date: 11/13/2018 Discharge date: 11/24/2018  Recommendations for Outpatient Follow-up:  1. The patient will follow up with PCP in 7-10 days. 2. Follow up with Cardiology as directed. 3. Follow up with vascular surgery as outpatient for subclavian stenosis.  Follow-up Information    [facility] Follow up on 11/27/2018.   Specialty: Cardiology Why: at 2:00 [place] information: [address] [id number] [place]       [name], PA-C.   Specialty: Family Medicine Contact information: [address] [place] [address] [place] Follow up.   Why: RN, PT, OT, aide           Discharge Diagnoses: Principal diagnosis is #1 1. Acute on chronic diastolic CHF/pulmonary hypertension 2. Peripheral vascular disease with chronic bilateral lower extremity tenderness. 3. Contraction alkalosis 4. Acute on chronic hypoxic respiratory failure in the setting of COPD 5. Left lower extremity cellulitis 6. Hypotension 7. DM II 8. Chronic anxiety and depression  Discharge Condition: Fair Disposition: Home  Diet recommendation: Heart healthy with carbohydrate controlled.  Filed Weights   11/21/18 0618 11/23/18 0519 11/24/18 0452  Weight: 85.2 kg 79.5 kg 78.8 kg    History of present illness:  Caitlin Oliver is a 78 y.o. female with medical history significant of advanced COPD followed by pulmonology, diastolic dysfunction CHF with EF in March of this year showing 60%, anxiety disorder, peripheral vascular disease, diabetes, GERD, hyperlipidemia, hypertension who presented to the ER with progressive shortness of breath leg swelling and cough.  Patient had CVA in March of this year.  She also has had lung  cancer as well as coronary artery disease.  All these have been under control.  She has been on 2 L of oxygen at home since March of this year.  Patient has noticed worsening shortness of breath in the last 2 weeks including oxygen dropping into the 70s saturation.  Also mainly exertional.  Some PND and orthopnea.  She was seen in the outpatient setting was on 20 mg of Lasix which was increased to 40 mg 2 days ago.  She had increased urination but still continues to have more shortness of breath and leg swelling.  She came to the ER where she was evaluated.  Patient has evidence of fluid overload and is being admitted with a diagnosis of acute exacerbation of diastolic CHF.Caitlin Oliver  ED Course: Temperature is 98.4 blood pressure 155/101 pulse 89 respiratory of 26 oxygen sats 92% on room air.  White count 7.4 hemoglobin 11.9 and platelets of 271.  Chemistry appears to be normal except for CO2 of 39 chloride 93.  COVID-19 testing is negative.  Chest x-ray shows small bilateral pleural effusions more on the right.  Cardiomegaly and mild pulmonary vascular congestion.   Hospital Course:  The patient was admitted to a telemetry bed. She was diuresed and cardiology was consulted. The patient was hypotensive and was therefore given IV fluids. She also demonstrated volume contraction. She was placed on an insulin sliding scale for her diabetes, and she was continued on her home medications for her anxiety and depression.  On the evening of 11/19/2018, the patient had an exacerbation of her dyspnea as she returned to her bed from the toilet. This was followed by  pain in her left chest. EKG was performed and was negative for ischemic changes. Troponins were flat. She was given Hydrocodone for pain and metoprolol for elevated heart rate and blood pressures. She was transferred to [facility] from [place].  Echocardiogram 11/14/2018 demosntrated EF >65% with evidence of severe pulmonary hypertension. Pulmonology was  consulted.   The patient underwent right and left heart catheterization on 11/20/2018. It demonstrated: 1. Mild to moderate, non-obstructive coronary artery disease. 2. Severe pulmonary hypertension. 3. Moderately to severely elevated left and right heart filling pressures. 4. Mildly reduced Fick cardiac output/index. 5. Severe right brachiocephalic and subclavian artery stenoses with combined gradient of ~50 mmHg and possible reversal of flow in the right vertebral artery (subclavian steal). Recommendations are for:  1. Medical therapy and risk factor modification to prevent progression of coronary artery disease. 2. Continue gentle diuresis, as blood pressure tolerates. Consultation of advanced heart failure team may be helpful. 3. Continue outpatient vascular follow-up, including monitoring of significant right brachiocephalic/subclavian artery stenoses.  Today's assessment: S: The patient is resting comfortably without new complaints. O: Vitals:  Vitals:   11/24/18 0858 11/24/18 1304  BP: 128/66   Pulse: 99   Resp: 18   Temp: 98.2 F (36.8 C)   SpO2: 93% 96%   Constitutional:   The patient is awake, alert, and oriented x 3. No acute distress. Respiratory:   No increased work of breathing.  No wheezes or rhonchi. Mild rales at bases bilaterally.  No tactile fremitus Cardiovascular:   Regular rate and rhythm  No murmurs, ectopy, or gallups   No lateral PMI. No thrills. Abdomen:   Abdomen is soft, non-tender, non-distended  No hernias, masses, or organomegaly  Normoactive bowel sounds. Musculoskeletal:   No cyanosis or clubbing.  Positive for 2+ pitting edema bilaterally. Skin:   No rashes, lesions, ulcers  palpation of skin: no induration or nodules Neurologic:   CN 2-12 intact  Sensation all 4 extremities intact Psychiatric:   Mental status ? Mood, affect appropriate ? Orientation to person, place, time   judgment and insight appear intact      Discharge Instructions  Discharge Instructions    (HEART FAILURE PATIENTS) Call MD:  Anytime you have any of the following symptoms: 1) 3 pound weight gain in 24 hours or 5 pounds in 1 week 2) shortness of breath, with or without a dry hacking cough 3) swelling in the hands, feet or stomach 4) if you have to sleep on extra pillows at night in order to breathe.   Complete by: As directed    Diet - low sodium heart healthy   Complete by: As directed    Discharge instructions   Complete by: As directed    Follow up with PCP in 7-10 days. Follow up with cardiology as directed.   Heart Failure patients record your daily weight using the same scale at the same time of day   Complete by: As directed    Increase activity slowly   Complete by: As directed    STOP any activity that causes chest pain, shortness of breath, dizziness, sweating, or exessive weakness   Complete by: As directed      Allergies as of 11/24/2018      Reactions   No Known Allergies Other (See Comments)      Medication List    STOP taking these medications   diltiazem 180 MG 24 hr capsule Commonly known as: DILACOR XR     TAKE these medications  albuterol 108 (90 Base) MCG/ACT inhaler Commonly known as: VENTOLIN HFA Inhale 2 puffs into the lungs every 6 (six) hours as needed for wheezing or shortness of breath. What changed: Another medication with the same name was removed. Continue taking this medication, and follow the directions you see here.   aspirin 81 MG chewable tablet Chew by mouth daily.   BIOTIN 5000 PO Take 5,000 mg by mouth daily before supper.   budesonide 0.25 MG/2ML nebulizer solution Commonly known as: PULMICORT Take 2 mLs (0.25 mg total) by nebulization 2 (two) times daily.   Calcium 600 600 MG Tabs tablet Generic drug: calcium carbonate Take 600 mg by mouth daily before supper.   clopidogrel 75 MG tablet Commonly known as: PLAVIX Take 1 tablet (75 mg total) by mouth  daily.   diazepam 2 MG tablet Commonly known as: VALIUM Take 5 mg by mouth every 8 (eight) hours as needed for anxiety.   FISH OIL PO Take 1 capsule by mouth daily.   furosemide 40 MG tablet Commonly known as: LASIX Take 1 tablet (40 mg total) by mouth 2 (two) times daily. What changed:   medication strength  how much to take  when to take this   gabapentin 100 MG capsule Commonly known as: NEURONTIN Take 1 capsule (100 mg total) by mouth 3 (three) times daily.   HYDROcodone-acetaminophen 5-325 MG tablet Commonly known as: NORCO/VICODIN Take 1 tablet by mouth every 12 (twelve) hours as needed for moderate pain.   ICY HOT EX Apply 1 spray topically every 8 (eight) hours as needed (pain).   insulin aspart 100 UNIT/ML injection Commonly known as: NovoLOG Substitute to any brand approved.Before each meal 3 times a day, 140-199 - 2 units, 200-250 - 4 units, 251-299 - 6 units,  300-349 - 8 units,  350 or above 10 units. Dispense syringes and needles as needed, Ok to switch to PEN if approved. DX DM2, Code E11.65   insulin glargine 100 UNIT/ML injection Commonly known as: Lantus Inject 0.08 mLs (8 Units total) into the skin at bedtime. Dispense insulin pen if approved, if not dispense as needed syringes and needles for 1 month supply. Can switch to Levemir. Diagnosis E 11.65.   levalbuterol 0.63 MG/3ML nebulizer solution Commonly known as: XOPENEX Take 3 mLs (0.63 mg total) by nebulization 3 (three) times daily.   lisinopril 2.5 MG tablet Commonly known as: ZESTRIL Take 1 tablet (2.5 mg total) by mouth daily. What changed:   medication strength  how much to take   MAGNESIUM PO Take 1 tablet by mouth daily before supper.   meclizine 12.5 MG tablet Commonly known as: ANTIVERT Take 12.5 mg by mouth 3 (three) times daily as needed for dizziness or nausea.   metFORMIN 500 MG tablet Commonly known as: GLUCOPHAGE Take by mouth 2 (two) times daily with a meal.    metoprolol tartrate 25 MG tablet Commonly known as: LOPRESSOR Take 1 tablet (25 mg total) by mouth 2 (two) times daily with a meal. What changed:   medication strength  how much to take   ondansetron 4 MG tablet Commonly known as: ZOFRAN Take 4 mg by mouth at bedtime as needed for nausea or vomiting.   ONE TOUCH ULTRA TEST test strip Generic drug: glucose blood   pantoprazole 40 MG tablet Commonly known as: PROTONIX Take 40 mg by mouth 2 (two) times daily.   pravastatin 40 MG tablet Commonly known as: PRAVACHOL Take 40 mg by mouth daily before supper.  traZODone 50 MG tablet Commonly known as: DESYREL Take 50 mg by mouth at bedtime.   Trelegy Ellipta 100-62.5-25 MCG/INH Aepb Generic drug: Fluticasone-Umeclidin-Vilant Inhale 1 puff into the lungs daily.      Allergies  Allergen Reactions   No Known Allergies Other (See Comments)    The results of significant diagnostics from this hospitalization (including imaging, microbiology, ancillary and laboratory) are listed below for reference.    Significant Diagnostic Studies: Dg Chest 2 View  Result Date: 11/13/2018 CLINICAL DATA:  Acute shortness of breath EXAM: CHEST - 2 VIEW COMPARISON:  06/18/2018 CT, 05/13/2018 chest radiograph and prior studies FINDINGS: Cardiomegaly and mild pulmonary vascular congestion noted. Small bilateral pleural effusions are noted, increased on the RIGHT and stable on the LEFT since 06/18/2018. Mild bibasilar atelectasis noted. No pneumothorax or acute bony abnormality. IMPRESSION: 1. Small bilateral pleural effusions, increased on the RIGHT and stable on the LEFT since 06/18/2018. Mild bibasilar atelectasis. 2. Cardiomegaly and mild pulmonary vascular congestion. Electronically Signed   By: Margarette Canada M.D.   On: 11/13/2018 16:23   Dg Chest Port 1 View  Result Date: 11/19/2018 CLINICAL DATA:  Shortness of breath EXAM: PORTABLE CHEST 1 VIEW COMPARISON:  11/13/2018 FINDINGS: Stable  cardiomediastinal contours. Calcific aortic knob. Small bilateral pleural effusions, stable on the left, decreased on the right. Mild streaky left basilar opacity. No pneumothorax. IMPRESSION: 1. Small bilateral pleural effusions, slightly decreased on the right. 2. Mild streaky left basilar opacity suggesting atelectasis. Electronically Signed   By: Davina Poke M.D.   On: 11/19/2018 09:55    Microbiology: Recent Results (from the past 240 hour(s))  MRSA PCR Screening     Status: Abnormal   Collection Time: 11/20/18 11:35 AM   Specimen: Nasopharyngeal  Result Value Ref Range Status   MRSA by PCR POSITIVE (A) NEGATIVE Final    Comment:        The GeneXpert MRSA Assay (FDA approved for NASAL specimens only), is one component of a comprehensive MRSA colonization surveillance program. It is not intended to diagnose MRSA infection nor to guide or monitor treatment for MRSA infections. RESULT CALLED TO, READ BACK BY AND VERIFIED WITH: Su Grand RN 14:35 11/20/18 ([email or web address]) Performed at [facility], [place] [address] [place], [place] [address]      Labs: Basic Metabolic Panel: Recent Labs  Lab 11/18/18 0431 11/19/18 0222 11/20/18 1525 11/20/18 1527 11/21/18 0525 11/22/18 2216 11/23/18 0553  NA 139 139 142 141 140  --  139  K 4.2 4.2 3.9 3.9 3.8  --  3.6  CL 89* 91*  --   --  94*  --  90*  CO2 40* 42*  --   --  36*  --  36*  GLUCOSE 146* 141*  --   --  153* 199* 130*  BUN 23 20  --   --  14  --  17  CREATININE 0.87 0.75  --   --  0.71  --  0.68  CALCIUM 9.3 9.1  --   --  9.8  --  9.6  MG 2.1 2.2  --   --   --   --   --    Liver Function Tests: Recent Labs  Lab 11/18/18 0431 11/19/18 0222  AST 34 44*  ALT 19 26  ALKPHOS 50 47  BILITOT 0.7 0.5  PROT 7.0 6.3*  ALBUMIN 3.9 3.5   No results for input(s): LIPASE, AMYLASE in the last 168 hours. No results for  input(s): AMMONIA in the last 168 hours. CBC: Recent Labs  Lab 11/18/18 0431 11/19/18 0222 11/20/18 1525  11/20/18 1527 11/21/18 0525  WBC 6.4 6.4  --   --  6.3  NEUTROABS  --   --   --   --  4.2  HGB 11.8* 11.2* 12.6 12.6 10.9*  HCT 40.4 39.2 37.0 37.0 37.7  MCV 91.6 92.0  --   --  91.3  PLT 235 223  --   --  224   Cardiac Enzymes: No results for input(s): CKTOTAL, CKMB, CKMBINDEX, TROPONINI in the last 168 hours. BNP: BNP (last 3 results) Recent Labs    11/14/18 0431 11/18/18 0431  BNP 528.4* 422.3*    ProBNP (last 3 results) Recent Labs    11/13/18 1434  PROBNP 1,147.0*    CBG: Recent Labs  Lab 11/23/18 1158 11/23/18 1605 11/23/18 2154 11/24/18 0630 11/24/18 1221  GLUCAP 149* 200* 261* 122* 222*    Principal Problem:   Acute on chronic combined systolic and diastolic CHF (congestive heart failure) (HCC) Active Problems:   Essential hypertension   Type 2 diabetes mellitus (HCC)   PVD (peripheral vascular disease) with claudication (HCC)   Carotid artery obstruction   COPD GOLD II   Cancer of left lung (HCC)   Acute on chronic heart failure with preserved ejection fraction (HFpEF) ([place])   Unstable angina ([place])   Time coordinating discharge: 38 minutes.  Signed:        Bralyn Espino, DO [facility]  11/24/2018, 5:03 PM

## 2018-11-24 NOTE — Plan of Care (Signed)
  Problem: Education: Goal: Ability to demonstrate management of disease process will improve Outcome: Progressing Goal: Ability to verbalize understanding of medication therapies will improve Outcome: Progressing Goal: Individualized Educational Video(s) Outcome: Progressing   Problem: Education: Goal: Knowledge of disease or condition will improve Outcome: Progressing Goal: Understanding of medication regimen will improve Outcome: Progressing Goal: Individualized Educational Video(s) Outcome: Progressing

## 2018-11-24 NOTE — TOC Transition Note (Signed)
Transition of Care [facility]) - CM/SW Discharge Note   Patient Details  Name: Caitlin Oliver MRN: [id number] Date of Birth: 05/10/1940  [facility]) CM/SW Contact:  Zenon Mayo, RN Phone Number: 11/24/2018, 12:54 PM   Clinical Narrative:    Patient is for discharge today, she is active with [facility] for RN, PT, OT, RN, Aide.  NCM contacted [facility] to inform of dc date.  Soc is 24 to 48 hrs post dc.    Final next level of care: [place] Barriers to Discharge: No Barriers Identified   Patient Goals and CMS Choice Patient states their goals for this hospitalization and ongoing recovery are:: get better CMS Medicare.gov Compare Post Acute Care list provided to:: Patient Choice offered to / list presented to : Patient  Discharge Placement                       Discharge Plan and Services In-house Referral: Clinical Social Work Discharge Planning Services: CM Consult Post Acute Care Choice: Home Health          DME Arranged: (NA) DME Agency: NA       HH Arranged: RN, PT, OT, Nurse's Aide [place] Agency: [place] ([facility]) Date HH Agency Contacted: 11/24/18 Time [place]: 1254 Representative spoke with at [place] ([place]) Interventions     Readmission Risk Interventions Readmission Risk Prevention Plan 11/17/2018  Transportation Screening Complete  PCP or Specialist Appt within 3-5 Days Not Complete  Not Complete comments not ready for dc  HRI or [place] Complete  Social Work Consult for [place] Planning/Counseling Complete  Palliative Care Screening Not Applicable  Medication Review Press photographer) Patient Refused  Some recent data might be hidden

## 2018-11-25 ENCOUNTER — Telehealth (HOSPITAL_COMMUNITY): Payer: Self-pay | Admitting: Adult Health

## 2018-11-25 NOTE — Telephone Encounter (Signed)
Per call from patient's niece, [name], patient's discharge was delayed.  Therefore, the niece requested to move patient's appt to wk after next.  I offered to get pt in on 12/04/2018 or 12/05/2018 and pt's niece declined.    She stated pt is seeing her PCP next wk on 12/02/2018.  Pt rescheduled to 12/10/2018 _0 :00 am.

## 2018-11-25 NOTE — Telephone Encounter (Signed)
We can hold off if doing better

## 2018-11-25 NOTE — Telephone Encounter (Signed)
Beth - please advise if the pt still needs to have an ONO done. Thanks!

## 2018-11-26 DIAGNOSIS — J449 Chronic obstructive pulmonary disease, unspecified: Secondary | ICD-10-CM | POA: Diagnosis not present

## 2018-11-26 DIAGNOSIS — Z7982 Long term (current) use of aspirin: Secondary | ICD-10-CM | POA: Diagnosis not present

## 2018-11-26 DIAGNOSIS — E7800 Pure hypercholesterolemia, unspecified: Secondary | ICD-10-CM | POA: Diagnosis not present

## 2018-11-26 DIAGNOSIS — Z7984 Long term (current) use of oral hypoglycemic drugs: Secondary | ICD-10-CM | POA: Diagnosis not present

## 2018-11-26 DIAGNOSIS — I771 Stricture of artery: Secondary | ICD-10-CM | POA: Diagnosis not present

## 2018-11-26 DIAGNOSIS — E1151 Type 2 diabetes mellitus with diabetic peripheral angiopathy without gangrene: Secondary | ICD-10-CM | POA: Diagnosis not present

## 2018-11-26 DIAGNOSIS — Z87891 Personal history of nicotine dependence: Secondary | ICD-10-CM | POA: Diagnosis not present

## 2018-11-26 DIAGNOSIS — K219 Gastro-esophageal reflux disease without esophagitis: Secondary | ICD-10-CM | POA: Diagnosis not present

## 2018-11-26 DIAGNOSIS — I110 Hypertensive heart disease with heart failure: Secondary | ICD-10-CM | POA: Diagnosis not present

## 2018-11-26 DIAGNOSIS — F329 Major depressive disorder, single episode, unspecified: Secondary | ICD-10-CM | POA: Diagnosis not present

## 2018-11-26 DIAGNOSIS — I2510 Atherosclerotic heart disease of native coronary artery without angina pectoris: Secondary | ICD-10-CM | POA: Diagnosis not present

## 2018-11-26 DIAGNOSIS — Z87440 Personal history of urinary (tract) infections: Secondary | ICD-10-CM | POA: Diagnosis not present

## 2018-11-26 DIAGNOSIS — C3492 Malignant neoplasm of unspecified part of left bronchus or lung: Secondary | ICD-10-CM | POA: Diagnosis not present

## 2018-11-26 DIAGNOSIS — I6523 Occlusion and stenosis of bilateral carotid arteries: Secondary | ICD-10-CM | POA: Diagnosis not present

## 2018-11-26 DIAGNOSIS — G43909 Migraine, unspecified, not intractable, without status migrainosus: Secondary | ICD-10-CM | POA: Diagnosis not present

## 2018-11-26 DIAGNOSIS — Z9181 History of falling: Secondary | ICD-10-CM | POA: Diagnosis not present

## 2018-11-26 DIAGNOSIS — I5032 Chronic diastolic (congestive) heart failure: Secondary | ICD-10-CM | POA: Diagnosis not present

## 2018-11-26 DIAGNOSIS — I272 Pulmonary hypertension, unspecified: Secondary | ICD-10-CM | POA: Diagnosis not present

## 2018-11-26 DIAGNOSIS — G8220 Paraplegia, unspecified: Secondary | ICD-10-CM | POA: Diagnosis not present

## 2018-11-26 DIAGNOSIS — Z7902 Long term (current) use of antithrombotics/antiplatelets: Secondary | ICD-10-CM | POA: Diagnosis not present

## 2018-11-27 ENCOUNTER — Inpatient Hospital Stay (HOSPITAL_COMMUNITY): Payer: PPO

## 2018-11-28 DIAGNOSIS — I272 Pulmonary hypertension, unspecified: Secondary | ICD-10-CM | POA: Diagnosis not present

## 2018-11-28 DIAGNOSIS — J449 Chronic obstructive pulmonary disease, unspecified: Secondary | ICD-10-CM | POA: Diagnosis not present

## 2018-11-28 DIAGNOSIS — I69398 Other sequelae of cerebral infarction: Secondary | ICD-10-CM | POA: Diagnosis not present

## 2018-11-28 DIAGNOSIS — Z85118 Personal history of other malignant neoplasm of bronchus and lung: Secondary | ICD-10-CM | POA: Diagnosis not present

## 2018-12-01 DIAGNOSIS — Z7984 Long term (current) use of oral hypoglycemic drugs: Secondary | ICD-10-CM | POA: Diagnosis not present

## 2018-12-01 DIAGNOSIS — Z7902 Long term (current) use of antithrombotics/antiplatelets: Secondary | ICD-10-CM | POA: Diagnosis not present

## 2018-12-01 DIAGNOSIS — G8220 Paraplegia, unspecified: Secondary | ICD-10-CM | POA: Diagnosis not present

## 2018-12-01 DIAGNOSIS — I2510 Atherosclerotic heart disease of native coronary artery without angina pectoris: Secondary | ICD-10-CM | POA: Diagnosis not present

## 2018-12-01 DIAGNOSIS — C3492 Malignant neoplasm of unspecified part of left bronchus or lung: Secondary | ICD-10-CM | POA: Diagnosis not present

## 2018-12-01 DIAGNOSIS — I5033 Acute on chronic diastolic (congestive) heart failure: Secondary | ICD-10-CM | POA: Diagnosis not present

## 2018-12-01 DIAGNOSIS — F329 Major depressive disorder, single episode, unspecified: Secondary | ICD-10-CM | POA: Diagnosis not present

## 2018-12-01 DIAGNOSIS — J449 Chronic obstructive pulmonary disease, unspecified: Secondary | ICD-10-CM | POA: Diagnosis not present

## 2018-12-01 DIAGNOSIS — E1151 Type 2 diabetes mellitus with diabetic peripheral angiopathy without gangrene: Secondary | ICD-10-CM | POA: Diagnosis not present

## 2018-12-01 DIAGNOSIS — I272 Pulmonary hypertension, unspecified: Secondary | ICD-10-CM | POA: Diagnosis not present

## 2018-12-01 DIAGNOSIS — I5032 Chronic diastolic (congestive) heart failure: Secondary | ICD-10-CM | POA: Diagnosis not present

## 2018-12-01 DIAGNOSIS — Z87891 Personal history of nicotine dependence: Secondary | ICD-10-CM | POA: Diagnosis not present

## 2018-12-01 DIAGNOSIS — Z87440 Personal history of urinary (tract) infections: Secondary | ICD-10-CM | POA: Diagnosis not present

## 2018-12-01 DIAGNOSIS — K219 Gastro-esophageal reflux disease without esophagitis: Secondary | ICD-10-CM | POA: Diagnosis not present

## 2018-12-01 DIAGNOSIS — I110 Hypertensive heart disease with heart failure: Secondary | ICD-10-CM | POA: Diagnosis not present

## 2018-12-01 DIAGNOSIS — G43909 Migraine, unspecified, not intractable, without status migrainosus: Secondary | ICD-10-CM | POA: Diagnosis not present

## 2018-12-01 DIAGNOSIS — E7800 Pure hypercholesterolemia, unspecified: Secondary | ICD-10-CM | POA: Diagnosis not present

## 2018-12-01 DIAGNOSIS — I6523 Occlusion and stenosis of bilateral carotid arteries: Secondary | ICD-10-CM | POA: Diagnosis not present

## 2018-12-01 DIAGNOSIS — Z7982 Long term (current) use of aspirin: Secondary | ICD-10-CM | POA: Diagnosis not present

## 2018-12-01 DIAGNOSIS — Z9181 History of falling: Secondary | ICD-10-CM | POA: Diagnosis not present

## 2018-12-02 DIAGNOSIS — J449 Chronic obstructive pulmonary disease, unspecified: Secondary | ICD-10-CM | POA: Diagnosis not present

## 2018-12-02 DIAGNOSIS — I110 Hypertensive heart disease with heart failure: Secondary | ICD-10-CM | POA: Diagnosis not present

## 2018-12-02 DIAGNOSIS — K5903 Drug induced constipation: Secondary | ICD-10-CM | POA: Diagnosis not present

## 2018-12-02 DIAGNOSIS — R0781 Pleurodynia: Secondary | ICD-10-CM | POA: Diagnosis not present

## 2018-12-02 DIAGNOSIS — M7989 Other specified soft tissue disorders: Secondary | ICD-10-CM | POA: Diagnosis not present

## 2018-12-02 DIAGNOSIS — I5032 Chronic diastolic (congestive) heart failure: Secondary | ICD-10-CM | POA: Diagnosis not present

## 2018-12-02 DIAGNOSIS — R42 Dizziness and giddiness: Secondary | ICD-10-CM | POA: Diagnosis not present

## 2018-12-05 ENCOUNTER — Telehealth (HOSPITAL_COMMUNITY): Payer: Self-pay | Admitting: *Deleted

## 2018-12-05 DIAGNOSIS — I110 Hypertensive heart disease with heart failure: Secondary | ICD-10-CM | POA: Diagnosis not present

## 2018-12-05 DIAGNOSIS — Z9181 History of falling: Secondary | ICD-10-CM | POA: Diagnosis not present

## 2018-12-05 DIAGNOSIS — J449 Chronic obstructive pulmonary disease, unspecified: Secondary | ICD-10-CM | POA: Diagnosis not present

## 2018-12-05 DIAGNOSIS — G43909 Migraine, unspecified, not intractable, without status migrainosus: Secondary | ICD-10-CM | POA: Diagnosis not present

## 2018-12-05 DIAGNOSIS — Z7982 Long term (current) use of aspirin: Secondary | ICD-10-CM | POA: Diagnosis not present

## 2018-12-05 DIAGNOSIS — Z7902 Long term (current) use of antithrombotics/antiplatelets: Secondary | ICD-10-CM | POA: Diagnosis not present

## 2018-12-05 DIAGNOSIS — E7800 Pure hypercholesterolemia, unspecified: Secondary | ICD-10-CM | POA: Diagnosis not present

## 2018-12-05 DIAGNOSIS — F329 Major depressive disorder, single episode, unspecified: Secondary | ICD-10-CM | POA: Diagnosis not present

## 2018-12-05 DIAGNOSIS — C3492 Malignant neoplasm of unspecified part of left bronchus or lung: Secondary | ICD-10-CM | POA: Diagnosis not present

## 2018-12-05 DIAGNOSIS — I6523 Occlusion and stenosis of bilateral carotid arteries: Secondary | ICD-10-CM | POA: Diagnosis not present

## 2018-12-05 DIAGNOSIS — Z87891 Personal history of nicotine dependence: Secondary | ICD-10-CM | POA: Diagnosis not present

## 2018-12-05 DIAGNOSIS — K219 Gastro-esophageal reflux disease without esophagitis: Secondary | ICD-10-CM | POA: Diagnosis not present

## 2018-12-05 DIAGNOSIS — G8220 Paraplegia, unspecified: Secondary | ICD-10-CM | POA: Diagnosis not present

## 2018-12-05 DIAGNOSIS — I5032 Chronic diastolic (congestive) heart failure: Secondary | ICD-10-CM | POA: Diagnosis not present

## 2018-12-05 DIAGNOSIS — I272 Pulmonary hypertension, unspecified: Secondary | ICD-10-CM | POA: Diagnosis not present

## 2018-12-05 DIAGNOSIS — Z7984 Long term (current) use of oral hypoglycemic drugs: Secondary | ICD-10-CM | POA: Diagnosis not present

## 2018-12-05 DIAGNOSIS — Z87440 Personal history of urinary (tract) infections: Secondary | ICD-10-CM | POA: Diagnosis not present

## 2018-12-05 DIAGNOSIS — E1151 Type 2 diabetes mellitus with diabetic peripheral angiopathy without gangrene: Secondary | ICD-10-CM | POA: Diagnosis not present

## 2018-12-05 NOTE — Telephone Encounter (Signed)
Pt called to report her wt is up 3 lbs today from yesterday.  She states prior to today her wt has been very stable since she was d/c'd from hospital.  She denies any increased SOB, does mention her feet are slightly swollen but states she has not propped them up today.  She states her niece [name] fixes her pill box for her and she just takes them but she knows she takes the Lasix 40 mg twice a day.  We discussed salt/fluid restrictions, seems she is following this pretty well.  Advised she could take an extra Lasix today to see if that will help get wt down.  She ask that I call her niece [name] and let her know.  Called and spoke w/[name], she is aware and agreeable and states she will go and check on pt and give extra Lasix today, pt has appt on Wed 8/26, she will keep that appt and will [place] before then if wt continues to increase

## 2018-12-08 DIAGNOSIS — Z7984 Long term (current) use of oral hypoglycemic drugs: Secondary | ICD-10-CM | POA: Diagnosis not present

## 2018-12-08 DIAGNOSIS — C3492 Malignant neoplasm of unspecified part of left bronchus or lung: Secondary | ICD-10-CM | POA: Diagnosis not present

## 2018-12-08 DIAGNOSIS — Z9181 History of falling: Secondary | ICD-10-CM | POA: Diagnosis not present

## 2018-12-08 DIAGNOSIS — E1151 Type 2 diabetes mellitus with diabetic peripheral angiopathy without gangrene: Secondary | ICD-10-CM | POA: Diagnosis not present

## 2018-12-08 DIAGNOSIS — G8220 Paraplegia, unspecified: Secondary | ICD-10-CM | POA: Diagnosis not present

## 2018-12-08 DIAGNOSIS — I5032 Chronic diastolic (congestive) heart failure: Secondary | ICD-10-CM | POA: Diagnosis not present

## 2018-12-08 DIAGNOSIS — F329 Major depressive disorder, single episode, unspecified: Secondary | ICD-10-CM | POA: Diagnosis not present

## 2018-12-08 DIAGNOSIS — Z7982 Long term (current) use of aspirin: Secondary | ICD-10-CM | POA: Diagnosis not present

## 2018-12-08 DIAGNOSIS — Z87891 Personal history of nicotine dependence: Secondary | ICD-10-CM | POA: Diagnosis not present

## 2018-12-08 DIAGNOSIS — I272 Pulmonary hypertension, unspecified: Secondary | ICD-10-CM | POA: Diagnosis not present

## 2018-12-08 DIAGNOSIS — Z87440 Personal history of urinary (tract) infections: Secondary | ICD-10-CM | POA: Diagnosis not present

## 2018-12-08 DIAGNOSIS — I110 Hypertensive heart disease with heart failure: Secondary | ICD-10-CM | POA: Diagnosis not present

## 2018-12-08 DIAGNOSIS — Z7902 Long term (current) use of antithrombotics/antiplatelets: Secondary | ICD-10-CM | POA: Diagnosis not present

## 2018-12-08 DIAGNOSIS — E7800 Pure hypercholesterolemia, unspecified: Secondary | ICD-10-CM | POA: Diagnosis not present

## 2018-12-08 DIAGNOSIS — G43909 Migraine, unspecified, not intractable, without status migrainosus: Secondary | ICD-10-CM | POA: Diagnosis not present

## 2018-12-08 DIAGNOSIS — J449 Chronic obstructive pulmonary disease, unspecified: Secondary | ICD-10-CM | POA: Diagnosis not present

## 2018-12-08 DIAGNOSIS — I6523 Occlusion and stenosis of bilateral carotid arteries: Secondary | ICD-10-CM | POA: Diagnosis not present

## 2018-12-08 DIAGNOSIS — K219 Gastro-esophageal reflux disease without esophagitis: Secondary | ICD-10-CM | POA: Diagnosis not present

## 2018-12-10 ENCOUNTER — Telehealth: Payer: Self-pay | Admitting: Primary Care

## 2018-12-10 ENCOUNTER — Inpatient Hospital Stay (HOSPITAL_COMMUNITY): Payer: PPO

## 2018-12-10 NOTE — Telephone Encounter (Signed)
Primary Pulmonologist: Wert  Last office visit and with whom: Beth NP on 11/13/2018 (last seen by MW in 2018) What do we see them for (pulmonary problems): COPD, SHOB Last OV assessment/plan: Assessment and Plan:   Shortness of breath/Dyspnea - Worsening sob x 1 month - Continue lasix 26m daily until testing resulted  - Needs CXR to evaluate previous pleural effusion - Covid testing and labs (bnp, cbc and bmet) - Needs to follow up with cardiology    COPD - Clinical symptoms not consistent with COPD exacerbation - Stiolto not covered - Trial Trelegy 1 puff daily (sample given) - Add albuterol nebulizer q 6 hours prn sob - DME referral for incentive spirometer and new nebulizer machine    Acute on chronic respiratory failure - Increased somnolence - O2 88-90% 2L; desaturating to 50s at times  - Titrate oxygen 2-4 L to keep sat >88% - Strongly advised ED evaluation by EMS but patient declined  - Family understands if breathing symptoms worsen, O2 sustaining <88% or patient becomes more somnolent to call EMS    Somnolence: - Check ONO on 2L - May needs sleep study in the future - Checking BMET to evaluate CO2 - may need hospital admission for BIPAP      Follow Up Instructions:    5 days with Dr. WMelvyn Novasor NP   I discussed the assessment and treatment plan with the patient. The patient was provided an opportunity to ask questions and all were answered. The patient agreed with the plan and demonstrated an understanding of the instructions.   The patient was advised to call back or seek an in-person evaluation if the symptoms worsen or if the condition fails to improve as anticipated.   Was appointment offered to patient (explain)?  Patient niece KMaudie Mercurycalled on patient's behalf and was unable to make appt without her present.  Did inform KMaudie Mercurythat appt may be necessary to assess -Eustaquio MaizeNP will make that call.   Reason for call: called spoke with patient's niece KMaudie Mercurywho reported that  patient was rx'd Xopenex neb at hospital discharge from 7/30 [facility].  Patient did take Xopenex treatments in the hospital and did have nausea in the hospital but was unable to link the 2 together until she began the treatments at home - nausea would begin as soon as she began the treatment and subsides when she is done.  Patient was instructed to take Xopenex three times daily scheduled - has not taken a dose in 2 days and nausea has been eradicated. Patient was taking deep breaths during treatment (wears a mask with nebulizer).  KMaudie Mercuryis concerned that patient may be taking too many medications and would like to know if the Xopenex can be d/c'd >> Xopenex TID, Pulmicort BID, Trelegy QD, Albuterol HFA PRN (Maudie Mercurysays this is put away in a cabinet).  Patient saturations are doing well at 95-96% on 2L.  Patient went to another physician appt earlier this week, didn't realize her O2 was off and saturations were 90-91%.  Beth NP please advise if Xopenex neb may be d/c'd or if you recommend an appt to further discuss.

## 2018-12-10 NOTE — Telephone Encounter (Signed)
Yes xopenex can be discontinued. Set up visit with Dr. Melvyn Novas next available

## 2018-12-10 NOTE — Telephone Encounter (Signed)
Called spoke with patient's niece [name] and discussed Beth NP recommendations.  [name] voiced her understanding, reported that she will communicate with patient and call back to schedule appt with Dr. Melvyn Novas.  Nothing further needed at this time; will sign off.

## 2018-12-10 NOTE — Telephone Encounter (Signed)
LMOM TCB x1 to speak with patient to discuss symptoms

## 2018-12-17 DIAGNOSIS — I2510 Atherosclerotic heart disease of native coronary artery without angina pectoris: Secondary | ICD-10-CM | POA: Diagnosis not present

## 2018-12-17 DIAGNOSIS — C3492 Malignant neoplasm of unspecified part of left bronchus or lung: Secondary | ICD-10-CM | POA: Diagnosis not present

## 2018-12-17 DIAGNOSIS — I6523 Occlusion and stenosis of bilateral carotid arteries: Secondary | ICD-10-CM | POA: Diagnosis not present

## 2018-12-17 DIAGNOSIS — Z7902 Long term (current) use of antithrombotics/antiplatelets: Secondary | ICD-10-CM | POA: Diagnosis not present

## 2018-12-17 DIAGNOSIS — I110 Hypertensive heart disease with heart failure: Secondary | ICD-10-CM | POA: Diagnosis not present

## 2018-12-17 DIAGNOSIS — F329 Major depressive disorder, single episode, unspecified: Secondary | ICD-10-CM | POA: Diagnosis not present

## 2018-12-17 DIAGNOSIS — E7800 Pure hypercholesterolemia, unspecified: Secondary | ICD-10-CM | POA: Diagnosis not present

## 2018-12-17 DIAGNOSIS — Z87440 Personal history of urinary (tract) infections: Secondary | ICD-10-CM | POA: Diagnosis not present

## 2018-12-17 DIAGNOSIS — I272 Pulmonary hypertension, unspecified: Secondary | ICD-10-CM | POA: Diagnosis not present

## 2018-12-17 DIAGNOSIS — I5033 Acute on chronic diastolic (congestive) heart failure: Secondary | ICD-10-CM | POA: Diagnosis not present

## 2018-12-17 DIAGNOSIS — G8220 Paraplegia, unspecified: Secondary | ICD-10-CM | POA: Diagnosis not present

## 2018-12-17 DIAGNOSIS — Z7982 Long term (current) use of aspirin: Secondary | ICD-10-CM | POA: Diagnosis not present

## 2018-12-17 DIAGNOSIS — Z9181 History of falling: Secondary | ICD-10-CM | POA: Diagnosis not present

## 2018-12-17 DIAGNOSIS — K219 Gastro-esophageal reflux disease without esophagitis: Secondary | ICD-10-CM | POA: Diagnosis not present

## 2018-12-17 DIAGNOSIS — J449 Chronic obstructive pulmonary disease, unspecified: Secondary | ICD-10-CM | POA: Diagnosis not present

## 2018-12-17 DIAGNOSIS — E1151 Type 2 diabetes mellitus with diabetic peripheral angiopathy without gangrene: Secondary | ICD-10-CM | POA: Diagnosis not present

## 2018-12-17 DIAGNOSIS — Z87891 Personal history of nicotine dependence: Secondary | ICD-10-CM | POA: Diagnosis not present

## 2018-12-17 DIAGNOSIS — G43909 Migraine, unspecified, not intractable, without status migrainosus: Secondary | ICD-10-CM | POA: Diagnosis not present

## 2018-12-17 DIAGNOSIS — Z7984 Long term (current) use of oral hypoglycemic drugs: Secondary | ICD-10-CM | POA: Diagnosis not present

## 2018-12-19 ENCOUNTER — Inpatient Hospital Stay (HOSPITAL_COMMUNITY): Payer: PPO

## 2018-12-23 DIAGNOSIS — R1030 Lower abdominal pain, unspecified: Secondary | ICD-10-CM | POA: Diagnosis not present

## 2018-12-23 DIAGNOSIS — R1084 Generalized abdominal pain: Secondary | ICD-10-CM | POA: Diagnosis not present

## 2018-12-29 DIAGNOSIS — Z85118 Personal history of other malignant neoplasm of bronchus and lung: Secondary | ICD-10-CM | POA: Diagnosis not present

## 2018-12-29 DIAGNOSIS — I272 Pulmonary hypertension, unspecified: Secondary | ICD-10-CM | POA: Diagnosis not present

## 2018-12-29 DIAGNOSIS — I69398 Other sequelae of cerebral infarction: Secondary | ICD-10-CM | POA: Diagnosis not present

## 2018-12-29 DIAGNOSIS — J449 Chronic obstructive pulmonary disease, unspecified: Secondary | ICD-10-CM | POA: Diagnosis not present

## 2019-01-02 ENCOUNTER — Ambulatory Visit (HOSPITAL_COMMUNITY)
Admission: RE | Admit: 2019-01-02 | Discharge: 2019-01-02 | Disposition: A | Discharge status: Home / Self Care | Payer: PPO | Source: Ambulatory Visit | Attending: Adult Health | Admitting: Adult Health

## 2019-01-02 ENCOUNTER — Other Ambulatory Visit: Payer: Self-pay

## 2019-01-02 ENCOUNTER — Encounter (HOSPITAL_COMMUNITY): Payer: Self-pay

## 2019-01-02 VITALS — BP 110/60 | HR 85 | Wt 174.4 lb

## 2019-01-02 DIAGNOSIS — Z8711 Personal history of peptic ulcer disease: Secondary | ICD-10-CM | POA: Diagnosis not present

## 2019-01-02 DIAGNOSIS — J9610 Chronic respiratory failure, unspecified whether with hypoxia or hypercapnia: Secondary | ICD-10-CM | POA: Insufficient documentation

## 2019-01-02 DIAGNOSIS — E1151 Type 2 diabetes mellitus with diabetic peripheral angiopathy without gangrene: Secondary | ICD-10-CM | POA: Insufficient documentation

## 2019-01-02 DIAGNOSIS — Z8673 Personal history of transient ischemic attack (TIA), and cerebral infarction without residual deficits: Secondary | ICD-10-CM | POA: Diagnosis not present

## 2019-01-02 DIAGNOSIS — Z823 Family history of stroke: Secondary | ICD-10-CM | POA: Diagnosis not present

## 2019-01-02 DIAGNOSIS — Z7951 Long term (current) use of inhaled steroids: Secondary | ICD-10-CM | POA: Diagnosis not present

## 2019-01-02 DIAGNOSIS — Z841 Family history of disorders of kidney and ureter: Secondary | ICD-10-CM | POA: Insufficient documentation

## 2019-01-02 DIAGNOSIS — Z8249 Family history of ischemic heart disease and other diseases of the circulatory system: Secondary | ICD-10-CM | POA: Insufficient documentation

## 2019-01-02 DIAGNOSIS — E7800 Pure hypercholesterolemia, unspecified: Secondary | ICD-10-CM | POA: Insufficient documentation

## 2019-01-02 DIAGNOSIS — Z79899 Other long term (current) drug therapy: Secondary | ICD-10-CM | POA: Insufficient documentation

## 2019-01-02 DIAGNOSIS — I5042 Chronic combined systolic (congestive) and diastolic (congestive) heart failure: Secondary | ICD-10-CM

## 2019-01-02 DIAGNOSIS — Z902 Acquired absence of lung [part of]: Secondary | ICD-10-CM | POA: Insufficient documentation

## 2019-01-02 DIAGNOSIS — Z9981 Dependence on supplemental oxygen: Secondary | ICD-10-CM | POA: Diagnosis not present

## 2019-01-02 DIAGNOSIS — Z87442 Personal history of urinary calculi: Secondary | ICD-10-CM | POA: Insufficient documentation

## 2019-01-02 DIAGNOSIS — R29818 Other symptoms and signs involving the nervous system: Secondary | ICD-10-CM

## 2019-01-02 DIAGNOSIS — R0602 Shortness of breath: Secondary | ICD-10-CM

## 2019-01-02 DIAGNOSIS — Z87891 Personal history of nicotine dependence: Secondary | ICD-10-CM | POA: Diagnosis not present

## 2019-01-02 DIAGNOSIS — Z7902 Long term (current) use of antithrombotics/antiplatelets: Secondary | ICD-10-CM | POA: Diagnosis not present

## 2019-01-02 DIAGNOSIS — K219 Gastro-esophageal reflux disease without esophagitis: Secondary | ICD-10-CM | POA: Diagnosis not present

## 2019-01-02 DIAGNOSIS — J9611 Chronic respiratory failure with hypoxia: Secondary | ICD-10-CM

## 2019-01-02 DIAGNOSIS — I110 Hypertensive heart disease with heart failure: Secondary | ICD-10-CM | POA: Diagnosis not present

## 2019-01-02 DIAGNOSIS — R5383 Other fatigue: Secondary | ICD-10-CM | POA: Insufficient documentation

## 2019-01-02 DIAGNOSIS — Z7984 Long term (current) use of oral hypoglycemic drugs: Secondary | ICD-10-CM | POA: Diagnosis not present

## 2019-01-02 DIAGNOSIS — J449 Chronic obstructive pulmonary disease, unspecified: Secondary | ICD-10-CM | POA: Insufficient documentation

## 2019-01-02 DIAGNOSIS — Z7982 Long term (current) use of aspirin: Secondary | ICD-10-CM | POA: Insufficient documentation

## 2019-01-02 DIAGNOSIS — Z85118 Personal history of other malignant neoplasm of bronchus and lung: Secondary | ICD-10-CM | POA: Diagnosis not present

## 2019-01-02 DIAGNOSIS — F419 Anxiety disorder, unspecified: Secondary | ICD-10-CM | POA: Insufficient documentation

## 2019-01-02 DIAGNOSIS — I5032 Chronic diastolic (congestive) heart failure: Secondary | ICD-10-CM | POA: Diagnosis not present

## 2019-01-02 DIAGNOSIS — I272 Pulmonary hypertension, unspecified: Secondary | ICD-10-CM

## 2019-01-02 LAB — BASIC METABOLIC PANEL
Anion gap: 13 (ref 5–15)
BUN: 26 mg/dL — ABNORMAL HIGH (ref 8–23)
CO2: 29 mmol/L (ref 22–32)
Calcium: 9.4 mg/dL (ref 8.9–10.3)
Chloride: 92 mmol/L — ABNORMAL LOW (ref 98–111)
Creatinine, Ser: 0.81 mg/dL (ref 0.44–1.00)
GFR calc Af Amer: >60 mL/min (ref >60)
GFR calc non Af Amer: >60 mL/min (ref >60)
Glucose, Bld: 183 mg/dL — ABNORMAL HIGH (ref 70–99)
Potassium: 4.7 mmol/L (ref 3.5–5.1)
Sodium: 134 mmol/L — ABNORMAL LOW (ref 135–145)

## 2019-01-02 LAB — BRAIN NATRIURETIC PEPTIDE: B Natriuretic Peptide: 83.4 pg/mL (ref 0.0–100.0)

## 2019-01-02 MED ORDER — TORSEMIDE 20 MG PO TABS: 40.0000 mg | ORAL_TABLET | Freq: Every day | ORAL | 3 refills | Status: DC

## 2019-01-02 NOTE — Patient Instructions (Signed)
STOP Furosemide START Torsemide 40 mg (2 tabs) daily  Labs today We will only contact you if something comes back abnormal or we need to make some changes. Otherwise no news is good news!  Labs needed in one week  Your physician recommends that you schedule a follow-up appointment in: 4 weeks with Dr Haroldine Laws  Do the following things EVERYDAY: 1) Weigh yourself in the morning before breakfast. Write it down and keep it in a log. 2) Take your medicines as prescribed 3) Eat low salt foods-Limit salt (sodium) to 2000 mg per day.  4) Stay as active as you can everyday 5) Limit all fluids for the day to less than 2 liters   At the [facility], you and your health needs are our priority. As part of our continuing mission to provide you with exceptional heart care, we have created designated Provider Care Teams. These Care Teams include your primary Cardiologist (physician) and Advanced Practice Providers (APPs- Physician Assistants and Nurse Practitioners) who all work together to provide you with the care you need, when you need it.   You may see any of the following providers on your designated Care Team at your next follow up: [name] Dr Glori Bickers . Dr Loralie Champagne . Darrick Grinder, NP   Please be sure to bring in all your medications bottles to every appointment.

## 2019-01-02 NOTE — Progress Notes (Signed)
ADVANCED HF CLINIC CONSULT NOTE  Referring Physician: Dr Gwenlyn Found  Primary Care: Primary Cardiologist: Dr Gwenlyn Found Pulmonary: Dr Melvyn Novas   HPI: Today she presents as referral from Dr Gwenlyn Found for HF consultation.   Caitlin Oliver is a 78 year old with history of COPD, DM, PAD, LCEA 2015, R CEA 2014, GERD, hyperlipidemia, anxiety, htn,chronic respiratory failure on 2 liters at home, lung cancer, S/P L upper lobectomy 2017, and chronic diastolic heart failure.   Admitted 06/16/18 with difficulty walking with ataxia. Code Stroke called. R precentral gyrus 5 mm ischemic stroke noted. Discharged on oxygen. Discharged on asa and plavix. She was later discharged to SNF for an extended stay.   Admitted to [facility] on 11/13/18  with increased shortness of breath and chest pain. Diuresed with IV lasix and transitioned to lasix 40 mg twice a day. Had RHC/LHC with mod pulmonary htn and elevated filling pressures.  Over the past few months she has had multiple episodes of quickly falling asleep during the day despite having a full night of sleep.      Today she presents as new patient with her niece.  Says she has not felt well since her stroke. Overall feeling fair.  Ongoing SOB with exertion. She remains on 2 liters.  Able to walk in the house. She needs assistance getting to appointments. Denies PND/Orthopnea. Often has abdominal bloating. Ambulates in her house with a rolling walker.  Appetite ok. No fever or chills. Weight at home 169-174 pounds. Taking all medications. Lives alone.   RHC/LHC  11/20/2018  RA 11 PAP 79/39 (52)  TPG 21  PCWP 31 PVR  CO 4.3  CI 2.9  1. Mild to moderate, non-obstructive coronary artery disease. 2. Severe pulmonary hypertension. 3. Moderately to severely elevated left and right heart filling pressures. 4. Mildly reduced Fick cardiac output/index. 5. Severe right brachiocephalic and subclavian artery stenoses with combined gradient of ~50 mmHg and possible reversal of flow in the  right vertebral artery (subclavian steal).  ECHO 11/14/18 EF 65%  Moderate Asymmetric hypertrophy septal wall. RV mild reduced. Small pericardial effusion.   Review of Systems: [y] = yes, _0  = no   General: Weight gain _1 ; Weight loss _2 ; Anorexia _3 ; Fatigue [ Y]; Fever _4 ; Chills _5 ; Weakness _6   Cardiac: Chest pain/pressure _7 ; Resting SOB _8 ; Exertional SOB [ Y]; Orthopnea _9 ; Pedal Edema _10 ; Palpitations _11 ; Syncope _12 ; Presyncope _13 ; Paroxysmal nocturnal dyspnea_14   Pulmonary: Cough _15 ; Wheezing_16 ; Hemoptysis_17 ; Sputum _18 ; Snoring _19   GI: Vomiting_20 ; Dysphagia_21 ; Melena_22 ; Hematochezia _23 ; Heartburn_24 ; Abdominal pain _25 ; Constipation _26 ; Diarrhea _27 ; BRBPR _28   GU: Hematuria_29 ; Dysuria _30 ; Nocturia_31   Vascular: Pain in legs with walking _32 ; Pain in feet with lying flat _33 ; Non-healing sores _34 ; Stroke _35 ; TIA _36 ; Slurred speech _37 ;  Neuro: Headaches_38 ; Vertigo_39 ; Seizures_40 ; Paresthesias_41 ;Blurred vision _42 ; Diplopia _43 ; Vision changes _44   Ortho/Skin: Arthritis _45 ; Joint pain [Y ]; Muscle pain _46 ; Joint swelling _47 ; Back Pain [Y ]; Rash _48   Psych: Depression_49 ; Anxiety_50   Heme: Bleeding problems _51 ; Clotting disorders _52 ; Anemia _53   Endocrine: Diabetes [Y ]; Thyroid dysfunction_54    Past Medical History:  Diagnosis Date  . Anxiety   . Carotid  artery disease ([place])   . Claudication ([place])   . COPD (chronic obstructive pulmonary disease) ([place])   . Diabetes mellitus   . Dizzy   . GERD (gastroesophageal reflux disease)   . History of kidney stones   . Hypercholesteremia   . Hypertension   . 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 Stomach ulcer   . Tobacco abuse     Current Outpatient Medications  Medication Sig Dispense Refill  . albuterol (PROVENTIL HFA;VENTOLIN HFA) 108 (90 Base) MCG/ACT inhaler Inhale 2 puffs into the lungs every 6 (six) hours as needed for wheezing or shortness  of breath.     [name] aspirin 81 MG chewable tablet Chew by mouth daily.    [name] BIOTIN 5000 PO Take 5,000 mg by mouth daily before supper.     . budesonide (PULMICORT) 0.25 MG/2ML nebulizer solution Take 2 mLs (0.25 mg total) by nebulization 2 (two) times daily. 60 mL 12  . calcium carbonate (CALCIUM 600) 600 MG TABS tablet Take 600 mg by mouth daily before supper.     . clopidogrel (PLAVIX) 75 MG tablet Take 1 tablet (75 mg total) by mouth daily.    . diazepam (VALIUM) 2 MG tablet Take 5 mg by mouth every 8 (eight) hours as needed for anxiety.     . furosemide (LASIX) 40 MG tablet Take 1 tablet (40 mg total) by mouth 2 (two) times daily. 60 tablet 0  . gabapentin (NEURONTIN) 100 MG capsule Take 1 capsule (100 mg total) by mouth 3 (three) times daily. 90 capsule 0  . HYDROcodone-acetaminophen (NORCO/VICODIN) 5-325 MG tablet Take 1 tablet by mouth every 12 (twelve) hours as needed for moderate pain. 10 tablet 0  . lisinopril (ZESTRIL) 2.5 MG tablet Take 1 tablet (2.5 mg total) by mouth daily. 30 tablet 0  . MAGNESIUM PO Take 1 tablet by mouth daily before supper.    . meclizine (ANTIVERT) 12.5 MG tablet Take 12.5 mg by mouth 3 (three) times daily as needed for dizziness or nausea.     . Menthol, Topical Analgesic, (ICY HOT EX) Apply 1 spray topically every 8 (eight) hours as needed (pain).    . metFORMIN (GLUCOPHAGE) 500 MG tablet Take by mouth 2 (two) times daily with a meal.    . metoprolol tartrate (LOPRESSOR) 25 MG tablet Take 1 tablet (25 mg total) by mouth 2 (two) times daily with a meal. 60 tablet 0  . Omega-3 Fatty Acids (FISH OIL PO) Take 1 capsule by mouth daily.    . ondansetron (ZOFRAN) 4 MG tablet Take 4 mg by mouth at bedtime as needed for nausea or vomiting.     . ONE TOUCH ULTRA TEST test strip     . pantoprazole (PROTONIX) 40 MG tablet Take 40 mg by mouth 2 (two) times daily.     . pravastatin (PRAVACHOL) 40 MG tablet Take 40 mg by mouth daily before supper.     . traZODone (DESYREL) 50  MG tablet Take 50 mg by mouth at bedtime.    . Fluticasone-Umeclidin-Vilant (TRELEGY ELLIPTA) 100-62.5-25 MCG/INH AEPB Inhale 1 puff into the lungs daily. (Patient not taking: Reported on 01/02/2019) 1 each 0   No current facility-administered medications for this encounter.     Allergies  Allergen Reactions  . No Known Allergies Other (See Comments)      Social History   Socioeconomic History  . Marital status: Widowed    Spouse name: Not on  file  . Number of children: Not on file  . Years of education: Not on file  . Highest education level: Not on file  Occupational History  . Not on file  Social Needs  . Financial resource strain: Not on file  . Food insecurity    Worry: Not on file    Inability: Not on file  . Transportation needs    Medical: Not on file    Non-medical: Not on file  Tobacco Use  . Smoking status: Former Smoker    Packs/day: 1.50    Years: 56.00    Pack years: 84.00    Types: Cigarettes    Quit date: 07/07/2013    Years since quitting: 5.4  . Smokeless tobacco: Former Systems developer    Quit date: 02/25/2013  Substance and Sexual Activity  . Alcohol use: No  . Drug use: No  . Sexual activity: Never  Lifestyle  . Physical activity    Days per week: Not on file    Minutes per session: Not on file  . Stress: Not on file  Relationships  . Social Herbalist on phone: Not on file    Gets together: Not on file    Attends religious service: Not on file    Active member of club or organization: Not on file    Attends meetings of clubs or organizations: Not on file    Relationship status: Not on file  . Intimate partner violence    Fear of current or ex partner: Not on file    Emotionally abused: Not on file    Physically abused: Not on file    Forced sexual activity: Not on file  Other Topics Concern  . Not on file  Social History Narrative  . Not on file      Family History  Problem Relation Age of Onset  . Stroke Mother   .  Hypertension Mother   . Kidney failure Father     Vitals:   01/02/19 1007  BP: 110/60  Pulse: 85  SpO2: 94%  Weight: 79.1 kg (174 lb 6.4 oz)   Wt Readings from Last 3 Encounters:  01/02/19 79.1 kg (174 lb 6.4 oz)  11/24/18 78.8 kg (173 lb 12.8 oz)  10/03/18 82.8 kg (182 lb 9.6 oz)    PHYSICAL EXAM: General:  Appears chronically ill. No respiratory difficulty. HEENT: normal Neck: supple. JVP ~10. Carotids 2+ bilat; no bruits. No lymphadenopathy or thryomegaly appreciated. Cor: PMI nondisplaced. Regular rate & rhythm. No rubs, gallops or murmurs. Lungs: Decreased in the bases. On 2 liters oxygen.  Abdomen: soft, nontender, distended. No hepatosplenomegaly. No bruits or masses. Good bowel sounds. Extremities: no cyanosis, clubbing, rash, Rand LLE trace-1+  edema Neuro: alert & oriented x 3, cranial nerves grossly intact. moves all 4 extremities w/o difficulty. Affect pleasant.  ASSESSMENT & PLAN: 1. Pulmonary HTN, Mixed presentation.  RHC RA 11,PAP 79/39 (52) ,PCWP 31, PVR 4.8, CO 4.3 , CI 2.9 Elevated filling pressures noted on RHC. Does not appear to be primary PAH but mixed presentation.  She does need to continue chronic oxygen. Would benefit from sleep study but she refuses. - Volume overloaded today. Stop lasix and start torsemide to see this helps.   - Repeat ECHO in 3 months.   2. Diastolic HF ECHO 10/08/61 EF 65% small pericardial effusion.  NYHA III. Volume status elevated. Stop lasix. Tomorrow she will start torsemide 40 mg daily.  -Check BMET next week.  -  Will need to check amyloidosis--> Check myeloma panel. May need PYP scan  3. Chronic Respiratory Failure On home oxygen.   4. Suspected Sleep Apnea  Day time fatigue. Offered to set of sleep study however she declines.   5. H/O Lung Cancer S/P LUL lobectomy   6. Former smoker   7. PAD Followed by Dr Karlyne Greenspan. On statin  8. H/O CVA 06/2018  On statin, asa  Greater than 50% of the total minutes 45  visit  spent in counseling/coordination of care regarding the above. Discussed mediations changes and lab test for today.   Follow up in 4 weeks with Dr Haroldine Laws.   Wylder Macomber NP-C  4:45 PM

## 2019-01-05 LAB — MULTIPLE MYELOMA PANEL, SERUM
Albumin SerPl Elph-Mcnc: 4 g/dL (ref 2.9–4.4)
Albumin/Glob SerPl: 1.5 (ref 0.7–1.7)
Alpha 1: 0.2 g/dL (ref 0.0–0.4)
Alpha2 Glob SerPl Elph-Mcnc: 0.9 g/dL (ref 0.4–1.0)
B-Globulin SerPl Elph-Mcnc: 1 g/dL (ref 0.7–1.3)
Gamma Glob SerPl Elph-Mcnc: 0.6 g/dL (ref 0.4–1.8)
Globulin, Total: 2.7 g/dL (ref 2.2–3.9)
IgA: 124 mg/dL (ref 64–422)
IgG (Immunoglobin G), Serum: 772 mg/dL (ref 586–1602)
IgM (Immunoglobulin M), Srm: 38 mg/dL (ref 26–217)
Total Protein ELP: 6.7 g/dL (ref 6.0–8.5)

## 2019-01-09 ENCOUNTER — Telehealth (HOSPITAL_COMMUNITY): Payer: Self-pay | Admitting: Cardiology

## 2019-01-09 ENCOUNTER — Other Ambulatory Visit: Payer: Self-pay

## 2019-01-09 ENCOUNTER — Ambulatory Visit (HOSPITAL_COMMUNITY)
Admission: RE | Admit: 2019-01-09 | Discharge: 2019-01-09 | Disposition: A | Discharge status: Home / Self Care | Payer: PPO | Source: Ambulatory Visit | Attending: Cardiology | Admitting: Cardiology

## 2019-01-09 DIAGNOSIS — I5042 Chronic combined systolic (congestive) and diastolic (congestive) heart failure: Secondary | ICD-10-CM | POA: Diagnosis not present

## 2019-01-09 LAB — BASIC METABOLIC PANEL
Anion gap: 9 (ref 5–15)
BUN: 21 mg/dL (ref 8–23)
CO2: 34 mmol/L — ABNORMAL HIGH (ref 22–32)
Calcium: 9.6 mg/dL (ref 8.9–10.3)
Chloride: 94 mmol/L — ABNORMAL LOW (ref 98–111)
Creatinine, Ser: 0.78 mg/dL (ref 0.44–1.00)
GFR calc Af Amer: >60 mL/min (ref >60)
GFR calc non Af Amer: >60 mL/min (ref >60)
Glucose, Bld: 182 mg/dL — ABNORMAL HIGH (ref 70–99)
Potassium: 4.9 mmol/L (ref 3.5–5.1)
Sodium: 137 mmol/L (ref 135–145)

## 2019-01-09 NOTE — Telephone Encounter (Signed)
During patient labs appointment patient c/o increased abd bloating and swelling, she does not weigh daily so no weights are available. +BLE, rings are tight on both hands, decreased UOP since switching from Lasix to Demadex last week.  Denies increased SOB Reports medication compliance, has not tried sliding scale diuretics.   Advised will forward message to provider and will likely need lab results before mediation adjustments are made.

## 2019-01-09 NOTE — Telephone Encounter (Signed)
Patients niece advised and verbalized understanding. Lab appt scheduled. Advised her that we will call her back once we figure out his schedule for next week

## 2019-01-09 NOTE — Telephone Encounter (Signed)
Increase torsemide to 40 mg bid and get BMET Tuesday.  Work her in with me one day next week if possible.

## 2019-01-13 ENCOUNTER — Ambulatory Visit (HOSPITAL_COMMUNITY)
Admission: RE | Admit: 2019-01-13 | Discharge: 2019-01-13 | Disposition: A | Discharge status: Home / Self Care | Payer: PPO | Source: Ambulatory Visit | Attending: Cardiology | Admitting: Cardiology

## 2019-01-13 ENCOUNTER — Other Ambulatory Visit: Payer: Self-pay

## 2019-01-13 ENCOUNTER — Other Ambulatory Visit (HOSPITAL_COMMUNITY): Payer: Self-pay

## 2019-01-13 DIAGNOSIS — I10 Essential (primary) hypertension: Secondary | ICD-10-CM

## 2019-01-13 LAB — BASIC METABOLIC PANEL
Anion gap: 13 (ref 5–15)
BUN: 28 mg/dL — ABNORMAL HIGH (ref 8–23)
CO2: 30 mmol/L (ref 22–32)
Calcium: 9.3 mg/dL (ref 8.9–10.3)
Chloride: 92 mmol/L — ABNORMAL LOW (ref 98–111)
Creatinine, Ser: 1.09 mg/dL — ABNORMAL HIGH (ref 0.44–1.00)
GFR calc Af Amer: 56 mL/min — ABNORMAL LOW (ref >60)
GFR calc non Af Amer: 49 mL/min — ABNORMAL LOW (ref >60)
Glucose, Bld: 214 mg/dL — ABNORMAL HIGH (ref 70–99)
Potassium: 4.4 mmol/L (ref 3.5–5.1)
Sodium: 135 mmol/L (ref 135–145)

## 2019-01-19 ENCOUNTER — Telehealth (HOSPITAL_COMMUNITY): Payer: Self-pay | Admitting: *Deleted

## 2019-01-19 DIAGNOSIS — R609 Edema, unspecified: Secondary | ICD-10-CM | POA: Diagnosis not present

## 2019-01-19 DIAGNOSIS — R300 Dysuria: Secondary | ICD-10-CM | POA: Diagnosis not present

## 2019-01-19 DIAGNOSIS — N898 Other specified noninflammatory disorders of vagina: Secondary | ICD-10-CM | POA: Diagnosis not present

## 2019-01-19 MED ORDER — TORSEMIDE 20 MG PO TABS: ORAL_TABLET | ORAL | 3 refills | Status: DC

## 2019-01-19 NOTE — Telephone Encounter (Signed)
Pt is prescribed torsemide 94m bid but her bp started running low and her niece stopped her evening dose on Friday. Today she is with her pcp and her weight is up and bp stable at 114/58. Per Dr.McLean take Torsemide 454min the AM and 2082mn the PM. LinRia Comment PCP office aware and will communicate medication change with patient and niece.

## 2019-01-20 NOTE — Telephone Encounter (Signed)
Pt has pending appt w/db 10/23

## 2019-01-27 ENCOUNTER — Other Ambulatory Visit: Payer: Self-pay

## 2019-01-27 ENCOUNTER — Encounter (HOSPITAL_COMMUNITY): Payer: Self-pay | Admitting: Emergency Medicine

## 2019-01-27 ENCOUNTER — Emergency Department (HOSPITAL_COMMUNITY)
Admission: EM | Admit: 2019-01-27 | Discharge: 2019-01-27 | Discharge status: Against Medical Advice | Payer: PPO | Attending: Emergency Medicine | Admitting: Emergency Medicine

## 2019-01-27 DIAGNOSIS — Z5321 Procedure and treatment not carried out due to patient leaving prior to being seen by health care provider: Secondary | ICD-10-CM | POA: Diagnosis not present

## 2019-01-27 DIAGNOSIS — R42 Dizziness and giddiness: Secondary | ICD-10-CM | POA: Diagnosis present

## 2019-01-27 LAB — URINALYSIS, ROUTINE W REFLEX MICROSCOPIC
Bilirubin Urine: NEGATIVE
Glucose, UA: NEGATIVE mg/dL
Hgb urine dipstick: NEGATIVE
Ketones, ur: NEGATIVE mg/dL
Nitrite: NEGATIVE
Protein, ur: NEGATIVE mg/dL
Specific Gravity, Urine: 1.024 (ref 1.005–1.030)
pH: 5 (ref 5.0–8.0)

## 2019-01-27 LAB — CBC
HCT: 37 % (ref 36.0–46.0)
Hemoglobin: 11.6 g/dL — ABNORMAL LOW (ref 12.0–15.0)
MCH: 26.7 pg (ref 26.0–34.0)
MCHC: 31.4 g/dL (ref 30.0–36.0)
MCV: 85.1 fL (ref 80.0–100.0)
Platelets: 304 10*3/uL (ref 150–400)
RBC: 4.35 MIL/uL (ref 3.87–5.11)
RDW: 14.6 % (ref 11.5–15.5)
WBC: 8.9 10*3/uL (ref 4.0–10.5)
nRBC: 0 % (ref 0.0–0.2)

## 2019-01-27 LAB — COMPREHENSIVE METABOLIC PANEL
ALT: 16 U/L (ref 0–44)
AST: 23 U/L (ref 15–41)
Albumin: 4 g/dL (ref 3.5–5.0)
Alkaline Phosphatase: 59 U/L (ref 38–126)
Anion gap: 17 — ABNORMAL HIGH (ref 5–15)
BUN: 28 mg/dL — ABNORMAL HIGH (ref 8–23)
CO2: 24 mmol/L (ref 22–32)
Calcium: 9.4 mg/dL (ref 8.9–10.3)
Chloride: 90 mmol/L — ABNORMAL LOW (ref 98–111)
Creatinine, Ser: 1.2 mg/dL — ABNORMAL HIGH (ref 0.44–1.00)
GFR calc Af Amer: 50 mL/min — ABNORMAL LOW (ref >60)
GFR calc non Af Amer: 43 mL/min — ABNORMAL LOW (ref >60)
Glucose, Bld: 193 mg/dL — ABNORMAL HIGH (ref 70–99)
Potassium: 3.6 mmol/L (ref 3.5–5.1)
Sodium: 131 mmol/L — ABNORMAL LOW (ref 135–145)
Total Bilirubin: 0.7 mg/dL (ref 0.3–1.2)
Total Protein: 7 g/dL (ref 6.5–8.1)

## 2019-01-27 LAB — LIPASE, BLOOD: Lipase: 40 U/L (ref 11–51)

## 2019-01-27 MED ORDER — SODIUM CHLORIDE 0.9% FLUSH: 3.0000 mL | Freq: Once | INTRAVENOUS | Status: DC

## 2019-01-27 NOTE — ED Triage Notes (Signed)
Pt in with c/o dizziness, ongoing for weeks, HA and difficulty swallowing. States nausea and abdominal pain also kept her up all night last night.

## 2019-01-27 NOTE — ED Notes (Signed)
Pt family made decision to leave ED before pt was able to be seen. Pt encouraged to remain to be seen, however family made decision to leave ED.

## 2019-01-28 DIAGNOSIS — J449 Chronic obstructive pulmonary disease, unspecified: Secondary | ICD-10-CM | POA: Diagnosis not present

## 2019-01-28 DIAGNOSIS — I272 Pulmonary hypertension, unspecified: Secondary | ICD-10-CM | POA: Diagnosis not present

## 2019-01-28 DIAGNOSIS — I69398 Other sequelae of cerebral infarction: Secondary | ICD-10-CM | POA: Diagnosis not present

## 2019-01-28 DIAGNOSIS — Z85118 Personal history of other malignant neoplasm of bronchus and lung: Secondary | ICD-10-CM | POA: Diagnosis not present

## 2019-01-30 DIAGNOSIS — R1310 Dysphagia, unspecified: Secondary | ICD-10-CM | POA: Diagnosis not present

## 2019-01-30 DIAGNOSIS — N289 Disorder of kidney and ureter, unspecified: Secondary | ICD-10-CM | POA: Diagnosis not present

## 2019-01-30 DIAGNOSIS — R600 Localized edema: Secondary | ICD-10-CM | POA: Diagnosis not present

## 2019-01-30 DIAGNOSIS — Z23 Encounter for immunization: Secondary | ICD-10-CM | POA: Diagnosis not present

## 2019-01-30 DIAGNOSIS — K0499 Other diseases of pulp and periapical tissues: Secondary | ICD-10-CM | POA: Diagnosis not present

## 2019-01-30 DIAGNOSIS — R42 Dizziness and giddiness: Secondary | ICD-10-CM | POA: Diagnosis not present

## 2019-02-02 ENCOUNTER — Telehealth: Payer: Self-pay | Admitting: Neurology

## 2019-02-02 DIAGNOSIS — I6782 Cerebral ischemia: Secondary | ICD-10-CM | POA: Diagnosis not present

## 2019-02-02 DIAGNOSIS — R519 Headache, unspecified: Secondary | ICD-10-CM | POA: Diagnosis not present

## 2019-02-02 DIAGNOSIS — R42 Dizziness and giddiness: Secondary | ICD-10-CM | POA: Diagnosis not present

## 2019-02-02 DIAGNOSIS — G9389 Other specified disorders of brain: Secondary | ICD-10-CM | POA: Diagnosis not present

## 2019-02-02 NOTE — Telephone Encounter (Signed)
Caitlin Oliver_0  Medicine in [place] left a message on office voicemail, she left pt and and date of birth asking for a call back.  Phone rep called back and left voicemail asking the office to call back to [place]

## 2019-02-05 DIAGNOSIS — G43909 Migraine, unspecified, not intractable, without status migrainosus: Secondary | ICD-10-CM | POA: Diagnosis not present

## 2019-02-05 DIAGNOSIS — I272 Pulmonary hypertension, unspecified: Secondary | ICD-10-CM | POA: Diagnosis not present

## 2019-02-05 DIAGNOSIS — E7800 Pure hypercholesterolemia, unspecified: Secondary | ICD-10-CM | POA: Diagnosis not present

## 2019-02-05 DIAGNOSIS — R42 Dizziness and giddiness: Secondary | ICD-10-CM | POA: Diagnosis not present

## 2019-02-05 DIAGNOSIS — I2510 Atherosclerotic heart disease of native coronary artery without angina pectoris: Secondary | ICD-10-CM | POA: Diagnosis not present

## 2019-02-05 DIAGNOSIS — I5033 Acute on chronic diastolic (congestive) heart failure: Secondary | ICD-10-CM | POA: Diagnosis not present

## 2019-02-05 DIAGNOSIS — K219 Gastro-esophageal reflux disease without esophagitis: Secondary | ICD-10-CM | POA: Diagnosis not present

## 2019-02-05 DIAGNOSIS — C3492 Malignant neoplasm of unspecified part of left bronchus or lung: Secondary | ICD-10-CM | POA: Diagnosis not present

## 2019-02-05 DIAGNOSIS — E1151 Type 2 diabetes mellitus with diabetic peripheral angiopathy without gangrene: Secondary | ICD-10-CM | POA: Diagnosis not present

## 2019-02-05 DIAGNOSIS — I110 Hypertensive heart disease with heart failure: Secondary | ICD-10-CM | POA: Diagnosis not present

## 2019-02-05 DIAGNOSIS — G8220 Paraplegia, unspecified: Secondary | ICD-10-CM | POA: Diagnosis not present

## 2019-02-05 DIAGNOSIS — J449 Chronic obstructive pulmonary disease, unspecified: Secondary | ICD-10-CM | POA: Diagnosis not present

## 2019-02-05 DIAGNOSIS — F329 Major depressive disorder, single episode, unspecified: Secondary | ICD-10-CM | POA: Diagnosis not present

## 2019-02-05 DIAGNOSIS — K1121 Acute sialoadenitis: Secondary | ICD-10-CM | POA: Diagnosis not present

## 2019-02-06 ENCOUNTER — Ambulatory Visit (HOSPITAL_COMMUNITY)
Admission: RE | Admit: 2019-02-06 | Discharge: 2019-02-06 | Disposition: A | Discharge status: Home / Self Care | Payer: PPO | Source: Ambulatory Visit | Attending: Internal Medicine | Admitting: Internal Medicine

## 2019-02-06 ENCOUNTER — Other Ambulatory Visit: Payer: Self-pay

## 2019-02-06 ENCOUNTER — Encounter (HOSPITAL_COMMUNITY): Payer: Self-pay | Admitting: Internal Medicine

## 2019-02-06 VITALS — BP 100/60 | HR 83 | Wt 172.8 lb

## 2019-02-06 DIAGNOSIS — I313 Pericardial effusion (noninflammatory): Secondary | ICD-10-CM | POA: Diagnosis not present

## 2019-02-06 DIAGNOSIS — Z7984 Long term (current) use of oral hypoglycemic drugs: Secondary | ICD-10-CM | POA: Diagnosis not present

## 2019-02-06 DIAGNOSIS — I5032 Chronic diastolic (congestive) heart failure: Secondary | ICD-10-CM | POA: Insufficient documentation

## 2019-02-06 DIAGNOSIS — R5383 Other fatigue: Secondary | ICD-10-CM | POA: Diagnosis not present

## 2019-02-06 DIAGNOSIS — E7800 Pure hypercholesterolemia, unspecified: Secondary | ICD-10-CM | POA: Insufficient documentation

## 2019-02-06 DIAGNOSIS — Z8249 Family history of ischemic heart disease and other diseases of the circulatory system: Secondary | ICD-10-CM | POA: Insufficient documentation

## 2019-02-06 DIAGNOSIS — J9611 Chronic respiratory failure with hypoxia: Secondary | ICD-10-CM

## 2019-02-06 DIAGNOSIS — Z8673 Personal history of transient ischemic attack (TIA), and cerebral infarction without residual deficits: Secondary | ICD-10-CM | POA: Insufficient documentation

## 2019-02-06 DIAGNOSIS — R42 Dizziness and giddiness: Secondary | ICD-10-CM | POA: Diagnosis not present

## 2019-02-06 DIAGNOSIS — I110 Hypertensive heart disease with heart failure: Secondary | ICD-10-CM | POA: Diagnosis not present

## 2019-02-06 DIAGNOSIS — I2510 Atherosclerotic heart disease of native coronary artery without angina pectoris: Secondary | ICD-10-CM | POA: Diagnosis not present

## 2019-02-06 DIAGNOSIS — Z7902 Long term (current) use of antithrombotics/antiplatelets: Secondary | ICD-10-CM | POA: Insufficient documentation

## 2019-02-06 DIAGNOSIS — K219 Gastro-esophageal reflux disease without esophagitis: Secondary | ICD-10-CM | POA: Insufficient documentation

## 2019-02-06 DIAGNOSIS — E785 Hyperlipidemia, unspecified: Secondary | ICD-10-CM | POA: Insufficient documentation

## 2019-02-06 DIAGNOSIS — Z7982 Long term (current) use of aspirin: Secondary | ICD-10-CM | POA: Insufficient documentation

## 2019-02-06 DIAGNOSIS — I272 Pulmonary hypertension, unspecified: Secondary | ICD-10-CM | POA: Diagnosis not present

## 2019-02-06 DIAGNOSIS — Z79899 Other long term (current) drug therapy: Secondary | ICD-10-CM | POA: Diagnosis not present

## 2019-02-06 DIAGNOSIS — Z9981 Dependence on supplemental oxygen: Secondary | ICD-10-CM | POA: Diagnosis not present

## 2019-02-06 DIAGNOSIS — I2721 Secondary pulmonary arterial hypertension: Secondary | ICD-10-CM | POA: Diagnosis not present

## 2019-02-06 DIAGNOSIS — E1151 Type 2 diabetes mellitus with diabetic peripheral angiopathy without gangrene: Secondary | ICD-10-CM | POA: Diagnosis not present

## 2019-02-06 DIAGNOSIS — R110 Nausea: Secondary | ICD-10-CM | POA: Insufficient documentation

## 2019-02-06 DIAGNOSIS — I5042 Chronic combined systolic (congestive) and diastolic (congestive) heart failure: Secondary | ICD-10-CM | POA: Diagnosis not present

## 2019-02-06 DIAGNOSIS — Z87891 Personal history of nicotine dependence: Secondary | ICD-10-CM | POA: Diagnosis not present

## 2019-02-06 DIAGNOSIS — J449 Chronic obstructive pulmonary disease, unspecified: Secondary | ICD-10-CM | POA: Diagnosis not present

## 2019-02-06 DIAGNOSIS — I739 Peripheral vascular disease, unspecified: Secondary | ICD-10-CM | POA: Insufficient documentation

## 2019-02-06 NOTE — Progress Notes (Addendum)
ADVANCED HF CLINIC NOTE  Referring Physician: Dr Gwenlyn Found  Primary Care: Primary Cardiologist: Dr Gwenlyn Found Pulmonary: Dr Melvyn Novas   HPI  [name] is a 78 year old with history of COPD, DM, PAD, LCEA 2015, R CEA 2014, GERD, hyperlipidemia, anxiety, HTN,chronic respiratory failure on 2 liters at home, lung cancer, S/P L upper lobectomy 2017, and chronic diastolic heart failure.   Admitted 06/16/18 with difficulty walking with ataxia. Code Stroke called. R precentral gyrus 5 mm ischemic stroke noted. Discharged on oxygen. Discharged on asa and plavix. She was later discharged to SNF for an extended stay.   Admitted to [facility] on 11/13/18  with increased shortness of breath and chest pain. Diuresed with IV lasix and transitioned to lasix 40 mg twice a day. Had RHC/LHC with mod pulmonary htn and elevated filling pressures.  Over the past few months she has had multiple episodes of quickly falling asleep during the day despite having a full night of sleep.      She was initially seen in HF Clinic in 9/20 by Darrick Grinder, NP-C. Felt to be fluid overloaded and lasix switched to torsemide. Weight down about 2 pounds. No edema. Wears 1L O2 when sitting. When getting around or sleeping will use 2L.Pulse ox stays in mid 90s. Remains very tired. Doesn't know if she snores a lot. Refused sleep study. Has chronic nausea and dizziness. 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 Says she is dizzy all the time. Quit smoking 6 years ago. Gets SOB with any activity regularly.   PFTs 8/17 FEV1 1.77 (84%) FVC 2.49 (88%) DLCO 69%  RHC/LHC  11/20/2018  RA 11 PAP 79/39 (52)  TPG 21  PCWP 31 PVR 4.9  CO 4.3  CI 2.9  1. Mild to moderate, non-obstructive coronary artery disease. 2. Severe pulmonary hypertension. 3. Moderately to severely elevated left and right heart filling pressures. 4. Mildly reduced Fick cardiac output/index. 5. Severe right brachiocephalic and subclavian artery stenoses with  combined gradient of ~50 mmHg and possible reversal of flow in the right vertebral artery (subclavian steal).  ECHO 11/14/18 EF 65%  Moderate Asymmetric hypertrophy septal wall. RV mild reduced. Small pericardial effusion.    Past Medical History:  Diagnosis Date  . Anxiety   . Carotid artery disease ([place])   . Claudication ([place])   . COPD (chronic obstructive pulmonary disease) ([place])   . Diabetes mellitus   . Dizzy   . GERD (gastroesophageal reflux disease)   . History of kidney stones   . Hypercholesteremia   . Hypertension   . 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 Stomach ulcer   . Tobacco abuse     Current Outpatient Medications  Medication Sig Dispense Refill  . albuterol (PROVENTIL HFA;VENTOLIN HFA) 108 (90 Base) MCG/ACT inhaler Inhale 2 puffs into the lungs every 6 (six) hours as needed for wheezing or shortness of breath.     [name] aspirin 81 MG chewable tablet Chew by mouth daily.    [name] BIOTIN 5000 PO Take 5,000 mg by mouth daily before supper.     . budesonide (PULMICORT) 0.25 MG/2ML nebulizer solution Take 2 mLs (0.25 mg total) by nebulization 2 (two) times daily. 60 mL 12  . calcium carbonate (CALCIUM 600) 600 MG TABS tablet Take 600 mg by mouth daily before supper.     . clindamycin (CLEOCIN) 300 MG capsule Take by mouth.    . clopidogrel (  PLAVIX) 75 MG tablet Take 1 tablet (75 mg total) by mouth daily.    [name] gabapentin (NEURONTIN) 100 MG capsule Take 1 capsule (100 mg total) by mouth 3 (three) times daily. 90 capsule 0  . HYDROcodone-acetaminophen (NORCO/VICODIN) 5-325 MG tablet Take 1 tablet by mouth every 12 (twelve) hours as needed for moderate pain. 10 tablet 0  . lisinopril (ZESTRIL) 2.5 MG tablet Take 1 tablet (2.5 mg total) by mouth daily. 30 tablet 0  . MAGNESIUM PO Take 1 tablet by mouth daily before supper.    . meclizine (ANTIVERT) 12.5 MG tablet Take 12.5 mg by mouth 3 (three) times daily as needed for dizziness or  nausea.     . Menthol, Topical Analgesic, (ICY HOT EX) Apply 1 spray topically every 8 (eight) hours as needed (pain).    . metFORMIN (GLUCOPHAGE) 500 MG tablet Take by mouth 2 (two) times daily with a meal.    . metoprolol tartrate (LOPRESSOR) 25 MG tablet Take 1 tablet (25 mg total) by mouth 2 (two) times daily with a meal. 60 tablet 0  . Omega-3 Fatty Acids (FISH OIL PO) Take 1 capsule by mouth daily.    . ondansetron (ZOFRAN) 4 MG tablet Take 4 mg by mouth at bedtime as needed for nausea or vomiting.     . ONE TOUCH ULTRA TEST test strip     . pantoprazole (PROTONIX) 40 MG tablet Take 40 mg by mouth 2 (two) times daily.     . polyethylene glycol (MIRALAX / GLYCOLAX) 17 g packet Take by mouth.    . pravastatin (PRAVACHOL) 40 MG tablet Take 40 mg by mouth daily before supper.     . torsemide (DEMADEX) 20 MG tablet Take 33m in the AM and 256min the PM. 180 tablet 3  . traZODone (DESYREL) 50 MG tablet Take 50 mg by mouth at bedtime.     No current facility-administered medications for this encounter.     No Known Allergies    Social History   Socioeconomic History  . Marital status: Widowed    Spouse name: Not on file  . Number of children: Not on file  . Years of education: Not on file  . Highest education level: Not on file  Occupational History  . Not on file  Social Needs  . Financial resource strain: Not on file  . Food insecurity    Worry: Not on file    Inability: Not on file  . Transportation needs    Medical: Not on file    Non-medical: Not on file  Tobacco Use  . Smoking status: Former Smoker    Packs/day: 1.50    Years: 56.00    Pack years: 84.00    Types: Cigarettes    Quit date: 07/07/2013    Years since quitting: 5.5  . Smokeless tobacco: Former [employer] developer  Quit date: 02/25/2013  Substance and Sexual Activity  . Alcohol use: No  . Drug use: No  . Sexual activity: Never  Lifestyle  . Physical activity    Days per week: Not on file    Minutes per  session: Not on file  . Stress: Not on file  Relationships  . Social coHerbalistn phone: Not on file    Gets together: Not on file    Attends religious service: Not on file    Active member of club or organization: Not on file    Attends meetings of clubs or organizations:  Not on file    Relationship status: Not on file  . Intimate partner violence    Fear of current or ex partner: Not on file    Emotionally abused: Not on file    Physically abused: Not on file    Forced sexual activity: Not on file  Other Topics Concern  . Not on file  Social History Narrative  . Not on file      Family History  Problem Relation Age of Onset  . Stroke Mother   . Hypertension Mother   . Kidney failure Father     Vitals:   02/06/19 1210  BP: 100/60  Pulse: 83  SpO2: 100%  Weight: 78.4 kg (172 lb 12.8 oz)   Wt Readings from Last 3 Encounters:  02/06/19 78.4 kg (172 lb 12.8 oz)  01/27/19 79.1 kg (174 lb 6.1 oz)  01/02/19 79.1 kg (174 lb 6.4 oz)    PHYSICAL EXAM: General:  Elderly. Weak appearing. Sitting in WC on O2  No resp difficulty HEENT: normal Neck: supple. JVP 6-7. Carotids 2+ bilat; no bruits. No lymphadenopathy or thryomegaly appreciated. Cor: PMI nondisplaced. Regular rate & rhythm. No rubs, gallops or murmurs. Lungs: clear with mildly decreased BS Abdomen: soft, nontender, nondistended. No hepatosplenomegaly. No bruits or masses. Good bowel sounds. Extremities: no cyanosis, clubbing, rash, edema Neuro: alert & orientedx3, cranial nerves grossly intact. moves all 4 extremities w/o difficulty. Affect pleasant   ASSESSMENT & PLAN:  1. Pulmonary HTN, Mixed presentation.  - she has mixed PAH/PVH with previous  RHC RA 11,PAP 79/39 (52) ,PCWP 31, PVR 4.8, CO 4.3 , CI 2.9 - suspect combination of WHO Group II and III - Need to optimize volume status (improved with torsemide) and make sure she has adequate oxygenation - Will continue monitorng with pulse ox. Check  home sleep study - Repeat PFTs with [facility] - Refuses Pulmonary Rehab due to distance to travel - Not candidate for selective pulmonary artery vasodilators - Consider RHC in 4-6 months  2. Diastolic HF - ECHO 6/80/88 EF 65% small pericardial effusion.  - Stable NYHA III. Volume status improved with torsemide  3. Chronic Hypoxic Respiratory Failure due to COPD and H/O Lung Cancer S/P LUL lobectomy  - On home oxygen.  - F/u with Dr. Melvyn Novas  4. Suspected Sleep Apnea  - schedule for mhme sleep study  5. Dizziness - suspect related to previous CVA   Glori Bickers MD 12:26 PM

## 2019-02-06 NOTE — Patient Instructions (Addendum)
Your provider has recommended that you have a home sleep study.  [employer] is the company that does these test.  They will contact you by phone and must speak with you before they can ship the equipment.  Once they have spoken with you they will send the equipment right to your home with instructions on how to set it up.  Once you have completed the test you just dispose of the equipment, the information is automatically uploaded to [place] via [employer] technology.  IF you have any questions or issues with the equipment please call the company directly at [phone].  If your test is positive for sleep apnea and you need a home CPAP machine you will be contacted by Dr Theodosia Blender office [facility]) to set this up.  Your physician has recommended that you have a pulmonary function test. Pulmonary Function Tests are a group of tests that measure how well air moves in and out of your lungs.  Your physician recommends that you schedule a follow-up appointment in: 4 months with Dr Haroldine Laws  At the [facility], you and your health needs are our priority. As part of our continuing mission to provide you with exceptional heart care, we have created designated Provider Care Teams. These Care Teams include your primary Cardiologist (physician) and Advanced Practice Providers (APPs- Physician Assistants and Nurse Practitioners) who all work together to provide you with the care you need, when you need it.   You may see any of the following providers on your designated Care Team at your next follow up: [name] Dr Glori Bickers . Dr Loralie Champagne . Darrick Grinder, NP . Lyda Jester, PA   Please be sure to bring in all your medications bottles to every appointment.

## 2019-02-06 NOTE — Progress Notes (Signed)
Patient Name: Caitlin Oliver         DOB: [birth year]      Height: 5'7    Weight: 172  Office Name:Advanced Heart Failure         Referring Provider: Glori Bickers  Today's Date: 02/06/19  Date:   STOP BANG RISK ASSESSMENT S (snore) Have you been told that you snore?     NO   T (tired) Are you often tired, fatigued, or sleepy during the day?   YES  O (obstruction) Do you stop breathing, choke, or gasp during sleep? NO   P (pressure) Do you have or are you being treated for high blood pressure? YES   B (BMI) Is your body index greater than 35 kg/m? NO   A (age) Are you 78 years old or older? YES   N (neck) Do you have a neck circumference greater than 16 inches?   NA  G (gender) Are you a female? NO   TOTAL STOP/BANG "YES" ANSWERS 3                                                                       For Office Use Only              Procedure Order Form    YES to 3+ Stop Bang questions OR two clinical symptoms - patient qualifies for WatchPAT (CPT 95800)     Submit: This Form + Patient Face Sheet + Clinical Note via CloudPAT or Fax: [phone]         Clinical Notes: Will consult Sleep Specialist and refer for management of therapy due to patient increased risk of Sleep Apnea. Ordering a sleep study due to the following two clinical symptoms: Excessive daytime sleepiness G47.10/History of high blood pressure    I understand that I am proceeding with a home sleep apnea test as ordered by my treating physician. I understand that untreated sleep apnea is a serious cardiovascular risk factor and it is my responsibility to perform the test and seek management for sleep apnea. I will be contacted with the results and be managed for sleep apnea by a local sleep physician. I will be receiving equipment and further instructions from [facility]. I shall promptly ship back the equipment via the included mailing label. I understand my insurance will be billed for the test and as the  patient I am responsible for any insurance related out-of-pocket costs incurred. I have been provided with written instructions and can call for additional video or telephonic instruction, with 24-hour availability of qualified personnel to answer any questions: Patient Help Desk [phone].  Patient Signature ______________________________________________________   Date______________________ Patient Telemedicine Verbal Consent

## 2019-02-09 DIAGNOSIS — C3492 Malignant neoplasm of unspecified part of left bronchus or lung: Secondary | ICD-10-CM | POA: Diagnosis not present

## 2019-02-09 DIAGNOSIS — R0902 Hypoxemia: Secondary | ICD-10-CM | POA: Diagnosis not present

## 2019-02-09 DIAGNOSIS — I5043 Acute on chronic combined systolic (congestive) and diastolic (congestive) heart failure: Secondary | ICD-10-CM | POA: Diagnosis not present

## 2019-02-09 DIAGNOSIS — J449 Chronic obstructive pulmonary disease, unspecified: Secondary | ICD-10-CM | POA: Diagnosis not present

## 2019-02-10 ENCOUNTER — Telehealth (HOSPITAL_COMMUNITY): Payer: Self-pay | Admitting: Vascular Surgery

## 2019-02-10 NOTE — Telephone Encounter (Signed)
Left pt message giving PFT APPT 11/5, asked pt to call back to confirm and to get Covid test scheduled

## 2019-02-16 ENCOUNTER — Other Ambulatory Visit (HOSPITAL_COMMUNITY)
Admission: RE | Admit: 2019-02-16 | Discharge: 2019-02-16 | Disposition: A | Discharge status: Home / Self Care | Payer: PPO | Source: Ambulatory Visit | Attending: Internal Medicine | Admitting: Internal Medicine

## 2019-02-16 DIAGNOSIS — Z20828 Contact with and (suspected) exposure to other viral communicable diseases: Secondary | ICD-10-CM | POA: Diagnosis not present

## 2019-02-16 DIAGNOSIS — Z01812 Encounter for preprocedural laboratory examination: Secondary | ICD-10-CM | POA: Diagnosis not present

## 2019-02-17 DIAGNOSIS — I5043 Acute on chronic combined systolic (congestive) and diastolic (congestive) heart failure: Secondary | ICD-10-CM | POA: Diagnosis not present

## 2019-02-17 DIAGNOSIS — J449 Chronic obstructive pulmonary disease, unspecified: Secondary | ICD-10-CM | POA: Diagnosis not present

## 2019-02-17 DIAGNOSIS — R0902 Hypoxemia: Secondary | ICD-10-CM | POA: Diagnosis not present

## 2019-02-17 DIAGNOSIS — C3492 Malignant neoplasm of unspecified part of left bronchus or lung: Secondary | ICD-10-CM | POA: Diagnosis not present

## 2019-02-17 LAB — NOVEL CORONAVIRUS, NAA (HOSP ORDER, SEND-OUT TO REF LAB; TAT 18-24 HRS): SARS-CoV-2, NAA: NOT DETECTED

## 2019-02-18 DIAGNOSIS — R202 Paresthesia of skin: Secondary | ICD-10-CM | POA: Diagnosis not present

## 2019-02-19 ENCOUNTER — Ambulatory Visit (HOSPITAL_COMMUNITY)
Admission: RE | Admit: 2019-02-19 | Discharge: 2019-02-19 | Disposition: A | Discharge status: Home / Self Care | Payer: PPO | Source: Ambulatory Visit | Attending: Internal Medicine | Admitting: Internal Medicine

## 2019-02-19 ENCOUNTER — Other Ambulatory Visit: Payer: Self-pay

## 2019-02-19 DIAGNOSIS — I5042 Chronic combined systolic (congestive) and diastolic (congestive) heart failure: Secondary | ICD-10-CM | POA: Insufficient documentation

## 2019-02-19 LAB — PULMONARY FUNCTION TEST
DL/VA % pred: 63 %
DL/VA: 2.58 ml/min/mmHg/L
DLCO unc % pred: 51 %
DLCO unc: 9.75 ml/min/mmHg
FEF 25-75 Pre: 0.59 L/sec
FEF2575-%Pred-Pre: 39 %
FEV1-%Pred-Pre: 62 %
FEV1-Pre: 1.25 L
FEV1FVC-%Pred-Pre: 83 %
FEV6-%Pred-Pre: 76 %
FEV6-Pre: 1.96 L
FEV6FVC-%Pred-Pre: 102 %
FVC-%Pred-Pre: 75 %
FVC-Pre: 2.03 L
Pre FEV1/FVC ratio: 62 %
Pre FEV6/FVC Ratio: 97 %
RV % pred: 158 %
RV: 3.72 L
TLC % pred: 111 %
TLC: 5.65 L

## 2019-02-19 MED ORDER — ALBUTEROL SULFATE (2.5 MG/3ML) 0.083% IN NEBU
2.5000 mg | INHALATION_SOLUTION | Freq: Once | RESPIRATORY_TRACT | Status: AC
  Administered 2019-02-19: 16:00:00 2.5 mg via RESPIRATORY_TRACT

## 2019-02-23 ENCOUNTER — Telehealth (HOSPITAL_COMMUNITY): Payer: Self-pay

## 2019-02-23 NOTE — Telephone Encounter (Signed)
Pts niece [name] called about not receiving a call for sleep study.  Emailed [facility], pt information was not on file.  Order, OV note, stop bang and demographics all faxed to [facility] at [phone] via epic

## 2019-02-24 ENCOUNTER — Telehealth (HOSPITAL_COMMUNITY): Payer: Self-pay

## 2019-02-24 DIAGNOSIS — Z87891 Personal history of nicotine dependence: Secondary | ICD-10-CM | POA: Diagnosis not present

## 2019-02-24 DIAGNOSIS — R0603 Acute respiratory distress: Secondary | ICD-10-CM | POA: Diagnosis not present

## 2019-02-24 DIAGNOSIS — I10 Essential (primary) hypertension: Secondary | ICD-10-CM | POA: Diagnosis not present

## 2019-02-24 DIAGNOSIS — R000 Tachycardia, unspecified: Secondary | ICD-10-CM | POA: Diagnosis not present

## 2019-02-24 NOTE — Telephone Encounter (Signed)
Received a Request for [facility] assessment, consult notes, height and weight. Faxed information to [phone].

## 2019-02-26 ENCOUNTER — Other Ambulatory Visit: Payer: Self-pay | Admitting: *Deleted

## 2019-02-27 ENCOUNTER — Encounter (HOSPITAL_COMMUNITY): Payer: Self-pay

## 2019-02-28 DIAGNOSIS — Z85118 Personal history of other malignant neoplasm of bronchus and lung: Secondary | ICD-10-CM | POA: Diagnosis not present

## 2019-02-28 DIAGNOSIS — I272 Pulmonary hypertension, unspecified: Secondary | ICD-10-CM | POA: Diagnosis not present

## 2019-02-28 DIAGNOSIS — J449 Chronic obstructive pulmonary disease, unspecified: Secondary | ICD-10-CM | POA: Diagnosis not present

## 2019-02-28 DIAGNOSIS — I69398 Other sequelae of cerebral infarction: Secondary | ICD-10-CM | POA: Diagnosis not present

## 2019-03-02 ENCOUNTER — Other Ambulatory Visit (HOSPITAL_COMMUNITY): Payer: Self-pay | Admitting: *Deleted

## 2019-03-02 MED ORDER — TORSEMIDE 20 MG PO TABS: ORAL_TABLET | ORAL | 3 refills | Status: DC

## 2019-03-02 NOTE — Progress Notes (Signed)
PATIENT: Caitlin Oliver DOB: 01/22/1941  REASON FOR VISIT: follow up HISTORY FROM: patient  HISTORY OF PRESENT ILLNESS: Today 03/03/19  HISTORY HPI: Caitlin Oliver is a 78 y.o. female, follow up for Dr. Erlinda Hong.  She had past medical history of hypertension, hyperlipidemia, peripheral vascular disease,   s/p diamondback orbital rotational atherectomy, PTA and stenting using a cast stents of her iliac arteries by Dr. Judithann Sauger 07/27/13 resulted in complete resolution of her claudication symptoms and normalization of her Dopplers.   She had bilateral carotid artery stenosis in the past, status post bilateral carotid artery endarterectomy in 2014 6 weeks apart by Dr.Brabham. Followed with Dr. Gwenlyn Found and CUS on 09/01/14 showed left ICA 50-69% stenosis with left VA retrograde flow. She had recent CUS on 04/27/15 showed right ICA 1-39% stenosis but left ICA progressed to 60-79% stenosis with left VA retrograde flow.  She was also diagnosed with left subclavian steal syndrome, presented with sudden onset of dizziness, loss of balance, had left subclavian artery stenting by Dr.Braham in 2017,  She had GI bleeding in 2016 taking aspirin and the Plavix after stents placed in lower extremities, required 3 units of blood transfusion. GI workup showed bleeding ulcer in the stomach. She was told not to take any Plavix anymore. She is on aspirin 81 currently.    In 2017, she complains of persistent left-sided occipital pain, was diagnosed with left occipital neuralgia, had radiofrequency ablation of bilateral occipital nerve by Dr. Luan Pulling in 2018, which has helped her symptoms,  During her last visit with Dr. Erlinda Hong in February 2019, she began to complains of recurrent neck pain, from being achy pain in both arms and legs, weakness of 4 extremities intermittently, has to rely on her walker sometimes,  Work up for her limb proximal weakness including ESR, CRP, CK, ANA, aldolase, AchR abs, EMG/NCS all negative.  Only A1C was 8.1.    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   Virtual Visit 08/14/2018 Dr. Krista Blue: She is at home with her niece [name] during today's interview.   I was able to review her most recent hospital discharge on June 17, 2018, he was brought to the emergency room after had a brief episode of passing out while walking to her primary care's office, prior to admission, she was noted to have increased difficulty walking, breathing,  She had extensive evaluation, I personally reviewed MRI of the brain on June 17, 2018, punctuated 5 mm acute ischemic nonhemorrhagic cortical infarction at the right precentral gyri, underlying advanced cerebral white matter small vessel disease generalized atrophy  CT angiogram of head and neck: Severe atheromatous disease at the aortic arch and origin of the great vessels, 65% stenosis at the right brachiocephalic artery, 28% at proximal left common carotid artery, 60 to 70% at the right common carotid artery, 75% at proximal right subclavian artery, vascular stent was in place at the origin of the left subclavian artery with widely patent flow, sequently of previous bilateral carotid artery endarterectomy  without significant residual stenosis,  atherosclerotic stenosis up to 70% at the distal right internal carotid artery, no significant stenosis at the left internal carotid artery, relative sparing of the posterior circulation,  I went over medication list with patient, she is currently taking Plavix  75 mg daily, she was put on home oxygen, home physical therapy since hospital discharge,  She ambulate at home with a walker,  158/88, glucose 162,  Update March 03, 2019 SS: She is here today accompanied by family member, Linna Hoff, her niece Santiago Glad is on the phone.  They are here today to discuss recent CT scan that was ordered by her primary after persistent symptoms of dizziness, and nausea.  Apparently she went to the ED 01/27/2019 for dizziness, but waited several hours and left without being seen.  Apparently around this time, dizziness had become more significant, but she endorses chronic dizziness.  She said she also had blurred vision and nausea.  She says the dizziness is not associated with movement.  She saw ENT, was told she had a MRSA infection in her salivary gland on the left side. She lives alone, she has not been driving a car since worsening dizziness. She uses a walker, is on continuous oxygen for pulmonary hypertension.  She endorses generalized weakness, no focal symptoms.  She remains on Plavix, 81 mg aspirin at night.  She has chronic diastolic heart failure.  Overall, dizziness has worsened the last 10 to 12 weeks, present even while sitting. She recently had palpations and chest pain, her metoprolol was increased, no longer has symptoms.   CT scan of the brain 02/02/2019: IMPRESSION:  1. No evidence of acute infarct or intracranial hemorrhage.  2. Severe chronic small vessel ischemic disease.  3. Small calcifications in the right sylvian fissure, likely  vascular (possibly calcified emboli) and new from 09/23/2018. No  evidence of associated infarct on this CT.    REVIEW OF SYSTEMS: Out of a complete 14 system review of symptoms, the patient complains only of the following symptoms, and all other reviewed systems are negative.  Dizziness  ALLERGIES: No Known Allergies  HOME MEDICATIONS: Outpatient Medications Prior to Visit  Medication Sig Dispense Refill    albuterol (PROVENTIL HFA;VENTOLIN HFA) 108 (90 Base) MCG/ACT inhaler Inhale 2 puffs into the lungs every 6 (six) hours as needed for wheezing or shortness of breath.      aspirin 81 MG chewable tablet Chew by mouth daily.     BIOTIN 5000 PO Take 5,000 mg by mouth daily before supper.      calcium carbonate (CALCIUM 600) 600 MG TABS tablet Take 600 mg by mouth daily before supper.      clopidogrel (PLAVIX) 75 MG tablet Take 1 tablet (75 mg total) by mouth daily.     gabapentin (NEURONTIN) 100 MG capsule Take 1 capsule (100 mg total) by mouth 3 (three) times daily. (Patient taking differently: Take 100 mg by mouth. 100 mg in the morning and 200 at lunch and bedtime) 90 capsule 0   HYDROcodone-acetaminophen (NORCO/VICODIN) 5-325 MG tablet Take 1 tablet by mouth every 12 (twelve) hours as needed for moderate pain. 10 tablet 0   lisinopril (ZESTRIL) 2.5 MG tablet Take 1 tablet (2.5 mg total) by mouth daily. 30 tablet 0   MAGNESIUM PO Take 1 tablet by mouth daily before supper.     meclizine (ANTIVERT) 12.5 MG tablet Take 12.5 mg by mouth 3 (three) times daily as needed for dizziness or nausea.  Menthol, Topical Analgesic, (ICY HOT EX) Apply 1 spray topically every 8 (eight) hours as needed (pain).     metFORMIN (GLUCOPHAGE) 500 MG tablet Take by mouth 2 (two) times daily with a meal.     metoprolol tartrate (LOPRESSOR) 50 MG tablet Take 50 mg by mouth 2 (two) times daily.     Omega-3 Fatty Acids (FISH OIL PO) Take 1 capsule by mouth daily.     ondansetron (ZOFRAN) 4 MG tablet Take 4 mg by mouth at bedtime as needed for nausea or vomiting.      ONE TOUCH ULTRA TEST test strip      pantoprazole (PROTONIX) 40 MG tablet Take 40 mg by mouth 2 (two) times daily.      polyethylene glycol (MIRALAX / GLYCOLAX) 17 g packet Take by mouth.     pravastatin (PRAVACHOL) 40 MG tablet Take 40 mg by mouth daily before supper.      torsemide (DEMADEX) 20 MG tablet Take 25m in the AM and 274min  the PM. 90 tablet 3   traZODone (DESYREL) 50 MG tablet Take 50 mg by mouth at bedtime.     budesonide (PULMICORT) 0.25 MG/2ML nebulizer solution Take 2 mLs (0.25 mg total) by nebulization 2 (two) times daily. (Patient not taking: Reported on 03/03/2019) 60 mL 12   metoprolol tartrate (LOPRESSOR) 25 MG tablet Take 1 tablet (25 mg total) by mouth 2 (two) times daily with a meal. 60 tablet 0   No facility-administered medications prior to visit.     PAST MEDICAL HISTORY: Past Medical History:  Diagnosis Date   Anxiety    Car[place] artery disease ([place]   Claudication ([facility]   COPD (chronic [place] disease) ([place]   Diabetes mellitus    Dizzy    GERD (gastroesophageal reflux disease)    History of kidney stones    Hypercholesteremia    Hypertension    Peripheral arterial disease ([facility]   bilateral iliac artery stenosis by angiography   Stomach ulcer    Tobacco abuse     PAST SURGICAL HISTORY: Past Surgical History:  Procedure Laterality Date   ABDOMINAL HYSTERECTOMY     APPENDECTOMY     BREAST REDUCTION SURGERY     CAROTID ANGIOGRAM N/A 02/25/2013   Procedure: CAROTID ANGIOGRAM;  Surgeon: JoLorretta HarpMD;  [facility];  Service: Cardiovascular;  Laterality: N/A;   ENDARTERECTOMY Right 03/06/2013   Procedure: ENDARTERECTOMY CAROTID-RIGHT;  Surgeon: VaSerafina MitchellMD;  [place] [facility] Service: Vascular;  Laterality: Right;   ENDARTERECTOMY Left 05/07/2013   Procedure: LEFT CAROTID ARTERY ENDARTERECTOMY WITH VASCU-GUARD PATCH ANGIOPLASTY ;  Surgeon: VaSerafina MitchellMD;  [place] [facility] Service: Vascular;  Laterality: Left;   LOBECTOMY Left 02/03/2016   Procedure: LEFT UPPER LOBECTOMY;  Surgeon: StMelrose NakayamaMD;  [place] [facility] Service: Thoracic;  Laterality: Left;   LOWER EXTREMITY ANGIOGRAM N/A 02/25/2013   Procedure: LOWER EXTREMITY ANGIOGRAM;  Surgeon: JoLorretta HarpMD;  [facility];  Service:  Cardiovascular;  Laterality: N/A;   LOWER EXTREMITY ANGIOGRAM N/A 07/27/2013   Procedure: LOWER EXTREMITY ANGIOGRAM;  Surgeon: JoLorretta HarpMD;  [facility];  Service: Cardiovascular;  Laterality: N/A;   PATCH ANGIOPLASTY Right 03/06/2013   Procedure: PATCH ANGIOPLASTY of Right Carotid Artery using Vascu-Guard Patch;  Surgeon: VaSerafina MitchellMD;  [place] [facility] Service: Vascular;  Laterality: Right;   PERIPHERAL VASCULAR CATHETERIZATION N/A 11/15/2015  Procedure: Aortic Arch Angiography;  Surgeon: Serafina Mitchell, MD;  Location: [place] CV LAB;  Service: Cardiovascular;  Laterality: N/A;   PERIPHERAL VASCULAR CATHETERIZATION Bilateral 11/15/2015   Procedure: Carotid Angiography;  Surgeon: Serafina Mitchell, MD;  Location: [place] CV LAB;  Service: Cardiovascular;  Laterality: Bilateral;   PERIPHERAL VASCULAR CATHETERIZATION Left 11/15/2015   Procedure: Upper Extremity Angiography;  Surgeon: Serafina Mitchell, MD;  Location: [place] CV LAB;  Service: Cardiovascular;  Laterality: Left;   PERIPHERAL VASCULAR CATHETERIZATION Left 11/15/2015   Procedure: Peripheral Vascular Intervention;  Surgeon: Serafina Mitchell, MD;  Location: [place] CV LAB;  Service: Cardiovascular;  Laterality: Left;  subclavian    RIGHT/LEFT HEART CATH AND CORONARY ANGIOGRAPHY N/A 11/20/2018   Procedure: RIGHT/LEFT HEART CATH AND CORONARY ANGIOGRAPHY;  Surgeon: Nelva Bush, MD;  Location: [place] CV LAB;  Service: Cardiovascular;  Laterality: N/A;   SALIVARY GLAND SURGERY     scar tissue removed from left saliva glad   TUBAL LIGATION     VIDEO ASSISTED THORACOSCOPY (VATS)/WEDGE RESECTION Left 02/03/2016   Procedure: VIDEO ASSISTED THORACOSCOPY;  Surgeon: [name], MD;  Location: [facility] OR;  Service: Thoracic;  Laterality: Left;    FAMILY HISTORY: Family History  Problem Relation Age of Onset   Stroke Mother    Hypertension Mother    Kidney failure Father     SOCIAL  HISTORY: Social History   Socioeconomic History   Marital status: Widowed    Spouse name: Not on file   Number of children: Not on file   Years of education: Not on file   Highest education level: Not on file  Occupational History   Not on file  Social Needs   Financial resource strain: Not on file   Food insecurity    Worry: Not on file    Inability: Not on file   Transportation needs    Medical: Not on file    Non-medical: Not on file  Tobacco Use   Smoking status: Former Smoker    Packs/day: 1.50    Years: 56.00    Pack years: 84.00    Types: Cigarettes    Quit date: 07/07/2013    Years since quitting: 5.6   Smokeless tobacco: Former Systems developer    Quit date: 02/25/2013  Substance and Sexual Activity   Alcohol use: No   Drug use: No   Sexual activity: Never  Lifestyle   Physical activity    Days per week: Not on file    Minutes per session: Not on file   Stress: Not on file  Relationships   Social connections    Talks on phone: Not on file    Gets together: Not on file    Attends religious service: Not on file    Active member of club or organization: Not on file    Attends meetings of clubs or organizations: Not on file    Relationship status: Not on file   Intimate partner violence    Fear of current or ex partner: Not on file    Emotionally abused: Not on file    Physically abused: Not on file    Forced sexual activity: Not on file  Other Topics Concern   Not on file  Social History Narrative   Not on file   PHYSICAL EXAM  Vitals:   03/03/19 1451  BP: 102/78  Pulse: 83  Temp: (!) 97.5 F (36.4 C)  TempSrc: Oral  Weight: 162 lb (73.5 kg)  Height: _0  (1.702 m)   Body mass index is 25.37 kg/m.  Generalized: Well developed, in no acute distress   Neurological examination  Mentation: Alert oriented to time, place, history taking. Follows all commands speech and language fluent Cranial nerve II-XII: Pupils were equal round  reactive to light. Extraocular movements were full, visual field were full on confrontational test. Facial sensation and strength were normal.  Head turning and shoulder shrug  were normal and symmetric. Motor: The motor testing reveals 5 over 5 strength of all 4 extremities. Good symmetric motor tone is noted throughout.  Sensory: Sensory testing is intact to soft touch on all 4 extremities. No evidence of extinction is noted.  Coordination: Cerebellar testing reveals good finger-nose-finger and heel-to-shin bilaterally.  Gait and station: Wears continuous nasal oxygen, in a wheelchair, able to stand, and ambulate short distance with standby assist Reflexes: Deep tendon reflexes are symmetric   DIAGNOSTIC DATA (LABS, IMAGING, TESTING) - I reviewed patient records, labs, notes, testing and imaging myself where available.  Lab Results  Component Value Date   WBC 8.9 01/27/2019   HGB 11.6 (L) 01/27/2019   HCT 37.0 01/27/2019   MCV 85.1 01/27/2019   PLT 304 01/27/2019      Component Value Date/Time   NA 131 (L) 01/27/2019 1904   NA 138 06/03/2017 1414   NA 138 03/12/2017 1121   K 3.6 01/27/2019 1904   K 4.6 03/12/2017 1121   CL 90 (L) 01/27/2019 1904   CO2 24 01/27/2019 1904   CO2 27 03/12/2017 1121   GLUCOSE 193 (H) 01/27/2019 1904   GLUCOSE 148 (H) 03/12/2017 1121   BUN 28 (H) 01/27/2019 1904   BUN 20 06/03/2017 1414   BUN 19.6 03/12/2017 1121   CREATININE 1.20 (H) 01/27/2019 1904   CREATININE 0.78 03/10/2018 1156   CREATININE 0.9 03/12/2017 1121   CALCIUM 9.4 01/27/2019 1904   CALCIUM 9.6 03/12/2017 1121   PROT 7.0 01/27/2019 1904   PROT 6.9 06/03/2017 1414   PROT 7.7 03/12/2017 1121   ALBUMIN 4.0 01/27/2019 1904   ALBUMIN 4.7 06/03/2017 1414   ALBUMIN 4.2 03/12/2017 1121   AST 23 01/27/2019 1904   AST 24 03/10/2018 1156   AST 38 (H) 03/12/2017 1121   ALT 16 01/27/2019 1904   ALT 29 03/10/2018 1156   ALT 38 03/12/2017 1121   ALKPHOS 59 01/27/2019 1904   ALKPHOS 91  03/12/2017 1121   BILITOT 0.7 01/27/2019 1904   BILITOT 0.4 03/10/2018 1156   BILITOT 0.44 03/12/2017 1121   GFRNONAA 43 (L) 01/27/2019 1904   GFRNONAA >60 03/10/2018 1156   GFRAA 50 (L) 01/27/2019 1904   GFRAA >60 03/10/2018 1156   Lab Results  Component Value Date   CHOL 99 06/17/2018   HDL 31 (L) 06/17/2018   LDLCALC 44 06/17/2018   TRIG 122 06/17/2018   CHOLHDL 3.2 06/17/2018   Lab Results  Component Value Date   HGBA1C 7.9 (H) 06/17/2018   Lab Results  Component Value Date   VITAMINB12 846 11/19/2011   Lab Results  Component Value Date   TSH 1.620 06/17/2018    ASSESSMENT AND PLAN 78 y.o. year old female  has a past medical history of Anxiety, Carotid artery disease ([place]), Claudication ([place]), COPD (chronic obstructive pulmonary disease) ([place]), Diabetes mellitus, Dizzy, GERD (gastroesophageal reflux disease), History of kidney stones, Hypercholesteremia, Hypertension, Peripheral arterial disease ([place]), Stomach ulcer, and Tobacco abuse. here with:  1.  Carotid artery disease, peripheral vascular disease, subclavian  artery stenosis, status post stent 2.  Dizziness, chronic, worsening over past few weeks -Status post bilateral carotid artery endarterectomy -History of GI bleeding, was previously on aspirin 81 mg daily, since hospital admission in March 2020, now on Plavix 75 mg daily, apparently now back on 81 mg aspirin at bedtime -She has multiple vascular risk factors including hypertension, hyperlipidemia, diabetes, COPD, sedentary lifestyle -Abnormal CT scan of the brain 02/02/2019 showed severe chronic small vessel ischemic disease, small calcifications in the right sylvian fissure, likely vascular (possibly calcified emboli) new from June 2020 -Will order MRA head and neck without contrast for further evaluation of dizziness, no focal symptoms on exam -Follow-up in 3 months with Dr. Krista Blue or sooner if needed  I spent 15 minutes with the patient. 50% of this time was  spent discussing her plan of care.   Butler Denmark, AGNP-C, DNP 03/03/2019, 4:27 PM [facility] [address] [place], [place] [address] [phone]

## 2019-03-03 ENCOUNTER — Encounter: Payer: Self-pay | Admitting: Neurology

## 2019-03-03 ENCOUNTER — Ambulatory Visit (INDEPENDENT_AMBULATORY_CARE_PROVIDER_SITE_OTHER): Payer: PPO | Admitting: Neurology

## 2019-03-03 ENCOUNTER — Other Ambulatory Visit: Payer: Self-pay

## 2019-03-03 VITALS — BP 102/78 | HR 83 | Temp 97.5°F | Ht 67.0 in | Wt 162.0 lb

## 2019-03-03 DIAGNOSIS — I6523 Occlusion and stenosis of bilateral carotid arteries: Secondary | ICD-10-CM | POA: Diagnosis not present

## 2019-03-03 DIAGNOSIS — H814 Vertigo of central origin: Secondary | ICD-10-CM

## 2019-03-03 DIAGNOSIS — I639 Cerebral infarction, unspecified: Secondary | ICD-10-CM

## 2019-03-03 NOTE — Patient Instructions (Signed)
I will order the MRA of your head and neck for further evaluation of your dizziness.

## 2019-03-04 ENCOUNTER — Telehealth (HOSPITAL_COMMUNITY): Payer: Self-pay

## 2019-03-04 ENCOUNTER — Telehealth: Payer: Self-pay

## 2019-03-04 NOTE — Telephone Encounter (Signed)
Patient sent to GI via email, HTA NPR. DW

## 2019-03-04 NOTE — Progress Notes (Signed)
I have reviewed and agreed above plan. 

## 2019-03-04 NOTE — Telephone Encounter (Signed)
ReFaxed ov notes, demographics, order and stop bang to [facility] @ [phone].

## 2019-03-05 ENCOUNTER — Other Ambulatory Visit: Payer: Self-pay | Admitting: Neurology

## 2019-03-05 DIAGNOSIS — Z87891 Personal history of nicotine dependence: Secondary | ICD-10-CM | POA: Diagnosis not present

## 2019-03-05 DIAGNOSIS — K219 Gastro-esophageal reflux disease without esophagitis: Secondary | ICD-10-CM | POA: Diagnosis not present

## 2019-03-05 DIAGNOSIS — H814 Vertigo of central origin: Secondary | ICD-10-CM

## 2019-03-05 DIAGNOSIS — E7800 Pure hypercholesterolemia, unspecified: Secondary | ICD-10-CM | POA: Diagnosis not present

## 2019-03-05 DIAGNOSIS — E119 Type 2 diabetes mellitus without complications: Secondary | ICD-10-CM | POA: Diagnosis not present

## 2019-03-05 DIAGNOSIS — I10 Essential (primary) hypertension: Secondary | ICD-10-CM | POA: Diagnosis not present

## 2019-03-05 DIAGNOSIS — Z7984 Long term (current) use of oral hypoglycemic drugs: Secondary | ICD-10-CM | POA: Diagnosis not present

## 2019-03-05 DIAGNOSIS — E785 Hyperlipidemia, unspecified: Secondary | ICD-10-CM | POA: Diagnosis not present

## 2019-03-05 DIAGNOSIS — N898 Other specified noninflammatory disorders of vagina: Secondary | ICD-10-CM | POA: Diagnosis not present

## 2019-03-05 NOTE — Progress Notes (Signed)
I changed the order to MRA head and neck without contrast.

## 2019-03-05 NOTE — Progress Notes (Signed)
I resent new order to GI. DW

## 2019-03-09 ENCOUNTER — Ambulatory Visit: Payer: PPO | Admitting: Internal Medicine

## 2019-03-09 ENCOUNTER — Ambulatory Visit (HOSPITAL_COMMUNITY): Payer: PPO

## 2019-03-09 ENCOUNTER — Other Ambulatory Visit: Payer: PPO

## 2019-03-09 ENCOUNTER — Telehealth: Payer: Self-pay | Admitting: Family Medicine

## 2019-03-09 NOTE — Telephone Encounter (Signed)
Patient called to request that she be sedated for her MRI since she is claustrophobic and will need to be in the tube for about an hour.. Please follow up.

## 2019-03-10 MED ORDER — ALPRAZOLAM 0.5 MG PO TABS: ORAL_TABLET | ORAL | 0 refills | Status: DC

## 2019-03-10 NOTE — Telephone Encounter (Signed)
She may take Xanax 0.5 mg, take 1-2 tablets 45 minutes before MRI, take a 3rd if needed.  Sent to [employer].

## 2019-03-10 NOTE — Telephone Encounter (Signed)
I called pt and relayed that xanax 0.60m tabs order # 3. To use for her MRA.  No driving with this.  She verbalized understanding.  (she stated she does not drive anyway).

## 2019-03-10 NOTE — Addendum Note (Signed)
Addended by: Suzzanne Cloud on: 03/10/2019 09:06 AM   Modules accepted: Orders

## 2019-03-16 DIAGNOSIS — R7981 Abnormal blood-gas level: Secondary | ICD-10-CM | POA: Diagnosis not present

## 2019-03-17 ENCOUNTER — Telehealth (HOSPITAL_COMMUNITY): Payer: Self-pay

## 2019-03-17 NOTE — Telephone Encounter (Signed)
Called Patient and left a message regarding her [facility], to inquire why she wasn't answering the phone but she did not answer for me. LMOM asking her to return call.

## 2019-03-20 ENCOUNTER — Telehealth (HOSPITAL_COMMUNITY): Payer: Self-pay

## 2019-03-20 NOTE — Telephone Encounter (Signed)
[facility] Voided after Multiple Attempts to contact the Patient by myself and the company.

## 2019-03-30 DIAGNOSIS — J449 Chronic obstructive pulmonary disease, unspecified: Secondary | ICD-10-CM | POA: Diagnosis not present

## 2019-03-30 DIAGNOSIS — I272 Pulmonary hypertension, unspecified: Secondary | ICD-10-CM | POA: Diagnosis not present

## 2019-03-30 DIAGNOSIS — I69398 Other sequelae of cerebral infarction: Secondary | ICD-10-CM | POA: Diagnosis not present

## 2019-03-30 DIAGNOSIS — Z85118 Personal history of other malignant neoplasm of bronchus and lung: Secondary | ICD-10-CM | POA: Diagnosis not present

## 2019-03-31 ENCOUNTER — Other Ambulatory Visit: Payer: Self-pay

## 2019-03-31 DIAGNOSIS — I739 Peripheral vascular disease, unspecified: Secondary | ICD-10-CM

## 2019-04-01 ENCOUNTER — Telehealth (HOSPITAL_COMMUNITY): Payer: Self-pay

## 2019-04-01 ENCOUNTER — Other Ambulatory Visit: Payer: Self-pay

## 2019-04-01 ENCOUNTER — Ambulatory Visit
Admission: RE | Admit: 2019-04-01 | Discharge: 2019-04-01 | Disposition: A | Discharge status: Home / Self Care | Payer: PPO | Source: Ambulatory Visit | Attending: Neurology | Admitting: Neurology

## 2019-04-01 DIAGNOSIS — H814 Vertigo of central origin: Secondary | ICD-10-CM

## 2019-04-01 MED ORDER — GADOBENATE DIMEGLUMINE 529 MG/ML IV SOLN
15.0000 mL | Freq: Once | INTRAVENOUS | Status: AC | PRN
  Administered 2019-04-01: 16:00:00 15 mL via INTRAVENOUS

## 2019-04-01 NOTE — Telephone Encounter (Signed)
The above patient or their representative was contacted and gave the following answers to these questions:         Do you have any of the following symptoms?    NO  Fever                    Cough                   Shortness of breath  Do  you have any of the following other symptoms?    muscle pain         vomiting,        diarrhea        rash         weakness        red eye        abdominal pain         bruising          bruising or bleeding              joint pain           severe headache    Have you been in contact with someone who was or has been sick in the past 2 weeks?  NO  Yes                 Unsure                         Unable to assess   Does the person that you were in contact with have any of the following symptoms?   Cough         shortness of breath           muscle pain         vomiting,            diarrhea            rash            weakness           fever            red eye           abdominal pain           bruising  or  bleeding                joint pain                severe headache                 COMMENTS OR ACTION PLAN FOR THIS PATIENT:

## 2019-04-02 ENCOUNTER — Ambulatory Visit (INDEPENDENT_AMBULATORY_CARE_PROVIDER_SITE_OTHER)
Admission: RE | Admit: 2019-04-02 | Discharge: 2019-04-02 | Disposition: A | Discharge status: Home / Self Care | Payer: PPO | Source: Ambulatory Visit | Attending: Family | Admitting: Family

## 2019-04-02 ENCOUNTER — Other Ambulatory Visit: Payer: Self-pay

## 2019-04-02 ENCOUNTER — Ambulatory Visit: Payer: PPO | Admitting: Physician Assistant

## 2019-04-02 ENCOUNTER — Ambulatory Visit (HOSPITAL_COMMUNITY)
Admission: RE | Admit: 2019-04-02 | Discharge: 2019-04-02 | Disposition: A | Discharge status: Home / Self Care | Payer: PPO | Source: Ambulatory Visit | Attending: Family | Admitting: Family

## 2019-04-02 VITALS — BP 98/60 | HR 84 | Temp 97.6°F | Resp 20 | Ht 67.0 in | Wt 164.0 lb

## 2019-04-02 DIAGNOSIS — I6523 Occlusion and stenosis of bilateral carotid arteries: Secondary | ICD-10-CM

## 2019-04-02 DIAGNOSIS — I739 Peripheral vascular disease, unspecified: Secondary | ICD-10-CM | POA: Insufficient documentation

## 2019-04-02 NOTE — Progress Notes (Signed)
[facility] [place] HISTORY AND PHYSICAL   History of Present Illness:  Patient is a 78 y.o. year old female who presents for evaluation of bilateral LE pain and weakness.  She has undergone lower extremity revascularization in April 2015 by Dr. Gwenlyn Found.  She denise symptoms of claudication, no calf pain or rest pain.  She denise non healing wounds.    Risk factors included hypertension, hyperlipidemia and type 2 diabetes. She has a 75-pack-year history of tobacco abuse smoking one and a half packs a day however she has recently quit smoking prior to her interventions.. There's no family history of heart disease. She's never had a heart for stroke. She does complain of dyspnea but denies chest pain. She complains of bilateral lower extremity claudication while walking up an incline which is lifestyle limiting. I age of Imdur 02/25/13 revealing high-grade bilateral internal carotid artery stenosis, high-grade bilateral calcified iliac artery stenosis as most common femoral artery stenoses. She had staged right followed by left carotid endarterectomy performed by Dr.Brabhamwhich she has recovered from. Because of lifestyle limiting claudication she underwent diamondback orbital rotational atherectomy, PT A. And stenting using ICast covered stents by myself on 07/27/13 which resulted in complete resolution of her claudication symptoms.She has had left subclavian stenting by Dr. Trula Slade 11/15/15. In addition, she had left upper lobectomy by Dr. Roxan Hockey 02/03/16 because of stage IA squamous cell carcinoma. She has recovered from this.   Past Medical History:  Diagnosis Date  . Anxiety   . Carotid artery disease ([place])   . Claudication ([place])   . COPD (chronic obstructive pulmonary disease) ([place])   . Diabetes mellitus   . Dizzy   . GERD (gastroesophageal reflux disease)   . History of kidney stones   . Hypercholesteremia   . Hypertension   . Peripheral arterial disease ([facility])     bilateral iliac artery stenosis by angiography  . Stomach ulcer   . Tobacco abuse     Past Surgical History:  Procedure Laterality Date  . ABDOMINAL HYSTERECTOMY    . APPENDECTOMY    . BREAST REDUCTION SURGERY    . CAROTID ANGIOGRAM N/A 02/25/2013   Procedure: CAROTID ANGIOGRAM;  Surgeon: Lorretta Harp, MD;  Location: [facility] CATH LAB;  Service: Cardiovascular;  Laterality: N/A;  . ENDARTERECTOMY Right 03/06/2013   Procedure: ENDARTERECTOMY CAROTID-RIGHT;  Surgeon: Serafina Mitchell, MD;  Location: [place];  Service: Vascular;  Laterality: Right;  . ENDARTERECTOMY Left 05/07/2013   Procedure: LEFT CAROTID ARTERY ENDARTERECTOMY WITH VASCU-GUARD PATCH ANGIOPLASTY ;  Surgeon: Serafina Mitchell, MD;  Location: [place];  Service: Vascular;  Laterality: Left;  . LOBECTOMY Left 02/03/2016   Procedure: LEFT UPPER LOBECTOMY;  Surgeon: [name], MD;  Location: [place];  Service: Thoracic;  Laterality: Left;  . LOWER EXTREMITY ANGIOGRAM N/A 02/25/2013   Procedure: LOWER EXTREMITY ANGIOGRAM;  Surgeon: Lorretta Harp, MD;  Location: [facility] CATH LAB;  Service: Cardiovascular;  Laterality: N/A;  . LOWER EXTREMITY ANGIOGRAM N/A 07/27/2013   Procedure: LOWER EXTREMITY ANGIOGRAM;  Surgeon: Lorretta Harp, MD;  Location: [facility] CATH LAB;  Service: Cardiovascular;  Laterality: N/A;  . PATCH ANGIOPLASTY Right 03/06/2013   Procedure: PATCH ANGIOPLASTY of Right Carotid Artery using Vascu-Guard Patch;  Surgeon: Serafina Mitchell, MD;  Location: [place];  Service: Vascular;  Laterality: Right;  . PERIPHERAL VASCULAR CATHETERIZATION N/A 11/15/2015   Procedure: Aortic Arch Angiography;  Surgeon: Serafina Mitchell, MD;  Location: [place] CV LAB;  Service: Cardiovascular;  Laterality: N/A;  . PERIPHERAL VASCULAR CATHETERIZATION Bilateral 11/15/2015   Procedure: Carotid Angiography;  Surgeon: Serafina Mitchell, MD;  Location: [place] CV LAB;  Service: Cardiovascular;  Laterality: Bilateral;  . PERIPHERAL VASCULAR  CATHETERIZATION Left 11/15/2015   Procedure: Upper Extremity Angiography;  Surgeon: Serafina Mitchell, MD;  Location: [place] CV LAB;  Service: Cardiovascular;  Laterality: Left;  . PERIPHERAL VASCULAR CATHETERIZATION Left 11/15/2015   Procedure: Peripheral Vascular Intervention;  Surgeon: Serafina Mitchell, MD;  Location: [place] CV LAB;  Service: Cardiovascular;  Laterality: Left;  subclavian   . RIGHT/LEFT HEART CATH AND CORONARY ANGIOGRAPHY N/A 11/20/2018   Procedure: RIGHT/LEFT HEART CATH AND CORONARY ANGIOGRAPHY;  Surgeon: Nelva Bush, MD;  Location: [place] CV LAB;  Service: Cardiovascular;  Laterality: N/A;  . SALIVARY GLAND SURGERY     scar tissue removed from left saliva glad  . TUBAL LIGATION    . VIDEO ASSISTED THORACOSCOPY (VATS)/WEDGE RESECTION Left 02/03/2016   Procedure: VIDEO ASSISTED THORACOSCOPY;  Surgeon: [name], MD;  Location: [facility] OR;  Service: Thoracic;  Laterality: Left;    ROS:   General:  No weight loss, Fever, chills  HEENT: No recent headaches, no nasal bleeding, no visual changes, no sore throat  Neurologic: positve dizziness, blackouts, seizures. No recent symptoms of stroke or mini- stroke. No recent episodes of slurred speech, or temporary blindness.  Cardiac: No recent episodes of chest pain/pressure, no shortness of breath at rest.  No shortness of breath with exertion.  Denies history of atrial fibrillation or irregular heartbeat  Vascular: No history of rest pain in feet.  No history of claudication.  No history of non-healing ulcer, No history of DVT   Pulmonary: No home oxygen, no productive cough, no hemoptysis,  No asthma or wheezing  Musculoskeletal:  _0  Arthritis, _1  Low back pain,  [x ] Joint pain  Hematologic:No history of hypercoagulable state.  No history of easy bleeding.  No history of anemia  Gastrointestinal: No hematochezia or melena,  No gastroesophageal reflux, no trouble swallowing  Urinary: _2  chronic Kidney  disease, _3  on HD - _4  MWF or _5  TTHS, _6  Burning with urination, _7  Frequent urination, _8  Difficulty urinating;   Skin: No rashes  Psychological: No history of anxiety,  No history of depression  Social History Social History   Tobacco Use  . Smoking status: Former Smoker    Packs/day: 1.50    Years: 56.00    Pack years: 84.00    Types: Cigarettes    Quit date: 07/07/2013    Years since quitting: 5.7  . Smokeless tobacco: Former Systems developer    Quit date: 02/25/2013  Substance Use Topics  . Alcohol use: No  . Drug use: No    Family History Family History  Problem Relation Age of Onset  . Stroke Mother   . Hypertension Mother   . Kidney failure Father     Allergies  No Known Allergies   Current Outpatient Medications  Medication Sig Dispense Refill  . albuterol (PROVENTIL HFA;VENTOLIN HFA) 108 (90 Base) MCG/ACT inhaler Inhale 2 puffs into the lungs every 6 (six) hours as needed for wheezing or shortness of breath.     . ALPRAZolam (XANAX) 0.5 MG tablet Take 1-2 tablets 45 minutes before MRI, may take a 3rd if needed 3 tablet 0  . aspirin 81 MG chewable tablet Chew by mouth daily.    [name] BIOTIN 5000 PO Take 5,000 mg  by mouth daily before supper.     . budesonide (PULMICORT) 0.25 MG/2ML nebulizer solution Take 2 mLs (0.25 mg total) by nebulization 2 (two) times daily. 60 mL 12  . calcium carbonate (CALCIUM 600) 600 MG TABS tablet Take 600 mg by mouth daily before supper.     . clopidogrel (PLAVIX) 75 MG tablet Take 1 tablet (75 mg total) by mouth daily.    [name] gabapentin (NEURONTIN) 100 MG capsule Take 1 capsule (100 mg total) by mouth 3 (three) times daily. (Patient taking differently: Take 100 mg by mouth. 100 mg in the morning and 200 at lunch and bedtime) 90 capsule 0  . HYDROcodone-acetaminophen (NORCO/VICODIN) 5-325 MG tablet Take 1 tablet by mouth every 12 (twelve) hours as needed for moderate pain. 10 tablet 0  . lisinopril (ZESTRIL) 2.5 MG tablet Take 1 tablet (2.5 mg  total) by mouth daily. 30 tablet 0  . MAGNESIUM PO Take 1 tablet by mouth daily before supper.    . meclizine (ANTIVERT) 12.5 MG tablet Take 12.5 mg by mouth 3 (three) times daily as needed for dizziness or nausea.     . Menthol, Topical Analgesic, (ICY HOT EX) Apply 1 spray topically every 8 (eight) hours as needed (pain).    . metFORMIN (GLUCOPHAGE) 500 MG tablet Take by mouth 2 (two) times daily with a meal.    . metoprolol tartrate (LOPRESSOR) 50 MG tablet Take 50 mg by mouth 2 (two) times daily.    . Omega-3 Fatty Acids (FISH OIL PO) Take 1 capsule by mouth daily.    . ondansetron (ZOFRAN) 4 MG tablet Take 4 mg by mouth at bedtime as needed for nausea or vomiting.     . ONE TOUCH ULTRA TEST test strip     . pantoprazole (PROTONIX) 40 MG tablet Take 40 mg by mouth 2 (two) times daily.     . polyethylene glycol (MIRALAX / GLYCOLAX) 17 g packet Take by mouth.    . pravastatin (PRAVACHOL) 40 MG tablet Take 40 mg by mouth daily before supper.     . torsemide (DEMADEX) 20 MG tablet Take 55m in the AM and 212min the PM. 90 tablet 3  . traZODone (DESYREL) 50 MG tablet Take 50 mg by mouth at bedtime.     No current facility-administered medications for this visit.    Physical Examination  Vitals:   04/02/19 1515  BP: 98/60  Pulse: 84  Resp: 20  Temp: 97.6 F (36.4 C)  SpO2: 97%  Weight: 164 lb (74.4 kg)  Height: _0  (1.702 m)    Body mass index is 25.69 kg/m.  General:  Alert and oriented, no acute distress HEENT: Normal Neck: No bruit or JVD Pulmonary: Clear to auscultation bilaterally, low breath sounds B  Cardiac: Regular Rate and Rhythm without murmur Abdomen: Soft, non-tender, non-distended, no mass, no scars Skin: No rash Extremity Pulses:  2+ radial, brachial, femoral, dorsalis pedis, posterior tibial pulses bilaterally Musculoskeletal: No deformity or edema  Neurologic: Upper and lower extremity motor 4-/5 and symmetric  DATA:  ABI  Findings: +---------+------------------+-----+---------+--------+ Right    Rt Pressure (mmHg)IndexWaveform Comment  +---------+------------------+-----+---------+--------+ Brachial 46                     triphasic         +---------+------------------+-----+---------+--------+ PTA      103               1.12 triphasic         +---------+------------------+-----+---------+--------+  DP       77                0.84 triphasic         +---------+------------------+-----+---------+--------+ Great Toe57                0.62 Abnormal          +---------+------------------+-----+---------+--------+  +---------+------------------+-----+---------+-------+ Left     Lt Pressure (mmHg)IndexWaveform Comment +---------+------------------+-----+---------+-------+ Brachial 92                     triphasic        +---------+------------------+-----+---------+-------+ PTA      107               1.16 triphasic        +---------+------------------+-----+---------+-------+ DP       93                1.01 triphasic        +---------+------------------+-----+---------+-------+ Great Toe57                0.62 Abnormal         +---------+------------------+-----+---------+-------+  +-------+-----------+-----------+------------+------------+ ABI/TBIToday's ABIToday's TBIPrevious ABIPrevious TBI +-------+-----------+-----------+------------+------------+ Right  1.12       0.62       1.29        0.85         +-------+-----------+-----------+------------+------------+ Left   1.16       0.62       1.14        0.93         +-------+-----------+-----------+------------+------------+    +-----------+--------+-----+--------+---------+--------+ RIGHT      PSV cm/sRatioStenosisWaveform Comments +-----------+--------+-----+--------+---------+--------+ CFA Prox   103                  biphasic           +-----------+--------+-----+--------+---------+--------+ CFA Mid    139                  biphasic          +-----------+--------+-----+--------+---------+--------+ CFA Distal 142                  biphasic          +-----------+--------+-----+--------+---------+--------+ DFA        84                   biphasic          +-----------+--------+-----+--------+---------+--------+ SFA Prox   108                  triphasic         +-----------+--------+-----+--------+---------+--------+ SFA Mid    97                   triphasic         +-----------+--------+-----+--------+---------+--------+ SFA Distal 103                  triphasic         +-----------+--------+-----+--------+---------+--------+ POP Prox   61                   triphasic         +-----------+--------+-----+--------+---------+--------+ POP Mid    77                   triphasic         +-----------+--------+-----+--------+---------+--------+ POP Distal 82  triphasic         +-----------+--------+-----+--------+---------+--------+ ATA Distal 84                   triphasic         +-----------+--------+-----+--------+---------+--------+ PTA Prox   90                   triphasic         +-----------+--------+-----+--------+---------+--------+ PTA Mid    90                   triphasic         +-----------+--------+-----+--------+---------+--------+ PTA Distal 95                   triphasic         +-----------+--------+-----+--------+---------+--------+ PERO Prox  49                   biphasic          +-----------+--------+-----+--------+---------+--------+ PERO Mid   36                   biphasic          +-----------+--------+-----+--------+---------+--------+ PERO Distal39                   biphasic          +-----------+--------+-----+--------+---------+--------+     +-----------+--------+-----+--------+---------+--------+ LEFT       PSV cm/sRatioStenosisWaveform Comments +-----------+--------+-----+--------+---------+--------+ CFA Prox   96                   biphasic          +-----------+--------+-----+--------+---------+--------+ CFA Mid    144                  biphasic          +-----------+--------+-----+--------+---------+--------+ CFA Distal 146                  biphasic          +-----------+--------+-----+--------+---------+--------+ DFA        96                   biphasic          +-----------+--------+-----+--------+---------+--------+ SFA Prox   121                  biphasic          +-----------+--------+-----+--------+---------+--------+ SFA Mid    125                  biphasic          +-----------+--------+-----+--------+---------+--------+ SFA Distal 88                   biphasic          +-----------+--------+-----+--------+---------+--------+ POP Prox   93                   biphasic          +-----------+--------+-----+--------+---------+--------+ POP Mid    72                   biphasic          +-----------+--------+-----+--------+---------+--------+ POP Distal 55                   biphasic          +-----------+--------+-----+--------+---------+--------+ ATA Distal 72  triphasic         +-----------+--------+-----+--------+---------+--------+ PTA Prox   84                   biphasic          +-----------+--------+-----+--------+---------+--------+ PTA Mid    81                   biphasic          +-----------+--------+-----+--------+---------+--------+ PTA Distal 97                   biphasic          +-----------+--------+-----+--------+---------+--------+ PERO Prox  36                   biphasic          +-----------+--------+-----+--------+---------+--------+ PERO Mid   36                    biphasic          +-----------+--------+-----+--------+---------+--------+ PERO Distal32                   biphasic          +-----------+--------+-----+--------+---------+--------+      Summary: Right: No evidence of arterial stenosis in the right lower extremity.  Left: No evidence of arterial stenosis in the left lower extremity.  ASSESSMENT:  Weakness and girdle pain of unknown cause B LE.   Patent arterial blood flow with palpable pedal pulses.     PLAN:  She will need further follow up to help with her complaints of weakness, fatigue and pain in B LE.    F/U in 4 months for repeat carotid duplex.     Ruta Hinds, MD [facility] [place] Office: [phone] Pager: [phone]

## 2019-04-03 ENCOUNTER — Telehealth: Payer: Self-pay | Admitting: Neurology

## 2019-04-03 NOTE — Telephone Encounter (Signed)
Pt asking to be called with the results to her MRA as soon as the results are available.

## 2019-04-06 ENCOUNTER — Telehealth: Payer: Self-pay | Admitting: Neurology

## 2019-04-06 DIAGNOSIS — I6529 Occlusion and stenosis of unspecified carotid artery: Secondary | ICD-10-CM

## 2019-04-06 NOTE — Telephone Encounter (Signed)
I called the patient.  MRA of the head and neck were abnormal (reports are attached).  She has extensive vascular history. I have reached out to Dr. Leonie Man who is our vascular neurologist for review to determine next steps.  I will contact the patient tomorrow with an update.  MRA neck with and without contrast 04/01/2019 IMPRESSION:  MRA neck (with and without) demonstrating: - The right vertebral artery has no flow signal on 2D-TOF views. However the right vertebral artery is visualized on post-contrast 3D-TOF views, and may represent retrograde filling via contralateral vertebral artery and due to right subclavian steal phenomenon; the right subclavian artery has moderate-severe stenosis proximal to the right vertebral artery origin. - The right brachiocephalic trunk origin has moderate-severe decreased flow signal off the aortic arch. - The right common carotid origin also has severe decreased flow signal at its origin, but the remainder of the common carotid artery has no stenosis. - The left subclavian artery has severe stenosis with thread of flow in the region of known stent. - Findings appear to have progressed since CTA on 06/17/18, although these differences may be due to CT vs MR capabilities. Consider clinical correlation and conventional cerebral angiography.  ADDENDUM: - The right internal carotid artery is narrow in caliber from its origin up to the upper cervical region, where there is notable severe focal stenosis (> 70%)  just at the skull base; the right internal carotid artery continues superiorly intracranially, although with narrower caliber than the left internal carotid artery.  MRA head 04/01/2019 IMPRESSION:  MRA head (without) demonstrating: - Right internal carotid artery is not visualized and may represent proximal stenosis or occlusion. The distal right internal carotid artery terminus appears reconstituted via posterior communicating artery.

## 2019-04-06 NOTE — Telephone Encounter (Signed)
Dr. Clydene Fake recommendations:  She certainly has significant occlusive intra and extracranial disease and since she is being symptomatic with recurrent dizziness I think doing a diagnostic cerebral catheter angiogram would be beneficial to see if she has any endovascularly treatable stenosis. Advised her to maintain aggressive hydration and to avoid hypotension and keep systolic blood pressure goal greater than 120. She also needs to be on dual antiplatelet therapy aspirin and Plavix if not already on it.  I will order cerebral angiogram. She is already taking 81 mg aspirin, Plavix 75 mg daily.

## 2019-04-07 NOTE — Telephone Encounter (Signed)
I have sent referral to Dr. Arlean Hopping scheduler [place]. I noted that it will need to be done within the next two weeks per Dr. Leonie Man.

## 2019-04-07 NOTE — Telephone Encounter (Signed)
Pt called back.  I relayed per SS/NP note abut her Bp recommendations that she needs to hold her lisinopril now goal is greater then sys 120 per Dr. Leonie Man. She verbalized understanding.

## 2019-04-07 NOTE — Telephone Encounter (Signed)
I called and LMVM for pt to return call about the Bp med recommendations.

## 2019-04-07 NOTE — Telephone Encounter (Signed)
I called the patient's niece as patient requested.  Made aware will be ordering cerebral catheter angiogram to determine if any treatable stenosis.  She is already taking aspirin 81 mg daily, Plavix 75 mg daily.  She should continue these.  She should stay well-hydrated with water. We should get the patient schedule. Dr. Leonie Man recommended she have procedure in the next 1-2 weeks. Can we contact IR at [facility] to determine availability, make aware.

## 2019-04-07 NOTE — Telephone Encounter (Signed)
Please update the patient, also ask her to hold her lisinopril for now, her BP has been around 200 systolic, goal is greater than 120, per Dr. Leonie Man recommendation.

## 2019-04-13 ENCOUNTER — Telehealth (HOSPITAL_COMMUNITY): Payer: Self-pay

## 2019-04-13 ENCOUNTER — Other Ambulatory Visit (HOSPITAL_COMMUNITY): Payer: Self-pay | Admitting: Interventional Radiology

## 2019-04-13 DIAGNOSIS — H538 Other visual disturbances: Secondary | ICD-10-CM

## 2019-04-13 DIAGNOSIS — R42 Dizziness and giddiness: Secondary | ICD-10-CM

## 2019-04-13 NOTE — Telephone Encounter (Signed)
Called to schedule angio. Pt's niece will look at her schedule and call me back when they are ready to set up. AW

## 2019-04-23 IMAGING — CT CT ANGIO CHEST
2 of 8 series · 17 of 46 positions shown · IV contrast (omnipaque)
Comparison: Chest CT 03/10/2018.

CLINICAL DATA: 77-year-old female with history of shortness of
breath.

EXAM:
CT ANGIOGRAPHY CHEST WITH CONTRAST
TECHNIQUE: Multidetector CT imaging of the chest was performed using the
standard protocol during bolus administration of intravenous
contrast. Multiplanar CT image reconstructions and MIPs were
obtained to evaluate the vascular anatomy.
CONTRAST:  70mL OMNIPAQUE IOHEXOL 350 MG/ML SOLN

[Series 6: thins · axial · 0.77mm/px · z∈[-98,+169]mm · 14 of 295 slices shown]
[im 14/295  lung]
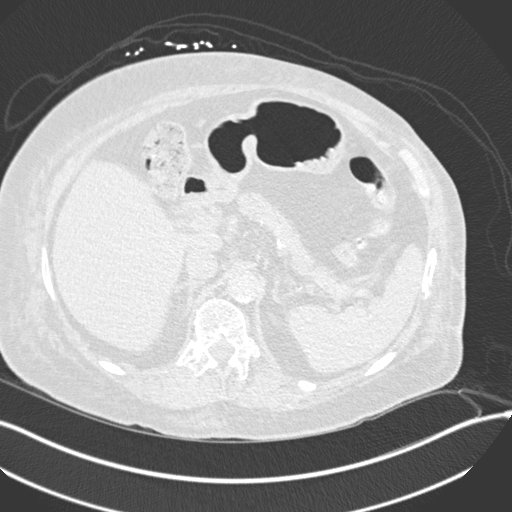
[im 41/295  soft-tissue]
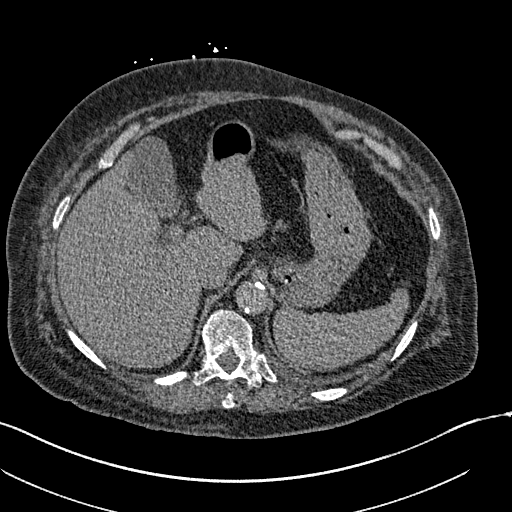
[im 54/295  lung]
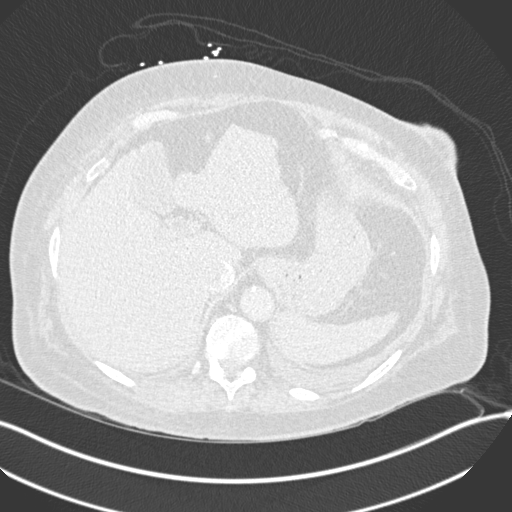
[im 81/295  soft-tissue]
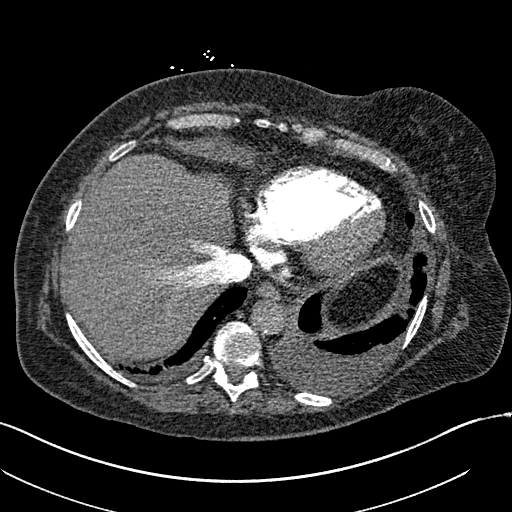
[im 94/295  lung]
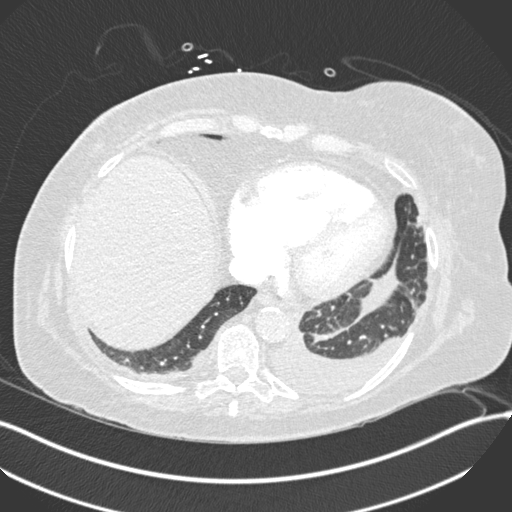
[im 121/295  soft-tissue]
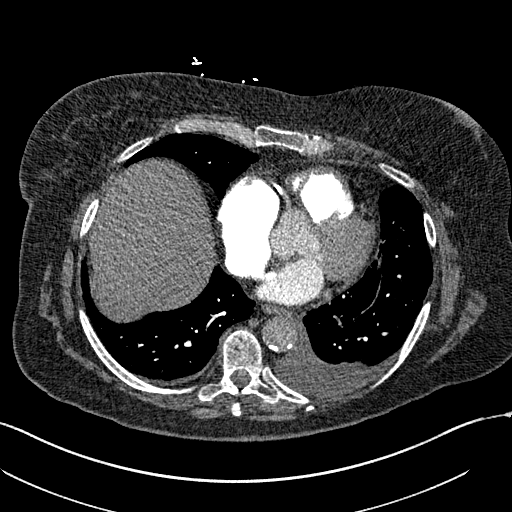
[im 134/295  lung]
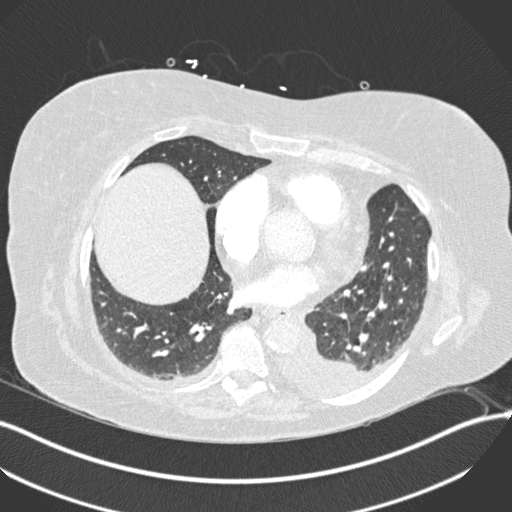
[im 161/295  soft-tissue]
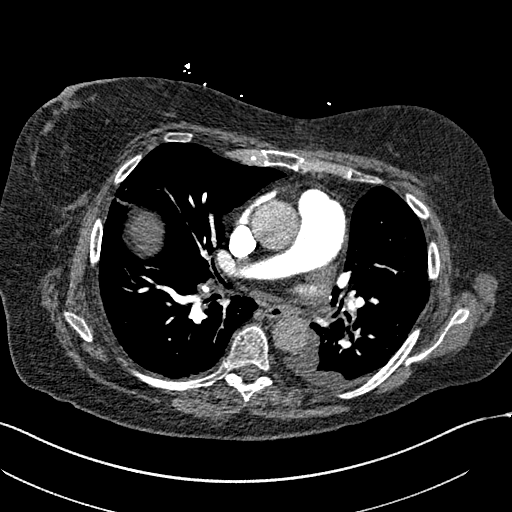
[im 174/295  lung]
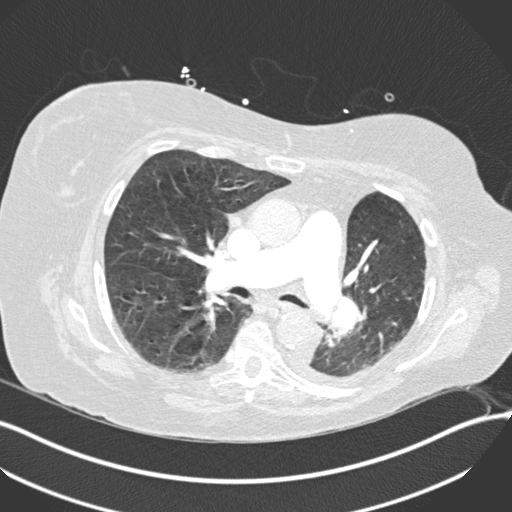
[im 201/295  soft-tissue]
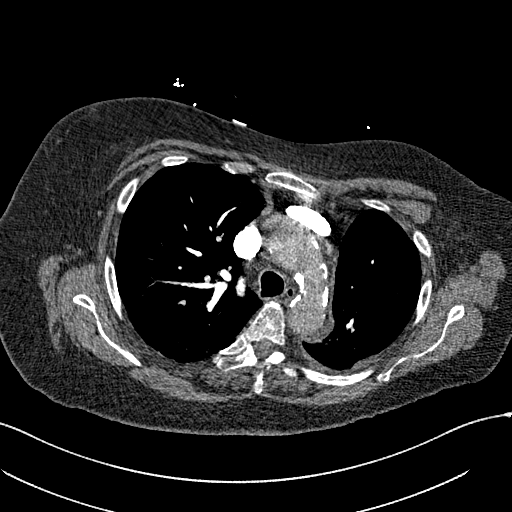
[im 214/295  lung]
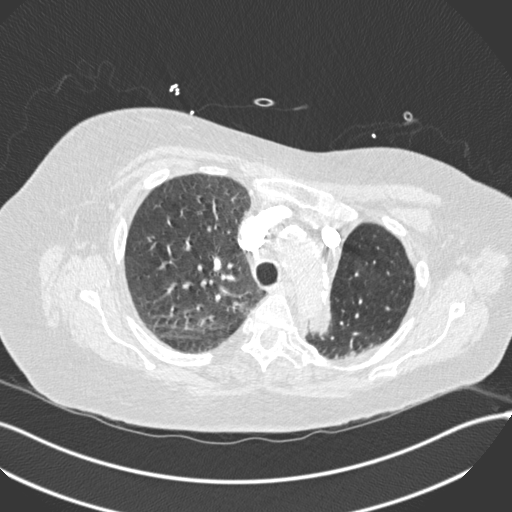
[im 241/295  soft-tissue]
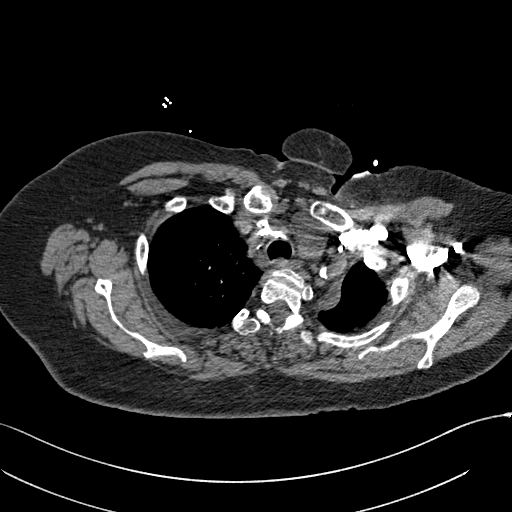
[im 254/295  lung]
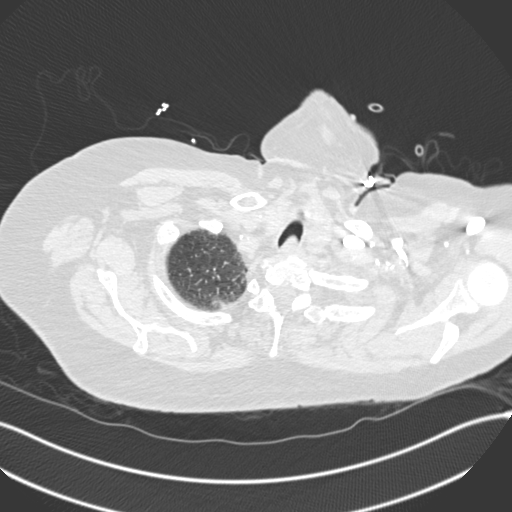
[im 281/295  soft-tissue]
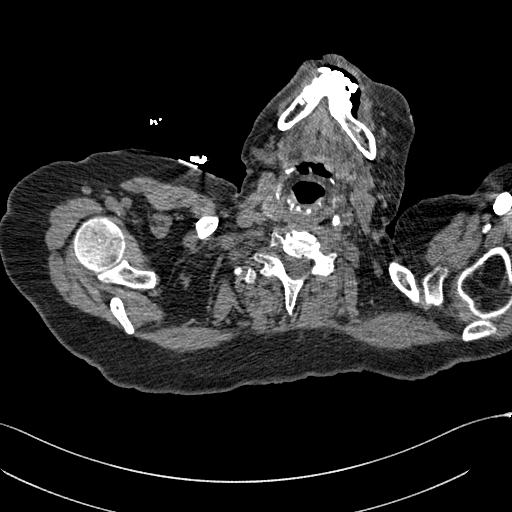

[Series 8: coronal mpr · coronal · 0.59mm/px · 3 of 151 slices shown]
[im 38/151  soft-tissue]
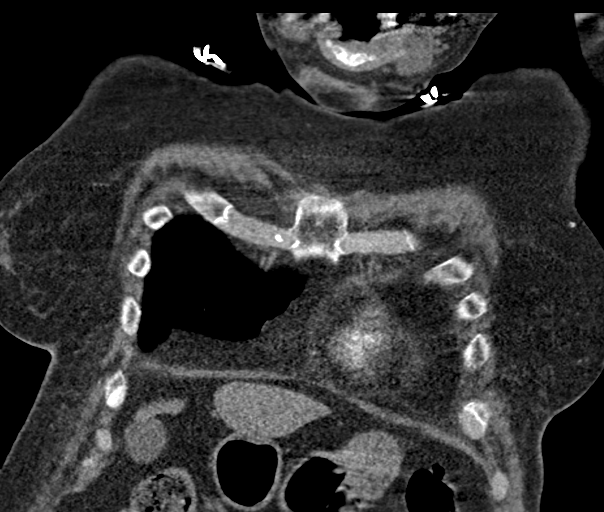
[im 76/151  soft-tissue]
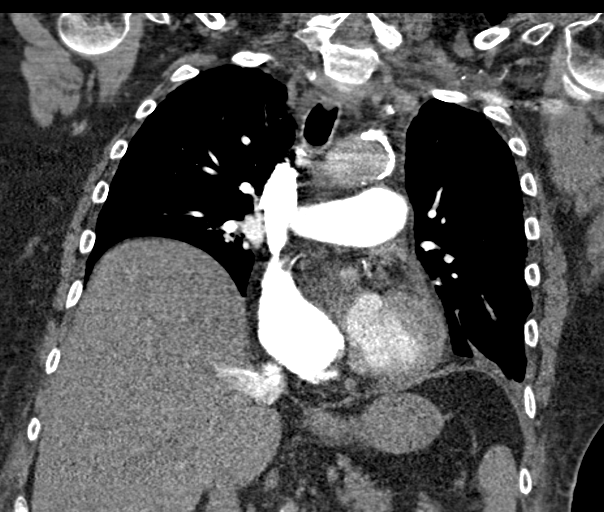
[im 113/151  soft-tissue]
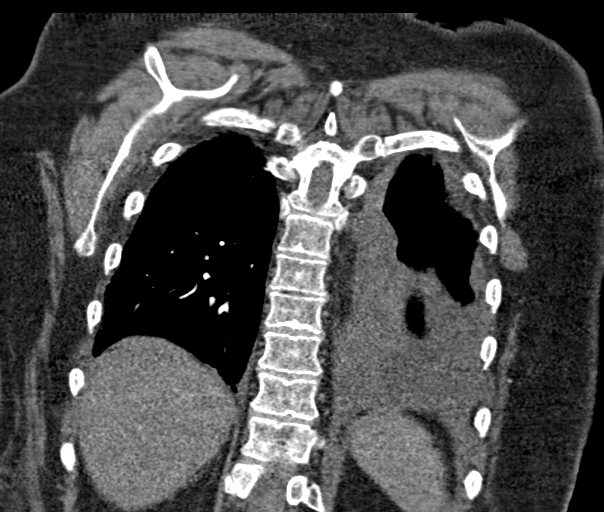

[17 of 46 positions shown; findings below may reference images not displayed]

FINDINGS: Cardiovascular: No filling defects within the pulmonary arterial
tree to suggest underlying pulmonary embolism. Heart size is mildly
enlarged with right atrial and right ventricular dilatation. There
is no significant pericardial fluid, thickening or pericardial
calcification. There is aortic atherosclerosis, as well as
atherosclerosis of the great vessels of the mediastinum and the
coronary arteries, including calcified atherosclerotic plaque in the
left main, left anterior descending, left circumflex and right
coronary arteries. Dilatation of the pulmonic trunk (3.6 cm in
diameter).

Mediastinum/Nodes: No pathologically enlarged mediastinal or hilar
lymph nodes. Esophagus is unremarkable in appearance. No axillary
lymphadenopathy.

Lungs/Pleura: Status post left upper lobectomy. No acute
consolidative airspace disease. Enlarging small left and trace right
pleural effusions. Previously noted small nodule in the superior
segment of the right lower lobe unchanged, measuring 7 x 5 mm (mean
diameter of 6 mm). Focal area of architectural distortion in the
lateral segment of the right middle lobe, favored to reflect an area
of atelectasis. Mild scarring and/or atelectasis in the base of the
left lower lobe.

Upper Abdomen: Diffuse low attenuation throughout the visualized
hepatic parenchyma, indicative of hepatic steatosis. Aortic
atherosclerosis.

Musculoskeletal: There are no aggressive appearing lytic or blastic
lesions noted in the visualized portions of the skeleton.

Review of the MIP images confirms the above findings.
IMPRESSION: 1. No evidence of pulmonary embolism.
2. Enlarging bilateral pleural effusions, small on the left and
trace on the right.
3. Cardiomegaly with right atrial and right ventricular dilatation,
as well as dilatation of the pulmonic trunk (3.6 cm in diameter).
Findings suggest pulmonary arterial hypertension.
4. Aortic atherosclerosis, in addition to left main and 3 vessel
coronary artery disease. Assessment for potential risk factor
modification, dietary therapy or pharmacologic therapy may be
warranted, if clinically indicated.
5. Stable 6 mm nodule in the superior segment of the right lower
lobe. This is reassuring, but the time interval is insufficient to
ensure a benign etiology. Repeat noncontrast chest CT is recommended
in 12 months to ensure continued stability.
6. Hepatic steatosis.

Aortic Atherosclerosis ([id number]-0AM.M).

## 2019-04-29 ENCOUNTER — Other Ambulatory Visit: Payer: Self-pay | Admitting: Radiology

## 2019-04-29 ENCOUNTER — Other Ambulatory Visit: Payer: Self-pay | Admitting: Physician Assistant

## 2019-04-30 ENCOUNTER — Encounter (HOSPITAL_COMMUNITY): Payer: Self-pay

## 2019-04-30 ENCOUNTER — Other Ambulatory Visit: Payer: Self-pay

## 2019-04-30 ENCOUNTER — Ambulatory Visit (HOSPITAL_COMMUNITY)
Admission: RE | Admit: 2019-04-30 | Discharge: 2019-04-30 | Disposition: A | Discharge status: Home / Self Care | Payer: PPO | Source: Ambulatory Visit | Attending: Interventional Radiology | Admitting: Interventional Radiology

## 2019-04-30 DIAGNOSIS — R42 Dizziness and giddiness: Secondary | ICD-10-CM | POA: Diagnosis not present

## 2019-04-30 DIAGNOSIS — Z85118 Personal history of other malignant neoplasm of bronchus and lung: Secondary | ICD-10-CM | POA: Diagnosis not present

## 2019-04-30 DIAGNOSIS — Z539 Procedure and treatment not carried out, unspecified reason: Secondary | ICD-10-CM | POA: Insufficient documentation

## 2019-04-30 DIAGNOSIS — I69398 Other sequelae of cerebral infarction: Secondary | ICD-10-CM | POA: Diagnosis not present

## 2019-04-30 DIAGNOSIS — H538 Other visual disturbances: Secondary | ICD-10-CM | POA: Insufficient documentation

## 2019-04-30 DIAGNOSIS — J449 Chronic obstructive pulmonary disease, unspecified: Secondary | ICD-10-CM | POA: Diagnosis not present

## 2019-04-30 DIAGNOSIS — I272 Pulmonary hypertension, unspecified: Secondary | ICD-10-CM | POA: Diagnosis not present

## 2019-04-30 LAB — BASIC METABOLIC PANEL
Anion gap: 15 (ref 5–15)
BUN: 22 mg/dL (ref 8–23)
CO2: 28 mmol/L (ref 22–32)
Calcium: 10 mg/dL (ref 8.9–10.3)
Chloride: 96 mmol/L — ABNORMAL LOW (ref 98–111)
Creatinine, Ser: 0.98 mg/dL (ref 0.44–1.00)
GFR calc Af Amer: >60 mL/min (ref >60)
GFR calc non Af Amer: 55 mL/min — ABNORMAL LOW (ref >60)
Glucose, Bld: 188 mg/dL — ABNORMAL HIGH (ref 70–99)
Potassium: 4.1 mmol/L (ref 3.5–5.1)
Sodium: 139 mmol/L (ref 135–145)

## 2019-04-30 LAB — PROTIME-INR
INR: 1 (ref 0.8–1.2)
Prothrombin Time: 12.6 seconds (ref 11.4–15.2)

## 2019-04-30 LAB — CBC
HCT: 37.9 % (ref 36.0–46.0)
Hemoglobin: 11.9 g/dL — ABNORMAL LOW (ref 12.0–15.0)
MCH: 27 pg (ref 26.0–34.0)
MCHC: 31.4 g/dL (ref 30.0–36.0)
MCV: 86.1 fL (ref 80.0–100.0)
Platelets: 333 10*3/uL (ref 150–400)
RBC: 4.4 MIL/uL (ref 3.87–5.11)
RDW: 13.2 % (ref 11.5–15.5)
WBC: 8.1 10*3/uL (ref 4.0–10.5)
nRBC: 0 % (ref 0.0–0.2)

## 2019-04-30 MED ORDER — HYDROCODONE-ACETAMINOPHEN 5-325 MG PO TABS
1.0000 | ORAL_TABLET | ORAL | Status: DC | PRN
  Administered 2019-04-30: 1 via ORAL

## 2019-04-30 MED ORDER — TORSEMIDE 20 MG PO TABS
40.0000 mg | ORAL_TABLET | Freq: Every day | ORAL | Status: DC
  Administered 2019-04-30: 40 mg via ORAL
  Filled 2019-04-30: qty 2

## 2019-04-30 MED ORDER — HYDROCODONE-ACETAMINOPHEN 5-325 MG PO TABS
ORAL_TABLET | ORAL | Status: AC
  Filled 2019-04-30: qty 1

## 2019-04-30 MED ORDER — SODIUM CHLORIDE 0.9 % IV SOLN
INTRAVENOUS | Status: DC
  Administered 2019-04-30: 20 mL/h via INTRAVENOUS

## 2019-04-30 NOTE — Discharge Instructions (Signed)
Cerebral Angiogram  A cerebral angiogram is a procedure that is used to examine the blood vessels in the brain and neck. Contrast dye is injected through a thin tube (catheter) into an artery. X-ray pictures are then taken. The pictures can show an abnormality in a blood vessel, such as a blockage, narrowing (stenosis), or bulging (aneurysm). Tell a health care provider about:  Any allergies you have, including allergies to medicines, shellfish, contrast dye, or iodine.  All medicines you are taking, including vitamins, herbs, eye drops, creams, and over-the-counter medicines.  Any blood thinning medicines you take, such as aspirin or warfarin.  Any problems you or family members have had with anesthetic medicines.  Any blood disorders you have.  Any surgeries you have had.  Any medical conditions you have or have had, including kidney problems or kidney failure.  Whether you are pregnant or may be pregnant, or are breastfeeding. What are the risks? Generally, this is a safe procedure. However, problems may occur, including:  Problems in the insertion site, such as bleeding, bruising, infection, or collection of blood under the skin (hematoma).  Allergic reaction to medicines or dyes.  Damage to nearby structures or organs, including blood vessels or arteries. Also, contrast dye can damage the kidneys.  Blood clot.  Weakness, numbness, speech, or vision problems. This is usually temporary.  Stroke.  Amnesia, or being unable to remember what happened (rare). This is temporary. What happens before the procedure? Staying hydrated Follow instructions from your healthcare provider about hydration, which may include:  Up to 2 hours before the procedure - you may continue to drink clear liquids, such as water, clear fruit juice, black coffee, and plain tea. Eating and drinking restrictions Follow instructions from your health care provider about eating and drinking, which may  include:  8 hours before the procedure - stop eating heavy meals or foods, such as meat, fried foods, or fatty foods.  6 hours before the procedure - stop eating light meals or foods, such as toast or cereal.  6 hours before the procedure - stop drinking milk or drinks that contain milk.  2 hours before the procedure - stop drinking clear liquids. Medicines Ask your health care provider about:  Changing or stopping your regular medicines. This is especially important if you are taking diabetes medicines or blood thinners.  Taking medicines such as aspirin and ibuprofen. These medicines can thin your blood. Do not take these medicines unless your health care provider tells you to take them.  Taking over-the-counter medicines, vitamins, herbs, and supplements. General instructions  Do not use any products that contain nicotine or tobacco for at least 4 weeks before the procedure. These products include cigarettes, e-cigarettes, and chewing tobacco. If you need help quitting, ask your health care provider.  You may have blood tests done.  Plan to have someone take you home from the hospital or clinic.  If you will be going home the same day of the procedure, plan to have someone with you for 24 hours.  Ask your health care provider what steps will be taken to prevent infection. These may include: ? Removing hair at the insertion site. ? Washing skin with a germ-killing soap. ? Taking antibiotic medicine. What happens during the procedure?  You will lie on an X-ray table. Your head and legs may be strapped down.  An IV will be inserted into one of your veins.  You will be given one or both of the following: ? A medicine  to help you relax (sedative). ? A medicine to numb the area (local anesthetic) where the catheter will be inserted, usually in your groin, leg, or arm.  Your heart rate and other vital signs will be watched carefully. Electrodes may be placed on your chest.  A  small incision will be made. The catheter will be moved through the incision up to the blood vessels in your neck and brain.  Dye will be injected into the catheter and will travel to the blood vessels of the brain and neck. You may notice a warm feeling or strange taste in your mouth.  You will be asked to lie still. X-rays will be taken to show the flow of the dye through the blood vessels in the brain and neck.  If an abnormality is found in a blood vessel, another procedure may be done to treat the problem.  Tell your health care provider if you develop chest pain or trouble breathing during the procedure.  When the images are finished, the catheter will be removed. Pressure will be applied to stop bleeding.  A closure device may be placed into the access site to form a seal. The seal stops bleeding and helps the artery heal.  A bandage (dressing)will be applied over the small opening in the skin.  Your IV will be removed. The procedure may vary among health care providers and hospitals. What happens after the procedure?  Your blood pressure, heart rate, breathing rate, and blood oxygen level will be monitored until you leave the hospital or clinic.  You will be asked to lie flat for several hours. You will keep the limb where the catheter was inserted straight.  The insertion site and the pulse in your foot or wrist will be checked often.  You will be told to drink plenty of fluids. This will help flush the contrast dye out of your system.  Do not drive for 24 hours if you were given a sedative during your procedure.  It is up to you to get the results of your procedure. Ask your health care provider, or the department that is doing the procedure, when your results will be ready. Summary  A cerebral angiogram is a procedure that checks the health of the blood vessels in the brain and neck.  You will be given a sedativeand a local anesthetic. You may feel pressure when the  catheter is inserted and warmth when the dye is injected.  Contrast dye is injected through a catheter into an artery. X-rays are taken to look for an abnormality, such as blockage or narrowing.  After the procedure, you will be asked to lie flat for several hours. Do not drive for 24 hours, or until a health care provider tells you to. This information is not intended to replace advice given to you by your health care provider. Make sure you discuss any questions you have with your health care provider. Document Revised: 10/21/2018 Document Reviewed: 10/21/2018 Elsevier Patient Education  [place]. Moderate Conscious Sedation, Adult Sedation is the use of medicines to promote relaxation and relieve discomfort and anxiety. Moderate conscious sedation is a type of sedation. Under moderate conscious sedation, you are less alert than normal, but you are still able to respond to instructions, touch, or both. Moderate conscious sedation is used during short medical and dental procedures. It is milder than deep sedation, which is a type of sedation under which you cannot be easily woken up. It is also milder than  general anesthesia, which is the use of medicines to make you unconscious. Moderate conscious sedation allows you to return to your regular activities sooner. Tell a health care provider about:  Any allergies you have.  All medicines you are taking, including vitamins, herbs, eye drops, creams, and over-the-counter medicines.  Use of steroids (by mouth or creams).  Any problems you or family members have had with sedatives and anesthetic medicines.  Any blood disorders you have.  Any surgeries you have had.  Any medical conditions you have, such as sleep apnea.  Whether you are pregnant or may be pregnant.  Any use of cigarettes, alcohol, marijuana, or street drugs. What are the risks? Generally, this is a safe procedure. However, problems may occur, including:  Getting  too much medicine (oversedation).  Nausea.  Allergic reaction to medicines.  Trouble breathing. If this happens, a breathing tube may be used to help with breathing. It will be removed when you are awake and breathing on your own.  Heart trouble.  Lung trouble. What happens before the procedure? Staying hydrated Follow instructions from your health care provider about hydration, which may include:  Up to 2 hours before the procedure - you may continue to drink clear liquids, such as water, clear fruit juice, black coffee, and plain tea. Eating and drinking restrictions Follow instructions from your health care provider about eating and drinking, which may include:  8 hours before the procedure - stop eating heavy meals or foods such as meat, fried foods, or fatty foods.  6 hours before the procedure - stop eating light meals or foods, such as toast or cereal.  6 hours before the procedure - stop drinking milk or drinks that contain milk.  2 hours before the procedure - stop drinking clear liquids. Medicine Ask your health care provider about:  Changing or stopping your regular medicines. This is especially important if you are taking diabetes medicines or blood thinners.  Taking medicines such as aspirin and ibuprofen. These medicines can thin your blood. Do not take these medicines before your procedure if your health care provider instructs you not to.  Tests and exams  You will have a physical exam.  You may have blood tests done to show: ? How well your kidneys and liver are working. ? How well your blood can clot. General instructions  Plan to have someone take you home from the hospital or clinic.  If you will be going home right after the procedure, plan to have someone with you for 24 hours. What happens during the procedure?  An IV tube will be inserted into one of your veins.  Medicine to help you relax (sedative) will be given through the IV tube.  The  medical or dental procedure will be performed. What happens after the procedure?  Your blood pressure, heart rate, breathing rate, and blood oxygen level will be monitored often until the medicines you were given have worn off.  Do not drive for 24 hours. This information is not intended to replace advice given to you by your health care provider. Make sure you discuss any questions you have with your health care provider. Document Revised: 03/15/2017 Document Reviewed: 07/23/2015 Elsevier Patient Education  2020 [employer].

## 2019-04-30 NOTE — H&P (Signed)
Chief Complaint: Patient was seen in consultation today for an image guided diagnostic cerebral angiogram.  Referring Physician(s): Butler Denmark, NP  Supervising Physician: Luanne Bras  Patient Status: [facility] - Out-pt  History of Present Illness: Caitlin Oliver is a 79 y.o. female with a past medical history significant for anxiety, GERD, previous GI bleed (2016), HTN, HLD, left occipital neuralgia s/p radiofrequency ablation of bilateral occipital nerve (2018), PAD s/p iliac artery stenting, tobacco use, COPD on chronic O2, subclavian steal syndrome and carotid artery disease s/p bilateral carotid endarterectomy (2014) who presents today for an image guided diagnostic cerebral angiogram. Caitlin Oliver has a significant cardiovascular history including bilateral carotid endarterectomy in 2014 with Dr. Trula Slade - carotid US performed 08/30/14 noted left ICA 50-69% stenosis with left VA retrograde flow and carotid US performed 04/27/15 noted right ICA 1-39% stenosis and left ICA progression of stenosis to 60-79% with left VA retrograde flow. She was diagnosed with subclavian steal syndrome in 2017 after a sudden onset of dizziness and loss of balance and she subsequently underwent left subclavian artery stenting with Dr. Trula Slade.   In March of 2020 she presented to the ED after passing out while walking and had recently been having worsening dyspnea and trouble walking. MRI of the brain was performed and noted punctuated 5 mm acute ischemic nonhemorrhagic cortical infarction at the right precentral gyri, underlying advanced cerebral white matter small vessel disease and generalized atrophy. CTA head/neck was negative for large vessel occlusion but noted severe atheromatous disease about the aortic arch and origin of the great vessels with resultant stenoses of up to 65% at the right brachiocephalic artery, 73% at the proximal left common carotid artery, 60-70% at the origin of the right common carotid  artery and 75% at the proximal right subclavian artery, vascular stent in place; atheromatous stenosis of up to 70% at the distal right ICA without significant stenosis of the left ICA. She was placed on Plavix + ASA and referred to neurology.   She then began to have persistent dizziness and nausea in October of 2020 and presented to the ED for evaluation, however she did not wish to wait and left without being seen. Since that time she reported to her neurologist that her dizziness and nausea have progressively worsened - she is now unable to walk more than a few feet and she no longer drives due to dizziness. She was seen by her neurologist in November of 2020 and an MRA head/neck was performed to further evaluate her symptoms. MRA notable for narrowing of the right ICA from its origin up to the upper cervical region where there is notable severe focal stenosis (>70%) just at the skull base and continues superiorly intracranially although with narrower caliber than the left ICA. IR has been consulted for an image guided cerebral angiogram with possible intervention.  Caitlin Oliver states she is nauseous and "swimmy headed" from the time she wakes up from the time she goes to bed. She describes the feeling as "constantly being on a rocking boat." She sometimes gets blurry vision which resolves after she blinks several times. She is very unsteady on her feet and uses a walker at home, she has fallen in the past because of dizziness but not recently. She has trouble walking to the bathroom and back to her couch due to dyspnea and dizziness. She no longer drives because of the dizziness. She is very hopeful that this procedure can help to provide some answers about why she  has been having these symptoms. She states understanding of the requested procedure and wishes to proceed.  Past Medical History:  Diagnosis Date  . Anxiety   . Carotid artery disease ([place])   . Claudication ([place])   . COPD (chronic  obstructive pulmonary disease) ([place])   . Diabetes mellitus   . Dizzy   . GERD (gastroesophageal reflux disease)   . History of kidney stones   . Hypercholesteremia   . Hypertension   . 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 Stomach ulcer   . Tobacco abuse     Past Surgical History:  Procedure Laterality Date  . ABDOMINAL HYSTERECTOMY    . APPENDECTOMY    . BREAST REDUCTION SURGERY    . CAROTID ANGIOGRAM N/A 02/25/2013   Procedure: CAROTID ANGIOGRAM;  Surgeon: Lorretta Harp, MD;  Location: [facility] CATH LAB;  Service: Cardiovascular;  Laterality: N/A;  . ENDARTERECTOMY Right 03/06/2013   Procedure: ENDARTERECTOMY CAROTID-RIGHT;  Surgeon: Serafina Mitchell, MD;  Location: [place];  Service: Vascular;  Laterality: Right;  . ENDARTERECTOMY Left 05/07/2013   Procedure: LEFT CAROTID ARTERY ENDARTERECTOMY WITH VASCU-GUARD PATCH ANGIOPLASTY ;  Surgeon: Serafina Mitchell, MD;  Location: [place];  Service: Vascular;  Laterality: Left;  . LOBECTOMY Left 02/03/2016   Procedure: LEFT UPPER LOBECTOMY;  Surgeon: [name], MD;  Location: [place];  Service: Thoracic;  Laterality: Left;  . LOWER EXTREMITY ANGIOGRAM N/A 02/25/2013   Procedure: LOWER EXTREMITY ANGIOGRAM;  Surgeon: Lorretta Harp, MD;  Location: [facility] CATH LAB;  Service: Cardiovascular;  Laterality: N/A;  . LOWER EXTREMITY ANGIOGRAM N/A 07/27/2013   Procedure: LOWER EXTREMITY ANGIOGRAM;  Surgeon: Lorretta Harp, MD;  Location: [facility] CATH LAB;  Service: Cardiovascular;  Laterality: N/A;  . PATCH ANGIOPLASTY Right 03/06/2013   Procedure: PATCH ANGIOPLASTY of Right Carotid Artery using Vascu-Guard Patch;  Surgeon: Serafina Mitchell, MD;  Location: [place];  Service: Vascular;  Laterality: Right;  . PERIPHERAL VASCULAR CATHETERIZATION N/A 11/15/2015   Procedure: Aortic Arch Angiography;  Surgeon: Serafina Mitchell, MD;  Location: [place] CV LAB;  Service: Cardiovascular;  Laterality: N/A;  . PERIPHERAL  VASCULAR CATHETERIZATION Bilateral 11/15/2015   Procedure: Carotid Angiography;  Surgeon: Serafina Mitchell, MD;  Location: [place] CV LAB;  Service: Cardiovascular;  Laterality: Bilateral;  . PERIPHERAL VASCULAR CATHETERIZATION Left 11/15/2015   Procedure: Upper Extremity Angiography;  Surgeon: Serafina Mitchell, MD;  Location: [place] CV LAB;  Service: Cardiovascular;  Laterality: Left;  . PERIPHERAL VASCULAR CATHETERIZATION Left 11/15/2015   Procedure: Peripheral Vascular Intervention;  Surgeon: Serafina Mitchell, MD;  Location: [place] CV LAB;  Service: Cardiovascular;  Laterality: Left;  subclavian   . RIGHT/LEFT HEART CATH AND CORONARY ANGIOGRAPHY N/A 11/20/2018   Procedure: RIGHT/LEFT HEART CATH AND CORONARY ANGIOGRAPHY;  Surgeon: Nelva Bush, MD;  Location: [place] CV LAB;  Service: Cardiovascular;  Laterality: N/A;  . SALIVARY GLAND SURGERY     scar tissue removed from left saliva glad  . TUBAL LIGATION    . VIDEO ASSISTED THORACOSCOPY (VATS)/WEDGE RESECTION Left 02/03/2016   Procedure: VIDEO ASSISTED THORACOSCOPY;  Surgeon: [name], MD;  Location: [place];  Service: Thoracic;  Laterality: Left;    Allergies: Patient has no known allergies.  Medications: Prior to Admission medications   Medication Sig Start Date End Date Taking? Authorizing Provider  albuterol (PROVENTIL HFA;VENTOLIN HFA) 108 (90 Base) MCG/ACT inhaler Inhale 2 puffs into the lungs every  6 (six) hours as needed for wheezing or shortness of breath.  07/11/17  Yes [provider]  aspirin 81 MG chewable tablet Chew by mouth daily.   Yes [provider]  BIOTIN 5000 PO Take 5,000 mg by mouth daily before supper.    Yes [provider]  budesonide (PULMICORT) 0.25 MG/2ML nebulizer solution Take 2 mLs (0.25 mg total) by nebulization 2 (two) times daily. 11/24/18  Yes Swayze, Ava, DO  calcium carbonate (CALCIUM 600) 600 MG TABS tablet Take 600 mg by mouth daily before supper.     Yes [provider]  clopidogrel (PLAVIX) 75 MG tablet Take 1 tablet (75 mg total) by mouth daily. 06/20/18  Yes Thurnell Lose, MD  gabapentin (NEURONTIN) 100 MG capsule Take 1 capsule (100 mg total) by mouth 3 (three) times daily. Patient taking differently: Take 100 mg by mouth. 100 mg in the morning and 200 at lunch and bedtime 11/24/18  Yes Swayze, Ava, DO  HYDROcodone-acetaminophen (NORCO/VICODIN) 5-325 MG tablet Take 1 tablet by mouth every 12 (twelve) hours as needed for moderate pain. 06/20/18  Yes Thurnell Lose, MD  lisinopril (ZESTRIL) 2.5 MG tablet Take 1 tablet (2.5 mg total) by mouth daily. 11/24/18  Yes Swayze, Ava, DO  MAGNESIUM PO Take 1 tablet by mouth daily before supper.   Yes [provider]  meclizine (ANTIVERT) 12.5 MG tablet Take 12.5 mg by mouth 3 (three) times daily as needed for dizziness or nausea.  09/30/18  Yes [provider]  Menthol, Topical Analgesic, (ICY HOT EX) Apply 1 spray topically every 8 (eight) hours as needed (pain).   Yes [provider]  metFORMIN (GLUCOPHAGE) 500 MG tablet Take by mouth 2 (two) times daily with a meal.   Yes [provider]  metoprolol tartrate (LOPRESSOR) 50 MG tablet Take 50 mg by mouth 2 (two) times daily.   Yes [provider]  Omega-3 Fatty Acids (FISH OIL PO) Take 1 capsule by mouth daily.   Yes [provider]  ondansetron (ZOFRAN) 4 MG tablet Take 4 mg by mouth at bedtime as needed for nausea or vomiting.  06/05/18  Yes [provider]  pantoprazole (PROTONIX) 40 MG tablet Take 40 mg by mouth 2 (two) times daily.  12/17/13  Yes [provider]  pravastatin (PRAVACHOL) 40 MG tablet Take 40 mg by mouth daily before supper.    Yes [provider]  torsemide (DEMADEX) 20 MG tablet Take 72m in the AM and 29min the PM. 03/02/19  Yes Bensimhon, DaShaune PascalMD  traZODone (DESYREL) 50 MG tablet Take 50 mg by mouth at bedtime.   Yes [provider]  ALPRAZolam (XDuanne Moron0.5 MG tablet Take 1-2 tablets 45 minutes before MRI, may take a 3rd if needed 03/10/19   SlSuzzanne CloudNP  ONE TOFirsthealth [facility] Memorial HospitalLTRA TEST test strip  09/12/16   [provider]  polyethylene glycol (MIRALAX / GLYCOLAX) 17 g packet Take by mouth. 12/02/18   [provider]     Family History  Problem Relation Age of Onset  . Stroke Mother   . Hypertension Mother   . Kidney failure Father     Social History   Socioeconomic History  . Marital status: Widowed    Spouse name: Not on file  . Number of children: Not on file  . Years of education: Not on file  . Highest education level: Not on file  Occupational History  . Not on file  Tobacco Use  . Smoking status: Former Smoker    Packs/day: 1.50    Years: 56.00    Pack years: 84.00    Types: Cigarettes    Quit date: 07/07/2013    Years since quitting: 5.8  . Smokeless tobacco: Former Systems developer    Quit date: 02/25/2013  Substance and Sexual Activity  . Alcohol use: No  . Drug use: No  . Sexual activity: Never  Other Topics Concern  . Not on file  Social History Narrative  . Not on file   Social Determinants of Health   Financial Resource Strain:   . Difficulty of Paying Living Expenses: Not on file  Food Insecurity:   . Worried About Charity fundraiser in the Last Year: Not on file  . Ran Out of Food in the Last Year: Not on file  Transportation Needs:   . Lack of Transportation (Medical): Not on file  . Lack of Transportation (Non-Medical): Not on file  Physical Activity:   . Days of Exercise per Week: Not on file  . Minutes of Exercise per Session: Not on file  Stress:   . Feeling of Stress : Not on file  Social Connections:   . Frequency of Communication with Friends and Family: Not on file  . Frequency of Social Gatherings with Friends and Family: Not on file  . Attends Religious Services: Not on file  . Active Member of Clubs or Organizations: Not on file  . Attends  Archivist Meetings: Not on file  . Marital Status: Not on file     Review of Systems: A 12 point ROS discussed and pertinent positives are indicated in the HPI above.  All other systems are negative.  Review of Systems  Constitutional: Negative for chills and fever.  Respiratory: Positive for shortness of breath (chronic - uses oxygen at home). Negative for cough.   Cardiovascular: Negative for chest pain.  Gastrointestinal: Positive for nausea and vomiting (sometimes - not recently). Negative for abdominal pain and diarrhea.  Musculoskeletal: Positive for arthralgias.  Neurological: Positive for dizziness, syncope (not recently), weakness, light-headedness and headaches. Negative for tremors, seizures, facial asymmetry, speech difficulty and numbness.    Vital Signs: BP (!) 107/56   Pulse 61   Resp 18   Ht _0  (1.626 m)   Wt 171 lb (77.6 kg)   SpO2 98%   BMI 29.35 kg/m   Physical Exam Vitals reviewed.  Constitutional:      General: She is not in acute distress.    Comments: Somewhat short of breath during conversation  HENT:     Head: Normocephalic.     Mouth/Throat:     Mouth: Mucous membranes are moist.     Pharynx: Oropharynx is clear. No oropharyngeal exudate or posterior oropharyngeal erythema.  Cardiovascular:     Rate and Rhythm: Normal rate and regular rhythm.  Pulmonary:     Effort: Pulmonary effort is normal.     Breath sounds: Wheezing present.  Abdominal:     General: There is no distension.     Palpations: Abdomen is soft.     Tenderness: There is no abdominal tenderness.  Skin:    General: Skin is warm and dry.  Neurological:     Mental Status: She is alert and oriented to person, place, and time.  Psychiatric:        Mood and Affect: Mood normal.        Behavior: Behavior normal.  Thought Content: Thought content normal.        Judgment: Judgment normal.      MD Evaluation Airway: WNL Heart: WNL Abdomen: WNL Chest/  Lungs: WNL ASA  Classification: 2 Mallampati/Airway Score: Two   Imaging: MR ANGIO HEAD WO CONTRAST  Result Date: 04/02/2019 [facility] NEUROIMAGING REPORT STUDY DATE: 04/01/19 PATIENT NAME: Caitlin Oliver DOB: September 21, 1940 MRN: [id number] ORDERING CLINICIAN: Suzzanne Cloud, NP CLINICAL HISTORY: 79 year old female with vertigo and blurred vision. EXAM: MR ANGIO HEAD WO CONTRAST TECHNIQUE: MR angiogram of the head was obtained utilizing 3D time of flight sequences from below the vertebrobasilar junction up to the intracranial vasculature without contrast.  Computerized reconstructions were obtained. CONTRAST: none COMPARISON: none IMAGING SITE: [facility] [address] (1.5 Tesla MRI)  FINDINGS: This study is of adequate technical quality. Right internal carotid artery is not visualized proximally up to the carotid terminus and may represent proximal stenosis or occlusion. The distal right internal carotid artery terminus appears reconstituted via posterior communicating artery. Flow signal of the left internal carotid arteries have no stenosis. The bilateral middle and anterior cerebral arteries have no stenosis. The bilateral vertebral, basilar, bilateral posterior cerebral arteries have no stenosis. Bilateral posterior communicating arteries are present. The left vertebral artery is dominant and right vertebral artery is hypoplastic. No aneurysmal dilatations are seen.   MRA head (without) demonstrating: - Right internal carotid artery is not visualized and may represent proximal stenosis or occlusion. The distal right internal carotid artery terminus appears reconstituted via posterior communicating artery. INTERPRETING PHYSICIAN: Penni Bombard, MD Certified in Neurology, Neurophysiology and Neuroimaging [facility] [address] [place], [place] [address] [phone]   MR ANGIO NECK W WO CONTRAST  Addendum Date: 04/06/2019     ADDENDUM: - The right internal carotid artery is narrow in caliber from its origin up to the upper cervical region, where there is notable severe focal stenosis (> 70%)  just at the skull base; the right internal carotid artery continues superiorly intracranially, although with narrower caliber than the left internal carotid artery. Penni Bombard, MD 04/06/2019, 2:37 PM   Result Date: 04/03/2019 [facility] NEUROIMAGING REPORT STUDY DATE: 04/01/19 PATIENT NAME: Caitlin Oliver DOB: [birth year] MRN: [id number] ORDERING CLINICIAN: Suzzanne Cloud, NP CLINICAL HISTORY: 79 year old female with vertigo and blurred vision. EXAM: MR ANGIO NECK W WO CONTRAST TECHNIQUE: MR angiogram of the neck was obtained utilizing 3D time-of-flight sequences from the aortic arch up to the intracranial vasculature postbolus contrast infusion.  2D time-of-flight noncontrast views and computerized reconstructions were obtained. CONTRAST: 39m multihance COMPARISON: 06/17/18 CTA neck [facility] [address]1.5 Tesla MRI)  FINDINGS: This study is of adequate technical quality.  There is anterograde flow in the left vertebral and bilateral carotid arteries on 2D-TOF views. The right vertebral artery has no flow signal on 2D-TOF views. The flow signal of the left subclavian artery has severe stenosis with thread of flow in the region of known stent. The left common, internal and external carotid arteries have no stenosis.  The left vertebral artery has no stenosis from its origin up to the vertebrobasilar junction.  On the right, the brachiocephalic trunk has moderate-severe decreased flow signal off the aortic arch. The right common carotid origin also has decreased flow signal, but the remainder of the common carotid artery has no stenosis. The right  internal and external carotid arteries have no stenosis. The  right subclavian artery proximal to the right vertebral artery origing has  moderate-severe stenosis. The right vertebral artery has no stenosis from its origin to the vertebrobasilar junction, but may represent retrograde flow via the contralateral vertebral artery. Limited views of the intracranial vasculature are unremarkable.   MRA neck (with and without) demonstrating: - The right vertebral artery has no flow signal on 2D-TOF views. However the right vertebral artery is visualized on post-contrast 3D-TOF views, and may represent retrograde filling via contralateral vertebral artery and due to right subclavian steal phenomenon; the right subclavian artery has moderate-severe stenosis proximal to the right vertebral artery origin. - The right brachiocephalic trunk origin has moderate-severe decreased flow signal off the aortic arch. - The right common carotid origin also has severe decreased flow signal at its origin, but the remainder of the common carotid artery has no stenosis. - The left subclavian artery has severe stenosis with thread of flow in the region of known stent. - Findings appear to have progressed since CTA on 06/17/18, although these differences may be due to CT vs MR capabilities. Consider clinical correlation and conventional cerebral angiography. INTERPRETING PHYSICIAN: Penni Bombard, MD Certified in Neurology, Neurophysiology and Neuroimaging [facility] [address], [place], [place] [address] [phone]   VAS [place] ABI WITH/WO TBI  Result Date: 04/03/2019 LOWER EXTREMITY DOPPLER STUDY Indications: Claudication, and peripheral artery disease. High Risk Factors: Hypertension, hyperlipidemia, Diabetes, past history of                    smoking, prior CVA.  Vascular Interventions: Atherectomy, PTA, and stenting of bilateral common iliac                         arteries in 07/2013. Comparison Study: 07/21/2018 Performing Technologist: Caralee Ates BA, RVT, RDMS  Examination Guidelines: A complete evaluation includes at minimum,  Doppler waveform signals and systolic blood pressure reading at the level of bilateral brachial, anterior tibial, and posterior tibial arteries, when vessel segments are accessible. Bilateral testing is considered an integral part of a complete examination. Photoelectric Plethysmograph (PPG) waveforms and toe systolic pressure readings are included as required and additional duplex testing as needed. Limited examinations for reoccurring indications may be performed as noted.  ABI Findings: +---------+------------------+-----+---------+--------+ Right    Rt Pressure (mmHg)IndexWaveform Comment  +---------+------------------+-----+---------+--------+ Brachial 46                     triphasic         +---------+------------------+-----+---------+--------+ PTA      103               1.12 triphasic         +---------+------------------+-----+---------+--------+ DP       77                0.84 triphasic         +---------+------------------+-----+---------+--------+ Great Toe57                0.62 Abnormal          +---------+------------------+-----+---------+--------+ +---------+------------------+-----+---------+-------+ Left     Lt Pressure (mmHg)IndexWaveform Comment +---------+------------------+-----+---------+-------+ Brachial 92                     triphasic        +---------+------------------+-----+---------+-------+ PTA      107               1.16 triphasic        +---------+------------------+-----+---------+-------+  DP       93                1.01 triphasic        +---------+------------------+-----+---------+-------+ Great Toe57                0.62 Abnormal         +---------+------------------+-----+---------+-------+ +-------+-----------+-----------+------------+------------+ ABI/TBIToday's ABIToday's TBIPrevious ABIPrevious TBI +-------+-----------+-----------+------------+------------+ Right  1.12       0.62       1.29        0.85          +-------+-----------+-----------+------------+------------+ Left   1.16       0.62       1.14        0.93         +-------+-----------+-----------+------------+------------+ Bilateral ABIs appear essentially unchanged compared to prior study on 07/21/2018. Bilateral TBIs appear decreased compared to prior study on 07/21/2018.  Summary: Right: Resting right ankle-brachial index is within normal range. No evidence of significant right lower extremity arterial disease. The right toe-brachial index is abnormal. RT great toe pressure = 57 mmHg. Left: Resting left ankle-brachial index is within normal range. No evidence of significant left lower extremity arterial disease. The left toe-brachial index is abnormal. LT Great toe pressure = 57 mmHg.  *See table(s) above for measurements and observations.  Electronically signed by Servando Snare MD on 04/03/2019 at 4:25:03 PM.    Final    VAS [place] LOWER EXTREMITY ARTERIAL DUPLEX  Result Date: 04/03/2019 LOWER EXTREMITY ARTERIAL DUPLEX STUDY Indications: Claudication, and peripheral artery disease. High Risk Factors: Hypertension, hyperlipidemia, Diabetes, past history of                    smoking, prior CVA.  Vascular Interventions: Atherectomy, PTA, and stenting of bilateral common iliac                         arteries in 07/2013. Current ABI:            RT 1.12 LT 1.16 Performing Technologist: Caralee Ates BA, RVT, RDMS  Examination Guidelines: A complete evaluation includes B-mode imaging, spectral Doppler, color Doppler, and power Doppler as needed of all accessible portions of each vessel. Bilateral testing is considered an integral part of a complete examination. Limited examinations for reoccurring indications may be performed as noted.  +-----------+--------+-----+--------+---------+--------+ RIGHT      PSV cm/sRatioStenosisWaveform Comments +-----------+--------+-----+--------+---------+--------+ CFA Prox   103                  biphasic           +-----------+--------+-----+--------+---------+--------+ CFA Mid    139                  biphasic          +-----------+--------+-----+--------+---------+--------+ CFA Distal 142                  biphasic          +-----------+--------+-----+--------+---------+--------+ DFA        84                   biphasic          +-----------+--------+-----+--------+---------+--------+ SFA Prox   108                  triphasic         +-----------+--------+-----+--------+---------+--------+ SFA Mid    97  triphasic         +-----------+--------+-----+--------+---------+--------+ SFA Distal 103                  triphasic         +-----------+--------+-----+--------+---------+--------+ POP Prox   61                   triphasic         +-----------+--------+-----+--------+---------+--------+ POP Mid    77                   triphasic         +-----------+--------+-----+--------+---------+--------+ POP Distal 82                   triphasic         +-----------+--------+-----+--------+---------+--------+ ATA Distal 84                   triphasic         +-----------+--------+-----+--------+---------+--------+ PTA Prox   90                   triphasic         +-----------+--------+-----+--------+---------+--------+ PTA Mid    90                   triphasic         +-----------+--------+-----+--------+---------+--------+ PTA Distal 95                   triphasic         +-----------+--------+-----+--------+---------+--------+ PERO Prox  49                   biphasic          +-----------+--------+-----+--------+---------+--------+ PERO Mid   36                   biphasic          +-----------+--------+-----+--------+---------+--------+ PERO Distal39                   biphasic          +-----------+--------+-----+--------+---------+--------+  +-----------+--------+-----+--------+---------+--------+  LEFT       PSV cm/sRatioStenosisWaveform Comments +-----------+--------+-----+--------+---------+--------+ CFA Prox   96                   biphasic          +-----------+--------+-----+--------+---------+--------+ CFA Mid    144                  biphasic          +-----------+--------+-----+--------+---------+--------+ CFA Distal 146                  biphasic          +-----------+--------+-----+--------+---------+--------+ DFA        96                   biphasic          +-----------+--------+-----+--------+---------+--------+ SFA Prox   121                  biphasic          +-----------+--------+-----+--------+---------+--------+ SFA Mid    125                  biphasic          +-----------+--------+-----+--------+---------+--------+ SFA Distal 88  biphasic          +-----------+--------+-----+--------+---------+--------+ POP Prox   93                   biphasic          +-----------+--------+-----+--------+---------+--------+ POP Mid    72                   biphasic          +-----------+--------+-----+--------+---------+--------+ POP Distal 55                   biphasic          +-----------+--------+-----+--------+---------+--------+ ATA Distal 72                   triphasic         +-----------+--------+-----+--------+---------+--------+ PTA Prox   84                   biphasic          +-----------+--------+-----+--------+---------+--------+ PTA Mid    81                   biphasic          +-----------+--------+-----+--------+---------+--------+ PTA Distal 97                   biphasic          +-----------+--------+-----+--------+---------+--------+ PERO Prox  36                   biphasic          +-----------+--------+-----+--------+---------+--------+ PERO Mid   36                   biphasic          +-----------+--------+-----+--------+---------+--------+ PERO  Distal32                   biphasic          +-----------+--------+-----+--------+---------+--------+  Summary: Right: No evidence of arterial stenosis in the right lower extremity. Left: No evidence of arterial stenosis in the left lower extremity.  See table(s) above for measurements and observations. Electronically signed by Servando Snare MD on 04/03/2019 at 4:25:27 PM.    Final     Labs:  CBC: Recent Labs    11/19/18 0222 11/20/18 1525 11/20/18 1527 11/21/18 0525 01/27/19 1904 04/30/19 0643  WBC 6.4  --   --  6.3 8.9 8.1  HGB 11.2*   < > 12.6 10.9* 11.6* 11.9*  HCT 39.2   < > 37.0 37.7 37.0 37.9  PLT 223  --   --  224 304 333   < > = values in this interval not displayed.    COAGS: Recent Labs    06/16/18 1743 04/30/19 0643  INR 1.1 1.0  APTT 28  --     BMP: Recent Labs    01/09/19 1234 01/13/19 1125 01/27/19 1904 04/30/19 0643  NA 137 135 131* 139  K 4.9 4.4 3.6 4.1  CL 94* 92* 90* 96*  CO2 34* _0 GLUCOSE 182* 214* 193* 188*  BUN 21 28* 28* 22  CALCIUM 9.6 9.3 9.4 10.0  CREATININE 0.78 1.09* 1.20* 0.98  GFRNONAA >60 49* 43* 55*  GFRAA >60 56* 50* >60    LIVER FUNCTION TESTS: Recent Labs    11/17/18 0449 11/18/18 0431 11/19/18 0222 01/27/19 1904  BILITOT 0.4 0.7 0.5 0.7  AST 34 34  44* 23  ALT _0 ALKPHOS 44 50 47 59  PROT 6.5 7.0 6.3* 7.0  ALBUMIN 3.6 3.9 3.5 4.0    TUMOR MARKERS: No results for input(s): AFPTM, CEA, CA199, CHROMGRNA in the last 8760 hours.  Assessment and Plan:  79 y/o F with extensive medical history as per HPI who presents today for an image guided diagnostic cerebral angiogram to further evaluate recent MRA head/neck findings in setting of persistent dizziness and nausea.  Patient has been NPO since midnight, she currently takes Plavix 75 mg + ASA 81 mg QD and has been compliant with her medications. Afebrile, WBC 8.1, hgb 11.9, plt 333, creatinine 0.98, INR 1.0.  Risks and benefits of diagnostic  angiogram were discussed with the patient including, but not limited to bleeding, infection, vascular injury, stroke, or contrast induced renal failure.  This interventional procedure involves the use of X-rays and because of the nature of the planned procedure, it is possible that we will have prolonged use of X-ray fluoroscopy.  Potential radiation risks to you include (but are not limited to) the following: - A slightly elevated risk for cancer  several years later in life. This risk is typically less than 0.5% percent. This risk is low in comparison to the normal incidence of human cancer, which is 33% for women and 50% for men according to the [place]. - Radiation induced injury can include skin redness, resembling a rash, tissue breakdown / ulcers and hair loss (which can be temporary or permanent).  The likelihood of either of these occurring depends on the difficulty of the procedure and whether you are sensitive to radiation due to previous procedures, disease, or genetic conditions.  IF your procedure requires a prolonged use of radiation, you will be notified and given written instructions for further action.  It is your responsibility to monitor the irradiated area for the 2 weeks following the procedure and to notify your physician if you are concerned that you have suffered a radiation induced injury.    All of the patient's questions were answered, patient is agreeable to proceed.  Consent signed and in chart.  Thank you for this interesting consult.  I greatly enjoyed meeting Caitlin Oliver and look forward to participating in their care.  A copy of this report was sent to the requesting provider on this date.  Electronically Signed: Joaquim Nam, PA-C 04/30/2019, 7:58 AM   I spent a total of 40 Minutes  in face to face in clinical consultation, greater than 50% of which was counseling/coordinating care for diagnostic cerebral angiogram.

## 2019-04-30 NOTE — Progress Notes (Signed)
Procedure cancelled per Dr. Estanislado Pandy. Pt D/C home in wheelchair with niece. Awake and alert. In no distress.

## 2019-05-11 DIAGNOSIS — I5043 Acute on chronic combined systolic (congestive) and diastolic (congestive) heart failure: Secondary | ICD-10-CM | POA: Diagnosis not present

## 2019-05-11 DIAGNOSIS — R0902 Hypoxemia: Secondary | ICD-10-CM | POA: Diagnosis not present

## 2019-05-11 DIAGNOSIS — J449 Chronic obstructive pulmonary disease, unspecified: Secondary | ICD-10-CM | POA: Diagnosis not present

## 2019-05-11 DIAGNOSIS — C3492 Malignant neoplasm of unspecified part of left bronchus or lung: Secondary | ICD-10-CM | POA: Diagnosis not present

## 2019-05-13 ENCOUNTER — Other Ambulatory Visit: Payer: Self-pay | Admitting: Radiology

## 2019-05-14 ENCOUNTER — Ambulatory Visit (HOSPITAL_COMMUNITY)
Admission: RE | Admit: 2019-05-14 | Discharge: 2019-05-14 | Disposition: A | Discharge status: Home / Self Care | Payer: PPO | Source: Ambulatory Visit | Attending: Interventional Radiology | Admitting: Interventional Radiology

## 2019-05-14 ENCOUNTER — Other Ambulatory Visit (HOSPITAL_COMMUNITY): Payer: Self-pay | Admitting: Interventional Radiology

## 2019-05-14 ENCOUNTER — Other Ambulatory Visit: Payer: Self-pay

## 2019-05-14 ENCOUNTER — Telehealth: Payer: Self-pay | Admitting: Radiology

## 2019-05-14 ENCOUNTER — Encounter (HOSPITAL_COMMUNITY): Payer: Self-pay

## 2019-05-14 DIAGNOSIS — K219 Gastro-esophageal reflux disease without esophagitis: Secondary | ICD-10-CM | POA: Insufficient documentation

## 2019-05-14 DIAGNOSIS — Z87891 Personal history of nicotine dependence: Secondary | ICD-10-CM | POA: Diagnosis not present

## 2019-05-14 DIAGNOSIS — E7800 Pure hypercholesterolemia, unspecified: Secondary | ICD-10-CM | POA: Insufficient documentation

## 2019-05-14 DIAGNOSIS — Z7984 Long term (current) use of oral hypoglycemic drugs: Secondary | ICD-10-CM | POA: Insufficient documentation

## 2019-05-14 DIAGNOSIS — R42 Dizziness and giddiness: Secondary | ICD-10-CM

## 2019-05-14 DIAGNOSIS — I739 Peripheral vascular disease, unspecified: Secondary | ICD-10-CM | POA: Insufficient documentation

## 2019-05-14 DIAGNOSIS — I10 Essential (primary) hypertension: Secondary | ICD-10-CM | POA: Diagnosis not present

## 2019-05-14 DIAGNOSIS — E119 Type 2 diabetes mellitus without complications: Secondary | ICD-10-CM | POA: Diagnosis not present

## 2019-05-14 DIAGNOSIS — H538 Other visual disturbances: Secondary | ICD-10-CM | POA: Insufficient documentation

## 2019-05-14 DIAGNOSIS — J449 Chronic obstructive pulmonary disease, unspecified: Secondary | ICD-10-CM | POA: Diagnosis not present

## 2019-05-14 DIAGNOSIS — E785 Hyperlipidemia, unspecified: Secondary | ICD-10-CM | POA: Diagnosis not present

## 2019-05-14 DIAGNOSIS — Z7982 Long term (current) use of aspirin: Secondary | ICD-10-CM | POA: Insufficient documentation

## 2019-05-14 DIAGNOSIS — Z7902 Long term (current) use of antithrombotics/antiplatelets: Secondary | ICD-10-CM | POA: Diagnosis not present

## 2019-05-14 DIAGNOSIS — G450 Vertebro-basilar artery syndrome: Secondary | ICD-10-CM | POA: Insufficient documentation

## 2019-05-14 DIAGNOSIS — I771 Stricture of artery: Secondary | ICD-10-CM | POA: Diagnosis not present

## 2019-05-14 DIAGNOSIS — Z79899 Other long term (current) drug therapy: Secondary | ICD-10-CM | POA: Diagnosis not present

## 2019-05-14 HISTORY — PX: IR ANGIO INTRA EXTRACRAN SEL COM CAROTID INNOMINATE UNI R MOD SED: IMG5359

## 2019-05-14 HISTORY — PX: IR ANGIO VERTEBRAL SEL SUBCLAVIAN INNOMINATE BILAT MOD SED: IMG5366

## 2019-05-14 LAB — PROTIME-INR
INR: 0.9 (ref 0.8–1.2)
Prothrombin Time: 12.5 seconds (ref 11.4–15.2)

## 2019-05-14 LAB — BASIC METABOLIC PANEL
Anion gap: 15 (ref 5–15)
BUN: 24 mg/dL — ABNORMAL HIGH (ref 8–23)
CO2: 28 mmol/L (ref 22–32)
Calcium: 9.6 mg/dL (ref 8.9–10.3)
Chloride: 94 mmol/L — ABNORMAL LOW (ref 98–111)
Creatinine, Ser: 1.08 mg/dL — ABNORMAL HIGH (ref 0.44–1.00)
GFR calc Af Amer: 57 mL/min — ABNORMAL LOW (ref >60)
GFR calc non Af Amer: 49 mL/min — ABNORMAL LOW (ref >60)
Glucose, Bld: 203 mg/dL — ABNORMAL HIGH (ref 70–99)
Potassium: 4.1 mmol/L (ref 3.5–5.1)
Sodium: 137 mmol/L (ref 135–145)

## 2019-05-14 LAB — CBC
HCT: 37.1 % (ref 36.0–46.0)
Hemoglobin: 11.5 g/dL — ABNORMAL LOW (ref 12.0–15.0)
MCH: 26.7 pg (ref 26.0–34.0)
MCHC: 31 g/dL (ref 30.0–36.0)
MCV: 86.3 fL (ref 80.0–100.0)
Platelets: 346 10*3/uL (ref 150–400)
RBC: 4.3 MIL/uL (ref 3.87–5.11)
RDW: 13.3 % (ref 11.5–15.5)
WBC: 7.7 10*3/uL (ref 4.0–10.5)
nRBC: 0 % (ref 0.0–0.2)

## 2019-05-14 MED ORDER — ALBUTEROL SULFATE (2.5 MG/3ML) 0.083% IN NEBU
INHALATION_SOLUTION | RESPIRATORY_TRACT | Status: AC
  Filled 2019-05-14: qty 3

## 2019-05-14 MED ORDER — ACETAMINOPHEN 325 MG PO TABS: 650.0000 mg | ORAL_TABLET | Freq: Once | ORAL | Status: AC

## 2019-05-14 MED ORDER — IOHEXOL 300 MG/ML  SOLN
150.0000 mL | Freq: Once | INTRAMUSCULAR | Status: AC | PRN
  Administered 2019-05-14: 30 mL via INTRA_ARTERIAL

## 2019-05-14 MED ORDER — LIDOCAINE HCL 1 % IJ SOLN
INTRAMUSCULAR | Status: AC
  Filled 2019-05-14: qty 20

## 2019-05-14 MED ORDER — SODIUM CHLORIDE 0.9 % IV SOLN: INTRAVENOUS | Status: AC

## 2019-05-14 MED ORDER — NITROGLYCERIN 1 MG/10 ML FOR IR/CATH LAB
INTRA_ARTERIAL | Status: AC
  Filled 2019-05-14: qty 10

## 2019-05-14 MED ORDER — SODIUM CHLORIDE 0.9 % IV SOLN
INTRAVENOUS | Status: AC | PRN
  Administered 2019-05-14: 10 mL/h via INTRAVENOUS

## 2019-05-14 MED ORDER — HEPARIN SODIUM (PORCINE) 1000 UNIT/ML IJ SOLN
INTRAMUSCULAR | Status: AC | PRN
  Administered 2019-05-14: 1000 [IU] via INTRAVENOUS

## 2019-05-14 MED ORDER — SODIUM CHLORIDE 0.9 % IV SOLN: Freq: Once | INTRAVENOUS | Status: DC

## 2019-05-14 MED ORDER — FENTANYL CITRATE (PF) 100 MCG/2ML IJ SOLN
INTRAMUSCULAR | Status: AC
  Filled 2019-05-14: qty 2

## 2019-05-14 MED ORDER — FENTANYL CITRATE (PF) 100 MCG/2ML IJ SOLN
INTRAMUSCULAR | Status: AC | PRN
  Administered 2019-05-14: 25 ug via INTRAVENOUS

## 2019-05-14 MED ORDER — ACETAMINOPHEN 325 MG PO TABS
ORAL_TABLET | ORAL | Status: AC
  Administered 2019-05-14: 650 mg via ORAL
  Filled 2019-05-14: qty 2

## 2019-05-14 MED ORDER — HEPARIN SODIUM (PORCINE) 1000 UNIT/ML IJ SOLN
INTRAMUSCULAR | Status: AC
  Filled 2019-05-14: qty 2

## 2019-05-14 MED ORDER — MIDAZOLAM HCL 2 MG/2ML IJ SOLN
INTRAMUSCULAR | Status: AC | PRN
  Administered 2019-05-14: 0.5 mg via INTRAVENOUS

## 2019-05-14 MED ORDER — IOHEXOL 300 MG/ML  SOLN
150.0000 mL | Freq: Once | INTRAMUSCULAR | Status: AC | PRN
  Administered 2019-05-14: 72 mL via INTRA_ARTERIAL

## 2019-05-14 MED ORDER — VERAPAMIL HCL 2.5 MG/ML IV SOLN
INTRAVENOUS | Status: AC
  Filled 2019-05-14: qty 2

## 2019-05-14 MED ORDER — ALBUTEROL SULFATE (2.5 MG/3ML) 0.083% IN NEBU
2.5000 mg | INHALATION_SOLUTION | Freq: Once | RESPIRATORY_TRACT | Status: AC
  Administered 2019-05-14: 2.5 mg via RESPIRATORY_TRACT

## 2019-05-14 MED ORDER — MIDAZOLAM HCL 2 MG/2ML IJ SOLN
INTRAMUSCULAR | Status: AC
  Filled 2019-05-14: qty 2

## 2019-05-14 MED ORDER — HEPARIN SODIUM (PORCINE) 1000 UNIT/ML IJ SOLN
INTRAMUSCULAR | Status: AC
  Filled 2019-05-14: qty 1

## 2019-05-14 MED ORDER — LIDOCAINE HCL 1 % IJ SOLN
INTRAMUSCULAR | Status: AC | PRN
  Administered 2019-05-14: 12 mL

## 2019-05-14 NOTE — Procedures (Signed)
S/P innominate artery angiogram,Lt [facility] angiogram and Arch aorto gram  RT CFA approach. Findings. 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 3. Severe stenosis at origin of Lt CCA  4.Mild to mod stenosis of Lt SCA stent. 5.Patent both ICCAS to the cranial skull base. 6.Patent ACAS and MCAs bilaterally S.Leen Tworek MD

## 2019-05-14 NOTE — Discharge Instructions (Signed)
Angiogram, Care After This sheet gives you information about how to care for yourself after your procedure. Your doctor may also give you more specific instructions. If you have problems or questions, contact your doctor. Follow these instructions at home: Insertion site care  Follow instructions from your doctor about how to take care of your long, thin tube (catheter) insertion area. Make sure you: ? Wash your hands with soap and water before you change your bandage (dressing). If you cannot use soap and water, use hand sanitizer. ? Change your bandage as told by your doctor. ? Leave stitches (sutures), skin glue, or skin tape (adhesive) strips in place. They may need to stay in place for 2 weeks or longer. If tape strips get loose and curl up, you may trim the loose edges. Do not remove tape strips completely unless your doctor says it is okay.  Do not take baths, swim, or use a hot tub until your doctor says it is okay.  You may shower 24-48 hours after the procedure or as told by your doctor. ? Gently wash the area with plain soap and water. ? Pat the area dry with a clean towel. ? Do not rub the area. This may cause bleeding.  Do not apply powder or lotion to the area. Keep the area clean and dry.  Check your insertion area every day for signs of infection. Check for: ? More redness, swelling, or pain. ? Fluid or blood. ? Warmth. ? Pus or a bad smell. Activity  Rest as told by your doctor, usually for 1-2 days.  Do not lift anything that is heavier than 10 lbs. (4.5 kg) or as told by your doctor.  Do not drive for 24 hours if you were given a medicine to help you relax (sedative).  Do not drive or use heavy machinery while taking prescription pain medicine. General instructions   Go back to your normal activities as told by your doctor, usually in about a week. Ask your doctor what activities are safe for you.  If the insertion area starts to bleed, lie flat and put  pressure on the area. If the bleeding does not stop, get help right away. This is an emergency.  Drink enough fluid to keep your pee (urine) clear or pale yellow.  Take over-the-counter and prescription medicines only as told by your doctor.  Keep all follow-up visits as told by your doctor. This is important. Contact a doctor if:  You have a fever.  You have chills.  You have more redness, swelling, or pain around your insertion area.  You have fluid or blood coming from your insertion area.  The insertion area feels warm to the touch.  You have pus or a bad smell coming from your insertion area.  You have more bruising around the insertion area.  Blood collects in the tissue around the insertion area (hematoma) that may be painful to the touch. Get help right away if:  You have a lot of pain in the insertion area.  The insertion area swells very fast.  The insertion area is bleeding, and the bleeding does not stop after holding steady pressure on the area.  The area near or just beyond the insertion area becomes pale, cool, tingly, or numb. These symptoms may be an emergency. Do not wait to see if the symptoms will go away. Get medical help right away. Call your local emergency services (911 in the [place] Do not drive yourself to the hospital.  Summary  After the procedure, it is common to have bruising and tenderness at the long, thin tube insertion area.  After the procedure, it is important to rest and drink plenty of fluids.  Do not take baths, swim, or use a hot tub until your doctor says it is okay to do so. You may shower 24-48 hours after the procedure or as told by your doctor.  If the insertion area starts to bleed, lie flat and put pressure on the area. If the bleeding does not stop, get help right away. This is an emergency. This information is not intended to replace advice given to you by your health care provider. Make sure you discuss any questions you have  with your health care provider. Document Revised: 03/15/2017 Document Reviewed: 03/27/2016 Elsevier Patient Education  [place]. Moderate Conscious Sedation, Adult, Care After These instructions provide you with information about caring for yourself after your procedure. Your health care provider may also give you more specific instructions. Your treatment has been planned according to current medical practices, but problems sometimes occur. Call your health care provider if you have any problems or questions after your procedure. What can I expect after the procedure? After your procedure, it is common:  To feel sleepy for several hours.  To feel clumsy and have poor balance for several hours.  To have poor judgment for several hours.  To vomit if you eat too soon. Follow these instructions at home: For at least 24 hours after the procedure:   Do not: ? Participate in activities where you could fall or become injured. ? Drive. ? Use heavy machinery. ? Drink alcohol. ? Take sleeping pills or medicines that cause drowsiness. ? Make important decisions or sign legal documents. ? Take care of children on your own.  Rest. Eating and drinking  Follow the diet recommended by your health care provider.  If you vomit: ? Drink water, juice, or soup when you can drink without vomiting. ? Make sure you have little or no nausea before eating solid foods. General instructions  Have a responsible adult stay with you until you are awake and alert.  Take over-the-counter and prescription medicines only as told by your health care provider.  If you smoke, do not smoke without supervision.  Keep all follow-up visits as told by your health care provider. This is important. Contact a health care provider if:  You keep feeling nauseous or you keep vomiting.  You feel light-headed.  You develop a rash.  You have a fever. Get help right away if:  You have trouble  breathing. This information is not intended to replace advice given to you by your health care provider. Make sure you discuss any questions you have with your health care provider. Document Revised: 03/15/2017 Document Reviewed: 07/23/2015 Elsevier Patient Education  2020 [employer].

## 2019-05-14 NOTE — H&P (Signed)
Chief Complaint: Patient was seen in consultation today for cerebral arteriogram at the request of Dr Royal Hawthorn   Supervising Physician: Luanne Bras  Patient Status: [facility] - Out-pt  History of Present Illness: AEMILIA Oliver is a 79 y.o. female   Pt was scheduled for cerebral arteriogram 04/30/19 but was cancelled secondary emergency procedures with Dr Estanislado Pandy Now returns for procedure  Hx GERD; COPD; HTN; HLD; PAD/stent L iliac artery [place] carotid endarterectomy 2014 Subclavian steal syndrome- stenting L [place] artery 2017 Known cerebral artery stenosis and on Plavix/ASA  Dizziness and nausea ongoing for months Syncopal episode  Occasional blurred vision  04/01/19 imaging: MRA neck (with and without) demonstrating: - The right vertebral artery has no flow signal on 2D-TOF views. However the right vertebral artery is visualized on post-contrast 3D-TOF views, and may represent retrograde filling via contralateral vertebral artery and due to right subclavian steal phenomenon; the right subclavian artery has moderate-severe stenosis proximal to the right vertebral artery origin. - The right brachiocephalic trunk origin has moderate-severe decreased flow signal off the aortic arch. - The right common carotid origin also has severe decreased flow signal at its origin, but the remainder of the common carotid artery has no stenosis. - The left subclavian artery has severe stenosis with thread of flow in the region of known stent. - Findings appear to have progressed since CTA on 06/17/18, although these differences may be due to CT vs MR capabilities. Consider clinical correlation and conventional cerebral angiography.   - The right internal carotid artery is narrow in caliber from its origin up to the upper cervical region, where there is notable severe focal stenosis (> 70%)  just at the skull base; the right internal carotid artery continues superiorly intracranially, although with  narrower caliber than the left internal carotid artery.   Scheduled now for arteriogram to fully evaluate stenosis/arterial disease    Past Medical History:  Diagnosis Date  . Anxiety   . Carotid artery disease ([place])   . Claudication ([place])   . COPD (chronic obstructive pulmonary disease) ([place])   . Diabetes mellitus   . Dizzy   . GERD (gastroesophageal reflux disease)   . History of kidney stones   . Hypercholesteremia   . Hypertension   . 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 Stomach ulcer   . Tobacco abuse     Past Surgical History:  Procedure Laterality Date  . ABDOMINAL HYSTERECTOMY    . APPENDECTOMY    . BREAST REDUCTION SURGERY    . CAROTID ANGIOGRAM N/A 02/25/2013   Procedure: CAROTID ANGIOGRAM;  Surgeon: Lorretta Harp, MD;  Location: [facility] CATH LAB;  Service: Cardiovascular;  Laterality: N/A;  . ENDARTERECTOMY Right 03/06/2013   Procedure: ENDARTERECTOMY CAROTID-RIGHT;  Surgeon: Serafina Mitchell, MD;  Location: [place];  Service: Vascular;  Laterality: Right;  . ENDARTERECTOMY Left 05/07/2013   Procedure: LEFT CAROTID ARTERY ENDARTERECTOMY WITH VASCU-GUARD PATCH ANGIOPLASTY ;  Surgeon: Serafina Mitchell, MD;  Location: [place];  Service: Vascular;  Laterality: Left;  . LOBECTOMY Left 02/03/2016   Procedure: LEFT UPPER LOBECTOMY;  Surgeon: [name], MD;  Location: [place];  Service: Thoracic;  Laterality: Left;  . LOWER EXTREMITY ANGIOGRAM N/A 02/25/2013   Procedure: LOWER EXTREMITY ANGIOGRAM;  Surgeon: Lorretta Harp, MD;  Location: [facility] CATH LAB;  Service: Cardiovascular;  Laterality: N/A;  . LOWER EXTREMITY ANGIOGRAM N/A 07/27/2013   Procedure: LOWER EXTREMITY ANGIOGRAM;  Surgeon:  Lorretta Harp, MD;  Location: [facility] CATH LAB;  Service: Cardiovascular;  Laterality: N/A;  . PATCH ANGIOPLASTY Right 03/06/2013   Procedure: PATCH ANGIOPLASTY of Right Carotid Artery using Vascu-Guard Patch;  Surgeon: Serafina Mitchell, MD;   Location: [place];  Service: Vascular;  Laterality: Right;  . PERIPHERAL VASCULAR CATHETERIZATION N/A 11/15/2015   Procedure: Aortic Arch Angiography;  Surgeon: Serafina Mitchell, MD;  Location: [place] CV LAB;  Service: Cardiovascular;  Laterality: N/A;  . PERIPHERAL VASCULAR CATHETERIZATION Bilateral 11/15/2015   Procedure: Carotid Angiography;  Surgeon: Serafina Mitchell, MD;  Location: [place] CV LAB;  Service: Cardiovascular;  Laterality: Bilateral;  . PERIPHERAL VASCULAR CATHETERIZATION Left 11/15/2015   Procedure: Upper Extremity Angiography;  Surgeon: Serafina Mitchell, MD;  Location: [place] CV LAB;  Service: Cardiovascular;  Laterality: Left;  . PERIPHERAL VASCULAR CATHETERIZATION Left 11/15/2015   Procedure: Peripheral Vascular Intervention;  Surgeon: Serafina Mitchell, MD;  Location: [place] CV LAB;  Service: Cardiovascular;  Laterality: Left;  subclavian   . RIGHT/LEFT HEART CATH AND CORONARY ANGIOGRAPHY N/A 11/20/2018   Procedure: RIGHT/LEFT HEART CATH AND CORONARY ANGIOGRAPHY;  Surgeon: Nelva Bush, MD;  Location: [place] CV LAB;  Service: Cardiovascular;  Laterality: N/A;  . SALIVARY GLAND SURGERY     scar tissue removed from left saliva glad  . TUBAL LIGATION    . VIDEO ASSISTED THORACOSCOPY (VATS)/WEDGE RESECTION Left 02/03/2016   Procedure: VIDEO ASSISTED THORACOSCOPY;  Surgeon: [name], MD;  Location: [place];  Service: Thoracic;  Laterality: Left;    Allergies: Patient has no known allergies.  Medications: Prior to Admission medications   Medication Sig Start Date End Date Taking? Authorizing Provider  albuterol (PROVENTIL HFA;VENTOLIN HFA) 108 (90 Base) MCG/ACT inhaler Inhale 2 puffs into the lungs every 6 (six) hours as needed for wheezing or shortness of breath.  07/11/17  Yes [provider]  ALPRAZolam Duanne Moron) 0.5 MG tablet Take 1-2 tablets 45 minutes before MRI, may take a 3rd if needed 03/10/19  Yes Suzzanne Cloud, NP  aspirin 81 MG  chewable tablet Chew by mouth daily.   Yes [provider]  BIOTIN 5000 PO Take 5,000 mg by mouth daily before supper.    Yes [provider]  budesonide (PULMICORT) 0.25 MG/2ML nebulizer solution Take 2 mLs (0.25 mg total) by nebulization 2 (two) times daily. 11/24/18  Yes Swayze, Ava, DO  calcium carbonate (CALCIUM 600) 600 MG TABS tablet Take 600 mg by mouth daily before supper.    Yes [provider]  clopidogrel (PLAVIX) 75 MG tablet Take 1 tablet (75 mg total) by mouth daily. 06/20/18  Yes Thurnell Lose, MD  gabapentin (NEURONTIN) 100 MG capsule Take 1 capsule (100 mg total) by mouth 3 (three) times daily. Patient taking differently: Take 100 mg by mouth. 100 mg in the morning and 200 at lunch and bedtime 11/24/18  Yes Swayze, Ava, DO  HYDROcodone-acetaminophen (NORCO/VICODIN) 5-325 MG tablet Take 1 tablet by mouth every 12 (twelve) hours as needed for moderate pain. 06/20/18  Yes Thurnell Lose, MD  lisinopril (ZESTRIL) 2.5 MG tablet Take 1 tablet (2.5 mg total) by mouth daily. 11/24/18  Yes Swayze, Ava, DO  MAGNESIUM PO Take 1 tablet by mouth daily before supper.   Yes [provider]  meclizine (ANTIVERT) 12.5 MG tablet Take 12.5 mg by mouth 3 (three) times daily as needed for dizziness or nausea.  09/30/18  Yes [provider]  Menthol, Topical Analgesic, (ICY  HOT EX) Apply 1 spray topically every 8 (eight) hours as needed (pain).   Yes [provider]  metFORMIN (GLUCOPHAGE) 500 MG tablet Take by mouth 2 (two) times daily with a meal.   Yes [provider]  metoprolol tartrate (LOPRESSOR) 50 MG tablet Take 50 mg by mouth 2 (two) times daily.   Yes [provider]  Omega-3 Fatty Acids (FISH OIL PO) Take 1 capsule by mouth daily.   Yes [provider]  ondansetron (ZOFRAN) 4 MG tablet Take 4 mg by mouth at bedtime as needed for nausea or vomiting.  06/05/18  Yes [provider]  ONE TOUCH ULTRA TEST test  strip  09/12/16  Yes [provider]  pantoprazole (PROTONIX) 40 MG tablet Take 40 mg by mouth 2 (two) times daily.  12/17/13  Yes [provider]  polyethylene glycol (MIRALAX / GLYCOLAX) 17 g packet Take by mouth. 12/02/18  Yes [provider]  pravastatin (PRAVACHOL) 40 MG tablet Take 40 mg by mouth daily before supper.    Yes [provider]  torsemide (DEMADEX) 20 MG tablet Take 63m in the AM and 240min the PM. 03/02/19  Yes Bensimhon, DaShaune PascalMD  traZODone (DESYREL) 50 MG tablet Take 50 mg by mouth at bedtime.   Yes [provider]     Family History  Problem Relation Age of Onset  . Stroke Mother   . Hypertension Mother   . Kidney failure Father     Social History   Socioeconomic History  . Marital status: Widowed    Spouse name: Not on file  . Number of children: Not on file  . Years of education: Not on file  . Highest education level: Not on file  Occupational History  . Not on file  Tobacco Use  . Smoking status: Former Smoker    Packs/day: 1.50    Years: 56.00    Pack years: 84.00    Types: Cigarettes    Quit date: 07/07/2013    Years since quitting: 5.8  . Smokeless tobacco: Former [employer] developer  Quit date: 02/25/2013  Substance and Sexual Activity  . Alcohol use: No  . Drug use: No  . Sexual activity: Never  Other Topics Concern  . Not on file  Social History Narrative  . Not on file   Social Determinants of Health   Financial Resource Strain:   . Difficulty of Paying Living Expenses: Not on file  Food Insecurity:   . Worried About [employer] fundraisern the Last Year: Not on file  . Ran Out of Food in the Last Year: Not on file  Transportation Needs:   . Lack of Transportation (Medical): Not on file  . Lack of Transportation (Non-Medical): Not on file  Physical Activity:   . Days of Exercise per Week: Not on file  . Minutes of Exercise per Session: Not on file  Stress:   . Feeling of Stress : Not on file    Social Connections:   . Frequency of Communication with Friends and Family: Not on file  . Frequency of Social Gatherings with Friends and Family: Not on file  . Attends Religious Services: Not on file  . Active Member of Clubs or Organizations: Not on file  . Attends ClArchivisteetings: Not on file  . Marital Status: Not on file     Review of Systems: A 12 point ROS discussed and pertinent positives are indicated in the HPI  above.  All other systems are negative.  Review of Systems  Constitutional: Positive for fatigue. Negative for activity change, fever and unexpected weight change.  HENT: Negative for trouble swallowing.   Eyes: Positive for visual disturbance.  Respiratory: Positive for shortness of breath. Negative for cough.   Cardiovascular: Negative for chest pain.  Gastrointestinal: Negative for abdominal pain.  Musculoskeletal: Positive for gait problem.  Neurological: Positive for dizziness, weakness and light-headedness. Negative for tremors, seizures, syncope, facial asymmetry, speech difficulty, numbness and headaches.  Psychiatric/Behavioral: Negative for behavioral problems and confusion.    Vital Signs: BP (!) 115/55   Pulse 62   Temp 97.8 F (36.6 C) (Oral)   Resp (!) 25   Ht _0  (1.626 m)   Wt 173 lb 6.4 oz (78.7 kg)   SpO2 98%   BMI 29.76 kg/m   Physical Exam Vitals reviewed.  HENT:     Head: Atraumatic.     Mouth/Throat:     Mouth: Mucous membranes are moist.  Eyes:     Extraocular Movements: Extraocular movements intact.  Cardiovascular:     Rate and Rhythm: Normal rate and regular rhythm.     Heart sounds: Normal heart sounds.  Pulmonary:     Effort: Pulmonary effort is normal.     Breath sounds: Wheezing present.  Abdominal:     Palpations: Abdomen is soft.     Tenderness: There is no abdominal tenderness.  Musculoskeletal:        General: Normal range of motion.     Cervical back: Normal range of motion.     Right lower  leg: No edema.     Left lower leg: No edema.  Skin:    General: Skin is warm and dry.  Neurological:     Mental Status: She is alert and oriented to person, place, and time.  Psychiatric:        Behavior: Behavior normal.        Thought Content: Thought content normal.        Judgment: Judgment normal.     Imaging: No results found.  Labs:  CBC: Recent Labs    11/19/18 0222 11/20/18 1525 11/20/18 1527 11/21/18 0525 01/27/19 1904 04/30/19 0643  WBC 6.4  --   --  6.3 8.9 8.1  HGB 11.2*   < > 12.6 10.9* 11.6* 11.9*  HCT 39.2   < > 37.0 37.7 37.0 37.9  PLT 223  --   --  224 304 333   < > = values in this interval not displayed.    COAGS: Recent Labs    06/16/18 1743 04/30/19 0643  INR 1.1 1.0  APTT 28  --     BMP: Recent Labs    01/09/19 1234 01/13/19 1125 01/27/19 1904 04/30/19 0643  NA 137 135 131* 139  K 4.9 4.4 3.6 4.1  CL 94* 92* 90* 96*  CO2 34* _1 GLUCOSE 182* 214* 193* 188*  BUN 21 28* 28* 22  CALCIUM 9.6 9.3 9.4 10.0  CREATININE 0.78 1.09* 1.20* 0.98  GFRNONAA >60 49* 43* 55*  GFRAA >60 56* 50* >60    LIVER FUNCTION TESTS: Recent Labs    11/17/18 0449 11/18/18 0431 11/19/18 0222 01/27/19 1904  BILITOT 0.4 0.7 0.5 0.7  AST 34 34 44* 23  ALT _2 ALKPHOS 44 50 47 59  PROT 6.5 7.0 6.3* 7.0  ALBUMIN 3.6 3.9 3.5 4.0    TUMOR MARKERS: No results  for input(s): AFPTM, CEA, CA199, CHROMGRNA in the last 8760 hours.  Assessment and Plan:  Dizziness; occasional blurred vision Nausea comes and goes Symptoms for months Abnormal MR with subclavian and carotid artery stenosis Schedule today for cerebral arteriogram to further evaluate stenosis Risks and benefits of cerebral angiogram with intervention were discussed with the patient including, but not limited to bleeding, infection, vascular injury, contrast induced renal failure, stroke or even death.  This interventional procedure involves the use of X-rays and because of  the nature of the planned procedure, it is possible that we will have prolonged use of X-ray fluoroscopy.  Potential radiation risks to you include (but are not limited to) the following: - A slightly elevated risk for cancer  several years later in life. This risk is typically less than 0.5% percent. This risk is low in comparison to the normal incidence of human cancer, which is 33% for women and 50% for men according to the [place]. - Radiation induced injury can include skin redness, resembling a rash, tissue breakdown / ulcers and hair loss (which can be temporary or permanent).   The likelihood of either of these occurring depends on the difficulty of the procedure and whether you are sensitive to radiation due to previous procedures, disease, or genetic conditions.   IF your procedure requires a prolonged use of radiation, you will be notified and given written instructions for further action.  It is your responsibility to monitor the irradiated area for the 2 weeks following the procedure and to notify your physician if you are concerned that you have suffered a radiation induced injury.    All of the patient's questions were answered, patient is agreeable to proceed.  Consent signed and in chart.  Thank you for this interesting consult.  I greatly enjoyed meeting FINLAY MILLS and look forward to participating in their care.  A copy of this report was sent to the requesting provider on this date.  Electronically Signed: Lavonia Drafts, PA-C 05/14/2019, 7:38 AM   I spent a total of    25 Minutes in face to face in clinical consultation, greater than 50% of which was counseling/coordinating care for cerebral arteriogram

## 2019-05-14 NOTE — Sedation Documentation (Signed)
Right groin sheath removed. Quick clot applied

## 2019-05-14 NOTE — Progress Notes (Signed)
    Spoke to Niece Caitlin Oliver Dr Estanislado Pandy asked me to relay to her that there are some findings on todays arteriogram that may explain her symptoms.  He would like [name] to come in with pt for a consult about potential intervention.  She is agreeable She will hear from scheduler for time and date.

## 2019-05-18 ENCOUNTER — Other Ambulatory Visit (HOSPITAL_COMMUNITY): Payer: Self-pay | Admitting: Interventional Radiology

## 2019-05-18 DIAGNOSIS — R42 Dizziness and giddiness: Secondary | ICD-10-CM

## 2019-05-18 DIAGNOSIS — H538 Other visual disturbances: Secondary | ICD-10-CM

## 2019-05-21 ENCOUNTER — Telehealth: Payer: Self-pay | Admitting: Neurology

## 2019-05-21 ENCOUNTER — Ambulatory Visit (HOSPITAL_COMMUNITY)
Admission: RE | Admit: 2019-05-21 | Discharge: 2019-05-21 | Disposition: A | Discharge status: Home / Self Care | Payer: PPO | Source: Ambulatory Visit | Attending: Interventional Radiology | Admitting: Interventional Radiology

## 2019-05-21 ENCOUNTER — Other Ambulatory Visit: Payer: Self-pay

## 2019-05-21 DIAGNOSIS — R42 Dizziness and giddiness: Secondary | ICD-10-CM

## 2019-05-21 DIAGNOSIS — I739 Peripheral vascular disease, unspecified: Secondary | ICD-10-CM

## 2019-05-21 DIAGNOSIS — H538 Other visual disturbances: Secondary | ICD-10-CM

## 2019-05-21 NOTE — Progress Notes (Signed)
Chief Complaint: Patient was seen in consultation today for follow up after cerebral aniogram   History of Present Illness: Caitlin Oliver is a 79 y.o. female with known vascular history. She underwent diagnostic angiogram for symptoms of dizziness. She and her niece are here to review the findings. Pt is currently [place] O2 dependent but does do almost all of her ADLs aside from cleaning and shopping. She has underlying COPD and pulmonary hypertension as well. Known PAD with prior stent L iliac artery Bilat carotid endarterectomy 2014 Subclavian steal syndrome- stenting L [place] artery 2017 Known cerebral artery stenosis and on Plavix/ASA  Past Medical History:  Diagnosis Date  . Anxiety   . Carotid artery disease ([place])   . Claudication ([place])   . COPD (chronic obstructive pulmonary disease) ([place])   . Diabetes mellitus   . Dizzy   . GERD (gastroesophageal reflux disease)   . History of kidney stones   . Hypercholesteremia   . Hypertension   . 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 Stomach ulcer   . Tobacco abuse     Past Surgical History:  Procedure Laterality Date  . ABDOMINAL HYSTERECTOMY    . APPENDECTOMY    . BREAST REDUCTION SURGERY    . CAROTID ANGIOGRAM N/A 02/25/2013   Procedure: CAROTID ANGIOGRAM;  Surgeon: Lorretta Harp, MD;  Location: [facility] CATH LAB;  Service: Cardiovascular;  Laterality: N/A;  . ENDARTERECTOMY Right 03/06/2013   Procedure: ENDARTERECTOMY CAROTID-RIGHT;  Surgeon: Serafina Mitchell, MD;  Location: [facility];  Service: Vascular;  Laterality: Right;  . ENDARTERECTOMY Left 05/07/2013   Procedure: LEFT CAROTID ARTERY ENDARTERECTOMY WITH VASCU-GUARD PATCH ANGIOPLASTY ;  Surgeon: Serafina Mitchell, MD;  Location: [facility] OR;  Service: Vascular;  Laterality: Left;  . IR ANGIO INTRA EXTRACRAN SEL COM CAROTID INNOMINATE UNI R MOD SED  05/14/2019  . IR ANGIO VERTEBRAL SEL SUBCLAVIAN INNOMINATE BILAT MOD SED  05/14/2019  .  LOBECTOMY Left 02/03/2016   Procedure: LEFT UPPER LOBECTOMY;  Surgeon: [name], MD;  Location: [place];  Service: Thoracic;  Laterality: Left;  . LOWER EXTREMITY ANGIOGRAM N/A 02/25/2013   Procedure: LOWER EXTREMITY ANGIOGRAM;  Surgeon: Lorretta Harp, MD;  Location: [facility] CATH LAB;  Service: Cardiovascular;  Laterality: N/A;  . LOWER EXTREMITY ANGIOGRAM N/A 07/27/2013   Procedure: LOWER EXTREMITY ANGIOGRAM;  Surgeon: Lorretta Harp, MD;  Location: [facility] CATH LAB;  Service: Cardiovascular;  Laterality: N/A;  . PATCH ANGIOPLASTY Right 03/06/2013   Procedure: PATCH ANGIOPLASTY of Right Carotid Artery using Vascu-Guard Patch;  Surgeon: Serafina Mitchell, MD;  Location: [place];  Service: Vascular;  Laterality: Right;  . PERIPHERAL VASCULAR CATHETERIZATION N/A 11/15/2015   Procedure: Aortic Arch Angiography;  Surgeon: Serafina Mitchell, MD;  Location: [place] CV LAB;  Service: Cardiovascular;  Laterality: N/A;  . PERIPHERAL VASCULAR CATHETERIZATION Bilateral 11/15/2015   Procedure: Carotid Angiography;  Surgeon: Serafina Mitchell, MD;  Location: [place] CV LAB;  Service: Cardiovascular;  Laterality: Bilateral;  . PERIPHERAL VASCULAR CATHETERIZATION Left 11/15/2015   Procedure: Upper Extremity Angiography;  Surgeon: Serafina Mitchell, MD;  Location: [place] CV LAB;  Service: Cardiovascular;  Laterality: Left;  . PERIPHERAL VASCULAR CATHETERIZATION Left 11/15/2015   Procedure: Peripheral Vascular Intervention;  Surgeon: Serafina Mitchell, MD;  Location: [place] CV LAB;  Service: Cardiovascular;  Laterality: Left;  subclavian   . RIGHT/LEFT HEART CATH AND CORONARY ANGIOGRAPHY N/A 11/20/2018   Procedure:  RIGHT/LEFT HEART CATH AND CORONARY ANGIOGRAPHY;  Surgeon: Nelva Bush, MD;  Location: [place] CV LAB;  Service: Cardiovascular;  Laterality: N/A;  . SALIVARY GLAND SURGERY     scar tissue removed from left saliva glad  . TUBAL LIGATION    . VIDEO ASSISTED THORACOSCOPY (VATS)/WEDGE RESECTION  Left 02/03/2016   Procedure: VIDEO ASSISTED THORACOSCOPY;  Surgeon: [name], MD;  Location: [place];  Service: Thoracic;  Laterality: Left;    Allergies: Patient has no known allergies.  Medications: Prior to Admission medications   Medication Sig Start Date End Date Taking? Authorizing Provider  albuterol (PROVENTIL HFA;VENTOLIN HFA) 108 (90 Base) MCG/ACT inhaler Inhale 2 puffs into the lungs every 6 (six) hours as needed for wheezing or shortness of breath.  07/11/17   [provider]  ALPRAZolam Duanne Moron) 0.5 MG tablet Take 1-2 tablets 45 minutes before MRI, may take a 3rd if needed 03/10/19   Suzzanne Cloud, NP  aspirin 81 MG chewable tablet Chew by mouth daily.    [provider]  budesonide (PULMICORT) 0.25 MG/2ML nebulizer solution Take 2 mLs (0.25 mg total) by nebulization 2 (two) times daily. 11/24/18   Swayze, Ava, DO  calcium carbonate (CALCIUM 600) 600 MG TABS tablet Take 600 mg by mouth daily before supper.     [provider]  clopidogrel (PLAVIX) 75 MG tablet Take 1 tablet (75 mg total) by mouth daily. 06/20/18   Thurnell Lose, MD  gabapentin (NEURONTIN) 100 MG capsule Take 1 capsule (100 mg total) by mouth 3 (three) times daily. Patient taking differently: Take 100 mg by mouth. 100 mg in the morning and 200 at lunch and bedtime 11/24/18   Swayze, Ava, DO  HYDROcodone-acetaminophen (NORCO/VICODIN) 5-325 MG tablet Take 1 tablet by mouth every 12 (twelve) hours as needed for moderate pain. 06/20/18   Thurnell Lose, MD  lisinopril (ZESTRIL) 2.5 MG tablet Take 1 tablet (2.5 mg total) by mouth daily. 11/24/18   Swayze, Ava, DO  MAGNESIUM PO Take 1 tablet by mouth daily before supper.    [provider]  meclizine (ANTIVERT) 12.5 MG tablet Take 12.5 mg by mouth 3 (three) times daily as needed for dizziness or nausea.  09/30/18   [provider]  Menthol, Topical Analgesic, (ICY HOT EX) Apply 1 spray topically every 8 (eight) hours as  needed (pain).    [provider]  metFORMIN (GLUCOPHAGE) 500 MG tablet Take by mouth 2 (two) times daily with a meal.    [provider]  metoprolol tartrate (LOPRESSOR) 50 MG tablet Take 50 mg by mouth 2 (two) times daily.    [provider]  Omega-3 Fatty Acids (FISH OIL PO) Take 1 capsule by mouth daily.    [provider]  ondansetron (ZOFRAN) 4 MG tablet Take 4 mg by mouth at bedtime as needed for nausea or vomiting.  06/05/18   [provider]  ONE TOUCH ULTRA TEST test strip  09/12/16   [provider]  pantoprazole (PROTONIX) 40 MG tablet Take 40 mg by mouth 2 (two) times daily.  12/17/13   [provider]  polyethylene glycol (MIRALAX / GLYCOLAX) 17 g packet Take by mouth. 12/02/18   [provider]  pravastatin (PRAVACHOL) 40 MG tablet Take 40 mg by mouth daily before supper.     [provider]  torsemide (DEMADEX) 20 MG tablet Take 63m in the AM and 29min the PM. 03/02/19   Bensimhon, DaShaune PascalMD  traZODone (DESYREL) 50 MG tablet Take 50 mg by mouth at bedtime.    [provider]     Family History  Problem Relation Age of Onset  . Stroke Mother   . Hypertension Mother   . Kidney failure Father     Social History   Socioeconomic History  . Marital status: Widowed    Spouse name: Not on file  . Number of children: Not on file  . Years of education: Not on file  . Highest education level: Not on file  Occupational History  . Not on file  Tobacco Use  . Smoking status: Former Smoker    Packs/day: 1.50    Years: 56.00    Pack years: 84.00    Types: Cigarettes    Quit date: 07/07/2013    Years since quitting: 5.8  . Smokeless tobacco: Former Systems developer    Quit date: 02/25/2013  Substance and Sexual Activity  . Alcohol use: No  . Drug use: No  . Sexual activity: Never  Other Topics Concern  . Not on file  Social History Narrative  . Not on file   Social Determinants of Health    Financial Resource Strain:   . Difficulty of Paying Living Expenses: Not on file  Food Insecurity:   . Worried About Charity fundraiser in the Last Year: Not on file  . Ran Out of Food in the Last Year: Not on file  Transportation Needs:   . Lack of Transportation (Medical): Not on file  . Lack of Transportation (Non-Medical): Not on file  Physical Activity:   . Days of Exercise per Week: Not on file  . Minutes of Exercise per Session: Not on file  Stress:   . Feeling of Stress : Not on file  Social Connections:   . Frequency of Communication with Friends and Family: Not on file  . Frequency of Social Gatherings with Friends and Family: Not on file  . Attends Religious Services: Not on file  . Active Member of Clubs or Organizations: Not on file  . Attends Archivist Meetings: Not on file  . Marital Status: Not on file     Review of Systems: A 12 point ROS discussed and pertinent positives are indicated in the HPI above.  All other systems are negative.  Review of Systems  Vital Signs: There were no vitals taken for this visit.  Physical Exam Constitutional:      General: She is not in acute distress.    Appearance: She is obese. She is not ill-appearing.      Imaging: IR ANGIO INTRA EXTRACRAN SEL COM CAROTID INNOMINATE UNI R MOD SED  Result Date: 05/14/2019 CLINICAL DATA:  Repeated spells of vertebrobasilar ischemia with symptoms of dizziness, vertigo and gait instability and falling.  EXAM: IR BILATERAL ANGIO VERTERBRAL SELECTIVE SUBCALVIAN INNOMNATE WITH MODERATE SEDATION  COMPARISON:  MRA of the head and neck of April 01, 2019.  MEDICATIONS: Heparin 1000 units IV; no antibiotic was administered within 1 hour of the procedure.  ANESTHESIA/SEDATION: Versed 0.5 mg IV; Fentanyl 25 mcg IV  Moderate Sedation Time:  25 minutes  The patient was continuously monitored during the procedure by the interventional radiology nurse under my direct supervision.   CONTRAST:  Isovue 300 approximately 100 mL.  FLUOROSCOPY TIME:  Fluoroscopy Time: 9 minutes 48 seconds (672 mGy).  COMPLICATIONS: None immediate.  TECHNIQUE: Informed written consent was obtained from the patient after a thorough discussion of the procedural risks, benefits  and alternatives. All questions were addressed. Maximal Sterile Barrier Technique was utilized including caps, mask, sterile gowns, sterile gloves, sterile drape, hand hygiene and skin antiseptic. A timeout was performed prior to the initiation of the procedure.  The right groin was prepped and draped in the usual sterile fashion. Thereafter using modified Seldinger technique, transfemoral access into the right common femoral artery was obtained without difficulty. Over a 0.035 inch guidewire, a 5 French Pinnacle sheath was inserted. Through this, and also over 0.035 inch guidewire, a 5 [place] JB 1 catheter was advanced to the aortic arch region and selectively positioned in the innominate artery and the left vertebral artery. An arch aortogram was performed using a pigtail diagnostic catheter.  FINDINGS: A selective innominate arteriogram demonstrates extensive filling defects in the innominate artery with near complete occlusion extending into the right subclavian artery with complete occlusion.  Flow is noted into the right common carotid artery. Mildly decompressed right common carotid artery demonstrates opacification to the carotid bifurcation with evidence of a previous endarterectomy.  The right external carotid artery and its major branches demonstrate wide patency. The right internal carotid artery is seen to opacify to the cranial skull base. The petrous, the cavernous and the supraclinoid segments are widely patent.  Grossly, the right middle cerebral artery demonstrates wide patency into delayed arterial and capillary phases. Motion artifact precludes fine detail of the right anterior cerebral artery.  The left vertebral  artery origin is widely patent. The vessel demonstrates mild to moderate tortuosity proximally. More distally, the dominant vertebral artery is seen to opacify to the cranial skull base. Wide patency is seen of the left vertebrobasilar junction and the left posterior-inferior cerebellar artery.  The basilar artery, the duplicated superior cerebellar arteries, and the anterior-inferior cerebellar arteries demonstrate patency into the capillary and venous phases. There is prompt retrograde opacification in the right vertebrobasilar junction and into the cervical right vertebral artery with opacification of the right subclavian artery. There is retrograde opacification of the subclavian artery to its complete occlusion at its origin.  Also seen is retrograde opacification via the right posterior communicating artery of the right internal carotid supraclinoid segments and subsequently the right middle cerebral artery distribution.  Unopacified blood is seen in the middle cerebral artery distribution probably from antegrade flow from the right internal carotid artery as described above.  The arch aortogram demonstrates again near complete occlusion of the innominate artery with antegrade flow noted into the right common carotid artery as described above.  The left common carotid artery demonstrates severe stenosis at its origin. However, flow is noted more distally in the left common carotid artery to the carotid bifurcation with antegrade flow noted into the left internal carotid artery distally to the cranial skull base. The left external carotid artery origin appears widely patent.  The left subclavian artery in its stented segment demonstrates mild to moderate stenosis secondary to  neointimal hyperplasia.  IMPRESSION: Severe pre occlusive stenosis of the innominate artery with antegrade flow noted in the right common carotid artery.  Angiographically occluded right subclavian artery proximal to the origin  of the right vertebral artery.  A right subclavian steal phenomenon from the left vertebral artery injection.  Partial reconstitution of the right internal carotid artery supraclinoid segment and right middle cerebral artery via the right posterior communicating artery from the left vertebral artery injection.  Mild-to-moderate intra stent narrowing of the left subclavian stent secondary to neointimal hyperplasia.  PLAN: The above findings were reviewed with the patient.  The patient is scheduled for outpatient consultation regarding angiographic findings and subsequent management.   Electronically Signed   By: Luanne Bras M.D.   On: 05/15/2019 12:42   IR ANGIO VERTEBRAL SEL SUBCLAVIAN INNOMINATE BILAT MOD SED  Result Date: 05/19/2019 CLINICAL DATA:  Repeated spells of vertebrobasilar ischemia with symptoms of dizziness, vertigo and gait instability and falling. EXAM: IR BILATERAL ANGIO VERTERBRAL SELECTIVE SUBCALVIAN INNOMNATE WITH MODERATE SEDATION COMPARISON:  MRA of the head and neck of April 01, 2019. MEDICATIONS: Heparin 1000 units IV; no antibiotic was administered within 1 hour of the procedure. ANESTHESIA/SEDATION: Versed 0.5 mg IV; Fentanyl 25 mcg IV Moderate Sedation Time:  25 minutes The patient was continuously monitored during the procedure by the interventional radiology nurse under my direct supervision. CONTRAST:  Isovue 300 approximately 100 mL. FLUOROSCOPY TIME:  Fluoroscopy Time: 9 minutes 48 seconds (672 mGy). COMPLICATIONS: None immediate. TECHNIQUE: Informed written consent was obtained from the patient after a thorough discussion of the procedural risks, benefits and alternatives. All questions were addressed. Maximal Sterile Barrier Technique was utilized including caps, mask, sterile gowns, sterile gloves, sterile drape, hand hygiene and skin antiseptic. A timeout was performed prior to the initiation of the procedure. The right groin was prepped and draped in the usual  sterile fashion. Thereafter using modified Seldinger technique, transfemoral access into the right common femoral artery was obtained without difficulty. Over a 0.035 inch guidewire, a 5 French Pinnacle sheath was inserted. Through this, and also over 0.035 inch guidewire, a 5 [place] JB 1 catheter was advanced to the aortic arch region and selectively positioned in the innominate artery and the left vertebral artery. An arch aortogram was performed using a pigtail diagnostic catheter. FINDINGS: A selective innominate arteriogram demonstrates extensive filling defects in the innominate artery with near complete occlusion extending into the right subclavian artery with complete occlusion. Flow is noted into the right common carotid artery. Mildly decompressed right common carotid artery demonstrates opacification to the carotid bifurcation with evidence of a previous endarterectomy. The right external carotid artery and its major branches demonstrate wide patency. The right internal carotid artery is seen to opacify to the cranial skull base. The petrous, the cavernous and the supraclinoid segments are widely patent. Grossly, the right middle cerebral artery demonstrates wide patency into delayed arterial and capillary phases. Motion artifact precludes fine detail of the right anterior cerebral artery. The left vertebral artery origin is widely patent. The vessel demonstrates mild to moderate tortuosity proximally. More distally, the dominant vertebral artery is seen to opacify to the cranial skull base. Wide patency is seen of the left vertebrobasilar junction and the left posterior-inferior cerebellar artery. The basilar artery, the duplicated superior cerebellar arteries, and the anterior-inferior cerebellar arteries demonstrate patency into the capillary and venous phases. There is prompt retrograde opacification in the right vertebrobasilar junction and into the cervical right vertebral artery with opacification  of the right subclavian artery. There is retrograde opacification of the subclavian artery to its complete occlusion at its origin. Also seen is retrograde opacification via the right posterior communicating artery of the right internal carotid supraclinoid segments and subsequently the right middle cerebral artery distribution. Unopacified blood is seen in the middle cerebral artery distribution probably from antegrade flow from the right internal carotid artery as described above. The arch aortogram demonstrates again near complete occlusion of the innominate artery with antegrade flow noted into the right common carotid artery as described above. The left common carotid artery demonstrates severe stenosis at its  origin. However, flow is noted more distally in the left common carotid artery to the carotid bifurcation with antegrade flow noted into the left internal carotid artery distally to the cranial skull base. The left external carotid artery origin appears widely patent. The left subclavian artery in its stented segment demonstrates mild to moderate stenosis secondary to neointimal hyperplasia. IMPRESSION: Severe pre occlusive stenosis of the innominate artery with antegrade flow noted in the right common carotid artery. Angiographically occluded right subclavian artery proximal to the origin of the right vertebral artery. A right subclavian steal phenomenon from the left vertebral artery injection. Partial reconstitution of the right internal carotid artery supraclinoid segment and right middle cerebral artery via the right posterior communicating artery from the left vertebral artery injection. Mild-to-moderate intra stent narrowing of the left subclavian stent secondary to neointimal hyperplasia. PLAN: The above findings were reviewed with the patient. The patient is scheduled for outpatient consultation regarding angiographic findings and subsequent management. Electronically Signed   By: Luanne Bras M.D.   On: 05/15/2019 12:42    Labs:  CBC: Recent Labs    11/21/18 0525 01/27/19 1904 04/30/19 0643 05/14/19 0652  WBC 6.3 8.9 8.1 7.7  HGB 10.9* 11.6* 11.9* 11.5*  HCT 37.7 37.0 37.9 37.1  PLT 224 304 333 346    COAGS: Recent Labs    06/16/18 1743 04/30/19 0643 05/14/19 0652  INR 1.1 1.0 0.9  APTT 28  --   --     BMP: Recent Labs    01/13/19 1125 01/27/19 1904 04/30/19 0643 05/14/19 0652  NA 135 131* 139 137  K 4.4 3.6 4.1 4.1  CL 92* 90* 96* 94*  CO2 _0 GLUCOSE 214* 193* 188* 203*  BUN 28* 28* 22 24*  CALCIUM 9.3 9.4 10.0 9.6  CREATININE 1.09* 1.20* 0.98 1.08*  GFRNONAA 49* 43* 55* 49*  GFRAA 56* 50* >60 57*    LIVER FUNCTION TESTS: Recent Labs    11/17/18 0449 11/18/18 0431 11/19/18 0222 01/27/19 1904  BILITOT 0.4 0.7 0.5 0.7  AST 34 34 44* 23  ALT _1 ALKPHOS 44 50 47 59  PROT 6.5 7.0 6.3* 7.0  ALBUMIN 3.6 3.9 3.5 4.0    TUMOR MARKERS: No results for input(s): AFPTM, CEA, CA199, CHROMGRNA in the last 8760 hours.  Assessment and Plan: Persistent dizziness and nausea Severe pre occlusive stenosis of the innominate artery with antegrade flow noted in the right common carotid artery. Angiographically occluded right subclavian artery proximal to the origin of the right vertebral artery. A right subclavian steal phenomenon from the left vertebral artery injection. Partial reconstitution of the right internal carotid artery supraclinoid segment and right middle cerebral artery via the right posterior communicating artery from the left vertebral artery injection. Mild-to-moderate intra stent narrowing of the left subclavian stent secondary to neointimal hyperplasia. We discussed multiple options with the pt and her niece today with the primary goal to improve posterior circulation to abate her sxs. She understands given the severity of her disease, she is at considerably higher risk for complications during and/or after the  procedure. Intervention may require a team approach so we will reach out to our vascular surgery colleagues as we feel she may need direct access for distal protection from thrombus. She may also be a significant anesthesia risk given her comorbidities and she is to follow up with her Cardiologist later this month as well to discuss her risks for general anesthesia  Thank you for this interesting consult.  I greatly enjoyed meeting Caitlin Oliver and look forward to participating in their care.  A copy of this report was sent to the requesting provider on this date.  Electronically Signed: Ascencion Dike 05/21/2019, 2:39 PM   I spent a total of 20 minutes in face to face in clinical consultation, greater than 50% of which was counseling/coordinating care for symptomatic carotid/vertebral disease

## 2019-05-21 NOTE — Telephone Encounter (Signed)
I called and spoke to the patient. I let her know we are placing an urgent referral for vascular surgery. Drink plenty of water, any new or worsening symptoms, needs to go to the hospital immediately. I also called her niece, [name].

## 2019-05-21 NOTE — Progress Notes (Signed)
This patient appears to be having subclavian steal as per angio and clinical symptoms. She has seen Dr Trula Slade vascular surgeon in past for subclavian stent and needs urgent referral for the same to him

## 2019-05-21 NOTE — Telephone Encounter (Signed)
Urgent referral for vascular surgery was placed.

## 2019-05-21 NOTE — Telephone Encounter (Signed)
I received a note from Dr. Leonie Man about this patient.  She appears to be having subclavian steal per angio and clinical symptoms.  She needs an urgent referral, Dr Trula Slade vascular surgeon in past for subclavian stent and needs urgent referral for the same to him. Can we contact their office today.

## 2019-05-21 NOTE — Telephone Encounter (Signed)
Called and spoke to [facility] and spoke to her at [facility] and Cherylann Ratel is going to relay urgent message to Nurse and doctor .

## 2019-05-21 NOTE — Telephone Encounter (Signed)
I spoke to [place], C and she was working on this for pt.

## 2019-05-25 NOTE — Telephone Encounter (Signed)
Patient scheduled with Dr. Donnetta Hutching at 1:20 05/26/2019. Patient stated she could not go tomorrow . I will call back and get her another apt.

## 2019-05-25 NOTE — Telephone Encounter (Signed)
Called and spoke to [place] and [name] . They are working on Urgent referral . Patient was already established with Dr. [name] . Patient refuses to see Dr. [name] . I called vein and vascular back and relayed message . They will call asap as soon as they get approval from doctor.  Patient is aware of status I have talked to her.

## 2019-05-25 NOTE — Telephone Encounter (Signed)
Called vein and vascular back and carla stated the patient need to call back to schedule so they can compare with her time schedule. I CX apt for 05/26/2019 with Dr. Donnetta Hutching . Called and spoke to patient relayed what she needed to do she understood and will call them . Provided her telephone number as well. Thanks Hinton Dyer

## 2019-05-25 NOTE — Telephone Encounter (Signed)
Great, will leave her in your capable hands.

## 2019-05-25 NOTE — Telephone Encounter (Signed)
Pt called back and had not heard from vascualr surgery as yet.  [name], niece is contact [phone].

## 2019-05-25 NOTE — Telephone Encounter (Signed)
I called patient at 8:30 am this Morning I have been in contact with vein and vascular  Already patient is aware that I will be calling her back .

## 2019-05-25 NOTE — Telephone Encounter (Signed)
LMVM for pt that was following up on the referral to vascular surgeon.  Return call when you can.

## 2019-05-26 ENCOUNTER — Ambulatory Visit: Payer: PPO | Admitting: Vascular Surgery

## 2019-05-26 ENCOUNTER — Encounter: Payer: Self-pay | Admitting: Vascular Surgery

## 2019-05-26 ENCOUNTER — Other Ambulatory Visit: Payer: Self-pay

## 2019-05-26 ENCOUNTER — Ambulatory Visit (INDEPENDENT_AMBULATORY_CARE_PROVIDER_SITE_OTHER): Payer: PPO | Admitting: Vascular Surgery

## 2019-05-26 VITALS — BP 118/66 | HR 75 | Temp 97.9°F | Resp 20 | Ht 64.0 in | Wt 173.0 lb

## 2019-05-26 DIAGNOSIS — I771 Stricture of artery: Secondary | ICD-10-CM | POA: Diagnosis not present

## 2019-05-26 NOTE — Progress Notes (Addendum)
[facility] [place]  Patient name: Caitlin Oliver MRN: [id number] DOB: 1940/08/30 Sex: female  REASON FOR VISIT: Evaluation of innominate and right subclavian stenosis  HPI: Caitlin Oliver is a 79 y.o. female with an extremely complicated past medical history and current situation.  He has undergone multiple surgeries and interventions in the past with Dr. Trula Slade and our practice.  For unknown reasons she is requested not to see Dr. Trula Slade for further evaluation.  She underwent right carotid endarterectomy in 2014 and left carotid endarterectomy in 2015.  She underwent arch arteriography in August 2017 and had a left subclavian stent placed for high-grade stenosis.  She now presents with a multitude of symptoms.  She reports worsening chronic headache.  She remains dizzy and nauseous continuously.  She reports perioral numbness and tingling and reports that she frequently bites her tongue she also reports numbness and tingling in both hands.  Her niece is one of her primary caregivers and is a Marine scientist.  She is on the phone for discussion as well due to Covid visitation restrictions.  She has undergone evaluation by neurology and has had a formal arch and carotid arteriogram and neuro interventional radiology.  I have these images for review.  She underwent CT angiogram in March 2020.  This showed moderate innominate and right subclavian stenosis.  She underwent formal arch and cerebral arteriogram on 05/14/2019.  This revealed extreme eccentric plaque in her innominate artery with high-grade stenosis.  She had complete occlusion at the origin of her right subclavian artery.  She did have vertebral steal syndrome with retrograde flow in her right vertebral artery.  She had patency of her left subclavian stent and no evidence of carotid bifurcation disease  Past Medical History:  Diagnosis Date  . Anxiety   . Carotid artery disease ([place])   .  Claudication ([place])   . COPD (chronic obstructive pulmonary disease) ([place])   . Diabetes mellitus   . Dizzy   . GERD (gastroesophageal reflux disease)   . History of kidney stones   . Hypercholesteremia   . Hypertension   . 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 Stomach ulcer   . Tobacco abuse     Family History  Problem Relation Age of Onset  . Stroke Mother   . Hypertension Mother   . Kidney failure Father     SOCIAL HISTORY: Social History   Tobacco Use  . Smoking status: Former Smoker    Packs/day: 1.50    Years: 56.00    Pack years: 84.00    Types: Cigarettes    Quit date: 07/07/2013    Years since quitting: 5.8  . Smokeless tobacco: Former Systems developer    Quit date: 02/25/2013  Substance Use Topics  . Alcohol use: No    No Known Allergies  Current Outpatient Medications  Medication Sig Dispense Refill  . albuterol (PROVENTIL HFA;VENTOLIN HFA) 108 (90 Base) MCG/ACT inhaler Inhale 2 puffs into the lungs every 6 (six) hours as needed for wheezing or shortness of breath.     . ALPRAZolam (XANAX) 0.5 MG tablet Take 1-2 tablets 45 minutes before MRI, may take a 3rd if needed 3 tablet 0  . aspirin 81 MG chewable tablet Chew by mouth daily.    . budesonide (PULMICORT) 0.25 MG/2ML nebulizer solution Take 2 mLs (0.25 mg total) by nebulization 2 (two) times daily. 60 mL 12  . calcium carbonate (CALCIUM 600)  600 MG TABS tablet Take 600 mg by mouth daily before supper.     . clopidogrel (PLAVIX) 75 MG tablet Take 1 tablet (75 mg total) by mouth daily.    [name] gabapentin (NEURONTIN) 100 MG capsule Take 1 capsule (100 mg total) by mouth 3 (three) times daily. (Patient taking differently: Take 100 mg by mouth. 100 mg in the morning and 200 at lunch and bedtime) 90 capsule 0  . HYDROcodone-acetaminophen (NORCO/VICODIN) 5-325 MG tablet Take 1 tablet by mouth every 12 (twelve) hours as needed for moderate pain. 10 tablet 0  . lisinopril (ZESTRIL)  2.5 MG tablet Take 1 tablet (2.5 mg total) by mouth daily. 30 tablet 0  . MAGNESIUM PO Take 1 tablet by mouth daily before supper.    . meclizine (ANTIVERT) 12.5 MG tablet Take 12.5 mg by mouth 3 (three) times daily as needed for dizziness or nausea.     . Menthol, Topical Analgesic, (ICY HOT EX) Apply 1 spray topically every 8 (eight) hours as needed (pain).    . metFORMIN (GLUCOPHAGE) 500 MG tablet Take by mouth 2 (two) times daily with a meal.    . metoprolol tartrate (LOPRESSOR) 50 MG tablet Take 50 mg by mouth 2 (two) times daily.    . Omega-3 Fatty Acids (FISH OIL PO) Take 1 capsule by mouth daily.    . ondansetron (ZOFRAN) 4 MG tablet Take 4 mg by mouth at bedtime as needed for nausea or vomiting.     . ONE TOUCH ULTRA TEST test strip     . pantoprazole (PROTONIX) 40 MG tablet Take 40 mg by mouth 2 (two) times daily.     . polyethylene glycol (MIRALAX / GLYCOLAX) 17 g packet Take by mouth.    . pravastatin (PRAVACHOL) 40 MG tablet Take 40 mg by mouth daily before supper.     . torsemide (DEMADEX) 20 MG tablet Take 60m in the AM and 221min the PM. 90 tablet 3  . traZODone (DESYREL) 50 MG tablet Take 50 mg by mouth at bedtime.     No current facility-administered medications for this visit.    REVIEW OF SYSTEMS:  _0  denotes positive finding, _1  denotes negative finding Cardiac  Comments:  Chest pain or chest pressure:    Shortness of breath upon exertion: x   Short of breath when lying flat: x   Irregular heart rhythm:        Vascular    Pain in calf, thigh, or hip brought on by ambulation:    Pain in feet at night that wakes you up from your sleep:     Blood clot in your veins:    Leg swelling:           PHYSICAL EXAM: Vitals:   05/26/19 1326  BP: 118/66  Pulse: 75  Resp: 20  Temp: 97.9 F (36.6 C)  SpO2: 100%  Weight: 173 lb (78.5 kg)  Height: _2  (1.626 m)    GENERAL: The patient is a well-nourished female, in no acute distress. The vital signs are  documented above. CARDIOVASCULAR: 2+ left radial pulse.  Absent right radial pulse.  Carotid arteries without bruits bilaterally PULMONARY: There is good air exchange  MUSCULOSKELETAL: There are no major deformities or cyanosis. NEUROLOGIC: No focal weakness or paresthesias are detected. SKIN: There are no ulcers or rashes noted. PSYCHIATRIC: The patient has a normal affect.  DATA:  CT and formal catheter arteriogram discussed above  MEDICAL ISSUES: I had a extremely  long discussion with the patient and her niece.  I explained the difficulty in determining whether her vague nonfocal symptoms had any relationship but certainly could be consistent with vertebrobasilar insufficiency.  She has an unsteady gait and has been following related to the dizziness and reports that she is miserable with an extremely poor quality of life.  She has not had any focal deficits.  She has severe pulmonary disease and would not tolerate a median sternotomy for direct repair of her great vessel disease.  I explained it would be very challenging to correct her innominate and right subclavian occlusion which could be responsible for her vertebrobasilar insufficiency.  I will review films with surgical colleagues to suggest a plan for treatment.  This could involve hybrid treatment in the operating room.  Also explained that this would require general anesthesia and would require a cardiac evaluation.  She is to see cardiology already in 1 week for further discussion.  I did explain that fortunately this is not emergent and will require input from multiple providers to make recommendation regarding the need for an intervention and what that intervention would be.  She understands and will be awaiting our further discussion    Rosetta Posner, MD [facility] [phone] Pager [phone]     I have reviewed the patient's our transferable arteriogram and CT scan  from March 2020 with Dr. Trula Slade and Carlis Abbott.  Also discussed her current clinical symptoms.  Her CT scan from nearly a year ago showed apparent severe stenosis at the origin of her right common carotid artery and at that time her right subclavian artery was patent.  Difficult to visualize the proximal carotid artery on images available from the recent cerebral arteriogram.  Will obtain this repeat CT angiogram of her neck for better definition of her actual anatomy and degree of stenosis.  Will make further recommendations pending this.  Although her symptoms are posterior circulation, she is having diminished flow to the right anterior circulation due to the innominate stenosis and known proximal right common carotid stenosis.  Could potentially treat her with stenting from her innominate into her right common carotid artery if her proximal right subclavian artery is already occluded.  Could then perform a carotid to subclavian bypass.  She has very severe cardiac and pulmonary compromise and would obviously try to minimize any intervention under general anesthesia.  We will make further recommendations following repeat CT scan

## 2019-05-27 ENCOUNTER — Other Ambulatory Visit: Payer: Self-pay

## 2019-05-27 DIAGNOSIS — I6523 Occlusion and stenosis of bilateral carotid arteries: Secondary | ICD-10-CM

## 2019-05-31 DIAGNOSIS — I69398 Other sequelae of cerebral infarction: Secondary | ICD-10-CM | POA: Diagnosis not present

## 2019-05-31 DIAGNOSIS — J449 Chronic obstructive pulmonary disease, unspecified: Secondary | ICD-10-CM | POA: Diagnosis not present

## 2019-05-31 DIAGNOSIS — I272 Pulmonary hypertension, unspecified: Secondary | ICD-10-CM | POA: Diagnosis not present

## 2019-05-31 DIAGNOSIS — Z85118 Personal history of other malignant neoplasm of bronchus and lung: Secondary | ICD-10-CM | POA: Diagnosis not present

## 2019-06-03 DIAGNOSIS — J449 Chronic obstructive pulmonary disease, unspecified: Secondary | ICD-10-CM | POA: Diagnosis not present

## 2019-06-03 DIAGNOSIS — I5032 Chronic diastolic (congestive) heart failure: Secondary | ICD-10-CM | POA: Diagnosis not present

## 2019-06-03 DIAGNOSIS — R0602 Shortness of breath: Secondary | ICD-10-CM | POA: Diagnosis not present

## 2019-06-03 DIAGNOSIS — R0902 Hypoxemia: Secondary | ICD-10-CM | POA: Diagnosis not present

## 2019-06-04 ENCOUNTER — Ambulatory Visit: Payer: PPO | Admitting: Neurology

## 2019-06-08 ENCOUNTER — Other Ambulatory Visit: Payer: Self-pay

## 2019-06-08 ENCOUNTER — Ambulatory Visit
Admission: RE | Admit: 2019-06-08 | Discharge: 2019-06-08 | Disposition: A | Discharge status: Home / Self Care | Payer: PPO | Source: Ambulatory Visit | Attending: Vascular Surgery | Admitting: Vascular Surgery

## 2019-06-08 DIAGNOSIS — I6523 Occlusion and stenosis of bilateral carotid arteries: Secondary | ICD-10-CM | POA: Diagnosis not present

## 2019-06-08 MED ORDER — IOPAMIDOL (ISOVUE-300) INJECTION 61%
60.0000 mL | Freq: Once | INTRAVENOUS | Status: AC | PRN
  Administered 2019-06-08: 60 mL via INTRAVENOUS

## 2019-06-09 ENCOUNTER — Encounter: Payer: Self-pay | Admitting: Vascular Surgery

## 2019-06-09 ENCOUNTER — Ambulatory Visit (INDEPENDENT_AMBULATORY_CARE_PROVIDER_SITE_OTHER): Payer: PPO | Admitting: Vascular Surgery

## 2019-06-09 VITALS — BP 132/69 | HR 86 | Resp 20 | Ht 64.0 in | Wt 173.0 lb

## 2019-06-09 DIAGNOSIS — I771 Stricture of artery: Secondary | ICD-10-CM

## 2019-06-09 NOTE — Progress Notes (Signed)
Vascular and Vein Specialist of [place]  Patient name: Caitlin Oliver MRN: [id number] DOB: [birth year] Sex: female  REASON FOR VISIT: Continued discussion of posterior circulation symptoms after CT angiogram  HPI: Caitlin Oliver is a 79 y.o. female here today for further discussion.  We are also discussing this with her niece [name] who helps with her medical decision making by telephone.  She reports that she continues to be quite miserable with chronic dizziness and nausea.  Reports this has been progressive for 6 months now.she has been evaluated with neurology with an extensive work-up and is felt that this is related to posterior circulation ischemia.  I had seen her 2 weeks ago after a cerebral arteriogram.  I recommended CT angiogram for further definition of the degree of stenosis in her innominate and right subclavian and carotid artery.  Also discussed the case with Dr. Monica Martinez for possible advanced endovascular treatment with hybrid OR treatment of her difficult complex problem.  She has no new symptoms.  Past Medical History:  Diagnosis Date  . Anxiety   . Carotid artery disease ([place])   . Claudication ([place])   . COPD (chronic obstructive pulmonary disease) ([place])   . Diabetes mellitus   . Dizzy   . GERD (gastroesophageal reflux disease)   . History of kidney stones   . Hypercholesteremia   . Hypertension   . 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 Stomach ulcer   . Tobacco abuse     Family History  Problem Relation Age of Onset  . Stroke Mother   . Hypertension Mother   . Kidney failure Father     SOCIAL HISTORY: Social History   Tobacco Use  . Smoking status: Former Smoker    Packs/day: 1.50    Years: 56.00    Pack years: 84.00    Types: Cigarettes    Quit date: 07/07/2013    Years since quitting: 5.9  . Smokeless tobacco: Former Systems developer    Quit date: 02/25/2013    Substance Use Topics  . Alcohol use: No    No Known Allergies  Current Outpatient Medications  Medication Sig Dispense Refill  . albuterol (PROVENTIL HFA;VENTOLIN HFA) 108 (90 Base) MCG/ACT inhaler Inhale 2 puffs into the lungs every 6 (six) hours as needed for wheezing or shortness of breath.     . ALPRAZolam (XANAX) 0.5 MG tablet Take 1-2 tablets 45 minutes before MRI, may take a 3rd if needed 3 tablet 0  . aspirin 81 MG chewable tablet Chew by mouth daily.    . budesonide (PULMICORT) 0.25 MG/2ML nebulizer solution Take 2 mLs (0.25 mg total) by nebulization 2 (two) times daily. 60 mL 12  . calcium carbonate (CALCIUM 600) 600 MG TABS tablet Take 600 mg by mouth daily before supper.     . clopidogrel (PLAVIX) 75 MG tablet Take 1 tablet (75 mg total) by mouth daily.    [name] gabapentin (NEURONTIN) 100 MG capsule Take 1 capsule (100 mg total) by mouth 3 (three) times daily. (Patient taking differently: Take 100 mg by mouth. 100 mg in the morning and 200 at lunch and bedtime) 90 capsule 0  . HYDROcodone-acetaminophen (NORCO/VICODIN) 5-325 MG tablet Take 1 tablet by mouth every 12 (twelve) hours as needed for moderate pain. 10 tablet 0  . lisinopril (ZESTRIL) 2.5 MG tablet Take 1 tablet (2.5 mg total) by mouth daily. 30 tablet 0  . MAGNESIUM PO Take 1 tablet  by mouth daily before supper.    . meclizine (ANTIVERT) 12.5 MG tablet Take 12.5 mg by mouth 3 (three) times daily as needed for dizziness or nausea.     . Menthol, Topical Analgesic, (ICY HOT EX) Apply 1 spray topically every 8 (eight) hours as needed (pain).    . metFORMIN (GLUCOPHAGE) 500 MG tablet Take by mouth 2 (two) times daily with a meal.    . metoprolol tartrate (LOPRESSOR) 50 MG tablet Take 50 mg by mouth 2 (two) times daily.    . Omega-3 Fatty Acids (FISH OIL PO) Take 1 capsule by mouth daily.    . ondansetron (ZOFRAN) 4 MG tablet Take 4 mg by mouth at bedtime as needed for nausea or vomiting.     . ONE TOUCH ULTRA TEST test strip      . pantoprazole (PROTONIX) 40 MG tablet Take 40 mg by mouth 2 (two) times daily.     . polyethylene glycol (MIRALAX / GLYCOLAX) 17 g packet Take by mouth.    . pravastatin (PRAVACHOL) 40 MG tablet Take 40 mg by mouth daily before supper.     . torsemide (DEMADEX) 20 MG tablet Take 109m in the AM and 296min the PM. 90 tablet 3  . traZODone (DESYREL) 50 MG tablet Take 50 mg by mouth at bedtime.     No current facility-administered medications for this visit.    REVIEW OF SYSTEMS:  _0  denotes positive finding, _1  denotes negative finding Cardiac  Comments:  Chest pain or chest pressure:    Shortness of breath upon exertion: x   Short of breath when lying flat: x   Irregular heart rhythm:        Vascular    Pain in calf, thigh, or hip brought on by ambulation:    Pain in feet at night that wakes you up from your sleep:     Blood clot in your veins:    Leg swelling:           PHYSICAL EXAM: Vitals:   06/09/19 1530  BP: 132/69  Pulse: 86  Resp: 20  SpO2: 93%  Weight: 173 lb (78.5 kg)  Height: _2  (1.626 m)    GENERAL: The patient is a well-nourished female, in no acute distress. The vital signs are documented above. CARDIOVASCULAR: 2+ left radial pulse. Absent right radial pulse PULMONARY: There is good air exchange  MUSCULOSKELETAL: There are no major deformities or cyanosis. NEUROLOGIC: No focal weakness or paresthesias are detected. SKIN: There are no ulcers or rashes noted. PSYCHIATRIC: The patient has a normal affect.  DATA:  CT angiogram revealed near total occlusion of the innominate artery at the origin with extensive calcification of the arch. There is then is an area above the origin with mild narrowing of the innominate right at its bifurcation. There is a high-grade stenosis of the right common carotid and apparently mild stenosis of the right subclavian takeoff  MEDICAL ISSUES: Dr. ClCarlis Abbottnd myself both had extensive discussion with the patient and her  niece. She reports that she is quite miserable and cannot tolerate her current symptoms. I again explained that her symptoms do correspond with posterior circulation but there is no definitive way of knowing revascularization will correct her symptoms other than correcting her arterial flow. She clearly could not tolerate sternotomy and direct arch replacement. I do feel that she could tolerate carotid exposure. Dr. ClCarlis Abbottiscussed the option of direct exposure of her carotid and brachial artery with  retrograde stenting of her innominate artery and also of her right common carotid takeoff and potentially right subclavian takeoff as well depending on intraoperative imaging studies. I also discussed the potential for stroke despite protection with occlusion of flow to her brain during the manipulation. She underneath discussed this with her in our presence and feel that they wish to proceed with surgery as discussed. She is to see Dr. Pierre Bali for already scheduled follow-up tomorrow of her known pulmonary hypertension and congestive failure. They will discuss what preoperative optimization is required if any. We will schedule surgery following this visit.    Rosetta Posner, MD FACS [facility] [place] Tel [phone] Pager [phone]

## 2019-06-10 ENCOUNTER — Ambulatory Visit (HOSPITAL_COMMUNITY)
Admission: RE | Admit: 2019-06-10 | Discharge: 2019-06-10 | Disposition: A | Discharge status: Home / Self Care | Payer: PPO | Source: Ambulatory Visit | Attending: Internal Medicine | Admitting: Internal Medicine

## 2019-06-10 ENCOUNTER — Other Ambulatory Visit: Payer: Self-pay

## 2019-06-10 ENCOUNTER — Encounter (HOSPITAL_COMMUNITY): Payer: Self-pay | Admitting: Internal Medicine

## 2019-06-10 VITALS — BP 122/58 | HR 90 | Wt 174.4 lb

## 2019-06-10 DIAGNOSIS — Z8673 Personal history of transient ischemic attack (TIA), and cerebral infarction without residual deficits: Secondary | ICD-10-CM | POA: Diagnosis not present

## 2019-06-10 DIAGNOSIS — F419 Anxiety disorder, unspecified: Secondary | ICD-10-CM | POA: Diagnosis not present

## 2019-06-10 DIAGNOSIS — I2510 Atherosclerotic heart disease of native coronary artery without angina pectoris: Secondary | ICD-10-CM | POA: Insufficient documentation

## 2019-06-10 DIAGNOSIS — J449 Chronic obstructive pulmonary disease, unspecified: Secondary | ICD-10-CM | POA: Diagnosis not present

## 2019-06-10 DIAGNOSIS — E7800 Pure hypercholesterolemia, unspecified: Secondary | ICD-10-CM | POA: Diagnosis not present

## 2019-06-10 DIAGNOSIS — Z8249 Family history of ischemic heart disease and other diseases of the circulatory system: Secondary | ICD-10-CM | POA: Diagnosis not present

## 2019-06-10 DIAGNOSIS — Z7902 Long term (current) use of antithrombotics/antiplatelets: Secondary | ICD-10-CM | POA: Diagnosis not present

## 2019-06-10 DIAGNOSIS — E785 Hyperlipidemia, unspecified: Secondary | ICD-10-CM | POA: Insufficient documentation

## 2019-06-10 DIAGNOSIS — Z87891 Personal history of nicotine dependence: Secondary | ICD-10-CM | POA: Diagnosis not present

## 2019-06-10 DIAGNOSIS — I2721 Secondary pulmonary arterial hypertension: Secondary | ICD-10-CM

## 2019-06-10 DIAGNOSIS — Z9981 Dependence on supplemental oxygen: Secondary | ICD-10-CM | POA: Insufficient documentation

## 2019-06-10 DIAGNOSIS — Z79899 Other long term (current) drug therapy: Secondary | ICD-10-CM | POA: Insufficient documentation

## 2019-06-10 DIAGNOSIS — E118 Type 2 diabetes mellitus with unspecified complications: Secondary | ICD-10-CM | POA: Insufficient documentation

## 2019-06-10 DIAGNOSIS — R531 Weakness: Secondary | ICD-10-CM | POA: Insufficient documentation

## 2019-06-10 DIAGNOSIS — I5032 Chronic diastolic (congestive) heart failure: Secondary | ICD-10-CM | POA: Diagnosis not present

## 2019-06-10 DIAGNOSIS — I739 Peripheral vascular disease, unspecified: Secondary | ICD-10-CM | POA: Diagnosis not present

## 2019-06-10 DIAGNOSIS — Z7982 Long term (current) use of aspirin: Secondary | ICD-10-CM | POA: Insufficient documentation

## 2019-06-10 DIAGNOSIS — I272 Pulmonary hypertension, unspecified: Secondary | ICD-10-CM | POA: Diagnosis not present

## 2019-06-10 DIAGNOSIS — I110 Hypertensive heart disease with heart failure: Secondary | ICD-10-CM | POA: Diagnosis not present

## 2019-06-10 DIAGNOSIS — Z7984 Long term (current) use of oral hypoglycemic drugs: Secondary | ICD-10-CM | POA: Diagnosis not present

## 2019-06-10 DIAGNOSIS — I313 Pericardial effusion (noninflammatory): Secondary | ICD-10-CM | POA: Insufficient documentation

## 2019-06-10 DIAGNOSIS — K219 Gastro-esophageal reflux disease without esophagitis: Secondary | ICD-10-CM | POA: Diagnosis not present

## 2019-06-10 DIAGNOSIS — R42 Dizziness and giddiness: Secondary | ICD-10-CM | POA: Insufficient documentation

## 2019-06-10 DIAGNOSIS — J9611 Chronic respiratory failure with hypoxia: Secondary | ICD-10-CM | POA: Diagnosis not present

## 2019-06-10 DIAGNOSIS — E669 Obesity, unspecified: Secondary | ICD-10-CM | POA: Diagnosis not present

## 2019-06-10 MED ORDER — TORSEMIDE 20 MG PO TABS: 40.0000 mg | ORAL_TABLET | Freq: Two times a day (BID) | ORAL | 6 refills | Status: DC

## 2019-06-10 MED ORDER — POTASSIUM CHLORIDE CRYS ER 20 MEQ PO TBCR: 20.0000 meq | EXTENDED_RELEASE_TABLET | Freq: Every day | ORAL | 60 refills | Status: DC

## 2019-06-10 NOTE — Progress Notes (Signed)
ADVANCED HF CLINIC NOTE  Referring Physician: Dr Gwenlyn Found  Primary Care: Primary Cardiologist: Dr Gwenlyn Found Pulmonary: Dr Melvyn Novas   HPI  Caitlin Oliver is a 79 year old with history of COPD, DM, PAD, LCEA 2015, R CEA 2014, GERD, hyperlipidemia, anxiety, HTN,chronic respiratory failure on 2 liters at home, lung cancer, S/P L upper lobectomy 2017, and chronic diastolic heart failure.   Admitted 06/16/18 with difficulty walking with ataxia. Code Stroke called. R precentral gyrus 5 mm ischemic stroke noted. Discharged on oxygen. Discharged on asa and plavix. She was later discharged to SNF for an extended stay.   Admitted to [facility] on 11/13/18  with increased shortness of breath and chest pain. Diuresed with IV lasix and transitioned to lasix 40 mg twice a day. Had RHC/LHC with mod pulmonary htn and elevated filling pressures.  Here for routine f/u. Gets around rolling walker. SOB at rest and with any activity. + Edema in ankles and stomach. Taking torsemide 40/20. Struggling with R arm fatigue. Saw Dr. Donnetta Hutching in [facility] and found to have near total occlusion of the innominate artery at the origin with extensive calcification of the arch. There is then is an area above the origin with mild narrowing of the innominate right at its bifurcation. There is a high-grade stenosis of the right common carotid and apparently mild stenosis of the right subclavian takeoff. Has not smoked for 6 years.    PFTs 11/20 FEV1 1.25 (62%) FVC 2.03 (75%) DLCO 51%  PFTs 8/17 FEV1 1.77 (84%) FVC 2.49 (88%) DLCO 69%  RHC/LHC  11/20/2018  RA 11 PAP 79/39 (52)  TPG 21  PCWP 31 PVR 4.9  CO 4.3  CI 2.9  1. Mild to moderate, non-obstructive coronary artery disease. 2. Severe pulmonary hypertension. 3. Moderately to severely elevated left and right heart filling pressures. 4. Mildly reduced Fick cardiac output/index. 5. Severe right brachiocephalic and subclavian artery stenoses with combined gradient of ~50 mmHg and possible  reversal of flow in the right vertebral artery (subclavian steal).  ECHO 11/14/18 EF 65%  Moderate Asymmetric hypertrophy septal wall. RV mild reduced. Small pericardial effusion.    Past Medical History:  Diagnosis Date  . Anxiety   . Carotid artery disease ([place])   . Claudication ([place])   . COPD (chronic obstructive pulmonary disease) ([place])   . Diabetes mellitus   . Dizzy   . GERD (gastroesophageal reflux disease)   . History of kidney stones   . Hypercholesteremia   . Hypertension   . 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 Stomach ulcer   . Tobacco abuse     Current Outpatient Medications  Medication Sig Dispense Refill  . albuterol (PROVENTIL HFA;VENTOLIN HFA) 108 (90 Base) MCG/ACT inhaler Inhale 2 puffs into the lungs every 6 (six) hours as needed for wheezing or shortness of breath.     . ALPRAZolam (XANAX) 0.5 MG tablet Take 1-2 tablets 45 minutes before MRI, may take a 3rd if needed 3 tablet 0  . aspirin 81 MG chewable tablet Chew by mouth daily.    . budesonide (PULMICORT) 0.25 MG/2ML nebulizer solution Take 2 mLs (0.25 mg total) by nebulization 2 (two) times daily. 60 mL 12  . calcium carbonate (CALCIUM 600) 600 MG TABS tablet Take 600 mg by mouth daily before supper.     . clopidogrel (PLAVIX) 75 MG tablet Take 1 tablet (75 mg total) by mouth daily.    [name] gabapentin (NEURONTIN)  100 MG capsule Take 1 capsule (100 mg total) by mouth 3 (three) times daily. 90 capsule 0  . HYDROcodone-acetaminophen (NORCO/VICODIN) 5-325 MG tablet Take 1 tablet by mouth every 12 (twelve) hours as needed for moderate pain. 10 tablet 0  . MAGNESIUM PO Take 1 tablet by mouth daily before supper.    . meclizine (ANTIVERT) 12.5 MG tablet Take 12.5 mg by mouth 3 (three) times daily as needed for dizziness or nausea.     . Menthol, Topical Analgesic, (ICY HOT EX) Apply 1 spray topically every 8 (eight) hours as needed (pain).    . metFORMIN (GLUCOPHAGE)  500 MG tablet Take by mouth 2 (two) times daily with a meal.    . metoprolol tartrate (LOPRESSOR) 50 MG tablet Take 50 mg by mouth 2 (two) times daily.    . Omega-3 Fatty Acids (FISH OIL PO) Take 1 capsule by mouth daily.    . ondansetron (ZOFRAN) 4 MG tablet Take 4 mg by mouth at bedtime as needed for nausea or vomiting.     . ONE TOUCH ULTRA TEST test strip     . pantoprazole (PROTONIX) 40 MG tablet Take 40 mg by mouth 2 (two) times daily.     . polyethylene glycol (MIRALAX / GLYCOLAX) 17 g packet Take by mouth.    . pravastatin (PRAVACHOL) 40 MG tablet Take 40 mg by mouth daily before supper.     . torsemide (DEMADEX) 20 MG tablet Take 40mg in the AM and 20mg in the PM. 90 tablet 3  . traZODone (DESYREL) 50 MG tablet Take 50 mg by mouth at bedtime.     No current facility-administered medications for this encounter.    No Known Allergies    Social History   Socioeconomic History  . Marital status: Widowed    Spouse name: Not on file  . Number of children: Not on file  . Years of education: Not on file  . Highest education level: Not on file  Occupational History  . Not on file  Tobacco Use  . Smoking status: Former Smoker    Packs/day: 1.50    Years: 56.00    Pack years: 84.00    Types: Cigarettes    Quit date: 07/07/2013    Years since quitting: 5.9  . Smokeless tobacco: Former User    Quit date: 02/25/2013  Substance and Sexual Activity  . Alcohol use: No  . Drug use: No  . Sexual activity: Never  Other Topics Concern  . Not on file  Social History Narrative  . Not on file   Social Determinants of Health   Financial Resource Strain:   . Difficulty of Paying Living Expenses: Not on file  Food Insecurity:   . Worried About Running Out of Food in the Last Year: Not on file  . Ran Out of Food in the Last Year: Not on file  Transportation Needs:   . Lack of Transportation (Medical): Not on file  . Lack of Transportation (Non-Medical): Not on file  Physical  Activity:   . Days of Exercise per Week: Not on file  . Minutes of Exercise per Session: Not on file  Stress:   . Feeling of Stress : Not on file  Social Connections:   . Frequency of Communication with Friends and Family: Not on file  . Frequency of Social Gatherings with Friends and Family: Not on file  . Attends Religious Services: Not on file  . Active Member of Clubs or Organizations: Not   on file  . Attends Archivist Meetings: Not on file  . Marital Status: Not on file  Intimate Partner Violence:   . Fear of Current or Ex-Partner: Not on file  . Emotionally Abused: Not on file  . Physically Abused: Not on file  . Sexually Abused: Not on file      Family History  Problem Relation Age of Onset  . Stroke Mother   . Hypertension Mother   . Kidney failure Father     Vitals:   06/10/19 1151  BP: (!) 122/58  Pulse: 90  SpO2: 99%  Weight: 79.1 kg (174 lb 6 oz)   Wt Readings from Last 3 Encounters:  06/10/19 79.1 kg (174 lb 6 oz)  06/09/19 78.5 kg (173 lb)  05/26/19 78.5 kg (173 lb)    PHYSICAL EXAM: General:  Elderly. Weak appearing. Sitting in WC on O2  Sob at rest  HEENT: normal Neck: supple. JVP to jaw. Carotids 2+ bilat; no bruits. No lymphadenopathy or thryomegaly appreciated. Cor: PMI nondisplaced. Regular rate & rhythm. 2/6 TR Lungs: decreased BS throguhout Abdomen: soft, nontender, + distended. No hepatosplenomegaly. No bruits or masses. Good bowel sounds. Extremities: no cyanosis, clubbing, rash, 2+ edema Neuro: alert & orientedx3, cranial nerves grossly intact. moves all 4 extremities w/o difficulty. Affect pleasant   ASSESSMENT & PLAN:  1. Pulmonary HTN, Mixed presentation.  - she has mixed PAH/PVH. RHC (8/20)  RA 11,PAP 79/39 (52) ,PCWP 31, PVR 4.8, CO 4.3 , CI 2.9 - suspect combination of WHO Group II and III  - Repeat PFTs with obstructive/restrictive picture - Sleep study ordered but not done yet - Refuses Pulmonary Rehab due to  distance to travel - Not candidate for selective pulmonary artery vasodilators - NYHA IIIb-IV - Now with recurrent volume overload - Consider RHC in 4-6 months  2. Diastolic HF - ECHO 8/33/38 EF 65% small pericardial effusion.  - NYHA IIIb-IV - Now with recurrent volume overload - Increase torsemide to 40 bid. Add kcl 20 daily   3. Chronic Hypoxic Respiratory Failure due to COPD and H/O Lung Cancer S/P LUL lobectomy  - On home oxygen.  - F/u with Dr. Melvyn Novas  4. Suspected Sleep Apnea  -  Will need to f/u on getting home sleep study  5. Chronic dizziness - due to previous CVA +/- posterior circulation insufficiency  6. Severe PAD  - CT angiogram revealed near total occlusion of the innominate artery at the origin with extensive calcification of the arch. There is then is an area above the origin with mild narrowing of the innominate right at its bifurcation. There is a high-grade stenosis of the right common carotid and apparently mild stenosis of the right subclavian takeoff - has seen Dr. Donnetta Hutching and Dr. Carlis Abbott in [facility]  - with obesity, COPD and PAH she is at high  (but not prohibitive) risk for peri-operative CV complications. If contemplating surgery, I think it would be best to admit her several days before and perform a RHC and leave Swan in to optimize her hemodynamic state pre-operatively given size of surgery  - She agrees with intubation for surgery but would not want long-term trach or vent support if unable to wean post-op - I d/w Dr. Donnetta Hutching who agrees and will help [place] arrange a date    Glori Bickers MD 11:56 AM

## 2019-06-10 NOTE — H&P (View-Only) (Signed)
ADVANCED HF CLINIC NOTE  Referring Physician: Dr Gwenlyn Found  Primary Care: Primary Cardiologist: Dr Gwenlyn Found Pulmonary: Dr Melvyn Novas   HPI  Caitlin Oliver is a 79 year old with history of COPD, DM, PAD, LCEA 2015, R CEA 2014, GERD, hyperlipidemia, anxiety, HTN,chronic respiratory failure on 2 liters at home, lung cancer, S/P L upper lobectomy 2017, and chronic diastolic heart failure.   Admitted 06/16/18 with difficulty walking with ataxia. Code Stroke called. R precentral gyrus 5 mm ischemic stroke noted. Discharged on oxygen. Discharged on asa and plavix. She was later discharged to SNF for an extended stay.   Admitted to [facility] on 11/13/18  with increased shortness of breath and chest pain. Diuresed with IV lasix and transitioned to lasix 40 mg twice a day. Had RHC/LHC with mod pulmonary htn and elevated filling pressures.  Here for routine f/u. Gets around rolling walker. SOB at rest and with any activity. + Edema in ankles and stomach. Taking torsemide 40/20. Struggling with R arm fatigue. Saw Dr. Donnetta Hutching in [facility] and found to have near total occlusion of the innominate artery at the origin with extensive calcification of the arch. There is then is an area above the origin with mild narrowing of the innominate right at its bifurcation. There is a high-grade stenosis of the right common carotid and apparently mild stenosis of the right subclavian takeoff. Has not smoked for 6 years.    PFTs 11/20 FEV1 1.25 (62%) FVC 2.03 (75%) DLCO 51%  PFTs 8/17 FEV1 1.77 (84%) FVC 2.49 (88%) DLCO 69%  RHC/LHC  11/20/2018  RA 11 PAP 79/39 (52)  TPG 21  PCWP 31 PVR 4.9  CO 4.3  CI 2.9  1. Mild to moderate, non-obstructive coronary artery disease. 2. Severe pulmonary hypertension. 3. Moderately to severely elevated left and right heart filling pressures. 4. Mildly reduced Fick cardiac output/index. 5. Severe right brachiocephalic and subclavian artery stenoses with combined gradient of ~50 mmHg and possible  reversal of flow in the right vertebral artery (subclavian steal).  ECHO 11/14/18 EF 65%  Moderate Asymmetric hypertrophy septal wall. RV mild reduced. Small pericardial effusion.    Past Medical History:  Diagnosis Date  . Anxiety   . Carotid artery disease ([place])   . Claudication ([place])   . COPD (chronic obstructive pulmonary disease) ([place])   . Diabetes mellitus   . Dizzy   . GERD (gastroesophageal reflux disease)   . History of kidney stones   . Hypercholesteremia   . Hypertension   . 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 Stomach ulcer   . Tobacco abuse     Current Outpatient Medications  Medication Sig Dispense Refill  . albuterol (PROVENTIL HFA;VENTOLIN HFA) 108 (90 Base) MCG/ACT inhaler Inhale 2 puffs into the lungs every 6 (six) hours as needed for wheezing or shortness of breath.     . ALPRAZolam (XANAX) 0.5 MG tablet Take 1-2 tablets 45 minutes before MRI, may take a 3rd if needed 3 tablet 0  . aspirin 81 MG chewable tablet Chew by mouth daily.    . budesonide (PULMICORT) 0.25 MG/2ML nebulizer solution Take 2 mLs (0.25 mg total) by nebulization 2 (two) times daily. 60 mL 12  . calcium carbonate (CALCIUM 600) 600 MG TABS tablet Take 600 mg by mouth daily before supper.     . clopidogrel (PLAVIX) 75 MG tablet Take 1 tablet (75 mg total) by mouth daily.    [name] gabapentin (NEURONTIN)  100 MG capsule Take 1 capsule (100 mg total) by mouth 3 (three) times daily. 90 capsule 0  . HYDROcodone-acetaminophen (NORCO/VICODIN) 5-325 MG tablet Take 1 tablet by mouth every 12 (twelve) hours as needed for moderate pain. 10 tablet 0  . MAGNESIUM PO Take 1 tablet by mouth daily before supper.    . meclizine (ANTIVERT) 12.5 MG tablet Take 12.5 mg by mouth 3 (three) times daily as needed for dizziness or nausea.     . Menthol, Topical Analgesic, (ICY HOT EX) Apply 1 spray topically every 8 (eight) hours as needed (pain).    . metFORMIN (GLUCOPHAGE)  500 MG tablet Take by mouth 2 (two) times daily with a meal.    . metoprolol tartrate (LOPRESSOR) 50 MG tablet Take 50 mg by mouth 2 (two) times daily.    . Omega-3 Fatty Acids (FISH OIL PO) Take 1 capsule by mouth daily.    . ondansetron (ZOFRAN) 4 MG tablet Take 4 mg by mouth at bedtime as needed for nausea or vomiting.     . ONE TOUCH ULTRA TEST test strip     . pantoprazole (PROTONIX) 40 MG tablet Take 40 mg by mouth 2 (two) times daily.     . polyethylene glycol (MIRALAX / GLYCOLAX) 17 g packet Take by mouth.    . pravastatin (PRAVACHOL) 40 MG tablet Take 40 mg by mouth daily before supper.     . torsemide (DEMADEX) 20 MG tablet Take 23m in the AM and 233min the PM. 90 tablet 3  . traZODone (DESYREL) 50 MG tablet Take 50 mg by mouth at bedtime.     No current facility-administered medications for this encounter.    No Known Allergies    Social History   Socioeconomic History  . Marital status: Widowed    Spouse name: Not on file  . Number of children: Not on file  . Years of education: Not on file  . Highest education level: Not on file  Occupational History  . Not on file  Tobacco Use  . Smoking status: Former Smoker    Packs/day: 1.50    Years: 56.00    Pack years: 84.00    Types: Cigarettes    Quit date: 07/07/2013    Years since quitting: 5.9  . Smokeless tobacco: Former [employer] developer  Quit date: 02/25/2013  Substance and Sexual Activity  . Alcohol use: No  . Drug use: No  . Sexual activity: Never  Other Topics Concern  . Not on file  Social History Narrative  . Not on file   Social Determinants of Health   Financial Resource Strain:   . Difficulty of Paying Living Expenses: Not on file  Food Insecurity:   . Worried About Ru[employer] fundraisern the Last Year: Not on file  . Ran Out of Food in the Last Year: Not on file  Transportation Needs:   . Lack of Transportation (Medical): Not on file  . Lack of Transportation (Non-Medical): Not on file  Physical  Activity:   . Days of Exercise per Week: Not on file  . Minutes of Exercise per Session: Not on file  Stress:   . Feeling of Stress : Not on file  Social Connections:   . Frequency of Communication with Friends and Family: Not on file  . Frequency of Social Gatherings with Friends and Family: Not on file  . Attends Religious Services: Not on file  . Active Member of Clubs or Organizations: Not  on file  . Attends Archivist Meetings: Not on file  . Marital Status: Not on file  Intimate Partner Violence:   . Fear of Current or Ex-Partner: Not on file  . Emotionally Abused: Not on file  . Physically Abused: Not on file  . Sexually Abused: Not on file      Family History  Problem Relation Age of Onset  . Stroke Mother   . Hypertension Mother   . Kidney failure Father     Vitals:   06/10/19 1151  BP: (!) 122/58  Pulse: 90  SpO2: 99%  Weight: 79.1 kg (174 lb 6 oz)   Wt Readings from Last 3 Encounters:  06/10/19 79.1 kg (174 lb 6 oz)  06/09/19 78.5 kg (173 lb)  05/26/19 78.5 kg (173 lb)    PHYSICAL EXAM: General:  Elderly. Weak appearing. Sitting in WC on O2  Sob at rest  HEENT: normal Neck: supple. JVP to jaw. Carotids 2+ bilat; no bruits. No lymphadenopathy or thryomegaly appreciated. Cor: PMI nondisplaced. Regular rate & rhythm. 2/6 TR Lungs: decreased BS throguhout Abdomen: soft, nontender, + distended. No hepatosplenomegaly. No bruits or masses. Good bowel sounds. Extremities: no cyanosis, clubbing, rash, 2+ edema Neuro: alert & orientedx3, cranial nerves grossly intact. moves all 4 extremities w/o difficulty. Affect pleasant   ASSESSMENT & PLAN:  1. Pulmonary HTN, Mixed presentation.  - she has mixed PAH/PVH. RHC (8/20)  RA 11,PAP 79/39 (52) ,PCWP 31, PVR 4.8, CO 4.3 , CI 2.9 - suspect combination of WHO Group II and III  - Repeat PFTs with obstructive/restrictive picture - Sleep study ordered but not done yet - Refuses Pulmonary Rehab due to  distance to travel - Not candidate for selective pulmonary artery vasodilators - NYHA IIIb-IV - Now with recurrent volume overload - Consider RHC in 4-6 months  2. Diastolic HF - ECHO 8/33/38 EF 65% small pericardial effusion.  - NYHA IIIb-IV - Now with recurrent volume overload - Increase torsemide to 40 bid. Add kcl 20 daily   3. Chronic Hypoxic Respiratory Failure due to COPD and H/O Lung Cancer S/P LUL lobectomy  - On home oxygen.  - F/u with Dr. Melvyn Novas  4. Suspected Sleep Apnea  -  Will need to f/u on getting home sleep study  5. Chronic dizziness - due to previous CVA +/- posterior circulation insufficiency  6. Severe PAD  - CT angiogram revealed near total occlusion of the innominate artery at the origin with extensive calcification of the arch. There is then is an area above the origin with mild narrowing of the innominate right at its bifurcation. There is a high-grade stenosis of the right common carotid and apparently mild stenosis of the right subclavian takeoff - has seen Dr. Donnetta Hutching and Dr. Carlis Abbott in [facility]  - with obesity, COPD and PAH she is at high  (but not prohibitive) risk for peri-operative CV complications. If contemplating surgery, I think it would be best to admit her several days before and perform a RHC and leave Swan in to optimize her hemodynamic state pre-operatively given size of surgery  - She agrees with intubation for surgery but would not want long-term trach or vent support if unable to wean post-op - I d/w Dr. Donnetta Hutching who agrees and will help [place] arrange a date    Glori Bickers MD 11:56 AM

## 2019-06-10 NOTE — Patient Instructions (Signed)
Increase Torsemide to 40 mg (2 tabs) Twice daily   Start Potassium 20 meq (1 tab) Daily  Your physician recommends that you schedule a follow-up appointment in: 4 months  If you have any questions or concerns before your next appointment please send [place] a message through [place] or call our office at [phone].  At the [facility], you and your health needs are our priority. As part of our continuing mission to provide you with exceptional heart care, we have created designated Provider Care Teams. These Care Teams include your primary Cardiologist (physician) and Advanced Practice Providers (APPs- Physician Assistants and Nurse Practitioners) who all work together to provide you with the care you need, when you need it.   You may see any of the following providers on your designated Care Team at your next follow up: [name] Dr Glori Bickers . Dr Loralie Champagne . Darrick Grinder, NP . Lyda Jester, PA . Audry Riles, PharmD   Please be sure to bring in all your medications bottles to every appointment.

## 2019-06-11 ENCOUNTER — Other Ambulatory Visit (HOSPITAL_COMMUNITY): Payer: Self-pay | Admitting: *Deleted

## 2019-06-11 ENCOUNTER — Other Ambulatory Visit: Payer: Self-pay

## 2019-06-11 MED ORDER — POTASSIUM CHLORIDE CRYS ER 20 MEQ PO TBCR: 20.0000 meq | EXTENDED_RELEASE_TABLET | Freq: Every day | ORAL | 3 refills | Status: DC

## 2019-06-11 MED ORDER — TORSEMIDE 20 MG PO TABS: 40.0000 mg | ORAL_TABLET | Freq: Two times a day (BID) | ORAL | 3 refills | Status: DC

## 2019-06-12 ENCOUNTER — Telehealth (HOSPITAL_COMMUNITY): Payer: Self-pay | Admitting: *Deleted

## 2019-06-12 ENCOUNTER — Other Ambulatory Visit (HOSPITAL_COMMUNITY): Payer: Self-pay | Admitting: *Deleted

## 2019-06-12 ENCOUNTER — Encounter (HOSPITAL_COMMUNITY): Payer: Self-pay | Admitting: *Deleted

## 2019-06-12 DIAGNOSIS — I5032 Chronic diastolic (congestive) heart failure: Secondary | ICD-10-CM

## 2019-06-12 MED ORDER — SODIUM CHLORIDE 0.9% FLUSH: 3.0000 mL | Freq: Two times a day (BID) | INTRAVENOUS | Status: DC

## 2019-06-12 NOTE — Telephone Encounter (Signed)
Spoke w/pt's niece [name], she is aware of cath time and instructions, copy also sent via mychart.  She will take pt for Covid testing tomorrow.

## 2019-06-12 NOTE — Telephone Encounter (Signed)
Spoke w/pt, she is aware and agreeable w/plan, she ask that I call her niece [name] to sch time and covid testing.  RHC sch for 3/2 at 12pm, cath lab is aware pt will be admitted after  Attempted to call pt's niece [name] to discuss, she is currently unavailable but will call me back.

## 2019-06-12 NOTE — Telephone Encounter (Signed)
-----  Message from Jolaine Artist, MD sent at 06/11/2019 11:50 PM EST ----- Regarding: Admission  Hey. We need to arrange Cath for placement of swan followed by admission to icu on 3/2. Can you help me what that in am?  She is having surgery on 3/5

## 2019-06-13 ENCOUNTER — Other Ambulatory Visit (HOSPITAL_COMMUNITY)
Admission: RE | Admit: 2019-06-13 | Discharge: 2019-06-13 | Disposition: A | Discharge status: Home / Self Care | Payer: PPO | Source: Ambulatory Visit | Attending: Internal Medicine | Admitting: Internal Medicine

## 2019-06-13 DIAGNOSIS — Z01812 Encounter for preprocedural laboratory examination: Secondary | ICD-10-CM | POA: Diagnosis not present

## 2019-06-13 DIAGNOSIS — Z20822 Contact with and (suspected) exposure to covid-19: Secondary | ICD-10-CM | POA: Diagnosis not present

## 2019-06-13 LAB — SARS CORONAVIRUS 2 (TAT 6-24 HRS): SARS Coronavirus 2: NEGATIVE

## 2019-06-16 ENCOUNTER — Ambulatory Visit (HOSPITAL_BASED_OUTPATIENT_CLINIC_OR_DEPARTMENT_OTHER)
Admission: RE | Admit: 2019-06-16 | Discharge: 2019-06-16 | Disposition: A | Discharge status: Home / Self Care | Payer: PPO | Source: Home / Self Care | Attending: Internal Medicine | Admitting: Internal Medicine

## 2019-06-16 ENCOUNTER — Encounter (HOSPITAL_COMMUNITY)
Admission: RE | Disposition: A | Discharge status: Home / Self Care | Payer: Self-pay | Source: Home / Self Care | Attending: Internal Medicine

## 2019-06-16 ENCOUNTER — Other Ambulatory Visit: Payer: Self-pay

## 2019-06-16 DIAGNOSIS — E1151 Type 2 diabetes mellitus with diabetic peripheral angiopathy without gangrene: Secondary | ICD-10-CM | POA: Diagnosis present

## 2019-06-16 DIAGNOSIS — Z823 Family history of stroke: Secondary | ICD-10-CM | POA: Diagnosis not present

## 2019-06-16 DIAGNOSIS — K219 Gastro-esophageal reflux disease without esophagitis: Secondary | ICD-10-CM | POA: Diagnosis present

## 2019-06-16 DIAGNOSIS — J449 Chronic obstructive pulmonary disease, unspecified: Secondary | ICD-10-CM | POA: Diagnosis present

## 2019-06-16 DIAGNOSIS — I708 Atherosclerosis of other arteries: Secondary | ICD-10-CM | POA: Diagnosis not present

## 2019-06-16 DIAGNOSIS — Z87891 Personal history of nicotine dependence: Secondary | ICD-10-CM | POA: Diagnosis not present

## 2019-06-16 DIAGNOSIS — E785 Hyperlipidemia, unspecified: Secondary | ICD-10-CM | POA: Diagnosis present

## 2019-06-16 DIAGNOSIS — Z7984 Long term (current) use of oral hypoglycemic drugs: Secondary | ICD-10-CM | POA: Diagnosis not present

## 2019-06-16 DIAGNOSIS — Z79899 Other long term (current) drug therapy: Secondary | ICD-10-CM | POA: Diagnosis not present

## 2019-06-16 DIAGNOSIS — I200 Unstable angina: Secondary | ICD-10-CM | POA: Diagnosis not present

## 2019-06-16 DIAGNOSIS — I6523 Occlusion and stenosis of bilateral carotid arteries: Secondary | ICD-10-CM | POA: Insufficient documentation

## 2019-06-16 DIAGNOSIS — I2510 Atherosclerotic heart disease of native coronary artery without angina pectoris: Secondary | ICD-10-CM | POA: Diagnosis present

## 2019-06-16 DIAGNOSIS — I63231 Cerebral infarction due to unspecified occlusion or stenosis of right carotid arteries: Secondary | ICD-10-CM | POA: Diagnosis not present

## 2019-06-16 DIAGNOSIS — Z8673 Personal history of transient ischemic attack (TIA), and cerebral infarction without residual deficits: Secondary | ICD-10-CM | POA: Insufficient documentation

## 2019-06-16 DIAGNOSIS — Z8249 Family history of ischemic heart disease and other diseases of the circulatory system: Secondary | ICD-10-CM | POA: Diagnosis not present

## 2019-06-16 DIAGNOSIS — I6521 Occlusion and stenosis of right carotid artery: Secondary | ICD-10-CM | POA: Diagnosis not present

## 2019-06-16 DIAGNOSIS — Z7951 Long term (current) use of inhaled steroids: Secondary | ICD-10-CM | POA: Diagnosis not present

## 2019-06-16 DIAGNOSIS — I2721 Secondary pulmonary arterial hypertension: Secondary | ICD-10-CM | POA: Diagnosis present

## 2019-06-16 DIAGNOSIS — J9611 Chronic respiratory failure with hypoxia: Secondary | ICD-10-CM | POA: Diagnosis present

## 2019-06-16 DIAGNOSIS — Z902 Acquired absence of lung [part of]: Secondary | ICD-10-CM | POA: Insufficient documentation

## 2019-06-16 DIAGNOSIS — R42 Dizziness and giddiness: Secondary | ICD-10-CM | POA: Insufficient documentation

## 2019-06-16 DIAGNOSIS — I5032 Chronic diastolic (congestive) heart failure: Secondary | ICD-10-CM | POA: Insufficient documentation

## 2019-06-16 DIAGNOSIS — E669 Obesity, unspecified: Secondary | ICD-10-CM | POA: Insufficient documentation

## 2019-06-16 DIAGNOSIS — Z7982 Long term (current) use of aspirin: Secondary | ICD-10-CM | POA: Diagnosis not present

## 2019-06-16 DIAGNOSIS — I110 Hypertensive heart disease with heart failure: Secondary | ICD-10-CM | POA: Diagnosis present

## 2019-06-16 DIAGNOSIS — G459 Transient cerebral ischemic attack, unspecified: Secondary | ICD-10-CM | POA: Diagnosis not present

## 2019-06-16 DIAGNOSIS — E7800 Pure hypercholesterolemia, unspecified: Secondary | ICD-10-CM | POA: Diagnosis present

## 2019-06-16 DIAGNOSIS — Z85118 Personal history of other malignant neoplasm of bronchus and lung: Secondary | ICD-10-CM | POA: Diagnosis not present

## 2019-06-16 DIAGNOSIS — I5043 Acute on chronic combined systolic (congestive) and diastolic (congestive) heart failure: Secondary | ICD-10-CM | POA: Diagnosis present

## 2019-06-16 DIAGNOSIS — I771 Stricture of artery: Secondary | ICD-10-CM | POA: Diagnosis present

## 2019-06-16 DIAGNOSIS — Z7902 Long term (current) use of antithrombotics/antiplatelets: Secondary | ICD-10-CM | POA: Diagnosis not present

## 2019-06-16 DIAGNOSIS — Z20822 Contact with and (suspected) exposure to covid-19: Secondary | ICD-10-CM | POA: Diagnosis present

## 2019-06-16 DIAGNOSIS — G450 Vertebro-basilar artery syndrome: Secondary | ICD-10-CM | POA: Diagnosis present

## 2019-06-16 DIAGNOSIS — F419 Anxiety disorder, unspecified: Secondary | ICD-10-CM | POA: Diagnosis present

## 2019-06-16 DIAGNOSIS — Z9981 Dependence on supplemental oxygen: Secondary | ICD-10-CM | POA: Diagnosis not present

## 2019-06-16 HISTORY — PX: RIGHT HEART CATH: CATH118263

## 2019-06-16 LAB — BASIC METABOLIC PANEL
Anion gap: 11 (ref 5–15)
BUN: 25 mg/dL — ABNORMAL HIGH (ref 8–23)
CO2: 29 mmol/L (ref 22–32)
Calcium: 9.4 mg/dL (ref 8.9–10.3)
Chloride: 96 mmol/L — ABNORMAL LOW (ref 98–111)
Creatinine, Ser: 1.06 mg/dL — ABNORMAL HIGH (ref 0.44–1.00)
GFR calc Af Amer: 58 mL/min — ABNORMAL LOW (ref >60)
GFR calc non Af Amer: 50 mL/min — ABNORMAL LOW (ref >60)
Glucose, Bld: 201 mg/dL — ABNORMAL HIGH (ref 70–99)
Potassium: 4.3 mmol/L (ref 3.5–5.1)
Sodium: 136 mmol/L (ref 135–145)

## 2019-06-16 LAB — CBC
HCT: 38.3 % (ref 36.0–46.0)
Hemoglobin: 11.8 g/dL — ABNORMAL LOW (ref 12.0–15.0)
MCH: 26.5 pg (ref 26.0–34.0)
MCHC: 30.8 g/dL (ref 30.0–36.0)
MCV: 85.9 fL (ref 80.0–100.0)
Platelets: 328 10*3/uL (ref 150–400)
RBC: 4.46 MIL/uL (ref 3.87–5.11)
RDW: 13 % (ref 11.5–15.5)
WBC: 7.1 10*3/uL (ref 4.0–10.5)
nRBC: 0 % (ref 0.0–0.2)

## 2019-06-16 LAB — POCT I-STAT EG7
Acid-Base Excess: 6 mmol/L — ABNORMAL HIGH (ref 0.0–2.0)
Acid-Base Excess: 7 mmol/L — ABNORMAL HIGH (ref 0.0–2.0)
Bicarbonate: 33.2 mmol/L — ABNORMAL HIGH (ref 20.0–28.0)
Bicarbonate: 34.6 mmol/L — ABNORMAL HIGH (ref 20.0–28.0)
Calcium, Ion: 1.15 mmol/L (ref 1.15–1.40)
Calcium, Ion: 1.16 mmol/L (ref 1.15–1.40)
HCT: 35 % — ABNORMAL LOW (ref 36.0–46.0)
HCT: 35 % — ABNORMAL LOW (ref 36.0–46.0)
Hemoglobin: 11.9 g/dL — ABNORMAL LOW (ref 12.0–15.0)
Hemoglobin: 11.9 g/dL — ABNORMAL LOW (ref 12.0–15.0)
O2 Saturation: 69 %
O2 Saturation: 71 %
Potassium: 3.5 mmol/L (ref 3.5–5.1)
Potassium: 3.5 mmol/L (ref 3.5–5.1)
Sodium: 141 mmol/L (ref 135–145)
Sodium: 141 mmol/L (ref 135–145)
TCO2: 35 mmol/L — ABNORMAL HIGH (ref 22–32)
TCO2: 37 mmol/L — ABNORMAL HIGH (ref 22–32)
pCO2, Ven: 62.2 mmHg — ABNORMAL HIGH (ref 44.0–60.0)
pCO2, Ven: 64.1 mmHg — ABNORMAL HIGH (ref 44.0–60.0)
pH, Ven: 7.336 (ref 7.250–7.430)
pH, Ven: 7.34 (ref 7.250–7.430)
pO2, Ven: 39 mmHg (ref 32.0–45.0)
pO2, Ven: 41 mmHg (ref 32.0–45.0)

## 2019-06-16 LAB — GLUCOSE, CAPILLARY
Glucose-Capillary: 194 mg/dL — ABNORMAL HIGH (ref 70–99)
Glucose-Capillary: 119 mg/dL — ABNORMAL HIGH (ref 70–99)

## 2019-06-16 SURGERY — RIGHT HEART CATH
Anesthesia: LOCAL

## 2019-06-16 MED ORDER — SODIUM CHLORIDE 0.9% FLUSH: 3.0000 mL | INTRAVENOUS | Status: DC | PRN

## 2019-06-16 MED ORDER — LIDOCAINE HCL (PF) 1 % IJ SOLN
INTRAMUSCULAR | Status: AC
  Filled 2019-06-16: qty 30

## 2019-06-16 MED ORDER — FENTANYL CITRATE (PF) 100 MCG/2ML IJ SOLN
INTRAMUSCULAR | Status: DC | PRN
  Administered 2019-06-16: 25 ug via INTRAVENOUS

## 2019-06-16 MED ORDER — LABETALOL HCL 5 MG/ML IV SOLN: 10.0000 mg | INTRAVENOUS | Status: DC | PRN

## 2019-06-16 MED ORDER — FENTANYL CITRATE (PF) 100 MCG/2ML IJ SOLN
INTRAMUSCULAR | Status: AC
  Filled 2019-06-16: qty 2

## 2019-06-16 MED ORDER — SODIUM CHLORIDE 0.9 % IV SOLN: 250.0000 mL | INTRAVENOUS | Status: DC | PRN

## 2019-06-16 MED ORDER — MIDAZOLAM HCL 2 MG/2ML IJ SOLN
INTRAMUSCULAR | Status: AC
  Filled 2019-06-16: qty 2

## 2019-06-16 MED ORDER — SODIUM CHLORIDE 0.9 % IV SOLN: INTRAVENOUS | Status: DC

## 2019-06-16 MED ORDER — SODIUM CHLORIDE 0.9% FLUSH: 3.0000 mL | Freq: Two times a day (BID) | INTRAVENOUS | Status: DC

## 2019-06-16 MED ORDER — HYDRALAZINE HCL 20 MG/ML IJ SOLN: 10.0000 mg | INTRAMUSCULAR | Status: DC | PRN

## 2019-06-16 MED ORDER — HEPARIN (PORCINE) IN NACL 1000-0.9 UT/500ML-% IV SOLN
INTRAVENOUS | Status: DC | PRN
  Administered 2019-06-16: 500 mL

## 2019-06-16 MED ORDER — ONDANSETRON HCL 4 MG/2ML IJ SOLN: 4.0000 mg | Freq: Four times a day (QID) | INTRAMUSCULAR | Status: DC | PRN

## 2019-06-16 MED ORDER — HEPARIN (PORCINE) IN NACL 1000-0.9 UT/500ML-% IV SOLN
INTRAVENOUS | Status: AC
  Filled 2019-06-16: qty 500

## 2019-06-16 MED ORDER — LIDOCAINE HCL (PF) 1 % IJ SOLN
INTRAMUSCULAR | Status: DC | PRN
  Administered 2019-06-16: 5 mL

## 2019-06-16 MED ORDER — ACETAMINOPHEN 325 MG PO TABS
650.0000 mg | ORAL_TABLET | ORAL | Status: DC | PRN
  Administered 2019-06-16: 650 mg via ORAL
  Filled 2019-06-16: qty 2

## 2019-06-16 MED ORDER — MIDAZOLAM HCL 2 MG/2ML IJ SOLN
INTRAMUSCULAR | Status: DC | PRN
  Administered 2019-06-16: 1 mg via INTRAVENOUS

## 2019-06-16 SURGICAL SUPPLY — 8 items
CATH SWAN GANZ 7F STRAIGHT (CATHETERS) ×1 IMPLANT
PACK CARDIAC CATHETERIZATION (CUSTOM PROCEDURE TRAY) ×2 IMPLANT
SHEATH PINNACLE 7F 10CM (SHEATH) ×1 IMPLANT
SHEATH PROBE COVER 6X72 (BAG) ×1 IMPLANT
SLEEVE REPOSITIONING LENGTH 30 (MISCELLANEOUS) ×1 IMPLANT
TRANSDUCER W/STOPCOCK (MISCELLANEOUS) ×2 IMPLANT
TUBING ART PRESS 72  MALE/FEM (TUBING) ×2
TUBING ART PRESS 72 MALE/FEM (TUBING) IMPLANT

## 2019-06-16 NOTE — Interval H&P Note (Signed)
History and Physical Interval Note:  06/16/2019 1:53 PM  Caitlin Oliver  has presented today for surgery, with the diagnosis of Heart failure.  The various methods of treatment have been discussed with the patient and family. After consideration of risks, benefits and other options for treatment, the patient has consented to  Procedure(s): RIGHT HEART CATH (N/A) and swan ganz placement as a surgical intervention.  The patient's history has been reviewed, patient examined, no change in status, stable for surgery.  I have reviewed the patient's chart and labs.  Questions were answered to the patient's satisfaction.     Israel Werts

## 2019-06-16 NOTE — Progress Notes (Signed)
Site area: rt IJ venous sheath Site Prior to Removal:  Level 0 Pressure Applied For: 10 minutes Manual:   yes Patient Status During Pull:  stable Post Pull Site:  Level 0 Post Pull Instructions Given:  yes Post Pull Pulses Present: NA Dressing Applied:  Petroleum gauze, gauze and tegaderm Bedrest begins @  Comments:

## 2019-06-16 NOTE — Discharge Instructions (Signed)
RIGHT HEART A venogram, or venography, is a procedure that uses an X-ray and dye (contrast) to examine how well the veins work and how blood flows through them. Contrast helps the veins show up on X-rays. A venogram may be done:  To evaluate abnormalities in the vein.  To identify clots within veins, such as deep vein thrombosis (DVT).  To map out the veins that might be needed for another procedure. Tell a health care provider about:  Any allergies you have, especially to medicines, shellfish, iodine, and contrast.  All medicines you are taking, including vitamins, herbs, eye drops, creams, and over-the-counter medicines.  Any problems you or family members have had with anesthetic medicines.  Any blood disorders you have.  Any surgeries you have had and any complications that occurred.  Any medical conditions you have.  Whether you are pregnant, may be pregnant, or are breastfeeding.  Any history of smoking or tobacco use. What are the risks? Generally, this is a safe procedure. However, problems may occur, including:  Infection.  Bleeding.  Blood clots.  Allergic reaction to medicines or contrast.  Damage to other structures or organs.  Kidney problems.  Increased risk of cancer. Being exposed to too much radiation over a lifetime can increase the risk of cancer. The risk is small. What happens before the procedure? Medicines Ask your health care provider about:  Changing or stopping your regular medicines. This is especially important if you are taking diabetes medicines or blood thinners.  Taking medicines such as aspirin and ibuprofen. These medicines can thin your blood. Do not take these medicines unless your health care provider tells you to take them.  Taking over-the-counter medicines, vitamins, herbs, and supplements. General instructions  Follow instructions from your health care provider about eating or drinking restrictions.  You may have blood  tests to check how well your kidneys and liver are working and how well your blood can clot.  Plan to have someone take you home from the hospital or clinic. What happens during the procedure?   An IV will be inserted into one of your veins.  You may be given a medicine to help you relax (sedative).  You will lie down on an X-ray table. The table may be tilted in different directions during the procedure to help the contrast move throughout your body. Safety straps will keep you secure if the table is tilted.  If veins in your arm or leg will be examined, a band may be wrapped around that arm or leg to keep the veins full of blood. This may cause your arm or leg to feel numb.  The contrast will be injected into your IV. You may have a hot, flushed feeling as it moves throughout your body. You may also have a metallic taste in your mouth. Both of these sensations will go away after the test is complete.  You may be asked to lie in different positions or place your legs or arms in different positions.  At the end of the procedure, you may be given IV fluids to help wash or flush the contrast out of your veins.  The IV will be removed, and pressure will be applied to the IV site to prevent bleeding. A bandage (dressing) may be applied to the IV site. The exact procedure may vary among health care providers and hospitals. What can I expect after the procedure?  Your blood pressure, heart rate, breathing rate, and blood oxygen level will be monitored until  you leave the hospital or clinic.  You may be given something to eat and drink.  You may have bruising or mild discomfort in the area where the IV was inserted. Follow these instructions at home: Eating and drinking   Follow instructions from your health care provider about eating or drinking restrictions.  Drink a lot of water for the first several days after the procedure, as directed by your health care provider. This helps to  flush the contrast out of your body. Activity  Rest as told by your health care provider.  Return to your normal activities as told by your health care provider. Ask your health care provider what activities are safe for you.  If you were given a sedative during your procedure, do not drive for 24 hours or until your health care provider approves. General instructions  Check your IV insertion area every day for signs of infection. Check for: ? Redness, swelling, or pain. ? Fluid or blood. ? Warmth. ? Pus or a bad smell.  Take over-the-counter and prescription medicines only as told by your health care provider.  Keep all follow-up visits as told by your health care provider. This is important. Contact a health care provider if:  Your skin becomes itchy or you develop a rash or hives.  You have a fever that does not get better with medicine.  You feel nauseous or you vomit.  You have redness, swelling, or pain around the insertion site.  You have fluid or blood coming from the insertion site.  Your insertion area feels warm to the touch.  You have pus or a bad smell coming from the insertion site. Get help right away if you:  Have shortness of breath or difficulty breathing.  Develop chest pain.  Faint.  Feel very dizzy. These symptoms may represent a serious problem that is an emergency. Do not wait to see if the symptoms will go away. Get medical help right away. Call your local emergency services (911 in the [place] Do not drive yourself to the hospital. Summary  A venogram, or venography, is a procedure that uses an X-ray and contrast dye to check how well the veins work and how blood flows through them.  An IV will be inserted into one of your veins in order to inject the contrast.  During the exam, you will lie on an X-ray table. The table may be tilted in different directions during the procedure to help the contrast move throughout your body. Safety straps will  keep you secure.  After the procedure, you will need to drink a lot of water to help wash or flush the contrast out of your body. This information is not intended to replace advice given to you by your health care provider. Make sure you discuss any questions you have with your health care provider. Document Revised: 11/08/2018 Document Reviewed: 11/08/2018 Elsevier Patient Education  [place].

## 2019-06-18 ENCOUNTER — Other Ambulatory Visit (HOSPITAL_COMMUNITY)
Admission: RE | Admit: 2019-06-18 | Discharge: 2019-06-18 | Disposition: A | Discharge status: Home / Self Care | Payer: PPO | Source: Ambulatory Visit | Attending: Vascular Surgery | Admitting: Vascular Surgery

## 2019-06-18 ENCOUNTER — Encounter (HOSPITAL_COMMUNITY): Payer: Self-pay | Admitting: Vascular Surgery

## 2019-06-18 ENCOUNTER — Other Ambulatory Visit: Payer: Self-pay

## 2019-06-18 LAB — SARS CORONAVIRUS 2 (TAT 6-24 HRS): SARS Coronavirus 2: NEGATIVE

## 2019-06-18 NOTE — Anesthesia Preprocedure Evaluation (Addendum)
Anesthesia Evaluation  Patient identified by MRN, date of birth, ID band Patient awake    Reviewed: Allergy & Precautions, NPO status , Patient's Chart, lab work & pertinent test results  Airway Mallampati: I  TM Distance: >3 FB Neck ROM: Full    Dental   Pulmonary former smoker,  H/O R upper lobe resection for lung CA   Pulmonary exam normal        Cardiovascular hypertension, Pt. on medications +CHF  Normal cardiovascular exam  RHC 06/16/19: Findings: RA = 9 RV = 54/10 PA =  50/15 (30) PCW = 17 Fick cardiac output/index = 5.1/2.8 Thermo CO/CI = 3.9/2.1 PVR = 2.5 WU (Fick) 3.3 WU (Thermo) FA sat = 99% PA sat = 69%, 66% Assessment: 1. Mild PAH  2. Markedly improved left-sided pressures Plan/Discussion: Ok to proceed with surgery.  Glori Bickers, MD    Neuro/Psych Anxiety CVA    GI/Hepatic GERD  Medicated and Controlled,  Endo/Other  diabetes, Type 2, Oral Hypoglycemic Agents  Renal/GU      Musculoskeletal   Abdominal   Peds  Hematology   Anesthesia Other Findings   Reproductive/Obstetrics                            Anesthesia Physical Anesthesia Plan  ASA: III  Anesthesia Plan: General   Post-op Pain Management:    Induction:   PONV Risk Score and Plan: 3 and Ondansetron, Midazolam and Treatment may vary due to age or medical condition  Airway Management Planned: Oral ETT  Additional Equipment: Arterial line  Intra-op Plan:   Post-operative Plan: Extubation in OR  Informed Consent: I have reviewed the patients History and Physical, chart, labs and discussed the procedure including the risks, benefits and alternatives for the proposed anesthesia with the patient or authorized representative who has indicated his/her understanding and acceptance.     Dental advisory given  Plan Discussed with: CRNA and Surgeon  Anesthesia Plan Comments: (See PAT note written  06/18/2019 by Myra Gianotti, PA-C. )      Anesthesia Quick Evaluation

## 2019-06-18 NOTE — Progress Notes (Signed)
Pt denies any acute pulmonary issues. Pt denies chest pain. Pt stated that she is under the care of Dr. Haroldine Laws, Cardiology and Bing Matter, [place], PCP. Pt stated that last dose of Plavix was today as instructed by surgeon. Pt made aware to stop taking vitamins, fish oil and herbal medications. Do not take any NSAIDs ie: Ibuprofen, Advil, Naproxen (Aleve), Motrin, BC and Goody Powder.Pt made aware to hold Metformin on DOS. Pt made aware to check CBG every 2 hours prior to arrival to hospital on DOS. Pt made aware to treat a CBG < 70 with 4 ounces of apple or cranberry juice, wait 15 minutes after intervention to recheck CBG, if CBG remains < 70, call Short Stay unit to speak with a nurse. Pt made aware to continue to quarantine. Pt verbalized understanding of all pre-op instructions. PA, Anesthesiology, asked to review pt history.

## 2019-06-18 NOTE — Progress Notes (Signed)
Anesthesia Chart Review:  Case: [id number] Date/Time: 06/19/19 0715   Procedure: INNOMINATE STENT, RIGHT CAROTID STENT, RIGHT SUBCLAVIAN STENT AND Possible,CAROTID CUTDOWN (Right )   Anesthesia type: General   Pre-op diagnosis: INNOMINATE ARTERY STENOSIS, SUBCLAVIAN ARTERY STENOSIS   Location: [facility] OR ROOM 16 / [place] OR   Surgeons: Marty Heck, MD      DISCUSSION: Patient is a 79 year old female scheduled for the above procedure.  History includes former smoker (quit 2015), HTN, hypercholesterolemia, COPD, lung cancer (stage 1A non-small cell lung cancer, s/p LU lobectomy 02/03/16), chronic respiratory failure (on 2L home O2), chronic diastolic CHF, pulmonary hypertension (mixed presentation, PAH/PVH), GERD, anxiety, DM2, PAD (s/p bilateral CIA stents 07/27/13), carotid artery stenosis (s/p carotid artery endarterectomies, right 03/06/13, left 05/07/13), left SCA stenosis (s/p left SCA stent 11/15/15), CVA (06/16/18), GI bleed (~ 2016 while on ASA/Plavix), thyroid nodules (benign FNA 12/01/15), headaches (s/p occipital nerve blocks)  She has been having persistent dizziness and nausea, worsening ~ 02/2019. Multiple imaging studies as outlined below. Findings and symptoms concerning for posterior circulation ischemia with multiple stenosis involving major branch vessels. Vascular neurologist Dr. Leonie Man recommended referral to vascular surgery. She was seen by Dr. Donnetta Hutching. Given her multiple co-morbidities, he did not feel she could tolerate sternotomy and direct arch replacement or carotid exposure, so he discussed option of direct exposure of her carotid and brachial artery with retrograde stenting of her innominate artery, and possibly right CCA and SCA takeoffs with his partner Dr. Carlis Abbott.   Dr. Donnetta Hutching referred her back to Dr. Haroldine Laws to optimize prior to surgery. He saw her on 06/10/19 and wrote, "with obesity, COPD and PAH she is at high  (but not prohibitive) risk for peri-operative CV complications..."  He initially recommended admission several days before surgery for RHC and leave [place] in to optimize hemodynamic state, but she was discharged home following 06/16/19 RHC when findings showed mild PAH with markedly improved left-sided pressures since 11/2018. Per Dr. Haroldine Laws, "Ok to proceed with surgery." Per his 06/10/19 office note, "She agrees with intubation for surgery but would not want long-term trach or vent support if unable to wean post-op".  06/18/19 presurgical COVID-19 test is in process. Anesthesia team to evaluate prior to surgery.   VS:   Wt Readings from Last 3 Encounters:  06/16/19 77.8 kg  06/10/19 79.1 kg  06/09/19 78.5 kg   BP Readings from Last 3 Encounters:  06/16/19 (!) 117/58  06/10/19 (!) 122/58  06/09/19 132/69   Pulse Readings from Last 3 Encounters:  06/16/19 72  06/10/19 90  06/09/19 86    PROVIDERS: [name], PA-C is PCP ([place], see [place]) - Quay Burow, MD is [facility] cardiologist. Last evaluation 10/03/18.  Haroldine Laws, Daniel, MD is HF cardiologist. She has refused Pulmonary Rehab in the past due to distance to travel and is not a candidate for selective pulmonary artery vasodilators. She has not yet undergone sleep study that was ordered last Fall.   Christinia Gully, MD is pulmonologist - Curt Jews, MD is primary vascular surgeon. Previously, she saw Brabham, V. Rock Nephew, MD.  - Modesto Charon, MD is CT surgeon  - Neurologist is with [facility] (She has seen Rosalin Hawking, MD; Marcial Pacas, MD, Antony Contras, MD and Butler Denmark, NP within the past few years)  - Curt Bears, MD is HEM-ONC. Last visit 03/10/18.    LABS: Labs as of 06/16/19 include: Lab Results  Component Value Date  WBC 7.1 06/16/2019   HGB 11.9 (L) 06/16/2019   HCT 35.0 (L) 06/16/2019   PLT 328 06/16/2019   GLUCOSE 201 (H) 06/16/2019   CHOL 99 06/17/2018   TRIG 122 06/17/2018   HDL 31 (L) 06/17/2018    LDLCALC 44 06/17/2018   ALT 16 01/27/2019   AST 23 01/27/2019   NA 141 06/16/2019   K 3.5 06/16/2019   CL 96 (L) 06/16/2019   CREATININE 1.06 (H) 06/16/2019   BUN 25 (H) 06/16/2019   CO2 29 06/16/2019   TSH 1.620 06/17/2018   INR 0.9 05/14/2019   HGBA1C 7.9 (H) 06/17/2018    OTHER: CTA Neck 06/08/99: IMPRESSION: 1. Prominent predominantly calcified plaque within the visualized aortic arch and major branch vessels of the neck with multifocal stenoses most notably as follows. 2. Severe, near occlusive stenosis of the innominate artery. 3. Moderate/severe stenosis at the origin of the right subclavian artery. 4. Severe stenosis at the origin of the right common carotid artery. Mixed plaque within the mid right CCA with less than 50% stenosis. Prominent calcified plaque within the distal cervical ICA with estimated 60-70% stenosis, although this stenosis may be overestimated due to blooming from calcified plaque. 5. Moderate/severe stenosis at the origin of the left common carotid artery. Prominent calcified plaque within the mid left CCA with estimated 50-60% stenosis. 6. The vertebral arteries are patent within the neck bilaterally without stenosis. However, retrograde flow was demonstrated within the right vertebral artery on prior catheter based angiography and MR angiography. 7. Patent stent within the proximal left subclavian artery without significant intrastent stenosis. 8. Enlarged multinodular thyroid gland. A dominant nodule within the left lobe measures 3.2 cm. This does not clearly reflect the isthmic nodule biopsied in August 2017, and nonemergent thyroid ultrasound is recommended for further evaluation.  Bilateral angio vertebral selective subclavian innominate 05/14/19: IMPRESSION:  - Severe pre occlusive stenosis of the innominate artery with  antegrade flow noted in the right common carotid artery.  - Angiographically occluded right subclavian artery proximal  to the  origin of the right vertebral artery.  - A right subclavian steal phenomenon from the left vertebral artery  injection.  - Partial reconstitution of the right internal carotid artery  supraclinoid segment and right middle cerebral artery via the right  posterior communicating artery from the left vertebral artery  injection.  - Mild-to-moderate intra stent narrowing of the left subclavian stent  secondary to neointimal hyperplasia.   PFTs 02/19/19: FEV1 1.25 (62%) FVC 2.03 (75%) DLCO 51%  1V CXR 11/19/18: FINDINGS: Stable cardiomediastinal contours. Calcific aortic knob. Small bilateral pleural effusions, stable on the left, decreased on the right. Mild streaky left basilar opacity. No pneumothorax. IMPRESSION: 1. Small bilateral pleural effusions, slightly decreased on the right. 2. Mild streaky left basilar opacity suggesting atelectasis.   EKG: 06/16/19: NSR   CV: RHC 06/16/19: Findings: RA = 9 RV = 54/10 PA =  50/15 (30) PCW = 17 Fick cardiac output/index = 5.1/2.8 Thermo CO/CI = 3.9/2.1 PVR = 2.5 WU (Fick) 3.3 WU (Thermo) FA sat = 99% PA sat = 69%, 66% Assessment: 1. Mild PAH  2. Markedly improved left-sided pressures Plan/Discussion: Ok to proceed with surgery.  Glori Bickers, MD  4:08 PM   RHC/LHC 11/20/18: RA 11 PAP 79/39 (52)  TPG 21  PCWP 31 PVR 4.9  CO 4.3  CI 2.9  Conclusions: 1. Mild to moderate, non-obstructive coronary artery disease (15% mid LAD, 40% distal RCA). 2. Severe pulmonary hypertension. 3. Moderately  to severely elevated left and right heart filling pressures. 4. Mildly reduced Fick cardiac output/index. 5. Severe right brachiocephalic and subclavian artery stenoses with combined gradient of ~50 mmHg and possible reversal of flow in the right vertebral artery (subclavian steal). Recommendations: 1. Medical therapy and risk factor modification to prevent progression of coronary artery disease. 2. Continue gentle diuresis,  as blood pressure tolerates.  Consultation of advanced heart failure team may be helpful. 3. Continue outpatient vascular follow-up, including monitoring of significant right brachiocephalic/subclavian artery stenoses.   Echo 11/14/18: IMPRESSIONS  1. The left ventricle has hyperdynamic systolic function, with an  ejection fraction of >65%. The cavity size was normal. There is moderate  asymmetric left ventricular hypertrophy of the septal wall. Left  ventricular diastolic Doppler parameters are  indeterminate.  2. The right ventricle has mildly reduced systolic function. The cavity  was severely enlarged. There is no increase in right ventricular wall  thickness. Right ventricular systolic pressure is severely elevated with  an estimated pressure of 65.8 mmHg.  3. Small pericardial effusion.  4. No stenosis of the aortic valve.  5. The aorta is normal in size and structure.  6. The aortic root is normal in size and structure.  7. The inferior vena cava was dilated in size with >50% respiratory  variability.  SUMMARY  Severe pulmonary hypertension with small pericardial effusion, no  evidence of tamponade. RV limited views but appears severely  enlarged with echo contrast (definity).    Carotid US 07/21/18: Summary:  - Right Carotid: Velocities in the right ICA are consistent with a 1-39% stenosis. Non-hemodynamically significant plaque <50% noted in the CCA. The ECA appears <50% stenosed.  - Left Carotid: Velocities in the left ICA are consistent with a 1-39% stenosis. Focal color aliasing may suggest ulcerated plaque in the left midcommon carotid artery. Plaque visualized is noted as calcific andirregular.  - Vertebrals: Left vertebral artery demonstrates antegrade flow. Right  vertebral artery demonstrates retrograde flow.  - Subclavians: Normal flow hemodynamics were seen in bilateral subclavian arteries.    Past Medical History:  Diagnosis Date  . Anxiety   .  Arthritis   . Cancer ([place])    lung cancer  . Carotid artery disease ([place])   . Cataracts, bilateral   . CHF (congestive heart failure) ([place])   . Claudication ([place])   . COPD (chronic obstructive pulmonary disease) ([place])   . Diabetes mellitus   . Dizzy   . Family history of adverse reaction to anesthesia    Pt nephew has PONV  . GERD (gastroesophageal reflux disease)   . Headache   . History of kidney stones   . Hypercholesteremia   . Hypertension   . 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 Pneumonia   . Stomach ulcer   . Stroke ([place])   . Tobacco abuse   . Wears glasses     Past Surgical History:  Procedure Laterality Date  . ABDOMINAL HYSTERECTOMY    . APPENDECTOMY    . BREAST REDUCTION SURGERY    . CAROTID ANGIOGRAM N/A 02/25/2013   Procedure: CAROTID ANGIOGRAM;  Surgeon: Lorretta Harp, MD;  Location[facility] CATH LAB;  Service: Cardiovascular;  Laterality: N/A;  . ENDARTERECTOMY Right 03/06/2013   Procedure: ENDARTERECTOMY CAROTID-RIGHT;  Surgeon: Serafina Mitchell, MD;  Location: [place];  Service: Vascular;  Laterality: Right;  . ENDARTERECTOMY Left 05/07/2013   Procedure: LEFT CAROTID ARTERY ENDARTERECTOMY WITH VASCU-GUARD PATCH ANGIOPLASTY ;  Surgeon: Serafina Mitchell, MD;  Location: [facility] OR;  Service: Vascular;  Laterality: Left;  . IR ANGIO INTRA EXTRACRAN SEL COM CAROTID INNOMINATE UNI R MOD SED  05/14/2019  . IR ANGIO VERTEBRAL SEL SUBCLAVIAN INNOMINATE BILAT MOD SED  05/14/2019  . LOBECTOMY Left 02/03/2016   Procedure: LEFT UPPER LOBECTOMY;  Surgeon: [name], MD;  Location: [place];  Service: Thoracic;  Laterality: Left;  . LOWER EXTREMITY ANGIOGRAM N/A 02/25/2013   Procedure: LOWER EXTREMITY ANGIOGRAM;  Surgeon: Lorretta Harp, MD;  Location: [facility] CATH LAB;  Service: Cardiovascular;  Laterality: N/A;  . LOWER EXTREMITY ANGIOGRAM N/A 07/27/2013   Procedure: LOWER EXTREMITY ANGIOGRAM;  Surgeon: Lorretta Harp, MD;   Location: [facility] CATH LAB;  Service: Cardiovascular;  Laterality: N/A;  . PATCH ANGIOPLASTY Right 03/06/2013   Procedure: PATCH ANGIOPLASTY of Right Carotid Artery using Vascu-Guard Patch;  Surgeon: Serafina Mitchell, MD;  Location: [place];  Service: Vascular;  Laterality: Right;  . PERIPHERAL VASCULAR CATHETERIZATION N/A 11/15/2015   Procedure: Aortic Arch Angiography;  Surgeon: Serafina Mitchell, MD;  Location: [place] CV LAB;  Service: Cardiovascular;  Laterality: N/A;  . PERIPHERAL VASCULAR CATHETERIZATION Bilateral 11/15/2015   Procedure: Carotid Angiography;  Surgeon: Serafina Mitchell, MD;  Location: [place] CV LAB;  Service: Cardiovascular;  Laterality: Bilateral;  . PERIPHERAL VASCULAR CATHETERIZATION Left 11/15/2015   Procedure: Upper Extremity Angiography;  Surgeon: Serafina Mitchell, MD;  Location: [place] CV LAB;  Service: Cardiovascular;  Laterality: Left;  . PERIPHERAL VASCULAR CATHETERIZATION Left 11/15/2015   Procedure: Peripheral Vascular Intervention;  Surgeon: Serafina Mitchell, MD;  Location: [place] CV LAB;  Service: Cardiovascular;  Laterality: Left;  subclavian   . RIGHT HEART CATH N/A 06/16/2019   Procedure: RIGHT HEART CATH;  Surgeon: Jolaine Artist, MD;  Location: [place] CV LAB;  Service: Cardiovascular;  Laterality: N/A;  . RIGHT/LEFT HEART CATH AND CORONARY ANGIOGRAPHY N/A 11/20/2018   Procedure: RIGHT/LEFT HEART CATH AND CORONARY ANGIOGRAPHY;  Surgeon: Nelva Bush, MD;  Location: [place] CV LAB;  Service: Cardiovascular;  Laterality: N/A;  . SALIVARY GLAND SURGERY     scar tissue removed from left saliva glad  . TUBAL LIGATION    . VIDEO ASSISTED THORACOSCOPY (VATS)/WEDGE RESECTION Left 02/03/2016   Procedure: VIDEO ASSISTED THORACOSCOPY;  Surgeon: [name], MD;  Location: [place];  Service: Thoracic;  Laterality: Left;    MEDICATIONS: No current facility-administered medications for this encounter.   [name] albuterol (PROVENTIL HFA;VENTOLIN HFA)  108 (90 Base) MCG/ACT inhaler  . albuterol (PROVENTIL) (2.5 MG/3ML) 0.083% nebulizer solution  . aspirin 81 MG chewable tablet  . calcium carbonate (CALCIUM 600) 600 MG TABS tablet  . clopidogrel (PLAVIX) 75 MG tablet  . Cyanocobalamin (VITAMIN B-12) 2500 MCG SUBL  . gabapentin (NEURONTIN) 100 MG capsule  . HYDROcodone-acetaminophen (NORCO/VICODIN) 5-325 MG tablet  . Magnesium 250 MG TABS  . meclizine (ANTIVERT) 12.5 MG tablet  . Menthol, Topical Analgesic, (ICY HOT EX)  . metFORMIN (GLUCOPHAGE) 500 MG tablet  . metoprolol tartrate (LOPRESSOR) 50 MG tablet  . Omega-3 Fatty Acids (FISH OIL) 1200 MG CAPS  . ondansetron (ZOFRAN) 4 MG tablet  . pantoprazole (PROTONIX) 40 MG tablet  . polyethylene glycol (MIRALAX / GLYCOLAX) 17 g packet  . potassium chloride SA (KLOR-CON) 20 MEQ tablet  . pravastatin (PRAVACHOL) 40 MG tablet  . torsemide (DEMADEX) 20 MG tablet  . traZODone (DESYREL) 50 MG tablet  . ALPRAZolam (XANAX) 0.5 MG tablet  .  budesonide (PULMICORT) 0.25 MG/2ML nebulizer solution  . ONE TOUCH ULTRA TEST test strip    Myra Gianotti, PA-C Surgical Short Stay/Anesthesiology [facility] Phone [phone] [facility] Phone [phone] 06/18/2019 4:28 PM

## 2019-06-19 ENCOUNTER — Other Ambulatory Visit: Payer: Self-pay

## 2019-06-19 ENCOUNTER — Encounter (HOSPITAL_COMMUNITY)
Admission: RE | Disposition: A | Discharge status: Home / Self Care | Payer: Self-pay | Source: Home / Self Care | Attending: Vascular Surgery

## 2019-06-19 ENCOUNTER — Inpatient Hospital Stay (HOSPITAL_COMMUNITY): Payer: PPO | Admitting: Anesthesiology

## 2019-06-19 ENCOUNTER — Encounter (HOSPITAL_COMMUNITY): Payer: Self-pay | Admitting: Vascular Surgery

## 2019-06-19 ENCOUNTER — Inpatient Hospital Stay (HOSPITAL_COMMUNITY): Payer: PPO

## 2019-06-19 ENCOUNTER — Inpatient Hospital Stay (HOSPITAL_COMMUNITY)
Admission: RE | Admit: 2019-06-19 | Discharge: 2019-06-21 | DRG: 034 | Disposition: A | Discharge status: Home / Self Care | Payer: PPO | Attending: Vascular Surgery | Admitting: Vascular Surgery

## 2019-06-19 DIAGNOSIS — I5043 Acute on chronic combined systolic (congestive) and diastolic (congestive) heart failure: Secondary | ICD-10-CM | POA: Diagnosis present

## 2019-06-19 DIAGNOSIS — Z79899 Other long term (current) drug therapy: Secondary | ICD-10-CM | POA: Diagnosis not present

## 2019-06-19 DIAGNOSIS — Z85118 Personal history of other malignant neoplasm of bronchus and lung: Secondary | ICD-10-CM

## 2019-06-19 DIAGNOSIS — J9611 Chronic respiratory failure with hypoxia: Secondary | ICD-10-CM | POA: Diagnosis present

## 2019-06-19 DIAGNOSIS — Z7982 Long term (current) use of aspirin: Secondary | ICD-10-CM | POA: Diagnosis not present

## 2019-06-19 DIAGNOSIS — G450 Vertebro-basilar artery syndrome: Principal | ICD-10-CM | POA: Diagnosis present

## 2019-06-19 DIAGNOSIS — Z20822 Contact with and (suspected) exposure to covid-19: Secondary | ICD-10-CM | POA: Diagnosis present

## 2019-06-19 DIAGNOSIS — K219 Gastro-esophageal reflux disease without esophagitis: Secondary | ICD-10-CM | POA: Diagnosis present

## 2019-06-19 DIAGNOSIS — Z7902 Long term (current) use of antithrombotics/antiplatelets: Secondary | ICD-10-CM

## 2019-06-19 DIAGNOSIS — I2721 Secondary pulmonary arterial hypertension: Secondary | ICD-10-CM | POA: Diagnosis present

## 2019-06-19 DIAGNOSIS — Z87891 Personal history of nicotine dependence: Secondary | ICD-10-CM

## 2019-06-19 DIAGNOSIS — I708 Atherosclerosis of other arteries: Secondary | ICD-10-CM | POA: Diagnosis not present

## 2019-06-19 DIAGNOSIS — Z9981 Dependence on supplemental oxygen: Secondary | ICD-10-CM | POA: Diagnosis not present

## 2019-06-19 DIAGNOSIS — E1151 Type 2 diabetes mellitus with diabetic peripheral angiopathy without gangrene: Secondary | ICD-10-CM | POA: Diagnosis present

## 2019-06-19 DIAGNOSIS — Z823 Family history of stroke: Secondary | ICD-10-CM

## 2019-06-19 DIAGNOSIS — I2510 Atherosclerotic heart disease of native coronary artery without angina pectoris: Secondary | ICD-10-CM | POA: Diagnosis present

## 2019-06-19 DIAGNOSIS — I6521 Occlusion and stenosis of right carotid artery: Secondary | ICD-10-CM | POA: Diagnosis not present

## 2019-06-19 DIAGNOSIS — J449 Chronic obstructive pulmonary disease, unspecified: Secondary | ICD-10-CM | POA: Diagnosis present

## 2019-06-19 DIAGNOSIS — E7800 Pure hypercholesterolemia, unspecified: Secondary | ICD-10-CM | POA: Diagnosis present

## 2019-06-19 DIAGNOSIS — I110 Hypertensive heart disease with heart failure: Secondary | ICD-10-CM | POA: Diagnosis present

## 2019-06-19 DIAGNOSIS — Z8249 Family history of ischemic heart disease and other diseases of the circulatory system: Secondary | ICD-10-CM | POA: Diagnosis not present

## 2019-06-19 DIAGNOSIS — E785 Hyperlipidemia, unspecified: Secondary | ICD-10-CM | POA: Diagnosis present

## 2019-06-19 DIAGNOSIS — Z902 Acquired absence of lung [part of]: Secondary | ICD-10-CM

## 2019-06-19 DIAGNOSIS — I6529 Occlusion and stenosis of unspecified carotid artery: Secondary | ICD-10-CM | POA: Diagnosis present

## 2019-06-19 DIAGNOSIS — Z7984 Long term (current) use of oral hypoglycemic drugs: Secondary | ICD-10-CM

## 2019-06-19 DIAGNOSIS — I771 Stricture of artery: Secondary | ICD-10-CM | POA: Diagnosis present

## 2019-06-19 DIAGNOSIS — Z7951 Long term (current) use of inhaled steroids: Secondary | ICD-10-CM

## 2019-06-19 DIAGNOSIS — F419 Anxiety disorder, unspecified: Secondary | ICD-10-CM | POA: Diagnosis present

## 2019-06-19 DIAGNOSIS — Z8673 Personal history of transient ischemic attack (TIA), and cerebral infarction without residual deficits: Secondary | ICD-10-CM

## 2019-06-19 DIAGNOSIS — E1136 Type 2 diabetes mellitus with diabetic cataract: Secondary | ICD-10-CM | POA: Diagnosis present

## 2019-06-19 HISTORY — PX: INTRAOPERATIVE ARTERIOGRAM: SHX5157

## 2019-06-19 HISTORY — PX: ABDOMINAL AORTIC ENDOVASCULAR STENT GRAFT: SHX5707

## 2019-06-19 HISTORY — DX: Heart failure, unspecified: I50.9

## 2019-06-19 HISTORY — DX: Malignant (primary) neoplasm, unspecified: C80.1

## 2019-06-19 HISTORY — PX: ULTRASOUND GUIDANCE FOR VASCULAR ACCESS: SHX6516

## 2019-06-19 HISTORY — DX: Family history of other specified conditions: Z84.89

## 2019-06-19 HISTORY — DX: Presence of spectacles and contact lenses: Z97.3

## 2019-06-19 HISTORY — DX: Pneumonia, unspecified organism: J18.9

## 2019-06-19 HISTORY — DX: Unspecified osteoarthritis, unspecified site: M19.90

## 2019-06-19 HISTORY — DX: Unspecified cataract: H26.9

## 2019-06-19 HISTORY — DX: Cerebral infarction, unspecified: I63.9

## 2019-06-19 LAB — APTT: aPTT: 25 seconds (ref 24–36)

## 2019-06-19 LAB — CBC
HCT: 38.2 % (ref 36.0–46.0)
Hemoglobin: 11.5 g/dL — ABNORMAL LOW (ref 12.0–15.0)
MCH: 26 pg (ref 26.0–34.0)
MCHC: 30.1 g/dL (ref 30.0–36.0)
MCV: 86.2 fL (ref 80.0–100.0)
Platelets: 296 10*3/uL (ref 150–400)
RBC: 4.43 MIL/uL (ref 3.87–5.11)
RDW: 12.9 % (ref 11.5–15.5)
WBC: 6.5 10*3/uL (ref 4.0–10.5)
nRBC: 0 % (ref 0.0–0.2)

## 2019-06-19 LAB — SURGICAL PCR SCREEN
MRSA, PCR: NEGATIVE
Staphylococcus aureus: NEGATIVE

## 2019-06-19 LAB — COMPREHENSIVE METABOLIC PANEL
ALT: 24 U/L (ref 0–44)
AST: 23 U/L (ref 15–41)
Albumin: 3.9 g/dL (ref 3.5–5.0)
Alkaline Phosphatase: 57 U/L (ref 38–126)
Anion gap: 11 (ref 5–15)
BUN: 16 mg/dL (ref 8–23)
CO2: 30 mmol/L (ref 22–32)
Calcium: 9.5 mg/dL (ref 8.9–10.3)
Chloride: 97 mmol/L — ABNORMAL LOW (ref 98–111)
Creatinine, Ser: 0.95 mg/dL (ref 0.44–1.00)
GFR calc Af Amer: >60 mL/min (ref >60)
GFR calc non Af Amer: 57 mL/min — ABNORMAL LOW (ref >60)
Glucose, Bld: 208 mg/dL — ABNORMAL HIGH (ref 70–99)
Potassium: 3.8 mmol/L (ref 3.5–5.1)
Sodium: 138 mmol/L (ref 135–145)
Total Bilirubin: 0.4 mg/dL (ref 0.3–1.2)
Total Protein: 6.4 g/dL — ABNORMAL LOW (ref 6.5–8.1)

## 2019-06-19 LAB — GLUCOSE, CAPILLARY
Glucose-Capillary: 223 mg/dL — ABNORMAL HIGH (ref 70–99)
Glucose-Capillary: 158 mg/dL — ABNORMAL HIGH (ref 70–99)

## 2019-06-19 LAB — URINALYSIS, ROUTINE W REFLEX MICROSCOPIC
Bilirubin Urine: NEGATIVE
Glucose, UA: NEGATIVE mg/dL
Hgb urine dipstick: NEGATIVE
Ketones, ur: NEGATIVE mg/dL
Leukocytes,Ua: NEGATIVE
Nitrite: NEGATIVE
Protein, ur: NEGATIVE mg/dL
Specific Gravity, Urine: 1.016 (ref 1.005–1.030)
pH: 5 (ref 5.0–8.0)

## 2019-06-19 LAB — TYPE AND SCREEN
ABO/RH(D): A POS
Antibody Screen: NEGATIVE

## 2019-06-19 LAB — POCT ACTIVATED CLOTTING TIME
Activated Clotting Time: 246 seconds
Activated Clotting Time: 252 seconds
Activated Clotting Time: 235 seconds

## 2019-06-19 LAB — HEMOGLOBIN A1C
Hgb A1c MFr Bld: 7.8 % — ABNORMAL HIGH (ref 4.8–5.6)
Mean Plasma Glucose: 177.16 mg/dL

## 2019-06-19 LAB — PROTIME-INR
INR: 1 (ref 0.8–1.2)
Prothrombin Time: 13 seconds (ref 11.4–15.2)

## 2019-06-19 SURGERY — INSERTION, ENDOVASCULAR STENT GRAFT, AORTA, ABDOMINAL
Anesthesia: General | Site: Neck | Laterality: Right

## 2019-06-19 MED ORDER — ROCURONIUM BROMIDE 10 MG/ML (PF) SYRINGE
PREFILLED_SYRINGE | INTRAVENOUS | Status: DC | PRN
  Administered 2019-06-19: 20 mg via INTRAVENOUS
  Administered 2019-06-19: 80 mg via INTRAVENOUS

## 2019-06-19 MED ORDER — ONDANSETRON HCL 4 MG/2ML IJ SOLN
INTRAMUSCULAR | Status: DC | PRN
  Administered 2019-06-19: 4 mg via INTRAVENOUS

## 2019-06-19 MED ORDER — PHENYLEPHRINE 40 MCG/ML (10ML) SYRINGE FOR IV PUSH (FOR BLOOD PRESSURE SUPPORT)
PREFILLED_SYRINGE | INTRAVENOUS | Status: AC
  Filled 2019-06-19: qty 10

## 2019-06-19 MED ORDER — POLYETHYLENE GLYCOL 3350 17 G PO PACK
17.0000 g | PACK | Freq: Every day | ORAL | Status: DC | PRN
  Administered 2019-06-19 – 2019-06-20 (×2): 17 g via ORAL
  Filled 2019-06-19 (×2): qty 1

## 2019-06-19 MED ORDER — ALUM & MAG HYDROXIDE-SIMETH 200-200-20 MG/5ML PO SUSP: 15.0000 mL | ORAL | Status: DC | PRN

## 2019-06-19 MED ORDER — OXYCODONE-ACETAMINOPHEN 5-325 MG PO TABS
1.0000 | ORAL_TABLET | ORAL | Status: DC | PRN
  Administered 2019-06-19 – 2019-06-21 (×7): 2 via ORAL
  Filled 2019-06-19 (×3): qty 2
  Filled 2019-06-19: qty 1
  Filled 2019-06-19 (×2): qty 2
  Filled 2019-06-19: qty 1
  Filled 2019-06-19: qty 2

## 2019-06-19 MED ORDER — FENTANYL CITRATE (PF) 250 MCG/5ML IJ SOLN
INTRAMUSCULAR | Status: DC | PRN
  Administered 2019-06-19: 150 ug via INTRAVENOUS
  Administered 2019-06-19 (×2): 50 ug via INTRAVENOUS

## 2019-06-19 MED ORDER — EPHEDRINE 5 MG/ML INJ
INTRAVENOUS | Status: AC
  Filled 2019-06-19: qty 10

## 2019-06-19 MED ORDER — HEPARIN SODIUM (PORCINE) 1000 UNIT/ML IJ SOLN
INTRAMUSCULAR | Status: DC | PRN
  Administered 2019-06-19: 2000 [IU] via INTRAVENOUS
  Administered 2019-06-19: 8000 [IU] via INTRAVENOUS
  Administered 2019-06-19: 2000 [IU] via INTRAVENOUS

## 2019-06-19 MED ORDER — FENTANYL CITRATE (PF) 250 MCG/5ML IJ SOLN
INTRAMUSCULAR | Status: AC
  Filled 2019-06-19: qty 5

## 2019-06-19 MED ORDER — DOCUSATE SODIUM 100 MG PO CAPS
100.0000 mg | ORAL_CAPSULE | Freq: Every day | ORAL | Status: DC
  Administered 2019-06-20 – 2019-06-21 (×2): 100 mg via ORAL
  Filled 2019-06-19 (×2): qty 1

## 2019-06-19 MED ORDER — CEFAZOLIN SODIUM-DEXTROSE 2-4 GM/100ML-% IV SOLN
2.0000 g | Freq: Three times a day (TID) | INTRAVENOUS | Status: AC
  Administered 2019-06-19 – 2019-06-20 (×2): 2 g via INTRAVENOUS
  Filled 2019-06-19 (×2): qty 100

## 2019-06-19 MED ORDER — GUAIFENESIN-DM 100-10 MG/5ML PO SYRP: 15.0000 mL | ORAL_SOLUTION | ORAL | Status: DC | PRN

## 2019-06-19 MED ORDER — PHENYLEPHRINE 40 MCG/ML (10ML) SYRINGE FOR IV PUSH (FOR BLOOD PRESSURE SUPPORT)
PREFILLED_SYRINGE | INTRAVENOUS | Status: DC | PRN
  Administered 2019-06-19: 120 ug via INTRAVENOUS
  Administered 2019-06-19: 80 ug via INTRAVENOUS
  Administered 2019-06-19: 40 ug via INTRAVENOUS

## 2019-06-19 MED ORDER — EPHEDRINE SULFATE-NACL 50-0.9 MG/10ML-% IV SOSY
PREFILLED_SYRINGE | INTRAVENOUS | Status: DC | PRN
  Administered 2019-06-19 (×2): 5 mg via INTRAVENOUS
  Administered 2019-06-19: 10 mg via INTRAVENOUS
  Administered 2019-06-19: 5 mg via INTRAVENOUS
  Administered 2019-06-19 (×2): 10 mg via INTRAVENOUS
  Administered 2019-06-19 (×2): 5 mg via INTRAVENOUS
  Administered 2019-06-19: 10 mg via INTRAVENOUS

## 2019-06-19 MED ORDER — PHENYLEPHRINE HCL-NACL 10-0.9 MG/250ML-% IV SOLN
INTRAVENOUS | Status: DC | PRN
  Administered 2019-06-19: 50 ug/min via INTRAVENOUS

## 2019-06-19 MED ORDER — ASPIRIN 81 MG PO CHEW
81.0000 mg | CHEWABLE_TABLET | Freq: Every day | ORAL | Status: DC
  Administered 2019-06-19 – 2019-06-20 (×2): 81 mg via ORAL
  Filled 2019-06-19 (×2): qty 1

## 2019-06-19 MED ORDER — PROPOFOL 10 MG/ML IV BOLUS
INTRAVENOUS | Status: AC
  Filled 2019-06-19: qty 20

## 2019-06-19 MED ORDER — CLOPIDOGREL BISULFATE 75 MG PO TABS
75.0000 mg | ORAL_TABLET | Freq: Every day | ORAL | Status: DC
  Administered 2019-06-19 – 2019-06-21 (×3): 75 mg via ORAL
  Filled 2019-06-19 (×3): qty 1

## 2019-06-19 MED ORDER — HYDROMORPHONE HCL 1 MG/ML IJ SOLN
INTRAMUSCULAR | Status: AC
  Filled 2019-06-19: qty 1

## 2019-06-19 MED ORDER — LIDOCAINE 2% (20 MG/ML) 5 ML SYRINGE
INTRAMUSCULAR | Status: AC
  Filled 2019-06-19: qty 10

## 2019-06-19 MED ORDER — MUPIROCIN 2 % EX OINT
TOPICAL_OINTMENT | CUTANEOUS | Status: AC
  Filled 2019-06-19: qty 22

## 2019-06-19 MED ORDER — SUGAMMADEX SODIUM 200 MG/2ML IV SOLN
INTRAVENOUS | Status: DC | PRN
  Administered 2019-06-19: 200 mg via INTRAVENOUS

## 2019-06-19 MED ORDER — HEPARIN SODIUM (PORCINE) 1000 UNIT/ML IJ SOLN
INTRAMUSCULAR | Status: AC
  Filled 2019-06-19: qty 1

## 2019-06-19 MED ORDER — ALBUTEROL SULFATE (2.5 MG/3ML) 0.083% IN NEBU
2.5000 mg | INHALATION_SOLUTION | Freq: Four times a day (QID) | RESPIRATORY_TRACT | Status: DC
  Administered 2019-06-19 – 2019-06-20 (×3): 2.5 mg via RESPIRATORY_TRACT
  Filled 2019-06-19 (×2): qty 3

## 2019-06-19 MED ORDER — HYDROMORPHONE HCL 1 MG/ML IJ SOLN
0.2500 mg | INTRAMUSCULAR | Status: DC | PRN
  Administered 2019-06-19 (×2): 0.5 mg via INTRAVENOUS

## 2019-06-19 MED ORDER — ACETAMINOPHEN 325 MG PO TABS: 325.0000 mg | ORAL_TABLET | ORAL | Status: DC | PRN

## 2019-06-19 MED ORDER — ALBUTEROL SULFATE HFA 108 (90 BASE) MCG/ACT IN AERS: 2.0000 | INHALATION_SPRAY | Freq: Four times a day (QID) | RESPIRATORY_TRACT | Status: DC | PRN

## 2019-06-19 MED ORDER — TRAZODONE HCL 50 MG PO TABS
50.0000 mg | ORAL_TABLET | Freq: Every day | ORAL | Status: DC
  Administered 2019-06-19 – 2019-06-20 (×2): 50 mg via ORAL
  Filled 2019-06-19 (×2): qty 1

## 2019-06-19 MED ORDER — PROPOFOL 10 MG/ML IV BOLUS
INTRAVENOUS | Status: DC | PRN
  Administered 2019-06-19: 100 mg via INTRAVENOUS
  Administered 2019-06-19: 40 mg via INTRAVENOUS

## 2019-06-19 MED ORDER — MEPERIDINE HCL 25 MG/ML IJ SOLN: 6.2500 mg | INTRAMUSCULAR | Status: DC | PRN

## 2019-06-19 MED ORDER — SODIUM CHLORIDE 0.9 % IV SOLN: INTRAVENOUS | Status: DC

## 2019-06-19 MED ORDER — SODIUM CHLORIDE 0.9 % IV SOLN
INTRAVENOUS | Status: AC
  Filled 2019-06-19: qty 1.2

## 2019-06-19 MED ORDER — CEFAZOLIN SODIUM-DEXTROSE 2-4 GM/100ML-% IV SOLN
INTRAVENOUS | Status: AC
  Filled 2019-06-19: qty 100

## 2019-06-19 MED ORDER — LACTATED RINGERS IV SOLN: INTRAVENOUS | Status: DC | PRN

## 2019-06-19 MED ORDER — ONDANSETRON HCL 4 MG/2ML IJ SOLN
INTRAMUSCULAR | Status: AC
  Filled 2019-06-19: qty 2

## 2019-06-19 MED ORDER — POTASSIUM CHLORIDE CRYS ER 20 MEQ PO TBCR
20.0000 meq | EXTENDED_RELEASE_TABLET | Freq: Every day | ORAL | Status: AC | PRN
  Administered 2019-06-19: 20 meq via ORAL

## 2019-06-19 MED ORDER — LABETALOL HCL 5 MG/ML IV SOLN
INTRAVENOUS | Status: AC
  Filled 2019-06-19: qty 4

## 2019-06-19 MED ORDER — LIDOCAINE 2% (20 MG/ML) 5 ML SYRINGE
INTRAMUSCULAR | Status: DC | PRN
  Administered 2019-06-19: 10 mg via INTRAVENOUS

## 2019-06-19 MED ORDER — LABETALOL HCL 5 MG/ML IV SOLN
5.0000 mg | INTRAVENOUS | Status: DC | PRN
  Administered 2019-06-19 (×3): 5 mg via INTRAVENOUS

## 2019-06-19 MED ORDER — BISACODYL 10 MG RE SUPP: 10.0000 mg | Freq: Every day | RECTAL | Status: DC | PRN

## 2019-06-19 MED ORDER — MAGNESIUM SULFATE 2 GM/50ML IV SOLN: 2.0000 g | Freq: Every day | INTRAVENOUS | Status: DC | PRN

## 2019-06-19 MED ORDER — CHLORHEXIDINE GLUCONATE CLOTH 2 % EX PADS: 6.0000 | MEDICATED_PAD | Freq: Once | CUTANEOUS | Status: DC

## 2019-06-19 MED ORDER — HYDRALAZINE HCL 20 MG/ML IJ SOLN: 5.0000 mg | INTRAMUSCULAR | Status: DC | PRN

## 2019-06-19 MED ORDER — GABAPENTIN 100 MG PO CAPS
200.0000 mg | ORAL_CAPSULE | Freq: Two times a day (BID) | ORAL | Status: DC
  Administered 2019-06-19 – 2019-06-21 (×4): 200 mg via ORAL
  Filled 2019-06-19 (×4): qty 2

## 2019-06-19 MED ORDER — PANTOPRAZOLE SODIUM 40 MG PO TBEC
40.0000 mg | DELAYED_RELEASE_TABLET | Freq: Two times a day (BID) | ORAL | Status: DC
  Administered 2019-06-19 – 2019-06-21 (×4): 40 mg via ORAL
  Filled 2019-06-19 (×4): qty 1

## 2019-06-19 MED ORDER — METOPROLOL TARTRATE 5 MG/5ML IV SOLN: 2.0000 mg | INTRAVENOUS | Status: DC | PRN

## 2019-06-19 MED ORDER — ONDANSETRON HCL 4 MG/2ML IJ SOLN: 4.0000 mg | Freq: Once | INTRAMUSCULAR | Status: DC | PRN

## 2019-06-19 MED ORDER — PHENOL 1.4 % MT LIQD: 1.0000 | OROMUCOSAL | Status: DC | PRN

## 2019-06-19 MED ORDER — POTASSIUM CHLORIDE CRYS ER 20 MEQ PO TBCR
20.0000 meq | EXTENDED_RELEASE_TABLET | Freq: Every day | ORAL | Status: DC
  Administered 2019-06-20: 20 meq via ORAL
  Filled 2019-06-19 (×3): qty 1

## 2019-06-19 MED ORDER — MUPIROCIN 2 % EX OINT: 1.0000 "application " | TOPICAL_OINTMENT | Freq: Once | CUTANEOUS | Status: DC

## 2019-06-19 MED ORDER — SODIUM CHLORIDE 0.9 % IV SOLN: 500.0000 mL | Freq: Once | INTRAVENOUS | Status: DC | PRN

## 2019-06-19 MED ORDER — ACETAMINOPHEN 325 MG RE SUPP: 325.0000 mg | RECTAL | Status: DC | PRN

## 2019-06-19 MED ORDER — EPHEDRINE 5 MG/ML INJ
INTRAVENOUS | Status: AC
  Filled 2019-06-19: qty 20

## 2019-06-19 MED ORDER — PRAVASTATIN SODIUM 10 MG PO TABS
20.0000 mg | ORAL_TABLET | Freq: Every day | ORAL | Status: DC
  Administered 2019-06-20: 20 mg via ORAL
  Filled 2019-06-19 (×2): qty 2

## 2019-06-19 MED ORDER — GLYCOPYRROLATE PF 0.2 MG/ML IJ SOSY
PREFILLED_SYRINGE | INTRAMUSCULAR | Status: AC
  Filled 2019-06-19: qty 1

## 2019-06-19 MED ORDER — DEXAMETHASONE SODIUM PHOSPHATE 10 MG/ML IJ SOLN
INTRAMUSCULAR | Status: AC
  Filled 2019-06-19: qty 1

## 2019-06-19 MED ORDER — MORPHINE SULFATE (PF) 2 MG/ML IV SOLN: 2.0000 mg | INTRAVENOUS | Status: DC | PRN

## 2019-06-19 MED ORDER — METOPROLOL TARTRATE 50 MG PO TABS
50.0000 mg | ORAL_TABLET | Freq: Two times a day (BID) | ORAL | Status: DC
  Administered 2019-06-19 – 2019-06-21 (×4): 50 mg via ORAL
  Filled 2019-06-19 (×4): qty 1

## 2019-06-19 MED ORDER — ROCURONIUM BROMIDE 10 MG/ML (PF) SYRINGE
PREFILLED_SYRINGE | INTRAVENOUS | Status: AC
  Filled 2019-06-19: qty 40

## 2019-06-19 MED ORDER — PROTAMINE SULFATE 10 MG/ML IV SOLN
INTRAVENOUS | Status: DC | PRN
  Administered 2019-06-19: 10 mg via INTRAVENOUS
  Administered 2019-06-19: 40 mg via INTRAVENOUS

## 2019-06-19 MED ORDER — LABETALOL HCL 5 MG/ML IV SOLN: 10.0000 mg | INTRAVENOUS | Status: DC | PRN

## 2019-06-19 MED ORDER — SODIUM CHLORIDE 0.9 % IV SOLN
INTRAVENOUS | Status: DC | PRN
  Administered 2019-06-19: 500 mL

## 2019-06-19 MED ORDER — CEFAZOLIN SODIUM-DEXTROSE 2-4 GM/100ML-% IV SOLN
2.0000 g | INTRAVENOUS | Status: AC
  Administered 2019-06-19: 2 g via INTRAVENOUS

## 2019-06-19 MED ORDER — ALBUTEROL SULFATE (2.5 MG/3ML) 0.083% IN NEBU
2.5000 mg | INHALATION_SOLUTION | Freq: Four times a day (QID) | RESPIRATORY_TRACT | Status: DC | PRN
  Filled 2019-06-19: qty 3

## 2019-06-19 MED ORDER — ONDANSETRON HCL 4 MG/2ML IJ SOLN
4.0000 mg | Freq: Four times a day (QID) | INTRAMUSCULAR | Status: DC | PRN
  Administered 2019-06-19 – 2019-06-21 (×3): 4 mg via INTRAVENOUS
  Filled 2019-06-19 (×3): qty 2

## 2019-06-19 MED ORDER — IODIXANOL 320 MG/ML IV SOLN
INTRAVENOUS | Status: DC | PRN
  Administered 2019-06-19: 90 mL via INTRA_ARTERIAL

## 2019-06-19 MED ORDER — 0.9 % SODIUM CHLORIDE (POUR BTL) OPTIME
TOPICAL | Status: DC | PRN
  Administered 2019-06-19: 1000 mL

## 2019-06-19 SURGICAL SUPPLY — 70 items
ADH SKN CLS APL DERMABOND .7 (GAUZE/BANDAGES/DRESSINGS) ×3
CANISTER SUCT 3000ML PPV (MISCELLANEOUS) ×4 IMPLANT
CATH BEACON 5 .035 40 KMP TP (CATHETERS) IMPLANT
CATH BEACON 5 .038 40 KMP TP (CATHETERS) ×4
CATH BEACON 5.038 65CM KMP-01 (CATHETERS) ×1 IMPLANT
CATH OMNI FLUSH .035X70CM (CATHETERS) ×1 IMPLANT
CATH ROBINSON RED A/P 18FR (CATHETERS) ×1 IMPLANT
CLIP VESOCCLUDE MED 6/CT (CLIP) ×1 IMPLANT
CLIP VESOCCLUDE SM WIDE 6/CT (CLIP) ×1 IMPLANT
COVER WAND RF STERILE (DRAPES) ×3 IMPLANT
DERMABOND ADVANCED (GAUZE/BANDAGES/DRESSINGS) ×1
DERMABOND ADVANCED .7 DNX12 (GAUZE/BANDAGES/DRESSINGS) ×3 IMPLANT
DEVICE TORQUE KENDALL .025-038 (MISCELLANEOUS) ×4 IMPLANT
DRAPE HALF SHEET 40X57 (DRAPES) ×1 IMPLANT
DRSG TEGADERM 2-3/8X2-3/4 SM (GAUZE/BANDAGES/DRESSINGS) ×6 IMPLANT
DRSG TEGADERM 4X4.75 (GAUZE/BANDAGES/DRESSINGS) ×1 IMPLANT
ELECT CAUTERY BLADE 6.4 (BLADE) ×1 IMPLANT
ELECT REM PT RETURN 9FT ADLT (ELECTROSURGICAL) ×8
ELECTRODE REM PT RTRN 9FT ADLT (ELECTROSURGICAL) ×6 IMPLANT
GAUZE SPONGE 2X2 8PLY STRL LF (GAUZE/BANDAGES/DRESSINGS) ×3 IMPLANT
GLIDEWIRE ADV .035X260CM (WIRE) ×1 IMPLANT
GLOVE BIO SURGEON STRL SZ7.5 (GLOVE) ×4 IMPLANT
GLOVE BIOGEL PI IND STRL 8 (GLOVE) ×3 IMPLANT
GLOVE BIOGEL PI INDICATOR 8 (GLOVE) ×1
GOWN STRL REUS W/ TWL LRG LVL3 (GOWN DISPOSABLE) ×9 IMPLANT
GOWN STRL REUS W/ TWL XL LVL3 (GOWN DISPOSABLE) ×3 IMPLANT
GOWN STRL REUS W/TWL LRG LVL3 (GOWN DISPOSABLE) ×12
GOWN STRL REUS W/TWL XL LVL3 (GOWN DISPOSABLE) ×4
GUIDEWIRE ANGLED .035X150CM (WIRE) ×4 IMPLANT
KIT BASIN OR (CUSTOM PROCEDURE TRAY) ×4 IMPLANT
KIT ENCORE 26 ADVANTAGE (KITS) ×3 IMPLANT
KIT TURNOVER KIT B (KITS) ×4 IMPLANT
LOOP VESSEL MAXI BLUE (MISCELLANEOUS) ×1 IMPLANT
LOOP VESSEL MINI RED (MISCELLANEOUS) ×1 IMPLANT
NS IRRIG 1000ML POUR BTL (IV SOLUTION) ×4 IMPLANT
PACK ENDOVASCULAR (PACKS) ×4 IMPLANT
PAD ARMBOARD 7.5X6 YLW CONV (MISCELLANEOUS) ×8 IMPLANT
PENCIL BUTTON HOLSTER BLD 10FT (ELECTRODE) ×1 IMPLANT
SET MICROPUNCTURE 5F STIFF (MISCELLANEOUS) ×4 IMPLANT
SHEATH BRITE TIP 7FRX11 (SHEATH) ×1 IMPLANT
SHEATH PINNACLE 5F 10CM (SHEATH) ×1 IMPLANT
SHEATH PINNACLE ST 7F 45CM (SHEATH) ×1 IMPLANT
SPONGE GAUZE 2X2 STER 10/PKG (GAUZE/BANDAGES/DRESSINGS) ×1
STENT VIABAHN 6X29X80 VBX (Permanent Stent) ×1 IMPLANT
STENT VIABAHNBX 7X29X135 (Permanent Stent) ×1 IMPLANT
STENT VIABAHNBX 8X29X135 (Permanent Stent) ×1 IMPLANT
STOPCOCK MORSE 400PSI 3WAY (MISCELLANEOUS) ×3 IMPLANT
SUT ETHILON 3 0 PS 1 (SUTURE) IMPLANT
SUT MNCRL AB 4-0 PS2 18 (SUTURE) ×6 IMPLANT
SUT PROLENE 5 0 C 1 24 (SUTURE) IMPLANT
SUT PROLENE 6 0 BV (SUTURE) ×2 IMPLANT
SUT SILK 2 0 (SUTURE) ×4
SUT SILK 2-0 18XBRD TIE 12 (SUTURE) IMPLANT
SUT SILK 3 0 (SUTURE) ×4
SUT SILK 3-0 18XBRD TIE 12 (SUTURE) IMPLANT
SUT SILK 4 0 (SUTURE) ×4
SUT SILK 4-0 18XBRD TIE 12 (SUTURE) IMPLANT
SUT VIC AB 2-0 CT1 27 (SUTURE) ×8
SUT VIC AB 2-0 CT1 TAPERPNT 27 (SUTURE) IMPLANT
SUT VIC AB 2-0 CTX 36 (SUTURE) IMPLANT
SUT VIC AB 3-0 SH 27 (SUTURE) ×4
SUT VIC AB 3-0 SH 27X BRD (SUTURE) IMPLANT
SYR MEDRAD MARK V 150ML (SYRINGE) ×1 IMPLANT
TAPE UMBILICAL COTTON 1/8X30 (MISCELLANEOUS) ×1 IMPLANT
TOWEL GREEN STERILE (TOWEL DISPOSABLE) ×4 IMPLANT
TRAY FOLEY MTR SLVR 16FR STAT (SET/KITS/TRAYS/PACK) ×4 IMPLANT
TUBING INJECTOR 48 (MISCELLANEOUS) ×1 IMPLANT
WIRE BENTSON .035X145CM (WIRE) ×1 IMPLANT
WIRE ROSEN-J .035X260CM (WIRE) ×1 IMPLANT
WIRE TORQFLEX AUST .018X40CM (WIRE) ×1 IMPLANT

## 2019-06-19 NOTE — Op Note (Signed)
Date: June 19, 2019  Preoperative diagnosis: Calcified high-grade innominate stenosis as well as proximal right common carotid and proximal right subclavian artery stenosis in the setting of vertebrobasilar insufficiency  Postoperative diagnosis: Same  Procedure: 1.  Redo exposure of the right common carotid artery 2.  Arch aortogram with right upper extremity arteriogram 3.  Ultrasound-guided access of the right brachial artery 4.  Retrograde innominate angioplasty with stent placement from carotid approach (8 mm x 29 mm VBX) 5.  Retrograde proximal right common carotid artery angioplasty with stent placement from carotid approach (6 mm x 29 mm VBX) 6.  Retrograde proximal right subclavian artery angioplasty with stent placement from brachial approach (7 mm x 29 mm VBX)  Surgeon: Dr. Marty Heck, MD  Assistant: Dr. Curt Jews, MD  Indication: Patient is a 79 year old female well-known to the vascular surgery service that has previously undergone bilateral carotid endarterectomies as well as left subclavian stent.  Ultimately she has had extensive work-up for profound dizziness and there is concern that she has vertebrobasilar insufficiency given high-grade stenosis in the innominate as well as the proximal right common carotid and subclavian artery.  She presents today for planned hybrid endovascular approach in the OR given she is high risk and not a candidate for sternotomy.  Findings: Cutdown on the right common carotid artery at her previous endarterectomy site.  Ultimately accessed the common carotid retrograde with a pursestring using distal control for embolic protection and subsequently crossed her right common carotid and innominate stenosis retrograde into the arch.  Also access the right brachial artery percutaneously with ultrasound and crossed the subclavian stenosis retrograde.  Initially treated the innominate high-grade calcified stenosis with a 8 mm x 29 mm VBX and then  performed kissing stents in the proximal right common carotid and right subclavian arteries using a 6 mm x 29 mm VBX stent in the right common carotid and 7 mm x 29 mm VBX in right subclavian.  She has no evidence of residual stenosis with excellent inline flow in both stents.  Palpable right radial and carotid pulse after the case.  Anesthesia: General  Details: Patient was taken to the operating room after informed consent was obtained.  Placed on operative table in the supine position.  Ultimately right arm as well as right neck were then prepped draped usual sterile fashion after general anesthesia was induced.  Initially timeout was performed and antibiotics were given.  I started with incision at her previous neck incision on the right anterior to the sternocleidomastoid and ultimately sternocleidomastoid as well as the internal jugular vein were mobilized laterally until we found the common carotid artery and we could visualize the vagus posterior.  This was ultimately dissected out with Metzenbaum scissors and controlled distally below the bifurcation with a vessel loop.  At that point in time we then went ahead and evaluated the right brachial artery with ultrasound it was patent and image was saved.  Accessed the right brachial artery with micro access needle under ultrasound guidance placed a microwire and a micro sheath and then a Bentson wire and exchanged for a short 5 [place] sheath.  At this point patient was given 100 units/kilogram heparin.  ACT was were checked throughout the case to maintain greater than 250.  Then placed a pursestring with a 6-0 Prolene in the common carotid artery where it was soft.  We then accessed this retrograde with a micro access needle placed a microwire and then a short micro sheath.  Then using a Glidewire advantage we were able to cross the common carotid stenosis retrograde but had trouble crossing innominate lesion.  We had enough purchase they were able to place  a short 7 French sheath retrograde in the right common carotid artery exposure site.  And then used a KMP catheter with the Glidewire advantage to get retrograde across the high-grade calcified innominate stenosis into the arch.  That point in time we then advanced a long 7 French bright tip sheath across the stenosis.  We then use multiple angles shooting arch aortogram in order to define the ostium of the innominate artery as well as the bifurcation of the common carotid and subclavian.  We ultimately selected a 8 mm x 29 mm VBX that was deployed in the innominate artery across the ostial calcified stenosis.  Ultimately after deployment of the stent and once we were satisfied I then came from the arm retrograde and got a wire across the subclavian stenosis and then across the innominate stent.  At that point time we exchanged for a long 7 French sheath in the right brachial artery while working with our 7 [place] bright tip sheath retrograde from the common carotid.  At that point time we selected a 6 mm x 29 mm VBX for the proximal right common carotid with a 7 mmx 29 mm VBX for the proximal right subclavian and kissing stents were deployed at the same time to nominal pressure.  Once we were satisfied all wires and catheters were removed.  The 7 French sheath in the right common carotid artery was removed using Vesseloops for control and we are able to flush the artery antegrade and retrograde through the 7 [place] hole to try and get rid of any clot or other embolic embolic material.  We then reperfused the brain after approximately 1 hour.  The 7 French sheath in the right brachial artery was then removed and manual pressure was held.  Patient was given 50 mg protamine for reversal.  The neck was then irrigated with sterile saline.  Surgicel snow was used for hemostasis.  The platysma was run closed with 3-0 Vicryl and 4-0 Monocryl was placed in the skin with Dermabond.  We held pressure on the brachial access  site for over 20 minutes to get good hemostasis with a palpable radial pulse.  Complication: None  Condition: Stable  Marty Heck, MD [facility] [place] Office: [place]

## 2019-06-19 NOTE — Anesthesia Procedure Notes (Signed)
Procedure Name: Intubation Date/Time: 06/19/2019 10:07 AM Performed by: Colin Benton, CRNA Pre-anesthesia Checklist: Patient identified, Emergency Drugs available, Suction available and Patient being monitored Patient Re-evaluated:Patient Re-evaluated prior to induction Oxygen Delivery Method: Circle system utilized Induction Type: IV induction Ventilation: Mask ventilation without difficulty Laryngoscope Size: Miller and 2 Grade View: Grade II Tube type: Oral Tube size: 7.0 mm Number of attempts: 1 Airway Equipment and Method: Stylet Placement Confirmation: ETT inserted through vocal cords under direct vision,  positive ETCO2 and breath sounds checked- equal and bilateral Secured at: 22 cm Tube secured with: Tape Dental Injury: Teeth and Oropharynx as per pre-operative assessment

## 2019-06-19 NOTE — Progress Notes (Signed)
  Day of Surgery Note    Subjective:  Resting comfortably. Neck pain with movement   Vitals:   06/19/19 1447 06/19/19 1524  BP: (!) 141/67   Pulse: 80 80  Resp: 14 20  Temp: 97.6 F (36.4 C)   SpO2: 96% 96%   Neuro: alert and oriented x 4; tongue midline, face symmetrical Incisions:   Mild edema right neck incision; ecchymosis  Extremities:  5/5 bil grip strength. Right brachial art puncture site is without bleeding or hematoma Cardiac:  RRR Lungs:  nonlabored Abdomen:  Soft    Assessment/Plan:  This is a 79 y.o. female who is s/p redo exposure right carotid; s/p inominate, CCA and right sub art angioplasty and stent placement.  Neuro intact. VVS.  -Risa Grill, PA-C 06/19/2019 3:25 PM [phone]

## 2019-06-19 NOTE — Transfer of Care (Signed)
Immediate Anesthesia Transfer of Care Note  Patient: Caitlin Oliver  Procedure(s) Performed: INNOMINATE STENT, RIGHT CAROTID STENT, RIGHT SUBCLAVIAN STENT AND CAROTID CUTDOWN (Right Neck) Ultrasound Guidance For Vascular Access (Right Arm Upper) Arch Aortogram (N/A Chest)  Patient Location: PACU  Anesthesia Type:General  Level of Consciousness: awake, alert , oriented and patient cooperative  Airway & Oxygen Therapy: Patient Spontanous Breathing and Patient connected to nasal cannula oxygen  Post-op Assessment: Report given to RN, Post -op Vital signs reviewed and stable and Patient moving all extremities X 4  Post vital signs: Reviewed and stable  Last Vitals:  Vitals Value Taken Time  BP 125/53 06/19/19 1253  Temp    Pulse 94 06/19/19 1254  Resp 18 06/19/19 1254  SpO2 93 % 06/19/19 1254  Vitals shown include unvalidated device data.  Last Pain:  Vitals:   06/19/19 0641  TempSrc:   PainSc: 7       Patients Stated Pain Goal: 3 (46/95/07 2257)  Complications: No apparent anesthesia complications

## 2019-06-19 NOTE — H&P (Signed)
History and Physical Interval Note:  06/19/2019 9:13 AM  Caitlin Oliver  has presented today for surgery, with the diagnosis of INNOMINATE ARTERY STENOSIS, SUBCLAVIAN ARTERY STENOSIS.  The various methods of treatment have been discussed with the patient and family. After consideration of risks, benefits and other options for treatment, the patient has consented to  Procedure(s): INNOMINATE STENT, RIGHT CAROTID STENT, RIGHT SUBCLAVIAN STENT AND Possible,CAROTID CUTDOWN (Right) as a surgical intervention.  The patient's history has been reviewed, patient examined, no change in status, stable for surgery.  I have reviewed the patient's chart and labs.  Questions were answered to the patient's satisfaction.    Cut-down right carotid with retrograde innominate stent and possible right carotid and right subclavian stent for posterior circulation issues.    Marty Heck  [facility]  Patient name: Caitlin Oliver MRN: [id number] DOB: 11-Oct-1940 Sex: female  REASON FOR VISIT: Continued discussion of posterior circulation symptoms after CT angiogram  HPI:  Caitlin Oliver is a 79 y.o. female here today for further discussion. We are also discussing this with her niece [name] who helps with her medical decision making by telephone. She reports that she continues to be quite miserable with chronic dizziness and nausea. Reports this has been progressive for 6 months now.she has been evaluated with neurology with an extensive work-up and is felt that this is related to posterior circulation ischemia. I had seen her 2 weeks ago after a cerebral arteriogram. I recommended CT angiogram for further definition of the degree of stenosis in her innominate and right subclavian and carotid artery. Also discussed the case with Dr. Monica Martinez for possible advanced endovascular treatment with hybrid OR treatment of her difficult complex problem. She has no new symptoms.      Past  Medical History:  Diagnosis Date  . Anxiety   . Carotid artery disease ([place])   . Claudication ([place])   . COPD (chronic obstructive pulmonary disease) ([place])   . Diabetes mellitus   . Dizzy   . GERD (gastroesophageal reflux disease)   . History of kidney stones   . Hypercholesteremia   . Hypertension   . 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 Stomach ulcer   . Tobacco abuse         Family History  Problem Relation Age of Onset  . Stroke Mother   . Hypertension Mother   . Kidney failure Father    SOCIAL HISTORY:  Social History        Tobacco Use  . Smoking status: Former Smoker    Packs/day: 1.50    Years: 56.00    Pack years: 84.00    Types: Cigarettes    Quit date: 07/07/2013    Years since quitting: 5.9  . Smokeless tobacco: Former Systems developer    Quit date: 02/25/2013  Substance Use Topics  . Alcohol use: No   No Known Allergies        Current Outpatient Medications  Medication Sig Dispense Refill  . albuterol (PROVENTIL HFA;VENTOLIN HFA) 108 (90 Base) MCG/ACT inhaler Inhale 2 puffs into the lungs every 6 (six) hours as needed for wheezing or shortness of breath.     . ALPRAZolam (XANAX) 0.5 MG tablet Take 1-2 tablets 45 minutes before MRI, may take a 3rd if needed 3 tablet 0  . aspirin 81 MG chewable tablet Chew by mouth daily.    . budesonide (PULMICORT) 0.25 MG/2ML  nebulizer solution Take 2 mLs (0.25 mg total) by nebulization 2 (two) times daily. 60 mL 12  . calcium carbonate (CALCIUM 600) 600 MG TABS tablet Take 600 mg by mouth daily before supper.     . clopidogrel (PLAVIX) 75 MG tablet Take 1 tablet (75 mg total) by mouth daily.    [name] gabapentin (NEURONTIN) 100 MG capsule Take 1 capsule (100 mg total) by mouth 3 (three) times daily. (Patient taking differently: Take 100 mg by mouth. 100 mg in the morning and 200 at lunch and bedtime) 90 capsule 0  . HYDROcodone-acetaminophen (NORCO/VICODIN) 5-325 MG tablet Take 1 tablet  by mouth every 12 (twelve) hours as needed for moderate pain. 10 tablet 0  . lisinopril (ZESTRIL) 2.5 MG tablet Take 1 tablet (2.5 mg total) by mouth daily. 30 tablet 0  . MAGNESIUM PO Take 1 tablet by mouth daily before supper.    . meclizine (ANTIVERT) 12.5 MG tablet Take 12.5 mg by mouth 3 (three) times daily as needed for dizziness or nausea.     . Menthol, Topical Analgesic, (ICY HOT EX) Apply 1 spray topically every 8 (eight) hours as needed (pain).    . metFORMIN (GLUCOPHAGE) 500 MG tablet Take by mouth 2 (two) times daily with a meal.    . metoprolol tartrate (LOPRESSOR) 50 MG tablet Take 50 mg by mouth 2 (two) times daily.    . Omega-3 Fatty Acids (FISH OIL PO) Take 1 capsule by mouth daily.    . ondansetron (ZOFRAN) 4 MG tablet Take 4 mg by mouth at bedtime as needed for nausea or vomiting.     . ONE TOUCH ULTRA TEST test strip     . pantoprazole (PROTONIX) 40 MG tablet Take 40 mg by mouth 2 (two) times daily.     . polyethylene glycol (MIRALAX / GLYCOLAX) 17 g packet Take by mouth.    . pravastatin (PRAVACHOL) 40 MG tablet Take 40 mg by mouth daily before supper.     . torsemide (DEMADEX) 20 MG tablet Take 62m in the AM and 268min the PM. 90 tablet 3  . traZODone (DESYREL) 50 MG tablet Take 50 mg by mouth at bedtime.     No current facility-administered medications for this visit.   REVIEW OF SYSTEMS:  _0  denotes positive finding, _1  denotes negative finding  Cardiac  Comments:  Chest pain or chest pressure:    Shortness of breath upon exertion: x   Short of breath when lying flat: x   Irregular heart rhythm:        Vascular    Pain in calf, thigh, or hip brought on by ambulation:    Pain in feet at night that wakes you up from your sleep:     Blood clot in your veins:    Leg swelling:         PHYSICAL EXAM:     Vitals:   06/09/19 1530  BP: 132/69  Pulse: 86  Resp: 20  SpO2: 93%  Weight: 173 lb (78.5 kg)  Height: _2  (1.626 m)   GENERAL: The patient is a  well-nourished female, in no acute distress. The vital signs are documented above.  CARDIOVASCULAR: 2+ left radial pulse. Absent right radial pulse  PULMONARY: There is good air exchange  MUSCULOSKELETAL: There are no major deformities or cyanosis.  NEUROLOGIC: No focal weakness or paresthesias are detected.  SKIN: There are no ulcers or rashes noted.  PSYCHIATRIC: The patient has a normal affect.  DATA:  CT angiogram revealed near total occlusion of the innominate artery at the origin with extensive calcification of the arch. There is then is an area above the origin with mild narrowing of the innominate right at its bifurcation. There is a high-grade stenosis of the right common carotid and apparently mild stenosis of the right subclavian takeoff  MEDICAL ISSUES:  Dr. Carlis Abbott and myself both had extensive discussion with the patient and her niece. She reports that she is quite miserable and cannot tolerate her current symptoms. I again explained that her symptoms do correspond with posterior circulation but there is no definitive way of knowing revascularization will correct her symptoms other than correcting her arterial flow. She clearly could not tolerate sternotomy and direct arch replacement. I do feel that she could tolerate carotid exposure. Dr. Carlis Abbott discussed the option of direct exposure of her carotid and brachial artery with retrograde stenting of her innominate artery and also of her right common carotid takeoff and potentially right subclavian takeoff as well depending on intraoperative imaging studies. I also discussed the potential for stroke despite protection with occlusion of flow to her brain during the manipulation. She underneath discussed this with her in our presence and feel that they wish to proceed with surgery as discussed. She is to see Dr. Pierre Bali for already scheduled follow-up tomorrow of her known pulmonary hypertension and congestive failure. They will discuss what  preoperative optimization is required if any. We will schedule surgery following this visit.  Rosetta Posner, MD FACS  [facility] Tel [phone]  Pager [phone]

## 2019-06-19 NOTE — Anesthesia Postprocedure Evaluation (Signed)
Anesthesia Post Note  Patient: Caitlin Oliver  Procedure(s) Performed: INNOMINATE STENT, RIGHT CAROTID STENT, RIGHT SUBCLAVIAN STENT AND CAROTID CUTDOWN (Right Neck) Ultrasound Guidance For Vascular Access (Right Arm Upper) Arch Aortogram (N/A Chest)     Patient location during evaluation: PACU Anesthesia Type: General Level of consciousness: awake and alert Pain management: pain level controlled Vital Signs Assessment: post-procedure vital signs reviewed and stable Respiratory status: spontaneous breathing, nonlabored ventilation, respiratory function stable and patient connected to nasal cannula oxygen Cardiovascular status: blood pressure returned to baseline and stable Postop Assessment: no apparent nausea or vomiting Anesthetic complications: no    Last Vitals:  Vitals:   06/19/19 1447 06/19/19 1524  BP: (!) 141/67   Pulse: 80 80  Resp: 14 20  Temp: 36.4 C   SpO2: 96% 96%    Last Pain:  Vitals:   06/19/19 1447  TempSrc: Oral  PainSc: 5                  Graceann Boileau DAVID

## 2019-06-19 NOTE — Anesthesia Procedure Notes (Signed)
Arterial Line Insertion Start/End3/08/2019 7:45 AM, 06/19/2019 8:00 AM Performed by: Kyung Rudd, CRNA  Patient location: Pre-op. Preanesthetic checklist: patient identified, IV checked, site marked, risks and benefits discussed, surgical consent, monitors and equipment checked, pre-op evaluation and timeout performed Lidocaine 1% used for infiltration Left, radial was placed Catheter size: 20 G Hand hygiene performed , maximum sterile barriers used  and Seldinger technique used Allen's test indicative of satisfactory collateral circulation Attempts: 2 Procedure performed without using ultrasound guided technique. Following insertion, Biopatch and dressing applied. Post procedure assessment: normal  Patient tolerated the procedure well with no immediate complications.

## 2019-06-20 LAB — BASIC METABOLIC PANEL
Anion gap: 11 (ref 5–15)
BUN: 9 mg/dL (ref 8–23)
CO2: 27 mmol/L (ref 22–32)
Calcium: 8.7 mg/dL — ABNORMAL LOW (ref 8.9–10.3)
Chloride: 99 mmol/L (ref 98–111)
Creatinine, Ser: 0.75 mg/dL (ref 0.44–1.00)
GFR calc Af Amer: >60 mL/min (ref >60)
GFR calc non Af Amer: >60 mL/min (ref >60)
Glucose, Bld: 166 mg/dL — ABNORMAL HIGH (ref 70–99)
Potassium: 3.9 mmol/L (ref 3.5–5.1)
Sodium: 137 mmol/L (ref 135–145)

## 2019-06-20 LAB — CBC
HCT: 32.1 % — ABNORMAL LOW (ref 36.0–46.0)
Hemoglobin: 9.7 g/dL — ABNORMAL LOW (ref 12.0–15.0)
MCH: 26.6 pg (ref 26.0–34.0)
MCHC: 30.2 g/dL (ref 30.0–36.0)
MCV: 88.2 fL (ref 80.0–100.0)
Platelets: 266 10*3/uL (ref 150–400)
RBC: 3.64 MIL/uL — ABNORMAL LOW (ref 3.87–5.11)
RDW: 13.1 % (ref 11.5–15.5)
WBC: 7.3 10*3/uL (ref 4.0–10.5)
nRBC: 0 % (ref 0.0–0.2)

## 2019-06-20 MED ORDER — IPRATROPIUM-ALBUTEROL 0.5-2.5 (3) MG/3ML IN SOLN
3.0000 mL | Freq: Two times a day (BID) | RESPIRATORY_TRACT | Status: DC
  Administered 2019-06-20 – 2019-06-21 (×2): 3 mL via RESPIRATORY_TRACT
  Filled 2019-06-20 (×2): qty 3

## 2019-06-20 MED ORDER — IPRATROPIUM-ALBUTEROL 0.5-2.5 (3) MG/3ML IN SOLN: 3.0000 mL | Freq: Two times a day (BID) | RESPIRATORY_TRACT | Status: DC

## 2019-06-20 MED ORDER — ALBUTEROL SULFATE (2.5 MG/3ML) 0.083% IN NEBU: 2.5000 mg | INHALATION_SOLUTION | Freq: Two times a day (BID) | RESPIRATORY_TRACT | Status: DC

## 2019-06-20 NOTE — Plan of Care (Signed)
POC initiated and progressing.

## 2019-06-20 NOTE — Progress Notes (Signed)
Pt admitted today post carotid surgery.  Pt placed on telemetry and CCMD notified.  CHG bath completed.  Vitals taken.  Initial stroke scale and neuro assessment completed per carotid surgery protocol.  Pt currently stable, neuro intact, A&O X 4.  No abnormalities to report at this time.  Pt will continue to be monitored.

## 2019-06-20 NOTE — Progress Notes (Addendum)
Progress Note    06/20/2019 8:24 AM 1 Day Post-Op  Subjective:  Nausea/vomiting overnight.  Appetite this morning.  Soreness R neck and arm.  Denies slurred speech, changes in vision, left sided weakness   Vitals:   06/20/19 0446 06/20/19 0758  BP: 120/65   Pulse: 83 75  Resp: 17 16  Temp: 97.8 F (36.6 C)   SpO2: 98%    Physical Exam Lungs:  Non labored Incisions:  R neck with ecchymosis but soft Extremities:  R arm with ecchymosis but soft; palpable R radial pulse Abdomen:  Soft Neurologic: A&O; CN grossly intact  CBC    Component Value Date/Time   WBC 7.3 06/20/2019 0525   RBC 3.64 (L) 06/20/2019 0525   HGB 9.7 (L) 06/20/2019 0525   HGB 15.1 (H) 03/10/2018 1156   HGB 16.0 (H) 06/03/2017 1414   HGB 16.0 (H) 03/12/2017 1121   HCT 32.1 (L) 06/20/2019 0525   HCT 47.5 (H) 06/03/2017 1414   HCT 48.0 (H) 03/12/2017 1121   PLT 266 06/20/2019 0525   PLT 275 03/10/2018 1156   PLT 269 06/03/2017 1414   MCV 88.2 06/20/2019 0525   MCV 90 06/03/2017 1414   MCV 88.4 03/12/2017 1121   MCH 26.6 06/20/2019 0525   MCHC 30.2 06/20/2019 0525   RDW 13.1 06/20/2019 0525   RDW 13.2 06/03/2017 1414   RDW 12.7 03/12/2017 1121   LYMPHSABS 1.0 11/21/2018 0525   LYMPHSABS 1.7 03/12/2017 1121   MONOABS 0.6 11/21/2018 0525   MONOABS 0.6 03/12/2017 1121   EOSABS 0.4 11/21/2018 0525   EOSABS 0.1 03/12/2017 1121   BASOSABS 0.0 11/21/2018 0525   BASOSABS 0.1 03/12/2017 1121    BMET    Component Value Date/Time   NA 137 06/20/2019 0525   NA 138 06/03/2017 1414   NA 138 03/12/2017 1121   K 3.9 06/20/2019 0525   K 4.6 03/12/2017 1121   CL 99 06/20/2019 0525   CO2 27 06/20/2019 0525   CO2 27 03/12/2017 1121   GLUCOSE 166 (H) 06/20/2019 0525   GLUCOSE 148 (H) 03/12/2017 1121   BUN 9 06/20/2019 0525   BUN 20 06/03/2017 1414   BUN 19.6 03/12/2017 1121   CREATININE 0.75 06/20/2019 0525   CREATININE 0.78 03/10/2018 1156   CREATININE 0.9 03/12/2017 1121   CALCIUM 8.7 (L)  06/20/2019 0525   CALCIUM 9.6 03/12/2017 1121   GFRNONAA >60 06/20/2019 0525   GFRNONAA >60 03/10/2018 1156   GFRAA >60 06/20/2019 0525   GFRAA >60 03/10/2018 1156    INR    Component Value Date/Time   INR 1.0 06/19/2019 0629     Intake/Output Summary (Last 24 hours) at 06/20/2019 0824 Last data filed at 06/20/2019 0558 Gross per 24 hour  Intake 1691.53 ml  Output 1450 ml  Net 241.53 ml     Assessment/Plan:  79 y.o. female is s/p  1.  Redo exposure of the right common carotid artery 2.  Arch aortogram with right upper extremity arteriogram 3.  Ultrasound-guided access of the right brachial artery 4.  Retrograde innominate angioplasty with stent placement from carotid approach (8 mm x 29 mm VBX) 5.  Retrograde proximal right common carotid artery angioplasty with stent placement from carotid approach (6 mm x 29 mm VBX) 6.  Retrograde proximal right subclavian artery angioplasty with stent placement from brachial approach (7 mm x 29 mm VBX)  1 Day Post-Op    Neuro exam at baseline RUE well perfused with palpable radial OOB  today Nausea resolved this morning; regular diet ordered Possibly home tomorrow   Dagoberto Ligas, PA-C [facility] [phone] 06/20/2019 8:24 AM  I have seen and evaluated the patient. I agree with the PA note as documented above.  Nauseated last night but that has improved.  She is status post cutdown on the right common carotid artery with retrograde innominate and common carotid stent with subclavian stent from right brachial access.  She has bruising at her neck incision but overall is neurologically intact.  Will remain on aspirin Plavix.  Palpable right radial pulse as well.  We will keep her another day and hopefully home tomorrow.  Marty Heck, MD [facility] [place] Office: [phone]

## 2019-06-20 NOTE — Progress Notes (Signed)
Pt's A-line and foley removed. Pt tolerated well, we'll continue to monitor.

## 2019-06-21 MED ORDER — HYDROCODONE-ACETAMINOPHEN 5-325 MG PO TABS: 1.0000 | ORAL_TABLET | Freq: Four times a day (QID) | ORAL | 0 refills | Status: DC | PRN

## 2019-06-21 NOTE — Discharge Summary (Signed)
Discharge Summary     Caitlin Oliver Oct 13, 1940 79 y.o. female  [id number]  Admission Date: 06/19/2019  Discharge Date: 06/21/19  Physician: Marty Heck, MD  Admission Diagnosis: Carotid stenosis [I65.29]  Discharge Day services:   See progress note 06/21/2019  Hospital Course:  The patient was admitted to the hospital and taken to the operating room on 06/19/2019 and underwent right carotid exposure with common carotid and innominate artery stenting as well as subclavian artery stenting via brachial approach by Dr. Carlis Abbott.  She tolerated the procedure well and was admitted to the hospital postoperatively.  Her neuro exam remained at baseline throughout her hospital stay.  POD #1 involved postoperative pain control and increasing mobility.  POD #2 patient is ready for discharge home.  I should also be noted that she maintained a palpable right radial pulse throughout her hospital stay.  She will follow-up in office with Dr. Carlis Abbott in 3 to 4 weeks with a carotid and right upper extremity arterial duplex.  She will continue her aspirin and Plavix daily.  She will also be prescribed 1 to 2 days of narcotic pain medication for continued postoperative pain control.  She will be discharged home this morning in stable condition.   Recent Labs    06/19/19 0629 06/20/19 0525  NA 138 137  K 3.8 3.9  CL 97* 99  CO2 30 27  GLUCOSE 208* 166*  BUN 16 9  CALCIUM 9.5 8.7*   Recent Labs    06/19/19 0629 06/20/19 0525  WBC 6.5 7.3  HGB 11.5* 9.7*  HCT 38.2 32.1*  PLT 296 266   Recent Labs    06/19/19 0629  INR 1.0       Discharge Diagnosis:  Carotid stenosis [I65.29]  Secondary Diagnosis: Patient Active Problem List   Diagnosis Date Noted  . Acute on chronic heart failure with preserved ejection fraction (HFpEF) ([place])   . Unstable angina ([place])   . Acute on chronic combined systolic and diastolic CHF (congestive heart failure) ([place]) 11/13/2018  . Acute CVA  (cerebrovascular accident) ([place]) 06/17/2018  . Syncope 06/16/2018  . Chest pain 06/16/2018  . Pain 09/23/2017  . Cervicalgia 07/16/2017  . Pain in both upper extremities 06/03/2017  . Pain in both lower extremities 06/03/2017  . Bilateral carotid artery stenosis 06/03/2017  . Benign paroxysmal positional vertigo 10/23/2016  . Headache 06/19/2016  . Cancer of left lung ([place]) 02/03/2016  . COPD GOLD II 12/08/2015  . Solitary pulmonary nodule 12/08/2015  . Bilateral occipital neuralgia 11/22/2015  . Double vision 10/28/2015  . TIA (transient ischemic attack) 10/28/2015  . Subclavian artery stenosis, left ([place]) 10/28/2015  . Subclavian steal syndrome 10/28/2015  . Acute blood loss anemia 10/20/2013  . C. difficile colitis 10/19/2013  . GI bleed 10/18/2013  . COPD 10/18/2013  . Bleeding gastrointestinal 10/18/2013  . Hypoxia 10/10/2013  . Bronchitis 10/08/2013  . PVD (peripheral vascular disease) with claudication ([place]) 07/29/2013  . Carotid stenosis 05/07/2013  . Stenosis of left carotid artery 05/07/2013  . Occlusion and stenosis of carotid artery without mention of cerebral infarction 03/02/2013  . Carotid artery obstruction 03/02/2013  . Carotid artery disease ([place]) 02/27/2013  . Tobacco abuse 02/27/2013  . Compulsive tobacco user syndrome 02/27/2013  . Claudication ([place]) 01/20/2013  . Essential hypertension 01/20/2013  . Type 2 diabetes mellitus ([place]) 01/20/2013  . Hyperlipidemia 01/20/2013   Past Medical History:  Diagnosis Date  . Anxiety   . Arthritis   . Cancer ([place])  lung cancer  . Carotid artery disease ([place])   . Cataracts, bilateral   . CHF (congestive heart failure) ([place])   . Claudication ([place])   . COPD (chronic obstructive pulmonary disease) ([place])   . Diabetes mellitus   . Dizzy   . Family history of adverse reaction to anesthesia    Pt nephew has PONV  . GERD (gastroesophageal reflux disease)   . Headache   . History of kidney stones   .  Hypercholesteremia   . Hypertension   . 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 Pneumonia   . Stomach ulcer   . Stroke ([place])   . Tobacco abuse   . Wears glasses     Allergies as of 06/21/2019   No Known Allergies     Medication List    TAKE these medications   albuterol (2.5 MG/3ML) 0.083% nebulizer solution Commonly known as: PROVENTIL Take 2.5 mg by nebulization in the morning and at bedtime.   albuterol 108 (90 Base) MCG/ACT inhaler Commonly known as: VENTOLIN HFA Inhale 2 puffs into the lungs every 6 (six) hours as needed for wheezing or shortness of breath.   ALPRAZolam 0.5 MG tablet Commonly known as: XANAX Take 1-2 tablets 45 minutes before MRI, may take a 3rd if needed   aspirin 81 MG chewable tablet Chew 81 mg by mouth at bedtime.   budesonide 0.25 MG/2ML nebulizer solution Commonly known as: PULMICORT Take 2 mLs (0.25 mg total) by nebulization 2 (two) times daily.   Calcium 600 600 MG Tabs tablet Generic drug: calcium carbonate Take 600 mg by mouth daily before supper.   clopidogrel 75 MG tablet Commonly known as: PLAVIX Take 1 tablet (75 mg total) by mouth daily.   Fish Oil 1200 MG Caps Take 1,200 mg by mouth daily.   gabapentin 100 MG capsule Commonly known as: NEURONTIN Take 1 capsule (100 mg total) by mouth 3 (three) times daily. What changed:   how much to take  when to take this   HYDROcodone-acetaminophen 5-325 MG tablet Commonly known as: NORCO/VICODIN Take 1 tablet by mouth every 6 (six) hours as needed for moderate pain.   ICY HOT EX Apply 1 spray topically every 8 (eight) hours as needed (pain).   Magnesium 250 MG Tabs Take 250 mg by mouth daily.   meclizine 12.5 MG tablet Commonly known as: ANTIVERT Take 12.5 mg by mouth 3 (three) times daily as needed for dizziness or nausea.   metFORMIN 500 MG tablet Commonly known as: GLUCOPHAGE Take by mouth 2 (two) times daily with a  meal.   metoprolol tartrate 50 MG tablet Commonly known as: LOPRESSOR Take 50 mg by mouth 2 (two) times daily.   ondansetron 4 MG tablet Commonly known as: ZOFRAN Take 4 mg by mouth at bedtime as needed for nausea or vomiting.   ONE TOUCH ULTRA TEST test strip Generic drug: glucose blood   pantoprazole 40 MG tablet Commonly known as: PROTONIX Take 40 mg by mouth 2 (two) times daily.   polyethylene glycol 17 g packet Commonly known as: MIRALAX / GLYCOLAX Take 17 g by mouth at bedtime.   potassium chloride SA 20 MEQ tablet Commonly known as: KLOR-CON Take 1 tablet (20 mEq total) by mouth daily. What changed: when to take this   pravastatin 40 MG tablet Commonly known as: PRAVACHOL Take 20 mg by mouth daily before supper.   torsemide 20 MG tablet Commonly known as: DEMADEX Take  2 tablets (40 mg total) by mouth 2 (two) times daily.   traZODone 50 MG tablet Commonly known as: DESYREL Take 50 mg by mouth at bedtime.   Vitamin B-12 2500 MCG Subl Take 2,500 mcg by mouth daily.        Discharge Instructions:   [facility] Discharge Instructions Carotid Endarterectomy (CEA)  Please refer to the following instructions for your post-procedure care. Your surgeon or physician assistant will discuss any changes with you.  Activity  You are encouraged to walk as much as you can. You can slowly return to normal activities but must avoid strenuous activity and heavy lifting until your doctor tell you it's OK. Avoid activities such as vacuuming or swinging a golf club. You can drive after one week if you are comfortable and you are no longer taking prescription pain medications. It is normal to feel tired for serval weeks after your surgery. It is also normal to have difficulty with sleep habits, eating, and bowel movements after surgery. These will go away with time.  Bathing/Showering  You may shower after you come home. Do not soak in a  bathtub, hot tub, or swim until the incision heals completely.  Incision Care  Shower every day. Clean your incision with mild soap and water. Pat the area dry with a clean towel. You do not need a bandage unless otherwise instructed. Do not apply any ointments or creams to your incision. You may have skin glue on your incision. Do not peel it off. It will come off on its own in about one week. Your incision may feel thickened and raised for several weeks after your surgery. This is normal and the skin will soften over time. For Men Only: It's OK to shave around the incision but do not shave the incision itself for 2 weeks. It is common to have numbness under your chin that could last for several months.  Diet  Resume your normal diet. There are no special food restrictions following this procedure. A low fat/low cholesterol diet is recommended for all patients with vascular disease. In order to heal from your surgery, it is CRITICAL to get adequate nutrition. Your body requires vitamins, minerals, and protein. Vegetables are the best source of vitamins and minerals. Vegetables also provide the perfect balance of protein. Processed food has little nutritional value, so try to avoid this.  Medications  Resume taking all of your medications unless your doctor or physician assistant tells you not to.  If your incision is causing pain, you may take over-the- counter pain relievers such as acetaminophen (Tylenol). If you were prescribed a stronger pain medication, please be aware these medications can cause nausea and constipation.  Prevent nausea by taking the medication with a snack or meal. Avoid constipation by drinking plenty of fluids and eating foods with a high amount of fiber, such as fruits, vegetables, and grains. Do not take Tylenol if you are taking prescription pain medications.  Follow Up  Our office will schedule a follow up appointment 2-3 weeks following discharge.  Please call us  immediately for any of the following conditions  . Increased pain, redness, drainage (pus) from your incision site. . Fever of 101 degrees or higher. . If you should develop stroke (slurred speech, difficulty swallowing, weakness on one side of your body, loss of vision) you should call 911 and go to the nearest emergency room. .  Reduce your risk of vascular disease:  . Stop smoking.  If you would like help call [facility] at 1-800-QUIT-NOW 407 543 4410) or [place] at (816)208-7863. . Manage your cholesterol . Maintain a desired weight . Control your diabetes . Keep your blood pressure down .  If you have any questions, please call the office at [phone].  Disposition: home  Patient's condition: is Good  Follow up: 1. Dr. Carlis Abbott in 3-4 weeks.   Dagoberto Ligas, PA-C [facility] [phone]   --- For [facility] Registry use ---   Modified Rankin score at D/C (0-6): 0  IV medication needed for:  1. Hypertension: No 2. Hypotension: No  Post-op Complications: No  1. Post-op CVA or TIA: No  If yes: Event classification (right eye, left eye, right cortical, left cortical, verterobasilar, other):   If yes: Timing of event (intra-op, <6 hrs post-op, >=6 hrs post-op, unknown):   2. CN injury: No  If yes: CN  injuried   3. Myocardial infarction: No  If yes: Dx by (EKG or clinical, Troponin):   4.  CHF: No  5.  Dysrhythmia (new): No  6. Wound infection: No  7. Reperfusion symptoms: No  8. Return to OR: No  If yes: return to OR for (bleeding, neurologic, other CEA incision, other):   Discharge medications: Statin use:  No ASA use:  Yes   Beta blocker use:  Yes ACE-Inhibitor use:  No  ARB use:  No CCB use: No P2Y12 Antagonist use: Yes, [x ] Plavix, _0  Plasugrel, _1  Ticlopinine, _2  Ticagrelor, _3  Other, _4  No for medical reason, _5  Non-compliant, _6  Not-indicated Anti-coagulant use:  No, _7  Warfarin, _8  Rivaroxaban, _9   Dabigatran,

## 2019-06-21 NOTE — Discharge Instructions (Signed)
   [facility]  Discharge Instructions   Carotid Endarterectomy (CEA)  Please refer to the following instructions for your post-procedure care. Your surgeon or physician assistant will discuss any changes with you.  Activity  You are encouraged to walk as much as you can. You can slowly return to normal activities but must avoid strenuous activity and heavy lifting until your doctor tell you it's OK. Avoid activities such as vacuuming or swinging a golf club. You can drive after one week if you are comfortable and you are no longer taking prescription pain medications. It is normal to feel tired for serval weeks after your surgery. It is also normal to have difficulty with sleep habits, eating, and bowel movements after surgery. These will go away with time.  Bathing/Showering  You may shower after you come home. Do not soak in a bathtub, hot tub, or swim until the incision heals completely.  Incision Care  Shower every day. Clean your incision with mild soap and water. Pat the area dry with a clean towel. You do not need a bandage unless otherwise instructed. Do not apply any ointments or creams to your incision. You may have skin glue on your incision. Do not peel it off. It will come off on its own in about one week. Your incision may feel thickened and raised for several weeks after your surgery. This is normal and the skin will soften over time. For Men Only: It's OK to shave around the incision but do not shave the incision itself for 2 weeks. It is common to have numbness under your chin that could last for several months.  Diet  Resume your normal diet. There are no special food restrictions following this procedure. A low fat/low cholesterol diet is recommended for all patients with vascular disease. In order to heal from your surgery, it is CRITICAL to get adequate nutrition. Your body requires vitamins, minerals, and protein. Vegetables are the best  source of vitamins and minerals. Vegetables also provide the perfect balance of protein. Processed food has little nutritional value, so try to avoid this.        Medications  Resume taking all of your medications unless your doctor or physician assistant tells you not to. If your incision is causing pain, you may take over-the- counter pain relievers such as acetaminophen (Tylenol). If you were prescribed a stronger pain medication, please be aware these medications can cause nausea and constipation. Prevent nausea by taking the medication with a snack or meal. Avoid constipation by drinking plenty of fluids and eating foods with a high amount of fiber, such as fruits, vegetables, and grains. Do not take Tylenol if you are taking prescription pain medications.  Follow Up  Our office will schedule a follow up appointment 2-3 weeks following discharge.  Please call us immediately for any of the following conditions  Increased pain, redness, drainage (pus) from your incision site. Fever of 101 degrees or higher. If you should develop stroke (slurred speech, difficulty swallowing, weakness on one side of your body, loss of vision) you should call 911 and go to the nearest emergency room.  Reduce your risk of vascular disease:  Stop smoking. If you would like help call [facility] at 1-800-QUIT-NOW 973-769-6662) or [place] at (217)433-5446. Manage your cholesterol Maintain a desired weight Control your diabetes Keep your blood pressure down  If you have any questions, please call the office at [phone].

## 2019-06-21 NOTE — Progress Notes (Signed)
Pt discharged today to home with family.  Pt's IV removed. Pt taken off telemetry and CCMD notified.  Pt left with all of their personal belongings.  AVS documentation reviewed with Pt and all questions answered.

## 2019-06-21 NOTE — Progress Notes (Addendum)
Progress Note    06/21/2019 8:16 AM 2 Days Post-Op  Subjective:  Ready for discharge home today.  No further nausea.     Vitals:   06/21/19 0012 06/21/19 0400  BP: 133/73 101/74  Pulse: 71 93  Resp: 13 19  Temp: 97.6 F (36.4 C) 98.8 F (37.1 C)  SpO2: 99% 92%   Physical Exam: Lungs:  Non labored Incisions:  R neck incision ecchymosis but soft; R brachial stick without firm hematoma; palpable R radial pulse Neurologic: CN grossly intact  CBC    Component Value Date/Time   WBC 7.3 06/20/2019 0525   RBC 3.64 (L) 06/20/2019 0525   HGB 9.7 (L) 06/20/2019 0525   HGB 15.1 (H) 03/10/2018 1156   HGB 16.0 (H) 06/03/2017 1414   HGB 16.0 (H) 03/12/2017 1121   HCT 32.1 (L) 06/20/2019 0525   HCT 47.5 (H) 06/03/2017 1414   HCT 48.0 (H) 03/12/2017 1121   PLT 266 06/20/2019 0525   PLT 275 03/10/2018 1156   PLT 269 06/03/2017 1414   MCV 88.2 06/20/2019 0525   MCV 90 06/03/2017 1414   MCV 88.4 03/12/2017 1121   MCH 26.6 06/20/2019 0525   MCHC 30.2 06/20/2019 0525   RDW 13.1 06/20/2019 0525   RDW 13.2 06/03/2017 1414   RDW 12.7 03/12/2017 1121   LYMPHSABS 1.0 11/21/2018 0525   LYMPHSABS 1.7 03/12/2017 1121   MONOABS 0.6 11/21/2018 0525   MONOABS 0.6 03/12/2017 1121   EOSABS 0.4 11/21/2018 0525   EOSABS 0.1 03/12/2017 1121   BASOSABS 0.0 11/21/2018 0525   BASOSABS 0.1 03/12/2017 1121    BMET    Component Value Date/Time   NA 137 06/20/2019 0525   NA 138 06/03/2017 1414   NA 138 03/12/2017 1121   K 3.9 06/20/2019 0525   K 4.6 03/12/2017 1121   CL 99 06/20/2019 0525   CO2 27 06/20/2019 0525   CO2 27 03/12/2017 1121   GLUCOSE 166 (H) 06/20/2019 0525   GLUCOSE 148 (H) 03/12/2017 1121   BUN 9 06/20/2019 0525   BUN 20 06/03/2017 1414   BUN 19.6 03/12/2017 1121   CREATININE 0.75 06/20/2019 0525   CREATININE 0.78 03/10/2018 1156   CREATININE 0.9 03/12/2017 1121   CALCIUM 8.7 (L) 06/20/2019 0525   CALCIUM 9.6 03/12/2017 1121   GFRNONAA >60 06/20/2019 0525   GFRNONAA >60 03/10/2018 1156   GFRAA >60 06/20/2019 0525   GFRAA >60 03/10/2018 1156    INR    Component Value Date/Time   INR 1.0 06/19/2019 0629     Intake/Output Summary (Last 24 hours) at 06/21/2019 0816 Last data filed at 06/21/2019 0128 Gross per 24 hour  Intake 480 ml  Output 680 ml  Net -200 ml     Assessment/Plan:  79 y.o. female is s/p   1. Redo exposure of the right common carotid artery 2. Arch aortogram with right upper extremity arteriogram 3. Ultrasound-guided access of the right brachial artery 4. Retrograde innominate angioplasty with stent placement from carotid approach(8 mm x 29 mm VBX) 5. Retrograde proximalright common carotid artery angioplasty with stent placementfrom carotid approach(32m x 29 mm VBX) 6. Retrograde proximal right subclavian artery angioplasty with stent placement from brachial approach(7 mm x 29 mm VBX) 2 Days Post-Op    Neuro exam at baseline Palpable radial pulse D/c home this morning on asa and plavix   MDagoberto Ligas PA-C [facility] [phone] 52903/10/2019 8:16 AM  I have seen and evaluated the patient. I agree  with the PA note as documented above.  Postop day 2 status post retrograde innominate and right common carotid stent with retrograde right subclavian stent from the right brachial.  She states her dizziness is better.  Has a good palpable radial pulse at the right wrist.  Neurologically intact.  Has some bruising at the neck and arm.  Plan for discharge today on aspirin Plavix.  Will arrange follow-up in 1 month with carotid duplex and right upper extremity duplex.  Patient feels she is ready to go.  Marty Heck, MD [facility] [place] Office: [phone]

## 2019-06-22 ENCOUNTER — Encounter: Payer: Self-pay | Admitting: *Deleted

## 2019-06-25 DIAGNOSIS — I509 Heart failure, unspecified: Secondary | ICD-10-CM | POA: Diagnosis not present

## 2019-06-26 DIAGNOSIS — R0602 Shortness of breath: Secondary | ICD-10-CM | POA: Diagnosis not present

## 2019-06-28 DIAGNOSIS — Z85118 Personal history of other malignant neoplasm of bronchus and lung: Secondary | ICD-10-CM | POA: Diagnosis not present

## 2019-06-28 DIAGNOSIS — I69398 Other sequelae of cerebral infarction: Secondary | ICD-10-CM | POA: Diagnosis not present

## 2019-06-28 DIAGNOSIS — I272 Pulmonary hypertension, unspecified: Secondary | ICD-10-CM | POA: Diagnosis not present

## 2019-06-28 DIAGNOSIS — J449 Chronic obstructive pulmonary disease, unspecified: Secondary | ICD-10-CM | POA: Diagnosis not present

## 2019-06-29 ENCOUNTER — Other Ambulatory Visit: Payer: Self-pay | Admitting: Physician Assistant

## 2019-07-03 DIAGNOSIS — R109 Unspecified abdominal pain: Secondary | ICD-10-CM | POA: Diagnosis not present

## 2019-07-10 ENCOUNTER — Telehealth (HOSPITAL_COMMUNITY): Payer: Self-pay

## 2019-07-10 NOTE — Telephone Encounter (Signed)
The above patient or their representative was contacted and gave the following answers to these questions:         Do you have any of the following symptoms?    NO  Fever                    Cough                   Shortness of breath  Do  you have any of the following other symptoms?    muscle pain         vomiting,        diarrhea        rash         weakness        red eye        abdominal pain         bruising          bruising or bleeding              joint pain           severe headache    Have you been in contact with someone who was or has been sick in the past 2 weeks?  NO  Yes                 Unsure                         Unable to assess   Does the person that you were in contact with have any of the following symptoms?   Cough         shortness of breath           muscle pain         vomiting,            diarrhea            rash            weakness           fever            red eye           abdominal pain           bruising  or  bleeding                joint pain                severe headache                 COMMENTS OR ACTION PLAN FOR THIS PATIENT:        ALL QUESTIONS WERE ANSWERED/[facility]

## 2019-07-13 ENCOUNTER — Other Ambulatory Visit: Payer: Self-pay | Admitting: *Deleted

## 2019-07-13 DIAGNOSIS — I6523 Occlusion and stenosis of bilateral carotid arteries: Secondary | ICD-10-CM

## 2019-07-13 DIAGNOSIS — I771 Stricture of artery: Secondary | ICD-10-CM

## 2019-07-14 ENCOUNTER — Ambulatory Visit (HOSPITAL_COMMUNITY)
Admission: RE | Admit: 2019-07-14 | Discharge: 2019-07-14 | Disposition: A | Discharge status: Home / Self Care | Payer: PPO | Source: Ambulatory Visit | Attending: Vascular Surgery | Admitting: Vascular Surgery

## 2019-07-14 ENCOUNTER — Ambulatory Visit (INDEPENDENT_AMBULATORY_CARE_PROVIDER_SITE_OTHER)
Admission: RE | Admit: 2019-07-14 | Discharge: 2019-07-14 | Disposition: A | Discharge status: Home / Self Care | Payer: PPO | Source: Ambulatory Visit | Attending: Vascular Surgery | Admitting: Vascular Surgery

## 2019-07-14 ENCOUNTER — Ambulatory Visit (INDEPENDENT_AMBULATORY_CARE_PROVIDER_SITE_OTHER): Payer: Self-pay | Admitting: Vascular Surgery

## 2019-07-14 ENCOUNTER — Other Ambulatory Visit: Payer: Self-pay

## 2019-07-14 ENCOUNTER — Encounter: Payer: Self-pay | Admitting: Vascular Surgery

## 2019-07-14 VITALS — BP 112/62 | HR 78 | Temp 97.7°F | Resp 16 | Ht 64.0 in | Wt 171.0 lb

## 2019-07-14 DIAGNOSIS — I6523 Occlusion and stenosis of bilateral carotid arteries: Secondary | ICD-10-CM

## 2019-07-14 DIAGNOSIS — I771 Stricture of artery: Secondary | ICD-10-CM | POA: Diagnosis not present

## 2019-07-14 DIAGNOSIS — G458 Other transient cerebral ischemic attacks and related syndromes: Secondary | ICD-10-CM

## 2019-07-14 NOTE — Progress Notes (Signed)
Patient name: Caitlin Oliver MRN: [id number] DOB: 05-08-1940 Sex: female  REASON FOR VISIT: Follow-up  HPI: Caitlin Oliver is a 79 y.o. female with multiple medical problems as noted below that presents for hospital follow-up after complex endovascular revascularization on 06/19/2019.  As previously noted she underwent redo right common carotid artery exposure and retrograde stenting of her innominate and proximal common high-grade stenosis with kissing stent into the right subclavian.  This was done for suspected vertebrobasilar insufficiency and reversed flow in the right vertebral artery.  She states her dizziness is about 70% better and overall she is making good progress.  The right arm as well as her neck incision have healed without issue.  She remains on aspirin Plavix.  Walking with a walker.  Past Medical History:  Diagnosis Date  . Anxiety   . Arthritis   . Cancer ([place])    lung cancer  . Carotid artery disease ([place])   . Cataracts, bilateral   . CHF (congestive heart failure) ([place])   . Claudication ([place])   . COPD (chronic obstructive pulmonary disease) ([place])   . Diabetes mellitus   . Dizzy   . Family history of adverse reaction to anesthesia    Pt nephew has PONV  . GERD (gastroesophageal reflux disease)   . Headache   . History of kidney stones   . Hypercholesteremia   . Hypertension   . 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 Pneumonia   . Stomach ulcer   . Stroke ([place])   . Tobacco abuse   . Wears glasses     Past Surgical History:  Procedure Laterality Date  . ABDOMINAL AORTIC ENDOVASCULAR STENT GRAFT Right 06/19/2019   Procedure: INNOMINATE STENT, RIGHT CAROTID STENT, RIGHT SUBCLAVIAN STENT AND CAROTID CUTDOWN;  Surgeon: Marty Heck, MD;  Location: [place];  Service: Vascular;  Laterality: Right;  . ABDOMINAL HYSTERECTOMY    . APPENDECTOMY    . BREAST REDUCTION SURGERY    . CAROTID ANGIOGRAM N/A 02/25/2013    Procedure: CAROTID ANGIOGRAM;  Surgeon: Lorretta Harp, MD;  Location: [facility] CATH LAB;  Service: Cardiovascular;  Laterality: N/A;  . ENDARTERECTOMY Right 03/06/2013   Procedure: ENDARTERECTOMY CAROTID-RIGHT;  Surgeon: Serafina Mitchell, MD;  Location: [place];  Service: Vascular;  Laterality: Right;  . ENDARTERECTOMY Left 05/07/2013   Procedure: LEFT CAROTID ARTERY ENDARTERECTOMY WITH VASCU-GUARD PATCH ANGIOPLASTY ;  Surgeon: Serafina Mitchell, MD;  Location: [place];  Service: Vascular;  Laterality: Left;  . INTRAOPERATIVE ARTERIOGRAM N/A 06/19/2019   Procedure: Clydie Braun;  Surgeon: Marty Heck, MD;  Location: [facility] OR;  Service: Vascular;  Laterality: N/A;  . IR ANGIO INTRA EXTRACRAN SEL COM CAROTID INNOMINATE UNI R MOD SED  05/14/2019  . IR ANGIO VERTEBRAL SEL SUBCLAVIAN INNOMINATE BILAT MOD SED  05/14/2019  . LOBECTOMY Left 02/03/2016   Procedure: LEFT UPPER LOBECTOMY;  Surgeon: [name], MD;  Location: [place];  Service: Thoracic;  Laterality: Left;  . LOWER EXTREMITY ANGIOGRAM N/A 02/25/2013   Procedure: LOWER EXTREMITY ANGIOGRAM;  Surgeon: Lorretta Harp, MD;  Location: [facility] CATH LAB;  Service: Cardiovascular;  Laterality: N/A;  . LOWER EXTREMITY ANGIOGRAM N/A 07/27/2013   Procedure: LOWER EXTREMITY ANGIOGRAM;  Surgeon: Lorretta Harp, MD;  Location: [facility] CATH LAB;  Service: Cardiovascular;  Laterality: N/A;  . PATCH ANGIOPLASTY Right 03/06/2013   Procedure: PATCH ANGIOPLASTY of Right Carotid Artery using Vascu-Guard Patch;  Surgeon: Butch Penny  Trula Slade, MD;  Location: [place];  Service: Vascular;  Laterality: Right;  . PERIPHERAL VASCULAR CATHETERIZATION N/A 11/15/2015   Procedure: Aortic Arch Angiography;  Surgeon: Serafina Mitchell, MD;  Location: [place] CV LAB;  Service: Cardiovascular;  Laterality: N/A;  . PERIPHERAL VASCULAR CATHETERIZATION Bilateral 11/15/2015   Procedure: Carotid Angiography;  Surgeon: Serafina Mitchell, MD;  Location: [place] CV LAB;  Service: Cardiovascular;   Laterality: Bilateral;  . PERIPHERAL VASCULAR CATHETERIZATION Left 11/15/2015   Procedure: Upper Extremity Angiography;  Surgeon: Serafina Mitchell, MD;  Location: [place] CV LAB;  Service: Cardiovascular;  Laterality: Left;  . PERIPHERAL VASCULAR CATHETERIZATION Left 11/15/2015   Procedure: Peripheral Vascular Intervention;  Surgeon: Serafina Mitchell, MD;  Location: [place] CV LAB;  Service: Cardiovascular;  Laterality: Left;  subclavian   . RIGHT HEART CATH N/A 06/16/2019   Procedure: RIGHT HEART CATH;  Surgeon: Jolaine Artist, MD;  Location: [place] CV LAB;  Service: Cardiovascular;  Laterality: N/A;  . RIGHT/LEFT HEART CATH AND CORONARY ANGIOGRAPHY N/A 11/20/2018   Procedure: RIGHT/LEFT HEART CATH AND CORONARY ANGIOGRAPHY;  Surgeon: Nelva Bush, MD;  Location: [place] CV LAB;  Service: Cardiovascular;  Laterality: N/A;  . SALIVARY GLAND SURGERY     scar tissue removed from left saliva glad  . TUBAL LIGATION    . ULTRASOUND GUIDANCE FOR VASCULAR ACCESS Right 06/19/2019   Procedure: Ultrasound Guidance For Vascular Access;  Surgeon: Marty Heck, MD;  Location: [place];  Service: Vascular;  Laterality: Right;  [name] VIDEO ASSISTED THORACOSCOPY (VATS)/WEDGE RESECTION Left 02/03/2016   Procedure: VIDEO ASSISTED THORACOSCOPY;  Surgeon: [name], MD;  Location: [place];  Service: Thoracic;  Laterality: Left;    Family History  Problem Relation Age of Onset  . Stroke Mother   . Hypertension Mother   . Kidney failure Father     SOCIAL HISTORY: Social History   Tobacco Use  . Smoking status: Former Smoker    Packs/day: 1.50    Years: 56.00    Pack years: 84.00    Types: Cigarettes    Quit date: 07/07/2013    Years since quitting: 6.0  . Smokeless tobacco: Former Systems developer    Quit date: 02/25/2013  Substance Use Topics  . Alcohol use: No    No Known Allergies  Current Outpatient Medications  Medication Sig Dispense Refill  . albuterol (PROVENTIL HFA;VENTOLIN  HFA) 108 (90 Base) MCG/ACT inhaler Inhale 2 puffs into the lungs every 6 (six) hours as needed for wheezing or shortness of breath.     [name] albuterol (PROVENTIL) (2.5 MG/3ML) 0.083% nebulizer solution Take 2.5 mg by nebulization in the morning and at bedtime.    [name] aspirin 81 MG chewable tablet Chew 81 mg by mouth at bedtime.     . budesonide (PULMICORT) 0.25 MG/2ML nebulizer solution Take 2 mLs (0.25 mg total) by nebulization 2 (two) times daily. 60 mL 12  . calcium carbonate (CALCIUM 600) 600 MG TABS tablet Take 600 mg by mouth daily before supper.     . clopidogrel (PLAVIX) 75 MG tablet Take 1 tablet (75 mg total) by mouth daily.    . Cyanocobalamin (VITAMIN B-12) 2500 MCG SUBL Take 2,500 mcg by mouth daily.    [name] gabapentin (NEURONTIN) 100 MG capsule Take 1 capsule (100 mg total) by mouth 3 (three) times daily. (Patient taking differently: Take 200 mg by mouth in the morning and at bedtime. ) 90 capsule 0  . HYDROcodone-acetaminophen (NORCO/VICODIN) 5-325 MG tablet  Take 1 tablet by mouth every 6 (six) hours as needed for moderate pain. 15 tablet 0  . Magnesium 250 MG TABS Take 250 mg by mouth daily.    . meclizine (ANTIVERT) 12.5 MG tablet Take 12.5 mg by mouth 3 (three) times daily as needed for dizziness or nausea.     . Menthol, Topical Analgesic, (ICY HOT EX) Apply 1 spray topically every 8 (eight) hours as needed (pain).    . metFORMIN (GLUCOPHAGE) 500 MG tablet Take by mouth 2 (two) times daily with a meal.    . metoprolol tartrate (LOPRESSOR) 50 MG tablet Take 50 mg by mouth 2 (two) times daily.    . Omega-3 Fatty Acids (FISH OIL) 1200 MG CAPS Take 1,200 mg by mouth daily.    . ondansetron (ZOFRAN) 4 MG tablet Take 4 mg by mouth at bedtime as needed for nausea or vomiting.     . ONE TOUCH ULTRA TEST test strip     . pantoprazole (PROTONIX) 40 MG tablet Take 40 mg by mouth 2 (two) times daily.     . polyethylene glycol (MIRALAX / GLYCOLAX) 17 g packet Take 17 g by mouth at bedtime.     .  potassium chloride SA (KLOR-CON) 20 MEQ tablet Take 1 tablet (20 mEq total) by mouth daily. (Patient taking differently: Take 20 mEq by mouth daily with supper. ) 90 tablet 3  . pravastatin (PRAVACHOL) 40 MG tablet Take 20 mg by mouth daily before supper.     . torsemide (DEMADEX) 20 MG tablet Take 2 tablets (40 mg total) by mouth 2 (two) times daily. 360 tablet 3  . traZODone (DESYREL) 50 MG tablet Take 50 mg by mouth at bedtime.    . ALPRAZolam (XANAX) 0.5 MG tablet Take 1-2 tablets 45 minutes before MRI, may take a 3rd if needed (Patient not taking: Reported on 07/14/2019) 3 tablet 0   No current facility-administered medications for this visit.    REVIEW OF SYSTEMS:  _0  denotes positive finding, _1  denotes negative finding Cardiac  Comments:  Chest pain or chest pressure:    Shortness of breath upon exertion:    Short of breath when lying flat:    Irregular heart rhythm:        Vascular    Pain in calf, thigh, or hip brought on by ambulation:    Pain in feet at night that wakes you up from your sleep:     Blood clot in your veins:    Leg swelling:         Pulmonary    Oxygen at home:    Productive cough:     Wheezing:         Neurologic    Sudden weakness in arms or legs:     Sudden numbness in arms or legs:     Sudden onset of difficulty speaking or slurred speech:    Temporary loss of vision in one eye:     Problems with dizziness:         Gastrointestinal    Blood in stool:     Vomited blood:         Genitourinary    Burning when urinating:     Blood in urine:        Psychiatric    Major depression:         Hematologic    Bleeding problems:    Problems with blood clotting too easily:  Skin    Rashes or ulcers:        Constitutional    Fever or chills:      PHYSICAL EXAM: Vitals:   07/14/19 1518 07/14/19 1523  BP: 113/62 112/62  Pulse: 78 78  Resp: 16   Temp: 97.7 F (36.5 C)   TempSrc: Temporal   SpO2: 100%   Weight: 171 lb (77.6 kg)     Height: _0  (1.626 m)     GENERAL: The patient is a well-nourished female, in no acute distress. The vital signs are documented above. CARDIAC: There is a regular rate and rhythm.  VASCULAR:  Palpable right radial and brachial pulse Right neck and arm incision well-healed  DATA:   I reviewed her duplex today and she has normal waveforms in the right upper extremity with a patent right subclavian stent.  Difficult to visualize the right innominate and common carotid stents but normal waveforms distally.  She has antegrade flow in the right vertebral artery now and previously this was retrograde.  Assessment/Plan:  79 year old female status post retrograde innominate and proximal right common carotid artery stenting for high-grade stenosis with kissing stent in the right subclavian for vertebrobasilar insufficiency.  She is doing great and ultimately about 70% better from a dizziness standpoint.  Her stents look good on duplex today.  I will have her follow-up again in 6 months with ongoing surveillance with carotid duplex and right upper extremity arterial duplex.  Remain on aspirin and Plavix.  Call with questions or concerns.   Marty Heck, MD [facility] Office: [phone]

## 2019-07-15 ENCOUNTER — Other Ambulatory Visit: Payer: Self-pay | Admitting: *Deleted

## 2019-07-15 DIAGNOSIS — Z9889 Other specified postprocedural states: Secondary | ICD-10-CM

## 2019-07-15 DIAGNOSIS — G458 Other transient cerebral ischemic attacks and related syndromes: Secondary | ICD-10-CM

## 2019-07-29 DIAGNOSIS — Z85118 Personal history of other malignant neoplasm of bronchus and lung: Secondary | ICD-10-CM | POA: Diagnosis not present

## 2019-07-29 DIAGNOSIS — J449 Chronic obstructive pulmonary disease, unspecified: Secondary | ICD-10-CM | POA: Diagnosis not present

## 2019-07-29 DIAGNOSIS — I69398 Other sequelae of cerebral infarction: Secondary | ICD-10-CM | POA: Diagnosis not present

## 2019-07-29 DIAGNOSIS — I272 Pulmonary hypertension, unspecified: Secondary | ICD-10-CM | POA: Diagnosis not present

## 2019-07-30 ENCOUNTER — Ambulatory Visit: Payer: PPO | Admitting: Neurology

## 2019-08-05 DIAGNOSIS — R109 Unspecified abdominal pain: Secondary | ICD-10-CM | POA: Diagnosis not present

## 2019-08-05 DIAGNOSIS — R140 Abdominal distension (gaseous): Secondary | ICD-10-CM | POA: Diagnosis not present

## 2019-08-05 DIAGNOSIS — D649 Anemia, unspecified: Secondary | ICD-10-CM | POA: Diagnosis not present

## 2019-08-05 DIAGNOSIS — K5900 Constipation, unspecified: Secondary | ICD-10-CM | POA: Diagnosis not present

## 2019-08-06 DIAGNOSIS — K219 Gastro-esophageal reflux disease without esophagitis: Secondary | ICD-10-CM | POA: Diagnosis not present

## 2019-08-06 DIAGNOSIS — Z87891 Personal history of nicotine dependence: Secondary | ICD-10-CM | POA: Diagnosis not present

## 2019-08-06 DIAGNOSIS — D649 Anemia, unspecified: Secondary | ICD-10-CM | POA: Diagnosis not present

## 2019-08-06 DIAGNOSIS — Z7902 Long term (current) use of antithrombotics/antiplatelets: Secondary | ICD-10-CM | POA: Diagnosis not present

## 2019-08-06 DIAGNOSIS — Z7982 Long term (current) use of aspirin: Secondary | ICD-10-CM | POA: Diagnosis not present

## 2019-08-06 DIAGNOSIS — E7800 Pure hypercholesterolemia, unspecified: Secondary | ICD-10-CM | POA: Diagnosis not present

## 2019-08-06 DIAGNOSIS — I110 Hypertensive heart disease with heart failure: Secondary | ICD-10-CM | POA: Diagnosis not present

## 2019-08-06 DIAGNOSIS — I272 Pulmonary hypertension, unspecified: Secondary | ICD-10-CM | POA: Diagnosis not present

## 2019-08-06 DIAGNOSIS — H6123 Impacted cerumen, bilateral: Secondary | ICD-10-CM | POA: Diagnosis not present

## 2019-08-06 DIAGNOSIS — H9193 Unspecified hearing loss, bilateral: Secondary | ICD-10-CM | POA: Diagnosis not present

## 2019-08-06 DIAGNOSIS — Z8673 Personal history of transient ischemic attack (TIA), and cerebral infarction without residual deficits: Secondary | ICD-10-CM | POA: Diagnosis not present

## 2019-08-06 DIAGNOSIS — Z85118 Personal history of other malignant neoplasm of bronchus and lung: Secondary | ICD-10-CM | POA: Diagnosis not present

## 2019-08-06 DIAGNOSIS — E1151 Type 2 diabetes mellitus with diabetic peripheral angiopathy without gangrene: Secondary | ICD-10-CM | POA: Diagnosis not present

## 2019-08-06 DIAGNOSIS — M5481 Occipital neuralgia: Secondary | ICD-10-CM | POA: Diagnosis not present

## 2019-08-06 DIAGNOSIS — J449 Chronic obstructive pulmonary disease, unspecified: Secondary | ICD-10-CM | POA: Diagnosis not present

## 2019-08-06 DIAGNOSIS — I5032 Chronic diastolic (congestive) heart failure: Secondary | ICD-10-CM | POA: Diagnosis not present

## 2019-08-06 DIAGNOSIS — F419 Anxiety disorder, unspecified: Secondary | ICD-10-CM | POA: Diagnosis not present

## 2019-08-06 DIAGNOSIS — Z9181 History of falling: Secondary | ICD-10-CM | POA: Diagnosis not present

## 2019-08-06 DIAGNOSIS — F329 Major depressive disorder, single episode, unspecified: Secondary | ICD-10-CM | POA: Diagnosis not present

## 2019-08-06 DIAGNOSIS — Z7984 Long term (current) use of oral hypoglycemic drugs: Secondary | ICD-10-CM | POA: Diagnosis not present

## 2019-08-07 DIAGNOSIS — R109 Unspecified abdominal pain: Secondary | ICD-10-CM | POA: Diagnosis not present

## 2019-08-07 DIAGNOSIS — D649 Anemia, unspecified: Secondary | ICD-10-CM | POA: Diagnosis not present

## 2019-08-07 DIAGNOSIS — K5900 Constipation, unspecified: Secondary | ICD-10-CM | POA: Diagnosis not present

## 2019-08-07 DIAGNOSIS — R140 Abdominal distension (gaseous): Secondary | ICD-10-CM | POA: Diagnosis not present

## 2019-08-11 DIAGNOSIS — Z7902 Long term (current) use of antithrombotics/antiplatelets: Secondary | ICD-10-CM | POA: Diagnosis not present

## 2019-08-11 DIAGNOSIS — Z7982 Long term (current) use of aspirin: Secondary | ICD-10-CM | POA: Diagnosis not present

## 2019-08-11 DIAGNOSIS — H9193 Unspecified hearing loss, bilateral: Secondary | ICD-10-CM | POA: Diagnosis not present

## 2019-08-11 DIAGNOSIS — I272 Pulmonary hypertension, unspecified: Secondary | ICD-10-CM | POA: Diagnosis not present

## 2019-08-11 DIAGNOSIS — Z85118 Personal history of other malignant neoplasm of bronchus and lung: Secondary | ICD-10-CM | POA: Diagnosis not present

## 2019-08-11 DIAGNOSIS — F329 Major depressive disorder, single episode, unspecified: Secondary | ICD-10-CM | POA: Diagnosis not present

## 2019-08-11 DIAGNOSIS — K219 Gastro-esophageal reflux disease without esophagitis: Secondary | ICD-10-CM | POA: Diagnosis not present

## 2019-08-11 DIAGNOSIS — D649 Anemia, unspecified: Secondary | ICD-10-CM | POA: Diagnosis not present

## 2019-08-11 DIAGNOSIS — J449 Chronic obstructive pulmonary disease, unspecified: Secondary | ICD-10-CM | POA: Diagnosis not present

## 2019-08-11 DIAGNOSIS — M5481 Occipital neuralgia: Secondary | ICD-10-CM | POA: Diagnosis not present

## 2019-08-11 DIAGNOSIS — Z8673 Personal history of transient ischemic attack (TIA), and cerebral infarction without residual deficits: Secondary | ICD-10-CM | POA: Diagnosis not present

## 2019-08-11 DIAGNOSIS — H6123 Impacted cerumen, bilateral: Secondary | ICD-10-CM | POA: Diagnosis not present

## 2019-08-11 DIAGNOSIS — Z7984 Long term (current) use of oral hypoglycemic drugs: Secondary | ICD-10-CM | POA: Diagnosis not present

## 2019-08-11 DIAGNOSIS — E1151 Type 2 diabetes mellitus with diabetic peripheral angiopathy without gangrene: Secondary | ICD-10-CM | POA: Diagnosis not present

## 2019-08-11 DIAGNOSIS — I110 Hypertensive heart disease with heart failure: Secondary | ICD-10-CM | POA: Diagnosis not present

## 2019-08-11 DIAGNOSIS — E7800 Pure hypercholesterolemia, unspecified: Secondary | ICD-10-CM | POA: Diagnosis not present

## 2019-08-11 DIAGNOSIS — Z9181 History of falling: Secondary | ICD-10-CM | POA: Diagnosis not present

## 2019-08-11 DIAGNOSIS — F419 Anxiety disorder, unspecified: Secondary | ICD-10-CM | POA: Diagnosis not present

## 2019-08-11 DIAGNOSIS — I5032 Chronic diastolic (congestive) heart failure: Secondary | ICD-10-CM | POA: Diagnosis not present

## 2019-08-11 DIAGNOSIS — Z87891 Personal history of nicotine dependence: Secondary | ICD-10-CM | POA: Diagnosis not present

## 2019-08-12 DIAGNOSIS — Z85118 Personal history of other malignant neoplasm of bronchus and lung: Secondary | ICD-10-CM | POA: Diagnosis not present

## 2019-08-12 DIAGNOSIS — H6123 Impacted cerumen, bilateral: Secondary | ICD-10-CM | POA: Diagnosis not present

## 2019-08-12 DIAGNOSIS — E1151 Type 2 diabetes mellitus with diabetic peripheral angiopathy without gangrene: Secondary | ICD-10-CM | POA: Diagnosis not present

## 2019-08-12 DIAGNOSIS — H9193 Unspecified hearing loss, bilateral: Secondary | ICD-10-CM | POA: Diagnosis not present

## 2019-08-12 DIAGNOSIS — M5481 Occipital neuralgia: Secondary | ICD-10-CM | POA: Diagnosis not present

## 2019-08-12 DIAGNOSIS — Z7984 Long term (current) use of oral hypoglycemic drugs: Secondary | ICD-10-CM | POA: Diagnosis not present

## 2019-08-12 DIAGNOSIS — Z7902 Long term (current) use of antithrombotics/antiplatelets: Secondary | ICD-10-CM | POA: Diagnosis not present

## 2019-08-12 DIAGNOSIS — K219 Gastro-esophageal reflux disease without esophagitis: Secondary | ICD-10-CM | POA: Diagnosis not present

## 2019-08-12 DIAGNOSIS — Z87891 Personal history of nicotine dependence: Secondary | ICD-10-CM | POA: Diagnosis not present

## 2019-08-12 DIAGNOSIS — E7800 Pure hypercholesterolemia, unspecified: Secondary | ICD-10-CM | POA: Diagnosis not present

## 2019-08-12 DIAGNOSIS — I272 Pulmonary hypertension, unspecified: Secondary | ICD-10-CM | POA: Diagnosis not present

## 2019-08-12 DIAGNOSIS — Z9181 History of falling: Secondary | ICD-10-CM | POA: Diagnosis not present

## 2019-08-12 DIAGNOSIS — Z7982 Long term (current) use of aspirin: Secondary | ICD-10-CM | POA: Diagnosis not present

## 2019-08-12 DIAGNOSIS — D649 Anemia, unspecified: Secondary | ICD-10-CM | POA: Diagnosis not present

## 2019-08-12 DIAGNOSIS — Z8673 Personal history of transient ischemic attack (TIA), and cerebral infarction without residual deficits: Secondary | ICD-10-CM | POA: Diagnosis not present

## 2019-08-12 DIAGNOSIS — I5032 Chronic diastolic (congestive) heart failure: Secondary | ICD-10-CM | POA: Diagnosis not present

## 2019-08-12 DIAGNOSIS — F419 Anxiety disorder, unspecified: Secondary | ICD-10-CM | POA: Diagnosis not present

## 2019-08-12 DIAGNOSIS — I110 Hypertensive heart disease with heart failure: Secondary | ICD-10-CM | POA: Diagnosis not present

## 2019-08-12 DIAGNOSIS — F329 Major depressive disorder, single episode, unspecified: Secondary | ICD-10-CM | POA: Diagnosis not present

## 2019-08-12 DIAGNOSIS — J449 Chronic obstructive pulmonary disease, unspecified: Secondary | ICD-10-CM | POA: Diagnosis not present

## 2019-08-17 DIAGNOSIS — Z7984 Long term (current) use of oral hypoglycemic drugs: Secondary | ICD-10-CM | POA: Diagnosis not present

## 2019-08-17 DIAGNOSIS — I110 Hypertensive heart disease with heart failure: Secondary | ICD-10-CM | POA: Diagnosis not present

## 2019-08-17 DIAGNOSIS — K219 Gastro-esophageal reflux disease without esophagitis: Secondary | ICD-10-CM | POA: Diagnosis not present

## 2019-08-17 DIAGNOSIS — I272 Pulmonary hypertension, unspecified: Secondary | ICD-10-CM | POA: Diagnosis not present

## 2019-08-17 DIAGNOSIS — D649 Anemia, unspecified: Secondary | ICD-10-CM | POA: Diagnosis not present

## 2019-08-17 DIAGNOSIS — Z87891 Personal history of nicotine dependence: Secondary | ICD-10-CM | POA: Diagnosis not present

## 2019-08-17 DIAGNOSIS — F419 Anxiety disorder, unspecified: Secondary | ICD-10-CM | POA: Diagnosis not present

## 2019-08-17 DIAGNOSIS — Z9181 History of falling: Secondary | ICD-10-CM | POA: Diagnosis not present

## 2019-08-17 DIAGNOSIS — E1151 Type 2 diabetes mellitus with diabetic peripheral angiopathy without gangrene: Secondary | ICD-10-CM | POA: Diagnosis not present

## 2019-08-17 DIAGNOSIS — M5481 Occipital neuralgia: Secondary | ICD-10-CM | POA: Diagnosis not present

## 2019-08-17 DIAGNOSIS — E7800 Pure hypercholesterolemia, unspecified: Secondary | ICD-10-CM | POA: Diagnosis not present

## 2019-08-17 DIAGNOSIS — H6123 Impacted cerumen, bilateral: Secondary | ICD-10-CM | POA: Diagnosis not present

## 2019-08-17 DIAGNOSIS — J449 Chronic obstructive pulmonary disease, unspecified: Secondary | ICD-10-CM | POA: Diagnosis not present

## 2019-08-17 DIAGNOSIS — Z8673 Personal history of transient ischemic attack (TIA), and cerebral infarction without residual deficits: Secondary | ICD-10-CM | POA: Diagnosis not present

## 2019-08-17 DIAGNOSIS — H9193 Unspecified hearing loss, bilateral: Secondary | ICD-10-CM | POA: Diagnosis not present

## 2019-08-17 DIAGNOSIS — F329 Major depressive disorder, single episode, unspecified: Secondary | ICD-10-CM | POA: Diagnosis not present

## 2019-08-17 DIAGNOSIS — Z7902 Long term (current) use of antithrombotics/antiplatelets: Secondary | ICD-10-CM | POA: Diagnosis not present

## 2019-08-17 DIAGNOSIS — I5032 Chronic diastolic (congestive) heart failure: Secondary | ICD-10-CM | POA: Diagnosis not present

## 2019-08-17 DIAGNOSIS — Z7982 Long term (current) use of aspirin: Secondary | ICD-10-CM | POA: Diagnosis not present

## 2019-08-17 DIAGNOSIS — Z85118 Personal history of other malignant neoplasm of bronchus and lung: Secondary | ICD-10-CM | POA: Diagnosis not present

## 2019-08-19 DIAGNOSIS — M5481 Occipital neuralgia: Secondary | ICD-10-CM | POA: Diagnosis not present

## 2019-08-19 DIAGNOSIS — H6123 Impacted cerumen, bilateral: Secondary | ICD-10-CM | POA: Diagnosis not present

## 2019-08-19 DIAGNOSIS — E1151 Type 2 diabetes mellitus with diabetic peripheral angiopathy without gangrene: Secondary | ICD-10-CM | POA: Diagnosis not present

## 2019-08-19 DIAGNOSIS — I5032 Chronic diastolic (congestive) heart failure: Secondary | ICD-10-CM | POA: Diagnosis not present

## 2019-08-19 DIAGNOSIS — F329 Major depressive disorder, single episode, unspecified: Secondary | ICD-10-CM | POA: Diagnosis not present

## 2019-08-19 DIAGNOSIS — K219 Gastro-esophageal reflux disease without esophagitis: Secondary | ICD-10-CM | POA: Diagnosis not present

## 2019-08-19 DIAGNOSIS — H9193 Unspecified hearing loss, bilateral: Secondary | ICD-10-CM | POA: Diagnosis not present

## 2019-08-19 DIAGNOSIS — Z8673 Personal history of transient ischemic attack (TIA), and cerebral infarction without residual deficits: Secondary | ICD-10-CM | POA: Diagnosis not present

## 2019-08-19 DIAGNOSIS — J449 Chronic obstructive pulmonary disease, unspecified: Secondary | ICD-10-CM | POA: Diagnosis not present

## 2019-08-19 DIAGNOSIS — Z85118 Personal history of other malignant neoplasm of bronchus and lung: Secondary | ICD-10-CM | POA: Diagnosis not present

## 2019-08-19 DIAGNOSIS — E7800 Pure hypercholesterolemia, unspecified: Secondary | ICD-10-CM | POA: Diagnosis not present

## 2019-08-19 DIAGNOSIS — Z7982 Long term (current) use of aspirin: Secondary | ICD-10-CM | POA: Diagnosis not present

## 2019-08-19 DIAGNOSIS — I110 Hypertensive heart disease with heart failure: Secondary | ICD-10-CM | POA: Diagnosis not present

## 2019-08-19 DIAGNOSIS — F419 Anxiety disorder, unspecified: Secondary | ICD-10-CM | POA: Diagnosis not present

## 2019-08-19 DIAGNOSIS — D649 Anemia, unspecified: Secondary | ICD-10-CM | POA: Diagnosis not present

## 2019-08-19 DIAGNOSIS — Z7984 Long term (current) use of oral hypoglycemic drugs: Secondary | ICD-10-CM | POA: Diagnosis not present

## 2019-08-19 DIAGNOSIS — Z9181 History of falling: Secondary | ICD-10-CM | POA: Diagnosis not present

## 2019-08-19 DIAGNOSIS — Z87891 Personal history of nicotine dependence: Secondary | ICD-10-CM | POA: Diagnosis not present

## 2019-08-19 DIAGNOSIS — I272 Pulmonary hypertension, unspecified: Secondary | ICD-10-CM | POA: Diagnosis not present

## 2019-08-19 DIAGNOSIS — Z7902 Long term (current) use of antithrombotics/antiplatelets: Secondary | ICD-10-CM | POA: Diagnosis not present

## 2019-08-20 DIAGNOSIS — K1121 Acute sialoadenitis: Secondary | ICD-10-CM | POA: Diagnosis not present

## 2019-08-20 DIAGNOSIS — K1123 Chronic sialoadenitis: Secondary | ICD-10-CM | POA: Diagnosis not present

## 2019-08-26 ENCOUNTER — Other Ambulatory Visit: Payer: Self-pay | Admitting: Physician Assistant

## 2019-08-26 DIAGNOSIS — J449 Chronic obstructive pulmonary disease, unspecified: Secondary | ICD-10-CM | POA: Diagnosis not present

## 2019-08-26 DIAGNOSIS — K5900 Constipation, unspecified: Secondary | ICD-10-CM | POA: Diagnosis not present

## 2019-08-26 DIAGNOSIS — R109 Unspecified abdominal pain: Secondary | ICD-10-CM | POA: Diagnosis not present

## 2019-08-26 DIAGNOSIS — Z8711 Personal history of peptic ulcer disease: Secondary | ICD-10-CM | POA: Diagnosis not present

## 2019-08-26 DIAGNOSIS — R140 Abdominal distension (gaseous): Secondary | ICD-10-CM | POA: Diagnosis not present

## 2019-08-26 DIAGNOSIS — D509 Iron deficiency anemia, unspecified: Secondary | ICD-10-CM | POA: Diagnosis not present

## 2019-08-27 DIAGNOSIS — Z85118 Personal history of other malignant neoplasm of bronchus and lung: Secondary | ICD-10-CM | POA: Diagnosis not present

## 2019-08-27 DIAGNOSIS — I110 Hypertensive heart disease with heart failure: Secondary | ICD-10-CM | POA: Diagnosis not present

## 2019-08-27 DIAGNOSIS — Z87891 Personal history of nicotine dependence: Secondary | ICD-10-CM | POA: Diagnosis not present

## 2019-08-27 DIAGNOSIS — Z8673 Personal history of transient ischemic attack (TIA), and cerebral infarction without residual deficits: Secondary | ICD-10-CM | POA: Diagnosis not present

## 2019-08-27 DIAGNOSIS — Z7982 Long term (current) use of aspirin: Secondary | ICD-10-CM | POA: Diagnosis not present

## 2019-08-27 DIAGNOSIS — H9193 Unspecified hearing loss, bilateral: Secondary | ICD-10-CM | POA: Diagnosis not present

## 2019-08-27 DIAGNOSIS — Z9181 History of falling: Secondary | ICD-10-CM | POA: Diagnosis not present

## 2019-08-27 DIAGNOSIS — F419 Anxiety disorder, unspecified: Secondary | ICD-10-CM | POA: Diagnosis not present

## 2019-08-27 DIAGNOSIS — J449 Chronic obstructive pulmonary disease, unspecified: Secondary | ICD-10-CM | POA: Diagnosis not present

## 2019-08-27 DIAGNOSIS — Z7902 Long term (current) use of antithrombotics/antiplatelets: Secondary | ICD-10-CM | POA: Diagnosis not present

## 2019-08-27 DIAGNOSIS — M5481 Occipital neuralgia: Secondary | ICD-10-CM | POA: Diagnosis not present

## 2019-08-27 DIAGNOSIS — Z7984 Long term (current) use of oral hypoglycemic drugs: Secondary | ICD-10-CM | POA: Diagnosis not present

## 2019-08-27 DIAGNOSIS — D649 Anemia, unspecified: Secondary | ICD-10-CM | POA: Diagnosis not present

## 2019-08-27 DIAGNOSIS — E7800 Pure hypercholesterolemia, unspecified: Secondary | ICD-10-CM | POA: Diagnosis not present

## 2019-08-27 DIAGNOSIS — F329 Major depressive disorder, single episode, unspecified: Secondary | ICD-10-CM | POA: Diagnosis not present

## 2019-08-27 DIAGNOSIS — I5032 Chronic diastolic (congestive) heart failure: Secondary | ICD-10-CM | POA: Diagnosis not present

## 2019-08-27 DIAGNOSIS — K219 Gastro-esophageal reflux disease without esophagitis: Secondary | ICD-10-CM | POA: Diagnosis not present

## 2019-08-27 DIAGNOSIS — I272 Pulmonary hypertension, unspecified: Secondary | ICD-10-CM | POA: Diagnosis not present

## 2019-08-27 DIAGNOSIS — E1151 Type 2 diabetes mellitus with diabetic peripheral angiopathy without gangrene: Secondary | ICD-10-CM | POA: Diagnosis not present

## 2019-08-27 DIAGNOSIS — H6123 Impacted cerumen, bilateral: Secondary | ICD-10-CM | POA: Diagnosis not present

## 2019-08-28 DIAGNOSIS — F419 Anxiety disorder, unspecified: Secondary | ICD-10-CM | POA: Diagnosis not present

## 2019-08-28 DIAGNOSIS — Z7984 Long term (current) use of oral hypoglycemic drugs: Secondary | ICD-10-CM | POA: Diagnosis not present

## 2019-08-28 DIAGNOSIS — Z7982 Long term (current) use of aspirin: Secondary | ICD-10-CM | POA: Diagnosis not present

## 2019-08-28 DIAGNOSIS — E7800 Pure hypercholesterolemia, unspecified: Secondary | ICD-10-CM | POA: Diagnosis not present

## 2019-08-28 DIAGNOSIS — K219 Gastro-esophageal reflux disease without esophagitis: Secondary | ICD-10-CM | POA: Diagnosis not present

## 2019-08-28 DIAGNOSIS — D649 Anemia, unspecified: Secondary | ICD-10-CM | POA: Diagnosis not present

## 2019-08-28 DIAGNOSIS — I272 Pulmonary hypertension, unspecified: Secondary | ICD-10-CM | POA: Diagnosis not present

## 2019-08-28 DIAGNOSIS — I5032 Chronic diastolic (congestive) heart failure: Secondary | ICD-10-CM | POA: Diagnosis not present

## 2019-08-28 DIAGNOSIS — H6123 Impacted cerumen, bilateral: Secondary | ICD-10-CM | POA: Diagnosis not present

## 2019-08-28 DIAGNOSIS — J449 Chronic obstructive pulmonary disease, unspecified: Secondary | ICD-10-CM | POA: Diagnosis not present

## 2019-08-28 DIAGNOSIS — H9193 Unspecified hearing loss, bilateral: Secondary | ICD-10-CM | POA: Diagnosis not present

## 2019-08-28 DIAGNOSIS — Z87891 Personal history of nicotine dependence: Secondary | ICD-10-CM | POA: Diagnosis not present

## 2019-08-28 DIAGNOSIS — Z8673 Personal history of transient ischemic attack (TIA), and cerebral infarction without residual deficits: Secondary | ICD-10-CM | POA: Diagnosis not present

## 2019-08-28 DIAGNOSIS — E1151 Type 2 diabetes mellitus with diabetic peripheral angiopathy without gangrene: Secondary | ICD-10-CM | POA: Diagnosis not present

## 2019-08-28 DIAGNOSIS — Z7902 Long term (current) use of antithrombotics/antiplatelets: Secondary | ICD-10-CM | POA: Diagnosis not present

## 2019-08-28 DIAGNOSIS — I69398 Other sequelae of cerebral infarction: Secondary | ICD-10-CM | POA: Diagnosis not present

## 2019-08-28 DIAGNOSIS — Z85118 Personal history of other malignant neoplasm of bronchus and lung: Secondary | ICD-10-CM | POA: Diagnosis not present

## 2019-08-28 DIAGNOSIS — I110 Hypertensive heart disease with heart failure: Secondary | ICD-10-CM | POA: Diagnosis not present

## 2019-08-28 DIAGNOSIS — F329 Major depressive disorder, single episode, unspecified: Secondary | ICD-10-CM | POA: Diagnosis not present

## 2019-08-28 DIAGNOSIS — Z9181 History of falling: Secondary | ICD-10-CM | POA: Diagnosis not present

## 2019-08-28 DIAGNOSIS — M5481 Occipital neuralgia: Secondary | ICD-10-CM | POA: Diagnosis not present

## 2019-08-31 ENCOUNTER — Other Ambulatory Visit: Payer: Self-pay | Admitting: Otolaryngology

## 2019-08-31 DIAGNOSIS — K1121 Acute sialoadenitis: Secondary | ICD-10-CM

## 2019-08-31 DIAGNOSIS — K1123 Chronic sialoadenitis: Secondary | ICD-10-CM

## 2019-09-02 DIAGNOSIS — M5481 Occipital neuralgia: Secondary | ICD-10-CM | POA: Diagnosis not present

## 2019-09-02 DIAGNOSIS — I110 Hypertensive heart disease with heart failure: Secondary | ICD-10-CM | POA: Diagnosis not present

## 2019-09-02 DIAGNOSIS — Z8673 Personal history of transient ischemic attack (TIA), and cerebral infarction without residual deficits: Secondary | ICD-10-CM | POA: Diagnosis not present

## 2019-09-02 DIAGNOSIS — I5032 Chronic diastolic (congestive) heart failure: Secondary | ICD-10-CM | POA: Diagnosis not present

## 2019-09-02 DIAGNOSIS — K219 Gastro-esophageal reflux disease without esophagitis: Secondary | ICD-10-CM | POA: Diagnosis not present

## 2019-09-02 DIAGNOSIS — J449 Chronic obstructive pulmonary disease, unspecified: Secondary | ICD-10-CM | POA: Diagnosis not present

## 2019-09-02 DIAGNOSIS — Z7982 Long term (current) use of aspirin: Secondary | ICD-10-CM | POA: Diagnosis not present

## 2019-09-02 DIAGNOSIS — F329 Major depressive disorder, single episode, unspecified: Secondary | ICD-10-CM | POA: Diagnosis not present

## 2019-09-02 DIAGNOSIS — Z7984 Long term (current) use of oral hypoglycemic drugs: Secondary | ICD-10-CM | POA: Diagnosis not present

## 2019-09-02 DIAGNOSIS — I272 Pulmonary hypertension, unspecified: Secondary | ICD-10-CM | POA: Diagnosis not present

## 2019-09-02 DIAGNOSIS — H9193 Unspecified hearing loss, bilateral: Secondary | ICD-10-CM | POA: Diagnosis not present

## 2019-09-02 DIAGNOSIS — Z85118 Personal history of other malignant neoplasm of bronchus and lung: Secondary | ICD-10-CM | POA: Diagnosis not present

## 2019-09-02 DIAGNOSIS — Z87891 Personal history of nicotine dependence: Secondary | ICD-10-CM | POA: Diagnosis not present

## 2019-09-02 DIAGNOSIS — Z7902 Long term (current) use of antithrombotics/antiplatelets: Secondary | ICD-10-CM | POA: Diagnosis not present

## 2019-09-02 DIAGNOSIS — H6123 Impacted cerumen, bilateral: Secondary | ICD-10-CM | POA: Diagnosis not present

## 2019-09-02 DIAGNOSIS — Z9181 History of falling: Secondary | ICD-10-CM | POA: Diagnosis not present

## 2019-09-02 DIAGNOSIS — E1151 Type 2 diabetes mellitus with diabetic peripheral angiopathy without gangrene: Secondary | ICD-10-CM | POA: Diagnosis not present

## 2019-09-02 DIAGNOSIS — D649 Anemia, unspecified: Secondary | ICD-10-CM | POA: Diagnosis not present

## 2019-09-02 DIAGNOSIS — F419 Anxiety disorder, unspecified: Secondary | ICD-10-CM | POA: Diagnosis not present

## 2019-09-02 DIAGNOSIS — E7800 Pure hypercholesterolemia, unspecified: Secondary | ICD-10-CM | POA: Diagnosis not present

## 2019-09-04 DIAGNOSIS — M47812 Spondylosis without myelopathy or radiculopathy, cervical region: Secondary | ICD-10-CM | POA: Diagnosis not present

## 2019-09-08 DIAGNOSIS — I739 Peripheral vascular disease, unspecified: Secondary | ICD-10-CM | POA: Diagnosis not present

## 2019-09-08 DIAGNOSIS — D649 Anemia, unspecified: Secondary | ICD-10-CM | POA: Diagnosis not present

## 2019-09-08 DIAGNOSIS — I5032 Chronic diastolic (congestive) heart failure: Secondary | ICD-10-CM | POA: Diagnosis not present

## 2019-09-08 DIAGNOSIS — F419 Anxiety disorder, unspecified: Secondary | ICD-10-CM | POA: Diagnosis not present

## 2019-09-08 DIAGNOSIS — G4700 Insomnia, unspecified: Secondary | ICD-10-CM | POA: Diagnosis not present

## 2019-09-08 DIAGNOSIS — E785 Hyperlipidemia, unspecified: Secondary | ICD-10-CM | POA: Diagnosis not present

## 2019-09-08 DIAGNOSIS — G629 Polyneuropathy, unspecified: Secondary | ICD-10-CM | POA: Diagnosis not present

## 2019-09-08 DIAGNOSIS — I110 Hypertensive heart disease with heart failure: Secondary | ICD-10-CM | POA: Diagnosis not present

## 2019-09-08 DIAGNOSIS — Z0000 Encounter for general adult medical examination without abnormal findings: Secondary | ICD-10-CM | POA: Diagnosis not present

## 2019-09-08 DIAGNOSIS — E119 Type 2 diabetes mellitus without complications: Secondary | ICD-10-CM | POA: Diagnosis not present

## 2019-09-08 DIAGNOSIS — K219 Gastro-esophageal reflux disease without esophagitis: Secondary | ICD-10-CM | POA: Diagnosis not present

## 2019-09-08 DIAGNOSIS — R1084 Generalized abdominal pain: Secondary | ICD-10-CM | POA: Diagnosis not present

## 2019-09-08 DIAGNOSIS — Z87891 Personal history of nicotine dependence: Secondary | ICD-10-CM | POA: Diagnosis not present

## 2019-09-10 ENCOUNTER — Other Ambulatory Visit: Payer: PPO

## 2019-09-17 ENCOUNTER — Inpatient Hospital Stay: Admission: RE | Admit: 2019-09-17 | Payer: PPO | Source: Ambulatory Visit

## 2019-09-17 ENCOUNTER — Other Ambulatory Visit: Payer: PPO

## 2019-09-28 ENCOUNTER — Encounter (HOSPITAL_COMMUNITY): Payer: Self-pay

## 2019-09-28 DIAGNOSIS — J449 Chronic obstructive pulmonary disease, unspecified: Secondary | ICD-10-CM | POA: Diagnosis not present

## 2019-09-28 DIAGNOSIS — I69398 Other sequelae of cerebral infarction: Secondary | ICD-10-CM | POA: Diagnosis not present

## 2019-09-28 DIAGNOSIS — I272 Pulmonary hypertension, unspecified: Secondary | ICD-10-CM | POA: Diagnosis not present

## 2019-09-28 DIAGNOSIS — Z85118 Personal history of other malignant neoplasm of bronchus and lung: Secondary | ICD-10-CM | POA: Diagnosis not present

## 2019-10-02 DIAGNOSIS — M5481 Occipital neuralgia: Secondary | ICD-10-CM | POA: Diagnosis not present

## 2019-10-02 DIAGNOSIS — M47812 Spondylosis without myelopathy or radiculopathy, cervical region: Secondary | ICD-10-CM | POA: Diagnosis not present

## 2019-10-07 ENCOUNTER — Ambulatory Visit (HOSPITAL_BASED_OUTPATIENT_CLINIC_OR_DEPARTMENT_OTHER)
Admission: RE | Admit: 2019-10-07 | Discharge: 2019-10-07 | Disposition: A | Discharge status: Home / Self Care | Payer: PPO | Source: Ambulatory Visit | Attending: Internal Medicine | Admitting: Internal Medicine

## 2019-10-07 ENCOUNTER — Encounter (HOSPITAL_COMMUNITY): Payer: Self-pay | Admitting: Internal Medicine

## 2019-10-07 ENCOUNTER — Ambulatory Visit (HOSPITAL_COMMUNITY)
Admission: RE | Admit: 2019-10-07 | Discharge: 2019-10-07 | Disposition: A | Discharge status: Home / Self Care | Payer: PPO | Source: Ambulatory Visit | Attending: Internal Medicine | Admitting: Internal Medicine

## 2019-10-07 ENCOUNTER — Other Ambulatory Visit: Payer: Self-pay

## 2019-10-07 VITALS — BP 120/62 | HR 61 | Wt 177.0 lb

## 2019-10-07 DIAGNOSIS — K219 Gastro-esophageal reflux disease without esophagitis: Secondary | ICD-10-CM | POA: Diagnosis not present

## 2019-10-07 DIAGNOSIS — I5032 Chronic diastolic (congestive) heart failure: Secondary | ICD-10-CM | POA: Insufficient documentation

## 2019-10-07 DIAGNOSIS — J449 Chronic obstructive pulmonary disease, unspecified: Secondary | ICD-10-CM | POA: Insufficient documentation

## 2019-10-07 DIAGNOSIS — Z87442 Personal history of urinary calculi: Secondary | ICD-10-CM | POA: Insufficient documentation

## 2019-10-07 DIAGNOSIS — J9611 Chronic respiratory failure with hypoxia: Secondary | ICD-10-CM | POA: Diagnosis not present

## 2019-10-07 DIAGNOSIS — F419 Anxiety disorder, unspecified: Secondary | ICD-10-CM | POA: Insufficient documentation

## 2019-10-07 DIAGNOSIS — Z7984 Long term (current) use of oral hypoglycemic drugs: Secondary | ICD-10-CM | POA: Insufficient documentation

## 2019-10-07 DIAGNOSIS — Z841 Family history of disorders of kidney and ureter: Secondary | ICD-10-CM | POA: Insufficient documentation

## 2019-10-07 DIAGNOSIS — M7989 Other specified soft tissue disorders: Secondary | ICD-10-CM

## 2019-10-07 DIAGNOSIS — I5042 Chronic combined systolic (congestive) and diastolic (congestive) heart failure: Secondary | ICD-10-CM | POA: Diagnosis not present

## 2019-10-07 DIAGNOSIS — I739 Peripheral vascular disease, unspecified: Secondary | ICD-10-CM

## 2019-10-07 DIAGNOSIS — Z7951 Long term (current) use of inhaled steroids: Secondary | ICD-10-CM | POA: Diagnosis not present

## 2019-10-07 DIAGNOSIS — Z8249 Family history of ischemic heart disease and other diseases of the circulatory system: Secondary | ICD-10-CM | POA: Insufficient documentation

## 2019-10-07 DIAGNOSIS — I2510 Atherosclerotic heart disease of native coronary artery without angina pectoris: Secondary | ICD-10-CM | POA: Insufficient documentation

## 2019-10-07 DIAGNOSIS — Z902 Acquired absence of lung [part of]: Secondary | ICD-10-CM | POA: Diagnosis not present

## 2019-10-07 DIAGNOSIS — Z8673 Personal history of transient ischemic attack (TIA), and cerebral infarction without residual deficits: Secondary | ICD-10-CM | POA: Insufficient documentation

## 2019-10-07 DIAGNOSIS — Z79899 Other long term (current) drug therapy: Secondary | ICD-10-CM | POA: Diagnosis not present

## 2019-10-07 DIAGNOSIS — Z7902 Long term (current) use of antithrombotics/antiplatelets: Secondary | ICD-10-CM | POA: Insufficient documentation

## 2019-10-07 DIAGNOSIS — Z823 Family history of stroke: Secondary | ICD-10-CM | POA: Diagnosis not present

## 2019-10-07 DIAGNOSIS — M1990 Unspecified osteoarthritis, unspecified site: Secondary | ICD-10-CM | POA: Insufficient documentation

## 2019-10-07 DIAGNOSIS — R42 Dizziness and giddiness: Secondary | ICD-10-CM | POA: Insufficient documentation

## 2019-10-07 DIAGNOSIS — Z85118 Personal history of other malignant neoplasm of bronchus and lung: Secondary | ICD-10-CM | POA: Insufficient documentation

## 2019-10-07 DIAGNOSIS — E1151 Type 2 diabetes mellitus with diabetic peripheral angiopathy without gangrene: Secondary | ICD-10-CM | POA: Insufficient documentation

## 2019-10-07 DIAGNOSIS — Z87891 Personal history of nicotine dependence: Secondary | ICD-10-CM | POA: Diagnosis not present

## 2019-10-07 DIAGNOSIS — I110 Hypertensive heart disease with heart failure: Secondary | ICD-10-CM | POA: Insufficient documentation

## 2019-10-07 DIAGNOSIS — I272 Pulmonary hypertension, unspecified: Secondary | ICD-10-CM | POA: Diagnosis not present

## 2019-10-07 DIAGNOSIS — Z9981 Dependence on supplemental oxygen: Secondary | ICD-10-CM | POA: Insufficient documentation

## 2019-10-07 DIAGNOSIS — E7800 Pure hypercholesterolemia, unspecified: Secondary | ICD-10-CM | POA: Insufficient documentation

## 2019-10-07 DIAGNOSIS — I5033 Acute on chronic diastolic (congestive) heart failure: Secondary | ICD-10-CM | POA: Diagnosis not present

## 2019-10-07 DIAGNOSIS — Z7982 Long term (current) use of aspirin: Secondary | ICD-10-CM | POA: Insufficient documentation

## 2019-10-07 DIAGNOSIS — R600 Localized edema: Secondary | ICD-10-CM | POA: Insufficient documentation

## 2019-10-07 LAB — BASIC METABOLIC PANEL
Anion gap: 10 (ref 5–15)
BUN: 18 mg/dL (ref 8–23)
CO2: 33 mmol/L — ABNORMAL HIGH (ref 22–32)
Calcium: 9.1 mg/dL (ref 8.9–10.3)
Chloride: 96 mmol/L — ABNORMAL LOW (ref 98–111)
Creatinine, Ser: 0.87 mg/dL (ref 0.44–1.00)
GFR calc Af Amer: >60 mL/min (ref >60)
GFR calc non Af Amer: >60 mL/min (ref >60)
Glucose, Bld: 193 mg/dL — ABNORMAL HIGH (ref 70–99)
Potassium: 3.7 mmol/L (ref 3.5–5.1)
Sodium: 139 mmol/L (ref 135–145)

## 2019-10-07 LAB — CBC
HCT: 45.3 % (ref 36.0–46.0)
Hemoglobin: 14 g/dL (ref 12.0–15.0)
MCH: 27.9 pg (ref 26.0–34.0)
MCHC: 30.9 g/dL (ref 30.0–36.0)
MCV: 90.4 fL (ref 80.0–100.0)
Platelets: 260 10*3/uL (ref 150–400)
RBC: 5.01 MIL/uL (ref 3.87–5.11)
RDW: 16.9 % — ABNORMAL HIGH (ref 11.5–15.5)
WBC: 8.1 10*3/uL (ref 4.0–10.5)
nRBC: 0 % (ref 0.0–0.2)

## 2019-10-07 LAB — BRAIN NATRIURETIC PEPTIDE: B Natriuretic Peptide: 34.9 pg/mL (ref 0.0–100.0)

## 2019-10-07 MED ORDER — METOLAZONE 5 MG PO TABS: ORAL_TABLET | ORAL | 0 refills | Status: DC

## 2019-10-07 NOTE — Progress Notes (Signed)
ADVANCED HF CLINIC NOTE  Referring Physician: Dr Gwenlyn Found  Primary Care: Primary Cardiologist: Dr Gwenlyn Found Pulmonary: Dr Melvyn Novas   HPI  Caitlin Oliver is a 79 year old with history of COPD, DM, PAD, LCEA 2015, R CEA 2014, GERD, hyperlipidemia, anxiety, HTN,chronic respiratory failure on 2 liters at home, lung cancer, S/P L upper lobectomy 2017, and chronic diastolic heart failure.   Admitted 06/16/18 with difficulty walking with ataxia. Code Stroke called. R precentral gyrus 5 mm ischemic stroke noted. Discharged on oxygen. Discharged on asa and plavix. She was later discharged to SNF for an extended stay.   Admitted to [facility] on 11/13/18  with increased shortness of breath and chest pain. Diuresed with IV lasix and transitioned to lasix 40 mg twice a day. Had RHC/LHC with mod pulmonary htn and elevated filling pressures.  S/p repair 06/19/19 with right carotid exposure with common carotid and innominate artery stenting as well as subclavian artery stenting via brachial approach by Dr. Carlis Abbott.  Here for routine f/u. Says she is struggling lately. Earlier in the week both legs were swollen and weight up 7 pounds.  Increased torsemide from 40 bid to 60 bid and only lost 2 pounds. RLE went down but LLE still swollen and sore. Still very SOB. Frequent cough. Stomach and back hurt   PFTs 11/20 FEV1 1.25 (62%) FVC 2.03 (75%) DLCO 51%  PFTs 8/17 FEV1 1.77 (84%) FVC 2.49 (88%) DLCO 69%  RHC/LHC  11/20/2018  RA 11 PAP 79/39 (52)  PCWP 31 PVR 4.9  CO 4.3  CI 2.9  1. Mild to moderate, non-obstructive coronary artery disease. 2. Severe pulmonary hypertension. 3. Moderately to severely elevated left and right heart filling pressures. 4. Mildly reduced Fick cardiac output/index. 5. Severe right brachiocephalic and subclavian artery stenoses with combined gradient of ~50 mmHg and possible reversal of flow in the right vertebral artery (subclavian steal).  ECHO 11/14/18 EF 65%  Moderate Asymmetric  hypertrophy septal wall. RV mild reduced. Small pericardial effusion.    Past Medical History:  Diagnosis Date  . Anxiety   . Arthritis   . Cancer ([place])    lung cancer  . Carotid artery disease ([place])   . Cataracts, bilateral   . CHF (congestive heart failure) ([place])   . Claudication ([place])   . COPD (chronic obstructive pulmonary disease) ([place])   . Diabetes mellitus   . Dizzy   . Family history of adverse reaction to anesthesia    Pt nephew has PONV  . GERD (gastroesophageal reflux disease)   . Headache   . History of kidney stones   . Hypercholesteremia   . Hypertension   . 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 Pneumonia   . Stomach ulcer   . Stroke ([place])   . Tobacco abuse   . Wears glasses     Current Outpatient Medications  Medication Sig Dispense Refill  . albuterol (PROVENTIL HFA;VENTOLIN HFA) 108 (90 Base) MCG/ACT inhaler Inhale 2 puffs into the lungs every 6 (six) hours as needed for wheezing or shortness of breath.     [name] albuterol (PROVENTIL) (2.5 MG/3ML) 0.083% nebulizer solution Take 2.5 mg by nebulization in the morning and at bedtime.    . ALPRAZolam (XANAX) 0.5 MG tablet Take 1-2 tablets 45 minutes before MRI, may take a 3rd if needed 3 tablet 0  . aspirin 81 MG chewable tablet Chew 81 mg by mouth at bedtime.     [name]  budesonide (PULMICORT) 0.25 MG/2ML nebulizer solution Take 2 mLs (0.25 mg total) by nebulization 2 (two) times daily. 60 mL 12  . calcium carbonate (CALCIUM 600) 600 MG TABS tablet Take 600 mg by mouth daily before supper.     . clopidogrel (PLAVIX) 75 MG tablet Take 1 tablet (75 mg total) by mouth daily.    . Cyanocobalamin (VITAMIN B-12) 2500 MCG SUBL Take 2,500 mcg by mouth daily.    . diazepam (VALIUM) 5 MG tablet Take one pill per day as needed for anxiety - use sparingly    . FLUoxetine (PROZAC) 10 MG capsule Take one pill per day    . gabapentin (NEURONTIN) 100 MG capsule Take 1 capsule (100 mg  total) by mouth 3 (three) times daily. (Patient taking differently: Take 200 mg by mouth in the morning and at bedtime. ) 90 capsule 0  . HYDROcodone-acetaminophen (NORCO/VICODIN) 5-325 MG tablet Take 1 tablet by mouth every 6 (six) hours as needed for moderate pain. 15 tablet 0  . Magnesium 250 MG TABS Take 250 mg by mouth daily.    . Menthol, Topical Analgesic, (ICY HOT EX) Apply 1 spray topically every 8 (eight) hours as needed (pain).    . metFORMIN (GLUCOPHAGE) 500 MG tablet Take by mouth 2 (two) times daily with a meal.    . metoprolol tartrate (LOPRESSOR) 50 MG tablet Take 50 mg by mouth 2 (two) times daily.    . Omega-3 Fatty Acids (FISH OIL) 1200 MG CAPS Take 1,200 mg by mouth daily.    . ondansetron (ZOFRAN) 4 MG tablet Take 4 mg by mouth at bedtime as needed for nausea or vomiting.     . ONE TOUCH ULTRA TEST test strip     . pantoprazole (PROTONIX) 40 MG tablet Take 40 mg by mouth 2 (two) times daily.     . polyethylene glycol (MIRALAX / GLYCOLAX) 17 g packet Take 17 g by mouth at bedtime.     . potassium chloride SA (KLOR-CON) 20 MEQ tablet Take 1 tablet (20 mEq total) by mouth daily. (Patient taking differently: Take 20 mEq by mouth daily with supper. ) 90 tablet 3  . pravastatin (PRAVACHOL) 40 MG tablet Take 20 mg by mouth daily before supper.     . topiramate (TOPAMAX) 50 MG tablet Take 50 mg by mouth 2 (two) times daily.    [name] torsemide (DEMADEX) 20 MG tablet Take 2 tablets (40 mg total) by mouth 2 (two) times daily. 360 tablet 3  . traZODone (DESYREL) 50 MG tablet Take 50 mg by mouth at bedtime.    . meclizine (ANTIVERT) 12.5 MG tablet Take 12.5 mg by mouth 3 (three) times daily as needed for dizziness or nausea.  (Patient not taking: Reported on 10/07/2019)     No current facility-administered medications for this encounter.    No Known Allergies    Social History   Socioeconomic History  . Marital status: Widowed    Spouse name: Not on file  . Number of children: Not on  file  . Years of education: Not on file  . Highest education level: Not on file  Occupational History  . Not on file  Tobacco Use  . Smoking status: Former Smoker    Packs/day: 1.50    Years: 56.00    Pack years: 84.00    Types: Cigarettes    Quit date: 07/07/2013    Years since quitting: 6.2  . Smokeless tobacco: Former Systems developer    Quit date: 02/25/2013  Vaping Use  . Vaping Use: Some days  Substance and Sexual Activity  . Alcohol use: No  . Drug use: No  . Sexual activity: Never  Other Topics Concern  . Not on file  Social History Narrative  . Not on file   Social Determinants of Health   Financial Resource Strain:   . Difficulty of Paying Living Expenses:   Food Insecurity:   . Worried About Charity fundraiser in the Last Year:   . Arboriculturist in the Last Year:   Transportation Needs:   . Film/video editor (Medical):   [name] Lack of Transportation (Non-Medical):   Physical Activity:   . Days of Exercise per Week:   . Minutes of Exercise per Session:   Stress:   . Feeling of Stress :   Social Connections:   . Frequency of Communication with Friends and Family:   . Frequency of Social Gatherings with Friends and Family:   . Attends Religious Services:   . Active Member of Clubs or Organizations:   . Attends Archivist Meetings:   [name] Marital Status:   Intimate Partner Violence:   . Fear of Current or Ex-Partner:   . Emotionally Abused:   [name] Physically Abused:   . Sexually Abused:       Family History  Problem Relation Age of Onset  . Stroke Mother   . Hypertension Mother   . Kidney failure Father     Vitals:   10/07/19 1159  BP: 120/62  Pulse: 61  SpO2: 95%  Weight: 80.3 kg (177 lb)   Wt Readings from Last 3 Encounters:  10/07/19 80.3 kg (177 lb)  07/14/19 77.6 kg (171 lb)  06/19/19 78.5 kg (173 lb)    PHYSICAL EXAM: General:  Chronically ill appearing in WC on O2. SOB at rest  HEENT: normal Neck: supple. no JVD. Carotids 2+  bilat; no bruits. No lymphadenopathy or thryomegaly appreciated. Cor: PMI nondisplaced. Regular rate & rhythm. No rubs, gallops or murmurs. Lungs: clear with markedly reduced BS throughout Abdomen: obese soft, diffuse mild tenderness, nondistended. No hepatosplenomegaly. No bruits or masses. Good bowel sounds. Extremities: no cyanosis, clubbing, rash, no edema on R 2-3+ edema on left mildly cyanotic  Neuro: alert & orientedx3, cranial nerves grossly intact. moves all 4 extremities w/o difficulty. Affect pleasant  ASSESSMENT & PLAN:  1. Pulmonary HTN, Mixed presentation.  - she has mixed PAH/PVH. RHC (8/20)  RA 11,PAP 79/39 (52) ,PCWP 31, PVR 4.8, CO 4.3 , CI 2.9 - suspect combination of WHO Group II and III  - PFTs with obstructive/restrictive picture - Sleep study ordered but not done yet - Refuses Pulmonary Rehab due to distance to travel - Not candidate for selective pulmonary artery vasodilators - Remains NYHA IV - Now with recurrent volume overload. Will give metolazone 22m x 1 with K 40  - Labs today  2. Diastolic HF - ECHO 76/06/30EF 65% small pericardial effusion.  - NYHA IV - Now with recurrent volume overload - Plan as above  3. LLE edema - asymmetric. Concern for DVT. LLE u/s today  3. Chronic Hypoxic Respiratory Failure due to COPD and H/O Lung Cancer S/P LUL lobectomy  - On home oxygen.  - F/u with Dr. WMelvyn Novas 4. Suspected Sleep Apnea  -  Will need to f/u on getting home sleep study  - She is not that interested in this   5. Chronic dizziness - due to previous CVA +/-  posterior circulation insufficiency  6. Severe PAD  - CT angiogram revealed near total occlusion of the innominate artery at the origin with extensive calcification of the arch. There is then is an area above the origin with mild narrowing of the innominate right at its bifurcation. There is a high-grade stenosis of the right common carotid and apparently mild stenosis of the right subclavian  takeoff - s/p repair 06/19/19 with right carotid exposure with common carotid and innominate artery stenting as well as subclavian artery stenting via brachial approach by Dr. Carlis Abbott.    Caitlin Bickers MD 12:06 PM

## 2019-10-07 NOTE — Progress Notes (Signed)
Lower extremity venous has been completed.   Preliminary results in CV Proc.   Abram Sander 10/07/2019 1:25 PM

## 2019-10-07 NOTE — Patient Instructions (Addendum)
Labs done today. We will contact you only if your labs are abnormal.  START Metolazone 39m take only as directed by the CHF Clinic. TODAY TAKE 1 TABLET OF METOLAZONE 5MG TODAY ONLY. ALSO TAKE EXTRA POTASSIUM 40MEQ(2 TABLETS) TODAY AS WELL.  No other medication changes were made. Please continue all other medications as prescribed.  Your physician has requested that you have a lower or upper extremity venous duplex. This test is an ultrasound of the veins in the legs or arms. It looks at venous blood flow that carries blood from the heart to the legs or arms. Allow one hour for a Lower Venous exam. Allow thirty minutes for an Upper Venous exam. There are no restrictions or special instructions. This will be done immediately after your appointment and we will contact you with the results.   Your physician recommends that you schedule a follow-up appointment in: 4 months. Our office will contact you at a later date to schedule an appointment.   If you have any questions or concerns before your next appointment please send [place] message through [email or web address] call our office at [phone]    TO LEAVE A MESSAGE FOR THE NURSE SELECT OPTION 2, PLEASE LEAVE A MESSAGE INCLUDING:  YOUR NAME  DATE OF BIRTH  CALL BACK NUMBER  REASON FOR CALL**this is important as we prioritize the call backs  YOU WILL RECEIVE A CALL BACK THE SAME DAY AS LONG AS YOU CALL BEFORE 4:00 PM   At the [facility] you and your health needs are our priority. As part of our continuing mission to provide you with exceptional heart care, we have created designated Provider Care Teams. These Care Teams include your primary Cardiologist (physician) and Advanced Practice Providers (APPs- Physician Assistants and Nurse Practitioners) who all work together to provide you with the care you need, when you need it.   You may see any of the following providers on your designated Care Team at your next follow up:  Dr  DGlori Bickers Dr DHaynes Kerns NP  BLyda Jester PUtah LAudry Riles PharmD   Please be sure to bring in all your medications bottles to every appointment.

## 2019-10-28 DIAGNOSIS — I69398 Other sequelae of cerebral infarction: Secondary | ICD-10-CM | POA: Diagnosis not present

## 2019-10-28 DIAGNOSIS — I10 Essential (primary) hypertension: Secondary | ICD-10-CM | POA: Diagnosis not present

## 2019-10-28 DIAGNOSIS — I272 Pulmonary hypertension, unspecified: Secondary | ICD-10-CM | POA: Diagnosis not present

## 2019-10-28 DIAGNOSIS — D649 Anemia, unspecified: Secondary | ICD-10-CM | POA: Diagnosis not present

## 2019-10-28 DIAGNOSIS — J449 Chronic obstructive pulmonary disease, unspecified: Secondary | ICD-10-CM | POA: Diagnosis not present

## 2019-10-28 DIAGNOSIS — Z85118 Personal history of other malignant neoplasm of bronchus and lung: Secondary | ICD-10-CM | POA: Diagnosis not present

## 2019-10-29 ENCOUNTER — Ambulatory Visit
Admission: RE | Admit: 2019-10-29 | Discharge: 2019-10-29 | Disposition: A | Discharge status: Home / Self Care | Payer: PPO | Source: Ambulatory Visit | Attending: Gastroenterology | Admitting: Gastroenterology

## 2019-10-29 ENCOUNTER — Ambulatory Visit
Admission: RE | Admit: 2019-10-29 | Discharge: 2019-10-29 | Disposition: A | Discharge status: Home / Self Care | Payer: PPO | Source: Ambulatory Visit | Attending: Otolaryngology | Admitting: Otolaryngology

## 2019-10-29 ENCOUNTER — Other Ambulatory Visit: Payer: Self-pay | Admitting: Gastroenterology

## 2019-10-29 ENCOUNTER — Other Ambulatory Visit: Payer: Self-pay

## 2019-10-29 DIAGNOSIS — R109 Unspecified abdominal pain: Secondary | ICD-10-CM

## 2019-10-29 DIAGNOSIS — R112 Nausea with vomiting, unspecified: Secondary | ICD-10-CM | POA: Diagnosis not present

## 2019-10-29 DIAGNOSIS — N2889 Other specified disorders of kidney and ureter: Secondary | ICD-10-CM | POA: Diagnosis not present

## 2019-10-29 DIAGNOSIS — K115 Sialolithiasis: Secondary | ICD-10-CM | POA: Diagnosis not present

## 2019-10-29 DIAGNOSIS — I739 Peripheral vascular disease, unspecified: Secondary | ICD-10-CM | POA: Diagnosis not present

## 2019-10-29 DIAGNOSIS — I700 Atherosclerosis of aorta: Secondary | ICD-10-CM | POA: Diagnosis not present

## 2019-10-29 DIAGNOSIS — K1123 Chronic sialoadenitis: Secondary | ICD-10-CM

## 2019-10-29 DIAGNOSIS — R220 Localized swelling, mass and lump, head: Secondary | ICD-10-CM | POA: Diagnosis not present

## 2019-10-29 DIAGNOSIS — K1121 Acute sialoadenitis: Secondary | ICD-10-CM

## 2019-10-29 DIAGNOSIS — M7989 Other specified soft tissue disorders: Secondary | ICD-10-CM | POA: Diagnosis not present

## 2019-10-29 DIAGNOSIS — R1084 Generalized abdominal pain: Secondary | ICD-10-CM | POA: Diagnosis not present

## 2019-10-29 MED ORDER — IOPAMIDOL (ISOVUE-300) INJECTION 61%
100.0000 mL | Freq: Once | INTRAVENOUS | Status: AC | PRN
  Administered 2019-10-29: 100 mL via INTRAVENOUS

## 2019-10-29 MED ORDER — IOPAMIDOL (ISOVUE-300) INJECTION 61%
50.0000 mL | Freq: Once | INTRAVENOUS | Status: AC | PRN
  Administered 2019-10-29: 50 mL via INTRAVENOUS

## 2019-11-04 NOTE — Telephone Encounter (Signed)
Caitlin Oliver is handling this .

## 2019-11-09 ENCOUNTER — Other Ambulatory Visit: Payer: PPO

## 2019-11-17 DIAGNOSIS — T162XXA Foreign body in left ear, initial encounter: Secondary | ICD-10-CM | POA: Diagnosis not present

## 2019-11-17 DIAGNOSIS — M26622 Arthralgia of left temporomandibular joint: Secondary | ICD-10-CM | POA: Diagnosis not present

## 2019-11-17 DIAGNOSIS — K115 Sialolithiasis: Secondary | ICD-10-CM | POA: Diagnosis not present

## 2019-11-17 DIAGNOSIS — K118 Other diseases of salivary glands: Secondary | ICD-10-CM | POA: Diagnosis not present

## 2019-11-17 DIAGNOSIS — Z87891 Personal history of nicotine dependence: Secondary | ICD-10-CM | POA: Diagnosis not present

## 2019-11-17 DIAGNOSIS — K1123 Chronic sialoadenitis: Secondary | ICD-10-CM | POA: Diagnosis not present

## 2019-11-17 DIAGNOSIS — Z7902 Long term (current) use of antithrombotics/antiplatelets: Secondary | ICD-10-CM | POA: Diagnosis not present

## 2019-11-17 DIAGNOSIS — Z7982 Long term (current) use of aspirin: Secondary | ICD-10-CM | POA: Diagnosis not present

## 2019-11-28 DIAGNOSIS — Z85118 Personal history of other malignant neoplasm of bronchus and lung: Secondary | ICD-10-CM | POA: Diagnosis not present

## 2019-11-28 DIAGNOSIS — I272 Pulmonary hypertension, unspecified: Secondary | ICD-10-CM | POA: Diagnosis not present

## 2019-11-28 DIAGNOSIS — I69398 Other sequelae of cerebral infarction: Secondary | ICD-10-CM | POA: Diagnosis not present

## 2019-11-28 DIAGNOSIS — J449 Chronic obstructive pulmonary disease, unspecified: Secondary | ICD-10-CM | POA: Diagnosis not present

## 2019-12-02 DIAGNOSIS — Z87891 Personal history of nicotine dependence: Secondary | ICD-10-CM | POA: Diagnosis not present

## 2019-12-02 DIAGNOSIS — K1123 Chronic sialoadenitis: Secondary | ICD-10-CM | POA: Diagnosis not present

## 2019-12-02 DIAGNOSIS — E119 Type 2 diabetes mellitus without complications: Secondary | ICD-10-CM | POA: Diagnosis not present

## 2019-12-02 DIAGNOSIS — Z8673 Personal history of transient ischemic attack (TIA), and cerebral infarction without residual deficits: Secondary | ICD-10-CM | POA: Diagnosis not present

## 2019-12-02 DIAGNOSIS — Z7982 Long term (current) use of aspirin: Secondary | ICD-10-CM | POA: Diagnosis not present

## 2019-12-02 DIAGNOSIS — Z7902 Long term (current) use of antithrombotics/antiplatelets: Secondary | ICD-10-CM | POA: Diagnosis not present

## 2019-12-02 DIAGNOSIS — J449 Chronic obstructive pulmonary disease, unspecified: Secondary | ICD-10-CM | POA: Diagnosis not present

## 2019-12-02 DIAGNOSIS — Z7984 Long term (current) use of oral hypoglycemic drugs: Secondary | ICD-10-CM | POA: Diagnosis not present

## 2019-12-02 DIAGNOSIS — E1159 Type 2 diabetes mellitus with other circulatory complications: Secondary | ICD-10-CM | POA: Diagnosis not present

## 2019-12-02 DIAGNOSIS — Z9981 Dependence on supplemental oxygen: Secondary | ICD-10-CM | POA: Diagnosis not present

## 2019-12-02 DIAGNOSIS — K115 Sialolithiasis: Secondary | ICD-10-CM | POA: Diagnosis not present

## 2019-12-08 ENCOUNTER — Encounter: Payer: Self-pay | Admitting: Vascular Surgery

## 2019-12-08 ENCOUNTER — Other Ambulatory Visit: Payer: Self-pay

## 2019-12-08 ENCOUNTER — Ambulatory Visit (INDEPENDENT_AMBULATORY_CARE_PROVIDER_SITE_OTHER): Payer: PPO | Admitting: Vascular Surgery

## 2019-12-08 DIAGNOSIS — R109 Unspecified abdominal pain: Secondary | ICD-10-CM | POA: Insufficient documentation

## 2019-12-08 DIAGNOSIS — R1084 Generalized abdominal pain: Secondary | ICD-10-CM | POA: Diagnosis not present

## 2019-12-08 NOTE — Progress Notes (Signed)
Patient name: Caitlin Oliver MRN: [id number] DOB: May 22, 1940 Sex: female  REASON FOR VISIT: Evaluate for chronic mesenteric ischemia  HPI: Caitlin Oliver is a 79 y.o. female with multiple medical problems as noted below that presents for evaluation of possible chronic mesenteric ischemia.  She is here with her niece today and describes about 8 to 9 months of abdominal pain.  [name] states the pain is all the time throughout her abdomen and non-focal.  She denies this being worse with food.  She states she has actually gained about 3 to 4 pounds over the last month.  She has had no food fear and this does not appear to be exacerbated by food.  She has been evaluated by GI who got a stat CT scan on 10/29/2019 that showed some soft tissue infiltration within the subcutaneous fat of her left lower quadrant abdominal wall and some cholelithiasis.  She states her pain is ongoing.  GI notes indicate she has a history of PUD but she denies any recent endoscopy.  She states her pain is worse at times when she is bending over.  She is well known by me and previously underwent redo right common carotid artery exposure and retrograde stenting of her innominate and proximal common high-grade stenosis with kissing stent into the right subclavian.  This was done for suspected vertebrobasilar insufficiency and reversed flow in the right vertebral artery.  Dizzines overally remains significantly improved.  Past Medical History:  Diagnosis Date  . Anxiety   . Arthritis   . Cancer ([place])    lung cancer  . Carotid artery disease ([place])   . Cataracts, bilateral   . CHF (congestive heart failure) ([place])   . Claudication ([place])   . COPD (chronic obstructive pulmonary disease) ([place])   . Diabetes mellitus   . Dizzy   . Family history of adverse reaction to anesthesia    Pt nephew has PONV  . GERD (gastroesophageal reflux disease)   . Headache   . History of kidney stones   . Hypercholesteremia   . Hypertension    . 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 Pneumonia   . Stomach ulcer   . Stroke ([place])   . Tobacco abuse   . Wears glasses     Past Surgical History:  Procedure Laterality Date  . ABDOMINAL AORTIC ENDOVASCULAR STENT GRAFT Right 06/19/2019   Procedure: INNOMINATE STENT, RIGHT CAROTID STENT, RIGHT SUBCLAVIAN STENT AND CAROTID CUTDOWN;  Surgeon: Marty Heck, MD;  Location: [place];  Service: Vascular;  Laterality: Right;  . ABDOMINAL HYSTERECTOMY    . APPENDECTOMY    . BREAST REDUCTION SURGERY    . CAROTID ANGIOGRAM N/A 02/25/2013   Procedure: CAROTID ANGIOGRAM;  Surgeon: Lorretta Harp, MD;  [facility];  Service: Cardiovascular;  Laterality: N/A;  . ENDARTERECTOMY Right 03/06/2013   Procedure: ENDARTERECTOMY CAROTID-RIGHT;  Surgeon: Serafina Mitchell, MD;  Location: [place];  Service: Vascular;  Laterality: Right;  . ENDARTERECTOMY Left 05/07/2013   Procedure: LEFT CAROTID ARTERY ENDARTERECTOMY WITH VASCU-GUARD PATCH ANGIOPLASTY ;  Surgeon: Serafina Mitchell, MD;  Location: [place];  Service: Vascular;  Laterality: Left;  . INTRAOPERATIVE ARTERIOGRAM N/A 06/19/2019   Procedure: Clydie Braun;  Surgeon: Marty Heck, MD;  Location: [facility] OR;  Service: Vascular;  Laterality: N/A;  . IR ANGIO INTRA EXTRACRAN SEL COM CAROTID INNOMINATE UNI R MOD SED  05/14/2019  . IR ANGIO VERTEBRAL  SEL SUBCLAVIAN INNOMINATE BILAT MOD SED  05/14/2019  . LOBECTOMY Left 02/03/2016   Procedure: LEFT UPPER LOBECTOMY;  Surgeon: [name], MD;  Location: [place];  Service: Thoracic;  Laterality: Left;  . LOWER EXTREMITY ANGIOGRAM N/A 02/25/2013   Procedure: LOWER EXTREMITY ANGIOGRAM;  Surgeon: Lorretta Harp, MD;  Location: [facility] CATH LAB;  Service: Cardiovascular;  Laterality: N/A;  . LOWER EXTREMITY ANGIOGRAM N/A 07/27/2013   Procedure: LOWER EXTREMITY ANGIOGRAM;  Surgeon: Lorretta Harp, MD;  Location: [facility] CATH LAB;  Service: Cardiovascular;   Laterality: N/A;  . PATCH ANGIOPLASTY Right 03/06/2013   Procedure: PATCH ANGIOPLASTY of Right Carotid Artery using Vascu-Guard Patch;  Surgeon: Serafina Mitchell, MD;  Location: [place];  Service: Vascular;  Laterality: Right;  . PERIPHERAL VASCULAR CATHETERIZATION N/A 11/15/2015   Procedure: Aortic Arch Angiography;  Surgeon: Serafina Mitchell, MD;  Location: [place] CV LAB;  Service: Cardiovascular;  Laterality: N/A;  . PERIPHERAL VASCULAR CATHETERIZATION Bilateral 11/15/2015   Procedure: Carotid Angiography;  Surgeon: Serafina Mitchell, MD;  Location: [place] CV LAB;  Service: Cardiovascular;  Laterality: Bilateral;  . PERIPHERAL VASCULAR CATHETERIZATION Left 11/15/2015   Procedure: Upper Extremity Angiography;  Surgeon: Serafina Mitchell, MD;  Location: [place] CV LAB;  Service: Cardiovascular;  Laterality: Left;  . PERIPHERAL VASCULAR CATHETERIZATION Left 11/15/2015   Procedure: Peripheral Vascular Intervention;  Surgeon: Serafina Mitchell, MD;  Location: [place] CV LAB;  Service: Cardiovascular;  Laterality: Left;  subclavian   . RIGHT HEART CATH N/A 06/16/2019   Procedure: RIGHT HEART CATH;  Surgeon: Jolaine Artist, MD;  Location: [place] CV LAB;  Service: Cardiovascular;  Laterality: N/A;  . RIGHT/LEFT HEART CATH AND CORONARY ANGIOGRAPHY N/A 11/20/2018   Procedure: RIGHT/LEFT HEART CATH AND CORONARY ANGIOGRAPHY;  Surgeon: Nelva Bush, MD;  Location: [place] CV LAB;  Service: Cardiovascular;  Laterality: N/A;  . SALIVARY GLAND SURGERY     scar tissue removed from left saliva glad  . TUBAL LIGATION    . ULTRASOUND GUIDANCE FOR VASCULAR ACCESS Right 06/19/2019   Procedure: Ultrasound Guidance For Vascular Access;  Surgeon: Marty Heck, MD;  Location: [place];  Service: Vascular;  Laterality: Right;  [name] VIDEO ASSISTED THORACOSCOPY (VATS)/WEDGE RESECTION Left 02/03/2016   Procedure: VIDEO ASSISTED THORACOSCOPY;  Surgeon: [name], MD;  Location: [place];  Service:  Thoracic;  Laterality: Left;    Family History  Problem Relation Age of Onset  . Stroke Mother   . Hypertension Mother   . Kidney failure Father     SOCIAL HISTORY: Social History   Tobacco Use  . Smoking status: Former Smoker    Packs/day: 1.50    Years: 56.00    Pack years: 84.00    Types: Cigarettes    Quit date: 07/07/2013    Years since quitting: 6.4  . Smokeless tobacco: Former Systems developer    Quit date: 02/25/2013  Substance Use Topics  . Alcohol use: No    No Known Allergies  Current Outpatient Medications  Medication Sig Dispense Refill  . ALPRAZolam (XANAX) 0.5 MG tablet Take 1-2 tablets 45 minutes before MRI, may take a 3rd if needed 3 tablet 0  . aspirin 81 MG chewable tablet Chew 81 mg by mouth at bedtime.     . calcium carbonate (CALCIUM 600) 600 MG TABS tablet Take 600 mg by mouth daily before supper.     . clopidogrel (PLAVIX) 75 MG tablet Take 1 tablet (75 mg total) by mouth  daily.    . Cyanocobalamin (VITAMIN B-12) 2500 MCG SUBL Take 2,500 mcg by mouth daily.    . diazepam (VALIUM) 5 MG tablet Take one pill per day as needed for anxiety - use sparingly    . FLUoxetine (PROZAC) 10 MG capsule Take one pill per day    . gabapentin (NEURONTIN) 100 MG capsule Take 1 capsule (100 mg total) by mouth 3 (three) times daily. (Patient taking differently: Take 200 mg by mouth in the morning and at bedtime. ) 90 capsule 0  . HYDROcodone-acetaminophen (NORCO/VICODIN) 5-325 MG tablet Take 1 tablet by mouth every 6 (six) hours as needed for moderate pain. 15 tablet 0  . Magnesium 250 MG TABS Take 250 mg by mouth daily.    . meclizine (ANTIVERT) 12.5 MG tablet Take 12.5 mg by mouth 3 (three) times daily as needed for dizziness or nausea.     . Menthol, Topical Analgesic, (ICY HOT EX) Apply 1 spray topically every 8 (eight) hours as needed (pain).    . metFORMIN (GLUCOPHAGE) 500 MG tablet Take by mouth 2 (two) times daily with a meal.    . metolazone (ZAROXOLYN) 5 MG tablet Take  only as directed by the CHF clinic 10 tablet 0  . metoprolol tartrate (LOPRESSOR) 50 MG tablet Take 50 mg by mouth 2 (two) times daily.    . Omega-3 Fatty Acids (FISH OIL) 1200 MG CAPS Take 1,200 mg by mouth daily.    . ondansetron (ZOFRAN) 4 MG tablet Take 4 mg by mouth at bedtime as needed for nausea or vomiting.     . ONE TOUCH ULTRA TEST test strip     . pantoprazole (PROTONIX) 40 MG tablet Take 40 mg by mouth 2 (two) times daily.     . polyethylene glycol (MIRALAX / GLYCOLAX) 17 g packet Take 17 g by mouth at bedtime.     . potassium chloride SA (KLOR-CON) 20 MEQ tablet Take 1 tablet (20 mEq total) by mouth daily. (Patient taking differently: Take 20 mEq by mouth daily with supper. ) 90 tablet 3  . pravastatin (PRAVACHOL) 40 MG tablet Take 20 mg by mouth daily before supper.     . topiramate (TOPAMAX) 50 MG tablet Take 50 mg by mouth 2 (two) times daily.    [name] torsemide (DEMADEX) 20 MG tablet Take 2 tablets (40 mg total) by mouth 2 (two) times daily. 360 tablet 3  . traZODone (DESYREL) 50 MG tablet Take 50 mg by mouth at bedtime.    [name] albuterol (PROVENTIL HFA;VENTOLIN HFA) 108 (90 Base) MCG/ACT inhaler Inhale 2 puffs into the lungs every 6 (six) hours as needed for wheezing or shortness of breath.  (Patient not taking: Reported on 12/08/2019)    . albuterol (PROVENTIL) (2.5 MG/3ML) 0.083% nebulizer solution Take 2.5 mg by nebulization in the morning and at bedtime. (Patient not taking: Reported on 12/08/2019)    . budesonide (PULMICORT) 0.25 MG/2ML nebulizer solution Take 2 mLs (0.25 mg total) by nebulization 2 (two) times daily. (Patient not taking: Reported on 12/08/2019) 60 mL 12   No current facility-administered medications for this visit.    REVIEW OF SYSTEMS:  _0  denotes positive finding, _1  denotes negative finding Cardiac  Comments:  Chest pain or chest pressure:    Shortness of breath upon exertion:    Short of breath when lying flat:    Irregular heart rhythm:        Vascular     Pain in calf, thigh, or hip  brought on by ambulation:    Pain in feet at night that wakes you up from your sleep:     Blood clot in your veins:    Leg swelling:         Pulmonary    Oxygen at home:    Productive cough:     Wheezing:         Neurologic    Sudden weakness in arms or legs:     Sudden numbness in arms or legs:     Sudden onset of difficulty speaking or slurred speech:    Temporary loss of vision in one eye:     Problems with dizziness:         Gastrointestinal    Blood in stool:     Vomited blood:         Genitourinary    Burning when urinating:     Blood in urine:        Psychiatric    Major depression:         Hematologic    Bleeding problems:    Problems with blood clotting too easily:        Skin    Rashes or ulcers:        Constitutional    Fever or chills:      PHYSICAL EXAM: Vitals:   12/08/19 1415  BP: (!) 122/50  Pulse: 68  Resp: 16  Temp: 98.1 F (36.7 C)  TempSrc: Temporal  SpO2: 100%  Weight: 174 lb (78.9 kg)  Height: _0  (1.626 m)    GENERAL: The patient is a well-nourished female, in no acute distress. The vital signs are documented above. CARDIAC: There is a regular rate and rhythm.  VASCULAR:  Palpable right and left radial pulse 1+ palpable bilateral femoral arteries  DATA:   I reviewed her CT from 10/29/2019 and on my review her celiac artery appears widely patent with no flow-limiting stenosis.  She does have some proximal SMA disease but unclear that this is high-grade based on sagittal views.  Assessment/Plan:  79 year old female status post retrograde innominate and proximal right common carotid artery stenting for high-grade stenosis with kissing stent in the right subclavian for vertebrobasilar insufficiency.    She now presents for evaluation of chronic mesenteric ischemia.  She has had 8 to 9 months of abdominal pain and has been evaluated by GI where a stat CT was obtained and then referred back to  vascular surgery.  Is not clear to me that her symptoms are consistent with chronic mesenteric ischemia.  She denies food fear, denies any postprandial pain that is exacerbated with eating and states she is actually gaining weight.  In review of her recent CT her celiac looks widely patent to me and her SMA has some mild to moderate disease and I am not certain this is high-grade flow-limiting stenosis.  I have subsequently recommended a fasting mesenteric duplex here in our office.  I think she is very high risk for mesenteric arteriogram without more work-up to justify intervention given she cannot lay flat and is on oxygen with multiple medical issues as well as has severely diseased iliacs that would make access somewhat difficult and limited.  I will have her follow-up with me after mesenteric duplex to discuss risk benefits of a mesenteric arteriogram.   Marty Heck, MD [facility] Office: [place]

## 2019-12-10 ENCOUNTER — Other Ambulatory Visit: Payer: Self-pay | Admitting: *Deleted

## 2019-12-10 DIAGNOSIS — R1084 Generalized abdominal pain: Secondary | ICD-10-CM

## 2019-12-15 DIAGNOSIS — I509 Heart failure, unspecified: Secondary | ICD-10-CM | POA: Diagnosis not present

## 2019-12-15 DIAGNOSIS — Z7984 Long term (current) use of oral hypoglycemic drugs: Secondary | ICD-10-CM | POA: Diagnosis not present

## 2019-12-15 DIAGNOSIS — F329 Major depressive disorder, single episode, unspecified: Secondary | ICD-10-CM | POA: Diagnosis not present

## 2019-12-15 DIAGNOSIS — F419 Anxiety disorder, unspecified: Secondary | ICD-10-CM | POA: Diagnosis not present

## 2019-12-15 DIAGNOSIS — Z9981 Dependence on supplemental oxygen: Secondary | ICD-10-CM | POA: Diagnosis not present

## 2019-12-15 DIAGNOSIS — L905 Scar conditions and fibrosis of skin: Secondary | ICD-10-CM | POA: Diagnosis not present

## 2019-12-15 DIAGNOSIS — K55059 Acute (reversible) ischemia of intestine, part and extent unspecified: Secondary | ICD-10-CM | POA: Diagnosis not present

## 2019-12-15 DIAGNOSIS — E7800 Pure hypercholesterolemia, unspecified: Secondary | ICD-10-CM | POA: Diagnosis not present

## 2019-12-15 DIAGNOSIS — Z87891 Personal history of nicotine dependence: Secondary | ICD-10-CM | POA: Diagnosis not present

## 2019-12-15 DIAGNOSIS — Z79899 Other long term (current) drug therapy: Secondary | ICD-10-CM | POA: Diagnosis not present

## 2019-12-15 DIAGNOSIS — K115 Sialolithiasis: Secondary | ICD-10-CM | POA: Diagnosis not present

## 2019-12-15 DIAGNOSIS — Z8673 Personal history of transient ischemic attack (TIA), and cerebral infarction without residual deficits: Secondary | ICD-10-CM | POA: Diagnosis not present

## 2019-12-15 DIAGNOSIS — G4700 Insomnia, unspecified: Secondary | ICD-10-CM | POA: Diagnosis not present

## 2019-12-15 DIAGNOSIS — K1123 Chronic sialoadenitis: Secondary | ICD-10-CM | POA: Diagnosis not present

## 2019-12-15 DIAGNOSIS — J449 Chronic obstructive pulmonary disease, unspecified: Secondary | ICD-10-CM | POA: Diagnosis not present

## 2019-12-15 DIAGNOSIS — K219 Gastro-esophageal reflux disease without esophagitis: Secondary | ICD-10-CM | POA: Diagnosis not present

## 2019-12-15 DIAGNOSIS — I110 Hypertensive heart disease with heart failure: Secondary | ICD-10-CM | POA: Diagnosis not present

## 2019-12-15 DIAGNOSIS — D649 Anemia, unspecified: Secondary | ICD-10-CM | POA: Diagnosis not present

## 2019-12-15 DIAGNOSIS — I2510 Atherosclerotic heart disease of native coronary artery without angina pectoris: Secondary | ICD-10-CM | POA: Diagnosis not present

## 2019-12-15 DIAGNOSIS — Z7902 Long term (current) use of antithrombotics/antiplatelets: Secondary | ICD-10-CM | POA: Diagnosis not present

## 2019-12-15 DIAGNOSIS — E119 Type 2 diabetes mellitus without complications: Secondary | ICD-10-CM | POA: Diagnosis not present

## 2019-12-15 DIAGNOSIS — Z9889 Other specified postprocedural states: Secondary | ICD-10-CM | POA: Diagnosis not present

## 2019-12-15 DIAGNOSIS — I272 Pulmonary hypertension, unspecified: Secondary | ICD-10-CM | POA: Diagnosis not present

## 2019-12-22 ENCOUNTER — Ambulatory Visit: Payer: PPO | Admitting: Vascular Surgery

## 2019-12-22 ENCOUNTER — Inpatient Hospital Stay (HOSPITAL_COMMUNITY): Admission: RE | Admit: 2019-12-22 | Payer: PPO | Source: Ambulatory Visit

## 2019-12-28 DIAGNOSIS — Z87891 Personal history of nicotine dependence: Secondary | ICD-10-CM | POA: Diagnosis not present

## 2019-12-28 DIAGNOSIS — Z09 Encounter for follow-up examination after completed treatment for conditions other than malignant neoplasm: Secondary | ICD-10-CM | POA: Diagnosis not present

## 2019-12-28 DIAGNOSIS — K1123 Chronic sialoadenitis: Secondary | ICD-10-CM | POA: Diagnosis not present

## 2019-12-29 DIAGNOSIS — C3492 Malignant neoplasm of unspecified part of left bronchus or lung: Secondary | ICD-10-CM | POA: Diagnosis not present

## 2019-12-29 DIAGNOSIS — I272 Pulmonary hypertension, unspecified: Secondary | ICD-10-CM | POA: Diagnosis not present

## 2019-12-29 DIAGNOSIS — J449 Chronic obstructive pulmonary disease, unspecified: Secondary | ICD-10-CM | POA: Diagnosis not present

## 2019-12-29 DIAGNOSIS — I5043 Acute on chronic combined systolic (congestive) and diastolic (congestive) heart failure: Secondary | ICD-10-CM | POA: Diagnosis not present

## 2019-12-29 DIAGNOSIS — R0902 Hypoxemia: Secondary | ICD-10-CM | POA: Diagnosis not present

## 2019-12-29 DIAGNOSIS — I69398 Other sequelae of cerebral infarction: Secondary | ICD-10-CM | POA: Diagnosis not present

## 2019-12-29 DIAGNOSIS — Z85118 Personal history of other malignant neoplasm of bronchus and lung: Secondary | ICD-10-CM | POA: Diagnosis not present

## 2020-01-12 ENCOUNTER — Ambulatory Visit (HOSPITAL_COMMUNITY)
Admission: RE | Admit: 2020-01-12 | Discharge: 2020-01-12 | Disposition: A | Discharge status: Home / Self Care | Payer: PPO | Source: Ambulatory Visit | Attending: Vascular Surgery | Admitting: Vascular Surgery

## 2020-01-12 ENCOUNTER — Encounter: Payer: Self-pay | Admitting: Vascular Surgery

## 2020-01-12 ENCOUNTER — Ambulatory Visit (INDEPENDENT_AMBULATORY_CARE_PROVIDER_SITE_OTHER): Payer: PPO | Admitting: Vascular Surgery

## 2020-01-12 ENCOUNTER — Other Ambulatory Visit: Payer: Self-pay

## 2020-01-12 VITALS — BP 96/58 | HR 54 | Temp 96.3°F | Resp 16 | Ht 64.0 in | Wt 169.0 lb

## 2020-01-12 DIAGNOSIS — R1084 Generalized abdominal pain: Secondary | ICD-10-CM | POA: Diagnosis not present

## 2020-01-12 NOTE — Progress Notes (Signed)
Patient name: Caitlin Oliver MRN: [id number] DOB: Aug 27, 1940 Sex: female  REASON FOR VISIT: Follow-up to further evaluate for chronic mesenteric ischemia  HPI: Caitlin Oliver is a 79 y.o. female with hx COPD, DM, PAD, LCEA 2015, R CEA 2014, GERD, hyperlipidemia, anxiety, HTN,chronic respiratory failure on home oxygen, lung cancer, S/P L upper lobectomy 2017, and chronic diastolic heart failure that presents for further follow-up of possible chronic mesenteric ischemia.  She was initially here with her niece and described about 8 to 9 months of abdominal pain.  [name] stated the pain was all the time throughout her abdomen and non-focal.  She denied this being worse with food.  She states she had actually gained about 3 to 4 pounds at time of initial evaluation.  She had been evaluated by GI who got a stat CT scan on 10/29/2019 that showed some soft tissue infiltration within the subcutaneous fat of her left lower quadrant abdominal wall and some cholelithiasis.   She is well known by me and previously underwent redo right common carotid artery exposure and retrograde stenting of her innominate and proximal common high-grade stenosis with kissing stent into the right subclavian.  This was done for suspected vertebrobasilar insufficiency and reversed flow in the right vertebral artery.  Dizzines overally remains significantly improved.  On follow-up today she is continuing to have abdominal pain.  She states this is worse when bending over.  She denies this being worse with eating.  States her weight has been stable.  Denies any recent weight loss.  States she looks forward to eating.  Past Medical History:  Diagnosis Date  . Anxiety   . Arthritis   . Cancer ([place])    lung cancer  . Carotid artery disease ([place])   . Cataracts, bilateral   . CHF (congestive heart failure) ([place])   . Claudication ([place])   . COPD (chronic obstructive pulmonary disease) ([place])   . Diabetes mellitus   . Dizzy   .  Family history of adverse reaction to anesthesia    Pt nephew has PONV  . GERD (gastroesophageal reflux disease)   . Headache   . History of kidney stones   . Hypercholesteremia   . Hypertension   . 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 Pneumonia   . Stomach ulcer   . Stroke ([place])   . Tobacco abuse   . Wears glasses     Past Surgical History:  Procedure Laterality Date  . ABDOMINAL AORTIC ENDOVASCULAR STENT GRAFT Right 06/19/2019   Procedure: INNOMINATE STENT, RIGHT CAROTID STENT, RIGHT SUBCLAVIAN STENT AND CAROTID CUTDOWN;  Surgeon: Marty Heck, MD;  Location: [place];  Service: Vascular;  Laterality: Right;  . ABDOMINAL HYSTERECTOMY    . APPENDECTOMY    . BREAST REDUCTION SURGERY    . CAROTID ANGIOGRAM N/A 02/25/2013   Procedure: CAROTID ANGIOGRAM;  Surgeon: Lorretta Harp, MD;  Location: [facility] CATH LAB;  Service: Cardiovascular;  Laterality: N/A;  . ENDARTERECTOMY Right 03/06/2013   Procedure: ENDARTERECTOMY CAROTID-RIGHT;  Surgeon: Serafina Mitchell, MD;  Location: [place];  Service: Vascular;  Laterality: Right;  . ENDARTERECTOMY Left 05/07/2013   Procedure: LEFT CAROTID ARTERY ENDARTERECTOMY WITH VASCU-GUARD PATCH ANGIOPLASTY ;  Surgeon: Serafina Mitchell, MD;  Location: [place];  Service: Vascular;  Laterality: Left;  . INTRAOPERATIVE ARTERIOGRAM N/A 06/19/2019   Procedure: Clydie Braun;  Surgeon: Marty Heck, MD;  Location: [place];  Service: Vascular;  Laterality: N/A;  . IR ANGIO INTRA EXTRACRAN SEL COM CAROTID INNOMINATE UNI R MOD SED  05/14/2019  . IR ANGIO VERTEBRAL SEL SUBCLAVIAN INNOMINATE BILAT MOD SED  05/14/2019  . LOBECTOMY Left 02/03/2016   Procedure: LEFT UPPER LOBECTOMY;  Surgeon: [name], MD;  Location: [place];  Service: Thoracic;  Laterality: Left;  . LOWER EXTREMITY ANGIOGRAM N/A 02/25/2013   Procedure: LOWER EXTREMITY ANGIOGRAM;  Surgeon: Lorretta Harp, MD;  Location: [facility] CATH LAB;  Service:  Cardiovascular;  Laterality: N/A;  . LOWER EXTREMITY ANGIOGRAM N/A 07/27/2013   Procedure: LOWER EXTREMITY ANGIOGRAM;  Surgeon: Lorretta Harp, MD;  Location: [facility] CATH LAB;  Service: Cardiovascular;  Laterality: N/A;  . PATCH ANGIOPLASTY Right 03/06/2013   Procedure: PATCH ANGIOPLASTY of Right Carotid Artery using Vascu-Guard Patch;  Surgeon: Serafina Mitchell, MD;  Location: [place];  Service: Vascular;  Laterality: Right;  . PERIPHERAL VASCULAR CATHETERIZATION N/A 11/15/2015   Procedure: Aortic Arch Angiography;  Surgeon: Serafina Mitchell, MD;  Location: [place] CV LAB;  Service: Cardiovascular;  Laterality: N/A;  . PERIPHERAL VASCULAR CATHETERIZATION Bilateral 11/15/2015   Procedure: Carotid Angiography;  Surgeon: Serafina Mitchell, MD;  Location: [place] CV LAB;  Service: Cardiovascular;  Laterality: Bilateral;  . PERIPHERAL VASCULAR CATHETERIZATION Left 11/15/2015   Procedure: Upper Extremity Angiography;  Surgeon: Serafina Mitchell, MD;  Location: [place] CV LAB;  Service: Cardiovascular;  Laterality: Left;  . PERIPHERAL VASCULAR CATHETERIZATION Left 11/15/2015   Procedure: Peripheral Vascular Intervention;  Surgeon: Serafina Mitchell, MD;  Location: [place] CV LAB;  Service: Cardiovascular;  Laterality: Left;  subclavian   . RIGHT HEART CATH N/A 06/16/2019   Procedure: RIGHT HEART CATH;  Surgeon: Jolaine Artist, MD;  Location: [place] CV LAB;  Service: Cardiovascular;  Laterality: N/A;  . RIGHT/LEFT HEART CATH AND CORONARY ANGIOGRAPHY N/A 11/20/2018   Procedure: RIGHT/LEFT HEART CATH AND CORONARY ANGIOGRAPHY;  Surgeon: Nelva Bush, MD;  Location: [place] CV LAB;  Service: Cardiovascular;  Laterality: N/A;  . SALIVARY GLAND SURGERY     scar tissue removed from left saliva glad  . TUBAL LIGATION    . ULTRASOUND GUIDANCE FOR VASCULAR ACCESS Right 06/19/2019   Procedure: Ultrasound Guidance For Vascular Access;  Surgeon: Marty Heck, MD;  Location: [place];  Service: Vascular;   Laterality: Right;  [name] VIDEO ASSISTED THORACOSCOPY (VATS)/WEDGE RESECTION Left 02/03/2016   Procedure: VIDEO ASSISTED THORACOSCOPY;  Surgeon: [name], MD;  Location: [place];  Service: Thoracic;  Laterality: Left;    Family History  Problem Relation Age of Onset  . Stroke Mother   . Hypertension Mother   . Kidney failure Father     SOCIAL HISTORY: Social History   Tobacco Use  . Smoking status: Former Smoker    Packs/day: 1.50    Years: 56.00    Pack years: 84.00    Types: Cigarettes    Quit date: 07/07/2013    Years since quitting: 6.5  . Smokeless tobacco: Former Systems developer    Quit date: 02/25/2013  Substance Use Topics  . Alcohol use: No    No Known Allergies  Current Outpatient Medications  Medication Sig Dispense Refill  . albuterol (PROVENTIL HFA;VENTOLIN HFA) 108 (90 Base) MCG/ACT inhaler Inhale 2 puffs into the lungs every 6 (six) hours as needed for wheezing or shortness of breath.  (Patient not taking: Reported on 12/08/2019)    . albuterol (PROVENTIL) (2.5 MG/3ML) 0.083% nebulizer solution Take 2.5 mg by nebulization in  the morning and at bedtime. (Patient not taking: Reported on 12/08/2019)    . ALPRAZolam (XANAX) 0.5 MG tablet Take 1-2 tablets 45 minutes before MRI, may take a 3rd if needed 3 tablet 0  . aspirin 81 MG chewable tablet Chew 81 mg by mouth at bedtime.     . budesonide (PULMICORT) 0.25 MG/2ML nebulizer solution Take 2 mLs (0.25 mg total) by nebulization 2 (two) times daily. (Patient not taking: Reported on 12/08/2019) 60 mL 12  . calcium carbonate (CALCIUM 600) 600 MG TABS tablet Take 600 mg by mouth daily before supper.     . clopidogrel (PLAVIX) 75 MG tablet Take 1 tablet (75 mg total) by mouth daily.    . Cyanocobalamin (VITAMIN B-12) 2500 MCG SUBL Take 2,500 mcg by mouth daily.    . diazepam (VALIUM) 5 MG tablet Take one pill per day as needed for anxiety - use sparingly    . FLUoxetine (PROZAC) 10 MG capsule Take one pill per day    .  gabapentin (NEURONTIN) 100 MG capsule Take 1 capsule (100 mg total) by mouth 3 (three) times daily. (Patient taking differently: Take 200 mg by mouth in the morning and at bedtime. ) 90 capsule 0  . HYDROcodone-acetaminophen (NORCO/VICODIN) 5-325 MG tablet Take 1 tablet by mouth every 6 (six) hours as needed for moderate pain. 15 tablet 0  . Magnesium 250 MG TABS Take 250 mg by mouth daily.    . meclizine (ANTIVERT) 12.5 MG tablet Take 12.5 mg by mouth 3 (three) times daily as needed for dizziness or nausea.     . Menthol, Topical Analgesic, (ICY HOT EX) Apply 1 spray topically every 8 (eight) hours as needed (pain).    . metFORMIN (GLUCOPHAGE) 500 MG tablet Take by mouth 2 (two) times daily with a meal.    . metolazone (ZAROXOLYN) 5 MG tablet Take only as directed by the CHF clinic 10 tablet 0  . metoprolol tartrate (LOPRESSOR) 50 MG tablet Take 50 mg by mouth 2 (two) times daily.    . Omega-3 Fatty Acids (FISH OIL) 1200 MG CAPS Take 1,200 mg by mouth daily.    . ondansetron (ZOFRAN) 4 MG tablet Take 4 mg by mouth at bedtime as needed for nausea or vomiting.     . ONE TOUCH ULTRA TEST test strip     . pantoprazole (PROTONIX) 40 MG tablet Take 40 mg by mouth 2 (two) times daily.     . polyethylene glycol (MIRALAX / GLYCOLAX) 17 g packet Take 17 g by mouth at bedtime.     . potassium chloride SA (KLOR-CON) 20 MEQ tablet Take 1 tablet (20 mEq total) by mouth daily. (Patient taking differently: Take 20 mEq by mouth daily with supper. ) 90 tablet 3  . pravastatin (PRAVACHOL) 40 MG tablet Take 20 mg by mouth daily before supper.     . topiramate (TOPAMAX) 50 MG tablet Take 50 mg by mouth 2 (two) times daily.    [name] torsemide (DEMADEX) 20 MG tablet Take 2 tablets (40 mg total) by mouth 2 (two) times daily. 360 tablet 3  . traZODone (DESYREL) 50 MG tablet Take 50 mg by mouth at bedtime.     No current facility-administered medications for this visit.    REVIEW OF SYSTEMS:  _0  denotes positive finding,  _1  denotes negative finding Cardiac  Comments:  Chest pain or chest pressure:    Shortness of breath upon exertion:    Short of breath when lying flat:  Irregular heart rhythm:        Vascular    Pain in calf, thigh, or hip brought on by ambulation:    Pain in feet at night that wakes you up from your sleep:     Blood clot in your veins:    Leg swelling:         Pulmonary    Oxygen at home:    Productive cough:     Wheezing:         Neurologic    Sudden weakness in arms or legs:     Sudden numbness in arms or legs:     Sudden onset of difficulty speaking or slurred speech:    Temporary loss of vision in one eye:     Problems with dizziness:         Gastrointestinal    Blood in stool:     Vomited blood:         Genitourinary    Burning when urinating:     Blood in urine:        Psychiatric    Major depression:         Hematologic    Bleeding problems:    Problems with blood clotting too easily:        Skin    Rashes or ulcers:        Constitutional    Fever or chills:      PHYSICAL EXAM: There were no vitals filed for this visit.  GENERAL: The patient is a well-nourished female, in no acute distress. The vital signs are documented above. CARDIAC: There is a regular rate and rhythm.  VASCULAR:  Palpable right and left brachial pulse 1+ palpable bilateral femoral arteries  DATA:   I reviewed her CT from 10/29/2019 and on my review her celiac artery appears widely patent with no flow-limiting stenosis.  She does have some proximal SMA disease.  Mesenteric duplex today has evidence of greater than 70% SMA stenosis.  Assessment/Plan:  79 year old female status post retrograde innominate and proximal right common carotid artery stenting for high-grade stenosis with kissing stent in the right subclavian for vertebrobasilar insufficiency.    She now presents for mesenteric duplex and further evaluation of chronic mesenteric ischemia.  I brought her back  today for mesenteric duplex that does confirm a high-grade SMA stenosis.  I think her celiac is patent based on previous CT scan although this was not visualized today on mesenteric duplex.  I spent a lot of time talking to her about her symptoms and I do not think her symptoms are consistent with chronic mesenteric ischemia.  She states her symptoms are much worse when bending over.  She denies any food fear.  She states her pain is not worse with eating.  She states her weight has been stable and she actually gained weight earlier this year.  She is very high risk for any surgical intervention given all of her comorbidities and its not clear to me that a mesenteric intervention is going to alleviate her symptoms at this point.  I have suggested that she follow-up with [facility] again to see if they have any other ideas about the etiology for her abdominal pain.  If they feel strongly that this is mesenteric ischemia I am going to see her again after she is evaluated by her gastroenterologist and we can further discuss a mesenteric intervention but again I do not think her symptoms correlate based on her description.  Marty Heck, MD [facility] Office: [place]

## 2020-01-28 DIAGNOSIS — J449 Chronic obstructive pulmonary disease, unspecified: Secondary | ICD-10-CM | POA: Diagnosis not present

## 2020-01-28 DIAGNOSIS — Z85118 Personal history of other malignant neoplasm of bronchus and lung: Secondary | ICD-10-CM | POA: Diagnosis not present

## 2020-01-28 DIAGNOSIS — I272 Pulmonary hypertension, unspecified: Secondary | ICD-10-CM | POA: Diagnosis not present

## 2020-01-28 DIAGNOSIS — I69398 Other sequelae of cerebral infarction: Secondary | ICD-10-CM | POA: Diagnosis not present

## 2020-02-08 DIAGNOSIS — R10817 Generalized abdominal tenderness: Secondary | ICD-10-CM | POA: Diagnosis not present

## 2020-02-16 ENCOUNTER — Ambulatory Visit: Payer: PPO | Admitting: Vascular Surgery

## 2020-02-28 DIAGNOSIS — I69398 Other sequelae of cerebral infarction: Secondary | ICD-10-CM | POA: Diagnosis not present

## 2020-02-28 DIAGNOSIS — Z85118 Personal history of other malignant neoplasm of bronchus and lung: Secondary | ICD-10-CM | POA: Diagnosis not present

## 2020-02-28 DIAGNOSIS — I272 Pulmonary hypertension, unspecified: Secondary | ICD-10-CM | POA: Diagnosis not present

## 2020-02-28 DIAGNOSIS — J449 Chronic obstructive pulmonary disease, unspecified: Secondary | ICD-10-CM | POA: Diagnosis not present

## 2020-03-08 DIAGNOSIS — F32A Depression, unspecified: Secondary | ICD-10-CM | POA: Diagnosis not present

## 2020-03-08 DIAGNOSIS — I739 Peripheral vascular disease, unspecified: Secondary | ICD-10-CM | POA: Diagnosis not present

## 2020-03-08 DIAGNOSIS — K219 Gastro-esophageal reflux disease without esophagitis: Secondary | ICD-10-CM | POA: Diagnosis not present

## 2020-03-08 DIAGNOSIS — F419 Anxiety disorder, unspecified: Secondary | ICD-10-CM | POA: Diagnosis not present

## 2020-03-08 DIAGNOSIS — I5032 Chronic diastolic (congestive) heart failure: Secondary | ICD-10-CM | POA: Diagnosis not present

## 2020-03-08 DIAGNOSIS — Z23 Encounter for immunization: Secondary | ICD-10-CM | POA: Diagnosis not present

## 2020-03-08 DIAGNOSIS — E119 Type 2 diabetes mellitus without complications: Secondary | ICD-10-CM | POA: Diagnosis not present

## 2020-03-08 DIAGNOSIS — E785 Hyperlipidemia, unspecified: Secondary | ICD-10-CM | POA: Diagnosis not present

## 2020-03-17 DIAGNOSIS — K588 Other irritable bowel syndrome: Secondary | ICD-10-CM | POA: Diagnosis not present

## 2020-03-17 DIAGNOSIS — R1084 Generalized abdominal pain: Secondary | ICD-10-CM | POA: Diagnosis not present

## 2020-03-29 DIAGNOSIS — Z85118 Personal history of other malignant neoplasm of bronchus and lung: Secondary | ICD-10-CM | POA: Diagnosis not present

## 2020-03-29 DIAGNOSIS — J449 Chronic obstructive pulmonary disease, unspecified: Secondary | ICD-10-CM | POA: Diagnosis not present

## 2020-03-29 DIAGNOSIS — I272 Pulmonary hypertension, unspecified: Secondary | ICD-10-CM | POA: Diagnosis not present

## 2020-03-29 DIAGNOSIS — I69398 Other sequelae of cerebral infarction: Secondary | ICD-10-CM | POA: Diagnosis not present

## 2020-04-18 ENCOUNTER — Other Ambulatory Visit (HOSPITAL_COMMUNITY): Payer: Self-pay | Admitting: Internal Medicine

## 2020-04-21 ENCOUNTER — Other Ambulatory Visit (HOSPITAL_COMMUNITY): Payer: Self-pay | Admitting: Internal Medicine

## 2020-04-29 DIAGNOSIS — Z85118 Personal history of other malignant neoplasm of bronchus and lung: Secondary | ICD-10-CM | POA: Diagnosis not present

## 2020-04-29 DIAGNOSIS — I272 Pulmonary hypertension, unspecified: Secondary | ICD-10-CM | POA: Diagnosis not present

## 2020-04-29 DIAGNOSIS — J449 Chronic obstructive pulmonary disease, unspecified: Secondary | ICD-10-CM | POA: Diagnosis not present

## 2020-04-29 DIAGNOSIS — I69398 Other sequelae of cerebral infarction: Secondary | ICD-10-CM | POA: Diagnosis not present

## 2020-05-30 DIAGNOSIS — Z85118 Personal history of other malignant neoplasm of bronchus and lung: Secondary | ICD-10-CM | POA: Diagnosis not present

## 2020-05-30 DIAGNOSIS — R519 Headache, unspecified: Secondary | ICD-10-CM | POA: Diagnosis not present

## 2020-05-30 DIAGNOSIS — J449 Chronic obstructive pulmonary disease, unspecified: Secondary | ICD-10-CM | POA: Diagnosis not present

## 2020-05-30 DIAGNOSIS — I69398 Other sequelae of cerebral infarction: Secondary | ICD-10-CM | POA: Diagnosis not present

## 2020-05-30 DIAGNOSIS — I272 Pulmonary hypertension, unspecified: Secondary | ICD-10-CM | POA: Diagnosis not present

## 2020-05-30 DIAGNOSIS — I10 Essential (primary) hypertension: Secondary | ICD-10-CM | POA: Diagnosis not present

## 2020-06-16 DIAGNOSIS — R1319 Other dysphagia: Secondary | ICD-10-CM | POA: Diagnosis not present

## 2020-06-16 DIAGNOSIS — K551 Chronic vascular disorders of intestine: Secondary | ICD-10-CM | POA: Diagnosis not present

## 2020-06-16 DIAGNOSIS — R109 Unspecified abdominal pain: Secondary | ICD-10-CM | POA: Diagnosis not present

## 2020-06-27 DIAGNOSIS — I69398 Other sequelae of cerebral infarction: Secondary | ICD-10-CM | POA: Diagnosis not present

## 2020-06-27 DIAGNOSIS — I272 Pulmonary hypertension, unspecified: Secondary | ICD-10-CM | POA: Diagnosis not present

## 2020-06-27 DIAGNOSIS — J449 Chronic obstructive pulmonary disease, unspecified: Secondary | ICD-10-CM | POA: Diagnosis not present

## 2020-06-27 DIAGNOSIS — Z85118 Personal history of other malignant neoplasm of bronchus and lung: Secondary | ICD-10-CM | POA: Diagnosis not present

## 2020-06-29 DIAGNOSIS — E113291 Type 2 diabetes mellitus with mild nonproliferative diabetic retinopathy without macular edema, right eye: Secondary | ICD-10-CM | POA: Diagnosis not present

## 2020-06-29 DIAGNOSIS — H527 Unspecified disorder of refraction: Secondary | ICD-10-CM | POA: Diagnosis not present

## 2020-06-29 DIAGNOSIS — H52203 Unspecified astigmatism, bilateral: Secondary | ICD-10-CM | POA: Diagnosis not present

## 2020-06-29 DIAGNOSIS — H40003 Preglaucoma, unspecified, bilateral: Secondary | ICD-10-CM | POA: Diagnosis not present

## 2020-06-29 DIAGNOSIS — D3131 Benign neoplasm of right choroid: Secondary | ICD-10-CM | POA: Diagnosis not present

## 2020-06-29 DIAGNOSIS — H25813 Combined forms of age-related cataract, bilateral: Secondary | ICD-10-CM | POA: Diagnosis not present

## 2020-07-28 DIAGNOSIS — I69398 Other sequelae of cerebral infarction: Secondary | ICD-10-CM | POA: Diagnosis not present

## 2020-07-28 DIAGNOSIS — Z85118 Personal history of other malignant neoplasm of bronchus and lung: Secondary | ICD-10-CM | POA: Diagnosis not present

## 2020-07-28 DIAGNOSIS — J449 Chronic obstructive pulmonary disease, unspecified: Secondary | ICD-10-CM | POA: Diagnosis not present

## 2020-07-28 DIAGNOSIS — I272 Pulmonary hypertension, unspecified: Secondary | ICD-10-CM | POA: Diagnosis not present

## 2020-08-05 DIAGNOSIS — H52203 Unspecified astigmatism, bilateral: Secondary | ICD-10-CM | POA: Diagnosis not present

## 2020-08-08 ENCOUNTER — Other Ambulatory Visit (HOSPITAL_COMMUNITY): Payer: Self-pay | Admitting: *Deleted

## 2020-08-08 MED ORDER — POTASSIUM CHLORIDE 20 MEQ PO PACK: 20.0000 meq | PACK | Freq: Every day | ORAL | 3 refills | Status: DC

## 2020-08-16 DIAGNOSIS — K219 Gastro-esophageal reflux disease without esophagitis: Secondary | ICD-10-CM | POA: Diagnosis not present

## 2020-08-16 DIAGNOSIS — E1136 Type 2 diabetes mellitus with diabetic cataract: Secondary | ICD-10-CM | POA: Diagnosis not present

## 2020-08-16 DIAGNOSIS — Z87891 Personal history of nicotine dependence: Secondary | ICD-10-CM | POA: Diagnosis not present

## 2020-08-16 DIAGNOSIS — I2510 Atherosclerotic heart disease of native coronary artery without angina pectoris: Secondary | ICD-10-CM | POA: Diagnosis not present

## 2020-08-16 DIAGNOSIS — N189 Chronic kidney disease, unspecified: Secondary | ICD-10-CM | POA: Diagnosis not present

## 2020-08-16 DIAGNOSIS — I130 Hypertensive heart and chronic kidney disease with heart failure and stage 1 through stage 4 chronic kidney disease, or unspecified chronic kidney disease: Secondary | ICD-10-CM | POA: Diagnosis not present

## 2020-08-16 DIAGNOSIS — Z8673 Personal history of transient ischemic attack (TIA), and cerebral infarction without residual deficits: Secondary | ICD-10-CM | POA: Diagnosis not present

## 2020-08-16 DIAGNOSIS — J449 Chronic obstructive pulmonary disease, unspecified: Secondary | ICD-10-CM | POA: Diagnosis not present

## 2020-08-16 DIAGNOSIS — I5020 Unspecified systolic (congestive) heart failure: Secondary | ICD-10-CM | POA: Diagnosis not present

## 2020-08-16 DIAGNOSIS — H52203 Unspecified astigmatism, bilateral: Secondary | ICD-10-CM | POA: Diagnosis not present

## 2020-08-16 DIAGNOSIS — E785 Hyperlipidemia, unspecified: Secondary | ICD-10-CM | POA: Diagnosis not present

## 2020-08-16 DIAGNOSIS — Z85118 Personal history of other malignant neoplasm of bronchus and lung: Secondary | ICD-10-CM | POA: Diagnosis not present

## 2020-08-16 DIAGNOSIS — M1990 Unspecified osteoarthritis, unspecified site: Secondary | ICD-10-CM | POA: Diagnosis not present

## 2020-08-16 DIAGNOSIS — I509 Heart failure, unspecified: Secondary | ICD-10-CM | POA: Diagnosis not present

## 2020-08-16 DIAGNOSIS — H25812 Combined forms of age-related cataract, left eye: Secondary | ICD-10-CM | POA: Diagnosis not present

## 2020-08-16 DIAGNOSIS — I739 Peripheral vascular disease, unspecified: Secondary | ICD-10-CM | POA: Diagnosis not present

## 2020-08-16 DIAGNOSIS — Z85828 Personal history of other malignant neoplasm of skin: Secondary | ICD-10-CM | POA: Diagnosis not present

## 2020-08-17 ENCOUNTER — Other Ambulatory Visit (HOSPITAL_COMMUNITY): Payer: Self-pay | Admitting: Internal Medicine

## 2020-08-23 DIAGNOSIS — Z9981 Dependence on supplemental oxygen: Secondary | ICD-10-CM | POA: Diagnosis not present

## 2020-08-23 DIAGNOSIS — H25813 Combined forms of age-related cataract, bilateral: Secondary | ICD-10-CM | POA: Diagnosis not present

## 2020-08-23 DIAGNOSIS — H52203 Unspecified astigmatism, bilateral: Secondary | ICD-10-CM | POA: Diagnosis not present

## 2020-08-23 DIAGNOSIS — E1136 Type 2 diabetes mellitus with diabetic cataract: Secondary | ICD-10-CM | POA: Diagnosis not present

## 2020-08-23 DIAGNOSIS — Z8673 Personal history of transient ischemic attack (TIA), and cerebral infarction without residual deficits: Secondary | ICD-10-CM | POA: Diagnosis not present

## 2020-08-23 DIAGNOSIS — Z955 Presence of coronary angioplasty implant and graft: Secondary | ICD-10-CM | POA: Diagnosis not present

## 2020-08-23 DIAGNOSIS — H25811 Combined forms of age-related cataract, right eye: Secondary | ICD-10-CM | POA: Diagnosis not present

## 2020-08-23 DIAGNOSIS — I2510 Atherosclerotic heart disease of native coronary artery without angina pectoris: Secondary | ICD-10-CM | POA: Diagnosis not present

## 2020-08-23 DIAGNOSIS — J449 Chronic obstructive pulmonary disease, unspecified: Secondary | ICD-10-CM | POA: Diagnosis not present

## 2020-08-23 DIAGNOSIS — K219 Gastro-esophageal reflux disease without esophagitis: Secondary | ICD-10-CM | POA: Diagnosis not present

## 2020-08-23 DIAGNOSIS — I5020 Unspecified systolic (congestive) heart failure: Secondary | ICD-10-CM | POA: Diagnosis not present

## 2020-08-23 DIAGNOSIS — H527 Unspecified disorder of refraction: Secondary | ICD-10-CM | POA: Diagnosis not present

## 2020-08-23 DIAGNOSIS — Z7902 Long term (current) use of antithrombotics/antiplatelets: Secondary | ICD-10-CM | POA: Diagnosis not present

## 2020-08-23 DIAGNOSIS — E1122 Type 2 diabetes mellitus with diabetic chronic kidney disease: Secondary | ICD-10-CM | POA: Diagnosis not present

## 2020-08-23 DIAGNOSIS — E785 Hyperlipidemia, unspecified: Secondary | ICD-10-CM | POA: Diagnosis not present

## 2020-08-23 DIAGNOSIS — Z79899 Other long term (current) drug therapy: Secondary | ICD-10-CM | POA: Diagnosis not present

## 2020-08-23 DIAGNOSIS — H40003 Preglaucoma, unspecified, bilateral: Secondary | ICD-10-CM | POA: Diagnosis not present

## 2020-08-23 DIAGNOSIS — I130 Hypertensive heart and chronic kidney disease with heart failure and stage 1 through stage 4 chronic kidney disease, or unspecified chronic kidney disease: Secondary | ICD-10-CM | POA: Diagnosis not present

## 2020-08-23 DIAGNOSIS — I509 Heart failure, unspecified: Secondary | ICD-10-CM | POA: Diagnosis not present

## 2020-08-23 DIAGNOSIS — I110 Hypertensive heart disease with heart failure: Secondary | ICD-10-CM | POA: Diagnosis not present

## 2020-08-23 DIAGNOSIS — Z87891 Personal history of nicotine dependence: Secondary | ICD-10-CM | POA: Diagnosis not present

## 2020-08-23 DIAGNOSIS — I129 Hypertensive chronic kidney disease with stage 1 through stage 4 chronic kidney disease, or unspecified chronic kidney disease: Secondary | ICD-10-CM | POA: Diagnosis not present

## 2020-08-24 ENCOUNTER — Other Ambulatory Visit (HOSPITAL_COMMUNITY): Payer: Self-pay | Admitting: *Deleted

## 2020-08-24 MED ORDER — POTASSIUM CHLORIDE 20 MEQ PO PACK: 20.0000 meq | PACK | Freq: Every day | ORAL | 3 refills | Status: DC

## 2020-08-25 ENCOUNTER — Other Ambulatory Visit (HOSPITAL_COMMUNITY): Payer: Self-pay | Admitting: *Deleted

## 2020-08-25 MED ORDER — POTASSIUM CHLORIDE 20 MEQ PO PACK: 20.0000 meq | PACK | Freq: Every day | ORAL | 3 refills | Status: DC

## 2020-08-27 DIAGNOSIS — I272 Pulmonary hypertension, unspecified: Secondary | ICD-10-CM | POA: Diagnosis not present

## 2020-08-27 DIAGNOSIS — I69398 Other sequelae of cerebral infarction: Secondary | ICD-10-CM | POA: Diagnosis not present

## 2020-08-27 DIAGNOSIS — J449 Chronic obstructive pulmonary disease, unspecified: Secondary | ICD-10-CM | POA: Diagnosis not present

## 2020-08-27 DIAGNOSIS — Z85118 Personal history of other malignant neoplasm of bronchus and lung: Secondary | ICD-10-CM | POA: Diagnosis not present

## 2020-09-02 ENCOUNTER — Other Ambulatory Visit (HOSPITAL_COMMUNITY): Payer: Self-pay | Admitting: *Deleted

## 2020-09-02 MED ORDER — POTASSIUM CHLORIDE ER 10 MEQ PO TBCR: 20.0000 meq | EXTENDED_RELEASE_TABLET | Freq: Every day | ORAL | 3 refills | Status: DC

## 2020-09-27 DIAGNOSIS — J449 Chronic obstructive pulmonary disease, unspecified: Secondary | ICD-10-CM | POA: Diagnosis not present

## 2020-09-27 DIAGNOSIS — I272 Pulmonary hypertension, unspecified: Secondary | ICD-10-CM | POA: Diagnosis not present

## 2020-09-27 DIAGNOSIS — Z85118 Personal history of other malignant neoplasm of bronchus and lung: Secondary | ICD-10-CM | POA: Diagnosis not present

## 2020-09-27 DIAGNOSIS — I69398 Other sequelae of cerebral infarction: Secondary | ICD-10-CM | POA: Diagnosis not present

## 2020-09-29 DIAGNOSIS — R251 Tremor, unspecified: Secondary | ICD-10-CM | POA: Diagnosis not present

## 2020-09-29 DIAGNOSIS — J449 Chronic obstructive pulmonary disease, unspecified: Secondary | ICD-10-CM | POA: Diagnosis not present

## 2020-09-29 DIAGNOSIS — I119 Hypertensive heart disease without heart failure: Secondary | ICD-10-CM | POA: Diagnosis not present

## 2020-09-29 DIAGNOSIS — I779 Disorder of arteries and arterioles, unspecified: Secondary | ICD-10-CM | POA: Diagnosis not present

## 2020-09-29 DIAGNOSIS — E119 Type 2 diabetes mellitus without complications: Secondary | ICD-10-CM | POA: Diagnosis not present

## 2020-09-29 DIAGNOSIS — K219 Gastro-esophageal reflux disease without esophagitis: Secondary | ICD-10-CM | POA: Diagnosis not present

## 2020-09-29 DIAGNOSIS — I5032 Chronic diastolic (congestive) heart failure: Secondary | ICD-10-CM | POA: Diagnosis not present

## 2020-09-29 DIAGNOSIS — Z0000 Encounter for general adult medical examination without abnormal findings: Secondary | ICD-10-CM | POA: Diagnosis not present

## 2020-09-29 DIAGNOSIS — G629 Polyneuropathy, unspecified: Secondary | ICD-10-CM | POA: Diagnosis not present

## 2020-09-29 DIAGNOSIS — E785 Hyperlipidemia, unspecified: Secondary | ICD-10-CM | POA: Diagnosis not present

## 2020-09-29 DIAGNOSIS — F32A Depression, unspecified: Secondary | ICD-10-CM | POA: Diagnosis not present

## 2020-10-12 DIAGNOSIS — Z23 Encounter for immunization: Secondary | ICD-10-CM | POA: Diagnosis not present

## 2020-10-14 ENCOUNTER — Other Ambulatory Visit (HOSPITAL_COMMUNITY): Payer: Self-pay | Admitting: Internal Medicine

## 2020-10-27 DIAGNOSIS — J449 Chronic obstructive pulmonary disease, unspecified: Secondary | ICD-10-CM | POA: Diagnosis not present

## 2020-10-27 DIAGNOSIS — I69398 Other sequelae of cerebral infarction: Secondary | ICD-10-CM | POA: Diagnosis not present

## 2020-10-27 DIAGNOSIS — I272 Pulmonary hypertension, unspecified: Secondary | ICD-10-CM | POA: Diagnosis not present

## 2020-10-27 DIAGNOSIS — Z85118 Personal history of other malignant neoplasm of bronchus and lung: Secondary | ICD-10-CM | POA: Diagnosis not present

## 2020-10-27 DIAGNOSIS — K1120 Sialoadenitis, unspecified: Secondary | ICD-10-CM | POA: Diagnosis not present

## 2020-10-28 ENCOUNTER — Encounter (HOSPITAL_COMMUNITY): Payer: PPO | Admitting: Internal Medicine

## 2020-11-02 ENCOUNTER — Other Ambulatory Visit: Payer: Self-pay

## 2020-11-02 ENCOUNTER — Encounter (HOSPITAL_COMMUNITY): Payer: Self-pay | Admitting: Internal Medicine

## 2020-11-02 ENCOUNTER — Ambulatory Visit (HOSPITAL_COMMUNITY)
Admission: RE | Admit: 2020-11-02 | Discharge: 2020-11-02 | Disposition: A | Discharge status: Home / Self Care | Payer: PPO | Source: Ambulatory Visit | Attending: Internal Medicine | Admitting: Internal Medicine

## 2020-11-02 VITALS — BP 104/60 | HR 68 | Wt 170.2 lb

## 2020-11-02 DIAGNOSIS — Z8249 Family history of ischemic heart disease and other diseases of the circulatory system: Secondary | ICD-10-CM | POA: Insufficient documentation

## 2020-11-02 DIAGNOSIS — R42 Dizziness and giddiness: Secondary | ICD-10-CM | POA: Diagnosis not present

## 2020-11-02 DIAGNOSIS — E785 Hyperlipidemia, unspecified: Secondary | ICD-10-CM | POA: Insufficient documentation

## 2020-11-02 DIAGNOSIS — I739 Peripheral vascular disease, unspecified: Secondary | ICD-10-CM | POA: Diagnosis not present

## 2020-11-02 DIAGNOSIS — Z9981 Dependence on supplemental oxygen: Secondary | ICD-10-CM | POA: Insufficient documentation

## 2020-11-02 DIAGNOSIS — I2721 Secondary pulmonary arterial hypertension: Secondary | ICD-10-CM

## 2020-11-02 DIAGNOSIS — Z902 Acquired absence of lung [part of]: Secondary | ICD-10-CM | POA: Diagnosis not present

## 2020-11-02 DIAGNOSIS — I272 Pulmonary hypertension, unspecified: Secondary | ICD-10-CM | POA: Diagnosis not present

## 2020-11-02 DIAGNOSIS — J9611 Chronic respiratory failure with hypoxia: Secondary | ICD-10-CM | POA: Insufficient documentation

## 2020-11-02 DIAGNOSIS — Z85118 Personal history of other malignant neoplasm of bronchus and lung: Secondary | ICD-10-CM | POA: Diagnosis not present

## 2020-11-02 DIAGNOSIS — Z885 Allergy status to narcotic agent status: Secondary | ICD-10-CM | POA: Diagnosis not present

## 2020-11-02 DIAGNOSIS — E1151 Type 2 diabetes mellitus with diabetic peripheral angiopathy without gangrene: Secondary | ICD-10-CM | POA: Diagnosis not present

## 2020-11-02 DIAGNOSIS — Z7902 Long term (current) use of antithrombotics/antiplatelets: Secondary | ICD-10-CM | POA: Diagnosis not present

## 2020-11-02 DIAGNOSIS — E1136 Type 2 diabetes mellitus with diabetic cataract: Secondary | ICD-10-CM | POA: Insufficient documentation

## 2020-11-02 DIAGNOSIS — Z8673 Personal history of transient ischemic attack (TIA), and cerebral infarction without residual deficits: Secondary | ICD-10-CM | POA: Insufficient documentation

## 2020-11-02 DIAGNOSIS — Z87891 Personal history of nicotine dependence: Secondary | ICD-10-CM | POA: Diagnosis not present

## 2020-11-02 DIAGNOSIS — J449 Chronic obstructive pulmonary disease, unspecified: Secondary | ICD-10-CM | POA: Diagnosis not present

## 2020-11-02 DIAGNOSIS — K219 Gastro-esophageal reflux disease without esophagitis: Secondary | ICD-10-CM | POA: Insufficient documentation

## 2020-11-02 DIAGNOSIS — I5032 Chronic diastolic (congestive) heart failure: Secondary | ICD-10-CM | POA: Diagnosis not present

## 2020-11-02 DIAGNOSIS — I110 Hypertensive heart disease with heart failure: Secondary | ICD-10-CM | POA: Insufficient documentation

## 2020-11-02 DIAGNOSIS — Z7984 Long term (current) use of oral hypoglycemic drugs: Secondary | ICD-10-CM | POA: Diagnosis not present

## 2020-11-02 DIAGNOSIS — I5042 Chronic combined systolic (congestive) and diastolic (congestive) heart failure: Secondary | ICD-10-CM

## 2020-11-02 LAB — COMPREHENSIVE METABOLIC PANEL
ALT: 27 U/L (ref 0–44)
AST: 25 U/L (ref 15–41)
Albumin: 3.9 g/dL (ref 3.5–5.0)
Alkaline Phosphatase: 70 U/L (ref 38–126)
Anion gap: 8 (ref 5–15)
BUN: 19 mg/dL (ref 8–23)
CO2: 36 mmol/L — ABNORMAL HIGH (ref 22–32)
Calcium: 9.2 mg/dL (ref 8.9–10.3)
Chloride: 92 mmol/L — ABNORMAL LOW (ref 98–111)
Creatinine, Ser: 1.33 mg/dL — ABNORMAL HIGH (ref 0.44–1.00)
GFR, Estimated: 40 mL/min — ABNORMAL LOW (ref >60)
Glucose, Bld: 155 mg/dL — ABNORMAL HIGH (ref 70–99)
Potassium: 3.9 mmol/L (ref 3.5–5.1)
Sodium: 136 mmol/L (ref 135–145)
Total Bilirubin: 0.6 mg/dL (ref 0.3–1.2)
Total Protein: 7 g/dL (ref 6.5–8.1)

## 2020-11-02 LAB — BRAIN NATRIURETIC PEPTIDE: B Natriuretic Peptide: 69.7 pg/mL (ref 0.0–100.0)

## 2020-11-02 MED ORDER — SPIRONOLACTONE 25 MG PO TABS: 12.5000 mg | ORAL_TABLET | Freq: Every day | ORAL | 3 refills | Status: DC

## 2020-11-02 NOTE — Patient Instructions (Signed)
Labs done today. We will contact you only if your labs are abnormal.  START Spironolactone 12.22m (1/2 tablet) by mouth daily.   No other medication changes were made. Please continue all current medications as prescribed.  Your physician recommends that you schedule a follow-up appointment in: 1 week for a alb only appointment and in 6 months for an appointment with Dr. BHaroldine Laws Please contact our office in December to schedule a January appointment.   If you have any questions or concerns before your next appointment please send uKoreaa message through [email or web address] call our office at [phone]    TO LEAVE A MESSAGE FOR THE NURSE SELECT OPTION 2, PLEASE LEAVE A MESSAGE INCLUDING: YOUR NAME DATE OF BIRTH CALL BACK NUMBER REASON FOR CALL**this is important as we prioritize the call backs  YOU WILL RECEIVE A CALL BACK THE SAME DAY AS LONG AS YOU CALL BEFORE 4:00 PM   Do the following things EVERYDAY: Weigh yourself in the morning before breakfast. Write it down and keep it in a log. Take your medicines as prescribed Eat low salt foods--Limit salt (sodium) to 2000 mg per day.  Stay as active as you can everyday Limit all fluids for the day to less than 2 liters   At the [facility] you and your health needs are our priority. As part of our continuing mission to provide you with exceptional heart care, we have created designated Provider Care Teams. These Care Teams include your primary Cardiologist (physician) and Advanced Practice Providers (APPs- Physician Assistants and Nurse Practitioners) who all work together to provide you with the care you need, when you need it.   You may see any of the following providers on your designated Care Team at your next follow up: Dr DGlori BickersDr DHaynes Kerns NP BLyda Jester PUtahLAudry Riles PharmD   Please be sure to bring in all your medications bottles to every appointment.

## 2020-11-02 NOTE — Progress Notes (Signed)
ADVANCED HF CLINIC NOTE  Referring Physician: Dr Gwenlyn Found  Primary Care: Primary Cardiologist: Dr Gwenlyn Found Pulmonary: Dr Melvyn Novas   HPI  [name] is an 80 year old with history of COPD, DM, PAD, LCEA 2015, R CEA 2014, GERD, hyperlipidemia, anxiety, HTN,chronic respiratory failure on 2 liters at home, lung cancer, S/P L upper lobectomy 2017, and chronic diastolic heart failure.   Admitted 06/16/18 with difficulty walking with ataxia. Code Stroke called. R precentral gyrus 5 mm ischemic stroke noted. Discharged on oxygen. Discharged on asa and plavix. She was later discharged to SNF for an extended stay.   Admitted to [facility] on 11/13/18  with increased shortness of breath and chest pain. Diuresed with IV lasix and transitioned to lasix 40 mg twice a day. Had RHC/LHC with mod pulmonary htn and elevated filling pressures.  S/p repair 06/19/19 with right carotid exposure with common carotid and innominate artery stenting as well as subclavian artery stenting via brachial approach by Dr. Carlis Abbott.  Here for routine f/u. Feeling pretty good. Wearing oxygen regularly. Gets around with walker. Can go from one room to the next and then has to rest. No CP. Edema improved. No orthopnea or PND. Taking torsemide 40 bid. Doesn't take extra.    PFTs 11/20 FEV1 1.25 (62%) FVC 2.03 (75%) DLCO 51%  PFTs 8/17 FEV1 1.77 (84%) FVC 2.49 (88%) DLCO 69%  RHC/LHC  11/20/2018  RA 11 PAP 79/39 (52)  PCWP 31 PVR 4.9  CO 4.3  CI 2.9  Mild to moderate, non-obstructive coronary artery disease. Severe pulmonary hypertension. Moderately to severely elevated left and right heart filling pressures. Mildly reduced Fick cardiac output/index. Severe right brachiocephalic and subclavian artery stenoses with combined gradient of ~50 mmHg and possible reversal of flow in the right vertebral artery (subclavian steal).  ECHO 11/14/18 EF 65%  Moderate Asymmetric hypertrophy septal wall. RV mild reduced. Small pericardial effusion.     Past Medical History:  Diagnosis Date   Anxiety    Arthritis    Cancer ([place])    lung cancer   Carotid artery disease (HCC)    Cataracts, bilateral    CHF (congestive heart failure) (HCC)    Claudication (HCC)    COPD (chronic obstructive pulmonary disease) ([place])    Diabetes mellitus    Dizzy    Family history of adverse reaction to anesthesia    Pt nephew has PONV   GERD (gastroesophageal reflux disease)    Headache    History of kidney stones    Hypercholesteremia    Hypertension    Peripheral arterial disease (HCC)    bilateral iliac artery stenosis by angiography   Pneumonia    Stomach ulcer    Stroke ([place])    Tobacco abuse    Wears glasses     Current Outpatient Medications  Medication Sig Dispense Refill   albuterol (PROVENTIL HFA;VENTOLIN HFA) 108 (90 Base) MCG/ACT inhaler Inhale 2 puffs into the lungs every 6 (six) hours as needed for wheezing or shortness of breath.      albuterol (PROVENTIL) (2.5 MG/3ML) 0.083% nebulizer solution Take 2.5 mg by nebulization in the morning and at bedtime.      Ascorbic Acid (VITAMIN C) 1000 MG tablet Take 1,000 mg by mouth daily.     Biotin 1000 MCG tablet Take 1,000 mcg by mouth daily.     Calcium Carb-Cholecalciferol (CALCIUM CARBONATE-VITAMIN D3 PO) Take 1 tablet by mouth daily in the afternoon.     clopidogrel (PLAVIX) 75 MG  tablet Take 1 tablet (75 mg total) by mouth daily.     diazepam (VALIUM) 5 MG tablet Take one pill per day as needed for anxiety - use sparingly     dicyclomine (BENTYL) 20 MG tablet Take 20 mg by mouth as needed for spasms.     FLUoxetine (PROZAC) 10 MG capsule Take one pill per day     gabapentin (NEURONTIN) 100 MG capsule Take 1 capsule (100 mg total) by mouth 3 (three) times daily. 90 capsule 0   HYDROcodone-acetaminophen (NORCO/VICODIN) 5-325 MG tablet Take 1 tablet by mouth every 6 (six) hours as needed for moderate pain. 15 tablet 0   Magnesium 250 MG TABS Take 250 mg by mouth daily.      metFORMIN (GLUCOPHAGE) 500 MG tablet Take by mouth 2 (two) times daily with a meal.     metolazone (ZAROXOLYN) 5 MG tablet Take only as directed by the CHF clinic 10 tablet 0   metoprolol tartrate (LOPRESSOR) 50 MG tablet Take 50 mg by mouth 2 (two) times daily.     Omega-3 Fatty Acids (FISH OIL) 1200 MG CAPS Take 1,200 mg by mouth daily.     ondansetron (ZOFRAN) 4 MG tablet Take 4 mg by mouth at bedtime as needed for nausea or vomiting.      ONE TOUCH ULTRA TEST test strip      OXYGEN Inhale into the lungs. 2 liters n/c     pantoprazole (PROTONIX) 40 MG tablet Take 40 mg by mouth 2 (two) times daily.      polyethylene glycol (MIRALAX / GLYCOLAX) 17 g packet Take 17 g by mouth at bedtime.      potassium chloride (KLOR-CON) 10 MEQ tablet Take 2 tablets (20 mEq total) by mouth daily. 180 tablet 3   pravastatin (PRAVACHOL) 40 MG tablet Take 20 mg by mouth daily before supper.      torsemide (DEMADEX) 20 MG tablet Take 2 tablets (40 mg total) by mouth twice a day 360 tablet 2   traZODone (DESYREL) 50 MG tablet Take 50 mg by mouth at bedtime.     No current facility-administered medications for this encounter.    Allergies  Allergen Reactions   Fentanyl Shortness Of Breath      Social History   Socioeconomic History   Marital status: Widowed    Spouse name: Not on file   Number of children: Not on file   Years of education: Not on file   Highest education level: Not on file  Occupational History   Not on file  Tobacco Use   Smoking status: Former    Packs/day: 1.50    Years: 56.00    Pack years: 84.00    Types: Cigarettes    Quit date: 07/07/2013    Years since quitting: 7.3   Smokeless tobacco: Former    Quit date: 02/25/2013  Vaping Use   Vaping Use: Some days  Substance and Sexual Activity   Alcohol use: No   Drug use: No   Sexual activity: Never  Other Topics Concern   Not on file  Social History Narrative   Not on file   Social Determinants of Health   Financial  Resource Strain: Not on file  Food Insecurity: Not on file  Transportation Needs: Not on file  Physical Activity: Not on file  Stress: Not on file  Social Connections: Not on file  Intimate Partner Violence: Not on file      Family History  Problem Relation Age  of Onset   Stroke Mother    Hypertension Mother    Kidney failure Father     Vitals:   11/02/20 1600  BP: 104/60  Pulse: 68  SpO2: 94%  Weight: 77.2 kg (170 lb 3.2 oz)   Wt Readings from Last 3 Encounters:  11/02/20 77.2 kg (170 lb 3.2 oz)  01/12/20 76.7 kg (169 lb)  12/08/19 78.9 kg (174 lb)    PHYSICAL EXAM: General:  Elderly woman sitting in WC on O2. SOB with talking  HEENT: normal Neck: supple. Jvp 10 Carotids 2+ bilat; no bruits. No lymphadenopathy or thryomegaly appreciated. Cor: PMI nondisplaced. Regular rate & rhythm. No rubs, gallops or murmurs. Lungs: markedly decreased throughout  Abdomen: soft, nontender, nondistended. No hepatosplenomegaly. No bruits or masses. Good bowel sounds. Extremities: no cyanosis, clubbing, rash, 1+ edema L > R Neuro: alert & orientedx3, cranial nerves grossly intact. moves all 4 extremities w/o difficulty. Affect pleasant   ASSESSMENT & PLAN:  1. Pulmonary HTN, Mixed presentation.  - she has mixed PAH/PVH. RHC (8/20)  RA 11,PAP 79/39 (52) ,PCWP 31, PVR 4.8, CO 4.3 , CI 2.9 - suspect combination of WHO Group II and III  - PFTs with obstructive/restrictive picture -  Refused sleep study - Refuses Pulmonary Rehab due to distance to travel - Not candidate for selective pulmonary artery vasodilators - Continue wearing O2.   2. Chronic diastolic HF - ECHO 3/35/45 EF 65% small pericardial effusion.  - Chronic NYHA III-IIIB - Mild volume overload on torsemide 40 bid. Add spiro 12.5 daily  - Labs today and next week   3. Chronic Hypoxic Respiratory Failure due to COPD and H/O Lung Cancer S/P LUL lobectomy  - On home oxygen. Continue - F/u with Dr. Melvyn Novas  4. Suspected  Sleep Apnea  -  She has refused sleep study  5. Chronic dizziness - due to previous CVA +/- posterior circulation insufficiency - now improved  6. Severe PAD  - CT angiogram revealed near total occlusion of the innominate artery at the origin with extensive calcification of the arch. There is then is an area above the origin with mild narrowing of the innominate right at its bifurcation. There is a high-grade stenosis of the right common carotid and apparently mild stenosis of the right subclavian takeoff - s/p repair 06/19/19 with right carotid exposure with common carotid and innominate artery stenting as well as subclavian artery stenting via brachial approach by Dr. Carlis Abbott.  - stable currently   Glori Bickers MD 4:16 PM

## 2020-11-09 ENCOUNTER — Other Ambulatory Visit: Payer: Self-pay

## 2020-11-09 ENCOUNTER — Ambulatory Visit (HOSPITAL_COMMUNITY)
Admission: RE | Admit: 2020-11-09 | Discharge: 2020-11-09 | Disposition: A | Discharge status: Home / Self Care | Payer: PPO | Source: Ambulatory Visit | Attending: Cardiology | Admitting: Cardiology

## 2020-11-09 DIAGNOSIS — I5042 Chronic combined systolic (congestive) and diastolic (congestive) heart failure: Secondary | ICD-10-CM | POA: Diagnosis not present

## 2020-11-09 LAB — COMPREHENSIVE METABOLIC PANEL
ALT: 20 U/L (ref 0–44)
AST: 27 U/L (ref 15–41)
Albumin: 3.9 g/dL (ref 3.5–5.0)
Alkaline Phosphatase: 63 U/L (ref 38–126)
Anion gap: 9 (ref 5–15)
BUN: 27 mg/dL — ABNORMAL HIGH (ref 8–23)
CO2: 30 mmol/L (ref 22–32)
Calcium: 9.5 mg/dL (ref 8.9–10.3)
Chloride: 95 mmol/L — ABNORMAL LOW (ref 98–111)
Creatinine, Ser: 1.64 mg/dL — ABNORMAL HIGH (ref 0.44–1.00)
GFR, Estimated: 31 mL/min — ABNORMAL LOW (ref >60)
Glucose, Bld: 156 mg/dL — ABNORMAL HIGH (ref 70–99)
Potassium: 4.6 mmol/L (ref 3.5–5.1)
Sodium: 134 mmol/L — ABNORMAL LOW (ref 135–145)
Total Bilirubin: 0.4 mg/dL (ref 0.3–1.2)
Total Protein: 6.9 g/dL (ref 6.5–8.1)

## 2020-11-09 LAB — BRAIN NATRIURETIC PEPTIDE: B Natriuretic Peptide: 73.7 pg/mL (ref 0.0–100.0)

## 2020-11-27 DIAGNOSIS — I69398 Other sequelae of cerebral infarction: Secondary | ICD-10-CM | POA: Diagnosis not present

## 2020-11-27 DIAGNOSIS — I272 Pulmonary hypertension, unspecified: Secondary | ICD-10-CM | POA: Diagnosis not present

## 2020-11-27 DIAGNOSIS — Z85118 Personal history of other malignant neoplasm of bronchus and lung: Secondary | ICD-10-CM | POA: Diagnosis not present

## 2020-11-27 DIAGNOSIS — J449 Chronic obstructive pulmonary disease, unspecified: Secondary | ICD-10-CM | POA: Diagnosis not present

## 2020-12-02 ENCOUNTER — Telehealth (HOSPITAL_COMMUNITY): Payer: Self-pay | Admitting: *Deleted

## 2020-12-02 NOTE — Telephone Encounter (Signed)
Entered in error

## 2020-12-07 ENCOUNTER — Telehealth (HOSPITAL_COMMUNITY): Payer: Self-pay | Admitting: *Deleted

## 2020-12-07 ENCOUNTER — Other Ambulatory Visit (HOSPITAL_COMMUNITY): Payer: Self-pay | Admitting: *Deleted

## 2020-12-07 DIAGNOSIS — I5042 Chronic combined systolic (congestive) and diastolic (congestive) heart failure: Secondary | ICD-10-CM

## 2020-12-07 NOTE — Telephone Encounter (Signed)
[facility] referral faxed to [facility] [phone]

## 2020-12-08 ENCOUNTER — Telehealth (HOSPITAL_COMMUNITY): Payer: Self-pay | Admitting: Cardiology

## 2020-12-08 NOTE — Telephone Encounter (Signed)
Caitlin Oliver with [facility] called to report they are unable to accept this patient at this time, staffing is not available for that zip code.   Will iron out nature of referral for home health and forward to another provider/

## 2020-12-14 NOTE — Telephone Encounter (Signed)
Referral faxed to [facility].

## 2020-12-28 DIAGNOSIS — I69398 Other sequelae of cerebral infarction: Secondary | ICD-10-CM | POA: Diagnosis not present

## 2020-12-28 DIAGNOSIS — Z85118 Personal history of other malignant neoplasm of bronchus and lung: Secondary | ICD-10-CM | POA: Diagnosis not present

## 2020-12-28 DIAGNOSIS — I272 Pulmonary hypertension, unspecified: Secondary | ICD-10-CM | POA: Diagnosis not present

## 2020-12-28 DIAGNOSIS — J449 Chronic obstructive pulmonary disease, unspecified: Secondary | ICD-10-CM | POA: Diagnosis not present

## 2020-12-29 ENCOUNTER — Emergency Department (HOSPITAL_COMMUNITY): Payer: PPO

## 2020-12-29 ENCOUNTER — Other Ambulatory Visit: Payer: Self-pay

## 2020-12-29 ENCOUNTER — Emergency Department (HOSPITAL_COMMUNITY)
Admission: EM | Admit: 2020-12-29 | Discharge: 2020-12-30 | Disposition: A | Discharge status: Home / Self Care | Payer: PPO | Attending: Emergency Medicine | Admitting: Emergency Medicine

## 2020-12-29 ENCOUNTER — Encounter (HOSPITAL_COMMUNITY): Payer: Self-pay | Admitting: Emergency Medicine

## 2020-12-29 DIAGNOSIS — Z7984 Long term (current) use of oral hypoglycemic drugs: Secondary | ICD-10-CM | POA: Diagnosis not present

## 2020-12-29 DIAGNOSIS — Z87891 Personal history of nicotine dependence: Secondary | ICD-10-CM | POA: Diagnosis not present

## 2020-12-29 DIAGNOSIS — I110 Hypertensive heart disease with heart failure: Secondary | ICD-10-CM | POA: Insufficient documentation

## 2020-12-29 DIAGNOSIS — R531 Weakness: Secondary | ICD-10-CM | POA: Diagnosis not present

## 2020-12-29 DIAGNOSIS — Z79899 Other long term (current) drug therapy: Secondary | ICD-10-CM | POA: Diagnosis not present

## 2020-12-29 DIAGNOSIS — I5043 Acute on chronic combined systolic (congestive) and diastolic (congestive) heart failure: Secondary | ICD-10-CM | POA: Insufficient documentation

## 2020-12-29 DIAGNOSIS — E119 Type 2 diabetes mellitus without complications: Secondary | ICD-10-CM | POA: Insufficient documentation

## 2020-12-29 DIAGNOSIS — I10 Essential (primary) hypertension: Secondary | ICD-10-CM | POA: Diagnosis not present

## 2020-12-29 DIAGNOSIS — N390 Urinary tract infection, site not specified: Secondary | ICD-10-CM | POA: Diagnosis not present

## 2020-12-29 DIAGNOSIS — R4182 Altered mental status, unspecified: Secondary | ICD-10-CM | POA: Diagnosis not present

## 2020-12-29 DIAGNOSIS — S0990XA Unspecified injury of head, initial encounter: Secondary | ICD-10-CM | POA: Diagnosis not present

## 2020-12-29 DIAGNOSIS — S199XXA Unspecified injury of neck, initial encounter: Secondary | ICD-10-CM | POA: Diagnosis not present

## 2020-12-29 DIAGNOSIS — J439 Emphysema, unspecified: Secondary | ICD-10-CM | POA: Diagnosis not present

## 2020-12-29 DIAGNOSIS — R197 Diarrhea, unspecified: Secondary | ICD-10-CM | POA: Insufficient documentation

## 2020-12-29 DIAGNOSIS — G319 Degenerative disease of nervous system, unspecified: Secondary | ICD-10-CM | POA: Diagnosis not present

## 2020-12-29 DIAGNOSIS — Z20822 Contact with and (suspected) exposure to covid-19: Secondary | ICD-10-CM | POA: Insufficient documentation

## 2020-12-29 DIAGNOSIS — R5383 Other fatigue: Secondary | ICD-10-CM | POA: Diagnosis not present

## 2020-12-29 DIAGNOSIS — Z85118 Personal history of other malignant neoplasm of bronchus and lung: Secondary | ICD-10-CM | POA: Insufficient documentation

## 2020-12-29 DIAGNOSIS — J449 Chronic obstructive pulmonary disease, unspecified: Secondary | ICD-10-CM | POA: Insufficient documentation

## 2020-12-29 DIAGNOSIS — E042 Nontoxic multinodular goiter: Secondary | ICD-10-CM | POA: Diagnosis not present

## 2020-12-29 DIAGNOSIS — I6529 Occlusion and stenosis of unspecified carotid artery: Secondary | ICD-10-CM | POA: Diagnosis not present

## 2020-12-29 DIAGNOSIS — M4312 Spondylolisthesis, cervical region: Secondary | ICD-10-CM | POA: Diagnosis not present

## 2020-12-29 DIAGNOSIS — Z7902 Long term (current) use of antithrombotics/antiplatelets: Secondary | ICD-10-CM | POA: Diagnosis not present

## 2020-12-29 DIAGNOSIS — R296 Repeated falls: Secondary | ICD-10-CM | POA: Insufficient documentation

## 2020-12-29 DIAGNOSIS — R300 Dysuria: Secondary | ICD-10-CM | POA: Diagnosis present

## 2020-12-29 DIAGNOSIS — R7989 Other specified abnormal findings of blood chemistry: Secondary | ICD-10-CM | POA: Diagnosis not present

## 2020-12-29 DIAGNOSIS — M549 Dorsalgia, unspecified: Secondary | ICD-10-CM | POA: Diagnosis not present

## 2020-12-29 LAB — COMPREHENSIVE METABOLIC PANEL
ALT: 22 U/L (ref 0–44)
AST: 27 U/L (ref 15–41)
Albumin: 4 g/dL (ref 3.5–5.0)
Alkaline Phosphatase: 59 U/L (ref 38–126)
Anion gap: 10 (ref 5–15)
BUN: 27 mg/dL — ABNORMAL HIGH (ref 8–23)
CO2: 34 mmol/L — ABNORMAL HIGH (ref 22–32)
Calcium: 9.6 mg/dL (ref 8.9–10.3)
Chloride: 91 mmol/L — ABNORMAL LOW (ref 98–111)
Creatinine, Ser: 1.96 mg/dL — ABNORMAL HIGH (ref 0.44–1.00)
GFR, Estimated: 25 mL/min — ABNORMAL LOW (ref >60)
Glucose, Bld: 131 mg/dL — ABNORMAL HIGH (ref 70–99)
Potassium: 4.6 mmol/L (ref 3.5–5.1)
Sodium: 135 mmol/L (ref 135–145)
Total Bilirubin: 0.6 mg/dL (ref 0.3–1.2)
Total Protein: 6.8 g/dL (ref 6.5–8.1)

## 2020-12-29 LAB — I-STAT VENOUS BLOOD GAS, ED
Acid-Base Excess: 11 mmol/L — ABNORMAL HIGH (ref 0.0–2.0)
Bicarbonate: 38.4 mmol/L — ABNORMAL HIGH (ref 20.0–28.0)
Calcium, Ion: 1.07 mmol/L — ABNORMAL LOW (ref 1.15–1.40)
HCT: 37 % (ref 36.0–46.0)
Hemoglobin: 12.6 g/dL (ref 12.0–15.0)
O2 Saturation: 99 %
Potassium: 4.5 mmol/L (ref 3.5–5.1)
Sodium: 133 mmol/L — ABNORMAL LOW (ref 135–145)
TCO2: 40 mmol/L — ABNORMAL HIGH (ref 22–32)
pCO2, Ven: 60.5 mmHg — ABNORMAL HIGH (ref 44.0–60.0)
pH, Ven: 7.411 (ref 7.250–7.430)
pO2, Ven: 121 mmHg — ABNORMAL HIGH (ref 32.0–45.0)

## 2020-12-29 LAB — CBC WITH DIFFERENTIAL/PLATELET
Abs Immature Granulocytes: 0.04 10*3/uL (ref 0.00–0.07)
Basophils Absolute: 0.1 10*3/uL (ref 0.0–0.1)
Basophils Relative: 1 %
Eosinophils Absolute: 0.3 10*3/uL (ref 0.0–0.5)
Eosinophils Relative: 3 %
HCT: 37.2 % (ref 36.0–46.0)
Hemoglobin: 12.3 g/dL (ref 12.0–15.0)
Immature Granulocytes: 0 %
Lymphocytes Relative: 11 %
Lymphs Abs: 1.2 10*3/uL (ref 0.7–4.0)
MCH: 30.4 pg (ref 26.0–34.0)
MCHC: 33.1 g/dL (ref 30.0–36.0)
MCV: 92.1 fL (ref 80.0–100.0)
Monocytes Absolute: 0.7 10*3/uL (ref 0.1–1.0)
Monocytes Relative: 7 %
Neutro Abs: 8 10*3/uL — ABNORMAL HIGH (ref 1.7–7.7)
Neutrophils Relative %: 78 %
Platelets: 262 10*3/uL (ref 150–400)
RBC: 4.04 MIL/uL (ref 3.87–5.11)
RDW: 11.7 % (ref 11.5–15.5)
WBC: 10.3 10*3/uL (ref 4.0–10.5)
nRBC: 0 % (ref 0.0–0.2)

## 2020-12-29 LAB — AMMONIA: Ammonia: 11 umol/L (ref 9–35)

## 2020-12-29 LAB — RESP PANEL BY RT-PCR (FLU A&B, COVID) ARPGX2
Influenza A by PCR: NEGATIVE
Influenza B by PCR: NEGATIVE
SARS Coronavirus 2 by RT PCR: NEGATIVE

## 2020-12-29 LAB — CK: Total CK: 72 U/L (ref 38–234)

## 2020-12-29 MED ORDER — LACTATED RINGERS IV BOLUS
1000.0000 mL | Freq: Once | INTRAVENOUS | Status: AC
  Administered 2020-12-29: 1000 mL via INTRAVENOUS

## 2020-12-29 NOTE — ED Provider Notes (Signed)
[facility] EMERGENCY DEPARTMENT Provider Note   CSN: [id number] Arrival date & time: 12/29/20  1824     History Chief Complaint  Patient presents with   Altered Mental Status    Caitlin Oliver is a 80 y.o. female with PMHx HTN, HLD, CHF, COPD, T2DM, PAD who presents for evaluation of generalized weakness and frequent falls.   Patient reports approximately 4 day history of generalized weakness and fatigue.  She states that she has had multiple falls over this time, typically mechanical.  She states that she had a fall on Monday, as well as today.  She states that has been having a hard time making positional changes.  Over this time, she states that she has maintained normal appetite without nausea, vomiting, diarrhea, or abdominal pain.  She denies experiencing any recent fevers, chills.  She has reported intermittent dysuria to her daughter over this time.  No chest pain or shortness of breath.  No numbness, weakness, vision changes, coordination changes, or vertigo.   Past Medical History:  Diagnosis Date   Anxiety    Arthritis    Cancer ([place])    lung cancer   Carotid artery disease ([place])    Cataracts, bilateral    CHF (congestive heart failure) ([place])    Claudication (HCC)    COPD (chronic obstructive pulmonary disease) ([place])    Diabetes mellitus    Dizzy    Family history of adverse reaction to anesthesia    Pt nephew has PONV   GERD (gastroesophageal reflux disease)    Headache    History of kidney stones    Hypercholesteremia    Hypertension    Peripheral arterial disease ([place])    bilateral iliac artery stenosis by angiography   Pneumonia    Stomach ulcer    Stroke ([place])    Tobacco abuse    Wears glasses     Patient Active Problem List   Diagnosis Date Noted   Abdominal pain 12/08/2019   Acute on chronic heart failure with preserved ejection fraction (HFpEF) (HCC)    Unstable angina (HCC)    Acute on chronic combined systolic and diastolic  CHF (congestive heart failure) ([place]) 11/13/2018   Acute CVA (cerebrovascular accident) ([place]) 06/17/2018   Syncope 06/16/2018   Chest pain 06/16/2018   Pain 09/23/2017   Cervicalgia 07/16/2017   Pain in both upper extremities 06/03/2017   Pain in both lower extremities 06/03/2017   Bilateral carotid artery stenosis 06/03/2017   Benign paroxysmal positional vertigo 10/23/2016   Headache 06/19/2016   Cancer of left lung ([place]) 02/03/2016   COPD GOLD II 12/08/2015   Solitary pulmonary nodule 12/08/2015   Bilateral occipital neuralgia 11/22/2015   Double vision 10/28/2015   TIA (transient ischemic attack) 10/28/2015   Subclavian artery stenosis, left (HCC) 10/28/2015   Subclavian steal syndrome 10/28/2015   Acute blood loss anemia 10/20/2013   C. difficile colitis 10/19/2013   GI bleed 10/18/2013   COPD 10/18/2013   Bleeding gastrointestinal 10/18/2013   Hypoxia 10/10/2013   Bronchitis 10/08/2013   PVD (peripheral vascular disease) with claudication ([place]) 07/29/2013   Carotid stenosis 05/07/2013   Stenosis of left carotid artery 05/07/2013   Occlusion and stenosis of carotid artery without mention of cerebral infarction 03/02/2013   Carotid artery obstruction 03/02/2013   Carotid artery disease ([place]) 02/27/2013   Tobacco abuse 02/27/2013   Compulsive tobacco user syndrome 02/27/2013   Claudication ([place]) 01/20/2013   Essential hypertension 01/20/2013   Type 2 diabetes  mellitus ([place]) 01/20/2013   Hyperlipidemia 01/20/2013    Past Surgical History:  Procedure Laterality Date   ABDOMINAL AORTIC ENDOVASCULAR STENT GRAFT Right 06/19/2019   Procedure: INNOMINATE STENT, RIGHT CAROTID STENT, RIGHT SUBCLAVIAN STENT AND CAROTID CUTDOWN;  Surgeon: Marty Heck, MD;  Location: [facility];  Service: Vascular;  Laterality: Right;   ABDOMINAL HYSTERECTOMY     APPENDECTOMY     BREAST REDUCTION SURGERY     CAROTID ANGIOGRAM N/A 02/25/2013   Procedure: CAROTID ANGIOGRAM;  Surgeon: Lorretta Harp, MD;  Location: [facility] CATH LAB;  Service: Cardiovascular;  Laterality: N/A;   ENDARTERECTOMY Right 03/06/2013   Procedure: ENDARTERECTOMY CAROTID-RIGHT;  Surgeon: Serafina Mitchell, MD;  Location: [facility];  Service: Vascular;  Laterality: Right;   ENDARTERECTOMY Left 05/07/2013   Procedure: LEFT CAROTID ARTERY ENDARTERECTOMY WITH VASCU-GUARD PATCH ANGIOPLASTY ;  Surgeon: Serafina Mitchell, MD;  Location: [facility];  Service: Vascular;  Laterality: Left;   INTRAOPERATIVE ARTERIOGRAM N/A 06/19/2019   Procedure: Clydie Braun;  Surgeon: Marty Heck, MD;  Location: [facility];  Service: Vascular;  Laterality: N/A;   IR ANGIO INTRA EXTRACRAN SEL COM CAROTID INNOMINATE UNI R MOD SED  05/14/2019   IR ANGIO VERTEBRAL SEL SUBCLAVIAN INNOMINATE BILAT MOD SED  05/14/2019   LOBECTOMY Left 02/03/2016   Procedure: LEFT UPPER LOBECTOMY;  Surgeon: [name], MD;  Location: [facility];  Service: Thoracic;  Laterality: Left;   LOWER EXTREMITY ANGIOGRAM N/A 02/25/2013   Procedure: LOWER EXTREMITY ANGIOGRAM;  Surgeon: Lorretta Harp, MD;  Location: [facility] CATH LAB;  Service: Cardiovascular;  Laterality: N/A;   LOWER EXTREMITY ANGIOGRAM N/A 07/27/2013   Procedure: LOWER EXTREMITY ANGIOGRAM;  Surgeon: Lorretta Harp, MD;  Location: [facility] CATH LAB;  Service: Cardiovascular;  Laterality: N/A;   PATCH ANGIOPLASTY Right 03/06/2013   Procedure: PATCH ANGIOPLASTY of Right Carotid Artery using Vascu-Guard Patch;  Surgeon: Serafina Mitchell, MD;  Location: [facility];  Service: Vascular;  Laterality: Right;   PERIPHERAL VASCULAR CATHETERIZATION N/A 11/15/2015   Procedure: Aortic Arch Angiography;  Surgeon: Serafina Mitchell, MD;  Location: [facility] CV LAB;  Service: Cardiovascular;  Laterality: N/A;   PERIPHERAL VASCULAR CATHETERIZATION Bilateral 11/15/2015   Procedure: Carotid Angiography;  Surgeon: Serafina Mitchell, MD;  Location: [place] [facility] CV LAB;  Service: Cardiovascular;  Laterality: Bilateral;   PERIPHERAL VASCULAR CATHETERIZATION  Left 11/15/2015   Procedure: Upper Extremity Angiography;  Surgeon: Serafina Mitchell, MD;  Location: [facility] CV LAB;  Service: Cardiovascular;  Laterality: Left;   PERIPHERAL VASCULAR CATHETERIZATION Left 11/15/2015   Procedure: Peripheral Vascular Intervention;  Surgeon: Serafina Mitchell, MD;  Location: [facility] CV LAB;  Service: Cardiovascular;  Laterality: Left;  subclavian    RIGHT HEART CATH N/A 06/16/2019   Procedure: RIGHT HEART CATH;  Surgeon: Jolaine Artist, MD;  Location: [facility] CV LAB;  Service: Cardiovascular;  Laterality: N/A;   RIGHT/LEFT HEART CATH AND CORONARY ANGIOGRAPHY N/A 11/20/2018   Procedure: RIGHT/LEFT HEART CATH AND CORONARY ANGIOGRAPHY;  Surgeon: Nelva Bush, MD;  Location: [facility] CV LAB;  Service: Cardiovascular;  Laterality: N/A;   SALIVARY GLAND SURGERY     scar tissue removed from left saliva glad   TUBAL LIGATION     ULTRASOUND GUIDANCE FOR VASCULAR ACCESS Right 06/19/2019   Procedure: Ultrasound Guidance For Vascular Access;  Surgeon: Marty Heck, MD;  Location: [facility];  Service: Vascular;  Laterality: Right;   VIDEO ASSISTED THORACOSCOPY (VATS)/WEDGE RESECTION Left 02/03/2016   Procedure:  VIDEO ASSISTED THORACOSCOPY;  Surgeon: [name], MD;  Location: [facility];  Service: Thoracic;  Laterality: Left;     OB History   No obstetric history on file.     Family History  Problem Relation Age of Onset   Stroke Mother    Hypertension Mother    Kidney failure Father     Social History   Tobacco Use   Smoking status: Former    Packs/day: 1.50    Years: 56.00    Pack years: 84.00    Types: Cigarettes    Quit date: 07/07/2013    Years since quitting: 7.4   Smokeless tobacco: Former    Quit date: 02/25/2013  Vaping Use   Vaping Use: Some days  Substance Use Topics   Alcohol use: No   Drug use: No    Home Medications Prior to Admission medications   Medication Sig Start Date End Date Taking? Authorizing Provider   cephALEXin (KEFLEX) 500 MG capsule Take 1 capsule (500 mg total) by mouth 2 (two) times daily for 10 days. 12/30/20 01/09/21 Yes Violet Baldy, MD  albuterol (PROVENTIL HFA;VENTOLIN HFA) 108 (90 Base) MCG/ACT inhaler Inhale 2 puffs into the lungs every 6 (six) hours as needed for wheezing or shortness of breath.  07/11/17   [provider]  albuterol (PROVENTIL) (2.5 MG/3ML) 0.083% nebulizer solution Take 2.5 mg by nebulization in the morning and at bedtime.     [provider]  Ascorbic Acid (VITAMIN C) 1000 MG tablet Take 1,000 mg by mouth daily.    [provider]  Biotin 1000 MCG tablet Take 1,000 mcg by mouth daily.    [provider]  Calcium Carb-Cholecalciferol (CALCIUM CARBONATE-VITAMIN D3 PO) Take 1 tablet by mouth daily in the afternoon.    [provider]  clopidogrel (PLAVIX) 75 MG tablet Take 1 tablet (75 mg total) by mouth daily. 06/20/18   Thurnell Lose, MD  diazepam (VALIUM) 5 MG tablet Take one pill per day as needed for anxiety - use sparingly 09/08/19   [provider]  dicyclomine (BENTYL) 20 MG tablet Take 20 mg by mouth as needed for spasms.    [provider]  FLUoxetine (PROZAC) 10 MG capsule Take one pill per day 09/15/19   [provider]  gabapentin (NEURONTIN) 100 MG capsule Take 1 capsule (100 mg total) by mouth 3 (three) times daily. 11/24/18   Swayze, Ava, DO  HYDROcodone-acetaminophen (NORCO/VICODIN) 5-325 MG tablet Take 1 tablet by mouth every 6 (six) hours as needed for moderate pain. 06/21/19   Dagoberto Ligas, PA-C  Magnesium 250 MG TABS Take 250 mg by mouth daily.    [provider]  metFORMIN (GLUCOPHAGE) 500 MG tablet Take by mouth 2 (two) times daily with a meal.    [provider]  metolazone (ZAROXOLYN) 5 MG tablet Take only as directed by the CHF clinic 10/14/20   Bensimhon, Shaune Pascal, MD  metoprolol tartrate (LOPRESSOR) 50 MG tablet Take 50 mg by mouth 2 (two) times daily.     [provider]  Omega-3 Fatty Acids (FISH OIL) 1200 MG CAPS Take 1,200 mg by mouth daily.    [provider]  ondansetron (ZOFRAN) 4 MG tablet Take 4 mg by mouth at bedtime as needed for nausea or vomiting.  06/05/18   [provider]  ONE TOUCH ULTRA TEST test strip  09/12/16   [provider]  OXYGEN Inhale into the lungs. 2 liters n/c  [provider]  pantoprazole (PROTONIX) 40 MG tablet Take 40 mg by mouth 2 (two) times daily.  12/17/13   [provider]  polyethylene glycol (MIRALAX / GLYCOLAX) 17 g packet Take 17 g by mouth at bedtime.  12/02/18   [provider]  potassium chloride (KLOR-CON) 10 MEQ tablet Take 2 tablets (20 mEq total) by mouth daily. 09/02/20   Bensimhon, Shaune Pascal, MD  pravastatin (PRAVACHOL) 40 MG tablet Take 20 mg by mouth daily before supper.     [provider]  spironolactone (ALDACTONE) 25 MG tablet Take 0.5 tablets (12.5 mg total) by mouth daily. 11/02/20   Bensimhon, Shaune Pascal, MD  torsemide (DEMADEX) 20 MG tablet Take 2 tablets (40 mg total) by mouth twice a day 04/20/20   Bensimhon, Shaune Pascal, MD  traZODone (DESYREL) 50 MG tablet Take 50 mg by mouth at bedtime.    [provider]    Allergies    Fentanyl  Review of Systems   Review of Systems  Constitutional:  Positive for fatigue. Negative for chills and fever.  HENT:  Negative for ear pain and sore throat.   Eyes:  Negative for pain and visual disturbance.  Respiratory:  Negative for cough and shortness of breath.   Cardiovascular:  Negative for chest pain and palpitations.  Gastrointestinal:  Negative for abdominal pain and vomiting.  Genitourinary:  Negative for dysuria and hematuria.  Musculoskeletal:  Negative for arthralgias and back pain.  Skin:  Negative for color change and rash.  Neurological:  Negative for seizures and syncope.  All other systems reviewed and are negative.  Physical Exam Updated Vital Signs BP  112/80   Pulse 84   Temp 97.6 F (36.4 C) (Oral)   Resp 18   SpO2 99%   Physical Exam Vitals and nursing note reviewed.  Constitutional:      General: She is not in acute distress.    Appearance: She is well-developed.  HENT:     Head: Normocephalic and atraumatic.     Mouth/Throat:     Mouth: Mucous membranes are dry.  Eyes:     Conjunctiva/sclera: Conjunctivae normal.  Cardiovascular:     Rate and Rhythm: Normal rate and regular rhythm.     Heart sounds: No murmur heard. Pulmonary:     Effort: Pulmonary effort is normal. No respiratory distress.     Breath sounds: Normal breath sounds.  Chest:     Chest wall: No tenderness.  Abdominal:     General: There is no distension.     Palpations: Abdomen is soft.     Tenderness: There is no abdominal tenderness.  Musculoskeletal:     Cervical back: Normal range of motion and neck supple.  Skin:    General: Skin is warm and dry.     Capillary Refill: Capillary refill takes less than 2 seconds.     Comments: Decreased skin turgor.  Neurological:     Mental Status: She is alert.    ED Results / Procedures / Treatments   Labs (all labs ordered are listed, but only abnormal results are displayed) Labs Reviewed  CBC WITH DIFFERENTIAL/PLATELET - Abnormal; Notable for the following components:      Result Value   Neutro Abs 8.0 (*)    All other components within normal limits  COMPREHENSIVE METABOLIC PANEL - Abnormal; Notable for the following components:   Chloride 91 (*)    CO2 34 (*)    Glucose, Bld 131 (*)  BUN 27 (*)    Creatinine, Ser 1.96 (*)    GFR, Estimated 25 (*)    All other components within normal limits  URINALYSIS, COMPLETE (UACMP) WITH MICROSCOPIC - Abnormal; Notable for the following components:   Color, Urine STRAW (*)    Leukocytes,Ua MODERATE (*)    Bacteria, UA RARE (*)    All other components within normal limits  I-STAT VENOUS BLOOD GAS, ED - Abnormal; Notable for the following components:    pCO2, Ven 60.5 (*)    pO2, Ven 121.0 (*)    Bicarbonate 38.4 (*)    TCO2 40 (*)    Acid-Base Excess 11.0 (*)    Sodium 133 (*)    Calcium, Ion 1.07 (*)    All other components within normal limits  RESP PANEL BY RT-PCR (FLU A&B, COVID) ARPGX2  CK  AMMONIA    EKG EKG Interpretation  Date/Time:  Thursday December 29 2020 19:33:01 EDT Ventricular Rate:  71 PR Interval:  165 QRS Duration: 78 QT Interval:  443 QTC Calculation: 482 R Axis:   77 Text Interpretation: Sinus rhythm normal axis no acute ischemia Confirmed by Lorre Munroe ([id number]) on 12/30/2020 12:24:29 AM  Radiology CT HEAD WO CONTRAST (5MM)  Result Date: 12/29/2020 CLINICAL DATA:  Head trauma EXAM: CT HEAD WITHOUT CONTRAST CT CERVICAL SPINE WITHOUT CONTRAST TECHNIQUE: Multidetector CT imaging of the head and cervical spine was performed following the standard protocol without intravenous contrast. Multiplanar CT image reconstructions of the cervical spine were also generated. COMPARISON:  CT 09/23/2018, CT neck 10/20/2015 FINDINGS: CT HEAD FINDINGS Brain: No acute territorial infarction, hemorrhage, or intracranial is visualized. Moderate atrophy. Extensive white matter hypodensity most likely chronic small vessel ischemic change. Stable ventricle size Vascular: No hyperdense vessels.  Carotid vascular calcification Skull: Normal. Negative for fracture or focal lesion. Sinuses/Orbits: No acute finding. Other: None CT CERVICAL SPINE FINDINGS Alignment: Straightening of the cervical spine. Trace anterolisthesis C4 on C5. Trace retrolisthesis C5 on C6. Facet alignment within normal limits Skull base and vertebrae: No acute fracture. No primary bone lesion or focal pathologic process. Soft tissues and spinal canal: No prevertebral fluid or swelling. No visible canal hematoma. Disc levels: Moderate disc space narrowing and degenerative change C5-C6. Fusion of the facets at C3-C4. Upper chest: Emphysema. Heterogenous enlarged thyroid gland  with 3.1 cm dominant left lobe nodule. This has been evaluated on previous imaging. (ref: J Am Coll Radiol. 2015 Feb;12(2): 143-50). Other: Incompletely visualized vascular stents. IMPRESSION: 1. No CT evidence for acute intracranial abnormality. Atrophy and chronic small vessel ischemic changes of the white matter 2. Straightening of the cervical spine with stable trace anterolisthesis C4 on C5 and trace retrolisthesis C5 on C6. No acute osseous abnormality 3. Emphysema Electronically Signed   By: Donavan Foil M.D.   On: 12/29/2020 20:24   CT Cervical Spine Wo Contrast  Result Date: 12/29/2020 CLINICAL DATA:  Head trauma EXAM: CT HEAD WITHOUT CONTRAST CT CERVICAL SPINE WITHOUT CONTRAST TECHNIQUE: Multidetector CT imaging of the head and cervical spine was performed following the standard protocol without intravenous contrast. Multiplanar CT image reconstructions of the cervical spine were also generated. COMPARISON:  CT 09/23/2018, CT neck 10/20/2015 FINDINGS: CT HEAD FINDINGS Brain: No acute territorial infarction, hemorrhage, or intracranial is visualized. Moderate atrophy. Extensive white matter hypodensity most likely chronic small vessel ischemic change. Stable ventricle size Vascular: No hyperdense vessels.  Carotid vascular calcification Skull: Normal. Negative for fracture or focal lesion. Sinuses/Orbits: No acute finding. Other: None CT CERVICAL  SPINE FINDINGS Alignment: Straightening of the cervical spine. Trace anterolisthesis C4 on C5. Trace retrolisthesis C5 on C6. Facet alignment within normal limits Skull base and vertebrae: No acute fracture. No primary bone lesion or focal pathologic process. Soft tissues and spinal canal: No prevertebral fluid or swelling. No visible canal hematoma. Disc levels: Moderate disc space narrowing and degenerative change C5-C6. Fusion of the facets at C3-C4. Upper chest: Emphysema. Heterogenous enlarged thyroid gland with 3.1 cm dominant left lobe nodule. This has  been evaluated on previous imaging. (ref: J Am Coll Radiol. 2015 Feb;12(2): 143-50). Other: Incompletely visualized vascular stents. IMPRESSION: 1. No CT evidence for acute intracranial abnormality. Atrophy and chronic small vessel ischemic changes of the white matter 2. Straightening of the cervical spine with stable trace anterolisthesis C4 on C5 and trace retrolisthesis C5 on C6. No acute osseous abnormality 3. Emphysema Electronically Signed   By: Donavan Foil M.D.   On: 12/29/2020 20:24    Procedures Procedures   Medications Ordered in ED Medications  lactated ringers bolus 1,000 mL (0 mLs Intravenous Stopped 12/30/20 0208)  cefTRIAXone (ROCEPHIN) 1 g in sodium chloride 0.9 % 100 mL IVPB (0 g Intravenous Stopped 12/30/20 0207)    ED Course  I have reviewed the triage vital signs and the nursing notes.  Pertinent labs & imaging results that were available during my care of the patient were reviewed by me and considered in my medical decision making (see chart for details).    MDM Rules/Calculators/A&P                           80 y.o. female with past medical history as above who presents for evaluation of generalized weakness and frequent falls. Afebrile and hemodynamically stable.  Exam as detailed above.  No external signs of trauma.  Given reported history of fall while on blood thinners with head trauma, CT head and cervical spine were obtained and unremarkable.  No acute traumatic findings.  CBC with no leukocytosis but left shift with elevated absolute neutrophil count.  CMP with contraction alkalosis, as well as mildly elevated creatinine at 1.96 from baseline 1.64.  Urinalysis concerning for UTI with pyuria, bacteriuria, and leukocyte esterase.  Patient was treated with Rocephin and provided with course of Keflex.  Presentation is likely due to UTI precipitating generalized weakness and increased falls.  Patient is able to ambulate here.  Engaged in shared decision-making with the  patient and her daughter at bedside, who are amenable to the patient returning home at this time.  Final Clinical Impression(s) / ED Diagnoses Final diagnoses:  Urinary tract infection without hematuria, site unspecified    Rx / DC Orders ED Discharge Orders          Ordered    cephALEXin (KEFLEX) 500 MG capsule  2 times daily        12/30/20 0035             Violet Baldy, MD 12/30/20 1932    Carmin Muskrat, MD 01/02/21 0830

## 2020-12-29 NOTE — ED Triage Notes (Signed)
Pt from home via [facility]. Per family pt is confused and has had multiple falls over the last 2 weeks. Pt A&O x 4.

## 2020-12-29 NOTE — ED Notes (Signed)
This RN and Dyann Ruddle NT in and out cath pt. Pt with 536m+ of output. Urine culture sent down with specimen

## 2020-12-30 LAB — URINALYSIS, COMPLETE (UACMP) WITH MICROSCOPIC
Bilirubin Urine: NEGATIVE
Glucose, UA: NEGATIVE mg/dL
Hgb urine dipstick: NEGATIVE
Ketones, ur: NEGATIVE mg/dL
Nitrite: NEGATIVE
Protein, ur: NEGATIVE mg/dL
Specific Gravity, Urine: 1.005 (ref 1.005–1.030)
pH: 6 (ref 5.0–8.0)

## 2020-12-30 MED ORDER — CEPHALEXIN 500 MG PO CAPS: 500.0000 mg | ORAL_CAPSULE | Freq: Two times a day (BID) | ORAL | 0 refills | Status: AC

## 2020-12-30 MED ORDER — SODIUM CHLORIDE 0.9 % IV SOLN
1.0000 g | Freq: Once | INTRAVENOUS | Status: AC
  Administered 2020-12-30: 1 g via INTRAVENOUS
  Filled 2020-12-30: qty 10

## 2020-12-30 NOTE — ED Notes (Signed)
Patient verbalizes understanding of discharge instructions. Opportunity for questioning and answers were provided. Armband removed by staff, pt discharged from ED via wheelchair.  

## 2020-12-31 DIAGNOSIS — J449 Chronic obstructive pulmonary disease, unspecified: Secondary | ICD-10-CM | POA: Diagnosis not present

## 2020-12-31 DIAGNOSIS — K1120 Sialoadenitis, unspecified: Secondary | ICD-10-CM | POA: Diagnosis not present

## 2020-12-31 DIAGNOSIS — I110 Hypertensive heart disease with heart failure: Secondary | ICD-10-CM | POA: Diagnosis not present

## 2020-12-31 DIAGNOSIS — M5481 Occipital neuralgia: Secondary | ICD-10-CM | POA: Diagnosis not present

## 2020-12-31 DIAGNOSIS — Z9981 Dependence on supplemental oxygen: Secondary | ICD-10-CM | POA: Diagnosis not present

## 2020-12-31 DIAGNOSIS — K219 Gastro-esophageal reflux disease without esophagitis: Secondary | ICD-10-CM | POA: Diagnosis not present

## 2020-12-31 DIAGNOSIS — M5030 Other cervical disc degeneration, unspecified cervical region: Secondary | ICD-10-CM | POA: Diagnosis not present

## 2020-12-31 DIAGNOSIS — E1151 Type 2 diabetes mellitus with diabetic peripheral angiopathy without gangrene: Secondary | ICD-10-CM | POA: Diagnosis not present

## 2020-12-31 DIAGNOSIS — F32A Depression, unspecified: Secondary | ICD-10-CM | POA: Diagnosis not present

## 2020-12-31 DIAGNOSIS — H8110 Benign paroxysmal vertigo, unspecified ear: Secondary | ICD-10-CM | POA: Diagnosis not present

## 2020-12-31 DIAGNOSIS — F419 Anxiety disorder, unspecified: Secondary | ICD-10-CM | POA: Diagnosis not present

## 2020-12-31 DIAGNOSIS — D649 Anemia, unspecified: Secondary | ICD-10-CM | POA: Diagnosis not present

## 2020-12-31 DIAGNOSIS — H6123 Impacted cerumen, bilateral: Secondary | ICD-10-CM | POA: Diagnosis not present

## 2020-12-31 DIAGNOSIS — I6523 Occlusion and stenosis of bilateral carotid arteries: Secondary | ICD-10-CM | POA: Diagnosis not present

## 2020-12-31 DIAGNOSIS — I5042 Chronic combined systolic (congestive) and diastolic (congestive) heart failure: Secondary | ICD-10-CM | POA: Diagnosis not present

## 2020-12-31 DIAGNOSIS — H532 Diplopia: Secondary | ICD-10-CM | POA: Diagnosis not present

## 2020-12-31 DIAGNOSIS — G4700 Insomnia, unspecified: Secondary | ICD-10-CM | POA: Diagnosis not present

## 2020-12-31 DIAGNOSIS — I272 Pulmonary hypertension, unspecified: Secondary | ICD-10-CM | POA: Diagnosis not present

## 2020-12-31 DIAGNOSIS — E7800 Pure hypercholesterolemia, unspecified: Secondary | ICD-10-CM | POA: Diagnosis not present

## 2020-12-31 DIAGNOSIS — G43909 Migraine, unspecified, not intractable, without status migrainosus: Secondary | ICD-10-CM | POA: Diagnosis not present

## 2020-12-31 DIAGNOSIS — J9611 Chronic respiratory failure with hypoxia: Secondary | ICD-10-CM | POA: Diagnosis not present

## 2020-12-31 DIAGNOSIS — M1990 Unspecified osteoarthritis, unspecified site: Secondary | ICD-10-CM | POA: Diagnosis not present

## 2020-12-31 DIAGNOSIS — E1140 Type 2 diabetes mellitus with diabetic neuropathy, unspecified: Secondary | ICD-10-CM | POA: Diagnosis not present

## 2020-12-31 DIAGNOSIS — N390 Urinary tract infection, site not specified: Secondary | ICD-10-CM | POA: Diagnosis not present

## 2020-12-31 DIAGNOSIS — H9193 Unspecified hearing loss, bilateral: Secondary | ICD-10-CM | POA: Diagnosis not present

## 2021-01-02 ENCOUNTER — Other Ambulatory Visit (HOSPITAL_COMMUNITY): Payer: PPO

## 2021-01-06 ENCOUNTER — Other Ambulatory Visit (HOSPITAL_COMMUNITY): Payer: Self-pay

## 2021-01-06 MED ORDER — TORSEMIDE 20 MG PO TABS: ORAL_TABLET | ORAL | 0 refills | Status: DC

## 2021-01-09 DIAGNOSIS — Z23 Encounter for immunization: Secondary | ICD-10-CM | POA: Diagnosis not present

## 2021-01-09 DIAGNOSIS — N390 Urinary tract infection, site not specified: Secondary | ICD-10-CM | POA: Diagnosis not present

## 2021-01-09 DIAGNOSIS — R531 Weakness: Secondary | ICD-10-CM | POA: Diagnosis not present

## 2021-01-09 DIAGNOSIS — E119 Type 2 diabetes mellitus without complications: Secondary | ICD-10-CM | POA: Diagnosis not present

## 2021-01-09 DIAGNOSIS — N178 Other acute kidney failure: Secondary | ICD-10-CM | POA: Diagnosis not present

## 2021-01-09 DIAGNOSIS — I10 Essential (primary) hypertension: Secondary | ICD-10-CM | POA: Diagnosis not present

## 2021-01-16 DIAGNOSIS — I272 Pulmonary hypertension, unspecified: Secondary | ICD-10-CM | POA: Diagnosis not present

## 2021-01-16 DIAGNOSIS — E7800 Pure hypercholesterolemia, unspecified: Secondary | ICD-10-CM | POA: Diagnosis not present

## 2021-01-16 DIAGNOSIS — I5042 Chronic combined systolic (congestive) and diastolic (congestive) heart failure: Secondary | ICD-10-CM | POA: Diagnosis not present

## 2021-01-16 DIAGNOSIS — K219 Gastro-esophageal reflux disease without esophagitis: Secondary | ICD-10-CM | POA: Diagnosis not present

## 2021-01-16 DIAGNOSIS — M1990 Unspecified osteoarthritis, unspecified site: Secondary | ICD-10-CM | POA: Diagnosis not present

## 2021-01-16 DIAGNOSIS — G43909 Migraine, unspecified, not intractable, without status migrainosus: Secondary | ICD-10-CM | POA: Diagnosis not present

## 2021-01-16 DIAGNOSIS — G4700 Insomnia, unspecified: Secondary | ICD-10-CM | POA: Diagnosis not present

## 2021-01-16 DIAGNOSIS — J449 Chronic obstructive pulmonary disease, unspecified: Secondary | ICD-10-CM | POA: Diagnosis not present

## 2021-01-16 DIAGNOSIS — F32A Depression, unspecified: Secondary | ICD-10-CM | POA: Diagnosis not present

## 2021-01-16 DIAGNOSIS — E1140 Type 2 diabetes mellitus with diabetic neuropathy, unspecified: Secondary | ICD-10-CM | POA: Diagnosis not present

## 2021-01-16 DIAGNOSIS — M5481 Occipital neuralgia: Secondary | ICD-10-CM | POA: Diagnosis not present

## 2021-01-16 DIAGNOSIS — E1151 Type 2 diabetes mellitus with diabetic peripheral angiopathy without gangrene: Secondary | ICD-10-CM | POA: Diagnosis not present

## 2021-01-16 DIAGNOSIS — H6123 Impacted cerumen, bilateral: Secondary | ICD-10-CM | POA: Diagnosis not present

## 2021-01-16 DIAGNOSIS — H8110 Benign paroxysmal vertigo, unspecified ear: Secondary | ICD-10-CM | POA: Diagnosis not present

## 2021-01-16 DIAGNOSIS — F419 Anxiety disorder, unspecified: Secondary | ICD-10-CM | POA: Diagnosis not present

## 2021-01-16 DIAGNOSIS — H9193 Unspecified hearing loss, bilateral: Secondary | ICD-10-CM | POA: Diagnosis not present

## 2021-01-16 DIAGNOSIS — I6523 Occlusion and stenosis of bilateral carotid arteries: Secondary | ICD-10-CM | POA: Diagnosis not present

## 2021-01-16 DIAGNOSIS — I110 Hypertensive heart disease with heart failure: Secondary | ICD-10-CM | POA: Diagnosis not present

## 2021-01-16 DIAGNOSIS — Z9981 Dependence on supplemental oxygen: Secondary | ICD-10-CM | POA: Diagnosis not present

## 2021-01-16 DIAGNOSIS — M5030 Other cervical disc degeneration, unspecified cervical region: Secondary | ICD-10-CM | POA: Diagnosis not present

## 2021-01-16 DIAGNOSIS — H532 Diplopia: Secondary | ICD-10-CM | POA: Diagnosis not present

## 2021-01-16 DIAGNOSIS — J9611 Chronic respiratory failure with hypoxia: Secondary | ICD-10-CM | POA: Diagnosis not present

## 2021-01-16 DIAGNOSIS — D649 Anemia, unspecified: Secondary | ICD-10-CM | POA: Diagnosis not present

## 2021-01-16 DIAGNOSIS — K1120 Sialoadenitis, unspecified: Secondary | ICD-10-CM | POA: Diagnosis not present

## 2021-01-16 DIAGNOSIS — N390 Urinary tract infection, site not specified: Secondary | ICD-10-CM | POA: Diagnosis not present

## 2021-01-17 DIAGNOSIS — N178 Other acute kidney failure: Secondary | ICD-10-CM | POA: Diagnosis not present

## 2021-01-17 DIAGNOSIS — E875 Hyperkalemia: Secondary | ICD-10-CM | POA: Diagnosis not present

## 2021-01-27 DIAGNOSIS — I272 Pulmonary hypertension, unspecified: Secondary | ICD-10-CM | POA: Diagnosis not present

## 2021-01-27 DIAGNOSIS — J449 Chronic obstructive pulmonary disease, unspecified: Secondary | ICD-10-CM | POA: Diagnosis not present

## 2021-01-27 DIAGNOSIS — Z85118 Personal history of other malignant neoplasm of bronchus and lung: Secondary | ICD-10-CM | POA: Diagnosis not present

## 2021-01-27 DIAGNOSIS — I69398 Other sequelae of cerebral infarction: Secondary | ICD-10-CM | POA: Diagnosis not present

## 2021-02-02 DIAGNOSIS — N178 Other acute kidney failure: Secondary | ICD-10-CM | POA: Diagnosis not present

## 2021-02-16 DIAGNOSIS — N178 Other acute kidney failure: Secondary | ICD-10-CM | POA: Diagnosis not present

## 2021-02-17 ENCOUNTER — Telehealth (HOSPITAL_COMMUNITY): Payer: Self-pay | Admitting: *Deleted

## 2021-02-17 NOTE — Telephone Encounter (Signed)
Pt saw pcp today and they recommended she stop spironolactone ( see note below). Pt did not want to stop medication until Dr.Bensimhon told her to.  Routed to [facility] for advice   Telephone Encounter - Linwood Dibbles, DO - 02/17/2021 7:14 AM EDT Formatting of this note might be different from the original. Patient's BMP shows patient's kidney functions is decrease again (creatinine increased from 1.11 to 1.48). After reviewing patient's records, it appears that patient's kidney function started decreasing in July 2022 after she started spironolactone. Would recommend to stop the spironolactone and repeat kidney function in 2 weeks for reevaluation. Call for any questions/concerns.

## 2021-02-20 NOTE — Telephone Encounter (Signed)
Pt aware and agreeable with plan.

## 2021-02-23 DIAGNOSIS — I5042 Chronic combined systolic (congestive) and diastolic (congestive) heart failure: Secondary | ICD-10-CM | POA: Diagnosis not present

## 2021-02-23 DIAGNOSIS — K219 Gastro-esophageal reflux disease without esophagitis: Secondary | ICD-10-CM | POA: Diagnosis not present

## 2021-02-23 DIAGNOSIS — J449 Chronic obstructive pulmonary disease, unspecified: Secondary | ICD-10-CM | POA: Diagnosis not present

## 2021-02-23 DIAGNOSIS — E1140 Type 2 diabetes mellitus with diabetic neuropathy, unspecified: Secondary | ICD-10-CM | POA: Diagnosis not present

## 2021-02-23 DIAGNOSIS — Z9981 Dependence on supplemental oxygen: Secondary | ICD-10-CM | POA: Diagnosis not present

## 2021-02-23 DIAGNOSIS — M5481 Occipital neuralgia: Secondary | ICD-10-CM | POA: Diagnosis not present

## 2021-02-23 DIAGNOSIS — I272 Pulmonary hypertension, unspecified: Secondary | ICD-10-CM | POA: Diagnosis not present

## 2021-02-23 DIAGNOSIS — H9193 Unspecified hearing loss, bilateral: Secondary | ICD-10-CM | POA: Diagnosis not present

## 2021-02-23 DIAGNOSIS — G4700 Insomnia, unspecified: Secondary | ICD-10-CM | POA: Diagnosis not present

## 2021-02-23 DIAGNOSIS — E7800 Pure hypercholesterolemia, unspecified: Secondary | ICD-10-CM | POA: Diagnosis not present

## 2021-02-23 DIAGNOSIS — H532 Diplopia: Secondary | ICD-10-CM | POA: Diagnosis not present

## 2021-02-23 DIAGNOSIS — D649 Anemia, unspecified: Secondary | ICD-10-CM | POA: Diagnosis not present

## 2021-02-23 DIAGNOSIS — J9611 Chronic respiratory failure with hypoxia: Secondary | ICD-10-CM | POA: Diagnosis not present

## 2021-02-23 DIAGNOSIS — M1990 Unspecified osteoarthritis, unspecified site: Secondary | ICD-10-CM | POA: Diagnosis not present

## 2021-02-23 DIAGNOSIS — N390 Urinary tract infection, site not specified: Secondary | ICD-10-CM | POA: Diagnosis not present

## 2021-02-23 DIAGNOSIS — K1120 Sialoadenitis, unspecified: Secondary | ICD-10-CM | POA: Diagnosis not present

## 2021-02-23 DIAGNOSIS — G43909 Migraine, unspecified, not intractable, without status migrainosus: Secondary | ICD-10-CM | POA: Diagnosis not present

## 2021-02-23 DIAGNOSIS — M5030 Other cervical disc degeneration, unspecified cervical region: Secondary | ICD-10-CM | POA: Diagnosis not present

## 2021-02-23 DIAGNOSIS — H8110 Benign paroxysmal vertigo, unspecified ear: Secondary | ICD-10-CM | POA: Diagnosis not present

## 2021-02-23 DIAGNOSIS — F419 Anxiety disorder, unspecified: Secondary | ICD-10-CM | POA: Diagnosis not present

## 2021-02-23 DIAGNOSIS — I6523 Occlusion and stenosis of bilateral carotid arteries: Secondary | ICD-10-CM | POA: Diagnosis not present

## 2021-02-23 DIAGNOSIS — I110 Hypertensive heart disease with heart failure: Secondary | ICD-10-CM | POA: Diagnosis not present

## 2021-02-23 DIAGNOSIS — E1151 Type 2 diabetes mellitus with diabetic peripheral angiopathy without gangrene: Secondary | ICD-10-CM | POA: Diagnosis not present

## 2021-02-23 DIAGNOSIS — H6123 Impacted cerumen, bilateral: Secondary | ICD-10-CM | POA: Diagnosis not present

## 2021-02-23 DIAGNOSIS — F32A Depression, unspecified: Secondary | ICD-10-CM | POA: Diagnosis not present

## 2021-02-27 DIAGNOSIS — I272 Pulmonary hypertension, unspecified: Secondary | ICD-10-CM | POA: Diagnosis not present

## 2021-02-27 DIAGNOSIS — I69398 Other sequelae of cerebral infarction: Secondary | ICD-10-CM | POA: Diagnosis not present

## 2021-02-27 DIAGNOSIS — J449 Chronic obstructive pulmonary disease, unspecified: Secondary | ICD-10-CM | POA: Diagnosis not present

## 2021-02-27 DIAGNOSIS — Z85118 Personal history of other malignant neoplasm of bronchus and lung: Secondary | ICD-10-CM | POA: Diagnosis not present

## 2021-03-08 DIAGNOSIS — N178 Other acute kidney failure: Secondary | ICD-10-CM | POA: Diagnosis not present

## 2021-03-16 DIAGNOSIS — F32A Depression, unspecified: Secondary | ICD-10-CM | POA: Diagnosis not present

## 2021-03-16 DIAGNOSIS — R531 Weakness: Secondary | ICD-10-CM | POA: Diagnosis not present

## 2021-03-16 DIAGNOSIS — R399 Unspecified symptoms and signs involving the genitourinary system: Secondary | ICD-10-CM | POA: Diagnosis not present

## 2021-03-16 DIAGNOSIS — E1140 Type 2 diabetes mellitus with diabetic neuropathy, unspecified: Secondary | ICD-10-CM | POA: Diagnosis not present

## 2021-03-16 DIAGNOSIS — I5032 Chronic diastolic (congestive) heart failure: Secondary | ICD-10-CM | POA: Diagnosis not present

## 2021-03-16 DIAGNOSIS — E785 Hyperlipidemia, unspecified: Secondary | ICD-10-CM | POA: Diagnosis not present

## 2021-03-20 ENCOUNTER — Emergency Department (HOSPITAL_COMMUNITY): Payer: PPO

## 2021-03-20 ENCOUNTER — Encounter (HOSPITAL_COMMUNITY): Payer: Self-pay | Admitting: *Deleted

## 2021-03-20 ENCOUNTER — Other Ambulatory Visit: Payer: Self-pay

## 2021-03-20 ENCOUNTER — Emergency Department (HOSPITAL_COMMUNITY)
Admission: EM | Admit: 2021-03-20 | Discharge: 2021-03-20 | Disposition: A | Discharge status: Home / Self Care | Payer: PPO | Attending: Emergency Medicine | Admitting: Emergency Medicine

## 2021-03-20 DIAGNOSIS — R001 Bradycardia, unspecified: Secondary | ICD-10-CM | POA: Insufficient documentation

## 2021-03-20 DIAGNOSIS — Z7984 Long term (current) use of oral hypoglycemic drugs: Secondary | ICD-10-CM | POA: Diagnosis not present

## 2021-03-20 DIAGNOSIS — I110 Hypertensive heart disease with heart failure: Secondary | ICD-10-CM | POA: Diagnosis not present

## 2021-03-20 DIAGNOSIS — R609 Edema, unspecified: Secondary | ICD-10-CM | POA: Diagnosis not present

## 2021-03-20 DIAGNOSIS — I5033 Acute on chronic diastolic (congestive) heart failure: Secondary | ICD-10-CM | POA: Insufficient documentation

## 2021-03-20 DIAGNOSIS — R0602 Shortness of breath: Secondary | ICD-10-CM | POA: Insufficient documentation

## 2021-03-20 DIAGNOSIS — J449 Chronic obstructive pulmonary disease, unspecified: Secondary | ICD-10-CM | POA: Diagnosis not present

## 2021-03-20 DIAGNOSIS — Z87891 Personal history of nicotine dependence: Secondary | ICD-10-CM | POA: Diagnosis not present

## 2021-03-20 DIAGNOSIS — R739 Hyperglycemia, unspecified: Secondary | ICD-10-CM | POA: Diagnosis not present

## 2021-03-20 DIAGNOSIS — Z85118 Personal history of other malignant neoplasm of bronchus and lung: Secondary | ICD-10-CM | POA: Diagnosis not present

## 2021-03-20 DIAGNOSIS — Z7902 Long term (current) use of antithrombotics/antiplatelets: Secondary | ICD-10-CM | POA: Diagnosis not present

## 2021-03-20 DIAGNOSIS — N179 Acute kidney failure, unspecified: Secondary | ICD-10-CM | POA: Diagnosis not present

## 2021-03-20 DIAGNOSIS — E119 Type 2 diabetes mellitus without complications: Secondary | ICD-10-CM | POA: Insufficient documentation

## 2021-03-20 DIAGNOSIS — R0902 Hypoxemia: Secondary | ICD-10-CM | POA: Diagnosis not present

## 2021-03-20 DIAGNOSIS — I10 Essential (primary) hypertension: Secondary | ICD-10-CM | POA: Diagnosis not present

## 2021-03-20 LAB — URINALYSIS, ROUTINE W REFLEX MICROSCOPIC
Bilirubin Urine: NEGATIVE
Glucose, UA: NEGATIVE mg/dL
Hgb urine dipstick: NEGATIVE
Ketones, ur: NEGATIVE mg/dL
Leukocytes,Ua: NEGATIVE
Nitrite: NEGATIVE
Protein, ur: NEGATIVE mg/dL
Specific Gravity, Urine: 1.02 (ref 1.005–1.030)
pH: 6 (ref 5.0–8.0)

## 2021-03-20 LAB — CBC WITH DIFFERENTIAL/PLATELET
Abs Immature Granulocytes: 0.05 10*3/uL (ref 0.00–0.07)
Basophils Absolute: 0.1 10*3/uL (ref 0.0–0.1)
Basophils Relative: 1 %
Eosinophils Absolute: 0.3 10*3/uL (ref 0.0–0.5)
Eosinophils Relative: 2 %
HCT: 38.1 % (ref 36.0–46.0)
Hemoglobin: 12.1 g/dL (ref 12.0–15.0)
Immature Granulocytes: 1 %
Lymphocytes Relative: 11 %
Lymphs Abs: 1.2 10*3/uL (ref 0.7–4.0)
MCH: 30.1 pg (ref 26.0–34.0)
MCHC: 31.8 g/dL (ref 30.0–36.0)
MCV: 94.8 fL (ref 80.0–100.0)
Monocytes Absolute: 0.6 10*3/uL (ref 0.1–1.0)
Monocytes Relative: 6 %
Neutro Abs: 8.8 10*3/uL — ABNORMAL HIGH (ref 1.7–7.7)
Neutrophils Relative %: 79 %
Platelets: 263 10*3/uL (ref 150–400)
RBC: 4.02 MIL/uL (ref 3.87–5.11)
RDW: 11.8 % (ref 11.5–15.5)
WBC: 11 10*3/uL — ABNORMAL HIGH (ref 4.0–10.5)
nRBC: 0 % (ref 0.0–0.2)

## 2021-03-20 LAB — COMPREHENSIVE METABOLIC PANEL
ALT: 24 U/L (ref 0–44)
AST: 22 U/L (ref 15–41)
Albumin: 3.8 g/dL (ref 3.5–5.0)
Alkaline Phosphatase: 61 U/L (ref 38–126)
Anion gap: 10 (ref 5–15)
BUN: 19 mg/dL (ref 8–23)
CO2: 32 mmol/L (ref 22–32)
Calcium: 9 mg/dL (ref 8.9–10.3)
Chloride: 91 mmol/L — ABNORMAL LOW (ref 98–111)
Creatinine, Ser: 2.37 mg/dL — ABNORMAL HIGH (ref 0.44–1.00)
GFR, Estimated: 20 mL/min — ABNORMAL LOW (ref >60)
Glucose, Bld: 167 mg/dL — ABNORMAL HIGH (ref 70–99)
Potassium: 4.4 mmol/L (ref 3.5–5.1)
Sodium: 133 mmol/L — ABNORMAL LOW (ref 135–145)
Total Bilirubin: 0.5 mg/dL (ref 0.3–1.2)
Total Protein: 6.8 g/dL (ref 6.5–8.1)

## 2021-03-20 LAB — TROPONIN I (HIGH SENSITIVITY)
Troponin I (High Sensitivity): 12 ng/L (ref <18)
Troponin I (High Sensitivity): 11 ng/L (ref <18)

## 2021-03-20 LAB — LIPASE, BLOOD: Lipase: 65 U/L — ABNORMAL HIGH (ref 11–51)

## 2021-03-20 NOTE — ED Triage Notes (Signed)
Pt arrived by [facility] from home. Caregiver reported patient having increase in sob and abd swelling x 4-5 days. Patient has no complaints. Patient recently treated for UTI, hx of COPD and on 3L [place] at baseline.

## 2021-03-20 NOTE — ED Provider Notes (Signed)
[facility] EMERGENCY DEPARTMENT Provider Note   CSN: [id number] Arrival date & time: 03/20/21  1311     History Chief Complaint  Patient presents with   Shortness of Breath    Caitlin Oliver is a 80 y.o. female.  The history is provided by the patient and medical records.  Shortness of Breath Severity:  Moderate Onset quality:  Gradual Duration:  3 weeks Timing:  Constant Progression:  Unchanged Chronicity:  Chronic Relieved by:  Nothing Worsened by:  Nothing Associated symptoms: no abdominal pain, no chest pain, no cough, no ear pain, no fever, no rash, no sore throat and no vomiting       Past Medical History:  Diagnosis Date   Anxiety    Arthritis    Cancer ([place])    lung cancer   Carotid artery disease ([place])    Cataracts, bilateral    CHF (congestive heart failure) (HCC)    Claudication (HCC)    COPD (chronic obstructive pulmonary disease) ([place])    Diabetes mellitus    Dizzy    Family history of adverse reaction to anesthesia    Pt nephew has PONV   GERD (gastroesophageal reflux disease)    Headache    History of kidney stones    Hypercholesteremia    Hypertension    Peripheral arterial disease ([place])    bilateral iliac artery stenosis by angiography   Pneumonia    Stomach ulcer    Stroke ([place])    Tobacco abuse    Wears glasses     Patient Active Problem List   Diagnosis Date Noted   Abdominal pain 12/08/2019   Acute on chronic heart failure with preserved ejection fraction (HFpEF) (HCC)    Unstable angina (HCC)    Acute on chronic combined systolic and diastolic CHF (congestive heart failure) ([place]) 11/13/2018   Acute CVA (cerebrovascular accident) ([place]) 06/17/2018   Syncope 06/16/2018   Chest pain 06/16/2018   Pain 09/23/2017   Cervicalgia 07/16/2017   Pain in both upper extremities 06/03/2017   Pain in both lower extremities 06/03/2017   Bilateral carotid artery stenosis 06/03/2017   Benign paroxysmal positional vertigo  10/23/2016   Headache 06/19/2016   Cancer of left lung ([place]) 02/03/2016   COPD GOLD II 12/08/2015   Solitary pulmonary nodule 12/08/2015   Bilateral occipital neuralgia 11/22/2015   Double vision 10/28/2015   TIA (transient ischemic attack) 10/28/2015   Subclavian artery stenosis, left ([place]) 10/28/2015   Subclavian steal syndrome 10/28/2015   Acute blood loss anemia 10/20/2013   C. difficile colitis 10/19/2013   GI bleed 10/18/2013   COPD 10/18/2013   Bleeding gastrointestinal 10/18/2013   Hypoxia 10/10/2013   Bronchitis 10/08/2013   PVD (peripheral vascular disease) with claudication ([place]) 07/29/2013   Carotid stenosis 05/07/2013   Stenosis of left carotid artery 05/07/2013   Occlusion and stenosis of carotid artery without mention of cerebral infarction 03/02/2013   Carotid artery obstruction 03/02/2013   Carotid artery disease ([place]) 02/27/2013   Tobacco abuse 02/27/2013   Compulsive tobacco user syndrome 02/27/2013   Claudication ([place]) 01/20/2013   Essential hypertension 01/20/2013   Type 2 diabetes mellitus ([place]) 01/20/2013   Hyperlipidemia 01/20/2013    Past Surgical History:  Procedure Laterality Date   ABDOMINAL AORTIC ENDOVASCULAR STENT GRAFT Right 06/19/2019   Procedure: INNOMINATE STENT, RIGHT CAROTID STENT, RIGHT SUBCLAVIAN STENT AND CAROTID CUTDOWN;  Surgeon: Marty Heck, MD;  Location: [facility] OR;  Service: Vascular;  Laterality: Right;   ABDOMINAL  HYSTERECTOMY     APPENDECTOMY     BREAST REDUCTION SURGERY     CAROTID ANGIOGRAM N/A 02/25/2013   Procedure: CAROTID ANGIOGRAM;  Surgeon: Lorretta Harp, MD;  Location: [facility] CATH LAB;  Service: Cardiovascular;  Laterality: N/A;   ENDARTERECTOMY Right 03/06/2013   Procedure: ENDARTERECTOMY CAROTID-RIGHT;  Surgeon: Serafina Mitchell, MD;  Location: [place];  Service: Vascular;  Laterality: Right;   ENDARTERECTOMY Left 05/07/2013   Procedure: LEFT CAROTID ARTERY ENDARTERECTOMY WITH VASCU-GUARD PATCH ANGIOPLASTY ;  Surgeon:  Serafina Mitchell, MD;  Location: [place];  Service: Vascular;  Laterality: Left;   INTRAOPERATIVE ARTERIOGRAM N/A 06/19/2019   Procedure: Clydie Braun;  Surgeon: Marty Heck, MD;  Location: [place];  Service: Vascular;  Laterality: N/A;   IR ANGIO INTRA EXTRACRAN SEL COM CAROTID INNOMINATE UNI R MOD SED  05/14/2019   IR ANGIO VERTEBRAL SEL SUBCLAVIAN INNOMINATE BILAT MOD SED  05/14/2019   LOBECTOMY Left 02/03/2016   Procedure: LEFT UPPER LOBECTOMY;  Surgeon: [name], MD;  Location: [place];  Service: Thoracic;  Laterality: Left;   LOWER EXTREMITY ANGIOGRAM N/A 02/25/2013   Procedure: LOWER EXTREMITY ANGIOGRAM;  Surgeon: Lorretta Harp, MD;  Location: [facility] CATH LAB;  Service: Cardiovascular;  Laterality: N/A;   LOWER EXTREMITY ANGIOGRAM N/A 07/27/2013   Procedure: LOWER EXTREMITY ANGIOGRAM;  Surgeon: Lorretta Harp, MD;  Location: [facility] CATH LAB;  Service: Cardiovascular;  Laterality: N/A;   PATCH ANGIOPLASTY Right 03/06/2013   Procedure: PATCH ANGIOPLASTY of Right Carotid Artery using Vascu-Guard Patch;  Surgeon: Serafina Mitchell, MD;  Location: [place];  Service: Vascular;  Laterality: Right;   PERIPHERAL VASCULAR CATHETERIZATION N/A 11/15/2015   Procedure: Aortic Arch Angiography;  Surgeon: Serafina Mitchell, MD;  Location: [place] CV LAB;  Service: Cardiovascular;  Laterality: N/A;   PERIPHERAL VASCULAR CATHETERIZATION Bilateral 11/15/2015   Procedure: Carotid Angiography;  Surgeon: Serafina Mitchell, MD;  Location: [place] CV LAB;  Service: Cardiovascular;  Laterality: Bilateral;   PERIPHERAL VASCULAR CATHETERIZATION Left 11/15/2015   Procedure: Upper Extremity Angiography;  Surgeon: Serafina Mitchell, MD;  Location: [place] CV LAB;  Service: Cardiovascular;  Laterality: Left;   PERIPHERAL VASCULAR CATHETERIZATION Left 11/15/2015   Procedure: Peripheral Vascular Intervention;  Surgeon: Serafina Mitchell, MD;  Location: [place] CV LAB;  Service: Cardiovascular;  Laterality: Left;   subclavian    RIGHT HEART CATH N/A 06/16/2019   Procedure: RIGHT HEART CATH;  Surgeon: Jolaine Artist, MD;  Location: [place] CV LAB;  Service: Cardiovascular;  Laterality: N/A;   RIGHT/LEFT HEART CATH AND CORONARY ANGIOGRAPHY N/A 11/20/2018   Procedure: RIGHT/LEFT HEART CATH AND CORONARY ANGIOGRAPHY;  Surgeon: Nelva Bush, MD;  Location: [place] CV LAB;  Service: Cardiovascular;  Laterality: N/A;   SALIVARY GLAND SURGERY     scar tissue removed from left saliva glad   TUBAL LIGATION     ULTRASOUND GUIDANCE FOR VASCULAR ACCESS Right 06/19/2019   Procedure: Ultrasound Guidance For Vascular Access;  Surgeon: Marty Heck, MD;  Location: [place];  Service: Vascular;  Laterality: Right;   VIDEO ASSISTED THORACOSCOPY (VATS)/WEDGE RESECTION Left 02/03/2016   Procedure: VIDEO ASSISTED THORACOSCOPY;  Surgeon: [name], MD;  Location: [place];  Service: Thoracic;  Laterality: Left;     OB History   No obstetric history on file.     Family History  Problem Relation Age of Onset   Stroke Mother    Hypertension Mother    Kidney failure Father  Social History   Tobacco Use   Smoking status: Former    Packs/day: 1.50    Years: 56.00    Pack years: 84.00    Types: Cigarettes    Quit date: 07/07/2013    Years since quitting: 7.7   Smokeless tobacco: Former    Quit date: 02/25/2013  Vaping Use   Vaping Use: Some days  Substance Use Topics   Alcohol use: No   Drug use: No    Home Medications Prior to Admission medications   Medication Sig Start Date End Date Taking? Authorizing Provider  acetaminophen (TYLENOL) 500 MG tablet Take 500 mg by mouth every 6 (six) hours as needed for moderate pain.   Yes [provider]  albuterol (PROVENTIL HFA;VENTOLIN HFA) 108 (90 Base) MCG/ACT inhaler Inhale 2 puffs into the lungs every 6 (six) hours as needed for wheezing or shortness of breath.  07/11/17  Yes [provider]  albuterol (PROVENTIL) (2.5  MG/3ML) 0.083% nebulizer solution Take 2.5 mg by nebulization in the morning and at bedtime.    Yes [provider]  Ascorbic Acid (VITAMIN C) 1000 MG tablet Take 1,000 mg by mouth daily.   Yes [provider]  Biotin 1000 MCG tablet Take 1,000 mcg by mouth daily.   Yes [provider]  Calcium Carb-Cholecalciferol (CALCIUM CARBONATE-VITAMIN D3 PO) Take 1 tablet by mouth daily in the afternoon.   Yes [provider]  clidinium-chlordiazePOXIDE (LIBRAX) 5-2.5 MG capsule Take 1 capsule by mouth 2 (two) times daily.   Yes [provider]  clopidogrel (PLAVIX) 75 MG tablet Take 1 tablet (75 mg total) by mouth daily. 06/20/18  Yes Thurnell Lose, MD  diazepam (VALIUM) 5 MG tablet Take 5 mg by mouth every 8 (eight) hours as needed for anxiety. 09/08/19  Yes [provider]  dicyclomine (BENTYL) 20 MG tablet Take 20 mg by mouth daily as needed for spasms.   Yes [provider]  docusate sodium (COLACE) 100 MG capsule Take 100 mg by mouth 2 (two) times daily as needed for mild constipation.   Yes [provider]  ferrous sulfate 325 (65 FE) MG tablet Take 325 mg by mouth daily with breakfast.   Yes [provider]  FLUoxetine (PROZAC) 20 MG capsule Take 20 mg by mouth daily.   Yes [provider]  gabapentin (NEURONTIN) 300 MG capsule Take 300 mg by mouth at bedtime.   Yes [provider]  HYDROcodone-acetaminophen (NORCO/VICODIN) 5-325 MG tablet Take 1 tablet by mouth every 6 (six) hours as needed for moderate pain. 06/21/19  Yes Dagoberto Ligas, PA-C  Magnesium 250 MG TABS Take 250 mg by mouth daily.   Yes [provider]  metFORMIN (GLUCOPHAGE) 500 MG tablet Take by mouth 2 (two) times daily with a meal.   Yes [provider]  metoprolol tartrate (LOPRESSOR) 50 MG tablet Take 50 mg by mouth 2 (two) times daily.   Yes [provider]  naphazoline-glycerin (CLEAR EYES REDNESS)  0.012-0.25 % SOLN 1-2 drops 4 (four) times daily as needed for eye irritation.   Yes [provider]  Omega-3 Fatty Acids (FISH OIL) 1200 MG CAPS Take 1,200 mg by mouth daily.   Yes [provider]  ondansetron (ZOFRAN) 4 MG tablet Take 4 mg by mouth at bedtime as needed for nausea or vomiting.  06/05/18  Yes [provider]  pantoprazole (PROTONIX) 40 MG tablet Take 40 mg by mouth 2 (two) times daily.  12/17/13  Yes [provider]  polyethylene glycol (MIRALAX / GLYCOLAX) 17 g packet Take 17 g by mouth daily as needed for mild constipation. 12/02/18  Yes [provider]  potassium chloride SA (KLOR-CON M) 20 MEQ tablet Take 20 mEq by mouth daily.   Yes [provider]  pravastatin (PRAVACHOL) 40 MG tablet Take 20 mg by mouth daily before supper.    Yes [provider]  torsemide (DEMADEX) 20 MG tablet Take 2 tablets (40 mg total) by mouth twice a day 01/06/21  Yes Bensimhon, Shaune Pascal, MD  traZODone (DESYREL) 50 MG tablet Take 50 mg by mouth at bedtime.   Yes [provider]  gabapentin (NEURONTIN) 100 MG capsule Take 1 capsule (100 mg total) by mouth 3 (three) times daily. Patient not taking: Reported on 03/20/2021 11/24/18   Swayze, Ava, DO  metolazone (ZAROXOLYN) 5 MG tablet Take only as directed by the CHF clinic Patient taking differently: Take 5 mg by mouth daily. 10/14/20   Bensimhon, Shaune Pascal, MD  ONE TOUCH ULTRA TEST test strip  09/12/16   [provider]  OXYGEN Inhale into the lungs. 2 liters n/c    [provider]  potassium chloride (KLOR-CON) 10 MEQ tablet Take 2 tablets (20 mEq total) by mouth daily. Patient not taking: Reported on 03/20/2021 09/02/20   Bensimhon, Shaune Pascal, MD    Allergies    Fentanyl  Review of Systems   Review of Systems  Constitutional:  Negative for chills and fever.  HENT:  Negative for ear pain and sore throat.   Eyes:  Negative for pain and visual disturbance.  Respiratory:   Positive for chest tightness and shortness of breath. Negative for cough.   Cardiovascular:  Negative for chest pain and palpitations.  Gastrointestinal:  Negative for abdominal pain and vomiting.  Genitourinary:  Negative for dysuria and hematuria.  Musculoskeletal:  Negative for arthralgias and back pain.  Skin:  Negative for color change and rash.  Neurological:  Negative for seizures and syncope.  All other systems reviewed and are negative.  Physical Exam Updated Vital Signs BP 131/79   Pulse 75   Temp 99.2 F (37.3 C) (Oral)   Resp (!) 22   SpO2 98%   Physical Exam Vitals and nursing note reviewed.  Constitutional:      General: She is not in acute distress.    Appearance: Normal appearance. She is well-developed. She is not ill-appearing or toxic-appearing.  HENT:     Head: Normocephalic and atraumatic.     Right Ear: External ear normal.     Left Ear: External ear normal.     Nose: Nose normal. No congestion or rhinorrhea.     Mouth/Throat:     Mouth: Mucous membranes are moist.  Eyes:     Extraocular Movements: Extraocular movements intact.     Conjunctiva/sclera: Conjunctivae normal.     Pupils: Pupils are equal, round, and reactive to light.  Cardiovascular:     Rate and Rhythm: Normal rate and regular rhythm.     Pulses: Normal pulses.     Heart sounds: No murmur heard. Pulmonary:     Effort: Pulmonary effort is normal. No respiratory distress.     Breath sounds: Normal breath sounds. No wheezing, rhonchi or rales.     Comments: Wearing 2.5 L O2 via [place] Abdominal:     General: Abdomen is flat. Bowel sounds are normal.     Palpations: Abdomen is soft.     Tenderness: There  is no abdominal tenderness. There is no guarding or rebound.  Musculoskeletal:        General: No swelling, tenderness or deformity.     Cervical back: Normal range of motion and neck supple. No rigidity.     Right lower leg: No tenderness. No edema.     Left lower leg: No tenderness. No  edema.  Skin:    General: Skin is warm and dry.     Capillary Refill: Capillary refill takes less than 2 seconds.  Neurological:     General: No focal deficit present.     Mental Status: She is alert and oriented to person, place, and time.  Psychiatric:        Mood and Affect: Mood normal.    ED Results / Procedures / Treatments   Labs (all labs ordered are listed, but only abnormal results are displayed) Labs Reviewed  CBC WITH DIFFERENTIAL/PLATELET - Abnormal; Notable for the following components:      Result Value   WBC 11.0 (*)    Neutro Abs 8.8 (*)    All other components within normal limits  COMPREHENSIVE METABOLIC PANEL - Abnormal; Notable for the following components:   Sodium 133 (*)    Chloride 91 (*)    Glucose, Bld 167 (*)    Creatinine, Ser 2.37 (*)    GFR, Estimated 20 (*)    All other components within normal limits  LIPASE, BLOOD - Abnormal; Notable for the following components:   Lipase 65 (*)    All other components within normal limits  RESP PANEL BY RT-PCR (FLU A&B, COVID) ARPGX2  URINALYSIS, ROUTINE W REFLEX MICROSCOPIC  BRAIN NATRIURETIC PEPTIDE  TROPONIN I (HIGH SENSITIVITY)  TROPONIN I (HIGH SENSITIVITY)    EKG EKG Interpretation  Date/Time:  Monday March 20 2021 15:37:57 EST Ventricular Rate:  54 PR Interval:  169 QRS Duration: 75 QT Interval:  468 QTC Calculation: 444 R Axis:   86 Text Interpretation: Sinus rhythm Borderline right axis deviation Confirmed by Elnora Morrison [id number]) on 03/20/2021 6:34:04 PM  Radiology DG Chest 2 View  Result Date: 03/20/2021 CLINICAL DATA:  Shortness of breath EXAM: CHEST - 2 VIEW COMPARISON:  11/19/2018 FINDINGS: Chronic blunting of left costophrenic angle. Chronic interstitial changes. Mild scarring/atelectasis at the lung bases. No pneumothorax. Stable cardiomediastinal contours with normal heart size. Vascular stents are present. IMPRESSION: Mild scarring/atelectasis at the lung bases. Electronically  Signed   By: Macy Mis M.D.   On: 03/20/2021 14:28    Procedures Procedures   Medications Ordered in ED Medications - No data to display  ED Course  I have reviewed the triage vital signs and the nursing notes.  Pertinent labs & imaging results that were available during my care of the patient were reviewed by me and considered in my medical decision making (see chart for details).    MDM Rules/Calculators/A&P                          This is a 81 year old female with a history of COPD on 3 L of O2 at baseline.  She was sent to the ED for evaluation after her caregiver felt that she was more short of breath than her baseline.  She is afebrile and hemodynamically stable on arrival.  She is satting appropriately on her home dose oxygen.  She is in no respiratory distress.  Her lungs are clear to auscultation bilaterally.  Her EKG showed sinus bradycardia to  36 with no acute ischemic changes.  Initial troponin normal. Delta pending.  Chest x-ray showed mild scarring at the lung bases.  No acute infiltrates.  She is afebrile.  She has no productive cough.  Low concern for COPD exacerbation or pneumonia.  She has trace peripheral edema on exam.  But no cardiomegaly or interstitial edema on x-ray.  Low concern for heart failure exacerbation. She is not tachycardic, has no pleuritic chest pain, no provocative causes for VTE. Low concerns for PE. COVID and flu negative.  CMP did reveal mild AKI, creatinine 2.37, from baseline of 2.  She is eating and drinking.  Will encourage close follow-up with PCP for repeat labs.    Patient reassessed.  She is resting comfortably.  No new complaints.  Delta troponin negative.  Low concern for ACS.  She is appropriate for discharge home with close follow-up with PCP.  She has access to her inhalers at home and does not need any medication refills.  Strict return to ED precautions provided.  Final Clinical Impression(s) / ED Diagnoses Final diagnoses:   Shortness of breath    Rx / DC Orders ED Discharge Orders     None        Idamae Lusher, MD 03/20/21 1932    Elnora Morrison, MD 03/20/21 [phone]

## 2021-03-20 NOTE — ED Provider Notes (Signed)
Emergency Medicine Provider Triage Evaluation Note  Caitlin Oliver , a 80 y.o. female  was evaluated in triage.  Pt complains of sob ongoing for 3 weeks. Has had to increase her o2 this week  PCP note indicated pt has been treated with bactrim for sinusitis and possible UTI  Review of Systems  Positive: Sob Negative: fever  Physical Exam  BP 99/64 (BP Location: Left Arm)   Pulse (!) 55   Temp 99.2 F (37.3 C) (Oral)   Resp 20   SpO2 100%  Gen:   Awake, no distress   Resp:  Normal effort  MSK:   Moves extremities without difficulty  Other:  Dyspneic with conversation  Medical Decision Making  Medically screening exam initiated at 1:56 PM.  Appropriate orders placed.  Caitlin Oliver was informed that the remainder of the evaluation will be completed by another provider, this initial triage assessment does not replace that evaluation, and the importance of remaining in the ED until their evaluation is complete.     Bishop Dublin 03/20/21 1356    Davonna Belling, MD 03/21/21 1230

## 2021-03-20 NOTE — ED Notes (Signed)
DC instructions reviewed with pt. PT verbalized understanding. PT DC °

## 2021-03-20 NOTE — ED Notes (Signed)
[place] sandwhich and ginger ale provided per EDP.

## 2021-03-21 NOTE — Progress Notes (Incomplete)
ADVANCED HF CLINIC NOTE  Referring Physician: Dr Gwenlyn Found  Primary Care: Primary Cardiologist: Dr Gwenlyn Found Pulmonary: Dr Melvyn Novas   HPI  Ms Acrey is an 80 year old with history of COPD, DM, PAD, LCEA 2015, R CEA 2014, GERD, hyperlipidemia, anxiety, HTN,chronic respiratory failure on 2 liters at home, lung cancer, S/P L upper lobectomy 2017, and chronic diastolic heart failure.   Admitted 06/16/18 with difficulty walking with ataxia. Code Stroke called. R precentral gyrus 5 mm ischemic stroke noted. Discharged on oxygen. Discharged on asa and plavix. She was later discharged to SNF for an extended stay.   Admitted to [facility] on 11/13/18  with increased shortness of breath and chest pain. Diuresed with IV lasix and transitioned to lasix 40 mg twice a day. Had RHC/LHC with mod pulmonary htn and elevated filling pressures.  S/p repair 06/19/19 with right carotid exposure with common carotid and innominate artery stenting as well as subclavian artery stenting via brachial approach by Dr. Carlis Abbott.  Here for routine f/u. Feeling pretty good. Wearing oxygen regularly. Gets around with walker. Can go from one room to the next and then has to rest. No CP. Edema improved. No orthopnea or PND. Taking torsemide 40 bid. Doesn't take extra.    PFTs 11/20 FEV1 1.25 (62%) FVC 2.03 (75%) DLCO 51%  PFTs 8/17 FEV1 1.77 (84%) FVC 2.49 (88%) DLCO 69%  RHC/LHC  11/20/2018  RA 11 PAP 79/39 (52)  PCWP 31 PVR 4.9  CO 4.3  CI 2.9  Mild to moderate, non-obstructive coronary artery disease. Severe pulmonary hypertension. Moderately to severely elevated left and right heart filling pressures. Mildly reduced Fick cardiac output/index. Severe right brachiocephalic and subclavian artery stenoses with combined gradient of ~50 mmHg and possible reversal of flow in the right vertebral artery (subclavian steal).  ECHO 11/14/18 EF 65%  Moderate Asymmetric hypertrophy septal wall. RV mild reduced. Small pericardial effusion.     Past Medical History:  Diagnosis Date   Anxiety    Arthritis    Cancer ([place])    lung cancer   Carotid artery disease (HCC)    Cataracts, bilateral    CHF (congestive heart failure) (HCC)    Claudication (HCC)    COPD (chronic obstructive pulmonary disease) ([place])    Diabetes mellitus    Dizzy    Family history of adverse reaction to anesthesia    Pt nephew has PONV   GERD (gastroesophageal reflux disease)    Headache    History of kidney stones    Hypercholesteremia    Hypertension    Peripheral arterial disease (HCC)    bilateral iliac artery stenosis by angiography   Pneumonia    Stomach ulcer    Stroke ([place])    Tobacco abuse    Wears glasses     Current Outpatient Medications  Medication Sig Dispense Refill   acetaminophen (TYLENOL) 500 MG tablet Take 500 mg by mouth every 6 (six) hours as needed for moderate pain.     albuterol (PROVENTIL HFA;VENTOLIN HFA) 108 (90 Base) MCG/ACT inhaler Inhale 2 puffs into the lungs every 6 (six) hours as needed for wheezing or shortness of breath.      albuterol (PROVENTIL) (2.5 MG/3ML) 0.083% nebulizer solution Take 2.5 mg by nebulization in the morning and at bedtime.      Ascorbic Acid (VITAMIN C) 1000 MG tablet Take 1,000 mg by mouth daily.     Biotin 1000 MCG tablet Take 1,000 mcg by mouth daily.  Calcium Carb-Cholecalciferol (CALCIUM CARBONATE-VITAMIN D3 PO) Take 1 tablet by mouth daily in the afternoon.     clidinium-chlordiazePOXIDE (LIBRAX) 5-2.5 MG capsule Take 1 capsule by mouth 2 (two) times daily.     clopidogrel (PLAVIX) 75 MG tablet Take 1 tablet (75 mg total) by mouth daily.     diazepam (VALIUM) 5 MG tablet Take 5 mg by mouth every 8 (eight) hours as needed for anxiety.     dicyclomine (BENTYL) 20 MG tablet Take 20 mg by mouth daily as needed for spasms.     docusate sodium (COLACE) 100 MG capsule Take 100 mg by mouth 2 (two) times daily as needed for mild constipation.     ferrous sulfate 325 (65 FE) MG tablet  Take 325 mg by mouth daily with breakfast.     FLUoxetine (PROZAC) 20 MG capsule Take 20 mg by mouth daily.     gabapentin (NEURONTIN) 100 MG capsule Take 1 capsule (100 mg total) by mouth 3 (three) times daily. (Patient not taking: Reported on 03/20/2021) 90 capsule 0   gabapentin (NEURONTIN) 300 MG capsule Take 300 mg by mouth at bedtime.     HYDROcodone-acetaminophen (NORCO/VICODIN) 5-325 MG tablet Take 1 tablet by mouth every 6 (six) hours as needed for moderate pain. 15 tablet 0   Magnesium 250 MG TABS Take 250 mg by mouth daily.     metFORMIN (GLUCOPHAGE) 500 MG tablet Take by mouth 2 (two) times daily with a meal.     metolazone (ZAROXOLYN) 5 MG tablet Take only as directed by the CHF clinic (Patient taking differently: Take 5 mg by mouth daily.) 10 tablet 0   metoprolol tartrate (LOPRESSOR) 50 MG tablet Take 50 mg by mouth 2 (two) times daily.     naphazoline-glycerin (CLEAR EYES REDNESS) 0.012-0.25 % SOLN 1-2 drops 4 (four) times daily as needed for eye irritation.     Omega-3 Fatty Acids (FISH OIL) 1200 MG CAPS Take 1,200 mg by mouth daily.     ondansetron (ZOFRAN) 4 MG tablet Take 4 mg by mouth at bedtime as needed for nausea or vomiting.      ONE TOUCH ULTRA TEST test strip      OXYGEN Inhale into the lungs. 2 liters n/c     pantoprazole (PROTONIX) 40 MG tablet Take 40 mg by mouth 2 (two) times daily.      polyethylene glycol (MIRALAX / GLYCOLAX) 17 g packet Take 17 g by mouth daily as needed for mild constipation.     potassium chloride (KLOR-CON) 10 MEQ tablet Take 2 tablets (20 mEq total) by mouth daily. (Patient not taking: Reported on 03/20/2021) 180 tablet 3   potassium chloride SA (KLOR-CON M) 20 MEQ tablet Take 20 mEq by mouth daily.     pravastatin (PRAVACHOL) 40 MG tablet Take 20 mg by mouth daily before supper.      torsemide (DEMADEX) 20 MG tablet Take 2 tablets (40 mg total) by mouth twice a day 360 tablet 0   traZODone (DESYREL) 50 MG tablet Take 50 mg by mouth at bedtime.      No current facility-administered medications for this visit.    Allergies  Allergen Reactions   Fentanyl Shortness Of Breath      Social History   Socioeconomic History   Marital status: Widowed    Spouse name: Not on file   Number of children: Not on file   Years of education: Not on file   Highest education level: Not on file  Occupational  History   Not on file  Tobacco Use   Smoking status: Former    Packs/day: 1.50    Years: 56.00    Pack years: 84.00    Types: Cigarettes    Quit date: 07/07/2013    Years since quitting: 7.7   Smokeless tobacco: Former    Quit date: 02/25/2013  Vaping Use   Vaping Use: Some days  Substance and Sexual Activity   Alcohol use: No   Drug use: No   Sexual activity: Never  Other Topics Concern   Not on file  Social History Narrative   Not on file   Social Determinants of Health   Financial Resource Strain: Not on file  Food Insecurity: Not on file  Transportation Needs: Not on file  Physical Activity: Not on file  Stress: Not on file  Social Connections: Not on file  Intimate Partner Violence: Not on file      Family History  Problem Relation Age of Onset   Stroke Mother    Hypertension Mother    Kidney failure Father     There were no vitals filed for this visit.  Wt Readings from Last 3 Encounters:  11/02/20 77.2 kg (170 lb 3.2 oz)  01/12/20 76.7 kg (169 lb)  12/08/19 78.9 kg (174 lb)    PHYSICAL EXAM: General:  Elderly woman sitting in WC on O2. SOB with talking  HEENT: normal Neck: supple. Jvp 10 Carotids 2+ bilat; no bruits. No lymphadenopathy or thryomegaly appreciated. Cor: PMI nondisplaced. Regular rate & rhythm. No rubs, gallops or murmurs. Lungs: markedly decreased throughout  Abdomen: soft, nontender, nondistended. No hepatosplenomegaly. No bruits or masses. Good bowel sounds. Extremities: no cyanosis, clubbing, rash, 1+ edema L > R Neuro: alert & orientedx3, cranial nerves grossly intact. moves  all 4 extremities w/o difficulty. Affect pleasant   ASSESSMENT & PLAN:  1. Pulmonary HTN, Mixed presentation.  - she has mixed PAH/PVH. RHC (8/20)  RA 11,PAP 79/39 (52) ,PCWP 31, PVR 4.8, CO 4.3 , CI 2.9 - suspect combination of WHO Group II and III  - PFTs with obstructive/restrictive picture -  Refused sleep study - Refuses Pulmonary Rehab due to distance to travel - Not candidate for selective pulmonary artery vasodilators - Continue wearing O2.   2. Chronic diastolic HF - ECHO 5/00/93 EF 65% small pericardial effusion.  - Chronic NYHA III-IIIB - Mild volume overload on torsemide 40 bid. Add spiro 12.5 daily  - Labs today and next week   3. Chronic Hypoxic Respiratory Failure due to COPD and H/O Lung Cancer S/P LUL lobectomy  - On home oxygen. Continue - F/u with Dr. Melvyn Novas  4. Suspected Sleep Apnea  -  She has refused sleep study  5. Chronic dizziness - due to previous CVA +/- posterior circulation insufficiency - now improved  6. Severe PAD  - CT angiogram revealed near total occlusion of the innominate artery at the origin with extensive calcification of the arch. There is then is an area above the origin with mild narrowing of the innominate right at its bifurcation. There is a high-grade stenosis of the right common carotid and apparently mild stenosis of the right subclavian takeoff - s/p repair 06/19/19 with right carotid exposure with common carotid and innominate artery stenting as well as subclavian artery stenting via brachial approach by Dr. Carlis Abbott.  - stable currently   Rafael Bihari MD 8:17 AM

## 2021-03-22 ENCOUNTER — Telehealth (HOSPITAL_COMMUNITY): Payer: Self-pay

## 2021-03-22 ENCOUNTER — Ambulatory Visit (HOSPITAL_COMMUNITY)
Admission: RE | Admit: 2021-03-22 | Discharge: 2021-03-22 | Disposition: A | Discharge status: Home / Self Care | Payer: PPO | Source: Ambulatory Visit | Attending: Family Medicine | Admitting: Family Medicine

## 2021-03-22 ENCOUNTER — Encounter (HOSPITAL_COMMUNITY): Payer: Self-pay

## 2021-03-22 ENCOUNTER — Telehealth (HOSPITAL_COMMUNITY): Payer: Self-pay | Admitting: Licensed Clinical Social Worker

## 2021-03-22 ENCOUNTER — Other Ambulatory Visit: Payer: Self-pay

## 2021-03-22 ENCOUNTER — Encounter (HOSPITAL_COMMUNITY): Payer: PPO

## 2021-03-22 VITALS — BP 102/64 | HR 57 | Wt 171.2 lb

## 2021-03-22 DIAGNOSIS — R42 Dizziness and giddiness: Secondary | ICD-10-CM | POA: Diagnosis not present

## 2021-03-22 DIAGNOSIS — Z902 Acquired absence of lung [part of]: Secondary | ICD-10-CM | POA: Insufficient documentation

## 2021-03-22 DIAGNOSIS — I5042 Chronic combined systolic (congestive) and diastolic (congestive) heart failure: Secondary | ICD-10-CM

## 2021-03-22 DIAGNOSIS — I272 Pulmonary hypertension, unspecified: Secondary | ICD-10-CM | POA: Diagnosis not present

## 2021-03-22 DIAGNOSIS — Z95828 Presence of other vascular implants and grafts: Secondary | ICD-10-CM | POA: Diagnosis not present

## 2021-03-22 DIAGNOSIS — Z9981 Dependence on supplemental oxygen: Secondary | ICD-10-CM | POA: Insufficient documentation

## 2021-03-22 DIAGNOSIS — F419 Anxiety disorder, unspecified: Secondary | ICD-10-CM | POA: Diagnosis not present

## 2021-03-22 DIAGNOSIS — K219 Gastro-esophageal reflux disease without esophagitis: Secondary | ICD-10-CM | POA: Insufficient documentation

## 2021-03-22 DIAGNOSIS — Z79899 Other long term (current) drug therapy: Secondary | ICD-10-CM | POA: Insufficient documentation

## 2021-03-22 DIAGNOSIS — Z85118 Personal history of other malignant neoplasm of bronchus and lung: Secondary | ICD-10-CM | POA: Insufficient documentation

## 2021-03-22 DIAGNOSIS — Z9181 History of falling: Secondary | ICD-10-CM | POA: Insufficient documentation

## 2021-03-22 DIAGNOSIS — R29818 Other symptoms and signs involving the nervous system: Secondary | ICD-10-CM | POA: Diagnosis not present

## 2021-03-22 DIAGNOSIS — I69998 Other sequelae following unspecified cerebrovascular disease: Secondary | ICD-10-CM | POA: Diagnosis not present

## 2021-03-22 DIAGNOSIS — N179 Acute kidney failure, unspecified: Secondary | ICD-10-CM | POA: Insufficient documentation

## 2021-03-22 DIAGNOSIS — I771 Stricture of artery: Secondary | ICD-10-CM

## 2021-03-22 DIAGNOSIS — E785 Hyperlipidemia, unspecified: Secondary | ICD-10-CM | POA: Diagnosis not present

## 2021-03-22 DIAGNOSIS — J449 Chronic obstructive pulmonary disease, unspecified: Secondary | ICD-10-CM | POA: Diagnosis not present

## 2021-03-22 DIAGNOSIS — I110 Hypertensive heart disease with heart failure: Secondary | ICD-10-CM | POA: Diagnosis not present

## 2021-03-22 DIAGNOSIS — I779 Disorder of arteries and arterioles, unspecified: Secondary | ICD-10-CM | POA: Diagnosis not present

## 2021-03-22 DIAGNOSIS — I3139 Other pericardial effusion (noninflammatory): Secondary | ICD-10-CM | POA: Insufficient documentation

## 2021-03-22 DIAGNOSIS — E1151 Type 2 diabetes mellitus with diabetic peripheral angiopathy without gangrene: Secondary | ICD-10-CM | POA: Insufficient documentation

## 2021-03-22 DIAGNOSIS — I2721 Secondary pulmonary arterial hypertension: Secondary | ICD-10-CM

## 2021-03-22 DIAGNOSIS — I5032 Chronic diastolic (congestive) heart failure: Secondary | ICD-10-CM | POA: Diagnosis not present

## 2021-03-22 DIAGNOSIS — R0789 Other chest pain: Secondary | ICD-10-CM | POA: Diagnosis not present

## 2021-03-22 DIAGNOSIS — J9611 Chronic respiratory failure with hypoxia: Secondary | ICD-10-CM | POA: Diagnosis not present

## 2021-03-22 NOTE — Progress Notes (Signed)
ReDS Vest / Clip - 03/22/21 1500       ReDS Vest / Clip   Station Marker A    Ruler Value 28    ReDS Value Range Moderate volume overload    ReDS Actual Value 37

## 2021-03-22 NOTE — Telephone Encounter (Signed)
CSW received VM from pt stating that she wanted to cancel appt with Dr. Haroldine Laws this afternoon because she was just in the ED.  CSW called pt back to inform she would still be encouraged to come to her appt- she reports she already canceled and was contacted by staff to get her rescheduled with APP  Will continue to follow and assist as needed  Jorge Ny, [facility] Desk#: [phone] Cell#: [phone]

## 2021-03-22 NOTE — Progress Notes (Addendum)
ADVANCED HF CLINIC NOTE   Primary Cardiologist: Dr Gwenlyn Found Pulmonary: Dr Melvyn Novas  HF Cardiologist: Dr. Haroldine Laws  HPI  Caitlin Oliver is an 80 y.o. with history of COPD, DM, PAD, LCEA 2015, R CEA 2014, GERD, hyperlipidemia, anxiety, HTN,chronic respiratory failure on 2 liters at home, lung cancer, S/P L upper lobectomy 2017, and chronic diastolic heart failure.   Admitted 06/16/18 with difficulty walking with ataxia. Code Stroke called. R precentral gyrus 5 mm ischemic stroke noted. Discharged on oxygen. Discharged on asa and plavix. She was later discharged to SNF for an extended stay.   Admitted to [facility] on 11/13/18  with increased shortness of breath and chest pain. Diuresed with IV lasix and transitioned to lasix 40 mg twice a day. Had RHC/LHC with mod pulmonary htn and elevated filling pressures.  S/p repair 06/19/19 with right carotid exposure with common carotid and innominate artery stenting as well as subclavian artery stenting via brachial approach by Dr. Carlis Abbott.  Follow up 7/22 feeling good, stable chronic NYHA III-IIIb symptoms. Mildly volume up and spiro started.    PCP follow up 03/16/21 for worsening fatigue. Flu neg, started on abx for sinus infection. Seen in ED 03/20/21 for SOB and chest tightness. CXR reassuring, low concern for HF or COPD exacerbation. Labs showed mild AKI 2.37.   Today she returns for an acute visit for worsening fatigue and dyspnea. She is more fatigued and SOB x 4 weeks. She is now on 3L oxygen (previously on 2L). Gets SOB with ADLs. Has some chest tightness. Has had a few falls recently, last being a month ago. Hit her head, seen in ED, CT head ok. Continues with dizziness with position changes. Denies abnormal bleeding, palpitations, edema, or PND/Orthopnea. Appetite ok. No fever or chills. Weight at home 166 pounds. Taking all medications. Has not had metolazone in a long time.  - Echo (7/20): EF 65%  Moderate Asymmetric hypertrophy septal wall. RV mild reduced.  Small pericardial effusion.  - RHC (3/21): RA 9, PAP 50/15 (30), PCWP 17, PVR 2.5 WU, CO/CI 3.9/2.1   PFTs 11/20 FEV1 1.25 (62%) FVC 2.03 (75%) DLCO 51%  PFTs 8/17 FEV1 1.77 (84%) FVC 2.49 (88%) DLCO 69%  RHC/LHC  11/20/2018  RA 11 PAP 79/39 (52)  PCWP 31 PVR 4.9  CO 4.3  CI 2.9  Mild to moderate, non-obstructive coronary artery disease. Severe pulmonary hypertension. Moderately to severely elevated left and right heart filling pressures. Mildly reduced Fick cardiac output/index. Severe right brachiocephalic and subclavian artery stenoses with combined gradient of ~50 mmHg and possible reversal of flow in the right vertebral artery (subclavian steal).  Past Medical History:  Diagnosis Date   Anxiety    Arthritis    Cancer ([place])    lung cancer   Carotid artery disease (HCC)    Cataracts, bilateral    CHF (congestive heart failure) (HCC)    Claudication (HCC)    COPD (chronic obstructive pulmonary disease) ([place])    Diabetes mellitus    Dizzy    Family history of adverse reaction to anesthesia    Pt nephew has PONV   GERD (gastroesophageal reflux disease)    Headache    History of kidney stones    Hypercholesteremia    Hypertension    Peripheral arterial disease (HCC)    bilateral iliac artery stenosis by angiography   Pneumonia    Stomach ulcer    Stroke ([place])    Tobacco abuse    Wears glasses  Current Outpatient Medications  Medication Sig Dispense Refill   acetaminophen (TYLENOL) 500 MG tablet Take 500 mg by mouth every 6 (six) hours as needed for moderate pain.     albuterol (PROVENTIL HFA;VENTOLIN HFA) 108 (90 Base) MCG/ACT inhaler Inhale 2 puffs into the lungs every 6 (six) hours as needed for wheezing or shortness of breath.      Ascorbic Acid (VITAMIN C) 1000 MG tablet Take 1,000 mg by mouth daily.     Biotin 1000 MCG tablet Take 1,000 mcg by mouth daily.     Calcium Carb-Cholecalciferol (CALCIUM CARBONATE-VITAMIN D3 PO) Take 1 tablet by mouth daily  in the afternoon.     clidinium-chlordiazePOXIDE (LIBRAX) 5-2.5 MG capsule Take 1 capsule by mouth 2 (two) times daily.     clopidogrel (PLAVIX) 75 MG tablet Take 1 tablet (75 mg total) by mouth daily.     diazepam (VALIUM) 5 MG tablet Take 5 mg by mouth every 8 (eight) hours as needed for anxiety.     dicyclomine (BENTYL) 20 MG tablet Take 20 mg by mouth daily as needed for spasms.     docusate sodium (COLACE) 100 MG capsule Take 100 mg by mouth 2 (two) times daily as needed for mild constipation.     ferrous sulfate 325 (65 FE) MG tablet Take 325 mg by mouth daily with breakfast.     FLUoxetine (PROZAC) 20 MG capsule Take 20 mg by mouth daily.     gabapentin (NEURONTIN) 300 MG capsule Take 300 mg by mouth at bedtime.     HYDROcodone-acetaminophen (NORCO/VICODIN) 5-325 MG tablet Take 1 tablet by mouth every 6 (six) hours as needed for moderate pain. 15 tablet 0   Magnesium 250 MG TABS Take 250 mg by mouth daily.     metFORMIN (GLUCOPHAGE) 500 MG tablet Take by mouth 2 (two) times daily with a meal.     metolazone (ZAROXOLYN) 5 MG tablet Take 5 mg by mouth daily.     metoprolol tartrate (LOPRESSOR) 50 MG tablet Take 50 mg by mouth 2 (two) times daily.     naphazoline-glycerin (CLEAR EYES REDNESS) 0.012-0.25 % SOLN 1-2 drops 4 (four) times daily as needed for eye irritation.     Omega-3 Fatty Acids (FISH OIL) 1200 MG CAPS Take 1,200 mg by mouth daily.     ondansetron (ZOFRAN) 4 MG tablet Take 4 mg by mouth at bedtime as needed for nausea or vomiting.      ONE TOUCH ULTRA TEST test strip      OXYGEN Inhale into the lungs. 3 liters n/c     pantoprazole (PROTONIX) 40 MG tablet Take 40 mg by mouth 2 (two) times daily.      polyethylene glycol (MIRALAX / GLYCOLAX) 17 g packet Take 17 g by mouth daily as needed for mild constipation.     potassium chloride SA (KLOR-CON M) 20 MEQ tablet Take 20 mEq by mouth daily.     pravastatin (PRAVACHOL) 40 MG tablet Take 20 mg by mouth daily before supper.       torsemide (DEMADEX) 20 MG tablet Take 2 tablets (40 mg total) by mouth twice a day 360 tablet 0   traZODone (DESYREL) 50 MG tablet Take 50 mg by mouth at bedtime.     albuterol (PROVENTIL) (2.5 MG/3ML) 0.083% nebulizer solution Take 2.5 mg by nebulization in the morning and at bedtime.      No current facility-administered medications for this encounter.   Allergies  Allergen Reactions   Fentanyl Shortness  Of Breath   Social History   Socioeconomic History   Marital status: Widowed    Spouse name: Not on file   Number of children: Not on file   Years of education: Not on file   Highest education level: Not on file  Occupational History   Not on file  Tobacco Use   Smoking status: Former    Packs/day: 1.50    Years: 56.00    Pack years: 84.00    Types: Cigarettes    Quit date: 07/07/2013    Years since quitting: 7.7   Smokeless tobacco: Former    Quit date: 02/25/2013  Vaping Use   Vaping Use: Some days  Substance and Sexual Activity   Alcohol use: No   Drug use: No   Sexual activity: Never  Other Topics Concern   Not on file  Social History Narrative   Not on file   Social Determinants of Health   Financial Resource Strain: Not on file  Food Insecurity: Not on file  Transportation Needs: Not on file  Physical Activity: Not on file  Stress: Not on file  Social Connections: Not on file  Intimate Partner Violence: Not on file   Family History  Problem Relation Age of Onset   Stroke Mother    Hypertension Mother    Kidney failure Father    BP 102/64   Pulse (!) 57   Wt 77.7 kg   SpO2 100%   BMI 29.39 kg/m   Wt Readings from Last 3 Encounters:  03/22/21 77.7 kg  11/02/20 77.2 kg  01/12/20 76.7 kg   PHYSICAL EXAM: General:  NAD. No resp difficulty, arrived in [facility] on oxygen HEENT: Normal Neck: Supple. No JVD. Carotids 2+ bilat; no bruits. No lymphadenopathy or thryomegaly appreciated. Cor: PMI nondisplaced. Regular rate & rhythm. No rubs, gallops or  murmurs. Lungs: Clear, absent LLL Abdomen: Soft, nontender, nondistended. No hepatosplenomegaly. No bruits or masses. Good bowel sounds. Extremities: No cyanosis, clubbing, rash, edema Neuro: Alert & oriented x 3, cranial nerves grossly intact. Moves all 4 extremities w/o difficulty. Affect pleasant.  ECG: (personally reviewed from 03/21/21) sinus brady   ReDs: 37%  ASSESSMENT & PLAN: 1. Pulmonary HTN, Mixed presentation.  - she has mixed PAH/PVH. RHC (8/20)  RA 11,PAP 79/39 (52) ,PCWP 31, PVR 4.8, CO 4.3 , CI 2.9 - RHC (3/21): RA 9, PAP 50/15 (30), PCWP 17, PVR 2.5 WU, CO/CI 3.9/2.1 - suspect combination of WHO Group II and III  - PFTs with obstructive/restrictive picture - Refused sleep study. We discussed this again today. - Refuses Pulmonary Rehab due to distance to travel, we discussed this again today. - Not candidate for selective pulmonary artery vasodilators. - Continue wearing O2.  - Could consider RHC (see below)  2. Chronic diastolic HF - Echo 4/43/15 EF 65% small pericardial effusion.  - Chronic NYHA III-IIIb, current dyspnea sounds like her baseline and is likely multifactorial. She is not volume overloaded today on exam, ReDs 37%. - Continue torsemide 40 bid.  - Continue metoprolol 50 mg bid. Consider switching to bisoprolol w/ COPD. - Off spiro with elevated SCr. Will not add back today w/ recent AKI.  - Labs from ED visit reviewed, SCr 2.37, BUN 19, K 4.4 - Labs in 1 week. - I considered CPX, but I don't think she could physically complete test. - Update echo with worsening symptoms. May need to consider repeat RHC, though doubt it would change current plan.  3. Chronic Hypoxic Respiratory Failure  due to COPD and H/O Lung Cancer S/P LUL lobectomy  - On home oxygen. Continue. - Follow up with Dr. Melvyn Novas, has not seen him in awhile.  4. Suspected Sleep Apnea  - She has refused sleep study.  5. Chronic dizziness - Due to previous CVA +/- posterior circulation  insufficiency - Had fall, discussed fall precautions.  6. Severe PAD  - CT angiogram revealed near total occlusion of the innominate artery at the origin with extensive calcification of the arch. There is then is an area above the origin with mild narrowing of the innominate right at its bifurcation. There is a high-grade stenosis of the right common carotid and apparently mild stenosis of the right subclavian takeoff - s/p repair 06/19/19 with right carotid exposure with common carotid and innominate artery stenting as well as subclavian artery stenting via brachial approach by Dr. Carlis Abbott.  - stable currently  Update echo, follow up with Pulmonary. Follow up with Dr. Haroldine Laws in 3 months.  Rafael Bihari FNP 2:51 PM

## 2021-03-30 ENCOUNTER — Other Ambulatory Visit (HOSPITAL_COMMUNITY): Payer: PPO

## 2021-03-30 DIAGNOSIS — J449 Chronic obstructive pulmonary disease, unspecified: Secondary | ICD-10-CM | POA: Diagnosis not present

## 2021-03-30 DIAGNOSIS — E119 Type 2 diabetes mellitus without complications: Secondary | ICD-10-CM | POA: Diagnosis not present

## 2021-03-30 DIAGNOSIS — I5032 Chronic diastolic (congestive) heart failure: Secondary | ICD-10-CM | POA: Diagnosis not present

## 2021-03-30 DIAGNOSIS — K219 Gastro-esophageal reflux disease without esophagitis: Secondary | ICD-10-CM | POA: Diagnosis not present

## 2021-03-30 DIAGNOSIS — I272 Pulmonary hypertension, unspecified: Secondary | ICD-10-CM | POA: Diagnosis not present

## 2021-03-30 DIAGNOSIS — E785 Hyperlipidemia, unspecified: Secondary | ICD-10-CM | POA: Diagnosis not present

## 2021-03-30 DIAGNOSIS — I110 Hypertensive heart disease with heart failure: Secondary | ICD-10-CM | POA: Diagnosis not present

## 2021-03-30 DIAGNOSIS — Z23 Encounter for immunization: Secondary | ICD-10-CM | POA: Diagnosis not present

## 2021-03-30 DIAGNOSIS — I739 Peripheral vascular disease, unspecified: Secondary | ICD-10-CM | POA: Diagnosis not present

## 2021-03-30 DIAGNOSIS — F32A Depression, unspecified: Secondary | ICD-10-CM | POA: Diagnosis not present

## 2021-04-03 ENCOUNTER — Encounter: Payer: Self-pay | Admitting: Internal Medicine

## 2021-04-03 ENCOUNTER — Ambulatory Visit (INDEPENDENT_AMBULATORY_CARE_PROVIDER_SITE_OTHER): Payer: PPO | Admitting: Internal Medicine

## 2021-04-03 ENCOUNTER — Other Ambulatory Visit: Payer: Self-pay

## 2021-04-03 VITALS — BP 130/74 | HR 71 | Temp 98.4°F | Ht 64.0 in | Wt 165.0 lb

## 2021-04-03 DIAGNOSIS — J411 Mucopurulent chronic bronchitis: Secondary | ICD-10-CM | POA: Diagnosis not present

## 2021-04-03 DIAGNOSIS — I2729 Other secondary pulmonary hypertension: Secondary | ICD-10-CM

## 2021-04-03 DIAGNOSIS — J986 Disorders of diaphragm: Secondary | ICD-10-CM

## 2021-04-03 DIAGNOSIS — J9611 Chronic respiratory failure with hypoxia: Secondary | ICD-10-CM

## 2021-04-03 MED ORDER — REVEFENACIN 175 MCG/3ML IN SOLN: 175.0000 ug | Freq: Every day | RESPIRATORY_TRACT | 5 refills | Status: DC

## 2021-04-03 NOTE — Patient Instructions (Signed)
Please schedule follow up scheduled with myself in 4 months.  If my schedule is not open yet, we will contact you with a reminder closer to that time. Please call [phone] if you haven't heard from [place] a month before.   Before your next visit I would like you to have: Full set of PFTs - follow up with me afterwards.  Start taking yupelri nebulizer treatment once a day.   This is going to your mail order pharmacy.

## 2021-04-03 NOTE — Progress Notes (Signed)
Caitlin Oliver    [id number]    March 11, 1941  Primary Care Physician:Kaplan, Baldemar Friday., PA-C  Referring Physician: [name], PA-C [id number]  [place] [address] Reason for Consultation: shortness of breath Date of Consultation: 04/03/2021  Chief complaint:   Chief Complaint  Patient presents with   Consult    Pt states that she has had complaints of increased SOB that is worse than usual. States she cannot walk that far before she is SOB. Pt also has had a pain in the middle of her chest and also between her shoulders.     HPI: Caitlin Oliver is a 80 y.o. woman who presents for new patient evaluation for shortness of breath. She has been on oxygen for 3-4 years. She has severe peripheral arterial disease, history of CVA, COPD  She has Group 2-3 secondary pulmonary hypertension treated with diuretics.   She has a history of lung cancer s/p RUL lobectomy in 2017, no chemotherapy or radiation.   She notes worsening dyspnea on exertion over the last 6 weeks ago. 6 weeks ago she could perform ADLS like microwaving a meal, bathing herself. She lives independently.   She denies worsening cough, chest tightness, wheezing, le edema, fevers, orthopnea.   She has previously been on inhalers for COPD but didn't feel they helped her much. She is using an albuterol inhalers - she has taken nebulizers in the past but they hurt her chest when she used them.     Social history:  Exposures: lives at home independently Smoking history: She smoked for 56 years x 1.5 ppd =-85 pack years.   Social History   Occupational History   Not on file  Tobacco Use   Smoking status: Former    Packs/day: 1.50    Years: 56.00    Pack years: 84.00    Types: Cigarettes    Quit date: 07/07/2013    Years since quitting: 7.7   Smokeless tobacco: Former    Quit date: 02/25/2013  Vaping Use   Vaping Use: Some days  Substance and Sexual Activity   Alcohol use: No   Drug  use: No   Sexual activity: Never    Relevant family history:  Family History  Problem Relation Age of Onset   Stroke Mother    Hypertension Mother    Kidney failure Father    Lung disease Neg Hx     Past Medical History:  Diagnosis Date   Anxiety    Arthritis    Cancer ([place])    lung cancer   Carotid artery disease (HCC)    Cataracts, bilateral    CHF (congestive heart failure) (HCC)    Claudication (HCC)    COPD (chronic obstructive pulmonary disease) ([place])    Diabetes mellitus    Dizzy    Family history of adverse reaction to anesthesia    Pt nephew has PONV   GERD (gastroesophageal reflux disease)    Headache    History of kidney stones    Hypercholesteremia    Hypertension    Peripheral arterial disease (HCC)    bilateral iliac artery stenosis by angiography   Pneumonia    Stomach ulcer    Stroke ([place])    Tobacco abuse    Wears glasses     Past Surgical History:  Procedure Laterality Date   ABDOMINAL AORTIC ENDOVASCULAR STENT GRAFT Right 06/19/2019   Procedure: INNOMINATE STENT, RIGHT CAROTID STENT, RIGHT SUBCLAVIAN  STENT AND CAROTID CUTDOWN;  Surgeon: Marty Heck, MD;  Location: [place];  Service: Vascular;  Laterality: Right;   ABDOMINAL HYSTERECTOMY     APPENDECTOMY     BREAST REDUCTION SURGERY     CAROTID ANGIOGRAM N/A 02/25/2013   Procedure: CAROTID ANGIOGRAM;  Surgeon: Lorretta Harp, MD;  Location: [facility] CATH LAB;  Service: Cardiovascular;  Laterality: N/A;   ENDARTERECTOMY Right 03/06/2013   Procedure: ENDARTERECTOMY CAROTID-RIGHT;  Surgeon: Serafina Mitchell, MD;  Location: [place];  Service: Vascular;  Laterality: Right;   ENDARTERECTOMY Left 05/07/2013   Procedure: LEFT CAROTID ARTERY ENDARTERECTOMY WITH VASCU-GUARD PATCH ANGIOPLASTY ;  Surgeon: Serafina Mitchell, MD;  Location: [place];  Service: Vascular;  Laterality: Left;   INTRAOPERATIVE ARTERIOGRAM N/A 06/19/2019   Procedure: Clydie Braun;  Surgeon: Marty Heck, MD;  Location: [place];   Service: Vascular;  Laterality: N/A;   IR ANGIO INTRA EXTRACRAN SEL COM CAROTID INNOMINATE UNI R MOD SED  05/14/2019   IR ANGIO VERTEBRAL SEL SUBCLAVIAN INNOMINATE BILAT MOD SED  05/14/2019   LOBECTOMY Left 02/03/2016   Procedure: LEFT UPPER LOBECTOMY;  Surgeon: [name], MD;  Location: [place];  Service: Thoracic;  Laterality: Left;   LOWER EXTREMITY ANGIOGRAM N/A 02/25/2013   Procedure: LOWER EXTREMITY ANGIOGRAM;  Surgeon: Lorretta Harp, MD;  Location: [facility] CATH LAB;  Service: Cardiovascular;  Laterality: N/A;   LOWER EXTREMITY ANGIOGRAM N/A 07/27/2013   Procedure: LOWER EXTREMITY ANGIOGRAM;  Surgeon: Lorretta Harp, MD;  Location: [facility] CATH LAB;  Service: Cardiovascular;  Laterality: N/A;   PATCH ANGIOPLASTY Right 03/06/2013   Procedure: PATCH ANGIOPLASTY of Right Carotid Artery using Vascu-Guard Patch;  Surgeon: Serafina Mitchell, MD;  Location: [place];  Service: Vascular;  Laterality: Right;   PERIPHERAL VASCULAR CATHETERIZATION N/A 11/15/2015   Procedure: Aortic Arch Angiography;  Surgeon: Serafina Mitchell, MD;  Location: [place] CV LAB;  Service: Cardiovascular;  Laterality: N/A;   PERIPHERAL VASCULAR CATHETERIZATION Bilateral 11/15/2015   Procedure: Carotid Angiography;  Surgeon: Serafina Mitchell, MD;  Location: [place] CV LAB;  Service: Cardiovascular;  Laterality: Bilateral;   PERIPHERAL VASCULAR CATHETERIZATION Left 11/15/2015   Procedure: Upper Extremity Angiography;  Surgeon: Serafina Mitchell, MD;  Location: [place] CV LAB;  Service: Cardiovascular;  Laterality: Left;   PERIPHERAL VASCULAR CATHETERIZATION Left 11/15/2015   Procedure: Peripheral Vascular Intervention;  Surgeon: Serafina Mitchell, MD;  Location: [place] CV LAB;  Service: Cardiovascular;  Laterality: Left;  subclavian    RIGHT HEART CATH N/A 06/16/2019   Procedure: RIGHT HEART CATH;  Surgeon: Jolaine Artist, MD;  Location: [place] CV LAB;  Service: Cardiovascular;  Laterality: N/A;   RIGHT/LEFT HEART  CATH AND CORONARY ANGIOGRAPHY N/A 11/20/2018   Procedure: RIGHT/LEFT HEART CATH AND CORONARY ANGIOGRAPHY;  Surgeon: Nelva Bush, MD;  Location: [place] CV LAB;  Service: Cardiovascular;  Laterality: N/A;   SALIVARY GLAND SURGERY     scar tissue removed from left saliva glad   TUBAL LIGATION     ULTRASOUND GUIDANCE FOR VASCULAR ACCESS Right 06/19/2019   Procedure: Ultrasound Guidance For Vascular Access;  Surgeon: Marty Heck, MD;  Location: [place];  Service: Vascular;  Laterality: Right;   VIDEO ASSISTED THORACOSCOPY (VATS)/WEDGE RESECTION Left 02/03/2016   Procedure: VIDEO ASSISTED THORACOSCOPY;  Surgeon: [name], MD;  Location: [place];  Service: Thoracic;  Laterality: Left;     Physical Exam: Blood pressure 130/74, pulse 71, temperature 98.4 F (36.9 C), temperature  source Oral, height _0  (1.626 m), weight 165 lb (74.8 kg), SpO2 96 %. Gen:      No acute distress, chronically ill appearing, no distress ENT:  no nasal polyps, mucus membranes moist Lungs:    No increased respiratory effort, symmetric chest wall excursion, clear to auscultation bilaterally, diminished right lung base CV:         Regular rate and rhythm; no murmurs, rubs, or gallops.  No pedal edema Abd:      + bowel sounds; soft, non-tender; no distension MSK: no acute synovitis of DIP or PIP joints, no mechanics hands.  Skin:      Warm and dry; no rashes Neuro: normal speech, no focal facial asymmetry Psych: alert and oriented x3, normal mood and affect   Data Reviewed/Medical Decision Making:  Independent interpretation of tests: Imaging:  Review of patient's chest xrays Dec 2022 images revealed elevated right hemidiaphgram, low lung volumes, chronic bronchitis changes. The patient's images have been independently reviewed by me.    Right heart catheterization March 2021 Findings:   RA = 9 RV = 54/10 PA =  50/15 (30) PCW = 17 Fick cardiac output/index = 5.1/2.8 Thermo CO/CI =  3.9/2.1 PVR = 2.5 WU (Fick) 3.3 WU (Thermo) FA sat = 99% PA sat = 69%, 66%   Assessment: 1. Mild PAH  2. Markedly improved left-sided pressures  PFTs: I have personally reviewed the patient's PFTs and shows COPD FEV1 61% of predicted PFT Results Latest Ref Rng & Units 02/19/2019 12/15/2015  FVC-Pre L 2.03 2.49  FVC-Predicted Pre % 75 88  FVC-Post L - 2.69  FVC-Predicted Post % - 95  Pre FEV1/FVC % % 62 71  Post FEV1/FCV % % - 72  FEV1-Pre L 1.25 1.77  FEV1-Predicted Pre % 62 84  FEV1-Post L - 1.92  DLCO uncorrected ml/min/mmHg 9.75 16.88  DLCO UNC% % 51 69  DLVA Predicted % 63 80  TLC L 5.65 5.76  TLC % Predicted % 111 114  RV % Predicted % 158 144    Labs:  Lab Results  Component Value Date   WBC 11.0 (H) 03/20/2021   HGB 12.1 03/20/2021   HCT 38.1 03/20/2021   MCV 94.8 03/20/2021   PLT 263 03/20/2021   Lab Results  Component Value Date   NA 133 (L) 03/20/2021   K 4.4 03/20/2021   CL 91 (L) 03/20/2021   CO2 32 03/20/2021     Immunization status:  Immunization History  Administered Date(s) Administered   Influenza, High Dose Seasonal PF 01/17/2016, 01/28/2017, 01/16/2018, 01/30/2019, 03/08/2020, 01/09/2021   Influenza-Unspecified 01/31/2016   PFIZER Comirnaty(Gray Top)Covid-19 Tri-Sucrose Vaccine 10/12/2020   PFIZER(Purple Top)SARS-COV-2 Vaccination 10/08/2019, 10/28/2019   Pfizer Covid-19 Vaccine Bivalent Booster 38yr & up 03/30/2021   Pneumococcal Conjugate-13 02/09/2015   Pneumococcal Polysaccharide-23 05/08/2013   Zoster Recombinat (Shingrix) 10/21/2017   Zoster, Live 05/14/2011     I reviewed prior external note(s) from cardiology, heart failure, ED, vascular surgery  I reviewed the result(s) of the labs and imaging as noted above.   I have ordered pft.  Discussion of management or test interpretation with another colleague.  Assessment:  Moderate COPD FEV1 62% of predicted, likely worsening symptoms History of tobacco use disorder Chronic  respiratory failure on home oxygen Worsening shortness of breath Elevated right hemidiaphragm concerning for paresis/paralysis Group 2-3 pulmonary hypertension - appears euvolemic.  Goals of Care/Advanced Care Planning  Plan/Recommendations: No clear precipitant for her worsening dyspnea. Not consistent with PE or  heart failure. I worry for worsening COPD/deconditioning. Cannot exclude restriction from diaphragmatic paresis/paralysis.  Would proceed with PFTs to better elucidate.   She is willing to try nebulizer treatments again - will trial yupelri. She already has a machine at home.   We discussed disease management and progression at length today. I discussed the progressive nature of COPD and her worsening symptoms. In the event she becomes ill and has a respiratory arrest or a cardiac arrest, she would not want to be sustained on mechanical ventilation and would not want to undergo aggressive heroic measures such a and CPR - She is a DO NOT RESUSCITATE. I have spent a total of 17 minutes discussing this with her in face to face and non face to face care.   She says she has talked to her niece kim about this, who is here with her today.    Return to Care: Return in about 4 weeks (around 05/01/2021).  Lenice Llamas, MD Pulmonary and [place]  CC: [name], PA-C

## 2021-04-04 ENCOUNTER — Telehealth: Payer: Self-pay

## 2021-04-04 ENCOUNTER — Other Ambulatory Visit (HOSPITAL_COMMUNITY): Payer: Self-pay

## 2021-04-04 NOTE — Telephone Encounter (Signed)
Patient Advocate Encounter   Received notification from [facility] that prior authorization for Yupelri 131m/3ml solution is required by his/her insurance [employer].   PA submitted on 04/04/21  Key#: [id number]  Status is pending    [facility]:  Patient Advocate Fax:  [phone]

## 2021-04-05 ENCOUNTER — Other Ambulatory Visit (HOSPITAL_COMMUNITY): Payer: Self-pay

## 2021-04-05 NOTE — Telephone Encounter (Addendum)
Patient Advocate Encounter  Received notification from [employer] that the request for prior authorization for Maretta Bees has been denied due to the medication being approved under Medicare Part A/B.    PA Case ID: [id number]

## 2021-04-05 NOTE — Telephone Encounter (Signed)
Called and spoke with pt letting her know info from pharmacy team and she verbalized understanding. Nothing further needed.

## 2021-04-06 ENCOUNTER — Telehealth: Payer: Self-pay | Admitting: Internal Medicine

## 2021-04-06 NOTE — Telephone Encounter (Signed)
I have called the pharmacy and LM on VM for them to be able to refill the yupleri for the 90 day supply with 1 refill.

## 2021-04-25 NOTE — Telephone Encounter (Signed)
ATC patient at [phone] line just rang and rang no answer, will try to call back later

## 2021-04-27 NOTE — Telephone Encounter (Signed)
Called the pt and there was no answer- line rings many times and then fast busy signal. Will close encounter.

## 2021-05-05 ENCOUNTER — Emergency Department (HOSPITAL_COMMUNITY): Payer: PPO

## 2021-05-05 ENCOUNTER — Other Ambulatory Visit: Payer: Self-pay

## 2021-05-05 ENCOUNTER — Emergency Department (HOSPITAL_COMMUNITY)
Admission: EM | Admit: 2021-05-05 | Discharge: 2021-05-06 | Disposition: A | Discharge status: Home / Self Care | Payer: PPO | Attending: Emergency Medicine | Admitting: Emergency Medicine

## 2021-05-05 DIAGNOSIS — E1169 Type 2 diabetes mellitus with other specified complication: Secondary | ICD-10-CM | POA: Insufficient documentation

## 2021-05-05 DIAGNOSIS — Y9301 Activity, walking, marching and hiking: Secondary | ICD-10-CM | POA: Diagnosis not present

## 2021-05-05 DIAGNOSIS — Z79899 Other long term (current) drug therapy: Secondary | ICD-10-CM | POA: Insufficient documentation

## 2021-05-05 DIAGNOSIS — Z7902 Long term (current) use of antithrombotics/antiplatelets: Secondary | ICD-10-CM | POA: Diagnosis not present

## 2021-05-05 DIAGNOSIS — R079 Chest pain, unspecified: Secondary | ICD-10-CM | POA: Insufficient documentation

## 2021-05-05 DIAGNOSIS — S0101XA Laceration without foreign body of scalp, initial encounter: Secondary | ICD-10-CM | POA: Diagnosis not present

## 2021-05-05 DIAGNOSIS — Z8673 Personal history of transient ischemic attack (TIA), and cerebral infarction without residual deficits: Secondary | ICD-10-CM | POA: Insufficient documentation

## 2021-05-05 DIAGNOSIS — Y9289 Other specified places as the place of occurrence of the external cause: Secondary | ICD-10-CM | POA: Insufficient documentation

## 2021-05-05 DIAGNOSIS — Z23 Encounter for immunization: Secondary | ICD-10-CM | POA: Insufficient documentation

## 2021-05-05 DIAGNOSIS — W19XXXA Unspecified fall, initial encounter: Secondary | ICD-10-CM

## 2021-05-05 DIAGNOSIS — R0602 Shortness of breath: Secondary | ICD-10-CM | POA: Diagnosis not present

## 2021-05-05 DIAGNOSIS — Z20822 Contact with and (suspected) exposure to covid-19: Secondary | ICD-10-CM | POA: Insufficient documentation

## 2021-05-05 DIAGNOSIS — Z7984 Long term (current) use of oral hypoglycemic drugs: Secondary | ICD-10-CM | POA: Insufficient documentation

## 2021-05-05 DIAGNOSIS — I509 Heart failure, unspecified: Secondary | ICD-10-CM | POA: Insufficient documentation

## 2021-05-05 DIAGNOSIS — I2510 Atherosclerotic heart disease of native coronary artery without angina pectoris: Secondary | ICD-10-CM | POA: Insufficient documentation

## 2021-05-05 DIAGNOSIS — W01198A Fall on same level from slipping, tripping and stumbling with subsequent striking against other object, initial encounter: Secondary | ICD-10-CM | POA: Diagnosis not present

## 2021-05-05 DIAGNOSIS — J449 Chronic obstructive pulmonary disease, unspecified: Secondary | ICD-10-CM | POA: Insufficient documentation

## 2021-05-05 DIAGNOSIS — I110 Hypertensive heart disease with heart failure: Secondary | ICD-10-CM | POA: Diagnosis not present

## 2021-05-05 DIAGNOSIS — S0990XA Unspecified injury of head, initial encounter: Secondary | ICD-10-CM | POA: Diagnosis present

## 2021-05-05 LAB — COMPREHENSIVE METABOLIC PANEL
ALT: 15 U/L (ref 0–44)
AST: 21 U/L (ref 15–41)
Albumin: 3.6 g/dL (ref 3.5–5.0)
Alkaline Phosphatase: 57 U/L (ref 38–126)
Anion gap: 11 (ref 5–15)
BUN: 21 mg/dL (ref 8–23)
CO2: 31 mmol/L (ref 22–32)
Calcium: 9 mg/dL (ref 8.9–10.3)
Chloride: 98 mmol/L (ref 98–111)
Creatinine, Ser: 1.8 mg/dL — ABNORMAL HIGH (ref 0.44–1.00)
GFR, Estimated: 28 mL/min — ABNORMAL LOW (ref >60)
Glucose, Bld: 182 mg/dL — ABNORMAL HIGH (ref 70–99)
Potassium: 3.9 mmol/L (ref 3.5–5.1)
Sodium: 140 mmol/L (ref 135–145)
Total Bilirubin: 0.3 mg/dL (ref 0.3–1.2)
Total Protein: 6 g/dL — ABNORMAL LOW (ref 6.5–8.1)

## 2021-05-05 LAB — TROPONIN I (HIGH SENSITIVITY)
Troponin I (High Sensitivity): 10 ng/L (ref <18)
Troponin I (High Sensitivity): 9 ng/L (ref <18)

## 2021-05-05 LAB — CBC
HCT: 37.3 % (ref 36.0–46.0)
Hemoglobin: 12 g/dL (ref 12.0–15.0)
MCH: 30.2 pg (ref 26.0–34.0)
MCHC: 32.2 g/dL (ref 30.0–36.0)
MCV: 94 fL (ref 80.0–100.0)
Platelets: 235 10*3/uL (ref 150–400)
RBC: 3.97 MIL/uL (ref 3.87–5.11)
RDW: 12.4 % (ref 11.5–15.5)
WBC: 7 10*3/uL (ref 4.0–10.5)
nRBC: 0 % (ref 0.0–0.2)

## 2021-05-05 LAB — SAMPLE TO BLOOD BANK

## 2021-05-05 LAB — PROTIME-INR
INR: 1 (ref 0.8–1.2)
Prothrombin Time: 13.5 seconds (ref 11.4–15.2)

## 2021-05-05 LAB — ETHANOL: Alcohol, Ethyl (B): <10 mg/dL (ref <10)

## 2021-05-05 LAB — RESP PANEL BY RT-PCR (FLU A&B, COVID) ARPGX2
Influenza A by PCR: NEGATIVE
Influenza B by PCR: NEGATIVE
SARS Coronavirus 2 by RT PCR: NEGATIVE

## 2021-05-05 LAB — LACTIC ACID, PLASMA: Lactic Acid, Venous: 2 mmol/L (ref 0.5–1.9)

## 2021-05-05 LAB — BRAIN NATRIURETIC PEPTIDE: B Natriuretic Peptide: 137.8 pg/mL — ABNORMAL HIGH (ref 0.0–100.0)

## 2021-05-05 MED ORDER — TETANUS-DIPHTH-ACELL PERTUSSIS 5-2.5-18.5 LF-MCG/0.5 IM SUSY
0.5000 mL | PREFILLED_SYRINGE | Freq: Once | INTRAMUSCULAR | Status: AC
  Administered 2021-05-05: 0.5 mL via INTRAMUSCULAR
  Filled 2021-05-05: qty 0.5

## 2021-05-05 MED ORDER — LIDOCAINE-EPINEPHRINE-TETRACAINE (LET) TOPICAL GEL
3.0000 mL | Freq: Once | TOPICAL | Status: AC
  Administered 2021-05-05: 3 mL via TOPICAL
  Filled 2021-05-05: qty 3

## 2021-05-05 NOTE — Progress Notes (Signed)
Orthopedic Tech Progress Note Patient Details:  ARNIE MAIOLO 01-05-1941 [id number] Level 2 trauma Patient ID: [name], female   DOB: Oct 07, 1940, 81 y.o.   MRN: [id number]  Ellouise Newer 05/05/2021, 8:39 PM

## 2021-05-05 NOTE — ED Notes (Signed)
Trauma Response Nurse Documentation   Caitlin Oliver is a 81 y.o. female arriving to [facility] ED via EMS  On clopidogrel 75 mg daily. Trauma was activated as a Level 2 by ED charge RN based on the following trauma criteria Elderly patients > 65 with head trauma on anti-coagulation (excluding ASA). Trauma team at the bedside on patient arrival. Patient cleared for CT by Dr. Sherry Ruffing EDP. Patient to CT with team. GCS 15.  History   Past Medical History:  Diagnosis Date   Anxiety    Arthritis    Cancer ([place])    lung cancer   Carotid artery disease (HCC)    Cataracts, bilateral    CHF (congestive heart failure) (HCC)    Claudication (HCC)    COPD (chronic obstructive pulmonary disease) ([place])    Diabetes mellitus    Dizzy    Family history of adverse reaction to anesthesia    Pt nephew has PONV   GERD (gastroesophageal reflux disease)    Headache    History of kidney stones    Hypercholesteremia    Hypertension    Peripheral arterial disease (HCC)    bilateral iliac artery stenosis by angiography   Pneumonia    Stomach ulcer    Stroke ([place])    Tobacco abuse    Wears glasses      Past Surgical History:  Procedure Laterality Date   ABDOMINAL AORTIC ENDOVASCULAR STENT GRAFT Right 06/19/2019   Procedure: INNOMINATE STENT, RIGHT CAROTID STENT, RIGHT SUBCLAVIAN STENT AND CAROTID CUTDOWN;  Surgeon: Marty Heck, MD;  Location: [place];  Service: Vascular;  Laterality: Right;   ABDOMINAL HYSTERECTOMY     APPENDECTOMY     BREAST REDUCTION SURGERY     CAROTID ANGIOGRAM N/A 02/25/2013   Procedure: CAROTID ANGIOGRAM;  Surgeon: Lorretta Harp, MD;  Location: [facility] CATH LAB;  Service: Cardiovascular;  Laterality: N/A;   ENDARTERECTOMY Right 03/06/2013   Procedure: ENDARTERECTOMY CAROTID-RIGHT;  Surgeon: Serafina Mitchell, MD;  Location: [place];  Service: Vascular;  Laterality: Right;   ENDARTERECTOMY Left 05/07/2013   Procedure: LEFT CAROTID ARTERY ENDARTERECTOMY WITH VASCU-GUARD PATCH  ANGIOPLASTY ;  Surgeon: Serafina Mitchell, MD;  Location: [place];  Service: Vascular;  Laterality: Left;   INTRAOPERATIVE ARTERIOGRAM N/A 06/19/2019   Procedure: Clydie Braun;  Surgeon: Marty Heck, MD;  Location: [place];  Service: Vascular;  Laterality: N/A;   IR ANGIO INTRA EXTRACRAN SEL COM CAROTID INNOMINATE UNI R MOD SED  05/14/2019   IR ANGIO VERTEBRAL SEL SUBCLAVIAN INNOMINATE BILAT MOD SED  05/14/2019   LOBECTOMY Left 02/03/2016   Procedure: LEFT UPPER LOBECTOMY;  Surgeon: [name], MD;  Location: [place];  Service: Thoracic;  Laterality: Left;   LOWER EXTREMITY ANGIOGRAM N/A 02/25/2013   Procedure: LOWER EXTREMITY ANGIOGRAM;  Surgeon: Lorretta Harp, MD;  Location: [facility] CATH LAB;  Service: Cardiovascular;  Laterality: N/A;   LOWER EXTREMITY ANGIOGRAM N/A 07/27/2013   Procedure: LOWER EXTREMITY ANGIOGRAM;  Surgeon: Lorretta Harp, MD;  Location: [facility] CATH LAB;  Service: Cardiovascular;  Laterality: N/A;   PATCH ANGIOPLASTY Right 03/06/2013   Procedure: PATCH ANGIOPLASTY of Right Carotid Artery using Vascu-Guard Patch;  Surgeon: Serafina Mitchell, MD;  Location: [place];  Service: Vascular;  Laterality: Right;   PERIPHERAL VASCULAR CATHETERIZATION N/A 11/15/2015   Procedure: Aortic Arch Angiography;  Surgeon: Serafina Mitchell, MD;  Location: [place] CV LAB;  Service: Cardiovascular;  Laterality: N/A;   PERIPHERAL VASCULAR CATHETERIZATION Bilateral 11/15/2015  Procedure: Carotid Angiography;  Surgeon: Serafina Mitchell, MD;  Location: [place] CV LAB;  Service: Cardiovascular;  Laterality: Bilateral;   PERIPHERAL VASCULAR CATHETERIZATION Left 11/15/2015   Procedure: Upper Extremity Angiography;  Surgeon: Serafina Mitchell, MD;  Location: [place] CV LAB;  Service: Cardiovascular;  Laterality: Left;   PERIPHERAL VASCULAR CATHETERIZATION Left 11/15/2015   Procedure: Peripheral Vascular Intervention;  Surgeon: Serafina Mitchell, MD;  Location: [place] CV LAB;  Service: Cardiovascular;   Laterality: Left;  subclavian    RIGHT HEART CATH N/A 06/16/2019   Procedure: RIGHT HEART CATH;  Surgeon: Jolaine Artist, MD;  Location: [place] CV LAB;  Service: Cardiovascular;  Laterality: N/A;   RIGHT/LEFT HEART CATH AND CORONARY ANGIOGRAPHY N/A 11/20/2018   Procedure: RIGHT/LEFT HEART CATH AND CORONARY ANGIOGRAPHY;  Surgeon: Nelva Bush, MD;  Location: [place] CV LAB;  Service: Cardiovascular;  Laterality: N/A;   SALIVARY GLAND SURGERY     scar tissue removed from left saliva glad   TUBAL LIGATION     ULTRASOUND GUIDANCE FOR VASCULAR ACCESS Right 06/19/2019   Procedure: Ultrasound Guidance For Vascular Access;  Surgeon: Marty Heck, MD;  Location: [place];  Service: Vascular;  Laterality: Right;   VIDEO ASSISTED THORACOSCOPY (VATS)/WEDGE RESECTION Left 02/03/2016   Procedure: VIDEO ASSISTED THORACOSCOPY;  Surgeon: [name], MD;  Location: [place];  Service: Thoracic;  Laterality: Left;       Initial Focused Assessment (If applicable, or please see trauma documentation): Laceration to back of head  CT's Completed:   CT Head and CT C-Spine   Interventions:  TDAP Portable Chest XRAY Trauma lab draw CTs as above  Plan for disposition:  Pending imaging/blood work  Medical illustrator completed:  none at the time of this note.  Event Summary: Patient arrives via EMS from home after a mechanical fall while walking, hitting her head on her walker. Hx stroke with residual L sided weakness. Denies substance abuse this evening. No LOC. C/o pain at wound site. Plavix daily, bleeding controlled. Portable Chest XRAY, escorted to CT by TRN. Asking for pain meds - secure chat sent to EDP pending orders. Dispo pending results.  Patient also complaining of chest pain - ongoing issue. Anticipate cardiac workup.  MTP Summary (If applicable): NA  Bedside handoff with ED RN Loma Sousa.    Renise Gillies O Ruben Mahler  Trauma Response RN  Please call TRN at [phone]  for further assistance.

## 2021-05-05 NOTE — ED Triage Notes (Signed)
Pt arrived via [facility] from home. Per EMS, pt states she fell after losing balance while walking with her walker causing her to hit her head on the walker. Pt denied LOC but does c/o dizziness since fall occurred. Pt also c/o pain where she hit her head. Bleeding from back of head had stopped prior to arrival to ED. No other obvious injury. Pt is able to move all extremities equally. Pt on home O2 at 3L at baseline. Pt caox4 on arrival to ED.

## 2021-05-05 NOTE — ED Provider Notes (Signed)
[facility] EMERGENCY DEPARTMENT Provider Note   CSN: [id number] Arrival date & time: 05/05/21  2004     History  Chief Complaint  Patient presents with   Lytle Michaels    Caitlin Oliver is a 81 y.o. female.  The history is provided by the patient, medical records and the EMS personnel. No language interpreter was used.  Fall This is a new problem. The current episode started less than 1 hour ago. The problem occurs rarely. The problem has not changed since onset.Associated symptoms include chest pain (chronically) and headaches. Pertinent negatives include no abdominal pain and no shortness of breath. Nothing aggravates the symptoms. Nothing relieves the symptoms. She has tried nothing for the symptoms. The treatment provided no relief.      Home Medications Prior to Admission medications   Medication Sig Start Date End Date Taking? Authorizing Provider  acetaminophen (TYLENOL) 500 MG tablet Take 500 mg by mouth every 6 (six) hours as needed for moderate pain.    [provider]  albuterol (PROVENTIL HFA;VENTOLIN HFA) 108 (90 Base) MCG/ACT inhaler Inhale 2 puffs into the lungs every 6 (six) hours as needed for wheezing or shortness of breath.  07/11/17   [provider]  albuterol (PROVENTIL) (2.5 MG/3ML) 0.083% nebulizer solution Take 2.5 mg by nebulization in the morning and at bedtime.  Patient not taking: Reported on 04/03/2021    [provider]  Ascorbic Acid (VITAMIN C) 1000 MG tablet Take 1,000 mg by mouth daily.    [provider]  Biotin 1000 MCG tablet Take 1,000 mcg by mouth daily.    [provider]  clidinium-chlordiazePOXIDE (LIBRAX) 5-2.5 MG capsule Take 1 capsule by mouth 2 (two) times daily.    [provider]  clopidogrel (PLAVIX) 75 MG tablet Take 1 tablet (75 mg total) by mouth daily. 06/20/18   Thurnell Lose, MD  diazepam (VALIUM) 5 MG tablet Take 5 mg by mouth every 8 (eight) hours as needed for  anxiety. 09/08/19   [provider]  dicyclomine (BENTYL) 20 MG tablet Take 20 mg by mouth daily as needed for spasms.    [provider]  docusate sodium (COLACE) 100 MG capsule Take 100 mg by mouth 2 (two) times daily as needed for mild constipation.    [provider]  ferrous sulfate 325 (65 FE) MG tablet Take 325 mg by mouth daily with breakfast.    [provider]  FLUoxetine (PROZAC) 20 MG capsule Take 20 mg by mouth daily.    [provider]  gabapentin (NEURONTIN) 300 MG capsule Take 300 mg by mouth at bedtime.    [provider]  HYDROcodone-acetaminophen (NORCO/VICODIN) 5-325 MG tablet Take 1 tablet by mouth every 6 (six) hours as needed for moderate pain. 06/21/19   Dagoberto Ligas, PA-C  Magnesium 250 MG TABS Take 250 mg by mouth daily.    [provider]  metFORMIN (GLUCOPHAGE) 500 MG tablet Take by mouth 2 (two) times daily with a meal.    [provider]  metolazone (ZAROXOLYN) 5 MG tablet Take 5 mg by mouth as needed.    [provider]  metoprolol tartrate (LOPRESSOR) 50 MG tablet Take 50 mg by mouth 2 (two) times daily.    [provider]  naphazoline-glycerin (CLEAR EYES REDNESS) 0.012-0.25 % SOLN 1-2 drops 4 (four) times daily as needed for eye irritation.    [provider]  ondansetron (ZOFRAN) 4 MG tablet Take 4 mg by mouth  at bedtime as needed for nausea or vomiting.  06/05/18   [provider]  ONE TOUCH ULTRA TEST test strip  09/12/16   [provider]  OXYGEN Inhale into the lungs. 3 liters n/c    [provider]  pantoprazole (PROTONIX) 40 MG tablet Take 40 mg by mouth 2 (two) times daily.  12/17/13   [provider]  polyethylene glycol (MIRALAX / GLYCOLAX) 17 g packet Take 17 g by mouth daily as needed for mild constipation. 12/02/18   [provider]  potassium chloride SA (KLOR-CON M) 20 MEQ tablet Take 20 mEq by mouth daily.     [provider]  pravastatin (PRAVACHOL) 40 MG tablet Take 20 mg by mouth daily before supper.     [provider]  revefenacin (YUPELRI) 175 MCG/3ML nebulizer solution Take 3 mLs (175 mcg total) by nebulization daily. 04/03/21   Spero Geralds, MD  torsemide (DEMADEX) 20 MG tablet Take 2 tablets (40 mg total) by mouth twice a day 01/06/21   Bensimhon, Shaune Pascal, MD  traZODone (DESYREL) 50 MG tablet Take 50 mg by mouth at bedtime.    [provider]      Allergies    Fentanyl    Review of Systems   Review of Systems  Constitutional:  Negative for chills, diaphoresis, fatigue and fever.  HENT:  Negative for congestion.   Respiratory:  Negative for cough, choking, chest tightness, shortness of breath and wheezing.   Cardiovascular:  Positive for chest pain (chronically). Negative for palpitations.  Gastrointestinal:  Negative for abdominal pain, constipation, diarrhea and nausea.  Genitourinary:  Negative for dysuria and flank pain.  Musculoskeletal:  Negative for back pain, neck pain and neck stiffness.  Skin:  Positive for wound. Negative for rash.  Neurological:  Positive for headaches. Negative for weakness, light-headedness and numbness.  Psychiatric/Behavioral:  Negative for agitation.   All other systems reviewed and are negative.  Physical Exam Updated Vital Signs BP (!) 119/56    Pulse (!) 50    Temp 98.1 F (36.7 C) (Oral)    Resp 17    Ht _0  (1.626 m)    Wt 73.5 kg    SpO2 100%    BMI 27.81 kg/m  Physical Exam Vitals and nursing note reviewed.  Constitutional:      General: She is not in acute distress.    Appearance: She is well-developed. She is not ill-appearing, toxic-appearing or diaphoretic.  HENT:     Head: Laceration present.      Nose: No congestion or rhinorrhea.     Mouth/Throat:     Mouth: Mucous membranes are moist.  Eyes:     Conjunctiva/sclera: Conjunctivae normal.     Pupils: Pupils are equal, round, and reactive to  light.  Cardiovascular:     Rate and Rhythm: Normal rate and regular rhythm.     Heart sounds: No murmur heard. Pulmonary:     Effort: Pulmonary effort is normal. No respiratory distress.     Breath sounds: Normal breath sounds. No wheezing, rhonchi or rales.  Chest:     Chest wall: No tenderness.  Abdominal:     General: Abdomen is flat.     Palpations: Abdomen is soft.     Tenderness: There is no abdominal tenderness. There is no right CVA tenderness, left CVA tenderness, guarding or rebound.  Musculoskeletal:        General: Tenderness (scalp) present. No swelling.     Cervical back:  Neck supple. No tenderness.     Right lower leg: No edema.     Left lower leg: No edema.  Skin:    General: Skin is warm and dry.     Capillary Refill: Capillary refill takes less than 2 seconds.     Findings: No erythema.  Neurological:     General: No focal deficit present.     Mental Status: She is alert. Mental status is at baseline.     Sensory: No sensory deficit.     Motor: No weakness.  Psychiatric:        Mood and Affect: Mood normal.    ED Results / Procedures / Treatments   Labs (all labs ordered are listed, but only abnormal results are displayed) Labs Reviewed  COMPREHENSIVE METABOLIC PANEL - Abnormal; Notable for the following components:      Result Value   Glucose, Bld 173 (*)    BUN 25 (*)    Creatinine, Ser 1.80 (*)    All other components within normal limits  LACTIC ACID, PLASMA - Abnormal; Notable for the following components:   Lactic Acid, Venous 2.0 (*)    All other components within normal limits  COMPREHENSIVE METABOLIC PANEL - Abnormal; Notable for the following components:   Glucose, Bld 182 (*)    Creatinine, Ser 1.80 (*)    Total Protein 6.0 (*)    GFR, Estimated 28 (*)    All other components within normal limits  RESP PANEL BY RT-PCR (FLU A&B, COVID) ARPGX2  CBC  ETHANOL  PROTIME-INR  CBC  URINALYSIS, ROUTINE W REFLEX MICROSCOPIC  BRAIN  NATRIURETIC PEPTIDE  I-STAT CHEM 8, ED  SAMPLE TO BLOOD BANK  TROPONIN I (HIGH SENSITIVITY)  TROPONIN I (HIGH SENSITIVITY)    EKG EKG Interpretation  Date/Time:  Friday May 05 2021 20:15:09 EST Ventricular Rate:  64 PR Interval:  171 QRS Duration: 84 QT Interval:  459 QTC Calculation: 474 R Axis:   74 Text Interpretation: Sinus rhythm Nonspecific T abnormalities, anterior leads Baseline wander in lead(s) V5 When compared to prior, more wandering baseline. No STEMI Confirmed by Antony Blackbird [phone]) on 05/05/2021 8:40:33 PM  Radiology CT Head Wo Contrast  Result Date: 05/05/2021 CLINICAL DATA:  Neck trauma (Age >= 65y); Head trauma, abnormal mental status (Age 90-64y). Fall, head injury, chronic anticoagulation EXAM: CT HEAD WITHOUT CONTRAST CT CERVICAL SPINE WITHOUT CONTRAST TECHNIQUE: Multidetector CT imaging of the head and cervical spine was performed following the standard protocol without intravenous contrast. Multiplanar CT image reconstructions of the cervical spine were also generated. RADIATION DOSE REDUCTION: This exam was performed according to the departmental dose-optimization program which includes automated exposure control, adjustment of the mA and/or kV according to patient size and/or use of iterative reconstruction technique. COMPARISON:  06/28/2020 (CT head), 12/29/2020 (CT cervical spine) FINDINGS: CT HEAD FINDINGS Brain: Normal anatomic configuration. Parenchymal volume loss is commensurate with the patient's age. Extensive subcortical and periventricular white matter changes are present likely reflecting the sequela of small vessel ischemia. Remote lacunar infarct within the right thalamus noted. No abnormal intra or extra-axial mass lesion or fluid collection. No abnormal mass effect or midline shift. No evidence of acute intracranial hemorrhage or infarct. Ventricular size is normal. Cerebellum unremarkable. Vascular: No asymmetric hyperdense vasculature at the  skull base. Skull: Intact Sinuses/Orbits: Paranasal sinuses are clear. Ocular lenses have been removed. Orbits are otherwise unremarkable. Other: Mastoid air cells and middle ear cavities are clear. Small right parietal scalp hematoma. CT CERVICAL SPINE  FINDINGS Alignment: Stable 2-3 mm retrolisthesis C5-6 and minimal anterolisthesis C4-5, likely degenerative in nature. Otherwise normal cervical lordosis. Skull base and vertebrae: Craniocervical alignment is normal. Atlantodental interval is not widened. No acute fracture of the cervical spine. Ankylosis of the C3-4 facet joints bilaterally. Soft tissues and spinal canal: No canal hematoma. No prevertebral soft tissue swelling or fluid collection identified. No pathologic cervical adenopathy. Numerous surgical clips are seen within the neck bilaterally in keeping with prior probable lymph node dissection. Multiple thyroid nodules are identified, not well assessed on this examination, but grossly unchanged from prior examination and better assessed on sonogram of 11/24/2015. Disc levels: There is intervertebral disc space narrowing and endplate remodeling at Z6-6 in keeping with changes of moderate degenerative disc disease. Remaining intervertebral disc heights are preserved. Prevertebral soft tissues are not thickened. Review of the axial images demonstrates multilevel uncovertebral and facet arthrosis resulting in mild right neuroforaminal narrowing at C5-6. No significant canal stenosis. Upper chest: Stenting of the arch vasculature is noted, not well assessed on this examination. Moderate atherosclerotic calcification noted within the carotid bifurcations bilaterally. Other: None IMPRESSION: No acute intracranial injury. No calvarial fracture. Small right parietal scalp hematoma. Stable extensive white matter changes, likely the sequela of small vessel ischemia. No acute fracture or listhesis of the cervical spine. Electronically Signed   By: Fidela Salisbury M.D.    On: 05/05/2021 21:05   CT Cervical Spine Wo Contrast  Result Date: 05/05/2021 CLINICAL DATA:  Neck trauma (Age >= 65y); Head trauma, abnormal mental status (Age 65-64y). Fall, head injury, chronic anticoagulation EXAM: CT HEAD WITHOUT CONTRAST CT CERVICAL SPINE WITHOUT CONTRAST TECHNIQUE: Multidetector CT imaging of the head and cervical spine was performed following the standard protocol without intravenous contrast. Multiplanar CT image reconstructions of the cervical spine were also generated. RADIATION DOSE REDUCTION: This exam was performed according to the departmental dose-optimization program which includes automated exposure control, adjustment of the mA and/or kV according to patient size and/or use of iterative reconstruction technique. COMPARISON:  06/28/2020 (CT head), 12/29/2020 (CT cervical spine) FINDINGS: CT HEAD FINDINGS Brain: Normal anatomic configuration. Parenchymal volume loss is commensurate with the patient's age. Extensive subcortical and periventricular white matter changes are present likely reflecting the sequela of small vessel ischemia. Remote lacunar infarct within the right thalamus noted. No abnormal intra or extra-axial mass lesion or fluid collection. No abnormal mass effect or midline shift. No evidence of acute intracranial hemorrhage or infarct. Ventricular size is normal. Cerebellum unremarkable. Vascular: No asymmetric hyperdense vasculature at the skull base. Skull: Intact Sinuses/Orbits: Paranasal sinuses are clear. Ocular lenses have been removed. Orbits are otherwise unremarkable. Other: Mastoid air cells and middle ear cavities are clear. Small right parietal scalp hematoma. CT CERVICAL SPINE FINDINGS Alignment: Stable 2-3 mm retrolisthesis C5-6 and minimal anterolisthesis C4-5, likely degenerative in nature. Otherwise normal cervical lordosis. Skull base and vertebrae: Craniocervical alignment is normal. Atlantodental interval is not widened. No acute fracture of  the cervical spine. Ankylosis of the C3-4 facet joints bilaterally. Soft tissues and spinal canal: No canal hematoma. No prevertebral soft tissue swelling or fluid collection identified. No pathologic cervical adenopathy. Numerous surgical clips are seen within the neck bilaterally in keeping with prior probable lymph node dissection. Multiple thyroid nodules are identified, not well assessed on this examination, but grossly unchanged from prior examination and better assessed on sonogram of 11/24/2015. Disc levels: There is intervertebral disc space narrowing and endplate remodeling at A6-3 in keeping with changes of moderate degenerative disc  disease. Remaining intervertebral disc heights are preserved. Prevertebral soft tissues are not thickened. Review of the axial images demonstrates multilevel uncovertebral and facet arthrosis resulting in mild right neuroforaminal narrowing at C5-6. No significant canal stenosis. Upper chest: Stenting of the arch vasculature is noted, not well assessed on this examination. Moderate atherosclerotic calcification noted within the carotid bifurcations bilaterally. Other: None IMPRESSION: No acute intracranial injury. No calvarial fracture. Small right parietal scalp hematoma. Stable extensive white matter changes, likely the sequela of small vessel ischemia. No acute fracture or listhesis of the cervical spine. Electronically Signed   By: Fidela Salisbury M.D.   On: 05/05/2021 21:05   DG Chest Portable 1 View  Result Date: 05/05/2021 CLINICAL DATA:  Level 2 trauma. EXAM: PORTABLE CHEST 1 VIEW COMPARISON:  Chest x-ray 03/20/2021. FINDINGS: Cardiomediastinal silhouette is within normal limits. Vascular stents are seen in the upper mediastinum, unchanged. There are atherosclerotic calcifications of the aorta. There is some interstitial opacities in both lower lungs. There is a small left pleural effusion versus pleural thickening at the left costophrenic angle. There is no  evidence for pneumothorax. No acute fractures are seen. IMPRESSION: 1. Small left pleural effusion versus left pleural thickening. 2. No evidence for pneumonia or edema. Electronically Signed   By: Ronney Asters M.D.   On: 05/05/2021 20:28    Procedures .Marland KitchenLaceration Repair  Date/Time: 05/06/2021 12:24 AM Performed by: Courtney Paris, MD Authorized by: Courtney Paris, MD   Consent:    Consent obtained:  Verbal   Consent given by:  Patient   Risks, benefits, and alternatives were discussed: yes     Risks discussed:  Infection and pain   Alternatives discussed:  No treatment Universal protocol:    Immediately prior to procedure, a time out was called: yes     Patient identity confirmed:  Verbally with patient and hospital-assigned identification number Anesthesia:    Anesthesia method:  Topical application   Topical anesthetic:  LET Laceration details:    Location:  Scalp   Scalp location:  Occipital   Length (cm):  1   Depth (mm):  1 Pre-procedure details:    Preparation:  Patient was prepped and draped in usual sterile fashion and imaging obtained to evaluate for foreign bodies Exploration:    Limited defect created (wound extended): no     Contaminated: no   Treatment:    Area cleansed with:  Shur-Clens   Amount of cleaning:  Standard   Debridement:  None   Scar revision: no   Skin repair:    Repair method:  Staples   Number of staples:  3 Approximation:    Approximation:  Close Repair type:    Repair type:  Simple Post-procedure details:    Dressing:  Antibiotic ointment   Procedure completion:  Tolerated    Medications Ordered in ED Medications  Tdap (BOOSTRIX) injection 0.5 mL (0.5 mLs Intramuscular Given 05/05/21 2033)  lidocaine-EPINEPHrine-tetracaine (LET) topical gel (3 mLs Topical Given 05/05/21 2354)    ED Course/ Medical Decision Making/ A&P                           Medical Decision Making Amount and/or Complexity of Data  Reviewed Labs: ordered. Radiology: ordered. ECG/medicine tests: ordered.  Risk Prescription drug management.    Caitlin Oliver is a 81 y.o. female with a past medical history significant for hypertension, hyperlipidemia, diabetes, carotid disease, previous TIA and stroke on Plavix, COPD on  3 L home oxygen at baseline, and CHF on diuretics who presents with a fall and head injury and was made a level 2 trauma in route.  According to patient, she got tripped up with her walker and fell hitting the back of her head.  No loss of consciousness and minimal headache reported.  No neck pain or other acute complaints from the fall however she does report that for the last couple weeks has been having some chest pain on and off mildly.  She denies any new shortness of breath, congestion, cough, fevers, or chills.  She denies any numbness, tingling, weakness of extremities.  She denies nausea, vomiting, or vision changes now.  She is reporting some headache in the back of her head as well as some bleeding.  On arrival, airways intact.  Breath sounds equal bilaterally.  Chest and abdomen nontender.  Moving all extremities.  Patient had a portable chest x-ray which on my bedside review and interpretation did not show pneumonia or acute pneumothorax or rib fractures.  Patient will get CT of the head and neck to look for traumatic injuries.  On reassessment her injury does show a small laceration on the back of the scalp near the occiput.  Will washout and apply let and will use staples to close.  Due to the patient's chest pain and shortness of breath although more chronic, will get a troponin and a BNP.[name]  Other labs for trauma order set were overall reassuring.  Creatinine improved from prior.  COVID/flu negative.  Troponin not elevated after several hours of getting checked.  BNP is mildly elevated at 137.  CBC reassuring.  Chest x-ray showed pleural effusion but otherwise no significant edema and she is on  her home oxygen maintaining oxygen saturations.  After scalp repair, patient be discharged back to her facility and patient agrees.         Final Clinical Impression(s) / ED Diagnoses Final diagnoses:  Laceration of scalp, initial encounter  Fall, initial encounter     Clinical Impression: 1. Laceration of scalp, initial encounter   2. Fall, initial encounter     Disposition: Admit  This note was prepared with assistance of Systems analyst. Occasional wrong-word or sound-a-like substitutions may have occurred due to the inherent limitations of voice recognition software.      Muntaha Vermette, Gwenyth Allegra, MD 05/06/21 Laureen Abrahams

## 2021-05-05 NOTE — TOC CAGE-AID Note (Signed)
Transition of Care [facility]) - CAGE-AID Screening   Patient Details  Name: SALONI LABLANC MRN: [id number] Date of Birth: 07/09/1940  Transition of Care [facility]) CM/SW Contact:    Army Melia, RN Phone Number:[phone] 05/05/2021, 9:20 PM   Clinical Narrative: Fall on plavix with head injury. Denies alcohol and drug use, no resources needed at this time.   CAGE-AID Screening:    Have You Ever Felt You Ought to Cut Down on Your Drinking or Drug Use?: No Have People Annoyed You By Critizing Your Drinking Or Drug Use?: No Have You Felt Bad Or Guilty About Your Drinking Or Drug Use?: No Have You Ever Had a Drink or Used Drugs First Thing In The Morning to Steady Your Nerves or to Get Rid of a Hangover?: No CAGE-AID Score: 0  Substance Abuse Education Offered: No

## 2021-05-05 NOTE — ED Notes (Signed)
Patient transported to CT

## 2021-05-05 NOTE — Progress Notes (Signed)
°   05/05/21 2135  Clinical Encounter Type  Visited With Patient not available  Visit Type Initial  Referral From Nurse  Consult/Referral To None   Chaplain responded to a level 2 trauma alert. Patient was being cared for by the medical team.  No family was present.  Chaplain advised nurse if a chaplain was requested to please call.   [place] Resident [place]  [phone]

## 2021-05-06 LAB — CBC
HCT: 37 % (ref 36.0–46.0)
Hemoglobin: 12.6 g/dL (ref 12.0–15.0)

## 2021-05-06 LAB — COMPREHENSIVE METABOLIC PANEL
BUN: 25 mg/dL — ABNORMAL HIGH (ref 8–23)
Chloride: 100 mmol/L (ref 98–111)
Creatinine, Ser: 1.8 mg/dL — ABNORMAL HIGH (ref 0.44–1.00)
GFR, Estimated: 28 mL/min — ABNORMAL LOW (ref >60)
Glucose, Bld: 173 mg/dL — ABNORMAL HIGH (ref 70–99)
Potassium: 3.9 mmol/L (ref 3.5–5.1)
Sodium: 139 mmol/L (ref 135–145)

## 2021-05-06 NOTE — Discharge Instructions (Signed)
Your history, exam, work-up today are with a scalp laceration which we repaired and stapled after the CT imaging was reassuring.  Your labs were also consistent with prior.  He describes chronic chest discomfort and your heart enzyme was reassuring after several hours.  Your BNP was slightly elevated likely to some of the fluid overload so please continue your diuretics and talk to your PCP about if these medications need adjusting.  Your vital signs are reassuring on monitoring and given your improvement in symptoms we feel you are safe for discharge home.  Please rest and stay hydrated and return for any new or worsened symptoms.

## 2021-05-12 ENCOUNTER — Inpatient Hospital Stay (HOSPITAL_COMMUNITY): Payer: PPO | Admitting: Anesthesiology

## 2021-05-12 ENCOUNTER — Inpatient Hospital Stay (HOSPITAL_COMMUNITY)
Admission: EM | Admit: 2021-05-12 | Discharge: 2021-05-27 | DRG: 492 | Disposition: A | Discharge status: Skilled Nursing Facility | Payer: PPO | Attending: Family Medicine | Admitting: Family Medicine

## 2021-05-12 ENCOUNTER — Emergency Department (HOSPITAL_COMMUNITY): Payer: PPO

## 2021-05-12 ENCOUNTER — Other Ambulatory Visit: Payer: Self-pay

## 2021-05-12 ENCOUNTER — Encounter (HOSPITAL_COMMUNITY): Payer: Self-pay

## 2021-05-12 DIAGNOSIS — R296 Repeated falls: Secondary | ICD-10-CM | POA: Diagnosis present

## 2021-05-12 DIAGNOSIS — Z7902 Long term (current) use of antithrombotics/antiplatelets: Secondary | ICD-10-CM

## 2021-05-12 DIAGNOSIS — I472 Ventricular tachycardia, unspecified: Secondary | ICD-10-CM | POA: Diagnosis not present

## 2021-05-12 DIAGNOSIS — Y92009 Unspecified place in unspecified non-institutional (private) residence as the place of occurrence of the external cause: Secondary | ICD-10-CM | POA: Diagnosis not present

## 2021-05-12 DIAGNOSIS — E869 Volume depletion, unspecified: Secondary | ICD-10-CM | POA: Diagnosis present

## 2021-05-12 DIAGNOSIS — K5900 Constipation, unspecified: Secondary | ICD-10-CM

## 2021-05-12 DIAGNOSIS — E1151 Type 2 diabetes mellitus with diabetic peripheral angiopathy without gangrene: Secondary | ICD-10-CM | POA: Diagnosis present

## 2021-05-12 DIAGNOSIS — J9601 Acute respiratory failure with hypoxia: Secondary | ICD-10-CM | POA: Diagnosis not present

## 2021-05-12 DIAGNOSIS — W19XXXS Unspecified fall, sequela: Secondary | ICD-10-CM | POA: Diagnosis not present

## 2021-05-12 DIAGNOSIS — N1831 Chronic kidney disease, stage 3a: Secondary | ICD-10-CM | POA: Diagnosis not present

## 2021-05-12 DIAGNOSIS — E1122 Type 2 diabetes mellitus with diabetic chronic kidney disease: Secondary | ICD-10-CM | POA: Diagnosis present

## 2021-05-12 DIAGNOSIS — E1140 Type 2 diabetes mellitus with diabetic neuropathy, unspecified: Secondary | ICD-10-CM | POA: Diagnosis present

## 2021-05-12 DIAGNOSIS — E7800 Pure hypercholesterolemia, unspecified: Secondary | ICD-10-CM | POA: Diagnosis present

## 2021-05-12 DIAGNOSIS — S82451A Displaced comminuted fracture of shaft of right fibula, initial encounter for closed fracture: Secondary | ICD-10-CM | POA: Diagnosis not present

## 2021-05-12 DIAGNOSIS — J9622 Acute and chronic respiratory failure with hypercapnia: Secondary | ICD-10-CM | POA: Diagnosis present

## 2021-05-12 DIAGNOSIS — J441 Chronic obstructive pulmonary disease with (acute) exacerbation: Secondary | ICD-10-CM | POA: Diagnosis not present

## 2021-05-12 DIAGNOSIS — F05 Delirium due to known physiological condition: Secondary | ICD-10-CM | POA: Diagnosis present

## 2021-05-12 DIAGNOSIS — I471 Supraventricular tachycardia: Secondary | ICD-10-CM | POA: Diagnosis present

## 2021-05-12 DIAGNOSIS — N179 Acute kidney failure, unspecified: Secondary | ICD-10-CM | POA: Diagnosis not present

## 2021-05-12 DIAGNOSIS — R410 Disorientation, unspecified: Secondary | ICD-10-CM | POA: Diagnosis not present

## 2021-05-12 DIAGNOSIS — Y92239 Unspecified place in hospital as the place of occurrence of the external cause: Secondary | ICD-10-CM | POA: Diagnosis not present

## 2021-05-12 DIAGNOSIS — W1830XA Fall on same level, unspecified, initial encounter: Secondary | ICD-10-CM | POA: Diagnosis present

## 2021-05-12 DIAGNOSIS — J9621 Acute and chronic respiratory failure with hypoxia: Secondary | ICD-10-CM | POA: Diagnosis present

## 2021-05-12 DIAGNOSIS — N1832 Chronic kidney disease, stage 3b: Secondary | ICD-10-CM | POA: Diagnosis present

## 2021-05-12 DIAGNOSIS — I700 Atherosclerosis of aorta: Secondary | ICD-10-CM | POA: Diagnosis present

## 2021-05-12 DIAGNOSIS — G9340 Encephalopathy, unspecified: Secondary | ICD-10-CM | POA: Diagnosis not present

## 2021-05-12 DIAGNOSIS — W19XXXA Unspecified fall, initial encounter: Secondary | ICD-10-CM

## 2021-05-12 DIAGNOSIS — Z20822 Contact with and (suspected) exposure to covid-19: Secondary | ICD-10-CM | POA: Diagnosis present

## 2021-05-12 DIAGNOSIS — S82201A Unspecified fracture of shaft of right tibia, initial encounter for closed fracture: Secondary | ICD-10-CM | POA: Diagnosis present

## 2021-05-12 DIAGNOSIS — Z87891 Personal history of nicotine dependence: Secondary | ICD-10-CM

## 2021-05-12 DIAGNOSIS — J449 Chronic obstructive pulmonary disease, unspecified: Secondary | ICD-10-CM | POA: Diagnosis present

## 2021-05-12 DIAGNOSIS — S82201G Unspecified fracture of shaft of right tibia, subsequent encounter for closed fracture with delayed healing: Secondary | ICD-10-CM | POA: Diagnosis not present

## 2021-05-12 DIAGNOSIS — R042 Hemoptysis: Secondary | ICD-10-CM | POA: Diagnosis not present

## 2021-05-12 DIAGNOSIS — S82241A Displaced spiral fracture of shaft of right tibia, initial encounter for closed fracture: Secondary | ICD-10-CM

## 2021-05-12 DIAGNOSIS — T148XXA Other injury of unspecified body region, initial encounter: Secondary | ICD-10-CM

## 2021-05-12 DIAGNOSIS — R778 Other specified abnormalities of plasma proteins: Secondary | ICD-10-CM | POA: Diagnosis not present

## 2021-05-12 DIAGNOSIS — R140 Abdominal distension (gaseous): Secondary | ICD-10-CM

## 2021-05-12 DIAGNOSIS — Z8711 Personal history of peptic ulcer disease: Secondary | ICD-10-CM

## 2021-05-12 DIAGNOSIS — Z6830 Body mass index (BMI) 30.0-30.9, adult: Secondary | ICD-10-CM

## 2021-05-12 DIAGNOSIS — S82209A Unspecified fracture of shaft of unspecified tibia, initial encounter for closed fracture: Secondary | ICD-10-CM | POA: Diagnosis present

## 2021-05-12 DIAGNOSIS — R58 Hemorrhage, not elsewhere classified: Secondary | ICD-10-CM | POA: Diagnosis present

## 2021-05-12 DIAGNOSIS — Z823 Family history of stroke: Secondary | ICD-10-CM

## 2021-05-12 DIAGNOSIS — J411 Mucopurulent chronic bronchitis: Secondary | ICD-10-CM | POA: Diagnosis not present

## 2021-05-12 DIAGNOSIS — I959 Hypotension, unspecified: Secondary | ICD-10-CM

## 2021-05-12 DIAGNOSIS — I2722 Pulmonary hypertension due to left heart disease: Secondary | ICD-10-CM | POA: Diagnosis present

## 2021-05-12 DIAGNOSIS — R6521 Severe sepsis with septic shock: Secondary | ICD-10-CM | POA: Diagnosis not present

## 2021-05-12 DIAGNOSIS — Z841 Family history of disorders of kidney and ureter: Secondary | ICD-10-CM

## 2021-05-12 DIAGNOSIS — R001 Bradycardia, unspecified: Secondary | ICD-10-CM | POA: Diagnosis not present

## 2021-05-12 DIAGNOSIS — J9612 Chronic respiratory failure with hypercapnia: Secondary | ICD-10-CM | POA: Diagnosis not present

## 2021-05-12 DIAGNOSIS — Z885 Allergy status to narcotic agent status: Secondary | ICD-10-CM

## 2021-05-12 DIAGNOSIS — I69392 Facial weakness following cerebral infarction: Secondary | ICD-10-CM

## 2021-05-12 DIAGNOSIS — I6389 Other cerebral infarction: Secondary | ICD-10-CM | POA: Diagnosis not present

## 2021-05-12 DIAGNOSIS — I2510 Atherosclerotic heart disease of native coronary artery without angina pectoris: Secondary | ICD-10-CM | POA: Diagnosis present

## 2021-05-12 DIAGNOSIS — Z87442 Personal history of urinary calculi: Secondary | ICD-10-CM

## 2021-05-12 DIAGNOSIS — Z9181 History of falling: Secondary | ICD-10-CM

## 2021-05-12 DIAGNOSIS — Z85118 Personal history of other malignant neoplasm of bronchus and lung: Secondary | ICD-10-CM

## 2021-05-12 DIAGNOSIS — Z90710 Acquired absence of both cervix and uterus: Secondary | ICD-10-CM

## 2021-05-12 DIAGNOSIS — G4730 Sleep apnea, unspecified: Secondary | ICD-10-CM | POA: Diagnosis present

## 2021-05-12 DIAGNOSIS — I5033 Acute on chronic diastolic (congestive) heart failure: Secondary | ICD-10-CM

## 2021-05-12 DIAGNOSIS — I639 Cerebral infarction, unspecified: Secondary | ICD-10-CM

## 2021-05-12 DIAGNOSIS — E8729 Other acidosis: Secondary | ICD-10-CM | POA: Diagnosis not present

## 2021-05-12 DIAGNOSIS — S82401A Unspecified fracture of shaft of right fibula, initial encounter for closed fracture: Secondary | ICD-10-CM | POA: Diagnosis not present

## 2021-05-12 DIAGNOSIS — J9811 Atelectasis: Secondary | ICD-10-CM | POA: Diagnosis not present

## 2021-05-12 DIAGNOSIS — R0602 Shortness of breath: Secondary | ICD-10-CM | POA: Diagnosis not present

## 2021-05-12 DIAGNOSIS — I2723 Pulmonary hypertension due to lung diseases and hypoxia: Secondary | ICD-10-CM | POA: Diagnosis present

## 2021-05-12 DIAGNOSIS — I2729 Other secondary pulmonary hypertension: Secondary | ICD-10-CM | POA: Diagnosis not present

## 2021-05-12 DIAGNOSIS — E43 Unspecified severe protein-calorie malnutrition: Secondary | ICD-10-CM | POA: Diagnosis present

## 2021-05-12 DIAGNOSIS — F419 Anxiety disorder, unspecified: Secondary | ICD-10-CM | POA: Diagnosis present

## 2021-05-12 DIAGNOSIS — Z7982 Long term (current) use of aspirin: Secondary | ICD-10-CM

## 2021-05-12 DIAGNOSIS — J9602 Acute respiratory failure with hypercapnia: Secondary | ICD-10-CM | POA: Diagnosis not present

## 2021-05-12 DIAGNOSIS — E119 Type 2 diabetes mellitus without complications: Secondary | ICD-10-CM

## 2021-05-12 DIAGNOSIS — G8929 Other chronic pain: Secondary | ICD-10-CM | POA: Diagnosis present

## 2021-05-12 DIAGNOSIS — K219 Gastro-esophageal reflux disease without esophagitis: Secondary | ICD-10-CM | POA: Diagnosis present

## 2021-05-12 DIAGNOSIS — F32A Depression, unspecified: Secondary | ICD-10-CM | POA: Diagnosis present

## 2021-05-12 DIAGNOSIS — M50322 Other cervical disc degeneration at C5-C6 level: Secondary | ICD-10-CM | POA: Diagnosis present

## 2021-05-12 DIAGNOSIS — G9341 Metabolic encephalopathy: Secondary | ICD-10-CM | POA: Diagnosis not present

## 2021-05-12 DIAGNOSIS — E1169 Type 2 diabetes mellitus with other specified complication: Secondary | ICD-10-CM | POA: Diagnosis not present

## 2021-05-12 DIAGNOSIS — Z8249 Family history of ischemic heart disease and other diseases of the circulatory system: Secondary | ICD-10-CM

## 2021-05-12 DIAGNOSIS — E669 Obesity, unspecified: Secondary | ICD-10-CM | POA: Diagnosis present

## 2021-05-12 DIAGNOSIS — J9611 Chronic respiratory failure with hypoxia: Secondary | ICD-10-CM | POA: Diagnosis not present

## 2021-05-12 DIAGNOSIS — S82409A Unspecified fracture of shaft of unspecified fibula, initial encounter for closed fracture: Secondary | ICD-10-CM

## 2021-05-12 DIAGNOSIS — Z66 Do not resuscitate: Secondary | ICD-10-CM | POA: Diagnosis present

## 2021-05-12 DIAGNOSIS — Z9981 Dependence on supplemental oxygen: Secondary | ICD-10-CM

## 2021-05-12 DIAGNOSIS — M1990 Unspecified osteoarthritis, unspecified site: Secondary | ICD-10-CM | POA: Diagnosis present

## 2021-05-12 DIAGNOSIS — T424X5A Adverse effect of benzodiazepines, initial encounter: Secondary | ICD-10-CM | POA: Diagnosis not present

## 2021-05-12 DIAGNOSIS — Z79899 Other long term (current) drug therapy: Secondary | ICD-10-CM

## 2021-05-12 DIAGNOSIS — M898X9 Other specified disorders of bone, unspecified site: Secondary | ICD-10-CM | POA: Diagnosis present

## 2021-05-12 DIAGNOSIS — R0603 Acute respiratory distress: Secondary | ICD-10-CM | POA: Diagnosis not present

## 2021-05-12 DIAGNOSIS — Z794 Long term (current) use of insulin: Secondary | ICD-10-CM | POA: Diagnosis not present

## 2021-05-12 DIAGNOSIS — D631 Anemia in chronic kidney disease: Secondary | ICD-10-CM | POA: Diagnosis present

## 2021-05-12 DIAGNOSIS — M80061A Age-related osteoporosis with current pathological fracture, right lower leg, initial encounter for fracture: Principal | ICD-10-CM | POA: Diagnosis present

## 2021-05-12 DIAGNOSIS — I5032 Chronic diastolic (congestive) heart failure: Secondary | ICD-10-CM | POA: Diagnosis present

## 2021-05-12 DIAGNOSIS — I130 Hypertensive heart and chronic kidney disease with heart failure and stage 1 through stage 4 chronic kidney disease, or unspecified chronic kidney disease: Secondary | ICD-10-CM | POA: Diagnosis present

## 2021-05-12 DIAGNOSIS — A419 Sepsis, unspecified organism: Secondary | ICD-10-CM

## 2021-05-12 DIAGNOSIS — Z419 Encounter for procedure for purposes other than remedying health state, unspecified: Secondary | ICD-10-CM

## 2021-05-12 DIAGNOSIS — S82401G Unspecified fracture of shaft of right fibula, subsequent encounter for closed fracture with delayed healing: Secondary | ICD-10-CM | POA: Diagnosis not present

## 2021-05-12 DIAGNOSIS — R29704 NIHSS score 4: Secondary | ICD-10-CM | POA: Diagnosis not present

## 2021-05-12 DIAGNOSIS — Z7984 Long term (current) use of oral hypoglycemic drugs: Secondary | ICD-10-CM

## 2021-05-12 LAB — CBC WITH DIFFERENTIAL/PLATELET
Abs Immature Granulocytes: 0.06 10*3/uL (ref 0.00–0.07)
Basophils Absolute: 0 10*3/uL (ref 0.0–0.1)
Basophils Relative: 0 %
Eosinophils Absolute: 0.3 10*3/uL (ref 0.0–0.5)
Eosinophils Relative: 3 %
HCT: 39.7 % (ref 36.0–46.0)
Hemoglobin: 12.8 g/dL (ref 12.0–15.0)
Immature Granulocytes: 1 %
Lymphocytes Relative: 7 %
Lymphs Abs: 0.7 10*3/uL (ref 0.7–4.0)
MCH: 30 pg (ref 26.0–34.0)
MCHC: 32.2 g/dL (ref 30.0–36.0)
MCV: 93 fL (ref 80.0–100.0)
Monocytes Absolute: 0.4 10*3/uL (ref 0.1–1.0)
Monocytes Relative: 4 %
Neutro Abs: 8.7 10*3/uL — ABNORMAL HIGH (ref 1.7–7.7)
Neutrophils Relative %: 85 %
Platelets: 232 10*3/uL (ref 150–400)
RBC: 4.27 MIL/uL (ref 3.87–5.11)
RDW: 12.3 % (ref 11.5–15.5)
WBC: 10.2 10*3/uL (ref 4.0–10.5)
nRBC: 0 % (ref 0.0–0.2)

## 2021-05-12 LAB — COMPREHENSIVE METABOLIC PANEL
ALT: 16 U/L (ref 0–44)
AST: 20 U/L (ref 15–41)
Albumin: 3.8 g/dL (ref 3.5–5.0)
Alkaline Phosphatase: 58 U/L (ref 38–126)
Anion gap: 7 (ref 5–15)
BUN: 19 mg/dL (ref 8–23)
CO2: 34 mmol/L — ABNORMAL HIGH (ref 22–32)
Calcium: 9.1 mg/dL (ref 8.9–10.3)
Chloride: 96 mmol/L — ABNORMAL LOW (ref 98–111)
Creatinine, Ser: 1.74 mg/dL — ABNORMAL HIGH (ref 0.44–1.00)
GFR, Estimated: 29 mL/min — ABNORMAL LOW (ref >60)
Glucose, Bld: 213 mg/dL — ABNORMAL HIGH (ref 70–99)
Potassium: 4.9 mmol/L (ref 3.5–5.1)
Sodium: 137 mmol/L (ref 135–145)
Total Bilirubin: 0.4 mg/dL (ref 0.3–1.2)
Total Protein: 6.5 g/dL (ref 6.5–8.1)

## 2021-05-12 LAB — TROPONIN I (HIGH SENSITIVITY)
Troponin I (High Sensitivity): 6 ng/L (ref <18)
Troponin I (High Sensitivity): 7 ng/L (ref <18)

## 2021-05-12 LAB — RESP PANEL BY RT-PCR (FLU A&B, COVID) ARPGX2
Influenza A by PCR: NEGATIVE
Influenza B by PCR: NEGATIVE
SARS Coronavirus 2 by RT PCR: NEGATIVE

## 2021-05-12 LAB — PROTIME-INR
INR: 1 (ref 0.8–1.2)
Prothrombin Time: 13.1 seconds (ref 11.4–15.2)

## 2021-05-12 MED ORDER — MORPHINE SULFATE (PF) 4 MG/ML IV SOLN
4.0000 mg | Freq: Once | INTRAVENOUS | Status: AC
  Administered 2021-05-12: 4 mg via INTRAVENOUS
  Filled 2021-05-12: qty 1

## 2021-05-12 MED ORDER — SODIUM CHLORIDE 0.9 % IV SOLN: INTRAVENOUS | Status: DC

## 2021-05-12 MED ORDER — LACTATED RINGERS IV BOLUS
500.0000 mL | Freq: Once | INTRAVENOUS | Status: AC
Start: 2021-05-12 — End: 2021-05-12
  Administered 2021-05-12: 500 mL via INTRAVENOUS

## 2021-05-12 MED ORDER — ONDANSETRON HCL 4 MG/2ML IJ SOLN
4.0000 mg | Freq: Once | INTRAMUSCULAR | Status: AC
  Administered 2021-05-12: 4 mg via INTRAVENOUS
  Filled 2021-05-12: qty 2

## 2021-05-12 MED ORDER — DIAZEPAM 2 MG PO TABS
2.0000 mg | ORAL_TABLET | Freq: Two times a day (BID) | ORAL | Status: DC | PRN
  Filled 2021-05-12: qty 1

## 2021-05-12 MED ORDER — OXYCODONE HCL 5 MG PO TABS
5.0000 mg | ORAL_TABLET | ORAL | Status: DC | PRN
Start: 2021-05-12 — End: 2021-05-13
  Administered 2021-05-12 – 2021-05-13 (×2): 5 mg via ORAL
  Filled 2021-05-12 (×2): qty 1

## 2021-05-12 MED ORDER — ACETAMINOPHEN 325 MG PO TABS
650.0000 mg | ORAL_TABLET | Freq: Four times a day (QID) | ORAL | Status: DC
  Administered 2021-05-12 – 2021-05-13 (×2): 650 mg via ORAL
  Filled 2021-05-12 (×4): qty 2

## 2021-05-12 NOTE — Progress Notes (Signed)
Orthopedic Tech Progress Note Patient Details:  Caitlin Oliver [birth year] [id number]  Ortho Devices Type of Ortho Device: Long leg splint Ortho Device/Splint Location: RLE Ortho Device/Splint Interventions: Ordered, Application   Post Interventions Patient Tolerated: Well  Makaelyn Aponte A Ayn Domangue 05/12/2021, 5:35 PM

## 2021-05-12 NOTE — ED Provider Notes (Signed)
[facility] EMERGENCY DEPARTMENT Provider Note   CSN: [id number] Arrival date & time: 05/12/21  1144     History  Chief Complaint  Patient presents with   Fall   Leg Pain    Caitlin Oliver is a 81 y.o. female who presents with concern for questionable syncopal episode versus mechanical fall today.  Patient states that she was standing in her bathroom sink when she attempted to bend over  and when she stood up she felt dizzy and reports her leg gave out.  She states she does not remember if she lost consciousness, states he was unable to get up off the ground due to severe pain in her right lower extremity.  Was able to scoot on her buttocks to the wheelchair in the hallway and called EMS.  Patient with history of multiple falls most recently 1 week ago from which she has staples to the posterior scalp.  She does state that prior to her fall she felt "swimmy headed" concerning for possible syncope.  She is anticoagulated on aspirin and Plavix.  I personally reviewed this patient's medical records.  She has history of type 2 diabetes, COPD on 3 L supplemental oxygen by nasal cannula at home at all times, CHF, CVA, hypertension and peripheral vascular disease.  HPI     Home Medications Prior to Admission medications   Medication Sig Start Date End Date Taking? Authorizing Provider  acetaminophen (TYLENOL) 500 MG tablet Take 500 mg by mouth every 6 (six) hours as needed for moderate pain.   Yes [provider]  albuterol (PROVENTIL HFA;VENTOLIN HFA) 108 (90 Base) MCG/ACT inhaler Inhale 2 puffs into the lungs every 6 (six) hours as needed for wheezing or shortness of breath.  07/11/17  Yes [provider]  albuterol (PROVENTIL) (2.5 MG/3ML) 0.083% nebulizer solution Take 2.5 mg by nebulization in the morning and at bedtime.   Yes [provider]  Ascorbic Acid (VITAMIN C) 1000 MG tablet Take 1,000 mg by mouth daily.   Yes [provider]   Biotin 1000 MCG tablet Take 1,000 mcg by mouth daily.   Yes [provider]  clidinium-chlordiazePOXIDE (LIBRAX) 5-2.5 MG capsule Take 1 capsule by mouth 2 (two) times daily.   Yes [provider]  clopidogrel (PLAVIX) 75 MG tablet Take 1 tablet (75 mg total) by mouth daily. 06/20/18  Yes Thurnell Lose, MD  diazepam (VALIUM) 5 MG tablet Take 5 mg by mouth every 8 (eight) hours as needed for anxiety. 09/08/19  Yes [provider]  dicyclomine (BENTYL) 20 MG tablet Take 20 mg by mouth daily as needed for spasms.   Yes [provider]  docusate sodium (COLACE) 100 MG capsule Take 100 mg by mouth 2 (two) times daily as needed for mild constipation.   Yes [provider]  ferrous sulfate 325 (65 FE) MG tablet Take 325 mg by mouth daily with breakfast.   Yes [provider]  FLUoxetine (PROZAC) 20 MG capsule Take 20 mg by mouth daily.   Yes [provider]  gabapentin (NEURONTIN) 300 MG capsule Take 300 mg by mouth at bedtime.   Yes [provider]  HYDROcodone-acetaminophen (NORCO/VICODIN) 5-325 MG tablet Take 1 tablet by mouth every 6 (six) hours as needed for moderate pain. 06/21/19  Yes Dagoberto Ligas, PA-C  Magnesium 250 MG TABS Take 250 mg by mouth daily.   Yes [provider]  metFORMIN (GLUCOPHAGE) 500 MG tablet Take by mouth 2 (two)  times daily with a meal.   Yes [provider]  metolazone (ZAROXOLYN) 5 MG tablet Take 5 mg by mouth daily as needed (Fluid).   Yes [provider]  metoprolol tartrate (LOPRESSOR) 50 MG tablet Take 50 mg by mouth 2 (two) times daily.   Yes [provider]  naphazoline-glycerin (CLEAR EYES REDNESS) 0.012-0.25 % SOLN 1-2 drops 4 (four) times daily as needed for eye irritation.   Yes [provider]  ondansetron (ZOFRAN) 4 MG tablet Take 4 mg by mouth at bedtime as needed for nausea or vomiting.  06/05/18  Yes [provider]  OXYGEN Inhale  into the lungs. 3 liters n/c   Yes [provider]  pantoprazole (PROTONIX) 40 MG tablet Take 40 mg by mouth 2 (two) times daily.  12/17/13  Yes [provider]  polyethylene glycol (MIRALAX / GLYCOLAX) 17 g packet Take 17 g by mouth daily as needed for mild constipation. 12/02/18  Yes [provider]  potassium chloride SA (KLOR-CON M) 20 MEQ tablet Take 20 mEq by mouth daily.   Yes [provider]  pravastatin (PRAVACHOL) 40 MG tablet Take 20 mg by mouth daily before supper.    Yes [provider]  torsemide (DEMADEX) 20 MG tablet Take 2 tablets (40 mg total) by mouth twice a day Patient taking differently: Take 40 mg by mouth 2 (two) times daily. 01/06/21  Yes Bensimhon, Shaune Pascal, MD  traZODone (DESYREL) 50 MG tablet Take 50 mg by mouth at bedtime.   Yes [provider]  ONE TOUCH ULTRA TEST test strip  09/12/16   [provider]  revefenacin (YUPELRI) 175 MCG/3ML nebulizer solution Take 3 mLs (175 mcg total) by nebulization daily. Patient not taking: Reported on 05/12/2021 04/03/21   Spero Geralds, MD      Allergies    Fentanyl    Review of Systems   Review of Systems  Constitutional: Negative.   HENT: Negative.    Respiratory: Negative.    Cardiovascular: Negative.   Gastrointestinal: Negative.   Musculoskeletal: Negative.        RLE deformity  Neurological:  Positive for dizziness, syncope, light-headedness and headaches.  All other systems reviewed and are negative.  Physical Exam Updated Vital Signs BP 104/63    Pulse (!) 59    Temp 98.2 F (36.8 C) (Oral)    Resp 18    SpO2 100%  Physical Exam Vitals and nursing note reviewed.  Constitutional:      General: She is in acute distress (Pain from RLE).     Appearance: She is not ill-appearing or toxic-appearing.  HENT:     Head: Normocephalic.     Jaw: There is normal jaw occlusion.      Nose: Nose normal.     Mouth/Throat:     Mouth: Mucous membranes are  moist.     Pharynx: Oropharynx is clear. Uvula midline. No oropharyngeal exudate or posterior oropharyngeal erythema.     Tonsils: No tonsillar exudate.  Eyes:     General: Lids are normal. Vision grossly intact.        Right eye: No discharge.        Left eye: No discharge.     Extraocular Movements: Extraocular movements intact.     Conjunctiva/sclera: Conjunctivae normal.     Pupils: Pupils are equal, round, and reactive to light.  Neck:     Trachea: Trachea and phonation normal.  Cardiovascular:     Rate and Rhythm:  Normal rate and regular rhythm.     Pulses: Normal pulses.     Heart sounds: Normal heart sounds. No murmur heard. Pulmonary:     Effort: Pulmonary effort is normal. No tachypnea, bradypnea, accessory muscle usage, prolonged expiration or respiratory distress.     Breath sounds: Normal breath sounds. No wheezing or rales.  Chest:     Chest wall: No mass, lacerations, deformity, swelling, tenderness, crepitus or edema.  Abdominal:     General: Bowel sounds are normal. There is no distension.     Palpations: Abdomen is soft.     Tenderness: There is no abdominal tenderness. There is no right CVA tenderness, left CVA tenderness, guarding or rebound.  Musculoskeletal:        General: No deformity.     Cervical back: Normal range of motion and neck supple. No rigidity or crepitus. Spinous process tenderness present. No pain with movement or muscular tenderness.       Legs:     Comments: Compartments are soft in the right lower extremity on initial evaluation.  Lymphadenopathy:     Cervical: No cervical adenopathy.  Skin:    General: Skin is warm and dry.     Capillary Refill: Capillary refill takes less than 2 seconds.  Neurological:     General: No focal deficit present.     Mental Status: She is alert and oriented to person, place, and time. Mental status is at baseline.  Psychiatric:        Mood and Affect: Mood normal.    ED Results / Procedures / Treatments    Labs (all labs ordered are listed, but only abnormal results are displayed) Labs Reviewed  CBC WITH DIFFERENTIAL/PLATELET - Abnormal; Notable for the following components:      Result Value   Neutro Abs 8.7 (*)    All other components within normal limits  COMPREHENSIVE METABOLIC PANEL - Abnormal; Notable for the following components:   Chloride 96 (*)    CO2 34 (*)    Glucose, Bld 213 (*)    Creatinine, Ser 1.74 (*)    GFR, Estimated 29 (*)    All other components within normal limits  RESP PANEL BY RT-PCR (FLU A&B, COVID) ARPGX2  PROTIME-INR  URINALYSIS, ROUTINE W REFLEX MICROSCOPIC  BASIC METABOLIC PANEL  CBC  TROPONIN I (HIGH SENSITIVITY)  TROPONIN I (HIGH SENSITIVITY)    EKG EKG Interpretation  Date/Time:  Friday May 12 2021 12:01:58 EST Ventricular Rate:  55 PR Interval:  166 QRS Duration: 89 QT Interval:  475 QTC Calculation: 455 R Axis:   83 Text Interpretation: Sinus rhythm Borderline right axis deviation Minimal ST elevation, lateral leads Confirmed by Madalyn Rob [phone]) on 05/12/2021 1:43:45 PM  Radiology DG Chest 1 View  Result Date: 05/12/2021 CLINICAL DATA:  Trauma, fall EXAM: CHEST  1 VIEW COMPARISON:  05/05/2021 FINDINGS: There is haziness in the left lower lung fields and blunting of left lateral CP angle suggesting left pleural effusion. Transverse diameter of heart is increased. Thoracic aorta is tortuous. There are no signs of pulmonary edema or focal pulmonary consolidation. There is no pneumothorax. Vascular stents are noted in the courses of major branches of aortic arch. IMPRESSION: Small left pleural effusion. There are no signs of pulmonary edema or focal pulmonary consolidation. Electronically Signed   By: Elmer Picker M.D.   On: 05/12/2021 12:53   DG Pelvis 1-2 Views  Result Date: 05/12/2021 CLINICAL DATA:  Trauma, fall EXAM: PELVIS - 1-2 VIEW COMPARISON:  None. FINDINGS: No displaced fracture or dislocation is seen. Arterial  calcifications are seen. Vascular stents are noted in the common iliac arteries. IMPRESSION: No recent fracture or dislocation is seen in the pelvis. Electronically Signed   By: Elmer Picker M.D.   On: 05/12/2021 12:51   DG Tibia/Fibula Right  Result Date: 05/12/2021 CLINICAL DATA:  Trauma, fall EXAM: RIGHT TIBIA AND FIBULA - 2 VIEW COMPARISON:  None. FINDINGS: Comminuted spiral fracture is seen in the proximal shaft of fibula. Spiral fracture is seen in the distal shaft of tibia. There is lateral displacement of distal major fracture fragment in the tibia and fibula. IMPRESSION: Displaced fractures are seen in the proximal shaft of right fibula and distal shaft of right tibia. Electronically Signed   By: Elmer Picker M.D.   On: 05/12/2021 12:46   CT HEAD WO CONTRAST (5MM)  Result Date: 05/12/2021 CLINICAL DATA:  Status post fall. EXAM: CT HEAD WITHOUT CONTRAST CT CERVICAL SPINE WITHOUT CONTRAST TECHNIQUE: Multidetector CT imaging of the head and cervical spine was performed following the standard protocol without intravenous contrast. Multiplanar CT image reconstructions of the cervical spine were also generated. RADIATION DOSE REDUCTION: This exam was performed according to the departmental dose-optimization program which includes automated exposure control, adjustment of the mA and/or kV according to patient size and/or use of iterative reconstruction technique. COMPARISON:  May 05, 2021. FINDINGS: CT HEAD FINDINGS Brain: Mild chronic ischemic white matter disease is noted. No mass effect or midline shift is noted. Ventricular size is within normal limits. There is no evidence of mass lesion, hemorrhage or acute infarction. Vascular: No hyperdense vessel or unexpected calcification. Skull: Normal. Negative for fracture or focal lesion. Sinuses/Orbits: No acute finding. Other: None. CT CERVICAL SPINE FINDINGS Alignment: Minimal grade 1 retrolisthesis of C5-6 is noted secondary to severe  degenerative disc disease at this level. Skull base and vertebrae: No acute fracture. No primary bone lesion or focal pathologic process. Soft tissues and spinal canal: No prevertebral fluid or swelling. No visible canal hematoma. Disc levels:  Severe degenerative disc disease is noted at C5-6. Upper chest: Negative. Other: None. IMPRESSION: No acute intracranial abnormality seen. Severe degenerative disc disease noted at C5-6. No acute abnormality seen in the cervical spine. Electronically Signed   By: Marijo Conception M.D.   On: 05/12/2021 13:53   CT Cervical Spine Wo Contrast  Result Date: 05/12/2021 CLINICAL DATA:  Status post fall. EXAM: CT HEAD WITHOUT CONTRAST CT CERVICAL SPINE WITHOUT CONTRAST TECHNIQUE: Multidetector CT imaging of the head and cervical spine was performed following the standard protocol without intravenous contrast. Multiplanar CT image reconstructions of the cervical spine were also generated. RADIATION DOSE REDUCTION: This exam was performed according to the departmental dose-optimization program which includes automated exposure control, adjustment of the mA and/or kV according to patient size and/or use of iterative reconstruction technique. COMPARISON:  May 05, 2021. FINDINGS: CT HEAD FINDINGS Brain: Mild chronic ischemic white matter disease is noted. No mass effect or midline shift is noted. Ventricular size is within normal limits. There is no evidence of mass lesion, hemorrhage or acute infarction. Vascular: No hyperdense vessel or unexpected calcification. Skull: Normal. Negative for fracture or focal lesion. Sinuses/Orbits: No acute finding. Other: None. CT CERVICAL SPINE FINDINGS Alignment: Minimal grade 1 retrolisthesis of C5-6 is noted secondary to severe degenerative disc disease at this level. Skull base and vertebrae: No acute fracture. No primary bone lesion or focal pathologic process. Soft tissues and spinal canal: No prevertebral fluid  or swelling. No visible  canal hematoma. Disc levels:  Severe degenerative disc disease is noted at C5-6. Upper chest: Negative. Other: None. IMPRESSION: No acute intracranial abnormality seen. Severe degenerative disc disease noted at C5-6. No acute abnormality seen in the cervical spine. Electronically Signed   By: Marijo Conception M.D.   On: 05/12/2021 13:53   DG Foot Complete Right  Result Date: 05/12/2021 CLINICAL DATA:  Trauma, fall EXAM: RIGHT FOOT COMPLETE - 3+ VIEW COMPARISON:  None. FINDINGS: No fracture or dislocation is seen. IMPRESSION: No fracture or dislocation is seen in the right foot. Electronically Signed   By: Elmer Picker M.D.   On: 05/12/2021 12:49   DG Femur Min 2 Views Right  Result Date: 05/12/2021 CLINICAL DATA:  Trauma, fall EXAM: RIGHT FEMUR 2 VIEWS COMPARISON:  None. FINDINGS: No recent fracture or dislocation is seen. Arterial calcifications are seen in the soft tissues. IMPRESSION: No fracture or dislocation is seen in the right femur. Electronically Signed   By: Elmer Picker M.D.   On: 05/12/2021 12:50    Procedures Procedures    Medications Ordered in ED Medications  acetaminophen (TYLENOL) tablet 650 mg (650 mg Oral Given 05/12/21 1936)  oxyCODONE (Oxy IR/ROXICODONE) immediate release tablet 5 mg (has no administration in time range)  morphine 4 MG/ML injection 4 mg (4 mg Intravenous Given 05/12/21 1241)  ondansetron (ZOFRAN) injection 4 mg (4 mg Intravenous Given 05/12/21 1240)  lactated ringers bolus 500 mL (0 mLs Intravenous Stopped 05/12/21 1637)  morphine 4 MG/ML injection 4 mg (4 mg Intravenous Given 05/12/21 1512)    ED Course/ Medical Decision Making/ A&P Clinical Course as of 05/12/21 2003  Fri May 12, 2021  1351 Consult to Dr. Ninfa Linden, who states he is currently scrubbing into a multihour procedure at [facility] [address], will attempt to have another provider see her. [RS]  1419 Call back from Dr. Ninfa Linden, who states that he has reached out for assistance in  consulting on this patient. I appreciate his collaboration in her care.  [RS]  0160 Patient reevaluated, right lower extremity compartments remain soft.  1+ DP pulse remains palpable. [RS]  1093 Consult to family medicine service who is agreeable to seeing this patient and admitting him to their service.  I will keep him apprised of any updates from the orthopedic service. [RS]  2355 Patient compartments reevaluated q. 20 minutes and remain soft this time.  Very minimally increased and there firmness since arrival to the ED but remains with symmetric DP pulses in the lower extremities bilaterally without any diminishment of the pulse since arrival.  Normal capillary refill and sensation in the foot as well.  Increased bruising to the leg consistent with underlying injury; we will continue to monitor. [RS]  7322 Dr. Leda Quail, family medicine, at the bedside.  [RS]  1540 Dr. Marlou Sa, orthopedic surgeon at the bedside, who requests posterior long-leg with stirrup splint to be placed.  Plan for OR either later this evening with Dr. Ninfa Linden or possibly tomorrow morning.  Compartments remain soft at this time and patient is neurovascularly intact in the leg. I appreciate his collaboration in the care of this patient.  [RS]    Clinical Course User Index [RS] Kimiya Brunelle, Gypsy Balsam, PA-C                           Medical Decision Making 81 year old female who presents after syncope versus fall at home with concern  for deformity and significant pain in the right lower extremity as well as pain in the head and the neck. Hypertensive and tachypneic on intake.  Vital signs otherwise normal.  Cardiopulmonary exam is normal, abdominal exam is benign.  Patient with tenderness palpation over the posterior scalp where she has 3 staples in place.  Additionally with deformity to the right lower leg with bruising over the right lateral lower leg.  Soft compartments, normal DP pulses and cap refill is not digits of the right  foot.  Patient left in splint.   Amount and/or Complexity of Data Reviewed Labs: ordered.    Details: CBC unremarkable, CMP with creatinine of 1.7 at patient's baseline.  INR is normal, troponin is negative, 6 Radiology: ordered and independent interpretation performed.    Details: Chest x-ray with small left-sided pleural effusion but otherwise unremarkable.  Plain film of the pelvis is negative, plain film of the femur on the right is negative.  Plain film of the right foot is negative, unfortunately patient with comminuted spiral fracture of the proximal fibula on the right and spiral fracture of the distal midshaft tibia with lateral displacement of the distal fracture fragment. CT head unremarkable, CT C-spine no acute fracture ECG/medicine tests: ordered and independent interpretation performed.    Details: EKG with sinus rhythm no STEMI. Discussion of management or test interpretation with external provider(s): Case discussed with orthopedics Dr. Marlou Sa who recommends medical admission as planned.  Patient will likely go to the OR with Dr. Ninfa Linden either later tonight or tomorrow morning. Case discussed with family medicine service for admission who are agreeable to seeing this patient.  Risk Prescription drug management. Decision regarding hospitalization.  Given patient's severe tib-fib fractures with high risk for compartment syndrome, feel she will benefit from admission to the hospital at this time.  Rickelle voiced understanding of her medical evaluation and treatment plan.  Each of her questions was answered to her expressed inspection.  Patient has minimal plan for admission at this time.  Patient remains with soft compartments throughout my evaluation.  Her care was signed over to admitting team.  This chart was dictated using voice recognition software, Dragon. Despite the best efforts of this provider to proofread and correct errors, errors may still occur which can change  documentation meaning.  Final Clinical Impression(s) / ED Diagnoses Final diagnoses:  Fall    Rx / DC Orders ED Discharge Orders     None         Aura Dials 05/12/21 2003    Dykstra, Richard S, MD 05/13/21 [phone]

## 2021-05-12 NOTE — ED Triage Notes (Signed)
Pt BIB [facility] from home d/t a fall that occurred while she was standing in her bathroom "washing up." Pt reports that she felt "swimmy headed" but does not recall if she fell d/t her knees being weak or because she may have lost balance. She denies any memory of hitting her head. EMS reports a deformity to Rt lower leg, she is on plavix, PIV in Lt FA, 140/90, 82 bpm, 99% on baseline 4L home O2. Rates pain 10/10 & is A/Ox4.

## 2021-05-12 NOTE — ED Notes (Signed)
Patient transported to X-ray 

## 2021-05-12 NOTE — Plan of Care (Signed)
Problem: Pain Managment: Goal: General experience of comfort will improve Outcome: Progressing

## 2021-05-12 NOTE — Anesthesia Preprocedure Evaluation (Deleted)
Anesthesia Evaluation    Reviewed: Allergy & Precautions, Patient's Chart, lab work & pertinent test results, Unable to perform ROS - Chart review only  Airway        Dental   Pulmonary pneumonia, COPD, former smoker,  H/O R upper lobe resection for lung CA          Cardiovascular hypertension, Pt. on medications pulmonary hypertension+ angina + Peripheral Vascular Disease and +CHF    Echo 10/2018 1. The left ventricle has hyperdynamic systolic function, with an ejection fraction of >65%. The cavity size was normal. There is moderate asymmetric left ventricular hypertrophy of the septal wall. Left ventricular diastolic Doppler parameters are indeterminate.  2. The right ventricle has mildly reduced systolic function. The cavity was severely enlarged. There is no increase in right ventricular wall thickness. Right ventricular systolic pressure is severely elevated with an estimated pressure of 65.8 mmHg.  3. Small pericardial effusion.  4. No stenosis of the aortic valve.  5. The aorta is normal in size and structure.  6. The aortic root is normal in size and structure.  7. The inferior vena cava was dilated in size with >50% respiratory   RHC 06/16/19: Findings: RA = 9 RV = 54/10 PA =  50/15 (30) PCW = 17 Fick cardiac output/index = 5.1/2.8 Thermo CO/CI = 3.9/2.1 PVR = 2.5 WU (Fick) 3.3 WU (Thermo) FA sat = 99% PA sat = 69%, 66% Assessment: 1. Mild PAH  2. Markedly improved left-sided pressures Plan/Discussion: Ok to proceed with surgery.  Glori Bickers, MD    Neuro/Psych  Headaches, Anxiety TIACVA    GI/Hepatic GERD  Medicated and Controlled,  Endo/Other  diabetes, Type 2, Oral Hypoglycemic Agents  Renal/GU      Musculoskeletal  (+) Arthritis ,   Abdominal   Peds  Hematology  (+) Blood dyscrasia, anemia ,   Anesthesia Other Findings   Reproductive/Obstetrics                              Anesthesia Physical  Anesthesia Plan  ASA: 4  Anesthesia Plan: General   Post-op Pain Management:    Induction: Intravenous  PONV Risk Score and Plan: 3 and Ondansetron, Midazolam, Treatment may vary due to age or medical condition and Dexamethasone  Airway Management Planned: Oral ETT  Additional Equipment:   Intra-op Plan:   Post-operative Plan: Extubation in OR  Informed Consent:   Patient has DNR.     Plan Discussed with:   Anesthesia Plan Comments: (See PAT note written 06/18/2019 by Myra Gianotti, PA-C. )       Anesthesia Quick Evaluation

## 2021-05-12 NOTE — ED Notes (Signed)
Ortho Tech called for Long Leg splint that is ordered.

## 2021-05-12 NOTE — Progress Notes (Signed)
Patient ID: Caitlin Oliver, female   DOB: [birth year], 81 y.o.   MRN: [id number] I have now seen the patient and spoken with her.  She does have a right tib-fib fracture.  I appreciate my partner Dr. Marlou Sa seen her while I was in the operating room at [facility].  I did leave a consult note about her injury as well.  Femoral orthopedic standpoint we are recommending stabilization of her right tib-fib fracture with an intramedullary rod/nail.  We will let her eat today and the plan is putting her on the operating room schedule tomorrow for the surgery.

## 2021-05-12 NOTE — Consult Note (Signed)
Reason for Consult: Right leg pain Referring Physician: Dr. [name] is an 81 y.o. female.  HPI: Caitlin Oliver is an 81 year old patient with right leg pain.  Sustained mechanical fall today.  Has fallen once more about 3 weeks ago and she had 3 stitches placed in her head.  She does use a walker to ambulate.  She lives alone.  She has multiple other medical comorbidities but does take Plavix.  Last took Plavix this morning.  Last ate at 9 AM.  Past Medical History:  Diagnosis Date   Anxiety    Arthritis    Cancer ([place])    lung cancer   Carotid artery disease (HCC)    Cataracts, bilateral    CHF (congestive heart failure) (HCC)    Claudication (HCC)    COPD (chronic obstructive pulmonary disease) (HCC)    Diabetes mellitus    Dizzy    Family history of adverse reaction to anesthesia    Pt nephew has PONV   GERD (gastroesophageal reflux disease)    Headache    History of kidney stones    Hypercholesteremia    Hypertension    Peripheral arterial disease (HCC)    bilateral iliac artery stenosis by angiography   Pneumonia    Stomach ulcer    Stroke ([place])    Tobacco abuse    Wears glasses     Past Surgical History:  Procedure Laterality Date   ABDOMINAL AORTIC ENDOVASCULAR STENT GRAFT Right 06/19/2019   Procedure: INNOMINATE STENT, RIGHT CAROTID STENT, RIGHT SUBCLAVIAN STENT AND CAROTID CUTDOWN;  Surgeon: Marty Heck, MD;  Location: [place];  Service: Vascular;  Laterality: Right;   ABDOMINAL HYSTERECTOMY     APPENDECTOMY     BREAST REDUCTION SURGERY     CAROTID ANGIOGRAM N/A 02/25/2013   Procedure: CAROTID ANGIOGRAM;  Surgeon: Lorretta Harp, MD;  Location: [facility] CATH LAB;  Service: Cardiovascular;  Laterality: N/A;   ENDARTERECTOMY Right 03/06/2013   Procedure: ENDARTERECTOMY CAROTID-RIGHT;  Surgeon: Serafina Mitchell, MD;  Location: [place];  Service: Vascular;  Laterality: Right;   ENDARTERECTOMY Left 05/07/2013   Procedure: LEFT CAROTID ARTERY ENDARTERECTOMY WITH  VASCU-GUARD PATCH ANGIOPLASTY ;  Surgeon: Serafina Mitchell, MD;  Location: [place];  Service: Vascular;  Laterality: Left;   INTRAOPERATIVE ARTERIOGRAM N/A 06/19/2019   Procedure: Clydie Braun;  Surgeon: Marty Heck, MD;  Location: [place];  Service: Vascular;  Laterality: N/A;   IR ANGIO INTRA EXTRACRAN SEL COM CAROTID INNOMINATE UNI R MOD SED  05/14/2019   IR ANGIO VERTEBRAL SEL SUBCLAVIAN INNOMINATE BILAT MOD SED  05/14/2019   LOBECTOMY Left 02/03/2016   Procedure: LEFT UPPER LOBECTOMY;  Surgeon: [name], MD;  Location: [place];  Service: Thoracic;  Laterality: Left;   LOWER EXTREMITY ANGIOGRAM N/A 02/25/2013   Procedure: LOWER EXTREMITY ANGIOGRAM;  Surgeon: Lorretta Harp, MD;  Location: [facility] CATH LAB;  Service: Cardiovascular;  Laterality: N/A;   LOWER EXTREMITY ANGIOGRAM N/A 07/27/2013   Procedure: LOWER EXTREMITY ANGIOGRAM;  Surgeon: Lorretta Harp, MD;  [facility] LAB;  Service: Cardiovascular;  Laterality: N/A;   PATCH ANGIOPLASTY Right 03/06/2013   Procedure: PATCH ANGIOPLASTY of Right Carotid Artery using Vascu-Guard Patch;  Surgeon: Serafina Mitchell, MD;  Location: [place];  Service: Vascular;  Laterality: Right;   PERIPHERAL VASCULAR CATHETERIZATION N/A 11/15/2015   Procedure: Aortic Arch Angiography;  Surgeon: Serafina Mitchell, MD;  Location: [place] CV LAB;  Service: Cardiovascular;  Laterality: N/A;  PERIPHERAL VASCULAR CATHETERIZATION Bilateral 11/15/2015   Procedure: Carotid Angiography;  Surgeon: Serafina Mitchell, MD;  Location: [place] CV LAB;  Service: Cardiovascular;  Laterality: Bilateral;   PERIPHERAL VASCULAR CATHETERIZATION Left 11/15/2015   Procedure: Upper Extremity Angiography;  Surgeon: Serafina Mitchell, MD;  Location: [place] CV LAB;  Service: Cardiovascular;  Laterality: Left;   PERIPHERAL VASCULAR CATHETERIZATION Left 11/15/2015   Procedure: Peripheral Vascular Intervention;  Surgeon: Serafina Mitchell, MD;  Location: [place] CV LAB;   Service: Cardiovascular;  Laterality: Left;  subclavian    RIGHT HEART CATH N/A 06/16/2019   Procedure: RIGHT HEART CATH;  Surgeon: Jolaine Artist, MD;  Location: [place] CV LAB;  Service: Cardiovascular;  Laterality: N/A;   RIGHT/LEFT HEART CATH AND CORONARY ANGIOGRAPHY N/A 11/20/2018   Procedure: RIGHT/LEFT HEART CATH AND CORONARY ANGIOGRAPHY;  Surgeon: Nelva Bush, MD;  Location: [place] CV LAB;  Service: Cardiovascular;  Laterality: N/A;   SALIVARY GLAND SURGERY     scar tissue removed from left saliva glad   TUBAL LIGATION     ULTRASOUND GUIDANCE FOR VASCULAR ACCESS Right 06/19/2019   Procedure: Ultrasound Guidance For Vascular Access;  Surgeon: Marty Heck, MD;  Location: [place];  Service: Vascular;  Laterality: Right;   VIDEO ASSISTED THORACOSCOPY (VATS)/WEDGE RESECTION Left 02/03/2016   Procedure: VIDEO ASSISTED THORACOSCOPY;  Surgeon: [name], MD;  Location: [place];  Service: Thoracic;  Laterality: Left;    Family History  Problem Relation Age of Onset   Stroke Mother    Hypertension Mother    Kidney failure Father    Lung disease Neg Hx     Social History:  reports that she quit smoking about 7 years ago. Her smoking use included cigarettes. She has a 84.00 pack-year smoking history. She quit smokeless tobacco use about 8 years ago. She reports that she does not drink alcohol and does not use drugs.  Allergies:  Allergies  Allergen Reactions   Fentanyl Shortness Of Breath    Medications: I have reviewed the patient's current medications.  Results for orders placed or performed during the hospital encounter of 05/12/21 (from the past 48 hour(s))  CBC with Differential     Status: Abnormal   Collection Time: 05/12/21  1:03 PM  Result Value Ref Range   WBC 10.2 4.0 - 10.5 K/uL   RBC 4.27 3.87 - 5.11 MIL/uL   Hemoglobin 12.8 12.0 - 15.0 g/dL   HCT 39.7 36.0 - 46.0 %   MCV 93.0 80.0 - 100.0 fL   MCH 30.0 26.0 - 34.0 pg   MCHC 32.2 30.0 -  36.0 g/dL   RDW 12.3 11.5 - 15.5 %   Platelets 232 150 - 400 K/uL   nRBC 0.0 0.0 - 0.2 %   Neutrophils Relative % 85 %   Neutro Abs 8.7 (H) 1.7 - 7.7 K/uL   Lymphocytes Relative 7 %   Lymphs Abs 0.7 0.7 - 4.0 K/uL   Monocytes Relative 4 %   Monocytes Absolute 0.4 0.1 - 1.0 K/uL   Eosinophils Relative 3 %   Eosinophils Absolute 0.3 0.0 - 0.5 K/uL   Basophils Relative 0 %   Basophils Absolute 0.0 0.0 - 0.1 K/uL   Immature Granulocytes 1 %   Abs Immature Granulocytes 0.06 0.00 - 0.07 K/uL    Comment: Performed at [facility], [address], [place], [place] [address]  Comprehensive metabolic panel     Status: Abnormal   Collection Time:  05/12/21  1:03 PM  Result Value Ref Range   Sodium 137 135 - 145 mmol/L   Potassium 4.9 3.5 - 5.1 mmol/L   Chloride 96 (L) 98 - 111 mmol/L   CO2 34 (H) 22 - 32 mmol/L   Glucose, Bld 213 (H) 70 - 99 mg/dL    Comment: Glucose reference range applies only to samples taken after fasting for at least 8 hours.   BUN 19 8 - 23 mg/dL   Creatinine, Ser 1.74 (H) 0.44 - 1.00 mg/dL   Calcium 9.1 8.9 - 10.3 mg/dL   Total Protein 6.5 6.5 - 8.1 g/dL   Albumin 3.8 3.5 - 5.0 g/dL   AST 20 15 - 41 U/L   ALT 16 0 - 44 U/L   Alkaline Phosphatase 58 38 - 126 U/L   Total Bilirubin 0.4 0.3 - 1.2 mg/dL   GFR, Estimated 29 (L) >60 mL/min    Comment: (NOTE) Calculated using the CKD-EPI Creatinine Equation (2021)    Anion gap 7 5 - 15    Comment: Performed at [facility] [address] [place], [place] [address]  Protime-INR     Status: None   Collection Time: 05/12/21  1:03 PM  Result Value Ref Range   Prothrombin Time 13.1 11.4 - 15.2 seconds   INR 1.0 0.8 - 1.2    Comment: (NOTE) INR goal varies based on device and disease states. Performed at [facility], [place] [address], [place], [place] [address]   Troponin I (High Sensitivity)     Status: None   Collection Time: 05/12/21  1:03 PM  Result Value Ref Range   Troponin I (High  Sensitivity) 6 <18 ng/L    Comment: (NOTE) Elevated high sensitivity troponin I (hsTnI) values and significant  changes across serial measurements may suggest ACS but many other  chronic and acute conditions are known to elevate hsTnI results.  Refer to the "Links" section for chest pain algorithms and additional  guidance. Performed at [facility], [place] [address], [place], [place] [address]   Troponin I (High Sensitivity)     Status: None   Collection Time: 05/12/21  1:55 PM  Result Value Ref Range   Troponin I (High Sensitivity) 7 <18 ng/L    Comment: (NOTE) Elevated high sensitivity troponin I (hsTnI) values and significant  changes across serial measurements may suggest ACS but many other  chronic and acute conditions are known to elevate hsTnI results.  Refer to the "Links" section for chest pain algorithms and additional  guidance. Performed at [facility], [place] [address], [place], [place] [address]     DG Chest 1 View  Result Date: 05/12/2021 CLINICAL DATA:  Trauma, fall EXAM: CHEST  1 VIEW COMPARISON:  05/05/2021 FINDINGS: There is haziness in the left lower lung fields and blunting of left lateral CP angle suggesting left pleural effusion. Transverse diameter of heart is increased. Thoracic aorta is tortuous. There are no signs of pulmonary edema or focal pulmonary consolidation. There is no pneumothorax. Vascular stents are noted in the courses of major branches of aortic arch. IMPRESSION: Small left pleural effusion. There are no signs of pulmonary edema or focal pulmonary consolidation. Electronically Signed   By: Elmer Picker M.D.   On: 05/12/2021 12:53   DG Pelvis 1-2 Views  Result Date: 05/12/2021 CLINICAL DATA:  Trauma, fall EXAM: PELVIS - 1-2 VIEW COMPARISON:  None. FINDINGS: No displaced fracture or dislocation is seen. Arterial  calcifications are seen. Vascular stents are noted in the common iliac arteries. IMPRESSION: No recent fracture or  dislocation is seen in the pelvis. Electronically Signed   By: Elmer Picker M.D.   On: 05/12/2021 12:51   DG Tibia/Fibula Right  Result Date: 05/12/2021 CLINICAL DATA:  Trauma, fall EXAM: RIGHT TIBIA AND FIBULA - 2 VIEW COMPARISON:  None. FINDINGS: Comminuted spiral fracture is seen in the proximal shaft of fibula. Spiral fracture is seen in the distal shaft of tibia. There is lateral displacement of distal major fracture fragment in the tibia and fibula. IMPRESSION: Displaced fractures are seen in the proximal shaft of right fibula and distal shaft of right tibia. Electronically Signed   By: Elmer Picker M.D.   On: 05/12/2021 12:46   CT HEAD WO CONTRAST (5MM)  Result Date: 05/12/2021 CLINICAL DATA:  Status post fall. EXAM: CT HEAD WITHOUT CONTRAST CT CERVICAL SPINE WITHOUT CONTRAST TECHNIQUE: Multidetector CT imaging of the head and cervical spine was performed following the standard protocol without intravenous contrast. Multiplanar CT image reconstructions of the cervical spine were also generated. RADIATION DOSE REDUCTION: This exam was performed according to the departmental dose-optimization program which includes automated exposure control, adjustment of the mA and/or kV according to patient size and/or use of iterative reconstruction technique. COMPARISON:  May 05, 2021. FINDINGS: CT HEAD FINDINGS Brain: Mild chronic ischemic white matter disease is noted. No mass effect or midline shift is noted. Ventricular size is within normal limits. There is no evidence of mass lesion, hemorrhage or acute infarction. Vascular: No hyperdense vessel or unexpected calcification. Skull: Normal. Negative for fracture or focal lesion. Sinuses/Orbits: No acute finding. Other: None. CT CERVICAL SPINE FINDINGS Alignment: Minimal grade 1 retrolisthesis of C5-6 is noted secondary to severe degenerative disc disease at this level. Skull base and vertebrae: No acute fracture. No primary bone lesion or  focal pathologic process. Soft tissues and spinal canal: No prevertebral fluid or swelling. No visible canal hematoma. Disc levels:  Severe degenerative disc disease is noted at C5-6. Upper chest: Negative. Other: None. IMPRESSION: No acute intracranial abnormality seen. Severe degenerative disc disease noted at C5-6. No acute abnormality seen in the cervical spine. Electronically Signed   By: Marijo Conception M.D.   On: 05/12/2021 13:53   CT Cervical Spine Wo Contrast  Result Date: 05/12/2021 CLINICAL DATA:  Status post fall. EXAM: CT HEAD WITHOUT CONTRAST CT CERVICAL SPINE WITHOUT CONTRAST TECHNIQUE: Multidetector CT imaging of the head and cervical spine was performed following the standard protocol without intravenous contrast. Multiplanar CT image reconstructions of the cervical spine were also generated. RADIATION DOSE REDUCTION: This exam was performed according to the departmental dose-optimization program which includes automated exposure control, adjustment of the mA and/or kV according to patient size and/or use of iterative reconstruction technique. COMPARISON:  May 05, 2021. FINDINGS: CT HEAD FINDINGS Brain: Mild chronic ischemic white matter disease is noted. No mass effect or midline shift is noted. Ventricular size is within normal limits. There is no evidence of mass lesion, hemorrhage or acute infarction. Vascular: No hyperdense vessel or unexpected calcification. Skull: Normal. Negative for fracture or focal lesion. Sinuses/Orbits: No acute finding. Other: None. CT CERVICAL SPINE FINDINGS Alignment: Minimal grade 1 retrolisthesis of C5-6 is noted secondary to severe degenerative disc disease at this level. Skull base and vertebrae: No acute fracture. No primary bone lesion or focal pathologic process. Soft tissues and spinal canal: No prevertebral fluid or swelling. No visible canal hematoma. Disc levels:  Severe  degenerative disc disease is noted at C5-6. Upper chest: Negative. Other:  None. IMPRESSION: No acute intracranial abnormality seen. Severe degenerative disc disease noted at C5-6. No acute abnormality seen in the cervical spine. Electronically Signed   By: Marijo Conception M.D.   On: 05/12/2021 13:53   DG Foot Complete Right  Result Date: 05/12/2021 CLINICAL DATA:  Trauma, fall EXAM: RIGHT FOOT COMPLETE - 3+ VIEW COMPARISON:  None. FINDINGS: No fracture or dislocation is seen. IMPRESSION: No fracture or dislocation is seen in the right foot. Electronically Signed   By: Elmer Picker M.D.   On: 05/12/2021 12:49   DG Femur Min 2 Views Right  Result Date: 05/12/2021 CLINICAL DATA:  Trauma, fall EXAM: RIGHT FEMUR 2 VIEWS COMPARISON:  None. FINDINGS: No recent fracture or dislocation is seen. Arterial calcifications are seen in the soft tissues. IMPRESSION: No fracture or dislocation is seen in the right femur. Electronically Signed   By: Elmer Picker M.D.   On: 05/12/2021 12:50    Review of Systems  Musculoskeletal:  Positive for arthralgias.  All other systems reviewed and are negative. Blood pressure (!) 120/51, pulse (!) 51, temperature (!) 97.4 F (36.3 C), temperature source Oral, resp. rate 17, SpO2 100 %. Physical Exam Vitals reviewed.  HENT:     Head: Normocephalic.     Nose: Nose normal.     Mouth/Throat:     Mouth: Mucous membranes are moist.  Eyes:     Pupils: Pupils are equal, round, and reactive to light.  Cardiovascular:     Rate and Rhythm: Normal rate.  Pulmonary:     Effort: Pulmonary effort is normal.  Abdominal:     General: Abdomen is flat.  Musculoskeletal:     Cervical back: Normal range of motion.  Skin:    General: Skin is warm.     Capillary Refill: Capillary refill takes less than 2 seconds.  Neurological:     General: No focal deficit present.     Mental Status: She is alert.  Psychiatric:        Mood and Affect: Mood normal.  Examination of bilateral upper extremities demonstrates good range of motion of wrist  elbows and shoulders without crepitus or swelling.  Grip strength intact bilaterally.  Neck range of motion also reasonable turning side to side.  Left lower extremity has no knee effusion.  No groin pain with internal or external rotation of that left leg.  Pedal pulses trace palpable bilaterally.  Ankle dorsiflexion plantarflexion intact on the left-hand side.  On the right-hand side the patient also has intact toe flexion and extension as well as ankle flexion and extension.  No real pain with passive flexion extension of the toes or ankle.  Patient does have expected amount of swelling but compartments are not overtly hard.  Patient does not appear to be in excessive pain and carries on a conversation normally.  No knee effusion on the right.  No groin pain on the right which is new.  Assessment/Plan: Impression is closed right tib-fib fracture in a patient with Plavix.  No indication of compartment syndrome at this time but this will need to be monitored for increasing pain medicine requirements and increasing compartment pressure.  Plan for surgery either later tonight per Dr. Ninfa Linden or potentially in the morning.  Long-leg splint applied.  The risk benefits of surgery are discussed with the patient including not limited to infection nerve vessel damage delayed healing as well as potential need for  more surgery.  I do think operative treatment for this patient would be better than nonoperative treatment.  Caitlin Oliver 05/12/2021, 3:46 PM

## 2021-05-12 NOTE — H&P (Addendum)
[facility] Admission History and Physical Service Pager: [phone]  Patient name: Caitlin Oliver Medical record number: [id number] Date of birth: [birth year] Age: 81 y.o. Gender: female  Primary Care Provider: [name], PA-C Consultants: Orthopedic surgery Code Status: DNR  Preferred Emergency Contact: Prince Solian (niece): [phone]   Chief Complaint: Fall  Assessment and Plan: Caitlin Oliver is a 81 y.o. female presenting after an unwitnessed fall at home. PMH is significant for CVA, CHF, T2DM, HTN, HLD, pulmonary HTN, and COPD on 3L at baseline.  Right tibia and fibula closed fracture 2/2 fall from standing position Patient stable and conversant in exam room with pain overall controlled with morphine. On admission to ED, she was afebrile and mildly tachypneic. Exam demonstrated swollen R lower extremity with intermittent bruising and mild erythema that was severely painful to palpation but not passive foot flexion; no evidence of compartment syndrome appreciated. Labs were notable for normal WBC and Hgb as well as flat troponins. ECG was negative for ACS. Tib/fib XR was positive for displaced fractures in proximal R fibula and distal R tibia. CXR demonstrated small left pleural effusion, and CT head showed no acute intracranial abnormality. Orthopedic surgery was consulted and plan for surgery tomorrow (1/28) and placement of long leg splint until then. Fall could be secondary to age-related decreases in mobility and joint integrity; orthostatic hypotension given her reports of low BP and dizziness when sitting up fast; her chronic history of dizziness likely 2/2 previous CVA and posterior circulation stenosis; BPPV; cardiac dysfunction or arrythmia given history of CHF, PAH, and PAD though troponins were negative and ECG normal; medication side effect given she is prescribed sedating medications like trazodone, diazepam, librax, and norco and BP lowering  medications such as torsemide, metoprolol, and metolazone; metabolic encephalopathy 2/2 infection though she is afebrile with a normal WBC; seizure though no history of seizures or evidence of tongue biting or postictal state; and stroke thought CT head negative and no focal deficits on exam. Will admit for further workup and surgery. -Admit to med-surg, attending Dr. Thompson Grayer -F/u procedure by orthopedic surgery -Vitals per floor -Hold plavix -NPO at midnight -UA pending to rule out UTI -Tylenol 63m q 6 -Oxy [id number] prn  -Monitor compartment syndrome and increasing pain requirements  -Repeat echo after surgery to assess cardiac structural dysfunction -Orthostatic vitals and PT/OT consult once mobile after surgery  Pulmonary HTN, Group 2-3 Patient is currently seen by Dr. DeShearon Stallsith [facility]. It appears her condition is stable, and she is on torsemide and metolazone for this. She was euvolemic on exam at her last visit, though she appears volume down on exam today with markedly dry mucus membranes. Will give half maintenance fluids to better hydrate and monitor symptoms. -Half mIVF NS 60 mL/hr -Hold diuretics for now -Monitor clinical status  Chronic diastolic HF  Patient is currently followed [facility] revealed EF >65% with small pericardial effusion. She is prescribed metoprolol tartrate 50 mg BID and torsemide 20 mg daily. She appears volume depleted on exam with dry mucus membranes; therefore, will hold diuretics and start half maintenance fluids. She also has soft pulses at 49-65, so will also hold metoprolol at this time. -Hold medications -Repeat echo as above -Continue to monitor  CKD stage 4 Patient's Cr on admission is 1.74. It appears her Cr has ranged 1.64-2.37 and GFR 20-31 over the last 6 months, though there is no record of nephrology consultation  or diagnosis of CKD. Given her chronic dysfunction, suspect she has CKD. Will follow  surgical management of her acute fracture and discuss diagnosis. -Follow BMP -Half mIVF 60 mL/hr  -Recommend nephrology follow up outpatient  Chronic hypoxic respiratory failure 2/2 COPD GOLD 2   Chronic chest pain Patient is on 3L of O2 at baseline. She has a history of lung cancer. She endorsed chronic chest pain on exam today that was like a pressure. She follows with Dr. Shearon Stalls with [facility] for pulmonary HTN, who mentions chronic chest pain could be secondary to worsening COPD vs continued deconditioning. Given that the pain is chronic and troponins and ECG on admission both negative for acute ACS, do not suspect cardiac cause for chest pain and suspect it's also due to progression of COPD. Last PFTs were in 02/2019 and demonstrated 62% FEV1 down from 84% FEV1 in 2018. Will continue home meds. -O2 3L [place] -Albuterol 108 mcg inhaler 2 puffs prn -Monitor respiratory status   History of TIA, CVA   Severe PAD   HLD Patient has history of CVA in 2020. She has had multiple surgeries, including bilateral endarterectomies and innominate artery stenting by Dr. Carlis Abbott with [facility]. She also has a history of subclavian steal syndrome. She is currently stable on pravastatin 20 mg daily and plavix 75 mg daily. -Continue statin -Hold plavix given upcoming surgery  T2DM with neuropathy Patient's last Hgb A1c on 05/04/21 was 7.1. She is prescribed metformin 500 mg BID and gabapentin 300 mg daily. -Continue medications  Abdominal pain and constipation Patient takes librax 5-2.5 mg BID, colace 100 mg BID prn for constipation, bentyl 20 mg daily for spasms, and miralax 17 g daily. Given librax contains chlordiazepoxide, will hold for now given workup of fall. -Continue bentyl, and miralax prn  GERD   Depression   Anxiety Patient is prescribed pantoprazole 40 mg BID for GERD and fluoxetine 20 mg daily for depression. She is also taking valium 5 mg Q8H prn anxiety. -Continue pantoprazole, fluoxetine -Hold  valium given sedating effects  FEN/GI: NPO, s/p LR 500 mL bolus, half mIVF 60 mL/hr Prophylaxis: None given surgery  Disposition: Med-surg  History of Present Illness:  Caitlin Oliver is a 81 y.o. female presenting with an unwitnessed fall at home.  Patient was washing herself at the sink and states she was standing for too long and fell from a standing position. She feels that if she stands for too long, her "knees give out." She denies dizziness or vision changes before fall. Does endorse having balance difficulties at baseline and uses a rollator walker to ambulate throughout the home. She lives alone and does not have stairs or rugs in the home. Denies going from sitting to standing prior to fall. Denies head injury but does endorse hitting her head 1 week ago after a fall on a rollator and needed stitches. Endorses about 8 falls in last 2 and a half months without fractures.  Patient also endorses chest pain for about 2 months that she has "spoken to multiple doctors about." Denies palpitations, increased SOB, N/V, fever, chills, constipation, diarrhea, dysuria, loss of lower extremity sensation. Does endorse chronic generalized weakness and episodes of low blood pressure at home to 90s/40s recently.  Review Of Systems: Per HPI.  Patient Active Problem List   Diagnosis Date Noted   Abdominal pain 12/08/2019   Acute on chronic heart failure with preserved ejection fraction (HFpEF) ([place])    Unstable angina ([place])    Acute  on chronic combined systolic and diastolic CHF (congestive heart failure) ([place]) 11/13/2018   Acute CVA (cerebrovascular accident) ([place]) 06/17/2018   Syncope 06/16/2018   Chest pain 06/16/2018   Pain 09/23/2017   Cervicalgia 07/16/2017   Pain in both upper extremities 06/03/2017   Pain in both lower extremities 06/03/2017   Bilateral carotid artery stenosis 06/03/2017   Benign paroxysmal positional vertigo 10/23/2016   Headache 06/19/2016   Cancer of left lung ([place])  02/03/2016   COPD GOLD II 12/08/2015   Solitary pulmonary nodule 12/08/2015   Bilateral occipital neuralgia 11/22/2015   Double vision 10/28/2015   TIA (transient ischemic attack) 10/28/2015   Subclavian artery stenosis, left (HCC) 10/28/2015   Subclavian steal syndrome 10/28/2015   Acute blood loss anemia 10/20/2013   C. difficile colitis 10/19/2013   GI bleed 10/18/2013   COPD 10/18/2013   Bleeding gastrointestinal 10/18/2013   Hypoxia 10/10/2013   Bronchitis 10/08/2013   PVD (peripheral vascular disease) with claudication ([place]) 07/29/2013   Carotid stenosis 05/07/2013   Stenosis of left carotid artery 05/07/2013   Occlusion and stenosis of carotid artery without mention of cerebral infarction 03/02/2013   Carotid artery obstruction 03/02/2013   Carotid artery disease ([place]) 02/27/2013   Tobacco abuse 02/27/2013   Compulsive tobacco user syndrome 02/27/2013   Claudication ([place]) 01/20/2013   Essential hypertension 01/20/2013   Type 2 diabetes mellitus ([place]) 01/20/2013   Hyperlipidemia 01/20/2013   Past Medical History: Past Medical History:  Diagnosis Date   Anxiety    Arthritis    Cancer ([place])    lung cancer   Carotid artery disease ([place])    Cataracts, bilateral    CHF (congestive heart failure) ([place])    Claudication ([place])    COPD (chronic obstructive pulmonary disease) ([place])    Diabetes mellitus    Dizzy    Family history of adverse reaction to anesthesia    Pt nephew has PONV   GERD (gastroesophageal reflux disease)    Headache    History of kidney stones    Hypercholesteremia    Hypertension    Peripheral arterial disease ([place])    bilateral iliac artery stenosis by angiography   Pneumonia    Stomach ulcer    Stroke ([place])    Tobacco abuse    Wears glasses    Past Surgical History: Past Surgical History:  Procedure Laterality Date   ABDOMINAL AORTIC ENDOVASCULAR STENT GRAFT Right 06/19/2019   Procedure: INNOMINATE STENT, RIGHT CAROTID STENT, RIGHT  SUBCLAVIAN STENT AND CAROTID CUTDOWN;  Surgeon: Marty Heck, MD;  Location: [facility];  Service: Vascular;  Laterality: Right;   ABDOMINAL HYSTERECTOMY     APPENDECTOMY     BREAST REDUCTION SURGERY     CAROTID ANGIOGRAM N/A 02/25/2013   Procedure: CAROTID ANGIOGRAM;  Surgeon: Lorretta Harp, MD;  Location: [facility] CATH LAB;  Service: Cardiovascular;  Laterality: N/A;   ENDARTERECTOMY Right 03/06/2013   Procedure: ENDARTERECTOMY CAROTID-RIGHT;  Surgeon: Serafina Mitchell, MD;  Location: [facility];  Service: Vascular;  Laterality: Right;   ENDARTERECTOMY Left 05/07/2013   Procedure: LEFT CAROTID ARTERY ENDARTERECTOMY WITH VASCU-GUARD PATCH ANGIOPLASTY ;  Surgeon: Serafina Mitchell, MD;  Location: [place] [facility];  Service: Vascular;  Laterality: Left;   INTRAOPERATIVE ARTERIOGRAM N/A 06/19/2019   Procedure: Clydie Braun;  Surgeon: Marty Heck, MD;  Location: [facility] OR;  Service: Vascular;  Laterality: N/A;   IR ANGIO INTRA EXTRACRAN SEL COM CAROTID INNOMINATE UNI R MOD SED  05/14/2019   IR ANGIO VERTEBRAL  SEL SUBCLAVIAN INNOMINATE BILAT MOD SED  05/14/2019   LOBECTOMY Left 02/03/2016   Procedure: LEFT UPPER LOBECTOMY;  Surgeon: [name], MD;  Location: [facility];  Service: Thoracic;  Laterality: Left;   LOWER EXTREMITY ANGIOGRAM N/A 02/25/2013   Procedure: LOWER EXTREMITY ANGIOGRAM;  Surgeon: Lorretta Harp, MD;  Location: [facility] CATH LAB;  Service: Cardiovascular;  Laterality: N/A;   LOWER EXTREMITY ANGIOGRAM N/A 07/27/2013   Procedure: LOWER EXTREMITY ANGIOGRAM;  Surgeon: Lorretta Harp, MD;  [facility];  Service: Cardiovascular;  Laterality: N/A;   PATCH ANGIOPLASTY Right 03/06/2013   Procedure: PATCH ANGIOPLASTY of Right Carotid Artery using Vascu-Guard Patch;  Surgeon: Serafina Mitchell, MD;  Location: [place];  Service: Vascular;  Laterality: Right;   PERIPHERAL VASCULAR CATHETERIZATION N/A 11/15/2015   Procedure: Aortic Arch Angiography;  Surgeon: Serafina Mitchell, MD;  Location: [facility]  CV LAB;  Service: Cardiovascular;  Laterality: N/A;   PERIPHERAL VASCULAR CATHETERIZATION Bilateral 11/15/2015   Procedure: Carotid Angiography;  Surgeon: Serafina Mitchell, MD;  Location: [place] [facility] CV LAB;  Service: Cardiovascular;  Laterality: Bilateral;   PERIPHERAL VASCULAR CATHETERIZATION Left 11/15/2015   Procedure: Upper Extremity Angiography;  Surgeon: Serafina Mitchell, MD;  Location: [place] CV LAB;  Service: Cardiovascular;  Laterality: Left;   PERIPHERAL VASCULAR CATHETERIZATION Left 11/15/2015   Procedure: Peripheral Vascular Intervention;  Surgeon: Serafina Mitchell, MD;  Location: [facility] CV LAB;  Service: Cardiovascular;  Laterality: Left;  subclavian    RIGHT HEART CATH N/A 06/16/2019   Procedure: RIGHT HEART CATH;  Surgeon: Jolaine Artist, MD;  Location: [facility] CV LAB;  Service: Cardiovascular;  Laterality: N/A;   RIGHT/LEFT HEART CATH AND CORONARY ANGIOGRAPHY N/A 11/20/2018   Procedure: RIGHT/LEFT HEART CATH AND CORONARY ANGIOGRAPHY;  Surgeon: Nelva Bush, MD;  Location: [facility] CV LAB;  Service: Cardiovascular;  Laterality: N/A;   SALIVARY GLAND SURGERY     scar tissue removed from left saliva glad   TUBAL LIGATION     ULTRASOUND GUIDANCE FOR VASCULAR ACCESS Right 06/19/2019   Procedure: Ultrasound Guidance For Vascular Access;  Surgeon: Marty Heck, MD;  Location: [facility];  Service: Vascular;  Laterality: Right;   VIDEO ASSISTED THORACOSCOPY (VATS)/WEDGE RESECTION Left 02/03/2016   Procedure: VIDEO ASSISTED THORACOSCOPY;  Surgeon: [name], MD;  [facility];  Service: Thoracic;  Laterality: Left;   Social History: Social History   Tobacco Use   Smoking status: Former    Packs/day: 1.50    Years: 56.00    Pack years: 84.00    Types: Cigarettes    Quit date: 07/07/2013    Years since quitting: 7.8   Smokeless tobacco: Former    Quit date: 02/25/2013  Vaping Use   Vaping Use: Some days  Substance Use Topics   Alcohol use: No    Drug use: No   Family History: Family History  Problem Relation Age of Onset   Stroke Mother    Hypertension Mother    Kidney failure Father    Lung disease Neg Hx    Allergies and Medications: Allergies  Allergen Reactions   Fentanyl Shortness Of Breath   No current facility-administered medications on file prior to encounter.   Current Outpatient Medications on File Prior to Encounter  Medication Sig Dispense Refill   acetaminophen (TYLENOL) 500 MG tablet Take 500 mg by mouth every 6 (six) hours as needed for moderate pain.     albuterol (PROVENTIL HFA;VENTOLIN HFA) 108 (90  Base) MCG/ACT inhaler Inhale 2 puffs into the lungs every 6 (six) hours as needed for wheezing or shortness of breath.      albuterol (PROVENTIL) (2.5 MG/3ML) 0.083% nebulizer solution Take 2.5 mg by nebulization in the morning and at bedtime.  (Patient not taking: Reported on 04/03/2021)     Ascorbic Acid (VITAMIN C) 1000 MG tablet Take 1,000 mg by mouth daily.     Biotin 1000 MCG tablet Take 1,000 mcg by mouth daily.     clidinium-chlordiazePOXIDE (LIBRAX) 5-2.5 MG capsule Take 1 capsule by mouth 2 (two) times daily.     clopidogrel (PLAVIX) 75 MG tablet Take 1 tablet (75 mg total) by mouth daily.     diazepam (VALIUM) 5 MG tablet Take 5 mg by mouth every 8 (eight) hours as needed for anxiety.     dicyclomine (BENTYL) 20 MG tablet Take 20 mg by mouth daily as needed for spasms.     docusate sodium (COLACE) 100 MG capsule Take 100 mg by mouth 2 (two) times daily as needed for mild constipation.     ferrous sulfate 325 (65 FE) MG tablet Take 325 mg by mouth daily with breakfast.     FLUoxetine (PROZAC) 20 MG capsule Take 20 mg by mouth daily.     gabapentin (NEURONTIN) 300 MG capsule Take 300 mg by mouth at bedtime.     HYDROcodone-acetaminophen (NORCO/VICODIN) 5-325 MG tablet Take 1 tablet by mouth every 6 (six) hours as needed for moderate pain. 15 tablet 0   Magnesium 250 MG TABS Take 250 mg by mouth daily.      metFORMIN (GLUCOPHAGE) 500 MG tablet Take by mouth 2 (two) times daily with a meal.     metolazone (ZAROXOLYN) 5 MG tablet Take 5 mg by mouth as needed.     metoprolol tartrate (LOPRESSOR) 50 MG tablet Take 50 mg by mouth 2 (two) times daily.     naphazoline-glycerin (CLEAR EYES REDNESS) 0.012-0.25 % SOLN 1-2 drops 4 (four) times daily as needed for eye irritation.     ondansetron (ZOFRAN) 4 MG tablet Take 4 mg by mouth at bedtime as needed for nausea or vomiting.      ONE TOUCH ULTRA TEST test strip      OXYGEN Inhale into the lungs. 3 liters n/c     pantoprazole (PROTONIX) 40 MG tablet Take 40 mg by mouth 2 (two) times daily.      polyethylene glycol (MIRALAX / GLYCOLAX) 17 g packet Take 17 g by mouth daily as needed for mild constipation.     potassium chloride SA (KLOR-CON M) 20 MEQ tablet Take 20 mEq by mouth daily.     pravastatin (PRAVACHOL) 40 MG tablet Take 20 mg by mouth daily before supper.      revefenacin (YUPELRI) 175 MCG/3ML nebulizer solution Take 3 mLs (175 mcg total) by nebulization daily. 30 mL 5   torsemide (DEMADEX) 20 MG tablet Take 2 tablets (40 mg total) by mouth twice a day 360 tablet 0   traZODone (DESYREL) 50 MG tablet Take 50 mg by mouth at bedtime.     Objective: BP 132/75    Pulse (!) 112    Temp (!) 97.4 F (36.3 C) (Oral)    Resp 20    SpO2 100%  Exam: General: Lying in bed with intermittent pain in leg, pleasant and conversant Eyes: Sclera anicteric ENTM: Torus palatinus visualized, markedly dry mucus membranes Cardiovascular: RRR, normal S1/S2, no m/r/g Respiratory: CTAB Gastrointestinal: Soft, normoactive bowel sounds,  nondistended, nontender MSK: R leg swollen, mildly erythematous, painful to palpation but can passively flex foot and toes; intermittent bruising around mid shin Neuro: No gross focal deficits, moves upper extremities grossly equally Psych: Alert and oriented, normal mood and affect  Labs and Imaging: CBC BMET  Recent Labs  Lab  05/12/21 1303  WBC 10.2  HGB 12.8  HCT 39.7  PLT 232   Recent Labs  Lab 05/12/21 1303  NA 137  K 4.9  CL 96*  CO2 34*  BUN 19  CREATININE 1.74*  GLUCOSE 213*  CALCIUM 9.1     EKG: My own interpretation (not copied from electronic read): sinus rhythm   DG tibia/fibula R: IMPRESSION: Displaced fractures are seen in the proximal shaft of right fibula and distal shaft of right tibia.  DG chest: IMPRESSION: Small left pleural effusion. There are no signs of pulmonary edema or focal pulmonary consolidation.  DG R foot, femur; DG pelvis: negative.  CT head, CT cervical spine: IMPRESSION: No acute intracranial abnormality seen. Severe degenerative disc disease noted at C5-6. No acute abnormality seen in the cervical spine.  Signed: Mikal Plane, Medical Student 05/12/2021, 3:10 PM [place] Medical Student Pager: [phone]   I was personally present and performed or re-performed the history, physical exam and medical decision making activities of this service and have verified that the service and findings are accurately documented in the students note.  Shary Key, DO                  05/12/2021, 6:02 PM

## 2021-05-13 ENCOUNTER — Encounter (HOSPITAL_COMMUNITY): Payer: Self-pay | Admitting: Family Medicine

## 2021-05-13 ENCOUNTER — Inpatient Hospital Stay (HOSPITAL_COMMUNITY): Payer: PPO

## 2021-05-13 DIAGNOSIS — I2729 Other secondary pulmonary hypertension: Secondary | ICD-10-CM | POA: Diagnosis not present

## 2021-05-13 DIAGNOSIS — Z66 Do not resuscitate: Secondary | ICD-10-CM

## 2021-05-13 DIAGNOSIS — J9612 Chronic respiratory failure with hypercapnia: Secondary | ICD-10-CM

## 2021-05-13 DIAGNOSIS — J441 Chronic obstructive pulmonary disease with (acute) exacerbation: Secondary | ICD-10-CM | POA: Diagnosis not present

## 2021-05-13 DIAGNOSIS — A419 Sepsis, unspecified organism: Secondary | ICD-10-CM

## 2021-05-13 DIAGNOSIS — J9611 Chronic respiratory failure with hypoxia: Secondary | ICD-10-CM | POA: Diagnosis not present

## 2021-05-13 DIAGNOSIS — R6521 Severe sepsis with septic shock: Secondary | ICD-10-CM

## 2021-05-13 LAB — CBC
HCT: 34.4 % — ABNORMAL LOW (ref 36.0–46.0)
HCT: 35.4 % — ABNORMAL LOW (ref 36.0–46.0)
Hemoglobin: 11.3 g/dL — ABNORMAL LOW (ref 12.0–15.0)
Hemoglobin: 11.6 g/dL — ABNORMAL LOW (ref 12.0–15.0)
MCH: 30.5 pg (ref 26.0–34.0)
MCH: 30.9 pg (ref 26.0–34.0)
MCHC: 32.8 g/dL (ref 30.0–36.0)
MCHC: 32.8 g/dL (ref 30.0–36.0)
MCV: 92.7 fL (ref 80.0–100.0)
MCV: 94.1 fL (ref 80.0–100.0)
Platelets: 214 10*3/uL (ref 150–400)
Platelets: 218 10*3/uL (ref 150–400)
RBC: 3.71 MIL/uL — ABNORMAL LOW (ref 3.87–5.11)
RBC: 3.76 MIL/uL — ABNORMAL LOW (ref 3.87–5.11)
RDW: 12.3 % (ref 11.5–15.5)
RDW: 12.2 % (ref 11.5–15.5)
WBC: 12.4 10*3/uL — ABNORMAL HIGH (ref 4.0–10.5)
WBC: 17.9 10*3/uL — ABNORMAL HIGH (ref 4.0–10.5)
nRBC: 0 % (ref 0.0–0.2)
nRBC: 0 % (ref 0.0–0.2)

## 2021-05-13 LAB — URINALYSIS, ROUTINE W REFLEX MICROSCOPIC
Bilirubin Urine: NEGATIVE
Glucose, UA: NEGATIVE mg/dL
Hgb urine dipstick: NEGATIVE
Ketones, ur: NEGATIVE mg/dL
Leukocytes,Ua: NEGATIVE
Nitrite: NEGATIVE
Protein, ur: NEGATIVE mg/dL
Specific Gravity, Urine: 1.006 (ref 1.005–1.030)
pH: 6 (ref 5.0–8.0)

## 2021-05-13 LAB — BASIC METABOLIC PANEL
Anion gap: 9 (ref 5–15)
BUN: 20 mg/dL (ref 8–23)
CO2: 33 mmol/L — ABNORMAL HIGH (ref 22–32)
Calcium: 8.9 mg/dL (ref 8.9–10.3)
Chloride: 96 mmol/L — ABNORMAL LOW (ref 98–111)
Creatinine, Ser: 1.78 mg/dL — ABNORMAL HIGH (ref 0.44–1.00)
GFR, Estimated: 29 mL/min — ABNORMAL LOW (ref >60)
Glucose, Bld: 188 mg/dL — ABNORMAL HIGH (ref 70–99)
Potassium: 5.1 mmol/L (ref 3.5–5.1)
Sodium: 138 mmol/L (ref 135–145)

## 2021-05-13 LAB — GLUCOSE, CAPILLARY
Glucose-Capillary: 145 mg/dL — ABNORMAL HIGH (ref 70–99)
Glucose-Capillary: 168 mg/dL — ABNORMAL HIGH (ref 70–99)
Glucose-Capillary: 167 mg/dL — ABNORMAL HIGH (ref 70–99)
Glucose-Capillary: 141 mg/dL — ABNORMAL HIGH (ref 70–99)
Glucose-Capillary: 152 mg/dL — ABNORMAL HIGH (ref 70–99)
Glucose-Capillary: 154 mg/dL — ABNORMAL HIGH (ref 70–99)

## 2021-05-13 LAB — SURGICAL PCR SCREEN
MRSA, PCR: NEGATIVE
Staphylococcus aureus: NEGATIVE

## 2021-05-13 LAB — HEMOGLOBIN A1C
Hgb A1c MFr Bld: 7 % — ABNORMAL HIGH (ref 4.8–5.6)
Mean Plasma Glucose: 154.2 mg/dL

## 2021-05-13 MED ORDER — BUPIVACAINE-EPINEPHRINE 0.5% -1:200000 IJ SOLN
INTRAMUSCULAR | Status: AC
  Filled 2021-05-13: qty 1

## 2021-05-13 MED ORDER — FENTANYL CITRATE (PF) 250 MCG/5ML IJ SOLN
INTRAMUSCULAR | Status: AC
  Filled 2021-05-13: qty 5

## 2021-05-13 MED ORDER — ACETAMINOPHEN 650 MG RE SUPP: 650.0000 mg | Freq: Four times a day (QID) | RECTAL | Status: DC | PRN

## 2021-05-13 MED ORDER — CEFAZOLIN SODIUM-DEXTROSE 2-4 GM/100ML-% IV SOLN
2.0000 g | INTRAVENOUS | Status: DC
  Filled 2021-05-13: qty 100

## 2021-05-13 MED ORDER — NOREPINEPHRINE 4 MG/250ML-% IV SOLN
2.0000 ug/min | INTRAVENOUS | Status: DC
  Administered 2021-05-14: 2 ug/min via INTRAVENOUS
  Filled 2021-05-13 (×2): qty 250

## 2021-05-13 MED ORDER — HYDROMORPHONE HCL 1 MG/ML IJ SOLN
0.5000 mg | INTRAMUSCULAR | Status: DC | PRN
  Administered 2021-05-13 – 2021-05-25 (×25): 0.5 mg via INTRAVENOUS
  Filled 2021-05-13 (×27): qty 0.5

## 2021-05-13 MED ORDER — PANTOPRAZOLE SODIUM 40 MG PO TBEC
40.0000 mg | DELAYED_RELEASE_TABLET | Freq: Two times a day (BID) | ORAL | Status: DC
  Filled 2021-05-13: qty 1

## 2021-05-13 MED ORDER — PROPOFOL 10 MG/ML IV BOLUS
INTRAVENOUS | Status: AC
  Filled 2021-05-13: qty 20

## 2021-05-13 MED ORDER — VANCOMYCIN HCL IN DEXTROSE 1-5 GM/200ML-% IV SOLN: 1000.0000 mg | INTRAVENOUS | Status: DC

## 2021-05-13 MED ORDER — SODIUM CHLORIDE 0.9 % IV SOLN: 250.0000 mL | INTRAVENOUS | Status: DC

## 2021-05-13 MED ORDER — POVIDONE-IODINE 10 % EX SWAB
2.0000 "application " | Freq: Once | CUTANEOUS | Status: AC
  Administered 2021-05-13: 2 via TOPICAL

## 2021-05-13 MED ORDER — CHLORHEXIDINE GLUCONATE CLOTH 2 % EX PADS
6.0000 | MEDICATED_PAD | Freq: Every day | CUTANEOUS | Status: DC
  Administered 2021-05-13 – 2021-05-27 (×15): 6 via TOPICAL

## 2021-05-13 MED ORDER — VANCOMYCIN HCL IN DEXTROSE 1-5 GM/200ML-% IV SOLN
1000.0000 mg | INTRAVENOUS | Status: AC
Start: 2021-05-13 — End: 2021-05-13
  Administered 2021-05-13: 1000 mg via INTRAVENOUS
  Filled 2021-05-13: qty 200

## 2021-05-13 MED ORDER — ACETAMINOPHEN 10 MG/ML IV SOLN
INTRAVENOUS | Status: AC
  Filled 2021-05-13: qty 100

## 2021-05-13 MED ORDER — CHLORHEXIDINE GLUCONATE 4 % EX LIQD
60.0000 mL | Freq: Once | CUTANEOUS | Status: DC
  Filled 2021-05-13: qty 60

## 2021-05-13 MED ORDER — BUPIVACAINE-EPINEPHRINE (PF) 0.25% -1:200000 IJ SOLN
INTRAMUSCULAR | Status: AC
  Filled 2021-05-13: qty 30

## 2021-05-13 MED ORDER — CHLORHEXIDINE GLUCONATE 0.12 % MT SOLN
OROMUCOSAL | Status: AC
  Administered 2021-05-13: 15 mL
  Filled 2021-05-13: qty 15

## 2021-05-13 MED ORDER — NOREPINEPHRINE 4 MG/250ML-% IV SOLN
2.0000 ug/min | INTRAVENOUS | Status: DC
  Administered 2021-05-13: 2 ug/min via INTRAVENOUS

## 2021-05-13 MED ORDER — SODIUM CHLORIDE 0.9 % IV SOLN
2.0000 g | INTRAVENOUS | Status: AC
  Administered 2021-05-13 – 2021-05-17 (×5): 2 g via INTRAVENOUS
  Filled 2021-05-13 (×5): qty 20

## 2021-05-13 MED ORDER — HEPARIN SODIUM (PORCINE) 5000 UNIT/ML IJ SOLN
5000.0000 [IU] | Freq: Three times a day (TID) | INTRAMUSCULAR | Status: DC
  Administered 2021-05-13 – 2021-05-22 (×29): 5000 [IU] via SUBCUTANEOUS
  Filled 2021-05-13 (×29): qty 1

## 2021-05-13 MED ORDER — INSULIN ASPART 100 UNIT/ML IJ SOLN
0.0000 [IU] | INTRAMUSCULAR | Status: DC
  Administered 2021-05-13 (×3): 2 [IU] via SUBCUTANEOUS
  Administered 2021-05-13 – 2021-05-14 (×4): 1 [IU] via SUBCUTANEOUS
  Administered 2021-05-14: 2 [IU] via SUBCUTANEOUS
  Administered 2021-05-14: 1 [IU] via SUBCUTANEOUS
  Administered 2021-05-14: 2 [IU] via SUBCUTANEOUS
  Administered 2021-05-15 (×2): 1 [IU] via SUBCUTANEOUS
  Administered 2021-05-15: 2 [IU] via SUBCUTANEOUS
  Administered 2021-05-15: 3 [IU] via SUBCUTANEOUS
  Administered 2021-05-15: 1 [IU] via SUBCUTANEOUS
  Administered 2021-05-15 – 2021-05-16 (×2): 2 [IU] via SUBCUTANEOUS
  Administered 2021-05-16: 1 [IU] via SUBCUTANEOUS
  Administered 2021-05-16: 2 [IU] via SUBCUTANEOUS
  Administered 2021-05-16: 1 [IU] via SUBCUTANEOUS
  Administered 2021-05-16 – 2021-05-17 (×2): 2 [IU] via SUBCUTANEOUS
  Administered 2021-05-17: 1 [IU] via SUBCUTANEOUS
  Administered 2021-05-17: 2 [IU] via SUBCUTANEOUS
  Administered 2021-05-17 (×2): 1 [IU] via SUBCUTANEOUS
  Administered 2021-05-17: 2 [IU] via SUBCUTANEOUS
  Administered 2021-05-18: 5 [IU] via SUBCUTANEOUS
  Administered 2021-05-18: 2 [IU] via SUBCUTANEOUS
  Administered 2021-05-18: 3 [IU] via SUBCUTANEOUS
  Administered 2021-05-18: 2 [IU] via SUBCUTANEOUS
  Administered 2021-05-18 (×2): 1 [IU] via SUBCUTANEOUS
  Administered 2021-05-19: 3 [IU] via SUBCUTANEOUS
  Administered 2021-05-19 (×4): 2 [IU] via SUBCUTANEOUS
  Administered 2021-05-19: 3 [IU] via SUBCUTANEOUS
  Administered 2021-05-20: 1 [IU] via SUBCUTANEOUS
  Administered 2021-05-20: 2 [IU] via SUBCUTANEOUS
  Administered 2021-05-20: 7 [IU] via SUBCUTANEOUS
  Administered 2021-05-20: 1 [IU] via SUBCUTANEOUS
  Administered 2021-05-20 (×2): 2 [IU] via SUBCUTANEOUS
  Administered 2021-05-21 (×2): 1 [IU] via SUBCUTANEOUS
  Administered 2021-05-21 (×2): 2 [IU] via SUBCUTANEOUS
  Administered 2021-05-21: 1 [IU] via SUBCUTANEOUS
  Administered 2021-05-21 – 2021-05-22 (×2): 2 [IU] via SUBCUTANEOUS
  Administered 2021-05-22 (×3): 1 [IU] via SUBCUTANEOUS
  Administered 2021-05-22: 2 [IU] via SUBCUTANEOUS
  Administered 2021-05-22: 1 [IU] via SUBCUTANEOUS
  Administered 2021-05-23: 2 [IU] via SUBCUTANEOUS
  Administered 2021-05-23 (×3): 1 [IU] via SUBCUTANEOUS

## 2021-05-13 NOTE — Progress Notes (Signed)
Patient ID: Caitlin Oliver, female   DOB: 06/18/1940, 81 y.o.   MRN: [id number] I just came to the bedside again in the unit to check on the patient.  She is still quite tachycardic and hypotensive.  Unfortunately she was not much arousable given that nursing had given her Dilaudid because she was moaning quite a bit.  I did assess her right lower extremity and her compartments are soft and her right foot is well-perfused.  Of note this was a closed fracture.  Her fracture was a low-energy fracture from a mechanical fall.  The splint does have her leg and good alignment.  This is a fracture that would still be amenable to intramedullary nailing/rod to better stabilize the fracture and have a better chance of healing without the need for any type of splinting.  This could lead to early weightbearing as well.  Given that she is still tachycardic and hypotensive, I do not plan on proceeding with surgery tomorrow morning and will wait until she is more medically stable at the first of the week.  I have also spoken to one of the orthopedic colleagues who is a trauma specialist who also agrees with the fact that there is no urgent rush to proceed with a surgical intervention.  I will check on her again tomorrow but do plan on NOT proceeding to surgery tomorrow.

## 2021-05-13 NOTE — Progress Notes (Signed)
FPTS Interim Progress Note  S: Paged to short stay by anesthesiologist.  Concern for sepsis she is febrile and hypotensive.  Patient altered.  They do not plan to take her to the OR.  Expressed that we will come to bedside immediately.  O: BP (!) 80/44    Pulse 91    Temp (!) 102.7 F (39.3 C) (Oral)    Resp (!) 22    Ht _0  (1.626 m)    Wt 79.3 kg    SpO2 95%    BMI 30.01 kg/m   General: Appears tired.  HEENT: No cervical rigidity.  Cardiac: Tachycardic, regular rhythm, normal heart sounds, no murmurs Respiratory: CTAB, mild increased work of breathing minimal snoring Abdomen: soft, nontender, nondistended Extremities: RLE with soft cast, good cap refill, appears non tender at this time.  Skin: Warm and dry, no rashes or wounds noted Neuro: Lethargic.  Answers to name and somewhat follows commands.  Unable to answer orienting questions.  A/P: Concern for sepsis Febrile. Hypotensive. Lethargic. Did not meet sepsis criteria on admission yesterday. Source unknown at this time.  - Consulted CCM: We will transfer care to ICU - Started Levophed - Stat chest x-ray - Blood cultures x2 -Obtain UA and urine culture - Start antibiotics, Vanco and ceftriaxone/Vanco and cefepime  Autry-Lott, Naaman Plummer, DO 05/13/2021, 8:55 AM PGY-3, [place] Medicine Service pager [phone]

## 2021-05-13 NOTE — Progress Notes (Signed)
Surgery cancelled., due to hypotension or possible septic. Primary treatment team in to evaluate pt. Rapid response nurse in and took over care, awaiting on bed assignment.

## 2021-05-13 NOTE — Progress Notes (Signed)
Called ED regarding the USGPIV. Was told the patient is currently in preop.

## 2021-05-13 NOTE — Progress Notes (Signed)
Patient ID: CHERONDA ERCK, female   DOB: 06/21/1940, 81 y.o.   MRN: [id number] The patient presents this morning for surgical intervention as it relates to her right tibia shaft fracture.  The plan is to place an intramedullary nail in the right tibia to stabilize the fracture.  I did see the patient in the emergency room late yesterday afternoon and explained the surgery to her in detail as well as discussed the risk and benefits of surgery.  We talked about operative and nonoperative treatment measures.  She is completely aware of things cognitively.  I spoke to her niece as well but also understands the rationale behind proceeding with surgery.  I did put in orders yesterday for informed consent.  That was never done.  The patient is somnolent this morning from getting pain medications and cannot give consent herself.  I did speak to her niece again who gives permission for [place] to proceed with surgery which I think is the best and safest thing to do.  The patient does follow commands when I wake her up but she is obviously sleepy from being up her last 24 hours and also getting pain medicines.  I do believe it is necessary to proceed with surgery.  I did get nursing to witness the consent as well.  She is running a temperature but I believe this is from atelectasis and her pain medications because she has been supine for the last 24 hours as well and has not had any type of incentive spirometer.

## 2021-05-13 NOTE — Consult Note (Addendum)
NAME:  Caitlin Oliver, MRN:  [id number], DOB:  25-Dec-1940, LOS: 1 ADMISSION DATE:  05/12/2021, CONSULTATION DATE: 1/28 REFERRING MD: Dr. Mila Homer, CHIEF COMPLAINT: Hypotension  History of Present Illness:  81 year old female with multiple medical problems significant for type 2 diabetes, carotid artery stenosis status post bilateral endarterectomies, extensive PAD, pulmonary hypertension, and COPD.  She wears 3 L of oxygen at chronically.  She lives at home alone and uses a walker.  She gets winded easily after ambulating for short period of time.  She presented most emergency department on 1/27 with complaints of fall at home.  She fell while in the bathroom states she got dizzy but also her leg gave out.  She was unable to get off the floor due to extreme pain in the right lower extremity.  She was able to make to the phone and called EMS.  Of note she did fall 1 week prior after experiencing dizziness for which she presented to the emergency department suffering a head laceration which was stapled.  Also of note she is on aspirin and Plavix.  Initially in the emergency department she was hypertensive and tachypneic.  She also had deformity of the right lower extremity with bruising over the lateral aspect.  Imaging of the right lower extremity demonstrated severe tib-fib fracture and high risk for compartment syndrome.  She is admitted to the hospital for surgical management.  Orthopedics was consulted and she was scheduled for surgery on 1/28, however during the preop assessment she was noted to be hypotensive and lethargic.  Hypotension did not improve with IV fluids and norepinephrine was started.  Surgery was aborted and PCCM was consulted for ICU transfer.  Pertinent  Medical History   has a past medical history of Anxiety, Arthritis, Cancer ([place]), Carotid artery disease ([place]), Cataracts, bilateral, CHF (congestive heart failure) ([place]), Claudication ([place]), COPD (chronic obstructive pulmonary  disease) ([place]), Diabetes mellitus, Dizzy, Family history of adverse reaction to anesthesia, GERD (gastroesophageal reflux disease), Headache, History of kidney stones, Hypercholesteremia, Hypertension, Peripheral arterial disease ([place]), Pneumonia, Stomach ulcer, Stroke ([place]), Tobacco abuse, and Wears glasses.   Significant Hospital Events: Including procedures, antibiotic start and stop dates in addition to other pertinent events   1/27 admit for R tib/fib closed fx Surgery cancelled re shock  Interim History / Subjective:     Objective   Blood pressure (!) 80/44, pulse 91, temperature (!) 102.7 F (39.3 C), temperature source Oral, resp. rate (!) 22, height _0  (1.626 m), weight 79.3 kg, SpO2 95 %.       No intake or output data in the 24 hours ending 05/13/21 0851 Filed Weights   05/12/21 2053  Weight: 79.3 kg    Examination: General: elderly appearing female in NAD HENT: [place]/AT, PERRL, no JVD Lungs: Diminished throughout L>R. No distress. On 3L O2 Cardiovascular: Borderline tachy, no MRG Abdomen: Soft, non-tender, non-distended Extremities: ACE wrap, splint to RLE. Feels tight. Cap refill brisk. Sensation intact.  Neuro: Somnolent, will briefly arouse to answer questions.   Resolved Hospital Problem list     Assessment & Plan:   Shock: etiology unclear. She has had two episodes of syncope recently which would raise concern for cardiogenic vs progressive infection. Febrile overnight to 102. WBC 12.4.  - Transfer to ICU - s/p 2.5L IVF resuscitation.  - NE peripherally for MAP 65.  - has not had opioids since midnight.  - Echocardiogram - CXR, UA.  - Trend WBC and fever curve.  - Ceftriaxone/Vanocmycin.  Low threshold to DC vanco with negative swab.   Closed fracture of R tib/fib. Present on admission - Per ortho - Surgery on hold re: shock.  - holding plavix - Monitor s/s compartment syndrome.  Chronic hypoxic respiratory failure PAH Chronic HFpEF.  - continue  supplemental O2 3L for sat goal 92-98% - holding diuretics (shock) - hold further iVF resuscitation - Repeating echo  CKD IV - Creatinine at baseline.  - Follow BMP  DM2 - CBG monitoring and SSI  Best Practice (right click and "Reselect all SmartList Selections" daily)   Diet/type: NPO DVT prophylaxis: prophylactic heparin  GI prophylaxis: PPI Lines: N/A Foley:  N/A Code Status:  DNR Last date of multidisciplinary goals of care discussion _0   Labs   CBC: Recent Labs  Lab 05/12/21 1303 05/13/21 0219  WBC 10.2 12.4*  NEUTROABS 8.7*  --   HGB 12.8 11.3*  HCT 39.7 34.4*  MCV 93.0 92.7  PLT 232 016    Basic Metabolic Panel: Recent Labs  Lab 05/12/21 1303 05/13/21 0219  NA 137 138  K 4.9 5.1  CL 96* 96*  CO2 34* 33*  GLUCOSE 213* 188*  BUN 19 20  CREATININE 1.74* 1.78*  CALCIUM 9.1 8.9   GFR: Estimated Creatinine Clearance: 25.7 mL/min (A) (by C-G formula based on SCr of 1.78 mg/dL (H)). Recent Labs  Lab 05/12/21 1303 05/13/21 0219  WBC 10.2 12.4*    Liver Function Tests: Recent Labs  Lab 05/12/21 1303  AST 20  ALT 16  ALKPHOS 58  BILITOT 0.4  PROT 6.5  ALBUMIN 3.8   No results for input(s): LIPASE, AMYLASE in the last 168 hours. No results for input(s): AMMONIA in the last 168 hours.  ABG    Component Value Date/Time   PHART 7.374 11/20/2018 1527   PCO2ART 69.0 (HH) 11/20/2018 1527   PO2ART 59.0 (L) 11/20/2018 1527   HCO3 38.4 (H) 12/29/2020 1957   TCO2 40 (H) 12/29/2020 1957   ACIDBASEDEF 0.5 02/01/2016 1400   O2SAT 99.0 12/29/2020 1957     Coagulation Profile: Recent Labs  Lab 05/12/21 1303  INR 1.0    Cardiac Enzymes: No results for input(s): CKTOTAL, CKMB, CKMBINDEX, TROPONINI in the last 168 hours.  HbA1C: Hgb A1c MFr Bld  Date/Time Value Ref Range Status  06/19/2019 06:47 AM 7.8 (H) 4.8 - 5.6 % Final    Comment:    (NOTE) Pre diabetes:          5.7%-6.4% Diabetes:              >6.4% Glycemic control for    <7.0% adults with diabetes   06/17/2018 04:30 AM 7.9 (H) 4.8 - 5.6 % Final    Comment:    (NOTE) Pre diabetes:          5.7%-6.4% Diabetes:              >6.4% Glycemic control for   <7.0% adults with diabetes     CBG: No results for input(s): GLUCAP in the last 168 hours.  Review of Systems:   Bolds are positive  Constitutional: weight loss, gain, night sweats, Fevers, chills, fatigue .  HEENT: headaches, Sore throat, sneezing, nasal congestion, post nasal drip, Difficulty swallowing, Tooth/dental problems, visual complaints visual changes, ear ache CV:  chest pain, radiates:,Orthopnea, PND, swelling in lower extremities, dizziness, palpitations, syncope.  GI  heartburn, indigestion, abdominal pain, nausea, vomiting, diarrhea, change in bowel habits, loss of appetite, bloody stools.  Resp: cough, productive: ,  hemoptysis, dyspnea, chest pain, pleuritic.  Skin: rash or itching or icterus GU: dysuria, change in color of urine, urgency or frequency. flank pain, hematuria  MS: RLE pain, paresthesia or swelling. decreased range of motion  Psych: change in mood or affect. depression or anxiety.  Neuro: difficulty with speech, weakness, numbness, ataxia    Past Medical History:  She,  has a past medical history of Anxiety, Arthritis, Cancer ([place]), Carotid artery disease ([place]), Cataracts, bilateral, CHF (congestive heart failure) ([place]), Claudication ([place]), COPD (chronic obstructive pulmonary disease) ([place]), Diabetes mellitus, Dizzy, Family history of adverse reaction to anesthesia, GERD (gastroesophageal reflux disease), Headache, History of kidney stones, Hypercholesteremia, Hypertension, Peripheral arterial disease ([place]), Pneumonia, Stomach ulcer, Stroke ([place]), Tobacco abuse, and Wears glasses.   Surgical History:   Past Surgical History:  Procedure Laterality Date   ABDOMINAL AORTIC ENDOVASCULAR STENT GRAFT Right 06/19/2019   Procedure: INNOMINATE STENT, RIGHT CAROTID STENT, RIGHT  SUBCLAVIAN STENT AND CAROTID CUTDOWN;  Surgeon: Marty Heck, MD;  Location: [place];  Service: Vascular;  Laterality: Right;   ABDOMINAL HYSTERECTOMY     APPENDECTOMY     BREAST REDUCTION SURGERY     CAROTID ANGIOGRAM N/A 02/25/2013   Procedure: CAROTID ANGIOGRAM;  Surgeon: Lorretta Harp, MD;  Location: [facility] CATH LAB;  Service: Cardiovascular;  Laterality: N/A;   ENDARTERECTOMY Right 03/06/2013   Procedure: ENDARTERECTOMY CAROTID-RIGHT;  Surgeon: Serafina Mitchell, MD;  Location: [place];  Service: Vascular;  Laterality: Right;   ENDARTERECTOMY Left 05/07/2013   Procedure: LEFT CAROTID ARTERY ENDARTERECTOMY WITH VASCU-GUARD PATCH ANGIOPLASTY ;  Surgeon: Serafina Mitchell, MD;  Location: [place];  Service: Vascular;  Laterality: Left;   INTRAOPERATIVE ARTERIOGRAM N/A 06/19/2019   Procedure: Clydie Braun;  Surgeon: Marty Heck, MD;  Location: [place];  Service: Vascular;  Laterality: N/A;   IR ANGIO INTRA EXTRACRAN SEL COM CAROTID INNOMINATE UNI R MOD SED  05/14/2019   IR ANGIO VERTEBRAL SEL SUBCLAVIAN INNOMINATE BILAT MOD SED  05/14/2019   LOBECTOMY Left 02/03/2016   Procedure: LEFT UPPER LOBECTOMY;  Surgeon: [name], MD;  Location: [place];  Service: Thoracic;  Laterality: Left;   LOWER EXTREMITY ANGIOGRAM N/A 02/25/2013   Procedure: LOWER EXTREMITY ANGIOGRAM;  Surgeon: Lorretta Harp, MD;  Location: [facility] CATH LAB;  Service: Cardiovascular;  Laterality: N/A;   LOWER EXTREMITY ANGIOGRAM N/A 07/27/2013   Procedure: LOWER EXTREMITY ANGIOGRAM;  Surgeon: Lorretta Harp, MD;  Location: [facility] CATH LAB;  Service: Cardiovascular;  Laterality: N/A;   PATCH ANGIOPLASTY Right 03/06/2013   Procedure: PATCH ANGIOPLASTY of Right Carotid Artery using Vascu-Guard Patch;  Surgeon: Serafina Mitchell, MD;  Location: [place];  Service: Vascular;  Laterality: Right;   PERIPHERAL VASCULAR CATHETERIZATION N/A 11/15/2015   Procedure: Aortic Arch Angiography;  Surgeon: Serafina Mitchell, MD;  Location: [place]  CV LAB;  Service: Cardiovascular;  Laterality: N/A;   PERIPHERAL VASCULAR CATHETERIZATION Bilateral 11/15/2015   Procedure: Carotid Angiography;  Surgeon: Serafina Mitchell, MD;  Location: [place] CV LAB;  Service: Cardiovascular;  Laterality: Bilateral;   PERIPHERAL VASCULAR CATHETERIZATION Left 11/15/2015   Procedure: Upper Extremity Angiography;  Surgeon: Serafina Mitchell, MD;  Location: [place] CV LAB;  Service: Cardiovascular;  Laterality: Left;   PERIPHERAL VASCULAR CATHETERIZATION Left 11/15/2015   Procedure: Peripheral Vascular Intervention;  Surgeon: Serafina Mitchell, MD;  Location: [place] CV LAB;  Service: Cardiovascular;  Laterality: Left;  subclavian    RIGHT HEART CATH N/A 06/16/2019   Procedure: RIGHT  HEART CATH;  Surgeon: Jolaine Artist, MD;  Location: [place] CV LAB;  Service: Cardiovascular;  Laterality: N/A;   RIGHT/LEFT HEART CATH AND CORONARY ANGIOGRAPHY N/A 11/20/2018   Procedure: RIGHT/LEFT HEART CATH AND CORONARY ANGIOGRAPHY;  Surgeon: Nelva Bush, MD;  Location: [place] CV LAB;  Service: Cardiovascular;  Laterality: N/A;   SALIVARY GLAND SURGERY     scar tissue removed from left saliva glad   TUBAL LIGATION     ULTRASOUND GUIDANCE FOR VASCULAR ACCESS Right 06/19/2019   Procedure: Ultrasound Guidance For Vascular Access;  Surgeon: Marty Heck, MD;  Location: [place];  Service: Vascular;  Laterality: Right;   VIDEO ASSISTED THORACOSCOPY (VATS)/WEDGE RESECTION Left 02/03/2016   Procedure: VIDEO ASSISTED THORACOSCOPY;  Surgeon: [name], MD;  Location: [place];  Service: Thoracic;  Laterality: Left;     Social History:   reports that she quit smoking about 7 years ago. Her smoking use included cigarettes. She has a 84.00 pack-year smoking history. She quit smokeless tobacco use about 8 years ago. She reports that she does not drink alcohol and does not use drugs.   Family History:  Her family history includes Hypertension in her mother;  Kidney failure in her father; Stroke in her mother. There is no history of Lung disease.   Allergies Allergies  Allergen Reactions   Fentanyl Shortness Of Breath     Home Medications  Prior to Admission medications   Medication Sig Start Date End Date Taking? Authorizing Provider  acetaminophen (TYLENOL) 500 MG tablet Take 500 mg by mouth every 6 (six) hours as needed for moderate pain.   Yes [provider]  albuterol (PROVENTIL HFA;VENTOLIN HFA) 108 (90 Base) MCG/ACT inhaler Inhale 2 puffs into the lungs every 6 (six) hours as needed for wheezing or shortness of breath.  07/11/17  Yes [provider]  albuterol (PROVENTIL) (2.5 MG/3ML) 0.083% nebulizer solution Take 2.5 mg by nebulization in the morning and at bedtime.   Yes [provider]  Ascorbic Acid (VITAMIN C) 1000 MG tablet Take 1,000 mg by mouth daily.   Yes [provider]  Biotin 1000 MCG tablet Take 1,000 mcg by mouth daily.   Yes [provider]  clidinium-chlordiazePOXIDE (LIBRAX) 5-2.5 MG capsule Take 1 capsule by mouth 2 (two) times daily.   Yes [provider]  clopidogrel (PLAVIX) 75 MG tablet Take 1 tablet (75 mg total) by mouth daily. 06/20/18  Yes Thurnell Lose, MD  diazepam (VALIUM) 5 MG tablet Take 5 mg by mouth every 8 (eight) hours as needed for anxiety. 09/08/19  Yes [provider]  dicyclomine (BENTYL) 20 MG tablet Take 20 mg by mouth daily as needed for spasms.   Yes [provider]  docusate sodium (COLACE) 100 MG capsule Take 100 mg by mouth 2 (two) times daily as needed for mild constipation.   Yes [provider]  ferrous sulfate 325 (65 FE) MG tablet Take 325 mg by mouth daily with breakfast.   Yes [provider]  FLUoxetine (PROZAC) 20 MG capsule Take 20 mg by mouth daily.   Yes [provider]  gabapentin (NEURONTIN) 300 MG capsule Take 300 mg by mouth at bedtime.   Yes [provider]   HYDROcodone-acetaminophen (NORCO/VICODIN) 5-325 MG tablet Take 1 tablet by mouth every 6 (six) hours as needed for moderate pain. 06/21/19  Yes Dagoberto Ligas, PA-C  Magnesium 250 MG TABS Take 250 mg by mouth daily.   Yes [provider]  metFORMIN (GLUCOPHAGE) 500 MG tablet Take by mouth 2 (two) times daily with a meal.   Yes [provider]  metolazone (ZAROXOLYN) 5 MG tablet Take 5 mg by mouth daily as needed (Fluid).   Yes [provider]  metoprolol tartrate (LOPRESSOR) 50 MG tablet Take 50 mg by mouth 2 (two) times daily.   Yes [provider]  naphazoline-glycerin (CLEAR EYES REDNESS) 0.012-0.25 % SOLN 1-2 drops 4 (four) times daily as needed for eye irritation.   Yes [provider]  ondansetron (ZOFRAN) 4 MG tablet Take 4 mg by mouth at bedtime as needed for nausea or vomiting.  06/05/18  Yes [provider]  OXYGEN Inhale into the lungs. 3 liters n/c   Yes [provider]  pantoprazole (PROTONIX) 40 MG tablet Take 40 mg by mouth 2 (two) times daily.  12/17/13  Yes [provider]  polyethylene glycol (MIRALAX / GLYCOLAX) 17 g packet Take 17 g by mouth daily as needed for mild constipation. 12/02/18  Yes [provider]  potassium chloride SA (KLOR-CON M) 20 MEQ tablet Take 20 mEq by mouth daily.   Yes [provider]  pravastatin (PRAVACHOL) 40 MG tablet Take 20 mg by mouth daily before supper.    Yes [provider]  torsemide (DEMADEX) 20 MG tablet Take 2 tablets (40 mg total) by mouth twice a day Patient taking differently: Take 40 mg by mouth 2 (two) times daily. 01/06/21  Yes Bensimhon, Shaune Pascal, MD  traZODone (DESYREL) 50 MG tablet Take 50 mg by mouth at bedtime.   Yes [provider]  ONE TOUCH ULTRA TEST test strip  09/12/16   [provider]  revefenacin (YUPELRI) 175 MCG/3ML nebulizer solution Take 3 mLs (175 mcg total) by nebulization daily. Patient not taking:  Reported on 05/12/2021 04/03/21   Spero Geralds, MD     Critical care time: 42 minutes     Georgann Housekeeper, AGACNP-BC [place] for personal pager PCCM on call pager [phone] until 7pm. Please call Elink 7p-7a. [phone]  05/13/2021 9:18 AM

## 2021-05-13 NOTE — Progress Notes (Signed)
Pharmacy Antibiotic Note  Caitlin Oliver is a 81 y.o. female admitted on 05/12/2021 with sepsis.  Pharmacy has been consulted for Vanco dosing.  CC/HPI: Fall with right tibia shaft fracture 1/27.   - 1/28: The plan is to place an intramedullary nail in the right tibia to stabilize the fracture.>>surgery cancelled for fever and hypotension  PMH: anxiety, arthritis, lung cancer, carotid dz, BL cataracts, CHF, claudication, COPD, DM, GERD, Ha's, kidney stones, HLD, HTN, PAD, stomach ulcer, CVA, tobacco,   ID: Sepsis with temperature and hypotension. Source unknown - Tmax 102.7. wBC 12.4  1/27: COVID/Flu: negative 1/28: MRSA PCR: negative   Plan: Vancomycin 1g IV x 1 Vancomycin 1000 mg IV Q 48 hrs. Goal AUC 400-550. Expected AUC: 561 higher end SCr used: 1.78 Adjust Vancomycin dosing if Scr improves Narrow to Rocephin/Azithro if rule-in PNA    Height: _0  (162.6 cm) Weight: 79.3 kg (174 lb 13.2 oz) IBW/kg (Calculated) : 54.7  Temp (24hrs), Avg:99.2 F (37.3 C), Min:97.4 F (36.3 C), Max:102.7 F (39.3 C)  Recent Labs  Lab 05/12/21 1303 05/13/21 0219  WBC 10.2 12.4*  CREATININE 1.74* 1.78*    Estimated Creatinine Clearance: 25.7 mL/min (A) (by C-G formula based on SCr of 1.78 mg/dL (H)).    Allergies  Allergen Reactions   Fentanyl Shortness Of Breath    Caitlin Oliver S. Alford Highland, PharmD, BCPS Clinical Staff Pharmacist [email or web address]  Wayland Salinas 05/13/2021 8:53 AM

## 2021-05-13 NOTE — Progress Notes (Signed)
Patient ID: Caitlin Oliver, female   DOB: 01-Sep-1940, 81 y.o.   MRN: [id number] I did speak with anesthesia and nursing at the patient's bedside in short stay.  She has spiked a temperature of 102.  She is also hypertensive and only received Tylenol for pain a few hours ago.  Urinalysis apparently was never sent.  The concern is whether or not she is slightly septic and needs to be further evaluated by the primary service and optimized medically prior to proceeding with surgery today.  We will cancel surgery today and plan on the possibility of surgery tomorrow if she is medically clear.  She can eat from our standpoint today and will need to be n.p.o. after midnight again.

## 2021-05-13 NOTE — Progress Notes (Signed)
eLink Physician-Brief Progress Note Patient Name: Caitlin Oliver DOB: [birth year] MRN: [id number]   Date of Service  05/13/2021  HPI/Events of Note  Patient has scheduled tylenol ordered PO but patient too lethargic. Request received to shift to per suppository. Also had short run of VT  eICU Interventions  Switched Tylenol PO to supp AM electrolyte and CBC ordered     Intervention Category Intermediate Interventions: Other:  Judd Lien 05/13/2021, 11:46 PM

## 2021-05-13 NOTE — Progress Notes (Signed)
New admission in 5N16.. Patient received from the ED. Patient A&O x 4. VS and assessment completed. Patient oriented to the unit. Patient  complained of pain to the right lower extremities, pain medication given (please see MAR). No further questions from patient at this time. Call light and side table within reach. 3 side rails up and bed placed in the lowest position.

## 2021-05-13 NOTE — Progress Notes (Addendum)
FPTS Brief Progress Note  S: Came to patient's bedside for night rounds.  Patient was asleep upon arrival. I did not wake her up.  But examined her lower extremities.   O: BP (!) 117/44 (BP Location: Left Arm)    Pulse 79    Temp 98.8 F (37.1 C) (Oral)    Resp 17    Ht _0  (1.626 m)    Wt 79.3 kg    SpO2 96%    BMI 30.01 kg/m   General: Asleep, NAD Pulm: Good work of breathing on room air Ext: No BLE edema, right leg dressed in bandage.   A/P: -Continue plan per day team - Orders reviewed. Labs for AM ordered, which was adjusted as needed.    Alen Bleacher, MD 05/13/2021, 2:53 AM PGY-1, [facility] Night Resident  Please page [phone] with questions.

## 2021-05-13 NOTE — Significant Event (Signed)
Rapid Response Event Note   Reason for Call :  Hypotension  Initial Focused Assessment:  Patient scheduled for procedure this am but the procedure was cancelled due to the patients BP. I was called to help manage this patient while she is in short stay until she can get an ICU bed. The patient is currently on 3 mcg of Levo. Unable to scan levo at this time.   BP  114/56 (titrated from 5--> 3 mcg) HR 104 O2 93 (3L [place])  Patient answering questions but falls asleep in between questions.   2620 update BP continues to be soft. NE increased to 9 mcg. Patient still quite somnolent. Patient to be transferred to 3M-05   Event Summary:   MD Notified: CCM bedside as well as CRNA Call Time: 0845 Arrival Time: 0845 End Time: [place]  Venetia Maxon, RN

## 2021-05-14 ENCOUNTER — Inpatient Hospital Stay (HOSPITAL_COMMUNITY): Payer: PPO

## 2021-05-14 ENCOUNTER — Encounter (HOSPITAL_COMMUNITY)
Admission: EM | Disposition: A | Discharge status: Skilled Nursing Facility | Payer: Self-pay | Source: Home / Self Care | Attending: Family Medicine

## 2021-05-14 DIAGNOSIS — J9601 Acute respiratory failure with hypoxia: Secondary | ICD-10-CM | POA: Diagnosis not present

## 2021-05-14 DIAGNOSIS — R0603 Acute respiratory distress: Secondary | ICD-10-CM

## 2021-05-14 DIAGNOSIS — G9340 Encephalopathy, unspecified: Secondary | ICD-10-CM | POA: Diagnosis not present

## 2021-05-14 DIAGNOSIS — R6521 Severe sepsis with septic shock: Secondary | ICD-10-CM | POA: Diagnosis not present

## 2021-05-14 DIAGNOSIS — A419 Sepsis, unspecified organism: Secondary | ICD-10-CM | POA: Diagnosis not present

## 2021-05-14 DIAGNOSIS — J9602 Acute respiratory failure with hypercapnia: Secondary | ICD-10-CM | POA: Diagnosis not present

## 2021-05-14 LAB — URINE CULTURE: Culture: <10000 — AB

## 2021-05-14 LAB — POCT I-STAT 7, (LYTES, BLD GAS, ICA,H+H)
Acid-Base Excess: 5 mmol/L — ABNORMAL HIGH (ref 0.0–2.0)
Bicarbonate: 33.7 mmol/L — ABNORMAL HIGH (ref 20.0–28.0)
Calcium, Ion: 1.25 mmol/L (ref 1.15–1.40)
HCT: 37 % (ref 36.0–46.0)
Hemoglobin: 12.6 g/dL (ref 12.0–15.0)
O2 Saturation: 97 %
Patient temperature: 99.1
Potassium: 4.5 mmol/L (ref 3.5–5.1)
Sodium: 142 mmol/L (ref 135–145)
TCO2: 36 mmol/L — ABNORMAL HIGH (ref 22–32)
pCO2 arterial: 68.9 mmHg (ref 32.0–48.0)
pH, Arterial: 7.299 — ABNORMAL LOW (ref 7.350–7.450)
pO2, Arterial: 107 mmHg (ref 83.0–108.0)

## 2021-05-14 LAB — CBC
HCT: 34.8 % — ABNORMAL LOW (ref 36.0–46.0)
Hemoglobin: 11.1 g/dL — ABNORMAL LOW (ref 12.0–15.0)
MCH: 30.5 pg (ref 26.0–34.0)
MCHC: 31.9 g/dL (ref 30.0–36.0)
MCV: 95.6 fL (ref 80.0–100.0)
Platelets: 172 10*3/uL (ref 150–400)
RBC: 3.64 MIL/uL — ABNORMAL LOW (ref 3.87–5.11)
RDW: 12.2 % (ref 11.5–15.5)
WBC: 13 10*3/uL — ABNORMAL HIGH (ref 4.0–10.5)
nRBC: 0 % (ref 0.0–0.2)

## 2021-05-14 LAB — BASIC METABOLIC PANEL
Anion gap: 9 (ref 5–15)
BUN: 18 mg/dL (ref 8–23)
CO2: 30 mmol/L (ref 22–32)
Calcium: 8.6 mg/dL — ABNORMAL LOW (ref 8.9–10.3)
Chloride: 102 mmol/L (ref 98–111)
Creatinine, Ser: 1.5 mg/dL — ABNORMAL HIGH (ref 0.44–1.00)
GFR, Estimated: 35 mL/min — ABNORMAL LOW (ref >60)
Glucose, Bld: 154 mg/dL — ABNORMAL HIGH (ref 70–99)
Potassium: 4.6 mmol/L (ref 3.5–5.1)
Sodium: 141 mmol/L (ref 135–145)

## 2021-05-14 LAB — MAGNESIUM: Magnesium: 2 mg/dL (ref 1.7–2.4)

## 2021-05-14 LAB — TROPONIN I (HIGH SENSITIVITY): Troponin I (High Sensitivity): 370 ng/L (ref <18)

## 2021-05-14 LAB — GLUCOSE, CAPILLARY
Glucose-Capillary: 136 mg/dL — ABNORMAL HIGH (ref 70–99)
Glucose-Capillary: 132 mg/dL — ABNORMAL HIGH (ref 70–99)
Glucose-Capillary: 159 mg/dL — ABNORMAL HIGH (ref 70–99)
Glucose-Capillary: 158 mg/dL — ABNORMAL HIGH (ref 70–99)
Glucose-Capillary: 151 mg/dL — ABNORMAL HIGH (ref 70–99)
Glucose-Capillary: 133 mg/dL — ABNORMAL HIGH (ref 70–99)

## 2021-05-14 LAB — ECHOCARDIOGRAM COMPLETE
Area-P 1/2: 4.36 cm2
Height: 64 in
S' Lateral: 2.9 cm
Weight: 2797.2 oz

## 2021-05-14 LAB — PROCALCITONIN: Procalcitonin: 0.31 ng/mL

## 2021-05-14 SURGERY — INSERTION, INTRAMEDULLARY ROD, TIBIA
Anesthesia: General | Site: Leg Lower | Laterality: Right

## 2021-05-14 MED ORDER — DEXMEDETOMIDINE HCL IN NACL 400 MCG/100ML IV SOLN
0.4000 ug/kg/h | INTRAVENOUS | Status: DC
  Administered 2021-05-14 – 2021-05-15 (×3): 0.4 ug/kg/h via INTRAVENOUS
  Filled 2021-05-14 (×4): qty 100

## 2021-05-14 MED ORDER — LORAZEPAM 2 MG/ML IJ SOLN
1.0000 mg | Freq: Once | INTRAMUSCULAR | Status: AC
  Administered 2021-05-14: 1 mg via INTRAVENOUS
  Filled 2021-05-14: qty 1

## 2021-05-14 MED ORDER — PERFLUTREN LIPID MICROSPHERE
1.0000 mL | INTRAVENOUS | Status: AC | PRN
  Administered 2021-05-14: 3 mL via INTRAVENOUS
  Filled 2021-05-14: qty 10

## 2021-05-14 MED ORDER — ORAL CARE MOUTH RINSE
15.0000 mL | Freq: Two times a day (BID) | OROMUCOSAL | Status: DC
  Administered 2021-05-14 – 2021-05-25 (×23): 15 mL via OROMUCOSAL

## 2021-05-14 MED ORDER — PANTOPRAZOLE SODIUM 40 MG IV SOLR
40.0000 mg | Freq: Two times a day (BID) | INTRAVENOUS | Status: DC
  Administered 2021-05-14 – 2021-05-15 (×4): 40 mg via INTRAVENOUS
  Filled 2021-05-14 (×5): qty 40

## 2021-05-14 MED ORDER — LORAZEPAM 2 MG/ML IJ SOLN
0.5000 mg | Freq: Four times a day (QID) | INTRAMUSCULAR | Status: DC | PRN
  Administered 2021-05-14 – 2021-05-15 (×4): 0.5 mg via INTRAVENOUS
  Filled 2021-05-14 (×4): qty 1

## 2021-05-14 MED ORDER — ONDANSETRON HCL 4 MG/2ML IJ SOLN
4.0000 mg | Freq: Four times a day (QID) | INTRAMUSCULAR | Status: DC | PRN
  Administered 2021-05-18 – 2021-05-27 (×11): 4 mg via INTRAVENOUS
  Filled 2021-05-14 (×12): qty 2

## 2021-05-14 MED ORDER — CHLORHEXIDINE GLUCONATE 0.12 % MT SOLN
15.0000 mL | Freq: Two times a day (BID) | OROMUCOSAL | Status: DC
  Administered 2021-05-14 – 2021-05-26 (×22): 15 mL via OROMUCOSAL
  Filled 2021-05-14 (×19): qty 15

## 2021-05-14 NOTE — Progress Notes (Addendum)
Discussed with niece, Prince Solian and obtain more history.  She has been on Librax for more than a year.  She has been on Valium for a long time but only takes this as needed DNR confirmed  ABG showed acute respiratory acidosis/acute hypercarbic respiratory failure and placed on BiPAP. Discussed with neurology, prefer MRI over repeat CT head MRI could not be done due to instability and x-ray abdomen obtained to confirm no metal Started Precedex, blood pressure dropped initially but then improved -We will also use low-dose Ativan every 6h as needed  Slightly elevated troponins noted, echo confirms normal LVEF and elevated RVSP 80 , will resume diuresis if remains off pressors  Additional critical care time x 30 m  Debby Clyne V. Elsworth Soho MD

## 2021-05-14 NOTE — Progress Notes (Signed)
Prince Solian (pts niece) called and stated she does not want Katie ( pts. prior home health aid ) to visit or receive updates.  Password has been created.

## 2021-05-14 NOTE — Progress Notes (Signed)
NAME:  Caitlin Oliver, MRN:  [id number], DOB:  Nov 02, 1940, LOS: 2 ADMISSION DATE:  05/12/2021, CONSULTATION DATE: 1/28 REFERRING MD: Dr. Mila Homer, CHIEF COMPLAINT: Hypotension  History of Present Illness:  81 year old female with multiple medical problems significant for type 2 diabetes, carotid artery stenosis status post bilateral endarterectomies, extensive PAD, pulmonary hypertension, and COPD.  She wears 3 L of oxygen at chronically.  She lives at home alone and uses a walker.  She gets winded easily after ambulating for short period of time.  She presented most emergency department on 1/27 with complaints of fall at home.  She fell while in the bathroom states she got dizzy but also her leg gave out.  She was unable to get off the floor due to extreme pain in the right lower extremity.  She was able to make to the phone and called EMS.  Of note she did fall 1 week prior after experiencing dizziness for which she presented to the emergency department suffering a head laceration which was stapled.  Also of note she is on aspirin and Plavix.  Initially in the emergency department she was hypertensive and tachypneic.  She also had deformity of the right lower extremity with bruising over the lateral aspect.  Imaging of the right lower extremity demonstrated severe tib-fib fracture and high risk for compartment syndrome.  She is admitted to the hospital for surgical management.  Orthopedics was consulted and she was scheduled for surgery on 1/28, however during the preop assessment she was noted to be hypotensive and lethargic.  Hypotension did not improve with IV fluids and norepinephrine was started.  Surgery was aborted and PCCM was consulted for ICU transfer.  Pertinent  Medical History   has a past medical history of Anxiety, Arthritis, Cancer ([place]), Carotid artery disease ([place]), Cataracts, bilateral, CHF (congestive heart failure) ([place]), Claudication ([place]), COPD (chronic obstructive pulmonary  disease) ([place]), Diabetes mellitus, Dizzy, Family history of adverse reaction to anesthesia, GERD (gastroesophageal reflux disease), Headache, History of kidney stones, Hypercholesteremia, Hypertension, Peripheral arterial disease ([place]), Pneumonia, Stomach ulcer, Stroke ([place]), Tobacco abuse, and Wears glasses.   Significant Hospital Events: Including procedures, antibiotic start and stop dates in addition to other pertinent events   1/27 admit for R tib/fib closed fx 1/27 head CT negative CT C-spine -degenerative disease C5/6 1/28 fever, tachycardic, hypotensive, lethargic, surgery cancelled re shock  Interim History / Subjective:   Levophed tapered to off this morning. Received 1 dose of Dilaudid overnight. Afebrile Good urine output This AM , awake and screaming out loud, not directable     Objective   Blood pressure 124/65, pulse (!) 123, temperature 99.1 F (37.3 C), temperature source Axillary, resp. rate 20, height _0  (1.626 m), weight 79.3 kg, SpO2 91 %.        Intake/Output Summary (Last 24 hours) at 05/14/2021 0809 Last data filed at 05/14/2021 0600 Gross per 24 hour  Intake 514.69 ml  Output 1100 ml  Net -585.31 ml   Filed Weights   05/12/21 2053  Weight: 79.3 kg    Examination: General: elderly appearing female in mild distress HENT: [place]/AT, PERRL, no JVD Lungs: Diminished throughout L>R. No distress. On 4L O2 Cardiovascular: S1-S2 tachycardic, no murmur Abdomen: Soft, non-tender, non-distended Extremities: ACE wrap, splint to RLE. Feels tight. Cap refill brisk. Sensation intact.  Neuro: Eyes open, screaming out, nonsensical words, responds to name, intermittently follows one-step commands, rt facial droop, spontaneously moves all extremities  Chest x-ray independently reviewed, left basilar  opacity versus effusion Labs show normal electrolytes, decrease leukocytosis, stable anemia, decreased creatinine to 1.5  Resolved Hospital Problem list      Assessment & Plan:   Shock: etiology unclear. She had two episodes of syncope recently,?  Pulmonary hypertension contributing - febrile 1/28 to 102. WBC 12.4-raises question of septic shock.-No source apparent, left lower lobe pneumonia possible  - Await Echocardiogram - Trend WBC and fever curve.  - Ceftriaxone empiric, discontinue vancomycin since MRSA PCR negative, check procalcitonin for completion   Acute encephalopathy -initial head CT negative Have to rule out acute stroke?  Wernicke's aphasia More likely, could be related to benzo withdrawal-on home Librax and diazepam  -We will use Ativan 1-2 mg x 1 and Precedex -Once mental status improved, can proceed with repeat head CT versus MRI -Neuro consult  Closed fracture of R tib/fib. Present on admission - Per ortho - Surgery on hold re: shock.  - holding plavix - Monitor s/s compartment syndrome. -Would recommend spinal rather than general anesthesia when ready to proceed due to pulmonary hypertension  Chronic hypoxic respiratory failure PAH Chronic HFpEF.  - continue supplemental O2 3L for sat goal 92-98% - holding diuretics (shock)   CKD 3a - Follow BMP  DM2 - CBG monitoring and SSI  Best Practice (right click and "Reselect all SmartList Selections" daily)   Diet/type: NPO DVT prophylaxis: prophylactic heparin  GI prophylaxis: PPI Lines: N/A Foley:  N/A Code Status:  DNR Last date of multidisciplinary goals of care discussion _0   Labs   CBC: Recent Labs  Lab 05/12/21 1303 05/13/21 0219 05/13/21 1228 05/14/21 0100  WBC 10.2 12.4* 17.9* 13.0*  NEUTROABS 8.7*  --   --   --   HGB 12.8 11.3* 11.6* 11.1*  HCT 39.7 34.4* 35.4* 34.8*  MCV 93.0 92.7 94.1 95.6  PLT 232 214 218 172     Basic Metabolic Panel: Recent Labs  Lab 05/12/21 1303 05/13/21 0219 05/14/21 0100  NA 137 138 141  K 4.9 5.1 4.6  CL 96* 96* 102  CO2 34* 33* 30  GLUCOSE 213* 188* 154*  BUN _1 CREATININE 1.74*  1.78* 1.50*  CALCIUM 9.1 8.9 8.6*  MG  --   --  2.0    GFR: Estimated Creatinine Clearance: 30.5 mL/min (A) (by C-G formula based on SCr of 1.5 mg/dL (H)). Recent Labs  Lab 05/12/21 1303 05/13/21 0219 05/13/21 1228 05/14/21 0100  WBC 10.2 12.4* 17.9* 13.0*     Liver Function Tests: Recent Labs  Lab 05/12/21 1303  AST 20  ALT 16  ALKPHOS 58  BILITOT 0.4  PROT 6.5  ALBUMIN 3.8    No results for input(s): LIPASE, AMYLASE in the last 168 hours. No results for input(s): AMMONIA in the last 168 hours.  ABG    Component Value Date/Time   PHART 7.374 11/20/2018 1527   PCO2ART 69.0 (HH) 11/20/2018 1527   PO2ART 59.0 (L) 11/20/2018 1527   HCO3 38.4 (H) 12/29/2020 1957   TCO2 40 (H) 12/29/2020 1957   ACIDBASEDEF 0.5 02/01/2016 1400   O2SAT 99.0 12/29/2020 1957      Coagulation Profile: Recent Labs  Lab 05/12/21 1303  INR 1.0     Cardiac Enzymes: No results for input(s): CKTOTAL, CKMB, CKMBINDEX, TROPONINI in the last 168 hours.  HbA1C: Hgb A1c MFr Bld  Date/Time Value Ref Range Status  05/13/2021 12:28 PM 7.0 (H) 4.8 - 5.6 % Final    Comment:    (NOTE) Pre  diabetes:          5.7%-6.4%  Diabetes:              >6.4%  Glycemic control for   <7.0% adults with diabetes   06/19/2019 06:47 AM 7.8 (H) 4.8 - 5.6 % Final    Comment:    (NOTE) Pre diabetes:          5.7%-6.4% Diabetes:              >6.4% Glycemic control for   <7.0% adults with diabetes     CBG: Recent Labs  Lab 05/13/21 1529 05/13/21 1908 05/13/21 2304 05/14/21 0314 05/14/21 0732  GLUCAP 141* 152* 154* 136* 132*    Critical care time: 35 minutes     Kara Mead MD. FCCP. [facility] Pager : [phone]  If no response to pager , please call [phone] until 7 pm After 7:00 pm call Elink  [phone]     05/14/2021 8:09 AM

## 2021-05-14 NOTE — Progress Notes (Signed)
Patient remains on BIPAP at this time. MRI technician confirmed mask incompatibility with MRI machine. This RN notified Elink and MRI that patient is unable to come off BIPAP at this time. MD made aware. Planning to reevaluate readiness for MRI in the morning.

## 2021-05-14 NOTE — Progress Notes (Signed)
Echocardiogram 2D Echocardiogram has been performed.  Fidel Levy 05/14/2021, 11:29 AM

## 2021-05-14 NOTE — Progress Notes (Signed)
Patient removed from BiPAP and given a 20 minute break to assess patients mental status. Patient unable to answer questions appropriately. Patient moaning. MD notified and placed patient back on BiPAP. MRI contacted to inform that patient is still on BiPAP. Waiting on call back from MRI to confirm mask/machine compatibility. RN aware.

## 2021-05-14 NOTE — Progress Notes (Signed)
Pt transported to CT with no complications.

## 2021-05-14 NOTE — Progress Notes (Signed)
Patient ID: Caitlin Oliver, female   DOB: Sep 07, 1940, 81 y.o.   MRN: [id number] The patient's blood pressure has improved and she is off of any medications that have needed to increase her blood pressure.  She is still tachycardic and quite confused.  This is different from when I met her late Friday afternoon.  I did speak with her niece in length yesterday about delaying surgery.  Also spoke to my orthopedic colleagues who agreed to hold off on surgery a day or 2 to make sure her medical situation is maximized.  Her right lower extremity splint is intact.  Her compartments feel soft.  Her foot is well-perfused.  The plan will be to hopefully proceed to surgery in the next 2 days to stabilize her right tibia fracture with an intramedullary rod/nail.  I will speak to my orthopedic colleagues to see if anyone can potentially get her on the schedule for tomorrow versus Tuesday with even me being able to do it late Tuesday afternoon.  Her diet today will be per the regular team and she should be n.p.o. after midnight tonight just in case surgery is able to be performed tomorrow.

## 2021-05-14 NOTE — Progress Notes (Signed)
Patient ID: Caitlin Oliver, female   DOB: Feb 19, 1941, 81 y.o.   MRN: [id number] I did just speak with Critical Care at the bedside.  The patient denies having significant pulmonary issues chronically and when surgery is scheduled, attempt should be made hopefully from an anesthesia standpoint to perform a block and spinal anesthesia given her chronic lung conditions.  It was also recommended that surgery be delayed today and likely tomorrow as well in order to maximize the patient's medical status.  It is still concerning that she is quite confused compared to when she came into the hospital on Friday.

## 2021-05-15 ENCOUNTER — Inpatient Hospital Stay (HOSPITAL_COMMUNITY): Payer: PPO

## 2021-05-15 DIAGNOSIS — J9621 Acute and chronic respiratory failure with hypoxia: Secondary | ICD-10-CM

## 2021-05-15 DIAGNOSIS — S82201A Unspecified fracture of shaft of right tibia, initial encounter for closed fracture: Secondary | ICD-10-CM

## 2021-05-15 DIAGNOSIS — R410 Disorientation, unspecified: Secondary | ICD-10-CM | POA: Diagnosis not present

## 2021-05-15 DIAGNOSIS — N1831 Chronic kidney disease, stage 3a: Secondary | ICD-10-CM

## 2021-05-15 DIAGNOSIS — I639 Cerebral infarction, unspecified: Secondary | ICD-10-CM | POA: Diagnosis not present

## 2021-05-15 DIAGNOSIS — S82401A Unspecified fracture of shaft of right fibula, initial encounter for closed fracture: Secondary | ICD-10-CM

## 2021-05-15 DIAGNOSIS — W19XXXS Unspecified fall, sequela: Secondary | ICD-10-CM | POA: Diagnosis not present

## 2021-05-15 DIAGNOSIS — T148XXA Other injury of unspecified body region, initial encounter: Secondary | ICD-10-CM

## 2021-05-15 DIAGNOSIS — Z794 Long term (current) use of insulin: Secondary | ICD-10-CM

## 2021-05-15 DIAGNOSIS — E1169 Type 2 diabetes mellitus with other specified complication: Secondary | ICD-10-CM

## 2021-05-15 DIAGNOSIS — I959 Hypotension, unspecified: Secondary | ICD-10-CM

## 2021-05-15 DIAGNOSIS — J9622 Acute and chronic respiratory failure with hypercapnia: Secondary | ICD-10-CM

## 2021-05-15 LAB — CBC WITH DIFFERENTIAL/PLATELET
Abs Immature Granulocytes: 0.03 10*3/uL (ref 0.00–0.07)
Basophils Absolute: 0 10*3/uL (ref 0.0–0.1)
Basophils Relative: 1 %
Eosinophils Absolute: 0.1 10*3/uL (ref 0.0–0.5)
Eosinophils Relative: 1 %
HCT: 27.5 % — ABNORMAL LOW (ref 36.0–46.0)
Hemoglobin: 9 g/dL — ABNORMAL LOW (ref 12.0–15.0)
Immature Granulocytes: 1 %
Lymphocytes Relative: 9 %
Lymphs Abs: 0.5 10*3/uL — ABNORMAL LOW (ref 0.7–4.0)
MCH: 30.2 pg (ref 26.0–34.0)
MCHC: 32.7 g/dL (ref 30.0–36.0)
MCV: 92.3 fL (ref 80.0–100.0)
Monocytes Absolute: 0.5 10*3/uL (ref 0.1–1.0)
Monocytes Relative: 8 %
Neutro Abs: 4.8 10*3/uL (ref 1.7–7.7)
Neutrophils Relative %: 80 %
Platelets: 176 10*3/uL (ref 150–400)
RBC: 2.98 MIL/uL — ABNORMAL LOW (ref 3.87–5.11)
RDW: 12.2 % (ref 11.5–15.5)
WBC: 6 10*3/uL (ref 4.0–10.5)
nRBC: 0 % (ref 0.0–0.2)

## 2021-05-15 LAB — POCT I-STAT 7, (LYTES, BLD GAS, ICA,H+H)
Acid-Base Excess: 8 mmol/L — ABNORMAL HIGH (ref 0.0–2.0)
Bicarbonate: 32.6 mmol/L — ABNORMAL HIGH (ref 20.0–28.0)
Calcium, Ion: 1.29 mmol/L (ref 1.15–1.40)
HCT: 25 % — ABNORMAL LOW (ref 36.0–46.0)
Hemoglobin: 8.5 g/dL — ABNORMAL LOW (ref 12.0–15.0)
O2 Saturation: 98 %
Patient temperature: 98.9
Potassium: 4.1 mmol/L (ref 3.5–5.1)
Sodium: 145 mmol/L (ref 135–145)
TCO2: 34 mmol/L — ABNORMAL HIGH (ref 22–32)
pCO2 arterial: 46.2 mmHg (ref 32.0–48.0)
pH, Arterial: 7.457 — ABNORMAL HIGH (ref 7.350–7.450)
pO2, Arterial: 96 mmHg (ref 83.0–108.0)

## 2021-05-15 LAB — BASIC METABOLIC PANEL
Anion gap: 12 (ref 5–15)
BUN: 26 mg/dL — ABNORMAL HIGH (ref 8–23)
CO2: 31 mmol/L (ref 22–32)
Calcium: 9.4 mg/dL (ref 8.9–10.3)
Chloride: 105 mmol/L (ref 98–111)
Creatinine, Ser: 1.55 mg/dL — ABNORMAL HIGH (ref 0.44–1.00)
GFR, Estimated: 34 mL/min — ABNORMAL LOW (ref >60)
Glucose, Bld: 176 mg/dL — ABNORMAL HIGH (ref 70–99)
Potassium: 4.1 mmol/L (ref 3.5–5.1)
Sodium: 148 mmol/L — ABNORMAL HIGH (ref 135–145)

## 2021-05-15 LAB — GLUCOSE, CAPILLARY
Glucose-Capillary: 125 mg/dL — ABNORMAL HIGH (ref 70–99)
Glucose-Capillary: 134 mg/dL — ABNORMAL HIGH (ref 70–99)
Glucose-Capillary: 149 mg/dL — ABNORMAL HIGH (ref 70–99)
Glucose-Capillary: 175 mg/dL — ABNORMAL HIGH (ref 70–99)
Glucose-Capillary: 176 mg/dL — ABNORMAL HIGH (ref 70–99)
Glucose-Capillary: 225 mg/dL — ABNORMAL HIGH (ref 70–99)

## 2021-05-15 LAB — MAGNESIUM: Magnesium: 2.3 mg/dL (ref 1.7–2.4)

## 2021-05-15 LAB — PROCALCITONIN: Procalcitonin: 0.1 ng/mL

## 2021-05-15 LAB — TSH: TSH: 0.733 u[IU]/mL (ref 0.350–4.500)

## 2021-05-15 LAB — PHOSPHORUS: Phosphorus: 2.8 mg/dL (ref 2.5–4.6)

## 2021-05-15 MED ORDER — METOPROLOL TARTRATE 5 MG/5ML IV SOLN
5.0000 mg | Freq: Four times a day (QID) | INTRAVENOUS | Status: DC
  Administered 2021-05-16: 2.5 mg via INTRAVENOUS
  Filled 2021-05-15: qty 5

## 2021-05-15 MED ORDER — HALOPERIDOL LACTATE 5 MG/ML IJ SOLN
INTRAMUSCULAR | Status: AC
  Filled 2021-05-15: qty 1

## 2021-05-15 MED ORDER — AMIODARONE LOAD VIA INFUSION
150.0000 mg | Freq: Once | INTRAVENOUS | Status: AC
  Administered 2021-05-15: 150 mg via INTRAVENOUS
  Filled 2021-05-15: qty 83.34

## 2021-05-15 MED ORDER — AMIODARONE HCL IN DEXTROSE 360-4.14 MG/200ML-% IV SOLN
30.0000 mg/h | INTRAVENOUS | Status: DC
  Administered 2021-05-16: 30 mg/h via INTRAVENOUS
  Filled 2021-05-15: qty 200

## 2021-05-15 MED ORDER — LORAZEPAM 2 MG/ML IJ SOLN
1.0000 mg | Freq: Once | INTRAMUSCULAR | Status: AC
  Administered 2021-05-15: 1 mg via INTRAVENOUS
  Filled 2021-05-15: qty 1

## 2021-05-15 MED ORDER — HYDRALAZINE HCL 20 MG/ML IJ SOLN
10.0000 mg | Freq: Four times a day (QID) | INTRAMUSCULAR | Status: DC | PRN
  Administered 2021-05-15: 10 mg via INTRAVENOUS
  Filled 2021-05-15: qty 1

## 2021-05-15 MED ORDER — HALOPERIDOL LACTATE 5 MG/ML IJ SOLN
2.0000 mg | INTRAMUSCULAR | Status: DC | PRN
  Administered 2021-05-15: 2 mg via INTRAVENOUS

## 2021-05-15 MED ORDER — FUROSEMIDE 10 MG/ML IJ SOLN
40.0000 mg | Freq: Once | INTRAMUSCULAR | Status: AC
Start: 2021-05-15 — End: 2021-05-15
  Administered 2021-05-15: 40 mg via INTRAVENOUS
  Filled 2021-05-15: qty 4

## 2021-05-15 MED ORDER — AMIODARONE HCL IN DEXTROSE 360-4.14 MG/200ML-% IV SOLN
60.0000 mg/h | INTRAVENOUS | Status: DC
  Administered 2021-05-15 (×2): 60 mg/h via INTRAVENOUS
  Filled 2021-05-15 (×2): qty 200

## 2021-05-15 MED ORDER — REVEFENACIN 175 MCG/3ML IN SOLN
175.0000 ug | Freq: Every day | RESPIRATORY_TRACT | Status: DC
  Administered 2021-05-16 – 2021-05-26 (×10): 175 ug via RESPIRATORY_TRACT
  Filled 2021-05-15 (×15): qty 3

## 2021-05-15 NOTE — TOC Progression Note (Signed)
Transition of Care [facility]) - Initial/Assessment Note    Patient Details  Name: Caitlin Oliver MRN: [id number] Date of Birth: April 26, 1940  Transition of Care [facility]) CM/SW Contact:    Milinda Antis, [place] Phone Number: 05/15/2021, 2:04 PM  Clinical Narrative:                  CSW spoke with patient in an attempt to complete risk reassessment screening.  The patient was unable to provide information as patient was confused during attempt.  CSW contacted the patient's niece, [name], who informed CSW that the patient is from home alone and assisted CSW with completing screening.    Transition of Care Department [facility]) has reviewed patient and no TOC needs have been identified at this time. We will continue to monitor patient advancement through interdisciplinary progression rounds. If new patient transition needs arise, please place a TOC consult.         Patient Goals and CMS Choice        Expected Discharge Plan and Services                                                Prior Living Arrangements/Services                       Activities of Daily Living Home Assistive Devices/Equipment: Gilford Rile (specify type) ADL Screening (condition at time of admission) Patient's cognitive ability adequate to safely complete daily activities?: Yes Is the patient deaf or have difficulty hearing?: No Does the patient have difficulty seeing, even when wearing glasses/contacts?: No Does the patient have difficulty concentrating, remembering, or making decisions?: No Patient able to express need for assistance with ADLs?: Yes Does the patient have difficulty dressing or bathing?: No Independently performs ADLs?: Yes (appropriate for developmental age) Does the patient have difficulty walking or climbing stairs?: Yes Weakness of Legs: Right Weakness of Arms/Hands: None  Permission Sought/Granted                  Emotional Assessment              Admission  diagnosis:  Fall [W19.XXXA] Tibia/fibula fracture [id number], S82.409A] Patient Active Problem List   Diagnosis Date Noted   Tibia/fibula fracture 05/12/2021   Abdominal pain 12/08/2019   Acute on chronic heart failure with preserved ejection fraction (HFpEF) (HCC)    Unstable angina (HCC)    Acute on chronic combined systolic and diastolic CHF (congestive heart failure) ([place]) 11/13/2018   Acute CVA (cerebrovascular accident) ([place]) 06/17/2018   Syncope 06/16/2018   Chest pain 06/16/2018   Pain 09/23/2017   Cervicalgia 07/16/2017   Pain in both upper extremities 06/03/2017   Pain in both lower extremities 06/03/2017   Bilateral carotid artery stenosis 06/03/2017   Benign paroxysmal positional vertigo 10/23/2016   Headache 06/19/2016   Cancer of left lung ([place]) 02/03/2016   COPD GOLD II 12/08/2015   Solitary pulmonary nodule 12/08/2015   Bilateral occipital neuralgia 11/22/2015   Double vision 10/28/2015   TIA (transient ischemic attack) 10/28/2015   Subclavian artery stenosis, left (HCC) 10/28/2015   Subclavian steal syndrome 10/28/2015   Acute blood loss anemia 10/20/2013   C. difficile colitis 10/19/2013   GI bleed 10/18/2013   COPD 10/18/2013   Bleeding gastrointestinal 10/18/2013   Hypoxia 10/10/2013   Bronchitis 10/08/2013  PVD (peripheral vascular disease) with claudication ([place]) 07/29/2013   Carotid stenosis 05/07/2013   Stenosis of left carotid artery 05/07/2013   Occlusion and stenosis of carotid artery without mention of cerebral infarction 03/02/2013   Carotid artery obstruction 03/02/2013   Carotid artery disease ([place]) 02/27/2013   Tobacco abuse 02/27/2013   Compulsive tobacco user syndrome 02/27/2013   Claudication ([place]) 01/20/2013   Essential hypertension 01/20/2013   Type 2 diabetes mellitus ([place]) 01/20/2013   Hyperlipidemia 01/20/2013   PCP:  [name], PA-C Pharmacy:   [place], [place] - [address] [place] [address] Phone: [phone] Fax: [place], [place] - [address] [place] [address] Phone: [phone] Fax: [phone]     Social Determinants of Health (SDOH) Interventions    Readmission Risk Interventions Readmission Risk Prevention Plan 05/15/2021 11/17/2018  Transportation Screening Complete Complete  PCP or Specialist Appt within 3-5 Days Not Complete Not Complete  Not Complete comments not ready for d/c not ready for dc  HRI or [place] Complete Complete  Social Work Consult for [place] Planning/Counseling Complete Complete  Palliative Care Screening Not Applicable Not Applicable  Medication Review (RN Care Manager) Referral to Pharmacy Patient Refused  Some recent data might be hidden

## 2021-05-15 NOTE — Progress Notes (Signed)
RT NOTES: Removed patient from bipap and placed on 3lpm nasal cannula. Sats 98%. Will continue to monitor.

## 2021-05-15 NOTE — Progress Notes (Signed)
Patient ID: Caitlin Oliver, female   DOB: June 12, 1940, 81 y.o.   MRN: [id number] The patient is alert, but still quite confused.  She did wiggle her toes on her right foot and her right leg splint is intact.  I have consulted the Ortho Trauma Service (Dr. Haddix/Dr. Marcelino Scot) to see if they can consider surgery on her later this week pending medical clearance.  Apparently, a brain MRI does show evidence of a recent stroke.

## 2021-05-15 NOTE — Assessment & Plan Note (Signed)
No wheezing at present. Quit smoking.  Severe pulmonary hypertension by echo now and in 2020, but normal RHC in 2021.  Plan: - suspect pulmonary hypertension due to HFpEF (pulmonary venous hypertension) - trial of gentle diuresis

## 2021-05-15 NOTE — Progress Notes (Signed)
.  ramio  Amiodarone Drug - Drug Interaction Consult Note  Recommendations:  Monitor QTc, monitor and replace K, Mg while on haloperidol  Monitor QTc, monitor and replace K, Mg while on ondansetron  Amiodarone is metabolized by the cytochrome P450 system and therefore has the potential to cause many drug interactions. Amiodarone has an average plasma half-life of 50 days (range 20 to 100 days).   There is potential for drug interactions to occur several weeks or months after stopping treatment and the onset of drug interactions may be slow after initiating amiodarone.   _0  Statins: Increased risk of myopathy. Simvastatin- restrict dose to 46m daily. Other statins: counsel patients to report any muscle pain or weakness immediately.  _1  Anticoagulants: Amiodarone can increase anticoagulant effect. Consider warfarin dose reduction. Patients should be monitored closely and the dose of anticoagulant altered accordingly, remembering that amiodarone levels take several weeks to stabilize.  _2  Antiepileptics: Amiodarone can increase plasma concentration of phenytoin, the dose should be reduced. Note that small changes in phenytoin dose can result in large changes in levels. Monitor patient and counsel on signs of toxicity.  _3  Beta blockers: increased risk of bradycardia, AV block and myocardial depression. Sotalol - avoid concomitant use.  _4   Calcium channel blockers (diltiazem and verapamil): increased risk of bradycardia, AV block and myocardial depression.  _5   Cyclosporine: Amiodarone increases levels of cyclosporine. Reduced dose of cyclosporine is recommended.  _6  Digoxin dose should be halved when amiodarone is started.  _7  Diuretics: increased risk of cardiotoxicity if hypokalemia occurs.  _8  Oral hypoglycemic agents (glyburide, glipizide, glimepiride): increased risk of hypoglycemia. Patient's glucose levels should be monitored closely when initiating amiodarone therapy.    _9  Drugs that prolong the QT interval:  Torsades de pointes risk may be increased with concurrent use - avoid if possible.  Monitor QTc, also keep magnesium/potassium WNL if concurrent therapy can't be avoided.  Antibiotics: e.g. fluoroquinolones, erythromycin.  Antiarrhythmics: e.g. quinidine, procainamide, disopyramide, sotalol.  Antipsychotics: e.g. phenothiazines, haloperidol.   Lithium, tricyclic antidepressants, and methadone.  Ondansetron  DGillermina Hu PharmD, BCPS, FNew Lexington Clinic PscClinical Pharmacist 05/15/2021 2:34 PM

## 2021-05-15 NOTE — Progress Notes (Cosign Needed Addendum)
NAME:  Caitlin Oliver, MRN:  [id number], DOB:  August 07, 1940, LOS: 3 ADMISSION DATE:  05/12/2021, CONSULTATION DATE: 05/13/2021 REFERRING MD: Dr. Mila Homer, CHIEF COMPLAINT: Hypotension  History of Present Illness:  81 year old female with multiple medical problems significant for type 2 diabetes, carotid artery stenosis status post bilateral endarterectomies, extensive PAD, pulmonary hypertension, and COPD.  She wears 3 L of oxygen at chronically.  She lives at home alone and uses a walker.  She gets winded easily after ambulating for short period of time.  She presented most emergency department on 1/27 with complaints of fall at home.  She fell while in the bathroom states she got dizzy but also her leg gave out.  She was unable to get off the floor due to extreme pain in the right lower extremity.  She was able to make to the phone and called EMS.  Of note she did fall 1 week prior after experiencing dizziness for which she presented to the emergency department suffering a head laceration which was stapled.  Also of note she is on aspirin and Plavix.  Initially in the emergency department she was hypertensive and tachypneic.  She also had deformity of the right lower extremity with bruising over the lateral aspect.  Imaging of the right lower extremity demonstrated severe tib-fib fracture and high risk for compartment syndrome.  She is admitted to the hospital for surgical management.  Orthopedics was consulted and she was scheduled for surgery on 1/28, however during the preop assessment she was noted to be hypotensive and lethargic.  Hypotension did not improve with IV fluids and norepinephrine was started.  Surgery was aborted and PCCM was consulted for ICU transfer.  Pertinent  Medical History   Past Medical History:  Diagnosis Date   Anxiety    Arthritis    Cancer ([place])    lung cancer   Carotid artery disease (HCC)    Cataracts, bilateral    CHF (congestive heart failure) (HCC)     Claudication (HCC)    COPD (chronic obstructive pulmonary disease) ([place])    Diabetes mellitus    Dizzy    Family history of adverse reaction to anesthesia    Pt nephew has PONV   GERD (gastroesophageal reflux disease)    Headache    History of kidney stones    Hypercholesteremia    Hypertension    Peripheral arterial disease (HCC)    bilateral iliac artery stenosis by angiography   Pneumonia    Stomach ulcer    Stroke [facility])    Tobacco abuse    Wears glasses    Significant Hospital Events: Including procedures, antibiotic start and stop dates in addition to other pertinent events   1/27 admit for R tib/fib closed fx 1/27 head CT negative CT C-spine -degenerative disease C5/6 1/28 fever, tachycardic, hypotensive, lethargic, surgery cancelled re shock 1/30 Required BiPAP overnight, due to mask incompatibility initially unable to obtain MRI.  CT Head NAICA. MRI with atrophy, chronic small vessel ischemic changes, acute punctate white matter infarct of L frontal lobe. Acutely agitated and tachycardic to 170s in early afternoon, improved with Precedex/Ativan/Haldol. XR tib/fib mildly displaced/angulated fx.  Interim History / Subjective:  Feeling well this AM Pleasantly confused on morning rounds, following commands Required BiPAP overnight, due to mask incompatibility initially unable to obtain MRI CT Head NAICA MRI with atrophy, chronic small vessel ischemic changes, acute punctate white matter infarct of L frontal lobe Acutely agitated and tachycardic to 170s in early afternoon, improved  with Precedex/Ativan/Haldol EKG sinus tach, nonspecific ST changes Repeat CXR XR tib/fib mildly displaced/angulated fx.  Objective   Blood pressure (!) 200/98, pulse (!) 127, temperature 97.8 F (36.6 C), temperature source Oral, resp. rate (!) 30, height _0  (1.626 m), weight 79.3 kg, SpO2 95 %.    Vent Mode: PCV FiO2 (%):  [30 %-35 %] 30 % Set Rate:  [14 bmp] 14 bmp PEEP:  [6 cmH20] 6  cmH20   Intake/Output Summary (Last 24 hours) at 05/15/2021 0747 Last data filed at 05/15/2021 0600 Gross per 24 hour  Intake 306.04 ml  Output 1000 ml  Net -693.96 ml    Filed Weights   05/12/21 2053  Weight: 79.3 kg   Physical Examination: General: Acutely ill-appearing elderly woman in NAD. HEENT: [place]/AT, anicteric sclera, PERRL, dry mucous membranes. Slight R facial droop? Neuro: Awake, oriented x 2-3. Responds to verbal stimuli. Following commands consistently. Moves all 4 extremities spontaneously. Some word repetition, improved in early afternoon. CV: Tachycardic, regular rhythm, no m/g/r. PULM: Breathing even and unlabored on 4LNC. Lung fields CTAB. GI: Soft, nontender, nondistended. Normoactive bowel sounds. Extremities: No significant LE edema noted. Skin: Warm/dry, no rashes.  Resolved Hospital Problem List:    Assessment & Plan:   Acute metabolic encephalopathy Likely multifactorial in the setting of long-term benzo use.  CT head NAICA. MRI 1/30 with atrophy, chronic small vessel ischemic changes and acute punctate infarct of left frontal lobe, no significant stroke. - Continue Precedex - Continue Ativan, Haldol as needed - Consider Neuro consult if ongoing mental status changes  Undifferentiated shock Pulmonary hypertension Etiology unclear. She had two episodes of syncope recently,?  Pulmonary hypertension contributing. Febrile 1/28 to Tmax 102F. WBC 12.4, raises question of septic shock but no apparent source; query left lower lobe pneumonia. Echo 1/29 with EF 65 to 30%, grade 1 diastolic dysfunction, severely elevated pulmonary artery systolic pressure, RVSP 86.5 mmHg. - Trend WBC, fever curve - Continue empiric ceftriaxone - Follow-up repeat procalcitonin - Follow-up finalized culture data  Closed fracture of R tib-fib, present on admission Repeat tib/fib XR demonstrating mildly displaced and angulated fractures. - Management per orthopedics - Monitor for  signs and symptoms of compartment syndrome - Surgery postponed in the setting of shock, hemodynamic instability - Holding Plavix - Recommend spinal versus general anesthesia in the setting of pulmonary hypertension - Tentative plan to proceed to the OR with Ortho Trauma this week pending clinical stability  Chronic hypoxic respiratory failure PAH Chronic HFpEF. Echo as above demonstrating severely elevated pulmonary artery pressure, RVSP 78.2 mmHg. - Continue supplemental oxygen, wears 3 L at baseline - Wean O2 for sat > 90% - Pulmonary hygiene - Continue empiric ceftriaxone - Intermittent CXR - Hold diuretics in setting of shock, resume as clinically appropriate  CKD stage IIIa - Trend BMP - Replete electrolytes as indicated - Monitor I&Os - Avoid nephrotoxic agents as able - Ensure adequate renal perfusion  DM2 - CBG monitoring and SSI  Best Practice (right click and "Reselect all SmartList Selections" daily)   Diet/type: NPO DVT prophylaxis: prophylactic heparin  GI prophylaxis: PPI Lines: N/A Foley:  N/A or Code Status:  DNR Last date of multidisciplinary goals of care discussion [1/30 - Patient remains DNR ]  Critical care time: 82 minutes   Lestine Mount, PA-C [facility] 05/15/21 7:47 AM  Please see [email or web address] for pager details.  From 7A-7P if no response, please call [phone] After hours, please call ELink [phone]

## 2021-05-15 NOTE — Assessment & Plan Note (Signed)
Due to fall at home.  Chronic Plavix use precludes spinal anesthesia.  Plan: - Given precarious medical condition, may be best to manage fracture medically as would require general anesthesia.

## 2021-05-15 NOTE — Evaluation (Signed)
Clinical/Bedside Swallow Evaluation Patient Details  Name: Caitlin Oliver MRN: [id number] Date of Birth: September 14, 1940  Today's Date: 05/15/2021 Time: SLP Start Time (ACUTE ONLY): 69 SLP Stop Time (ACUTE ONLY): 1543 SLP Time Calculation (min) (ACUTE ONLY): 37 min  Past Medical History:  Past Medical History:  Diagnosis Date   Anxiety    Arthritis    Cancer ([place])    lung cancer   Carotid artery disease (HCC)    Cataracts, bilateral    CHF (congestive heart failure) (HCC)    Claudication (HCC)    COPD (chronic obstructive pulmonary disease) ([place])    Diabetes mellitus    Dizzy    Family history of adverse reaction to anesthesia    Pt nephew has PONV   GERD (gastroesophageal reflux disease)    Headache    History of kidney stones    Hypercholesteremia    Hypertension    Peripheral arterial disease ([place])    bilateral iliac artery stenosis by angiography   Pneumonia    Stomach ulcer    Stroke ([place])    Tobacco abuse    Wears glasses    Past Surgical History:  Past Surgical History:  Procedure Laterality Date   ABDOMINAL AORTIC ENDOVASCULAR STENT GRAFT Right 06/19/2019   Procedure: INNOMINATE STENT, RIGHT CAROTID STENT, RIGHT SUBCLAVIAN STENT AND CAROTID CUTDOWN;  Surgeon: Marty Heck, MD;  Location: [place];  Service: Vascular;  Laterality: Right;   ABDOMINAL HYSTERECTOMY     APPENDECTOMY     BREAST REDUCTION SURGERY     CAROTID ANGIOGRAM N/A 02/25/2013   Procedure: CAROTID ANGIOGRAM;  Surgeon: Lorretta Harp, MD;  Location: [facility] CATH LAB;  Service: Cardiovascular;  Laterality: N/A;   ENDARTERECTOMY Right 03/06/2013   Procedure: ENDARTERECTOMY CAROTID-RIGHT;  Surgeon: Serafina Mitchell, MD;  Location: [place];  Service: Vascular;  Laterality: Right;   ENDARTERECTOMY Left 05/07/2013   Procedure: LEFT CAROTID ARTERY ENDARTERECTOMY WITH VASCU-GUARD PATCH ANGIOPLASTY ;  Surgeon: Serafina Mitchell, MD;  Location: [place];  Service: Vascular;  Laterality: Left;   INTRAOPERATIVE  ARTERIOGRAM N/A 06/19/2019   Procedure: Clydie Braun;  Surgeon: Marty Heck, MD;  Location: [place];  Service: Vascular;  Laterality: N/A;   IR ANGIO INTRA EXTRACRAN SEL COM CAROTID INNOMINATE UNI R MOD SED  05/14/2019   IR ANGIO VERTEBRAL SEL SUBCLAVIAN INNOMINATE BILAT MOD SED  05/14/2019   LOBECTOMY Left 02/03/2016   Procedure: LEFT UPPER LOBECTOMY;  Surgeon: [name], MD;  Location: [place];  Service: Thoracic;  Laterality: Left;   LOWER EXTREMITY ANGIOGRAM N/A 02/25/2013   Procedure: LOWER EXTREMITY ANGIOGRAM;  Surgeon: Lorretta Harp, MD;  Location[facility] CATH LAB;  Service: Cardiovascular;  Laterality: N/A;   LOWER EXTREMITY ANGIOGRAM N/A 07/27/2013   Procedure: LOWER EXTREMITY ANGIOGRAM;  Surgeon: Lorretta Harp, MD;  Location: [facility] CATH LAB;  Service: Cardiovascular;  Laterality: N/A;   PATCH ANGIOPLASTY Right 03/06/2013   Procedure: PATCH ANGIOPLASTY of Right Carotid Artery using Vascu-Guard Patch;  Surgeon: Serafina Mitchell, MD;  Location: [place];  Service: Vascular;  Laterality: Right;   PERIPHERAL VASCULAR CATHETERIZATION N/A 11/15/2015   Procedure: Aortic Arch Angiography;  Surgeon: Serafina Mitchell, MD;  Location: [place] CV LAB;  Service: Cardiovascular;  Laterality: N/A;   PERIPHERAL VASCULAR CATHETERIZATION Bilateral 11/15/2015   Procedure: Carotid Angiography;  Surgeon: Serafina Mitchell, MD;  Location: [place] CV LAB;  Service: Cardiovascular;  Laterality: Bilateral;   PERIPHERAL VASCULAR CATHETERIZATION Left 11/15/2015   Procedure: Upper  Extremity Angiography;  Surgeon: Serafina Mitchell, MD;  Location: [place] CV LAB;  Service: Cardiovascular;  Laterality: Left;   PERIPHERAL VASCULAR CATHETERIZATION Left 11/15/2015   Procedure: Peripheral Vascular Intervention;  Surgeon: Serafina Mitchell, MD;  Location: [place] CV LAB;  Service: Cardiovascular;  Laterality: Left;  subclavian    RIGHT HEART CATH N/A 06/16/2019   Procedure: RIGHT HEART CATH;  Surgeon: Jolaine Artist, MD;  Location: [place] CV LAB;  Service: Cardiovascular;  Laterality: N/A;   RIGHT/LEFT HEART CATH AND CORONARY ANGIOGRAPHY N/A 11/20/2018   Procedure: RIGHT/LEFT HEART CATH AND CORONARY ANGIOGRAPHY;  Surgeon: Nelva Bush, MD;  Location: [place] CV LAB;  Service: Cardiovascular;  Laterality: N/A;   SALIVARY GLAND SURGERY     scar tissue removed from left saliva glad   TUBAL LIGATION     ULTRASOUND GUIDANCE FOR VASCULAR ACCESS Right 06/19/2019   Procedure: Ultrasound Guidance For Vascular Access;  Surgeon: Marty Heck, MD;  Location: [place];  Service: Vascular;  Laterality: Right;   VIDEO ASSISTED THORACOSCOPY (VATS)/WEDGE RESECTION Left 02/03/2016   Procedure: VIDEO ASSISTED THORACOSCOPY;  Surgeon: [name], MD;  Location: [place];  Service: Thoracic;  Laterality: Left;   HPI:  81 year old female with multiple medical problems admitted 1/27 with severe tib/fib fxs of RLE; planned IM fixation but declined medically with lethargy,  hypercarbic respiratory failure; placed on BiPAP and surgery was cancelled.  Current dx include shock, acute left frontal CVA, acute hypoxic respiratory failure.  PMHx significant for type 2 diabetes, carotid artery stenosis status post bilateral endarterectomies, extensive PAD, pulmonary hypertension, and COPD on 3 L of oxygen at home.    Assessment / Plan / Recommendation  Clinical Impression  Pt participated in clinical swallow evaluation. Oral mechanism exam: mouth dry despite frequent oral care; torus palatinus on central hard palate, baseline; dentition present. Pt required several trials of ice chips/sips of water to generate the motor pattern for swallowing.  Once she "primed the pump" she swallowed quite normally with adequate mastication and oral control, the appearance of a brisk swallow response, and no s/s of aspiration over the course of two cups of water.    Recommend continuing regular solids, thin liquids; meds whole in  applesauce.  No SLP f/u for swallowing.   Pt demonstrated symptoms of an expressive aphasia with anomia, semantic and phonemic paraphasias, and awareness of deficits.  She will benefit from a speech/language evaluation and of course OT/PT evals when appropriate.  SLP Visit Diagnosis: Dysphagia, unspecified (R13.10)    Aspiration Risk  No limitations    Diet Recommendation   Regular solids, thin liquids  Medication Administration: Whole meds with puree    Other  Recommendations Oral Care Recommendations: Oral care BID    Recommendations for follow up therapy are one component of a multi-disciplinary discharge planning process, led by the attending physician.  Recommendations may be updated based on patient status, additional functional criteria and insurance authorization.  Follow up Recommendations Other (comment) (tba)   Needs speech/language evaluation due to aphasia   Assistance Recommended at Discharge Frequent or constant Supervision/Assistance  Functional Status Assessment  tba  Frequency and Duration N/a       Swallow Study   General Date of Onset: 05/12/21 HPI: 81 year old female with multiple medical problems admitted 1/27 with severe tib/fib fxs of RLE; planned IM fixation but declined medically with lethargy,  hypercarbic respiratory failure; placed on BiPAP and surgery was cancelled.  Current dx include shock, acute  left frontal CVA, acute hypoxic respiratory failure.  PMHx significant for type 2 diabetes, carotid artery stenosis status post bilateral endarterectomies, extensive PAD, pulmonary hypertension, and COPD on 3 L of oxygen at home. Type of Study: Bedside Swallow Evaluation Previous Swallow Assessment: no Diet Prior to this Study: Regular;Thin liquids Temperature Spikes Noted: No Respiratory Status: Nasal cannula History of Recent Intubation: No Behavior/Cognition: Alert;Cooperative Oral Cavity Assessment: Dry Oral Care Completed by SLP: Recent completion by  staff Oral Cavity - Dentition: Adequate natural dentition Vision: Functional for self-feeding Self-Feeding Abilities: Needs assist Patient Positioning: Upright in bed Baseline Vocal Quality: Normal Volitional Cough: Strong Volitional Swallow: Able to elicit    Oral/Motor/Sensory Function Overall Oral Motor/Sensory Function: Other (comment) (torus palatinus on hard palate)   Ice Chips Ice chips: Within functional limits   Thin Liquid Thin Liquid: Within functional limits    Nectar Thick Nectar Thick Liquid: Not tested   Honey Thick Honey Thick Liquid: Not tested   Puree Puree: Within functional limits   Solid     Solid: Within functional limits      Juan Quam Laurice 05/15/2021,3:56 PM  Estill Bamberg L. Tivis Ringer, [place] Office number [phone] Pager [phone]

## 2021-05-15 NOTE — Consult Note (Signed)
Reason for Consult:Right tibia fx Referring Physician: Lenice Llamas Time called: 1610 Time at bedside: [name] is an 80 y.o. female.  HPI: Caitlin Oliver suffered an unwitnessed fall. She was brought to the ED where x-rays showed a tib/fib fxs and orthopedic surgery was consulted. She was initially planned for IM fixation but declined medically and became encephalopathic and surgery was cancelled. She remains significantly encephalopathic but does seem to be improving per CCM. Given delay orthopedic trauma service was asked to assume care.  Past Medical History:  Diagnosis Date   Anxiety    Arthritis    Cancer ([place])    lung cancer   Carotid artery disease (HCC)    Cataracts, bilateral    CHF (congestive heart failure) (HCC)    Claudication (HCC)    COPD (chronic obstructive pulmonary disease) ([place])    Diabetes mellitus    Dizzy    Family history of adverse reaction to anesthesia    Pt nephew has PONV   GERD (gastroesophageal reflux disease)    Headache    History of kidney stones    Hypercholesteremia    Hypertension    Peripheral arterial disease (HCC)    bilateral iliac artery stenosis by angiography   Pneumonia    Stomach ulcer    Stroke ([place])    Tobacco abuse    Wears glasses     Past Surgical History:  Procedure Laterality Date   ABDOMINAL AORTIC ENDOVASCULAR STENT GRAFT Right 06/19/2019   Procedure: INNOMINATE STENT, RIGHT CAROTID STENT, RIGHT SUBCLAVIAN STENT AND CAROTID CUTDOWN;  Surgeon: Marty Heck, MD;  Location: [place];  Service: Vascular;  Laterality: Right;   ABDOMINAL HYSTERECTOMY     APPENDECTOMY     BREAST REDUCTION SURGERY     CAROTID ANGIOGRAM N/A 02/25/2013   Procedure: CAROTID ANGIOGRAM;  Surgeon: Lorretta Harp, MD;  Location: [facility] CATH LAB;  Service: Cardiovascular;  Laterality: N/A;   ENDARTERECTOMY Right 03/06/2013   Procedure: ENDARTERECTOMY CAROTID-RIGHT;  Surgeon: Serafina Mitchell, MD;  Location: [place];  Service: Vascular;   Laterality: Right;   ENDARTERECTOMY Left 05/07/2013   Procedure: LEFT CAROTID ARTERY ENDARTERECTOMY WITH VASCU-GUARD PATCH ANGIOPLASTY ;  Surgeon: Serafina Mitchell, MD;  Location: [place];  Service: Vascular;  Laterality: Left;   INTRAOPERATIVE ARTERIOGRAM N/A 06/19/2019   Procedure: Clydie Braun;  Surgeon: Marty Heck, MD;  Location: [place];  Service: Vascular;  Laterality: N/A;   IR ANGIO INTRA EXTRACRAN SEL COM CAROTID INNOMINATE UNI R MOD SED  05/14/2019   IR ANGIO VERTEBRAL SEL SUBCLAVIAN INNOMINATE BILAT MOD SED  05/14/2019   LOBECTOMY Left 02/03/2016   Procedure: LEFT UPPER LOBECTOMY;  Surgeon: [name], MD;  Location: [place];  Service: Thoracic;  Laterality: Left;   LOWER EXTREMITY ANGIOGRAM N/A 02/25/2013   Procedure: LOWER EXTREMITY ANGIOGRAM;  Surgeon: Lorretta Harp, MD;  Location: [facility] CATH LAB;  Service: Cardiovascular;  Laterality: N/A;   LOWER EXTREMITY ANGIOGRAM N/A 07/27/2013   Procedure: LOWER EXTREMITY ANGIOGRAM;  Surgeon: Lorretta Harp, MD;  Location: [facility] CATH LAB;  Service: Cardiovascular;  Laterality: N/A;   PATCH ANGIOPLASTY Right 03/06/2013   Procedure: PATCH ANGIOPLASTY of Right Carotid Artery using Vascu-Guard Patch;  Surgeon: Serafina Mitchell, MD;  Location: [place];  Service: Vascular;  Laterality: Right;   PERIPHERAL VASCULAR CATHETERIZATION N/A 11/15/2015   Procedure: Aortic Arch Angiography;  Surgeon: Serafina Mitchell, MD;  Location: [place] CV LAB;  Service: Cardiovascular;  Laterality: N/A;  PERIPHERAL VASCULAR CATHETERIZATION Bilateral 11/15/2015   Procedure: Carotid Angiography;  Surgeon: Serafina Mitchell, MD;  Location: [place] CV LAB;  Service: Cardiovascular;  Laterality: Bilateral;   PERIPHERAL VASCULAR CATHETERIZATION Left 11/15/2015   Procedure: Upper Extremity Angiography;  Surgeon: Serafina Mitchell, MD;  Location: [place] CV LAB;  Service: Cardiovascular;  Laterality: Left;   PERIPHERAL VASCULAR CATHETERIZATION Left 11/15/2015    Procedure: Peripheral Vascular Intervention;  Surgeon: Serafina Mitchell, MD;  Location: [place] CV LAB;  Service: Cardiovascular;  Laterality: Left;  subclavian    RIGHT HEART CATH N/A 06/16/2019   Procedure: RIGHT HEART CATH;  Surgeon: Jolaine Artist, MD;  Location: [place] CV LAB;  Service: Cardiovascular;  Laterality: N/A;   RIGHT/LEFT HEART CATH AND CORONARY ANGIOGRAPHY N/A 11/20/2018   Procedure: RIGHT/LEFT HEART CATH AND CORONARY ANGIOGRAPHY;  Surgeon: Nelva Bush, MD;  Location: [place] CV LAB;  Service: Cardiovascular;  Laterality: N/A;   SALIVARY GLAND SURGERY     scar tissue removed from left saliva glad   TUBAL LIGATION     ULTRASOUND GUIDANCE FOR VASCULAR ACCESS Right 06/19/2019   Procedure: Ultrasound Guidance For Vascular Access;  Surgeon: Marty Heck, MD;  Location: [place];  Service: Vascular;  Laterality: Right;   VIDEO ASSISTED THORACOSCOPY (VATS)/WEDGE RESECTION Left 02/03/2016   Procedure: VIDEO ASSISTED THORACOSCOPY;  Surgeon: [name], MD;  Location: [place];  Service: Thoracic;  Laterality: Left;    Family History  Problem Relation Age of Onset   Stroke Mother    Hypertension Mother    Kidney failure Father    Lung disease Neg Hx     Social History:  reports that she quit smoking about 7 years ago. Her smoking use included cigarettes. She has a 84.00 pack-year smoking history. She quit smokeless tobacco use about 8 years ago. She reports that she does not drink alcohol and does not use drugs.  Allergies:  Allergies  Allergen Reactions   Fentanyl Shortness Of Breath    Medications: I have reviewed the patient's current medications.  Results for orders placed or performed during the hospital encounter of 05/12/21 (from the past 48 hour(s))  Glucose, capillary     Status: Abnormal   Collection Time: 05/13/21 12:00 PM  Result Value Ref Range   Glucose-Capillary 167 (H) 70 - 99 mg/dL    Comment: Glucose reference range applies  only to samples taken after fasting for at least 8 hours.  CBC     Status: Abnormal   Collection Time: 05/13/21 12:28 PM  Result Value Ref Range   WBC 17.9 (H) 4.0 - 10.5 K/uL   RBC 3.76 (L) 3.87 - 5.11 MIL/uL   Hemoglobin 11.6 (L) 12.0 - 15.0 g/dL   HCT 35.4 (L) 36.0 - 46.0 %   MCV 94.1 80.0 - 100.0 fL   MCH 30.9 26.0 - 34.0 pg   MCHC 32.8 30.0 - 36.0 g/dL   RDW 12.2 11.5 - 15.5 %   Platelets 218 150 - 400 K/uL   nRBC 0.0 0.0 - 0.2 %    Comment: Performed at [facility], [place] [address], [place], [place] [address]  Hemoglobin A1c     Status: Abnormal   Collection Time: 05/13/21 12:28 PM  Result Value Ref Range   Hgb A1c MFr Bld 7.0 (H) 4.8 - 5.6 %    Comment: (NOTE) Pre diabetes:          5.7%-6.4%  Diabetes:              >  6.4%  Glycemic control for   <7.0% adults with diabetes    Mean Plasma Glucose 154.2 mg/dL    Comment: Performed at [facility] [address], [place], [place] [address]  Culture, blood (routine x 2)     Status: None (Preliminary result)   Collection Time: 05/13/21 12:29 PM   Specimen: BLOOD  Result Value Ref Range   Specimen Description BLOOD THUMB    Special Requests      BOTTLES DRAWN AEROBIC AND ANAEROBIC Blood Culture results may not be optimal due to an inadequate volume of blood received in culture bottles   Culture      NO GROWTH < 24 HOURS Performed at [facility] [address], [place], [place] [address]    Report Status PENDING   Culture, blood (routine x 2)     Status: None (Preliminary result)   Collection Time: 05/13/21 12:36 PM   Specimen: BLOOD RIGHT HAND  Result Value Ref Range   Specimen Description BLOOD RIGHT HAND    Special Requests      BOTTLES DRAWN AEROBIC AND ANAEROBIC Blood Culture adequate volume   Culture      NO GROWTH < 24 HOURS Performed at [facility], [place] [address], [place], [place] [address]    Report Status PENDING   Glucose, capillary     Status: Abnormal   Collection Time:  05/13/21  3:29 PM  Result Value Ref Range   Glucose-Capillary 141 (H) 70 - 99 mg/dL    Comment: Glucose reference range applies only to samples taken after fasting for at least 8 hours.  Glucose, capillary     Status: Abnormal   Collection Time: 05/13/21  7:08 PM  Result Value Ref Range   Glucose-Capillary 152 (H) 70 - 99 mg/dL    Comment: Glucose reference range applies only to samples taken after fasting for at least 8 hours.  Glucose, capillary     Status: Abnormal   Collection Time: 05/13/21 11:04 PM  Result Value Ref Range   Glucose-Capillary 154 (H) 70 - 99 mg/dL    Comment: Glucose reference range applies only to samples taken after fasting for at least 8 hours.  CBC     Status: Abnormal   Collection Time: 05/14/21  1:00 AM  Result Value Ref Range   WBC 13.0 (H) 4.0 - 10.5 K/uL   RBC 3.64 (L) 3.87 - 5.11 MIL/uL   Hemoglobin 11.1 (L) 12.0 - 15.0 g/dL   HCT 34.8 (L) 36.0 - 46.0 %   MCV 95.6 80.0 - 100.0 fL   MCH 30.5 26.0 - 34.0 pg   MCHC 31.9 30.0 - 36.0 g/dL   RDW 12.2 11.5 - 15.5 %   Platelets 172 150 - 400 K/uL   nRBC 0.0 0.0 - 0.2 %    Comment: Performed at [facility], [place] [address] [place], [place] [address]  Basic metabolic panel     Status: Abnormal   Collection Time: 05/14/21  1:00 AM  Result Value Ref Range   Sodium 141 135 - 145 mmol/L   Potassium 4.6 3.5 - 5.1 mmol/L   Chloride 102 98 - 111 mmol/L   CO2 30 22 - 32 mmol/L   Glucose, Bld 154 (H) 70 - 99 mg/dL    Comment: Glucose reference range applies only to samples taken after fasting for at least 8 hours.   BUN 18 8 - 23 mg/dL   Creatinine, Ser 1.50 (H) 0.44 -  1.00 mg/dL   Calcium 8.6 (L) 8.9 - 10.3 mg/dL   GFR, Estimated 35 (L) >60 mL/min    Comment: (NOTE) Calculated using the CKD-EPI Creatinine Equation (2021)    Anion gap 9 5 - 15    Comment: Performed at [facility] [address], [place], [place] [address]  Magnesium     Status: None   Collection Time: 05/14/21  1:00 AM   Result Value Ref Range   Magnesium 2.0 1.7 - 2.4 mg/dL    Comment: Performed at [facility], [place] [address], [place], [place] [address]  Troponin I (High Sensitivity)     Status: Abnormal   Collection Time: 05/14/21  1:00 AM  Result Value Ref Range   Troponin I (High Sensitivity) 370 (HH) <18 ng/L    Comment: CRITICAL RESULT CALLED TO, READ BACK BY AND VERIFIED WITH: Wetzel Bjornstad RN 05/14/21 0932 M KOROLESKI (NOTE) Elevated high sensitivity troponin I (hsTnI) values and significant  changes across serial measurements may suggest ACS but many other  chronic and acute conditions are known to elevate hsTnI results.  Refer to the Links section for chest pain algorithms and additional  guidance. Performed at [facility], [place] [address], [place], [place] [address]   Procalcitonin - Baseline     Status: None   Collection Time: 05/14/21  1:00 AM  Result Value Ref Range   Procalcitonin 0.31 ng/mL    Comment:        Interpretation: PCT (Procalcitonin) <= 0.5 ng/mL: Systemic infection (sepsis) is not likely. Local bacterial infection is possible. (NOTE)       Sepsis PCT Algorithm           Lower Respiratory Tract                                      Infection PCT Algorithm    ----------------------------     ----------------------------         PCT < 0.25 ng/mL                PCT < 0.10 ng/mL          Strongly encourage             Strongly discourage   discontinuation of antibiotics    initiation of antibiotics    ----------------------------     -----------------------------       PCT 0.25 - 0.50 ng/mL            PCT 0.10 - 0.25 ng/mL               OR       >80% decrease in PCT            Discourage initiation of                                            antibiotics      Encourage discontinuation           of antibiotics    ----------------------------     -----------------------------         PCT >= 0.50 ng/mL              PCT 0.26 - 0.50 ng/mL  AND         <80% decrease in PCT             Encourage initiation of                                             antibiotics       Encourage continuation           of antibiotics    ----------------------------     -----------------------------        PCT >= 0.50 ng/mL                  PCT > 0.50 ng/mL               AND         increase in PCT                  Strongly encourage                                      initiation of antibiotics    Strongly encourage escalation           of antibiotics                                     -----------------------------                                           PCT <= 0.25 ng/mL                                                 OR                                        > 80% decrease in PCT                                      Discontinue / Do not initiate                                             antibiotics  Performed at [facility], [place] [address] [place], [place] [address]   Glucose, capillary     Status: Abnormal   Collection Time: 05/14/21  3:14 AM  Result Value Ref Range   Glucose-Capillary 136 (H) 70 - 99 mg/dL    Comment: Glucose reference range applies only to samples taken after fasting for at least 8 hours.  Glucose, capillary     Status: Abnormal   Collection Time: 05/14/21  7:32 AM  Result Value Ref Range   Glucose-Capillary 132 (H) 70 - 99 mg/dL    Comment:  Glucose reference range applies only to samples taken after fasting for at least 8 hours.  I-STAT 7, (LYTES, BLD GAS, ICA, H+H)     Status: Abnormal   Collection Time: 05/14/21  9:28 AM  Result Value Ref Range   pH, Arterial 7.299 (L) 7.350 - 7.450   pCO2 arterial 68.9 (HH) 32.0 - 48.0 mmHg   pO2, Arterial 107 83.0 - 108.0 mmHg   Bicarbonate 33.7 (H) 20.0 - 28.0 mmol/L   TCO2 36 (H) 22 - 32 mmol/L   O2 Saturation 97.0 %   Acid-Base Excess 5.0 (H) 0.0 - 2.0 mmol/L   Sodium 142 135 - 145 mmol/L   Potassium 4.5 3.5 - 5.1 mmol/L   Calcium, Ion 1.25 1.15 - 1.40 mmol/L    HCT 37.0 36.0 - 46.0 %   Hemoglobin 12.6 12.0 - 15.0 g/dL   Patient temperature 99.1 F    Collection site RADIAL, ALLEN'S TEST ACCEPTABLE    Drawn by Operator    Sample type ARTERIAL    Comment NOTIFIED PHYSICIAN   Glucose, capillary     Status: Abnormal   Collection Time: 05/14/21 11:24 AM  Result Value Ref Range   Glucose-Capillary 159 (H) 70 - 99 mg/dL    Comment: Glucose reference range applies only to samples taken after fasting for at least 8 hours.  Glucose, capillary     Status: Abnormal   Collection Time: 05/14/21  3:29 PM  Result Value Ref Range   Glucose-Capillary 158 (H) 70 - 99 mg/dL    Comment: Glucose reference range applies only to samples taken after fasting for at least 8 hours.  Glucose, capillary     Status: Abnormal   Collection Time: 05/14/21  7:15 PM  Result Value Ref Range   Glucose-Capillary 151 (H) 70 - 99 mg/dL    Comment: Glucose reference range applies only to samples taken after fasting for at least 8 hours.  Glucose, capillary     Status: Abnormal   Collection Time: 05/14/21 11:11 PM  Result Value Ref Range   Glucose-Capillary 133 (H) 70 - 99 mg/dL    Comment: Glucose reference range applies only to samples taken after fasting for at least 8 hours.  Procalcitonin     Status: None   Collection Time: 05/15/21  1:59 AM  Result Value Ref Range   Procalcitonin 0.10 ng/mL    Comment:        Interpretation: PCT (Procalcitonin) <= 0.5 ng/mL: Systemic infection (sepsis) is not likely. Local bacterial infection is possible. (NOTE)       Sepsis PCT Algorithm           Lower Respiratory Tract                                      Infection PCT Algorithm    ----------------------------     ----------------------------         PCT < 0.25 ng/mL                PCT < 0.10 ng/mL          Strongly encourage             Strongly discourage   discontinuation of antibiotics    initiation of antibiotics    ----------------------------      -----------------------------       PCT 0.25 - 0.50 ng/mL  PCT 0.10 - 0.25 ng/mL               OR       >80% decrease in PCT            Discourage initiation of                                            antibiotics      Encourage discontinuation           of antibiotics    ----------------------------     -----------------------------         PCT >= 0.50 ng/mL              PCT 0.26 - 0.50 ng/mL               AND        <80% decrease in PCT             Encourage initiation of                                             antibiotics       Encourage continuation           of antibiotics    ----------------------------     -----------------------------        PCT >= 0.50 ng/mL                  PCT > 0.50 ng/mL               AND         increase in PCT                  Strongly encourage                                      initiation of antibiotics    Strongly encourage escalation           of antibiotics                                     -----------------------------                                           PCT <= 0.25 ng/mL                                                 OR                                        > 80% decrease in PCT  Discontinue / Do not initiate                                             antibiotics  Performed at [facility], [place] [address], [place], [place] [address]   Glucose, capillary     Status: Abnormal   Collection Time: 05/15/21  3:18 AM  Result Value Ref Range   Glucose-Capillary 149 (H) 70 - 99 mg/dL    Comment: Glucose reference range applies only to samples taken after fasting for at least 8 hours.  I-STAT 7, (LYTES, BLD GAS, ICA, H+H)     Status: Abnormal   Collection Time: 05/15/21  4:34 AM  Result Value Ref Range   pH, Arterial 7.457 (H) 7.350 - 7.450   pCO2 arterial 46.2 32.0 - 48.0 mmHg   pO2, Arterial 96 83.0 - 108.0 mmHg   Bicarbonate 32.6 (H) 20.0 - 28.0 mmol/L   TCO2 34 (H) 22 -  32 mmol/L   O2 Saturation 98.0 %   Acid-Base Excess 8.0 (H) 0.0 - 2.0 mmol/L   Sodium 145 135 - 145 mmol/L   Potassium 4.1 3.5 - 5.1 mmol/L   Calcium, Ion 1.29 1.15 - 1.40 mmol/L   HCT 25.0 (L) 36.0 - 46.0 %   Hemoglobin 8.5 (L) 12.0 - 15.0 g/dL   Patient temperature 98.9 F    Collection site RADIAL, ALLEN'S TEST ACCEPTABLE    Drawn by RT    Sample type ARTERIAL   Glucose, capillary     Status: Abnormal   Collection Time: 05/15/21  7:37 AM  Result Value Ref Range   Glucose-Capillary 175 (H) 70 - 99 mg/dL    Comment: Glucose reference range applies only to samples taken after fasting for at least 8 hours.  CBC with Differential/Platelet     Status: Abnormal   Collection Time: 05/15/21  8:25 AM  Result Value Ref Range   WBC 6.0 4.0 - 10.5 K/uL   RBC 2.98 (L) 3.87 - 5.11 MIL/uL   Hemoglobin 9.0 (L) 12.0 - 15.0 g/dL   HCT 27.5 (L) 36.0 - 46.0 %   MCV 92.3 80.0 - 100.0 fL   MCH 30.2 26.0 - 34.0 pg   MCHC 32.7 30.0 - 36.0 g/dL   RDW 12.2 11.5 - 15.5 %   Platelets 176 150 - 400 K/uL   nRBC 0.0 0.0 - 0.2 %   Neutrophils Relative % 80 %   Neutro Abs 4.8 1.7 - 7.7 K/uL   Lymphocytes Relative 9 %   Lymphs Abs 0.5 (L) 0.7 - 4.0 K/uL   Monocytes Relative 8 %   Monocytes Absolute 0.5 0.1 - 1.0 K/uL   Eosinophils Relative 1 %   Eosinophils Absolute 0.1 0.0 - 0.5 K/uL   Basophils Relative 1 %   Basophils Absolute 0.0 0.0 - 0.1 K/uL   Immature Granulocytes 1 %   Abs Immature Granulocytes 0.03 0.00 - 0.07 K/uL    Comment: Performed at [facility], [address], [place], [place] [address]  Basic metabolic panel     Status: Abnormal   Collection Time: 05/15/21  8:25 AM  Result Value Ref Range   Sodium 148 (H) 135 - 145 mmol/L   Potassium 4.1 3.5 - 5.1 mmol/L   Chloride 105 98 - 111 mmol/L   CO2 31 22 - 32 mmol/L   Glucose,  Bld 176 (H) 70 - 99 mg/dL    Comment: Glucose reference range applies only to samples taken after fasting for at least 8 hours.   BUN 26 (H) 8 - 23 mg/dL    Creatinine, Ser 1.55 (H) 0.44 - 1.00 mg/dL   Calcium 9.4 8.9 - 10.3 mg/dL   GFR, Estimated 34 (L) >60 mL/min    Comment: (NOTE) Calculated using the CKD-EPI Creatinine Equation (2021)    Anion gap 12 5 - 15    Comment: Performed at [facility] [address], [place], [place] [address]  Magnesium     Status: None   Collection Time: 05/15/21  8:25 AM  Result Value Ref Range   Magnesium 2.3 1.7 - 2.4 mg/dL    Comment: Performed at [facility] [address], [place], [place] [address]  Phosphorus     Status: None   Collection Time: 05/15/21  8:25 AM  Result Value Ref Range   Phosphorus 2.8 2.5 - 4.6 mg/dL    Comment: Performed at [facility] [address], [place], [place] [address]  Glucose, capillary     Status: Abnormal   Collection Time: 05/15/21 11:21 AM  Result Value Ref Range   Glucose-Capillary 134 (H) 70 - 99 mg/dL    Comment: Glucose reference range applies only to samples taken after fasting for at least 8 hours.    DG Tibia/Fibula Right  Result Date: 05/15/2021 CLINICAL DATA:  Fracture. EXAM: RIGHT TIBIA AND FIBULA - 2 VIEW COMPARISON:  Right tibia/fibula radiographs 05/12/2021 FINDINGS: Spiral fractures are again seen of the proximal shaft of the fibula and mid to distal shaft of the tibia. The tibia fracture demonstrates decreased, mild residual lateral displacement, however mild dorsal displacement and angulation have slightly increased. There remains slightly less than 1 full shaft width lateral displacement of the fibular fracture which has not significantly changed, however there is 1/2 shaft width anterior displacement of the fibular fracture with slight dorsal angulation which have slightly increased. The knee and ankle remain located. Soft tissue swelling is noted in the lower leg. IMPRESSION: Mildly displaced and angulated fractures of the tibia and fibula with mixed interval changes as above. Electronically Signed   By: Logan Bores M.D.    On: 05/15/2021 11:05   CT HEAD WO CONTRAST (5MM)  Result Date: 05/14/2021 CLINICAL DATA:  Neuro deficit, acute, stroke suspected EXAM: CT HEAD WITHOUT CONTRAST TECHNIQUE: Contiguous axial images were obtained from the base of the skull through the vertex without intravenous contrast. RADIATION DOSE REDUCTION: This exam was performed according to the departmental dose-optimization program which includes automated exposure control, adjustment of the mA and/or kV according to patient size and/or use of iterative reconstruction technique. COMPARISON:  CT head 05/12/2021 BRAIN: BRAIN Cerebral ventricle sizes are concordant with the degree of cerebral volume loss. Patchy and confluent areas of decreased attenuation are noted throughout the deep and periventricular white matter of the cerebral hemispheres bilaterally, compatible with chronic microvascular ischemic disease. No evidence of large-territorial acute infarction. No parenchymal hemorrhage. No mass lesion. No extra-axial collection. No mass effect or midline shift. No hydrocephalus. Basilar cisterns are patent. Vascular: No hyperdense vessel. Atherosclerotic calcifications are present within the cavernous internal carotid arteries. Skull: No acute fracture or focal lesion. Sinuses/Orbits: Paranasal sinuses and mastoid air cells are clear. Bilateral lens replacement. Otherwise the orbits are unremarkable. Other: None. IMPRESSION: No acute intracranial abnormality. Electronically Signed   By: Clelia Croft.D.  On: 05/14/2021 18:56   MR BRAIN WO CONTRAST  Result Date: 05/15/2021 CLINICAL DATA:  Neuro deficit, acute, stroke suspected. EXAM: MRI HEAD WITHOUT CONTRAST TECHNIQUE: Multiplanar, multiecho pulse sequences of the brain and surrounding structures were obtained without intravenous contrast. COMPARISON:  Head CT yesterday.  MRI 04/01/2019 and 06/17/2018. FINDINGS: Brain: The study suffers from motion degradation. There is a punctate acute infarction  within the left frontal lobe deep white matter. No other acute MR finding. Chronic small-vessel ischemic changes affect the pons. No cerebellar abnormality. Cerebral hemispheres show generalized atrophy with chronic small-vessel ischemic changes of the white matter. No large vessel territory infarction. No mass lesion, hemorrhage, hydrocephalus or extra-axial collection. Vascular: Major vessels at the base of the brain show flow. Skull and upper cervical spine: Negative Sinuses/Orbits: Clear/normal Other: None IMPRESSION: Brain atrophy. Chronic small-vessel ischemic changes as noted above. Acute punctate white matter infarction in the left frontal lobe. No hemorrhage or swelling. Electronically Signed   By: Nelson Chimes M.D.   On: 05/15/2021 10:24   DG Abd Portable 1V  Result Date: 05/14/2021 CLINICAL DATA:  Abdominal distension EXAM: PORTABLE ABDOMEN - 1 VIEW COMPARISON:  January 2023 FINDINGS: Air and stool filled nondilated loops of bowel. Air and stool is visualized in the rectum. Status post stent graft placement of the aorto biiliacs. Atherosclerotic calcifications. Small LEFT pleural effusion. Bibasilar heterogeneous opacities likely reflecting atelectasis. Surgical clips project over the pelvis. Degenerative changes of the lower lumbar spine. IMPRESSION: Nonobstructive bowel gas pattern. Electronically Signed   By: Valentino Saxon M.D.   On: 05/14/2021 15:13   ECHOCARDIOGRAM COMPLETE  Result Date: 05/14/2021    ECHOCARDIOGRAM REPORT   Patient Name:   Caitlin Oliver Date of Exam: 05/14/2021 Medical Rec #:  [id number]      Height:       64.0 in Accession #:    [id number]     Weight:       174.8 lb Date of Birth:  April 03, 1941      BSA:          1.848 m Patient Age:    44 years       BP:           134/69 mmHg Patient Gender: F              HR:           91 bpm. Exam Location:  Inpatient Procedure: 2D Echo, Cardiac Doppler and Color Doppler Indications:    [place] respiratory distress R06.03  History:         Patient has prior history of Echocardiogram examinations, most                 recent 11/14/2018. CHF, COPD and Stroke; Risk Factors:Diabetes,                 Hypertension and Current Smoker.  Sonographer:    Bernadene Person RDCS Referring Phys: [id number] Cook  1. Left ventricular ejection fraction, by estimation, is 65 to 70%. The left ventricle has normal function. The left ventricle has no regional wall motion abnormalities. Left ventricular diastolic parameters are consistent with Grade I diastolic dysfunction (impaired relaxation).  2. Right ventricular systolic function is mildly reduced. The right ventricular size is not well visualized. There is severely elevated pulmonary artery systolic pressure. The estimated right ventricular systolic pressure is 09.3 mmHg.  3. The pericardial effusion is anterior to the right ventricle. Moderate pleural effusion in the left  lateral region.  4. The mitral valve is normal in structure. No evidence of mitral valve regurgitation. No evidence of mitral stenosis.  5. The aortic valve is normal in structure. Aortic valve regurgitation is not visualized. No aortic stenosis is present.  6. There is borderline dilatation of the ascending aorta, measuring 39 mm.  7. The inferior vena cava is normal in size with greater than 50% respiratory variability, suggesting right atrial pressure of 3 mmHg. FINDINGS  Left Ventricle: Left ventricular ejection fraction, by estimation, is 65 to 70%. The left ventricle has normal function. The left ventricle has no regional wall motion abnormalities. The left ventricular internal cavity size was normal in size. There is  no left ventricular hypertrophy. Left ventricular diastolic parameters are consistent with Grade I diastolic dysfunction (impaired relaxation). Right Ventricle: The right ventricular size is not well visualized. No increase in right ventricular wall thickness. Right ventricular systolic function is mildly  reduced. There is severely elevated pulmonary artery systolic pressure. The tricuspid regurgitant velocity is 4.19 m/s, and with an assumed right atrial pressure of 8 mmHg, the estimated right ventricular systolic pressure is 93.9 mmHg. Left Atrium: Left atrial size was normal in size. Right Atrium: Right atrial size was not well visualized. Pericardium: Trivial pericardial effusion is present. The pericardial effusion is anterior to the right ventricle. Mitral Valve: The mitral valve is normal in structure. No evidence of mitral valve regurgitation. No evidence of mitral valve stenosis. Tricuspid Valve: The tricuspid valve is normal in structure. Tricuspid valve regurgitation is mild . No evidence of tricuspid stenosis. Aortic Valve: The aortic valve is normal in structure. Aortic valve regurgitation is not visualized. No aortic stenosis is present. Pulmonic Valve: The pulmonic valve was normal in structure. Pulmonic valve regurgitation is trivial. No evidence of pulmonic stenosis. Aorta: The aortic root is normal in size and structure. There is borderline dilatation of the ascending aorta, measuring 39 mm. Venous: The inferior vena cava is normal in size with greater than 50% respiratory variability, suggesting right atrial pressure of 3 mmHg. IAS/Shunts: No atrial level shunt detected by color flow Doppler. Additional Comments: There is a moderate pleural effusion in the left lateral region.  LEFT VENTRICLE PLAX 2D LVIDd:         4.50 cm   Diastology LVIDs:         2.90 cm   LV e' medial:    3.69 cm/s LV PW:         1.00 cm   LV E/e' medial:  13.1 LV IVS:        1.10 cm   LV e' lateral:   7.62 cm/s LVOT diam:     2.20 cm   LV E/e' lateral: 6.4 LV SV:         64 LV SV Index:   35 LVOT Area:     3.80 cm  RIGHT VENTRICLE RV S prime:     8.92 cm/s TAPSE (M-mode): 1.3 cm LEFT ATRIUM             Index        RIGHT ATRIUM           Index LA diam:        3.80 cm 2.06 cm/m   RA Area:     11.40 cm LA Vol (A2C):   28.5  ml 15.42 ml/m  RA Volume:   22.30 ml  12.07 ml/m LA Vol (A4C):   32.7 ml 17.70 ml/m LA Biplane Vol: 30.7  ml 16.62 ml/m  AORTIC VALVE LVOT Vmax:   92.10 cm/s LVOT Vmean:  59.600 cm/s LVOT VTI:    0.169 m  AORTA Ao Root diam: 3.50 cm Ao Asc diam:  3.90 cm MITRAL VALVE               TRICUSPID VALVE MV Area (PHT): 4.36 cm    TR Peak grad:   70.2 mmHg MV Decel Time: 174 msec    TR Vmax:        419.00 cm/s MV E velocity: 48.40 cm/s MV A velocity: 87.40 cm/s  SHUNTS MV E/A ratio:  0.55        Systemic VTI:  0.17 m                            Systemic Diam: 2.20 cm Candee Furbish MD Electronically signed by Candee Furbish MD Signature Date/Time: 05/14/2021/11:36:55 AM    Final     Review of Systems  Unable to perform ROS: Mental status change  Blood pressure (!) 149/80, pulse 92, temperature 98.1 F (36.7 C), temperature source Oral, resp. rate 17, height _0  (1.626 m), weight 79.3 kg, SpO2 100 %. Physical Exam Constitutional:      General: She is not in acute distress.    Appearance: She is well-developed. She is not diaphoretic.  HENT:     Head: Normocephalic and atraumatic.  Eyes:     General: No scleral icterus.       Right eye: No discharge.        Left eye: No discharge.     Conjunctiva/sclera: Conjunctivae normal.  Cardiovascular:     Rate and Rhythm: Normal rate and regular rhythm.  Pulmonary:     Effort: Pulmonary effort is normal. No respiratory distress.  Musculoskeletal:     Cervical back: Normal range of motion.     Comments: RLE No traumatic wounds, ecchymosis, or rash  Splint in place  Sens DPN, SPN, TN intact  Motor EHL 5/5  Toes perfused, No significant edema  Skin:    General: Skin is warm and dry.  Neurological:     Mental Status: She is alert.  Psychiatric:        Mood and Affect: Mood normal.        Behavior: Behavior normal.    Assessment/Plan: Right tib/fib fx -- Could approach this surgically or non-operatively. Surgical fixation would allow her to WBAT on right  leg therefore increasing mobility. However, her Plavix use precludes spinal anesthesia so she would need to tolerate general anesthesia for 1-2h. Non-operative treatment would necessitate NWB for at least 4 weeks and continuous splint/cast. Orthopedic surgery will await recommendations from primary medical teams; decision will need to be made by middle of next week.    Lisette Abu, PA-C Orthopedic Surgery [phone] 05/15/2021, 11:54 AM

## 2021-05-15 NOTE — Assessment & Plan Note (Signed)
Suspect multifactorial, including chronic benzodiazepine use.  Patient continues to have agitated outbursts despite Precedex. MRI show chronic microvascular changes but no stroke.   Plan:  - Continue Precedex.  - PRN haloperidol - PRN lorazepam

## 2021-05-15 NOTE — Assessment & Plan Note (Signed)
Now off BiPAP  Plan: - BiPAP as needed - DNR/DNI

## 2021-05-15 NOTE — Progress Notes (Signed)
Critical care attending attestation note:  Patient seen and examined and relevant ancillary tests reviewed.  I agree with the assessment and plan of care as outlined by Caitlin Oliver, PAC.   Synopsis of assessment and plan:  Acute on chronic respiratory failure with hypoxia and hypercapnia (HCC) Now off BiPAP  Plan: - BiPAP as needed - DNR/DNI  Tibia/fibula fracture Due to fall at home.  Chronic Plavix use precludes spinal anesthesia.  Plan: - Given precarious medical condition, may be best to manage fracture medically as would require general anesthesia.   Delirium Suspect multifactorial, including chronic benzodiazepine use.  Patient continues to have agitated outbursts despite Precedex. MRI show chronic microvascular changes but no stroke.   Plan:  - Continue Precedex.  - PRN haloperidol - PRN lorazepam  COPD No wheezing at present. Quit smoking.  Severe pulmonary hypertension by echo now and in 2020, but normal RHC in 2021.  Plan: - suspect pulmonary hypertension due to HFpEF (pulmonary venous hypertension) - trial of gentle diuresis     CRITICAL CARE Performed by: Kipp Brood   Total critical care time: 35 minutes  Critical care time was exclusive of separately billable procedures and treating other patients.  Critical care was necessary to treat or prevent imminent or life-threatening deterioration.  Critical care was time spent personally by me on the following activities: development of treatment plan with patient and/or surrogate as well as nursing, discussions with consultants, evaluation of patient's response to treatment, examination of patient, obtaining history from patient or surrogate, ordering and performing treatments and interventions, ordering and review of laboratory studies, ordering and review of radiographic studies, pulse oximetry, re-evaluation of patient's condition and participation in multidisciplinary rounds.  Kipp Brood, MD [facility] ICU  Physician [place]  Pager: [phone] Mobile: [phone] After hours: [phone].  05/15/2021, 2:44 PM

## 2021-05-15 NOTE — TOC CAGE-AID Note (Signed)
Transition of Care [facility]) - CAGE-AID Screening   Patient Details  Name: Caitlin Oliver MRN: [id number] Date of Birth: Dec 06, 1940  Transition of [facility]) CM/SW Contact:    Ella Guillotte C [name], [place] Phone Number: 05/15/2021, 1:07 PM   Clinical Narrative: Pt participated in [place].  Pt stated she does not use substance or ETOH.  Pt was not offered resources, due to no usage of substance or ETOH.    Pt needed redirecting during assessment, due to confusion.  [name] [name], MSW, LCSW-A Pronouns:  She/Her/Hers [place] Transitions of Care Clinical Social Worker Direct Number:  [phone] [name]   CAGE-AID Screening:    Have You Ever Felt You Ought to Cut Down on Your Drinking or Drug Use?: No Have People Annoyed You By [employer] Your Drinking Or Drug Use?: No Have You Felt Bad Or Guilty About Your Drinking Or Drug Use?: No Have You Ever Had a Drink or Used Drugs First Thing In The Morning to Steady Your Nerves or to Get Rid of a Hangover?: No CAGE-AID Score: 0  Substance Abuse Education Offered: No

## 2021-05-16 ENCOUNTER — Ambulatory Visit: Payer: PPO | Admitting: Internal Medicine

## 2021-05-16 ENCOUNTER — Inpatient Hospital Stay (HOSPITAL_COMMUNITY): Payer: PPO

## 2021-05-16 DIAGNOSIS — R410 Disorientation, unspecified: Secondary | ICD-10-CM | POA: Diagnosis not present

## 2021-05-16 DIAGNOSIS — S82201A Unspecified fracture of shaft of right tibia, initial encounter for closed fracture: Secondary | ICD-10-CM | POA: Diagnosis not present

## 2021-05-16 DIAGNOSIS — S82401A Unspecified fracture of shaft of right fibula, initial encounter for closed fracture: Secondary | ICD-10-CM | POA: Diagnosis not present

## 2021-05-16 LAB — CBC
HCT: 27.7 % — ABNORMAL LOW (ref 36.0–46.0)
Hemoglobin: 8.8 g/dL — ABNORMAL LOW (ref 12.0–15.0)
MCH: 29.8 pg (ref 26.0–34.0)
MCHC: 31.8 g/dL (ref 30.0–36.0)
MCV: 93.9 fL (ref 80.0–100.0)
Platelets: 198 10*3/uL (ref 150–400)
RBC: 2.95 MIL/uL — ABNORMAL LOW (ref 3.87–5.11)
RDW: 12.4 % (ref 11.5–15.5)
WBC: 7.9 10*3/uL (ref 4.0–10.5)
nRBC: 0 % (ref 0.0–0.2)

## 2021-05-16 LAB — BASIC METABOLIC PANEL
Anion gap: 11 (ref 5–15)
BUN: 26 mg/dL — ABNORMAL HIGH (ref 8–23)
CO2: 32 mmol/L (ref 22–32)
Calcium: 9.1 mg/dL (ref 8.9–10.3)
Chloride: 102 mmol/L (ref 98–111)
Creatinine, Ser: 1.46 mg/dL — ABNORMAL HIGH (ref 0.44–1.00)
GFR, Estimated: 36 mL/min — ABNORMAL LOW (ref >60)
Glucose, Bld: 125 mg/dL — ABNORMAL HIGH (ref 70–99)
Potassium: 3.5 mmol/L (ref 3.5–5.1)
Sodium: 145 mmol/L (ref 135–145)

## 2021-05-16 LAB — BRAIN NATRIURETIC PEPTIDE: B Natriuretic Peptide: 383.1 pg/mL — ABNORMAL HIGH (ref 0.0–100.0)

## 2021-05-16 LAB — PROCALCITONIN: Procalcitonin: <0.1 ng/mL

## 2021-05-16 LAB — GLUCOSE, CAPILLARY
Glucose-Capillary: 129 mg/dL — ABNORMAL HIGH (ref 70–99)
Glucose-Capillary: 151 mg/dL — ABNORMAL HIGH (ref 70–99)
Glucose-Capillary: 170 mg/dL — ABNORMAL HIGH (ref 70–99)
Glucose-Capillary: 124 mg/dL — ABNORMAL HIGH (ref 70–99)
Glucose-Capillary: 178 mg/dL — ABNORMAL HIGH (ref 70–99)
Glucose-Capillary: 144 mg/dL — ABNORMAL HIGH (ref 70–99)

## 2021-05-16 LAB — PHOSPHORUS: Phosphorus: 3 mg/dL (ref 2.5–4.6)

## 2021-05-16 LAB — MAGNESIUM: Magnesium: 2.1 mg/dL (ref 1.7–2.4)

## 2021-05-16 MED ORDER — PRAVASTATIN SODIUM 40 MG PO TABS
20.0000 mg | ORAL_TABLET | Freq: Every day | ORAL | Status: DC
  Administered 2021-05-16: 20 mg via ORAL
  Filled 2021-05-16: qty 1

## 2021-05-16 MED ORDER — BIOTIN 1000 MCG PO TABS: 1000.0000 ug | ORAL_TABLET | Freq: Every day | ORAL | Status: DC

## 2021-05-16 MED ORDER — METOPROLOL TARTRATE 50 MG PO TABS
50.0000 mg | ORAL_TABLET | Freq: Two times a day (BID) | ORAL | Status: DC
  Administered 2021-05-16 – 2021-05-20 (×7): 50 mg via ORAL
  Filled 2021-05-16 (×8): qty 1

## 2021-05-16 MED ORDER — PANTOPRAZOLE SODIUM 40 MG PO TBEC
40.0000 mg | DELAYED_RELEASE_TABLET | Freq: Two times a day (BID) | ORAL | Status: DC
  Administered 2021-05-16 – 2021-05-27 (×22): 40 mg via ORAL
  Filled 2021-05-16 (×22): qty 1

## 2021-05-16 MED ORDER — FLUOXETINE HCL 20 MG PO CAPS
20.0000 mg | ORAL_CAPSULE | Freq: Every day | ORAL | Status: DC
  Administered 2021-05-16 – 2021-05-27 (×11): 20 mg via ORAL
  Filled 2021-05-16 (×11): qty 1

## 2021-05-16 MED ORDER — POTASSIUM CHLORIDE CRYS ER 20 MEQ PO TBCR
40.0000 meq | EXTENDED_RELEASE_TABLET | Freq: Once | ORAL | Status: AC
  Administered 2021-05-16: 40 meq via ORAL
  Filled 2021-05-16: qty 2

## 2021-05-16 MED ORDER — DOCUSATE SODIUM 50 MG/5ML PO LIQD
100.0000 mg | Freq: Every day | ORAL | Status: DC
  Administered 2021-05-16 – 2021-05-27 (×9): 100 mg via ORAL
  Filled 2021-05-16 (×9): qty 10

## 2021-05-16 NOTE — Progress Notes (Signed)
[facility] ADULT ICU REPLACEMENT PROTOCOL   The patient does apply for the [facility] Adult ICU Electrolyte Replacment Protocol based on the criteria listed below:   1.Exclusion criteria: TCTS patients, ECMO patients, and Dialysis patients 2. Is GFR >/= 30 ml/min? Yes.    Patient's GFR today is 36 3. Is SCr </= 2? Yes.   Patient's SCr is 1.46 mg/dL 4. Did SCr increase >/= 0.5 in 24 hours? No. 5.Pt's weight >40kg  Yes.   6. Abnormal electrolyte(s): K+ 3.5  7. Electrolytes replaced per protocol 8.  Call MD STAT for K+ </= 2.5, Phos </= 1, or Mag </= 1 Physician:  n/a  Caitlin Oliver 05/16/2021 4:05 AM

## 2021-05-16 NOTE — Progress Notes (Signed)
NAME:  Caitlin Oliver, MRN:  [id number], DOB:  [birth year], LOS: 4 ADMISSION DATE:  05/12/2021, CONSULTATION DATE: 05/13/2021 REFERRING MD: Dr. Mila Homer, CHIEF COMPLAINT: Hypotension  History of Present Illness:  81 year old female with multiple medical problems significant for type 2 diabetes, carotid artery stenosis status post bilateral endarterectomies, extensive PAD, pulmonary hypertension, and COPD.  She wears 3 L of oxygen at chronically.  She lives at home alone and uses a walker.  She gets winded easily after ambulating for short period of time.  She presented most emergency department on 1/27 with complaints of fall at home.  She fell while in the bathroom states she got dizzy but also her leg gave out.  She was unable to get off the floor due to extreme pain in the right lower extremity.  She was able to make to the phone and called EMS.  Of note she did fall 1 week prior after experiencing dizziness for which she presented to the emergency department suffering a head laceration which was stapled.  Also of note she is on aspirin and Plavix.  Initially in the emergency department she was hypertensive and tachypneic.  She also had deformity of the right lower extremity with bruising over the lateral aspect.  Imaging of the right lower extremity demonstrated severe tib-fib fracture and high risk for compartment syndrome.  She is admitted to the hospital for surgical management.  Orthopedics was consulted and she was scheduled for surgery on 1/28, however during the preop assessment she was noted to be hypotensive and lethargic.  Hypotension did not improve with IV fluids and norepinephrine was started.  Surgery was aborted and PCCM was consulted for ICU transfer.  Pertinent  Medical History   Past Medical History:  Diagnosis Date   Anxiety    Arthritis    Cancer ([place])    lung cancer   Carotid artery disease (HCC)    Cataracts, bilateral    CHF (congestive heart failure) (HCC)     Claudication (HCC)    COPD (chronic obstructive pulmonary disease) ([place])    Diabetes mellitus    Dizzy    Family history of adverse reaction to anesthesia    Pt nephew has PONV   GERD (gastroesophageal reflux disease)    Headache    History of kidney stones    Hypercholesteremia    Hypertension    Peripheral arterial disease (HCC)    bilateral iliac artery stenosis by angiography   Pneumonia    Stomach ulcer    Stroke [facility])    Tobacco abuse    Wears glasses    Significant Hospital Events: Including procedures, antibiotic start and stop dates in addition to other pertinent events   1/27 admit for R tib/fib closed fx 1/27 head CT negative CT C-spine -degenerative disease C5/6 1/28 fever, tachycardic, hypotensive, lethargic, surgery cancelled re shock 1/30 Required BiPAP overnight, due to mask incompatibility initially unable to obtain MRI.  CT Head NAICA. MRI with atrophy, chronic small vessel ischemic changes, acute punctate white matter infarct of L frontal lobe. Acutely agitated and tachycardic to 170s in early afternoon, improved with Precedex/Ativan/Haldol. XR tib/fib mildly displaced/angulated fx.  Interim History / Subjective:   Amiodarone infusion HR 90's Chest x-ray 1/31 reviewed shows bibasilar atelectasis, left basilar volume loss, question effusion   Objective   Blood pressure (!) 155/66, pulse 96, temperature 98 F (36.7 C), temperature source Oral, resp. rate 14, height _0  (1.626 m), weight 79.3 kg, SpO2 100 %.  Intake/Output Summary (Last 24 hours) at 05/16/2021 0809 Last data filed at 05/16/2021 0700 Gross per 24 hour  Intake 1014 ml  Output 1850 ml  Net -836 ml   Filed Weights   05/12/21 2053  Weight: 79.3 kg   Physical Examination: General: Elderly woman, comfortable laying in bed HEENT: No stridor.  Some face asymmetry, mild droop Neuro: Awake and interacting.  Requires some reorientation but overall oriented to situation, place, self.   Some confusion about date.  Moves all extremities.  Strong cough. CV: Regular, 90, sinus PULM: Clear bilaterally.  Comfortable respiratory pattern GI: Nondistended, positive bowel sounds Extremities: No edema.  Right lower extremity is immobilized, wrapped Skin: Warm, no ras  Resolved Hospital Problem List:    Assessment & Plan:   Acute metabolic encephalopathy Likely multifactorial in the setting of long-term benzo use.  CT head NAICA. MRI 1/30 with atrophy, chronic small vessel ischemic changes and acute punctate infarct of left frontal lobe, no significant stroke. -Precedex off -haldol and lorazepam are available prn, plan to stop the lorazepam and keep Haldol available  Undifferentiated shock with preoperative instability in the setting of tachycardia, shock Pulmonary hypertension SVT Etiology unclear. She had two episodes of syncope recently,?  Pulmonary hypertension contributing. Febrile 1/28 to Tmax 102F. WBC 12.4, raises question of septic shock but no apparent source; query left lower lobe pneumonia. Echo 1/29 with EF 65 to 45%, grade 1 diastolic dysfunction, severely elevated pulmonary artery systolic pressure, RVSP 40.9 mmHg. -WBC to 7.9, PCT to < 0.10 -Remains on empiric ceftriaxone, no clear infectious source.  Plan duration 5 days -SVT resolved, plan to discontinue amiodarone  Closed fracture of R tib-fib, present on admission Repeat tib/fib XR demonstrating mildly displaced and angulated fractures. -Surgical repair held pending medical improvement, resolution shock. -Plavix on hold, hopefully would be able to proceed with OR with OrthoTrauma this week -Spinal anesthesia would be preferred if feasible given her history of PAH   Chronic hypoxic respiratory failure Multifactorial secondary PAH Chronic HFpEF. Echo as above demonstrating severely elevated pulmonary artery pressure, RVSP 78.2 mmHg. -Has weaned to nasal cannula O2, did not require BiPAP overnight.   Baseline 3 L/min -Push pulmonary hygiene -Ceftriaxone as ordered, plan complete 5 days total -Follow intermittent chest x-ray -Resume home diuretics when hemodynamically stabilized (torsemide, metolazone), as S Cr improves   CKD stage IIIa -follow BMP off diuretics temporarily -replace electrolytes as indicated -Avoid nephrotoxins and ensure adequate renal perfusion   DM2 -CBG monitoring, SSI  Best Practice (right click and "Reselect all SmartList Selections" daily)   Diet/type: Regular consistency (see orders) DVT prophylaxis: prophylactic heparin  GI prophylaxis: PPI Lines: N/A Foley:  N/A or Code Status:  DNR Last date of multidisciplinary goals of care discussion [1/30 - Patient remains DNR ]  Should be able to move out of the ICU 05/16/2021.  We will ask FPTS to assume her care as of 05/17/2021.  Critical care time: NA    Baltazar Apo, MD, PhD 05/16/2021, 8:10 AM [facility] [phone] or if no answer before 7:00PM call [phone] For any issues after 7:00PM please call eLink [phone]

## 2021-05-16 NOTE — Hospital Course (Addendum)
Caitlin Oliver is a 81 y.o. female presenting after an unwitnessed fall at home. PMH is significant for CVA, CHF, T2DM, HTN, HLD, pulmonary HTN, and COPD on 3L at baseline.  Right tibia and fibula closed fracture 2/2 fall from mechanical fall In the ED she was hypertensive and tachypneic. Physical exam demonstrated swollen R lower extremity with intermittent bruising and mild erythema that was severely painful to palpation but not passive foot flexion; no evidence of compartment syndrome appreciated. Labs were notable for normal WBC and Hgb as well as flat troponins. ECG was negative for ACS. Tib/fib XR was positive for displaced fractures in proximal R fibula and distal R tibia. CXR demonstrated small left pleural effusion, and CT head showed no acute intracranial abnormality. Orthopedics was consulted and she was scheduled for surgery on 1/28, however during the preop assessment she was noted to be hypotensive and lethargic.  Hypotension did not improve with IV fluids and norepinephrine was started. Surgery was aborted and PCCM was consulted for ICU transfer. She transitioned back to the family medicine service on 2/1. She received surgery on 2/7 with an intramedullary nail without complications. Pain was controlled and patient was recommended rehab by PT for strengthening. She was discharged in stable, post-surgical condition on 2/11.   Acute Metabolic Encephalopathy vs Delirium, multifactorial  Acute Left Frontal CVA On 1/28 presented with shock like picture with fever to 102, hypotension that did not improve with fluid resuscitation and was started on vasopressors.  She was at this time unable to respond well and lethargic.  Surgery was aborted and was transferred to ICU.  Was started on ceftriaxone and vancomycin empirically for possible septic shock.  There was also concern for encephalopathy in the setting of long-term benzo use. CT head showed no acute abnormalities. She was subsequently found to  have an acute white matter infarct of her left frontal lobe on MRI. She had waxing and waning mental status.  ABGs had shown some hypercarbia and BiPAP was initiated. [name] was alert and oriented x4 and mentating well.  She was discharged on Aspirin and Plavix DAPT until 2/20, after which time she should transition to Plavix monotherapy.   Undifferentiated Shock On 1/28 presented with shock like picture with fever to 102, hypotension that did not improve with fluid resuscitation and was started on vasopressors. Etiology was unclear.  She does have severe pulmonary hypertension which could have contributed as well.  On 1/28 had Tmax of 102 and WBC of 12.4 which raised the question of septic shock but there was no apparent source. Possible lower lobe pneumonia although chest x-ray was not convincing.  Was started on empiric ceftriaxone, de-escalated after blood cultures and urine cultures grew no organisms. Vasopressors were weaned and patient was transferred to [facility] after shock resolved.     Chronic Hypoxic Respiratory Failure +  Pulmonary Artery Hypertension Echo 1/29 with EF 65 to 51%, grade 1 diastolic dysfunction, severely elevated pulmonary artery systolic pressure, RVSP 88.4 mmHg.  Patient remained on her home 3 L of oxygen.  She was put back on her home torsemide at half the dose and to restart up full dose on day after discharge.  Anemia Likely 2/2 to chronic disease. Stayed mostly in the 8's during hospitalization. No pRBC required.   Chronic/stable Chronic HFpEF with diastolic dysfunction and EF 65-70%  Constipation  T2DM CKD 3a Severe malnutrition in context of chronic illness- RD assessed   Issues for Follow Up:  CBC and  outpatient evaluation for anemia Would benefit from continuing BiPAP overnight, suspected sleep apnea but has refused sleep studies in the past Ensure Ortho follow up for R Tib-Fib Fracture  Restarted patient on home torsemide 40 mg BID,  monitor fluid status and repeat BMP to monitor Cr/K Orthopedics recommended continuing aspirin for DVT prophylaxis for 4 weeks after surgery (around 3/7) Need a DEXA scan as an outpatient in addition to likely bisphosphonate therapy.  Vitamin D therapy is on board. Was on 50 mg BID metoprolol-decreased to 12.5 mg  Re-consider metformin therapy as patient is CKD 3a Discontinued sedating medications of valium, trazadone, librax, bentyl and gabapentin at discharge as patient was not receiving them in the hospital and could have contributed to her fall.

## 2021-05-16 NOTE — Progress Notes (Signed)
FPTS conversation with ICU  Spoke with [facility] about patient returning from ICU tomorrow morning. Patient is doing better and went to ICU for hypotension/SVT, was started and finished an amiodarone drip. There was a concern for sepsis from a LLL, and she was started on CFTX for a five day course, do to end 05/18/21. Patient also had some confusion but is alert and oriented today, with as needed haldol and ativan. Ortho is following and has not re-scheduled her surgery yet.   FPTS will pick up the patient tomorrow, 05/17/21 at 7 am.   [place] PGY-1

## 2021-05-17 DIAGNOSIS — S82401A Unspecified fracture of shaft of right fibula, initial encounter for closed fracture: Secondary | ICD-10-CM | POA: Diagnosis not present

## 2021-05-17 DIAGNOSIS — S82201A Unspecified fracture of shaft of right tibia, initial encounter for closed fracture: Secondary | ICD-10-CM | POA: Diagnosis not present

## 2021-05-17 DIAGNOSIS — I639 Cerebral infarction, unspecified: Secondary | ICD-10-CM | POA: Diagnosis not present

## 2021-05-17 LAB — BASIC METABOLIC PANEL
Anion gap: 7 (ref 5–15)
BUN: 21 mg/dL (ref 8–23)
CO2: 31 mmol/L (ref 22–32)
Calcium: 9.2 mg/dL (ref 8.9–10.3)
Chloride: 103 mmol/L (ref 98–111)
Creatinine, Ser: 1.36 mg/dL — ABNORMAL HIGH (ref 0.44–1.00)
GFR, Estimated: 39 mL/min — ABNORMAL LOW (ref >60)
Glucose, Bld: 167 mg/dL — ABNORMAL HIGH (ref 70–99)
Potassium: 4.5 mmol/L (ref 3.5–5.1)
Sodium: 141 mmol/L (ref 135–145)

## 2021-05-17 LAB — HEMOGLOBIN A1C
Hgb A1c MFr Bld: 6.6 % — ABNORMAL HIGH (ref 4.8–5.6)
Mean Plasma Glucose: 142.72 mg/dL

## 2021-05-17 LAB — GLUCOSE, CAPILLARY
Glucose-Capillary: 133 mg/dL — ABNORMAL HIGH (ref 70–99)
Glucose-Capillary: 134 mg/dL — ABNORMAL HIGH (ref 70–99)
Glucose-Capillary: 139 mg/dL — ABNORMAL HIGH (ref 70–99)
Glucose-Capillary: 162 mg/dL — ABNORMAL HIGH (ref 70–99)
Glucose-Capillary: 173 mg/dL — ABNORMAL HIGH (ref 70–99)
Glucose-Capillary: 191 mg/dL — ABNORMAL HIGH (ref 70–99)

## 2021-05-17 LAB — CBC
HCT: 27.6 % — ABNORMAL LOW (ref 36.0–46.0)
Hemoglobin: 9.1 g/dL — ABNORMAL LOW (ref 12.0–15.0)
MCH: 31.3 pg (ref 26.0–34.0)
MCHC: 33 g/dL (ref 30.0–36.0)
MCV: 94.8 fL (ref 80.0–100.0)
Platelets: 187 10*3/uL (ref 150–400)
RBC: 2.91 MIL/uL — ABNORMAL LOW (ref 3.87–5.11)
RDW: 12.5 % (ref 11.5–15.5)
WBC: 8.5 10*3/uL (ref 4.0–10.5)
nRBC: 0 % (ref 0.0–0.2)

## 2021-05-17 LAB — LIPID PANEL
Cholesterol: 135 mg/dL (ref 0–200)
HDL: 31 mg/dL — ABNORMAL LOW (ref >40)
LDL Cholesterol: 67 mg/dL (ref 0–99)
Total CHOL/HDL Ratio: 4.4 RATIO
Triglycerides: 185 mg/dL — ABNORMAL HIGH (ref <150)
VLDL: 37 mg/dL (ref 0–40)

## 2021-05-17 LAB — PHOSPHORUS: Phosphorus: 3.9 mg/dL (ref 2.5–4.6)

## 2021-05-17 LAB — MAGNESIUM: Magnesium: 2.3 mg/dL (ref 1.7–2.4)

## 2021-05-17 MED ORDER — ACETAMINOPHEN 325 MG PO TABS: 650.0000 mg | ORAL_TABLET | Freq: Four times a day (QID) | ORAL | Status: DC | PRN

## 2021-05-17 MED ORDER — ATORVASTATIN CALCIUM 40 MG PO TABS
40.0000 mg | ORAL_TABLET | Freq: Every day | ORAL | Status: DC
  Administered 2021-05-17 – 2021-05-27 (×10): 40 mg via ORAL
  Filled 2021-05-17 (×10): qty 1

## 2021-05-17 MED ORDER — TORSEMIDE 20 MG PO TABS
40.0000 mg | ORAL_TABLET | Freq: Two times a day (BID) | ORAL | Status: DC
  Administered 2021-05-17 – 2021-05-19 (×4): 40 mg via ORAL
  Filled 2021-05-17 (×5): qty 2

## 2021-05-17 MED ORDER — METOLAZONE 5 MG PO TABS
5.0000 mg | ORAL_TABLET | Freq: Every day | ORAL | Status: DC | PRN
  Filled 2021-05-17: qty 1

## 2021-05-17 MED ORDER — OXYCODONE HCL 5 MG PO TABS
5.0000 mg | ORAL_TABLET | ORAL | Status: DC | PRN
  Administered 2021-05-17 (×3): 5 mg via ORAL
  Filled 2021-05-17 (×4): qty 1

## 2021-05-17 MED ORDER — ACETAMINOPHEN 325 MG PO TABS
650.0000 mg | ORAL_TABLET | Freq: Four times a day (QID) | ORAL | Status: DC
  Administered 2021-05-17 – 2021-05-27 (×37): 650 mg via ORAL
  Filled 2021-05-17 (×39): qty 2

## 2021-05-17 MED ORDER — POLYETHYLENE GLYCOL 3350 17 G PO PACK
17.0000 g | PACK | Freq: Every day | ORAL | Status: DC
  Administered 2021-05-17 – 2021-05-19 (×3): 17 g via ORAL
  Filled 2021-05-17 (×3): qty 1

## 2021-05-17 NOTE — Progress Notes (Signed)
Spoke to neurology for recommendations given MRI findings and acute encephalopathy during this admission. They will see the patient. Still attempting to reach ortho to determine timeframe of R tib/fib surgery. Appreciate all recommendations.

## 2021-05-17 NOTE — Evaluation (Signed)
Speech Language Pathology Evaluation Patient Details Name: Caitlin Oliver MRN: [id number] DOB: 1940/04/21 Today's Date: 05/17/2021 Time: [id number] SLP Time Calculation (min) (ACUTE ONLY): 25 min  Problem List:  Patient Active Problem List   Diagnosis Date Noted   Delirium 05/15/2021   Acute on chronic respiratory failure with hypoxia and hypercapnia (HCC) 05/15/2021   Tibia/fibula fracture 05/12/2021   Acute on chronic heart failure with preserved ejection fraction (HFpEF) ([place])    Cancer of left lung ([place]) 02/03/2016   Solitary pulmonary nodule 12/08/2015   Subclavian steal syndrome 10/28/2015   COPD 10/18/2013   Hypoxia 10/10/2013   PVD (peripheral vascular disease) with claudication ([place]) 07/29/2013   Occlusion and stenosis of carotid artery without mention of cerebral infarction 03/02/2013   Carotid artery obstruction 03/02/2013   Tobacco abuse 02/27/2013   Essential hypertension 01/20/2013   Type 2 diabetes mellitus ([place]) 01/20/2013   Hyperlipidemia 01/20/2013   Past Medical History:  Past Medical History:  Diagnosis Date   Anxiety    Arthritis    Cancer ([place])    lung cancer   Carotid artery disease ([place])    Cataracts, bilateral    CHF (congestive heart failure) ([place])    Claudication ([place])    COPD (chronic obstructive pulmonary disease) ([place])    Diabetes mellitus    Dizzy    Family history of adverse reaction to anesthesia    Pt nephew has PONV   GERD (gastroesophageal reflux disease)    Headache    History of kidney stones    Hypercholesteremia    Hypertension    Peripheral arterial disease ([place])    bilateral iliac artery stenosis by angiography   Pneumonia    Stomach ulcer    Stroke ([place])    Tobacco abuse    Wears glasses    Past Surgical History:  Past Surgical History:  Procedure Laterality Date   ABDOMINAL AORTIC ENDOVASCULAR STENT GRAFT Right 06/19/2019   Procedure: INNOMINATE STENT, RIGHT CAROTID STENT, RIGHT SUBCLAVIAN STENT AND CAROTID CUTDOWN;   Surgeon: Marty Heck, MD;  Location: [place];  Service: Vascular;  Laterality: Right;   ABDOMINAL HYSTERECTOMY     APPENDECTOMY     BREAST REDUCTION SURGERY     CAROTID ANGIOGRAM N/A 02/25/2013   Procedure: CAROTID ANGIOGRAM;  Surgeon: Lorretta Harp, MD;  Location[facility] C[facility] LAB;  Service: Cardiovascular;  Laterality: N/A;   ENDARTERECTOMY Right 03/06/2013   Procedure: ENDARTERECTOMY CAROTID-RIGHT;  Surgeon: Serafina Mitchell, MD;  Location: [place];  Service: Vascular;  Laterality: Right;   ENDARTERECTOMY Left 05/07/2013   Procedure: LEFT CAROTID ARTERY ENDARTERECTOMY WITH VASCU-GUARD PATCH ANGIOPLASTY ;  Surgeon: Serafina Mitchell, MD;  Location: [place];  Service: Vascular;  Laterality: Left;   INTRAOPERATIVE ARTERIOGRAM N/A 06/19/2019   Procedure: Clydie Braun;  Surgeon: Marty Heck, MD;  Location: [place];  Service: Vascular;  Laterality: N/A;   IR ANGIO INTRA EXTRACRAN SEL COM CAROTID INNOMINATE UNI R MOD SED  05/14/2019   IR ANGIO VERTEBRAL SEL SUBCLAVIAN INNOMINATE BILAT MOD SED  05/14/2019   LOBECTOMY Left 02/03/2016   Procedure: LEFT UPPER LOBECTOMY;  Surgeon: [name], MD;  Location: [place];  Service: Thoracic;  Laterality: Left;   LOWER EXTREMITY ANGIOGRAM N/A 02/25/2013   Procedure: LOWER EXTREMITY ANGIOGRAM;  Surgeon: Lorretta Harp, MD;  Location: [facility] CATH LAB;  Service: Cardiovascular;  Laterality: N/A;   LOWER EXTREMITY ANGIOGRAM N/A 07/27/2013   Procedure: LOWER EXTREMITY ANGIOGRAM;  Surgeon: Lorretta Harp, MD;  Location: [place] CATH LAB;  Service: Cardiovascular;  Laterality: N/A;   PATCH ANGIOPLASTY Right 03/06/2013   Procedure: PATCH ANGIOPLASTY of Right Carotid Artery using Vascu-Guard Patch;  Surgeon: Serafina Mitchell, MD;  Location: [place];  Service: Vascular;  Laterality: Right;   PERIPHERAL VASCULAR CATHETERIZATION N/A 11/15/2015   Procedure: Aortic Arch Angiography;  Surgeon: Serafina Mitchell, MD;  Location: [place] CV LAB;  Service: Cardiovascular;   Laterality: N/A;   PERIPHERAL VASCULAR CATHETERIZATION Bilateral 11/15/2015   Procedure: Carotid Angiography;  Surgeon: Serafina Mitchell, MD;  Location: [place] CV LAB;  Service: Cardiovascular;  Laterality: Bilateral;   PERIPHERAL VASCULAR CATHETERIZATION Left 11/15/2015   Procedure: Upper Extremity Angiography;  Surgeon: Serafina Mitchell, MD;  Location: [place] CV LAB;  Service: Cardiovascular;  Laterality: Left;   PERIPHERAL VASCULAR CATHETERIZATION Left 11/15/2015   Procedure: Peripheral Vascular Intervention;  Surgeon: Serafina Mitchell, MD;  Location: [place] CV LAB;  Service: Cardiovascular;  Laterality: Left;  subclavian    RIGHT HEART CATH N/A 06/16/2019   Procedure: RIGHT HEART CATH;  Surgeon: Jolaine Artist, MD;  Location: [place] CV LAB;  Service: Cardiovascular;  Laterality: N/A;   RIGHT/LEFT HEART CATH AND CORONARY ANGIOGRAPHY N/A 11/20/2018   Procedure: RIGHT/LEFT HEART CATH AND CORONARY ANGIOGRAPHY;  Surgeon: Nelva Bush, MD;  Location: [place] CV LAB;  Service: Cardiovascular;  Laterality: N/A;   SALIVARY GLAND SURGERY     scar tissue removed from left saliva glad   TUBAL LIGATION     ULTRASOUND GUIDANCE FOR VASCULAR ACCESS Right 06/19/2019   Procedure: Ultrasound Guidance For Vascular Access;  Surgeon: Marty Heck, MD;  Location: [place];  Service: Vascular;  Laterality: Right;   VIDEO ASSISTED THORACOSCOPY (VATS)/WEDGE RESECTION Left 02/03/2016   Procedure: VIDEO ASSISTED THORACOSCOPY;  Surgeon: [name], MD;  Location: [place];  Service: Thoracic;  Laterality: Left;   HPI:  81 year old female with multiple medical problems admitted 1/27 with severe tib/fib fxs of RLE; planned IM fixation but declined medically with lethargy,  hypercarbic respiratory failure; placed on BiPAP and surgery was cancelled.  Current dx include shock, acute left frontal CVA, acute hypoxic respiratory failure.  PMHx significant for type 2 diabetes, carotid artery stenosis  status post bilateral endarterectomies, extensive PAD, pulmonary hypertension, and COPD on 3 L of oxygen at home.  MRI with atrophy, chronic small vessel ischemic changes, acute punctate white matter infarct of L frontal lobe. Previous bedside swallow evaluation by SLP noted expressive aphasia with anomia, semantic and phonemic paraphasias, and awareness of deficits.   Assessment / Plan / Recommendation Clinical Impression  Pt participated in speech-language cognitive eval at bedside. SLP initially administered SLUMS and moved to [place] Test d/t pt. requiring max assist to understand and follow SLUMS eval. On the MS Aphasia Screen, pt displayed a mod-severe deficit in repetition and a moderate deficit in following commands. She scored 4/10 for repetition and 6/10 for following commands, which is below normal limits. Pt benefited from repetition and increased vocal intensity to improve accuracy. Noted that she has increased difficulty processing and recall new information; short term memory deficit. Throughout the session, pt demonstrated mod perseverations on words and phrases. Pt also required cues in order to switch and follow directions from other speakers in the room. Skilled SLP services are clinically indicated at this time to improve cognitive-linguistic function. Recommend targeting functional tasks for memory, processing speed, following commands, and repetition.    SLP Assessment  SLP Recommendation/Assessment:  Patient needs continued Speech Lanaguage Pathology Services SLP Visit Diagnosis: Cognitive communication deficit (R41.841);Aphasia (R47.01)    Recommendations for follow up therapy are one component of a multi-disciplinary discharge planning process, led by the attending physician.  Recommendations may be updated based on patient status, additional functional criteria and insurance authorization.    Follow Up Recommendations  Other (comment)    Assistance Recommended at  Discharge  Frequent or constant Supervision/Assistance  Functional Status Assessment Patient has had a recent decline in their functional status and demonstrates the ability to make significant improvements in function in a reasonable and predictable amount of time.  Frequency and Duration min 2x/week  2 weeks      SLP Evaluation Cognition  Overall Cognitive Status: Impaired/Different from baseline Arousal/Alertness: Awake/alert Orientation Level: Oriented to person;Oriented to place;Disoriented to time Year: 2023 Day of Week: Correct Attention: Selective Selective Attention: Appears intact Memory: Impaired Memory Impairment: Storage deficit;Retrieval deficit;Decreased recall of new information Awareness: Appears intact Problem Solving: Appears intact Behaviors: Perseveration Safety/Judgment: Appears intact       Comprehension  Auditory Comprehension Yes/No Questions: Within Functional Limits Conversation: Complex Interfering Components: Attention;Processing speed EffectiveTechniques: Extra processing time;Increased volume;Repetition Visual Recognition/Discrimination Discrimination: Not tested Reading Comprehension Reading Status: Not tested    Expression Expression Primary Mode of Expression: Verbal Verbal Expression Overall Verbal Expression: Impaired Initiation: No impairment Automatic Speech: Name;Social Response Level of Generative/Spontaneous Verbalization: Conversation Repetition: Impaired Level of Impairment: Phrase level Naming: No impairment Pragmatics: Unable to assess Interfering Components: Attention Effective Techniques: Semantic cues Non-Verbal Means of Communication: Not applicable Written Expression Written Expression: Not tested   Oral / Motor  Oral Motor/Sensory Function Overall Oral Motor/Sensory Function: Other (comment) Motor Speech Overall Motor Speech: Appears within functional limits for tasks assessed Respiration: Within functional  limits Phonation: Normal Resonance: Within functional limits Articulation: Within functional limitis Intelligibility: Intelligible Motor Planning: Witnin functional limits Motor Speech Errors: Not applicable            [employer] 05/17/2021, 1:02 PM

## 2021-05-17 NOTE — Consult Note (Signed)
Neurology Consultation  Reason for Consult: MRI brain with acute punctate white matter infarction in the left frontal lobe Referring Physician: Dr. Thompson Grayer  CC: Right lower extremity pain   History is obtained from: Chart review, patient   HPI: Caitlin Oliver is a 81 y.o. female with a medical history significant for CAD s/p bilateral CEAs on clopidogrel, hyperlipidemia, essential hypertension, extensive peripheral arterial disease, pulmonary hypertension, CHF, remote stroke, tobacco use, type 2 diabetes mellitus, CKD IV, and COPD on home O2 who presented to the ED on 1/27 after a fall at home, unclear if it was a mechanical fall versus syncope. Work up revealed Caitlin Oliver to have a closed fracture of the right tibia and fibula and was admitted for surgical intervention. On 1/28, Caitlin Oliver was noted to have an elevated temperature of 102 degrees with hypotension and there was some concern for possible sepsis and surgery was delayed for septic shock evaluation.  On 1/29, patient became encephalopathic with concern for possible acute stroke versus benzodiazepine withdrawal. An ABG revealed the patient had acute hypercarbic respiratory failure and was placed on BiPAP. An MRI brain was also obtained revealing a punctate infarction in the left frontal lobe on 1/30 and neurology was consulted for further recommendations.   At baseline, Caitlin Oliver lives alone at home and walks with a walker. She has had multiple recent presentations with recent falls. Of note, she had a fall on 1/20 causing a laceration to her posterior head s/p staple placement.   ROS: A complete ROS was performed and is negative except as noted in the HPI.   Past Medical History:  Diagnosis Date   Anxiety    Arthritis    Cancer ([place])    lung cancer   Carotid artery disease (HCC)    Cataracts, bilateral    CHF (congestive heart failure) (HCC)    Claudication (HCC)    COPD (chronic obstructive pulmonary disease) ([place])    Diabetes  mellitus    Dizzy    Family history of adverse reaction to anesthesia    Pt nephew has PONV   GERD (gastroesophageal reflux disease)    Headache    History of kidney stones    Hypercholesteremia    Hypertension    Peripheral arterial disease (HCC)    bilateral iliac artery stenosis by angiography   Pneumonia    Stomach ulcer    Stroke ([place])    Tobacco abuse    Wears glasses    Past Surgical History:  Procedure Laterality Date   ABDOMINAL AORTIC ENDOVASCULAR STENT GRAFT Right 06/19/2019   Procedure: INNOMINATE STENT, RIGHT CAROTID STENT, RIGHT SUBCLAVIAN STENT AND CAROTID CUTDOWN;  Surgeon: Marty Heck, MD;  Location: [place];  Service: Vascular;  Laterality: Right;   ABDOMINAL HYSTERECTOMY     APPENDECTOMY     BREAST REDUCTION SURGERY     CAROTID ANGIOGRAM N/A 02/25/2013   Procedure: CAROTID ANGIOGRAM;  Surgeon: Lorretta Harp, MD;  Location[facility] CATH LAB;  Service: Cardiovascular;  Laterality: N/A;   ENDARTERECTOMY Right 03/06/2013   Procedure: ENDARTERECTOMY CAROTID-RIGHT;  Surgeon: Serafina Mitchell, MD;  Location: [place];  Service: Vascular;  Laterality: Right;   ENDARTERECTOMY Left 05/07/2013   Procedure: LEFT CAROTID ARTERY ENDARTERECTOMY WITH VASCU-GUARD PATCH ANGIOPLASTY ;  Surgeon: Serafina Mitchell, MD;  Location: [place];  Service: Vascular;  Laterality: Left;   INTRAOPERATIVE ARTERIOGRAM N/A 06/19/2019   Procedure: Clydie Braun;  Surgeon: Marty Heck, MD;  Location: [place];  Service:  Vascular;  Laterality: N/A;   IR ANGIO INTRA EXTRACRAN SEL COM CAROTID INNOMINATE UNI R MOD SED  05/14/2019   IR ANGIO VERTEBRAL SEL SUBCLAVIAN INNOMINATE BILAT MOD SED  05/14/2019   LOBECTOMY Left 02/03/2016   Procedure: LEFT UPPER LOBECTOMY;  Surgeon: [name], MD;  Location: [place];  Service: Thoracic;  Laterality: Left;   LOWER EXTREMITY ANGIOGRAM N/A 02/25/2013   Procedure: LOWER EXTREMITY ANGIOGRAM;  Surgeon: Lorretta Harp, MD;  Location: [facility] CATH LAB;  Service:  Cardiovascular;  Laterality: N/A;   LOWER EXTREMITY ANGIOGRAM N/A 07/27/2013   Procedure: LOWER EXTREMITY ANGIOGRAM;  Surgeon: Lorretta Harp, MD;  Location: [facility] CATH LAB;  Service: Cardiovascular;  Laterality: N/A;   PATCH ANGIOPLASTY Right 03/06/2013   Procedure: PATCH ANGIOPLASTY of Right Carotid Artery using Vascu-Guard Patch;  Surgeon: Serafina Mitchell, MD;  Location: [place];  Service: Vascular;  Laterality: Right;   PERIPHERAL VASCULAR CATHETERIZATION N/A 11/15/2015   Procedure: Aortic Arch Angiography;  Surgeon: Serafina Mitchell, MD;  Location: [place] CV LAB;  Service: Cardiovascular;  Laterality: N/A;   PERIPHERAL VASCULAR CATHETERIZATION Bilateral 11/15/2015   Procedure: Carotid Angiography;  Surgeon: Serafina Mitchell, MD;  Location: [place] CV LAB;  Service: Cardiovascular;  Laterality: Bilateral;   PERIPHERAL VASCULAR CATHETERIZATION Left 11/15/2015   Procedure: Upper Extremity Angiography;  Surgeon: Serafina Mitchell, MD;  Location: [place] CV LAB;  Service: Cardiovascular;  Laterality: Left;   PERIPHERAL VASCULAR CATHETERIZATION Left 11/15/2015   Procedure: Peripheral Vascular Intervention;  Surgeon: Serafina Mitchell, MD;  Location: [place] CV LAB;  Service: Cardiovascular;  Laterality: Left;  subclavian    RIGHT HEART CATH N/A 06/16/2019   Procedure: RIGHT HEART CATH;  Surgeon: Jolaine Artist, MD;  Location: [place] CV LAB;  Service: Cardiovascular;  Laterality: N/A;   RIGHT/LEFT HEART CATH AND CORONARY ANGIOGRAPHY N/A 11/20/2018   Procedure: RIGHT/LEFT HEART CATH AND CORONARY ANGIOGRAPHY;  Surgeon: Nelva Bush, MD;  Location: [place] CV LAB;  Service: Cardiovascular;  Laterality: N/A;   SALIVARY GLAND SURGERY     scar tissue removed from left saliva glad   TUBAL LIGATION     ULTRASOUND GUIDANCE FOR VASCULAR ACCESS Right 06/19/2019   Procedure: Ultrasound Guidance For Vascular Access;  Surgeon: Marty Heck, MD;  Location: [place];  Service: Vascular;   Laterality: Right;   VIDEO ASSISTED THORACOSCOPY (VATS)/WEDGE RESECTION Left 02/03/2016   Procedure: VIDEO ASSISTED THORACOSCOPY;  Surgeon: [name], MD;  Location: [place];  Service: Thoracic;  Laterality: Left;   Family History  Problem Relation Age of Onset   Stroke Mother    Hypertension Mother    Kidney failure Father    Lung disease Neg Hx    Social History:   reports that she quit smoking about 7 years ago. Her smoking use included cigarettes. She has a 84.00 pack-year smoking history. She quit smokeless tobacco use about 8 years ago. She reports that she does not drink alcohol and does not use drugs.  Medications  Current Facility-Administered Medications:    acetaminophen (TYLENOL) tablet 650 mg, 650 mg, Oral, Q6H, Maxwell, Allee, MD, 650 mg at 05/17/21 1342   atorvastatin (LIPITOR) tablet 40 mg, 40 mg, Oral, Daily, Ganta, Anupa, DO, 40 mg at 05/17/21 1342   chlorhexidine (PERIDEX) 0.12 % solution 15 mL, 15 mL, Mouth Rinse, BID, Kara Mead V, MD, 15 mL at 05/17/21 0858   Chlorhexidine Gluconate Cloth 2 % PADS 6 each, 6 each, Topical, Daily, Shearon Stalls,  Senaida Ores, MD, 6 each at 05/17/21 0430   docusate (COLACE) 50 MG/5ML liquid 100 mg, 100 mg, Oral, Daily, Collene Gobble, MD, 100 mg at 05/17/21 0856   FLUoxetine (PROZAC) capsule 20 mg, 20 mg, Oral, Daily, Collene Gobble, MD, 20 mg at 05/17/21 6256   haloperidol lactate (HALDOL) injection 2 mg, 2 mg, Intravenous, Q15 min PRN, Kipp Brood, MD, 2 mg at 05/15/21 1427   heparin injection 5,000 Units, 5,000 Units, Subcutaneous, Q8H, Corey Harold, NP, 5,000 Units at 05/17/21 3893   hydrALAZINE (APRESOLINE) injection 10 mg, 10 mg, Intravenous, Q6H PRN, Nevada Crane M, PA-C, 10 mg at 05/15/21 1352   HYDROmorphone (DILAUDID) injection 0.5 mg, 0.5 mg, Intravenous, Q2H PRN, Spero Geralds, MD, 0.5 mg at 05/17/21 1343   insulin aspart (novoLOG) injection 0-9 Units, 0-9 Units, Subcutaneous, Q4H, Corey Harold, NP, 2 Units at  05/17/21 1230   MEDLINE mouth rinse, 15 mL, Mouth Rinse, q12n4p, Rigoberto Noel, MD, 15 mL at 05/16/21 1820   metolazone (ZAROXOLYN) tablet 5 mg, 5 mg, Oral, Daily PRN, Ganta, Anupa, DO   metoprolol tartrate (LOPRESSOR) tablet 50 mg, 50 mg, Oral, BID, Collene Gobble, MD, 50 mg at 05/17/21 0856   ondansetron (ZOFRAN) injection 4 mg, 4 mg, Intravenous, Q6H PRN, Rigoberto Noel, MD   oxyCODONE (Oxy IR/ROXICODONE) immediate release tablet 5 mg, 5 mg, Oral, Q4H PRN, Lenoria Chime, MD   pantoprazole (PROTONIX) EC tablet 40 mg, 40 mg, Oral, BID, Franky Macho, RPH, 40 mg at 05/17/21 0856   polyethylene glycol (MIRALAX / GLYCOLAX) packet 17 g, 17 g, Oral, Daily, Ganta, Anupa, DO, 17 g at 05/17/21 1342   revefenacin (YUPELRI) nebulizer solution 175 mcg, 175 mcg, Nebulization, Daily, Nevada Crane M, PA-C, 175 mcg at 05/17/21 0855   torsemide (DEMADEX) tablet 40 mg, 40 mg, Oral, BID, Ganta, Anupa, DO  Exam: Current vital signs: BP (!) 103/55    Pulse 63    Temp (!) 97.4 F (36.3 C) (Oral)    Resp 19    Ht _0  (1.626 m)    Wt 79.3 kg    SpO2 99%    BMI 30.01 kg/m  Vital signs in last 24 hours: Temp:  [97.4 F (36.3 C)-98.6 F (37 C)] 97.4 F (36.3 C) (02/01 1538) Pulse Rate:  [47-121] 63 (02/01 1400) Resp:  [11-24] 19 (02/01 1400) BP: (88-169)/(46-90) 103/55 (02/01 1400) SpO2:  [86 %-100 %] 99 % (02/01 1400)  GENERAL: Awake, alert, in no acute distress Psych: Affect appropriate for situation, patient is calm and cooperative with examination Head: Normocephalic and atraumatic, without obvious abnormality EENT: Normal conjunctivae, dry mucous membranes, no OP obstruction LUNGS: Normal respiratory effort. Non-labored breathing on room air CV: Regular rate and rhythm on telemetry ABDOMEN: Soft, non-tender, non-distended Extremities: right lower extremity with soft casting applied, scattered ecchymoses on the left lower extremity  NEURO:  Mental Status: Awake, alert, and oriented to  person, place, time, and situation. She is unable to provide a clear and coherent history as she does not recall details of how she fell at home or events leading to hospitalization.  Speech/Language: speech is fluent and mildly dysarthric.   No aphasia or neglect is noted Cranial Nerves:  II: PERRL.  III, IV, VI: EOMI without gaze preference or ptosis.  V: Sensation is intact to light touch and symmetrical to face. VII: She has a mild (chronic) left mouth droop  VIII: Hearing is intact to voice IX, X:  Phonation normal.  XI: Normal sternocleidomastoid and trapezius muscle strength XII: Tongue is midline.   Motor: Moves all extremities well and with antigravity movement with the exception of her RLE which is not assessed due to pain. She is able to wiggle the toes on her right foot.  Tone is normal. Bulk is normal.  Sensation: Intact to light touch bilaterally in all four extremities.  Coordination: No dysmetria noted, unable to assess HKS due to pain Gait: Deferred for patient safety  NIHSS:  1a Level of Conscious.: 0 1b LOC Questions: 0 1c LOC Commands: 0 2 Best Gaze: 0 3 Visual: 0 4 Facial Palsy: 0 5a Motor Arm - left: 0 5b Motor Arm - Right: 0 6a Motor Leg - Left: 0 6b Motor Leg - Right: 3 7 Limb Ataxia: 0 8 Sensory: 0 9 Best Language: 0 10 Dysarthria: 1 11 Extinct. and Inatten.: 0 TOTAL: 4  Labs I have reviewed labs in epic and the results pertinent to this consultation are: CBC    Component Value Date/Time   WBC 8.5 05/17/2021 0231   RBC 2.91 (L) 05/17/2021 0231   HGB 9.1 (L) 05/17/2021 0231   HGB 15.1 (H) 03/10/2018 1156   HGB 16.0 (H) 06/03/2017 1414   HGB 16.0 (H) 03/12/2017 1121   HCT 27.6 (L) 05/17/2021 0231   HCT 47.5 (H) 06/03/2017 1414   HCT 48.0 (H) 03/12/2017 1121   PLT 187 05/17/2021 0231   PLT 275 03/10/2018 1156   PLT 269 06/03/2017 1414   MCV 94.8 05/17/2021 0231   MCV 90 06/03/2017 1414   MCV 88.4 03/12/2017 1121   MCH 31.3 05/17/2021 0231    MCHC 33.0 05/17/2021 0231   RDW 12.5 05/17/2021 0231   RDW 13.2 06/03/2017 1414   RDW 12.7 03/12/2017 1121   LYMPHSABS 0.5 (L) 05/15/2021 0825   LYMPHSABS 1.7 03/12/2017 1121   MONOABS 0.5 05/15/2021 0825   MONOABS 0.6 03/12/2017 1121   EOSABS 0.1 05/15/2021 0825   EOSABS 0.1 03/12/2017 1121   BASOSABS 0.0 05/15/2021 0825   BASOSABS 0.1 03/12/2017 1121   CMP     Component Value Date/Time   NA 141 05/17/2021 0231   NA 138 06/03/2017 1414   NA 138 03/12/2017 1121   K 4.5 05/17/2021 0231   K 4.6 03/12/2017 1121   CL 103 05/17/2021 0231   CO2 31 05/17/2021 0231   CO2 27 03/12/2017 1121   GLUCOSE 167 (H) 05/17/2021 0231   GLUCOSE 148 (H) 03/12/2017 1121   BUN 21 05/17/2021 0231   BUN 20 06/03/2017 1414   BUN 19.6 03/12/2017 1121   CREATININE 1.36 (H) 05/17/2021 0231   CREATININE 0.78 03/10/2018 1156   CREATININE 0.9 03/12/2017 1121   CALCIUM 9.2 05/17/2021 0231   CALCIUM 9.6 03/12/2017 1121   PROT 6.5 05/12/2021 1303   PROT 6.9 06/03/2017 1414   PROT 7.7 03/12/2017 1121   ALBUMIN 3.8 05/12/2021 1303   ALBUMIN 4.7 06/03/2017 1414   ALBUMIN 4.2 03/12/2017 1121   AST 20 05/12/2021 1303   AST 24 03/10/2018 1156   AST 38 (H) 03/12/2017 1121   ALT 16 05/12/2021 1303   ALT 29 03/10/2018 1156   ALT 38 03/12/2017 1121   ALKPHOS 58 05/12/2021 1303   ALKPHOS 91 03/12/2017 1121   BILITOT 0.4 05/12/2021 1303   BILITOT 0.4 03/10/2018 1156   BILITOT 0.44 03/12/2017 1121   GFRNONAA 39 (L) 05/17/2021 0231   GFRNONAA >60 03/10/2018 1156   GFRAA >60 10/07/2019 1234  GFRAA >60 03/10/2018 1156   Lipid Panel     Component Value Date/Time   CHOL 135 05/17/2021 0231   TRIG 185 (H) 05/17/2021 0231   HDL 31 (L) 05/17/2021 0231   CHOLHDL 4.4 05/17/2021 0231   VLDL 37 05/17/2021 0231   LDLCALC 67 05/17/2021 0231   Lab Results  Component Value Date   HGBA1C 6.6 (H) 05/17/2021   Imaging I have reviewed the images obtained:  CT-scan of the brain 1/29: No acute intracranial  abnormality.  MRI examination of the brain 1/30: Brain atrophy. Chronic small-vessel ischemic changes as noted above. Acute punctate white matter infarction in the left frontal lobe. No hemorrhage or swelling.  Echocardiogram 1/29:  1. Left ventricular ejection fraction, by estimation, is 65 to 70%. The  left ventricle has normal function. The left ventricle has no regional  wall motion abnormalities. Left ventricular diastolic parameters are  consistent with Grade I diastolic dysfunction (impaired relaxation).   2. Right ventricular systolic function is mildly reduced. The right  ventricular size is not well visualized. There is severely elevated  pulmonary artery systolic pressure. The estimated right ventricular  systolic pressure is 53.6 mmHg.   3. The pericardial effusion is anterior to the right ventricle. Moderate  pleural effusion in the left lateral region.   4. The mitral valve is normal in structure. No evidence of mitral valve  regurgitation. No evidence of mitral stenosis.   5. The aortic valve is normal in structure. Aortic valve regurgitation is  not visualized. No aortic stenosis is present.   6. There is borderline dilatation of the ascending aorta, measuring 39  mm.   7. The inferior vena cava is normal in size with greater than 50%  respiratory variability, suggesting right atrial pressure of 3 mmHg.   Assessment: 81 y.o. female who presented to the ED after a fall at home (second fall within a week) and a right tib-fib fracture with plans for surgical repair. Due to septic shock, the surgery has been delayed. The patient has also developed acute encephalopathy with findings of an incidental left frontal punctate infarction on MRI brain imaging during encephalopathy work up with subsequent neurologic consultation.  - Examination reveals patient with right lower extremity pain and decreased mobilization due to tib-fib fracture but without obvious confusion. She does state  that she cannot recall the fall leading to hospitalization but answers other examiner questions appropriately.  - Imaging as above.  - Presentation of encephalopathy is likely multifactorial with delirium, hypercarbia, and concomitant acute illness and pain. Her punctate stroke on the left is likely an incidental finding and is felt unlikely to be related to patient's acute encephalopathy though she does have risk factors and has been off of her Plavix for preparation for surgery. Unlikely pseudobulbar affect disorder as patient reports this mechanism as a way to mitigate pain for many years.  - Stroke risk factors include patient's advanced age, CKD, HTN, HLD, DM2, PAD, CAD, remote stroke, remote tobacco use.   Recommendations: - CTA head and neck - Notify neurology with CTA abnormalities - Otherwise, stroke work up has been complete in patient with multiple risk factors in the absence of prophylactic therapy in preparation for surgical fixation of closed right tib-fib fracture - Delirium precautions  - Management of co-morbid conditions per primary team as you are   Anibal Henderson, AGAC-NP [facility] Pager: [phone]   The patient was seen and examined with Anibal Henderson, AGAC-NP. The chart and diagnostic studies were  reviewed, discussed assessment and agree with plan as plan as outlined in the note above.   Electronically signed by:  Lynnae Sandhoff, MD Page: [phone] 05/17/2021, 6:53 PM

## 2021-05-17 NOTE — Progress Notes (Signed)
Family Medicine Teaching Service Daily Progress Note Intern Pager: [phone]  Patient name: Caitlin Oliver Medical record number: [id number] Date of birth: 1941-01-25 Age: 81 y.o. Gender: female  Primary Care Provider: [name], PA-C Consultants: PCCM, ortho Code Status: DNR (confirmed with PCCM)  Pt Overview and Major Events to Date:  1/27: Admitted to [facility] 1/28: Found to be hypotensive, tachycardic, encephalopathic, and febrile//Levophed started and admitted to ICU  1/29: On BiPAP in ICU, CT head-no acute abnormality 1/30: MRI brain: atrophy, chronic small vessel ischemic changes, acute punctate white matter infarct of L frontal lobe 1/30: On Precedex/Ativan/Haldol for agitation   Assessment and Plan:  81 year old female with multiple medical problems significant for type 2 diabetes, carotid artery stenosis status post bilateral endarterectomies, extensive PAD, pulmonary hypertension, and COPD.   Acute Metabolic Encephalopathy vs delirium, multifactorial  Likely multifactorial in origin. Acute white matter infarct of left frontal lobe on MRI. Pt has long term benzodiazepine use and CO2 retention on ABG (was on BiPaP but back to [place]). Considerations for labs if symptoms continue and require further work up: Ammonia, serum osms, TFTs, B12, serum cortisol (unlikely endocrine cause in nature), toxicology screens. Consider neurology consult if encephalopathy continues during hospitalization without clear cause. Pseudobulbar effect due to frontal lobe infarct (unable to perform finger to nose testing-cerebellum involvement?). -Continue PRN Haloperidol -s/p precedex and lorazepam -consider neurology consult  -discuss case with ortho for surgery time frame (see problem below)   Undifferentiated Shock in setting of tachycardia (initially hypotensive and febrile)   SVT   Pulmonary HTN  Patient had a couple of episodes of dizziness and subsequent syncope prior to admission. SVT has  resolved s/p Amiodarone. Afebrile for over 24 hours and no longer hypotensive s/p Levophed(currently not receiving pressors).  Tachycardia has resolved as well. Echo performed on 1/29 LVEF 65-70% with grade 1 DD, RVSP 78. PNA? Seen on previous CXR, abx started as below. Today, wbc 7.9--->8.5.  -Empiric CTX without clear infectious source (day 5/5) -s/p amiodarone, SVTs resolved  -Monitor regular CBCs -Monitor VS    Right tibia and fibula closed fracture 2/2 fall from standing position  Initial plan was to place an intramedullary nail in the right tibia to stabilized the fracture. Patient was initially hypertensive and febrile prior to potential surgery, so orthopedic surgery decided to medically optimize her prior to proceeding with and canceled surgery on 1/28. Discuss case with orthopedic surgery to determine medically optimized time to proceed to OR.  -Plavix still on hold for now  -Ortho consulted, follow up and appreciate recommendations  -Tylenol q6PRN 650 mg  -Dilaudid 0.5 mg IV q2PRN  -Oxy 92m q4PRN -Zofran 4 mg q6PRN  -Will needed PT/OT after surgery, likely will need SNF placement vs outpt PT/OT   Chronic Hypoxic Respiratory Failure 2/2 COPD GOLD 2   Chronic chest pain Patient is on 4 L [place]---> off of bipap. She is close to baseline oxygen requirement (3L). Mentation improved today, will continue to follow oxygen saturation  -O2 4L [place] -Albuterol 108 mcg inhaler 2 puffs prn -Monitor respiratory status   Chronic HFpEF Echo performed on 1/29 LVEF 65-70% with grade 1 DD, RVSP 78. She is prescribed metoprolol tartrate 50 mg BID, metolazone 5 mg PRN, and torsemide 20 mg daily, we were holding home diuretics due to instability.  -Continue home regimen as above  -I/Os -Monitor volume status   CKD stage 3B, stable  CR 1.46---->1.36. GFR 39. -Avoid nephrotoxic agents as able  -AM BMP  carotid artery stenosis status post bilateral endarterectomies, extensive PAD -Continue high  intensity statin 2m Atorvastatin  -Lipid panel ordered  -Hold plavix given upcoming surgery  T2DM with neuropathy   Patient's hemoglobin A1c on 05/04/2021 was 7.1.  She is currently on metformin 500 mg twice daily and gabapentin 300 mg daily.  Abdominal Pain   Constipation  Patient takes Librax 5-2.5 mg twice daily, Colace 100 mg twice daily as needed for constipation, Bentyl 20 mg daily for spasms, MiraLAX 17 mg daily.  Current regimen in hospital as Colace 100 mg daily and MiraLAX 17 g daily - Monitor bowel movements -Regular abd exams   Chronic  GERD   Depression  Anxiety  Patient is currently prescribed pantoprazole 40 mg twice daily for GERD and fluoxetine 20 mg daily for depression.  Patient takes Valium every 8 hours as needed for anxiety. - Continue pantoprazole and fluoxetine - Continue holding Valium    FEN/GI: Heart healthy  PPx: SCDs Dispo: Pending PT  pending clinical improvement .  Subjective:  Pt is still in pain but otherwise feels well. Denies any new concerns.   Objective: Temp:  [97.8 F (36.6 C)-98.6 F (37 C)] 97.9 F (36.6 C) (02/01 1137) Pulse Rate:  [47-121] 47 (02/01 1156) Resp:  [11-24] 15 (02/01 1156) BP: (88-169)/(46-89) 132/63 (02/01 1100) SpO2:  [86 %-100 %] 100 % (02/01 1156) FiO2 (%):  [3 %] 3 % (01/31 1400) Physical Exam: General: NAD, elderly female, resting in bed, nontoxic appearing  Cardiovascular: RRR, no m/g/r Respiratory: CTAB, no evidence of crackles or stridor  Abdomen: Nontender to palpation, nondistended, soft  Extremities: long leg brace on right leg  Neuro: A&Ox4, normal sensation. 4/4 strength upper extremities. Pseudobulbar effect with difficulty tracking with eyes and inability to continue with FTN testing.   Laboratory: Recent Labs  Lab 05/15/21 0825 05/16/21 0202 05/17/21 0231  WBC 6.0 7.9 8.5  HGB 9.0* 8.8* 9.1*  HCT 27.5* 27.7* 27.6*  PLT 176 198 187   Recent Labs  Lab 05/12/21 1303 05/13/21 0219  05/15/21 0825 05/16/21 0202 05/17/21 0231  NA 137   < > 148* 145 141  K 4.9   < > 4.1 3.5 4.5  CL 96*   < > 105 102 103  CO2 34*   < > 31 32 31  BUN 19   < > 26* 26* 21  CREATININE 1.74*   < > 1.55* 1.46* 1.36*  CALCIUM 9.1   < > 9.4 9.1 9.2  PROT 6.5  --   --   --   --   BILITOT 0.4  --   --   --   --   ALKPHOS 58  --   --   --   --   ALT 16  --   --   --   --   AST 20  --   --   --   --   GLUCOSE 213*   < > 176* 125* 167*   < > = values in this interval not displayed.    Hgb 8.8--->9.1 Cr 1.46--->1.36  Imaging/Diagnostic Tests: No new   MErskine Emery MD 05/17/2021, 12:13 PM PGY-1, [facility] pager: [phone] text pages welcome

## 2021-05-17 NOTE — Progress Notes (Signed)
Patient ID: Caitlin Oliver, female   DOB: 1940-11-24, 81 y.o.   MRN: [id number] The patient is still in the unit but I think that they are working on getting her transition to a regular floor bed.  I did come by to see her and cognitively she is much improved and does understand that she has a right tibia fracture and surgery is being considered.  She was admitted this past Friday and by tomorrow she will be about a week off from her last dose of Plavix.  She does have lung issues and the safer anesthesia from a perspective of her lungs would be spinal anesthesia and a nerve block.  She is in a well padded splint and does report appropriate right leg pain.  Her foot still seems to be well-perfused and she will move her toes fully.  Nonoperative treatment of this would involve eventually transitioning her to a long-leg cast and at least 6 weeks or so casting.  The surgical option would be considering an intramedullary nail.  I will reach out to the orthopedic trauma service again with Dr. Doreatha Martin and Dr. Marcelino Scot to assess their availability for addressing his fracture in the future.  There is no rush in terms of getting her to the operating room, it just needs to be done in a safe manner as possible.  And again that is if surgery is decided would be in her best interest.

## 2021-05-18 ENCOUNTER — Inpatient Hospital Stay (HOSPITAL_COMMUNITY): Payer: PPO

## 2021-05-18 DIAGNOSIS — T148XXA Other injury of unspecified body region, initial encounter: Secondary | ICD-10-CM | POA: Diagnosis not present

## 2021-05-18 DIAGNOSIS — S82201A Unspecified fracture of shaft of right tibia, initial encounter for closed fracture: Secondary | ICD-10-CM | POA: Diagnosis not present

## 2021-05-18 DIAGNOSIS — J9621 Acute and chronic respiratory failure with hypoxia: Secondary | ICD-10-CM | POA: Diagnosis not present

## 2021-05-18 DIAGNOSIS — R410 Disorientation, unspecified: Secondary | ICD-10-CM | POA: Diagnosis not present

## 2021-05-18 LAB — BLOOD GAS, ARTERIAL
Acid-Base Excess: 10.9 mmol/L — ABNORMAL HIGH (ref 0.0–2.0)
Acid-Base Excess: 12.4 mmol/L — ABNORMAL HIGH (ref 0.0–2.0)
Bicarbonate: 36.5 mmol/L — ABNORMAL HIGH (ref 20.0–28.0)
Bicarbonate: 37.7 mmol/L — ABNORMAL HIGH (ref 20.0–28.0)
Drawn by: 31394
Drawn by: 31394
FIO2: 32
FIO2: 36
O2 Saturation: 97.4 %
O2 Saturation: 96.5 %
Patient temperature: 36.6
Patient temperature: 36.8
pCO2 arterial: 63.6 mmHg — ABNORMAL HIGH (ref 32.0–48.0)
pCO2 arterial: 60.5 mmHg — ABNORMAL HIGH (ref 32.0–48.0)
pH, Arterial: 7.374 (ref 7.350–7.450)
pH, Arterial: 7.41 (ref 7.350–7.450)
pO2, Arterial: 87.8 mmHg (ref 83.0–108.0)
pO2, Arterial: 76.2 mmHg — ABNORMAL LOW (ref 83.0–108.0)

## 2021-05-18 LAB — CULTURE, BLOOD (ROUTINE X 2)
Culture: NO GROWTH
Culture: NO GROWTH
Special Requests: ADEQUATE

## 2021-05-18 LAB — BASIC METABOLIC PANEL
Anion gap: 9 (ref 5–15)
BUN: 20 mg/dL (ref 8–23)
CO2: 36 mmol/L — ABNORMAL HIGH (ref 22–32)
Calcium: 9 mg/dL (ref 8.9–10.3)
Chloride: 95 mmol/L — ABNORMAL LOW (ref 98–111)
Creatinine, Ser: 1.5 mg/dL — ABNORMAL HIGH (ref 0.44–1.00)
GFR, Estimated: 35 mL/min — ABNORMAL LOW (ref >60)
Glucose, Bld: 170 mg/dL — ABNORMAL HIGH (ref 70–99)
Potassium: 3.7 mmol/L (ref 3.5–5.1)
Sodium: 140 mmol/L (ref 135–145)

## 2021-05-18 LAB — CBC
HCT: 28 % — ABNORMAL LOW (ref 36.0–46.0)
Hemoglobin: 8.9 g/dL — ABNORMAL LOW (ref 12.0–15.0)
MCH: 30.2 pg (ref 26.0–34.0)
MCHC: 31.8 g/dL (ref 30.0–36.0)
MCV: 94.9 fL (ref 80.0–100.0)
Platelets: 196 10*3/uL (ref 150–400)
RBC: 2.95 MIL/uL — ABNORMAL LOW (ref 3.87–5.11)
RDW: 12.3 % (ref 11.5–15.5)
WBC: 8.2 10*3/uL (ref 4.0–10.5)
nRBC: 0.2 % (ref 0.0–0.2)

## 2021-05-18 LAB — GLUCOSE, CAPILLARY
Glucose-Capillary: 144 mg/dL — ABNORMAL HIGH (ref 70–99)
Glucose-Capillary: 170 mg/dL — ABNORMAL HIGH (ref 70–99)
Glucose-Capillary: 184 mg/dL — ABNORMAL HIGH (ref 70–99)
Glucose-Capillary: 185 mg/dL — ABNORMAL HIGH (ref 70–99)
Glucose-Capillary: 236 mg/dL — ABNORMAL HIGH (ref 70–99)
Glucose-Capillary: 294 mg/dL — ABNORMAL HIGH (ref 70–99)

## 2021-05-18 MED ORDER — ASPIRIN EC 81 MG PO TBEC
81.0000 mg | DELAYED_RELEASE_TABLET | Freq: Every day | ORAL | Status: DC
  Administered 2021-05-19 – 2021-05-27 (×8): 81 mg via ORAL
  Filled 2021-05-18 (×8): qty 1

## 2021-05-18 MED ORDER — SENNA 8.6 MG PO TABS
1.0000 | ORAL_TABLET | Freq: Every day | ORAL | Status: DC
  Administered 2021-05-18 – 2021-05-24 (×5): 8.6 mg via ORAL
  Filled 2021-05-18 (×5): qty 1

## 2021-05-18 MED ORDER — IOHEXOL 350 MG/ML SOLN
65.0000 mL | Freq: Once | INTRAVENOUS | Status: AC | PRN
  Administered 2021-05-18: 65 mL via INTRAVENOUS

## 2021-05-18 MED ORDER — POTASSIUM CHLORIDE 20 MEQ PO PACK
40.0000 meq | PACK | Freq: Once | ORAL | Status: AC
  Administered 2021-05-18: 40 meq via ORAL
  Filled 2021-05-18: qty 2

## 2021-05-18 MED ORDER — ASPIRIN EC 81 MG PO TBEC
81.0000 mg | DELAYED_RELEASE_TABLET | Freq: Every day | ORAL | Status: DC
Start: 2021-05-18 — End: 2021-05-18

## 2021-05-18 NOTE — Progress Notes (Signed)
Patient complaining of "feeling sick". RN notified and will be giving Zofran. Will hold on BiPAP placement until patient isn't feeling nauseous. RN aware.

## 2021-05-18 NOTE — Progress Notes (Signed)
Family Medicine Teaching Service Daily Progress Note Intern Pager: [phone]  Patient name: Caitlin Oliver Medical record number: [id number] Date of birth: 04/27/1940 Age: 81 y.o. Gender: female  Primary Care Provider: [name], PA-C Consultants: PCCM, Neuro, Ortho Code Status: DNR  Pt Overview and Major Events to Date:  1/27: Admitted to [facility] 1/28: Found to be hypotensive, tachycardic, encephalopathic, and febrile//Levophed started and admitted to ICU  1/29: On BiPAP in ICU, CT head-no acute abnormality 1/30: MRI brain: atrophy, chronic small vessel ischemic changes, acute punctate white matter infarct of L frontal lobe 1/30: On Precedex/Ativan/Haldol for agitation   Assessment and Plan: [name] 81 year old female with multiple medical problems including type 2 diabetes, carotid artery stenosis status post bilateral endarterectomies, extensive PAD, pulmonary hypertension, and COPD.  Acute Metabolic Encephalopathy vs Delirium, multifactorial Neurologic status is improved from yesterday, appears to have a waxing and waning mental status.  Patient was on BiPAP overnight and did well with it.  Patient does have history of long-term benzodiazepine use, which has been discontinued during this hospitalization. - Neurology consulted, appreciate recs - As needed Dilaudid 0.5 mg every 2 hours - Continue BiPap  Right Tib-Fib Closed Fracture d/t fall from standing Working to medically optimize for placement of intramedullary nail. Procedure planned for beginning of next week.  Discontinue oxy and Haldol due to waxing and waning mentation. - Ortho following, appreciate recs - Pain control  Tylenol 645m Q6H PRN  Dilaudid 0.554mIV Q2H PRN  Acute Left Frontal CVA DAPT initially held for surgery date, will resume low-dose aspirin low-dose aspirin restarted as surgical intervention appears to be next week.  We will continue to hold Plavix for planned surgical intervention -  Neurology consulted, appreciate recs - Holding home Plavix for surgery, will restart when deemed appropriate - ASA started  Chronic hypoxemic respiratory failure (on 3 L [place])  HFpEF (EF 65-70%, per echo on 05/14/2021) Pulmonary HTN RVSP 78 on echocardiogram.  Patient had BiPAP in place this morning, is back on baseline 3 L [place]. - Supplemental O2 to maintain saturations 92-97%  Constipation Last documented stool was 1/27, unsure if this is accurate. Patient does have scheduled miralax and senna. Exam does not show signs of acute abdomen and there is some discomfort with palpation. - Obtain KUB - On scheduled MiraLAX and senna, will need to monitor and consider enema if appropriate (though would prefer to avoid if able)  CKD stage IIIa Creatinine has been steadily increasing from 1.36>1.5>1.79.  Patient has tenuous volume status with history of CHF and issues with respiratory status.  At this time, do feel that it is reasonable to give small fluid bolus and continue to monitor. - Decreased torsemide to once daily - Continue to trend BMP  Type 2 diabetes Diet controlled -Monitor with glucose on BMP  Decreased oral intake Feel that patient's nutritional status could use assistance to optimize, especially in the setting of impending surgery.  - Nutrition consulted   Undifferentiated shock, resolved SVT, resolved  (remains on telemetry)  FEN/GI: Heart healthy  PPx: Subcutaneous heparin Dispo: Pending surgical recs  in 3 or more days. Barriers include continued medical work-up and surgery.   Subjective:  Patient reports no specific complaints today.  She is not sure exactly why she is in the hospital.  When specifically asking about different areas of the body she would intermittently report pain.  Objective: Temp:  [97.9 F (36.6 C)-98.6 F (37 C)] 98.1 F (36.7 C) (02/03 0351) Pulse  Rate:  [51-98] 58 (02/03 0351) Resp:  [16-18] 16 (02/03 0351) BP: (93-113)/(53-68) 112/68 (02/03  0351) SpO2:  [98 %-100 %] 98 % (02/03 0351) FiO2 (%):  [35 %] 35 % (02/02 1415) Physical Exam: General: NAD, elderly frail female supine in bed Cardiovascular: RRR, no peripheral edema noted Respiratory: Clear to auscultation bilaterally, BiPAP in place Abdomen: Soft, diffusely tender, bowel sounds present, nondistended Extremities: Ace wrap splint on right leg, small healing bruises on left shin. Neurologic: Patient A&O x4, 5/5 BUE strength.  Difficult to carry conversation as patient had a BiPAP in place.  Laboratory: Recent Labs  Lab 05/17/21 0231 05/18/21 0244 05/19/21 0230  WBC 8.5 8.2 8.4  HGB 9.1* 8.9* 9.1*  HCT 27.6* 28.0* 28.8*  PLT 187 196 234   Recent Labs  Lab 05/12/21 1303 05/13/21 0219 05/17/21 0231 05/18/21 0244 05/19/21 0230  NA 137   < > 141 140 143  K 4.9   < > 4.5 3.7 3.7  CL 96*   < > 103 95* 94*  CO2 34*   < > 31 36* 37*  BUN 19   < > 21 20 25*  CREATININE 1.74*   < > 1.36* 1.50* 1.79*  CALCIUM 9.1   < > 9.2 9.0 9.2  PROT 6.5  --   --   --   --   BILITOT 0.4  --   --   --   --   ALKPHOS 58  --   --   --   --   ALT 16  --   --   --   --   AST 20  --   --   --   --   GLUCOSE 213*   < > 167* 170* 159*   < > = values in this interval not displayed.     Imaging/Diagnostic Tests: No results found.   Rise Patience, DO 05/19/2021, 7:13 AM PGY-2, [place] Intern pager: [phone], text pages welcome

## 2021-05-18 NOTE — Progress Notes (Signed)
FPTS Brief Progress Note  S: Spoke with primary RN who reports pt talked with her nephew tonight who felt she was not yet at her baseline. She woke enough to take her evening medications but went back to sleep.    O: BP (!) 113/58 (BP Location: Right Arm)    Pulse 65    Temp 98.3 F (36.8 C) (Oral)    Resp 16    Ht _0  (1.626 m)    Wt 79.3 kg    SpO2 100%    BMI 30.01 kg/m    GEN:  elderly female asleep upon entering the room but awakens once at bedside CV: regular rate and rhythm on cardiac monitor  RESP: no increased work of breathing, mouth open, wearing 4L [place] MSK: RLE wrapped  SKIN: warm, dry NEURO: follows commands, clear speech with me and RT   A/P: Acute Encephalopathy: Etiology uncertain. Per family remains altered. RT here to place BiPAP on for the night.  -Reassess mental status in AM  - Orders reviewed. Labs for AM ordered, which was adjusted as needed.    Lyndee Hensen, DO 05/18/2021, 11:32 PM PGY-3, [facility] Night Resident  Please page [phone] with questions.

## 2021-05-18 NOTE — Progress Notes (Addendum)
FPTS Brief Progress Note  S: Pt in pain, requesting her ankle to fixed. " No more sick, no more pain."  "I want my ankle fixed."  Primary RN reports pt not accepting medication.    O: BP 122/60 (BP Location: Left Arm)    Pulse 64    Temp (!) 97.5 F (36.4 C)    Resp 16    Ht _0  (1.626 m)    Wt 79.3 kg    SpO2 98%    BMI 30.01 kg/m    GEN: moving about in bed  RESP: speaking in full sentences  ABD: retching, basin on the bed  NEURO: oriented x3    A/P: CTA Head & Neck ordered, added Delirium precautions per neurology recommendations.   Ankle Fx: Discussed ortho surgery delay. Give PRN pain and nausea medications, pt agreeable.    - Orders reviewed. Labs for AM ordered, which was adjusted as needed.    Lyndee Hensen, DO 05/18/2021, 2:57 AM PGY-3, [facility] Night Resident  Please page [phone] with questions.

## 2021-05-18 NOTE — Progress Notes (Signed)
ABG results reviewed and patient to be left off BiPAP and reassessed later per Nancy Fetter, MD.

## 2021-05-18 NOTE — Progress Notes (Signed)
Family Medicine Teaching Service Daily Progress Note Intern Pager: [phone]  Patient name: Caitlin Oliver Medical record number: [id number] Date of birth: 1940/04/27 Age: 81 y.o. Gender: female  Primary Care Provider: [name], PA-C Consultants: PCCM, Neurology, Ortho Code Status: DNR (confirmed with PCCM)  Pt Overview and Major Events to Date:  1/27: Admitted to [facility] 1/28: Found to be hypotensive, tachycardic, encephalopathic, and febrile//Levophed started and admitted to ICU  1/29: On BiPAP in ICU, CT head-no acute abnormality 1/30: MRI brain: atrophy, chronic small vessel ischemic changes, acute punctate white matter infarct of L frontal lobe 1/30: On Precedex/Ativan/Haldol for agitation    Assessment and Plan:  [name] 81 year old female with multiple medical problems including type 2 diabetes, carotid artery stenosis status post bilateral endarterectomies, extensive PAD, pulmonary hypertension, and COPD.   Acute metabolic encephalopathy versus delirium, likely multifactorial Hypercarbia and long-term benzodiazepine use as well as recent shock all likely contribute to current encephalopathy.  Neurology has been consulted and recommended CTA for incidental finding of acute white matter infarct of the left frontal lobe.  On exam today, the patient is more sedated and more easily distracted.  However on my exam this a.m., she is alert and oriented x4.  Discontinue Haldol.  Given her acute change in mental status, we will continue with BiPAP as ABG shows elevated CO2 to 63.  We will continue with another blood gas once BiPAP has been on for several hours.  If her mentation still appears to be altered from yesterday, approach other options of acute encephalopathy.  Continue to monitor mental status. - As needed Haldol - Neurology consulted, appreciate recommendations - Continue with BiPAP - Repeat ABG in the afternoon  Acute Left Frontal CVA Incidental findings of acute  CVA on MRI as noted above.  Neurology has been consulted, and they recommended continue with CTA.  Patient has extensive history of carotid artery stenosis with bilateral endarterectomies.  CTA today shows: Punctate left frontal white matter lacunar infarcts seen on MRI, no large vessel occlusion but chronic severe atherosclerosis of the aortic arch, possible residual high-grade stenosis of the right CCA origin, stable stenosis of the right ICA and proximal to the bifurcation with unchanged chronic high-grade stenosis of the left CCA origin, progressed calcified atherosclerosis of the right MCA posterior M2 branch.  Patient has known longstanding history of stenosis.  We will discuss these findings with neurology and follow-up with their recommendations. - Appreciate neurology recommendations - Holding home Plavix for upcoming surgery  Right tib-fib closed fracture secondary to fall from standing position Working with orthopedic surgery for medically optimized procedure to place intramedullary nail.  They plan to continue with surgery at the beginning of next week.  Hold off on home Plavix as she is awaiting surgery.  Last Dilaudid was given today at 3 AM, last Oxy was given yesterday at 2100. Pain status today was difficult to assess given altered mental status.  We have discontinued oxycodone to decrease mentation altering medications. -Plavix on hold for now pending surgical intervention - Follow Ortho recommendations - Tylenol every 6 as needed 650 mg - Dilaudid 0.5 mg IV every 2 as needed    Undifferentiated shock in the setting of tachycardia, resolved  SVT  pulmonary hypertension Patient has transitioned out of the ICU without evidence of hemodynamic instability.  She completed course of ceftriaxone yesterday.  Today WBC 8.5--->8.2.  SVT has resolved status post amiodarone.  Echo on 1/29 showed LVEF 65 to 70% with grade  1 diastolic dysfunction, RVSP 78.  - Monitor for infectious signs - Monitor  regular CBC - Cardiac monitoring   FEN/GI: Heart healthy diet PPx: Heparin 5000 units subq Dispo: Pending surgical evaluation and intervention as well as work-up for mentation changes.  Subjective:  Patient is present in room with her niece, Caitlin Oliver.  Caitlin Oliver reports that prior to being admitted to the hospital, patient was able to handle her finances and that current mentation status is new from her baseline.  She is very sleepy this a.m. and difficult to keep on track.  Patient reports that she is currently in pain.  Objective: Temp:  [97.4 F (36.3 C)-98.1 F (36.7 C)] 97.9 F (36.6 C) (02/02 0737) Pulse Rate:  [47-98] 98 (02/02 0737) Resp:  [11-21] 17 (02/02 0737) BP: (103-155)/(47-90) 111/64 (02/02 0737) SpO2:  [96 %-100 %] 98 % (02/02 0737) Physical Exam: General: No acute distress, sleepy elderly, frail woman Cardiovascular: RRR, no M/R/G Respiratory: CTA B Abdomen: Nondistended and soft Extremities: Presence of Ace wrap splint on right leg with good circulation and ability to move toes.  Palpable dorsalis pedis pulses in left leg, unable to assess and right.  No tenderness to palpation of right hip. Neurologic exam: Patient appears to be more distractible and sleepy on exam.  She is still alert and oriented x4 with good upper extremity strength 5/5 bilaterally.  No obvious evidence of facial drooping, unable to assess other cranial nerves due to lack of participation in exam.  Laboratory: Recent Labs  Lab 05/16/21 0202 05/17/21 0231 05/18/21 0244  WBC 7.9 8.5 8.2  HGB 8.8* 9.1* 8.9*  HCT 27.7* 27.6* 28.0*  PLT 198 187 196   Recent Labs  Lab 05/12/21 1303 05/13/21 0219 05/16/21 0202 05/17/21 0231 05/18/21 0244  NA 137   < > 145 141 140  K 4.9   < > 3.5 4.5 3.7  CL 96*   < > 102 103 95*  CO2 34*   < > 32 31 36*  BUN 19   < > 26* 21 20  CREATININE 1.74*   < > 1.46* 1.36* 1.50*  CALCIUM 9.1   < > 9.1 9.2 9.0  PROT 6.5  --   --   --   --   BILITOT 0.4  --   --   --    --   ALKPHOS 58  --   --   --   --   ALT 16  --   --   --   --   AST 20  --   --   --   --   GLUCOSE 213*   < > 125* 167* 170*   < > = values in this interval not displayed.     Erskine Emery, MD 05/18/2021, 8:10 AM PGY-1, [place] Intern pager: [phone], text pages welcome

## 2021-05-18 NOTE — Plan of Care (Signed)
Problem: Education: Goal: Knowledge of General Education information will improve Description: Including pain rating scale, medication(s)/side effects and non-pharmacologic comfort measures Outcome: Progressing   Problem: Health Behavior/Discharge Planning: Goal: Ability to manage health-related needs will improve Outcome: Progressing   Problem: Clinical Measurements: Goal: Ability to maintain clinical measurements within normal limits will improve Outcome: Progressing Goal: Will remain free from infection Outcome: Progressing   Problem: Activity: Goal: Risk for activity intolerance will decrease Outcome: Progressing   Problem: Nutrition: Goal: Adequate nutrition will be maintained Outcome: Progressing   Problem: Coping: Goal: Level of anxiety will decrease Outcome: Progressing

## 2021-05-18 NOTE — Progress Notes (Signed)
SLP Cancellation Note  Patient Details Name: Caitlin Oliver MRN: [id number] DOB: 02/07/1941   Cancelled treatment:        Pt currently on Bipap and resting after feeling nauseous. Will continue attempts for cognitive treatment   Houston Siren 05/18/2021, 2:48 PM

## 2021-05-19 ENCOUNTER — Inpatient Hospital Stay (HOSPITAL_COMMUNITY): Payer: PPO

## 2021-05-19 DIAGNOSIS — R0602 Shortness of breath: Secondary | ICD-10-CM

## 2021-05-19 DIAGNOSIS — K5900 Constipation, unspecified: Secondary | ICD-10-CM

## 2021-05-19 DIAGNOSIS — R410 Disorientation, unspecified: Secondary | ICD-10-CM | POA: Diagnosis not present

## 2021-05-19 DIAGNOSIS — I639 Cerebral infarction, unspecified: Secondary | ICD-10-CM | POA: Diagnosis not present

## 2021-05-19 DIAGNOSIS — J9621 Acute and chronic respiratory failure with hypoxia: Secondary | ICD-10-CM | POA: Diagnosis not present

## 2021-05-19 LAB — CBC
HCT: 28.8 % — ABNORMAL LOW (ref 36.0–46.0)
Hemoglobin: 9.1 g/dL — ABNORMAL LOW (ref 12.0–15.0)
MCH: 30.1 pg (ref 26.0–34.0)
MCHC: 31.6 g/dL (ref 30.0–36.0)
MCV: 95.4 fL (ref 80.0–100.0)
Platelets: 234 10*3/uL (ref 150–400)
RBC: 3.02 MIL/uL — ABNORMAL LOW (ref 3.87–5.11)
RDW: 12.5 % (ref 11.5–15.5)
WBC: 8.4 10*3/uL (ref 4.0–10.5)
nRBC: 0.2 % (ref 0.0–0.2)

## 2021-05-19 LAB — BASIC METABOLIC PANEL
Anion gap: 12 (ref 5–15)
BUN: 25 mg/dL — ABNORMAL HIGH (ref 8–23)
CO2: 37 mmol/L — ABNORMAL HIGH (ref 22–32)
Calcium: 9.2 mg/dL (ref 8.9–10.3)
Chloride: 94 mmol/L — ABNORMAL LOW (ref 98–111)
Creatinine, Ser: 1.79 mg/dL — ABNORMAL HIGH (ref 0.44–1.00)
GFR, Estimated: 28 mL/min — ABNORMAL LOW (ref >60)
Glucose, Bld: 159 mg/dL — ABNORMAL HIGH (ref 70–99)
Potassium: 3.7 mmol/L (ref 3.5–5.1)
Sodium: 143 mmol/L (ref 135–145)

## 2021-05-19 LAB — GLUCOSE, CAPILLARY
Glucose-Capillary: 175 mg/dL — ABNORMAL HIGH (ref 70–99)
Glucose-Capillary: 177 mg/dL — ABNORMAL HIGH (ref 70–99)
Glucose-Capillary: 182 mg/dL — ABNORMAL HIGH (ref 70–99)
Glucose-Capillary: 184 mg/dL — ABNORMAL HIGH (ref 70–99)
Glucose-Capillary: 229 mg/dL — ABNORMAL HIGH (ref 70–99)
Glucose-Capillary: 245 mg/dL — ABNORMAL HIGH (ref 70–99)

## 2021-05-19 MED ORDER — ENSURE MAX PROTEIN PO LIQD
11.0000 [oz_av] | Freq: Every day | ORAL | Status: DC
  Administered 2021-05-19 – 2021-05-24 (×3): 11 [oz_av] via ORAL

## 2021-05-19 MED ORDER — GLUCERNA SHAKE PO LIQD
237.0000 mL | Freq: Two times a day (BID) | ORAL | Status: DC
  Administered 2021-05-20 – 2021-05-25 (×8): 237 mL via ORAL

## 2021-05-19 MED ORDER — ADULT MULTIVITAMIN W/MINERALS CH
1.0000 | ORAL_TABLET | Freq: Every day | ORAL | Status: DC
  Administered 2021-05-19 – 2021-05-27 (×8): 1 via ORAL
  Filled 2021-05-19 (×8): qty 1

## 2021-05-19 MED ORDER — POTASSIUM CHLORIDE 20 MEQ PO PACK: 40.0000 meq | PACK | Freq: Once | ORAL | Status: DC

## 2021-05-19 MED ORDER — TORSEMIDE 20 MG PO TABS: 40.0000 mg | ORAL_TABLET | Freq: Every day | ORAL | Status: DC

## 2021-05-19 NOTE — Plan of Care (Signed)

## 2021-05-19 NOTE — Progress Notes (Signed)
Initial Nutrition Assessment  DOCUMENTATION CODES:      INTERVENTION:   -Glucerna Shake po BID, each supplement provides 220 kcal and 10 grams of protein  -Ensure Max po daily, each supplement provides 150 kcal and 30 grams of protein -MVI with minerals daily -Feeding assistance with meals  NUTRITION DIAGNOSIS:   Increased nutrient needs related to post-op healing as evidenced by estimated needs.  GOAL:   Patient will meet greater than or equal to 90% of their needs  MONITOR:   PO intake, Supplement acceptance, Labs, Weight trends, Skin, I & O's  REASON FOR ASSESSMENT:   Consult Assessment of nutrition requirement/status  ASSESSMENT:   Caitlin Oliver is a pleasant 81 yo female with a PMH of T2DM, carotid artery stenosis s/p bilateral endarterectomies, PVD, PAH and HFpEF, CVA, lung cancer, COPD on 3L who presents after a fall at home where her leg "gave out on her" and she fell and was unable to get up.  Pt admitted with rt tibia and fibula fracture s/p fall.   1/30- s/p BSE- advanced to regular diet with thin liquids 2/1- MRI revealed acute CVA  Reviewed I/O's: -1.5 L x 24 hours and -6 L since admission  UOP: 1.5 L x 24 hours  Pt unavailable at time of visit. Attempted to speak with pt via call to hospital room phone, however, unable to reach. RD unable to obtain further nutrition-related history or complete nutrition-focused physical exam at this time.    Per MD notes, pt awaiting medical optimization prior to proceeding with IMN (suspect beginning of next week). Pt continues with waxing and waning mental status.    Pt currently on a heart healthy, carb modified diet. Meal completions 20-80%.   Reviewed wt hx; wt has been stable over the past 6 months.   Medications reviewed and include colace, miralax, senna, and demadex.   Lab Results  Component Value Date   HGBA1C 6.6 (H) 05/17/2021   PTA DM medications are 500 mg metformin BID.   Labs reviewed: CBGS: 185-245  (inpatient orders for glycemic control are 0-9 units inuslin aspart every 4 hours).    Diet Order:   Diet Order             Diet NPO time specified Except for: Sips with Meds  Diet effective midnight           Diet heart healthy/carb modified Room service appropriate? Yes; Fluid consistency: Thin  Diet effective now                   EDUCATION NEEDS:      Skin:  Skin Assessment: Reviewed RN Assessment  Last BM:  05/12/21  Height:   Ht Readings from Last 1 Encounters:  05/12/21 5' 4" (1.626 m)    Weight:   Wt Readings from Last 1 Encounters:  05/12/21 79.3 kg    Ideal Body Weight:  54.5 kg  BMI:  Body mass index is 30.01 kg/m.  Estimated Nutritional Needs:   Kcal:  1700-1900  Protein:  85-100 grams  Fluid:  > 1.7 L    Caitlin Oliver, RD, LDN, [place] Registered Dietitian II Certified Diabetes Care and Education Specialist Please refer to [facility] for RD and/or RD on-call/weekend/after hours pager

## 2021-05-19 NOTE — Progress Notes (Addendum)
Family Medicine Teaching Service Daily Progress Note  Patient name: Caitlin Oliver Medical record number: [id number] Date of birth: 10-Mar-1941 Age: 81 y.o. Gender: female  Primary Care Provider: [name], PA-C Consultants: PCCM, neuro, ortho Code Status: DNR   Pt Overview and Major Events to Date:  1/27: Admitted to [facility] 1/28: Found to be hypotensive, tachycardic, encephalopathic, and febrile//Levophed started and admitted to ICU  1/29: On BiPAP in ICU, CT head-no acute abnormality 1/30: MRI brain: atrophy, chronic small vessel ischemic changes, acute punctate white matter infarct of L frontal lobe 1/30: On Precedex/Ativan/Haldol for agitation  1/31: Back to FPTS from ICU   Assessment and Plan:  Caitlin Oliver 81 year old female with multiple medical problems including type 2 diabetes, carotid artery stenosis status post bilateral endarterectomies, extensive PAD, pulmonary hypertension, and COPD.   Acute Metabolic Encephalopathy vs delirium, multifactorial (hypercarbia, benzo use at home, recent resolved shock) Neurological status this AM is appropriate which seems to be reassuring for hypercarbia causing encephalopathy. BiPAP overnight was tolerated per RT. Still appears that patient has been waxing and waning.  -Neurology consulted, appreciate recs  -As needed Dilaudid for pain 0.5 mg q2 -Continue BiPAP ON   Right Tib/Fib closed fracture 2/2 fall from standing  Placement of intramedullary nail pending awaiting medical optimization. Holding Plavix, allowing ASA per ortho recommendations. Ortho and neurology following.  -Dilaudid for pain as above  -Tylenol 692m Q6H PRN  -Neurology and ortho following, appreciate recs   Acute left frontal CVA  DAPT held initially--ASA restarted as surgical intervention is tentatively next week.  -ASA -Neurology following  - Holding Plavix   Chronic Hypoxemic Respiratory Failure (3L baseline oxygen) HFPEF(EF 65-70%) Pulm HTN  BiPAP  nightly given history of hypercarbia, monitor mentation and volume status while inpatient  -Supplemental O2 to maintain 92-97%  Constipation  Last documented stool was 1/27, strict I/Os. KUB showed moderate to large retained fecal material. Current bowel regimen is miraLAX and senna. Will assess for need of enema. Abdominal exam remains non-surgical which is reassuring. MiraLAX increased to 2 capfuls a day.  -Scheduled miraLAX and senna, increase in miraLAX -may need enema   AKI on CKD Stage IIIa>Stage IV (baseline Cr 1.5) Creatinine today Cr 1.79---->2.06, BUN 39, GFR 24. Will hold home torsemide for now and reassess with a close watch on volume status.   DM2 Diet controlled with CBGs, 175>182>193.   Decreased Oral Intake possible 2/2 to encephalopathy vs delirium  Nutritional consulted for lack of optimization of nutritional status while inpatient   Undifferentiated shock, resolved  SVT, resolved   FEN/GI: Heart Healthy PPx: Sub q heparin  Dispo: Pending continued medical work up and surgical intervention   Subjective:  Patient is doing well the morning and complains of pain in her stomach and leg. She had trouble sleeping but tolerated the BiPAP.   Objective: Temp:  [98.1 F (36.7 C)-98.6 F (37 C)] 98.6 F (37 C) (02/03 1100) Pulse Rate:  [51-71] 71 (02/03 1100) Resp:  [13-18] 16 (02/03 1100) BP: (108-139)/(53-70) 115/70 (02/03 1100) SpO2:  [94 %-100 %] 100 % (02/03 1134) FiO2 (%):  [35 %] 35 % (02/03 0748) Physical Exam: General: NAD, sitting up in bed with breathing treatment on  Cardiovascular: RRR, no m/r/g Respiratory: CTAB  Abdomen: Tender in RUQ, soft, nondistended  Extremities: Right leg wrapped ACE bandage and splint with good perfusion  Neuro: Alert and oriented x4 with no obvious neurological deficits. Good mentation.   Laboratory: Recent Labs  Lab 05/17/21 0231  05/18/21 0244 05/19/21 0230  WBC 8.5 8.2 8.4  HGB 9.1* 8.9* 9.1*  HCT 27.6* 28.0* 28.8*   PLT 187 196 234   Recent Labs  Lab 05/17/21 0231 05/18/21 0244 05/19/21 0230  NA 141 140 143  K 4.5 3.7 3.7  CL 103 95* 94*  CO2 31 36* 37*  BUN 21 20 25*  CREATININE 1.36* 1.50* 1.79*  CALCIUM 9.2 9.0 9.2  GLUCOSE 167* 170* 159*    DG Abd Portable 1V  Result Date: 05/19/2021 CLINICAL DATA:  Constipation EXAM: PORTABLE ABDOMEN - 1 VIEW COMPARISON:  Abdominal x-ray 05/14/2021 FINDINGS: Limited due to body habitus and portable technique. Nonobstructive bowel gas pattern. Moderate to large amount of retained fecal material in the colon. No suspicious calcifications identified. IMPRESSION: Moderate to large amount of retained fecal material. Electronically Signed   By: Ofilia Neas M.D.   On: 05/19/2021 12:19     Erskine Emery, MD 05/19/2021, 2:36 PM PGY-1, [place] Intern pager: [phone], text pages welcome

## 2021-05-19 NOTE — Progress Notes (Signed)
Home setting

## 2021-05-19 NOTE — Progress Notes (Signed)
Speech Language Pathology Treatment: Cognitive-Linquistic  Patient Details Name: Caitlin Oliver MRN: [id number] DOB: [birth year] Today's Date: 05/19/2021 Time: 1314-3888 SLP Time Calculation (min) (ACUTE ONLY): 17 min  Assessment / Plan / Recommendation Clinical Impression  Compared to assessment, her language has improved in conversation to occasional dysnomia. She was able to explain her thoughts and needs to therapist without significant clarification needed. Cognitive impairments evident for working memory, retrieval of information relevant to her fall and orientation to date and length of time in hospital. Pt given paper and pencil and encouraged her to make note of relevant info re: her disposition, things she hears from nursing or MD.    HPI HPI: 81 year old female with multiple medical problems admitted 1/27 with severe tib/fib fxs of RLE; planned IM fixation but declined medically with lethargy,  hypercarbic respiratory failure; placed on BiPAP and surgery was cancelled.  Current dx include shock, acute left frontal CVA, acute hypoxic respiratory failure.  PMHx significant for type 2 diabetes, carotid artery stenosis status post bilateral endarterectomies, extensive PAD, pulmonary hypertension, and COPD on 3 L of oxygen at home.  MRI with atrophy, chronic small vessel ischemic changes, acute punctate white matter infarct of L frontal lobe. Previous bedside swallow evaluation by SLP noted expressive aphasia with anomia, semantic and phonemic paraphasias, and awareness of deficits.      SLP Plan  Continue with current plan of care      Recommendations for follow up therapy are one component of a multi-disciplinary discharge planning process, led by the attending physician.  Recommendations may be updated based on patient status, additional functional criteria and insurance authorization.    Recommendations                   Oral Care Recommendations: Oral care BID Follow Up  Recommendations: Skilled nursing-short term rehab (<3 hours/day) Assistance recommended at discharge: Frequent or constant Supervision/Assistance SLP Visit Diagnosis: Cognitive communication deficit (R41.841);Aphasia (R47.01) Plan: Continue with current plan of care           Houston Siren  05/19/2021, 12:39 PM

## 2021-05-20 DIAGNOSIS — R410 Disorientation, unspecified: Secondary | ICD-10-CM | POA: Diagnosis not present

## 2021-05-20 DIAGNOSIS — I639 Cerebral infarction, unspecified: Secondary | ICD-10-CM | POA: Diagnosis not present

## 2021-05-20 DIAGNOSIS — J9621 Acute and chronic respiratory failure with hypoxia: Secondary | ICD-10-CM | POA: Diagnosis not present

## 2021-05-20 DIAGNOSIS — J9622 Acute and chronic respiratory failure with hypercapnia: Secondary | ICD-10-CM | POA: Diagnosis not present

## 2021-05-20 LAB — GLUCOSE, CAPILLARY
Glucose-Capillary: 193 mg/dL — ABNORMAL HIGH (ref 70–99)
Glucose-Capillary: 145 mg/dL — ABNORMAL HIGH (ref 70–99)
Glucose-Capillary: 309 mg/dL — ABNORMAL HIGH (ref 70–99)
Glucose-Capillary: 147 mg/dL — ABNORMAL HIGH (ref 70–99)
Glucose-Capillary: 170 mg/dL — ABNORMAL HIGH (ref 70–99)
Glucose-Capillary: 136 mg/dL — ABNORMAL HIGH (ref 70–99)

## 2021-05-20 LAB — BASIC METABOLIC PANEL
Anion gap: 11 (ref 5–15)
BUN: 39 mg/dL — ABNORMAL HIGH (ref 8–23)
CO2: 38 mmol/L — ABNORMAL HIGH (ref 22–32)
Calcium: 9.4 mg/dL (ref 8.9–10.3)
Chloride: 94 mmol/L — ABNORMAL LOW (ref 98–111)
Creatinine, Ser: 2.06 mg/dL — ABNORMAL HIGH (ref 0.44–1.00)
GFR, Estimated: 24 mL/min — ABNORMAL LOW (ref >60)
Glucose, Bld: 175 mg/dL — ABNORMAL HIGH (ref 70–99)
Potassium: 3.5 mmol/L (ref 3.5–5.1)
Sodium: 143 mmol/L (ref 135–145)

## 2021-05-20 LAB — CBC
HCT: 28.8 % — ABNORMAL LOW (ref 36.0–46.0)
Hemoglobin: 9.3 g/dL — ABNORMAL LOW (ref 12.0–15.0)
MCH: 30.7 pg (ref 26.0–34.0)
MCHC: 32.3 g/dL (ref 30.0–36.0)
MCV: 95 fL (ref 80.0–100.0)
Platelets: 283 10*3/uL (ref 150–400)
RBC: 3.03 MIL/uL — ABNORMAL LOW (ref 3.87–5.11)
RDW: 12.7 % (ref 11.5–15.5)
WBC: 9.3 10*3/uL (ref 4.0–10.5)
nRBC: 0.2 % (ref 0.0–0.2)

## 2021-05-20 MED ORDER — POLYETHYLENE GLYCOL 3350 17 G PO PACK
17.0000 g | PACK | Freq: Two times a day (BID) | ORAL | Status: DC
  Administered 2021-05-20 – 2021-05-25 (×7): 17 g via ORAL
  Filled 2021-05-20 (×8): qty 1

## 2021-05-20 NOTE — Progress Notes (Signed)
Pt refuses BIPAP for the night. RT informed pt to call if she changes her mind.

## 2021-05-20 NOTE — Plan of Care (Signed)

## 2021-05-20 NOTE — Progress Notes (Addendum)
Family Medicine Teaching Service Daily Progress Note Intern Pager: [phone]  Patient name: Caitlin Oliver Medical record number: [id number] Date of birth: 1941/04/01 Age: 81 y.o. Gender: female  Primary Care Provider: [name], PA-C Consultants: PCCM, neuro, ortho Code Status: DNR  Pt Overview and Major Events to Date:  1/27: Admitted to [facility] 1/28: Found to be hypotensive, tachycardic, encephalopathic, and febrile//Levophed started and admitted to ICU  1/29: On BiPAP in ICU, CT head-no acute abnormality 1/30: MRI brain: atrophy, chronic small vessel ischemic changes, acute punctate white matter infarct of L frontal lobe 1/30: On Precedex/Ativan/Haldol for agitation  1/31: Back to [facility]PTS from ICU   Assessment and Plan: Caitlin Oliver 81 year old female p/w fall 2/2 syncopal versus mechanical resulting in right tibia and fibula fractures. PMH type 2 diabetes, carotid artery stenosis status post bilateral endarterectomies, extensive PAD, pulmonary hypertension, and COPD.   Right Tib/Fib closed fracture 2/2 fall from standing Surgery scheduled for 2/6. Holding Plavix, continuing ASA per Ortho recs - Ortho following, appreciate recs - Tylenol 657m q6h prn - Dilaudid 0.557mq2 - NPO at midnight   Acute Metabolic Encephalopathy vs delirium, multifactorial (hypercarbia, benzo use at home, recent resolved shock), resolved  Alert and oriented this morning, seems back to baseline when I saw her initially on admission. Declined BiPAP overnight  - Neurology following, appreciate recs - Encourage consistent BiPAP use   Acute L frontal CVA Holding Plavix, continuing ASA per Ortho recs  Chronic diastolic HF (EF 6562-83%HR overnight ranged between 46-52 so held Metoprolol. Also holding Torsemide 2079m Continue Metoprolol once HR increases - Metolazone 5mg28mn. - Continue to monitor volume status   Chronic hypoxemic respiratory failure (on 3L O2 baseline) Pulm HTN Did have some blood  tinged sputum overnight when I saw her. Likely due to her pulm HTN.  - monitor mentation - monitor sputum - supplemental O2 to maintain 92-97% - encourage BiPAP nightly given hx of hypercarbia.  Constipation, improved  Had 2 Bms over the past 24 hours. Currently on Miralax BID, Senna daily, Colace daily - continue regimen above   AKI on CKD 3a, improving   Cr. 1.63 from 1.79 yesterday. Hard to determine baseline, was 0.87 in 2021, new baseline possibly closer to 1.3. Holding home Torsemide.  - monitor volume status - AM BMP  DM 2 AM CBG 175 Required 14 aspart   Decreased oral intake  Continue Ensure supplement, Glucerna shake, MVI   FEN/GI: Heart healthy  PPx: Heparin   Disposition: Pending surgery and continued work up   Subjective:   No acute events overnight. She reports having 2 bowel movements after the change in her bowel regimen. She did have some blood tinged sputum   Objective: Temp:  [97.2 F (36.2 C)-98 F (36.7 C)] 97.2 F (36.2 C) (02/04 2108) Pulse Rate:  [50-57] 50 (02/04 2108) Resp:  [14-20] 14 (02/04 2108) BP: (95-166)/(46-67) 115/46 (02/04 2108) SpO2:  [94 %-100 %] 100 % (02/04 2108) FiO2 (%):  [32 %] 32 % (02/04 08081517ysical Exam: General: alert, sitting in bed, NAD Cardiovascular: RRR no murmurs  Respiratory: CTAB normal WOB  Abdomen: soft, non distended  Extremities: warm, dry   Laboratory: Recent Labs  Lab 05/18/21 0244 05/19/21 0230 05/20/21 0447  WBC 8.2 8.4 9.3  HGB 8.9* 9.1* 9.3*  HCT 28.0* 28.8* 28.8*  PLT 196 234 283   Recent Labs  Lab 05/18/21 0244 05/19/21 0230 05/20/21 0447  NA 140 143 143  K 3.7 3.7 3.5  CL 95* 94* 94*  CO2 36* 37* 38*  BUN 20 25* 39*  CREATININE 1.50* 1.79* 2.06*  CALCIUM 9.0 9.2 9.4  GLUCOSE 170* 159* 175*     Imaging/Diagnostic Tests:  None new   Shary Key, DO 05/20/2021, 9:34 PM PGY-2, [place] Intern pager: [phone], text pages welcome   [place] PAGER  [phone] for any questions or notifications regarding this patient   FMTS Attending Daily Note: Dorcas Mcmurray MD  Attending [phone]  office [phone]  I  have seen and examined this patient, reviewed their chart. I have discussed this patient with the resident. I agree with the resident's findings, assessment and care plan.

## 2021-05-20 NOTE — Progress Notes (Signed)
RT was called to patient's room by RN stating she wants to wear her BIPAP. When I arrived to patient room patient states she "seems to be breathing fine tonight without it but I guess I will try it for at least a couple of hours".

## 2021-05-20 NOTE — Progress Notes (Signed)
FPTS Brief Progress Note  S: Patient resting in bed comfortably bed   O: BP 111/77 (BP Location: Right Arm)    Pulse 62    Temp 97.8 F (36.6 C) (Oral)    Resp 18    Ht _0  (1.626 m)    Wt 79.3 kg    SpO2 99%    BMI 30.01 kg/m   GEN: NAD, laying in bed comfortably  A/P: Plan per day team note - Orders reviewed. Labs for AM ordered, which was adjusted as needed.   Gerrit Heck, MD 05/20/2021, 1:22 AM PGY-1, [facility] Family Medicine Night Resident  Please page [phone] with questions.

## 2021-05-20 NOTE — Plan of Care (Signed)

## 2021-05-20 NOTE — Progress Notes (Signed)
RT got call from RN concerning removal of Bi-PAP. RT came to room and asked pt is she was ready to be placed back on and she, laughing, said no. She complained about not being able to rest while on the machine. Per pt request pt is now sleeping with [place]. No signs of distress and vitals are stable. RT will continue to monitor as needed.

## 2021-05-21 DIAGNOSIS — I639 Cerebral infarction, unspecified: Secondary | ICD-10-CM | POA: Diagnosis not present

## 2021-05-21 DIAGNOSIS — S82201G Unspecified fracture of shaft of right tibia, subsequent encounter for closed fracture with delayed healing: Secondary | ICD-10-CM

## 2021-05-21 DIAGNOSIS — J9622 Acute and chronic respiratory failure with hypercapnia: Secondary | ICD-10-CM | POA: Diagnosis not present

## 2021-05-21 DIAGNOSIS — S82401G Unspecified fracture of shaft of right fibula, subsequent encounter for closed fracture with delayed healing: Secondary | ICD-10-CM

## 2021-05-21 DIAGNOSIS — J9621 Acute and chronic respiratory failure with hypoxia: Secondary | ICD-10-CM | POA: Diagnosis not present

## 2021-05-21 LAB — CBC
HCT: 27.9 % — ABNORMAL LOW (ref 36.0–46.0)
Hemoglobin: 8.8 g/dL — ABNORMAL LOW (ref 12.0–15.0)
MCH: 30.1 pg (ref 26.0–34.0)
MCHC: 31.5 g/dL (ref 30.0–36.0)
MCV: 95.5 fL (ref 80.0–100.0)
Platelets: 290 10*3/uL (ref 150–400)
RBC: 2.92 MIL/uL — ABNORMAL LOW (ref 3.87–5.11)
RDW: 12.8 % (ref 11.5–15.5)
WBC: 8.8 10*3/uL (ref 4.0–10.5)
nRBC: 0.2 % (ref 0.0–0.2)

## 2021-05-21 LAB — GLUCOSE, CAPILLARY
Glucose-Capillary: 175 mg/dL — ABNORMAL HIGH (ref 70–99)
Glucose-Capillary: 129 mg/dL — ABNORMAL HIGH (ref 70–99)
Glucose-Capillary: 185 mg/dL — ABNORMAL HIGH (ref 70–99)
Glucose-Capillary: 155 mg/dL — ABNORMAL HIGH (ref 70–99)
Glucose-Capillary: 131 mg/dL — ABNORMAL HIGH (ref 70–99)

## 2021-05-21 LAB — BASIC METABOLIC PANEL
Anion gap: 10 (ref 5–15)
BUN: 31 mg/dL — ABNORMAL HIGH (ref 8–23)
CO2: 35 mmol/L — ABNORMAL HIGH (ref 22–32)
Calcium: 9 mg/dL (ref 8.9–10.3)
Chloride: 93 mmol/L — ABNORMAL LOW (ref 98–111)
Creatinine, Ser: 1.63 mg/dL — ABNORMAL HIGH (ref 0.44–1.00)
GFR, Estimated: 32 mL/min — ABNORMAL LOW (ref >60)
Glucose, Bld: 183 mg/dL — ABNORMAL HIGH (ref 70–99)
Potassium: 3.7 mmol/L (ref 3.5–5.1)
Sodium: 138 mmol/L (ref 135–145)

## 2021-05-21 MED ORDER — METOPROLOL TARTRATE 25 MG PO TABS
25.0000 mg | ORAL_TABLET | Freq: Two times a day (BID) | ORAL | Status: DC
  Administered 2021-05-22: 25 mg via ORAL
  Filled 2021-05-21: qty 1

## 2021-05-21 NOTE — Plan of Care (Signed)

## 2021-05-21 NOTE — Progress Notes (Signed)
Patient had a small amount of blood tinged phlegm. DO made aware. Will continue to monitor patient.

## 2021-05-21 NOTE — Anesthesia Preprocedure Evaluation (Addendum)
Anesthesia Evaluation  Patient identified by MRN, date of birth, ID band Patient awake    Reviewed: Allergy & Precautions, NPO status , Patient's Chart, lab work & pertinent test results, reviewed documented beta blocker date and time   History of Anesthesia Complications Negative for: history of anesthetic complications  Airway Mallampati: II  TM Distance: >3 FB Neck ROM: Full    Dental  (+) Dental Advisory Given, Missing   Pulmonary COPD,  oxygen dependent, former smoker,   Lung cancer    Pulmonary exam normal breath sounds clear to auscultation       Cardiovascular hypertension, Pt. on medications and Pt. on home beta blockers pulmonary hypertension+ Peripheral Vascular Disease and +CHF  Normal cardiovascular exam Rhythm:Regular Rate:Normal   Subclavian steal syndrome  '23 TTE - EF 65 to 70%. Grade I diastolic dysfunction (impaired relaxation). Severely elevated pulmonary artery systolic pressure. The estimated right ventricular systolic pressure is 97.2 mmHg. The pericardial effusion is anterior to the right ventricle. Moderate pleural effusion in the left lateral region. There is borderline dilatation of the ascending aorta, measuring 39 mm.     Neuro/Psych  Headaches, PSYCHIATRIC DISORDERS Anxiety CVA    GI/Hepatic Neg liver ROS, PUD, GERD  Medicated,  Endo/Other  diabetes, Type 2, Oral Hypoglycemic Agents Obesity   Renal/GU CRFRenal disease     Musculoskeletal  (+) Arthritis ,   Abdominal   Peds  Hematology  On plavix    Anesthesia Other Findings   Reproductive/Obstetrics                            Anesthesia Physical Anesthesia Plan  ASA: 4  Anesthesia Plan: General   Post-op Pain Management: Tylenol PO (pre-op)   Induction: Intravenous  PONV Risk Score and Plan: 3 and Treatment may vary due to age or medical condition, Ondansetron and Dexamethasone  Airway  Management Planned: Oral ETT  Additional Equipment: None  Intra-op Plan:   Post-operative Plan: Extubation in OR  Informed Consent: I have reviewed the patients History and Physical, chart, labs and discussed the procedure including the risks, benefits and alternatives for the proposed anesthesia with the patient or authorized representative who has indicated his/her understanding and acceptance.   Patient has DNR.  Discussed DNR with patient and Continue DNR.   Dental advisory given  Plan Discussed with: CRNA and Anesthesiologist  Anesthesia Plan Comments:        Anesthesia Quick Evaluation

## 2021-05-21 NOTE — Progress Notes (Signed)
Family Medicine Teaching Service Daily Progress Note Intern Pager: [phone]  Patient name: Caitlin Oliver Medical record number: [id number] Date of birth: 1940-07-29 Age: 81 y.o. Gender: female  Primary Care Provider: [name], PA-C Consultants: PCCM, neuro, Ortho Code Status: DNR  Pt Overview and Major Events to Date:  1/27 admitted to [facility] 1/28 found to be hypotensive, tachycardic, encephalopathic and febrile, Levophed started and admitted to ICU 1/29 on BiPAP in ICU CT head no acute abnormalities 1/30 MRI brain showed atrophy, chronic small vessel ischemic changes, acute punctate white matter infarct of left frontal lobe 1/30 on Precedex/Ativan/Haldol for agitation 1/31 Dr. Docia Barrier from ICU  Assessment and Plan:  Caitlin Oliver is an 81 year old female who presented with a fall secondary to syncopal versus mechanical fall resulting in right tibia and fibula closed fracture.  Past medical history significant for type 2 diabetes, carotid artery stenosis, status post bilateral endarterectomies, extensive PAD, pulmonary hypertension and COPD.  Right tib-fib closed fracture secondary to fall from standing Surgery scheduled for tomorrow 2/7 due to OR availability.  Hold Plavix and continueaspirin per orthopedic recommendations. -Ortho following, appreciate care recommendations -Lasix 50 mg every 6 hours as needed -Dilaudid 0.5 mg every 2 hours as needed -NPO at midnight  Acute metabolic encephalopathy versus delirium, multifactorial, resolved (hypercarbia, benzo use at home, recently with resolved shock) Patient is alert and oriented x4.  Did use BiPAP overnight bedside the machine was not working well and ended up only being on it for short time. -Neurology following, appreciate recommendations -Continue encouraging consistent BiPAP use  Acute left frontal CVA -Holding Plavix and continuing aspirin per orthopedic recommendations. -Restart Plavix as appropriate  Chronic  hypoxemic respiratory failure on 3 L O2 baseline On 3 L nasal cannula, no increased work of breathing. -Monitor sputum -Oxygen as needed to maintain above 92% -BiPAP at night  Chronic diastolic heart failure (EF 65 to 70%)   severe PAH  Heart rates last night were 40s and 50s.  Does not appear edematous on examination. -metoprolol 25 mg BID reduce to once daily  -Metolazone 5 mg.   -hold home torsemide  Constipation Said she had a bowel movement yesterday.  Mildly tender to palpation, soft. -MiraLAX BID -Senna BID -Colace daily  T2DM CBGs have been 126-193 required. 8 units short-acting. -CBG monitoring -sSSI  CKD 3 A Creatinine today is 1.4 from 1.63. -Monitor  Decreased PO Intake -Ensure -Glucerna -MVI  FEN/GI: Heart healthy PPx: Heparin Dispo: pending surgery/medical work up  Subjective:  Patient was upset about surgery being postponed until tomorrow.  Also says that she feels slightly nauseous this morning.  Objective: Temp:  [97.2 F (36.2 C)-97.9 F (36.6 C)] 97.9 F (36.6 C) (02/05 1446) Pulse Rate:  [47-58] 58 (02/05 1446) Resp:  [14-20] 20 (02/05 1446) BP: (113-122)/(46-71) 115/52 (02/05 1446) SpO2:  [99 %-100 %] 100 % (02/05 0757) FiO2 (%):  [32 %] 32 % (02/05 0757) Physical Exam: General: NAD, laying in bed comfortably, alert and oriented x4 Cardiovascular: RRR no murmurs rubs or gallops Respiratory: Clear to auscultation bilaterally no wheezes rales or crackles Abdomen: Nondistended, mildly tender to palpation, soft Extremities: Right lower extremity bandaged with Ace wrap, 2+ cap refill  Laboratory: Recent Labs  Lab 05/19/21 0230 05/20/21 0447 05/21/21 0248  WBC 8.4 9.3 8.8  HGB 9.1* 9.3* 8.8*  HCT 28.8* 28.8* 27.9*  PLT 234 283 290   Recent Labs  Lab 05/19/21 0230 05/20/21 0447 05/21/21 0248  NA 143 143 138  K  3.7 3.5 3.7  CL 94* 94* 93*  CO2 37* 38* 35*  BUN 25* 39* 31*  CREATININE 1.79* 2.06* 1.63*  CALCIUM 9.2 9.4 9.0   GLUCOSE 159* 175* 183*      Imaging/Diagnostic Tests:   Gerrit Heck, MD 05/21/2021, 8:28 PM PGY-1, [place] Intern pager: [phone], text pages welcome

## 2021-05-21 NOTE — Progress Notes (Signed)
FPTS Brief Progress Note  S:Patient awake when I came into the room. She had a little bit of blood tinged phlegm bedside. She reports feeling fine, and was not in any stated pain.    O: BP 116/62    Pulse (!) 47    Temp 97.9 F (36.6 C)    Resp 20    Ht _0  (1.626 m)    Wt 79.3 kg    SpO2 99%    BMI 30.01 kg/m    General: laying in bed, NAD Resp: CTAB normal WOB  CV: bradycardic, no murmurs   A/P:  Right Tib/Fib closed fracture 2/2 fall from standing Surgery scheduled for 2/6. Holding Plavix, continuing ASA per Ortho recs - Ortho following, appreciate recs - Tylenol 63m q6h prn - Dilaudid 0.534mq2  Chronic hypoxemic respiratory failure (on 3L O2 baseline)  Pulm HTN Patient with blood tinged sputum. She feels well. Did not have bipap on when I was in the room though nurse mentioned she had it on briefly - continue to monitor    - Orders reviewed. Labs for AM ordered, which was adjusted as needed.    PaShary KeyDO 05/21/2021, 1:48 AM PGY-2, [place] Family Medicine Night Resident  Please page [phone] questions.

## 2021-05-21 NOTE — Progress Notes (Signed)
Unfortunately limited surgeon/ OR availability and higher acuity cases have created the need to move Caitlin Oliver's tibia repair to first case on Tues am rather than tomorrow.  [place], MD [facility], [facility] [phone]

## 2021-05-22 DIAGNOSIS — S82201G Unspecified fracture of shaft of right tibia, subsequent encounter for closed fracture with delayed healing: Secondary | ICD-10-CM | POA: Diagnosis not present

## 2021-05-22 DIAGNOSIS — E1169 Type 2 diabetes mellitus with other specified complication: Secondary | ICD-10-CM | POA: Diagnosis not present

## 2021-05-22 DIAGNOSIS — J9621 Acute and chronic respiratory failure with hypoxia: Secondary | ICD-10-CM | POA: Diagnosis not present

## 2021-05-22 DIAGNOSIS — I639 Cerebral infarction, unspecified: Secondary | ICD-10-CM | POA: Diagnosis not present

## 2021-05-22 LAB — BASIC METABOLIC PANEL
Anion gap: 7 (ref 5–15)
BUN: 22 mg/dL (ref 8–23)
CO2: 38 mmol/L — ABNORMAL HIGH (ref 22–32)
Calcium: 9 mg/dL (ref 8.9–10.3)
Chloride: 92 mmol/L — ABNORMAL LOW (ref 98–111)
Creatinine, Ser: 1.4 mg/dL — ABNORMAL HIGH (ref 0.44–1.00)
GFR, Estimated: 38 mL/min — ABNORMAL LOW (ref >60)
Glucose, Bld: 138 mg/dL — ABNORMAL HIGH (ref 70–99)
Potassium: 4.2 mmol/L (ref 3.5–5.1)
Sodium: 137 mmol/L (ref 135–145)

## 2021-05-22 LAB — CBC
HCT: 28.1 % — ABNORMAL LOW (ref 36.0–46.0)
Hemoglobin: 9.1 g/dL — ABNORMAL LOW (ref 12.0–15.0)
MCH: 30.6 pg (ref 26.0–34.0)
MCHC: 32.4 g/dL (ref 30.0–36.0)
MCV: 94.6 fL (ref 80.0–100.0)
Platelets: 321 10*3/uL (ref 150–400)
RBC: 2.97 MIL/uL — ABNORMAL LOW (ref 3.87–5.11)
RDW: 13 % (ref 11.5–15.5)
WBC: 7.8 10*3/uL (ref 4.0–10.5)
nRBC: 0 % (ref 0.0–0.2)

## 2021-05-22 LAB — GLUCOSE, CAPILLARY
Glucose-Capillary: 126 mg/dL — ABNORMAL HIGH (ref 70–99)
Glucose-Capillary: 134 mg/dL — ABNORMAL HIGH (ref 70–99)
Glucose-Capillary: 137 mg/dL — ABNORMAL HIGH (ref 70–99)
Glucose-Capillary: 142 mg/dL — ABNORMAL HIGH (ref 70–99)
Glucose-Capillary: 150 mg/dL — ABNORMAL HIGH (ref 70–99)
Glucose-Capillary: 193 mg/dL — ABNORMAL HIGH (ref 70–99)
Glucose-Capillary: 200 mg/dL — ABNORMAL HIGH (ref 70–99)

## 2021-05-22 MED ORDER — METOPROLOL SUCCINATE ER 25 MG PO TB24: 25.0000 mg | ORAL_TABLET | Freq: Every day | ORAL | Status: DC

## 2021-05-22 NOTE — Progress Notes (Signed)
FPTS Brief Progress Note  S: Patient is doing well tonight. She reports that the RT was supposed to come place her BiPAP, is not currently wearing it.    O: BP 117/83 (BP Location: Left Arm)    Pulse 71    Temp 97.8 F (36.6 C)    Resp 18    Ht _0  (1.626 m)    Wt 79.3 kg    SpO2 98%    BMI 30.01 kg/m   General: NAD, pleasant, able to participate in exam Respiratory: No respiratory distress CV: RRR Skin: warm and dry, no rashes noted, slight oozing from RLQ of abdomen where Heparin injections are given  Psych: Normal affect and mood   A/P: -Bradycardia: Patient has had nocturnal bradycardia, has gradually had improvement with decreasing Metoprolol dosage>currently on 25 mg once daily. HR now in the 50s (not under vital signs, in the comprehensive flowsheet). We will continue to monitor intermittently throughout the night. She remains in NSR on rhythm strip and is currently asymptomatic, has received EKG without concerning findings yesterday.   -NPO at MN for surgery   -RT contacted for bipap use ON  -Spoke to nursing about heparin injection sites. No other signs of acute bleeding, having nursing switch to the left side of the abdomen.   Erskine Emery, MD 05/22/2021, 10:21 PM PGY-1, [place] Medicine Night Resident  Please page [phone] with questions.

## 2021-05-22 NOTE — Progress Notes (Signed)
Phone number confirmed.  Left general VM for niece [name] stating that pt's surgery had been postponed until Tuesday due to the MD's schedule.

## 2021-05-22 NOTE — Plan of Care (Signed)

## 2021-05-22 NOTE — Progress Notes (Signed)
FPTS Brief Progress Note  S: Pt is sleeping, I did not wake the patient.    O: BP (!) 128/53 (BP Location: Left Arm)    Pulse (!) 51    Temp (!) 97.5 F (36.4 C) (Oral)    Resp 16    Ht _0  (1.626 m)    Wt 79.3 kg    SpO2 99%    BMI 30.01 kg/m   General: Sleeping in bed  Respiratory: No respiratory distress    A/P: Patient does not have BiPAP on currently, asked nursing to speak with RT about getting bipap on. She agreed to talk to RT.  - Orders reviewed. Labs for AM ordered, which was adjusted as needed.   Erskine Emery, MD 05/22/2021, 1:21 AM PGY-1, [facility] Family Medicine Night Resident  Please page [phone] with questions.

## 2021-05-22 NOTE — Progress Notes (Signed)
Family Medicine Teaching Service Daily Progress Note Intern Pager: [phone]  Patient name: Caitlin Oliver Medical record number: [id number] Date of birth: 1940/11/30 Age: 81 y.o. Gender: female  Primary Care Provider: [name], PA-C Consultants: PCCM, Neuro, Ortho Code Status: DNR  Pt Overview and Major Events to Date:  1/27 admitted to [facility] 1/28 found to be hypotensive, tachycardic, encephalopathic and febrile, Levophed started and admitted to ICU 1/29 on BiPAP in ICU CT head no acute abnormalities 1/30 MRI brain showed atrophy, chronic small vessel ischemic changes, acute punctate white matter infarct of left frontal lobe 1/30 on Precedex/Ativan/Haldol for agitation 1/31 Transfer to [place] from ICU    Assessment and Plan:  Caitlin Oliver is an 81 year old female who presented with a fall secondary to syncopal versus mechanical fall resulting in right tibia and fibula closed fracture.  Past medical history significant for type 2 diabetes, carotid artery stenosis, status post bilateral endarterectomies, extensive PAD, pulmonary hypertension and COPD.  Right tib-fib closed fracture Surgery scheduled for today 2/7. Have been holding plavix and continuing aspirin per orthopedic recs. -Ortho following, appreciate care and recs -dilaudid 0.5 mg q2h prn -tylenol 650 q6h scheduled  Acute metabolic encephalopathy v delirium, multifactorial resolved  Patient today is A&Ox4. No BiPAP. -Encourage consistent BiPAP use -Monitor mental status  Abdominal pain Soft, is tender to palpation. Some bruising on abdomen where heparin administered. Strikethrough present on bandage. -media tab  -switch from heparin after surgery -consider imaging -monitor  Acute Left Frontal CVA -Holding plavix for surgery -continue aspirin  Chronic Hypoxemic Respiratory Failure on 3 L O2 at Baseline No iWOB currently on 3L. -keep 02 88-92% -BiPAP overnight  Chronic diastolic heart failure EF 65-70%  Severe PAH HR was 50s to 90s yesterday. On examination appeared euvolemic -metoprolol 12.5 mg  -metolazone 5 mg daily -hold home torsemide  Constipation Today patient abdomen is soft, tender to palpation. -Miralax BID -Senna BID -Colace daily  T2DM CBGs have been 134-200. Received 9 units short acting -CBG monitoring -sSSI  CKD 3a Cr today is 1.18 from 1.4 yesterday -monitor  Decreased PO intake -ensure -glucerna -MVI  FEN/GI: Heart Healthy PPx: Heparin Dispo:Pending surgery/medical work up  Subjective:  No acute issues today. No active bleeding noted, bandage in place on abdomen.   Objective: Temp:  [97.7 F (36.5 C)-98.3 F (36.8 C)] 97.8 F (36.6 C) (02/07 0830) Pulse Rate:  [51-98] 66 (02/07 1212) Resp:  [16-19] 18 (02/07 0423) BP: (119-174)/(49-76) 137/61 (02/07 1200) SpO2:  [98 %-100 %] 98 % (02/07 0830) Physical Exam: General: NAD, laying in bed comfortably Cardiovascular: RRR no m/r/g Respiratory: CTAB no w/rc Abdomen: brusing near heparin injections on lower abdomen, no active bleeding, strikethrough on bandaid Extremities: no LE edema, R leg wrapped in ace  Laboratory: Recent Labs  Lab 05/21/21 0248 05/22/21 0348 05/23/21 0429  WBC 8.8 7.8 7.4  HGB 8.8* 9.1* 9.7*  HCT 27.9* 28.1* 29.1*  PLT 290 321 334   Recent Labs  Lab 05/21/21 0248 05/22/21 0348 05/23/21 0429  NA 138 137 141  K 3.7 4.2 4.0  CL 93* 92* 97*  CO2 35* 38* 37*  BUN 31* 22 16  CREATININE 1.63* 1.40* 1.18*  CALCIUM 9.0 9.0 9.4  GLUCOSE 183* 138* 158*      Imaging/Diagnostic Tests: No results found.   Gerrit Heck, MD 05/23/2021, 12:44 PM PGY-1, [place] Intern pager: [phone], text pages welcome

## 2021-05-23 ENCOUNTER — Other Ambulatory Visit: Payer: Self-pay

## 2021-05-23 ENCOUNTER — Inpatient Hospital Stay (HOSPITAL_COMMUNITY): Payer: PPO

## 2021-05-23 ENCOUNTER — Inpatient Hospital Stay (HOSPITAL_COMMUNITY): Payer: PPO | Admitting: Anesthesiology

## 2021-05-23 ENCOUNTER — Encounter (HOSPITAL_COMMUNITY)
Admission: EM | Disposition: A | Discharge status: Skilled Nursing Facility | Payer: Self-pay | Source: Home / Self Care | Attending: Family Medicine

## 2021-05-23 ENCOUNTER — Encounter (HOSPITAL_COMMUNITY): Payer: Self-pay | Admitting: Family Medicine

## 2021-05-23 DIAGNOSIS — S82201G Unspecified fracture of shaft of right tibia, subsequent encounter for closed fracture with delayed healing: Secondary | ICD-10-CM | POA: Diagnosis not present

## 2021-05-23 DIAGNOSIS — I639 Cerebral infarction, unspecified: Secondary | ICD-10-CM | POA: Diagnosis not present

## 2021-05-23 DIAGNOSIS — S82401G Unspecified fracture of shaft of right fibula, subsequent encounter for closed fracture with delayed healing: Secondary | ICD-10-CM | POA: Diagnosis not present

## 2021-05-23 HISTORY — PX: TIBIA IM NAIL INSERTION: SHX2516

## 2021-05-23 LAB — BASIC METABOLIC PANEL
Anion gap: 7 (ref 5–15)
Anion gap: 9 (ref 5–15)
BUN: 16 mg/dL (ref 8–23)
BUN: 14 mg/dL (ref 8–23)
CO2: 37 mmol/L — ABNORMAL HIGH (ref 22–32)
CO2: 32 mmol/L (ref 22–32)
Calcium: 9.4 mg/dL (ref 8.9–10.3)
Calcium: 9.3 mg/dL (ref 8.9–10.3)
Chloride: 97 mmol/L — ABNORMAL LOW (ref 98–111)
Chloride: 99 mmol/L (ref 98–111)
Creatinine, Ser: 1.18 mg/dL — ABNORMAL HIGH (ref 0.44–1.00)
Creatinine, Ser: 1.17 mg/dL — ABNORMAL HIGH (ref 0.44–1.00)
GFR, Estimated: 47 mL/min — ABNORMAL LOW (ref >60)
GFR, Estimated: 47 mL/min — ABNORMAL LOW (ref >60)
Glucose, Bld: 158 mg/dL — ABNORMAL HIGH (ref 70–99)
Glucose, Bld: 198 mg/dL — ABNORMAL HIGH (ref 70–99)
Potassium: 4 mmol/L (ref 3.5–5.1)
Potassium: 4.5 mmol/L (ref 3.5–5.1)
Sodium: 141 mmol/L (ref 135–145)
Sodium: 140 mmol/L (ref 135–145)

## 2021-05-23 LAB — GLUCOSE, CAPILLARY
Glucose-Capillary: 153 mg/dL — ABNORMAL HIGH (ref 70–99)
Glucose-Capillary: 161 mg/dL — ABNORMAL HIGH (ref 70–99)
Glucose-Capillary: 147 mg/dL — ABNORMAL HIGH (ref 70–99)
Glucose-Capillary: 149 mg/dL — ABNORMAL HIGH (ref 70–99)
Glucose-Capillary: 143 mg/dL — ABNORMAL HIGH (ref 70–99)
Glucose-Capillary: 289 mg/dL — ABNORMAL HIGH (ref 70–99)

## 2021-05-23 LAB — CBC
HCT: 29.1 % — ABNORMAL LOW (ref 36.0–46.0)
HCT: 30.5 % — ABNORMAL LOW (ref 36.0–46.0)
Hemoglobin: 9.7 g/dL — ABNORMAL LOW (ref 12.0–15.0)
Hemoglobin: 10 g/dL — ABNORMAL LOW (ref 12.0–15.0)
MCH: 31.2 pg (ref 26.0–34.0)
MCH: 31.3 pg (ref 26.0–34.0)
MCHC: 33.3 g/dL (ref 30.0–36.0)
MCHC: 32.8 g/dL (ref 30.0–36.0)
MCV: 93.6 fL (ref 80.0–100.0)
MCV: 95.3 fL (ref 80.0–100.0)
Platelets: 334 10*3/uL (ref 150–400)
Platelets: 371 10*3/uL (ref 150–400)
RBC: 3.11 MIL/uL — ABNORMAL LOW (ref 3.87–5.11)
RBC: 3.2 MIL/uL — ABNORMAL LOW (ref 3.87–5.11)
RDW: 13.3 % (ref 11.5–15.5)
RDW: 13.4 % (ref 11.5–15.5)
WBC: 7.4 10*3/uL (ref 4.0–10.5)
WBC: 12.2 10*3/uL — ABNORMAL HIGH (ref 4.0–10.5)
nRBC: 0 % (ref 0.0–0.2)
nRBC: 0 % (ref 0.0–0.2)

## 2021-05-23 LAB — MAGNESIUM: Magnesium: 2.3 mg/dL (ref 1.7–2.4)

## 2021-05-23 LAB — PHOSPHORUS: Phosphorus: 4 mg/dL (ref 2.5–4.6)

## 2021-05-23 LAB — BRAIN NATRIURETIC PEPTIDE: B Natriuretic Peptide: 194.4 pg/mL — ABNORMAL HIGH (ref 0.0–100.0)

## 2021-05-23 SURGERY — INSERTION, INTRAMEDULLARY ROD, TIBIA
Anesthesia: General | Laterality: Right

## 2021-05-23 MED ORDER — LIDOCAINE 2% (20 MG/ML) 5 ML SYRINGE
INTRAMUSCULAR | Status: AC
  Filled 2021-05-23: qty 5

## 2021-05-23 MED ORDER — PROPOFOL 10 MG/ML IV BOLUS
INTRAVENOUS | Status: DC | PRN
  Administered 2021-05-23: 100 mg via INTRAVENOUS

## 2021-05-23 MED ORDER — ZINC SULFATE 220 (50 ZN) MG PO CAPS
220.0000 mg | ORAL_CAPSULE | Freq: Every day | ORAL | Status: DC
  Administered 2021-05-23 – 2021-05-27 (×5): 220 mg via ORAL
  Filled 2021-05-23 (×5): qty 1

## 2021-05-23 MED ORDER — CHLORHEXIDINE GLUCONATE 4 % EX LIQD
60.0000 mL | Freq: Once | CUTANEOUS | Status: DC
Start: 2021-05-23 — End: 2021-05-23

## 2021-05-23 MED ORDER — 0.9 % SODIUM CHLORIDE (POUR BTL) OPTIME
TOPICAL | Status: DC | PRN
  Administered 2021-05-23: 1000 mL

## 2021-05-23 MED ORDER — LACTATED RINGERS IV SOLN: INTRAVENOUS | Status: DC

## 2021-05-23 MED ORDER — PROPOFOL 10 MG/ML IV BOLUS
INTRAVENOUS | Status: AC
  Filled 2021-05-23: qty 20

## 2021-05-23 MED ORDER — METOPROLOL SUCCINATE ER 25 MG PO TB24
12.5000 mg | ORAL_TABLET | Freq: Every day | ORAL | Status: DC
  Administered 2021-05-23: 12.5 mg via ORAL
  Filled 2021-05-23: qty 1

## 2021-05-23 MED ORDER — CEFAZOLIN SODIUM-DEXTROSE 2-4 GM/100ML-% IV SOLN
INTRAVENOUS | Status: AC
  Filled 2021-05-23: qty 100

## 2021-05-23 MED ORDER — ROCURONIUM BROMIDE 10 MG/ML (PF) SYRINGE
PREFILLED_SYRINGE | INTRAVENOUS | Status: DC | PRN
  Administered 2021-05-23: 50 mg via INTRAVENOUS

## 2021-05-23 MED ORDER — ONDANSETRON HCL 4 MG/2ML IJ SOLN: 4.0000 mg | Freq: Once | INTRAMUSCULAR | Status: DC | PRN

## 2021-05-23 MED ORDER — OXYCODONE HCL 5 MG PO TABS: 5.0000 mg | ORAL_TABLET | Freq: Once | ORAL | Status: DC | PRN

## 2021-05-23 MED ORDER — INSULIN ASPART 100 UNIT/ML IJ SOLN
0.0000 [IU] | Freq: Three times a day (TID) | INTRAMUSCULAR | Status: DC
  Administered 2021-05-24: 3 [IU] via SUBCUTANEOUS
  Administered 2021-05-24: 2 [IU] via SUBCUTANEOUS
  Administered 2021-05-24 – 2021-05-25 (×2): 3 [IU] via SUBCUTANEOUS
  Administered 2021-05-25 (×2): 2 [IU] via SUBCUTANEOUS
  Administered 2021-05-26 – 2021-05-27 (×4): 3 [IU] via SUBCUTANEOUS
  Administered 2021-05-27: 5 [IU] via SUBCUTANEOUS

## 2021-05-23 MED ORDER — ASCORBIC ACID 500 MG PO TABS
1000.0000 mg | ORAL_TABLET | Freq: Every day | ORAL | Status: DC
  Administered 2021-05-23 – 2021-05-27 (×5): 1000 mg via ORAL
  Filled 2021-05-23 (×5): qty 2

## 2021-05-23 MED ORDER — ROCURONIUM BROMIDE 10 MG/ML (PF) SYRINGE
PREFILLED_SYRINGE | INTRAVENOUS | Status: AC
  Filled 2021-05-23: qty 10

## 2021-05-23 MED ORDER — FENTANYL CITRATE (PF) 250 MCG/5ML IJ SOLN
INTRAMUSCULAR | Status: AC
  Filled 2021-05-23: qty 5

## 2021-05-23 MED ORDER — LACTATED RINGERS IV SOLN: INTRAVENOUS | Status: DC | PRN

## 2021-05-23 MED ORDER — OXYCODONE HCL 5 MG/5ML PO SOLN: 5.0000 mg | Freq: Once | ORAL | Status: DC | PRN

## 2021-05-23 MED ORDER — ALBUTEROL SULFATE (2.5 MG/3ML) 0.083% IN NEBU: 2.5000 mg | INHALATION_SOLUTION | Freq: Four times a day (QID) | RESPIRATORY_TRACT | Status: DC | PRN

## 2021-05-23 MED ORDER — SUGAMMADEX SODIUM 200 MG/2ML IV SOLN
INTRAVENOUS | Status: DC | PRN
  Administered 2021-05-23: 200 mg via INTRAVENOUS

## 2021-05-23 MED ORDER — ONDANSETRON HCL 4 MG/2ML IJ SOLN
INTRAMUSCULAR | Status: DC | PRN
Start: 2021-05-23 — End: 2021-05-23
  Administered 2021-05-23: 4 mg via INTRAVENOUS

## 2021-05-23 MED ORDER — CEFAZOLIN SODIUM-DEXTROSE 2-4 GM/100ML-% IV SOLN
2.0000 g | INTRAVENOUS | Status: AC
  Administered 2021-05-23: 2 g via INTRAVENOUS

## 2021-05-23 MED ORDER — EPHEDRINE 5 MG/ML INJ
INTRAVENOUS | Status: AC
  Filled 2021-05-23: qty 5

## 2021-05-23 MED ORDER — DEXMEDETOMIDINE (PRECEDEX) IN NS 20 MCG/5ML (4 MCG/ML) IV SYRINGE
PREFILLED_SYRINGE | INTRAVENOUS | Status: DC | PRN
  Administered 2021-05-23: 20 ug via INTRAVENOUS

## 2021-05-23 MED ORDER — CEFAZOLIN SODIUM-DEXTROSE 2-4 GM/100ML-% IV SOLN
2.0000 g | Freq: Three times a day (TID) | INTRAVENOUS | Status: AC
  Administered 2021-05-23 – 2021-05-24 (×3): 2 g via INTRAVENOUS
  Filled 2021-05-23 (×3): qty 100

## 2021-05-23 MED ORDER — ACETAMINOPHEN 500 MG PO TABS
ORAL_TABLET | ORAL | Status: AC
  Administered 2021-05-23: 1000 mg via ORAL
  Filled 2021-05-23: qty 2

## 2021-05-23 MED ORDER — ONDANSETRON HCL 4 MG/2ML IJ SOLN
INTRAMUSCULAR | Status: AC
  Filled 2021-05-23: qty 2

## 2021-05-23 MED ORDER — LIDOCAINE 2% (20 MG/ML) 5 ML SYRINGE
INTRAMUSCULAR | Status: DC | PRN
  Administered 2021-05-23: 60 mg via INTRAVENOUS

## 2021-05-23 MED ORDER — POVIDONE-IODINE 10 % EX SWAB
2.0000 "application " | Freq: Once | CUTANEOUS | Status: AC
  Administered 2021-05-23: 2 via TOPICAL

## 2021-05-23 MED ORDER — FENTANYL CITRATE (PF) 250 MCG/5ML IJ SOLN
INTRAMUSCULAR | Status: DC | PRN
Start: 2021-05-23 — End: 2021-05-23
  Administered 2021-05-23: 50 ug via INTRAVENOUS
  Administered 2021-05-23: 25 ug via INTRAVENOUS
  Administered 2021-05-23: 50 ug via INTRAVENOUS

## 2021-05-23 MED ORDER — DEXAMETHASONE SODIUM PHOSPHATE 10 MG/ML IJ SOLN
INTRAMUSCULAR | Status: AC
  Filled 2021-05-23: qty 1

## 2021-05-23 MED ORDER — DEXAMETHASONE SODIUM PHOSPHATE 10 MG/ML IJ SOLN
INTRAMUSCULAR | Status: DC | PRN
  Administered 2021-05-23: 5 mg via INTRAVENOUS

## 2021-05-23 MED ORDER — EPHEDRINE SULFATE-NACL 50-0.9 MG/10ML-% IV SOSY
PREFILLED_SYRINGE | INTRAVENOUS | Status: DC | PRN
  Administered 2021-05-23: 15 mg via INTRAVENOUS
  Administered 2021-05-23 (×2): 10 mg via INTRAVENOUS

## 2021-05-23 MED ORDER — VITAMIN D 25 MCG (1000 UNIT) PO TABS
2000.0000 [IU] | ORAL_TABLET | Freq: Two times a day (BID) | ORAL | Status: DC
  Administered 2021-05-23 – 2021-05-27 (×8): 2000 [IU] via ORAL
  Filled 2021-05-23 (×8): qty 2

## 2021-05-23 MED ORDER — MAGNESIUM OXIDE -MG SUPPLEMENT 400 (240 MG) MG PO TABS
200.0000 mg | ORAL_TABLET | Freq: Every day | ORAL | Status: DC
  Administered 2021-05-23 – 2021-05-27 (×5): 200 mg via ORAL
  Filled 2021-05-23 (×5): qty 1

## 2021-05-23 MED ORDER — ACETAMINOPHEN 500 MG PO TABS: 1000.0000 mg | ORAL_TABLET | Freq: Once | ORAL | Status: AC

## 2021-05-23 MED ORDER — INSULIN ASPART 100 UNIT/ML IJ SOLN
0.0000 [IU] | Freq: Every day | INTRAMUSCULAR | Status: DC
  Administered 2021-05-23: 3 [IU] via SUBCUTANEOUS
  Administered 2021-05-24 – 2021-05-25 (×2): 2 [IU] via SUBCUTANEOUS

## 2021-05-23 SURGICAL SUPPLY — 58 items
BAG COUNTER SPONGE SURGICOUNT (BAG) ×2 IMPLANT
BAG SPNG CNTER NS LX DISP (BAG) ×1
BALL CTTN LRG ABS STRL LF (GAUZE/BANDAGES/DRESSINGS) ×1
BIT DRILL 3.8X6 NS (BIT) ×1 IMPLANT
BIT DRILL 4.4 NS (BIT) ×1 IMPLANT
BLADE SURG 10 STRL SS (BLADE) ×2 IMPLANT
BNDG ELASTIC 4X5.8 VLCR STR LF (GAUZE/BANDAGES/DRESSINGS) ×2 IMPLANT
BNDG ELASTIC 6X5.8 VLCR STR LF (GAUZE/BANDAGES/DRESSINGS) ×2 IMPLANT
BNDG GAUZE ELAST 4 BULKY (GAUZE/BANDAGES/DRESSINGS) ×1 IMPLANT
BRUSH SCRUB EZ PLAIN DRY (MISCELLANEOUS) ×4 IMPLANT
COTTONBALL LRG STERILE PKG (GAUZE/BANDAGES/DRESSINGS) ×1 IMPLANT
COVER SURGICAL LIGHT HANDLE (MISCELLANEOUS) ×4 IMPLANT
DRAPE C-ARM 42X72 X-RAY (DRAPES) ×2 IMPLANT
DRAPE C-ARMOR (DRAPES) ×2 IMPLANT
DRAPE HALF SHEET 40X57 (DRAPES) IMPLANT
DRAPE INCISE IOBAN 66X45 STRL (DRAPES) IMPLANT
DRAPE U-SHAPE 47X51 STRL (DRAPES) ×2 IMPLANT
DRSG ADAPTIC 3X8 NADH LF (GAUZE/BANDAGES/DRESSINGS) ×2 IMPLANT
DRSG MEPITEL 4X7.2 (GAUZE/BANDAGES/DRESSINGS) ×2 IMPLANT
DRSG PAD ABDOMINAL 8X10 ST (GAUZE/BANDAGES/DRESSINGS) ×4 IMPLANT
ELECT REM PT RETURN 9FT ADLT (ELECTROSURGICAL) ×2
ELECTRODE REM PT RTRN 9FT ADLT (ELECTROSURGICAL) ×1 IMPLANT
GAUZE SPONGE 4X4 12PLY STRL (GAUZE/BANDAGES/DRESSINGS) ×2 IMPLANT
GAUZE SPONGE 4X4 12PLY STRL LF (GAUZE/BANDAGES/DRESSINGS) ×1 IMPLANT
GLOVE SRG 8 PF TXTR STRL LF DI (GLOVE) ×1 IMPLANT
GLOVE SURG ENC MOIS LTX SZ8 (GLOVE) ×2 IMPLANT
GLOVE SURG ENC MOIS LTX SZ8.5 (GLOVE) ×2 IMPLANT
GLOVE SURG ORTHO LTX SZ7.5 (GLOVE) ×4 IMPLANT
GLOVE SURG UNDER POLY LF SZ7.5 (GLOVE) ×2 IMPLANT
GLOVE SURG UNDER POLY LF SZ8 (GLOVE) ×2
GOWN STRL REUS W/ TWL LRG LVL3 (GOWN DISPOSABLE) ×2 IMPLANT
GOWN STRL REUS W/ TWL XL LVL3 (GOWN DISPOSABLE) ×1 IMPLANT
GOWN STRL REUS W/TWL LRG LVL3 (GOWN DISPOSABLE) ×4
GOWN STRL REUS W/TWL XL LVL3 (GOWN DISPOSABLE) ×2
GUIDE ROD 3.0 (MISCELLANEOUS) ×2
KIT BASIN OR (CUSTOM PROCEDURE TRAY) ×2 IMPLANT
KIT TURNOVER KIT B (KITS) ×2 IMPLANT
NAIL TIBIAL 10MMX30CM (Nail) ×1 IMPLANT
PACK ORTHO EXTREMITY (CUSTOM PROCEDURE TRAY) ×2 IMPLANT
PAD ARMBOARD 7.5X6 YLW CONV (MISCELLANEOUS) ×4 IMPLANT
PAD CAST 4YDX4 CTTN HI CHSV (CAST SUPPLIES) ×1 IMPLANT
PADDING CAST COTTON 4X4 STRL (CAST SUPPLIES) ×2
PADDING CAST COTTON 6X4 STRL (CAST SUPPLIES) ×2 IMPLANT
ROD GUIDE 3.0 (MISCELLANEOUS) IMPLANT
SCREW ACECAP 36MM (Screw) ×1 IMPLANT
SCREW ACECAP 42MM (Screw) ×1 IMPLANT
SCREW PROXIMAL DEPUY (Screw) ×4 IMPLANT
SCREW PRXML FT 45X5.5XLCK NS (Screw) IMPLANT
SCREW PRXML FT 65X5.5XNS CORT (Screw) IMPLANT
STAPLER VISISTAT 35W (STAPLE) ×2 IMPLANT
SUT ETHILON 2 0 FS 18 (SUTURE) ×4 IMPLANT
SUT VIC AB 0 CT1 27 (SUTURE)
SUT VIC AB 0 CT1 27XBRD ANBCTR (SUTURE) IMPLANT
SUT VIC AB 2-0 CT1 27 (SUTURE) ×2
SUT VIC AB 2-0 CT1 TAPERPNT 27 (SUTURE) ×1 IMPLANT
TOWEL GREEN STERILE (TOWEL DISPOSABLE) ×4 IMPLANT
TOWEL GREEN STERILE FF (TOWEL DISPOSABLE) ×2 IMPLANT
YANKAUER SUCT BULB TIP NO VENT (SUCTIONS) IMPLANT

## 2021-05-23 NOTE — Anesthesia Procedure Notes (Signed)
Procedure Name: Intubation Date/Time: 05/23/2021 2:17 PM Performed by: Tressie Ellis, CRNA Pre-anesthesia Checklist: Patient identified, Emergency Drugs available, Suction available and Patient being monitored Patient Re-evaluated:Patient Re-evaluated prior to induction Oxygen Delivery Method: Circle system utilized Preoxygenation: Pre-oxygenation with 100% oxygen Induction Type: IV induction Ventilation: Mask ventilation without difficulty Laryngoscope Size: Miller and 2 Grade View: Grade I Tube type: Oral Tube size: 7.0 mm Number of attempts: 1 Airway Equipment and Method: Stylet and Oral airway Placement Confirmation: ETT inserted through vocal cords under direct vision, positive ETCO2 and breath sounds checked- equal and bilateral Tube secured with: Tape Dental Injury: Teeth and Oropharynx as per pre-operative assessment

## 2021-05-23 NOTE — Progress Notes (Signed)
Pt received back to room via bed accompanied by RN.  NAD noted.  Telemetry and 02 switched over.  VS are stable.  Pt is able to her toes of her Rt foot.  Extremities are warm to touch. Pedal pulse Denies pain.

## 2021-05-23 NOTE — Progress Notes (Signed)
Family Medicine Teaching Service Daily Progress Note Intern Pager: [phone]  Patient name: Caitlin Oliver Medical record number: [id number] Date of birth: March 31, 1941 Age: 81 y.o. Gender: female  Primary Care Provider: [name], PA-C Consultants: PCCM, neuro, Ortho Code Status: DNR  Pt Overview and Major Events to Date:  1/27 admitted to F PTS 1/28 found to be hypotensive, tachycardic, encephalopathic and febrile-transferred to ICU 1/29 on BiPAP in ICU CT head no acute abnormalities 1/30 MRI brain showed atrophy/chronic small vessel ischemic infarct of left frontal lobe 1/30 on Precedex/Ativan/Haldol for agitation 1/31 transfer to [facility] from ICU 2/7 surgery on right tibia/fib fracture  Assessment and Plan:  Caitlin Oliver is an 81 year old female who presented with a fall secondary to syncopal versus mechanical fall resulting in right tibia and fibula closed fracture.  Past medical history significant for type 2 diabetes, carotid artery stenosis, status post bilateral endarterectomies, extensive PAD, pulmonary hypertension and COPD.  Right tib-fib closed fracture POD 1 intramedullary nail left tibia Patient received surgery yesterday and today feels fine, had some pain earlier this morning that was resolved with medication.  Right leg appears perfused with brisk capillary refill.   -Ortho following, appreciate care recommendations -Dilaudid 0.5 mg every 2 hours as needed -Tylenol 650 every 6 hours scheduled  Acute metabolic encephalopathy, resolved Patient today is A&Ox4.  At night BiPAP was charted as used. -Encourage consistent BiPAP use at night -Monitor mental status  Abdominal pain   constipation Abdomen was soft, tender to palpation diffusely, bowel sounds normoactive.  Last bowel movement noted was 2/5 -Dc'd heparin, restarted lovenox -KUB to monitor stool burden -Monitor -Miralax twice daily -Senna twice daily -Colace daily  Acute left frontal CVA -Restart  Plavix -continue aspirin  Chronic hypoxemic respiratory failure on 3 L O2 at baseline No increased work of breathing today on 3 liters -Keep oxygen saturations 88 to 92%  -BiPAP overnight  Chronic diastolic heart failure EF 65 to 70%   severe PAH Heart rates were tachycardic this morning.  On examination patient appeared euvolemic. -monitor HRs -Metoprolol 12.5 mg -Metolazone 5 mg daily -Hold home torsemide  T2DM CBGs have been 143-289.  Received 5 short-acting yesterday. -CBG monitoring -sSSI  CKD 3A Creatinine today is 1.32 from 1.18 yesterday. -Monitor  Decreased p.o. intake -Ensure, Glucerna, MVI  FEN/GI: Heart healthy PPx: Lovenox Dispo: Home pending clinical improvement/PT recs  Subjective:  Denies any acute issues overnight, feels her pain has been controlled this morning.  Has not had a bowel movement since the fifth.  Objective: Temp:  [97.7 F (36.5 C)-98.4 F (36.9 C)] 98 F (36.7 C) (02/08 0742) Pulse Rate:  [70-114] 99 (02/08 0742) Resp:  [17-21] 17 (02/08 0742) BP: (99-165)/(59-87) 111/71 (02/08 0742) SpO2:  [90 %-98 %] 98 % (02/08 0742) Physical Exam: General: NAD, laying in bed comfortably, alert and oriented x4 Cardiovascular: RRR no murmurs rubs or gallops Respiratory: Clear to auscultation bilaterally no wheezes rales or crackles Abdomen: Tender to light palpation, normal active bowel sounds Extremities: No lower extremity edema, right lower extremity wrapped with good perfusion  Laboratory: Recent Labs  Lab 05/23/21 0429 05/23/21 1837 05/24/21 0201  WBC 7.4 12.2* 15.7*  HGB 9.7* 10.0* 9.7*  HCT 29.1* 30.5* 29.3*  PLT 334 371 388   Recent Labs  Lab 05/23/21 0429 05/23/21 1837 05/24/21 0201  NA 141 140 139  K 4.0 4.5 4.3  CL 97* 99 96*  CO2 37* 32 27  BUN _0 CREATININE 1.18*  1.17* 1.32*  CALCIUM 9.4 9.3 9.0  GLUCOSE 158* 198* 193*      Imaging/Diagnostic Tests:   Gerrit Heck, MD 05/24/2021, 2:02 PM PGY-1,  [place] Intern pager: [phone], text pages welcome

## 2021-05-23 NOTE — Plan of Care (Signed)

## 2021-05-23 NOTE — TOC Progression Note (Signed)
Transition of Care [facility]) - Progression Note    Patient Details  Name: Caitlin Oliver MRN: [id number] Date of Birth: 12-Jan-1941  [facility]) CM/SW Contact  Sharin Mons, RN Phone Number: 05/23/2021, 10:05 AM  Clinical Narrative:    ORIF/ tibia repair scheduled for today.  TOC team will continue to monitor and assist with needs.   Expected Discharge Plan: [place] (vs SNF) Barriers to Discharge: Continued Medical Work up  Expected Discharge Plan and Services Expected Discharge Plan: [place] (vs SNF)                                               Social Determinants of Health (SDOH) Interventions    Readmission Risk Interventions Readmission Risk Prevention Plan 05/15/2021 11/17/2018  Transportation Screening Complete Complete  PCP or Specialist Appt within 3-5 Days Not Complete Not Complete  Not Complete comments not ready for d/c not ready for dc  HRI or [place] Complete Complete  Social Work Consult for [place] Planning/Counseling Complete Complete  Palliative Care Screening Not Applicable Not Applicable  Medication Review Press photographer) Referral to Pharmacy Patient Refused  Some recent data might be hidden

## 2021-05-23 NOTE — Progress Notes (Signed)
Orthopaedic Trauma Service (OTS) Consultation   Patient ID: Caitlin Oliver MRN: [id number] DOB/AGE: 01/09/1941 81 y.o.   Reason for Consult: right tibial shaft fracture oblique and shortened Referring Physician: Zollie Beckers, MD  HPI: Caitlin Oliver is an 81 y.o. female who fell with subsequent pulmonary complications, as well as associated delirium. Uses walker at baseline, lives alone,  no longer a smoker (8 yrs), several recent falls. Multiple and severe comorbidities acute management of which has delayed treatment for her fracture and will increase risks of complications going forward, including IDDM, CAD, cerebrovascular disease, lung disease with COPD, CHF.  Past Medical History:  Diagnosis Date   Anxiety    Arthritis    Cancer ([place])    lung cancer   Carotid artery disease (HCC)    Cataracts, bilateral    CHF (congestive heart failure) (HCC)    Claudication (HCC)    COPD (chronic obstructive pulmonary disease) ([place])    Diabetes mellitus    Dizzy    Family history of adverse reaction to anesthesia    Pt nephew has PONV   GERD (gastroesophageal reflux disease)    Headache    History of kidney stones    Hypercholesteremia    Hypertension    Peripheral arterial disease (HCC)    bilateral iliac artery stenosis by angiography   Pneumonia    Stomach ulcer    Stroke ([place])    Tobacco abuse    Wears glasses     Past Surgical History:  Procedure Laterality Date   ABDOMINAL AORTIC ENDOVASCULAR STENT GRAFT Right 06/19/2019   Procedure: INNOMINATE STENT, RIGHT CAROTID STENT, RIGHT SUBCLAVIAN STENT AND CAROTID CUTDOWN;  Surgeon: Marty Heck, MD;  Location: [place];  Service: Vascular;  Laterality: Right;   ABDOMINAL HYSTERECTOMY     APPENDECTOMY     BREAST REDUCTION SURGERY     CAROTID ANGIOGRAM N/A 02/25/2013   Procedure: CAROTID ANGIOGRAM;  Surgeon: Lorretta Harp, MD;  Location: [facility] CATH LAB;  Service: Cardiovascular;  Laterality: N/A;    ENDARTERECTOMY Right 03/06/2013   Procedure: ENDARTERECTOMY CAROTID-RIGHT;  Surgeon: Serafina Mitchell, MD;  Location: [place];  Service: Vascular;  Laterality: Right;   ENDARTERECTOMY Left 05/07/2013   Procedure: LEFT CAROTID ARTERY ENDARTERECTOMY WITH VASCU-GUARD PATCH ANGIOPLASTY ;  Surgeon: Serafina Mitchell, MD;  Location: [place];  Service: Vascular;  Laterality: Left;   INTRAOPERATIVE ARTERIOGRAM N/A 06/19/2019   Procedure: Clydie Braun;  Surgeon: Marty Heck, MD;  Location: [place];  Service: Vascular;  Laterality: N/A;   IR ANGIO INTRA EXTRACRAN SEL COM CAROTID INNOMINATE UNI R MOD SED  05/14/2019   IR ANGIO VERTEBRAL SEL SUBCLAVIAN INNOMINATE BILAT MOD SED  05/14/2019   LOBECTOMY Left 02/03/2016   Procedure: LEFT UPPER LOBECTOMY;  Surgeon: [name], MD;  Location: [place];  Service: Thoracic;  Laterality: Left;   LOWER EXTREMITY ANGIOGRAM N/A 02/25/2013   Procedure: LOWER EXTREMITY ANGIOGRAM;  Surgeon: Lorretta Harp, MD;  Location: [facility] CATH LAB;  Service: Cardiovascular;  Laterality: N/A;   LOWER EXTREMITY ANGIOGRAM N/A 07/27/2013   Procedure: LOWER EXTREMITY ANGIOGRAM;  Surgeon: Lorretta Harp, MD;  Location: [facility] CATH LAB;  Service: Cardiovascular;  Laterality: N/A;   PATCH ANGIOPLASTY Right 03/06/2013   Procedure: PATCH ANGIOPLASTY of Right Carotid Artery using Vascu-Guard Patch;  Surgeon: Serafina Mitchell, MD;  Location: [place];  Service: Vascular;  Laterality: Right;   PERIPHERAL VASCULAR CATHETERIZATION N/A 11/15/2015   Procedure:  Aortic Arch Angiography;  Surgeon: Serafina Mitchell, MD;  Location: [place] CV LAB;  Service: Cardiovascular;  Laterality: N/A;   PERIPHERAL VASCULAR CATHETERIZATION Bilateral 11/15/2015   Procedure: Carotid Angiography;  Surgeon: Serafina Mitchell, MD;  Location: [place] CV LAB;  Service: Cardiovascular;  Laterality: Bilateral;   PERIPHERAL VASCULAR CATHETERIZATION Left 11/15/2015   Procedure: Upper Extremity Angiography;  Surgeon: Serafina Mitchell,  MD;  Location: [place] CV LAB;  Service: Cardiovascular;  Laterality: Left;   PERIPHERAL VASCULAR CATHETERIZATION Left 11/15/2015   Procedure: Peripheral Vascular Intervention;  Surgeon: Serafina Mitchell, MD;  Location: [place] CV LAB;  Service: Cardiovascular;  Laterality: Left;  subclavian    RIGHT HEART CATH N/A 06/16/2019   Procedure: RIGHT HEART CATH;  Surgeon: Jolaine Artist, MD;  Location: [place] CV LAB;  Service: Cardiovascular;  Laterality: N/A;   RIGHT/LEFT HEART CATH AND CORONARY ANGIOGRAPHY N/A 11/20/2018   Procedure: RIGHT/LEFT HEART CATH AND CORONARY ANGIOGRAPHY;  Surgeon: Nelva Bush, MD;  Location: [place] CV LAB;  Service: Cardiovascular;  Laterality: N/A;   SALIVARY GLAND SURGERY     scar tissue removed from left saliva glad   TUBAL LIGATION     ULTRASOUND GUIDANCE FOR VASCULAR ACCESS Right 06/19/2019   Procedure: Ultrasound Guidance For Vascular Access;  Surgeon: Marty Heck, MD;  Location: [place];  Service: Vascular;  Laterality: Right;   VIDEO ASSISTED THORACOSCOPY (VATS)/WEDGE RESECTION Left 02/03/2016   Procedure: VIDEO ASSISTED THORACOSCOPY;  Surgeon: [name], MD;  Location: [place];  Service: Thoracic;  Laterality: Left;    Family History  Problem Relation Age of Onset   Stroke Mother    Hypertension Mother    Kidney failure Father    Lung disease Neg Hx     Social History:  reports that she quit smoking about 7 years ago. Her smoking use included cigarettes. She has a 84.00 pack-year smoking history. She quit smokeless tobacco use about 8 years ago. She reports that she does not drink alcohol and does not use drugs.  Allergies:  Allergies  Allergen Reactions   Fentanyl Shortness Of Breath    Medications: Scheduled:  acetaminophen       [MAR Hold] acetaminophen  650 mg Oral Q6H   [MAR Hold] aspirin EC  81 mg Oral Daily   [MAR Hold] atorvastatin  40 mg Oral Daily   chlorhexidine  60 mL Topical Once   [MAR Hold]  chlorhexidine  15 mL Mouth Rinse BID   [MAR Hold] Chlorhexidine Gluconate Cloth  6 each Topical Daily   [MAR Hold] docusate  100 mg Oral Daily   [MAR Hold] feeding supplement (GLUCERNA SHAKE)  237 mL Oral BID BM   [MAR Hold] FLUoxetine  20 mg Oral Daily   [MAR Hold] insulin aspart  0-9 Units Subcutaneous Q4H   [MAR Hold] mouth rinse  15 mL Mouth Rinse q12n4p   [MAR Hold] metoprolol succinate  12.5 mg Oral Daily   [MAR Hold] multivitamin with minerals  1 tablet Oral Daily   [MAR Hold] pantoprazole  40 mg Oral BID   [MAR Hold] polyethylene glycol  17 g Oral BID   povidone-iodine  2 application Topical Once   [MAR Hold] Ensure Max Protein  11 oz Oral Daily   [MAR Hold] revefenacin  175 mcg Nebulization Daily   [MAR Hold] senna  1 tablet Oral Daily    Results for orders placed or performed during the hospital encounter of 05/12/21 (from the past 48  hour(s))  Glucose, capillary     Status: Abnormal   Collection Time: 05/21/21  4:31 PM  Result Value Ref Range   Glucose-Capillary 155 (H) 70 - 99 mg/dL    Comment: Glucose reference range applies only to samples taken after fasting for at least 8 hours.  Glucose, capillary     Status: Abnormal   Collection Time: 05/21/21 10:13 PM  Result Value Ref Range   Glucose-Capillary 131 (H) 70 - 99 mg/dL    Comment: Glucose reference range applies only to samples taken after fasting for at least 8 hours.  Glucose, capillary     Status: Abnormal   Collection Time: 05/22/21 12:06 AM  Result Value Ref Range   Glucose-Capillary 126 (H) 70 - 99 mg/dL    Comment: Glucose reference range applies only to samples taken after fasting for at least 8 hours.  Basic metabolic panel     Status: Abnormal   Collection Time: 05/22/21  3:48 AM  Result Value Ref Range   Sodium 137 135 - 145 mmol/L   Potassium 4.2 3.5 - 5.1 mmol/L   Chloride 92 (L) 98 - 111 mmol/L   CO2 38 (H) 22 - 32 mmol/L   Glucose, Bld 138 (H) 70 - 99 mg/dL    Comment: Glucose reference range  applies only to samples taken after fasting for at least 8 hours.   BUN 22 8 - 23 mg/dL   Creatinine, Ser 1.40 (H) 0.44 - 1.00 mg/dL   Calcium 9.0 8.9 - 10.3 mg/dL   GFR, Estimated 38 (L) >60 mL/min    Comment: (NOTE) Calculated using the CKD-EPI Creatinine Equation (2021)    Anion gap 7 5 - 15    Comment: Performed at [facility] [address] [place], [place] [address]  CBC     Status: Abnormal   Collection Time: 05/22/21  3:48 AM  Result Value Ref Range   WBC 7.8 4.0 - 10.5 K/uL   RBC 2.97 (L) 3.87 - 5.11 MIL/uL   Hemoglobin 9.1 (L) 12.0 - 15.0 g/dL   HCT 28.1 (L) 36.0 - 46.0 %   MCV 94.6 80.0 - 100.0 fL   MCH 30.6 26.0 - 34.0 pg   MCHC 32.4 30.0 - 36.0 g/dL   RDW 13.0 11.5 - 15.5 %   Platelets 321 150 - 400 K/uL   nRBC 0.0 0.0 - 0.2 %    Comment: Performed at [facility], [place] [address], [place], [place] [address]  Glucose, capillary     Status: Abnormal   Collection Time: 05/22/21  3:52 AM  Result Value Ref Range   Glucose-Capillary 137 (H) 70 - 99 mg/dL    Comment: Glucose reference range applies only to samples taken after fasting for at least 8 hours.  Glucose, capillary     Status: Abnormal   Collection Time: 05/22/21  7:41 AM  Result Value Ref Range   Glucose-Capillary 193 (H) 70 - 99 mg/dL    Comment: Glucose reference range applies only to samples taken after fasting for at least 8 hours.  Glucose, capillary     Status: Abnormal   Collection Time: 05/22/21 11:01 AM  Result Value Ref Range   Glucose-Capillary 142 (H) 70 - 99 mg/dL    Comment: Glucose reference range applies only to samples taken after fasting for at least 8 hours.  Glucose, capillary     Status: Abnormal   Collection Time: 05/22/21  3:59 PM  Result Value Ref Range  Glucose-Capillary 200 (H) 70 - 99 mg/dL    Comment: Glucose reference range applies only to samples taken after fasting for at least 8 hours.  Glucose, capillary     Status: Abnormal   Collection Time: 05/22/21   8:39 PM  Result Value Ref Range   Glucose-Capillary 150 (H) 70 - 99 mg/dL    Comment: Glucose reference range applies only to samples taken after fasting for at least 8 hours.  Glucose, capillary     Status: Abnormal   Collection Time: 05/22/21 11:44 PM  Result Value Ref Range   Glucose-Capillary 134 (H) 70 - 99 mg/dL    Comment: Glucose reference range applies only to samples taken after fasting for at least 8 hours.  Glucose, capillary     Status: Abnormal   Collection Time: 05/23/21  4:25 AM  Result Value Ref Range   Glucose-Capillary 153 (H) 70 - 99 mg/dL    Comment: Glucose reference range applies only to samples taken after fasting for at least 8 hours.  CBC     Status: Abnormal   Collection Time: 05/23/21  4:29 AM  Result Value Ref Range   WBC 7.4 4.0 - 10.5 K/uL   RBC 3.11 (L) 3.87 - 5.11 MIL/uL   Hemoglobin 9.7 (L) 12.0 - 15.0 g/dL   HCT 29.1 (L) 36.0 - 46.0 %   MCV 93.6 80.0 - 100.0 fL   MCH 31.2 26.0 - 34.0 pg   MCHC 33.3 30.0 - 36.0 g/dL   RDW 13.3 11.5 - 15.5 %   Platelets 334 150 - 400 K/uL   nRBC 0.0 0.0 - 0.2 %    Comment: Performed at [facility], [place] [address], [place], [place] [address]  Basic metabolic panel     Status: Abnormal   Collection Time: 05/23/21  4:29 AM  Result Value Ref Range   Sodium 141 135 - 145 mmol/L   Potassium 4.0 3.5 - 5.1 mmol/L   Chloride 97 (L) 98 - 111 mmol/L   CO2 37 (H) 22 - 32 mmol/L   Glucose, Bld 158 (H) 70 - 99 mg/dL    Comment: Glucose reference range applies only to samples taken after fasting for at least 8 hours.   BUN 16 8 - 23 mg/dL   Creatinine, Ser 1.18 (H) 0.44 - 1.00 mg/dL   Calcium 9.4 8.9 - 10.3 mg/dL   GFR, Estimated 47 (L) >60 mL/min    Comment: (NOTE) Calculated using the CKD-EPI Creatinine Equation (2021)    Anion gap 7 5 - 15    Comment: Performed at [facility] [address], [place], [place] [address]  Glucose, capillary     Status: Abnormal   Collection Time: 05/23/21  4:47 AM   Result Value Ref Range   Glucose-Capillary 161 (H) 70 - 99 mg/dL    Comment: Glucose reference range applies only to samples taken after fasting for at least 8 hours.  Glucose, capillary     Status: Abnormal   Collection Time: 05/23/21  7:50 AM  Result Value Ref Range   Glucose-Capillary 147 (H) 70 - 99 mg/dL    Comment: Glucose reference range applies only to samples taken after fasting for at least 8 hours.  Glucose, capillary     Status: Abnormal   Collection Time: 05/23/21 11:35 AM  Result Value Ref Range   Glucose-Capillary 149 (H) 70 - 99 mg/dL    Comment: Glucose reference range applies only to samples taken after fasting  for at least 8 hours.  Glucose, capillary     Status: Abnormal   Collection Time: 05/23/21  1:08 PM  Result Value Ref Range   Glucose-Capillary 143 (H) 70 - 99 mg/dL    Comment: Glucose reference range applies only to samples taken after fasting for at least 8 hours.    No results found.  Intake/Output      02/06 0701 02/07 0700 02/07 0701 02/08 0700   P.O.  0   Total Intake(mL/kg)  0 (0)   Urine (mL/kg/hr) 1100 (0.6) 400 (0.8)   Stool 0    Total Output 1100 400   Net -1100 -400        Stool Occurrence 2 x       ROS No recent fever, bleeding abnormalities (Plavix), urologic dysfunction, GI problems, or weight gain. Blood pressure (!) 154/54, pulse (!) 58, temperature 98.3 F (36.8 C), resp. rate 17, height _0  (1.626 m), weight 79.3 kg, SpO2 100 %. Physical Exam NCAT, very pleasant RLE Dressing intact, clean, dry  Edema/ swelling controlled  Sens: DPN, SPN, TN intact  Motor: EHL, FHL, and lessor toe ext and flex all intact grossly  Brisk cap refill, warm to touch  Assessment/Plan:  Right tibia and fibula fracture with shortening and oblique fracture site Baseline use of walker Multiple comorbidites with prolonged ICU stay, IDDM, CAD, cerebrovascular disease, lung disease with COPD, CHF  Given the shortening and delay to surgery with  associated comrobidities, Dr. Ninfa Linden asserted this was outside his scope of practice and that it would be in the best interest of the patient to have these injuries evaluated and treated by a fellowship trained orthopaedic traumatologist. Consequently, I was consulted to provide further evaluation and management.  I have now discussed with the patient the risks and benefits of surgery, including the possibility of infection, nerve injury, vessel injury, wound breakdown, arthritis, symptomatic hardware, DVT/ PE, loss of motion, malunion, nonunion, and need for further surgery among others.  We also specifically discussed the elevated risks of complications because of her comorbidities.  She acknowledged these risks and provided consent to proceed.  Altamese Espy, MD [facility], [facility] [phone]  05/23/2021, 1:38 PM  [facility] [place] [address] [phone] Jenetta [phone] (F)    After 5pm and on the weekends please log on to Amion, go to orthopaedics and the look under the [facility] Call for the provider(s) on call. You can also call our office at [phone] and then follow the prompts to be connected to the call team.

## 2021-05-23 NOTE — Anesthesia Postprocedure Evaluation (Signed)
Anesthesia Post Note  Patient: Caitlin Oliver  Procedure(s) Performed: INTRAMEDULLARY (IM) NAIL TIBIAL (Right)     Patient location during evaluation: PACU Anesthesia Type: General Level of consciousness: awake and alert Pain management: pain level controlled Vital Signs Assessment: post-procedure vital signs reviewed and stable Respiratory status: spontaneous breathing, nonlabored ventilation, respiratory function stable and patient connected to nasal cannula oxygen Cardiovascular status: blood pressure returned to baseline and stable Postop Assessment: no apparent nausea or vomiting Anesthetic complications: no   No notable events documented.  Last Vitals:  Vitals:   05/23/21 1735 05/23/21 1758  BP: (!) 160/71 (!) 165/87  Pulse: 70 73  Resp: 19 18  Temp:  36.5 C  SpO2: 96%     Last Pain:  Vitals:   05/23/21 1758  TempSrc: Oral  PainSc:                  [name]

## 2021-05-23 NOTE — Progress Notes (Signed)
Speech Language Pathology Treatment: Cognitive-Linquistic  Patient Details Name: Caitlin Oliver MRN: [id number] DOB: Dec 29, 1940 Today's Date: 05/23/2021 Time: 4967-5916 SLP Time Calculation (min) (ACUTE ONLY): 21 min  Assessment / Plan / Recommendation Clinical Impression  Pt was seen at bedside for cognitive-linguistic treatment session. Session focused on continued training and implementation of strategies into functional tasks. The client was educated on incorporating note-taking strategies to recall information from doctors in regards to current status, recommendations, surgery, and plans for discharge. Patient remembered external memory aid (paper & pen) from last targeted session, however she verbalized not using this independently and consistently. Patient verbalized using external memory aids independently at home (I.e., tablets/sticky notes). Patient was instructed to write down that she had a stroke in the left hemisphere to facilitate future recall of why she was experiencing falls and subsequently admitted to the hospital. Pt requires minimal verbal cueing to use memory aids in structured environment. Pt is demonstrating improved cognitive skills, and will benefit from continued skilled interventions to facilitate generalization of skills across environments.    HPI HPI: 81 year old female with multiple medical problems admitted 1/27 with severe tib/fib fxs of RLE; planned IM fixation but declined medically with lethargy,  hypercarbic respiratory failure; placed on BiPAP and surgery was cancelled.  Current dx include shock, acute left frontal CVA, acute hypoxic respiratory failure.  PMHx significant for type 2 diabetes, carotid artery stenosis status post bilateral endarterectomies, extensive PAD, pulmonary hypertension, and COPD on 3 L of oxygen at home.  MRI with atrophy, chronic small vessel ischemic changes, acute punctate white matter infarct of L frontal lobe. Previous bedside swallow  evaluation by SLP noted expressive aphasia with anomia, semantic and phonemic paraphasias, and awareness of deficits.      SLP Plan  Continue with current plan of care      Recommendations for follow up therapy are one component of a multi-disciplinary discharge planning process, led by the attending physician.  Recommendations may be updated based on patient status, additional functional criteria and insurance authorization.    Recommendations                   Oral Care Recommendations: Oral care BID Follow Up Recommendations: Skilled nursing-short term rehab (<3 hours/day) Assistance recommended at discharge: Frequent or constant Supervision/Assistance SLP Visit Diagnosis: Cognitive communication deficit (R41.841);Aphasia (R47.01) Plan: Continue with current plan of care           [facility]  05/23/2021, 11:55 AM

## 2021-05-23 NOTE — Transfer of Care (Signed)
Immediate Anesthesia Transfer of Care Note  Patient: Caitlin Oliver  Procedure(s) Performed: INTRAMEDULLARY (IM) NAIL TIBIAL (Right)  Patient Location: PACU  Anesthesia Type:General  Level of Consciousness: awake, alert  and oriented  Airway & Oxygen Therapy: Patient Spontanous Breathing and Patient connected to nasal cannula oxygen  Post-op Assessment: Report given to RN and Post -op Vital signs reviewed and stable  Post vital signs: Reviewed and stable  Last Vitals:  Vitals Value Taken Time  BP 146/78 05/23/21 1619  Temp    Pulse 90 05/23/21 1619  Resp 19 05/23/21 1619  SpO2 90 % 05/23/21 1619  Vitals shown include unvalidated device data.  Last Pain:  Vitals:   05/23/21 0830  TempSrc: Oral  PainSc: 0-No pain      Patients Stated Pain Goal: 3 (44/01/02 7253)  Complications: No notable events documented.

## 2021-05-23 NOTE — Progress Notes (Signed)
FPTS Brief Progress Note  S: Saw Ms. Fenlon post-operatively. She is doing well, only reports that she is tired and would like to sleep.   Nursing reports that she received one dose of her Dilaudid around 2230.     O: BP 138/67    Pulse 93    Temp 98.2 F (36.8 C)    Resp 18    Ht _0  (1.626 m)    Wt 79.3 kg    SpO2 93%    BMI 30.01 kg/m   General: NAD, pleasant, able to participate in exam Respiratory: No respiratory distress CV: RRR without m/r/g Skin: warm and dry, no rashes noted, well perfused, wearing boot on right foot  Psych: Normal affect and mood   A/P: -Holding eliquis for now  -BiPaP at night  -Likely needs PT/OT assessment at some point -Rest of treatment plan per day team  Erskine Emery, MD 05/23/2021, 11:58 PM PGY-1, [facility] Night Resident  Please page [phone] with questions.

## 2021-05-23 NOTE — Progress Notes (Signed)
Orthopedic Tech Progress Note Patient Details:  Caitlin Oliver [birth year] [id number]  OR RN called requesting a PRAFO BOOT for patient drooped off to OR desk  Ortho Devices Type of Ortho Device: Prafo boot/shoe Ortho Device/Splint Location: RLE Ortho Device/Splint Interventions: Ordered, Other (comment)   Post Interventions Patient Tolerated: Well Instructions Provided: [facility] of [place] 05/23/2021, 5:47 PM

## 2021-05-24 ENCOUNTER — Encounter (HOSPITAL_COMMUNITY): Payer: Self-pay | Admitting: Orthopedic Surgery

## 2021-05-24 ENCOUNTER — Inpatient Hospital Stay (HOSPITAL_COMMUNITY): Payer: PPO

## 2021-05-24 DIAGNOSIS — K5900 Constipation, unspecified: Secondary | ICD-10-CM | POA: Diagnosis not present

## 2021-05-24 DIAGNOSIS — T148XXA Other injury of unspecified body region, initial encounter: Secondary | ICD-10-CM | POA: Diagnosis not present

## 2021-05-24 LAB — CBC
HCT: 29.3 % — ABNORMAL LOW (ref 36.0–46.0)
Hemoglobin: 9.7 g/dL — ABNORMAL LOW (ref 12.0–15.0)
MCH: 31.4 pg (ref 26.0–34.0)
MCHC: 33.1 g/dL (ref 30.0–36.0)
MCV: 94.8 fL (ref 80.0–100.0)
Platelets: 388 10*3/uL (ref 150–400)
RBC: 3.09 MIL/uL — ABNORMAL LOW (ref 3.87–5.11)
RDW: 13.7 % (ref 11.5–15.5)
WBC: 15.7 10*3/uL — ABNORMAL HIGH (ref 4.0–10.5)
nRBC: 0 % (ref 0.0–0.2)

## 2021-05-24 LAB — BASIC METABOLIC PANEL
Anion gap: 16 — ABNORMAL HIGH (ref 5–15)
BUN: 20 mg/dL (ref 8–23)
CO2: 27 mmol/L (ref 22–32)
Calcium: 9 mg/dL (ref 8.9–10.3)
Chloride: 96 mmol/L — ABNORMAL LOW (ref 98–111)
Creatinine, Ser: 1.32 mg/dL — ABNORMAL HIGH (ref 0.44–1.00)
GFR, Estimated: 41 mL/min — ABNORMAL LOW (ref >60)
Glucose, Bld: 193 mg/dL — ABNORMAL HIGH (ref 70–99)
Potassium: 4.3 mmol/L (ref 3.5–5.1)
Sodium: 139 mmol/L (ref 135–145)

## 2021-05-24 LAB — GLUCOSE, CAPILLARY
Glucose-Capillary: 198 mg/dL — ABNORMAL HIGH (ref 70–99)
Glucose-Capillary: 199 mg/dL — ABNORMAL HIGH (ref 70–99)
Glucose-Capillary: 133 mg/dL — ABNORMAL HIGH (ref 70–99)
Glucose-Capillary: 211 mg/dL — ABNORMAL HIGH (ref 70–99)
Glucose-Capillary: 220 mg/dL — ABNORMAL HIGH (ref 70–99)

## 2021-05-24 LAB — VITAMIN D 25 HYDROXY (VIT D DEFICIENCY, FRACTURES): Vit D, 25-Hydroxy: 32.34 ng/mL (ref 30–100)

## 2021-05-24 MED ORDER — ENOXAPARIN SODIUM 40 MG/0.4ML IJ SOSY
40.0000 mg | PREFILLED_SYRINGE | INTRAMUSCULAR | Status: DC
  Administered 2021-05-25 – 2021-05-27 (×3): 40 mg via SUBCUTANEOUS
  Filled 2021-05-24 (×3): qty 0.4

## 2021-05-24 MED ORDER — METOPROLOL SUCCINATE ER 25 MG PO TB24
12.5000 mg | ORAL_TABLET | Freq: Every day | ORAL | Status: DC
  Administered 2021-05-24 – 2021-05-27 (×4): 12.5 mg via ORAL
  Filled 2021-05-24 (×4): qty 1

## 2021-05-24 MED ORDER — CLOPIDOGREL BISULFATE 75 MG PO TABS
75.0000 mg | ORAL_TABLET | Freq: Every day | ORAL | Status: DC
  Administered 2021-05-24 – 2021-05-27 (×4): 75 mg via ORAL
  Filled 2021-05-24 (×4): qty 1

## 2021-05-24 MED ORDER — SENNA 8.6 MG PO TABS
1.0000 | ORAL_TABLET | Freq: Two times a day (BID) | ORAL | Status: DC
  Administered 2021-05-24 – 2021-05-25 (×2): 8.6 mg via ORAL
  Filled 2021-05-24 (×2): qty 1

## 2021-05-24 NOTE — Plan of Care (Signed)

## 2021-05-24 NOTE — Plan of Care (Signed)
Problem: Education: Goal: Knowledge of General Education information will improve Description: Including pain rating scale, medication(s)/side effects and non-pharmacologic comfort measures Outcome: Progressing   Problem: Health Behavior/Discharge Planning: Goal: Ability to manage health-related needs will improve Outcome: Progressing   Problem: Clinical Measurements: Goal: Ability to maintain clinical measurements within normal limits will improve Outcome: Progressing   Problem: Coping: Goal: Level of anxiety will decrease Outcome: Progressing   Problem: Pain Managment: Goal: General experience of comfort will improve Outcome: Progressing   Problem: Safety: Goal: Ability to remain free from injury will improve Outcome: Progressing   Problem: Skin Integrity: Goal: Risk for impaired skin integrity will decrease Outcome: Progressing

## 2021-05-24 NOTE — Progress Notes (Addendum)
Orthopaedic Trauma Service Progress Note  Patient ID: Caitlin Oliver MRN: [id number] DOB/AGE: Sep 03, 1940 81 y.o.  Subjective:  Doing ok Pain R leg better than pre-op but still hurts  No other complaints    ROS As above  Objective:   VITALS:   Vitals:   05/24/21 0454 05/24/21 0503 05/24/21 0524 05/24/21 0742  BP: (!) 100/59 (!) 99/59 101/62 111/71  Pulse: (!) 113 (!) 114 (!) 110 99  Resp:    17  Temp: 98 F (36.7 C)  98.4 F (36.9 C) 98 F (36.7 C)  TempSrc:   Oral Oral  SpO2: 91% 97% 98% 98%  Weight:      Height:        Estimated body mass index is 30.01 kg/m as calculated from the following:   Height as of this encounter: _0  (1.626 m).   Weight as of this encounter: 79.3 kg.   Intake/Output      02/07 0701 02/08 0700 02/08 0701 02/09 0700   P.O. 0    I.V. (mL/kg) 1440.3 (18.2)    Total Intake(mL/kg) 1440.3 (18.2)    Urine (mL/kg/hr) 1000 (0.5)    Stool     Blood 25    Total Output 1025    Net +415.3           LABS  Results for orders placed or performed during the hospital encounter of 05/12/21 (from the past 24 hour(s))  Glucose, capillary     Status: Abnormal   Collection Time: 05/23/21 11:35 AM  Result Value Ref Range   Glucose-Capillary 149 (H) 70 - 99 mg/dL  Glucose, capillary     Status: Abnormal   Collection Time: 05/23/21  1:08 PM  Result Value Ref Range   Glucose-Capillary 143 (H) 70 - 99 mg/dL  Basic metabolic panel     Status: Abnormal   Collection Time: 05/23/21  6:37 PM  Result Value Ref Range   Sodium 140 135 - 145 mmol/L   Potassium 4.5 3.5 - 5.1 mmol/L   Chloride 99 98 - 111 mmol/L   CO2 32 22 - 32 mmol/L   Glucose, Bld 198 (H) 70 - 99 mg/dL   BUN 14 8 - 23 mg/dL   Creatinine, Ser 1.17 (H) 0.44 - 1.00 mg/dL   Calcium 9.3 8.9 - 10.3 mg/dL   GFR, Estimated 47 (L) >60 mL/min   Anion gap 9 5 - 15  Magnesium     Status: None   Collection Time:  05/23/21  6:37 PM  Result Value Ref Range   Magnesium 2.3 1.7 - 2.4 mg/dL  Phosphorus     Status: None   Collection Time: 05/23/21  6:37 PM  Result Value Ref Range   Phosphorus 4.0 2.5 - 4.6 mg/dL  CBC     Status: Abnormal   Collection Time: 05/23/21  6:37 PM  Result Value Ref Range   WBC 12.2 (H) 4.0 - 10.5 K/uL   RBC 3.20 (L) 3.87 - 5.11 MIL/uL   Hemoglobin 10.0 (L) 12.0 - 15.0 g/dL   HCT 30.5 (L) 36.0 - 46.0 %   MCV 95.3 80.0 - 100.0 fL   MCH 31.3 26.0 - 34.0 pg   MCHC 32.8 30.0 - 36.0 g/dL   RDW 13.4 11.5 - 15.5 %   Platelets 371 150 -  400 K/uL   nRBC 0.0 0.0 - 0.2 %  Brain natriuretic peptide     Status: Abnormal   Collection Time: 05/23/21  6:37 PM  Result Value Ref Range   B Natriuretic Peptide 194.4 (H) 0.0 - 100.0 pg/mL  Glucose, capillary     Status: Abnormal   Collection Time: 05/23/21  9:04 PM  Result Value Ref Range   Glucose-Capillary 289 (H) 70 - 99 mg/dL  CBC     Status: Abnormal   Collection Time: 05/24/21  2:01 AM  Result Value Ref Range   WBC 15.7 (H) 4.0 - 10.5 K/uL   RBC 3.09 (L) 3.87 - 5.11 MIL/uL   Hemoglobin 9.7 (L) 12.0 - 15.0 g/dL   HCT 29.3 (L) 36.0 - 46.0 %   MCV 94.8 80.0 - 100.0 fL   MCH 31.4 26.0 - 34.0 pg   MCHC 33.1 30.0 - 36.0 g/dL   RDW 13.7 11.5 - 15.5 %   Platelets 388 150 - 400 K/uL   nRBC 0.0 0.0 - 0.2 %  Basic metabolic panel     Status: Abnormal   Collection Time: 05/24/21  2:01 AM  Result Value Ref Range   Sodium 139 135 - 145 mmol/L   Potassium 4.3 3.5 - 5.1 mmol/L   Chloride 96 (L) 98 - 111 mmol/L   CO2 27 22 - 32 mmol/L   Glucose, Bld 193 (H) 70 - 99 mg/dL   BUN 20 8 - 23 mg/dL   Creatinine, Ser 1.32 (H) 0.44 - 1.00 mg/dL   Calcium 9.0 8.9 - 10.3 mg/dL   GFR, Estimated 41 (L) >60 mL/min   Anion gap 16 (H) 5 - 15  Glucose, capillary     Status: Abnormal   Collection Time: 05/24/21  6:51 AM  Result Value Ref Range   Glucose-Capillary 198 (H) 70 - 99 mg/dL     PHYSICAL EXAM:   Gen: sitting up in bed, NAD Lungs:  unlabored Cardiac: reg Ext:       Right Lower Extremity   Dressing clean, dry and intact  Mild swelling  Ext warm   + DP pulse  No DCT   Compartments soft   No pain out of proportion with passive stretch   DPN, SPN, TN sensation grossly intact  EHL, FHL, lesser toe motor intact  Ankle flexion, extension, inversion and eversion are intact          Assessment/Plan: 1 Day Post-Op    Anti-infectives (From admission, onward)    Start     Dose/Rate Route Frequency Ordered Stop   05/24/21 0600  ceFAZolin (ANCEF) IVPB 2g/100 mL premix        2 g 200 mL/hr over 30 Minutes Intravenous On call to O.R. 05/23/21 1337 05/23/21 1419   05/23/21 2200  ceFAZolin (ANCEF) IVPB 2g/100 mL premix        2 g 200 mL/hr over 30 Minutes Intravenous Every 8 hours 05/23/21 1807 05/24/21 2159   05/23/21 1337  ceFAZolin (ANCEF) 2-4 GM/100ML-% IVPB       Note to Pharmacy: Humberto Leep O: cabinet override      05/23/21 1337 05/24/21 0144   05/15/21 1000  vancomycin (VANCOCIN) IVPB 1000 mg/200 mL premix  Status:  Discontinued        1,000 mg 200 mL/hr over 60 Minutes Intravenous Every 48 hours 05/13/21 1015 05/14/21 0814   05/13/21 0900  vancomycin (VANCOCIN) IVPB 1000 mg/200 mL premix        1,000 mg  200 mL/hr over 60 Minutes Intravenous STAT 05/13/21 0846 05/13/21 1041   05/13/21 0845  cefTRIAXone (ROCEPHIN) 2 g in sodium chloride 0.9 % 100 mL IVPB        2 g 200 mL/hr over 30 Minutes Intravenous Every 24 hours 05/13/21 0803 05/17/21 1229   05/13/21 0600  ceFAZolin (ANCEF) IVPB 2g/100 mL premix  Status:  Discontinued        2 g 200 mL/hr over 30 Minutes Intravenous To Short Stay 05/13/21 0253 05/13/21 0803     .  POD/HD#: 1  81 y/o female s/p fall approximately 2 weeks ago   -fall 7/00/1749 complicated by acute metabolic enchephalopathy/shock  Metabolic derangements improving  - closed R tibia and fibula fracture s/p IMN  NWB R leg  Unrestricted ROM R ankle and knee  Dressing change  tomorrow vs Friday   Ice and elevate for swelling and pain control   Therapy evals    PRAFO boot R ankle for comfort   PT- please teach HEP for R knee ROM- AROM, PROM. Prone exercises as well. No ROM restrictions.  Quad sets, SLR, LAQ, SAQ, heel slides, stretching, prone flexion and extension  Ankle theraband program, heel cord stretching, toe towel curls, etc  No pillows under bend of knee when at rest, ok to place under heel to help work on extension. Can also use zero knee bone foam if available    - Pain management:  Multimodal   Minimize narcotics   - DVT/PE prophylaxis:  Ok to resume home plavix from ortho standpoint   Continue ASA   - ID:   Periop abx  - Metabolic Bone Disease:  Poor bone quality---> multifactorial    DM, COPD on supp o2, nicotine dependence, chronic torsemide, PPI use,   This is a fragility fracture   DEXA as outpt   Check vitamin D    Outpt referral to osteoporosis clinic    - Activity:  As above  - Impediments to fracture healing:  As above  - Dispo:  Ortho issues stable  Therapy evals     Jari Pigg, PA-C [phone] (C) 05/24/2021, 8:42 AM  [facility] [place] [id number] [phone] Jenetta [phone] (F)    After 5pm and on the weekends please log on to Amion, go to orthopaedics and the look under the [facility] Call for the provider(s) on call. You can also call our office at [phone] and then follow the prompts to be connected to the call team.   Patient ID: Caitlin Oliver, female   DOB: 1940/08/09, 80 y.o.   MRN: [id number]

## 2021-05-25 ENCOUNTER — Inpatient Hospital Stay (HOSPITAL_COMMUNITY): Payer: PPO

## 2021-05-25 DIAGNOSIS — J411 Mucopurulent chronic bronchitis: Secondary | ICD-10-CM

## 2021-05-25 DIAGNOSIS — I639 Cerebral infarction, unspecified: Secondary | ICD-10-CM | POA: Diagnosis not present

## 2021-05-25 DIAGNOSIS — S82201G Unspecified fracture of shaft of right tibia, subsequent encounter for closed fracture with delayed healing: Secondary | ICD-10-CM | POA: Diagnosis not present

## 2021-05-25 DIAGNOSIS — J9621 Acute and chronic respiratory failure with hypoxia: Secondary | ICD-10-CM | POA: Diagnosis not present

## 2021-05-25 LAB — CBC
HCT: 27.1 % — ABNORMAL LOW (ref 36.0–46.0)
Hemoglobin: 8.5 g/dL — ABNORMAL LOW (ref 12.0–15.0)
MCH: 30.1 pg (ref 26.0–34.0)
MCHC: 31.4 g/dL (ref 30.0–36.0)
MCV: 96.1 fL (ref 80.0–100.0)
Platelets: 324 10*3/uL (ref 150–400)
RBC: 2.82 MIL/uL — ABNORMAL LOW (ref 3.87–5.11)
RDW: 14 % (ref 11.5–15.5)
WBC: 9.4 10*3/uL (ref 4.0–10.5)
nRBC: 0 % (ref 0.0–0.2)

## 2021-05-25 LAB — BASIC METABOLIC PANEL
Anion gap: 9 (ref 5–15)
BUN: 16 mg/dL (ref 8–23)
CO2: 33 mmol/L — ABNORMAL HIGH (ref 22–32)
Calcium: 9.2 mg/dL (ref 8.9–10.3)
Chloride: 96 mmol/L — ABNORMAL LOW (ref 98–111)
Creatinine, Ser: 1.29 mg/dL — ABNORMAL HIGH (ref 0.44–1.00)
GFR, Estimated: 42 mL/min — ABNORMAL LOW (ref >60)
Glucose, Bld: 135 mg/dL — ABNORMAL HIGH (ref 70–99)
Potassium: 4.1 mmol/L (ref 3.5–5.1)
Sodium: 138 mmol/L (ref 135–145)

## 2021-05-25 LAB — GLUCOSE, CAPILLARY
Glucose-Capillary: 149 mg/dL — ABNORMAL HIGH (ref 70–99)
Glucose-Capillary: 181 mg/dL — ABNORMAL HIGH (ref 70–99)
Glucose-Capillary: 135 mg/dL — ABNORMAL HIGH (ref 70–99)
Glucose-Capillary: 238 mg/dL — ABNORMAL HIGH (ref 70–99)

## 2021-05-25 MED ORDER — FUROSEMIDE 10 MG/ML IJ SOLN
40.0000 mg | Freq: Once | INTRAMUSCULAR | Status: AC
  Administered 2021-05-25: 40 mg via INTRAVENOUS
  Filled 2021-05-25: qty 4

## 2021-05-25 MED ORDER — TORSEMIDE 20 MG PO TABS
40.0000 mg | ORAL_TABLET | Freq: Every day | ORAL | Status: DC
  Administered 2021-05-25 – 2021-05-27 (×3): 40 mg via ORAL
  Filled 2021-05-25 (×3): qty 2

## 2021-05-25 MED ORDER — SENNA 8.6 MG PO TABS
1.0000 | ORAL_TABLET | Freq: Every day | ORAL | Status: DC
  Administered 2021-05-26 – 2021-05-27 (×2): 8.6 mg via ORAL
  Filled 2021-05-25 (×2): qty 1

## 2021-05-25 MED ORDER — OXYCODONE HCL 5 MG PO TABS
5.0000 mg | ORAL_TABLET | ORAL | Status: DC | PRN
Start: 2021-05-25 — End: 2021-05-28
  Administered 2021-05-26 – 2021-05-27 (×6): 5 mg via ORAL
  Filled 2021-05-25 (×7): qty 1

## 2021-05-25 NOTE — Evaluation (Signed)
Physical Therapy Evaluation Patient Details Name: Caitlin Oliver MRN: [id number] DOB: 06/21/1940 Today's Date: 05/25/2021  History of Present Illness  Pt is an 81 y.o. female admitted 05/12/21 after fall at home, denies hitting head or LOC; pt also notes she fell and hit her head 1 wk prior. Pt sustained R tibia/fibula fx. Transfer to ICU 1/28 with hypotension, tachycardia and encephalopathy. MRI 1/30 showed acute punctate white matter infarct L frontal lobe; chronic small vessel ischemic changes. S/p R tibial IMN on 2/7. PMH includes DM2, PAD, pulmonary HTN, COPD (wears 3L O2 at home).   Clinical Impression  Pt presents with an overall decrease in functional mobility secondary to above. PTA, pt independent, lives alone; niece lives nearby. Pt aware of LE sx, but was unaware she had a stroke. Educ re: RLE NWB precautions, positioning, therex, and importance of mobility. Today, pt able to initiate transfer training with RW and up to modA+2; heavy reliance on BUE support, dependent for standing ADL tasks. Of note, pt has been in bed since admission 1/27 with R tibial IMN not until 2/7; very impressed with amount of strength pt has maintained for being bedbound this long. Pt is an excellent candidate for intensive AIR-level therapies to maximize functional mobility and independence prior to return home. Will follow acutely to address established goals.   Recommendations for follow up therapy are one component of a multi-disciplinary discharge planning process, led by the attending physician.  Recommendations may be updated based on patient status, additional functional criteria and insurance authorization.  Follow Up Recommendations Acute inpatient rehab (3hours/day)    Assistance Recommended at Discharge Frequent or constant Supervision/Assistance  Patient can return home with the following  A lot of help with walking and/or transfers;A lot of help with bathing/dressing/bathroom;Assistance with  cooking/housework;Assist for transportation;Help with stairs or ramp for entrance    Equipment Recommendations Rolling walker (2 wheels);BSC/3in1;Wheelchair (measurements PT);Wheelchair cushion (measurements PT) (TBD)  Recommendations for Other Services    Rehab consult   Functional Status Assessment Patient has had a recent decline in their functional status and demonstrates the ability to make significant improvements in function in a reasonable and predictable amount of time.     Precautions / Restrictions Precautions Precautions: Fall;Other (comment) Precaution Comments: Wears 3L O2 baseline Restrictions Weight Bearing Restrictions: Yes RLE Weight Bearing: Non weight bearing      Mobility  Bed Mobility Overal bed mobility: Needs Assistance Bed Mobility: Supine to Sit     Supine to sit: Min assist     General bed mobility comments: MinA for RLE management to EOB    Transfers Overall transfer level: Needs assistance Equipment used: Rolling walker (2 wheels) Transfers: Sit to/from Stand, Bed to chair/wheelchair/BSC Sit to Stand: Mod assist, +2 safety/equipment Stand pivot transfers: Mod assist, +2 safety/equipment         General transfer comment: Initial modA for trunk elevation and stability; pt with significant difficulty scooting, unable to hop on LLE when attempted; stand pivot from bed>BSC, required pulling recliner up behind pt upon standing from [facility]    Ambulation/Gait                  Stairs            Wheelchair Mobility    Modified Rankin (Stroke Patients Only) Modified Rankin (Stroke Patients Only) Pre-Morbid Rankin Score: No symptoms Modified Rankin: Moderately severe disability (impairment more related to ortho injury than acute infarct)     Balance Overall balance assessment: Needs assistance  Sitting balance-Leahy Scale: Fair     Standing balance support: Bilateral upper extremity supported, During functional activity,  Reliant on assistive device for balance Standing balance-Leahy Scale: Poor Standing balance comment: heavy reliance on BUE support, requiring assist for pericare                             Pertinent Vitals/Pain Pain Assessment Pain Assessment: Faces Faces Pain Scale: Hurts little more Pain Location: LLE Pain Descriptors / Indicators: Discomfort, Grimacing, Guarding Pain Intervention(s): Monitored during session, Limited activity within patient's tolerance    Home Living Family/patient expects to be discharged to:: Private residence Living Arrangements: Alone Available Help at Discharge: Family;Available PRN/intermittently Type of Home: House Home Access: Level entry       Home Layout: One level Home Equipment: Conservation officer, nature (2 wheels);Rollator (4 wheels);Shower seat;Adaptive equipment Additional Comments: niece who lives local who works    Prior Function Prior Level of Function : Independent/Modified Independent                     Journalist, newspaper        Extremity/Trunk Assessment   Upper Extremity Assessment Upper Extremity Assessment: Generalized weakness    Lower Extremity Assessment Lower Extremity Assessment: Generalized weakness;RLE deficits/detail (reports some radiating symptoms into R buttocks, but has improved since sx; minimal tingling sensation) RLE Deficits / Details: s/p R tibial IMN; knee extension lacking full AROM    Cervical / Trunk Assessment Cervical / Trunk Assessment: Kyphotic  Communication   Communication: No difficulties  Cognition Arousal/Alertness: Awake/alert Behavior During Therapy: WFL for tasks assessed/performed Overall Cognitive Status: No family/caregiver present to determine baseline cognitive functioning Area of Impairment: Orientation, Attention, Memory, Following commands, Safety/judgement, Awareness, Problem solving                 Orientation Level: Disoriented to, Situation Current Attention  Level: Sustained, Selective Memory: Decreased short-term memory, Decreased recall of precautions Following Commands: Follows one step commands with increased time Safety/Judgement: Decreased awareness of safety Awareness: Intellectual, Emergent Problem Solving: Slow processing, Decreased initiation, Difficulty sequencing, Requires verbal cues General Comments: aware of leg fx and sx, unaware of stroke; keeping eyes closed almost entirety of session, reports she has never liked the light. requires reminder for RLE NWB precautions, improving recall wtih mobility; poor attention, difficulty multitasknig        General Comments General comments (skin integrity, edema, etc.): SpO2 down to 81% on 3L O2 [place] with activity (reliable pleth); significant increased time to recover to >/85% on 4L O2 [place] - pt reports SpO2 drops to 80s at home with activity. Pt with c/o consistent dizziness with upright activity; seated on BSC BP 133/65, post-standing BP 127/71, HR 90s-100s    Exercises General Exercises - Lower Extremity Long Arc Quad: AAROM, Right, Seated   Assessment/Plan    PT Assessment Patient needs continued PT services  PT Problem List Decreased strength;Decreased range of motion;Decreased activity tolerance;Decreased balance;Decreased mobility;Decreased cognition;Decreased knowledge of use of DME;Decreased safety awareness;Decreased knowledge of precautions;Cardiopulmonary status limiting activity;Pain       PT Treatment Interventions DME instruction;Gait training;Functional mobility training;Therapeutic activities;Therapeutic exercise;Balance training;Patient/family education;Wheelchair mobility training    PT Goals (Current goals can be found in the Care Plan section)  Acute Rehab PT Goals Patient Stated Goal: Regain independence PT Goal Formulation: With patient Time For Goal Achievement: 06/08/21 Potential to Achieve Goals: Good    Frequency Min 5X/week  Co-evaluation                AM-PAC PT "6 Clicks" Mobility  Outcome Measure Help needed turning from your back to your side while in a flat bed without using bedrails?: A Little Help needed moving from lying on your back to sitting on the side of a flat bed without using bedrails?: A Little Help needed moving to and from a bed to a chair (including a wheelchair)?: A Lot Help needed standing up from a chair using your arms (e.g., wheelchair or bedside chair)?: A Lot Help needed to walk in hospital room?: Total Help needed climbing 3-5 steps with a railing? : Total 6 Click Score: 12    End of Session Equipment Utilized During Treatment: Gait belt;Oxygen Activity Tolerance: Patient tolerated treatment well;Patient limited by fatigue Patient left: in chair;with call bell/phone within reach;with chair alarm set Nurse Communication: Mobility status PT Visit Diagnosis: Other abnormalities of gait and mobility (R26.89);Muscle weakness (generalized) (M62.81);Pain Pain - Right/Left: Right Pain - part of body: Leg    Time: 5631-4970 PT Time Calculation (min) (ACUTE ONLY): 36 min   Charges:   PT Evaluation $PT Eval Moderate Complexity: [place], PT, DPT Acute Rehabilitation Services  Pager [phone] Office [phone]  Derry Lory 05/25/2021, 11:41 AM

## 2021-05-25 NOTE — Progress Notes (Signed)
° °  Inpatient Rehab Admissions Coordinator : ? ?Per therapy recommendations, patient was screened for CIR candidacy by Riata Ikeda RN MSN.  At this time patient appears to be a potential candidate for CIR. I will place a rehab consult per protocol for full assessment. Please call me with any questions. ? ?Jaz Mallick RN MSN ?Admissions Coordinator ?[phone] ?  ?

## 2021-05-25 NOTE — Progress Notes (Addendum)
Speech Language Pathology Treatment: Cognitive-Linquistic  Patient Details Name: Caitlin Oliver MRN: [id number] DOB: October 29, 1940 Today's Date: 05/25/2021 Time: 3779-3968 SLP Time Calculation (min) (ACUTE ONLY): 9 min  Assessment / Plan / Recommendation Clinical Impression  Pt seen for limited treatment due to falling asleep and being unable to perform cognitive tasks after receiving pain meds. Attempted money management and she sorted bills independently and needed max assist to add simple pseudo money. Decreased verbalization of safety awareness and was reminded to use call bell if needed to use the bathroom. Unable to problem solve length of time in hospital with cues and not independently recalling whiteboard to write down important info. ST will continue efforts.    HPI HPI: 81 year old female with multiple medical problems admitted 1/27 with severe tib/fib fxs of RLE; planned IM fixation but declined medically with lethargy,  hypercarbic respiratory failure; placed on BiPAP and surgery was cancelled.  Current dx include shock, acute left frontal CVA, acute hypoxic respiratory failure.  PMHx significant for type 2 diabetes, carotid artery stenosis status post bilateral endarterectomies, extensive PAD, pulmonary hypertension, and COPD on 3 L of oxygen at home.  MRI with atrophy, chronic small vessel ischemic changes, acute punctate white matter infarct of L frontal lobe. Previous bedside swallow evaluation by SLP noted expressive aphasia with anomia, semantic and phonemic paraphasias, and awareness of deficits.      SLP Plan  Continue with current plan of care      Recommendations for follow up therapy are one component of a multi-disciplinary discharge planning process, led by the attending physician.  Recommendations may be updated based on patient status, additional functional criteria and insurance authorization.    Recommendations                   Oral Care Recommendations: Oral  care BID Follow Up Recommendations: Skilled nursing-short term rehab (<3 hours/day) Assistance recommended at discharge: Intermittent Supervision/Assistance SLP Visit Diagnosis: Cognitive communication deficit (R41.841);Aphasia (R47.01) Plan: Continue with current plan of care           Houston Siren  05/25/2021, 11:35 AM

## 2021-05-25 NOTE — TOC Initial Note (Signed)
Transition of Care [facility]) - Initial/Assessment Note    Patient Details  Name: Caitlin Oliver MRN: [id number] Date of Birth: 06-Aug-1940  Transition of Care [facility]) CM/SW Contact:    Joanne Chars, LCSW Phone Number: 05/25/2021, 4:10 PM  Clinical Narrative:  CSW met with pt after being informed pt and niece discussed CIR option.  Niece requesting SNF and pt is agreeable to this as well.  Choice document given, permission given to send out referral in hub.  Permission given to speak with niece Prince Solian.  Pt lives home alone, has home O2 through [facility].  Pt is vaccinated for covid with 2 boosters.  Referral sent out in hub for SNF.                  Expected Discharge Plan: Skilled Nursing Facility Barriers to Discharge: Continued Medical Work up, SNF Pending bed offer   Patient Goals and CMS Choice Patient states their goals for this hospitalization and ongoing recovery are:: "get out, be independent" CMS Medicare.gov Compare Post Acute Care list provided to:: Patient Choice offered to / list presented to : Patient  Expected Discharge Plan and Services Expected Discharge Plan: [place] In-house Referral: Clinical Social Work   Post Acute Care Choice: [place] Living arrangements for the past 2 months: [place]                                      Prior Living Arrangements/Services Living arrangements for the past 2 months: Single Family Home Lives with:: Self Patient language and need for interpreter reviewed:: Yes Do you feel safe going back to the place where you live?: Yes      Need for Family Participation in Patient Care: Yes (Comment) Care giver support system in place?: Yes (comment) Current home services: DME ([employer] [facility] for Home O[facility]) Criminal Activity/Legal Involvement Pertinent to Current Situation/Hospitalization: No - Comment as needed  Activities of Daily Living Home  Assistive Devices/Equipment: Walker (specify type) ADL Screening (condition at time of admission) Patient's cognitive ability adequate to safely complete daily activities?: Yes Is the patient deaf or have difficulty hearing?: No Does the patient have difficulty seeing, even when wearing glasses/contacts?: No Does the patient have difficulty concentrating, remembering, or making decisions?: No Patient able to express need for assistance with ADLs?: Yes Does the patient have difficulty dressing or bathing?: No Independently performs ADLs?: Yes (appropriate for developmental age) Does the patient have difficulty walking or climbing stairs?: Yes Weakness of Legs: Right Weakness of Arms/Hands: None  Permission Sought/Granted Permission sought to share information with : Family Supports Permission granted to share information with : Yes, Verbal Permission Granted  Share Information with NAME: niece [name] williams  Permission granted to share info w AGENCY: SNF        Emotional Assessment Appearance:: Appears stated age Attitude/Demeanor/Rapport: Engaged Affect (typically observed): Appropriate, Pleasant Orientation: : Oriented to Self, Oriented to Place, Oriented to  Time, Oriented to Situation Alcohol / Substance Use: Not Applicable Psych Involvement: No (comment)  Admission diagnosis:  Fall [W19.XXXA] Tibia/fibula fracture [id number], S82.409A] Patient Active Problem List   Diagnosis Date Noted   Delirium 05/15/2021   Acute on chronic respiratory failure with hypoxia and hypercapnia (HCC) 05/15/2021   Tibia/fibula fracture 05/12/2021   Acute on chronic heart failure with preserved ejection fraction (HFpEF) ([place])    Cancer of left  lung ([place]) 02/03/2016   Solitary pulmonary nodule 12/08/2015   Subclavian steal syndrome 10/28/2015   COPD 10/18/2013   Hypoxia 10/10/2013   PVD (peripheral vascular disease) with claudication ([place]) 07/29/2013   Occlusion and stenosis of carotid artery  without mention of cerebral infarction 03/02/2013   Carotid artery obstruction 03/02/2013   Tobacco abuse 02/27/2013   Essential hypertension 01/20/2013   Type 2 diabetes mellitus ([place]) 01/20/2013   Hyperlipidemia 01/20/2013   PCP:  [name], PA-C Pharmacy:   [place], [place] - [address] [place] [address] Phone: [phone] Fax: [place], [place] - [address] [place] [address] Phone: [phone] Fax: [phone]     Social Determinants of Health (SDOH) Interventions    Readmission Risk Interventions Readmission Risk Prevention Plan 05/15/2021 11/17/2018  Transportation Screening Complete Complete  PCP or Specialist Appt within 3-5 Days Not Complete Not Complete  Not Complete comments not ready for d/c not ready for dc  HRI or [place] Complete Complete  Social Work Consult for [place] Planning/Counseling Complete Complete  Palliative Care Screening Not Applicable Not Applicable  Medication Review (RN Care Manager) Referral to Pharmacy Patient Refused  Some recent data might be hidden

## 2021-05-25 NOTE — Progress Notes (Addendum)
Inpatient Rehabilitation Admissions Coordinator   I met at bedside with patient to discuss her rehab options. She states she lives alone and for me to call her niece, [name]. She acknowledges that she does not have 24/7 assist. I have left a message for [name] to call me but patient likely to need SNF. I will notify acute team and TOC.  Danne Baxter, RN, MSN Rehab Admissions Coordinator [phone] 05/25/2021 2:39 PM  [name] called back and we discussed rehab options. Patient does not have the needed caregiver supports after a CIR admit. She is requesting SNF. I will alert acute team and TOC. We will sign off.  Danne Baxter, RN, MSN Rehab Admissions Coordinator [phone] 05/25/2021 2:52 PM

## 2021-05-25 NOTE — NC FL2 (Signed)
[place] LEVEL OF CARE SCREENING TOOL     IDENTIFICATION  Patient Name: Caitlin Oliver Birthdate: Aug 21, 1940 Sex: female Admission Date (Current Location): 05/12/2021  [facility] and [place] Number:  Herbalist and Address:  [facility] [place], [place] [address] [place], [place] [address]      Provider Number: [id number]  Attending Physician Name and Address:  Martyn Malay, MD  Relative Name and Phone Number:  Prince Solian Niece [phone]    Current Level of Care: Hospital Recommended Level of Care: [place] Prior Approval Number:    Date Approved/Denied:   PASRR Number: [id number]  Discharge Plan: SNF    Current Diagnoses: Patient Active Problem List   Diagnosis Date Noted   Delirium 05/15/2021   Acute on chronic respiratory failure with hypoxia and hypercapnia ([place]) 05/15/2021   Tibia/fibula fracture 05/12/2021   Acute on chronic heart failure with preserved ejection fraction (HFpEF) ([place])    Cancer of left lung ([place]) 02/03/2016   Solitary pulmonary nodule 12/08/2015   Subclavian steal syndrome 10/28/2015   COPD 10/18/2013   Hypoxia 10/10/2013   PVD (peripheral vascular disease) with claudication ([place]) 07/29/2013   Occlusion and stenosis of carotid artery without mention of cerebral infarction 03/02/2013   Carotid artery obstruction 03/02/2013   Tobacco abuse 02/27/2013   Essential hypertension 01/20/2013   Type 2 diabetes mellitus ([place]) 01/20/2013   Hyperlipidemia 01/20/2013    Orientation RESPIRATION BLADDER Height & Weight     Self, Time, Situation, Place  O2 Continent, External catheter Weight: 174 lb 13.2 oz (79.3 kg) Height:  _0  (162.6 cm)  BEHAVIORAL SYMPTOMS/MOOD NEUROLOGICAL BOWEL NUTRITION STATUS      Continent Diet (see discharge summary)  AMBULATORY STATUS COMMUNICATION OF NEEDS Skin   Total Care Verbally Surgical wounds                       Personal Care Assistance Level  of Assistance  Bathing, Feeding, Dressing Bathing Assistance: Maximum assistance Feeding assistance: Limited assistance Dressing Assistance: Maximum assistance     Functional Limitations Info  Sight, Hearing, Speech Sight Info: Adequate Hearing Info: Adequate Speech Info: Adequate    SPECIAL CARE FACTORS FREQUENCY  PT (By licensed PT), OT (By licensed OT)     PT Frequency: 5x week OT Frequency: 5x week            Contractures Contractures Info: Not present    Additional Factors Info  Code Status, Allergies, Insulin Sliding Scale Code Status Info: DNR Allergies Info: Fentanyl   Insulin Sliding Scale Info: Novolog, 0-15 units 3x day with meals, 0-5 units qhs.  See discharge summary.       Current Medications (05/25/2021):  This is the current hospital active medication list Current Facility-Administered Medications  Medication Dose Route Frequency Provider Last Rate Last Admin   acetaminophen (TYLENOL) tablet 650 mg  650 mg Oral Q6H Ainsley Spinner, PA-C   650 mg at 05/25/21 5009   albuterol (PROVENTIL) (2.5 MG/3ML) 0.083% nebulizer solution 2.5 mg  2.5 mg Inhalation Q6H PRN Collene Gobble, MD       ascorbic acid (VITAMIN C) tablet 1,000 mg  1,000 mg Oral Daily Collene Gobble, MD   1,000 mg at 05/25/21 3818   aspirin EC tablet 81 mg  81 mg Oral Daily Ainsley Spinner, PA-C   81 mg at 05/25/21 2993   atorvastatin (LIPITOR) tablet 40 mg  40 mg Oral Daily Eddie Dibbles,  Lanny Hurst, PA-C   40 mg at 05/25/21 2549   chlorhexidine (PERIDEX) 0.12 % solution 15 mL  15 mL Mouth Rinse BID Collene Gobble, MD   15 mL at 05/25/21 0810   Chlorhexidine Gluconate Cloth 2 % PADS 6 each  6 each Topical Daily Collene Gobble, MD   6 each at 05/25/21 8264   cholecalciferol (VITAMIN D3) tablet 2,000 Units  2,000 Units Oral BID Ainsley Spinner, PA-C   2,000 Units at 05/25/21 1583   clopidogrel (PLAVIX) tablet 75 mg  75 mg Oral Daily Gerrit Heck, MD   75 mg at 05/25/21 0809   docusate (COLACE) 50 MG/5ML liquid 100 mg   100 mg Oral Daily Ainsley Spinner, PA-C   100 mg at 05/25/21 0808   enoxaparin (LOVENOX) injection 40 mg  40 mg Subcutaneous Q24H Gerrit Heck, MD   40 mg at 05/25/21 0809   feeding supplement (GLUCERNA SHAKE) (GLUCERNA SHAKE) liquid 237 mL  237 mL Oral BID BM Ainsley Spinner, PA-C   237 mL at 05/25/21 0811   FLUoxetine (PROZAC) capsule 20 mg  20 mg Oral Daily Collene Gobble, MD   20 mg at 05/25/21 0940   furosemide (LASIX) injection 40 mg  40 mg Intravenous Once Ganta, Anupa, DO       insulin aspart (novoLOG) injection 0-15 Units  0-15 Units Subcutaneous TID WC Collene Gobble, MD   3 Units at 05/25/21 1208   insulin aspart (novoLOG) injection 0-5 Units  0-5 Units Subcutaneous QHS Collene Gobble, MD   2 Units at 05/24/21 2113   magnesium oxide (MAG-OX) tablet 200 mg  200 mg Oral Daily Collene Gobble, MD   200 mg at 05/25/21 7680   MEDLINE mouth rinse  15 mL Mouth Rinse q12n4p Collene Gobble, MD   15 mL at 05/25/21 1209   metoprolol succinate (TOPROL-XL) 24 hr tablet 12.5 mg  12.5 mg Oral Daily Erskine Emery, MD   12.5 mg at 05/25/21 8811   multivitamin with minerals tablet 1 tablet  1 tablet Oral Daily Ainsley Spinner, PA-C   1 tablet at 05/25/21 0807   ondansetron (ZOFRAN) injection 4 mg  4 mg Intravenous Q6H PRN Collene Gobble, MD   4 mg at 05/25/21 1020   oxyCODONE (Oxy IR/ROXICODONE) immediate release tablet 5 mg  5 mg Oral Q4H PRN Eppie Gibson, MD       pantoprazole (PROTONIX) EC tablet 40 mg  40 mg Oral BID Collene Gobble, MD   40 mg at 05/25/21 0315   protein supplement (ENSURE MAX) liquid  11 oz Oral Daily Ainsley Spinner, PA-C   11 oz at 05/24/21 9458   revefenacin (YUPELRI) nebulizer solution 175 mcg  175 mcg Nebulization Daily Collene Gobble, MD   175 mcg at 05/25/21 0821   [START ON 05/26/2021] senna (SENOKOT) tablet 8.6 mg  1 tablet Oral Daily Eppie Gibson, MD       torsemide [facility]) tablet 40 mg  40 mg Oral Daily Ganta, Anupa, DO   40 mg at 05/25/21 5929   zinc sulfate capsule  220 mg  220 mg Oral Daily Ainsley Spinner, PA-C   220 mg at 05/25/21 2446     Discharge Medications: Please see discharge summary for a list of discharge medications.  Relevant Imaging Results:  Relevant Lab Results:   Additional Information SSN: [id number]. Pt is vaccinated for covid with 2 boosters.  Joanne Chars, LCSW

## 2021-05-25 NOTE — Progress Notes (Signed)
Spoke with Dr. Leonel Ramsay with neurology regarding recent CVA.  He suspects this was an incidental finding but recommends dual antiplatelet therapy for 3 weeks from the date of the MRI, then continuing on monotherapy with her home clopidogrel.  She can be continued on ASA longer if desired by orthopedics.  Dual antiplatelet therapy can also be discontinued earlier if clinically indicated.  Plan for now will be to continue dual antiplatelet therapy until 2/20.

## 2021-05-25 NOTE — Progress Notes (Signed)
Family Medicine Teaching Service Daily Progress Note Intern Pager: [phone]  Patient name: Caitlin Oliver Medical record number: [id number] Date of birth: Aug 23, 1940 Age: 81 y.o. Gender: female  Primary Care Provider: [name], PA-C Consultants: PCCM, neuro, Ortho Code Status: DNR  Pt Overview and Major Events to Date:  1/27 admitted to [facility] PTS 1/28 found to be hypotensive, tachycardic, encephalopathic and febrile-transferred to ICU 1/29 on BiPAP in ICU CT head no acute abnormalities 1/30 MRI brain showed atrophy/chronic small vessel ischemic infarct of left frontal lobe 1/30 on Precedex/Ativan/Haldol for agitation 1/31 transfer to [place] from ICU 2/7 surgery on right tibia/fib fracture    Assessment and Plan:  Iysis Caitlin Oliver is an 81 year old female who presented with a fall secondary to syncopal versus mechanical fall resulting in right tibia and fibula closed fracture.  Past medical history significant for type 2 diabetes, carotid artery stenosis, status post bilateral endarterectomies, extensive PAD, pulmonary hypertension and COPD.  Chronic hypoxemic respiratory failure on 3 L O2 baseline   Tachypnea  This morning breathing is improved this morning.  Patient was saturating 92 to 100% on 3 L nasal cannula yesterday.  Chest x-ray yesterday showed some pulmonary edema and small effusion.  WBC is 9.4 to 9.4. UOP 2.1 L -keep oxygen saturations 88-92% -BiPAP overnight -Torsemide dose 40 mg this morning, tomorrow consider going back to home 40 mg bid -Monitor urine output  Right tib-fib closed fracture POD 3 intramedullary nail  This morning right leg appeared well perfused. -Ortho following, appreciate care and recommendations -oxycodone 5 mg every 4 hours as needed -Tylenol 650 every 6 hours scheduled -PT following, appreciate recommendations  Acute metabolic encephalopathy, resolved Patient this morning is alert and oriented x4.  BiPAP was used last night -Encourage  consistent BiPAP use at night -Monitor mental status  Chronic diastolic heart failure EF 65 to 70%   severe PAH On examination patient appeared 1+ pitting edema on left lower extremity to the knee.  Heart rates have been 76-88 -Metoprolol 12.5 mg daily -Home torsemide 40 mg daily  Abdominal pain   constipation  This morning patient's abdomen was nontender and soft to palpation. Lovenox -Monitor -MiraLAX twice daily -Senna daily -Colace daily  Acute left frontal CVA Neurology recommended dual antiplatelet therapy for 3 weeks from the date of the MRI and then continuing on monotherapy with home Plavix. -DAPT to end 2/20  T2DM CBGs have been 135 to 238.  Received 9 short acting yesterday. -CBG monitoring -go to [facility]  CKD 3a Creatinine increased today is 1.42 from 1.29 yesterday. -Monitor while on diuretics  Decreased p.o. intake -Ensure, Glucerna, MVI  FEN/GI: Heart healthy PPx: Lovenox Dispo: SNF medically stable and bed availability  Subjective:  No issues as she complained of currently  Objective: Temp:  [97.7 F (36.5 C)-99 F (37.2 C)] 98.2 F (36.8 C) (02/09 1433) Pulse Rate:  [79-93] 88 (02/09 1433) Resp:  [14-18] 18 (02/09 1433) BP: (105-164)/(49-83) 105/60 (02/09 1433) SpO2:  [96 %-100 %] 97 % (02/09 1433) Physical Exam: General: NAD, laying in bed comfortably, alert and oriented x4 and responsive to questions Cardiovascular: RRR no murmurs rubs or gallops Respiratory: Clear to auscultation bilaterally no wheezes rales or crackles Abdomen: Nontender to palpation, soft Extremities: 1+ pitting edema to knee on left side  Laboratory: Recent Labs  Lab 05/23/21 1837 05/24/21 0201 05/25/21 0303  WBC 12.2* 15.7* 9.4  HGB 10.0* 9.7* 8.5*  HCT 30.5* 29.3* 27.1*  PLT 371 388 324   Recent  Labs  Lab 05/23/21 1837 05/24/21 0201 05/25/21 0303  NA 140 139 138  K 4.5 4.3 4.1  CL 99 96* 96*  CO2 32 27 33*  BUN _0 CREATININE 1.17* 1.32* 1.29*   CALCIUM 9.3 9.0 9.2  GLUCOSE 198* 193* 135*      Imaging/Diagnostic Tests: No results found.   Gerrit Heck, MD 05/25/2021, 3:07 PM PGY-1, [place] Intern pager: [phone], text pages welcome

## 2021-05-25 NOTE — Evaluation (Signed)
Occupational Therapy Evaluation Patient Details Name: Caitlin Oliver MRN: [id number] DOB: December 29, 1940 Today's Date: 05/25/2021   History of Present Illness Pt is an 81 y.o. female admitted 05/12/21 after fall at home, denies hitting head or LOC; pt also notes she fell and hit her head 1 wk prior. Pt sustained R tibia/fibula fx. Transfer to ICU 1/28 with hypotension, tachycardia and encephalopathy. MRI 1/30 showed acute punctate white matter infarct L frontal lobe; chronic small vessel ischemic changes. S/p R tibial IMN on 2/7. PMH includes DM2, PAD, pulmonary HTN, COPD (wears 3L O2 at home).   Clinical Impression   Pt admitted with the above diagnoses and presents with below problem list. Pt will benefit from continued acute OT to address the below listed deficits and maximize independence with basic ADLs prior to d/c to next venue. At baseline, pt lives alone, uses a rollator; her niece and nephew live nearby. Pt currently needs mod -max A +2 for LB ADLs in sit<>stand and for SPT to access BSC. Pt needing multimodal cues for NWB RLE and struggling a bit with this. On second stand pt able to demonstrate the ability raise her R foot a couple of inches and maintain for a few seconds. Of note, pt reporting sensitivity to light and needing additional blinks to track horizontally. She also presents with impaired cognition though baseline is unclear.       Recommendations for follow up therapy are one component of a multi-disciplinary discharge planning process, led by the attending physician.  Recommendations may be updated based on patient status, additional functional criteria and insurance authorization.   Follow Up Recommendations  Acute inpatient rehab (3hours/day)    Assistance Recommended at Discharge Frequent or constant Supervision/Assistance  Patient can return home with the following      Functional Status Assessment  Patient has had a recent decline in their functional status and  demonstrates the ability to make significant improvements in function in a reasonable and predictable amount of time.  Equipment Recommendations  Other (comment) (defer to next venue)    Recommendations for Other Services Rehab consult     Precautions / Restrictions Precautions Precautions: Fall;Other (comment) Precaution Comments: Wears 3L O2 baseline Restrictions Weight Bearing Restrictions: Yes RLE Weight Bearing: Non weight bearing      Mobility Bed Mobility Overal bed mobility: Needs Assistance Bed Mobility: Supine to Sit     Supine to sit: Min assist     General bed mobility comments: MinA for RLE management to EOB    Transfers Overall transfer level: Needs assistance Equipment used: Rolling walker (2 wheels) Transfers: Sit to/from Stand, Bed to chair/wheelchair/BSC Sit to Stand: Mod assist, +2 safety/equipment Stand pivot transfers: Mod assist, +2 safety/equipment         General transfer comment: Initial modA for trunk elevation and stability; pt with significant difficulty scooting, unable to hop on LLE when attempted; stand pivot from bed>BSC, required pulling recliner up behind pt upon standing from [facility]      Balance Overall balance assessment: Needs assistance   Sitting balance-Leahy Scale: Fair     Standing balance support: Bilateral upper extremity supported, During functional activity, Reliant on assistive device for balance Standing balance-Leahy Scale: Poor Standing balance comment: heavy reliance on BUE support, requiring assist for pericare                           ADL either performed or assessed with clinical judgement   ADL Overall  ADL's : Needs assistance/impaired Eating/Feeding: Set up;Sitting   Grooming: Set up;Sitting   Upper Body Bathing: Set up;Sitting   Lower Body Bathing: +2 for physical assistance;Sit to/from stand;Maximal assistance;Moderate assistance   Upper Body Dressing : Set up;Sitting   Lower Body  Dressing: Moderate assistance;Maximal assistance;+2 for physical assistance;Sit to/from stand   Toilet Transfer: Moderate assistance;+2 for physical assistance;Stand-pivot;BSC/3in1;Rolling walker (2 wheels) Toilet Transfer Details (indicate cue type and reason): EOB to [facility] on pt's left side Toileting- Clothing Manipulation and Hygiene: Moderate assistance;+2 for physical assistance;Sit to/from stand         General ADL Comments: Pt currently needs physical assist of 2 for functional transfers. Pt also limited by impaired cognition (baseline?).     Vision Baseline Vision/History: 1 Wears glasses (s/p cataract surgery "March?") Patient Visual Report: Eye fatigue/eye pain/headache Vision Assessment?: Yes Eye Alignment: Within Functional Limits Ocular Range of Motion: Within Functional Limits Alignment/Gaze Preference: Within Defined Limits Tracking/Visual Pursuits: Decreased smoothness of horizontal tracking;Requires cues, head turns, or add eye shifts to track Visual Fields: No apparent deficits Additional Comments: Pt reporting she does not like the bright lights of the room or the light from outside. Requesting to keep the room dim. She reports this has been going on for a couple of years but question the reliablility of her history and no family present to corroborate. needs further assessment in functinoal context. further assess depth perception.     Perception     Praxis      Pertinent Vitals/Pain Pain Assessment Faces Pain Scale: Hurts little more Pain Location: LLE Pain Descriptors / Indicators: Discomfort, Grimacing, Guarding Pain Intervention(s): Limited activity within patient's tolerance, Monitored during session, Patient requesting pain meds-RN notified, Repositioned     Hand Dominance Right   Extremity/Trunk Assessment Upper Extremity Assessment Upper Extremity Assessment: Generalized weakness   Lower Extremity Assessment Lower Extremity Assessment: Defer to PT  evaluation RLE Deficits / Details: s/p R tibial IMN; knee extension lacking full AROM   Cervical / Trunk Assessment Cervical / Trunk Assessment: Kyphotic   Communication Communication Communication: No difficulties   Cognition Arousal/Alertness: Awake/alert Behavior During Therapy: WFL for tasks assessed/performed Overall Cognitive Status: No family/caregiver present to determine baseline cognitive functioning Area of Impairment: Orientation, Attention, Memory, Following commands, Safety/judgement, Awareness, Problem solving                 Orientation Level: Disoriented to, Situation Current Attention Level: Sustained, Selective Memory: Decreased short-term memory, Decreased recall of precautions Following Commands: Follows one step commands with increased time Safety/Judgement: Decreased awareness of safety, Decreased awareness of deficits Awareness: Intellectual, Emergent Problem Solving: Slow processing, Decreased initiation, Difficulty sequencing, Requires verbal cues General Comments: aware of leg fx and sx, unaware of stroke; keeping eyes closed almost entirety of session, reports she has never liked the light. requires reminder for RLE NWB precautions, improving recall wtih mobility; poor attention, difficulty multitasknig     General Comments  SpO2 down to 81% on 3L O2 [place] with activity (reliable pleth); significant increased time to recover to >/85% on 4L O2 [place] - pt reports SpO2 drops to 80s at home with activity. Pt with c/o consistent dizziness with upright activity; seated on BSC BP 133/65, post-standing BP 127/71, HR 90s-100s    Exercises     Shoulder Instructions      Home Living Family/patient expects to be discharged to:: Private residence Living Arrangements: Alone Available Help at Discharge: Family;Available PRN/intermittently Type of Home: House Home Access: Level entry  Home Layout: One level     Bathroom Shower/Tub: Radiographer, therapeutic: Handicapped height Bathroom Accessibility: Yes How Accessible: Accessible via walker;Accessible via wheelchair Home Equipment: [place] (2 wheels);Rollator (4 wheels);Shower seat;Adaptive equipment Adaptive Equipment: [place] Additional Comments: niece and nephew who live local, both work      Prior Functioning/Environment Prior Level of Function : Independent/Modified Independent             Mobility Comments: rollator          OT Problem List: Decreased strength;Decreased activity tolerance;Impaired balance (sitting and/or standing);Decreased cognition;Decreased safety awareness;Decreased knowledge of use of DME or AE;Decreased knowledge of precautions;Pain      OT Treatment/Interventions: Self-care/ADL training;Therapeutic exercise;DME and/or AE instruction;Therapeutic activities;Patient/family education;Balance training;Cognitive remediation/compensation    OT Goals(Current goals can be found in the care plan section) Acute Rehab OT Goals Patient Stated Goal: agreeable to further rehab at d/c OT Goal Formulation: With patient Time For Goal Achievement: 06/08/21 Potential to Achieve Goals: Good ADL Goals Pt Will Perform Lower Body Bathing: with min assist;sit to/from stand Pt Will Perform Lower Body Dressing: with min assist Pt Will Transfer to Toilet: with min assist;ambulating;stand pivot transfer;bedside commode Pt Will Perform Toileting - Clothing Manipulation and hygiene: with min assist;sit to/from stand  OT Frequency: Min 2X/week    Co-evaluation PT/OT/SLP Co-Evaluation/Treatment: Yes Reason for Co-Treatment: Necessary to address cognition/behavior during functional activity;For patient/therapist safety;To address functional/ADL transfers   OT goals addressed during session: ADL's and self-care;Strengthening/ROM;Proper use of Adaptive equipment and DME      AM-PAC OT "6 Clicks" Daily Activity     Outcome Measure Help from another person eating  meals?: None Help from another person taking care of personal grooming?: A Little Help from another person toileting, which includes using toliet, bedpan, or urinal?: Total Help from another person bathing (including washing, rinsing, drying)?: A Lot Help from another person to put on and taking off regular upper body clothing?: Total Help from another person to put on and taking off regular lower body clothing?: A Little 6 Click Score: 14   End of Session Equipment Utilized During Treatment: Rolling walker (2 wheels) Nurse Communication: Mobility status;Patient requests pain meds  Activity Tolerance: Patient limited by fatigue Patient left: in chair;with call bell/phone within reach;with chair alarm set  OT Visit Diagnosis: Unsteadiness on feet (R26.81);Pain;Muscle weakness (generalized) (M62.81);History of falling (Z91.81);Other symptoms and signs involving cognitive function                Time: 3154-0086 OT Time Calculation (min): 36 min Charges:  OT General Charges $OT Visit: 1 Visit OT Evaluation $OT Eval Moderate Complexity: [place], OT Acute Rehabilitation Services Office: [phone]   Hortencia Pilar 05/25/2021, 12:15 PM

## 2021-05-25 NOTE — Progress Notes (Signed)
Family Medicine Teaching Service Daily Progress Note Intern Pager: [phone]  Patient name: Caitlin Oliver Medical record number: [id number] Date of birth: 1940-04-23 Age: 81 y.o. Gender: female  Primary Care Provider: [name], PA-C Consultants: PCCM, neuro, Ortho Code Status: DNR  Pt Overview and Major Events to Date:  1/27 admitted to [facility] 1/28 found to be hypotensive, tachycardic, encephalopathic and febrile-transferred to ICU 1/29 on BiPAP in ICU CT head no acute abnormalities 1/30 MRI brain showed atrophy/chronic small vessel ischemic infarct of left frontal lobe 1/30 on Precedex/Ativan/Haldol for agitation 1/31 transfer to [place] from ICU 2/7 surgery on right tibia/fib fracture  Assessment and Plan:  Caitlin Oliver is an 81 year old female who presented with a fall secondary to syncopal versus mechanical fall resulting in right tibia and fibula closed fracture.  Past medical history significant for type 2 diabetes, carotid artery stenosis, status post bilateral endarterectomies, extensive PAD, pulmonary hypertension and COPD.  Chronic hypoxemic respiratory failure on 3L O2 baseline   Tachycardia Had moderate increased work of breathing on examination and had to sit upright in order to breathe better.  Saturation in high 90s overnight.  Was not connected to pulse oximetry in room.  Did have tachycardia yesterday morning to 110s to 113 but this is since resolved to the 80s to 90s heart rates.  Differentials include fluid overload from [facility] given patient has not received torsemide in a while and required sitting up to help with shortness of breath.  Also 1+ pitting edema to knee on the left side.  Pulmonary embolism less likely given patient does not have any chest pain and heart rates have stabilized.  Pneumonia possibility however patient has not had any fevers currently but did have productive sounding cough.  Has history of COPD so could be related to this as well given cough but  will order chest x-ray to evaluate for these differentials. -Keep oxygen sats 88-92% -BiPAP overnight -torsemide dose 40 mg this morning -CXR -PM check of respiratory status, consider 40 IV Lasix in p.m. -Monitor UOP  Right tib-fib closed fracture POD 2 intramedullary nail left  This morning right leg wrapped and seemed perfused with brisk capillary refill. -Ortho following, appreciate care recommendations -Switch dilaudid to oxycodone 5 mg q4h prn -Tylenol 650 every 6 hours scheduled -PT following, appreciate recs  Acute metabolic encephalopathy, resolved Patient this morning is alert and oriented x4.  BiPAP last night not charted for for a few hours. -encourage consistent BiPAP use at night -monitor mental status  Chronic diastolic heart failure EF 65 to 70%   severe PAH  Heart rates were 80s to 90s this morning.  On examination patient appeared fluid overloaded on lung examination as well as 1+ pitting edema on left lower extremity to knee. -Monitor heart rates -Metoprolol 12.5 mg -d/c Metolazone 5 mg daily  -PM check for respiratory status -home torsemide at 40 mg daily  Abdominal pain   constipation This morning patient did not have any abdominal pain when palpating.  KUB did not show large stool burden or constipation. 1 BM charted. -D/c'd heparin, restarted lovenox -monitor -miralax BID -senna BID to daily  -Colace daily  Acute left frontal CVA -restart plavix -continue aspirin -Will reach out to neurology on recommendations on how long to continue DAPT  T2DM CBGs have been 133 to 211.  Received 10 units short acting yesterday. -CBG monitoring -consdier long acting tomorrow -sSSI  CKD 3A Creatinine today is 1.29 from 1.32.  Around baseline -monitor  Decreased PO intake -ensure, glucerna, MVI  FEN/GI: Heart healthy PPx: Lovenox Dispo: Pending clinical improvement/PT recs  Subjective:  Patient feels like her breathing was difficult this morning.  Denied  any chest pain.  Had to sit up all the way in bed to breathe well.  Is interested in SNF after hospitalization.  Discussed that she would have to stay until they found bed placement for her.  Objective: Temp:  [97.9 F (36.6 C)-99 F (37.2 C)] 98 F (36.7 C) (02/09 0422) Pulse Rate:  [79-99] 88 (02/09 0422) Resp:  [14-18] 18 (02/09 0422) BP: (108-135)/(49-71) 135/69 (02/09 0422) SpO2:  [96 %-100 %] 96 % (02/09 0422) Physical Exam: General: Alert and oriented x4, dyspneic, nontoxic Cardiovascular: RRR no murmurs rubs or gallops Respiratory: Coarse breath sounds throughout, moderate increased work of breathing on 3 L, Abdomen: Soft, nontender to palpation, normoactive bowel sounds Extremities: Right leg wrapped in Ace with brisk capillary refill, left leg with 1+ pitting edema to knee  Laboratory: Recent Labs  Lab 05/23/21 1837 05/24/21 0201 05/25/21 0303  WBC 12.2* 15.7* 9.4  HGB 10.0* 9.7* 8.5*  HCT 30.5* 29.3* 27.1*  PLT 371 388 324   Recent Labs  Lab 05/23/21 1837 05/24/21 0201 05/25/21 0303  NA 140 139 138  K 4.5 4.3 4.1  CL 99 96* 96*  CO2 32 27 33*  BUN _0 CREATININE 1.17* 1.32* 1.29*  CALCIUM 9.3 9.0 9.2  GLUCOSE 198* 193* 135*      Imaging/Diagnostic Tests:   Gerrit Heck, MD 05/25/2021, 6:57 AM PGY-1, [place] Intern pager: [phone], text pages welcome

## 2021-05-25 NOTE — Progress Notes (Signed)
Placed patient on BIPAP for HS use. No distress noted at this time. RN aware.

## 2021-05-26 DIAGNOSIS — E43 Unspecified severe protein-calorie malnutrition: Secondary | ICD-10-CM | POA: Insufficient documentation

## 2021-05-26 DIAGNOSIS — J9622 Acute and chronic respiratory failure with hypercapnia: Secondary | ICD-10-CM | POA: Diagnosis not present

## 2021-05-26 DIAGNOSIS — J9621 Acute and chronic respiratory failure with hypoxia: Secondary | ICD-10-CM | POA: Diagnosis not present

## 2021-05-26 DIAGNOSIS — S82201G Unspecified fracture of shaft of right tibia, subsequent encounter for closed fracture with delayed healing: Secondary | ICD-10-CM | POA: Diagnosis not present

## 2021-05-26 DIAGNOSIS — I639 Cerebral infarction, unspecified: Secondary | ICD-10-CM | POA: Diagnosis not present

## 2021-05-26 LAB — BASIC METABOLIC PANEL
Anion gap: 8 (ref 5–15)
BUN: 17 mg/dL (ref 8–23)
CO2: 36 mmol/L — ABNORMAL HIGH (ref 22–32)
Calcium: 9.2 mg/dL (ref 8.9–10.3)
Chloride: 94 mmol/L — ABNORMAL LOW (ref 98–111)
Creatinine, Ser: 1.42 mg/dL — ABNORMAL HIGH (ref 0.44–1.00)
GFR, Estimated: 37 mL/min — ABNORMAL LOW (ref >60)
Glucose, Bld: 164 mg/dL — ABNORMAL HIGH (ref 70–99)
Potassium: 3.9 mmol/L (ref 3.5–5.1)
Sodium: 138 mmol/L (ref 135–145)

## 2021-05-26 LAB — CBC
HCT: 27.2 % — ABNORMAL LOW (ref 36.0–46.0)
Hemoglobin: 8.7 g/dL — ABNORMAL LOW (ref 12.0–15.0)
MCH: 30.7 pg (ref 26.0–34.0)
MCHC: 32 g/dL (ref 30.0–36.0)
MCV: 96.1 fL (ref 80.0–100.0)
Platelets: 360 10*3/uL (ref 150–400)
RBC: 2.83 MIL/uL — ABNORMAL LOW (ref 3.87–5.11)
RDW: 14 % (ref 11.5–15.5)
WBC: 9.4 10*3/uL (ref 4.0–10.5)
nRBC: 0.4 % — ABNORMAL HIGH (ref 0.0–0.2)

## 2021-05-26 LAB — GLUCOSE, CAPILLARY
Glucose-Capillary: 173 mg/dL — ABNORMAL HIGH (ref 70–99)
Glucose-Capillary: 198 mg/dL — ABNORMAL HIGH (ref 70–99)
Glucose-Capillary: 195 mg/dL — ABNORMAL HIGH (ref 70–99)
Glucose-Capillary: 125 mg/dL — ABNORMAL HIGH (ref 70–99)

## 2021-05-26 MED ORDER — SENNA 8.6 MG PO TABS: 1.0000 | ORAL_TABLET | Freq: Every day | ORAL | 0 refills | Status: DC

## 2021-05-26 MED ORDER — PROSOURCE PLUS PO LIQD: 30.0000 mL | Freq: Two times a day (BID) | ORAL | Status: DC

## 2021-05-26 MED ORDER — ZINC SULFATE 220 (50 ZN) MG PO CAPS: 220.0000 mg | ORAL_CAPSULE | Freq: Every day | ORAL | Status: DC

## 2021-05-26 MED ORDER — ASPIRIN 81 MG PO TBEC: 81.0000 mg | DELAYED_RELEASE_TABLET | Freq: Every day | ORAL | Status: DC

## 2021-05-26 MED ORDER — ATORVASTATIN CALCIUM 40 MG PO TABS: 40.0000 mg | ORAL_TABLET | Freq: Every day | ORAL | Status: DC

## 2021-05-26 MED ORDER — PROSOURCE PLUS PO LIQD
30.0000 mL | Freq: Two times a day (BID) | ORAL | Status: DC
  Administered 2021-05-27: 30 mL via ORAL
  Filled 2021-05-26: qty 30

## 2021-05-26 MED ORDER — CHLORHEXIDINE GLUCONATE 0.12 % MT SOLN: 15.0000 mL | Freq: Two times a day (BID) | OROMUCOSAL | 0 refills | Status: DC

## 2021-05-26 MED ORDER — METOPROLOL SUCCINATE ER 25 MG PO TB24: 12.5000 mg | ORAL_TABLET | Freq: Every day | ORAL | Status: DC

## 2021-05-26 MED ORDER — VITAMIN D3 25 MCG PO TABS: 2000.0000 [IU] | ORAL_TABLET | Freq: Two times a day (BID) | ORAL | Status: DC

## 2021-05-26 MED ORDER — ADULT MULTIVITAMIN W/MINERALS CH: 1.0000 | ORAL_TABLET | Freq: Every day | ORAL | Status: AC

## 2021-05-26 NOTE — Progress Notes (Signed)
Nutrition Follow-up  DOCUMENTATION CODES:   Severe malnutrition in context of chronic illness  INTERVENTION:  -d/c Glucerna -d/c Ensure Max -PROSource PLUS PO 82ms BID, each supplement provides 100 kcals and 15 grams of protein -continue MVI with minerals daily -continue feeding assistance with meals as needed  NUTRITION DIAGNOSIS:   Severe Malnutrition related to chronic illness (COPD, heart failure) as evidenced by severe fat depletion, severe muscle depletion.  updated  GOAL:   Patient will meet greater than or equal to 90% of their needs  progressing  MONITOR:   PO intake, Supplement acceptance, Labs, Weight trends, Skin, I & O's  REASON FOR ASSESSMENT:   Consult Assessment of nutrition requirement/status  ASSESSMENT:   Caitlin Oliver a pleasant 81yo female with a PMH of T2DM, carotid artery stenosis s/p bilateral endarterectomies, PVD, PAH and HFpEF, CVA, lung cancer, COPD on 3L who presents after a fall at home where her leg "gave out on her" and she fell and was unable to get up.  Pt did not want to talk with RD at time of visit and asked "Are you here to bother me?" PT did allow RD to perform modified nutrition-focused physical exam, but refused to answer any questions. Per RN, pt has been refusing oral nutrition supplements (Ensure Max daily and Glucerna BID). Will trial other supplements.   PO Intake: 0-100%% x 8 recorded meals (69% avg meal intake)  UOP: [id number] hours I/O: -[id number] admit  Medications: vitamin C, vitamin D3, colace, SSI TID w/ meals and bedtime, Mag-ox, mvi with minerals, protonix, senokot, zinc sulfate  Labs: Recent Labs  Lab 05/23/21 1837 05/24/21 0201 05/25/21 0303 05/26/21 0324  NA 140 139 138 138  K 4.5 4.3 4.1 3.9  CL 99 96* 96* 94*  CO2 32 27 33* 36*  BUN _0 CREATININE 1.17* 1.32* 1.29* 1.42*  CALCIUM 9.3 9.0 9.2 9.2  MG 2.3  --   --   --   PHOS 4.0  --   --   --   GLUCOSE 198* 193* 135* 164*  CBGs:  135-238 x24 hours    NUTRITION - FOCUSED PHYSICAL EXAM:  Flowsheet Row Most Recent Value  Orbital Region Mild depletion  Upper Arm Region Severe depletion  Thoracic and Lumbar Region Severe depletion  Buccal Region Moderate depletion  Temple Region Moderate depletion  Clavicle Bone Region Severe depletion  Clavicle and Acromion Bone Region Severe depletion  Scapular Bone Region Severe depletion  Dorsal Hand Severe depletion  Patellar Region Moderate depletion  Anterior Thigh Region Moderate depletion  Posterior Calf Region Moderate depletion  Edema (RD Assessment) Mild  Hair Reviewed  Eyes Unable to assess  Mouth Unable to assess  Skin Reviewed  Nails Reviewed       Diet Order:   Diet Order             Diet Carb Modified Fluid consistency: Thin; Room service appropriate? Yes  Diet effective now                   EDUCATION NEEDS:   No education needs have been identified at this time  Skin:  Skin Assessment: Skin Integrity Issues: Skin Integrity Issues:: Incisions Incisions: R leg  Last BM:  2/09  Height:   Ht Readings from Last 1 Encounters:  05/12/21 5' 4" (1.626 m)    Weight:   Wt Readings from Last 1 Encounters:  05/12/21 79.3 kg    Ideal Body Weight:  54.5 kg  BMI:  Body mass index is 30.01 kg/m.  Estimated Nutritional Needs:   Kcal:  1700-1900  Protein:  85-100 grams  Fluid:  > 1.7 L     Caitlin Oliver A., Caitlin, RD, LDN (she/her/hers) RD pager number and weekend/on-call pager number located in [place].

## 2021-05-26 NOTE — Progress Notes (Addendum)
Orthopaedic Trauma Service Progress Note  Patient ID: Caitlin [place] MRN: [id number] DOB/AGE: 12-10-1940 81 y.o.  Subjective:  Doing ok  No complaints Got to chair yesterday  Will likely need snf   ROS As above Objective:   VITALS:   Vitals:   05/25/21 2120 05/25/21 2350 05/26/21 0840 05/26/21 0841  BP: 110/82  (!) 156/72 (!) 156/72  Pulse: 77 73 83 76  Resp: _0 Temp: 98.3 F (36.8 C)  97.9 F (36.6 C) 97.9 F (36.6 C)  TempSrc: Oral  Oral Oral  SpO2: 92% 100% 99% 99%  Weight:      Height:        Estimated body mass index is 30.01 kg/m as calculated from the following:   Height as of this encounter: 5' 4" (1.626 m).   Weight as of this encounter: 79.3 kg.   Intake/Output      02/09 0701 02/10 0700 02/10 0701 02/11 0700   P.O. 240    Total Intake(mL/kg) 240 (3)    Urine (mL/kg/hr) 2100 (1.1) 400 (2.6)   Total Output 2100 400   Net -1860 -400        Urine Occurrence 1 x      LABS  Results for orders placed or performed during the hospital encounter of 05/12/21 (from the past 24 hour(s))  Glucose, capillary     Status: Abnormal   Collection Time: 05/25/21 11:34 AM  Result Value Ref Range   Glucose-Capillary 181 (H) 70 - 99 mg/dL  Glucose, capillary     Status: Abnormal   Collection Time: 05/25/21  4:21 PM  Result Value Ref Range   Glucose-Capillary 135 (H) 70 - 99 mg/dL  Glucose, capillary     Status: Abnormal   Collection Time: 05/25/21  8:47 PM  Result Value Ref Range   Glucose-Capillary 238 (H) 70 - 99 mg/dL  CBC     Status: Abnormal   Collection Time: 05/26/21  3:24 AM  Result Value Ref Range   WBC 9.4 4.0 - 10.5 K/uL   RBC 2.83 (L) 3.87 - 5.11 MIL/uL   Hemoglobin 8.7 (L) 12.0 - 15.0 g/dL   HCT 27.2 (L) 36.0 - 46.0 %   MCV 96.1 80.0 - 100.0 fL   MCH 30.7 26.0 - 34.0 pg   MCHC 32.0 30.0 - 36.0 g/dL   RDW 14.0 11.5 - 15.5 %   Platelets 360 150 - 400 K/uL    nRBC 0.4 (H) 0.0 - 0.2 %  Basic metabolic panel     Status: Abnormal   Collection Time: 05/26/21  3:24 AM  Result Value Ref Range   Sodium 138 135 - 145 mmol/L   Potassium 3.9 3.5 - 5.1 mmol/L   Chloride 94 (L) 98 - 111 mmol/L   CO2 36 (H) 22 - 32 mmol/L   Glucose, Bld 164 (H) 70 - 99 mg/dL   BUN 17 8 - 23 mg/dL   Creatinine, Ser 1.42 (H) 0.44 - 1.00 mg/dL   Calcium 9.2 8.9 - 10.3 mg/dL   GFR, Estimated 37 (L) >60 mL/min   Anion gap 8 5 - 15  Glucose, capillary     Status: Abnormal   Collection Time: 05/26/21  6:57 AM  Result Value Ref Range   Glucose-Capillary 173 (H) 70 - 99  mg/dL     PHYSICAL EXAM:   Gen: sitting up in bed, NAD, eating breakfast  Lungs: unlabored Cardiac: reg Ext:       Right Lower Extremity              Dressing clean, dry and intact   Dressings removed    All wound look good   Ecchymosis stable to R leg    Swelling improved             Ext warm              + DP pulse             No DCT              Compartments soft              No pain out of proportion with passive stretch              DPN, SPN, TN sensation grossly intact             EHL, FHL, lesser toe motor intact             Ankle flexion, extension, inversion and eversion are intact                     Assessment/Plan: 3 Days Post-Op   Principal Problem:   Tibia/fibula fracture Active Problems:   Type 2 diabetes mellitus ([place])   COPD   Delirium   Acute on chronic respiratory failure with hypoxia and hypercapnia (HCC)   Anti-infectives (From admission, onward)    Start     Dose/Rate Route Frequency Ordered Stop   05/24/21 0600  ceFAZolin (ANCEF) IVPB 2g/100 mL premix        2 g 200 mL/hr over 30 Minutes Intravenous On call to O.R. 05/23/21 1337 05/23/21 1419   05/23/21 2200  ceFAZolin (ANCEF) IVPB 2g/100 mL premix        2 g 200 mL/hr over 30 Minutes Intravenous Every 8 hours 05/23/21 1807 05/24/21 1346   05/23/21 1337  ceFAZolin (ANCEF) 2-4 GM/100ML-% IVPB       Note to  Pharmacy: Humberto Leep O: cabinet override      05/23/21 1337 05/24/21 0144   05/15/21 1000  vancomycin (VANCOCIN) IVPB 1000 mg/200 mL premix  Status:  Discontinued        1,000 mg 200 mL/hr over 60 Minutes Intravenous Every 48 hours 05/13/21 1015 05/14/21 0814   05/13/21 0900  vancomycin (VANCOCIN) IVPB 1000 mg/200 mL premix        1,000 mg 200 mL/hr over 60 Minutes Intravenous STAT 05/13/21 0846 05/13/21 1041   05/13/21 0845  cefTRIAXone (ROCEPHIN) 2 g in sodium chloride 0.9 % 100 mL IVPB        2 g 200 mL/hr over 30 Minutes Intravenous Every 24 hours 05/13/21 0803 05/17/21 1229   05/13/21 0600  ceFAZolin (ANCEF) IVPB 2g/100 mL premix  Status:  Discontinued        2 g 200 mL/hr over 30 Minutes Intravenous To Short Stay 05/13/21 0253 05/13/21 0803     .  POD/HD#: 22  81 y/o female s/p fall approximately 2 weeks ago    -fall 0/30/1499 complicated by acute metabolic enchephalopathy/shock             Metabolic derangements improving   - closed R tibia and fibula fracture s/p IMN  NWB R leg x 6 weeks              Unrestricted ROM R ankle and knee             Dressing changed today    Mepilex dressings applied   Will order compression sock               Ice and elevate for swelling and pain control              Therapy evals                          zero knee bone foam to help float heel    PT- please teach HEP for R knee ROM- AROM, PROM. Prone exercises as well. No ROM restrictions.  Quad sets, SLR, LAQ, SAQ, heel slides, stretching, prone flexion and extension   Ankle theraband program, heel cord stretching, toe towel curls, etc   No pillows under bend of knee when at rest, ok to place under heel to help work on extension. Can also use zero knee bone foam if available     - Pain management:             Multimodal              Minimize narcotics    - DVT/PE prophylaxis:             Ok to resume home plavix from ortho standpoint              Continue ASA     Plavix and aspirin sufficient for DVT prophylaxis given injury. Would do ASA for 4 weeks    - ID:              Periop abx completed   - Metabolic Bone Disease:             Poor bone quality---> multifactorial                          DM, COPD on supp o2, nicotine dependence, chronic torsemide, PPI use,              This is a fragility fracture              DEXA as outpt              vitamin d levels are ok                           Outpt referral to osteoporosis clinic               - Activity:             As above   - Impediments to fracture healing:             As above   - Dispo:             Ortho issues stable             Therapies Follow up with ortho in 2 weeks    Jari Pigg, PA-C [phone] (C) 05/26/2021, 8:56 AM  [facility] [place] [address] [phone] Jenetta [phone] (F)    After 5pm and on the weekends please log on to Amion, go to orthopaedics and the look under the [facility] Call for the provider(s)  on call. You can also call our office at [phone] and then follow the prompts to be connected to the call team.   Patient ID: Caitlin [place], female   DOB: [birth year], 81 y.o.   MRN: [id number]

## 2021-05-26 NOTE — Discharge Summary (Signed)
[facility] Discharge Summary  Patient name: Caitlin [place] Medical record number: [id number] Date of birth: 10/15/1940 Age: 81 y.o. Gender: female Date of Admission: 05/12/2021  Date of Discharge: 05/27/2021 Admitting Physician: Shary Key, DO  Primary Care Provider: [name], PA-C Consultants: PCCM, Neurology, Ortho  Indication for Hospitalization: Right Tibia Fibula Fracture  Discharge Diagnoses/Problem List:  Principal Problem:   Tibia/fibula fracture Active Problems:   Type 2 diabetes mellitus ([place])   COPD   Delirium   Acute on chronic respiratory failure with hypoxia and hypercapnia (HCC)   Protein-calorie malnutrition, severe   Disposition: SNF  Discharge Condition: Stable  Discharge Exam:  From Dr. Kirkland Hun same day progress note: General: NAD, nontoxic elderly woman in bed  Cardiovascular: RRR, no m/g/r Respiratory: CTAB without crackles  Abdomen: Nondistended  Extremities: Gray calf length shoe on right, well perfused. 1+ pitting edema on left  Brief Hospital Course:  Caitlin [place] is a 81 y.o. female presenting after an unwitnessed fall at home. PMH is significant for CVA, CHF, T2DM, HTN, HLD, pulmonary HTN, and COPD on 3L at baseline.  Right tibia and fibula closed fracture 2/2 fall from mechanical fall In the ED she was hypertensive and tachypneic. Physical exam demonstrated swollen R lower extremity with intermittent bruising and mild erythema that was severely painful to palpation but not passive foot flexion; no evidence of compartment syndrome appreciated. Labs were notable for normal WBC and Hgb as well as flat troponins. ECG was negative for ACS. Tib/fib XR was positive for displaced fractures in proximal R fibula and distal R tibia. CXR demonstrated small left pleural effusion, and CT head showed no acute intracranial abnormality. Orthopedics was consulted and she was scheduled for surgery on 1/28, however during  the preop assessment she was noted to be hypotensive and lethargic.  Hypotension did not improve with IV fluids and norepinephrine was started. Surgery was aborted and PCCM was consulted for ICU transfer. She transitioned back to the family medicine service on 2/1. She received surgery on 2/7 with an intramedullary nail without complications. Pain was controlled and patient was recommended rehab by PT for strengthening. She was discharged in stable, post-surgical condition on 2/11.   Acute Metabolic Encephalopathy vs Delirium, multifactorial  Acute Left Frontal CVA On 1/28 presented with shock like picture with fever to 102, hypotension that did not improve with fluid resuscitation and was started on vasopressors.  She was at this time unable to respond well and lethargic.  Surgery was aborted and was transferred to ICU.  Was started on ceftriaxone and vancomycin empirically for possible septic shock.  There was also concern for encephalopathy in the setting of long-term benzo use. CT head showed no acute abnormalities. She was subsequently found to have an acute white matter infarct of her left frontal lobe on MRI. She had waxing and waning mental status.  ABGs had shown some hypercarbia and BiPAP was initiated. [name] was alert and oriented x4 and mentating well.  She was discharged on Aspirin and Plavix DAPT until 2/20, after which time she should transition to Plavix monotherapy.   Undifferentiated Shock On 1/28 presented with shock like picture with fever to 102, hypotension that did not improve with fluid resuscitation and was started on vasopressors. Etiology was unclear.  She does have severe pulmonary hypertension which could have contributed as well.  On 1/28 had Tmax of 102 and WBC of 12.4 which raised the question of septic shock  but there was no apparent source. Possible lower lobe pneumonia although chest x-ray was not convincing.  Was started on empiric ceftriaxone,  de-escalated after blood cultures and urine cultures grew no organisms. Vasopressors were weaned and patient was transferred to [facility] after shock resolved.     Chronic Hypoxic Respiratory Failure +  Pulmonary Artery Hypertension Echo 1/29 with EF 65 to 42%, grade 1 diastolic dysfunction, severely elevated pulmonary artery systolic pressure, RVSP 59.5 mmHg.  Patient remained on her home 3 L of oxygen.  She was put back on her home torsemide at half the dose and to restart up full dose on day after discharge.  Anemia Likely 2/2 to chronic disease. Stayed mostly in the 8's during hospitalization. No pRBC required.   Chronic/stable Chronic HFpEF with diastolic dysfunction and EF 65-70%  Constipation  T2DM CKD 3a Severe malnutrition in context of chronic illness- RD assessed   Issues for Follow Up:  CBC and outpatient evaluation for anemia Would benefit from continuing BiPAP overnight, suspected sleep apnea but has refused sleep studies in the past Ensure Ortho follow up for R Tib-Fib Fracture  Restarted patient on home torsemide 40 mg BID, monitor fluid status and repeat BMP to monitor Cr/K Orthopedics recommended continuing aspirin for DVT prophylaxis for 4 weeks after surgery (around 3/7) Need a DEXA scan as an outpatient in addition to likely bisphosphonate therapy.  Vitamin D therapy is on board. Was on 50 mg BID metoprolol-decreased to 12.5 mg  Re-consider metformin therapy as patient is CKD 3a Discontinued sedating medications of valium, trazadone, librax, bentyl and gabapentin at discharge as patient was not receiving them in the hospital and could have contributed to her fall.    Significant Procedures: None  Significant Labs and Imaging:  Recent Labs  Lab 05/25/21 0303 05/26/21 0324 05/27/21 0119  WBC 9.4 9.4 9.2  HGB 8.5* 8.7* 8.9*  HCT 27.1* 27.2* 27.4*  PLT 324 360 381   Recent Labs  Lab 05/23/21 1837 05/24/21 0201 05/25/21 0303 05/26/21 0324 05/27/21 0119  NA  140 139 138 138 139  K 4.5 4.3 4.1 3.9 3.8  CL 99 96* 96* 94* 94*  CO2 32 27 33* 36* 35*  GLUCOSE 198* 193* 135* 164* 148*  BUN _0 CREATININE 1.17* 1.32* 1.29* 1.42* 1.57*  CALCIUM 9.3 9.0 9.2 9.2 9.3  MG 2.3  --   --   --   --   PHOS 4.0  --   --   --   --     Results/Tests Pending at Time of Discharge:   Discharge Medications:  Allergies as of 05/27/2021       Reactions   Fentanyl Shortness Of Breath        Medication List     STOP taking these medications    clidinium-chlordiazePOXIDE 5-2.5 MG capsule Commonly known as: LIBRAX   diazepam 5 MG tablet Commonly known as: VALIUM   dicyclomine 20 MG tablet Commonly known as: BENTYL   gabapentin 300 MG capsule Commonly known as: NEURONTIN   metolazone 5 MG tablet Commonly known as: ZAROXOLYN   metoprolol tartrate 50 MG tablet Commonly known as: LOPRESSOR   ondansetron 4 MG tablet Commonly known as: ZOFRAN   pravastatin 40 MG tablet Commonly known as: PRAVACHOL   traZODone 50 MG tablet Commonly known as: DESYREL       TAKE these medications    (feeding supplement) PROSource Plus liquid Take 30 mLs by mouth 2 (two) times  daily between meals.   acetaminophen 500 MG tablet Commonly known as: TYLENOL Take 500 mg by mouth every 6 (six) hours as needed for moderate pain.   albuterol (2.5 MG/3ML) 0.083% nebulizer solution Commonly known as: PROVENTIL Take 2.5 mg by nebulization in the morning and at bedtime.   albuterol 108 (90 Base) MCG/ACT inhaler Commonly known as: VENTOLIN HFA Inhale 2 puffs into the lungs every 6 (six) hours as needed for wheezing or shortness of breath.   aspirin 81 MG EC tablet Take 1 tablet (81 mg total) by mouth daily for 9 days. Swallow whole.   atorvastatin 40 MG tablet Commonly known as: LIPITOR Take 1 tablet (40 mg total) by mouth daily.   Biotin 1000 MCG tablet Take 1,000 mcg by mouth daily.   chlorhexidine 0.12 % solution Commonly known as:  PERIDEX 15 mLs by Mouth Rinse route 2 (two) times daily.   clopidogrel 75 MG tablet Commonly known as: PLAVIX Take 1 tablet (75 mg total) by mouth daily.   docusate sodium 100 MG capsule Commonly known as: COLACE Take 100 mg by mouth 2 (two) times daily as needed for mild constipation.   ferrous sulfate 325 (65 FE) MG tablet Take 325 mg by mouth daily with breakfast.   FLUoxetine 20 MG capsule Commonly known as: PROZAC Take 20 mg by mouth daily.   HYDROcodone-acetaminophen 5-325 MG tablet Commonly known as: NORCO/VICODIN Take 1 tablet by mouth every 6 (six) hours as needed for moderate pain.   Magnesium 250 MG Tabs Take 250 mg by mouth daily.   metFORMIN 500 MG tablet Commonly known as: GLUCOPHAGE Take by mouth 2 (two) times daily with a meal.   metoprolol succinate 25 MG 24 hr tablet Commonly known as: TOPROL-XL Take 0.5 tablets (12.5 mg total) by mouth daily.   multivitamin with minerals Tabs tablet Take 1 tablet by mouth daily.   naphazoline-glycerin 0.012-0.25 % Soln Commonly known as: CLEAR EYES REDNESS 1-2 drops 4 (four) times daily as needed for eye irritation.   ONE TOUCH ULTRA TEST test strip Generic drug: glucose blood   OXYGEN Inhale into the lungs. 3 liters n/c   pantoprazole 40 MG tablet Commonly known as: PROTONIX Take 40 mg by mouth 2 (two) times daily.   polyethylene glycol 17 g packet Commonly known as: MIRALAX / GLYCOLAX Take 17 g by mouth daily as needed for mild constipation.   potassium chloride SA 20 MEQ tablet Commonly known as: KLOR-CON M Take 20 mEq by mouth daily.   revefenacin 175 MCG/3ML nebulizer solution Commonly known as: YUPELRI Take 3 mLs (175 mcg total) by nebulization daily.   senna 8.6 MG Tabs tablet Commonly known as: SENOKOT Take 1 tablet (8.6 mg total) by mouth daily.   torsemide 20 MG tablet Commonly known as: DEMADEX Take 2 tablets (40 mg total) by mouth twice a day What changed:  how much to take how to  take this when to take this additional instructions   vitamin C 1000 MG tablet Take 1,000 mg by mouth daily.   Vitamin D3 25 MCG tablet Commonly known as: Vitamin D Take 2 tablets (2,000 Units total) by mouth 2 (two) times daily.   zinc sulfate 220 (50 Zn) MG capsule Take 1 capsule (220 mg total) by mouth daily.        Discharge Instructions: Please refer to Patient Instructions section of EMR for full details.  Patient was counseled important signs and symptoms that should prompt return to medical care, changes in  medications, dietary instructions, activity restrictions, and follow up appointments.   Follow-Up Appointments:  Follow-up Information     Altamese [place], MD. Schedule an appointment as soon as possible for a visit in 2 day(s).   Specialty: Orthopedic Surgery Contact information: [place] [address] [phone]         [name], PA-C. Schedule an appointment as soon as possible for a visit.   Specialty: Family Medicine Why: Please follow up with your primary care doctor at your earliest convenience for a hospital follow up. Contact information: [address] [place] [address] [phone]         Lorretta Harp, MD .   Specialties: Cardiology, Radiology Contact information: [address] [place] [address] [phone]                 Eppie Gibson, MD 05/27/2021, 12:28 PM PGY-1, [place]

## 2021-05-26 NOTE — Discharge Instructions (Signed)
Orthopaedic Trauma Service Discharge Instructions   General Discharge Instructions  Orthopaedic Injuries:  Right tibia/fibula fracture treated with intramedullary nailing of right tibia  WEIGHT BEARING STATUS:Nonweightbearing right leg.  Ambulate with use of walker  RANGE OF MOTION/ACTIVITY: Unrestricted range of motion of her right knee and ankle.  Please continue exercises the therapist talk to.  Zero knee bone foam (blue foam) to help keep knee in full extension and to float heel off the bed to prevent pressure sores.  Activity as tolerated while maintaining weightbearing restrictions  Bone health: Vitamin D levels look good however fracture is suggestive of osteoporosis.  You will need referral to osteoporosis clinic and a bone density scan in the next 4 to 8 weeks.  Review the following resource for additional information regarding bone health  [email or web address]  Wound Care: Daily wound care as needed.  Okay to leave wounds open to the air once they are dry and there is no drainage.  Okay to clean wounds with soap and water only.  Do not apply lotions or ointments or solutions of any kind. Discharge Wound Care Instructions  Do NOT apply any ointments, solutions or lotions to pin sites or surgical wounds.  These prevent needed drainage and even though solutions like hydrogen peroxide kill bacteria, they also damage cells lining the pin sites that help fight infection.  Applying lotions or ointments can keep the wounds moist and can cause them to breakdown and open up as well. This can increase the risk for infection. When in doubt call the office.  Surgical incisions should be dressed daily.  If any drainage is noted, use one layer of adaptic or Mepitel, then gauze, Kerlix, and an ace  wrap.  [email or web address]  These dressing supplies should be available at local medical supply stores ([employer],  medical, etc). They are not usually carried at places like [facility], [facility], [facility], etc  Once the incision is completely dry and without drainage, it may be left open to air out.  Showering may begin 36-48 hours later.  Cleaning gently with soap and water.  Traumatic wounds should be dressed daily as well.    One layer of adaptic, gauze, Kerlix, then ace wrap.  The adaptic can be discontinued once the draining has ceased    If you have a wet to dry dressing: wet the gauze with saline the squeeze as much saline out so the gauze is moist (not soaking wet), place moistened gauze over wound, then place a dry gauze over the moist one, followed by Kerlix wrap, then ace wrap.    Diet: as you were eating previously.  Can use over the counter stool softeners and bowel preparations, such as Miralax, to help with bowel movements.  Narcotics can be constipating.  Be sure to drink plenty of fluids  PAIN MEDICATION USE AND EXPECTATIONS  You have likely been given narcotic medications to help control your pain.  After a traumatic event that results in an fracture (broken bone) with or without surgery, it is ok to use narcotic pain medications to help control one's pain.  We understand that everyone responds to pain differently and each individual patient will be evaluated on a regular basis for the continued need for narcotic medications. Ideally, narcotic medication use should last no more than 6-8 weeks (coinciding with fracture healing).   As a patient it is your responsibility as well to monitor narcotic medication use and report the amount and frequency you use these medications when you  come to your office  visit.   We would also advise that if you are using narcotic medications, you should take a dose prior to therapy to maximize you participation.  IF YOU ARE ON NARCOTIC MEDICATIONS IT IS NOT PERMISSIBLE TO OPERATE A MOTOR VEHICLE (MOTORCYCLE/CAR/TRUCK/MOPED) OR HEAVY MACHINERY DO NOT MIX NARCOTICS WITH OTHER CNS (CENTRAL NERVOUS SYSTEM) DEPRESSANTS SUCH AS ALCOHOL   POST-OPERATIVE OPIOID TAPER INSTRUCTIONS: It is important to wean off of your opioid medication as soon as possible. If you do not need pain medication after your surgery it is ok to stop day one. Opioids include: Codeine, Hydrocodone(Norco, Vicodin), Oxycodone(Percocet, oxycontin) and hydromorphone amongst others.  Long term and even short term use of opiods can cause: Increased pain response Dependence Constipation Depression Respiratory depression And more.  Withdrawal symptoms can include Flu like symptoms Nausea, vomiting And more Techniques to manage these symptoms Hydrate well Eat regular healthy meals Stay active Use relaxation techniques(deep breathing, meditating, yoga) Do Not substitute Alcohol to help with tapering If you have been on opioids for less than two weeks and do not have pain than it is ok to stop all together.  Plan to wean off of opioids This plan should start within one week post op of your fracture surgery  Maintain the same interval or time between taking each dose and first decrease the dose.  Cut the total daily intake of opioids by one tablet each day Next start to increase the time between doses. The last dose that should be eliminated is the evening dose.    STOP SMOKING OR USING NICOTINE PRODUCTS!!!!  As discussed nicotine severely impairs your body's ability to heal surgical and traumatic wounds but also impairs bone healing.  Wounds and bone heal by forming microscopic blood vessels (angiogenesis) and nicotine is a vasoconstrictor (essentially, shrinks blood vessels).  Therefore,  if vasoconstriction occurs to these microscopic blood vessels they essentially disappear and are unable to deliver necessary nutrients to the healing tissue.  This is one modifiable factor that you can do to dramatically increase your chances of healing your injury.    (This means no smoking, no nicotine gum, patches, etc)  DO NOT USE NONSTEROIDAL ANTI-INFLAMMATORY DRUGS (NSAID'S)  Using products such as Advil (ibuprofen), Aleve (naproxen), Motrin (ibuprofen) for additional pain control during fracture healing can delay and/or prevent the healing response.  If you would like to take over the counter (OTC) medication, Tylenol (acetaminophen) is ok.  However, some narcotic medications that are given for pain control contain acetaminophen as well. Therefore, you should not exceed more than 4000 mg of tylenol in a day if you do not have liver disease.  Also note that there are may OTC medicines, such as cold medicines and allergy medicines that my contain tylenol as well.  If you have any questions about medications and/or interactions please ask your doctor/PA or your pharmacist.      ICE AND ELEVATE INJURED/OPERATIVE EXTREMITY  Using ice and elevating the injured extremity above your heart can help with swelling and pain control.  Icing in a pulsatile fashion, such as 20 minutes on and 20 minutes off, can be followed.    Do not place ice directly on skin. Make sure there is a barrier between to skin and the ice pack.    Using frozen items such as frozen peas works well as the conform nicely to the are that needs to be iced.  USE AN ACE WRAP OR TED HOSE FOR SWELLING CONTROL  In addition to icing and elevation, Ace wraps or TED hose are used to help limit and resolve swelling.  It is recommended to use Ace wraps or TED hose until you are informed to stop.    When using Ace Wraps start the wrapping distally (farthest away from the body) and wrap proximally (closer to the body)   Example: If you had surgery  on your leg or thing and you do not have a splint on, start the ace wrap at the toes and work your way up to the thigh        If you had surgery on your upper extremity and do not have a splint on, start the ace wrap at your fingers and work your way up to the upper arm  IF YOU ARE IN A SPLINT OR CAST DO NOT [place]   If your splint gets wet for any reason please contact the office immediately. You may shower in your splint or cast as long as you keep it dry.  This can be done by wrapping in a cast cover or garbage back (or similar)  Do Not stick any thing down your splint or cast such as pencils, money, or hangers to try and scratch yourself with.  If you feel itchy take benadryl as prescribed on the bottle for itching  IF YOU ARE IN A CAM BOOT (BLACK BOOT)  You may remove boot periodically. Perform daily dressing changes as noted below.  Wash the liner of the boot regularly and wear a sock when wearing the boot. It is recommended that you sleep in the boot until told otherwise    Call office for the following: Temperature greater than 101F Persistent nausea and vomiting Severe uncontrolled pain Redness, tenderness, or signs of infection (pain, swelling, redness, odor or green/yellow discharge around the site) Difficulty breathing, headache or visual disturbances Hives Persistent dizziness or light-headedness Extreme fatigue Any other questions or concerns you may have after discharge  In an emergency, call 911 or go to an Emergency Department at a nearby hospital  HELPFUL INFORMATION  If you had a block, it will wear off between 8-24 hrs postop typically.  This is period when your pain may go from nearly zero to the pain you would have had postop without the block.  This is an abrupt transition but nothing dangerous is happening.  You may take an extra dose of narcotic when this happens.  You should wean off your narcotic medicines as soon as you are able.  Most  patients will be off or using minimal narcotics before their first postop appointment.   We suggest you use the pain medication the first night prior to going to bed, in order to ease any pain when the anesthesia wears off. You should avoid taking pain medications on an empty stomach as it will make you nauseous.  Do not drink alcoholic beverages or take illicit drugs when taking pain medications.  In most states it is against the law to drive while you are in a splint or sling.  And certainly against the law to drive while taking narcotics.  You may return to work/school in the next couple of days when you feel up to it.   Pain medication may make you constipated.  Below are a few solutions to try in this order: Decrease the amount of pain medication if you arent having pain. Drink lots of decaffeinated fluids. Drink prune juice and/or each dried prunes  If the first 3 dont work start with additional solutions Take Colace - an over-the-counter stool softener Take Senokot - an over-the-counter laxative Take Miralax - a stronger over-the-counter laxative     CALL THE OFFICE WITH ANY QUESTIONS OR CONCERNS: [phone]   VISIT OUR WEBSITE FOR ADDITIONAL INFORMATION: [email or web address]

## 2021-05-26 NOTE — Progress Notes (Signed)
Occupational Therapy Treatment Patient Details Name: Caitlin Oliver MRN: [id number] DOB: May 19, 1940 Today's Date: 05/26/2021   History of present illness Pt is an 81 y.o. female admitted 05/12/21 after fall at home, denies hitting head or LOC; pt also notes she fell and hit her head 1 wk prior. Pt sustained R tibia/fibula fx. Transfer to ICU 1/28 with hypotension, tachycardia and encephalopathy. MRI 1/30 showed acute punctate white matter infarct L frontal lobe; chronic small vessel ischemic changes. S/p R tibial IMN on 2/7. PMH includes DM2, PAD, pulmonary HTN, COPD (wears 3L O2 at home).   OT comments  Pt. Seen for skilled OT treatment session.  Able to complete bed mobility and transfer to/from bsc for toileting task.  Good management of NWB of RLE.  Cues required for hand placement and RW management during mobility.  Progressing well.     Recommendations for follow up therapy are one component of a multi-disciplinary discharge planning process, led by the attending physician.  Recommendations may be updated based on patient status, additional functional criteria and insurance authorization.    Follow Up Recommendations  Acute inpatient rehab (3hours/day)    Assistance Recommended at Discharge Frequent or constant Supervision/Assistance  Patient can return home with the following      Equipment Recommendations       Recommendations for Other Services Rehab consult    Precautions / Restrictions Precautions Precautions: Fall;Other (comment) Precaution Comments: Wears 3L O2 baseline       Mobility Bed Mobility Overal bed mobility: Needs Assistance Bed Mobility: Supine to Sit     Supine to sit: Min guard     General bed mobility comments: no physical assist required for management of bles    Transfers Overall transfer level: Needs assistance Equipment used: Rolling walker (2 wheels) Transfers: Sit to/from Stand, Bed to chair/wheelchair/BSC Sit to Stand: Min assist Stand  pivot transfers: Min assist, Mod assist         General transfer comment: inital sit/stand mod a with cues for pushing through B ues. next sit/stands and transfers with min a.  cues for hand placement throughout and max cues for having leg touch chair before sitting down with ues of hands on arm rests. no cues for maintaining nwb status!!!     Balance                                           ADL either performed or assessed with clinical judgement   ADL Overall ADL's : Needs assistance/impaired                         Toilet Transfer: Minimal assistance;Cueing for sequencing;Cueing for safety;BSC/3in1 Toilet Transfer Details (indicate cue type and reason): eob recliner, L then bsc L and back to recliner R Toileting- Clothing Manipulation and Hygiene: Set up;Sitting/lateral lean       Functional mobility during ADLs: Moderate assistance General ADL Comments: pt. able to complete multiple transfers from bed and 3n1 with good adherance to precuations nwb rle    Extremity/Trunk Assessment              Vision       Perception     Praxis      Cognition Arousal/Alertness: Awake/alert Behavior During Therapy: WFL for tasks assessed/performed Overall Cognitive Status: No family/caregiver present to determine baseline cognitive functioning  Exercises      Shoulder Instructions       General Comments      Pertinent Vitals/ Pain       Pain Assessment Pain Assessment: Faces Faces Pain Scale: Hurts little more Pain Location: RLE Pain Descriptors / Indicators: Discomfort, Grimacing, Guarding Pain Intervention(s): Limited activity within patient's tolerance, Monitored during session, Repositioned, Patient requesting pain meds-RN notified  Home Living                                          Prior Functioning/Environment              Frequency  Min 2X/week         Progress Toward Goals  OT Goals(current goals can now be found in the care plan section)  Progress towards OT goals: Progressing toward goals     Plan Discharge plan remains appropriate    Co-evaluation                 AM-PAC OT "6 Clicks" Daily Activity     Outcome Measure   Help from another person eating meals?: None Help from another person taking care of personal grooming?: A Little Help from another person toileting, which includes using toliet, bedpan, or urinal?: Total Help from another person bathing (including washing, rinsing, drying)?: A Lot Help from another person to put on and taking off regular upper body clothing?: Total Help from another person to put on and taking off regular lower body clothing?: A Little 6 Click Score: 14    End of Session Equipment Utilized During Treatment: Rolling walker (2 wheels);Oxygen  OT Visit Diagnosis: Unsteadiness on feet (R26.81);Pain;Muscle weakness (generalized) (M62.81);History of falling (Z91.81);Other symptoms and signs involving cognitive function   Activity Tolerance Patient tolerated treatment well   Patient Left in chair;with call bell/phone within reach   Nurse Communication Other (comment) (notified rn pt. opting for no purewik and will call to use bsc, pt. also requesting anti nausea meds and pain meds)        Time: 6256-3893 OT Time Calculation (min): 30 min  Charges: OT General Charges $OT Visit: 1 Visit OT Treatments $Self Care/Home Management : 23-37 mins  Sonia Baller, COTA/L Acute Rehabilitation [phone]   Tanya Nones 05/26/2021, 1:17 PM

## 2021-05-26 NOTE — Progress Notes (Signed)
FPTS Brief Progress Note  S: Pt is sleeping with BiPAP, did not wake the patient.    O: BP 110/82 (BP Location: Right Arm)    Pulse 73    Temp 98.3 F (36.8 C) (Oral)    Resp 20    Ht _0  (1.626 m)    Wt 79.3 kg    SpO2 100%    BMI 30.01 kg/m   General: Sleeping in bed  Respiratory: No respiratory distress    A/P: Patient is doing quite well post op. Tolerating BiPAP. Rest of plan per day team.   Erskine Emery, MD 05/26/2021, 1:02 AM PGY-1, [facility] Night Resident  Please page [phone] with questions.

## 2021-05-26 NOTE — Progress Notes (Signed)
Physical Therapy Treatment Patient Details Name: Caitlin Oliver MRN: [id number] DOB: 1940/10/12 Today's Date: 05/26/2021   History of Present Illness Pt is an 81 y.o. female admitted 05/12/21 after fall at home, denies hitting head or LOC; pt also notes she fell and hit her head 1 wk prior. Pt sustained R tibia/fibula fx. Transfer to ICU 1/28 with hypotension, tachycardia and encephalopathy. MRI 1/30 showed acute punctate white matter infarct L frontal lobe; chronic small vessel ischemic changes. S/p R tibial IMN on 2/7. PMH includes DM2, PAD, pulmonary HTN, COPD (wears 3L O2 at home).   PT Comments    Pt progressing with mobility. Today's session focused on transfer training with RW; pt demonstrates improving ability to maintain RLE NWB precautions, still unable to fully hop on LLE; pt also with c/o persistent nausea and dizziness with standing activity (VSS, see below). Pt remains limited by generalized weakness, decreased activity tolerance, poor balance strategies/postural reactions and impaired cognition. Noted CIR declined due to lack of caregiver support, therefore, recommend SNF-level therapies to maximize functional mobility and independence prior to return home.  SpO2 98% on 3L O2 [place], HR 92 BP 132/72    Recommendations for follow up therapy are one component of a multi-disciplinary discharge planning process, led by the attending physician.  Recommendations may be updated based on patient status, additional functional criteria and insurance authorization.  Follow Up Recommendations  Skilled nursing-short term rehab (<3 hours/day) (CIR declined)     Assistance Recommended at Discharge Frequent or constant Supervision/Assistance  Patient can return home with the following A lot of help with walking and/or transfers;A lot of help with bathing/dressing/bathroom;Assistance with cooking/housework;Assist for transportation;Help with stairs or ramp for entrance   Equipment Recommendations   Rolling walker (2 wheels);BSC/3in1;Wheelchair (measurements PT);Wheelchair cushion (measurements PT)    Recommendations for Other Services       Precautions / Restrictions Precautions Precautions: Fall;Other (comment) Precaution Comments: Wears 3L O2 baseline Restrictions Weight Bearing Restrictions: Yes RLE Weight Bearing: Non weight bearing     Mobility  Bed Mobility Overal bed mobility: Needs Assistance Bed Mobility: Supine to Sit     Supine to sit: Supervision, HOB elevated     General bed mobility comments: use of UEs to assist RLE to EOB    Transfers Overall transfer level: Needs assistance Equipment used: Rolling walker (2 wheels) Transfers: Sit to/from Stand Sit to Stand: Mod assist, Min assist Stand pivot transfers: Mod assist         General transfer comment: initial heavy modA for trunk elevation and stability standing from EOB to RW, pt requiring increased time and cues for correct hand placement and sequencing, poor eccentric control to sit; pt unable to hop on single leg, relying on scooting L foot to pivot to recliner; additional sit<>stands from recliner, heavy reliance on UE support to push to stand, minA for trunk elevation; improving ability to maintain RLE NWB precautions    Ambulation/Gait                   Stairs             Wheelchair Mobility    Modified Rankin (Stroke Patients Only)       Balance Overall balance assessment: Needs assistance   Sitting balance-Leahy Scale: Fair     Standing balance support: Bilateral upper extremity supported, During functional activity, Reliant on assistive device for balance Standing balance-Leahy Scale: Poor Standing balance comment: heavy reliance on BUE support, external assist  Cognition Arousal/Alertness: Awake/alert Behavior During Therapy: WFL for tasks assessed/performed Overall Cognitive Status: No family/caregiver present to determine  baseline cognitive functioning Area of Impairment: Attention, Memory, Following commands, Safety/judgement, Awareness, Problem solving                   Current Attention Level: Sustained, Selective Memory: Decreased short-term memory, Decreased recall of precautions Following Commands: Follows one step commands with increased time Safety/Judgement: Decreased awareness of safety, Decreased awareness of deficits Awareness: Emergent Problem Solving: Slow processing, Decreased initiation, Difficulty sequencing, Requires verbal cues          Exercises General Exercises - Lower Extremity Ankle Circles/Pumps: AROM, Right, Seated (limited range) Long Arc Quad: AAROM, Right, Seated (lacking full extension) Hip Flexion/Marching: AROM, Right, Seated (partial ROM)    General Comments General comments (skin integrity, edema, etc.): SpO2 98% on 3L O2 [place] post-activity, HR 92. Pt c/o consistent dizziness and nausea with activity; post-transfer BP 132/72; RN aware of pt requesting pain and nausea meds      Pertinent Vitals/Pain Pain Assessment Pain Assessment: Faces Faces Pain Scale: Hurts little more Pain Location: RLE Pain Descriptors / Indicators: Discomfort, Grimacing, Guarding Pain Intervention(s): Monitored during session, Limited activity within patient's tolerance, Patient requesting pain meds-RN notified    Home Living                          Prior Function            PT Goals (current goals can now be found in the care plan section) Progress towards PT goals: Progressing toward goals    Frequency    Min 3X/week      PT Plan Discharge plan needs to be updated;Frequency needs to be updated    Co-evaluation              AM-PAC PT "6 Clicks" Mobility   Outcome Measure  Help needed turning from your back to your side while in a flat bed without using bedrails?: A Little Help needed moving from lying on your back to sitting on the side of a flat  bed without using bedrails?: A Little Help needed moving to and from a bed to a chair (including a wheelchair)?: A Lot Help needed standing up from a chair using your arms (e.g., wheelchair or bedside chair)?: A Lot Help needed to walk in hospital room?: Total Help needed climbing 3-5 steps with a railing? : Total 6 Click Score: 12    End of Session Equipment Utilized During Treatment: Gait belt;Oxygen Activity Tolerance: Patient tolerated treatment well Patient left: in chair;with call bell/phone within reach;with chair alarm set Nurse Communication: Mobility status PT Visit Diagnosis: Other abnormalities of gait and mobility (R26.89);Muscle weakness (generalized) (M62.81);Pain Pain - Right/Left: Right Pain - part of body: Leg     Time: 9628-3662 PT Time Calculation (min) (ACUTE ONLY): 22 min  Charges:  $Therapeutic Activity: 8-22 mins                     Mabeline Caras, PT, DPT Acute Rehabilitation Services  Pager [phone] Office [place] 05/26/2021, 4:51 PM

## 2021-05-26 NOTE — TOC Progression Note (Addendum)
Transition of Care [facility]) - Progression Note    Patient Details  Name: Caitlin Oliver MRN: [id number] Date of Birth: 1940-09-04  Transition of Care [facility]) CM/SW Contact  Joanne Chars, LCSW Phone Number: 05/26/2021, 9:42 AM  Clinical Narrative:   CSW provided bed offers to pt, she asked CSW to call niece Prince Solian.  CSW called, LM for [employer].  Auth request submitted to [name].   1130: Second attempt to reach niece [name].  No answer.   1200: CSW spoke with pt in room, updated that I have been unable to reach [place].  Pt nephew Zane and his girlfriend in room, permission given to speak with them present.  Discussed need to pick placement.  Current Medicare.gov document did not list current bed offers, CSW went to get another and when returned nephew had left and pt reports he had MD appt and could not wait.  Pt states she has not been able to reach niece [name] for several days.  Discussed bed options.  Pt has been to [facility] in the past and had good experience there, states family all live nearby and she would like to [facility].  CSW spoke to Elyse Hsu and she can accept pt today.  CSW called, LM with [employer] giving them the facility choice.   1320: CSW received call from Prince Solian, went over the bed offers, she is agreeable to [facility] as well.  2/13: CSW had voicemail from [facility] after hours with auth infor: SNF auth: [id number], ambulance auth: [id number].   Expected Discharge Plan: [place] Barriers to Discharge: Continued Medical Work up, SNF Pending bed offer  Expected Discharge Plan and Services Expected Discharge Plan: [place] In-house Referral: Clinical Social Work   Post Acute Care Choice: [place] Living arrangements for the past 2 months: Single Family Home                                       Social Determinants of Health (SDOH) Interventions    Readmission Risk  Interventions Readmission Risk Prevention Plan 05/15/2021 11/17/2018  Transportation Screening Complete Complete  PCP or Specialist Appt within 3-5 Days Not Complete Not Complete  Not Complete comments not ready for d/c not ready for dc  HRI or [place] Complete Complete  Social Work Consult for [place] Planning/Counseling Complete Complete  Palliative Care Screening Not Applicable Not Applicable  Medication Review Press photographer) Referral to Pharmacy Patient Refused  Some recent data might be hidden

## 2021-05-26 NOTE — Progress Notes (Signed)
RT Note: Patient on 3L [place]. BIPAP on stand-by. Per RN, patient requested to be taken off BIPAP. No distress noted.

## 2021-05-27 DIAGNOSIS — A419 Sepsis, unspecified organism: Secondary | ICD-10-CM | POA: Diagnosis not present

## 2021-05-27 DIAGNOSIS — I639 Cerebral infarction, unspecified: Secondary | ICD-10-CM | POA: Diagnosis not present

## 2021-05-27 DIAGNOSIS — S82401A Unspecified fracture of shaft of right fibula, initial encounter for closed fracture: Secondary | ICD-10-CM | POA: Diagnosis not present

## 2021-05-27 DIAGNOSIS — S82201A Unspecified fracture of shaft of right tibia, initial encounter for closed fracture: Secondary | ICD-10-CM | POA: Diagnosis not present

## 2021-05-27 LAB — BASIC METABOLIC PANEL
Anion gap: 10 (ref 5–15)
BUN: 17 mg/dL (ref 8–23)
CO2: 35 mmol/L — ABNORMAL HIGH (ref 22–32)
Calcium: 9.3 mg/dL (ref 8.9–10.3)
Chloride: 94 mmol/L — ABNORMAL LOW (ref 98–111)
Creatinine, Ser: 1.57 mg/dL — ABNORMAL HIGH (ref 0.44–1.00)
GFR, Estimated: 33 mL/min — ABNORMAL LOW (ref >60)
Glucose, Bld: 148 mg/dL — ABNORMAL HIGH (ref 70–99)
Potassium: 3.8 mmol/L (ref 3.5–5.1)
Sodium: 139 mmol/L (ref 135–145)

## 2021-05-27 LAB — RESP PANEL BY RT-PCR (FLU A&B, COVID) ARPGX2
Influenza A by PCR: NEGATIVE
Influenza B by PCR: NEGATIVE
SARS Coronavirus 2 by RT PCR: NEGATIVE

## 2021-05-27 LAB — CBC
HCT: 27.4 % — ABNORMAL LOW (ref 36.0–46.0)
Hemoglobin: 8.9 g/dL — ABNORMAL LOW (ref 12.0–15.0)
MCH: 31.2 pg (ref 26.0–34.0)
MCHC: 32.5 g/dL (ref 30.0–36.0)
MCV: 96.1 fL (ref 80.0–100.0)
Platelets: 381 10*3/uL (ref 150–400)
RBC: 2.85 MIL/uL — ABNORMAL LOW (ref 3.87–5.11)
RDW: 14.2 % (ref 11.5–15.5)
WBC: 9.2 10*3/uL (ref 4.0–10.5)
nRBC: 0 % (ref 0.0–0.2)

## 2021-05-27 LAB — GLUCOSE, CAPILLARY
Glucose-Capillary: 208 mg/dL — ABNORMAL HIGH (ref 70–99)
Glucose-Capillary: 154 mg/dL — ABNORMAL HIGH (ref 70–99)
Glucose-Capillary: 114 mg/dL — ABNORMAL HIGH (ref 70–99)
Glucose-Capillary: 151 mg/dL — ABNORMAL HIGH (ref 70–99)

## 2021-05-27 MED ORDER — ASPIRIN 81 MG PO TBEC
81.0000 mg | DELAYED_RELEASE_TABLET | Freq: Every day | ORAL | Status: AC
Start: 2021-05-27 — End: 2021-06-05

## 2021-05-27 MED ORDER — CLOPIDOGREL BISULFATE 75 MG PO TABS: 75.0000 mg | ORAL_TABLET | Freq: Every day | ORAL | 2 refills | Status: AC

## 2021-05-27 NOTE — Progress Notes (Signed)
FPTS Brief Note Reviewed patient's vitals, recent notes.  Vitals:   05/26/21 1129 05/26/21 1947  BP: 108/63 131/65  Pulse: 95 65  Resp: 19 16  Temp: 98 F (36.7 C) 98.3 F (36.8 C)  SpO2: 98% 100%   VSS. At this time, no change in plan from day progress note.  Lyndee Hensen, DO Page [phone] with questions about this patient.

## 2021-05-27 NOTE — Progress Notes (Signed)
Report called to [place] at [facility].

## 2021-05-27 NOTE — Progress Notes (Signed)
[facility]  arrived to transport pt to SNF. Report was called to facility earlier. Pts vs are stable. All belongings sent with pt.

## 2021-05-27 NOTE — Progress Notes (Incomplete)
Family Medicine Teaching Service Daily Progress Note Intern Pager: [phone]  Patient name: Caitlin Oliver Medical record number: [id number] Date of birth: March 31, 1941 Age: 81 y.o. Gender: female  Primary Care Provider: [name], PA-C Consultants: PCCM, Neuro, Ortho Code Status: DNR  Pt Overview and Major Events to Date:  1/27 admitted to [facility] 1/28 found to be hypotensive, tachycardic, encephalopathic and febrile-transferred to ICU 1/29 on BiPAP in ICU CT head no acute abnormalities 1/30 MRI brain showed atrophy/chronic small vessel ischemic infarct of left frontal lobe 1/30 on Precedex/Ativan/Haldol for agitation 1/31 transfer to [facility] from ICU 2/7 surgery on right tibia/fib fracture   Assessment and Plan:  Caitlin Oliver is an 81 year old female who presented with a syncopal versus mechanical fall resulting in right tibia and fibula closed fracture.  Past medical history significant for type 2 diabetes, carotid artery stenosis, status post bilateral endarterectomies, extensive PAD, pulmonary hypertension and COPD.  Chronic hypoxemic respiratory failure on 3 L of oxygen baseline  tachypnea Patient is breathing well this morning and saturating within goal on 3 L.  Consider return to home torsemide dose 40 mg twice daily.  However, creatinine has increased 1.42 > 1.57 today. The torsemide will likely assist with pulmonary edema that was seen on chest x-ray.  White blood cell count today stable at 9.2.  Urine output 400 mL over 24 hours, 8 L net negative since admission. - Maintain oxygen saturation 88 to 92% - BiPAP overnight - Consider home torsemide dose 40 mg twice daily - Monitor kidney function and urine output  Right tib-fib closed fracture postop day 4 of intramedullary nail placement Right leg appears well perfused on exam today. - Ortho following, appreciate care and recommendations - Oxycodone 5 mg every 4 as needed - Tylenol 650 mg Q6 - PT following, appreciate  recommendations, recommend SNF placement  Acute metabolic encephalopathy, resolved Patient is alert and oriented x4 this a.m. and has been using her BiPAP effectively. - Encourage consistent BiPAP use at night - Monitor mental status  Chronic diastolic heart failure with EF of 65 to 70%  PAH On exam today, patient appears fairly euvolemic.  Heart rates have been 70s to 80s. - Metoprolol 12.5 mg daily - Consider home torsemide dose 40 mg twice daily  Abdominal pain  constipation Bowel movement documented on 2/10.  Abdominal exam benign. - Continue to monitor - MiraLAX twice daily, senna daily, Colace daily  Acute left frontal CVA  Continue atorvastatin 40 mg, dual antiplatelet therapy for 3 weeks from the date of MRI and then monotherapy with home Plavix. - DAPT and at 2/20 - Atorvastatin 40 mg  DM2 CBGs today have been 198 > 195 > 125. - CBG monitoring - Continue mSSI  CKD 3A Increase in creatinine from 1.42 yesterday to 1.57 today.  We will take this into consideration when determining torsemide dosage today. - We will monitor while on diuretics  Decreased p.o. intake - Ensure, Glucerna, MVI  FEN/GI: Heart healthy PPx: Lovenox Dispo: Patient is medically stable for SNF and is awaiting insurance approval and bed availability.  Subjective:  Patient is doing well this AM with no new complaints. She had some difficulty getting comfortable but slept fairly well.   Objective: Temp:  [97.9 F (36.6 C)-98.3 F (36.8 C)] 98.3 F (36.8 C) (02/10 1947) Pulse Rate:  [65-95] 65 (02/10 1947) Resp:  [16-19] 16 (02/10 1947) BP: (108-156)/(63-72) 131/65 (02/10 1947) SpO2:  [98 %-100 %] 100 % (02/10 1947) Physical Exam: General: NAD,  nontoxic elderly woman in bed  Cardiovascular: RRR, no m/g/r Respiratory: CTAB without crackles  Abdomen: Nondistended  Extremities: Gray calf length shoe on right, well perfused. 1+ pitting edema on left  Laboratory: Recent Labs  Lab  05/25/21 0303 05/26/21 0324 05/27/21 0119  WBC 9.4 9.4 9.2  HGB 8.5* 8.7* 8.9*  HCT 27.1* 27.2* 27.4*  PLT 324 360 381   Recent Labs  Lab 05/25/21 0303 05/26/21 0324 05/27/21 0119  NA 138 138 139  K 4.1 3.9 3.8  CL 96* 94* 94*  CO2 33* 36* 35*  BUN _0 CREATININE 1.29* 1.42* 1.57*  CALCIUM 9.2 9.2 9.3  GLUCOSE 135* 164* 148*     Erskine Emery, MD 05/27/2021, 3:41 AM PGY-1, [place] Intern pager: [phone], text pages welcome

## 2021-05-27 NOTE — TOC Transition Note (Addendum)
Transition of Care [facility]) - CM/SW Discharge Note   Patient Details  Name: Caitlin Oliver MRN: [id number] Date of Birth: 12-Dec-1940  Transition of Care [facility]) CM/SW Contact:  Gabrielle Dare Phone Number: 05/27/2021, 1:07 PM   Clinical Narrative:    Patient will Discharge To: [facility] Anticipated DC Date:05/27/21 Family Notified:Yes, Prince Solian, niece, [phone] Transport [id number]   Per MD patient ready for DC to  [facility]. RN, patient, patient's family, and facility notified of DC. Assessment, Fl2/Pasrr, and Discharge Summary sent to facility. RN given number for report [phone], Room 38). DC packet on chart. Ambulance transport requested for patient for 2:00pm.  CSW signing off.  Reed Breech LCSWA [phone]     Final next level of care: Skilled Nursing Facility Barriers to Discharge: No Barriers Identified   Patient Goals and CMS Choice Patient states their goals for this hospitalization and ongoing recovery are:: "get out, be independent" CMS Medicare.gov Compare Post Acute Care list provided to:: Patient Choice offered to / list presented to : Patient  Discharge Placement              Patient chooses bed at:  [facility]) Patient to be transferred to facility by: [place] Name of family member notified: Prince Solian, niece Patient and family notified of of transfer: 05/27/21  Discharge Plan and Services In-house Referral: Clinical Social Work   Post Acute Care Choice: [place]                               Social Determinants of Health (SDOH) Interventions     Readmission Risk Interventions Readmission Risk Prevention Plan 05/15/2021 11/17/2018  Transportation Screening Complete Complete  PCP or Specialist Appt within 3-5 Days Not Complete Not Complete  Not Complete comments not ready for d/c not ready for dc  HRI or [place] Complete Complete  Social Work Consult for [place]  Planning/Counseling Complete Complete  Palliative Care Screening Not Applicable Not Applicable  Medication Review Press photographer) Referral to Pharmacy Patient Refused  Some recent data might be hidden

## 2021-06-01 ENCOUNTER — Other Ambulatory Visit (HOSPITAL_COMMUNITY): Payer: Self-pay

## 2021-06-01 DIAGNOSIS — R1310 Dysphagia, unspecified: Secondary | ICD-10-CM

## 2021-06-06 ENCOUNTER — Other Ambulatory Visit (HOSPITAL_COMMUNITY): Payer: Self-pay | Admitting: Adult Health

## 2021-06-06 DIAGNOSIS — R1310 Dysphagia, unspecified: Secondary | ICD-10-CM

## 2021-06-16 ENCOUNTER — Ambulatory Visit (HOSPITAL_COMMUNITY)
Admission: RE | Admit: 2021-06-16 | Discharge: 2021-06-16 | Disposition: A | Discharge status: Home / Self Care | Payer: PPO | Source: Ambulatory Visit | Attending: Adult Health | Admitting: Adult Health

## 2021-06-16 ENCOUNTER — Other Ambulatory Visit: Payer: Self-pay

## 2021-06-16 DIAGNOSIS — R1310 Dysphagia, unspecified: Secondary | ICD-10-CM | POA: Diagnosis present

## 2021-06-16 DIAGNOSIS — I69391 Dysphagia following cerebral infarction: Secondary | ICD-10-CM | POA: Insufficient documentation

## 2021-06-16 DIAGNOSIS — K219 Gastro-esophageal reflux disease without esophagitis: Secondary | ICD-10-CM | POA: Insufficient documentation

## 2021-06-16 NOTE — Progress Notes (Incomplete)
? 06/16/21 1400  ?SLP Visit Information  ?SLP Received On 06/16/21  ?Subjective  ?Subjective pt awake in chair  ?Patient/Family Stated Goal none stated  ?General Information  ?Date of Onset 06/16/21  ?HPI Pt is an 81 yo female referred for OP MBS - brought in from her SNF, Tonya from SNF present with pt. Pt has h/o COPD, falls - requiring hospitalization 04/2021 -s/p tibial IMN 05/23/2021 with incidental CVA findings left frontal lobe white matter CVA, chronic ischemia vessel changes. Pt was seen for SLP BSE during hospital course, showing normal oropharyngeal swallow ability once ?pump? primed.  During hospital course, she also required Bipap - and was febrile with hypotension.  She admits to having pna recently.  She is a DNR.  Pt also has h/o CAD s/p bilateral CEAs, lung cancer s/p lobectomy, parotid gland stone (per pt) s/p surgical removal on left (approx. 1.5 yrs ago), PAD, DM2.  Pt has been at rehab since her dc from hospital after fall - Pt reports she has previously stayed at the SNF. Pt noted to have dyspnea sitting - stating this occurs when transferring, etc.  .  ?Type of Study MBS-Modified Barium Swallow Study  ?Previous Swallow Assessment see HPI  ?Diet Prior to this Study Regular;Thin liquids  ?Temperature Spikes Noted N/A  ?Respiratory Status Nasal cannula  ?History of Recent Intubation No  ?Behavior/Cognition Alert;Cooperative  ?Oral Cavity Assessment WFL  ?Oral Care Completed by SLP No  ?Oral Cavity - Dentition Adequate natural dentition  ?Vision Functional for self feeding  ?Self-Feeding Abilities Able to feed self  ?Patient Positioning Upright in chair  ?Baseline Vocal Quality Normal  ?Volitional Cough Strong  ?Volitional Swallow Able to elicit  ?Anatomy WFL  ?Pharyngeal Secretions Not observed secondary MBS  ?Oral Assessment (Complete on admission/transfer/every shift)  ?Does patient have any of the following "at risk" factors? Oxygen therapy - cannula, mask, simple oxygen devices  ?Oral  Motor/Sensory Function  ?Overall Oral Motor/Sensory Function Mild impairment  ?Facial ROM Reduced left  ?Facial Symmetry Abnormal symmetry left  ?Facial Strength WFL  ?Lingual ROM WFL  ?Lingual Symmetry WFL  ?Lingual Strength WFL  ?Velum WFL  ?Mandible WFL  ?Oral Preparation/Oral Phase  ?Oral Phase Impaired  ?Oral - Nectar  ?Oral - Nectar Cup [facility]  ?Oral - Thin  ?Oral - Thin Cup [facility]  ?Oral - Thin Straw WFL  ?Oral - Solids  ?Oral - Puree WFL  ?Oral - Mech Soft WFL  ?Oral - Pill Reduced posterior propulsion;Decreased bolus cohesion;Premature spillage  ?Pharyngeal Phase  ?Pharyngeal Phase Impaired  ?Pharyngeal - Nectar  ?Pharyngeal- Nectar Cup [facility]  ?Pharyngeal- Nectar Straw WFL  ?Pharyngeal - Thin  ?Pharyngeal- Thin Teaspoon WFL  ?Pharyngeal- Thin Cup Penetration/Aspiration during swallow  ?Pharyngeal Material enters airway, remains ABOVE vocal cords then ejected out  ?Pharyngeal- Thin Straw Penetration/Aspiration during swallow;Moderate aspiration  ?Pharyngeal Material enters airway, remains ABOVE vocal cords then ejected out  ?Pharyngeal - Solids  ?Pharyngeal- Puree WFL  ?Pharyngeal Material does not enter airway  ?Pharyngeal- Mechanical Soft WFL  ?Pharyngeal Material does not enter airway  ?Pharyngeal- Pill Reduced tongue base retraction;Pharyngeal residue - valleculae  ?Pharyngeal Material does not enter airway  ?Cervical Esophageal Phase  ?Cervical Esophageal Phase WFL  ?Clinical Impression  ?Clinical Impression Pt presents with mild oropharyngeal dysphagia mostly c/b decreased coordination of swallowing causing pt to have difficulty orally transiting tablet into pharynx with liquids.  She required several boluses of liquid to propel tablet into phayrnx. Tablet then lodged at vallecular region -  requirng extra liquids to clear.  After barium tablet had cleared into esophagus - pt reported sensation of it lodging in pharynx - Tablet appeared at GE at that time - thus informed her of referrant sensation.  Recommend  pt take her medications with applesauce-puree to maximize airway protection.  Also recommend soft foods for energy conservation if needed - and rest breaks if pt is dyspenic.  ?SLP Visit Diagnosis Dysphagia, oropharyngeal phase (R13.12)  ?Impact on safety and function Mild aspiration risk  ?Swallow Evaluation Recommendations  ?SLP Diet Recommendations Regular solids;Thin liquid ?(choose softer foods)  ?Liquid Administration via Straw;Cup  ?Medication Administration Whole meds with puree ?(start and follow with liquids)  ?Supervision Patient able to self feed  ?Compensations Small sips/bites ?(rest if dyspneic)  ?Postural Changes Remain semi-upright after after feeds/meals (Comment);Seated upright at 90 degrees  ?Individuals Consulted  ?Consulted and Agree with Results and Recommendations Patient  ?SLP Time Calculation  ?SLP Start Time (ACUTE ONLY) 1325  ?SLP Stop Time (ACUTE ONLY) 1345  ?SLP Time Calculation (min) (ACUTE ONLY) 20 min  ?SLP Evaluations  ?$ SLP Speech Visit 1 Visit  ?SLP Evaluations  ?$Outpatient MBS Swallow 1 Procedure  ?Kathleen Lime, MS CCC SLP ?Acute Rehab Services ?Office [phone] ?Pager [phone] ? ?

## 2021-06-21 ENCOUNTER — Encounter (HOSPITAL_COMMUNITY): Payer: PPO | Admitting: Internal Medicine

## 2021-06-21 ENCOUNTER — Other Ambulatory Visit (HOSPITAL_COMMUNITY): Payer: PPO

## 2021-06-25 NOTE — Op Note (Addendum)
PATIENT:  Caitlin Oliver 81 y.o.   DATE OF BIRTH: 30-Aug-1940  MEDICAL RECORD NUMBER: [id number]  PRE-OPERATIVE DIAGNOSIS:  RIGHT Tibia fracture  POST-OPERATIVE DIAGNOSIS:  RIGHT Tibia fracture  PROCEDURE:  Procedure(s): RIGHT TIBIAL SHAFT FRACTURE FIXATION (Left) with Biomet Versanail 10 X 300 mm, statically locked  SURGEON:  Surgeon(s) and Role:    Altamese Attalla, MD - Primary  ASSISTANTS: Ainsley Spinner, PA-C  ANESTHESIA:   none  EBL:  Minimal   BLOOD ADMINISTERED: None  DRAINS: None   LOCAL MEDICATIONS USED:  NONE  SPECIMEN:  No Specimen  DISPOSITION OF SPECIMEN:  N/A  COUNTS:  YES  TOURNIQUET:  * No tourniquets in log *  DICTATION: .Note written in EPIC  PLAN OF CARE: Admit to inpatient   PATIENT DISPOSITION:  PACU - hemodynamically stable.   Delay start of Pharmacological VTE agent (>24hrs) due to surgical blood loss or risk of bleeding: no  BRIEF SUMMARY AND INDICATIONS FOR PROCEDURE:  Caitlin Oliver is a 81 y.o. who sustained a tibia fracture from a fall related to syncope. Prolonged delay for surgical treatment because of pulmonary issues and delirium but now stable for surgery. Patient denied increasing pain or paresthesia. I also discussed with the patient the risks and benefits of surgery, including the possibility of infection, nerve injury, vessel injury, wound breakdown, arthritis, symptomatic hardware, DVT/ PE, loss of motion, malunion, nonunion, heart attack, stroke, prolonged intubation, and need for further surgery among others. These risks were acknowledged and consent given to proceed.  BRIEF SUMMARY OF PROCEDURE:  The patient was taken to the operating room after administration of Ancef for antibiotics.  The operative extremity was prepped and draped in the usual fashion.  No tourniquet was used during the procedure.  A 2.5-cm incision was made at the base of the distal pole of patella and extended proximally. A medial parapatellar incision was  made, and then the curved cannulated awl advanced into the center of the proximal tibia just medial to the lateral tibial spine and just anterior to the joint surface.  A guidewire was then advanced across the fracture site into the middle of the plafond and checked on AP and LAT images, measuring for nail length on the lateral.  We then performed sequential reaming, encountering chatter at 10 mm, reaming up to 1 mm and placing a 10 x 300 mm nail. We were careful to watch alignment throughout and make sure distal locking bolts were anterior to the fibula. After placing both the distal locks, back slapping was performed to interdigitate the fracture, which resulted in an excellent reduction. Two proximal locks were placed off the jig and checked for position and length.  An assistant was required for the procedure as my assistant performed the reaming and proximal instrumentation while I held reduction. Standard layered closure was performed. Ainsley Spinner, PA-C assisted during reaming and nail placement, as well as wound closure.  The patient was taken to the PACU in stable condition after application of sterile gently compressive dressings.  PROGNOSIS:  The patient will be weightbearing as tolerated with unrestricted motion of the knee and ankle for the next 6 weeks. CAM boot for support as needed. Resume antiplatelet meds for DVT prophylaxis. F/u in the office in 10-14 days for removal of sutures.     [name]. Marcelino Scot, M.D.

## 2021-07-10 NOTE — Progress Notes (Signed)
? ?ADVANCED HF CLINIC NOTE ? ? ?Primary Cardiologist: Dr Gwenlyn Found ?Pulmonary: Dr Melvyn Novas  ?HF Cardiologist: Dr. Haroldine Laws ? ?HPI ?Caitlin Oliver is an 81 y.o. with history of COPD, DM, PAD, LCEA 2015, R CEA 2014, GERD, hyperlipidemia, anxiety, HTN,chronic respiratory failure on 2 liters at home, lung cancer, S/P L upper lobectomy 2017, and chronic diastolic heart failure.  ? ?Admitted 06/16/18 with difficulty walking with ataxia. Code Stroke called. R precentral gyrus 5 mm ischemic stroke noted. Discharged on oxygen. Discharged on asa and plavix. She was later discharged to SNF for an extended stay.  ? ?Admitted to [facility] on 11/13/18  with increased shortness of breath and chest pain. Diuresed with IV lasix and transitioned to lasix 40 mg twice a day. Had RHC/LHC with mod pulmonary htn and elevated filling pressures. ? ?S/p repair 06/19/19 with right carotid exposure with common carotid and innominate artery stenting as well as subclavian artery stenting via brachial approach by Dr. Carlis Abbott. ? ?Follow up 7/22 feeling good, stable chronic NYHA III-IIIb symptoms. Mildly volume up and spiro started.   ? ?PCP follow up 03/16/21 for worsening fatigue. Flu neg, started on abx for sinus infection. Seen in ED 03/20/21 for SOB and chest tightness. CXR reassuring, low concern for HF or COPD exacerbation. Labs showed mild AKI 2.37.  ? ?Follow up 12/22, having more falls. Continued to refused pulmonary rehab. Stable NYHA III-IIIb symptoms, volume OK on torsemide 40 bid. ? ?Admitted 1/23 with R fib fracture after sustaining a fall. Briefly in ICU due to hypotension. Echo showed EF 65-70%. Incidentally found to have acute CVA. Neuro consulted and rec short course of DAPT, then plavix as monotherapy. Underwent intramdeullary nail repair of fracture. Discharged to facility for rehab. ? ?Today she returns for HF follow up. She is at rehab, hoping to go home after. Remains on 4 L oxygen. Overall feeling fine. Able to work with PT, remains SOB with  exertion. RLE more swollen and red. Dizzy with position changes. Denies abnormal bleeding, palpitations, CP, or PND/Orthopnea. Appetite ok. No fever or chills. Taking all medications provided by facility.  ? ?Cardiac Studies: ?- Echo (1/23): EF 65 to 70%, grade 1 DD, severely elevated pulmonary artery systolic pressure, RVSP 30.8 mmHg ? ?- Echo (7/20): EF 65%  Moderate Asymmetric hypertrophy septal wall. RV mild reduced. Small pericardial effusion. ? ?- RHC (3/21): RA 9, PAP 50/15 (30), PCWP 17, PVR 2.5 WU, CO/CI 3.9/2.1  ? ?PFTs 11/20 ?FEV1 1.25 (62%) ?FVC 2.03 (75%) ?DLCO 51% ? ?PFTs 8/17 ?FEV1 1.77 (84%) ?FVC 2.49 (88%) ?DLCO 69% ? ?RHC/LHC  11/20/2018  ?RA 11 ?PAP 79/39 (52)  ?PCWP 31 ?PVR 4.9  ?CO 4.3  ?CI 2.9  ?Mild to moderate, non-obstructive coronary artery disease. ?Severe pulmonary hypertension. ?Moderately to severely elevated left and right heart filling pressures. ?Mildly reduced Fick cardiac output/index. ?Severe right brachiocephalic and subclavian artery stenoses with combined gradient of ~50 mmHg and possible reversal of flow in the right vertebral artery (subclavian steal). ? ?Past Medical History:  ?Diagnosis Date  ? Anxiety   ? Arthritis   ? Cancer [facility])   ? lung cancer  ? Carotid artery disease ([place])   ? Cataracts, bilateral   ? CHF (congestive heart failure) ([place])   ? Claudication [facility])   ? COPD (chronic obstructive pulmonary disease) ([place])   ? Diabetes mellitus   ? Dizzy   ? Family history of adverse reaction to anesthesia   ? Pt nephew has PONV  ? GERD (gastroesophageal reflux disease)   ?  Headache   ? History of kidney stones   ? Hypercholesteremia   ? Hypertension   ? Peripheral arterial disease ([place])   ? bilateral iliac artery stenosis by angiography  ? Pneumonia   ? Stomach ulcer   ? Stroke [facility])   ? Tobacco abuse   ? Wears glasses   ? ?Current Outpatient Medications  ?Medication Sig Dispense Refill  ? acetaminophen (TYLENOL) 500 MG tablet Take 500 mg by mouth every 6 (six) hours as needed  for moderate pain.    ? albuterol (PROVENTIL HFA;VENTOLIN HFA) 108 (90 Base) MCG/ACT inhaler Inhale 2 puffs into the lungs every 6 (six) hours as needed for wheezing or shortness of breath.     ? albuterol (PROVENTIL) (2.5 MG/3ML) 0.083% nebulizer solution Take 2.5 mg by nebulization in the morning and at bedtime.    ? Ascorbic Acid (VITAMIN C) 1000 MG tablet Take 1,000 mg by mouth daily.    ? atorvastatin (LIPITOR) 40 MG tablet Take 1 tablet (40 mg total) by mouth daily.    ? Biotin 1000 MCG tablet Take 1,000 mcg by mouth daily.    ? chlorhexidine (PERIDEX) 0.12 % solution 15 mLs by Mouth Rinse route 2 (two) times daily. 120 mL 0  ? potassium chloride SA (KLOR-CON M) 20 MEQ tablet Take 20 mEq by mouth daily.    ? torsemide (DEMADEX) 20 MG tablet Take 20 mg by mouth 2 (two) times daily.    ? cholecalciferol (VITAMIN D) 25 MCG tablet Take 2 tablets (2,000 Units total) by mouth 2 (two) times daily.    ? clopidogrel (PLAVIX) 75 MG tablet Take 1 tablet (75 mg total) by mouth daily. 30 tablet 2  ? docusate sodium (COLACE) 100 MG capsule Take 100 mg by mouth 2 (two) times daily as needed for mild constipation.    ? ferrous sulfate 325 (65 FE) MG tablet Take 325 mg by mouth daily with breakfast.    ? FLUoxetine (PROZAC) 20 MG capsule Take 20 mg by mouth daily.    ? HYDROcodone-acetaminophen (NORCO/VICODIN) 5-325 MG tablet Take 1 tablet by mouth every 6 (six) hours as needed for moderate pain. 15 tablet 0  ? Magnesium 250 MG TABS Take 250 mg by mouth daily.    ? metFORMIN (GLUCOPHAGE) 500 MG tablet Take by mouth 2 (two) times daily with a meal.    ? metoprolol succinate (TOPROL-XL) 25 MG 24 hr tablet Take 0.5 tablets (12.5 mg total) by mouth daily.    ? Multiple Vitamin (MULTIVITAMIN WITH MINERALS) TABS tablet Take 1 tablet by mouth daily.    ? naphazoline-glycerin (CLEAR EYES REDNESS) 0.012-0.25 % SOLN 1-2 drops 4 (four) times daily as needed for eye irritation.    ? Nutritional Supplements (,FEEDING SUPPLEMENT, PROSOURCE  PLUS) liquid Take 30 mLs by mouth 2 (two) times daily between meals.    ? ONE TOUCH ULTRA TEST test strip     ? OXYGEN Inhale into the lungs. 3 liters n/c    ? pantoprazole (PROTONIX) 40 MG tablet Take 40 mg by mouth 2 (two) times daily.     ? polyethylene glycol (MIRALAX / GLYCOLAX) 17 g packet Take 17 g by mouth daily as needed for mild constipation.    ? revefenacin (YUPELRI) 175 MCG/3ML nebulizer solution Take 3 mLs (175 mcg total) by nebulization daily. (Patient not taking: Reported on 05/12/2021) 30 mL 5  ? senna (SENOKOT) 8.6 MG TABS tablet Take 1 tablet (8.6 mg total) by mouth daily. 120 tablet 0  ?  zinc sulfate 220 (50 Zn) MG capsule Take 1 capsule (220 mg total) by mouth daily.    ? ?No current facility-administered medications for this encounter.  ? ?Allergies  ?Allergen Reactions  ? Fentanyl Shortness Of Breath  ? ?Social History  ? ?Socioeconomic History  ? Marital status: Widowed  ?  Spouse name: Not on file  ? Number of children: Not on file  ? Years of education: Not on file  ? Highest education level: Not on file  ?Occupational History  ? Not on file  ?Tobacco Use  ? Smoking status: Former  ?  Packs/day: 1.50  ?  Years: 56.00  ?  Pack years: 84.00  ?  Types: Cigarettes  ?  Quit date: 07/07/2013  ?  Years since quitting: 8.0  ? Smokeless tobacco: Former  ?  Quit date: 02/25/2013  ?Vaping Use  ? Vaping Use: Some days  ?Substance and Sexual Activity  ? Alcohol use: No  ? Drug use: No  ? Sexual activity: Never  ?Other Topics Concern  ? Not on file  ?Social History Narrative  ? Not on file  ? ?Social Determinants of Health  ? ?Financial Resource Strain: Not on file  ?Food Insecurity: Not on file  ?Transportation Needs: Not on file  ?Physical Activity: Not on file  ?Stress: Not on file  ?Social Connections: Not on file  ?Intimate Partner Violence: Not on file  ? ?Family History  ?Problem Relation Age of Onset  ? Stroke Mother   ? Hypertension Mother   ? Kidney failure Father   ? Lung disease Neg Hx   ? ?BP  120/66   Pulse 85   Wt 73.2 kg (161 lb 6.4 oz)   SpO2 97%   BMI 27.70 kg/m?  ? ?Wt Readings from Last 3 Encounters:  ?07/12/21 73.2 kg (161 lb 6.4 oz)  ?05/12/21 79.3 kg (174 lb 13.2 oz)  ?05/05/21 73.5 kg

## 2021-07-12 ENCOUNTER — Encounter (HOSPITAL_COMMUNITY): Payer: Self-pay

## 2021-07-12 ENCOUNTER — Ambulatory Visit (HOSPITAL_COMMUNITY)
Admission: RE | Admit: 2021-07-12 | Discharge: 2021-07-12 | Disposition: A | Discharge status: Home / Self Care | Payer: PPO | Source: Ambulatory Visit | Attending: Family Medicine | Admitting: Family Medicine

## 2021-07-12 ENCOUNTER — Other Ambulatory Visit: Payer: Self-pay

## 2021-07-12 VITALS — BP 120/66 | HR 85 | Wt 161.4 lb

## 2021-07-12 DIAGNOSIS — E785 Hyperlipidemia, unspecified: Secondary | ICD-10-CM | POA: Diagnosis not present

## 2021-07-12 DIAGNOSIS — L03115 Cellulitis of right lower limb: Secondary | ICD-10-CM | POA: Insufficient documentation

## 2021-07-12 DIAGNOSIS — I739 Peripheral vascular disease, unspecified: Secondary | ICD-10-CM

## 2021-07-12 DIAGNOSIS — I5042 Chronic combined systolic (congestive) and diastolic (congestive) heart failure: Secondary | ICD-10-CM

## 2021-07-12 DIAGNOSIS — I69998 Other sequelae following unspecified cerebrovascular disease: Secondary | ICD-10-CM | POA: Diagnosis not present

## 2021-07-12 DIAGNOSIS — Z95820 Peripheral vascular angioplasty status with implants and grafts: Secondary | ICD-10-CM | POA: Insufficient documentation

## 2021-07-12 DIAGNOSIS — R29818 Other symptoms and signs involving the nervous system: Secondary | ICD-10-CM | POA: Diagnosis not present

## 2021-07-12 DIAGNOSIS — I5032 Chronic diastolic (congestive) heart failure: Secondary | ICD-10-CM | POA: Insufficient documentation

## 2021-07-12 DIAGNOSIS — R42 Dizziness and giddiness: Secondary | ICD-10-CM | POA: Insufficient documentation

## 2021-07-12 DIAGNOSIS — K219 Gastro-esophageal reflux disease without esophagitis: Secondary | ICD-10-CM | POA: Diagnosis not present

## 2021-07-12 DIAGNOSIS — F419 Anxiety disorder, unspecified: Secondary | ICD-10-CM | POA: Diagnosis not present

## 2021-07-12 DIAGNOSIS — I2721 Secondary pulmonary arterial hypertension: Secondary | ICD-10-CM

## 2021-07-12 DIAGNOSIS — J9611 Chronic respiratory failure with hypoxia: Secondary | ICD-10-CM | POA: Diagnosis not present

## 2021-07-12 DIAGNOSIS — Z85118 Personal history of other malignant neoplasm of bronchus and lung: Secondary | ICD-10-CM | POA: Diagnosis not present

## 2021-07-12 DIAGNOSIS — J449 Chronic obstructive pulmonary disease, unspecified: Secondary | ICD-10-CM | POA: Diagnosis not present

## 2021-07-12 DIAGNOSIS — Z79899 Other long term (current) drug therapy: Secondary | ICD-10-CM | POA: Insufficient documentation

## 2021-07-12 DIAGNOSIS — Z9981 Dependence on supplemental oxygen: Secondary | ICD-10-CM | POA: Diagnosis not present

## 2021-07-12 DIAGNOSIS — Z7902 Long term (current) use of antithrombotics/antiplatelets: Secondary | ICD-10-CM | POA: Diagnosis not present

## 2021-07-12 DIAGNOSIS — E1151 Type 2 diabetes mellitus with diabetic peripheral angiopathy without gangrene: Secondary | ICD-10-CM | POA: Insufficient documentation

## 2021-07-12 DIAGNOSIS — J189 Pneumonia, unspecified organism: Secondary | ICD-10-CM | POA: Insufficient documentation

## 2021-07-12 DIAGNOSIS — I3139 Other pericardial effusion (noninflammatory): Secondary | ICD-10-CM | POA: Diagnosis not present

## 2021-07-12 DIAGNOSIS — Z902 Acquired absence of lung [part of]: Secondary | ICD-10-CM | POA: Diagnosis not present

## 2021-07-12 DIAGNOSIS — I2510 Atherosclerotic heart disease of native coronary artery without angina pectoris: Secondary | ICD-10-CM | POA: Diagnosis not present

## 2021-07-12 DIAGNOSIS — I110 Hypertensive heart disease with heart failure: Secondary | ICD-10-CM | POA: Diagnosis present

## 2021-07-12 DIAGNOSIS — I272 Pulmonary hypertension, unspecified: Secondary | ICD-10-CM | POA: Insufficient documentation

## 2021-07-12 DIAGNOSIS — Z792 Long term (current) use of antibiotics: Secondary | ICD-10-CM | POA: Diagnosis not present

## 2021-07-12 LAB — BASIC METABOLIC PANEL
Anion gap: 10 (ref 5–15)
BUN: 28 mg/dL — ABNORMAL HIGH (ref 8–23)
CO2: 28 mmol/L (ref 22–32)
Calcium: 9.4 mg/dL (ref 8.9–10.3)
Chloride: 97 mmol/L — ABNORMAL LOW (ref 98–111)
Creatinine, Ser: 1.97 mg/dL — ABNORMAL HIGH (ref 0.44–1.00)
GFR, Estimated: 25 mL/min — ABNORMAL LOW (ref >60)
Glucose, Bld: 250 mg/dL — ABNORMAL HIGH (ref 70–99)
Potassium: 4.6 mmol/L (ref 3.5–5.1)
Sodium: 135 mmol/L (ref 135–145)

## 2021-07-12 LAB — CBC
HCT: 36.7 % (ref 36.0–46.0)
Hemoglobin: 12.3 g/dL (ref 12.0–15.0)
MCH: 30.8 pg (ref 26.0–34.0)
MCHC: 33.5 g/dL (ref 30.0–36.0)
MCV: 91.8 fL (ref 80.0–100.0)
Platelets: 284 10*3/uL (ref 150–400)
RBC: 4 MIL/uL (ref 3.87–5.11)
RDW: 11.4 % — ABNORMAL LOW (ref 11.5–15.5)
WBC: 9.9 10*3/uL (ref 4.0–10.5)
nRBC: 0 % (ref 0.0–0.2)

## 2021-07-12 LAB — BRAIN NATRIURETIC PEPTIDE: B Natriuretic Peptide: 80.9 pg/mL (ref 0.0–100.0)

## 2021-07-12 MED ORDER — TORSEMIDE 20 MG PO TABS: 40.0000 mg | ORAL_TABLET | Freq: Two times a day (BID) | ORAL | 6 refills | Status: DC

## 2021-07-12 NOTE — Patient Instructions (Addendum)
Thank you for coming in today ? ?Labs were done today, if any labs are abnormal the clinic will call you ?No news is good news ? ?INCREASE Torsemide to 40 mg 2 tablets twice daily ? ?PLEASE DRAW BMP in 2 weeks  ? ?Your physician recommends that you schedule a follow-up appointment in:  ?6 weeks in clinic  ?3-4 months with Dr. Haroldine Laws ? ?At the [facility], you and your health needs are our priority. As part of our continuing mission to provide you with exceptional heart care, we have created designated Provider Care Teams. These Care Teams include your primary Cardiologist (physician) and Advanced Practice Providers (APPs- Physician Assistants and Nurse Practitioners) who all work together to provide you with the care you need, when you need it.  ? ?You may see any of the following providers on your designated Care Team at your next follow up: ?Dr Glori Bickers ?Dr Loralie Champagne ?Darrick Grinder, NP ?Lyda Jester, PA ?Jessica Milford,NP ?Marlyce Huge, PA ?Audry Riles, PharmD ? ? ?Please be sure to bring in all your medications bottles to every appointment.  ? ?If you have any questions or concerns before your next appointment please send [place] a message through [place] or call our office at [phone].   ? ?TO LEAVE A MESSAGE FOR THE NURSE SELECT OPTION 2, PLEASE LEAVE A MESSAGE INCLUDING: ?YOUR NAME ?DATE OF BIRTH ?CALL BACK NUMBER ?REASON FOR CALL**this is important as we prioritize the call backs ? ?YOU WILL RECEIVE A CALL BACK THE SAME DAY AS LONG AS YOU CALL BEFORE 4:00 PM ? ?

## 2021-07-31 ENCOUNTER — Institutional Professional Consult (permissible substitution): Payer: PPO | Admitting: Neurology

## 2021-08-04 ENCOUNTER — Ambulatory Visit: Payer: PPO | Admitting: Cardiovascular Disease

## 2021-08-23 ENCOUNTER — Encounter (HOSPITAL_COMMUNITY): Payer: PPO

## 2021-08-28 ENCOUNTER — Encounter (HOSPITAL_COMMUNITY): Payer: PPO

## 2021-09-07 ENCOUNTER — Ambulatory Visit: Payer: PPO | Admitting: Cardiovascular Disease

## 2021-09-07 ENCOUNTER — Encounter: Payer: Self-pay | Admitting: Cardiovascular Disease

## 2021-09-07 ENCOUNTER — Ambulatory Visit (INDEPENDENT_AMBULATORY_CARE_PROVIDER_SITE_OTHER): Payer: PPO | Admitting: Cardiovascular Disease

## 2021-09-07 VITALS — BP 116/56 | HR 65 | Ht 64.0 in | Wt 158.6 lb

## 2021-09-07 DIAGNOSIS — Z720 Tobacco use: Secondary | ICD-10-CM

## 2021-09-07 DIAGNOSIS — I10 Essential (primary) hypertension: Secondary | ICD-10-CM

## 2021-09-07 DIAGNOSIS — I739 Peripheral vascular disease, unspecified: Secondary | ICD-10-CM

## 2021-09-07 DIAGNOSIS — E782 Mixed hyperlipidemia: Secondary | ICD-10-CM

## 2021-09-07 DIAGNOSIS — G458 Other transient cerebral ischemic attacks and related syndromes: Secondary | ICD-10-CM

## 2021-09-07 DIAGNOSIS — I272 Pulmonary hypertension, unspecified: Secondary | ICD-10-CM | POA: Insufficient documentation

## 2021-09-07 DIAGNOSIS — I6523 Occlusion and stenosis of bilateral carotid arteries: Secondary | ICD-10-CM

## 2021-09-07 NOTE — Assessment & Plan Note (Signed)
Long history of tobacco use having quit proxy 8 years ago with severe COPD, oxygen dependent.

## 2021-09-07 NOTE — Assessment & Plan Note (Signed)
History of hyperlipidemia on statin therapy with lipid profile performed 05/17/2021 revealing total cholesterol 135, LDL 67 and HDL of 31.

## 2021-09-07 NOTE — Assessment & Plan Note (Signed)
History of essential hypertension a blood pressure measured today at 116/56.  She is on metoprolol.

## 2021-09-07 NOTE — Assessment & Plan Note (Signed)
History of bilateral carotid endarterectomies performed by Dr. Trula Slade.  She had right carotid done in 2014 left carotid in 2015.

## 2021-09-07 NOTE — Assessment & Plan Note (Signed)
History of right innominate and common carotid stenting by Dr. Carlis Abbott in 2020 which he follows.

## 2021-09-07 NOTE — Patient Instructions (Signed)
Medication Instructions:  Your physician recommends that you continue on your current medications as directed. Please refer to the Current Medication list given to you today.  *If you need a refill on your cardiac medications before your next appointment, please call your pharmacy*   Testing/Procedures: Your physician has requested that you have a lower extremity arterial duplex. This test is an ultrasound of the arteries in the legs. It looks at arterial blood flow in the legs. Allow one hour for Lower Arterial scans. There are no restrictions or special instructions  Your physician has requested that you have an ankle brachial index (ABI). During this test an ultrasound and blood pressure cuff are used to evaluate the arteries that supply the arms and legs with blood. Allow thirty minutes for this exam. There are no restrictions or special instructions. These procedures will be done at [place]   Follow-Up: At [facility], you and your health needs are our priority.  As part of our continuing mission to provide you with exceptional heart care, we have created designated Provider Care Teams.  These Care Teams include your primary Cardiologist (physician) and Advanced Practice Providers (APPs -  Physician Assistants and Nurse Practitioners) who all work together to provide you with the care you need, when you need it.  We recommend signing up for the patient portal called "MyChart".  Sign up information is provided on this After Visit Summary.  MyChart is used to connect with patients for Virtual Visits (Telemedicine).  Patients are able to view lab/test results, encounter notes, upcoming appointments, etc.  Non-urgent messages can be sent to your provider as well.   To learn more about what you can do with MyChart, go to [email or web address].    Your next appointment:   12 month(s)  The format for your next appointment:   In Person  Provider:   Quay Burow, MD

## 2021-09-07 NOTE — Progress Notes (Signed)
09/07/2021 Caitlin Oliver   [birth year]  [id number]  Primary Physician Jodi Marble, MD Primary Cardiologist: Lorretta Harp MD FACP, [place], [place], [place]  HPI:  Caitlin Oliver is a 81 y.o.  mildly overweight widowed Caucasian female no children (son died 64 years ago) referred by Emmie Niemann PA-C at [facility] at [facility] for evaluation and treatment of lifestyle limiting claudication. I last saw her virtually for a video telemedicine visit 10/03/2018..   Her .Risk factors included hypertension, hyperlipidemia and type 2 diabetes. She has a 75-pack-year history of tobacco abuse smoking one and a half packs a day however she has recently quit smoking prior to her interventions.. There's no family history of heart disease. She's never had a heart for stroke. She does complain of dyspnea but denies chest pain. She complains of bilateral lower extremity claudication while walking up an incline which is lifestyle limiting. I age of Imdur 02/25/13 revealing high-grade bilateral internal carotid artery stenosis, high-grade bilateral calcified iliac artery stenosis as most common femoral artery stenoses. She had staged right followed by left carotid endarterectomy performed by Dr. Trula Slade which she has recovered from. Because of lifestyle limiting claudication she underwent diamondback orbital rotational atherectomy, PT A. And stenting using ICast covered stents by myself on 07/27/13 which resulted in complete resolution of her claudication symptoms. Her Dopplers normalized as well though she did have a high-frequency signal in the right common femoral artery. She has had a GI bleed in the past and her Plavix was discontinued. She denies chest pain, shortness of breath as well as claudication. She has had left subclavian stenting by Dr. Trula Slade 11/15/15. In addition, she had left upper lobectomy by Dr. Roxan Hockey 02/03/16 because of stage IA squamous cell carcinoma. She has recovered from  this.    Since I saw her 3 years ago she did have a right left heart cath by Dr. Saunders Revel revealing mild to moderate nonobstructive disease.  She had superior right brachiocephalic and subclavian artery stenosis with a combined gradient of 50 mmHg as well as severe pulm hypertension.  She ultimately had right common carotid and innominate stenting by Dr. Carlis Abbott.  She is followed by Dr. Haroldine Laws for pulmonary hypertension.  She is doing well from a lower extremity PAD point of view status post orbital atherectomy and covered stenting approximately 9 years ago.  She does have COPD on 24/7 oxygen.   Current Meds  Medication Sig   acetaminophen (TYLENOL) 500 MG tablet Take 500 mg by mouth every 6 (six) hours as needed for moderate pain.   albuterol (PROVENTIL HFA;VENTOLIN HFA) 108 (90 Base) MCG/ACT inhaler Inhale 2 puffs into the lungs every 6 (six) hours as needed for wheezing or shortness of breath.    albuterol (PROVENTIL) (2.5 MG/3ML) 0.083% nebulizer solution Take 2.5 mg by nebulization in the morning and at bedtime.   Ascorbic Acid (VITAMIN C) 1000 MG tablet Take 1,000 mg by mouth daily.   atorvastatin (LIPITOR) 40 MG tablet Take 1 tablet (40 mg total) by mouth daily.   Biotin 1000 MCG tablet Take 1,000 mcg by mouth daily.   carboxymethylcellul-glycerin (REFRESH OPTIVE) 0.5-0.9 % ophthalmic solution 1 drop as needed for dry eyes.   Cholecalciferol (VITAMIN D3 PO) Take 50 mcg by mouth daily. 2 capsules daily   clopidogrel (PLAVIX) 75 MG tablet Take by mouth.   cyclobenzaprine (FLEXERIL) 5 MG tablet Take 5 mg by mouth as needed.   docusate sodium (COLACE) 100 MG capsule Take  100 mg by mouth 2 (two) times daily as needed for mild constipation.   escitalopram (LEXAPRO) 10 MG tablet Take 10 mg by mouth daily.   ferrous sulfate 325 (65 FE) MG tablet Take 325 mg by mouth daily with breakfast.   HYDROcodone-acetaminophen (NORCO/VICODIN) 5-325 MG tablet Take 1 tablet by mouth every 6 (six) hours as needed  for moderate pain.   hydrOXYzine (ATARAX) 25 MG tablet Take by mouth.   Magnesium 250 MG TABS Take 250 mg by mouth daily.   meclizine (ANTIVERT) 25 MG tablet Take 25 mg by mouth daily as needed.   METOPROLOL SUCCINATE PO Take 50 mg by mouth 2 (two) times daily.   naphazoline-glycerin (CLEAR EYES REDNESS) 0.012-0.25 % SOLN 1-2 drops 4 (four) times daily as needed for eye irritation.   ondansetron (ZOFRAN) 4 MG tablet Take 4 mg by mouth once. mornings   ONE TOUCH ULTRA TEST test strip    OXYGEN Inhale 4 L into the lungs. 3 liters n/c   pantoprazole (PROTONIX) 40 MG tablet Take 40 mg by mouth 2 (two) times daily.   polyethylene glycol (MIRALAX / GLYCOLAX) 17 g packet Take 17 g by mouth daily as needed for mild constipation.   potassium chloride SA (KLOR-CON M) 20 MEQ tablet Take 20 mEq by mouth daily.   senna (SENOKOT) 8.6 MG TABS tablet Take 1 tablet (8.6 mg total) by mouth daily.   torsemide (DEMADEX) 20 MG tablet Take 2 tablets (40 mg total) by mouth 2 (two) times daily.   TRADJENTA 5 MG TABS tablet Take 5 mg by mouth daily.   umeclidinium bromide (INCRUSE ELLIPTA) 62.5 MCG/ACT AEPB Inhale 1 puff into the lungs daily.   [DISCONTINUED] cholecalciferol (VITAMIN D) 25 MCG tablet Take 2 tablets (2,000 Units total) by mouth 2 (two) times daily.     Allergies  Allergen Reactions   Fentanyl Shortness Of Breath    Social History   Socioeconomic History   Marital status: Widowed    Spouse name: Not on file   Number of children: Not on file   Years of education: Not on file   Highest education level: Not on file  Occupational History   Not on file  Tobacco Use   Smoking status: Former    Packs/day: 1.50    Years: 56.00    Pack years: 84.00    Types: Cigarettes    Quit date: 07/07/2013    Years since quitting: 8.1   Smokeless tobacco: Former    Quit date: 02/25/2013  Vaping Use   Vaping Use: Some days  Substance and Sexual Activity   Alcohol use: No   Drug use: No   Sexual  activity: Never  Other Topics Concern   Not on file  Social History Narrative   Not on file   Social Determinants of Health   Financial Resource Strain: Not on file  Food Insecurity: Not on file  Transportation Needs: Not on file  Physical Activity: Not on file  Stress: Not on file  Social Connections: Not on file  Intimate Partner Violence: Not on file     Review of Systems: General: negative for chills, fever, night sweats or weight changes.  Cardiovascular: negative for chest pain, dyspnea on exertion, edema, orthopnea, palpitations, paroxysmal nocturnal dyspnea or shortness of breath Dermatological: negative for rash Respiratory: negative for cough or wheezing Urologic: negative for hematuria Abdominal: negative for nausea, vomiting, diarrhea, bright red blood per rectum, melena, or hematemesis Neurologic: negative for visual changes, syncope, or  dizziness All other systems reviewed and are otherwise negative except as noted above.    Blood pressure (!) 116/56, pulse 65, height _0  (1.626 m), weight 158 lb 9.6 oz (71.9 kg), SpO2 96 %.  General appearance: alert and no distress Neck: no adenopathy, no carotid bruit, no JVD, supple, symmetrical, trachea midline, and thyroid not enlarged, symmetric, no tenderness/mass/nodules Lungs: clear to auscultation bilaterally Heart: regular rate and rhythm, S1, S2 normal, no murmur, click, rub or gallop Extremities: extremities normal, atraumatic, no cyanosis or edema Pulses: 2+ and symmetric Skin: Skin color, texture, turgor normal. No rashes or lesions Neurologic: Grossly normal  EKG sinus rhythm at 65 without ST or T wave changes.  Personally reviewed this EKG.  ASSESSMENT AND PLAN:   Essential hypertension History of essential hypertension a blood pressure measured today at 116/56.  She is on metoprolol.  Hyperlipidemia History of hyperlipidemia on statin therapy with lipid profile performed 05/17/2021 revealing total  cholesterol 135, LDL 67 and HDL of 31.  Tobacco abuse Long history of tobacco use having quit proxy 8 years ago with severe COPD, oxygen dependent.  Carotid artery obstruction History of bilateral carotid endarterectomies performed by Dr. Trula Slade.  She had right carotid done in 2014 left carotid in 2015.  Subclavian steal syndrome History of right innominate and common carotid stenting by Dr. Carlis Abbott in 2020 which he follows.  Pulmonary hypertension, unspecified ([place]) History of severe pulm hypertension with PA pressures in the 80s followed by Dr. Haroldine Laws.  She apparently is not a candidate for pulmonary vasodilators.     Lorretta Harp MD FACP,FACC,FAHA, [facility] 09/07/2021 11:38 AM

## 2021-09-07 NOTE — Assessment & Plan Note (Signed)
History of severe pulm hypertension with PA pressures in the 80s followed by Dr. Haroldine Laws.  She apparently is not a candidate for pulmonary vasodilators.

## 2021-09-21 NOTE — Progress Notes (Signed)
ADVANCED HF CLINIC NOTE   Primary Cardiologist: Dr Gwenlyn Found Pulmonary: Dr Melvyn Novas  HF Cardiologist: Dr. Haroldine Laws  HPI [name] is an 81 y.o. with history of COPD, DM, PAD, LCEA 2015, R CEA 2014, GERD, hyperlipidemia, anxiety, HTN,chronic respiratory failure on 2 liters at home, lung cancer, S/P L upper lobectomy 2017, and chronic diastolic heart failure.   Admitted 06/16/18 with difficulty walking with ataxia. Code Stroke called. R precentral gyrus 5 mm ischemic stroke noted. Discharged on oxygen. Discharged on asa and plavix. She was later discharged to SNF for an extended stay.   Admitted to [facility] on 11/13/18  with increased shortness of breath and chest pain. Diuresed with IV lasix and transitioned to lasix 40 mg twice a day. Had RHC/LHC with mod pulmonary htn and elevated filling pressures.  S/p repair 06/19/19 with right carotid exposure with common carotid and innominate artery stenting as well as subclavian artery stenting via brachial approach by Dr. Carlis Abbott.  Follow up 7/22 feeling good, stable chronic NYHA III-IIIb symptoms. Mildly volume up and spiro started.    PCP follow up 03/16/21 for worsening fatigue. Flu neg, started on abx for sinus infection. Seen in ED 03/20/21 for SOB and chest tightness. CXR reassuring, low concern for HF or COPD exacerbation. Labs showed mild AKI 2.37.   Follow up 12/22, having more falls. Continued to refused pulmonary rehab. Stable NYHA III-IIIb symptoms, volume OK.  Admitted 1/23 with R fibula fracture after sustaining a fall. Briefly in ICU due to hypotension and susepcted septic shock. Echo showed EF 65-70%. Incidentally found to have acute CVA. Neuro consulted and rec short course of DAPT, then plavix as monotherapy. Underwent intramdeullary nail repair of fracture. Discharged to facility for rehab.  Last seen for f/u 03/23. Volume appeared elevated. Torsemide increased to 40 mg BID. Dyspnea felt to be multifactorial.  She is here today for f/u. Reports  she was discharged from SNF in May. She has lost about 20 lb since January. Lost a significant amount of weight after hospitalization. Reports chronic dyspnea with exertion but reports breathing is actually the best it has been in a year. Stamina improved during rehab. She is interested in pulmonary rehab. No CP or palpitations. Has been watching fluid and sodium intake. She is taking all medications. When discharged from SNF she was placed on metoprolol 50 mg BID (we had on file 12.5 mg daily) and torsemide 80 BID (we had 40 mg BID). BP soft 90s today, averages 100s-110s at home.   Cardiac Studies: - Echo (1/23): EF 65 to 70%, grade 1 DD, severely elevated pulmonary artery systolic pressure, RVSP 62.7 mmHg  - Echo (7/20): EF 65%  Moderate Asymmetric hypertrophy septal wall. RV mild reduced. Small pericardial effusion.  - RHC (3/21): RA 9, PAP 50/15 (30), PCWP 17, PVR 2.5 WU, CO/CI 3.9/2.1   PFTs 11/20 FEV1 1.25 (62%) FVC 2.03 (75%) DLCO 51%  PFTs 8/17 FEV1 1.77 (84%) FVC 2.49 (88%) DLCO 69%  RHC/LHC  11/20/2018  RA 11 PAP 79/39 (52)  PCWP 31 PVR 4.9  CO 4.3  CI 2.9  Mild to moderate, non-obstructive coronary artery disease. Severe pulmonary hypertension. Moderately to severely elevated left and right heart filling pressures. Mildly reduced Fick cardiac output/index. Severe right brachiocephalic and subclavian artery stenoses with combined gradient of ~50 mmHg and possible reversal of flow in the right vertebral artery (subclavian steal).  Past Medical History:  Diagnosis Date   Anxiety    Arthritis    Cancer ([place])  lung cancer   Carotid artery disease (HCC)    Cataracts, bilateral    CHF (congestive heart failure) (HCC)    Claudication (HCC)    COPD (chronic obstructive pulmonary disease) (HCC)    Diabetes mellitus    Dizzy    Family history of adverse reaction to anesthesia    Pt nephew has PONV   GERD (gastroesophageal reflux disease)    Headache    History of  kidney stones    Hypercholesteremia    Hypertension    Peripheral arterial disease (HCC)    bilateral iliac artery stenosis by angiography   Pneumonia    Stomach ulcer    Stroke (HCC)    Tobacco abuse    Wears glasses    Current Outpatient Medications  Medication Sig Dispense Refill   acetaminophen (TYLENOL) 500 MG tablet Take 500 mg by mouth every 6 (six) hours as needed for moderate pain.     albuterol (PROVENTIL HFA;VENTOLIN HFA) 108 (90 Base) MCG/ACT inhaler Inhale 2 puffs into the lungs every 6 (six) hours as needed for wheezing or shortness of breath.      albuterol (PROVENTIL) (2.5 MG/3ML) 0.083% nebulizer solution Take 2.5 mg by nebulization in the morning and at bedtime.     Ascorbic Acid (VITAMIN C) 1000 MG tablet Take 1,000 mg by mouth daily.     atorvastatin (LIPITOR) 40 MG tablet Take 1 tablet (40 mg total) by mouth daily.     Biotin 1000 MCG tablet Take 1,000 mcg by mouth daily.     carboxymethylcellul-glycerin (REFRESH OPTIVE) 0.5-0.9 % ophthalmic solution 1 drop as needed for dry eyes.     Cholecalciferol (VITAMIN D3 PO) Take 50 mcg by mouth daily. 2 capsules daily     clopidogrel (PLAVIX) 75 MG tablet Take 75 mg by mouth daily.     cyclobenzaprine (FLEXERIL) 5 MG tablet Take 5 mg by mouth as needed.     docusate sodium (COLACE) 100 MG capsule Take 100 mg by mouth 2 (two) times daily as needed for mild constipation.     escitalopram (LEXAPRO) 10 MG tablet Take 10 mg by mouth daily.     ferrous sulfate 325 (65 FE) MG tablet Take 325 mg by mouth daily with breakfast.     HYDROcodone-acetaminophen (NORCO/VICODIN) 5-325 MG tablet Take 1 tablet by mouth every 6 (six) hours as needed for moderate pain. 15 tablet 0   hydrOXYzine (ATARAX) 25 MG tablet Take 25 mg by mouth every 8 (eight) hours as needed for itching.     Magnesium 250 MG TABS Take 250 mg by mouth daily.     meclizine (ANTIVERT) 25 MG tablet Take 25 mg by mouth daily as needed.     Multiple Vitamin (MULTIVITAMIN  WITH MINERALS) TABS tablet Take 1 tablet by mouth daily.     naphazoline-glycerin (CLEAR EYES REDNESS) 0.012-0.25 % SOLN 1-2 drops 4 (four) times daily as needed for eye irritation.     ondansetron (ZOFRAN) 4 MG tablet Take 4 mg by mouth daily as needed for nausea.     ONE TOUCH ULTRA TEST test strip      OXYGEN Inhale 4 L into the lungs.     pantoprazole (PROTONIX) 40 MG tablet Take 40 mg by mouth 2 (two) times daily.     polyethylene glycol (MIRALAX / GLYCOLAX) 17 g packet Take 17 g by mouth daily as needed for mild constipation.     potassium chloride SA (KLOR-CON M) 20 MEQ tablet Take 20 mEq by  mouth 2 (two) times daily.     torsemide (DEMADEX) 20 MG tablet Patient takes 4 tablets by mouth in the morning and 4 tablet in the evening.     TRADJENTA 5 MG TABS tablet Take 5 mg by mouth daily.     umeclidinium bromide (INCRUSE ELLIPTA) 62.5 MCG/ACT AEPB Inhale 1 puff into the lungs daily.     No current facility-administered medications for this encounter.   Allergies  Allergen Reactions   Fentanyl Shortness Of Breath   Social History   Socioeconomic History   Marital status: Widowed    Spouse name: Not on file   Number of children: Not on file   Years of education: Not on file   Highest education level: Not on file  Occupational History   Not on file  Tobacco Use   Smoking status: Former    Packs/day: 1.50    Years: 56.00    Total pack years: 84.00    Types: Cigarettes    Quit date: 07/07/2013    Years since quitting: 8.2   Smokeless tobacco: Former    Quit date: 02/25/2013  Vaping Use   Vaping Use: Some days  Substance and Sexual Activity   Alcohol use: No   Drug use: No   Sexual activity: Never  Other Topics Concern   Not on file  Social History Narrative   Not on file   Social Determinants of Health   Financial Resource Strain: Not on file  Food Insecurity: Not on file  Transportation Needs: Not on file  Physical Activity: Not on file  Stress: Not on file   Social Connections: Not on file  Intimate Partner Violence: Not on file   Family History  Problem Relation Age of Onset   Stroke Mother    Hypertension Mother    Kidney failure Father    Lung disease Neg Hx    BP (!) 90/56   Pulse 67   Wt 70.7 kg (155 lb 12.8 oz)   SpO2 97%   BMI 26.74 kg/m   Wt Readings from Last 3 Encounters:  09/22/21 70.7 kg (155 lb 12.8 oz)  09/07/21 71.9 kg (158 lb 9.6 oz)  07/12/21 73.2 kg (161 lb 6.4 oz)   PHYSICAL EXAM: General: Chronically ill appearing elderly female. Ambulated into clinic with a walker HEENT: normal Neck: supple. no JVD. Carotids 2+ bilat; no bruits. No lymphadenopathy or thryomegaly appreciated. Cor: PMI nondisplaced. Regular rate & rhythm. No rubs, gallops or murmurs. Lungs: diminished breath sounds, O2 satble on 3L O2 [place] Abdomen: soft, nontender, nondistended.  Extremities: no cyanosis, clubbing, rash, trace edema Neuro: alert & orientedx3, cranial nerves grossly intact. moves all 4 extremities w/o difficulty. Affect pleasant   ASSESSMENT & PLAN: 1. Pulmonary HTN, Mixed presentation.  - she has mixed PAH/PVH. RHC (8/20)  RA 11,PAP 79/39 (52) ,PCWP 31, PVR 4.8, CO 4.3 , CI 2.9 - RHC (3/21): RA 9, PAP 50/15 (30), PCWP 17, PVR 2.5 WU, CO/CI 3.9/2.1 - suspect combination of WHO Group II and III  - PFTs with obstructive/restrictive picture - Refused sleep study. - Referral submitted for pulmonary rehab - Not candidate for selective pulmonary artery vasodilators. - Continue wearing O2.   2. Chronic diastolic HF - Echo 6/78/93 EF 65% - Chronic NYHA IIIa. Volume okay, maybe on low end.  - Has been on 80 mg Torsemide BID since discharge from SNF in May (we had her on 40 BID), not sure when dose increased. Will likely need to cut  back dose pending today's labs. - On metoprolol 50 BID (we previously had 12.5 mg daily on file). Will switch to bisoprolol 2.5 mg daily given underlying COPD - Off spiro with elevated SCr.  - Labs  today  3. Chronic Hypoxic Respiratory Failure due to COPD and H/O Lung Cancer S/P LUL lobectomy  - On home oxygen. Continue. - Follow up with Pulmonary. Last visit cancelled in January d/t admit  4. Suspected Sleep Apnea  - She has refused sleep study.  5. Chronic dizziness - Due to previous CVA +/- posterior circulation insufficiency. - High fall risk.  6. Severe PAD  - Hx bilateral CEA by Dr. Trula Slade - Hx right innominate and common carotid stenting by Dr. Carlis Abbott in 2020 - s/p redo right common carotid artery exposure and retrograde stenting of her innominate and proximal common high-grade stenosis with kissing stent into the right subclavian 03/21 by Dr. Carlis Abbott.  - followed by [facility]  7. Hypotension - Suspect d/t meds - Changes as above  Follow up 3 months  Travares Nelles N PA-C 10:48 AM

## 2021-09-22 ENCOUNTER — Encounter (HOSPITAL_COMMUNITY): Payer: Self-pay

## 2021-09-22 ENCOUNTER — Telehealth (HOSPITAL_COMMUNITY): Payer: Self-pay

## 2021-09-22 ENCOUNTER — Ambulatory Visit (HOSPITAL_COMMUNITY)
Admission: RE | Admit: 2021-09-22 | Discharge: 2021-09-22 | Disposition: A | Discharge status: Home / Self Care | Payer: PPO | Source: Ambulatory Visit | Attending: Physician Assistant | Admitting: Physician Assistant

## 2021-09-22 VITALS — BP 90/56 | HR 67 | Wt 155.8 lb

## 2021-09-22 DIAGNOSIS — E1151 Type 2 diabetes mellitus with diabetic peripheral angiopathy without gangrene: Secondary | ICD-10-CM | POA: Insufficient documentation

## 2021-09-22 DIAGNOSIS — Z7902 Long term (current) use of antithrombotics/antiplatelets: Secondary | ICD-10-CM | POA: Diagnosis not present

## 2021-09-22 DIAGNOSIS — Z7984 Long term (current) use of oral hypoglycemic drugs: Secondary | ICD-10-CM | POA: Insufficient documentation

## 2021-09-22 DIAGNOSIS — Z79899 Other long term (current) drug therapy: Secondary | ICD-10-CM | POA: Diagnosis not present

## 2021-09-22 DIAGNOSIS — I2510 Atherosclerotic heart disease of native coronary artery without angina pectoris: Secondary | ICD-10-CM | POA: Insufficient documentation

## 2021-09-22 DIAGNOSIS — K219 Gastro-esophageal reflux disease without esophagitis: Secondary | ICD-10-CM | POA: Insufficient documentation

## 2021-09-22 DIAGNOSIS — I5032 Chronic diastolic (congestive) heart failure: Secondary | ICD-10-CM

## 2021-09-22 DIAGNOSIS — R42 Dizziness and giddiness: Secondary | ICD-10-CM | POA: Insufficient documentation

## 2021-09-22 DIAGNOSIS — Z9981 Dependence on supplemental oxygen: Secondary | ICD-10-CM | POA: Insufficient documentation

## 2021-09-22 DIAGNOSIS — F419 Anxiety disorder, unspecified: Secondary | ICD-10-CM | POA: Diagnosis not present

## 2021-09-22 DIAGNOSIS — I952 Hypotension due to drugs: Secondary | ICD-10-CM

## 2021-09-22 DIAGNOSIS — I69398 Other sequelae of cerebral infarction: Secondary | ICD-10-CM | POA: Diagnosis not present

## 2021-09-22 DIAGNOSIS — I272 Pulmonary hypertension, unspecified: Secondary | ICD-10-CM | POA: Insufficient documentation

## 2021-09-22 DIAGNOSIS — J9611 Chronic respiratory failure with hypoxia: Secondary | ICD-10-CM | POA: Insufficient documentation

## 2021-09-22 DIAGNOSIS — J449 Chronic obstructive pulmonary disease, unspecified: Secondary | ICD-10-CM | POA: Diagnosis not present

## 2021-09-22 DIAGNOSIS — Z87891 Personal history of nicotine dependence: Secondary | ICD-10-CM | POA: Diagnosis not present

## 2021-09-22 DIAGNOSIS — I110 Hypertensive heart disease with heart failure: Secondary | ICD-10-CM | POA: Insufficient documentation

## 2021-09-22 DIAGNOSIS — Z902 Acquired absence of lung [part of]: Secondary | ICD-10-CM | POA: Insufficient documentation

## 2021-09-22 DIAGNOSIS — E7800 Pure hypercholesterolemia, unspecified: Secondary | ICD-10-CM | POA: Insufficient documentation

## 2021-09-22 DIAGNOSIS — Z85118 Personal history of other malignant neoplasm of bronchus and lung: Secondary | ICD-10-CM | POA: Diagnosis not present

## 2021-09-22 DIAGNOSIS — E1136 Type 2 diabetes mellitus with diabetic cataract: Secondary | ICD-10-CM | POA: Diagnosis not present

## 2021-09-22 DIAGNOSIS — I959 Hypotension, unspecified: Secondary | ICD-10-CM | POA: Diagnosis not present

## 2021-09-22 LAB — BRAIN NATRIURETIC PEPTIDE: B Natriuretic Peptide: 74.1 pg/mL (ref 0.0–100.0)

## 2021-09-22 LAB — BASIC METABOLIC PANEL
Anion gap: 9 (ref 5–15)
BUN: 32 mg/dL — ABNORMAL HIGH (ref 8–23)
CO2: 31 mmol/L (ref 22–32)
Calcium: 9.4 mg/dL (ref 8.9–10.3)
Chloride: 96 mmol/L — ABNORMAL LOW (ref 98–111)
Creatinine, Ser: 1.9 mg/dL — ABNORMAL HIGH (ref 0.44–1.00)
GFR, Estimated: 26 mL/min — ABNORMAL LOW (ref >60)
Glucose, Bld: 154 mg/dL — ABNORMAL HIGH (ref 70–99)
Potassium: 4 mmol/L (ref 3.5–5.1)
Sodium: 136 mmol/L (ref 135–145)

## 2021-09-22 MED ORDER — TORSEMIDE 20 MG PO TABS: 40.0000 mg | ORAL_TABLET | Freq: Two times a day (BID) | ORAL | 3 refills | Status: DC

## 2021-09-22 MED ORDER — POTASSIUM CHLORIDE CRYS ER 20 MEQ PO TBCR: 20.0000 meq | EXTENDED_RELEASE_TABLET | Freq: Every day | ORAL | 3 refills | Status: DC

## 2021-09-22 MED ORDER — BISOPROLOL FUMARATE 5 MG PO TABS: 2.5000 mg | ORAL_TABLET | Freq: Every day | ORAL | 3 refills | Status: DC

## 2021-09-22 NOTE — Telephone Encounter (Signed)
-----  Message from Joette Catching, [place] sent at 09/22/2021 12:36 PM EDT ----- Scr elevated. Our prior med list had 40 torsemide BID, her med list from nursing home at discharge had Torsemide 80 mg BID (not sure when adjustment made).   She should decrease torsemide to 40 mg BID. Decrease potassium supplement to 20 mEq daily (taking 20 BID). BMET 10 days

## 2021-09-22 NOTE — Addendum Note (Signed)
Addended by: Rockwell Alexandria on: 09/22/2021 03:06 PM   Modules accepted: Orders

## 2021-09-22 NOTE — Patient Instructions (Signed)
STOP Metoprolol START Bisoprolol 2.5 mg (one half tab) daily  Labs today We will only contact you if something comes back abnormal or we need to make some changes. Otherwise no news is good news!  Your physician recommends that you schedule a follow-up appointment in: 3 months  in the Advanced Practitioners (PA/NP) Clinic    Do the following things EVERYDAY: Weigh yourself in the morning before breakfast. Write it down and keep it in a log. Take your medicines as prescribed Eat low salt foods--Limit salt (sodium) to 2000 mg per day.  Stay as active as you can everyday Limit all fluids for the day to less than 2 liters  At the [facility], you and your health needs are our priority. As part of our continuing mission to provide you with exceptional heart care, we have created designated Provider Care Teams. These Care Teams include your primary Cardiologist (physician) and Advanced Practice Providers (APPs- Physician Assistants and Nurse Practitioners) who all work together to provide you with the care you need, when you need it.   You may see any of the following providers on your designated Care Team at your next follow up: Dr Glori Bickers Dr Haynes Kerns, NP Ly[place] Jester, [place] [facility] [place], [place] Audry Riles, PharmD   Please be sure to bring in all your medications bottles to every appointment.

## 2021-09-22 NOTE — Telephone Encounter (Signed)
Lab order placed and lab appointment has been scheduled. Patient's med list has been up dated and changed  Pt aware, agreeable, and verLab order placed and lab appointment has been scheduled. Pt aware, agreeable, and verbalized understanding balized understanding       ----- Message from Joette Catching, PA-C sent at 09/22/2021 12:36 PM EDT ----- Scr elevated. Our prior med list had 40 torsemide BID, her med list from nursing home at discharge had Torsemide 80 mg BID (not sure when adjustment made).    She should decrease torsemide to 40 mg BID. Decrease potassium supplement to 20 mEq daily (taking 20 BID). BMET 10 days

## 2021-09-25 ENCOUNTER — Other Ambulatory Visit (HOSPITAL_COMMUNITY): Payer: Self-pay | Admitting: Cardiology

## 2021-09-25 DIAGNOSIS — I272 Pulmonary hypertension, unspecified: Secondary | ICD-10-CM

## 2021-09-25 NOTE — Progress Notes (Signed)
Referral updated with MD

## 2021-09-26 ENCOUNTER — Telehealth (HOSPITAL_COMMUNITY): Payer: Self-pay

## 2021-09-26 NOTE — Telephone Encounter (Signed)
Pt is unable to travel longer distance and is  unable to come to [facility] for pulmonary rehab. I advised pt of a facility in [place] and pt was stead on that is too far. Closed referral.

## 2021-10-03 ENCOUNTER — Ambulatory Visit (HOSPITAL_BASED_OUTPATIENT_CLINIC_OR_DEPARTMENT_OTHER)
Admission: RE | Admit: 2021-10-03 | Discharge: 2021-10-03 | Disposition: A | Discharge status: Home / Self Care | Payer: PPO | Source: Ambulatory Visit | Attending: Cardiovascular Disease | Admitting: Cardiovascular Disease

## 2021-10-03 ENCOUNTER — Ambulatory Visit (HOSPITAL_COMMUNITY)
Admission: RE | Admit: 2021-10-03 | Discharge: 2021-10-03 | Disposition: A | Discharge status: Home / Self Care | Payer: PPO | Source: Ambulatory Visit | Attending: Cardiovascular Disease | Admitting: Cardiovascular Disease

## 2021-10-03 DIAGNOSIS — I739 Peripheral vascular disease, unspecified: Secondary | ICD-10-CM | POA: Insufficient documentation

## 2021-10-03 DIAGNOSIS — Z95828 Presence of other vascular implants and grafts: Secondary | ICD-10-CM | POA: Diagnosis not present

## 2021-10-04 ENCOUNTER — Ambulatory Visit (HOSPITAL_COMMUNITY)
Admission: RE | Admit: 2021-10-04 | Discharge: 2021-10-04 | Disposition: A | Discharge status: Home / Self Care | Payer: PPO | Source: Ambulatory Visit | Attending: Internal Medicine | Admitting: Internal Medicine

## 2021-10-04 DIAGNOSIS — I5032 Chronic diastolic (congestive) heart failure: Secondary | ICD-10-CM | POA: Insufficient documentation

## 2021-10-04 LAB — BASIC METABOLIC PANEL
Anion gap: 10 (ref 5–15)
BUN: 22 mg/dL (ref 8–23)
CO2: 31 mmol/L (ref 22–32)
Calcium: 9.8 mg/dL (ref 8.9–10.3)
Chloride: 97 mmol/L — ABNORMAL LOW (ref 98–111)
Creatinine, Ser: 1.52 mg/dL — ABNORMAL HIGH (ref 0.44–1.00)
GFR, Estimated: 34 mL/min — ABNORMAL LOW (ref >60)
Glucose, Bld: 122 mg/dL — ABNORMAL HIGH (ref 70–99)
Potassium: 3.9 mmol/L (ref 3.5–5.1)
Sodium: 138 mmol/L (ref 135–145)

## 2021-10-10 ENCOUNTER — Telehealth (HOSPITAL_COMMUNITY): Payer: Self-pay | Admitting: Pharmacist

## 2021-10-10 NOTE — Telephone Encounter (Signed)
Received VM from patient stating she was experiencing rage after starting bisoprolol. She wanted to know if this was an adverse effect associated with this medication.   Although "rage" is not listed in the package insert, CNS effects can occur with bisoprolol, including depression, dizziness, fatigue, insomnia and in some cases, vivid dreams, hallucinations or psychosis.   Attempted to call patient but was unable to leave VM on her home phone. Called other number in her chart, which is her niece's number and left message asking for call back. Would stop bisoprolol and restart metoprolol succinate 12.5 mg daily as she has tolerated that medication in the past.   Karle Plumber, PharmD, BCPS, BCCP, CPP Heart Failure Clinic Pharmacist [phone]

## 2021-10-12 ENCOUNTER — Encounter (HOSPITAL_COMMUNITY): Payer: PPO | Admitting: Internal Medicine

## 2021-10-25 ENCOUNTER — Other Ambulatory Visit: Payer: Self-pay | Admitting: Physician Assistant

## 2021-10-25 DIAGNOSIS — R109 Unspecified abdominal pain: Secondary | ICD-10-CM

## 2021-11-03 ENCOUNTER — Encounter: Payer: Self-pay | Admitting: Podiatry

## 2021-11-03 ENCOUNTER — Ambulatory Visit: Payer: PPO | Admitting: Podiatry

## 2021-11-03 DIAGNOSIS — E1142 Type 2 diabetes mellitus with diabetic polyneuropathy: Secondary | ICD-10-CM

## 2021-11-03 NOTE — Progress Notes (Signed)
  Subjective:  Patient ID: Caitlin Oliver, female    DOB: [birth year],   MRN: [id number]  No chief complaint on file.   81 y.o. female presents for diabetic foot check. She is interested in diabetic shoes.  Relates burning and tingling in their feet. Patient is diabetic and last A1c was  Lab Results  Component Value Date   HGBA1C 6.6 (H) 05/17/2021   .   PCP:  [name], PA-C    . Denies any other pedal complaints. Denies n/v/f/c.   Past Medical History:  Diagnosis Date   Anxiety    Arthritis    Cancer ([place])    lung cancer   Carotid artery disease (HCC)    Cataracts, bilateral    CHF (congestive heart failure) (HCC)    Claudication (HCC)    COPD (chronic obstructive pulmonary disease) ([place])    Diabetes mellitus    Dizzy    Family history of adverse reaction to anesthesia    Pt nephew has PONV   GERD (gastroesophageal reflux disease)    Headache    History of kidney stones    Hypercholesteremia    Hypertension    Peripheral arterial disease (HCC)    bilateral iliac artery stenosis by angiography   Pneumonia    Stomach ulcer    Stroke ([place])    Tobacco abuse    Wears glasses     Objective:  Physical Exam: Vascular: DP/PT pulses 2/4 bilateral. CFT <3 seconds. Absent hair growth on digits. Edema noted to bilateral lower extremities. Xerosis noted bilaterally.  Skin. No lacerations or abrasions bilateral feet. Nails 1-5 bilateral normal in appearance.  Musculoskeletal: MMT 5/5 bilateral lower extremities in DF, PF, Inversion and Eversion. Deceased ROM in DF of ankle joint.  Neurological: Sensation intact to light touch. Protective sensation diminished bilateral.    Assessment:   1. Type 2 diabetes mellitus with diabetic polyneuropathy, unspecified whether long term insulin use ([place])      Plan:  Patient was evaluated and treated and all questions answered. -Discussed and educated patient on diabetic foot care, especially with  regards to the vascular,  neurological and musculoskeletal systems.  -Stressed the importance of good glycemic control and the detriment of not  controlling glucose levels in relation to the foot. -Discussed supportive shoes at all times and checking feet regularly.  -Will get appointment for fitting of DM shoes.  -Answered all patient questions -Patient to return  in 3 months for at risk foot care -Patient advised to call the office if any problems or questions arise in the meantime.   Lorenda Peck, DPM

## 2021-11-08 ENCOUNTER — Ambulatory Visit (INDEPENDENT_AMBULATORY_CARE_PROVIDER_SITE_OTHER): Payer: PPO

## 2021-11-08 DIAGNOSIS — E1142 Type 2 diabetes mellitus with diabetic polyneuropathy: Secondary | ICD-10-CM

## 2021-11-08 NOTE — Progress Notes (Signed)
Patient presents to the office today for diabetic shoe and insole measuring.  Patient was measured with brannock device to determine size and width for 1 pair of extra depth shoes and foam casted for 3 pair of insoles.   Documentation of medical necessity will be sent to patient's treating diabetic doctor to verify and sign.   Patient's diabetic provider: Bing Matter, PA [facility]  Shoes and insoles will be ordered at that time and patient will be notified for an appointment for fitting when they arrive.   Shoe size (per patient): 8.5 wide women's    Brannock measurement: 9 left foot 8.5 right foot   Patient shoe selection-   1st choice:   [place]  2nd choice:  [place] size ordered: 9 wide women's

## 2021-11-10 ENCOUNTER — Other Ambulatory Visit (HOSPITAL_COMMUNITY): Payer: Self-pay | Admitting: Cardiovascular Disease

## 2021-11-10 DIAGNOSIS — I708 Atherosclerosis of other arteries: Secondary | ICD-10-CM

## 2021-11-24 ENCOUNTER — Ambulatory Visit
Admission: RE | Admit: 2021-11-24 | Discharge: 2021-11-24 | Disposition: A | Discharge status: Home / Self Care | Payer: PPO | Source: Ambulatory Visit | Attending: Physician Assistant | Admitting: Physician Assistant

## 2021-11-24 DIAGNOSIS — R109 Unspecified abdominal pain: Secondary | ICD-10-CM

## 2021-11-24 MED ORDER — IOPAMIDOL (ISOVUE-370) INJECTION 76%
75.0000 mL | Freq: Once | INTRAVENOUS | Status: AC | PRN
  Administered 2021-11-24: 75 mL via INTRAVENOUS

## 2021-12-19 ENCOUNTER — Telehealth: Payer: Self-pay | Admitting: Podiatry

## 2021-12-19 NOTE — Telephone Encounter (Signed)
Pt calling to check status of her diabetic shoes. It has been 6 wks

## 2021-12-26 ENCOUNTER — Telehealth: Payer: Self-pay | Admitting: Podiatry

## 2021-12-26 NOTE — Telephone Encounter (Signed)
Spoke with pt and informed her that I have received her paperwork and will submit her order and call her when her products come in. Pt was appreciative of the call and will await appt call.

## 2022-01-09 ENCOUNTER — Other Ambulatory Visit: Payer: Self-pay | Admitting: Physician Assistant

## 2022-01-09 ENCOUNTER — Ambulatory Visit
Admission: RE | Admit: 2022-01-09 | Discharge: 2022-01-09 | Disposition: A | Discharge status: Home / Self Care | Payer: PPO | Source: Ambulatory Visit | Attending: Physician Assistant | Admitting: Physician Assistant

## 2022-01-09 DIAGNOSIS — R109 Unspecified abdominal pain: Secondary | ICD-10-CM

## 2022-01-12 ENCOUNTER — Ambulatory Visit (HOSPITAL_COMMUNITY)
Admission: RE | Admit: 2022-01-12 | Discharge: 2022-01-12 | Disposition: A | Discharge status: Home / Self Care | Payer: PPO | Source: Ambulatory Visit | Attending: Internal Medicine | Admitting: Internal Medicine

## 2022-01-12 ENCOUNTER — Encounter (HOSPITAL_COMMUNITY): Payer: Self-pay | Admitting: Internal Medicine

## 2022-01-12 VITALS — BP 110/62 | HR 85 | Wt 158.2 lb

## 2022-01-12 DIAGNOSIS — R262 Difficulty in walking, not elsewhere classified: Secondary | ICD-10-CM | POA: Diagnosis not present

## 2022-01-12 DIAGNOSIS — E1136 Type 2 diabetes mellitus with diabetic cataract: Secondary | ICD-10-CM | POA: Insufficient documentation

## 2022-01-12 DIAGNOSIS — Z720 Tobacco use: Secondary | ICD-10-CM

## 2022-01-12 DIAGNOSIS — R42 Dizziness and giddiness: Secondary | ICD-10-CM | POA: Insufficient documentation

## 2022-01-12 DIAGNOSIS — R0789 Other chest pain: Secondary | ICD-10-CM | POA: Insufficient documentation

## 2022-01-12 DIAGNOSIS — K219 Gastro-esophageal reflux disease without esophagitis: Secondary | ICD-10-CM | POA: Diagnosis not present

## 2022-01-12 DIAGNOSIS — F419 Anxiety disorder, unspecified: Secondary | ICD-10-CM | POA: Diagnosis not present

## 2022-01-12 DIAGNOSIS — I739 Peripheral vascular disease, unspecified: Secondary | ICD-10-CM

## 2022-01-12 DIAGNOSIS — Z8673 Personal history of transient ischemic attack (TIA), and cerebral infarction without residual deficits: Secondary | ICD-10-CM | POA: Diagnosis not present

## 2022-01-12 DIAGNOSIS — I272 Pulmonary hypertension, unspecified: Secondary | ICD-10-CM | POA: Insufficient documentation

## 2022-01-12 DIAGNOSIS — Z79899 Other long term (current) drug therapy: Secondary | ICD-10-CM | POA: Diagnosis not present

## 2022-01-12 DIAGNOSIS — Z85118 Personal history of other malignant neoplasm of bronchus and lung: Secondary | ICD-10-CM | POA: Insufficient documentation

## 2022-01-12 DIAGNOSIS — J9611 Chronic respiratory failure with hypoxia: Secondary | ICD-10-CM | POA: Insufficient documentation

## 2022-01-12 DIAGNOSIS — E1151 Type 2 diabetes mellitus with diabetic peripheral angiopathy without gangrene: Secondary | ICD-10-CM | POA: Diagnosis not present

## 2022-01-12 DIAGNOSIS — Z9981 Dependence on supplemental oxygen: Secondary | ICD-10-CM | POA: Insufficient documentation

## 2022-01-12 DIAGNOSIS — I110 Hypertensive heart disease with heart failure: Secondary | ICD-10-CM | POA: Insufficient documentation

## 2022-01-12 DIAGNOSIS — Z902 Acquired absence of lung [part of]: Secondary | ICD-10-CM | POA: Insufficient documentation

## 2022-01-12 DIAGNOSIS — I5032 Chronic diastolic (congestive) heart failure: Secondary | ICD-10-CM | POA: Diagnosis not present

## 2022-01-12 DIAGNOSIS — J449 Chronic obstructive pulmonary disease, unspecified: Secondary | ICD-10-CM | POA: Insufficient documentation

## 2022-01-12 NOTE — Patient Instructions (Signed)
There has been no changes to your medications.  Your physician recommends that you schedule a follow-up appointment in: 1 year (September 2024)  ** please call the office in June 2024 to arrange your follow up appointment **   If you have any questions or concerns before your next appointment please send [place] a message through [place] or call our office at [phone].    TO LEAVE A MESSAGE FOR THE NURSE SELECT OPTION 2, PLEASE LEAVE A MESSAGE INCLUDING: YOUR NAME DATE OF BIRTH CALL BACK NUMBER REASON FOR CALL**this is important as we prioritize the call backs  YOU WILL RECEIVE A CALL BACK THE SAME DAY AS LONG AS YOU CALL BEFORE 4:00 PM  At the [facility], you and your health needs are our priority. As part of our continuing mission to provide you with exceptional heart care, we have created designated Provider Care Teams. These Care Teams include your primary Cardiologist (physician) and Advanced Practice Providers (APPs- Physician Assistants and Nurse Practitioners) who all work together to provide you with the care you need, when you need it.   You may see any of the following providers on your designated Care Team at your next follow up: Dr Glori Bickers Dr Loralie Champagne Dr. Roxana Hires, NP [place] Jester, [place] [facility] [place], [place] Forestine Na, NP Audry Riles, PharmD   Please be sure to bring in all your medications bottles to every appointment.

## 2022-01-12 NOTE — Progress Notes (Incomplete)
ADVANCED HF CLINIC NOTE   Primary Cardiologist: Dr Gwenlyn Found Pulmonary: Dr Melvyn Novas  HF Cardiologist: Dr. Haroldine Laws  HPI Caitlin Oliver is an 81 y.o. with history of COPD, DM, PAD, LCEA 2015, R CEA 2014, GERD, hyperlipidemia, anxiety, HTN,chronic respiratory failure on 2 liters at home, lung cancer, S/P L upper lobectomy 2017, and chronic diastolic heart failure.   Admitted 06/16/18 with difficulty walking with ataxia. Code Stroke called. R precentral gyrus 5 mm ischemic stroke noted. Discharged on oxygen. Discharged on asa and plavix. She was later discharged to SNF for an extended stay.   Admitted to [facility] on 11/13/18  with increased shortness of breath and chest pain. Diuresed with IV lasix and transitioned to lasix 40 mg twice a day. Had RHC/LHC with mod pulmonary htn and elevated filling pressures.  S/p repair 06/19/19 with right carotid exposure with common carotid and innominate artery stenting as well as subclavian artery stenting via brachial approach by Dr. Carlis Abbott.  Follow up 7/22 feeling good, stable chronic NYHA III-IIIb symptoms. Mildly volume up and spiro started.    PCP follow up 03/16/21 for worsening fatigue. Flu neg, started on abx for sinus infection. Seen in ED 03/20/21 for SOB and chest tightness. CXR reassuring, low concern for HF or COPD exacerbation. Labs showed mild AKI 2.37.   Follow up 12/22, having more falls. Continued to refused pulmonary rehab. Stable NYHA III-IIIb symptoms, volume OK.  Admitted 1/23 with R fibula fracture after sustaining a fall. Briefly in ICU due to hypotension and susepcted septic shock. Echo showed EF 65-70%. Incidentally found to have acute CVA. Neuro consulted and rec short course of DAPT, then plavix as monotherapy. Underwent intramdeullary nail repair of fracture. Discharged to facility for rehab.  Last seen for f/u 03/23. Volume appeared elevated. Torsemide increased to 40 mg BID. Dyspnea felt to be multifactorial.  She is here today for f/u. Says she  was getting better. Has been riding exercise bike 40 mins per day. But this week has felt bad. Has had scratchy throat and coughing up phlegm. No fevers or chills. Feels weak.    Cardiac Studies: - Echo (1/23): EF 65 to 70%, grade 1 DD, severely elevated pulmonary artery systolic pressure, RVSP 18.5 mmHg  - Echo (7/20): EF 65%  Moderate Asymmetric hypertrophy septal wall. RV mild reduced. Small pericardial effusion.  - RHC (3/21): RA 9, PAP 50/15 (30), PCWP 17, PVR 2.5 WU, CO/CI 3.9/2.1   PFTs 11/20 FEV1 1.25 (62%) FVC 2.03 (75%) DLCO 51%  PFTs 8/17 FEV1 1.77 (84%) FVC 2.49 (88%) DLCO 69%  RHC/LHC  11/20/2018  RA 11 PAP 79/39 (52)  PCWP 31 PVR 4.9  CO 4.3  CI 2.9  Mild to moderate, non-obstructive coronary artery disease. Severe pulmonary hypertension. Moderately to severely elevated left and right heart filling pressures. Mildly reduced Fick cardiac output/index. Severe right brachiocephalic and subclavian artery stenoses with combined gradient of ~50 mmHg and possible reversal of flow in the right vertebral artery (subclavian steal).  Past Medical History:  Diagnosis Date   Anxiety    Arthritis    Cancer ([place])    lung cancer   Carotid artery disease (HCC)    Cataracts, bilateral    CHF (congestive heart failure) (HCC)    Claudication (HCC)    COPD (chronic obstructive pulmonary disease) ([place])    Diabetes mellitus    Dizzy    Family history of adverse reaction to anesthesia    Pt nephew has PONV   GERD (gastroesophageal reflux disease)  Headache    History of kidney stones    Hypercholesteremia    Hypertension    Peripheral arterial disease (HCC)    bilateral iliac artery stenosis by angiography   Pneumonia    Stomach ulcer    Stroke (HCC)    Tobacco abuse    Wears glasses    Current Outpatient Medications  Medication Sig Dispense Refill   acetaminophen (TYLENOL) 500 MG tablet Take 500 mg by mouth every 6 (six) hours as needed for moderate pain.      albuterol (PROVENTIL HFA;VENTOLIN HFA) 108 (90 Base) MCG/ACT inhaler Inhale 2 puffs into the lungs every 6 (six) hours as needed for wheezing or shortness of breath.      albuterol (PROVENTIL) (2.5 MG/3ML) 0.083% nebulizer solution Take 2.5 mg by nebulization in the morning and at bedtime.     Ascorbic Acid (VITAMIN C) 1000 MG tablet Take 1,000 mg by mouth daily.     atorvastatin (LIPITOR) 40 MG tablet Take 1 tablet (40 mg total) by mouth daily.     bisoprolol (ZEBETA) 5 MG tablet Take 0.5 tablets (2.5 mg total) by mouth daily. 45 tablet 3   carboxymethylcellul-glycerin (REFRESH OPTIVE) 0.5-0.9 % ophthalmic solution 1 drop as needed for dry eyes.     Cholecalciferol (VITAMIN D3 PO) Take 50 mcg by mouth daily. 1 capsules daily     clopidogrel (PLAVIX) 75 MG tablet Take 75 mg by mouth daily.     cyclobenzaprine (FLEXERIL) 5 MG tablet Take 5 mg by mouth as needed.     docusate sodium (COLACE) 100 MG capsule Take 100 mg by mouth 2 (two) times daily as needed for mild constipation.     escitalopram (LEXAPRO) 10 MG tablet Take 10 mg by mouth daily.     ferrous sulfate 325 (65 FE) MG tablet Take 325 mg by mouth daily with breakfast.     HYDROcodone-acetaminophen (NORCO/VICODIN) 5-325 MG tablet Take 1 tablet by mouth every 6 (six) hours as needed for moderate pain. 15 tablet 0   hydrOXYzine (ATARAX) 25 MG tablet Take 25 mg by mouth every 8 (eight) hours as needed for itching.     Magnesium 250 MG TABS Take 250 mg by mouth daily.     meclizine (ANTIVERT) 25 MG tablet Take 25 mg by mouth daily as needed.     Multiple Vitamin (MULTIVITAMIN WITH MINERALS) TABS tablet Take 1 tablet by mouth daily.     naphazoline-glycerin (CLEAR EYES REDNESS) 0.012-0.25 % SOLN 1-2 drops 4 (four) times daily as needed for eye irritation.     ondansetron (ZOFRAN) 4 MG tablet Take 4 mg by mouth daily as needed for nausea.     ONE TOUCH ULTRA TEST test strip      OXYGEN Inhale 2.5 L into the lungs.     pantoprazole (PROTONIX) 40  MG tablet Take 40 mg by mouth 2 (two) times daily.     polyethylene glycol (MIRALAX / GLYCOLAX) 17 g packet Take 17 g by mouth daily as needed for mild constipation.     potassium chloride SA (KLOR-CON M) 20 MEQ tablet Take 1 tablet (20 mEq total) by mouth daily. 60 tablet 3   torsemide (DEMADEX) 20 MG tablet Take 2 tablets (40 mg total) by mouth 2 (two) times daily. Take 2 tablet by mouth 2 times daily. 60 tablet 3   TRADJENTA 5 MG TABS tablet Take 5 mg by mouth daily.     umeclidinium bromide (INCRUSE ELLIPTA) 62.5 MCG/ACT AEPB Inhale 1 puff  into the lungs as needed.     No current facility-administered medications for this encounter.   Allergies  Allergen Reactions   Fentanyl Shortness Of Breath   Social History   Socioeconomic History   Marital status: Widowed    Spouse name: Not on file   Number of children: Not on file   Years of education: Not on file   Highest education level: Not on file  Occupational History   Not on file  Tobacco Use   Smoking status: Former    Packs/day: 1.50    Years: 56.00    Total pack years: 84.00    Types: Cigarettes    Quit date: 07/07/2013    Years since quitting: 8.5   Smokeless tobacco: Former    Quit date: 02/25/2013  Vaping Use   Vaping Use: Some days  Substance and Sexual Activity   Alcohol use: No   Drug use: No   Sexual activity: Never  Other Topics Concern   Not on file  Social History Narrative   Not on file   Social Determinants of Health   Financial Resource Strain: Not on file  Food Insecurity: Not on file  Transportation Needs: Not on file  Physical Activity: Not on file  Stress: Not on file  Social Connections: Not on file  Intimate Partner Violence: Not on file   Family History  Problem Relation Age of Onset   Stroke Mother    Hypertension Mother    Kidney failure Father    Lung disease Neg Hx    BP 110/62   Pulse 85   Wt 71.8 kg (158 lb 3.2 oz)   SpO2 93% Comment: patient is on 2.5 liters of oxygen.   BMI 27.15 kg/m   Wt Readings from Last 3 Encounters:  01/12/22 71.8 kg (158 lb 3.2 oz)  09/22/21 70.7 kg (155 lb 12.8 oz)  09/07/21 71.9 kg (158 lb 9.6 oz)   PHYSICAL EXAM: General: Chronically ill appearing elderly female. Ambulated into clinic with a walker Wearing O2 HEENT: normal Neck: supple. no JVD. Carotids 2+ bilat; no bruits. No lymphadenopathy or thryomegaly appreciated. Cor: PMI nondisplaced. Regular rate & rhythm. No rubs, gallops or murmurs. Lungs: clear Abdomen: soft, nontender, nondistended. No hepatosplenomegaly. No bruits or masses. Good bowel sounds. Extremities: no cyanosis, clubbing, rash, edema Neuro: alert & orientedx3, cranial nerves grossly intact. moves all 4 extremities w/o difficulty. Affect pleasant    ASSESSMENT & PLAN:  1. Cough/viral syndrome - I worry 1. Pulmonary HTN, Mixed presentation.  - she has mixed PAH/PVH. RHC (8/20)  RA 11,PAP 79/39 (52) ,PCWP 31, PVR 4.8, CO 4.3 , CI 2.9 - RHC (3/21): RA 9, PAP 50/15 (30), PCWP 17, PVR 2.5 WU, CO/CI 3.9/2.1 - suspect combination of WHO Group II and III  - PFTs with obstructive/restrictive picture - Refused sleep study. - Not candidate for selective pulmonary artery vasodilators. - Continue wearing O2.   2. Chronic diastolic HF - Echo 05/27/92 EF 65% - Chronic NYHA III. Volume status ok  - Continue torsemide 40 bid - Off spiro with elevated SCr.  - Labs today  3. Chronic Hypoxic Respiratory Failure due to COPD and H/O Lung Cancer S/P LUL lobectomy  - On home oxygen. Continue. - Follow up with Pulmonary.   4. Suspected Sleep Apnea  - She has refused sleep study.  5. Chronic dizziness - Due to previous CVA +/- posterior circulation insufficiency. - High fall risk.  6. Severe PAD  - Hx bilateral CEA  by Dr. Trula Slade - Hx right innominate and common carotid stenting by Dr. Carlis Abbott in 2020 - s/p redo right common carotid artery exposure and retrograde stenting of her innominate and proximal common  high-grade stenosis with kissing stent into the right subclavian 03/21 by Dr. Carlis Abbott.  - followed by [facility]    Glori Bickers MD 12:26 PM

## 2022-01-15 ENCOUNTER — Other Ambulatory Visit (HOSPITAL_COMMUNITY): Payer: Self-pay

## 2022-01-15 DIAGNOSIS — I5032 Chronic diastolic (congestive) heart failure: Secondary | ICD-10-CM

## 2022-01-15 MED ORDER — TORSEMIDE 20 MG PO TABS: 40.0000 mg | ORAL_TABLET | Freq: Two times a day (BID) | ORAL | 4 refills | Status: DC

## 2022-01-31 ENCOUNTER — Other Ambulatory Visit: Payer: Self-pay

## 2022-01-31 ENCOUNTER — Emergency Department (HOSPITAL_COMMUNITY): Payer: PPO

## 2022-01-31 ENCOUNTER — Encounter (HOSPITAL_COMMUNITY): Payer: Self-pay

## 2022-01-31 ENCOUNTER — Inpatient Hospital Stay (HOSPITAL_COMMUNITY)
Admission: EM | Admit: 2022-01-31 | Discharge: 2022-02-03 | DRG: 190 | Disposition: A | Discharge status: Home Health | Payer: PPO | Attending: Family Medicine | Admitting: Family Medicine

## 2022-01-31 DIAGNOSIS — E1151 Type 2 diabetes mellitus with diabetic peripheral angiopathy without gangrene: Secondary | ICD-10-CM | POA: Diagnosis present

## 2022-01-31 DIAGNOSIS — Z7902 Long term (current) use of antithrombotics/antiplatelets: Secondary | ICD-10-CM

## 2022-01-31 DIAGNOSIS — R0602 Shortness of breath: Secondary | ICD-10-CM | POA: Diagnosis not present

## 2022-01-31 DIAGNOSIS — R0600 Dyspnea, unspecified: Principal | ICD-10-CM

## 2022-01-31 DIAGNOSIS — J9811 Atelectasis: Secondary | ICD-10-CM | POA: Diagnosis present

## 2022-01-31 DIAGNOSIS — J42 Unspecified chronic bronchitis: Secondary | ICD-10-CM

## 2022-01-31 DIAGNOSIS — Z8673 Personal history of transient ischemic attack (TIA), and cerebral infarction without residual deficits: Secondary | ICD-10-CM

## 2022-01-31 DIAGNOSIS — Z885 Allergy status to narcotic agent status: Secondary | ICD-10-CM

## 2022-01-31 DIAGNOSIS — R079 Chest pain, unspecified: Secondary | ICD-10-CM

## 2022-01-31 DIAGNOSIS — R252 Cramp and spasm: Secondary | ICD-10-CM | POA: Diagnosis present

## 2022-01-31 DIAGNOSIS — Z79899 Other long term (current) drug therapy: Secondary | ICD-10-CM

## 2022-01-31 DIAGNOSIS — Z66 Do not resuscitate: Secondary | ICD-10-CM | POA: Diagnosis present

## 2022-01-31 DIAGNOSIS — K5900 Constipation, unspecified: Secondary | ICD-10-CM | POA: Diagnosis present

## 2022-01-31 DIAGNOSIS — I130 Hypertensive heart and chronic kidney disease with heart failure and stage 1 through stage 4 chronic kidney disease, or unspecified chronic kidney disease: Secondary | ICD-10-CM | POA: Diagnosis present

## 2022-01-31 DIAGNOSIS — E7800 Pure hypercholesterolemia, unspecified: Secondary | ICD-10-CM | POA: Diagnosis present

## 2022-01-31 DIAGNOSIS — I5032 Chronic diastolic (congestive) heart failure: Secondary | ICD-10-CM | POA: Diagnosis present

## 2022-01-31 DIAGNOSIS — I10 Essential (primary) hypertension: Secondary | ICD-10-CM | POA: Diagnosis present

## 2022-01-31 DIAGNOSIS — J9621 Acute and chronic respiratory failure with hypoxia: Secondary | ICD-10-CM

## 2022-01-31 DIAGNOSIS — I739 Peripheral vascular disease, unspecified: Secondary | ICD-10-CM

## 2022-01-31 DIAGNOSIS — J209 Acute bronchitis, unspecified: Secondary | ICD-10-CM

## 2022-01-31 DIAGNOSIS — J441 Chronic obstructive pulmonary disease with (acute) exacerbation: Principal | ICD-10-CM | POA: Diagnosis present

## 2022-01-31 DIAGNOSIS — E1159 Type 2 diabetes mellitus with other circulatory complications: Secondary | ICD-10-CM | POA: Diagnosis present

## 2022-01-31 DIAGNOSIS — Z87891 Personal history of nicotine dependence: Secondary | ICD-10-CM

## 2022-01-31 DIAGNOSIS — F419 Anxiety disorder, unspecified: Secondary | ICD-10-CM | POA: Diagnosis present

## 2022-01-31 DIAGNOSIS — E1122 Type 2 diabetes mellitus with diabetic chronic kidney disease: Secondary | ICD-10-CM | POA: Diagnosis present

## 2022-01-31 DIAGNOSIS — G43909 Migraine, unspecified, not intractable, without status migrainosus: Secondary | ICD-10-CM | POA: Diagnosis present

## 2022-01-31 DIAGNOSIS — R519 Headache, unspecified: Secondary | ICD-10-CM

## 2022-01-31 DIAGNOSIS — Z823 Family history of stroke: Secondary | ICD-10-CM

## 2022-01-31 DIAGNOSIS — Z902 Acquired absence of lung [part of]: Secondary | ICD-10-CM

## 2022-01-31 DIAGNOSIS — Z20822 Contact with and (suspected) exposure to covid-19: Secondary | ICD-10-CM | POA: Diagnosis present

## 2022-01-31 DIAGNOSIS — K219 Gastro-esophageal reflux disease without esophagitis: Secondary | ICD-10-CM | POA: Diagnosis present

## 2022-01-31 DIAGNOSIS — Z85118 Personal history of other malignant neoplasm of bronchus and lung: Secondary | ICD-10-CM

## 2022-01-31 DIAGNOSIS — R109 Unspecified abdominal pain: Secondary | ICD-10-CM

## 2022-01-31 DIAGNOSIS — I2510 Atherosclerotic heart disease of native coronary artery without angina pectoris: Secondary | ICD-10-CM | POA: Diagnosis present

## 2022-01-31 DIAGNOSIS — N1832 Chronic kidney disease, stage 3b: Secondary | ICD-10-CM | POA: Diagnosis present

## 2022-01-31 DIAGNOSIS — N179 Acute kidney failure, unspecified: Secondary | ICD-10-CM | POA: Diagnosis not present

## 2022-01-31 DIAGNOSIS — Z8249 Family history of ischemic heart disease and other diseases of the circulatory system: Secondary | ICD-10-CM

## 2022-01-31 DIAGNOSIS — Z9981 Dependence on supplemental oxygen: Secondary | ICD-10-CM

## 2022-01-31 DIAGNOSIS — I272 Pulmonary hypertension, unspecified: Secondary | ICD-10-CM | POA: Diagnosis present

## 2022-01-31 LAB — CBC WITH DIFFERENTIAL/PLATELET
Abs Immature Granulocytes: 0.12 10*3/uL — ABNORMAL HIGH (ref 0.00–0.07)
Basophils Absolute: 0.1 10*3/uL (ref 0.0–0.1)
Basophils Relative: 1 %
Eosinophils Absolute: 0.7 10*3/uL — ABNORMAL HIGH (ref 0.0–0.5)
Eosinophils Relative: 5 %
HCT: 37.2 % (ref 36.0–46.0)
Hemoglobin: 12.1 g/dL (ref 12.0–15.0)
Immature Granulocytes: 1 %
Lymphocytes Relative: 5 %
Lymphs Abs: 0.7 10*3/uL (ref 0.7–4.0)
MCH: 29.1 pg (ref 26.0–34.0)
MCHC: 32.5 g/dL (ref 30.0–36.0)
MCV: 89.4 fL (ref 80.0–100.0)
Monocytes Absolute: 1 10*3/uL (ref 0.1–1.0)
Monocytes Relative: 7 %
Neutro Abs: 11.4 10*3/uL — ABNORMAL HIGH (ref 1.7–7.7)
Neutrophils Relative %: 81 %
Platelets: 389 10*3/uL (ref 150–400)
RBC: 4.16 MIL/uL (ref 3.87–5.11)
RDW: 11.9 % (ref 11.5–15.5)
WBC: 13.9 10*3/uL — ABNORMAL HIGH (ref 4.0–10.5)
nRBC: 0 % (ref 0.0–0.2)

## 2022-01-31 LAB — BASIC METABOLIC PANEL
Anion gap: 11 (ref 5–15)
BUN: 34 mg/dL — ABNORMAL HIGH (ref 8–23)
CO2: 28 mmol/L (ref 22–32)
Calcium: 9.7 mg/dL (ref 8.9–10.3)
Chloride: 99 mmol/L (ref 98–111)
Creatinine, Ser: 1.85 mg/dL — ABNORMAL HIGH (ref 0.44–1.00)
GFR, Estimated: 27 mL/min — ABNORMAL LOW (ref >60)
Glucose, Bld: 104 mg/dL — ABNORMAL HIGH (ref 70–99)
Potassium: 4 mmol/L (ref 3.5–5.1)
Sodium: 138 mmol/L (ref 135–145)

## 2022-01-31 LAB — BRAIN NATRIURETIC PEPTIDE: B Natriuretic Peptide: 61.6 pg/mL (ref 0.0–100.0)

## 2022-01-31 LAB — D-DIMER, QUANTITATIVE: D-Dimer, Quant: 1.09 ug/mL-FEU — ABNORMAL HIGH (ref 0.00–0.50)

## 2022-01-31 LAB — TROPONIN I (HIGH SENSITIVITY)
Troponin I (High Sensitivity): 11 ng/L (ref <18)
Troponin I (High Sensitivity): 9 ng/L (ref <18)

## 2022-01-31 MED ORDER — BISOPROLOL FUMARATE 5 MG PO TABS
2.5000 mg | ORAL_TABLET | Freq: Every day | ORAL | Status: DC
  Administered 2022-02-01 – 2022-02-03 (×3): 2.5 mg via ORAL
  Filled 2022-01-31: qty 0.5
  Filled 2022-01-31 (×2): qty 1

## 2022-01-31 MED ORDER — ACETAMINOPHEN 500 MG PO TABS
1000.0000 mg | ORAL_TABLET | ORAL | Status: AC
  Administered 2022-01-31: 1000 mg via ORAL
  Filled 2022-01-31: qty 2

## 2022-01-31 MED ORDER — ASPIRIN 325 MG PO TABS
325.0000 mg | ORAL_TABLET | Freq: Every day | ORAL | Status: DC
  Administered 2022-01-31: 325 mg via ORAL
  Filled 2022-01-31: qty 1

## 2022-01-31 MED ORDER — PANTOPRAZOLE SODIUM 40 MG PO TBEC
40.0000 mg | DELAYED_RELEASE_TABLET | Freq: Two times a day (BID) | ORAL | Status: DC
  Administered 2022-02-01 – 2022-02-03 (×5): 40 mg via ORAL
  Filled 2022-01-31 (×5): qty 1

## 2022-01-31 MED ORDER — CLOPIDOGREL BISULFATE 75 MG PO TABS
75.0000 mg | ORAL_TABLET | Freq: Every day | ORAL | Status: DC
  Administered 2022-02-01 – 2022-02-03 (×3): 75 mg via ORAL
  Filled 2022-01-31 (×3): qty 1

## 2022-01-31 MED ORDER — ACETAMINOPHEN 325 MG PO TABS
650.0000 mg | ORAL_TABLET | Freq: Four times a day (QID) | ORAL | Status: DC | PRN
  Administered 2022-02-01 – 2022-02-03 (×5): 650 mg via ORAL
  Filled 2022-01-31 (×5): qty 2

## 2022-01-31 MED ORDER — TORSEMIDE 20 MG PO TABS
40.0000 mg | ORAL_TABLET | Freq: Two times a day (BID) | ORAL | Status: DC
  Administered 2022-02-01 (×2): 40 mg via ORAL
  Filled 2022-01-31 (×2): qty 2

## 2022-01-31 MED ORDER — ENOXAPARIN SODIUM 30 MG/0.3ML IJ SOSY
30.0000 mg | PREFILLED_SYRINGE | INTRAMUSCULAR | Status: DC
  Administered 2022-02-01 – 2022-02-03 (×3): 30 mg via SUBCUTANEOUS
  Filled 2022-01-31 (×3): qty 0.3

## 2022-01-31 MED ORDER — ACETAMINOPHEN 650 MG RE SUPP: 650.0000 mg | Freq: Four times a day (QID) | RECTAL | Status: DC | PRN

## 2022-01-31 MED ORDER — CYCLOBENZAPRINE HCL 5 MG PO TABS
5.0000 mg | ORAL_TABLET | Freq: Every evening | ORAL | Status: DC | PRN
  Administered 2022-02-01 (×2): 5 mg via ORAL
  Filled 2022-01-31 (×2): qty 1

## 2022-01-31 MED ORDER — ALBUTEROL SULFATE (2.5 MG/3ML) 0.083% IN NEBU
2.5000 mg | INHALATION_SOLUTION | Freq: Four times a day (QID) | RESPIRATORY_TRACT | Status: DC | PRN
  Administered 2022-02-01: 2.5 mg via RESPIRATORY_TRACT
  Filled 2022-01-31: qty 3

## 2022-01-31 MED ORDER — NITROGLYCERIN 0.4 MG SL SUBL
0.4000 mg | SUBLINGUAL_TABLET | SUBLINGUAL | Status: DC | PRN
  Filled 2022-01-31: qty 1

## 2022-01-31 MED ORDER — MAGNESIUM 250 MG PO TABS: 250.0000 mg | ORAL_TABLET | Freq: Every day | ORAL | Status: DC

## 2022-01-31 MED ORDER — UMECLIDINIUM BROMIDE 62.5 MCG/ACT IN AEPB
1.0000 | INHALATION_SPRAY | Freq: Every day | RESPIRATORY_TRACT | Status: DC
  Administered 2022-02-01: 1 via RESPIRATORY_TRACT
  Filled 2022-01-31: qty 7

## 2022-01-31 MED ORDER — ESCITALOPRAM OXALATE 10 MG PO TABS
10.0000 mg | ORAL_TABLET | Freq: Every day | ORAL | Status: DC
  Administered 2022-02-01 – 2022-02-03 (×3): 10 mg via ORAL
  Filled 2022-01-31 (×3): qty 1

## 2022-01-31 MED ORDER — LINAGLIPTIN 5 MG PO TABS
5.0000 mg | ORAL_TABLET | Freq: Every day | ORAL | Status: DC
  Administered 2022-02-01 – 2022-02-03 (×3): 5 mg via ORAL
  Filled 2022-01-31 (×3): qty 1

## 2022-01-31 MED ORDER — ATORVASTATIN CALCIUM 40 MG PO TABS
40.0000 mg | ORAL_TABLET | Freq: Every day | ORAL | Status: DC
  Administered 2022-02-01 – 2022-02-03 (×3): 40 mg via ORAL
  Filled 2022-01-31 (×3): qty 1

## 2022-01-31 NOTE — Assessment & Plan Note (Addendum)
Acute on chronic hypoxemic respiratory failure.  Thought to be related to parenchymal lung disease versus COPD versus less likely pneumonia.  S/p left upper lobe lobectomy for lung cancer.  Patient also with a history of chronic diastolic heart failure and pulmonary hypertension.  Euvolemic on exam.   -Appreciate cardiology recs -Continue cardiac monitoring, pulse ox -As needed albuterol nebulizer -Echocardiogram - Supplemental O2 to maintain sats 88-92%. Goal 2L via [place], wean as tolerated - PT/OT - Optimize inhaler regimen prior to discharge

## 2022-01-31 NOTE — Assessment & Plan Note (Addendum)
Patient presented with soft blood pressures but have since normalized.  Home medications include bisprolol and torsemide. Cardiology recommended continuation of home medcations.  - Vitals per routine - Continue home bisprolol - Torsemide 48m daily instead of BID given elevated Cr - Monitor BP closely  - Consider orthostatic vitals

## 2022-01-31 NOTE — Consult Note (Signed)
Cardiology Consultation   Patient ID: JESSIECA RHEM MRN: [id number]; DOB: [birth year]  Admit date: 01/31/2022 Date of Consult: 01/31/2022  PCP:  [name], PA-C   [facility] Providers Cardiologist:  Quay Burow, MD        Patient Profile:   Caitlin Oliver is a 81 y.o. female with a hx of  COPD, DM, PAD, LCEA 2015, R CEA 2014, GERD, hyperlipidemia, anxiety, HTN,chronic respiratory failure on 2 liters at home, lung cancer, S/P L upper lobectomy 2017, and chronic diastolic heart failure. who is being seen 01/31/2022 for the evaluation of exertional chest pain at the request of internal medicine.  History of Present Illness:   Caitlin Oliver is 81 yo F with above PMH who presents for exertional chest pain and shortness of breath.   She states for the last couple of weeks she has had increased shortness of breath and chest pain.  She notes that 4 weeks ago she has began having increased cough, increased sputum production that was yellow and thick (normal lites white).  Since that time she is noted chest pain when she takes a deep breath in that radiates to her back and worsens with movement.  She is prescribed 3 L nasal cannula at home and has been using 4 at rest and with exertion since a month ago.  She has been compliant with her home medications including her home torsemide.  She weighs herself daily and her weight has been stable around 152-1 53.  She does not feel overloaded at this time.  She denies any fevers, chills, productive sputum at this time.  Work-up to date is notable for an EKG without ischemic changes.  Troponin 11.  BNP normal.  Creatinine 1.8 (near baseline).  She does have a mild leukocytosis at 13.  She is status post aspirin 325 in the ED.  Her last ischemic work-up was in 2020 that showed very mild nonobstructive coronary artery disease.   Past Medical History:  Diagnosis Date   Anxiety    Arthritis    Cancer ([place])    lung cancer   Carotid  artery disease (HCC)    Cataracts, bilateral    CHF (congestive heart failure) (HCC)    Claudication (HCC)    COPD (chronic obstructive pulmonary disease) ([place])    Diabetes mellitus    Dizzy    Family history of adverse reaction to anesthesia    Pt nephew has PONV   GERD (gastroesophageal reflux disease)    Headache    History of kidney stones    Hypercholesteremia    Hypertension    Peripheral arterial disease (HCC)    bilateral iliac artery stenosis by angiography   Pneumonia    Stomach ulcer    Stroke ([place])    Tobacco abuse    Wears glasses    Past Surgical History:  Procedure Laterality Date   ABDOMINAL AORTIC ENDOVASCULAR STENT GRAFT Right 06/19/2019   Procedure: INNOMINATE STENT, RIGHT CAROTID STENT, RIGHT SUBCLAVIAN STENT AND CAROTID CUTDOWN;  Surgeon: Marty Heck, MD;  Location: [place];  Service: Vascular;  Laterality: Right;   ABDOMINAL HYSTERECTOMY     APPENDECTOMY     BREAST REDUCTION SURGERY     CAROTID ANGIOGRAM N/A 02/25/2013   Procedure: CAROTID ANGIOGRAM;  Surgeon: Lorretta Harp, MD;  Location: [facility] CATH LAB;  Service: Cardiovascular;  Laterality: N/A;   ENDARTERECTOMY Right 03/06/2013   Procedure: ENDARTERECTOMY CAROTID-RIGHT;  Surgeon: Serafina Mitchell, MD;  Location: [facility]  OR;  Service: Vascular;  Laterality: Right;   ENDARTERECTOMY Left 05/07/2013   Procedure: LEFT CAROTID ARTERY ENDARTERECTOMY WITH VASCU-GUARD PATCH ANGIOPLASTY ;  Surgeon: Serafina Mitchell, MD;  Location: [place];  Service: Vascular;  Laterality: Left;   INTRAOPERATIVE ARTERIOGRAM N/A 06/19/2019   Procedure: Clydie Braun;  Surgeon: Marty Heck, MD;  Location: [place];  Service: Vascular;  Laterality: N/A;   IR ANGIO INTRA EXTRACRAN SEL COM CAROTID INNOMINATE UNI R MOD SED  05/14/2019   IR ANGIO VERTEBRAL SEL SUBCLAVIAN INNOMINATE BILAT MOD SED  05/14/2019   LOBECTOMY Left 02/03/2016   Procedure: LEFT UPPER LOBECTOMY;  Surgeon: [name], MD;  Location: [place];  Service:  Thoracic;  Laterality: Left;   LOWER EXTREMITY ANGIOGRAM N/A 02/25/2013   Procedure: LOWER EXTREMITY ANGIOGRAM;  Surgeon: Lorretta Harp, MD;  Location: [facility] CATH LAB;  Service: Cardiovascular;  Laterality: N/A;   LOWER EXTREMITY ANGIOGRAM N/A 07/27/2013   Procedure: LOWER EXTREMITY ANGIOGRAM;  Surgeon: Lorretta Harp, MD;  Location: [facility] CATH LAB;  Service: Cardiovascular;  Laterality: N/A;   PATCH ANGIOPLASTY Right 03/06/2013   Procedure: PATCH ANGIOPLASTY of Right Carotid Artery using Vascu-Guard Patch;  Surgeon: Serafina Mitchell, MD;  Location: [place];  Service: Vascular;  Laterality: Right;   PERIPHERAL VASCULAR CATHETERIZATION N/A 11/15/2015   Procedure: Aortic Arch Angiography;  Surgeon: Serafina Mitchell, MD;  Location: [place] CV LAB;  Service: Cardiovascular;  Laterality: N/A;   PERIPHERAL VASCULAR CATHETERIZATION Bilateral 11/15/2015   Procedure: Carotid Angiography;  Surgeon: Serafina Mitchell, MD;  Location: [place] CV LAB;  Service: Cardiovascular;  Laterality: Bilateral;   PERIPHERAL VASCULAR CATHETERIZATION Left 11/15/2015   Procedure: Upper Extremity Angiography;  Surgeon: Serafina Mitchell, MD;  Location: [place] CV LAB;  Service: Cardiovascular;  Laterality: Left;   PERIPHERAL VASCULAR CATHETERIZATION Left 11/15/2015   Procedure: Peripheral Vascular Intervention;  Surgeon: Serafina Mitchell, MD;  Location: [place] CV LAB;  Service: Cardiovascular;  Laterality: Left;  subclavian    RIGHT HEART CATH N/A 06/16/2019   Procedure: RIGHT HEART CATH;  Surgeon: Jolaine Artist, MD;  Location: [place] CV LAB;  Service: Cardiovascular;  Laterality: N/A;   RIGHT/LEFT HEART CATH AND CORONARY ANGIOGRAPHY N/A 11/20/2018   Procedure: RIGHT/LEFT HEART CATH AND CORONARY ANGIOGRAPHY;  Surgeon: Nelva Bush, MD;  Location: [place] CV LAB;  Service: Cardiovascular;  Laterality: N/A;   SALIVARY GLAND SURGERY     scar tissue removed from left saliva glad   TIBIA IM NAIL INSERTION Right  05/23/2021   Procedure: INTRAMEDULLARY (IM) NAIL TIBIAL;  Surgeon: Altamese Morgan, MD;  Location: [place];  Service: Orthopedics;  Laterality: Right;   TUBAL LIGATION     ULTRASOUND GUIDANCE FOR VASCULAR ACCESS Right 06/19/2019   Procedure: Ultrasound Guidance For Vascular Access;  Surgeon: Marty Heck, MD;  Location: [place];  Service: Vascular;  Laterality: Right;   VIDEO ASSISTED THORACOSCOPY (VATS)/WEDGE RESECTION Left 02/03/2016   Procedure: VIDEO ASSISTED THORACOSCOPY;  Surgeon: [name], MD;  Location: [place];  Service: Thoracic;  Laterality: Left;    Home Medications:  Prior to Admission medications   Medication Sig Start Date End Date Taking? Authorizing Provider  acetaminophen (TYLENOL) 500 MG tablet Take 500 mg by mouth every 6 (six) hours as needed for moderate pain.    [provider]  albuterol (PROVENTIL HFA;VENTOLIN HFA) 108 (90 Base) MCG/ACT inhaler Inhale 2 puffs into the lungs every 6 (six) hours as needed for wheezing or  shortness of breath.  07/11/17   [provider]  albuterol (PROVENTIL) (2.5 MG/3ML) 0.083% nebulizer solution Take 2.5 mg by nebulization in the morning and at bedtime.    [provider]  Ascorbic Acid (VITAMIN C) 1000 MG tablet Take 1,000 mg by mouth daily.    [provider]  atorvastatin (LIPITOR) 40 MG tablet Take 1 tablet (40 mg total) by mouth daily. 05/27/21   Gerrit Heck, MD  bisoprolol (ZEBETA) 5 MG tablet Take 0.5 tablets (2.5 mg total) by mouth daily. 09/22/21   Joette Catching, PA-C  carboxymethylcellul-glycerin (REFRESH OPTIVE) 0.5-0.9 % ophthalmic solution 1 drop as needed for dry eyes.    [provider]  Cholecalciferol (VITAMIN D3 PO) Take 50 mcg by mouth daily. 1 capsules daily    [provider]  clopidogrel (PLAVIX) 75 MG tablet Take 75 mg by mouth daily. 09/05/21   [provider]  cyclobenzaprine (FLEXERIL) 5 MG tablet Take 5 mg by mouth as needed. 09/05/21    [provider]  docusate sodium (COLACE) 100 MG capsule Take 100 mg by mouth 2 (two) times daily as needed for mild constipation.    [provider]  escitalopram (LEXAPRO) 10 MG tablet Take 10 mg by mouth daily.    [provider]  ferrous sulfate 325 (65 FE) MG tablet Take 325 mg by mouth daily with breakfast.    [provider]  HYDROcodone-acetaminophen (NORCO/VICODIN) 5-325 MG tablet Take 1 tablet by mouth every 6 (six) hours as needed for moderate pain. 06/21/19   Dagoberto Ligas, PA-C  hydrOXYzine (ATARAX) 25 MG tablet Take 25 mg by mouth every 8 (eight) hours as needed for itching. 09/05/21   [provider]  Magnesium 250 MG TABS Take 250 mg by mouth daily.    [provider]  meclizine (ANTIVERT) 25 MG tablet Take 25 mg by mouth daily as needed. 06/14/21   [provider]  Multiple Vitamin (MULTIVITAMIN WITH MINERALS) TABS tablet Take 1 tablet by mouth daily. 05/27/21   Gerrit Heck, MD  naphazoline-glycerin (CLEAR EYES REDNESS) 0.012-0.25 % SOLN 1-2 drops 4 (four) times daily as needed for eye irritation.    [provider]  ondansetron (ZOFRAN) 4 MG tablet Take 4 mg by mouth daily as needed for nausea.    [provider]  ONE TOUCH ULTRA TEST test strip  09/12/16   [provider]  OXYGEN Inhale 2.5 L into the lungs.    [provider]  pantoprazole (PROTONIX) 40 MG tablet Take 40 mg by mouth 2 (two) times daily. 12/17/13   [provider]  polyethylene glycol (MIRALAX / GLYCOLAX) 17 g packet Take 17 g by mouth daily as needed for mild constipation. 12/02/18   [provider]  potassium chloride SA (KLOR-CON M) 20 MEQ tablet Take 1 tablet (20 mEq total) by mouth daily. 09/22/21   Joette Catching, PA-C  torsemide (DEMADEX) 20 MG tablet Take 2 tablets (40 mg total) by mouth 2 (two) times daily. Take 2 tablet by mouth 2 times daily. 01/15/22   Joette Catching, PA-C   TRADJENTA 5 MG TABS tablet Take 5 mg by mouth daily. 09/05/21   [provider]  umeclidinium bromide (INCRUSE ELLIPTA) 62.5 MCG/ACT AEPB Inhale 1 puff into the lungs as needed. 09/05/21   [provider]   Inpatient Medications: Scheduled Meds:  aspirin  325 mg Oral Daily   Continuous Infusions:  PRN Meds:  Allergies:    Allergies  Allergen Reactions   Fentanyl Shortness Of Breath    Social History:   Social History   Socioeconomic History   Marital status: Widowed    Spouse name: Not on file   Number of children: Not on file   Years of education: Not on file   Highest education level: Not on file  Occupational History   Not on file  Tobacco Use   Smoking status: Former    Packs/day: 1.50    Years: 56.00    Total pack years: 84.00    Types: Cigarettes    Quit date: 07/07/2013    Years since quitting: 8.5   Smokeless tobacco: Former    Quit date: 02/25/2013  Vaping Use   Vaping Use: Some days  Substance and Sexual Activity   Alcohol use: No   Drug use: No   Sexual activity: Never  Other Topics Concern   Not on file  Social History Narrative   Not on file   Social Determinants of Health   Financial Resource Strain: Not on file  Food Insecurity: Not on file  Transportation Needs: Not on file  Physical Activity: Not on file  Stress: Not on file  Social Connections: Not on file  Intimate Partner Violence: Not on file    Family History:    Family History  Problem Relation Age of Onset   Stroke Mother    Hypertension Mother    Kidney failure Father    Lung disease Neg Hx      ROS:  Please see the history of present illness.   All other ROS reviewed and negative.     Physical Exam/Data:   Vitals:   01/31/22 1815 01/31/22 1830 01/31/22 1915 01/31/22 1945  BP: 110/61 (!) 102/59 101/62 101/64  Pulse: 69 71 (!) 59 69  Resp:  _0 Temp:  98.1 F (36.7 C)    TempSrc:  Oral    SpO2: 100% 100% 100% 97%  Weight:      Height:        No intake or output data in the 24 hours ending 01/31/22 2010    01/31/2022   12:03 PM 01/12/2022   12:07 PM 09/22/2021   10:18 AM  Last 3 Weights  Weight (lbs) 158 lb 158 lb 3.2 oz 155 lb 12.8 oz  Weight (kg) 71.668 kg 71.759 kg 70.67 kg     Body mass index is 27.12 kg/m.  General:  Well nourished, well developed, in no acute distress HEENT: normal Neck: no JVD Vascular: No carotid bruits; Distal pulses 2+ bilaterally Cardiac:  normal S1, S2; RRR; no murmur  Lungs: Mild coarse breath sounds, no wheezing, no increased work of breathing, tenderness to palpation of chest wall Abd: soft, nontender, no hepatomegaly  Ext: no edema Musculoskeletal:  No deformities, BUE and BLE strength normal and equal Skin: warm and dry  Neuro:  CNs 2-12 intact, no focal abnormalities noted Psych:  Normal affect   EKG:  The EKG was personally reviewed and demonstrates: Normal sinus rhythm, new T wave inversion in lead III but no contiguous acute ischemic changes Telemetry:  Telemetry was personally reviewed and demonstrates: Normal sinus rhythm  Relevant CV Studies:  - Echo (1/23): EF 65 to 70%, grade 1 DD, severely elevated pulmonary artery systolic pressure, RVSP 93.7 mmHg  - Echo (7/20): EF 65%  Moderate Asymmetric hypertrophy septal wall. RV mild reduced. Small pericardial effusion.   - RHC (3/21): RA 9, PAP 50/15 (30), PCWP 17, PVR  2.5 WU, CO/CI 3.9/2.1    PFTs 11/20 FEV1 1.25 (62%) FVC 2.03 (75%) DLCO 51%   PFTs 8/17 FEV1 1.77 (84%) FVC 2.49 (88%) DLCO 69%   RHC/LHC  11/20/2018  RA 11 PAP 79/39 (52)  PCWP 31 PVR 4.9  CO 4.3  CI 2.9  Mild to moderate, non-obstructive coronary artery disease. Severe pulmonary hypertension. Moderately to severely elevated left and right heart filling pressures. Mildly reduced Fick cardiac output/index. Severe right brachiocephalic and subclavian artery stenoses with combined gradient of ~50 mmHg and possible reversal of flow in the right  vertebral artery (subclavian steal).  Laboratory Data:  High Sensitivity Troponin:   Recent Labs  Lab 01/31/22 1750  TROPONINIHS 11     Chemistry Recent Labs  Lab 01/31/22 1233  NA 138  K 4.0  CL 99  CO2 28  GLUCOSE 104*  BUN 34*  CREATININE 1.85*  CALCIUM 9.7  GFRNONAA 27*  ANIONGAP 11    No results for input(s): "PROT", "ALBUMIN", "AST", "ALT", "ALKPHOS", "BILITOT" in the last 168 hours. Lipids No results for input(s): "CHOL", "TRIG", "HDL", "LABVLDL", "LDLCALC", "CHOLHDL" in the last 168 hours.  Hematology Recent Labs  Lab 01/31/22 1233  WBC 13.9*  RBC 4.16  HGB 12.1  HCT 37.2  MCV 89.4  MCH 29.1  MCHC 32.5  RDW 11.9  PLT 389   Thyroid No results for input(s): "TSH", "FREET4" in the last 168 hours.  BNP Recent Labs  Lab 01/31/22 1233  BNP 61.6    DDimer  Recent Labs  Lab 01/31/22 1750  DDIMER 1.09*   Radiology/Studies:  DG Chest 2 View  Result Date: 01/31/2022 CLINICAL DATA:  Shortness of breath for 3 weeks. EXAM: CHEST - 2 VIEW COMPARISON:  AP chest 05/25/2021, 05/16/2021, CT chest 06/18/2018 FINDINGS: Heart size is mildly enlarged. There are dense calcifications within the aortic arch. Mildly to moderately decreased lung volumes. Mild bilateral chronic interstitial thickening/scarring. Trace left pleural fluid. No pneumothorax. Moderate multilevel degenerative disc changes of the thoracic spine. IMPRESSION: Chronic interstitial thickening/scarring. Chronic small left pleural effusion, appearing unchanged from 05/25/2021 frontal radiograph and 06/19/2018 CT. Electronically Signed   By: Yvonne Kendall M.D.   On: 01/31/2022 13:00    Assessment and Plan:   Pleuritic and exertional chest pain Suspect patient had viral or COPD exacerbation 4 weeks ago that led to increased cough and inflammation of the chest wall.  Suspect her chest pain is costochondritis.  She clearly describes her chest pain as pleuritic, worse with movement.  Given her poor lung  reserve suspect that her shortness of breath is related more to her parenchymal lung disease.  Given no EKG findings of ischemic changes and initial troponin of 11 (we will follow-up repeat), low suspicion for ACS at this time.  Additionally, she appears euvolemic.  BNP is normal.  She is at baseline dry weight without exam evidence of hypervolemia, suspect her shortness of breath is more related to parenchymal lung disease/pneumonia/COPD exacerbation rather than worsening pulmonary hypertension.  She is undergoing work-up for PE with a VQ scan in the morning given her CKD. - agree with V/Q in AM - continue her home cardiac medications at this time including her home torsemide as she appears euvolemic, no need for increased diuretic doses at this time - Continue home bisoprolol, atorvastatin, Plavix -Recommend follow-up with cardiology as an outpatient and consideration of stress testing at that time -Further treatment per IM   Risk Assessment/Risk Scores:  New York Heart Association (NYHA) Functional Class NYHA Class III    For questions or updates, please contact [place] Please consult [email or web address] for contact info under    Signed, Silas Flood, MD  01/31/2022 8:10 PM

## 2022-01-31 NOTE — ED Provider Notes (Signed)
[facility] EMERGENCY DEPARTMENT Provider Note   CSN: [id number] Arrival date & time: 01/31/22  1135     History  Chief Complaint  Patient presents with   Shortness of Breath    Caitlin Oliver is a 81 y.o. female.  81 year old female with history of peripheral artery disease, TIA, COPD, CHF, pulmonary hypertension, and lung cancer in remission who presents to the emergency department with shortness of breath.  Patient reports that she has had shortness of breath and a cough for the past 4 weeks.  Says that the cough has been productive of sputum.  Denies any fevers.  Says that she has also been having chest tightness that is worsened with exertion.  Describes it as substernal and radiating to both her shoulders.  Says that this is new and that she does not typically get this with her COPD exacerbations.  Denies any diaphoresis or vomiting.  No history of MI.    Past Medical History:  Diagnosis Date   Anxiety    Arthritis    Cancer ([place])    lung cancer   Carotid artery disease (HCC)    Cataracts, bilateral    CHF (congestive heart failure) (HCC)    Claudication (HCC)    COPD (chronic obstructive pulmonary disease) ([place])    Diabetes mellitus    Dizzy    Family history of adverse reaction to anesthesia    Pt nephew has PONV   GERD (gastroesophageal reflux disease)    Headache    History of kidney stones    Hypercholesteremia    Hypertension    Peripheral arterial disease (HCC)    bilateral iliac artery stenosis by angiography   Pneumonia    Stomach ulcer    Stroke ([place])    Tobacco abuse    Wears glasses         Home Medications Prior to Admission medications   Medication Sig Start Date End Date Taking? Authorizing Provider  acetaminophen (TYLENOL) 500 MG tablet Take 500 mg by mouth every 6 (six) hours as needed for moderate pain.    [provider]  albuterol (PROVENTIL HFA;VENTOLIN HFA) 108 (90 Base) MCG/ACT inhaler Inhale 2 puffs  into the lungs every 6 (six) hours as needed for wheezing or shortness of breath.  07/11/17   [provider]  albuterol (PROVENTIL) (2.5 MG/3ML) 0.083% nebulizer solution Take 2.5 mg by nebulization in the morning and at bedtime.    [provider]  Ascorbic Acid (VITAMIN C) 1000 MG tablet Take 1,000 mg by mouth daily.    [provider]  atorvastatin (LIPITOR) 40 MG tablet Take 1 tablet (40 mg total) by mouth daily. 05/27/21   Gerrit Heck, MD  bisoprolol (ZEBETA) 5 MG tablet Take 0.5 tablets (2.5 mg total) by mouth daily. 09/22/21   Joette Catching, PA-C  carboxymethylcellul-glycerin (REFRESH OPTIVE) 0.5-0.9 % ophthalmic solution 1 drop as needed for dry eyes.    [provider]  Cholecalciferol (VITAMIN D3 PO) Take 50 mcg by mouth daily. 1 capsules daily    [provider]  clopidogrel (PLAVIX) 75 MG tablet Take 75 mg by mouth daily. 09/05/21   [provider]  cyclobenzaprine (FLEXERIL) 5 MG tablet Take 5 mg by mouth as needed. 09/05/21   [provider]  docusate sodium (COLACE) 100 MG capsule Take 100 mg by mouth 2 (two) times daily as needed for mild constipation.    [provider]  escitalopram (LEXAPRO) 10 MG tablet Take 10  mg by mouth daily.    [provider]  ferrous sulfate 325 (65 FE) MG tablet Take 325 mg by mouth daily with breakfast.    [provider]  HYDROcodone-acetaminophen (NORCO/VICODIN) 5-325 MG tablet Take 1 tablet by mouth every 6 (six) hours as needed for moderate pain. 06/21/19   Dagoberto Ligas, PA-C  hydrOXYzine (ATARAX) 25 MG tablet Take 25 mg by mouth every 8 (eight) hours as needed for itching. 09/05/21   [provider]  Magnesium 250 MG TABS Take 250 mg by mouth daily.    [provider]  meclizine (ANTIVERT) 25 MG tablet Take 25 mg by mouth daily as needed. 06/14/21   [provider]  Multiple Vitamin (MULTIVITAMIN WITH MINERALS) TABS tablet Take  1 tablet by mouth daily. 05/27/21   Gerrit Heck, MD  naphazoline-glycerin (CLEAR EYES REDNESS) 0.012-0.25 % SOLN 1-2 drops 4 (four) times daily as needed for eye irritation.    [provider]  ondansetron (ZOFRAN) 4 MG tablet Take 4 mg by mouth daily as needed for nausea.    [provider]  ONE TOUCH ULTRA TEST test strip  09/12/16   [provider]  OXYGEN Inhale 2.5 L into the lungs.    [provider]  pantoprazole (PROTONIX) 40 MG tablet Take 40 mg by mouth 2 (two) times daily. 12/17/13   [provider]  polyethylene glycol (MIRALAX / GLYCOLAX) 17 g packet Take 17 g by mouth daily as needed for mild constipation. 12/02/18   [provider]  potassium chloride SA (KLOR-CON M) 20 MEQ tablet Take 1 tablet (20 mEq total) by mouth daily. 09/22/21   Joette Catching, PA-C  torsemide (DEMADEX) 20 MG tablet Take 2 tablets (40 mg total) by mouth 2 (two) times daily. Take 2 tablet by mouth 2 times daily. 01/15/22   Joette Catching, PA-C  TRADJENTA 5 MG TABS tablet Take 5 mg by mouth daily. 09/05/21   [provider]  umeclidinium bromide (INCRUSE ELLIPTA) 62.5 MCG/ACT AEPB Inhale 1 puff into the lungs as needed. 09/05/21   [provider]      Allergies    Fentanyl    Review of Systems   Review of Systems  Physical Exam Updated Vital Signs BP 101/64   Pulse 69   Temp 98.1 F (36.7 C) (Oral)   Resp 16   Ht _0  (1.626 m)   Wt 71.7 kg   SpO2 97%   BMI 27.12 kg/m  Physical Exam Vitals and nursing note reviewed.  Constitutional:      General: She is not in acute distress.    Appearance: She is well-developed.     Comments: On 3 L nasal cannula  HENT:     Head: Normocephalic and atraumatic.     Right Ear: External ear normal.     Left Ear: External ear normal.     Nose: Nose normal.  Eyes:     Extraocular Movements: Extraocular movements intact.     Conjunctiva/sclera: Conjunctivae normal.     Pupils:  Pupils are equal, round, and reactive to light.  Cardiovascular:     Rate and Rhythm: Normal rate and regular rhythm.     Heart sounds: No murmur heard. Pulmonary:     Effort: Pulmonary effort is normal. No respiratory distress.     Comments: Slightly diminished in right hemithorax Abdominal:     Palpations: Abdomen is soft.  Musculoskeletal:        General: No swelling.  Cervical back: Normal range of motion and neck supple.     Right lower leg: No edema.     Left lower leg: No edema.  Skin:    General: Skin is warm and dry.     Capillary Refill: Capillary refill takes less than 2 seconds.  Neurological:     Mental Status: She is alert and oriented to person, place, and time. Mental status is at baseline.  Psychiatric:        Mood and Affect: Mood normal.     ED Results / Procedures / Treatments   Labs (all labs ordered are listed, but only abnormal results are displayed) Labs Reviewed  BASIC METABOLIC PANEL - Abnormal; Notable for the following components:      Result Value   Glucose, Bld 104 (*)    BUN 34 (*)    Creatinine, Ser 1.85 (*)    GFR, Estimated 27 (*)    All other components within normal limits  CBC WITH DIFFERENTIAL/PLATELET - Abnormal; Notable for the following components:   WBC 13.9 (*)    Neutro Abs 11.4 (*)    Eosinophils Absolute 0.7 (*)    Abs Immature Granulocytes 0.12 (*)    All other components within normal limits  D-DIMER, QUANTITATIVE - Abnormal; Notable for the following components:   D-Dimer, Quant 1.09 (*)    All other components within normal limits  BRAIN NATRIURETIC PEPTIDE  TROPONIN I (HIGH SENSITIVITY)  TROPONIN I (HIGH SENSITIVITY)    EKG EKG Interpretation  Date/Time:  Wednesday January 31 2022 12:00:16 EDT Ventricular Rate:  80 PR Interval:  138 QRS Duration: 66 QT Interval:  404 QTC Calculation: 465 R Axis:   55 Text Interpretation: Interpretation limited secondary to artifact Normal sinus rhythm Normal ECG When  compared with ECG of 12-Jul-2021 12:03, possible TWI in lead III that is new Confirmed by Margaretmary Eddy [phone]) on 01/31/2022 5:02:58 PM  Radiology DG Chest 2 View  Result Date: 01/31/2022 CLINICAL DATA:  Shortness of breath for 3 weeks. EXAM: CHEST - 2 VIEW COMPARISON:  AP chest 05/25/2021, 05/16/2021, CT chest 06/18/2018 FINDINGS: Heart size is mildly enlarged. There are dense calcifications within the aortic arch. Mildly to moderately decreased lung volumes. Mild bilateral chronic interstitial thickening/scarring. Trace left pleural fluid. No pneumothorax. Moderate multilevel degenerative disc changes of the thoracic spine. IMPRESSION: Chronic interstitial thickening/scarring. Chronic small left pleural effusion, appearing unchanged from 05/25/2021 frontal radiograph and 06/19/2018 CT. Electronically Signed   By: Yvonne Kendall M.D.   On: 01/31/2022 13:00    Procedures Procedures   Medications Ordered in ED Medications  aspirin tablet 325 mg (325 mg Oral Given 01/31/22 1749)  acetaminophen (TYLENOL) tablet 1,000 mg (1,000 mg Oral Given 01/31/22 1749)    ED Course/ Medical Decision Making/ A&P Clinical Course as of 01/31/22 2026  Wed Jan 31, 2022  1950 Spoke with Dr Oleh Genin from medicine who has agreed to admit the patient.  Recommends cardiology consultation at this time. [RP]  2000 Dr Gardiner Rhyme from cardiology will follow.  Feels that patient may need a stress test as an outpatient and will follow-up after VQ scan. [RP]    Clinical Course User Index [RP] Fransico Meadow, MD                           Medical Decision Making Amount and/or Complexity of Data Reviewed Labs: ordered. Radiology: ordered.  Risk OTC drugs. Prescription drug management. Decision regarding hospitalization.  DYLYNN KETNER is a 81 y.o. female with comorbidities that complicate the patient evaluation including peripheral artery disease, TIA, COPD, CHF, pulmonary hypertension, and lung cancer in  remission who presents to the emergency department with shortness of breath.  This patient presents to the ED for concern of complaints listed in HPI, this involves an extensive number of treatment options, and is a complaint that carries with it a high risk of complications and morbidity.   Initial Ddx:  MI, PE, pulmonary hypertension, CHF, pneumonia, COPD exacerbation  MDM:  Initially was concerned about possible pneumonia given the patient's cough and shortness of breath however, is not having fevers and has been refractory to several rounds of antibiotics so feel that this is less likely.  Her exertional chest pain and risk factors are concerning for possible MI and remote history of cancer and her shortness of breath also are concerning for possible pulmonary embolism though would be subacute in presentation.  Will work-up for the above etiologies and if negative feel that this may be due to her pulmonary hypertension.  Also considered COPD exacerbation but is not having significant wheezing on exam so feel that this is less likely.  Plan:  Labs Troponin BNP D-dimer Chest x-ray EKG Aspirin Tylenol  ED Summary:  Patient underwent the above work-up that did not show any ischemic changes on her EKG and showed a troponin that was WNL.  D-dimer was positive but given the patient's renal function was unable to obtain a CTA so has been put in for a VQ scan.  Did discuss the patient with cardiology who felt that she may need a stress test as an outpatient and that they will evaluate her tonight.  The patient was then admitted to medicine for further management and evaluation of her symptoms.  Dispo: Admit to Floor   Additional history obtained from daughter Records reviewed Care Everywhere The following labs were independently interpreted: Chemistry and D-dimer and show CKD and elevated dimer concerning for possible DVT I independently reviewed the following imaging with scope of  interpretation limited to determining acute life threatening conditions related to emergency care: Chest x-ray, which revealed no acute abnormality  I personally reviewed and interpreted cardiac monitoring: normal sinus rhythm  I personally reviewed and interpreted the pt's EKG: see above for interpretation  I have reviewed the patients home medications and made adjustments as needed Consults: Cardiology  Final Clinical Impression(s) / ED Diagnoses Final diagnoses:  Dyspnea, unspecified type  Chest pain, unspecified type    Rx / DC Orders ED Discharge Orders     None         Fransico Meadow, MD 01/31/22 2026

## 2022-01-31 NOTE — ED Notes (Signed)
Pt is in no apparent distress at this time.

## 2022-01-31 NOTE — H&P (Cosign Needed Addendum)
Hospital Admission History and Physical Service Pager: [phone]  Patient name: Caitlin Oliver Medical record number: [id number] Date of Birth: 18-Oct-1940 Age: 81 y.o. Gender: female  Primary Care Provider: [name], PA-C Consultants: Cardiology Code Status: DNR Preferred Emergency Contact:   Name Relation Home Work [address]   [place] Sol[place] Niece [phone]  [place] Brother [phone]  [phone]   [place], [place] [phone]  [phone]   Chief Complaint: Chest pain and shortness of breath   Assessment and Plan: Caitlin Oliver is a 81 y.o. female presenting with exertional chest pain and shortness of breath . Differential for this patient's presentation of this includes COPD exacerbation (increased O2 requirement, pleuritic chest pain), PE (elevated d-dimer), CAD (exertional dyspnea and chest tightness).  Lower suspicion of ACS due to low troponin and reassuring EKG.  Low suspicion of heart failure exacerbation, patient euvolemic and BNP within normal limits.  * Shortness of breath Patient presenting with 1 month of worsening shortness of breath with hypoxia requiring increased O2 from baseline (on 4L vs normal 3L). Unclear etiology, but has multiple risk factors for cardiac and lung etiology. Reassuringly doesn't appear volume overloaded.   - Admit to FMTS, attending Dr. Owens Shark - Cardiology consulted for recs - Continuous cardiac telemetry - V/Q scan (given CKD and GFR) - Continuous pulse ox - Supplemental O2 to maintain sats 88-92% - Vitals per routine - PT/OT eval and treat  Essential hypertension BP on the softer side upon admission. Home medications include bisprolol 2.21m daily and torsemide 444mBID. Cardiology recommended continuation of home medcations.  - Vitals per routine - Continue home bisprolol and torsemide - Monitor BP closely  - Consider orthostatic vitals    Chronic conditions - stable COPD - continue home  Incruse Ellipta and Albuterol PRN CHF - continue home torsemide, bisprolol CVA - continue ASA, Plavix 75 mg daily and atorvastatin T2DM - continue home Tradjenta GERD - continue Protonix Muscle cramps - continue home Flexeril QHS PRN and Magnesium Anxiety - Continue home Lexapro  FEN/GI: Heart healthy/carb modified VTE Prophylaxis: Lovenox  Disposition: Observation, cardiac tele  History of Present Illness:  Caitlin DULINGs a [age].o. female presenting with 4 weeks of progressively worsening chest tightness and weakness.   Patient reports that over the last month she has been having this worsened chest tightness that has not been relieved by her home inhalers. She has had 2 different rounds of antibiotics for COPD exacerbation without any improvement and has had to increase her oxygen from 3L (baseline) to at least 4L. She reports that her symptoms get worse any time that she is moving or talking.   Patient reports that today she went to an urgent care and got an EKG and CXR and was told that her EKG showed she may have a "blockage in her heart" and was recommended to go to the ER. Upon chart review, appears that EKG during that visit showed an accelerated junctional rhythm.   In the ED, patient had a cardiac work-up that showed an EKG without ischemic changes and a normal Troponin. D-dimer was elevated and patient was ordered to have a V/Q scan due to poor renal function. Cardiology was consulted and felt that she may need an outpatient stress test and would evaluate her tonight.   Review Of Systems: Per HPI with the following additions:  Positive: abdominal pain (has at baseline and is unchanged), generalized weakness, shortness of breath, leg cramps (baseline along  with neuropathy)  Denies: N/V, diarrhea, chest pain, leg swelling, abdominal distension/swelling  Pertinent Past Medical History: CODP T2DM PAD HLD Anxiety HTN Chronic respiratory failure on 2L O2 Lung cancer s/p L  upper lobectomy 8381 Chronic diastolic heart failure Remainder reviewed in history tab.   Pertinent Past Surgical History: Left upper lobectomy 2017  Remainder reviewed in history tab.   Pertinent Social History: Tobacco use: Former (quit 8 years ago, was 1.5PPD) Alcohol use: None Other Substance use: None Lives alone, has Freight forwarder (Music therapist) that helps 3 times per week. Uses a walker due to balance issues.  Pertinent Family History: Mother - HTN, stroke Father - kidney disease Remainder reviewed in history tab.   Important Outpatient Medications: Albuterol PRN Atovastatin 77m daily Bisprolol 2.549mdaily Flexeril 1044mHS Plavix 62m59mily Lexapro 10mg96mly Hydroxyzine 25mg 22mPRN for itching Meclizine 25mg d62m PRN Protonix 40mg BI54mtassium 20mEq da55mTorsemide 40mg BID 76mjenta 5mg daily 3mruse Ellipta 1 puff PRN Remainder reviewed in medication history.   Objective: BP 109/64   Pulse 64   Temp 98.1 F (36.7 C) (Oral)   Resp (!) 21   Ht _0  (1.626 m)   Wt 71.7 kg   SpO2 98%   BMI 27.12 kg/m  Exam: General -- oriented x3, pleasant and cooperative, appears chronically ill HEENT -- Head is normocephalic. PERRLA. EOMI. Ears, nose and throat were benign. Neck -- supple; no bruits. Integument -- intact. No rash, erythema, or ecchymoses.  Chest -- mild coarse breath sounds, normal WOB on 4L via [place], TTP of the chest wall Cardiac -- RRR. No murmurs noted.  Abdomen -- soft, diffusely tender, no rebound tenderness. No masses palpable. Bowel sounds present. CNS -- cranial nerves II through XII grossly intact.  Extremities - no tenderness or effusions noted. ROM good. 5/5 bilateral strength. Dorsalis pedis pulses present and symmetrical.    Labs:  CBC BMET  Recent Labs  Lab 01/31/22 1233  WBC 13.9*  HGB 12.1  HCT 37.2  PLT 389   Recent Labs  Lab 01/31/22 1233  NA 138  K 4.0  CL 99  CO2 28  BUN 34*  CREATININE 1.85*  GLUCOSE 104*  CALCIUM 9.7     Pertinent additional labs  - D-dimer 1.09 - Troponin 11>9 - BNP 61.6   EKG: Sinus rhythm with a lot of artifact    Imaging Studies Performed: DG Chest 2 View Result Date: 01/31/2022 IMPRESSION: Chronic interstitial thickening/scarring. Chronic small left pleural effusion, appearing unchanged from 05/25/2021 frontal radiograph and 06/19/2018 CT. Electronically Signed   By: Ronald  VioYvonne Kendalltin BronLeslie Dales2023, 9:48 PM PGY-1, [facility]: [phone], [phone] welcome Secure chat group [facility] [place] Resident Addendum   I have independently interviewed and examined the patient. I have discussed the above with the original author and agree with their documentation. My edits for correction/addition/clarification are in within the document. Please see also any attending notes.   Adriel Kessen LillaRise Patience, [facility] 01/31/2022 10:50 PM  [facility]PT[employer]: [phone] ([phone] welcome through [facility]

## 2022-01-31 NOTE — ED Provider Triage Note (Signed)
Emergency Medicine Provider Triage Evaluation Note  Caitlin Oliver , a 81 y.o. female  was evaluated in triage.  Pt complains of worsening shortness of breath, chest tightness and cough over the last 4 weeks.  Initially had mild sore throat and congestion with symptoms, though these have lessened.  Has already taken 2 antibiotics though unsure of the names, and tested negative for COVID since symptoms began.  Cough described as nonproductive.  Denies fever, chills, abdominal pain, back pain, or chest pain.  Hx of DMT2, COPD on 4 L at home, subclavian steal syndrome, left lung cancer, CHF, pulmonary hypertension, and hyperlipidemia.  Review of Systems  Positive:  Negative: See above  Physical Exam  BP 125/66 (BP Location: Left Arm)   Pulse 80   Temp 98.1 F (36.7 C)   Resp 16   Ht _0  (1.626 m)   Wt 71.7 kg   SpO2 100%   BMI 27.12 kg/m  Gen:   Awake, no distress   Resp:  Normal overall effort, able to communicate without difficulty, wheezing bilaterally, worse on right. MSK:   Moves extremities without difficulty  Other:  Chest non-TTP.  Medical Decision Making  Medically screening exam initiated at 2:42 PM.  Appropriate orders placed.  DLISA BARNWELL was informed that the remainder of the evaluation will be completed by another provider, this initial triage assessment does not replace that evaluation, and the importance of remaining in the ED until their evaluation is complete.  Patient on 4 L O2 at home on long cord, on 3 L O2 at home on short cord.  Patient with home oxygen in room, though oxygen tank is empty.  Patient placed on new oxygen tank in triage.   [place], PA-C 40/97/35 1442

## 2022-01-31 NOTE — ED Triage Notes (Signed)
Sent by UC for abnormal ekg "blockage" but patient denies chest pain complains of sob x 4 weeks.  Patient currently on 3L [place].  Patient complains of headache.

## 2022-01-31 NOTE — ED Notes (Signed)
Pt assisted to bedside commode. Increase work of breathing noted, but vital signs remain stable.

## 2022-02-01 ENCOUNTER — Observation Stay (HOSPITAL_COMMUNITY): Payer: PPO

## 2022-02-01 ENCOUNTER — Encounter (HOSPITAL_COMMUNITY): Payer: Self-pay | Admitting: Student

## 2022-02-01 DIAGNOSIS — K219 Gastro-esophageal reflux disease without esophagitis: Secondary | ICD-10-CM | POA: Diagnosis present

## 2022-02-01 DIAGNOSIS — F419 Anxiety disorder, unspecified: Secondary | ICD-10-CM | POA: Diagnosis present

## 2022-02-01 DIAGNOSIS — K5900 Constipation, unspecified: Secondary | ICD-10-CM | POA: Diagnosis present

## 2022-02-01 DIAGNOSIS — R0602 Shortness of breath: Secondary | ICD-10-CM | POA: Diagnosis present

## 2022-02-01 DIAGNOSIS — Z9981 Dependence on supplemental oxygen: Secondary | ICD-10-CM | POA: Diagnosis not present

## 2022-02-01 DIAGNOSIS — Z8673 Personal history of transient ischemic attack (TIA), and cerebral infarction without residual deficits: Secondary | ICD-10-CM | POA: Diagnosis not present

## 2022-02-01 DIAGNOSIS — N1832 Chronic kidney disease, stage 3b: Secondary | ICD-10-CM | POA: Diagnosis present

## 2022-02-01 DIAGNOSIS — J441 Chronic obstructive pulmonary disease with (acute) exacerbation: Secondary | ICD-10-CM | POA: Diagnosis present

## 2022-02-01 DIAGNOSIS — R0609 Other forms of dyspnea: Secondary | ICD-10-CM | POA: Diagnosis not present

## 2022-02-01 DIAGNOSIS — Z85118 Personal history of other malignant neoplasm of bronchus and lung: Secondary | ICD-10-CM | POA: Diagnosis not present

## 2022-02-01 DIAGNOSIS — N179 Acute kidney failure, unspecified: Secondary | ICD-10-CM | POA: Diagnosis not present

## 2022-02-01 DIAGNOSIS — Z79899 Other long term (current) drug therapy: Secondary | ICD-10-CM | POA: Diagnosis not present

## 2022-02-01 DIAGNOSIS — R079 Chest pain, unspecified: Secondary | ICD-10-CM

## 2022-02-01 DIAGNOSIS — J440 Chronic obstructive pulmonary disease with acute lower respiratory infection: Secondary | ICD-10-CM | POA: Diagnosis not present

## 2022-02-01 DIAGNOSIS — J209 Acute bronchitis, unspecified: Secondary | ICD-10-CM | POA: Diagnosis not present

## 2022-02-01 DIAGNOSIS — J9621 Acute and chronic respiratory failure with hypoxia: Secondary | ICD-10-CM | POA: Diagnosis present

## 2022-02-01 DIAGNOSIS — I739 Peripheral vascular disease, unspecified: Secondary | ICD-10-CM | POA: Diagnosis not present

## 2022-02-01 DIAGNOSIS — I130 Hypertensive heart and chronic kidney disease with heart failure and stage 1 through stage 4 chronic kidney disease, or unspecified chronic kidney disease: Secondary | ICD-10-CM | POA: Diagnosis present

## 2022-02-01 DIAGNOSIS — J9611 Chronic respiratory failure with hypoxia: Secondary | ICD-10-CM | POA: Insufficient documentation

## 2022-02-01 DIAGNOSIS — E1151 Type 2 diabetes mellitus with diabetic peripheral angiopathy without gangrene: Secondary | ICD-10-CM | POA: Diagnosis present

## 2022-02-01 DIAGNOSIS — E7800 Pure hypercholesterolemia, unspecified: Secondary | ICD-10-CM | POA: Diagnosis present

## 2022-02-01 DIAGNOSIS — I272 Pulmonary hypertension, unspecified: Secondary | ICD-10-CM | POA: Diagnosis present

## 2022-02-01 DIAGNOSIS — J9811 Atelectasis: Secondary | ICD-10-CM | POA: Diagnosis present

## 2022-02-01 DIAGNOSIS — I2510 Atherosclerotic heart disease of native coronary artery without angina pectoris: Secondary | ICD-10-CM | POA: Diagnosis present

## 2022-02-01 DIAGNOSIS — Z87891 Personal history of nicotine dependence: Secondary | ICD-10-CM | POA: Diagnosis not present

## 2022-02-01 DIAGNOSIS — I5032 Chronic diastolic (congestive) heart failure: Secondary | ICD-10-CM | POA: Diagnosis present

## 2022-02-01 DIAGNOSIS — Z20822 Contact with and (suspected) exposure to covid-19: Secondary | ICD-10-CM | POA: Diagnosis present

## 2022-02-01 DIAGNOSIS — G43909 Migraine, unspecified, not intractable, without status migrainosus: Secondary | ICD-10-CM | POA: Diagnosis present

## 2022-02-01 DIAGNOSIS — E1122 Type 2 diabetes mellitus with diabetic chronic kidney disease: Secondary | ICD-10-CM | POA: Diagnosis present

## 2022-02-01 DIAGNOSIS — J42 Unspecified chronic bronchitis: Secondary | ICD-10-CM | POA: Diagnosis not present

## 2022-02-01 DIAGNOSIS — R252 Cramp and spasm: Secondary | ICD-10-CM | POA: Diagnosis present

## 2022-02-01 DIAGNOSIS — Z66 Do not resuscitate: Secondary | ICD-10-CM | POA: Diagnosis present

## 2022-02-01 LAB — BASIC METABOLIC PANEL
Anion gap: 12 (ref 5–15)
BUN: 33 mg/dL — ABNORMAL HIGH (ref 8–23)
CO2: 30 mmol/L (ref 22–32)
Calcium: 9.7 mg/dL (ref 8.9–10.3)
Chloride: 98 mmol/L (ref 98–111)
Creatinine, Ser: 1.77 mg/dL — ABNORMAL HIGH (ref 0.44–1.00)
GFR, Estimated: 29 mL/min — ABNORMAL LOW (ref >60)
Glucose, Bld: 147 mg/dL — ABNORMAL HIGH (ref 70–99)
Potassium: 4 mmol/L (ref 3.5–5.1)
Sodium: 140 mmol/L (ref 135–145)

## 2022-02-01 LAB — CBC
HCT: 35.6 % — ABNORMAL LOW (ref 36.0–46.0)
Hemoglobin: 11.7 g/dL — ABNORMAL LOW (ref 12.0–15.0)
MCH: 29.5 pg (ref 26.0–34.0)
MCHC: 32.9 g/dL (ref 30.0–36.0)
MCV: 89.9 fL (ref 80.0–100.0)
Platelets: 345 10*3/uL (ref 150–400)
RBC: 3.96 MIL/uL (ref 3.87–5.11)
RDW: 11.9 % (ref 11.5–15.5)
WBC: 13.5 10*3/uL — ABNORMAL HIGH (ref 4.0–10.5)
nRBC: 0 % (ref 0.0–0.2)

## 2022-02-01 LAB — RESP PANEL BY RT-PCR (FLU A&B, COVID) ARPGX2
Influenza A by PCR: NEGATIVE
Influenza B by PCR: NEGATIVE
SARS Coronavirus 2 by RT PCR: NEGATIVE

## 2022-02-01 LAB — GLUCOSE, CAPILLARY: Glucose-Capillary: 130 mg/dL — ABNORMAL HIGH (ref 70–99)

## 2022-02-01 MED ORDER — TECHNETIUM TO 99M ALBUMIN AGGREGATED
4.0000 | Freq: Once | INTRAVENOUS | Status: AC | PRN
  Administered 2022-02-01: 4 via INTRAVENOUS

## 2022-02-01 MED ORDER — ORAL CARE MOUTH RINSE: 15.0000 mL | OROMUCOSAL | Status: DC | PRN

## 2022-02-01 MED ORDER — PROCHLORPERAZINE EDISYLATE 10 MG/2ML IJ SOLN
10.0000 mg | Freq: Once | INTRAMUSCULAR | Status: AC
  Administered 2022-02-01: 10 mg via INTRAVENOUS
  Filled 2022-02-01: qty 2

## 2022-02-01 NOTE — Assessment & Plan Note (Addendum)
Pt reports chronic occipital headache/migraines refractory to several outpatient treatments. No acute neurologic deficits or acute worsening of her symptoms. Responded well to compazine. -Continue Tylenol for pain. Pt not a good candidate for triptan -Consider migraine ppx outpatient -May consider compazine vs reglan if any worsening pain here

## 2022-02-01 NOTE — Progress Notes (Addendum)
Daily Progress Note Intern Pager: [phone]  Patient name: Caitlin Oliver Medical record number: [id number] Date of birth: 14-Nov-1940 Age: 81 y.o. Gender: female  Primary Care Provider: [name], PA-C Consultants: Cardiology Code Status: DNR  Pt Overview and Major Events to Date:  10/18-admitted  Assessment and Plan:  Caitlin Oliver is a 81 y.o. female presenting with exertional chest pain and shortness of breath.  Management as below.  Pertinent PMH includes COPD, PAD, HTN, chronic respiratory failure, lung cancer status post L upper lobectomy 2017, CHF.    * Shortness of breath Thought to be related to parenchymal lung disease vs COPD exacerbation vs less likely PNA. COVID neg. PE also remains on the differential given elevated D-dimer. Patient is currently on her home oxygen but has recently had increases in her home O2 need over the past month.  Continues to be euvolemic on exam. - Cardiology consulted, appreciate recs -Continue cardiac monitoring, pulse ox -As needed albuterol nebulizer - V/Q scan (given CKD and GFR) - Supplemental O2 to maintain sats 88-92%. Goal 2L via [place], wean as tolerated - PT/OT - Optimize inhaler regimen prior to discharge   Chest pain Patient presented with exertional/pleuritic chest pain.  No evidence of ischemia on EKG and troponins normal/flat; low suspicion for ACS.  Pain is occasionally reproducible with palpation of chest wall, suspect component of costochondritis. -Cardiology consulted, recommend outpatient follow-up and consideration of stress testing at that time -As needed Tylenol for pain control -Continue Plavix -Dc'd aspirin 348m, this was started in the ED  Headache Pt reports chronic occipital headache/migraines refractory to several outpatient treatments. No acute neurologic deficits or acute worsening of her symptoms. Responded well to compazine overnight. -Continue Tylenol for pain. Pt not a good candidate for  triptan -Consider migraine ppx outpatient -May consider compazine vs reglan if any worsening pain here  Essential hypertension Patient presented with soft blood pressures but have since normalized.  Home medications include bisprolol 2.562mdaily and torsemide 4076mID. Cardiology recommended continuation of home medcations.  - Vitals per routine - Continue home bisprolol and torsemide - Monitor BP closely  - Consider orthostatic vitals        FEN/GI: Heart healthy carb modified diet PPx: Lovenox Dispo:Pending PT recommendations  pending clinical improvement . Barriers include hypoxia, VQ scan.   Subjective:  Seen this morning.  Patient reports that her breathing is feeling better after nebulizer treatment.  Endorses headache, says that it is chronic and refractory to several treatments including Botox.  Her headache is mostly around the back of her head but she does feel it all over.  She is unable to fully characterize the pain but says that it is occasionally stabbing.  She does endorse photosensitivity and nausea. Pain is fairly constant but does worsen with certain head movements such as turning her neck to the side. Does not recall ever taking a triptan/abortive medicine, but has taken narcotics with some relief.  Objective: Temp:  [98.1 F (36.7 C)-98.5 F (36.9 C)] 98.4 F (36.9 C) (10/19 0803) Pulse Rate:  [59-106] 93 (10/19 0800) Resp:  [12-28] 25 (10/19 0800) BP: (94-133)/(57-88) 133/69 (10/19 0800) SpO2:  [90 %-100 %] 97 % (10/19 0800) Weight:  [71.7 kg] 71.7 kg (10/18 1203) Physical Exam: General: NAD, sitting up in bed, appears mildly uncomfortable HEENT: PERRLA, EOMI, +tender to palpation of posterior head Cardiovascular: RRR no MRG Respiratory: Coarse breath sounds bilaterally, on 4L O2 Abdomen: Soft, NT/ND Extremities: No lower extremity  edema  Laboratory: Most recent CBC Lab Results  Component Value Date   WBC 13.5 (H) 02/01/2022   HGB 11.7 (L)  02/01/2022   HCT 35.6 (L) 02/01/2022   MCV 89.9 02/01/2022   PLT 345 02/01/2022   Most recent BMP    Latest Ref Rng & Units 02/01/2022    4:25 AM  BMP  Glucose 70 - 99 mg/dL 147   BUN 8 - 23 mg/dL 33   Creatinine 0.44 - 1.00 mg/dL 1.77   Sodium 135 - 145 mmol/L 140   Potassium 3.5 - 5.1 mmol/L 4.0   Chloride 98 - 111 mmol/L 98   CO2 22 - 32 mmol/L 30   Calcium 8.9 - 10.3 mg/dL 9.7    COVID neg  August Albino, MD 02/01/2022, 11:59 AM  PGY-1, [place] Intern pager: [phone], text pages welcome Secure chat group [place]

## 2022-02-01 NOTE — Evaluation (Addendum)
Physical Therapy Evaluation Patient Details Name: Caitlin Oliver MRN: [id number] DOB: Jul 12, 1940 Today's Date: 02/01/2022  History of Present Illness  81 y.o. female presents to [facility] hospital on 01/31/2022 with worsening dyspnea and intermittent chest pain. PMH includes COPD, PAD, HTN, chronic respiratory failure, lung cancer s/p L upper lobectomy, CHF.  Clinical Impression  Pt presents to PT with deficits in cardiopulmonary function, endurance, gait, strength, power. Pt is largely limited by increased work of breathing and tachypnea when talking and mobilizing during session. She is able to ambulate for household distances, although this appears exhausting and the pt desats when mobilizing on 3L [place]. Pt will benefit from frequent mobilization in an effort to improve activity tolerance and restore independence. Pt declines post-acute PT follow-up.       Recommendations for follow up therapy are one component of a multi-disciplinary discharge planning process, led by the attending physician.  Recommendations may be updated based on patient status, additional functional criteria and insurance authorization.  Follow Up Recommendations No PT follow up (pt declines HHPT)      Assistance Recommended at Discharge Intermittent Supervision/Assistance  Patient can return home with the following  A little help with bathing/dressing/bathroom;A little help with walking and/or transfers;Assistance with cooking/housework;Assist for transportation;Help with stairs or ramp for entrance    Equipment Recommendations None recommended by PT  Recommendations for Other Services       Functional Status Assessment Patient has had a recent decline in their functional status and demonstrates the ability to make significant improvements in function in a reasonable and predictable amount of time.     Precautions / Restrictions Precautions Precautions: Other (comment) Precaution Comments: monitor  SpO2 Restrictions Weight Bearing Restrictions: No      Mobility  Bed Mobility Overal bed mobility: Modified Independent             General bed mobility comments: increased time, HOB elevated    Transfers Overall transfer level: Needs assistance Equipment used: Rolling walker (2 wheels) Transfers: Sit to/from Stand Sit to Stand: Supervision                Ambulation/Gait Ambulation/Gait assistance: Supervision Gait Distance (Feet): 80 Feet Assistive device: Rolling walker (2 wheels) Gait Pattern/deviations: Step-through pattern Gait velocity: reduced Gait velocity interpretation: <1.8 ft/sec, indicate of risk for recurrent falls   General Gait Details: slowed step-through gait  Stairs            Wheelchair Mobility    Modified Rankin (Stroke Patients Only)       Balance Overall balance assessment: Needs assistance Sitting-balance support: No upper extremity supported, Feet supported Sitting balance-Leahy Scale: Good     Standing balance support: Single extremity supported, Reliant on assistive device for balance Standing balance-Leahy Scale: Poor                               Pertinent Vitals/Pain Pain Assessment Pain Assessment: No/denies pain    Home Living Family/patient expects to be discharged to:: Private residence Living Arrangements: Alone Available Help at Discharge: Family;Personal care attendant;Available PRN/intermittently Type of Home: House Home Access: Ramped entrance       Home Layout: One level Home Equipment: Conservation officer, nature (2 wheels);Rollator (4 wheels);Shower seat;Adaptive equipment;BSC/3in1      Prior Function Prior Level of Function : Independent/Modified Independent             Mobility Comments: ambulates with rollator, assistance for transportation  Hand Dominance   Dominant Hand: Right    Extremity/Trunk Assessment   Upper Extremity Assessment Upper Extremity Assessment:  Overall WFL for tasks assessed    Lower Extremity Assessment Lower Extremity Assessment: Generalized weakness    Cervical / Trunk Assessment Cervical / Trunk Assessment: Kyphotic  Communication   Communication: No difficulties  Cognition Arousal/Alertness: Awake/alert Behavior During Therapy: WFL for tasks assessed/performed Overall Cognitive Status: Within Functional Limits for tasks assessed                                          General Comments General comments (skin integrity, edema, etc.): pt on 2L upon PT arrival, reports wearing 2.5-3L [place] at baseline. PT increases supplemental O2 to 2.5L and then 3L for ambulation. Pt desats to 85% when mobilizing, with RR elevated up to lo2 40s when mobilizing or talking. Pt recovers quickly when seated and resting, back to low 90s.    Exercises     Assessment/Plan    PT Assessment Patient needs continued PT services  PT Problem List Decreased activity tolerance;Decreased mobility;Decreased balance;Decreased strength;Cardiopulmonary status limiting activity       PT Treatment Interventions Gait training;DME instruction;Functional mobility training;Therapeutic exercise;Balance training;Therapeutic activities;Neuromuscular re-education;Patient/family education    PT Goals (Current goals can be found in the Care Plan section)  Acute Rehab PT Goals Patient Stated Goal: to go home and return to baseline PT Goal Formulation: With patient Time For Goal Achievement: 02/15/22 Potential to Achieve Goals: Good Additional Goals Additional Goal #1: Pt will report 1/4 DOE or less when ambulating for >200' to demonstrate improved activity tolerance    Frequency Min 3X/week     Co-evaluation               AM-PAC PT "6 Clicks" Mobility  Outcome Measure Help needed turning from your back to your side while in a flat bed without using bedrails?: None Help needed moving from lying on your back to sitting on the side of  a flat bed without using bedrails?: None Help needed moving to and from a bed to a chair (including a wheelchair)?: A Little Help needed standing up from a chair using your arms (e.g., wheelchair or bedside chair)?: A Little Help needed to walk in hospital room?: A Little Help needed climbing 3-5 steps with a railing? : A Little 6 Click Score: 20    End of Session Equipment Utilized During Treatment: Oxygen Activity Tolerance: Patient limited by fatigue Patient left: in bed;with call bell/phone within reach Nurse Communication: Mobility status PT Visit Diagnosis: Other abnormalities of gait and mobility (R26.89)    Time: 3235-5732 PT Time Calculation (min) (ACUTE ONLY): 23 min   Charges:   PT Evaluation $PT Eval Low Complexity: Jette, PT, DPT Acute Rehabilitation Office [phone]   Zenaida Niece 02/01/2022, 3:54 PM

## 2022-02-01 NOTE — Hospital Course (Addendum)
Caitlin Oliver is a 81 y.o.female with a history of COPD, PAD, HTN, chronic respiratory failure, CHF, lung cancer s/p L upper lobectomy 2017 who was admitted to the family medicine teaching Service at [facility] for acute on chronic hypoxic respiratory failure. Her hospital course is detailed below:  Shortness of breath  Acute on chronic hypoxic respiratory failure Presented with increased work of breathing, increased oxygen requirement, dyspnea.  It appears that her respiratory status has been declining over the past month prior to admission.  She had recently been treated for COPD exacerbation.  Her oxygen requirement improved with albuterol nebulizer treatment; however, she was noted to have persistent significantly increased work of breathing.  VQ scan performed (elevated D-dimer) and negative for PE.  Echo was performed and showed no significant change from prior***.  CT chest without contrast performed and showed***.  The patient was previously on Incruse Ellipta alone.  Started***during this admission.  Pulmonology was consulted due to her significant history of lung cancer s/p lobectomy and persistent decline in respiratory status.  Chest pain Presented with exertional/pleuritic chest pain.  No evidence of ischemia on EKG, troponins normal/flat.  The pain did not appear to be cardiac in nature.  Cardiology was consulted and recommended outpatient follow-up and consideration of stress testing at that time.  Echo as above.  Patient was continued on her Plavix.  Essential HTN Presented with soft blood pressures but normalized during her stay.  Continued on home bisoprolol.  Her torsemide was decreased to 40 mg daily instead of twice daily given elevated creatinine.    Other chronic conditions were medically managed with home medications and formulary alternatives as necessary (T2DM, GERD, muscle cramps, anxiety)  PCP Follow-up Recommendations: Consider migraine ppx, pt reports chronic migraines  refractory to several treatments

## 2022-02-01 NOTE — Progress Notes (Signed)
Rounding Note    Patient Name: Caitlin Oliver Date of Encounter: 02/01/2022  [place] Cardiologist: Quay Burow, MD   Subjective   Reports persistent chest pain  Inpatient Medications    Scheduled Meds:  aspirin  325 mg Oral Daily   atorvastatin  40 mg Oral Daily   bisoprolol  2.5 mg Oral Daily   clopidogrel  75 mg Oral Daily   enoxaparin (LOVENOX) injection  30 mg Subcutaneous Q24H   escitalopram  10 mg Oral Daily   linagliptin  5 mg Oral Daily   pantoprazole  40 mg Oral BID   torsemide  40 mg Oral BID   umeclidinium bromide  1 puff Inhalation Daily   Continuous Infusions:  PRN Meds: acetaminophen **OR** acetaminophen, albuterol, cyclobenzaprine   Vital Signs    Vitals:   02/01/22 0430 02/01/22 0700 02/01/22 0800 02/01/22 0803  BP: 125/88 122/61 133/69   Pulse: (!) 102 88 93   Resp: (!) 23 (!) 25 (!) 25   Temp:   98.4 F (36.9 C) 98.4 F (36.9 C)  TempSrc:   Oral Oral  SpO2: 98% 98% 97%   Weight:      Height:        Intake/Output Summary (Last 24 hours) at 02/01/2022 0844 Last data filed at 02/01/2022 0358 Gross per 24 hour  Intake --  Output 650 ml  Net -650 ml      01/31/2022   12:03 PM 01/12/2022   12:07 PM 09/22/2021   10:18 AM  Last 3 Weights  Weight (lbs) 158 lb 158 lb 3.2 oz 155 lb 12.8 oz  Weight (kg) 71.668 kg 71.759 kg 70.67 kg      Telemetry    Normal sinus rhythm- Personally Reviewed  ECG    No new EKG- Personally Reviewed  Physical Exam   GEN: Chronically ill appearing Neck: No JVD Cardiac: RRR, no murmurs Respiratory: Coarse breath sounds GI: Soft, nontender MS: No edema; No deformity. Neuro:  Nonfocal  Psych: Normal affect   Labs    High Sensitivity Troponin:   Recent Labs  Lab 01/31/22 1750 01/31/22 2008  TROPONINIHS 11 9     Chemistry Recent Labs  Lab 01/31/22 1233 02/01/22 0425  NA 138 140  K 4.0 4.0  CL 99 98  CO2 28 30  GLUCOSE 104* 147*  BUN 34* 33*  CREATININE 1.85* 1.77*   CALCIUM 9.7 9.7  GFRNONAA 27* 29*  ANIONGAP 11 12    Lipids No results for input(s): "CHOL", "TRIG", "HDL", "LABVLDL", "LDLCALC", "CHOLHDL" in the last 168 hours.  Hematology Recent Labs  Lab 01/31/22 1233 02/01/22 0425  WBC 13.9* 13.5*  RBC 4.16 3.96  HGB 12.1 11.7*  HCT 37.2 35.6*  MCV 89.4 89.9  MCH 29.1 29.5  MCHC 32.5 32.9  RDW 11.9 11.9  PLT 389 345   Thyroid No results for input(s): "TSH", "FREET4" in the last 168 hours.  BNP Recent Labs  Lab 01/31/22 1233  BNP 61.6    DDimer  Recent Labs  Lab 01/31/22 1750  DDIMER 1.09*     Radiology    DG Chest 2 View  Result Date: 01/31/2022 CLINICAL DATA:  Shortness of breath for 3 weeks. EXAM: CHEST - 2 VIEW COMPARISON:  AP chest 05/25/2021, 05/16/2021, CT chest 06/18/2018 FINDINGS: Heart size is mildly enlarged. There are dense calcifications within the aortic arch. Mildly to moderately decreased lung volumes. Mild bilateral chronic interstitial thickening/scarring. Trace left pleural fluid. No pneumothorax. Moderate multilevel  degenerative disc changes of the thoracic spine. IMPRESSION: Chronic interstitial thickening/scarring. Chronic small left pleural effusion, appearing unchanged from 05/25/2021 frontal radiograph and 06/19/2018 CT. Electronically Signed   By: Yvonne Kendall M.D.   On: 01/31/2022 13:00    Cardiac Studies   Echo 05/14/21:  1. Left ventricular ejection fraction, by estimation, is 65 to 70%. The  left ventricle has normal function. The left ventricle has no regional  wall motion abnormalities. Left ventricular diastolic parameters are  consistent with Grade I diastolic  dysfunction (impaired relaxation).   2. Right ventricular systolic function is mildly reduced. The right  ventricular size is not well visualized. There is severely elevated  pulmonary artery systolic pressure. The estimated right ventricular  systolic pressure is 38.8 mmHg.   3. The pericardial effusion is anterior to the right  ventricle. Moderate  pleural effusion in the left lateral region.   4. The mitral valve is normal in structure. No evidence of mitral valve  regurgitation. No evidence of mitral stenosis.   5. The aortic valve is normal in structure. Aortic valve regurgitation is  not visualized. No aortic stenosis is present.   6. There is borderline dilatation of the ascending aorta, measuring 39  mm.   7. The inferior vena cava is normal in size with greater than 50%  respiratory variability, suggesting right atrial pressure of 3 mmHg.   Patient Profile     81 y.o. female  with a hx of  COPD, DM, PAD, LCEA 2015, R CEA 2014, GERD, hyperlipidemia, anxiety, HTN,chronic respiratory failure on 2 liters at home, lung cancer, S/P L upper lobectomy 2017, and chronic diastolic heart failure. who is being seen 01/31/2022 for the evaluation of exertional chest pain   Assessment & Plan    Chest pain: Patient reports continuous chest pain x4 weeks, worse with movement.  EKG unremarkable, troponins negative. Likely had URI or COPD exacerbation 4 weeks ago that precipitated symptoms.  Given continuous chest pain with negative troponins, do not suspect ischemic heart disease.   -Follow-up results of VQ scan -Plan outpatient cardiology follow-up, can consider stress testing at that time.  Chronic diastolic heart failure/pulmonary hypertension: EF 65 to 70% on echo 05/14/2021, RVSP 78.  Appears euvolemic, would continue home torsemide  Chronic hypoxic respiratory failure: Due to COPD and history of lung cancer status post left upper lobe lobectomy.  On home oxygen  PAD: continue Plavix, statin    For questions or updates, please contact [place] Please consult [email or web address] for contact info under        Signed, Donato Heinz, MD  02/01/2022, 8:44 AM

## 2022-02-01 NOTE — ED Notes (Signed)
Pt called out to nurses station stating she needed to use the bathroom, pt got herself up to the bedside commode, she has obvious SOB, tachypnea, saturations 91-93% on 4 liters, pt is c/o headache with sensitivity to light and sounds. She says that when her headaches are this bad that she takes hydrocodone 1.5 tabs at home. She says she is nauseated, "but I am always nauseated" and I asked what she takes at home, "zofran, but that does not help". She is requesting something to eat to help with the nausea and her pain medication. Will send message to provider.

## 2022-02-01 NOTE — Assessment & Plan Note (Addendum)
Patient presented with exertional/pleuritic chest pain.  No evidence of ischemia on EKG and troponins normal/flat; low suspicion for ACS.  Pain is occasionally reproducible with palpation of chest wall, suspect component of costochondritis. -Cardiology consulted, recommend outpatient follow-up and consideration of stress testing at that time -As needed Tylenol for pain control -Continue Plavix

## 2022-02-01 NOTE — Progress Notes (Signed)
FMTS Interim Progress Note  S: Notified by nursing staff that patient had headache and was requesting hydrocodone.  Checking PDMP patient last had hydrocodone prescription in March.  I evaluated the patient at bedside.  She reports that she gets chronic migraines, and that " no one has been able to help me." Per patient neurosurgery has tried Botox unsuccessfully.  She says that she takes nothing for her headaches except for hydrocodone occasionally (left over from prior surgery).  O: BP 128/74   Pulse 96   Temp 98.4 F (36.9 C) (Oral)   Resp (!) 28   Ht _0  (1.626 m)   Wt 71.7 kg   SpO2 98%   BMI 27.12 kg/m   General: Nonacute distress Respiratory: Using DuoNeb breathing treatment, on 4 L nasal cannula Neuro: Alert and oriented x4, moving all 4 extremities, appropriate speech and conversation  A/P: Migraine headache -IV compazine  -Tylenol Leslie Dales, DO 02/01/2022, 4:23 AM PGY-1, [place] Service pager [phone]

## 2022-02-02 ENCOUNTER — Telehealth (HOSPITAL_COMMUNITY): Payer: Self-pay | Admitting: Pharmacy Technician

## 2022-02-02 ENCOUNTER — Inpatient Hospital Stay (HOSPITAL_COMMUNITY): Payer: PPO

## 2022-02-02 ENCOUNTER — Other Ambulatory Visit (HOSPITAL_COMMUNITY): Payer: Self-pay

## 2022-02-02 ENCOUNTER — Telehealth: Payer: Self-pay | Admitting: Acute Care

## 2022-02-02 DIAGNOSIS — J42 Unspecified chronic bronchitis: Secondary | ICD-10-CM | POA: Diagnosis not present

## 2022-02-02 DIAGNOSIS — R0602 Shortness of breath: Secondary | ICD-10-CM | POA: Diagnosis not present

## 2022-02-02 DIAGNOSIS — R109 Unspecified abdominal pain: Secondary | ICD-10-CM

## 2022-02-02 DIAGNOSIS — R0609 Other forms of dyspnea: Secondary | ICD-10-CM

## 2022-02-02 DIAGNOSIS — J9621 Acute and chronic respiratory failure with hypoxia: Secondary | ICD-10-CM | POA: Diagnosis not present

## 2022-02-02 DIAGNOSIS — R079 Chest pain, unspecified: Secondary | ICD-10-CM | POA: Diagnosis not present

## 2022-02-02 LAB — ECHOCARDIOGRAM COMPLETE
AR max vel: 2.54 cm2
AV Area VTI: 2.92 cm2
AV Area mean vel: 2.47 cm2
AV Mean grad: 2.5 mmHg
AV Peak grad: 3.7 mmHg
Ao pk vel: 0.97 m/s
Area-P 1/2: 3.62 cm2
Height: 64 in
Weight: 2435.2 oz

## 2022-02-02 LAB — BLOOD GAS, VENOUS
Acid-Base Excess: 6.4 mmol/L — ABNORMAL HIGH (ref 0.0–2.0)
Bicarbonate: 31.9 mmol/L — ABNORMAL HIGH (ref 20.0–28.0)
Drawn by: 4655
O2 Saturation: 72.6 %
Patient temperature: 37
pCO2, Ven: 48 mmHg (ref 44–60)
pH, Ven: 7.43 (ref 7.25–7.43)
pO2, Ven: 39 mmHg (ref 32–45)

## 2022-02-02 LAB — BASIC METABOLIC PANEL
Anion gap: 12 (ref 5–15)
BUN: 40 mg/dL — ABNORMAL HIGH (ref 8–23)
CO2: 26 mmol/L (ref 22–32)
Calcium: 9 mg/dL (ref 8.9–10.3)
Chloride: 96 mmol/L — ABNORMAL LOW (ref 98–111)
Creatinine, Ser: 2.05 mg/dL — ABNORMAL HIGH (ref 0.44–1.00)
GFR, Estimated: 24 mL/min — ABNORMAL LOW (ref >60)
Glucose, Bld: 213 mg/dL — ABNORMAL HIGH (ref 70–99)
Potassium: 3.7 mmol/L (ref 3.5–5.1)
Sodium: 134 mmol/L — ABNORMAL LOW (ref 135–145)

## 2022-02-02 LAB — CBC
HCT: 33.9 % — ABNORMAL LOW (ref 36.0–46.0)
Hemoglobin: 11 g/dL — ABNORMAL LOW (ref 12.0–15.0)
MCH: 28.6 pg (ref 26.0–34.0)
MCHC: 32.4 g/dL (ref 30.0–36.0)
MCV: 88.3 fL (ref 80.0–100.0)
Platelets: 356 10*3/uL (ref 150–400)
RBC: 3.84 MIL/uL — ABNORMAL LOW (ref 3.87–5.11)
RDW: 11.9 % (ref 11.5–15.5)
WBC: 11.9 10*3/uL — ABNORMAL HIGH (ref 4.0–10.5)
nRBC: 0 % (ref 0.0–0.2)

## 2022-02-02 LAB — MAGNESIUM: Magnesium: 2.2 mg/dL (ref 1.7–2.4)

## 2022-02-02 LAB — PHOSPHORUS: Phosphorus: 3.6 mg/dL (ref 2.5–4.6)

## 2022-02-02 LAB — GLUCOSE, CAPILLARY
Glucose-Capillary: 148 mg/dL — ABNORMAL HIGH (ref 70–99)
Glucose-Capillary: 122 mg/dL — ABNORMAL HIGH (ref 70–99)
Glucose-Capillary: 126 mg/dL — ABNORMAL HIGH (ref 70–99)
Glucose-Capillary: 352 mg/dL — ABNORMAL HIGH (ref 70–99)

## 2022-02-02 MED ORDER — TORSEMIDE 20 MG PO TABS
40.0000 mg | ORAL_TABLET | Freq: Every day | ORAL | Status: DC
  Administered 2022-02-02: 40 mg via ORAL
  Filled 2022-02-02: qty 2

## 2022-02-02 MED ORDER — CYCLOBENZAPRINE HCL 5 MG PO TABS
5.0000 mg | ORAL_TABLET | Freq: Two times a day (BID) | ORAL | Status: DC
  Administered 2022-02-02 – 2022-02-03 (×3): 5 mg via ORAL
  Filled 2022-02-02 (×4): qty 1

## 2022-02-02 MED ORDER — POLYETHYLENE GLYCOL 3350 17 G PO PACK
17.0000 g | PACK | Freq: Every day | ORAL | Status: DC
  Administered 2022-02-02 – 2022-02-03 (×2): 17 g via ORAL
  Filled 2022-02-02 (×2): qty 1

## 2022-02-02 MED ORDER — ALBUTEROL SULFATE (2.5 MG/3ML) 0.083% IN NEBU: 2.5000 mg | INHALATION_SOLUTION | Freq: Four times a day (QID) | RESPIRATORY_TRACT | Status: DC | PRN

## 2022-02-02 MED ORDER — ARFORMOTEROL TARTRATE 15 MCG/2ML IN NEBU
15.0000 ug | INHALATION_SOLUTION | Freq: Two times a day (BID) | RESPIRATORY_TRACT | Status: DC
  Administered 2022-02-02 – 2022-02-03 (×2): 15 ug via RESPIRATORY_TRACT
  Filled 2022-02-02 (×2): qty 2

## 2022-02-02 MED ORDER — PERFLUTREN LIPID MICROSPHERE
1.0000 mL | INTRAVENOUS | Status: AC | PRN
  Administered 2022-02-02: 2 mL via INTRAVENOUS

## 2022-02-02 MED ORDER — PREDNISONE 20 MG PO TABS
40.0000 mg | ORAL_TABLET | Freq: Every day | ORAL | Status: DC
  Administered 2022-02-02 – 2022-02-03 (×2): 40 mg via ORAL
  Filled 2022-02-02 (×2): qty 2

## 2022-02-02 MED ORDER — IPRATROPIUM-ALBUTEROL 0.5-2.5 (3) MG/3ML IN SOLN
3.0000 mL | Freq: Four times a day (QID) | RESPIRATORY_TRACT | Status: DC
  Administered 2022-02-02 – 2022-02-03 (×4): 3 mL via RESPIRATORY_TRACT
  Filled 2022-02-02 (×4): qty 3

## 2022-02-02 MED ORDER — PROCHLORPERAZINE EDISYLATE 10 MG/2ML IJ SOLN
10.0000 mg | Freq: Once | INTRAMUSCULAR | Status: DC
  Filled 2022-02-02: qty 2

## 2022-02-02 MED ORDER — REVEFENACIN 175 MCG/3ML IN SOLN
175.0000 ug | Freq: Every day | RESPIRATORY_TRACT | Status: DC
  Administered 2022-02-02 – 2022-02-03 (×2): 175 ug via RESPIRATORY_TRACT
  Filled 2022-02-02 (×2): qty 3

## 2022-02-02 MED ORDER — BUDESONIDE 0.5 MG/2ML IN SUSP
0.5000 mg | Freq: Two times a day (BID) | RESPIRATORY_TRACT | Status: DC
  Administered 2022-02-02 – 2022-02-03 (×2): 0.5 mg via RESPIRATORY_TRACT
  Filled 2022-02-02 (×2): qty 2

## 2022-02-02 MED ORDER — SENNA 8.6 MG PO TABS
1.0000 | ORAL_TABLET | Freq: Every day | ORAL | Status: DC
  Administered 2022-02-02 – 2022-02-03 (×2): 8.6 mg via ORAL
  Filled 2022-02-02 (×2): qty 1

## 2022-02-02 NOTE — TOC Benefit Eligibility Note (Addendum)
Patient Teacher, English as a foreign language completed.    The patient is currently admitted and upon discharge could be taking Anoro Ellipta 62.5-25 mcg/act.  The current 30 day co-pay is $112.57.   The patient is currently admitted and upon discharge could be taking Stiolto Respimat 2.5-2.5 mcg/act.  The current 30 day co-pay is $114.67.   The patient is currently admitted and upon discharge could be taking Serevent Diskus.  The current 30 day co-pay is $103.41.   The patient is currently admitted and upon discharge could be taking [employer].  The current 30 day co-pay is $152.92.   The patient is currently admitted and upon discharge could be taking Trelegy Ellipta.  The current 30 day co-pay is $155.89.   The patient is insured through [place], [place] Patient Advocate Specialist [facility] Patient Advocate Team Direct Number: [phone]  Fax: [phone]

## 2022-02-02 NOTE — Evaluation (Signed)
Occupational Therapy Evaluation Patient Details Name: Caitlin Oliver MRN: [id number] DOB: 07-22-1940 Today's Date: 02/02/2022   History of Present Illness 81 y.o. female presents to [facility] on 01/31/2022 with worsening dyspnea and intermittent chest pain. PMH includes COPD, PAD, HTN, chronic respiratory failure, lung cancer s/p L upper lobectomy, CHF.   Clinical Impression   This 81 yo female admitted with above presents to acute OT with PLOF of being able to do her own basic ADLs and aids doing her IADLs as well as taking her to appointments and stores. She currently is setup/S-min guard A with basic ADLs with increased time to complete due to DOE/SOB with even minimal activity. She will continue to benefit from acute OT with follow up [place].      Recommendations for follow up therapy are one component of a multi-disciplinary discharge planning process, led by the attending physician.  Recommendations may be updated based on patient status, additional functional criteria and insurance authorization.   Follow Up Recommendations  Home health OT    Assistance Recommended at Discharge Frequent or constant Supervision/Assistance  Patient can return home with the following A little help with bathing/dressing/bathroom;Assistance with cooking/housework;Assist for transportation    Functional Status Assessment  Patient has had a recent decline in their functional status and demonstrates the ability to make significant improvements in function in a reasonable and predictable amount of time.  Equipment Recommendations  None recommended by OT       Precautions / Restrictions Precautions Precaution Comments: monitor SpO2 Restrictions Weight Bearing Restrictions: No      Mobility Bed Mobility Overal bed mobility: Modified Independent                  Transfers Overall transfer level: Needs assistance Equipment used: Rolling walker (2 wheels) Transfers: Sit to/from Stand Sit to  Stand: Min guard                  Balance Overall balance assessment: Needs assistance Sitting-balance support: No upper extremity supported, Feet supported Sitting balance-Leahy Scale: Good     Standing balance support: Bilateral upper extremity supported, Reliant on assistive device for balance Standing balance-Leahy Scale: Poor                             ADL either performed or assessed with clinical judgement   ADL Overall ADL's : Needs assistance/impaired Eating/Feeding: Independent;Sitting   Grooming: Set up;Sitting   Upper Body Bathing: Set up;Sitting   Lower Body Bathing: Min guard;Sit to/from stand   Upper Body Dressing : Set up;Sitting   Lower Body Dressing: Min guard;Sit to/from stand   Toilet Transfer: Min guard;Ambulation;Grab bars;Rolling walker (2 wheels);Regular Toilet   Toileting- Water quality scientist and Hygiene: Min guard;Sit to/from stand         General ADL Comments: Pt with significant SOB with getting up, donning socks, ambulating to bathroom and back to EOB with RW. O2 sats remained 90% or higher on 3 liters; but pt had to do purse lipped breathing with cues once back to bed to calm her breathing pattern down.     Vision Baseline Vision/History: 1 Wears glasses Ability to See in Adequate Light: 0 Adequate Patient Visual Report: No change from baseline              Pertinent Vitals/Pain Pain Assessment Pain Assessment: No/denies pain     Hand Dominance Right   Extremity/Trunk Assessment Upper Extremity Assessment Upper Extremity  Assessment: Overall WFL for tasks assessed           Communication Communication Communication: No difficulties   Cognition Arousal/Alertness: Awake/alert Behavior During Therapy: WFL for tasks assessed/performed Overall Cognitive Status: Within Functional Limits for tasks assessed                                                  Home Living Family/patient  expects to be discharged to:: Private residence Living Arrangements: Alone Available Help at Discharge: Family;Personal care attendant;Available PRN/intermittently Type of Home: House Home Access: Ramped entrance     Home Layout: One level     Bathroom Shower/Tub: Occupational psychologist: Handicapped height Bathroom Accessibility: Yes How Accessible: Accessible via walker;Accessible via wheelchair Home Equipment: [place] (2 wheels);Rollator (4 wheels);Shower seat;Adaptive equipment;BSC/3in1 Adaptive Equipment: [place] Additional Comments: niece and nephew who live local, both work. 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 for 2 hours and another that comes 3 days a week for 4 hours--she said she has let the latter know she will need her 5 days a week)      Prior Functioning/Environment Prior Level of Function : Independent/Modified Independent             Mobility Comments: ambulates with rollator, assistance for transportation          OT Problem List: Cardiopulmonary status limiting activity;Decreased activity tolerance;Impaired balance (sitting and/or standing)      OT Treatment/Interventions: Self-care/ADL training;Energy conservation;Patient/family education;DME and/or AE instruction;Balance training    OT Goals(Current goals can be found in the care plan section) Acute Rehab OT Goals Patient Stated Goal: for breathing to get better and be able to go home OT Goal Formulation: With patient Time For Goal Achievement: 02/16/22 Potential to Achieve Goals: Good  OT Frequency: Min 2X/week       AM-PAC OT "6 Clicks" Daily Activity     Outcome Measure Help from another person eating meals?: None Help from another person taking care of personal grooming?: A Little Help from another person toileting, which includes using toliet, bedpan, or urinal?: A Little Help from another person bathing (including washing, rinsing, drying)?: A Little Help from  another person to put on and taking off regular upper body clothing?: A Little Help from another person to put on and taking off regular lower body clothing?: A Little 6 Click Score: 19   End of Session Equipment Utilized During Treatment: Gait belt;Rolling walker (2 wheels);Oxygen (3 liters (pt states she has turned her O2 at home up to 4 liters on he own))  Activity Tolerance:  (limited by SOB) Patient left: in bed;with call bell/phone within reach;with bed alarm set  OT Visit Diagnosis: Unsteadiness on feet (R26.81);Other abnormalities of gait and mobility (R26.89);Muscle weakness (generalized) (M62.81)                Time: 3762-8315 OT Time Calculation (min): 26 min Charges:  OT General Charges $OT Visit: 1 Visit OT Evaluation $OT Eval Moderate Complexity: 1 Mod OT Treatments $Self Care/Home Management : 8-22 mins  [place], OTR/L Acute Re[facility] [place] [phone] Office [phone]    Almon Register 02/02/2022, 4:29 PM

## 2022-02-02 NOTE — Assessment & Plan Note (Addendum)
Thought to be related to parenchymal lung disease versus COPD versus less likely pneumonia.  S/p left upper lobe lobectomy for lung cancer.  Patient also with a history of chronic diastolic heart failure and pulmonary hypertension.  Euvolemic on exam. Echo with EF 60-65% (previous in 04/2021 65-70%), G1DD (stable), RV enlargement, trivial MV regurg (new). New CXR with bibasilar atelectasis. VBG yesterday with pH 7.43, pCO2 48, and bicarb 31.9. Overall, she is stable and can likely continue workup outpatient. -Appreciate cardiology recs, likely no further workup given mild decline in function -Appreciate pulmonology recs -Continue cardiac monitoring, pulse ox -As needed albuterol nebulizer -Supplemental O2 to maintain sats 88-92%. Goal 2L via [place], wean as tolerated -PT/OT -Optimize inhaler regimen prior to discharge  - will need LABA + LAMA

## 2022-02-02 NOTE — Progress Notes (Signed)
Daily Progress Note Intern Pager: [phone]  Patient name: Caitlin Oliver Medical record number: [id number] Date of birth: 1940/06/21 Age: 81 y.o. Gender: female  Primary Care Provider: [name], PA-C Consultants: Cardiology Code Status: DNR  Pt Overview and Major Events to Date:  10/18-admitted  Assessment and Plan:  Caitlin Oliver is a 81 y.o. female presenting with exertional chest pain and shortness of breath.  Management as below.  Pertinent PMH includes COPD, PAD, HTN, chronic respiratory failure, lung cancer status post L upper lobectomy 2017, CHF.   * Acute on chronic hypoxic respiratory failure (HCC) Thought to be related to parenchymal lung disease versus COPD versus less likely pneumonia.  S/p left upper lobe lobectomy for lung cancer.  Patient also with a history of chronic diastolic heart failure and pulmonary hypertension.  Euvolemic on exam.  Her respiratory status has been declining over the past month with no clear etiology, warranting further workup. -Appreciate cardiology recs - ok to f/u outpatient if normal echo -Echocardiogram -CT chest wo contrast -VBG -Consult pulm, appreciate recs -Continue cardiac monitoring, pulse ox -As needed albuterol nebulizer - Supplemental O2 to maintain sats 88-92%. Goal 2L via [place], wean as tolerated - PT/OT - Optimize inhaler regimen prior to discharge  - will need LABA + LAMA  Abdominal discomfort Endorses nausea and constipation. Abdominal exam reassuring -IV compazine x1 -scheduled miralax, senna  Chest pain Patient presented with exertional/pleuritic chest pain.  No evidence of ischemia on EKG and troponins normal/flat; low suspicion for ACS.  Pain is occasionally reproducible with palpation of chest wall, suspect component of costochondritis. -Cardiology consulted, recommend outpatient follow-up and consideration of stress testing at that time -As needed Tylenol for pain control -Continue  Plavix   Headache Pt reports chronic occipital headache/migraines refractory to several outpatient treatments. No acute neurologic deficits or acute worsening of her symptoms. Responded well to compazine. -Continue Tylenol for pain. Pt not a good candidate for triptan -Consider migraine ppx outpatient -May consider compazine vs reglan if any worsening pain here  Essential hypertension Patient presented with soft blood pressures but have since normalized.  Home medications include bisprolol and torsemide. Cardiology recommended continuation of home medcations.  - Vitals per routine - Continue home bisprolol - Torsemide 73m daily instead of BID given elevated Cr - Monitor BP closely  - Consider orthostatic vitals        FEN/GI: Heart healthy carb modified diet PPx: Lovenox Dispo:Home pending clinical improvement . Barriers include echo, hypoxia.   Subjective:  Pt seen this morning, undergoing echo. Pt reports feeling "lousy" this morning but about the same overall as yesterday. She endorses some nausea and constipation. She still has some SOB with movement.   Objective: Temp:  [98.3 F (36.8 C)-99 F (37.2 C)] 98.3 F (36.8 C) (10/20 0707) Pulse Rate:  [87-95] 95 (10/20 0707) Resp:  [19-31] 24 (10/20 0707) BP: (112-142)/(60-85) 139/73 (10/20 0707) SpO2:  [91 %-100 %] 95 % (10/20 0707) Weight:  [69 kg-69.2 kg] 69 kg (10/20 0441) Physical Exam: General: NAD, appears uncomfortable, getting echo Cardiovascular: unable to assess due to echo Respiratory: faint expiratory wheezes and course breath sounds bilaterally Abdomen: soft, mild discomfort to palpation  Laboratory: Most recent CBC Lab Results  Component Value Date   WBC 11.9 (H) 02/02/2022   HGB 11.0 (L) 02/02/2022   HCT 33.9 (L) 02/02/2022   MCV 88.3 02/02/2022   PLT 356 02/02/2022   Most recent BMP    Latest Ref Rng &  Units 02/02/2022   12:38 AM  BMP  Glucose 70 - 99 mg/dL 213   BUN 8 - 23 mg/dL 40    Creatinine 0.44 - 1.00 mg/dL 2.05   Sodium 135 - 145 mmol/L 134   Potassium 3.5 - 5.1 mmol/L 3.7   Chloride 98 - 111 mmol/L 96   CO2 22 - 32 mmol/L 26   Calcium 8.9 - 10.3 mg/dL 9.0     Mg, Phos WNL  Imaging/Diagnostic Tests:  VQ scan IMPRESSION: Normal examination.  No evidence of pulmonary embolism.  August Albino, MD 02/02/2022, 10:50 AM  PGY-1, [place] Intern pager: [phone], text pages welcome Secure chat group [place]

## 2022-02-02 NOTE — Consult Note (Addendum)
NAME:  Caitlin Oliver, MRN:  [id number], DOB:  [birth year], LOS: 1 ADMISSION DATE:  01/31/2022, CONSULTATION DATE:  10/20 REFERRING MD:  Owens Shark, CHIEF COMPLAINT:  COPD exacerbation    History of Present Illness:  29 yof f/b Dr Shearon Stalls from our clinic. On our last notes suggest concern for disease progression. Presents 10/18 w/ cc: increasing SOB, weakness and intermittent CP over 4 week time frame.  This was initially seen in the outpatient setting by primary care provider who initially placed her on a 5-day course of antibiotic and prednisone.  She was seen once again about 2 weeks prior to this admission and again placed on another course of antibiotics  CP was increased w/ cough, She was admitted w/ working dx of AECOPD.  Admitted to [facility] service. Treatment has included: supplental oxygen & Bronchodilators. , VQ to r/o PE (was negative), and cards consult for CP->felt 2/2 cough following URI.   Pertinent  Medical History  PAD, HTN, lung cancer w/ LUL lobectomy 2017, chronic resp failure (home oxygen) Mod COPD FEV1 62% predicted Group 2-3PH H/o tobacco abuse DNR  CKD stage IIIIB/IV Last seen in our office 2022 t that point Dr Shearon Stalls had concerned decline  Significant Hospital Events: Including procedures, antibiotic start and stop dates in addition to other pertinent events   10/18 admitted with working diagnosis of COPD exacerbation 10/20 pulmonary asked to evaluate.  Echocardiogram EF 60 to 65% no left wall regional abnormalities.  1 diastolic dysfunction RV function mildly reduced RA mildly enlarged  Interim History / Subjective:  Feels a little better  Objective   Blood pressure 139/73, pulse 95, temperature 98.3 F (36.8 C), temperature source Oral, resp. rate (Abnormal) 24, height _0  (1.626 m), weight 69 kg, SpO2 95 %.        Intake/Output Summary (Last 24 hours) at 02/02/2022 1236 Last data filed at 02/02/2022 1019 Gross per 24 hour  Intake 480 ml  Output 1200 ml  Net -720  ml   Filed Weights   01/31/22 1203 02/01/22 2320 02/02/22 0441  Weight: 71.7 kg 69.2 kg 69 kg    Examination: General: 81 year old white female lying in bed no acute distress HENT: Normocephalic atraumatic mucous membranes moist no JVD Lungs: Faint crackles bases with expiratory wheezes bilaterally does report shortness of breath exerting herself and talking Cardiovascular: Regular rate and rhythm Abdomen: Soft nontender Extremities: Warm dry no significant edema Neuro: Awake alert GU: Voids  Resolved Hospital Problem list     Assessment & Plan:   Acute on chronic respiratory failure Acute exacerbation chronic struct of pulmonary disease, baseline FEV1 62% predicted 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 Recent URI, likely viral CKD stage III/IV Remote history of tobacco abuse DNR status   Acute on chronic respiratory failure secondary to acute exacerbation chronic struct of pulmonary disease -We had concerns about disease progression for over a year now, she would likely benefit from triple combo therapy given the severity of her disease burden Plan/rec Continue supplemental oxygen We will add scheduled Brovana, Yupelri, and budesonide When she goes home should probably go on either Trelegy ellipta OR a combo of Incruse Ellipta and Breo ellipta, Dulera or what ever combo LABA/ICS is covered best by her insurance (in addition to her home Incruse). Add scheduled short acting bronchodilator with plan to change to as needed once again when symptoms improved We will start her back on prednisone 40, I think she needs a longer taper than 5 days, would start to taper by  70m/d every 2-3 d after day 5 Cont PPI Assess daily for diuresis given her underlying pH and diastolic dysfunction We will make sure we see her in our clinic within 7 to 10 days of discharge I think we can forego antibiotics as she has had 2 rounds already and no longer has purulent sputum We will hold off on CT of  chest, lets treat bronchospasm first, we can revisit whether or not she has concomitant ILD as an outpatient although honestly I do not think it would change my approach much at this point   Best Practice (right click and "Reselect all SmartList Selections" daily)   Per primary  Labs   CBC: Recent Labs  Lab 01/31/22 1233 02/01/22 0425 02/02/22 0038  WBC 13.9* 13.5* 11.9*  NEUTROABS 11.4*  --   --   HGB 12.1 11.7* 11.0*  HCT 37.2 35.6* 33.9*  MCV 89.4 89.9 88.3  PLT 389 345 3156   Basic Metabolic Panel: Recent Labs  Lab 01/31/22 1233 02/01/22 0425 02/02/22 0038  NA 138 140 134*  K 4.0 4.0 3.7  CL 99 98 96*  CO2 _0 GLUCOSE 104* 147* 213*  BUN 34* 33* 40*  CREATININE 1.85* 1.77* 2.05*  CALCIUM 9.7 9.7 9.0  MG  --   --  2.2  PHOS  --   --  3.6   GFR: Estimated Creatinine Clearance: 20.5 mL/min (A) (by C-G formula based on SCr of 2.05 mg/dL (H)). Recent Labs  Lab 01/31/22 1233 02/01/22 0425 02/02/22 0038  WBC 13.9* 13.5* 11.9*    Liver Function Tests: No results for input(s): "AST", "ALT", "ALKPHOS", "BILITOT", "PROT", "ALBUMIN" in the last 168 hours. No results for input(s): "LIPASE", "AMYLASE" in the last 168 hours. No results for input(s): "AMMONIA" in the last 168 hours.  ABG    Component Value Date/Time   PHART 7.410 05/18/2021 1700   PCO2ART 60.5 (H) 05/18/2021 1700   PO2ART 76.2 (L) 05/18/2021 1700   HCO3 31.9 (H) 02/02/2022 1120   TCO2 34 (H) 05/15/2021 0434   ACIDBASEDEF 0.5 02/01/2016 1400   O2SAT 72.6 02/02/2022 1120     Coagulation Profile: No results for input(s): "INR", "PROTIME" in the last 168 hours.  Cardiac Enzymes: No results for input(s): "CKTOTAL", "CKMB", "CKMBINDEX", "TROPONINI" in the last 168 hours.  HbA1C: Hgb A1c MFr Bld  Date/Time Value Ref Range Status  05/17/2021 02:31 AM 6.6 (H) 4.8 - 5.6 % Final    Comment:    (NOTE) Pre diabetes:          5.7%-6.4%  Diabetes:              >6.4%  Glycemic control for    <7.0% adults with diabetes   05/13/2021 12:28 PM 7.0 (H) 4.8 - 5.6 % Final    Comment:    (NOTE) Pre diabetes:          5.7%-6.4%  Diabetes:              >6.4%  Glycemic control for   <7.0% adults with diabetes     CBG: Recent Labs  Lab 02/01/22 2351 02/02/22 0630 02/02/22 1120  GLUCAP 130* 148* 122*    Review of Systems:   Review of Systems  Constitutional:  Positive for malaise/fatigue. Negative for fever.  HENT:  Positive for congestion and sore throat.   Eyes: Negative.   Respiratory:  Positive for cough, sputum production, shortness of breath and wheezing.   Cardiovascular:  Positive for  chest pain and leg swelling.  Gastrointestinal:  Negative for heartburn.  Genitourinary: Negative.   Musculoskeletal: Negative.   Skin: Negative.   Endo/Heme/Allergies: Negative.      Past Medical History:  She,  has a past medical history of Anxiety, Arthritis, Cancer ([place]), Carotid artery disease ([place]), Cataracts, bilateral, CHF (congestive heart failure) ([place]), Claudication ([place]), COPD (chronic obstructive pulmonary disease) ([place]), Diabetes mellitus, Dizzy, Family history of adverse reaction to anesthesia, GERD (gastroesophageal reflux disease), Headache, History of kidney stones, Hypercholesteremia, Hypertension, Peripheral arterial disease ([place]), Pneumonia, Stomach ulcer, Stroke ([place]), Tobacco abuse, and Wears glasses.   Surgical History:   Past Surgical History:  Procedure Laterality Date   ABDOMINAL AORTIC ENDOVASCULAR STENT GRAFT Right 06/19/2019   Procedure: INNOMINATE STENT, RIGHT CAROTID STENT, RIGHT SUBCLAVIAN STENT AND CAROTID CUTDOWN;  Surgeon: Marty Heck, MD;  Location: [place];  Service: Vascular;  Laterality: Right;   ABDOMINAL HYSTERECTOMY     APPENDECTOMY     BREAST REDUCTION SURGERY     CAROTID ANGIOGRAM N/A 02/25/2013   Procedure: CAROTID ANGIOGRAM;  Surgeon: Lorretta Harp, MD;  Location[facility] CATH LAB;  Service: Cardiovascular;  Laterality: N/A;    ENDARTERECTOMY Right 03/06/2013   Procedure: ENDARTERECTOMY CAROTID-RIGHT;  Surgeon: Serafina Mitchell, MD;  Location: [place];  Service: Vascular;  Laterality: Right;   ENDARTERECTOMY Left 05/07/2013   Procedure: LEFT CAROTID ARTERY ENDARTERECTOMY WITH VASCU-GUARD PATCH ANGIOPLASTY ;  Surgeon: Serafina Mitchell, MD;  Location: [place];  Service: Vascular;  Laterality: Left;   INTRAOPERATIVE ARTERIOGRAM N/A 06/19/2019   Procedure: Clydie Braun;  Surgeon: Marty Heck, MD;  Location: [place];  Service: Vascular;  Laterality: N/A;   IR ANGIO INTRA EXTRACRAN SEL COM CAROTID INNOMINATE UNI R MOD SED  05/14/2019   IR ANGIO VERTEBRAL SEL SUBCLAVIAN INNOMINATE BILAT MOD SED  05/14/2019   LOBECTOMY Left 02/03/2016   Procedure: LEFT UPPER LOBECTOMY;  Surgeon: [name], MD;  Location: [place];  Service: Thoracic;  Laterality: Left;   LOWER EXTREMITY ANGIOGRAM N/A 02/25/2013   Procedure: LOWER EXTREMITY ANGIOGRAM;  Surgeon: Lorretta Harp, MD;  Location: [facility] CATH LAB;  Service: Cardiovascular;  Laterality: N/A;   LOWER EXTREMITY ANGIOGRAM N/A 07/27/2013   Procedure: LOWER EXTREMITY ANGIOGRAM;  Surgeon: Lorretta Harp, MD;  Location: [facility] CATH LAB;  Service: Cardiovascular;  Laterality: N/A;   PATCH ANGIOPLASTY Right 03/06/2013   Procedure: PATCH ANGIOPLASTY of Right Carotid Artery using Vascu-Guard Patch;  Surgeon: Serafina Mitchell, MD;  Location: [place];  Service: Vascular;  Laterality: Right;   PERIPHERAL VASCULAR CATHETERIZATION N/A 11/15/2015   Procedure: Aortic Arch Angiography;  Surgeon: Serafina Mitchell, MD;  Location: [place] CV LAB;  Service: Cardiovascular;  Laterality: N/A;   PERIPHERAL VASCULAR CATHETERIZATION Bilateral 11/15/2015   Procedure: Carotid Angiography;  Surgeon: Serafina Mitchell, MD;  Location: [place] CV LAB;  Service: Cardiovascular;  Laterality: Bilateral;   PERIPHERAL VASCULAR CATHETERIZATION Left 11/15/2015   Procedure: Upper Extremity Angiography;  Surgeon: Serafina Mitchell,  MD;  Location: [place] CV LAB;  Service: Cardiovascular;  Laterality: Left;   PERIPHERAL VASCULAR CATHETERIZATION Left 11/15/2015   Procedure: Peripheral Vascular Intervention;  Surgeon: Serafina Mitchell, MD;  Location: [place] CV LAB;  Service: Cardiovascular;  Laterality: Left;  subclavian    RIGHT HEART CATH N/A 06/16/2019   Procedure: RIGHT HEART CATH;  Surgeon: Jolaine Artist, MD;  Location: [place] CV LAB;  Service: Cardiovascular;  Laterality: N/A;   RIGHT/LEFT HEART CATH AND  CORONARY ANGIOGRAPHY N/A 11/20/2018   Procedure: RIGHT/LEFT HEART CATH AND CORONARY ANGIOGRAPHY;  Surgeon: Nelva Bush, MD;  Location: [place] CV LAB;  Service: Cardiovascular;  Laterality: N/A;   SALIVARY GLAND SURGERY     scar tissue removed from left saliva glad   TIBIA IM NAIL INSERTION Right 05/23/2021   Procedure: INTRAMEDULLARY (IM) NAIL TIBIAL;  Surgeon: Altamese Ideal, MD;  Location: [place];  Service: Orthopedics;  Laterality: Right;   TUBAL LIGATION     ULTRASOUND GUIDANCE FOR VASCULAR ACCESS Right 06/19/2019   Procedure: Ultrasound Guidance For Vascular Access;  Surgeon: Marty Heck, MD;  Location: [place];  Service: Vascular;  Laterality: Right;   VIDEO ASSISTED THORACOSCOPY (VATS)/WEDGE RESECTION Left 02/03/2016   Procedure: VIDEO ASSISTED THORACOSCOPY;  Surgeon: [name], MD;  Location: [place];  Service: Thoracic;  Laterality: Left;     Social History:   reports that she quit smoking about 8 years ago. Her smoking use included cigarettes. She has a 84.00 pack-year smoking history. She quit smokeless tobacco use about 8 years ago. She reports that she does not drink alcohol and does not use drugs.   Family History:  Her family history includes Hypertension in her mother; Kidney failure in her father; Stroke in her mother. There is no history of Lung disease.   Allergies Allergies  Allergen Reactions   Fentanyl Shortness Of Breath     Home Medications  Prior to  Admission medications   Medication Sig Start Date End Date Taking? Authorizing Provider  acetaminophen (TYLENOL) 500 MG tablet Take 500 mg by mouth every 6 (six) hours as needed for moderate pain.   Yes [provider]  albuterol (PROVENTIL HFA;VENTOLIN HFA) 108 (90 Base) MCG/ACT inhaler Inhale 2 puffs into the lungs every 6 (six) hours as needed for wheezing or shortness of breath.  07/11/17  Yes [provider]  albuterol (PROVENTIL) (2.5 MG/3ML) 0.083% nebulizer solution Take 2.5 mg by nebulization in the morning and at bedtime.   Yes [provider]  Ascorbic Acid (VITAMIN C) 1000 MG tablet Take 1,000 mg by mouth daily.   Yes [provider]  atorvastatin (LIPITOR) 40 MG tablet Take 1 tablet (40 mg total) by mouth daily. 05/27/21  Yes Gerrit Heck, MD  bisoprolol (ZEBETA) 5 MG tablet Take 0.5 tablets (2.5 mg total) by mouth daily. 09/22/21  Yes Joette Catching, PA-C  carboxymethylcellul-glycerin (REFRESH OPTIVE) 0.5-0.9 % ophthalmic solution 1 drop as needed for dry eyes.   Yes [provider]  Cholecalciferol (VITAMIN D3 PO) Take 50 mcg by mouth daily. 1 capsules daily   Yes [provider]  clopidogrel (PLAVIX) 75 MG tablet Take 75 mg by mouth daily. 09/05/21  Yes [provider]  cyclobenzaprine (FLEXERIL) 5 MG tablet Take 5 mg by mouth as needed. 09/05/21  Yes [provider]  docusate sodium (COLACE) 100 MG capsule Take 100 mg by mouth 2 (two) times daily as needed for mild constipation.   Yes [provider]  escitalopram (LEXAPRO) 10 MG tablet Take 10 mg by mouth daily.   Yes [provider]  ferrous sulfate 325 (65 FE) MG tablet Take 325 mg by mouth daily with breakfast.   Yes [provider]  HYDROcodone-acetaminophen (NORCO/VICODIN) 5-325 MG tablet Take 1 tablet by mouth every 6 (six) hours as needed for moderate pain. 06/21/19  Yes Dagoberto Ligas, PA-C  hydrOXYzine (ATARAX) 25 MG  tablet Take 25 mg by mouth every 8 (eight) hours as needed  for itching. 09/05/21  Yes [provider]  Magnesium 250 MG TABS Take 250 mg by mouth daily.   Yes [provider]  meclizine (ANTIVERT) 25 MG tablet Take 25 mg by mouth daily as needed. 06/14/21  Yes [provider]  Multiple Vitamin (MULTIVITAMIN WITH MINERALS) TABS tablet Take 1 tablet by mouth daily. 05/27/21  Yes Gerrit Heck, MD  naphazoline-glycerin (CLEAR EYES REDNESS) 0.012-0.25 % SOLN 1-2 drops 4 (four) times daily as needed for eye irritation.   Yes [provider]  ondansetron (ZOFRAN) 4 MG tablet Take 4 mg by mouth daily as needed for nausea.   Yes [provider]  pantoprazole (PROTONIX) 40 MG tablet Take 40 mg by mouth 2 (two) times daily. 12/17/13  Yes [provider]  polyethylene glycol (MIRALAX / GLYCOLAX) 17 g packet Take 17 g by mouth daily as needed for mild constipation. 12/02/18  Yes [provider]  potassium chloride SA (KLOR-CON M) 20 MEQ tablet Take 1 tablet (20 mEq total) by mouth daily. 09/22/21  Yes Joette Catching, PA-C  torsemide (DEMADEX) 20 MG tablet Take 2 tablets (40 mg total) by mouth 2 (two) times daily. Take 2 tablet by mouth 2 times daily. 01/15/22  Yes Joette Catching, PA-C  TRADJENTA 5 MG TABS tablet Take 5 mg by mouth daily. 09/05/21  Yes [provider]  umeclidinium bromide (INCRUSE ELLIPTA) 62.5 MCG/ACT AEPB Inhale 1 puff into the lungs as needed. 09/05/21  Yes [provider]  glimepiride (AMARYL) 1 MG tablet Take 1 mg by mouth every morning. Patient not taking: Reported on 01/31/2022 01/18/22   [provider]  ONE TOUCH ULTRA TEST test strip  09/12/16   [provider]  OXYGEN Inhale 2.5 L into the lungs.    [provider]     Critical care time: NA   Erick Colace ACNP-BC [place] Pager # [phone] OR # [phone] if no answer

## 2022-02-02 NOTE — Assessment & Plan Note (Signed)
Endorses nausea and constipation. Abdominal exam reassuring -IV compazine x1 -scheduled miralax, senna

## 2022-02-02 NOTE — Telephone Encounter (Signed)
Patient scheduled for HFU on 10/31 at 1:30pm with Rexene Edison, NP. Reminder mailed to address on file- nothing further needed.

## 2022-02-02 NOTE — TOC Initial Note (Signed)
Transition of Care [facility]) - Initial/Assessment Note    Patient Details  Name: Caitlin Oliver MRN: [id number] Date of Birth: [birth year]  [facility] CM/SW Contact:    Zenon Mayo, RN Phone Number: 02/02/2022, 3:17 PM  Clinical Narrative:                 From home alone, she states she has PCS services to help her clean her home and get groceries.  She has a rollator at home that she uses and a ramp, home oxygen with [employer] 2-3 liters,  and a shower chair.  She states she gets her meds from the [facility] in [place].  She states she will have transport at dc by her niece or her friend.  Per physical therapy eval rec HHPT, per note from today.  NCM offered choice with Medicare. Gov agency list, she states she does not have a preference.  NCM made referral to [facility] with [facility].  He states he can take referral.  Soc will begin 24 to 48 hrs post dc.    Expected Discharge Plan: [place] Barriers to Discharge: Continued Medical Work up   Patient Goals and CMS Choice Patient states their goals for this hospitalization and ongoing recovery are:: return home CMS Medicare.gov Compare Post Acute Care list provided to:: Patient Choice offered to / list presented to : Patient  Expected Discharge Plan and Services Expected Discharge Plan: [place] In-house Referral: NA Discharge Planning Services: CM Consult Post Acute Care Choice: [place] arrangements for the past 2 months: Single Family Home                   DME Agency: NA       HH Arranged: PT HH Agency: [facility] Date [place] Agency Contacted: 02/02/22 Time HH Agency Contacted: 73 Representative spoke with at [place]: [place]  Prior Living Arrangements/Services Living arrangements for the past 2 months: [place] with:: Self Patient language and need for interpreter reviewed:: Yes Do you feel safe going back to the place where  you live?: Yes      Need for Family Participation in Patient Care: No (Comment) Care giver support system in place?: Yes (comment) Current home services: DME (rollator, home oxygen 2-3 liters with [employer]) Criminal Activity/Legal Involvement Pertinent to Current Situation/Hospitalization: No - Comment as needed  Activities of Daily Living      Permission Sought/Granted                  Emotional Assessment Appearance:: Appears stated age Attitude/Demeanor/Rapport: Engaged Affect (typically observed): Appropriate Orientation: : Oriented to Self, Oriented to Place, Oriented to  Time, Oriented to Situation Alcohol / Substance Use: Not Applicable Psych Involvement: No (comment)  Admission diagnosis:  Shortness of breath [R06.02] SOB (shortness of breath) [R06.02] Dyspnea, unspecified type [R06.00] Chest pain, unspecified type [R07.9] Patient Active Problem List   Diagnosis Date Noted   Abdominal discomfort 02/02/2022   Chronic bronchitis (HCC)    SOB (shortness of breath) 02/01/2022   Chronic hypoxemic respiratory failure ([place])    Pulmonary hypertension, unspecified ([place]) 09/07/2021   Protein-calorie malnutrition, severe 05/26/2021   Delirium 05/15/2021   Acute on chronic hypoxic respiratory failure ([place]) 05/15/2021   Tibia/fibula fracture 05/12/2021   Acute on chronic heart failure with preserved ejection fraction (HFpEF) ([place])    Chest pain 06/16/2018   Headache 06/19/2016   Cancer of left lung (  [place]) 02/03/2016   Solitary pulmonary nodule 12/08/2015   Subclavian steal syndrome 10/28/2015   COPD 10/18/2013   Hypoxia 10/10/2013   PAD (peripheral artery disease) ([place]) 07/29/2013   Occlusion and stenosis of carotid artery without mention of cerebral infarction 03/02/2013   Carotid artery obstruction 03/02/2013   Tobacco abuse 02/27/2013   Essential hypertension 01/20/2013   Type 2 diabetes mellitus ([place]) 01/20/2013   Hyperlipidemia 01/20/2013   PCP:  [name], PA-C Pharmacy:   [place], [place] - [address] [place] [address] Phone: [phone] Fax: [place], [place] - [address] [place] [address] Phone: [phone] Fax: [phone]     Social Determinants of Health (SDOH) Interventions    Readmission Risk Interventions    02/02/2022    3:14 PM 05/15/2021    2:01 PM  Readmission Risk Prevention Plan  Transportation Screening Complete Complete  PCP or Specialist Appt within 3-5 Days Complete Not Complete  Not Complete comments  not ready for d/c  [place] or [place] Complete Complete  Social Work Consult for [place] Planning/Counseling Complete Complete  Palliative Care Screening Not Applicable Not Applicable  Medication Review Press photographer) Complete Referral to Pharmacy

## 2022-02-02 NOTE — Progress Notes (Incomplete)
Echocardiogram 2D Echocardiogram has been performed.  Caitlin Oliver 02/02/2022, 10:11 AM

## 2022-02-02 NOTE — Telephone Encounter (Signed)
Pharmacy Patient Advocate Encounter  Insurance verification completed.    The patient is insured through [employer] Part D   The patient is currently admitted and ran test claims for the following: Anoro Ellipta .  Copays and coinsurance results were relayed to Inpatient clinical team.

## 2022-02-02 NOTE — Progress Notes (Signed)
   02/02/22 1048  Mobility  Activity Transferred from bed to chair  Level of Assistance Standby assist, set-up cues, supervision of patient - no hands on  Assistive Device None  Distance Ambulated (ft) 2 ft  Activity Response Tolerated well  Mobility Referral Yes  $Mobility charge 1 Mobility   Mobility Specialist Progress Note  Pt was in bed and agreeable to transfer. Had no c/o pain. Left in chair w/ all needs met and call bell in reach.   Lucious Groves Mobility Specialist

## 2022-02-02 NOTE — Progress Notes (Signed)
Per discussion with Occupational Therapy the patient is now open to [place] PT services. PT updates recommendations to HHPT at this time as the pt is now open to pursuing post-acute therapies.  Zenaida Niece, PT, DPT Acute Rehabilitation Office [phone]

## 2022-02-02 NOTE — Progress Notes (Addendum)
Rounding Note    Patient Name: Caitlin Oliver Date of Encounter: 02/02/2022  [place] Cardiologist: Quay Burow, MD   Subjective   Dyspneic. On 24/7 home O2 for at least 2 years now, usually requires 3L/min, however has been using 4L/min before she came in.   Inpatient Medications    Scheduled Meds:  atorvastatin  40 mg Oral Daily   bisoprolol  2.5 mg Oral Daily   clopidogrel  75 mg Oral Daily   enoxaparin (LOVENOX) injection  30 mg Subcutaneous Q24H   escitalopram  10 mg Oral Daily   linagliptin  5 mg Oral Daily   pantoprazole  40 mg Oral BID   polyethylene glycol  17 g Oral Daily   prochlorperazine  10 mg Intravenous Once   senna  1 tablet Oral Daily   torsemide  40 mg Oral BID   umeclidinium bromide  1 puff Inhalation Daily   Continuous Infusions:  PRN Meds: acetaminophen **OR** acetaminophen, albuterol, cyclobenzaprine, mouth rinse   Vital Signs    Vitals:   02/01/22 1800 02/01/22 2320 02/02/22 0441 02/02/22 0707  BP: 132/64 (!) 142/60 112/60 139/73  Pulse: 95  92 95  Resp: 19 (!) 22 (!) 22 (!) 24  Temp:  98.9 F (37.2 C) 99 F (37.2 C) 98.3 F (36.8 C)  TempSrc:  Oral Oral Oral  SpO2: 91% 98% 95% 95%  Weight:  69.2 kg 69 kg   Height:  5' 4" (1.626 m)      Intake/Output Summary (Last 24 hours) at 02/02/2022 0906 Last data filed at 02/02/2022 0700 Gross per 24 hour  Intake 480 ml  Output 1000 ml  Net -520 ml      02/02/2022    4:41 AM 02/01/2022   11:20 PM 01/31/2022   12:03 PM  Last 3 Weights  Weight (lbs) 152 lb 3.2 oz 152 lb 8.9 oz 158 lb  Weight (kg) 69.037 kg 69.2 kg 71.668 kg      Telemetry    Sinus tachycardia, with HR 90-100s - Personally Reviewed  ECG    NSR without significant ST-T wave changes - Personally Reviewed  Physical Exam   GEN: No acute distress.  Dyspneic. On [place] O2.  Neck: No JVD Cardiac: RRR, no murmurs, rubs, or gallops.  Respiratory: Clear to auscultation bilaterally. GI: Soft, nontender,  non-distended  MS: No edema; No deformity. Neuro:  Nonfocal  Psych: Normal affect   Labs    High Sensitivity Troponin:   Recent Labs  Lab 01/31/22 1750 01/31/22 2008  TROPONINIHS 11 9     Chemistry Recent Labs  Lab 01/31/22 1233 02/01/22 0425 02/02/22 0038  NA 138 140 134*  K 4.0 4.0 3.7  CL 99 98 96*  CO2 _0 GLUCOSE 104* 147* 213*  BUN 34* 33* 40*  CREATININE 1.85* 1.77* 2.05*  CALCIUM 9.7 9.7 9.0  MG  --   --  2.2  GFRNONAA 27* 29* 24*  ANIONGAP _1 Lipids No results for input(s): "CHOL", "TRIG", "HDL", "LABVLDL", "LDLCALC", "CHOLHDL" in the last 168 hours.  Hematology Recent Labs  Lab 01/31/22 1233 02/01/22 0425 02/02/22 0038  WBC 13.9* 13.5* 11.9*  RBC 4.16 3.96 3.84*  HGB 12.1 11.7* 11.0*  HCT 37.2 35.6* 33.9*  MCV 89.4 89.9 88.3  MCH 29.1 29.5 28.6  MCHC 32.5 32.9 32.4  RDW 11.9 11.9 11.9  PLT 389 345 356   Thyroid No results for input(s): "TSH", "FREET4" in  the last 168 hours.  BNP Recent Labs  Lab 01/31/22 1233  BNP 61.6    DDimer  Recent Labs  Lab 01/31/22 1750  DDIMER 1.09*     Radiology    NM Pulmonary Perfusion  Result Date: 02/01/2022 CLINICAL DATA:  Shortness of breath for 3 weeks. Elevated D-dimer. COPD. EXAM: NUCLEAR MEDICINE PERFUSION LUNG SCAN TECHNIQUE: Perfusion images were obtained in multiple projections after intravenous injection of radiopharmaceutical. Ventilation scans intentionally deferred if perfusion scan and chest x-ray adequate for interpretation during COVID 19 epidemic. RADIOPHARMACEUTICALS:  4 mCi Tc-72mMAA IV COMPARISON:  Portable chest obtained yesterday. FINDINGS: Normal perfusion of both lungs.  No perfusion defects seen. IMPRESSION: Normal examination.  No evidence of pulmonary embolism. Electronically Signed   By: SClaudie ReveringM.D.   On: 02/01/2022 11:25   DG Chest 2 View  Result Date: 01/31/2022 CLINICAL DATA:  Shortness of breath for 3 weeks. EXAM: CHEST - 2 VIEW COMPARISON:  AP chest  05/25/2021, 05/16/2021, CT chest 06/18/2018 FINDINGS: Heart size is mildly enlarged. There are dense calcifications within the aortic arch. Mildly to moderately decreased lung volumes. Mild bilateral chronic interstitial thickening/scarring. Trace left pleural fluid. No pneumothorax. Moderate multilevel degenerative disc changes of the thoracic spine. IMPRESSION: Chronic interstitial thickening/scarring. Chronic small left pleural effusion, appearing unchanged from 05/25/2021 frontal radiograph and 06/19/2018 CT. Electronically Signed   By: RYvonne KendallM.D.   On: 01/31/2022 13:00    Cardiac Studies   Echo 05/14/2021 1. Left ventricular ejection fraction, by estimation, is 65 to 70%. The  left ventricle has normal function. The left ventricle has no regional  wall motion abnormalities. Left ventricular diastolic parameters are  consistent with Grade I diastolic  dysfunction (impaired relaxation).   2. Right ventricular systolic function is mildly reduced. The right  ventricular size is not well visualized. There is severely elevated  pulmonary artery systolic pressure. The estimated right ventricular  systolic pressure is 700.7mmHg.   3. The pericardial effusion is anterior to the right ventricle. Moderate  pleural effusion in the left lateral region.   4. The mitral valve is normal in structure. No evidence of mitral valve  regurgitation. No evidence of mitral stenosis.   5. The aortic valve is normal in structure. Aortic valve regurgitation is  not visualized. No aortic stenosis is present.   6. There is borderline dilatation of the ascending aorta, measuring 39  mm.   7. The inferior vena cava is normal in size with greater than 50%  respiratory variability, suggesting right atrial pressure of 3 mmHg.   Patient Profile     81y.o. female with PMH of COPD, HTN, HLD, DM II, chronic respiratory failure on 2L [place], lung CA s/p L upper lobectomy 26226 chronic diastolic CHF, PAD (L CEA 23335 R  CEA 2014) and anxiety who presented with chest pain  Assessment & Plan    Chest pain  - reported continuous chest pain for 4 weeks, worse with movement. No significant EKG changes, troponin negative. Felt to be related to URI or COPD exacerbation 4 weeks ago. Low suspicion for ischemic heart disease. Not ideal candidate for invasive work up given Cr 2. VQ scan normal. Pending echo, if no changes then no further workup during this admission. May consider outpatient myoview or PET stress test on follow up.   Chronic diastolic CHF/pulmonary HTN: previous echo in Jan 2023 showed elevated RVSP of 78, likely related to pulmonary issue.  Chronic hypoxic respiratory failure  CAD: mild  to moderate nonobstructive CAD noted on previous cath 11/20/2018  PAD HTN HLD DM II      For questions or updates, please contact [place] Please consult [email or web address] for contact info under        Signed, Almyra Deforest, [place]  02/02/2022, 9:06 AM    Patient seen and examined.  Agree with above documentation.  On exam, patient is alert and oriented, regular rate and rhythm, no murmurs, increased WOB, scattered wheezing, no LE edema or JVD.  VQ scan negative for PE.  We will follow-up echocardiogram.  If unchanged, can plan outpatient follow-up for consideration of stress test.  Donato Heinz, MD

## 2022-02-03 DIAGNOSIS — J440 Chronic obstructive pulmonary disease with acute lower respiratory infection: Secondary | ICD-10-CM

## 2022-02-03 DIAGNOSIS — R079 Chest pain, unspecified: Secondary | ICD-10-CM | POA: Diagnosis not present

## 2022-02-03 DIAGNOSIS — J209 Acute bronchitis, unspecified: Secondary | ICD-10-CM

## 2022-02-03 DIAGNOSIS — J9621 Acute and chronic respiratory failure with hypoxia: Secondary | ICD-10-CM | POA: Diagnosis not present

## 2022-02-03 LAB — CBC
HCT: 33.5 % — ABNORMAL LOW (ref 36.0–46.0)
Hemoglobin: 11 g/dL — ABNORMAL LOW (ref 12.0–15.0)
MCH: 28.6 pg (ref 26.0–34.0)
MCHC: 32.8 g/dL (ref 30.0–36.0)
MCV: 87 fL (ref 80.0–100.0)
Platelets: 337 10*3/uL (ref 150–400)
RBC: 3.85 MIL/uL — ABNORMAL LOW (ref 3.87–5.11)
RDW: 11.7 % (ref 11.5–15.5)
WBC: 6.7 10*3/uL (ref 4.0–10.5)
nRBC: 0 % (ref 0.0–0.2)

## 2022-02-03 LAB — BASIC METABOLIC PANEL
Anion gap: 12 (ref 5–15)
BUN: 42 mg/dL — ABNORMAL HIGH (ref 8–23)
CO2: 25 mmol/L (ref 22–32)
Calcium: 9.2 mg/dL (ref 8.9–10.3)
Chloride: 95 mmol/L — ABNORMAL LOW (ref 98–111)
Creatinine, Ser: 1.99 mg/dL — ABNORMAL HIGH (ref 0.44–1.00)
GFR, Estimated: 25 mL/min — ABNORMAL LOW (ref >60)
Glucose, Bld: 277 mg/dL — ABNORMAL HIGH (ref 70–99)
Potassium: 4 mmol/L (ref 3.5–5.1)
Sodium: 132 mmol/L — ABNORMAL LOW (ref 135–145)

## 2022-02-03 LAB — GLUCOSE, CAPILLARY
Glucose-Capillary: 173 mg/dL — ABNORMAL HIGH (ref 70–99)
Glucose-Capillary: 184 mg/dL — ABNORMAL HIGH (ref 70–99)

## 2022-02-03 MED ORDER — SENNA 8.6 MG PO TABS: 1.0000 | ORAL_TABLET | Freq: Every day | ORAL | 0 refills | Status: AC

## 2022-02-03 MED ORDER — FLUTICASONE FUROATE-VILANTEROL 200-25 MCG/ACT IN AEPB: 1.0000 | INHALATION_SPRAY | Freq: Every day | RESPIRATORY_TRACT | 0 refills | Status: DC

## 2022-02-03 MED ORDER — TORSEMIDE 20 MG PO TABS: 40.0000 mg | ORAL_TABLET | Freq: Two times a day (BID) | ORAL | 4 refills | Status: DC

## 2022-02-03 MED ORDER — POTASSIUM CHLORIDE CRYS ER 20 MEQ PO TBCR: 20.0000 meq | EXTENDED_RELEASE_TABLET | Freq: Every day | ORAL | 3 refills | Status: DC

## 2022-02-03 MED ORDER — PREDNISONE 10 MG PO TABS: ORAL_TABLET | ORAL | 0 refills | Status: AC

## 2022-02-03 NOTE — TOC Transition Note (Signed)
Transition of Care [facility]) - CM/SW Discharge Note   Patient Details  Name: Caitlin Oliver MRN: [id number] Date of Birth: 07-24-1940  Transition [facility] Care [facility] CM/SW Contact:  Carles Collet, RN Phone Number: 02/03/2022, 12:13 PM   Clinical Narrative:     Tyson Babinski of discharge. No new CM needs identified for DC.    Final next level of care: [place] Barriers to Discharge: No Barriers Identified   Patient Goals and CMS Choice Patient states their goals for this hospitalization and ongoing recovery are:: return home CMS Medicare.gov Compare Post Acute Care list provided to:: Patient Choice offered to / list presented to : Patient  Discharge Placement                       Discharge Plan and Services In-house Referral: NA Discharge Planning Services: CM Consult Post Acute Care Choice: Home Health            DME Agency: NA       HH Arranged: PT HH Agency: [facility] Date [place]: 02/03/22 Time [place]: 1209 Representative spoke with at [place]: [place] ([place]) Interventions     Readmission Risk Interventions    02/02/2022    3:14 PM 05/15/2021    2:01 PM  Readmission Risk Prevention Plan  Transportation Screening Complete Complete  PCP or Specialist Appt within 3-5 Days Complete Not Complete  Not Complete comments  not ready for d/c  [place] or [place] Complete Complete  Social Work Consult for [place] Planning/Counseling Complete Complete  Palliative Care Screening Not Applicable Not Applicable  Medication Review Press photographer) Complete Referral to Pharmacy

## 2022-02-03 NOTE — Progress Notes (Signed)
Patient discharged: Home with family  Via: Wheelchair   Discharge paperwork given: to patient and family  Reviewed with teach back  IV and telemetry disconnected  Belongings given to patient

## 2022-02-03 NOTE — Discharge Summary (Addendum)
[facility] Discharge Summary  Patient name: Caitlin Oliver Medical record number: [id number] Date of birth: 1940/09/18 Age: 81 y.o. Gender: female Date of Admission: 01/31/2022  Date of Discharge: 02/03/2022 Admitting Physician: Leslie Dales, DO  Primary Care Provider: [name], PA-C Consultants: Cardiology, pulmonology  Indication for Hospitalization: Acute on chronic hypoxic respiratory failure  Brief Hospital Course:  Caitlin Oliver is a 81 y.o.female with a history of COPD, PAD, HTN, chronic respiratory failure, CHF, lung cancer s/p L upper lobectomy 2017 who was admitted to the family medicine teaching Service at [facility] for acute on chronic hypoxic respiratory failure. Her hospital course is detailed below:  Acute on chronic hypoxic respiratory failure likely 2/2 COPD exacerbation Presented with increased work of breathing and increased oxygen requirement for last month. She had recently been treated for COPD exacerbation. She was given albuterol and arformoterol nebulizers during admission with improvement; however, she was noted to have persistent significantly increased work of breathing. VQ scan performed due to renal fn (elevated D-dimer) and negative for PE. Echo was performed and showed no significant change from prior. Pulmonology consulted due to her significant history of lung cancer s/p lobectomy and persistent decline in respiratory status. They recommended prolonged prednisone taper with triple therapy inhaler.She felt significant improvement with this change.  By discharge, she was breathing and satting well on home O2 with close outpatient follow up scheduled.  Chest pain Presented with exertional/pleuritic chest pain.  No evidence of ischemia on EKG, troponins normal/flat.  The pain did not appear to be cardiac in nature.  Cardiology was consulted and recommended outpatient follow-up and consideration of stress testing at that time.   Echo as above.  Patient was continued on her Plavix.  Acute Kidney Injury (AKI) on CKD 3B Cr bumped 1.77>2.05 over admission with diuresis, decreased PO intake. Torsemide dosing was switched to daily. Cr mildly improved to 1.99. At discharge, torsemide was held and scheduled to restart on 10/23 given creatinine still slightly above baseline.   Essential HTN Presented with soft blood pressures but normalized during her stay.  Continued on home bisoprolol.  Her torsemide was decreased to 40 mg daily instead of twice daily given elevated creatinine. Her Cr remained elevated at discharge; torsemide was discontinued with plan to restart on 10/23. Blood pressure improved with these changes.   Other chronic conditions were medically managed with home medications and formulary alternatives as necessary (T2DM, GERD, muscle cramps, anxiety)  PCP Follow-up Recommendations: F/u respiratory status and BP control Consider migraine ppx, pt reports chronic migraines refractory to several treatments May consider outpatient stress test on follow up if continues to have chest pain once COPD treated (though COPD would need to be better controlled before considering Myoview) BMP at follow up in 1 week Recommend monitoring glucose closely while on steroid course Ensure completing steroid course (taper set as 40 mg for 4 additional days, 30 mg for 5 days, 20 mg for 5 days and 10 mg for 5 days) Recommend pulmonology f/u  Discharge Diagnoses/Problem List:  Principal Problem for Admission: Acute on chronic hypoxic respiratory failure Present on Admission:  Essential hypertension  SOB (shortness of breath)  Disposition: Home with PT/OT  Discharge Condition: Stable  Discharge Exam:  Blood pressure 114/69, pulse 82, temperature 98 F (36.7 C), temperature source Oral, resp. rate 20, height _0  (1.626 m), weight 68.7 kg, SpO2 96 %.  General: Alert and oriented, in NAD Skin: Warm, dry HEENT: NCAT, EOM grossly  normal, midline nasal septum Cardiac: RRR, no m/r/g appreciated Respiratory: CTAB, breathing and speaking comfortably on 3L [place] Extremities: Moves all extremities grossly equally in bed Neurological: No gross focal deficit Psychiatric: Appropriate mood and affect   Significant Labs and Imaging:  Recent Labs  Lab 02/02/22 0038 02/03/22 0054  WBC 11.9* 6.7  HGB 11.0* 11.0*  HCT 33.9* 33.5*  PLT 356 337   Recent Labs  Lab 02/02/22 0038 02/03/22 0054  NA 134* 132*  K 3.7 4.0  CL 96* 95*  CO2 26 25  GLUCOSE 213* 277*  BUN 40* 42*  CREATININE 2.05* 1.99*  CALCIUM 9.0 9.2  MG 2.2  --   PHOS 3.6  --    CXR 10/18 IMPRESSION: Chronic interstitial thickening/scarring. Chronic small left pleural effusion, appearing unchanged from 05/25/2021 frontal radiograph and 06/19/2018 CT.  VQ scan IMPRESSION: Normal examination.  No evidence of pulmonary embolism.  CXR 10/20 IMPRESSION: Bibasilar atelectasis  Echo IMPRESSIONS   1. Left ventricular ejection fraction, by estimation, is 60 to 65%. The  left ventricle has normal function. The left ventricle has no regional  wall motion abnormalities. Left ventricular diastolic parameters are  consistent with Grade I diastolic  dysfunction (impaired relaxation).   2. Right ventricular systolic function is moderately reduced. The right  ventricular size is mildly enlarged.   3. The mitral valve is normal in structure. Trivial mitral valve  regurgitation.   4. The aortic valve is normal in structure. Aortic valve regurgitation is  not visualized.  Discharge Medications:  Allergies as of 02/03/2022       Reactions   Fentanyl Shortness Of Breath        Medication List     STOP taking these medications    glimepiride 1 MG tablet Commonly known as: AMARYL   HYDROcodone-acetaminophen 5-325 MG tablet Commonly known as: NORCO/VICODIN   hydrOXYzine 25 MG tablet Commonly known as: ATARAX       TAKE these medications     acetaminophen 500 MG tablet Commonly known as: TYLENOL Take 500 mg by mouth every 6 (six) hours as needed for moderate pain.   albuterol (2.5 MG/3ML) 0.083% nebulizer solution Commonly known as: PROVENTIL Take 2.5 mg by nebulization in the morning and at bedtime.   albuterol 108 (90 Base) MCG/ACT inhaler Commonly known as: VENTOLIN HFA Inhale 2 puffs into the lungs every 6 (six) hours as needed for wheezing or shortness of breath.   atorvastatin 40 MG tablet Commonly known as: LIPITOR Take 1 tablet (40 mg total) by mouth daily.   bisoprolol 5 MG tablet Commonly known as: ZEBETA Take 0.5 tablets (2.5 mg total) by mouth daily.   carboxymethylcellul-glycerin 0.5-0.9 % ophthalmic solution Commonly known as: REFRESH OPTIVE 1 drop as needed for dry eyes.   clopidogrel 75 MG tablet Commonly known as: PLAVIX Take 75 mg by mouth daily.   cyclobenzaprine 5 MG tablet Commonly known as: FLEXERIL Take 5 mg by mouth as needed.   docusate sodium 100 MG capsule Commonly known as: COLACE Take 100 mg by mouth 2 (two) times daily as needed for mild constipation.   ferrous sulfate 325 (65 FE) MG tablet Take 325 mg by mouth daily with breakfast.   fluticasone furoate-vilanterol 200-25 MCG/ACT Aepb Commonly known as: Breo Ellipta Inhale 1 puff into the lungs daily.   Lexapro 10 MG tablet Generic drug: escitalopram Take 10 mg by mouth daily.   Magnesium 250 MG Tabs Take 250 mg by mouth daily.   meclizine 25 MG  tablet Commonly known as: ANTIVERT Take 25 mg by mouth daily as needed.   multivitamin with minerals Tabs tablet Take 1 tablet by mouth daily.   naphazoline-glycerin 0.012-0.25 % Soln Commonly known as: CLEAR EYES REDNESS 1-2 drops 4 (four) times daily as needed for eye irritation.   ondansetron 4 MG tablet Commonly known as: ZOFRAN Take 4 mg by mouth daily as needed for nausea.   ONE TOUCH ULTRA TEST test strip Generic drug: glucose blood   OXYGEN Inhale 2.5 L  into the lungs.   pantoprazole 40 MG tablet Commonly known as: PROTONIX Take 40 mg by mouth 2 (two) times daily.   polyethylene glycol 17 g packet Commonly known as: MIRALAX / GLYCOLAX Take 17 g by mouth daily as needed for mild constipation.   potassium chloride SA 20 MEQ tablet Commonly known as: KLOR-CON M Take 1 tablet (20 mEq total) by mouth daily. Start taking on: February 05, 2022 What changed: These instructions start on February 05, 2022. If you are unsure what to do until then, ask your doctor or other care provider.   predniSONE 10 MG tablet Commonly known as: DELTASONE Take 4 tablets (40 mg total) by mouth daily with breakfast for 4 days, THEN 3 tablets (30 mg total) daily with breakfast for 5 days, THEN 2 tablets (20 mg total) daily with breakfast for 5 days, THEN 1 tablet (10 mg total) daily with breakfast for 5 days. Start taking on: February 04, 2022   senna 8.6 MG Tabs tablet Commonly known as: SENOKOT Take 1 tablet (8.6 mg total) by mouth daily. Start taking on: February 04, 2022   torsemide 20 MG tablet Commonly known as: DEMADEX Take 2 tablets (40 mg total) by mouth 2 (two) times daily. Take 2 tablet by mouth 2 times daily. Start taking on: February 05, 2022 What changed: These instructions start on February 05, 2022. If you are unsure what to do until then, ask your doctor or other care provider.   Tradjenta 5 MG Tabs tablet Generic drug: linagliptin Take 5 mg by mouth daily.   umeclidinium bromide 62.5 MCG/ACT Aepb Commonly known as: INCRUSE ELLIPTA Inhale 1 puff into the lungs as needed.   vitamin C 1000 MG tablet Take 1,000 mg by mouth daily.   VITAMIN D3 PO Take 50 mcg by mouth daily. 1 capsules daily        Discharge Instructions: Please refer to Patient Instructions section of EMR for full details.  Patient was counseled important signs and symptoms that should prompt return to medical care, changes in medications, dietary instructions, activity  restrictions, and follow up appointments.   Follow-Up Appointments:  Follow-up Information     Lenna Sciara, NP Follow up on 03/07/2022.   Specialties: Nurse Practitioner, Family Medicine Why: _0 :49FW. Cardiology follow up with Dr. Kennon Holter nurse practitioner Contact information: [address] [place] [address] [phone]         [name], PA-C. Go on 02/07/2022.   Specialty: Family Medicine Why: _1 [place] information: [address] [place]  [address] [phone]         Golda A[place], [facility] Follow up.   Specialty: Home Health Services Why: Agency will contact you to set up apt times. Contact information: [place] [address] [phone]                Ethelene Hal, MD 02/03/2022, 11:51 AM PGY-1, [facility] Upper  Level Addendum:  I have seen and evaluated this patient along with Dr. Marcha Dutton and reviewed the above note, making necessary revisions as appropriate.  I agree with the medical decision making and physical exam as noted above.  Gerrit Heck, MD PGY-2 [facility] Family Medicine Residency

## 2022-02-03 NOTE — Progress Notes (Signed)
02/03/22 0833  Assess: MEWS Score  Temp 97.6 F (36.4 C)  BP (!) 90/46  Pulse Rate 97  Resp 20  SpO2 96 %  O2 Device Nasal Cannula  O2 Flow Rate (L/min) 3 L/min  Assess: MEWS Score  MEWS Temp 0  MEWS Systolic 1  MEWS Pulse 0  MEWS RR 0  MEWS LOC 0  MEWS Score 1  MEWS Score Color Green  Assess: if the MEWS score is Yellow or Red  Were vital signs taken at a resting state? Yes  Focused Assessment No change from prior assessment  Does the patient meet 2 or more of the SIRS criteria? No  MEWS guidelines implemented *See Row Information* No, vital signs rechecked  Assess: SIRS CRITERIA  SIRS Temperature  0  SIRS Pulse 1  SIRS Respirations  0  SIRS WBC 1  SIRS Score Sum  2

## 2022-02-03 NOTE — Progress Notes (Signed)
Rounding Note    Patient Name: Caitlin Oliver Date of Encounter: 02/03/2022  [place] Cardiologist: Quay Burow, MD   Subjective   Continue to have dyspnea and chest pain  Inpatient Medications    Scheduled Meds:  arformoterol  15 mcg Nebulization BID   atorvastatin  40 mg Oral Daily   bisoprolol  2.5 mg Oral Daily   budesonide (PULMICORT) nebulizer solution  0.5 mg Nebulization BID   clopidogrel  75 mg Oral Daily   cyclobenzaprine  5 mg Oral BID   enoxaparin (LOVENOX) injection  30 mg Subcutaneous Q24H   escitalopram  10 mg Oral Daily   ipratropium-albuterol  3 mL Nebulization Q6H   linagliptin  5 mg Oral Daily   pantoprazole  40 mg Oral BID   polyethylene glycol  17 g Oral Daily   predniSONE  40 mg Oral Q breakfast   prochlorperazine  10 mg Intravenous Once   revefenacin  175 mcg Nebulization Daily   senna  1 tablet Oral Daily   Continuous Infusions:  PRN Meds: acetaminophen **OR** acetaminophen, albuterol, mouth rinse   Vital Signs    Vitals:   02/03/22 0429 02/03/22 0710 02/03/22 0828 02/03/22 0833  BP: 126/67  (!) 92/38 (!) 90/46  Pulse: 88  99 97  Resp:   (!) 25 20  Temp: 98 F (36.7 C)  97.6 F (36.4 C) 97.6 F (36.4 C)  TempSrc: Oral  Oral   SpO2: 96% 97% 96% 96%  Weight: 68.7 kg     Height:        Intake/Output Summary (Last 24 hours) at 02/03/2022 0842 Last data filed at 02/03/2022 0834 Gross per 24 hour  Intake 657 ml  Output 2100 ml  Net -1443 ml       02/03/2022    4:29 AM 02/02/2022    4:41 AM 02/01/2022   11:20 PM  Last 3 Weights  Weight (lbs) 151 lb 8 oz 152 lb 3.2 oz 152 lb 8.9 oz  Weight (kg) 68.72 kg 69.037 kg 69.2 kg      Telemetry    Sinus tachycardia, with HR 90-100s - Personally Reviewed  ECG    NSR without significant ST-T wave changes - Personally Reviewed  Physical Exam   GEN: No acute distress.  Dyspneic. On [place] O2.  Neck: No JVD Cardiac: RRR, no murmurs, rubs, or gallops.  Respiratory:  Clear to auscultation bilaterally. GI: Soft, nontender, non-distended  MS: No edema; No deformity. Neuro:  Nonfocal  Psych: Normal affect   Labs    High Sensitivity Troponin:   Recent Labs  Lab 01/31/22 1750 01/31/22 2008  TROPONINIHS 11 9      Chemistry Recent Labs  Lab 02/01/22 0425 02/02/22 0038 02/03/22 0054  NA 140 134* 132*  K 4.0 3.7 4.0  CL 98 96* 95*  CO2 _0 GLUCOSE 147* 213* 277*  BUN 33* 40* 42*  CREATININE 1.77* 2.05* 1.99*  CALCIUM 9.7 9.0 9.2  MG  --  2.2  --   GFRNONAA 29* 24* 25*  ANIONGAP _1 Lipids No results for input(s): "CHOL", "TRIG", "HDL", "LABVLDL", "LDLCALC", "CHOLHDL" in the last 168 hours.  Hematology Recent Labs  Lab 02/01/22 0425 02/02/22 0038 02/03/22 0054  WBC 13.5* 11.9* 6.7  RBC 3.96 3.84* 3.85*  HGB 11.7* 11.0* 11.0*  HCT 35.6* 33.9* 33.5*  MCV 89.9 88.3 87.0  MCH 29.5 28.6 28.6  MCHC 32.9 32.4 32.8  RDW  11.9 11.9 11.7  PLT 345 356 337    Thyroid No results for input(s): "TSH", "FREET4" in the last 168 hours.  BNP Recent Labs  Lab 01/31/22 1233  BNP 61.6     DDimer  Recent Labs  Lab 01/31/22 1750  DDIMER 1.09*      Radiology    DG CHEST PORT 1 VIEW  Result Date: 02/02/2022 CLINICAL DATA:  Hypoxia EXAM: PORTABLE CHEST 1 VIEW COMPARISON:  01/31/22 CXR FINDINGS: No pleural effusion. No pneumothorax. No new focal airspace opacity. Bibasilar atelectasis. Gas distended stomach. Aortic vascular calcifications. Radiographically apparent displaced rib fractures. IMPRESSION: Bibasilar atelectasis Electronically Signed   By: Marin Roberts M.D.   On: 02/02/2022 13:49   ECHOCARDIOGRAM COMPLETE  Result Date: 02/02/2022    ECHOCARDIOGRAM REPORT   Patient Name:   Caitlin Oliver Date of Exam: 02/02/2022 Medical Rec #:  [id number]      Height:       64.0 in Accession #:    [id number]     Weight:       152.2 lb Date of Birth:  01-30-1941      BSA:          1.742 m Patient Age:    81 years       BP:            112/60 mmHg Patient Gender: F              HR:           91 bpm. Exam Location:  Inpatient Procedure: 2D Echo, Cardiac Doppler, Color Doppler and Intracardiac            Opacification Agent Indications:    Dyspnea R06.00  History:        Patient has prior history of Echocardiogram examinations, most                 recent 05/14/2021. PAD and COPD, Signs/Symptoms:Chest Pain and                 Shortness of Breath; Risk Factors:Hypertension, Dyslipidemia,                 Current Smoker and Diabetes. Lung Cancer.  Sonographer:    Ronny Flurry Referring Phys: [id number] CARINA M BROWN  Sonographer Comments: Technically difficult study due to poor echo windows, suboptimal parasternal window and suboptimal subcostal window. IMPRESSIONS  1. Left ventricular ejection fraction, by estimation, is 60 to 65%. The left ventricle has normal function. The left ventricle has no regional wall motion abnormalities. Left ventricular diastolic parameters are consistent with Grade I diastolic dysfunction (impaired relaxation).  2. Right ventricular systolic function is moderately reduced. The right ventricular size is mildly enlarged.  3. The mitral valve is normal in structure. Trivial mitral valve regurgitation.  4. The aortic valve is normal in structure. Aortic valve regurgitation is not visualized. FINDINGS  Left Ventricle: Left ventricular ejection fraction, by estimation, is 60 to 65%. The left ventricle has normal function. The left ventricle has no regional wall motion abnormalities. Definity contrast agent was given IV to delineate the left ventricular  endocardial borders. The left ventricular internal cavity size was normal in size. Left ventricular diastolic parameters are consistent with Grade I diastolic dysfunction (impaired relaxation). Right Ventricle: The right ventricular size is mildly enlarged. Right vetricular wall thickness was not assessed. Right ventricular systolic function is moderately reduced. Left Atrium:  Left atrial size was normal in size. Right Atrium: Right atrial size  was normal in size. Pericardium: There is no evidence of pericardial effusion. Mitral Valve: The mitral valve is normal in structure. Trivial mitral valve regurgitation. Tricuspid Valve: The tricuspid valve is grossly normal. Tricuspid valve regurgitation is trivial. Aortic Valve: The aortic valve is normal in structure. Aortic valve regurgitation is not visualized. Aortic valve mean gradient measures 2.5 mmHg. Aortic valve peak gradient measures 3.7 mmHg. Aortic valve area, by VTI measures 2.92 cm. Pulmonic Valve: The pulmonic valve was not well visualized. Pulmonic valve regurgitation is mild. No evidence of pulmonic stenosis. Aorta: The aortic root is normal in size and structure. IAS/Shunts: No atrial level shunt detected by color flow Doppler.  LEFT VENTRICLE PLAX 2D LVOT diam:     1.90 cm   Diastology LV SV:         51        LV e' medial:    5.75 cm/s LV SV Index:   29        LV E/e' medial:  9.2 LVOT Area:     2.84 cm  LV e' lateral:   8.86 cm/s                          LV E/e' lateral: 6.0  RIGHT VENTRICLE RV S prime:     12.90 cm/s TAPSE (M-mode): 1.7 cm LEFT ATRIUM             Index        RIGHT ATRIUM           Index LA Vol (A2C):   36.6 ml 21.01 ml/m  RA Area:     12.70 cm LA Vol (A4C):   24.3 ml 13.92 ml/m  RA Volume:   25.40 ml  14.58 ml/m LA Biplane Vol: 29.1 ml 16.71 ml/m  AORTIC VALVE AV Area (Vmax):    2.54 cm AV Area (Vmean):   2.47 cm AV Area (VTI):     2.92 cm AV Vmax:           96.75 cm/s AV Vmean:          72.100 cm/s AV VTI:            0.174 m AV Peak Grad:      3.7 mmHg AV Mean Grad:      2.5 mmHg LVOT Vmax:         86.70 cm/s LVOT Vmean:        62.800 cm/s LVOT VTI:          0.179 m LVOT/AV VTI ratio: 1.03  AORTA Ao Root diam: 3.90 cm Ao Asc diam:  3.80 cm MITRAL VALVE MV Area (PHT): 3.62 cm     SHUNTS MV Decel Time: 210 msec     Systemic VTI:  0.18 m MV E velocity: 52.95 cm/s   Systemic Diam: 1.90 cm MV A  velocity: 105.50 cm/s MV E/A ratio:  0.50 Dorris Carnes MD Electronically signed by Dorris Carnes MD Signature Date/Time: 02/02/2022/10:49:42 AM    Final    NM Pulmonary Perfusion  Result Date: 02/01/2022 CLINICAL DATA:  Shortness of breath for 3 weeks. Elevated D-dimer. COPD. EXAM: NUCLEAR MEDICINE PERFUSION LUNG SCAN TECHNIQUE: Perfusion images were obtained in multiple projections after intravenous injection of radiopharmaceutical. Ventilation scans intentionally deferred if perfusion scan and chest x-ray adequate for interpretation during COVID 19 epidemic. RADIOPHARMACEUTICALS:  4 mCi Tc-5mMAA IV COMPARISON:  Portable chest obtained yesterday. FINDINGS: Normal perfusion of both lungs.  No  perfusion defects seen. IMPRESSION: Normal examination.  No evidence of pulmonary embolism. Electronically Signed   By: Claudie Revering M.D.   On: 02/01/2022 11:25    Cardiac Studies   Echo 05/14/2021 1. Left ventricular ejection fraction, by estimation, is 65 to 70%. The  left ventricle has normal function. The left ventricle has no regional  wall motion abnormalities. Left ventricular diastolic parameters are  consistent with Grade I diastolic  dysfunction (impaired relaxation).   2. Right ventricular systolic function is mildly reduced. The right  ventricular size is not well visualized. There is severely elevated  pulmonary artery systolic pressure. The estimated right ventricular  systolic pressure is 59.9 mmHg.   3. The pericardial effusion is anterior to the right ventricle. Moderate  pleural effusion in the left lateral region.   4. The mitral valve is normal in structure. No evidence of mitral valve  regurgitation. No evidence of mitral stenosis.   5. The aortic valve is normal in structure. Aortic valve regurgitation is  not visualized. No aortic stenosis is present.   6. There is borderline dilatation of the ascending aorta, measuring 39  mm.   7. The inferior vena cava is normal in size with  greater than 50%  respiratory variability, suggesting right atrial pressure of 3 mmHg.   Patient Profile     81 y.o. female with PMH of COPD, HTN, HLD, DM II, chronic respiratory failure on 2L [place], lung CA s/p L upper lobectomy 7741, chronic diastolic CHF, PAD (L CEA 4239, R CEA 2014) and anxiety who presented with chest pain  Assessment & Plan    Chest pain  - reported continuous chest pain for 4 weeks, worse with movement. No significant EKG changes, troponin negative. Felt to be related to URI or COPD exacerbation. Low suspicion for ischemic heart disease. Not ideal candidate for invasive work up given Cr 2. VQ scan normal. Echo shows normal EF.  May consider outpatient stress test on follow up if continues to have chest pain once COPD treated (though COPD would need to be better controlled before considering Myoview), no further inpatient cardiac work-up recommended  Chronic diastolic CHF/pulmonary HTN: previous echo in Jan 2023 showed elevated RVSP of 78, likely related to pulmonary issue.  Chronic hypoxic respiratory failure  CAD: mild to moderate nonobstructive CAD noted on previous cath 11/20/2018  PAD HTN HLD DM II     [facility] will sign off.   Medication Recommendations:  No changes Other recommendations (labs, testing, etc):  None Follow up as an outpatient:  Scheduled for 03/07/22   For questions or updates, please contact [facility] Please consult [email or web address] for contact info under        Signed, Donato Heinz, MD  02/03/2022, 8:42 AM

## 2022-02-03 NOTE — Progress Notes (Signed)
Daily Progress Note Intern Pager: [phone]  Patient name: Caitlin Oliver Medical record number: [id number] Date of birth: 1940-11-24 Age: 81 y.o. Gender: female  Primary Care Provider: [name], PA-C Consultants: Cardiology Code Status: DNR   Pt Overview and Major Events to Date:  10/18-admitted   Assessment and Plan:   BRENEE GAJDA is a 81 y.o. female presenting with exertional chest pain and shortness of breath.  Management as below.  Pertinent PMH includes COPD, PAD, HTN, chronic respiratory failure, lung cancer status post L upper lobectomy 2017, CHF.  * Acute on chronic hypoxic respiratory failure (HCC) Thought to be related to parenchymal lung disease versus COPD versus less likely pneumonia.  S/p left upper lobe lobectomy for lung cancer.  Patient also with a history of chronic diastolic heart failure and pulmonary hypertension.  Euvolemic on exam. Echo with EF 60-65% (previous in 04/2021 65-70%), G1DD (stable), RV enlargement, trivial MV regurg (new). New CXR with bibasilar atelectasis. VBG yesterday with pH 7.43, pCO2 48, and bicarb 31.9. Overall, she is stable and can likely continue workup outpatient. -Appreciate cardiology recs, likely no further workup given mild decline in function -Appreciate pulmonology recs -Continue cardiac monitoring, pulse ox -As needed albuterol nebulizer -Supplemental O2 to maintain sats 88-92%. Goal 2L via [place], wean as tolerated -PT/OT -Optimize inhaler regimen prior to discharge  - will need LABA + LAMA  Abdominal discomfort Endorses nausea and constipation over admission. Abdominal exam reassuring. -IV compazine x1 -scheduled miralax, senna  Chest pain Patient presented with exertional/pleuritic chest pain.  No evidence of ischemia on EKG and troponins normal/flat; low suspicion for ACS.  Pain is occasionally reproducible with palpation of chest wall, suspect component of costochondritis. -Cardiology consulted, recommend  outpatient follow-up and consideration of stress testing at that time -As needed Tylenol for pain control -Continue Plavix   Headache Pt reports chronic occipital headache/migraines refractory to several outpatient treatments. No acute neurologic deficits or acute worsening of her symptoms. Responded well to compazine. -Continue Tylenol for pain. Pt not a good candidate for triptan -Consider migraine ppx outpatient -May consider compazine vs reglan if any worsening pain here  Essential hypertension Patient presented with soft blood pressures but have since normalized.  Home medications include bisoprolol and torsemide. Cardiology recommended continuation of home medcations.  - Vitals per routine - Continue home bisoprolol - Torsemide 38m daily instead of BID given elevated Cr - Monitor BP closely  - Consider orthostatic vitals   Acute Kidney Injury (AKI) on CKD 3B Improved mildly this morning 2.05>1.99. -BMP at follow up, continue PO intake -Hold torsemide and restart after discharge  FEN/GI: Heart healthy carb modified diet PPx: Lovenox Dispo:Home with HHPT/OT either today or tomorrow. Barriers include transport home.  Subjective:  Doing well this morning and feeling much better than before.  Objective: Temp:  [98 F (36.7 C)-98.9 F (37.2 C)] 98 F (36.7 C) (10/21 0429) Pulse Rate:  [88-98] 88 (10/21 0429) Resp:  [15] 15 (10/20 1600) BP: (100-157)/(58-88) 126/67 (10/21 0429) SpO2:  [95 %-100 %] 97 % (10/21 0710) Weight:  [68.7 kg] 68.7 kg (10/21 0429) Physical Exam: General: Alert and oriented, in NAD Skin: Warm, dry HEENT: NCAT, EOM grossly normal, midline nasal septum Cardiac: RRR, no m/r/g appreciated Respiratory: CTAB, breathing and speaking comfortably on 3L [place] Extremities: Moves all extremities grossly equally in bed Neurological: No gross focal deficit Psychiatric: Appropriate mood and affect   Laboratory: Most recent CBC Lab Results  Component Value  Date  WBC 6.7 02/03/2022   HGB 11.0 (L) 02/03/2022   HCT 33.5 (L) 02/03/2022   MCV 87.0 02/03/2022   PLT 337 02/03/2022   Most recent BMP    Latest Ref Rng & Units 02/03/2022   12:54 AM  BMP  Glucose 70 - 99 mg/dL 277   BUN 8 - 23 mg/dL 42   Creatinine 0.44 - 1.00 mg/dL 1.99   Sodium 135 - 145 mmol/L 132   Potassium 3.5 - 5.1 mmol/L 4.0   Chloride 98 - 111 mmol/L 95   CO2 22 - 32 mmol/L 25   Calcium 8.9 - 10.3 mg/dL 9.2    Ethelene Hal, MD 02/03/2022, 8:25 AM PGY-1, [facility] Intern pager: [phone], text pages welcome Secure chat group [place]

## 2022-02-06 ENCOUNTER — Telehealth: Payer: Self-pay | Admitting: Podiatry

## 2022-02-06 NOTE — Telephone Encounter (Signed)
LVM for pt to call back for an appt for DM SHOE PICK UP

## 2022-02-13 ENCOUNTER — Inpatient Hospital Stay: Payer: PPO | Admitting: Adult Health

## 2022-02-14 ENCOUNTER — Ambulatory Visit (INDEPENDENT_AMBULATORY_CARE_PROVIDER_SITE_OTHER): Payer: PPO

## 2022-02-14 DIAGNOSIS — E1142 Type 2 diabetes mellitus with diabetic polyneuropathy: Secondary | ICD-10-CM

## 2022-02-14 NOTE — Progress Notes (Signed)

## 2022-02-19 ENCOUNTER — Encounter: Payer: Self-pay | Admitting: Adult Health

## 2022-02-19 ENCOUNTER — Ambulatory Visit: Payer: PPO | Admitting: Adult Health

## 2022-02-19 VITALS — BP 124/70 | HR 75 | Ht 64.0 in | Wt 155.2 lb

## 2022-02-19 DIAGNOSIS — J441 Chronic obstructive pulmonary disease with (acute) exacerbation: Secondary | ICD-10-CM | POA: Insufficient documentation

## 2022-02-19 DIAGNOSIS — I272 Pulmonary hypertension, unspecified: Secondary | ICD-10-CM | POA: Diagnosis not present

## 2022-02-19 DIAGNOSIS — R911 Solitary pulmonary nodule: Secondary | ICD-10-CM

## 2022-02-19 DIAGNOSIS — J9611 Chronic respiratory failure with hypoxia: Secondary | ICD-10-CM

## 2022-02-19 DIAGNOSIS — E43 Unspecified severe protein-calorie malnutrition: Secondary | ICD-10-CM

## 2022-02-19 NOTE — Assessment & Plan Note (Addendum)
Resolving flare with recent hospitalization -now improving  Finish steroids as directed.  Continue on BREO/Incruse, activity, albuterol As needed  .   Plan  Patient Instructions  Finish Prednisone taper.  Continue on Breo 1 puff daily, rinse after use Continue on Incruse 1 puff daily Albuterol inhaler or nebulizer as needed Activity as tolerated Follow up in 6-8 weeks with Dr. Shearon Stalls or Eugene Zeiders NP and As needed   Please contact office for sooner follow up if symptoms do not improve or worsen or seek emergency care

## 2022-02-19 NOTE — Assessment & Plan Note (Signed)
Hx of lung cancer , CT chest with 50m RLL nodule , recommend serial follow up  Patient declines. Education given . Verbalizes understanding .

## 2022-02-19 NOTE — Assessment & Plan Note (Signed)
Compensated on O2 to keep O2 sats >88-90%.   Plan  Patient Instructions  Finish Prednisone taper.  Continue on Breo 1 puff daily, rinse after use Continue on Incruse 1 puff daily Albuterol inhaler or nebulizer as needed Activity as tolerated Follow up in 6-8 weeks with Dr. Shearon Stalls or Krysten Veronica NP and As needed   Please contact office for sooner follow up if symptoms do not improve or worsen or seek emergency care

## 2022-02-19 NOTE — Progress Notes (Signed)
_0  ID: Caitlin Oliver, female    DOB: [birth year], 81 y.o.   MRN: [id number]  Chief Complaint  Patient presents with   Follow-up    Referring provider: Amie Critchley  HPI: 81 year old female seen for pulmonary consult December 2022 to reestablish for COPD and chronic respiratory failure on home oxygen Medical history significant for group 2-3 secondary pulmonary hypertension, lung cancer status post right upper lobe lobectomy in 2017 with no chemo or radiation Former patient of Dr. Melvyn Novas  .   TEST/EVENTS :  2D echo February 02, 2022 EF 60 to 78%, grade 1 diastolic dysfunction, right ventricular systolic function moderately reduced and right ventricle size mildly enlarged  VQ scan February 01, 2022 normal, no evidence of PE.  CT chest June 18, 2018 negative for PE, bilateral pleural effusions, cardiomegaly, 6 mm right lower lobe nodule, hepatic steatosis  02/19/2022 [facility] follow up : COPD , O2RF  Patient presents for a posthospital follow-up.  Patient was admitted last month with a COPD exacerbation.  She was treated with empiric antibiotics, steroids and nebulized bronchodilators.  Patient was seen last year in the clinic December 2022 for pulmonary consult to establish for COPD and chronic respiratory failure on home oxygen.  She also has group 2-3 secondary pulmonary hypertension.  And previous history of lung cancer.  Unfortunately patient did not return for follow-up or PFTs as previously recommended.  Since recent hospitalization patient says she is feeling better. Has 2 days left on prednisone .  2D echo showed preserved EF and grade 1 diastolic dysfunction.  VQ scan was negative.  She was started on Breo and Incruse at discharge.. does complain she is med gap/doughnut hole , Inhalers costing ~$100 each. Can afford , should improve after the first of year. Typically uses albuterol neb .Twice daily   Gets winded easily .  Remains on Oxygen 2.5l/m . No increased  Oxygen demands. Has small e tank .  Lives at home alone. Use rolling walker . Does not drive . Has caregiver that comes 3 days a week to help with driving and shopping . Able to cook light meals and light chores.  We discussed CT chest with history of lung cancer and previous lung nodule noted CT chest 06/2018 . She declines further CT imaging. Patient education given, she understands but says she does not wish to undergo.      Allergies  Allergen Reactions   Fentanyl Shortness Of Breath    Immunization History  Administered Date(s) Administered   Fluad Quad(high Dose 65+) 02/07/2022   Influenza, High Dose Seasonal PF 01/17/2016, 01/28/2017, 01/16/2018, 01/30/2019, 03/08/2020, 01/09/2021   Influenza-Unspecified 01/31/2016   PFIZER Comirnaty(Gray Top)Covid-19 Tri-Sucrose Vaccine 10/12/2020   PFIZER(Purple Top)SARS-COV-2 Vaccination 10/08/2019, 10/28/2019   Pfizer Covid-19 Vaccine Bivalent Booster 22yr & up 03/30/2021   Pneumococcal Conjugate-13 02/09/2015   Pneumococcal Polysaccharide-23 05/08/2013   Tdap 05/05/2021   Zoster Recombinat (Shingrix) 10/21/2017   Zoster, Live 05/14/2011    Past Medical History:  Diagnosis Date   Anxiety    Arthritis    Cancer ([place]    lung cancer   Carotid artery disease (HCC)    Cataracts, bilateral    CHF (congestive heart failure) (HCC)    Claudication (HCC)    COPD (chronic obstructive pulmonary disease) ([place]    Diabetes mellitus    Dizzy    Family history of adverse reaction to anesthesia    Pt nephew has PONV   GERD (gastroesophageal reflux disease)  Headache    History of kidney stones    Hypercholesteremia    Hypertension    Peripheral arterial disease (HCC)    bilateral iliac artery stenosis by angiography   Pneumonia    Stomach ulcer    Stroke ([place])    Tobacco abuse    Wears glasses     Tobacco History: Social History   Tobacco Use  Smoking Status Former   Packs/day: 1.50   Years: 56.00   Total pack years: 84.00    Types: Cigarettes   Quit date: 07/07/2013   Years since quitting: 8.6  Smokeless Tobacco Former   Quit date: 02/25/2013   Counseling given: Not Answered   Outpatient Medications Prior to Visit  Medication Sig Dispense Refill   acetaminophen (TYLENOL) 500 MG tablet Take 500 mg by mouth every 6 (six) hours as needed for moderate pain.     albuterol (PROVENTIL HFA;VENTOLIN HFA) 108 (90 Base) MCG/ACT inhaler Inhale 2 puffs into the lungs every 6 (six) hours as needed for wheezing or shortness of breath.      albuterol (PROVENTIL) (2.5 MG/3ML) 0.083% nebulizer solution Take 2.5 mg by nebulization in the morning and at bedtime.     Ascorbic Acid (VITAMIN C) 1000 MG tablet Take 1,000 mg by mouth daily.     atorvastatin (LIPITOR) 40 MG tablet Take 1 tablet (40 mg total) by mouth daily.     bisoprolol (ZEBETA) 5 MG tablet Take 0.5 tablets (2.5 mg total) by mouth daily. 45 tablet 3   carboxymethylcellul-glycerin (REFRESH OPTIVE) 0.5-0.9 % ophthalmic solution 1 drop as needed for dry eyes.     Cholecalciferol (VITAMIN D3 PO) Take 50 mcg by mouth daily. 1 capsules daily     clopidogrel (PLAVIX) 75 MG tablet Take 75 mg by mouth daily.     cyclobenzaprine (FLEXERIL) 5 MG tablet Take 5 mg by mouth as needed.     docusate sodium (COLACE) 100 MG capsule Take 100 mg by mouth 2 (two) times daily as needed for mild constipation.     escitalopram (LEXAPRO) 10 MG tablet Take 10 mg by mouth daily.     ferrous sulfate 325 (65 FE) MG tablet Take 325 mg by mouth daily with breakfast.     fluticasone furoate-vilanterol (BREO ELLIPTA) 200-25 MCG/ACT AEPB Inhale 1 puff into the lungs daily. 28 each 0   Magnesium 250 MG TABS Take 250 mg by mouth daily.     meclizine (ANTIVERT) 25 MG tablet Take 25 mg by mouth daily as needed.     Multiple Vitamin (MULTIVITAMIN WITH MINERALS) TABS tablet Take 1 tablet by mouth daily.     naphazoline-glycerin (CLEAR EYES REDNESS) 0.012-0.25 % SOLN 1-2 drops 4 (four) times daily as  needed for eye irritation.     ondansetron (ZOFRAN) 4 MG tablet Take 4 mg by mouth daily as needed for nausea.     ONE TOUCH ULTRA TEST test strip      OXYGEN Inhale 2.5 L into the lungs.     pantoprazole (PROTONIX) 40 MG tablet Take 40 mg by mouth 2 (two) times daily.     polyethylene glycol (MIRALAX / GLYCOLAX) 17 g packet Take 17 g by mouth daily as needed for mild constipation.     potassium chloride SA (KLOR-CON M) 20 MEQ tablet Take 1 tablet (20 mEq total) by mouth daily. 60 tablet 3   predniSONE (DELTASONE) 10 MG tablet Take 4 tablets (40 mg total) by mouth daily with breakfast for 4 days, THEN 3  tablets (30 mg total) daily with breakfast for 5 days, THEN 2 tablets (20 mg total) daily with breakfast for 5 days, THEN 1 tablet (10 mg total) daily with breakfast for 5 days. 46 tablet 0   senna (SENOKOT) 8.6 MG TABS tablet Take 1 tablet (8.6 mg total) by mouth daily. 120 tablet 0   torsemide (DEMADEX) 20 MG tablet Take 2 tablets (40 mg total) by mouth 2 (two) times daily. Take 2 tablet by mouth 2 times daily. 112 tablet 4   TRADJENTA 5 MG TABS tablet Take 5 mg by mouth daily.     umeclidinium bromide (INCRUSE ELLIPTA) 62.5 MCG/ACT AEPB Inhale 1 puff into the lungs as needed.     No facility-administered medications prior to visit.     Review of Systems:   Constitutional:   No  weight loss, night sweats,  Fevers, chills, fatigue, or  lassitude.  HEENT:   No headaches,  Difficulty swallowing,  Tooth/dental problems, or  Sore throat,                No sneezing, itching, ear ache, nasal congestion, post nasal drip,   CV:  No chest pain,  Orthopnea, PND, swelling in lower extremities, anasarca, dizziness, palpitations, syncope.   GI  No heartburn, indigestion, abdominal pain, nausea, vomiting, diarrhea, change in bowel habits, loss of appetite, bloody stools.   Resp: No shortness of breath with exertion or at rest.  No excess mucus, no productive cough,  No non-productive cough,  No  coughing up of blood.  No change in color of mucus.  No wheezing.  No chest wall deformity  Skin: no rash or lesions.  GU: no dysuria, change in color of urine, no urgency or frequency.  No flank pain, no hematuria   MS:  No joint pain or swelling.  No decreased range of motion.  No back pain.    Physical Exam  BP 124/70 (BP Location: Left Arm, Cuff Size: Normal)   Pulse 75   Ht _0  (1.626 m)   Wt 155 lb 3.2 oz (70.4 kg)   SpO2 94%   BMI 26.64 kg/m   GEN: A/Ox3; pleasant , NAD, Elderly , on O2 , rolling walker    HEENT:  [place]/AT,  NOSE-clear, THROAT-clear, no lesions, no postnasal drip or exudate noted.   NECK:  Supple w/ fair ROM; no JVD; normal carotid impulses w/o bruits; no thyromegaly or nodules palpated; no lymphadenopathy.    RESP  decreased BS in bases,  no accessory muscle use, no dullness to percussion  CARD:  RRR, no m/r/g, no peripheral edema, pulses intact, no cyanosis or clubbing.  GI:   Soft & nt; nml bowel sounds; no organomegaly or masses detected.   Musco: Warm bil, no deformities or joint swelling noted.   Neuro: alert, no focal deficits noted.    Skin: Warm, no lesions or rashes    Lab Results:      BNP   Imaging: DG CHEST PORT 1 VIEW  Result Date: 02/02/2022 CLINICAL DATA:  Hypoxia EXAM: PORTABLE CHEST 1 VIEW COMPARISON:  01/31/22 CXR FINDINGS: No pleural effusion. No pneumothorax. No new focal airspace opacity. Bibasilar atelectasis. Gas distended stomach. Aortic vascular calcifications. Radiographically apparent displaced rib fractures. IMPRESSION: Bibasilar atelectasis Electronically Signed   By: Marin Roberts M.D.   On: 02/02/2022 13:49   ECHOCARDIOGRAM COMPLETE  Result Date: 02/02/2022    ECHOCARDIOGRAM REPORT   Patient Name:   Caitlin Oliver Date of Exam: 02/02/2022 Medical  Rec #:  [id number]      Height:       64.0 in Accession #:    [id number]     Weight:       152.2 lb Date of Birth:  02/17/1941      BSA:          1.742 m Patient  Age:    49 years       BP:           112/60 mmHg Patient Gender: F              HR:           91 bpm. Exam Location:  Inpatient Procedure: 2D Echo, Cardiac Doppler, Color Doppler and Intracardiac            Opacification Agent Indications:    Dyspnea R06.00  History:        Patient has prior history of Echocardiogram examinations, most                 recent 05/14/2021. PAD and COPD, Signs/Symptoms:Chest Pain and                 Shortness of Breath; Risk Factors:Hypertension, Dyslipidemia,                 Current Smoker and Diabetes. Lung Cancer.  Sonographer:    Ronny Flurry Referring Phys: [id number] CARINA M BROWN  Sonographer Comments: Technically difficult study due to poor echo windows, suboptimal parasternal window and suboptimal subcostal window. IMPRESSIONS  1. Left ventricular ejection fraction, by estimation, is 60 to 65%. The left ventricle has normal function. The left ventricle has no regional wall motion abnormalities. Left ventricular diastolic parameters are consistent with Grade I diastolic dysfunction (impaired relaxation).  2. Right ventricular systolic function is moderately reduced. The right ventricular size is mildly enlarged.  3. The mitral valve is normal in structure. Trivial mitral valve regurgitation.  4. The aortic valve is normal in structure. Aortic valve regurgitation is not visualized. FINDINGS  Left Ventricle: Left ventricular ejection fraction, by estimation, is 60 to 65%. The left ventricle has normal function. The left ventricle has no regional wall motion abnormalities. Definity contrast agent was given IV to delineate the left ventricular  endocardial borders. The left ventricular internal cavity size was normal in size. Left ventricular diastolic parameters are consistent with Grade I diastolic dysfunction (impaired relaxation). Right Ventricle: The right ventricular size is mildly enlarged. Right vetricular wall thickness was not assessed. Right ventricular systolic function  is moderately reduced. Left Atrium: Left atrial size was normal in size. Right Atrium: Right atrial size was normal in size. Pericardium: There is no evidence of pericardial effusion. Mitral Valve: The mitral valve is normal in structure. Trivial mitral valve regurgitation. Tricuspid Valve: The tricuspid valve is grossly normal. Tricuspid valve regurgitation is trivial. Aortic Valve: The aortic valve is normal in structure. Aortic valve regurgitation is not visualized. Aortic valve mean gradient measures 2.5 mmHg. Aortic valve peak gradient measures 3.7 mmHg. Aortic valve area, by VTI measures 2.92 cm. Pulmonic Valve: The pulmonic valve was not well visualized. Pulmonic valve regurgitation is mild. No evidence of pulmonic stenosis. Aorta: The aortic root is normal in size and structure. IAS/Shunts: No atrial level shunt detected by color flow Doppler.  LEFT VENTRICLE PLAX 2D LVOT diam:     1.90 cm   Diastology LV SV:         51  LV e' medial:    5.75 cm/s LV SV Index:   29        LV E/e' medial:  9.2 LVOT Area:     2.84 cm  LV e' lateral:   8.86 cm/s                          LV E/e' lateral: 6.0  RIGHT VENTRICLE RV S prime:     12.90 cm/s TAPSE (M-mode): 1.7 cm LEFT ATRIUM             Index        RIGHT ATRIUM           Index LA Vol (A2C):   36.6 ml 21.01 ml/m  RA Area:     12.70 cm LA Vol (A4C):   24.3 ml 13.92 ml/m  RA Volume:   25.40 ml  14.58 ml/m LA Biplane Vol: 29.1 ml 16.71 ml/m  AORTIC VALVE AV Area (Vmax):    2.54 cm AV Area (Vmean):   2.47 cm AV Area (VTI):     2.92 cm AV Vmax:           96.75 cm/s AV Vmean:          72.100 cm/s AV VTI:            0.174 m AV Peak Grad:      3.7 mmHg AV Mean Grad:      2.5 mmHg LVOT Vmax:         86.70 cm/s LVOT Vmean:        62.800 cm/s LVOT VTI:          0.179 m LVOT/AV VTI ratio: 1.03  AORTA Ao Root diam: 3.90 cm Ao Asc diam:  3.80 cm MITRAL VALVE MV Area (PHT): 3.62 cm     SHUNTS MV Decel Time: 210 msec     Systemic VTI:  0.18 m MV E velocity: 52.95  cm/s   Systemic Diam: 1.90 cm MV A velocity: 105.50 cm/s MV E/A ratio:  0.50 Dorris Carnes MD Electronically signed by Dorris Carnes MD Signature Date/Time: 02/02/2022/10:49:42 AM    Final    NM Pulmonary Perfusion  Result Date: 02/01/2022 CLINICAL DATA:  Shortness of breath for 3 weeks. Elevated D-dimer. COPD. EXAM: NUCLEAR MEDICINE PERFUSION LUNG SCAN TECHNIQUE: Perfusion images were obtained in multiple projections after intravenous injection of radiopharmaceutical. Ventilation scans intentionally deferred if perfusion scan and chest x-ray adequate for interpretation during COVID 19 epidemic. RADIOPHARMACEUTICALS:  4 mCi Tc-34mMAA IV COMPARISON:  Portable chest obtained yesterday. FINDINGS: Normal perfusion of both lungs.  No perfusion defects seen. IMPRESSION: Normal examination.  No evidence of pulmonary embolism. Electronically Signed   By: SClaudie ReveringM.D.   On: 02/01/2022 11:25   DG Chest 2 View  Result Date: 01/31/2022 CLINICAL DATA:  Shortness of breath for 3 weeks. EXAM: CHEST - 2 VIEW COMPARISON:  AP chest 05/25/2021, 05/16/2021, CT chest 06/18/2018 FINDINGS: Heart size is mildly enlarged. There are dense calcifications within the aortic arch. Mildly to moderately decreased lung volumes. Mild bilateral chronic interstitial thickening/scarring. Trace left pleural fluid. No pneumothorax. Moderate multilevel degenerative disc changes of the thoracic spine. IMPRESSION: Chronic interstitial thickening/scarring. Chronic small left pleural effusion, appearing unchanged from 05/25/2021 frontal radiograph and 06/19/2018 CT. Electronically Signed   By: RYvonne KendallM.D.   On: 01/31/2022 13:00         Latest Ref Rng & Units 02/19/2019  3:21 PM 12/15/2015   10:48 AM  PFT Results  FVC-Pre L 2.03  2.49   FVC-Predicted Pre % 75  88   FVC-Post L  2.69   FVC-Predicted Post %  95   Pre FEV1/FVC % % 62  71   Post FEV1/FCV % %  72   FEV1-Pre L 1.25  1.77   FEV1-Predicted Pre % 62  84   FEV1-Post L   1.92   DLCO uncorrected ml/min/mmHg 9.75  16.88   DLCO UNC% % 51  69   DLVA Predicted % 63  80   TLC L 5.65  5.76   TLC % Predicted % 111  114   RV % Predicted % 158  144     No results found for: "NITRICOXIDE"      Assessment & Plan:   COPD with acute exacerbation (HCC) Resolving flare with recent hospitalization -now improving  Finish steroids as directed.  Continue on BREO/Incruse, activity, albuterol As needed  .   Plan  Patient Instructions  Finish Prednisone taper.  Continue on Breo 1 puff daily, rinse after use Continue on Incruse 1 puff daily Albuterol inhaler or nebulizer as needed Activity as tolerated Follow up in 6-8 weeks with Dr. Shearon Stalls or Yossi Hinchman NP and As needed   Please contact office for sooner follow up if symptoms do not improve or worsen or seek emergency care         Pulmonary hypertension, unspecified ([place]) Continue with Oxygen, diuretics and COPD management .  Appears euvolemic on exam .   Chronic hypoxemic respiratory failure (HCC) Compensated on O2 to keep O2 sats >88-90%.   Plan  Patient Instructions  Finish Prednisone taper.  Continue on Breo 1 puff daily, rinse after use Continue on Incruse 1 puff daily Albuterol inhaler or nebulizer as needed Activity as tolerated Follow up in 6-8 weeks with Dr. Shearon Stalls or Marteze Vecchio NP and As needed   Please contact office for sooner follow up if symptoms do not improve or worsen or seek emergency care         Solitary pulmonary nodule Hx of lung cancer , CT chest with 50m RLL nodule , recommend serial follow up  Patient declines. Education given . Verbalizes understanding .    Protein-calorie malnutrition, severe Continue with high protein diet   I spent   45 minutes dedicated to the care of this patient on the date of this encounter to include pre-visit review of records, face-to-face time with the patient discussing conditions above, post visit ordering of testing, clinical documentation  with the electronic health record, making appropriate referrals as documented, and communicating necessary findings to members of the patients care team.    TRexene Edison NP 02/19/2022

## 2022-02-19 NOTE — Assessment & Plan Note (Signed)
Continue with high-protein diet. 

## 2022-02-19 NOTE — Assessment & Plan Note (Signed)
Continue with Oxygen, diuretics and COPD management .  Appears euvolemic on exam .

## 2022-02-19 NOTE — Patient Instructions (Addendum)
Finish Prednisone taper.  Continue on Breo 1 puff daily, rinse after use Continue on Incruse 1 puff daily Albuterol inhaler or nebulizer as needed Activity as tolerated Follow up in 6-8 weeks with Dr. Shearon Stalls or Gayl Ivanoff NP and As needed   Please contact office for sooner follow up if symptoms do not improve or worsen or seek emergency care

## 2022-03-07 ENCOUNTER — Ambulatory Visit: Payer: PPO | Attending: Nurse Practitioner | Admitting: Nurse Practitioner

## 2022-03-07 ENCOUNTER — Other Ambulatory Visit: Payer: Self-pay | Admitting: Family Medicine

## 2022-03-07 ENCOUNTER — Encounter: Payer: Self-pay | Admitting: Nurse Practitioner

## 2022-03-07 VITALS — BP 110/67 | HR 71 | Ht 64.0 in | Wt 159.0 lb

## 2022-03-07 DIAGNOSIS — I6523 Occlusion and stenosis of bilateral carotid arteries: Secondary | ICD-10-CM

## 2022-03-07 DIAGNOSIS — E1169 Type 2 diabetes mellitus with other specified complication: Secondary | ICD-10-CM

## 2022-03-07 DIAGNOSIS — J9611 Chronic respiratory failure with hypoxia: Secondary | ICD-10-CM

## 2022-03-07 DIAGNOSIS — I10 Essential (primary) hypertension: Secondary | ICD-10-CM | POA: Diagnosis not present

## 2022-03-07 DIAGNOSIS — N1832 Chronic kidney disease, stage 3b: Secondary | ICD-10-CM

## 2022-03-07 DIAGNOSIS — R079 Chest pain, unspecified: Secondary | ICD-10-CM

## 2022-03-07 DIAGNOSIS — I5032 Chronic diastolic (congestive) heart failure: Secondary | ICD-10-CM | POA: Diagnosis not present

## 2022-03-07 DIAGNOSIS — I739 Peripheral vascular disease, unspecified: Secondary | ICD-10-CM

## 2022-03-07 DIAGNOSIS — I272 Pulmonary hypertension, unspecified: Secondary | ICD-10-CM | POA: Diagnosis not present

## 2022-03-07 DIAGNOSIS — E782 Mixed hyperlipidemia: Secondary | ICD-10-CM

## 2022-03-07 NOTE — Patient Instructions (Signed)
Medication Instructions:  Your physician recommends that you continue on your current medications as directed. Please refer to the Current Medication list given to you today.   *If you need a refill on your cardiac medications before your next appointment, please call your pharmacy*   Lab Work: NONE ordered at this time of appointment   If you have labs (blood work) drawn today and your tests are completely normal, you will receive your results only by: [place] (if you have MyChart) OR A paper copy in the mail If you have any lab test that is abnormal or we need to change your treatment, we will call you to review the results.   Testing/Procedures: NONE ordered at this time of appointment    Follow-Up: At [facility], you and your health needs are our priority.  As part of our continuing mission to provide you with exceptional heart care, we have created designated Provider Care Teams.  These Care Teams include your primary Cardiologist (physician) and Advanced Practice Providers (APPs -  Physician Assistants and Nurse Practitioners) who all work together to provide you with the care you need, when you need it.  We recommend signing up for the patient portal called "MyChart".  Sign up information is provided on this After Visit Summary.  MyChart is used to connect with patients for Virtual Visits (Telemedicine).  Patients are able to view lab/test results, encounter notes, upcoming appointments, etc.  Non-urgent messages can be sent to your provider as well.   To learn more about what you can do with MyChart, go to [email or web address].    Your next appointment:   4-6 month(s)  The format for your next appointment:   In Person  Provider:   Quay Burow, MD     Other Instructions   Important Information About Sugar

## 2022-03-07 NOTE — Progress Notes (Signed)
Office Visit    Patient Name: Caitlin Oliver Date of Encounter: 03/07/2022  Primary Care Provider:  [name], PA-C Primary Cardiologist:  Quay Burow, MD  Chief Complaint    81 year old female with a history of chronic diastolic heart failure, chest pain, CVA, carotid artery stenosis s/p R CEA in 2014, L CEA in 2015, hypertension, hyperlipidemia, mixed pulmonary hypertension, chronic respiratory failure on 2 L O2, lung cancer s/p left upper lobectomy in 2017, COPD, PAD, chronic dizziness, suspected OSA, type 2 diabetes, CKD, GERD, and anxiety who presents for hospital follow-up related to chest pain.  Past Medical History    Past Medical History:  Diagnosis Date   Anxiety    Arthritis    Cancer ([place])    lung cancer   Carotid artery disease (HCC)    Cataracts, bilateral    CHF (congestive heart failure) (HCC)    Claudication (HCC)    COPD (chronic obstructive pulmonary disease) ([place])    Diabetes mellitus    Dizzy    Family history of adverse reaction to anesthesia    Pt nephew has PONV   GERD (gastroesophageal reflux disease)    Headache    History of kidney stones    Hypercholesteremia    Hypertension    Peripheral arterial disease (HCC)    bilateral iliac artery stenosis by angiography   Pneumonia    Stomach ulcer    Stroke ([place])    Tobacco abuse    Wears glasses    Past Surgical History:  Procedure Laterality Date   ABDOMINAL AORTIC ENDOVASCULAR STENT GRAFT Right 06/19/2019   Procedure: INNOMINATE STENT, RIGHT CAROTID STENT, RIGHT SUBCLAVIAN STENT AND CAROTID CUTDOWN;  Surgeon: Marty Heck, MD;  Location: [place];  Service: Vascular;  Laterality: Right;   ABDOMINAL HYSTERECTOMY     APPENDECTOMY     BREAST REDUCTION SURGERY     CAROTID ANGIOGRAM N/A 02/25/2013   Procedure: CAROTID ANGIOGRAM;  Surgeon: Lorretta Harp, MD;  Location: [facility] CATH LAB;  Service: Cardiovascular;  Laterality: N/A;   ENDARTERECTOMY Right 03/06/2013   Procedure:  ENDARTERECTOMY CAROTID-RIGHT;  Surgeon: Serafina Mitchell, MD;  Location: [place];  Service: Vascular;  Laterality: Right;   ENDARTERECTOMY Left 05/07/2013   Procedure: LEFT CAROTID ARTERY ENDARTERECTOMY WITH VASCU-GUARD PATCH ANGIOPLASTY ;  Surgeon: Serafina Mitchell, MD;  Location: [place];  Service: Vascular;  Laterality: Left;   INTRAOPERATIVE ARTERIOGRAM N/A 06/19/2019   Procedure: Clydie Braun;  Surgeon: Marty Heck, MD;  Location: [place];  Service: Vascular;  Laterality: N/A;   IR ANGIO INTRA EXTRACRAN SEL COM CAROTID INNOMINATE UNI R MOD SED  05/14/2019   IR ANGIO VERTEBRAL SEL SUBCLAVIAN INNOMINATE BILAT MOD SED  05/14/2019   LOBECTOMY Left 02/03/2016   Procedure: LEFT UPPER LOBECTOMY;  Surgeon: [name], MD;  Location: [place];  Service: Thoracic;  Laterality: Left;   LOWER EXTREMITY ANGIOGRAM N/A 02/25/2013   Procedure: LOWER EXTREMITY ANGIOGRAM;  Surgeon: Lorretta Harp, MD;  Location: [facility] CATH LAB;  Service: Cardiovascular;  Laterality: N/A;   LOWER EXTREMITY ANGIOGRAM N/A 07/27/2013   Procedure: LOWER EXTREMITY ANGIOGRAM;  Surgeon: Lorretta Harp, MD;  Location: [facility] CATH LAB;  Service: Cardiovascular;  Laterality: N/A;   PATCH ANGIOPLASTY Right 03/06/2013   Procedure: PATCH ANGIOPLASTY of Right Carotid Artery using Vascu-Guard Patch;  Surgeon: Serafina Mitchell, MD;  Location: [place];  Service: Vascular;  Laterality: Right;   PERIPHERAL VASCULAR CATHETERIZATION N/A 11/15/2015   Procedure: Aortic Arch  Angiography;  Surgeon: Serafina Mitchell, MD;  Location: [place] CV LAB;  Service: Cardiovascular;  Laterality: N/A;   PERIPHERAL VASCULAR CATHETERIZATION Bilateral 11/15/2015   Procedure: Carotid Angiography;  Surgeon: Serafina Mitchell, MD;  Location: [place] CV LAB;  Service: Cardiovascular;  Laterality: Bilateral;   PERIPHERAL VASCULAR CATHETERIZATION Left 11/15/2015   Procedure: Upper Extremity Angiography;  Surgeon: Serafina Mitchell, MD;  Location: [place] CV LAB;  Service:  Cardiovascular;  Laterality: Left;   PERIPHERAL VASCULAR CATHETERIZATION Left 11/15/2015   Procedure: Peripheral Vascular Intervention;  Surgeon: Serafina Mitchell, MD;  Location: [place] CV LAB;  Service: Cardiovascular;  Laterality: Left;  subclavian    RIGHT HEART CATH N/A 06/16/2019   Procedure: RIGHT HEART CATH;  Surgeon: Jolaine Artist, MD;  Location: [place] CV LAB;  Service: Cardiovascular;  Laterality: N/A;   RIGHT/LEFT HEART CATH AND CORONARY ANGIOGRAPHY N/A 11/20/2018   Procedure: RIGHT/LEFT HEART CATH AND CORONARY ANGIOGRAPHY;  Surgeon: Nelva Bush, MD;  Location: [place] CV LAB;  Service: Cardiovascular;  Laterality: N/A;   SALIVARY GLAND SURGERY     scar tissue removed from left saliva glad   TIBIA IM NAIL INSERTION Right 05/23/2021   Procedure: INTRAMEDULLARY (IM) NAIL TIBIAL;  Surgeon: Altamese Randsburg, MD;  Location: [place];  Service: Orthopedics;  Laterality: Right;   TUBAL LIGATION     ULTRASOUND GUIDANCE FOR VASCULAR ACCESS Right 06/19/2019   Procedure: Ultrasound Guidance For Vascular Access;  Surgeon: Marty Heck, MD;  Location: [place];  Service: Vascular;  Laterality: Right;   VIDEO ASSISTED THORACOSCOPY (VATS)/WEDGE RESECTION Left 02/03/2016   Procedure: VIDEO ASSISTED THORACOSCOPY;  Surgeon: [name], MD;  Location: [place];  Service: Thoracic;  Laterality: Left;    Allergies  Allergies  Allergen Reactions   Fentanyl Shortness Of Breath    History of Present Illness    81 year old female with the above past medical history including chronic diastolic heart failure, chest pain, CVA, carotid artery stenosis s/p R CEA in 2014, L CEA in 2015, hypertension, hyperlipidemia, mixed pulmonary hypertension, chronic respiratory failure on 2 L O2, lung cancer s/p left upper lobectomy in 2017, COPD, PAD, chronic dizziness, suspected OSA, type 2 diabetes, CKD, GERD, and anxiety.  R/LHC in 11/2018 revealed mild to moderate nonobstructive CAD, severe  pulmonary hypertension, moderate to severely elevated left and right heart filling pressures.  She has a history of carotid artery stenosis s/p bilateral carotid endarterectomy, innominate artery stenting as well as subclavian artery stenting (follows with Dr. Vascular surgery) as well as iliac artery stenting per Dr. Gwenlyn Found.  She follows with Dr. Haroldine Laws in the advanced heart failure clinic for pulmonary hypertension.  She last saw Dr. Gwenlyn Found in 09/2021 and was stable from a cardiac standpoint.  He was seen in the AHF clinic on 01/12/2022 and was doing well overall.  She was hospitalized in October 2023 in the setting of acute on chronic hypoxic respiratory failure, COPD exacerbation.  She was noted to have chest pain.  EKG was unremarkable, troponins were negative. Echocardiogram showed 60 to 65%, normal LV function, no R WMA, G1 DD, moderate reduced RV systolic function, mildly enlarged RV, no significant valvular abnormalities.  Cardiology was consulted and recommended outpatient follow-up and consideration of outpatient stress testing following improvement of COPD exacerbation.  Coronary CTA/catheterization were deferred in the setting of CKD stage IIIb with AKI.  She was discharged home in stable condition on 02/03/2022.  She presents today for follow-up.  Since her  hospitalization been stable overall from a cardiac standpoint.  Her weight is up slightly since her hospitalization.  She denies worsening dyspnea, edema, PND, orthopnea.  She denies chest pain.  Overall, she reports feeling well.  Home Medications    Current Outpatient Medications  Medication Sig Dispense Refill   acetaminophen (TYLENOL) 500 MG tablet Take 500 mg by mouth every 6 (six) hours as needed for moderate pain.     albuterol (PROVENTIL HFA;VENTOLIN HFA) 108 (90 Base) MCG/ACT inhaler Inhale 2 puffs into the lungs every 6 (six) hours as needed for wheezing or shortness of breath.      albuterol (PROVENTIL) (2.5 MG/3ML) 0.083%  nebulizer solution Take 2.5 mg by nebulization in the morning and at bedtime.     Ascorbic Acid (VITAMIN C) 1000 MG tablet Take 1,000 mg by mouth daily.     atorvastatin (LIPITOR) 40 MG tablet Take 1 tablet (40 mg total) by mouth daily.     bisoprolol (ZEBETA) 5 MG tablet Take 0.5 tablets (2.5 mg total) by mouth daily. 45 tablet 3   carboxymethylcellul-glycerin (REFRESH OPTIVE) 0.5-0.9 % ophthalmic solution 1 drop as needed for dry eyes.     Cholecalciferol (VITAMIN D3 PO) Take 50 mcg by mouth daily. 1 capsules daily     clopidogrel (PLAVIX) 75 MG tablet Take 75 mg by mouth daily.     cyclobenzaprine (FLEXERIL) 5 MG tablet Take 5 mg by mouth as needed.     docusate sodium (COLACE) 100 MG capsule Take 100 mg by mouth 2 (two) times daily as needed for mild constipation.     escitalopram (LEXAPRO) 10 MG tablet Take 10 mg by mouth daily.     ferrous sulfate 325 (65 FE) MG tablet Take 325 mg by mouth daily with breakfast.     fluticasone furoate-vilanterol (BREO ELLIPTA) 200-25 MCG/ACT AEPB Inhale 1 puff into the lungs daily. 28 each 0   Magnesium 250 MG TABS Take 250 mg by mouth daily.     meclizine (ANTIVERT) 25 MG tablet Take 25 mg by mouth daily as needed.     Multiple Vitamin (MULTIVITAMIN WITH MINERALS) TABS tablet Take 1 tablet by mouth daily.     naphazoline-glycerin (CLEAR EYES REDNESS) 0.012-0.25 % SOLN 1-2 drops 4 (four) times daily as needed for eye irritation.     ondansetron (ZOFRAN) 4 MG tablet Take 4 mg by mouth daily as needed for nausea.     ONE TOUCH ULTRA TEST test strip      OXYGEN Inhale 2.5 L into the lungs.     pantoprazole (PROTONIX) 40 MG tablet Take 40 mg by mouth 2 (two) times daily.     polyethylene glycol (MIRALAX / GLYCOLAX) 17 g packet Take 17 g by mouth daily as needed for mild constipation.     potassium chloride SA (KLOR-CON M) 20 MEQ tablet Take 1 tablet (20 mEq total) by mouth daily. 60 tablet 3   senna (SENOKOT) 8.6 MG TABS tablet Take 1 tablet (8.6 mg total) by  mouth daily. 120 tablet 0   torsemide (DEMADEX) 20 MG tablet Take 2 tablets (40 mg total) by mouth 2 (two) times daily. Take 2 tablet by mouth 2 times daily. 112 tablet 4   TRADJENTA 5 MG TABS tablet Take 5 mg by mouth daily.     umeclidinium bromide (INCRUSE ELLIPTA) 62.5 MCG/ACT AEPB Inhale 1 puff into the lungs as needed.     No current facility-administered medications for this visit.     Review of Systems  She denies chest pain, palpitations, worsening dyspnea, pnd, orthopnea, n, v, dizziness, syncope, edema, weight gain, or early satiety. All other systems reviewed and are otherwise negative except as noted above.   Physical Exam    VS:  BP 110/67   Pulse 71   Ht _0  (1.626 m)   Wt 159 lb (72.1 kg)   SpO2 90%   BMI 27.29 kg/m  GEN: Well nourished, well developed, in no acute distress. HEENT: normal. Neck: Supple, no JVD, carotid bruits, or masses. Cardiac: RRR, no murmurs, rubs, or gallops. No clubbing, cyanosis, edema.  Radials/DP/PT 2+ and equal bilaterally.  Respiratory:  Respirations regular and unlabored, clear to auscultation bilaterally. GI: Soft, nontender, nondistended, BS + x 4. MS: no deformity or atrophy. Skin: warm and dry, no rash. Neuro:  Strength and sensation are intact. Psych: Normal affect.  Accessory Clinical Findings    ECG personally reviewed by me today -NSR, 73 bpm- no acute changes.   Lab Results  Component Value Date   WBC 6.7 02/03/2022   HGB 11.0 (L) 02/03/2022   HCT 33.5 (L) 02/03/2022   MCV 87.0 02/03/2022   PLT 337 02/03/2022   Lab Results  Component Value Date   CREATININE 1.99 (H) 02/03/2022   BUN 42 (H) 02/03/2022   NA 132 (L) 02/03/2022   K 4.0 02/03/2022   CL 95 (L) 02/03/2022   CO2 25 02/03/2022   Lab Results  Component Value Date   ALT 16 05/12/2021   AST 20 05/12/2021   ALKPHOS 58 05/12/2021   BILITOT 0.4 05/12/2021   Lab Results  Component Value Date   CHOL 135 05/17/2021   HDL 31 (L) 05/17/2021    LDLCALC 67 05/17/2021   TRIG 185 (H) 05/17/2021   CHOLHDL 4.4 05/17/2021    Lab Results  Component Value Date   HGBA1C 6.6 (H) 05/17/2021    Assessment & Plan    1. Chest pain: [facility] 11/22/2018 revealed mild to moderate nonobstructive CAD.  She was hospitalized in October 2023 in the setting of COPD exacerbation.  She did note chest pain.  EKG was unremarkable, troponins were negative.  Echo was stable.  Consideration for outpatient stress test was discussed, however, patient denies any further chest pain, and declines further testing at this time.  Continue to monitor symptoms. Discussed ED precautions.  Continue Plavix, Lipitor.  2. Chronic diastolic heart failure: Echo in showed 60 to 65%, normal LV function, no R WMA, G1 DD, moderate reduced RV systolic function, mildly enlarged RV, no significant valvular abnormalities.  Weight is up slightly since her hospital discharge, however, she denies worsening dyspnea, edema, PND, orthopnea.  Continue daily weights.  Continue torsemide.  3. Pulmonary hypertension: Severe on RHC in 11/2018.  Follows with Dr. Haroldine Laws.  4. Hypertension: BP well controlled. Continue current antihypertensive regimen.   5. Hyperlipidemia: LDL was 67 in 05/2021.  Continue Lipitor.  6. Chronic respiratory failure/COPD: Follows with pulmonology.  Home O2 dependent.  She denies worsening dyspnea.  7. PAD/carotid artery stenosis: S/p bilateral carotid endarterectomy, innominate artery stenting as well as subclavian artery stenting (follows with Dr. Vascular surgery) as well as iliac artery stenting per Dr. Gwenlyn Found.  She denies worsening claudication.  8. Type 2 diabetes: A1c was 6.6 in 05/2021.  Monitored and managed per PCP.  9. CKD stage IIIb: Creatinine was 1.9 in 01/2022.  10. Disposition:F/u in 4-6 months with Dr. Gwenlyn Found.      Lenna Sciara, NP 03/07/2022

## 2022-04-19 ENCOUNTER — Encounter: Payer: Self-pay | Admitting: Adult Health

## 2022-04-19 ENCOUNTER — Ambulatory Visit (INDEPENDENT_AMBULATORY_CARE_PROVIDER_SITE_OTHER): Payer: PPO | Admitting: Adult Health

## 2022-04-19 VITALS — BP 108/50 | HR 76 | Temp 97.6°F | Ht 64.0 in | Wt 161.2 lb

## 2022-04-19 DIAGNOSIS — J9611 Chronic respiratory failure with hypoxia: Secondary | ICD-10-CM | POA: Diagnosis not present

## 2022-04-19 DIAGNOSIS — I272 Pulmonary hypertension, unspecified: Secondary | ICD-10-CM

## 2022-04-19 DIAGNOSIS — J411 Mucopurulent chronic bronchitis: Secondary | ICD-10-CM

## 2022-04-19 NOTE — Assessment & Plan Note (Addendum)
Moderate airflow obstruction on previous PFTs in 2020.-Patient is to continue on current triple therapy maintenance regimen. Vaccines are up-to-date Exacerbation fall 2023 with hospitalization - resolved and back to baseline   Plan  Patient Instructions  Continue on Breo 1 puff daily, rinse after use Continue on Incruse 1 puff daily Albuterol inhaler or nebulizer as needed Activity as tolerated Continue on Oxygen 2.5 L/min Recommend RSV vaccine at pharmacy .  Follow up in 3-4 months with Dr. Shearon Stalls or Murrel Bertram NP and As needed   Please contact office for sooner follow up if symptoms do not improve or worsen or seek emergency care

## 2022-04-19 NOTE — Assessment & Plan Note (Signed)
Continue on oxygen to maintain O2 saturations greater than 88 to 90%

## 2022-04-19 NOTE — Assessment & Plan Note (Signed)
Continue on oxygen, diuretics and COPD management.  Appears euvolemic on exam.

## 2022-04-19 NOTE — Patient Instructions (Addendum)
Continue on Breo 1 puff daily, rinse after use Continue on Incruse 1 puff daily Albuterol inhaler or nebulizer as needed Activity as tolerated Continue on Oxygen 2.5 L/min Recommend RSV vaccine at pharmacy .  Follow up in 3-4 months with Dr. Shearon Stalls or Aadvik Roker NP and As needed   Please contact office for sooner follow up if symptoms do not improve or worsen or seek emergency care

## 2022-04-19 NOTE — Progress Notes (Signed)
_0  ID: Caitlin Oliver, female    DOB: [birth year], 82 y.o.   MRN: [id number]  Chief Complaint  Patient presents with   Follow-up    Referring provider: Amie Critchley  HPI: 82 year old female seen for pulmonary consult December 2022 to establish for COPD and chronic respiratory failure on home oxygen Medical history significant for group 2-3 secondary pulmonary hypertension, previous lung cancer status post right upper lobectomy in 2017 with no chemo or radiation.  Former patient of Dr. Melvyn Novas  TEST/EVENTS :  2D echo February 02, 2022 EF 60 to 08%, grade 1 diastolic dysfunction, right ventricular systolic function moderately reduced and right ventricle size mildly enlarged   VQ scan February 01, 2022 normal, no evidence of PE.   CT chest June 18, 2018 negative for PE, bilateral pleural effusions, cardiomegaly, 6 mm right lower lobe nodule, hepatic steatosis  PFTs February 19, 2019 FEV1 62%, ratio 62, FVC 75%, DLCO 51%.  04/19/2022 Follow up ; COPD , Chronic respiratory failure on Oxygen Patient returns for a 76-monthfollow-up.  Patient was seen last visit for a posthospital follow-up.  She was admitted in October for a COPD exacerbation.  Treated with antibiotics steroids and nebulized bronchodilators.  She remains on Breo and Incruse.  Says it is hard to afford inhalers at times -costs $180/month.  Since last visit patient is feeling better.  With decreased cough congestion. Eating well. No n/v/d. No hemoptysis . Weight is stable.   Patient lives at home alone.  Uses rolling walker.  Does not drive.  Has caregiver that comes 3 days a week to help with driving and shopping.  Able to cook light meals and does light house chores. Patient has a history of lung cancer.  Recommended a serial CT chest.  Patient has declined ongoing imaging.   She remains on oxygen 2.5 L.  Uses a small E tank when she is away from home.  Flu shot and covid booster is utd. PVX 20 utd.    Allergies   Allergen Reactions   Fentanyl Shortness Of Breath    Immunization History  Administered Date(s) Administered   Covid-19, Mrna,Vaccine(Spikevax)185yrand older 03/26/2022   Fluad Quad(high Dose 65+) 02/07/2022   Influenza, High Dose Seasonal PF 01/17/2016, 01/28/2017, 01/16/2018, 01/30/2019, 03/08/2020, 01/09/2021, 02/07/2022   Influenza-Unspecified 01/31/2016   PFIZER Comirnaty(Gray Top)Covid-19 Tri-Sucrose Vaccine 10/12/2020   PFIZER(Purple Top)SARS-COV-2 Vaccination 10/08/2019, 10/28/2019   PNEUMOCOCCAL CONJUGATE-20 03/26/2022   Pfizer Covid-19 Vaccine Bivalent Booster 127yr up 03/30/2021   Pneumococcal Conjugate-13 02/09/2015   Pneumococcal Polysaccharide-23 05/08/2013   Tdap 05/05/2021   Zoster Recombinat (Shingrix) 10/21/2017   Zoster, Live 05/14/2011    Past Medical History:  Diagnosis Date   Anxiety    Arthritis    Cancer ([facility]  lung cancer   Carotid artery disease (HCC)    Cataracts, bilateral    CHF (congestive heart failure) (HCC)    Claudication (HCC)    COPD (chronic obstructive pulmonary disease) ([facility]  Diabetes mellitus    Dizzy    Family history of adverse reaction to anesthesia    Pt nephew has PONV   GERD (gastroesophageal reflux disease)    Headache    History of kidney stones    Hypercholesteremia    Hypertension    Peripheral arterial disease ([facility]CC[place]  bilateral iliac artery stenosis by angiography   Pneumonia    Stomach ulcer    [place]roke ([place]  Tobacco abuse  Wears glasses     Tobacco History: Social History   Tobacco Use  Smoking Status Former   Packs/day: 1.50   Years: 56.00   Total pack years: 84.00   Types: Cigarettes   Quit date: 07/07/2013   Years since quitting: 8.7  Smokeless Tobacco Former   Quit date: 02/25/2013   Counseling given: Not Answered   Outpatient Medications Prior to Visit  Medication Sig Dispense Refill   acetaminophen (TYLENOL) 500 MG tablet Take 500 mg by mouth every 6 (six) hours as needed for  moderate pain.     albuterol (PROVENTIL HFA;VENTOLIN HFA) 108 (90 Base) MCG/ACT inhaler Inhale 2 puffs into the lungs every 6 (six) hours as needed for wheezing or shortness of breath.      albuterol (PROVENTIL) (2.5 MG/3ML) 0.083% nebulizer solution Take 2.5 mg by nebulization in the morning and at bedtime.     Ascorbic Acid (VITAMIN C) 1000 MG tablet Take 1,000 mg by mouth daily.     atorvastatin (LIPITOR) 40 MG tablet Take 1 tablet (40 mg total) by mouth daily.     bisoprolol (ZEBETA) 5 MG tablet Take 0.5 tablets (2.5 mg total) by mouth daily. 45 tablet 3   carboxymethylcellul-glycerin (REFRESH OPTIVE) 0.5-0.9 % ophthalmic solution 1 drop as needed for dry eyes.     Cholecalciferol (VITAMIN D3 PO) Take 50 mcg by mouth daily. 1 capsules daily     clopidogrel (PLAVIX) 75 MG tablet Take 75 mg by mouth daily.     cyclobenzaprine (FLEXERIL) 5 MG tablet Take 5 mg by mouth as needed.     docusate sodium (COLACE) 100 MG capsule Take 100 mg by mouth 2 (two) times daily as needed for mild constipation.     escitalopram (LEXAPRO) 10 MG tablet Take 10 mg by mouth daily.     ferrous sulfate 325 (65 FE) MG tablet Take 325 mg by mouth daily with breakfast.     fluticasone furoate-vilanterol (BREO ELLIPTA) 200-25 MCG/ACT AEPB Inhale 1 puff into the lungs daily. 28 each 0   Magnesium 250 MG TABS Take 250 mg by mouth in the morning and at bedtime. 1 in am and 1 at HS.     meclizine (ANTIVERT) 25 MG tablet Take 25 mg by mouth daily as needed.     Multiple Vitamin (MULTIVITAMIN WITH MINERALS) TABS tablet Take 1 tablet by mouth daily.     naphazoline-glycerin (CLEAR EYES REDNESS) 0.012-0.25 % SOLN 1-2 drops 4 (four) times daily as needed for eye irritation.     ondansetron (ZOFRAN) 4 MG tablet Take 4 mg by mouth daily as needed for nausea.     ONE TOUCH ULTRA TEST test strip      OXYGEN Inhale 2.5 L into the lungs.     pantoprazole (PROTONIX) 40 MG tablet Take 40 mg by mouth 2 (two) times daily.     polyethylene  glycol (MIRALAX / GLYCOLAX) 17 g packet Take 17 g by mouth daily as needed for mild constipation.     potassium chloride SA (KLOR-CON M) 20 MEQ tablet Take 1 tablet (20 mEq total) by mouth daily. 60 tablet 3   senna (SENOKOT) 8.6 MG TABS tablet Take 1 tablet (8.6 mg total) by mouth daily. 120 tablet 0   torsemide (DEMADEX) 20 MG tablet Take 2 tablets (40 mg total) by mouth 2 (two) times daily. Take 2 tablet by mouth 2 times daily. 112 tablet 4   TRADJENTA 5 MG TABS tablet Take 5 mg by mouth daily.  umeclidinium bromide (INCRUSE ELLIPTA) 62.5 MCG/ACT AEPB Inhale 1 puff into the lungs as needed.     No facility-administered medications prior to visit.     Review of Systems:   Constitutional:   No  weight loss, night sweats,  Fevers, chills,  +fatigue, or  lassitude.  HEENT:   No headaches,  Difficulty swallowing,  Tooth/dental problems, or  Sore throat,                No sneezing, itching, ear ache, nasal congestion, post nasal drip,   CV:  No chest pain,  Orthopnea, PND, swelling in lower extremities, anasarca, dizziness, palpitations, syncope.   GI  No heartburn, indigestion, abdominal pain, nausea, vomiting, diarrhea, change in bowel habits, loss of appetite, bloody stools.   Resp:   No chest wall deformity  Skin: no rash or lesions.  GU: no dysuria, change in color of urine, no urgency or frequency.  No flank pain, no hematuria   MS:  No joint pain or swelling.  No decreased range of motion.  No back pain.    Physical Exam  BP (!) 108/50 (BP Location: Left Arm, Patient Position: Sitting, Cuff Size: Normal)   Pulse 76   Temp 97.6 F (36.4 C) (Oral)   Ht _0  (1.626 m)   Wt 161 lb 3.2 oz (73.1 kg)   SpO2 91%   BMI 27.67 kg/m   GEN: A/Ox3; pleasant , NAD, elderly , O2 , rolling walker with bench seat.    HEENT:  [place]/AT,   NOSE-clear, THROAT-clear, no lesions, no postnasal drip or exudate noted.   NECK:  Supple w/ fair ROM; no JVD; normal carotid impulses w/o bruits;  no thyromegaly or nodules palpated; no lymphadenopathy.    RESP  decreased BS in bases  no accessory muscle use, no dullness to percussion  CARD:  RRR, no m/r/g, tr  peripheral edema, pulses intact, no cyanosis or clubbing.  GI:   Soft & nt; nml bowel sounds; no organomegaly or masses detected.   Musco: Warm bil, no deformities or joint swelling noted.   Neuro: alert, no focal deficits noted.    Skin: Warm, no lesions or rashes    Lab Results:       Imaging: No results found.       Latest Ref Rng & Units 02/19/2019    3:21 PM 12/15/2015   10:48 AM  PFT Results  FVC-Pre L 2.03  2.49   FVC-Predicted Pre % 75  88   FVC-Post L  2.69   FVC-Predicted Post %  95   Pre FEV1/FVC % % 62  71   Post FEV1/FCV % %  72   FEV1-Pre L 1.25  1.77   FEV1-Predicted Pre % 62  84   FEV1-Post L  1.92   DLCO uncorrected ml/min/mmHg 9.75  16.88   DLCO UNC% % 51  69   DLVA Predicted % 63  80   TLC L 5.65  5.76   TLC % Predicted % 111  114   RV % Predicted % 158  144     No results found for: "NITRICOXIDE"      Assessment & Plan:   COPD Moderate airflow obstruction on previous PFTs in 2020.-Patient is to continue on current triple therapy maintenance regimen. Vaccines are up-to-date Exacerbation fall 2023 with hospitalization - resolved and back to baseline   Plan  Patient Instructions  Continue on Breo 1 puff daily, rinse after use Continue on Incruse 1 puff  daily Albuterol inhaler or nebulizer as needed Activity as tolerated Continue on Oxygen 2.5 L/min Recommend RSV vaccine at pharmacy .  Follow up in 3-4 months with Dr. Shearon Stalls or Mahad Newstrom NP and As needed   Please contact office for sooner follow up if symptoms do not improve or worsen or seek emergency care        Chronic hypoxemic respiratory failure ([place]) Continue on oxygen to maintain O2 saturations greater than 88 to 90%  Pulmonary hypertension, unspecified ([place]) Continue on oxygen, diuretics and COPD  management.  Appears euvolemic on exam.     Rexene Edison, NP 04/19/2022

## 2022-04-24 ENCOUNTER — Other Ambulatory Visit (HOSPITAL_COMMUNITY): Payer: Self-pay

## 2022-04-24 MED ORDER — TRADJENTA 5 MG PO TABS
5.0000 mg | ORAL_TABLET | Freq: Every day | ORAL | 3 refills | Status: AC
  Filled 2022-04-24: qty 90, 90d supply, fill #0

## 2022-04-24 MED ORDER — MECLIZINE HCL 25 MG PO TABS
25.0000 mg | ORAL_TABLET | Freq: Every morning | ORAL | 1 refills | Status: AC
  Filled 2022-04-24 – 2022-04-26 (×2): qty 90, 90d supply, fill #0

## 2022-04-24 MED ORDER — CYCLOBENZAPRINE HCL 5 MG PO TABS
5.0000 mg | ORAL_TABLET | Freq: Two times a day (BID) | ORAL | 3 refills | Status: DC | PRN
  Filled 2022-04-24: qty 180, 90d supply, fill #0

## 2022-04-24 MED ORDER — BISOPROLOL FUMARATE 5 MG PO TABS
2.5000 mg | ORAL_TABLET | Freq: Every day | ORAL | 3 refills | Status: DC
  Filled 2022-04-24: qty 45, 90d supply, fill #0

## 2022-04-24 MED ORDER — ATORVASTATIN CALCIUM 40 MG PO TABS
40.0000 mg | ORAL_TABLET | Freq: Every day | ORAL | 3 refills | Status: AC
  Filled 2022-04-24: qty 90, 90d supply, fill #0

## 2022-04-24 MED ORDER — FERROUS SULFATE 325 (65 FE) MG PO TABS: 325.0000 mg | ORAL_TABLET | Freq: Every day | ORAL | 3 refills | Status: AC

## 2022-04-24 MED ORDER — PANTOPRAZOLE SODIUM 40 MG PO TBEC
40.0000 mg | DELAYED_RELEASE_TABLET | Freq: Two times a day (BID) | ORAL | 0 refills | Status: AC
  Filled 2022-04-24: qty 60, 30d supply, fill #0

## 2022-04-24 MED ORDER — TORSEMIDE 40 MG PO TABS
2.0000 | ORAL_TABLET | Freq: Two times a day (BID) | ORAL | 3 refills | Status: DC
  Filled 2022-04-24: qty 120, 30d supply, fill #0
  Filled 2022-04-26: qty 360, 90d supply, fill #0
  Filled 2022-04-26 – 2022-04-28 (×3): qty 120, 30d supply, fill #0

## 2022-04-24 MED ORDER — CLOPIDOGREL BISULFATE 75 MG PO TABS
75.0000 mg | ORAL_TABLET | Freq: Every day | ORAL | 3 refills | Status: AC
  Filled 2022-04-24: qty 90, 90d supply, fill #0

## 2022-04-24 MED ORDER — ESCITALOPRAM OXALATE 10 MG PO TABS
10.0000 mg | ORAL_TABLET | Freq: Every day | ORAL | 3 refills | Status: AC
  Filled 2022-04-24: qty 90, 90d supply, fill #0

## 2022-04-25 ENCOUNTER — Other Ambulatory Visit (HOSPITAL_COMMUNITY): Payer: Self-pay

## 2022-04-26 ENCOUNTER — Other Ambulatory Visit: Payer: Self-pay

## 2022-04-26 ENCOUNTER — Other Ambulatory Visit (HOSPITAL_COMMUNITY): Payer: Self-pay

## 2022-04-27 ENCOUNTER — Encounter (HOSPITAL_COMMUNITY): Payer: Self-pay

## 2022-04-27 ENCOUNTER — Other Ambulatory Visit (HOSPITAL_COMMUNITY): Payer: Self-pay

## 2022-04-28 ENCOUNTER — Other Ambulatory Visit (HOSPITAL_COMMUNITY): Payer: Self-pay

## 2022-04-30 ENCOUNTER — Other Ambulatory Visit: Payer: Self-pay

## 2022-05-01 ENCOUNTER — Telehealth: Payer: Self-pay | Admitting: Podiatry

## 2022-05-01 NOTE — Telephone Encounter (Signed)
[name], I called and spoke to [facility] at [facility] about some prior authorizations and she tried to get me in contact with you for her. They received a prior authorization request for diabetic shoes for [name] and she wanted to confirm the start date of July 26. If you would please call her back at [phone] and chose option 3 and ask for [facility]. Thank you.

## 2022-05-07 ENCOUNTER — Telehealth (HOSPITAL_COMMUNITY): Payer: Self-pay

## 2022-05-07 NOTE — Telephone Encounter (Signed)
I spoke to patient and informed her about taking the generic versus brand. She understood.

## 2022-05-07 NOTE — Telephone Encounter (Signed)
Patient called and stated her insurance sent her a letter that they are no longer covering [place]? She spelled it, but then she was talking about Torsemide.  I tried to call her to get further information but she did not answer. Can you look into?

## 2022-05-08 ENCOUNTER — Other Ambulatory Visit (HOSPITAL_COMMUNITY): Payer: Self-pay

## 2022-06-01 ENCOUNTER — Other Ambulatory Visit (HOSPITAL_COMMUNITY): Payer: Self-pay

## 2022-06-05 ENCOUNTER — Other Ambulatory Visit (HOSPITAL_COMMUNITY): Payer: Self-pay

## 2022-06-05 DIAGNOSIS — I5032 Chronic diastolic (congestive) heart failure: Secondary | ICD-10-CM

## 2022-06-05 MED ORDER — TORSEMIDE 40 MG PO TABS: 2.0000 | ORAL_TABLET | Freq: Two times a day (BID) | ORAL | 3 refills | Status: DC

## 2022-06-06 MED ORDER — TORSEMIDE 20 MG PO TABS: 40.0000 mg | ORAL_TABLET | Freq: Two times a day (BID) | ORAL | 3 refills | Status: DC

## 2022-06-06 NOTE — Addendum Note (Signed)
Addended by: Kerry Dory on: 06/06/2022 09:21 AM   Modules accepted: Orders

## 2022-07-11 ENCOUNTER — Telehealth: Payer: Self-pay | Admitting: Adult Health

## 2022-07-11 DIAGNOSIS — J411 Mucopurulent chronic bronchitis: Secondary | ICD-10-CM

## 2022-07-11 DIAGNOSIS — J441 Chronic obstructive pulmonary disease with (acute) exacerbation: Secondary | ICD-10-CM

## 2022-07-11 NOTE — Telephone Encounter (Signed)
Yes please

## 2022-07-11 NOTE — Telephone Encounter (Signed)
Spoke with patient. She needs new mask and tubing for nebulizer machine.  Dr. Lamonte Sakai since Lynelle Smoke is not available are fine with [place] placing this order

## 2022-07-11 NOTE — Telephone Encounter (Signed)
Pt. Needs new order for face mask and tubing sent to [facility]

## 2022-07-12 NOTE — Telephone Encounter (Signed)
ATC patient. Per DPR, left detailed vm for patient to let her know order has been placed for new mask and tubing for her nebulizer machine.   Nothing further needed.

## 2022-07-17 NOTE — Telephone Encounter (Signed)
Of course please send to me

## 2022-07-17 NOTE — Telephone Encounter (Signed)
Pt called the office stating that she is still needing to have the order for her nebulizer mask and tubing signed so the order can be sent to [facility].  Community message was sent to [facility] yesterday 4/1 about the order needing to be signed off on but this was not handled.  Pt called the office very upset due to calling [facility] and was told that they still have not received an order from our office.  Pt is wanting TP to now sign off on this since she usually sees her.  [name], please advise if you are okay with a new order being placed for pt to receive a new mask and tubing for her nebulizer machine.

## 2022-07-18 NOTE — Telephone Encounter (Signed)
New order has been placed under Caitlin Oliver for the neb supplies that pt is needing. Called and spoke with pt letting her know the DME order was placed and she verbalized understanding. Nothing further needed.

## 2022-07-18 NOTE — Addendum Note (Signed)
Addended by: Lorretta Harp on: 07/18/2022 09:10 AM   Modules accepted: Orders

## 2022-07-19 ENCOUNTER — Ambulatory Visit (INDEPENDENT_AMBULATORY_CARE_PROVIDER_SITE_OTHER): Payer: PPO

## 2022-07-19 ENCOUNTER — Ambulatory Visit: Payer: PPO | Admitting: Adult Health

## 2022-07-19 VITALS — BP 100/58 | HR 68 | Temp 97.3°F

## 2022-07-19 DIAGNOSIS — U099 Post covid-19 condition, unspecified: Secondary | ICD-10-CM

## 2022-07-19 DIAGNOSIS — J449 Chronic obstructive pulmonary disease, unspecified: Secondary | ICD-10-CM

## 2022-07-19 DIAGNOSIS — J441 Chronic obstructive pulmonary disease with (acute) exacerbation: Secondary | ICD-10-CM

## 2022-07-19 DIAGNOSIS — C3492 Malignant neoplasm of unspecified part of left bronchus or lung: Secondary | ICD-10-CM

## 2022-07-19 DIAGNOSIS — J9611 Chronic respiratory failure with hypoxia: Secondary | ICD-10-CM | POA: Diagnosis not present

## 2022-07-19 MED ORDER — PREDNISONE 10 MG PO TABS: ORAL_TABLET | ORAL | 0 refills | Status: DC

## 2022-07-19 NOTE — Progress Notes (Signed)
@Patient  ID: Caitlin Oliver, female    DOB: 1940-04-28, 82 y.o.   MRN: [id number]  Chief Complaint  Patient presents with   Follow-up    Referring provider: Gala Lewandowsky  HPI: 82 yo female seen for pulmonary consult Dec 2022 to re-establish for COPD and chronic respiratory failure on home oxygen .  Medical history significant for  PAH, previous lung cancer s/p RUL lobectomy in 2018 (no chemo/xrt) . Former patient of Dr. Sherene Sires .  Hx of Carotid artery disease , CVA, DM  TEST/EVENTS :  R/LHC in 11/2018 revealed mild to moderate nonobstructive CAD, severe pulmonary hypertension, moderate to severely elevated left and right heart filling pressures   '2D echo February 02, 2022 EF 60 to 65%, grade 1 diastolic dysfunction, right ventricular systolic function moderately reduced and right ventricle size mildly enlarged   VQ scan February 01, 2022 normal, no evidence of PE.   CT chest June 18, 2018 negative for PE, bilateral pleural effusions, cardiomegaly, 6 mm right lower lobe nodule, hepatic steatosis   PFTs February 19, 2019 FEV1 62%, ratio 62, FVC 75%, DLCO 51%.  07/19/2022 Follow up: COPD, O2 RF  Patient returns for a 18-month follow-up.  Patient says since last visit she has not been doing as well.  She tested positive for COVID-19 around 1 month ago.  Was treated with antibiotics and steroids.  Says since then that she has had increased cough that is minimally productive.  Feels more short of breath with decreased activity tolerance.  She denies any hemoptysis, chest pain, orthopnea.  Has chronic lower extremity edema.  He is on Demadex 40 mg twice daily.  She remains on Breo and Incruse.  We discussed last visit that she has a history of lung cancer and needs serial CT follow-up.  Patient declines ongoing CT imaging.  Does agree to a chest x-ray today. She remains on oxygen 2.5 L at home.  She does use small E tanks with pulsed oxygen when she is away from home.  Today oxygen  decreased into the upper 80s on 2.5 L.  We increased up to 3 L pulsed and was able to keep O2 saturations greater than 90%.  Patient does live at home alone.  Uses a rolling walker.  She does not drive.  Has a caregiver that comes in 3 days a week to help her.  Allergies  Allergen Reactions   Fentanyl Shortness Of Breath    Immunization History  Administered Date(s) Administered   Covid-19, Mrna,Vaccine(Spikevax)66yrs and older 03/26/2022   Fluad Quad(high Dose 65+) 02/07/2022   Influenza, High Dose Seasonal PF 01/17/2016, 01/28/2017, 01/16/2018, 01/30/2019, 03/08/2020, 01/09/2021, 02/07/2022   Influenza-Unspecified 01/31/2016   PFIZER Comirnaty(Gray Top)Covid-19 Tri-Sucrose Vaccine 10/12/2020   PFIZER(Purple Top)SARS-COV-2 Vaccination 10/08/2019, 10/28/2019   PNEUMOCOCCAL CONJUGATE-20 03/26/2022   Pfizer Covid-19 Vaccine Bivalent Booster 23yrs & up 03/30/2021   Pneumococcal Conjugate-13 02/09/2015   Pneumococcal Polysaccharide-23 05/08/2013   Tdap 05/05/2021   Zoster Recombinat (Shingrix) 10/21/2017   Zoster, Live 05/14/2011    Past Medical History:  Diagnosis Date   Anxiety    Arthritis    Cancer (HCC)    lung cancer   Carotid artery disease (HCC)    Cataracts, bilateral    CHF (congestive heart failure) (HCC)    Claudication (HCC)    COPD (chronic obstructive pulmonary disease) (HCC)    Diabetes mellitus    Dizzy    Family history of adverse reaction to anesthesia    Pt nephew  has PONV   GERD (gastroesophageal reflux disease)    Headache    History of kidney stones    Hypercholesteremia    Hypertension    Peripheral arterial disease (HCC)    bilateral iliac artery stenosis by angiography   Pneumonia    Stomach ulcer    Stroke (HCC)    Tobacco abuse    Wears glasses     Tobacco History: Social History   Tobacco Use  Smoking Status Former   Packs/day: 1.50   Years: 56.00   Additional pack years: 0.00   Total pack years: 84.00   Types: Cigarettes    Quit date: 07/07/2013   Years since quitting: 9.0  Smokeless Tobacco Former   Quit date: 02/25/2013   Counseling given: Not Answered   Outpatient Medications Prior to Visit  Medication Sig Dispense Refill   acetaminophen (TYLENOL) 500 MG tablet Take 500 mg by mouth every 6 (six) hours as needed for moderate pain.     albuterol (PROVENTIL HFA;VENTOLIN HFA) 108 (90 Base) MCG/ACT inhaler Inhale 2 puffs into the lungs every 6 (six) hours as needed for wheezing or shortness of breath.      albuterol (PROVENTIL) (2.5 MG/3ML) 0.083% nebulizer solution Take 2.5 mg by nebulization in the morning and at bedtime.     Ascorbic Acid (VITAMIN C) 1000 MG tablet Take 1,000 mg by mouth daily.     atorvastatin (LIPITOR) 40 MG tablet Take 1 tablet (40 mg total) by mouth daily. 90 tablet 3   bisoprolol (ZEBETA) 5 MG tablet Take 0.5 tablets (2.5 mg total) by mouth daily. 45 tablet 3   carboxymethylcellul-glycerin (REFRESH OPTIVE) 0.5-0.9 % ophthalmic solution 1 drop as needed for dry eyes.     Cholecalciferol (VITAMIN D3 PO) Take 50 mcg by mouth daily. 1 capsules daily     clopidogrel (PLAVIX) 75 MG tablet Take 1 tablet (75 mg total) by mouth daily. 90 tablet 3   cyclobenzaprine (FLEXERIL) 5 MG tablet Take 1 tablet (5 mg total) by mouth 2 (two) times daily as needed for muscle spasms 180 tablet 3   docusate sodium (COLACE) 100 MG capsule Take 100 mg by mouth 2 (two) times daily as needed for mild constipation.     escitalopram (LEXAPRO) 10 MG tablet Take 1 tablet (10 mg total) by mouth daily. 90 tablet 3   ferrous sulfate 325 (65 FE) MG tablet Take 1 tablet (325 mg total) by mouth daily with breakfast. 90 tablet 3   fluticasone furoate-vilanterol (BREO ELLIPTA) 200-25 MCG/ACT AEPB Inhale 1 puff into the lungs daily. 28 each 0   linagliptin (TRADJENTA) 5 MG TABS tablet Take 1 tablet (5 mg total) by mouth daily. 90 tablet 3   Magnesium 250 MG TABS Take 250 mg by mouth in the morning and at bedtime. 1 in am and 1 at  HS.     meclizine (ANTIVERT) 25 MG tablet Take 1 tablet (25 mg total) by mouth every morning. 90 tablet 1   Multiple Vitamin (MULTIVITAMIN WITH MINERALS) TABS tablet Take 1 tablet by mouth daily.     ondansetron (ZOFRAN) 4 MG tablet Take 4 mg by mouth daily as needed for nausea.     ONE TOUCH ULTRA TEST test strip      OXYGEN Inhale 2.5 L into the lungs.     pantoprazole (PROTONIX) 40 MG tablet Take 1 tablet (40 mg total) by mouth 2 (two) times daily. 60 tablet 0   naphazoline-glycerin (CLEAR EYES REDNESS) 0.012-0.25 % SOLN 1-2  drops 4 (four) times daily as needed for eye irritation.     polyethylene glycol (MIRALAX / GLYCOLAX) 17 g packet Take 17 g by mouth daily as needed for mild constipation.     potassium chloride SA (KLOR-CON M) 20 MEQ tablet Take 1 tablet (20 mEq total) by mouth daily. 60 tablet 3   senna (SENOKOT) 8.6 MG TABS tablet Take 1 tablet (8.6 mg total) by mouth daily. 120 tablet 0   torsemide (DEMADEX) 20 MG tablet Take 2 tablets (40 mg total) by mouth 2 (two) times daily. 120 tablet 3   Torsemide 40 MG TABS Take 2 tablets by mouth 2 (two) times daily. 112 tablet 3   TRADJENTA 5 MG TABS tablet Take 5 mg by mouth daily.     umeclidinium bromide (INCRUSE ELLIPTA) 62.5 MCG/ACT AEPB Inhale 1 puff into the lungs as needed.     atorvastatin (LIPITOR) 40 MG tablet Take 1 tablet (40 mg total) by mouth daily. (Patient not taking: Reported on 07/19/2022)     bisoprolol (ZEBETA) 5 MG tablet Take 0.5 tablets (2.5 mg total) by mouth daily. (Patient not taking: Reported on 07/19/2022) 45 tablet 3   clopidogrel (PLAVIX) 75 MG tablet Take 75 mg by mouth daily. (Patient not taking: Reported on 07/19/2022)     cyclobenzaprine (FLEXERIL) 5 MG tablet Take 5 mg by mouth as needed. (Patient not taking: Reported on 07/19/2022)     escitalopram (LEXAPRO) 10 MG tablet Take 10 mg by mouth daily. (Patient not taking: Reported on 07/19/2022)     ferrous sulfate 325 (65 FE) MG tablet Take 325 mg by mouth daily with  breakfast. (Patient not taking: Reported on 07/19/2022)     meclizine (ANTIVERT) 25 MG tablet Take 25 mg by mouth daily as needed. (Patient not taking: Reported on 07/19/2022)     pantoprazole (PROTONIX) 40 MG tablet Take 40 mg by mouth 2 (two) times daily. (Patient not taking: Reported on 07/19/2022)     No facility-administered medications prior to visit.     Review of Systems:   Constitutional:   No  weight loss, night sweats,  Fevers, chills,  +fatigue, or  lassitude.  HEENT:   No headaches,  Difficulty swallowing,  Tooth/dental problems, or  Sore throat,                No sneezing, itching, ear ache, nasal congestion, post nasal drip,   CV:  No chest pain,  Orthopnea, PND, +swelling in lower extremities, anasarca, dizziness, palpitations, syncope.   GI  No heartburn, indigestion, abdominal pain, nausea, vomiting, diarrhea, change in bowel habits, loss of appetite, bloody stools.   Resp:   No chest wall deformity  Skin: no rash or lesions.  GU: no dysuria, change in color of urine, no urgency or frequency.  No flank pain, no hematuria   MS:  No joint pain or swelling.  No decreased range of motion.  No back pain.    Physical Exam    GEN: A/Ox3; pleasant , NAD, elderly, rolling walker   HEENT:  [place]/AT,  NOSE-clear, THROAT-clear, no lesions, no postnasal drip or exudate noted.   NECK:  Supple w/ fair ROM; no JVD; normal carotid impulses w/o bruits; no thyromegaly or nodules palpated; no lymphadenopathy.    RESP few trace rhonchi  no accessory muscle use, no dullness to percussion  CARD:  RRR, no m/r/g, tr to 1+ peripheral edema, pulses intact, no cyanosis or clubbing.  GI:   Soft & nt; nml bowel  sounds; no organomegaly or masses detected.   Musco: Warm bil, no deformities or joint swelling noted.   Neuro: alert, no focal deficits noted.    Skin: Warm, no lesions or rashes    Lab Results:  CBC   BMET   Imaging: No results found.       Latest Ref Rng &  Units 02/19/2019    3:21 PM 12/15/2015   10:48 AM  PFT Results  FVC-Pre L 2.03  2.49   FVC-Predicted Pre % 75  88   FVC-Post L  2.69   FVC-Predicted Post %  95   Pre FEV1/FVC % % 62  71   Post FEV1/FCV % %  72   FEV1-Pre L 1.25  1.77   FEV1-Predicted Pre % 62  84   FEV1-Post L  1.92   DLCO uncorrected ml/min/mmHg 9.75  16.88   DLCO UNC% % 51  69   DLVA Predicted % 63  80   TLC L 5.65  5.76   TLC % Predicted % 111  114   RV % Predicted % 158  144     No results found for: "NITRICOXIDE"      Assessment & Plan:   No problem-specific Assessment & Plan notes found for this encounter.     Rubye Oaks, NP 07/19/2022

## 2022-07-19 NOTE — Patient Instructions (Addendum)
Chest xray today.  Prednisone taper over next week .  Continue on Breo 1 puff daily, rinse after use Continue on Incruse 1 puff daily Albuterol inhaler or nebulizer as needed Activity as tolerated Increase Oxygen 3l/m (pulsed O2) on E Tank Continue on 2.5-3 l/,m at home- goal is to keep Oxygen >88-90%.  Follow up in 6 weeks with Dr. Shearon Stalls or Simonne Boulos NP and As needed   Please contact office for sooner follow up if symptoms do not improve or worsen or seek emergency care

## 2022-07-20 NOTE — Assessment & Plan Note (Signed)
Recommend serial CT chest.  Patient declines.

## 2022-07-20 NOTE — Assessment & Plan Note (Signed)
Continue on oxygen to maintain O2 saturations greater than 88 to 90%.  Advised to use 3 L pulsed oxygen when ambulating.

## 2022-07-20 NOTE — Assessment & Plan Note (Signed)
Slow to resolve COPD exacerbation after recent COVID-19 infection 1 month ago.  Check chest x-ray today.  Will treat with a prednisone taper.  Plan ' Patient Instructions  Chest xray today.  Prednisone taper over next week .  Continue on Breo 1 puff daily, rinse after use Continue on Incruse 1 puff daily Albuterol inhaler or nebulizer as needed Activity as tolerated Increase Oxygen 3l/m (pulsed O2) on E Tank Continue on 2.5-3 l/,m at home- goal is to keep Oxygen >88-90%.  Follow up in 6 weeks with Dr. Celine Mans or Willaim Mode NP and As needed   Please contact office for sooner follow up if symptoms do not improve or worsen or seek emergency care

## 2022-08-01 NOTE — Progress Notes (Signed)
ATC x1.  LVM to return call. 

## 2022-08-22 ENCOUNTER — Ambulatory Visit: Payer: PPO | Attending: Cardiovascular Disease | Admitting: Cardiovascular Disease

## 2022-08-22 ENCOUNTER — Encounter: Payer: Self-pay | Admitting: Cardiovascular Disease

## 2022-08-22 VITALS — BP 110/48 | HR 68 | Ht 64.0 in | Wt 162.0 lb

## 2022-08-22 DIAGNOSIS — I5033 Acute on chronic diastolic (congestive) heart failure: Secondary | ICD-10-CM

## 2022-08-22 DIAGNOSIS — E782 Mixed hyperlipidemia: Secondary | ICD-10-CM

## 2022-08-22 DIAGNOSIS — Z720 Tobacco use: Secondary | ICD-10-CM

## 2022-08-22 DIAGNOSIS — I739 Peripheral vascular disease, unspecified: Secondary | ICD-10-CM | POA: Diagnosis not present

## 2022-08-22 DIAGNOSIS — I10 Essential (primary) hypertension: Secondary | ICD-10-CM | POA: Diagnosis not present

## 2022-08-22 DIAGNOSIS — Z9889 Other specified postprocedural states: Secondary | ICD-10-CM

## 2022-08-22 NOTE — Patient Instructions (Addendum)
Medication Instructions:  Your physician recommends that you continue on your current medications as directed. Please refer to the Current Medication list given to you today.  *If you need a refill on your cardiac medications before your next appointment, please call your pharmacy*   Testing/Procedures: Your physician has requested that you have an Aorta/Iliac Duplex. This will be take place at [address], [address].  No food after 11PM the night before.  Water is OK. (Don't drink liquids if you have been instructed not to for ANOTHER test) Avoid foods that produce bowel gas, for 24 hours prior to exam (see below). No breakfast, no chewing gum, no smoking or carbonated beverages. Patient may take morning medications with water. Come in for test at least 15 minutes early to register. To do in June.  Your physician has requested that you have an ankle brachial index (ABI). During this test an ultrasound and blood pressure cuff are used to evaluate the arteries that supply the arms and legs with blood. Allow thirty minutes for this exam. There are no restrictions or special instructions. This will take place at [address] [facility] [address], [address]. To do in June.  Your physician has requested that you have a carotid duplex. This test is an ultrasound of the carotid arteries in your neck. It looks at blood flow through these arteries that supply the brain with blood. Allow one hour for this exam. There are no restrictions or special instructions. This will take place at [address] [facility] [address] [facility], [address]. To do in June.     Follow-Up: At [facility], you and your health needs are our priority.  As part of our continuing mission to provide you with exceptional heart care, we have created designated Provider Care Teams.  These Care Teams include your primary Cardiologist (physician) and Advanced Practice Providers (APPs -  Physician Assistants and Nurse Practitioners) who all work together to  provide you with the care you need, when you need it.  We recommend signing up for the patient portal called "MyChart".  Sign up information is provided on this After Visit Summary.  MyChart is used to connect with patients for Virtual Visits (Telemedicine).  Patients are able to view lab/test results, encounter notes, upcoming appointments, etc.  Non-urgent messages can be sent to your provider as well.   To learn more about what you can do with MyChart, go to [email or web address].    Your next appointment:   6 month(s)  Provider:   Bernadene Person, NP      Then, Nanetta Batty, MD will plan to see you again in 12 month(s).

## 2022-08-22 NOTE — Assessment & Plan Note (Signed)
History of bilateral carotid endarterectomies 2014 2015 performed,.  She is also had left subclavian artery stenting as well as right subclavian artery stenting in the past as well.

## 2022-08-22 NOTE — Progress Notes (Signed)
08/22/2022 Caitlin Oliver   1940-08-27  [id number]  Primary Physician Richmond Campbell., PA-C Primary Cardiologist: Runell Gess MD FACP, [place], [place], [place]  HPI:  Caitlin Oliver is a 82 y.o.  mildly overweight widowed Caucasian female no children (son died 27 years ago) referred by Kathee Delton PA-C at [facility] at [facility] for evaluation and treatment of lifestyle limiting claudication. I last saw her in the office 09/07/2021.  She saw Bernadene Person FNP in the office 03/07/2022.   Her .Risk factors included hypertension, hyperlipidemia and type 2 diabetes. She has a 75-pack-year history of tobacco abuse smoking one and a half packs a day however she has recently quit smoking prior to her interventions.. There's no family history of heart disease. She's never had a heart for stroke. She does complain of dyspnea but denies chest pain. She complains of bilateral lower extremity claudication while walking up an incline which is lifestyle limiting. I age of Imdur 02/25/13 revealing high-grade bilateral internal carotid artery stenosis, high-grade bilateral calcified iliac artery stenosis as most common femoral artery stenoses. She had staged right followed by left carotid endarterectomy performed by Dr. Myra Gianotti which she has recovered from. Because of lifestyle limiting claudication she underwent diamondback orbital rotational atherectomy, PT A. And stenting using ICast covered stents by myself on 07/27/13 which resulted in complete resolution of her claudication symptoms. Her Dopplers normalized as well though she did have a high-frequency signal in the right common femoral artery. She has had a GI bleed in the past and her Plavix was discontinued. She denies chest pain, shortness of breath as well as claudication. She has had left subclavian stenting by Dr. Myra Gianotti 11/15/15. In addition, she had left upper lobectomy by Dr. Dorris Fetch 02/03/16 because of stage IA squamous cell  carcinoma. She has recovered from this.    Since I saw her in the office a year ago she is remained stable.  She does have oxygen dependent COPD.  She had a heart cath performed by Dr. Okey Dupre 11/20/2018 that showed mild to moderate nonobstructive CAD.  She was admitted October 23 with respiratory failure, COPD.  She also had a mild case of COVID earlier this year.   Current Meds  Medication Sig   acetaminophen (TYLENOL) 500 MG tablet Take 500 mg by mouth every 6 (six) hours as needed for moderate pain.   albuterol (PROVENTIL HFA;VENTOLIN HFA) 108 (90 Base) MCG/ACT inhaler Inhale 2 puffs into the lungs every 6 (six) hours as needed for wheezing or shortness of breath.    albuterol (PROVENTIL) (2.5 MG/3ML) 0.083% nebulizer solution Take 2.5 mg by nebulization in the morning and at bedtime.   Ascorbic Acid (VITAMIN C) 1000 MG tablet Take 1,000 mg by mouth daily.   atorvastatin (LIPITOR) 40 MG tablet Take 1 tablet (40 mg total) by mouth daily.   bisoprolol (ZEBETA) 5 MG tablet Take 0.5 tablets (2.5 mg total) by mouth daily.   carboxymethylcellul-glycerin (REFRESH OPTIVE) 0.5-0.9 % ophthalmic solution 1 drop as needed for dry eyes.   Cholecalciferol (VITAMIN D3 PO) Take 50 mcg by mouth daily. 1 capsules daily   clopidogrel (PLAVIX) 75 MG tablet Take 1 tablet (75 mg total) by mouth daily.   cyclobenzaprine (FLEXERIL) 5 MG tablet Take 1 tablet (5 mg total) by mouth 2 (two) times daily as needed for muscle spasms   docusate sodium (COLACE) 100 MG capsule Take 100 mg by mouth 2 (two) times daily as needed for mild constipation.  escitalopram (LEXAPRO) 10 MG tablet Take 1 tablet (10 mg total) by mouth daily.   ferrous sulfate 325 (65 FE) MG tablet Take 1 tablet (325 mg total) by mouth daily with breakfast.   fluticasone furoate-vilanterol (BREO ELLIPTA) 200-25 MCG/ACT AEPB Inhale 1 puff into the lungs daily.   Folic Acid-Vit B6-Vit B12 (FOLBEE) 2.5-25-1 MG TABS tablet Take 1 tablet by mouth daily.    linagliptin (TRADJENTA) 5 MG TABS tablet Take 1 tablet (5 mg total) by mouth daily.   Magnesium 250 MG TABS Take 250 mg by mouth in the morning and at bedtime. 1 in am and 1 at HS.   meclizine (ANTIVERT) 25 MG tablet Take 1 tablet (25 mg total) by mouth every morning.   Multiple Vitamin (MULTIVITAMIN WITH MINERALS) TABS tablet Take 1 tablet by mouth daily.   naphazoline-glycerin (CLEAR EYES REDNESS) 0.012-0.25 % SOLN 1-2 drops 4 (four) times daily as needed for eye irritation.   ondansetron (ZOFRAN) 4 MG tablet Take 4 mg by mouth daily as needed for nausea.   ONE TOUCH ULTRA TEST test strip    OXYGEN Inhale 2.5 L into the lungs.   pantoprazole (PROTONIX) 40 MG tablet Take 1 tablet (40 mg total) by mouth 2 (two) times daily.   polyethylene glycol (MIRALAX / GLYCOLAX) 17 g packet Take 17 g by mouth daily as needed for mild constipation.   potassium chloride SA (KLOR-CON M) 20 MEQ tablet Take 1 tablet (20 mEq total) by mouth daily.   senna (SENOKOT) 8.6 MG TABS tablet Take 1 tablet (8.6 mg total) by mouth daily.   Torsemide 40 MG TABS Take 2 tablets by mouth 2 (two) times daily. (Patient taking differently: Take 2 tablets by mouth 2 (two) times daily. 20 mg, 2 tabs)   umeclidinium bromide (INCRUSE ELLIPTA) 62.5 MCG/ACT AEPB Inhale 1 puff into the lungs as needed.   [DISCONTINUED] predniSONE (DELTASONE) 10 MG tablet 4 tabs for 2 days, then 3 tabs for 2 days, 2 tabs for 2 days, then 1 tab for 2 days, then stop     Allergies  Allergen Reactions   Fentanyl Shortness Of Breath    Social History   Socioeconomic History   Marital status: Widowed    Spouse name: Not on file   Number of children: Not on file   Years of education: Not on file   Highest education level: Not on file  Occupational History   Not on file  Tobacco Use   Smoking status: Former    Packs/day: 1.50    Years: 56.00    Additional pack years: 0.00    Total pack years: 84.00    Types: Cigarettes    Quit date: 07/07/2013     Years since quitting: 9.1   Smokeless tobacco: Former    Quit date: 02/25/2013  Vaping Use   Vaping Use: Some days  Substance and Sexual Activity   Alcohol use: No   Drug use: No   Sexual activity: Never  Other Topics Concern   Not on file  Social History Narrative   Not on file   Social Determinants of Health   Financial Resource Strain: Not on file  Food Insecurity: Not on file  Transportation Needs: Not on file  Physical Activity: Not on file  Stress: Not on file  Social Connections: Not on file  Intimate Partner Violence: Not on file     Review of Systems: General: negative for chills, fever, night sweats or weight changes.  Cardiovascular: negative for  chest pain, dyspnea on exertion, edema, orthopnea, palpitations, paroxysmal nocturnal dyspnea or shortness of breath Dermatological: negative for rash Respiratory: negative for cough or wheezing Urologic: negative for hematuria Abdominal: negative for nausea, vomiting, diarrhea, bright red blood per rectum, melena, or hematemesis Neurologic: negative for visual changes, syncope, or dizziness All other systems reviewed and are otherwise negative except as noted above.    Blood pressure (!) 110/48, pulse 68, height 5\' 4"  (1.626 m), weight 162 lb (73.5 kg).  General appearance: alert and no distress Neck: no adenopathy, no JVD, supple, symmetrical, trachea midline, thyroid not enlarged, symmetric, no tenderness/mass/nodules, and soft bilateral carotid bruits Lungs: clear to auscultation bilaterally Heart: regular rate and rhythm, S1, S2 normal, no murmur, click, rub or gallop Extremities: extremities normal, atraumatic, no cyanosis or edema Pulses: 2+ and symmetric Skin: Skin color, texture, turgor normal. No rashes or lesions Neurologic: Grossly normal  EKG sinus rhythm at 68 without ST or T wave changes.  Personally reviewed this EKG.  I  ASSESSMENT AND PLAN:   Essential hypertension History of essential  hypertension blood pressure measured today at 110/48.  She is on Zebeta.  Hyperlipidemia History of hyperlipidemia on atorvastatin with lipid profile performed 02/07/2022 with a total cholesterol of 113, LDL 44 and HDL 50.  Tobacco abuse Discontinue tobacco abuse in 2015 at the time of her iliac intervention by myself.  She does have oxygen dependent COPD however.  Occlusion and stenosis of carotid artery without mention of cerebral infarction History of bilateral carotid endarterectomies 2014 2015 performed,.  She is also had left subclavian artery stenting as well as right subclavian artery stenting in the past as well.  PAD (peripheral artery disease) (HCC) History of orbital atherectomy, PTA and iCAST covered stenting of her right common iliac artery/13/15 which resulted in marked improvement in her claudication.  Her most recent Doppler studies performed 10/03/2021 revealed her right iliac stent to be patent with mild bilateral iliac disease although she denies claudication.  Acute on chronic heart failure with preserved ejection fraction (HFpEF) (HCC) History of chronic diastolic dysfunction on torsemide with echo performed 02/02/2022 revealing normal LV systolic function with grade 1 diastolic dysfunction.  She did have pulm hypertension in the past and has been followed by Dr. Gala Romney.     Runell Gess MD FACP,FACC,FAHA, [facility] 08/22/2022 10:36 AM

## 2022-08-22 NOTE — Assessment & Plan Note (Signed)
History of essential hypertension blood pressure measured today at 110/48.  She is on Zebeta.

## 2022-08-22 NOTE — Assessment & Plan Note (Addendum)
History of hyperlipidemia on atorvastatin with lipid profile performed 02/07/2022 with a total cholesterol of 113, LDL 44 and HDL 50.

## 2022-08-22 NOTE — Assessment & Plan Note (Signed)
History of orbital atherectomy, PTA and iCAST covered stenting of her right common iliac artery/13/15 which resulted in marked improvement in her claudication.  Her most recent Doppler studies performed 10/03/2021 revealed her right iliac stent to be patent with mild bilateral iliac disease although she denies claudication.

## 2022-08-22 NOTE — Assessment & Plan Note (Signed)
History of chronic diastolic dysfunction on torsemide with echo performed 02/02/2022 revealing normal LV systolic function with grade 1 diastolic dysfunction.  She did have pulm hypertension in the past and has been followed by Dr. Gala Romney.

## 2022-08-22 NOTE — Assessment & Plan Note (Signed)
Discontinue tobacco abuse in 2015 at the time of her iliac intervention by myself.  She does have oxygen dependent COPD however.

## 2022-08-30 ENCOUNTER — Ambulatory Visit: Payer: PPO | Admitting: Internal Medicine

## 2022-08-30 ENCOUNTER — Other Ambulatory Visit (HOSPITAL_COMMUNITY): Payer: Self-pay | Admitting: Internal Medicine

## 2022-08-30 ENCOUNTER — Other Ambulatory Visit (HOSPITAL_COMMUNITY): Payer: Self-pay | Admitting: Physician Assistant

## 2022-08-30 ENCOUNTER — Encounter: Payer: Self-pay | Admitting: Internal Medicine

## 2022-08-30 VITALS — BP 108/64 | HR 63 | Ht 64.0 in | Wt 163.0 lb

## 2022-08-30 DIAGNOSIS — I5032 Chronic diastolic (congestive) heart failure: Secondary | ICD-10-CM

## 2022-08-30 DIAGNOSIS — E1169 Type 2 diabetes mellitus with other specified complication: Secondary | ICD-10-CM

## 2022-08-30 DIAGNOSIS — E43 Unspecified severe protein-calorie malnutrition: Secondary | ICD-10-CM | POA: Diagnosis not present

## 2022-08-30 DIAGNOSIS — N1832 Chronic kidney disease, stage 3b: Secondary | ICD-10-CM | POA: Diagnosis not present

## 2022-08-30 DIAGNOSIS — J4489 Other specified chronic obstructive pulmonary disease: Secondary | ICD-10-CM | POA: Diagnosis not present

## 2022-08-30 DIAGNOSIS — J9611 Chronic respiratory failure with hypoxia: Secondary | ICD-10-CM

## 2022-08-30 DIAGNOSIS — J439 Emphysema, unspecified: Secondary | ICD-10-CM

## 2022-08-30 MED ORDER — BREZTRI AEROSPHERE 160-9-4.8 MCG/ACT IN AERO: 2.0000 | INHALATION_SPRAY | Freq: Two times a day (BID) | RESPIRATORY_TRACT | 5 refills | Status: DC

## 2022-08-30 NOTE — Patient Instructions (Addendum)
Please schedule follow up scheduled with myself in 6 months.  If my schedule is not open yet, we will contact you with a reminder closer to that time. Please call [phone] if you haven't heard from [place] a month before.   Stop Incruse and the Breo once you finish them.  I am switching you to an inhaler called Breztri which contains all the medications you need for COPD.   2 puffs in the morning, 2 puffs at night, gargle after use.   Call me sooner if any issues or concerns with the breathing. If this medication is to expensive let me know and we can go back to the old inhalers.

## 2022-08-30 NOTE — Progress Notes (Signed)
Caitlin Oliver    [id number]    25-Jan-1941  Primary Care Physician:Kaplan, Isidor Holts., PA-C Date of Appointment: 08/30/2022 Established Patient Visit  Chief complaint:   Chief Complaint  Patient presents with   Follow-up    Pt states she is about the same since last office visit.     HPI: Caitlin Oliver is a 82 y.o. woman with past medical history of COPD on home oxygen 3LNC.  Interval Updates: Last seen by me for initial visit in 2022. Saw TP in the interim. Here for follow up after 5 months.   Had covid infection in [facility] 2024. Never had a test but was told by paramedic this is probably what she had. Had fever, chills, aches. This has mostly resolved.  Did not receive any anti-viral therapy.   October 2023 hospitalized for copd exacerbation for 4 days.   She is now on [facility] and monitors oxygen levels at home.  Takes albuterol treatments twice daily. She is also on incruse and breo.   Continues to live independently. She has someone who comes three mornings a week to go to help with things around the house. Was driven by her today to the appointment.   I have reviewed the patient's family social and past medical history and updated as appropriate.   Past Medical History:  Diagnosis Date   Anxiety    Arthritis    Cancer (HCC)    lung cancer   Carotid artery disease (HCC)    Cataracts, bilateral    CHF (congestive heart failure) (HCC)    Claudication (HCC)    COPD (chronic obstructive pulmonary disease) (HCC)    Diabetes mellitus    Dizzy    Family history of adverse reaction to anesthesia    Pt nephew has PONV   GERD (gastroesophageal reflux disease)    Headache    History of kidney stones    Hypercholesteremia    Hypertension    Peripheral arterial disease (HCC)    bilateral iliac artery stenosis by angiography   Pneumonia    Stomach ulcer    Stroke (HCC)    Tobacco abuse    Wears glasses     Past Surgical History:  Procedure Laterality  Date   ABDOMINAL AORTIC ENDOVASCULAR STENT GRAFT Right 06/19/2019   Procedure: INNOMINATE STENT, RIGHT CAROTID STENT, RIGHT SUBCLAVIAN STENT AND CAROTID CUTDOWN;  Surgeon: Cephus Shelling, MD;  Location: [facility] OR;  Service: Vascular;  Laterality: Right;   ABDOMINAL HYSTERECTOMY     APPENDECTOMY     BREAST REDUCTION SURGERY     CAROTID ANGIOGRAM N/A 02/25/2013   Procedure: CAROTID ANGIOGRAM;  Surgeon: Runell Gess, MD;  Location: [facility] CATH LAB;  Service: Cardiovascular;  Laterality: N/A;   ENDARTERECTOMY Right 03/06/2013   Procedure: ENDARTERECTOMY CAROTID-RIGHT;  Surgeon: Nada Libman, MD;  Location: [facility] OR;  Service: Vascular;  Laterality: Right;   ENDARTERECTOMY Left 05/07/2013   Procedure: LEFT CAROTID ARTERY ENDARTERECTOMY WITH VASCU-GUARD PATCH ANGIOPLASTY ;  Surgeon: Nada Libman, MD;  Location: [facility] OR;  Service: Vascular;  Laterality: Left;   INTRAOPERATIVE ARTERIOGRAM N/A 06/19/2019   Procedure: Sharlette Dense;  Surgeon: Cephus Shelling, MD;  Location: [facility] OR;  Service: Vascular;  Laterality: N/A;   IR ANGIO INTRA EXTRACRAN SEL COM CAROTID INNOMINATE UNI R MOD SED  05/14/2019   IR ANGIO VERTEBRAL SEL SUBCLAVIAN INNOMINATE BILAT MOD SED  05/14/2019   LOBECTOMY Left 02/03/2016   Procedure: LEFT  UPPER LOBECTOMY;  Surgeon: Loreli Slot, MD;  Location: [facility] OR;  Service: Thoracic;  Laterality: Left;   LOWER EXTREMITY ANGIOGRAM N/A 02/25/2013   Procedure: LOWER EXTREMITY ANGIOGRAM;  Surgeon: Runell Gess, MD;  Location: [facility] CATH LAB;  Service: Cardiovascular;  Laterality: N/A;   LOWER EXTREMITY ANGIOGRAM N/A 07/27/2013   Procedure: LOWER EXTREMITY ANGIOGRAM;  Surgeon: Runell Gess, MD;  Location: [facility] CATH LAB;  Service: Cardiovascular;  Laterality: N/A;   PATCH ANGIOPLASTY Right 03/06/2013   Procedure: PATCH ANGIOPLASTY of Right Carotid Artery using Vascu-Guard Patch;  Surgeon: Nada Libman, MD;  Location: [facility] OR;  Service: Vascular;  Laterality: Right;   PERIPHERAL  VASCULAR CATHETERIZATION N/A 11/15/2015   Procedure: Aortic Arch Angiography;  Surgeon: Nada Libman, MD;  Location: [facility] INVASIVE CV LAB;  Service: Cardiovascular;  Laterality: N/A;   PERIPHERAL VASCULAR CATHETERIZATION Bilateral 11/15/2015   Procedure: Carotid Angiography;  Surgeon: Nada Libman, MD;  Location: [facility] INVASIVE CV LAB;  Service: Cardiovascular;  Laterality: Bilateral;   PERIPHERAL VASCULAR CATHETERIZATION Left 11/15/2015   Procedure: Upper Extremity Angiography;  Surgeon: Nada Libman, MD;  Location: [facility] INVASIVE CV LAB;  Service: Cardiovascular;  Laterality: Left;   PERIPHERAL VASCULAR CATHETERIZATION Left 11/15/2015   Procedure: Peripheral Vascular Intervention;  Surgeon: Nada Libman, MD;  Location: [facility] INVASIVE CV LAB;  Service: Cardiovascular;  Laterality: Left;  subclavian    RIGHT HEART CATH N/A 06/16/2019   Procedure: RIGHT HEART CATH;  Surgeon: Dolores Patty, MD;  Location: [facility] INVASIVE CV LAB;  Service: Cardiovascular;  Laterality: N/A;   RIGHT/LEFT HEART CATH AND CORONARY ANGIOGRAPHY N/A 11/20/2018   Procedure: RIGHT/LEFT HEART CATH AND CORONARY ANGIOGRAPHY;  Surgeon: Yvonne Kendall, MD;  Location: [facility] INVASIVE CV LAB;  Service: Cardiovascular;  Laterality: N/A;   SALIVARY GLAND SURGERY     scar tissue removed from left saliva glad   TIBIA IM NAIL INSERTION Right 05/23/2021   Procedure: INTRAMEDULLARY (IM) NAIL TIBIAL;  Surgeon: Myrene Galas, MD;  Location: [facility] OR;  Service: Orthopedics;  Laterality: Right;   TUBAL LIGATION     ULTRASOUND GUIDANCE FOR VASCULAR ACCESS Right 06/19/2019   Procedure: Ultrasound Guidance For Vascular Access;  Surgeon: Cephus Shelling, MD;  Location: [facility] OR;  Service: Vascular;  Laterality: Right;   VIDEO ASSISTED THORACOSCOPY (VATS)/WEDGE RESECTION Left 02/03/2016   Procedure: VIDEO ASSISTED THORACOSCOPY;  Surgeon: Loreli Slot, MD;  Location: [facility] OR;  Service: Thoracic;  Laterality: Left;    Family History  Problem Relation Age  of Onset   Stroke Mother    Hypertension Mother    Kidney failure Father    Lung disease Neg Hx     Social History   Occupational History   Not on file  Tobacco Use   Smoking status: Former    Packs/day: 1.50    Years: 56.00    Additional pack years: 0.00    Total pack years: 84.00    Types: Cigarettes    Quit date: 07/07/2013    Years since quitting: 9.1   Smokeless tobacco: Former    Quit date: 02/25/2013  Vaping Use   Vaping Use: Some days  Substance and Sexual Activity   Alcohol use: No   Drug use: No   Sexual activity: Never     Physical Exam: Blood pressure 108/64, pulse 63, height 5\' 4"  (1.626 m), weight 163 lb (73.9 kg), SpO2 97 %.  Gen:      No acute distress, hoarse voice, short of breath  ENT:  no nasal polyps, mucus membranes moist on nasal cannula Lungs:   diminished, increased RR, no wheeze CV:         Regular rate and rhythm; no murmurs, rubs, or gallops.  No pedal edema   Data Reviewed: Imaging: I have personally reviewed the chest xray apirl 2024 consistent with chronic copd.   PFTs:     Latest Ref Rng & Units 02/19/2019    3:21 PM 12/15/2015   10:48 AM  PFT Results  FVC-Pre L 2.03  2.49   FVC-Predicted Pre % 75  88   FVC-Post L  2.69   FVC-Predicted Post %  95   Pre FEV1/FVC % % 62  71   Post FEV1/FCV % %  72   FEV1-Pre L 1.25  1.77   FEV1-Predicted Pre % 62  84   FEV1-Post L  1.92   DLCO uncorrected ml/min/mmHg 9.75  16.88   DLCO UNC% % 51  69   DLVA Predicted % 63  80   TLC L 5.65  5.76   TLC % Predicted % 111  114   RV % Predicted % 158  144    I have personally reviewed the patient's PFTs and moderate airflow limitation with evidence of hyperinflation and air trapping.  Labs:  Immunization status: Immunization History  Administered Date(s) Administered   Covid-19, Mrna,Vaccine(Spikevax)70yrs and older 03/26/2022   Fluad Quad(high Dose 65+) 02/07/2022   Influenza, High Dose Seasonal PF 01/17/2016, 01/28/2017, 01/16/2018,  01/30/2019, 03/08/2020, 01/09/2021, 02/07/2022   Influenza-Unspecified 01/31/2016   PFIZER Comirnaty(Gray Top)Covid-19 Tri-Sucrose Vaccine 10/12/2020   PFIZER(Purple Top)SARS-COV-2 Vaccination 10/08/2019, 10/28/2019   PNEUMOCOCCAL CONJUGATE-20 03/26/2022   Pfizer Covid-19 Vaccine Bivalent Booster 19yrs & up 03/30/2021   Pneumococcal Conjugate-13 02/09/2015   Pneumococcal Polysaccharide-23 05/08/2013   Tdap 05/05/2021   Zoster Recombinat (Shingrix) 10/21/2017   Zoster, Live 05/14/2011    External Records Personally Reviewed: pulmonary  Assessment:  Moderate COPD FEV1 62% of predicted (worsening PFTs from 2017 to 2020) with worsening symptoms.  Chronic respiratory failure on 3LNC   Plan/Recommendations: Stop Incruse and the Breo once you finish them.  I am switching you to an inhaler called Breztri which contains all the medications you need for COPD.   2 puffs in the morning, 2 puffs at night, gargle after use.   Call me sooner if any issues or concerns with the breathing. If this medication is to expensive let me know and we can go back to the old inhalers.   Outside the window for lung cancer screening based on age.   Return to Care: Return in about 6 months (around 03/02/2023).   Durel Salts, MD Pulmonary and Critical Care Medicine [facility] Office:[phone]

## 2022-09-24 ENCOUNTER — Ambulatory Visit (HOSPITAL_BASED_OUTPATIENT_CLINIC_OR_DEPARTMENT_OTHER)
Admission: RE | Admit: 2022-09-24 | Discharge: 2022-09-24 | Disposition: A | Discharge status: Home / Self Care | Payer: PPO | Source: Ambulatory Visit | Attending: Cardiovascular Disease | Admitting: Cardiovascular Disease

## 2022-09-24 ENCOUNTER — Ambulatory Visit (HOSPITAL_COMMUNITY)
Admission: RE | Admit: 2022-09-24 | Discharge: 2022-09-24 | Disposition: A | Discharge status: Home / Self Care | Payer: PPO | Source: Ambulatory Visit | Attending: Internal Medicine | Admitting: Internal Medicine

## 2022-09-24 DIAGNOSIS — I10 Essential (primary) hypertension: Secondary | ICD-10-CM | POA: Insufficient documentation

## 2022-09-24 DIAGNOSIS — I708 Atherosclerosis of other arteries: Secondary | ICD-10-CM | POA: Diagnosis not present

## 2022-09-24 DIAGNOSIS — Z95828 Presence of other vascular implants and grafts: Secondary | ICD-10-CM | POA: Insufficient documentation

## 2022-09-24 DIAGNOSIS — Z720 Tobacco use: Secondary | ICD-10-CM

## 2022-09-24 DIAGNOSIS — Z9889 Other specified postprocedural states: Secondary | ICD-10-CM | POA: Insufficient documentation

## 2022-09-24 DIAGNOSIS — I739 Peripheral vascular disease, unspecified: Secondary | ICD-10-CM | POA: Insufficient documentation

## 2022-09-24 DIAGNOSIS — E782 Mixed hyperlipidemia: Secondary | ICD-10-CM

## 2022-09-25 LAB — VAS US ABI WITH/WO TBI
Left ABI: 1.06
Right ABI: 1.07

## 2022-09-26 ENCOUNTER — Other Ambulatory Visit (HOSPITAL_COMMUNITY): Payer: Self-pay | Admitting: Internal Medicine

## 2022-09-26 DIAGNOSIS — I5032 Chronic diastolic (congestive) heart failure: Secondary | ICD-10-CM

## 2022-10-13 ENCOUNTER — Other Ambulatory Visit (HOSPITAL_COMMUNITY): Payer: Self-pay | Admitting: Internal Medicine

## 2022-10-13 DIAGNOSIS — I5032 Chronic diastolic (congestive) heart failure: Secondary | ICD-10-CM

## 2022-11-27 ENCOUNTER — Other Ambulatory Visit (HOSPITAL_COMMUNITY): Payer: Self-pay | Admitting: Cardiovascular Disease

## 2022-11-27 DIAGNOSIS — I779 Disorder of arteries and arterioles, unspecified: Secondary | ICD-10-CM

## 2022-11-27 DIAGNOSIS — I739 Peripheral vascular disease, unspecified: Secondary | ICD-10-CM

## 2023-01-25 ENCOUNTER — Other Ambulatory Visit: Payer: Self-pay | Admitting: Internal Medicine

## 2023-01-29 ENCOUNTER — Telehealth: Payer: Self-pay | Admitting: Internal Medicine

## 2023-01-29 NOTE — Telephone Encounter (Signed)
Patient states needs order for machine to check oxygen. Patient phone number is [phone].

## 2023-02-03 NOTE — Telephone Encounter (Signed)
Called patient but she did not answer. Left message for her to call the office tomorrow after 8am.

## 2023-02-27 ENCOUNTER — Ambulatory Visit: Payer: PPO | Admitting: Internal Medicine

## 2023-02-27 ENCOUNTER — Encounter: Payer: Self-pay | Admitting: Internal Medicine

## 2023-02-27 VITALS — BP 110/62 | HR 74 | Ht 64.0 in | Wt 160.0 lb

## 2023-02-27 DIAGNOSIS — J4489 Other specified chronic obstructive pulmonary disease: Secondary | ICD-10-CM | POA: Diagnosis not present

## 2023-02-27 DIAGNOSIS — J9611 Chronic respiratory failure with hypoxia: Secondary | ICD-10-CM | POA: Diagnosis not present

## 2023-02-27 DIAGNOSIS — R040 Epistaxis: Secondary | ICD-10-CM | POA: Diagnosis not present

## 2023-02-27 DIAGNOSIS — J439 Emphysema, unspecified: Secondary | ICD-10-CM

## 2023-02-27 MED ORDER — MISC. DEVICES KIT: PACK | 0 refills | Status: DC

## 2023-02-27 NOTE — Patient Instructions (Signed)
It was a pleasure to see you today!  Please schedule follow up scheduled with myself in 6 months.  If my schedule is not open yet, we will contact you with a reminder closer to that time. Please call [phone] if you haven't heard from [place] a month before, and always call us sooner if issues or concerns arise. You can also send [place] a message through MyChart, but but aware that this is not to be used for urgent issues and it may take up to 5-7 days to receive a reply. Please be aware that you will likely be able to view your results before I have a chance to respond to them. Please give [place] 5 business days to respond to any non-urgent results.    Glad your breathing is doing better.  Continue the breztri 2 puffs twice daily and gargle after use.  Continue the albuterol inhaler as needed.  I have written a prescription for the puls-oximeter. You will have to ask the insurance company how to get that reimbursed.   Continue nasal saline and you can put the saline gel inside your nose with a q-tip to help with nose bleeds.   Call me sooner if you need me.

## 2023-02-27 NOTE — Progress Notes (Signed)
Caitlin Oliver    [id number]    [birth year]  Primary Care Physician:Kaplan, Isidor Holts., PA-C Date of Appointment: 02/27/2023 Established Patient Visit  Chief complaint:   Chief Complaint  Patient presents with   Follow-up    COPD F/U visit. Pt request Rx for O2 Meter.     HPI: Caitlin Oliver is a 82 y.o. woman with past medical history of COPD on home oxygen 3LNC.  Interval Updates: Here for follow up. Needs prescription for puls oximeter.  She switched to breztri 6 months ago.  Feels much better on breztri.   No interval hospitalizations or ED visits.    Has a headache today which she has had for "2 years."   Having nose bleeds with oxygen therapy that are recurrent. Using nasal saline to help with the dryness. Putting saline gel outside the nose.   I have reviewed the patient's family social and past medical history and updated as appropriate.   Past Medical History:  Diagnosis Date   Anxiety    Arthritis    Cancer (HCC)    lung cancer   Carotid artery disease (HCC)    Cataracts, bilateral    CHF (congestive heart failure) (HCC)    Claudication (HCC)    COPD (chronic obstructive pulmonary disease) (HCC)    Diabetes mellitus    Dizzy    Family history of adverse reaction to anesthesia    Pt nephew has PONV   GERD (gastroesophageal reflux disease)    Headache    History of kidney stones    Hypercholesteremia    Hypertension    Peripheral arterial disease (HCC)    bilateral iliac artery stenosis by angiography   Pneumonia    Stomach ulcer    Stroke (HCC)    Tobacco abuse    Wears glasses     Past Surgical History:  Procedure Laterality Date   ABDOMINAL AORTIC ENDOVASCULAR STENT GRAFT Right 06/19/2019   Procedure: INNOMINATE STENT, RIGHT CAROTID STENT, RIGHT SUBCLAVIAN STENT AND CAROTID CUTDOWN;  Surgeon: Cephus Shelling, MD;  Location: [facility] OR;  Service: Vascular;  Laterality: Right;   ABDOMINAL HYSTERECTOMY     APPENDECTOMY     BREAST  REDUCTION SURGERY     CAROTID ANGIOGRAM N/A 02/25/2013   Procedure: CAROTID ANGIOGRAM;  Surgeon: Runell Gess, MD;  Location: [facility] CATH LAB;  Service: Cardiovascular;  Laterality: N/A;   ENDARTERECTOMY Right 03/06/2013   Procedure: ENDARTERECTOMY CAROTID-RIGHT;  Surgeon: Nada Libman, MD;  Location: [facility] OR;  Service: Vascular;  Laterality: Right;   ENDARTERECTOMY Left 05/07/2013   Procedure: LEFT CAROTID ARTERY ENDARTERECTOMY WITH VASCU-GUARD PATCH ANGIOPLASTY ;  Surgeon: Nada Libman, MD;  Location: [facility] OR;  Service: Vascular;  Laterality: Left;   INTRAOPERATIVE ARTERIOGRAM N/A 06/19/2019   Procedure: Sharlette Dense;  Surgeon: Cephus Shelling, MD;  Location: [facility] OR;  Service: Vascular;  Laterality: N/A;   IR ANGIO INTRA EXTRACRAN SEL COM CAROTID INNOMINATE UNI R MOD SED  05/14/2019   IR ANGIO VERTEBRAL SEL SUBCLAVIAN INNOMINATE BILAT MOD SED  05/14/2019   LOBECTOMY Left 02/03/2016   Procedure: LEFT UPPER LOBECTOMY;  Surgeon: Loreli Slot, MD;  Location: [facility] OR;  Service: Thoracic;  Laterality: Left;   LOWER EXTREMITY ANGIOGRAM N/A 02/25/2013   Procedure: LOWER EXTREMITY ANGIOGRAM;  Surgeon: Runell Gess, MD;  Location: [facility] CATH LAB;  Service: Cardiovascular;  Laterality: N/A;   LOWER EXTREMITY ANGIOGRAM N/A 07/27/2013   Procedure: LOWER EXTREMITY  ANGIOGRAM;  Surgeon: Runell Gess, MD;  Location: [facility] CATH LAB;  Service: Cardiovascular;  Laterality: N/A;   PATCH ANGIOPLASTY Right 03/06/2013   Procedure: PATCH ANGIOPLASTY of Right Carotid Artery using Vascu-Guard Patch;  Surgeon: Nada Libman, MD;  Location: [facility] OR;  Service: Vascular;  Laterality: Right;   PERIPHERAL VASCULAR CATHETERIZATION N/A 11/15/2015   Procedure: Aortic Arch Angiography;  Surgeon: Nada Libman, MD;  Location: [facility] INVASIVE CV LAB;  Service: Cardiovascular;  Laterality: N/A;   PERIPHERAL VASCULAR CATHETERIZATION Bilateral 11/15/2015   Procedure: Carotid Angiography;  Surgeon: Nada Libman, MD;  Location:  [facility] INVASIVE CV LAB;  Service: Cardiovascular;  Laterality: Bilateral;   PERIPHERAL VASCULAR CATHETERIZATION Left 11/15/2015   Procedure: Upper Extremity Angiography;  Surgeon: Nada Libman, MD;  Location: [facility] INVASIVE CV LAB;  Service: Cardiovascular;  Laterality: Left;   PERIPHERAL VASCULAR CATHETERIZATION Left 11/15/2015   Procedure: Peripheral Vascular Intervention;  Surgeon: Nada Libman, MD;  Location: [facility] INVASIVE CV LAB;  Service: Cardiovascular;  Laterality: Left;  subclavian    RIGHT HEART CATH N/A 06/16/2019   Procedure: RIGHT HEART CATH;  Surgeon: Dolores Patty, MD;  Location: [facility] INVASIVE CV LAB;  Service: Cardiovascular;  Laterality: N/A;   RIGHT/LEFT HEART CATH AND CORONARY ANGIOGRAPHY N/A 11/20/2018   Procedure: RIGHT/LEFT HEART CATH AND CORONARY ANGIOGRAPHY;  Surgeon: Yvonne Kendall, MD;  Location: [facility] INVASIVE CV LAB;  Service: Cardiovascular;  Laterality: N/A;   SALIVARY GLAND SURGERY     scar tissue removed from left saliva glad   TIBIA IM NAIL INSERTION Right 05/23/2021   Procedure: INTRAMEDULLARY (IM) NAIL TIBIAL;  Surgeon: Myrene Galas, MD;  Location: [facility] OR;  Service: Orthopedics;  Laterality: Right;   TUBAL LIGATION     ULTRASOUND GUIDANCE FOR VASCULAR ACCESS Right 06/19/2019   Procedure: Ultrasound Guidance For Vascular Access;  Surgeon: Cephus Shelling, MD;  Location: [facility] OR;  Service: Vascular;  Laterality: Right;   VIDEO ASSISTED THORACOSCOPY (VATS)/WEDGE RESECTION Left 02/03/2016   Procedure: VIDEO ASSISTED THORACOSCOPY;  Surgeon: Loreli Slot, MD;  Location: [facility] OR;  Service: Thoracic;  Laterality: Left;    Family History  Problem Relation Age of Onset   Stroke Mother    Hypertension Mother    Kidney failure Father    Lung disease Neg Hx     Social History   Occupational History   Not on file  Tobacco Use   Smoking status: Former    Current packs/day: 0.00    Average packs/day: 1.5 packs/day for 56.0 years (84.0 ttl pk-yrs)    Types:  Cigarettes    Start date: 07/07/1957    Quit date: 07/07/2013    Years since quitting: 9.6   Smokeless tobacco: Former    Quit date: 02/25/2013  Vaping Use   Vaping status: Some Days  Substance and Sexual Activity   Alcohol use: No   Drug use: No   Sexual activity: Never     Physical Exam: Blood pressure 110/62, pulse 74, height 5\' 4"  (1.626 m), weight 160 lb (72.6 kg), SpO2 95%.  Gen:      Elderly, frail, no distress ENT:  no nose bleeds, on nasal cannula Lungs: ctab no wheeze CV:         RRR no mrg   Data Reviewed: Imaging: I have personally reviewed the chest xray apirl 2024 consistent with chronic copd.   PFTs:     Latest Ref Rng & Units 02/19/2019    3:21 PM 12/15/2015   10:48  AM  PFT Results  FVC-Pre L 2.03  2.49   FVC-Predicted Pre % 75  88   FVC-Post L  2.69   FVC-Predicted Post %  95   Pre FEV1/FVC % % 62  71   Post FEV1/FCV % %  72   FEV1-Pre L 1.25  1.77   FEV1-Predicted Pre % 62  84   FEV1-Post L  1.92   DLCO uncorrected ml/min/mmHg 9.75  16.88   DLCO UNC% % 51  69   DLVA Predicted % 63  80   TLC L 5.65  5.76   TLC % Predicted % 111  114   RV % Predicted % 158  144    I have personally reviewed the patient's PFTs and moderate airflow limitation with evidence of hyperinflation and air trapping.  Labs:  Immunization status: Immunization History  Administered Date(s) Administered   Fluad Quad(high Dose 65+) 02/07/2022   Influenza, High Dose Seasonal PF 01/17/2016, 01/28/2017, 01/16/2018, 01/30/2019, 03/08/2020, 01/09/2021, 02/07/2022   Influenza-Unspecified 01/31/2016   Moderna Covid-19 Fall Seasonal Vaccine 100yrs & older 03/26/2022   PFIZER Comirnaty(Gray Top)Covid-19 Tri-Sucrose Vaccine 10/12/2020   PFIZER(Purple Top)SARS-COV-2 Vaccination 10/08/2019, 10/28/2019   PNEUMOCOCCAL CONJUGATE-20 03/26/2022   Pfizer Covid-19 Vaccine Bivalent Booster 60yrs & up 03/30/2021   Pneumococcal Conjugate-13 02/09/2015   Pneumococcal Polysaccharide-23  05/08/2013   Tdap 05/05/2021   Zoster Recombinant(Shingrix) 10/21/2017   Zoster, Live 05/14/2011    External Records Personally Reviewed: pulmonary  Assessment:  Moderate COPD FEV1 62% of predicted (worsening PFTs from 2017 to 2020) with worsening symptoms.  Chronic respiratory failure on 3LNC Nose bleeds - adverse effect of chronic oxygen therapy  Plan/Recommendations:  Glad your breathing is doing better.  Continue the breztri 2 puffs twice daily and gargle after use.  Continue the albuterol inhaler as needed.  I have written a prescription for the puls-oximeter. You will have to ask the insurance company how to get that reimbursed.   Continue nasal saline and you can put the saline gel inside your nose with a q-tip to help with nose bleeds.   Call me sooner if you need me.   Outside the window for lung cancer screening based on age.   Return to Care: Return in about 6 months (around 08/27/2023).   Durel Salts, MD Pulmonary and Critical Care Medicine [facility] [phone]

## 2023-04-19 ENCOUNTER — Ambulatory Visit: Payer: PPO | Attending: Nurse Practitioner | Admitting: Emergency Medicine

## 2023-04-19 ENCOUNTER — Encounter: Payer: Self-pay | Admitting: Emergency Medicine

## 2023-04-19 VITALS — BP 114/64 | HR 72 | Ht 64.0 in | Wt 156.4 lb

## 2023-04-19 DIAGNOSIS — I739 Peripheral vascular disease, unspecified: Secondary | ICD-10-CM

## 2023-04-19 DIAGNOSIS — E782 Mixed hyperlipidemia: Secondary | ICD-10-CM

## 2023-04-19 DIAGNOSIS — I272 Pulmonary hypertension, unspecified: Secondary | ICD-10-CM | POA: Diagnosis not present

## 2023-04-19 DIAGNOSIS — I2510 Atherosclerotic heart disease of native coronary artery without angina pectoris: Secondary | ICD-10-CM

## 2023-04-19 DIAGNOSIS — J411 Mucopurulent chronic bronchitis: Secondary | ICD-10-CM

## 2023-04-19 DIAGNOSIS — I5032 Chronic diastolic (congestive) heart failure: Secondary | ICD-10-CM

## 2023-04-19 DIAGNOSIS — I10 Essential (primary) hypertension: Secondary | ICD-10-CM

## 2023-04-19 DIAGNOSIS — I6523 Occlusion and stenosis of bilateral carotid arteries: Secondary | ICD-10-CM

## 2023-04-19 NOTE — Patient Instructions (Signed)
 Medication Instructions:  Continue current medications *If you need a refill on your cardiac medications before your next appointment, please call your pharmacy*   Lab Work: none If you have labs (blood work) drawn today and your tests are completely normal, you will receive your results only by: MyChart Message (if you have MyChart) OR A paper copy in the mail If you have any lab test that is abnormal or we need to change your treatment, we will call you to review the results.   Testing/Procedures: none   Follow-Up: At [facility], you and your health needs are our priority.  As part of our continuing mission to provide you with exceptional heart care, we have created designated Provider Care Teams.  These Care Teams include your primary Cardiologist (physician) and Advanced Practice Providers (APPs -  Physician Assistants and Nurse Practitioners) who all work together to provide you with the care you need, when you need it.  We recommend signing up for the patient portal called "MyChart".  Sign up information is provided on this After Visit Summary.  MyChart is used to connect with patients for Virtual Visits (Telemedicine).  Patients are able to view lab/test results, encounter notes, upcoming appointments, etc.  Non-urgent messages can be sent to your provider as well.   To learn more about what you can do with MyChart, go to [email or web address].    Your next appointment:   6 month(s)  Provider:   Nanetta Batty, MD     Other Instructions none

## 2023-04-19 NOTE — Progress Notes (Signed)
 Cardiology Office Note:    Date:  04/19/2023  ID:  Caitlin Oliver, DOB 10/22/1940, MRN [id number] PCP: Debrah Josette MOHR PA-C  [facility] Providers Cardiologist:  Dorn Lesches, MD       Patient Profile:      Caitlin Oliver is a 83 year old female with visit pertinent history of chronic diastolic heart failure, CVA, carotid artery stenosis s/p R CEA in 2014, L CEA in 2015, HTN, HLD, pulmonary hypertension, chronic respiratory failure on 2L O2, lung cancer s/p left upper lobectomy 2017, COPD, PAD, chronic dizziness, suspected OSA, T2DM, CKD, GERD, anxiety.  Admitted 06/2018 with difficulty walking and ataxia. Dx with R precentral gyrus 5mm ischemic stroke. Discharged on O2, ASA and plavix .   Admitted on 10/2018 with SOB and Cp. R/LHC in 11/2018 revealed mild to moderate nonobstructive CAD, severe pulmonary hypertension, moderate to severely elevated left and right heart filling pressures.    She has a history of carotid artery stenosis s/p bilateral carotid endarterectomy, innominate artery stenting as well as subclavian artery stenting (follows with Dr. Vascular surgery) as well as iliac artery stenting per Dr. Lesches. She has followed w/ Dr. Bensimhon in the advanced heart failure clinic for pulmonary hypertension but has not been seen in over x1 year.  She was hospitalized in October 2023 in the setting of acute chronic hypoxic respiratory failure and COPD exacerbation.  Echocardiogram at that time showed 60-65% EF, normal LV function, no RWMA, grade 1 DD, moderate reduced RV systolic function, mildly enlarged RV, no significant valvular abnormalities.  She was last seen in clinic by Dr. Lesches on 08/22/2022 it was noted that she was doing well time.  Carotid ultrasound and [place] US  on 09/24/2022 shows patent carotid and illiac stents.       History of Present Illness:  Discussed the use of AI scribe software for clinical note transcription with the patient, who gave verbal consent to  proceed.  Caitlin Oliver is a 83 y.o. female who returns for six month follow up for CAD, DHF, HTN, PAD.   Today the patient reports feeling 'great' overall and deny any chest pain or pressure. She is on 3.5 liters of oxygen  continuously due to COPD, a consequence of a 56-year smoking history. She has not noticed any worsening of their baseline shortness of breath, except for an episode a couple of weeks ago when they 'was fighting for every breath.' She increased their oxygen  level themselves, which seemed to resolve the issue. She denies any weight gain and have been gradually losing weight. She denies any pain when walking, attributing any discomfort to lack of exercise and age. She also report occasional nosebleeds, which they attribute to their oxygen  use. She notes the only of exercise she gets is by walking in the grocery store, she mostly watches TV all day. She notes complete adherence to medication regimen.      Review of Systems  Constitutional: Negative for weight gain and weight loss.  Cardiovascular:  Positive for dyspnea on exertion. Negative for chest pain, claudication, irregular heartbeat, leg swelling, near-syncope, orthopnea, palpitations, paroxysmal nocturnal dyspnea and syncope.  Respiratory:  Positive for shortness of breath. Negative for cough and hemoptysis.   Gastrointestinal:  Negative for abdominal pain, hematochezia and melena.  Genitourinary:  Negative for hematuria.  Neurological:  Negative for dizziness and light-headedness.     See HPI    Studies Reviewed:       Echocardiogram 02/02/2022 1. Left ventricular ejection fraction, by estimation, is  60 to 65%. The  left ventricle has normal function. The left ventricle has no regional  wall motion abnormalities. Left ventricular diastolic parameters are  consistent with Grade I diastolic  dysfunction (impaired relaxation).   2. Right ventricular systolic function is moderately reduced. The right  ventricular size  is mildly enlarged.   3. The mitral valve is normal in structure. Trivial mitral valve  regurgitation.   4. The aortic valve is normal in structure. Aortic valve regurgitation is  not visualized.   Cardiac catheterization 11/20/2018 Mild to moderate, non-obstructive coronary artery disease. Severe pulmonary hypertension. Moderately to severely elevated left and right heart filling pressures. Mildly reduced Fick cardiac output/index. Severe right brachiocephalic and subclavian artery stenoses with combined gradient of ~50 mmHg and possible reversal of flow in the right vertebral artery (subclavian steal).   Recommendations: Medical therapy and risk factor modification to prevent progression of coronary artery disease. Continue gentle diuresis, as blood pressure tolerates.  Consultation of advanced heart failure team may be helpful. Continue outpatient vascular follow-up, including monitoring of significant right brachiocephalic/subclavian artery stenoses. Diagnostic Dominance: Right  Risk Assessment/Calculations:             Physical Exam:   VS:  BP 114/64 (BP Location: Left Arm, Patient Position: Sitting, Cuff Size: Normal)   Pulse 72   Ht 5' 4 (1.626 m)   Wt 156 lb 6.4 oz (70.9 kg)   SpO2 94%   BMI 26.85 kg/m    Wt Readings from Last 3 Encounters:  04/19/23 156 lb 6.4 oz (70.9 kg)  02/27/23 160 lb (72.6 kg)  08/30/22 163 lb (73.9 kg)    Constitutional:      Appearance: Normal and healthy appearance.  HENT:     Head: Normocephalic.  Neck:     Vascular: JVD normal.  Pulmonary:     Effort: Pulmonary effort is normal.     Breath sounds: Normal breath sounds.  Chest:     Chest wall: Not tender to palpatation.  Cardiovascular:     PMI at left midclavicular line. Normal rate. Regular rhythm. Normal S1. Normal S2.      Murmurs: There is no murmur.     No gallop.  No click. No rub.  Pulses:    Intact distal pulses.  Edema:    Peripheral edema absent.  Musculoskeletal:  Normal range of motion.     Cervical back: Normal range of motion and neck supple. Skin:    General: Skin is warm and dry.  Neurological:     General: No focal deficit present.     Mental Status: Alert, oriented to person, place, and time and oriented to person, place and time.  Psychiatric:        Attention and Perception: Attention and perception normal.        Mood and Affect: Mood normal.        Behavior: Behavior is cooperative.        Thought Content: Thought content normal.        Assessment and Plan:  Essential Hypertension  -Blood pressure today is 114/64 and under good control. Continue bisoprolol  5mg  daily  Coronary artery disease / Hyperlipidemia -[facility] 11/22/2018 revealed mild to moderate nonobstructive CAD . EKG today today NSR w/o ST-T wave abnormalities. Most recent Echo stable w/o RWMA. Stable with no anginal symptoms currently or within past six months, no indication for ischemic evaluation at this time. LDL 44 on 03/2023, and under excellent control. Continue Atorvastatin  40mg , Plavix  75mg  daily  Chronic diastolic heart failure  -Echo 89/7976 shows LVEF 60-65%, no RWMA, G1 DD. She is euvolemic and well compensated on exam. Weight is down 7lbs since last visit. No worsening of chronic dyspnea and no leg swelling, orthopnea, PND. SGLT2i has been avoided in the past due to UTI risk.  -Continue Torsemide  80mg  twice daily   COPD / Chronic respiratory failure / Tobacco abuse / H/o lung cancer -Managed by pulmonary. She is on 3.5L [place] at all times. Dyspnea is stable.   Pulmonary HTN -R/LHC in 11/2018 showed severe pulmonary HTN. Has refused sleep study in past and not candidate for selective pulmonary artery vasodilators. Managed by Dr. Bensimhon, overdue for f/u with AHF clinic.   Carotid artery disease  -s/p bilateral carotid endarterectomy in 2014 & 2015, innominate artery stenting as well as bilateral subclavian artery stenting. Managed by [facility]. Currently asymptomatic.  Continue Plavix  75mg  daily  Peripheral artery disease  -s/p iliac artery stenting in 2015 by Dr. Court. Conitnue Plavix  75mg  daily             Dispo:  Return in about 6 months (around 10/17/2023).  Signed, Lum Oliver Louis, NP

## 2023-04-23 ENCOUNTER — Other Ambulatory Visit (HOSPITAL_COMMUNITY): Payer: Self-pay | Admitting: Internal Medicine

## 2023-04-23 DIAGNOSIS — I5032 Chronic diastolic (congestive) heart failure: Secondary | ICD-10-CM

## 2023-05-30 ENCOUNTER — Other Ambulatory Visit: Payer: Self-pay

## 2023-05-30 ENCOUNTER — Emergency Department (HOSPITAL_COMMUNITY): Payer: PPO

## 2023-05-30 ENCOUNTER — Emergency Department (HOSPITAL_COMMUNITY)
Admission: EM | Admit: 2023-05-30 | Discharge: 2023-05-31 | Disposition: A | Discharge status: Home / Self Care | Payer: PPO | Attending: Emergency Medicine | Admitting: Emergency Medicine

## 2023-05-30 DIAGNOSIS — R0602 Shortness of breath: Secondary | ICD-10-CM | POA: Diagnosis present

## 2023-05-30 DIAGNOSIS — E119 Type 2 diabetes mellitus without complications: Secondary | ICD-10-CM | POA: Insufficient documentation

## 2023-05-30 DIAGNOSIS — G43701 Chronic migraine without aura, not intractable, with status migrainosus: Secondary | ICD-10-CM | POA: Insufficient documentation

## 2023-05-30 DIAGNOSIS — I110 Hypertensive heart disease with heart failure: Secondary | ICD-10-CM | POA: Diagnosis not present

## 2023-05-30 DIAGNOSIS — Z7902 Long term (current) use of antithrombotics/antiplatelets: Secondary | ICD-10-CM | POA: Diagnosis not present

## 2023-05-30 DIAGNOSIS — I509 Heart failure, unspecified: Secondary | ICD-10-CM | POA: Diagnosis not present

## 2023-05-30 DIAGNOSIS — J441 Chronic obstructive pulmonary disease with (acute) exacerbation: Secondary | ICD-10-CM | POA: Diagnosis not present

## 2023-05-30 LAB — I-STAT VENOUS BLOOD GAS, ED
Acid-Base Excess: 5 mmol/L — ABNORMAL HIGH (ref 0.0–2.0)
Bicarbonate: 30.9 mmol/L — ABNORMAL HIGH (ref 20.0–28.0)
Calcium, Ion: 1.1 mmol/L — ABNORMAL LOW (ref 1.15–1.40)
HCT: 38 % (ref 36.0–46.0)
Hemoglobin: 12.9 g/dL (ref 12.0–15.0)
O2 Saturation: 66 %
Potassium: 3.7 mmol/L (ref 3.5–5.1)
Sodium: 136 mmol/L (ref 135–145)
TCO2: 32 mmol/L (ref 22–32)
pCO2, Ven: 48.3 mm[Hg] (ref 44–60)
pH, Ven: 7.414 (ref 7.25–7.43)
pO2, Ven: 34 mm[Hg] (ref 32–45)

## 2023-05-30 LAB — COMPREHENSIVE METABOLIC PANEL
ALT: 18 U/L (ref 0–44)
AST: 26 U/L (ref 15–41)
Albumin: 3.5 g/dL (ref 3.5–5.0)
Alkaline Phosphatase: 62 U/L (ref 38–126)
Anion gap: 11 (ref 5–15)
BUN: 24 mg/dL — ABNORMAL HIGH (ref 8–23)
CO2: 31 mmol/L (ref 22–32)
Calcium: 9.1 mg/dL (ref 8.9–10.3)
Chloride: 95 mmol/L — ABNORMAL LOW (ref 98–111)
Creatinine, Ser: 1.39 mg/dL — ABNORMAL HIGH (ref 0.44–1.00)
GFR, Estimated: 38 mL/min — ABNORMAL LOW (ref >60)
Glucose, Bld: 272 mg/dL — ABNORMAL HIGH (ref 70–99)
Potassium: 3.9 mmol/L (ref 3.5–5.1)
Sodium: 137 mmol/L (ref 135–145)
Total Bilirubin: 0.5 mg/dL (ref 0.0–1.2)
Total Protein: 6.2 g/dL — ABNORMAL LOW (ref 6.5–8.1)

## 2023-05-30 LAB — TROPONIN I (HIGH SENSITIVITY)
Troponin I (High Sensitivity): 11 ng/L (ref <18)
Troponin I (High Sensitivity): 11 ng/L (ref <18)

## 2023-05-30 LAB — BRAIN NATRIURETIC PEPTIDE: B Natriuretic Peptide: 100 pg/mL (ref 0.0–100.0)

## 2023-05-30 MED ORDER — PREDNISONE 20 MG PO TABS: 40.0000 mg | ORAL_TABLET | Freq: Every day | ORAL | 0 refills | Status: DC

## 2023-05-30 MED ORDER — METOCLOPRAMIDE HCL 5 MG/ML IJ SOLN
10.0000 mg | Freq: Once | INTRAMUSCULAR | Status: AC
  Administered 2023-05-30: 10 mg via INTRAVENOUS
  Filled 2023-05-30: qty 2

## 2023-05-30 MED ORDER — DEXAMETHASONE SODIUM PHOSPHATE 10 MG/ML IJ SOLN
10.0000 mg | Freq: Once | INTRAMUSCULAR | Status: DC
  Filled 2023-05-30: qty 1

## 2023-05-30 MED ORDER — AMOXICILLIN 500 MG PO CAPS: 500.0000 mg | ORAL_CAPSULE | Freq: Three times a day (TID) | ORAL | 0 refills | Status: DC

## 2023-05-30 MED ORDER — HYDROMORPHONE HCL 1 MG/ML IJ SOLN
0.5000 mg | Freq: Once | INTRAMUSCULAR | Status: AC
  Administered 2023-05-30: 0.5 mg via INTRAVENOUS
  Filled 2023-05-30: qty 1

## 2023-05-30 MED ORDER — MAGNESIUM SULFATE 2 GM/50ML IV SOLN
2.0000 g | Freq: Once | INTRAVENOUS | Status: AC
  Administered 2023-05-30: 2 g via INTRAVENOUS
  Filled 2023-05-30: qty 50

## 2023-05-30 NOTE — ED Provider Notes (Signed)
[place] EMERGENCY DEPARTMENT AT [facility] Provider Note   CSN: [id number] Arrival date & time: 05/30/23  1507     History  Chief Complaint  Patient presents with   Shortness of Breath   Migraine    Caitlin Oliver is a 83 y.o. female.  Patient is an 83 year old female with a history of hypertension, diabetes, PAD with chronic claudication, COPD on 3-1/2 L chronically, GERD, CHF, prior stroke and debilitating migraines that she is now getting Botox treatments for who is presenting today with complaints of shortness of breath and severe headache.  She that her second Botox treatment about a week ago and reports that several days after getting it she developed a much more severe migraine which she reports is a headache all over with light and sound sensitivity.  No nausea or vomiting or fever.  She denies any neck pain.  No visual changes or unilateral numbness or weakness.  Feels like migraines she has had in the past but this 1 is just more intense.  She is also been having worsening shortness of breath over the last 3 or 4 days.  She checks her weight daily and they have not changed.  She has been compliant with her medications and her nebulizers but reports she has had a cough and feels like there is just something she needs to get out.  She was on an antibiotic that finished about 2-1/2 weeks ago and she reports ever since she stopped the antibiotic she is slowly been getting worse.  She denies any abdominal pain, nausea or vomiting.  The history is provided by the patient.  Shortness of Breath Migraine Associated symptoms include shortness of breath.       Home Medications Prior to Admission medications   Medication Sig Start Date End Date Taking? Authorizing Provider  amoxicillin (AMOXIL) 500 MG capsule Take 1 capsule (500 mg total) by mouth 3 (three) times daily. 05/30/23  Yes Epsie Walthall, Alphonzo Lemmings, MD  predniSONE (DELTASONE) 20 MG tablet Take 2 tablets (40 mg total) by  mouth daily. 05/30/23  Yes Gwyneth Sprout, MD  acetaminophen (TYLENOL) 500 MG tablet Take 500 mg by mouth every 6 (six) hours as needed for moderate pain.    [provider]  albuterol (PROVENTIL HFA;VENTOLIN HFA) 108 (90 Base) MCG/ACT inhaler Inhale 2 puffs into the lungs every 6 (six) hours as needed for wheezing or shortness of breath.  07/11/17   [provider]  albuterol (PROVENTIL) (2.5 MG/3ML) 0.083% nebulizer solution Take 2.5 mg by nebulization in the morning and at bedtime.    [provider]  Ascorbic Acid (VITAMIN C) 1000 MG tablet Take 1,000 mg by mouth daily.    [provider]  atorvastatin (LIPITOR) 40 MG tablet Take 1 tablet (40 mg total) by mouth daily. 04/24/22     bisoprolol (ZEBETA) 5 MG tablet TAKE ONE-HALF TABLET BY MOUTH DAILY 08/30/22   Bensimhon, Bevelyn Buckles, MD  BREZTRI AEROSPHERE 160-9-4.8 MCG/ACT AERO Inhale 2 puffs into the lungs in the morning and at bedtime. 01/26/23   Charlott Holler, MD  carboxymethylcellul-glycerin (REFRESH OPTIVE) 0.5-0.9 % ophthalmic solution 1 drop as needed for dry eyes.    [provider]  Cholecalciferol (VITAMIN D3 PO) Take 50 mcg by mouth daily. 1 capsules daily    [provider]  clopidogrel (PLAVIX) 75 MG tablet Take 1 tablet (75 mg total) by mouth daily. 04/24/22     cyclobenzaprine (FLEXERIL) 5 MG tablet Take 1 tablet (5  mg total) by mouth 2 (two) times daily as needed for muscle spasms 04/24/22     docusate sodium (COLACE) 100 MG capsule Take 100 mg by mouth 2 (two) times daily as needed for mild constipation.    [provider]  escitalopram (LEXAPRO) 10 MG tablet Take 1 tablet (10 mg total) by mouth daily. 04/24/22     ferrous sulfate 325 (65 FE) MG tablet Take 1 tablet (325 mg total) by mouth daily with breakfast. 04/24/22     Folic Acid-Vit B6-Vit B12 (FOLBEE) 2.5-25-1 MG TABS tablet Take 1 tablet by mouth daily.    [provider]  linagliptin (TRADJENTA) 5 MG TABS tablet  Take 1 tablet (5 mg total) by mouth daily. 04/24/22     Magnesium 250 MG TABS Take 250 mg by mouth in the morning and at bedtime. 1 in am and 1 at HS.    [provider]  meclizine (ANTIVERT) 25 MG tablet Take 1 tablet (25 mg total) by mouth every morning. 04/24/22     Misc. Devices KIT Please supply puls-oximetry for patient on chronic home oxygen. 02/27/23   Charlott Holler, MD  Multiple Vitamin (MULTIVITAMIN WITH MINERALS) TABS tablet Take 1 tablet by mouth daily. 05/27/21   Levin Erp, MD  naphazoline-glycerin (CLEAR EYES REDNESS) 0.012-0.25 % SOLN 1-2 drops 4 (four) times daily as needed for eye irritation.    [provider]  ondansetron (ZOFRAN) 4 MG tablet Take 4 mg by mouth daily as needed for nausea.    [provider]  ONE TOUCH ULTRA TEST test strip  09/12/16   [provider]  OXYGEN Inhale 2.5 L into the lungs.    [provider]  pantoprazole (PROTONIX) 40 MG tablet Take 1 tablet (40 mg total) by mouth 2 (two) times daily. 04/24/22     polyethylene glycol (MIRALAX / GLYCOLAX) 17 g packet Take 17 g by mouth daily as needed for mild constipation. 12/02/18   [provider]  potassium chloride SA (KLOR-CON M) 20 MEQ tablet TAKE ONE TABLET BY MOUTH EVERY DAY 08/30/22   Bensimhon, Bevelyn Buckles, MD  senna (SENOKOT) 8.6 MG TABS tablet Take 1 tablet (8.6 mg total) by mouth daily. 02/04/22   Evette Georges, MD  Torsemide 40 MG TABS Take 2 tablets by mouth 2 (two) times daily. Patient taking differently: Take 2 tablets by mouth 2 (two) times daily. 20 mg, 2 tabs 06/05/22   Bensimhon, Bevelyn Buckles, MD      Allergies    Fentanyl    Review of Systems   Review of Systems  Respiratory:  Positive for shortness of breath.     Physical Exam Updated Vital Signs BP 132/65   Pulse 95   Temp 97.7 F (36.5 C)   Resp 18   SpO2 96%  Physical Exam Vitals and nursing note reviewed.  Constitutional:      General: She is not in acute distress.     Appearance: She is well-developed.  HENT:     Head: Normocephalic and atraumatic.     Mouth/Throat:     Mouth: Mucous membranes are dry.  Eyes:     Pupils: Pupils are equal, round, and reactive to light.  Cardiovascular:     Rate and Rhythm: Normal rate and regular rhythm.     Heart sounds: Normal heart sounds. No murmur heard.    No friction rub.  Pulmonary:     Effort: Pulmonary effort is normal. Tachypnea present.  Breath sounds: Examination of the right-lower field reveals decreased breath sounds. Examination of the left-lower field reveals decreased breath sounds. Decreased breath sounds present. No wheezing or rales.  Abdominal:     General: Bowel sounds are normal. There is no distension.     Palpations: Abdomen is soft.     Tenderness: There is no abdominal tenderness. There is no guarding or rebound.  Musculoskeletal:        General: No tenderness. Normal range of motion.     Right lower leg: Edema present.     Left lower leg: Edema present.     Comments: Trace edema bilateral ankles  Skin:    General: Skin is warm and dry.     Findings: No rash.  Neurological:     Mental Status: She is alert and oriented to person, place, and time. Mental status is at baseline.     Cranial Nerves: No cranial nerve deficit.     Sensory: No sensory deficit.     Motor: No weakness.  Psychiatric:        Behavior: Behavior normal.     ED Results / Procedures / Treatments   Labs (all labs ordered are listed, but only abnormal results are displayed) Labs Reviewed  COMPREHENSIVE METABOLIC PANEL - Abnormal; Notable for the following components:      Result Value   Chloride 95 (*)    Glucose, Bld 272 (*)    BUN 24 (*)    Creatinine, Ser 1.39 (*)    Total Protein 6.2 (*)    GFR, Estimated 38 (*)    All other components within normal limits  I-STAT VENOUS BLOOD GAS, ED - Abnormal; Notable for the following components:   Bicarbonate 30.9 (*)    Acid-Base Excess 5.0 (*)    Calcium,  Ion 1.10 (*)    All other components within normal limits  BRAIN NATRIURETIC PEPTIDE  CBC WITH DIFFERENTIAL/PLATELET  CBC WITH DIFFERENTIAL/PLATELET  TROPONIN I (HIGH SENSITIVITY)  TROPONIN I (HIGH SENSITIVITY)    EKG EKG Interpretation Date/Time:  Thursday May 30 2023 15:20:25 EST Ventricular Rate:  89 PR Interval:  156 QRS Duration:  89 QT Interval:  394 QTC Calculation: 480 R Axis:   56  Text Interpretation: Sinus rhythm Atrial premature complex Probable left atrial enlargement Probable left ventricular hypertrophy No significant change since last tracing Confirmed by Gwyneth Sprout ([id number]) on 05/30/2023 4:00:42 PM  Radiology DG Chest Port 1 View Result Date: 05/30/2023 CLINICAL DATA:  Shortness of breath. EXAM: PORTABLE CHEST 1 VIEW COMPARISON:  Chest radiograph dated July 19, 2022. FINDINGS: The heart size and mediastinal contours are unchanged. Aortic atherosclerosis. Vascular stents are again noted about the superior mediastinum. Prior left upper lobectomy. Chronic elevation of the right hemidiaphragm. Similar diffuse bilateral interstitial markings, slightly more pronounced on the right. No superimposed focal consolidation or sizable pleural effusion. No acute osseous abnormality. IMPRESSION: Similar diffuse bilateral interstitial markings, favored to represent chronic interstitial thickening/scarring. Superimposed edema or atypical infection can not be entirely excluded. Electronically Signed   By: Hart Robinsons M.D.   On: 05/30/2023 16:41    Procedures Procedures    Medications Ordered in ED Medications  dexamethasone (DECADRON) injection 10 mg (10 mg Intravenous Not Given 05/30/23 1540)  metoCLOPramide (REGLAN) injection 10 mg (10 mg Intravenous Given 05/30/23 1539)  magnesium sulfate IVPB 2 g 50 mL (0 g Intravenous Stopped 05/30/23 1742)  HYDROmorphone (DILAUDID) injection 0.5 mg (0.5 mg Intravenous Given 05/30/23 1833)    ED Course/  Medical Decision Making/  A&P                                 Medical Decision Making Amount and/or Complexity of Data Reviewed Independent Historian: EMS External Data Reviewed: notes. Labs: ordered. Decision-making details documented in ED Course. Radiology: ordered and independent interpretation performed. Decision-making details documented in ED Course. ECG/medicine tests: ordered and independent interpretation performed. Decision-making details documented in ED Course.  Risk Prescription drug management.   Pt with multiple medical problems and comorbidities and presenting today with a complaint that caries a high risk for morbidity and mortality.  Here today with several complaints.  Concern for worsening migraine in the setting of having recent Botox treatments.  Lower suspicion for intracranial hemorrhage, venous dural thrombosis, infectious etiology.  Also concern for possible CO2 narcosis causing headache.  For the shortness of breath concern for COPD exacerbation versus CHF exacerbation versus pneumonia as patient reports she was getting better while on antibiotics but since she stopped her breathing is gotten worse.  She is currently satting 98% on her home 3-1/2 L.  She did receive a DuoNeb and Solu-Medrol and route with minimal change per paramedics.  Lower suspicion for dysrhythmia, PE or pericarditis.  I independently interpreted patient's EKG and labs.  EKG shows a sinus rhythm without acute changes, VBG with normal pH and CO2 levels.  Troponin is negative x 2, BNP is at baseline at 100, CMP without acute findings with creatinine of 1.39 which appears to be patient's baseline I have independently visualized and interpreted pt's images today.  Chest x-ray with chronic changes but no obvious signs of pneumothorax or pneumonia.. Patient reports her headache is not much better but she feels that her breathing is at baseline and wants to go home.  Will treat with another round of amoxicillin and prednisone and pt  will need to follow up with her HA specialist           Final Clinical Impression(s) / ED Diagnoses Final diagnoses:  COPD exacerbation (HCC)  Chronic migraine without aura with status migrainosus, not intractable    Rx / DC Orders ED Discharge Orders          Ordered    amoxicillin (AMOXIL) 500 MG capsule  3 times daily        05/30/23 1951    predniSONE (DELTASONE) 20 MG tablet  Daily        05/30/23 1951              Gwyneth Sprout, MD 05/30/23 1952

## 2023-05-30 NOTE — Discharge Instructions (Signed)
All the blood work looked good today.  The x-ray did not show any new findings.  Everything with your heart looks okay.  We are going to start you back on the amoxicillin and use the prednisone if you feel like you need it.  Also you will need to follow-up with your headache doctor about the headache.  If you start having vomiting, feel like you are breathing is getting worse, any fever or any difficulty moving 1 side of your body or changes in your vision you should return to the emergency room immediately.

## 2023-05-30 NOTE — ED Notes (Signed)
Trinna Post, RN attempted to call nephew Zane. No answer, RN left message. Assigned [facility]AR at the moment due to O2 requirement but waiting to hear back from nephew as well

## 2023-05-30 NOTE — ED Triage Notes (Signed)
Pt to ED from home via EMS with SOB x 1 day & migraine. This migraine has lasted for 2 weeks. 3L @ baseline. H/o CHF & COPD. Lungs diminished & shallow breaths. +DOE. 18g L forearm. 5mg  albuterol. 125mg  Solu Medrol. BG 228. VSS. O2 @ 100% on 3L

## 2023-05-31 NOTE — ED Notes (Signed)
[facility] called for transport.

## 2023-06-12 ENCOUNTER — Ambulatory Visit: Payer: Self-pay | Admitting: Internal Medicine

## 2023-06-12 ENCOUNTER — Telehealth: Payer: Self-pay | Admitting: Internal Medicine

## 2023-06-12 MED ORDER — PREDNISONE 20 MG PO TABS: 40.0000 mg | ORAL_TABLET | Freq: Every day | ORAL | 0 refills | Status: DC

## 2023-06-12 NOTE — Addendum Note (Signed)
 Addended by: Durel Salts on: 06/12/2023 03:13 PM   Modules accepted: Orders

## 2023-06-12 NOTE — Telephone Encounter (Signed)
 I can give her some prednisone but she needs to be seen asap. Prednisone 40 mg daily x 5 days plus continuing her breztri inhaler 2 puffs twice daily and taking albuterol 4 times daily. Please schedule her for an office visit or televisit this week

## 2023-06-12 NOTE — Telephone Encounter (Signed)
 Dr. Celine Mans, please advise. Thanks

## 2023-06-12 NOTE — Telephone Encounter (Addendum)
 Chief Complaint: SOB Symptoms: SOB Frequency: a week Pertinent Negatives: Patient denies fever, CP Disposition: [x] ED /[] Urgent Care (no appt availability in office) / [] Appointment(In office/virtual)/ []  [place] [facility]/ [] Home Care/ [x] Refused Recommended Disposition /[] [place] [facility]/ []  Follow-up with PCP Additional Notes: Pt c/o severe SOB. Reports recent ED visit and was D/C home with prednisone and amoxicillin-- initially improved but after finishing rx a week ago, sx started to worsen. Of note, triager can audibly hear wheezing/SOB during triage. Pt denies CP, fevers. Due to audible wheezing/SOB, triager advised ED for immediate response. Pt refused and stated that the she went to "ED last time and they didn't do anything for her". Triager strongly reinforced needing to be evaluated based on sx and pt stated that she wishes to wait for Dr. Emmit Alexanders to advise/send rx. Additionally, pt has hx of CHF, and reports 5lb increase from yesterday and is taking "water pill" with unknown recent cardiology visit. Triager will forward encounter for Dr. Emmit Alexanders to review and advise. Patient verbalized understanding and is expecting call back from office for next steps.Triager called Pulmonary [facility] to report urgent message.    Reason for Disposition  [1] MODERATE difficulty breathing (e.g., speaks in phrases, SOB even at rest, pulse 100-120) AND [2] NEW-onset or WORSE than normal  Answer Assessment - Initial Assessment Questions E2C2 Pulmonary Triage "Chief Complaint (e.g., cough, sob, wheezing, fever, chills, sweat or additional symptoms) *Go to specific symptom protocol after initial questions. SOB "How long have symptoms been present?" X 1 week - recent ED visit and was discharged with prednisone and amoxicillin - improved on meds, completed meds a week ago, now worsening Have you tested for COVID or Flu? Note: If not, ask patient if a home test can be taken. If so, instruct patient to  call back for positive results. No, reports was tested at hospital and was negative  MEDICINES:   "Have you used any OTC meds to help with symptoms?" Yes If yes, ask "What medications?" Mucinex DM - unknown if provided relief "Have you used your inhalers/maintenance medication?" Yes If yes, "What medications?" Breztri If inhaler, ask "How many puffs and how often?" Note: Review instructions on medication in the chart. Albuterol neb 2x a day  Breztri 2 puffs 2x daily  OXYGEN: "Do you wear supplemental oxygen?" Yes If yes, "How many liters are you supposed to use?" 3.5L  "Do you monitor your oxygen levels?" Yes If yes, "What is your reading (oxygen level) today?" 98 at rest, 86 with exertion "What is your usual oxygen saturation reading?"  (Note: Pulmonary O2 sats should be 90% or greater) Readings are baseline as above  1. RESPIRATORY STATUS: "Describe your breathing?" (e.g., wheezing, shortness of breath, unable to speak, severe coughing)      SOB, difficulty speaking, wheezing 2. ONSET: "When did this breathing problem begin?"      Worsened over the past week 3. PATTERN "Does the difficult breathing come and go, or has it been constant since it started?"      constant 4. SEVERITY: "How bad is your breathing?" (e.g., mild, moderate, severe)    - MILD: No SOB at rest, mild SOB with walking, speaks normally in sentences, can lie down, no retractions, pulse < 100.    - MODERATE: SOB at rest, SOB with minimal exertion and prefers to sit, cannot lie down flat, speaks in phrases, mild retractions, audible wheezing, pulse 100-120.    - SEVERE: Very SOB at rest, speaks in single words, struggling  to breathe, sitting hunched forward, retractions, pulse > 120      severe 5. RECURRENT SYMPTOM: "Have you had difficulty breathing before?" If Yes, ask: "When was the last time?" and "What happened that time?"      Last ED visit 6. CARDIAC HISTORY: "Do you have any history of heart disease?"  (e.g., heart attack, angina, bypass surgery, angioplasty)      CHF - reports daily weights and noticed abdominal bloating, reports increased 5# increase since yesterday 7. LUNG HISTORY: "Do you have any history of lung disease?"  (e.g., pulmonary embolus, asthma, emphysema)     COPD 8. CAUSE: "What do you think is causing the breathing problem?"      COPD vs CHF 9. OTHER SYMPTOMS: "Do you have any other symptoms? (e.g., dizziness, runny nose, cough, chest pain, fever)     Denies CP, fever Some productive cough  Protocols used: Breathing Difficulty-A-AH

## 2023-06-12 NOTE — Telephone Encounter (Signed)
 Noted in triage encounter.

## 2023-06-12 NOTE — Telephone Encounter (Signed)
 E2C2 nurse called to notify us that patient refused to go to the ED when it was suggested due to her audible wheezing and shortness of breath.

## 2023-06-13 NOTE — Telephone Encounter (Signed)
 ATC pt--no answer with no option to leave vm. Line rang for >75min

## 2023-06-14 ENCOUNTER — Telehealth: Payer: Self-pay | Admitting: Internal Medicine

## 2023-06-14 NOTE — Telephone Encounter (Signed)
 ATC patient.  Phone rang > one minute.  No answer.  Will try to call again later today.

## 2023-06-14 NOTE — Telephone Encounter (Signed)
 Patient states having symptoms of wheezing and shortness of breath. Triage nurse not available. Pharmacy is [facility]. Patient phone number [phone].

## 2023-06-14 NOTE — Telephone Encounter (Signed)
 I called and spoke with pt. Pt states she spoke with Amy earlier and she was going to be set up for a Mychart video visit today. NFN

## 2023-06-14 NOTE — Telephone Encounter (Signed)
 See nurse triage encounter from 06/12/2023

## 2023-06-14 NOTE — Telephone Encounter (Addendum)
 Called patient.  Was able to contact patient this morning (previous attempts to reach patient had been unsuccessful per chart notes).  Patient needs a virtual or in-office visit this week - (today) per Dr. Celine Mans.  Patient states she does not have transportation and does NOT want to go to the ED.  Offered in person or virtual appointment with Rubye Oaks, NP for today at 2:00 pm.  Patient refused appointment due to no transportation and she does not have a smart phone for a video visit.  Patient states she will call her cardiologist, Dr. Gala Romney, to try to get a refill on her medications for her CHF and get a telephone visit with them.  Informed patient Dr. Celine Mans did send in prednisone on 06/12/2023 to [facility].  Called pharmacy to verify receipt of med.  Received.  Per Sadie at [facility], prednisone is scheduled to be delivered to patients home today.  Informed patient and instructed to start prednisone today.  Also informed patient if symptoms fail to improve or worsen to seek medical attention immediately - call 911 or go to the nearest emergency room.  Patient verbalized understanding.

## 2023-06-27 ENCOUNTER — Telehealth (HOSPITAL_COMMUNITY): Payer: Self-pay | Admitting: Cardiology

## 2023-06-27 NOTE — Telephone Encounter (Signed)
 Patient called to request refill of metolazone   Reports she accidentally threw away PRN script.  Pt expressed concerns with increased SOB,mild chest tightness, BLE  Reports symptoms started today  Weight stable at 151 lbs Compliant with diuretics Torsemide 40 BID  *note pt unable to return for add on, uses transportation, will need time to arrange *note was notably SOB while on the call    Please advise ok to refill metolazone?

## 2023-06-27 NOTE — Telephone Encounter (Signed)
 Pt aware Reports she will return call to arrange appt

## 2023-06-27 NOTE — Telephone Encounter (Signed)
 She has not had metolazone in a long time. She also recently called pulmonary with concerns about breathing and was started on prednisone. She needs to be seen to determine if dyspnea d/t COPD vs CHF

## 2023-06-30 ENCOUNTER — Emergency Department (HOSPITAL_COMMUNITY)

## 2023-06-30 ENCOUNTER — Emergency Department (HOSPITAL_COMMUNITY)
Admission: EM | Admit: 2023-06-30 | Discharge: 2023-06-30 | Disposition: A | Discharge status: Home / Self Care | Attending: Emergency Medicine | Admitting: Emergency Medicine

## 2023-06-30 ENCOUNTER — Other Ambulatory Visit: Payer: Self-pay

## 2023-06-30 ENCOUNTER — Encounter (HOSPITAL_COMMUNITY): Payer: Self-pay

## 2023-06-30 DIAGNOSIS — I2510 Atherosclerotic heart disease of native coronary artery without angina pectoris: Secondary | ICD-10-CM | POA: Diagnosis not present

## 2023-06-30 DIAGNOSIS — E1122 Type 2 diabetes mellitus with diabetic chronic kidney disease: Secondary | ICD-10-CM | POA: Diagnosis not present

## 2023-06-30 DIAGNOSIS — R0602 Shortness of breath: Secondary | ICD-10-CM | POA: Diagnosis present

## 2023-06-30 DIAGNOSIS — N183 Chronic kidney disease, stage 3 unspecified: Secondary | ICD-10-CM | POA: Diagnosis not present

## 2023-06-30 DIAGNOSIS — J441 Chronic obstructive pulmonary disease with (acute) exacerbation: Secondary | ICD-10-CM | POA: Insufficient documentation

## 2023-06-30 DIAGNOSIS — I509 Heart failure, unspecified: Secondary | ICD-10-CM | POA: Diagnosis not present

## 2023-06-30 LAB — BASIC METABOLIC PANEL
Anion gap: 9 (ref 5–15)
BUN: 33 mg/dL — ABNORMAL HIGH (ref 8–23)
CO2: 27 mmol/L (ref 22–32)
Calcium: 8.9 mg/dL (ref 8.9–10.3)
Chloride: 96 mmol/L — ABNORMAL LOW (ref 98–111)
Creatinine, Ser: 1.47 mg/dL — ABNORMAL HIGH (ref 0.44–1.00)
GFR, Estimated: 35 mL/min — ABNORMAL LOW (ref >60)
Glucose, Bld: 181 mg/dL — ABNORMAL HIGH (ref 70–99)
Potassium: 4.1 mmol/L (ref 3.5–5.1)
Sodium: 132 mmol/L — ABNORMAL LOW (ref 135–145)

## 2023-06-30 LAB — RESP PANEL BY RT-PCR (RSV, FLU A&B, COVID)  RVPGX2
Influenza A by PCR: NEGATIVE
Influenza B by PCR: NEGATIVE
Resp Syncytial Virus by PCR: NEGATIVE
SARS Coronavirus 2 by RT PCR: NEGATIVE

## 2023-06-30 LAB — CBC WITH DIFFERENTIAL/PLATELET
Abs Immature Granulocytes: 0.05 10*3/uL (ref 0.00–0.07)
Basophils Absolute: 0 10*3/uL (ref 0.0–0.1)
Basophils Relative: 0 %
Eosinophils Absolute: 0.1 10*3/uL (ref 0.0–0.5)
Eosinophils Relative: 1 %
HCT: 37.4 % (ref 36.0–46.0)
Hemoglobin: 12.3 g/dL (ref 12.0–15.0)
Immature Granulocytes: 1 %
Lymphocytes Relative: 16 %
Lymphs Abs: 1.5 10*3/uL (ref 0.7–4.0)
MCH: 29.5 pg (ref 26.0–34.0)
MCHC: 32.9 g/dL (ref 30.0–36.0)
MCV: 89.7 fL (ref 80.0–100.0)
Monocytes Absolute: 0.7 10*3/uL (ref 0.1–1.0)
Monocytes Relative: 7 %
Neutro Abs: 7.2 10*3/uL (ref 1.7–7.7)
Neutrophils Relative %: 75 %
Platelets: 258 10*3/uL (ref 150–400)
RBC: 4.17 MIL/uL (ref 3.87–5.11)
RDW: 12.5 % (ref 11.5–15.5)
WBC: 9.6 10*3/uL (ref 4.0–10.5)
nRBC: 0 % (ref 0.0–0.2)

## 2023-06-30 LAB — BRAIN NATRIURETIC PEPTIDE: B Natriuretic Peptide: 94.3 pg/mL (ref 0.0–100.0)

## 2023-06-30 MED ORDER — AZITHROMYCIN 250 MG PO TABS: 250.0000 mg | ORAL_TABLET | Freq: Every day | ORAL | 0 refills | Status: DC

## 2023-06-30 MED ORDER — PREDNISONE 20 MG PO TABS: 40.0000 mg | ORAL_TABLET | Freq: Every day | ORAL | 0 refills | Status: DC

## 2023-06-30 MED ORDER — SODIUM CHLORIDE 0.9 % IV BOLUS
500.0000 mL | Freq: Once | INTRAVENOUS | Status: AC
  Administered 2023-06-30: 500 mL via INTRAVENOUS

## 2023-06-30 MED ORDER — IPRATROPIUM-ALBUTEROL 0.5-2.5 (3) MG/3ML IN SOLN
3.0000 mL | Freq: Once | RESPIRATORY_TRACT | Status: AC
  Administered 2023-06-30: 3 mL via RESPIRATORY_TRACT
  Filled 2023-06-30: qty 3

## 2023-06-30 MED ORDER — MAGNESIUM SULFATE 2 GM/50ML IV SOLN
2.0000 g | Freq: Once | INTRAVENOUS | Status: AC
Start: 2023-06-30 — End: 2023-06-30
  Administered 2023-06-30: 2 g via INTRAVENOUS
  Filled 2023-06-30: qty 50

## 2023-06-30 NOTE — Discharge Instructions (Signed)
 It was a pleasure taking part in your care.  I am treating you for a COPD exacerbation.  Please begin taking prednisone 40 mg once a day for 4 days.  You will start this on 3/17.  Please also begin taking azithromycin, Z-Pak.  You will take 500 mg on day 1 followed by 250 mg until out of medicine.  Please continue with breathing treatments at home.  Continue wearing oxygen at home.  Follow-up with your PCP.  Return to the ED with any new symptoms.

## 2023-06-30 NOTE — ED Triage Notes (Signed)
 Pt to ED from home via EMS c/o SOB x 2 weeks with green mucous. H/o CHF & COPD. 2 duonebs given in field. 125mg  solu medrol. 18g L forearm. 98/50 82. CBG 162.

## 2023-06-30 NOTE — ED Provider Notes (Signed)
 [place] EMERGENCY DEPARTMENT AT [facility] Provider Note   CSN: [id number] Arrival date & time: 06/30/23  1127     History No chief complaint on file.   Caitlin Oliver is a 83 y.o. female with medical history of CAD, CHF, COPD no longer smoking, diabetes, GERD, vertigo, PAD, chronic respiratory viral hypoxia on oxygen at home, stage III CKD.  Patient presents to ED for evaluation of shortness of breath.  Patient reports that she "always feels short of breath".  She reports that in the last 3 days her shortness breath has acutely worsened.  She reports that she chronically wears oxygen at home, has not increased recently.  She states that she is having a productive cough but denies any fevers.  She denies chest pain, nausea, vomiting or abdominal pain.  She reports she feels dizzy but then goes on to state that she "always feels dizzy" due to her vertigo.  Denies any change to her dizziness.  Denies any leg swelling.  HPI     Home Medications Prior to Admission medications   Medication Sig Start Date End Date Taking? Authorizing Provider  azithromycin (ZITHROMAX) 250 MG tablet Take 1 tablet (250 mg total) by mouth daily. Take first 2 tablets together, then 1 every day until finished. 06/30/23  Yes Al Decant, PA-C  predniSONE (DELTASONE) 20 MG tablet Take 2 tablets (40 mg total) by mouth daily. 06/30/23  Yes Al Decant, PA-C  acetaminophen (TYLENOL) 500 MG tablet Take 500 mg by mouth every 6 (six) hours as needed for moderate pain.    [provider]  albuterol (PROVENTIL HFA;VENTOLIN HFA) 108 (90 Base) MCG/ACT inhaler Inhale 2 puffs into the lungs every 6 (six) hours as needed for wheezing or shortness of breath.  07/11/17   [provider]  albuterol (PROVENTIL) (2.5 MG/3ML) 0.083% nebulizer solution Take 2.5 mg by nebulization in the morning and at bedtime.    [provider]  amoxicillin (AMOXIL) 500 MG capsule Take 1 capsule (500  mg total) by mouth 3 (three) times daily. 05/30/23   Gwyneth Sprout, MD  Ascorbic Acid (VITAMIN C) 1000 MG tablet Take 1,000 mg by mouth daily.    [provider]  atorvastatin (LIPITOR) 40 MG tablet Take 1 tablet (40 mg total) by mouth daily. 04/24/22     bisoprolol (ZEBETA) 5 MG tablet TAKE ONE-HALF TABLET BY MOUTH DAILY 08/30/22   Bensimhon, Bevelyn Buckles, MD  BREZTRI AEROSPHERE 160-9-4.8 MCG/ACT AERO Inhale 2 puffs into the lungs in the morning and at bedtime. 01/26/23   Charlott Holler, MD  carboxymethylcellul-glycerin (REFRESH OPTIVE) 0.5-0.9 % ophthalmic solution 1 drop as needed for dry eyes.    [provider]  Cholecalciferol (VITAMIN D3 PO) Take 50 mcg by mouth daily. 1 capsules daily    [provider]  clopidogrel (PLAVIX) 75 MG tablet Take 1 tablet (75 mg total) by mouth daily. 04/24/22     cyclobenzaprine (FLEXERIL) 5 MG tablet Take 1 tablet (5 mg total) by mouth 2 (two) times daily as needed for muscle spasms 04/24/22     docusate sodium (COLACE) 100 MG capsule Take 100 mg by mouth 2 (two) times daily as needed for mild constipation.    [provider]  escitalopram (LEXAPRO) 10 MG tablet Take 1 tablet (10 mg total) by mouth daily. 04/24/22     ferrous sulfate 325 (65 FE) MG tablet Take 1 tablet (325 mg total) by mouth daily with breakfast. 04/24/22  Folic Acid-Vit B6-Vit B12 (FOLBEE) 2.5-25-1 MG TABS tablet Take 1 tablet by mouth daily.    [provider]  linagliptin (TRADJENTA) 5 MG TABS tablet Take 1 tablet (5 mg total) by mouth daily. 04/24/22     Magnesium 250 MG TABS Take 250 mg by mouth in the morning and at bedtime. 1 in am and 1 at HS.    [provider]  meclizine (ANTIVERT) 25 MG tablet Take 1 tablet (25 mg total) by mouth every morning. 04/24/22     Misc. Devices KIT Please supply puls-oximetry for patient on chronic home oxygen. 02/27/23   Charlott Holler, MD  Multiple Vitamin (MULTIVITAMIN WITH MINERALS) TABS tablet Take 1 tablet  by mouth daily. 05/27/21   Levin Erp, MD  naphazoline-glycerin (CLEAR EYES REDNESS) 0.012-0.25 % SOLN 1-2 drops 4 (four) times daily as needed for eye irritation.    [provider]  ondansetron (ZOFRAN) 4 MG tablet Take 4 mg by mouth daily as needed for nausea.    [provider]  ONE TOUCH ULTRA TEST test strip  09/12/16   [provider]  OXYGEN Inhale 2.5 L into the lungs.    [provider]  pantoprazole (PROTONIX) 40 MG tablet Take 1 tablet (40 mg total) by mouth 2 (two) times daily. 04/24/22     polyethylene glycol (MIRALAX / GLYCOLAX) 17 g packet Take 17 g by mouth daily as needed for mild constipation. 12/02/18   [provider]  potassium chloride SA (KLOR-CON M) 20 MEQ tablet TAKE ONE TABLET BY MOUTH EVERY DAY 08/30/22   Bensimhon, Bevelyn Buckles, MD  senna (SENOKOT) 8.6 MG TABS tablet Take 1 tablet (8.6 mg total) by mouth daily. 02/04/22   Evette Georges, MD  Torsemide 40 MG TABS Take 2 tablets by mouth 2 (two) times daily. Patient taking differently: Take 2 tablets by mouth 2 (two) times daily. 20 mg, 2 tabs 06/05/22   Bensimhon, Bevelyn Buckles, MD      Allergies    Fentanyl    Review of Systems   Review of Systems  Constitutional:  Negative for fever.  Respiratory:  Positive for shortness of breath.   Cardiovascular:  Negative for chest pain.  All other systems reviewed and are negative.   Physical Exam Updated Vital Signs BP (!) 141/87   Pulse (!) 109   Temp (!) 97.5 F (36.4 C)   Resp (!) 30   Ht 5\' 4"  (1.626 m)   Wt 68.9 kg   SpO2 98%   BMI 26.09 kg/m  Physical Exam Vitals and nursing note reviewed.  Constitutional:      General: She is not in acute distress.    Appearance: Normal appearance. She is not ill-appearing, toxic-appearing or diaphoretic.  HENT:     Head: Normocephalic and atraumatic.     Nose: Nose normal.     Mouth/Throat:     Mouth: Mucous membranes are moist.     Pharynx: Oropharynx is clear.  Eyes:      Extraocular Movements: Extraocular movements intact.     Conjunctiva/sclera: Conjunctivae normal.     Pupils: Pupils are equal, round, and reactive to light.  Cardiovascular:     Rate and Rhythm: Normal rate and regular rhythm.  Pulmonary:     Effort: Pulmonary effort is normal.     Breath sounds: Wheezing present.  Abdominal:     General: Abdomen is flat. Bowel sounds are normal.     Palpations: Abdomen is soft.  Tenderness: There is no abdominal tenderness.  Musculoskeletal:     Cervical back: Normal range of motion and neck supple. No tenderness.     Right lower leg: No edema.     Left lower leg: No edema.  Skin:    General: Skin is warm and dry.     Capillary Refill: Capillary refill takes less than 2 seconds.  Neurological:     Mental Status: She is alert and oriented to person, place, and time.     ED Results / Procedures / Treatments   Labs (all labs ordered are listed, but only abnormal results are displayed) Labs Reviewed  BASIC METABOLIC PANEL - Abnormal; Notable for the following components:      Result Value   Sodium 132 (*)    Chloride 96 (*)    Glucose, Bld 181 (*)    BUN 33 (*)    Creatinine, Ser 1.47 (*)    GFR, Estimated 35 (*)    All other components within normal limits  RESP PANEL BY RT-PCR (RSV, FLU A&B, COVID)  RVPGX2  CBC WITH DIFFERENTIAL/PLATELET  BRAIN NATRIURETIC PEPTIDE    EKG None  Radiology DG Chest 2 View Result Date: 06/30/2023 CLINICAL DATA:  Shortness of breath. EXAM: CHEST - 2 VIEW COMPARISON:  05/30/2023 FINDINGS: Low lung volume. Redemonstration of increased interstitial markings throughout bilateral lungs which are likely accentuated by low lung volume. No significant interval change. No frank pulmonary edema. Bilateral lung fields are otherwise clear. No dense consolidation or lung collapse. Bilateral costophrenic angles are clear. Stable cardio-mediastinal silhouette. No acute osseous abnormalities. The soft tissues are within  normal limits. IMPRESSION: *Redemonstration of increased interstitial markings throughout bilateral lungs. Differential diagnosis includes underlying chronic interstitial lung disease, atypical infection or pulmonary edema. Electronically Signed   By: Jules Schick M.D.   On: 06/30/2023 12:43    Procedures Procedures   Medications Ordered in ED Medications  ipratropium-albuterol (DUONEB) 0.5-2.5 (3) MG/3ML nebulizer solution 3 mL (3 mLs Nebulization Given 06/30/23 1142)  magnesium sulfate IVPB 2 g 50 mL (0 g Intravenous Stopped 06/30/23 1150)  sodium chloride 0.9 % bolus 500 mL (0 mLs Intravenous Stopped 06/30/23 1620)    ED Course/ Medical Decision Making/ A&P  Medical Decision Making Amount and/or Complexity of Data Reviewed Labs: ordered. Radiology: ordered.  Risk Prescription drug management.   83 year old female presents for evaluation.  Please see HPI for further details.  On examination patient is afebrile, nontachycardic.  Lung sounds have wheezing throughout, she arrives on 3 L of oxygen by nasal cannula.  Abdomen soft and compressible.  Neurological examinations baseline.  No edema bilateral lower extremities.  Will treat with DuoNeb.  Patient received 125 Solu-Medrol with EMS.  Will also give 2 g of magnesium.  Will collect labs to include CBC, BMP, BNP, viral panel, chest x-ray and EKG.  Suspect COPD exacerbation.  CBC without leukocytosis or anemia.  Metabolic panel with sodium 132 repleted with 500 mL of fluid, baseline creatinine 1.47, GFR 35, anion gap 9, BUN 33.  BNP not elevated at 94.3.  Viral panel negative for all.  Chest x-ray shows chronic findings.  EKG is nonischemic.  Patient was given 1 DuoNeb.  Patient states that after duo nebulizer, she feels much better.  She has no wheezing.  She has oxygen at home.  Will send patient home with steroid burst as well as Z-Pak.  Will have her follow-up with her PCP.  Stable to discharge home.   Final Clinical  Impression(s) / ED Diagnoses Final diagnoses:  COPD exacerbation (HCC)    Rx / DC Orders ED Discharge Orders          Ordered    predniSONE (DELTASONE) 20 MG tablet  Daily        06/30/23 1604    azithromycin (ZITHROMAX) 250 MG tablet  Daily        06/30/23 1604              Clent Ridges 06/30/23 1948    Benjiman Core, MD 06/30/23 2142

## 2023-07-18 ENCOUNTER — Other Ambulatory Visit: Payer: Self-pay | Admitting: Internal Medicine

## 2023-07-22 ENCOUNTER — Ambulatory Visit: Payer: Self-pay | Admitting: Internal Medicine

## 2023-07-22 NOTE — Telephone Encounter (Signed)
 Called and spoke with patient, she stated she has been to the ER twice and is not going again.  They give her steroids and send her home.  Dr. Celine Mans, can we use your 11:30 am slot on Wednesday 07/25/23 for an acute visit for this patient?  This is the day her ride can bring her.  Please advise.  Thank you.

## 2023-07-22 NOTE — Telephone Encounter (Signed)
 E2C2 Pulmonary Triage - Initial Assessment Questions "Chief Complaint (e.g., cough, sob, wheezing, fever, chills, sweat or additional symptoms) *Go to specific symptom protocol after initial questions. Three weeks ago developed body aches, increased shortness of breath, weakness, lightheadedness. Patient states she seen by PCP last Wednesday-PCP thought she should be seen by pulmonary to see if she needs changes to her inhalers.  Patient was last seen on 06/30/2023 for COPD exacerbation and was discharged home with prednisone and z-pack. Patient is having to take breaths in between words as she is talking to nurse triage. Recommendation of ED is given to patient as she is audibly short of breath. Patient refuses and states she isn't going to the ED-"They did nothing for me." Patient verbalized understanding but stated "I am not going back to the ED." Patient then hung up the phone.   "How long have symptoms been present?" Three weeks-patient states she feels like her breathing is worse. Movement intolerance  Have you tested for COVID or Flu? Note: If not, ask patient if a home test can be taken. If so, instruct patient to call back for positive results. No  MEDICINES:   "Have you used any OTC meds to help with symptoms?" No If yes, ask "What medications?"   "Have you used your inhalers/maintenance medication?" Yes If yes, "What medications?" Breztri Albuterol  If inhaler, ask "How many puffs and how often?" Note: Review instructions on medication in the chart. Breztri 2 puffs twice a day Albuterol 2 puffs every six hours PRN OXYGEN: "Do you wear supplemental oxygen?" Yes If yes, "How many liters are you supposed to use?" 3.5-4L  "Do you monitor your oxygen levels?" Yes If yes, "What is your reading (oxygen level) today?" 95-96% when she is sitting still. Pulse ox goes down to the 80's with any movement  "What is your usual oxygen saturation reading?"  (Note: Pulmonary O2 sats should be  90% or greater) 95-96%    Copied from CRM #[id number]. Topic: Clinical - Red Word Triage >> Jul 22, 2023  2:20 PM Para March B wrote: [facility] that prompted transfer to Nurse Triage: Diff Breathing Reason for Disposition  Patient sounds very sick or weak to the triager  Answer Assessment - Initial Assessment Questions 14. SEVERITY: "How bad is your breathing?" (e.g., mild, moderate, severe)    - MILD: No SOB at rest, mild SOB with walking, speaks normally in sentences, can lie down, no retractions, pulse < 100.    - MODERATE: SOB at rest, SOB with minimal exertion and prefers to sit, cannot lie down flat, speaks in phrases, mild retractions, audible wheezing, pulse 100-120.    - SEVERE: Very SOB at rest, speaks in single words, struggling to breathe, sitting hunched forward, retractions, pulse > 120      Severe 6. CARDIAC HISTORY: "Do you have any history of heart disease?" (e.g., heart attack, angina, bypass surgery, angioplasty)      no 7. LUNG HISTORY: "Do you have any history of lung disease?"  (e.g., pulmonary embolus, asthma, emphysema)     COPD, LUNG CA  Protocols used: Breathing Difficulty-A-AH

## 2023-07-22 NOTE — Telephone Encounter (Signed)
 She needs to go to UC/ED for evaluation. We haven't seen her since March If still refusing see if she can be added to Dr. Humphrey Rolls schedule tomorrow if she ok's

## 2023-07-22 NOTE — Telephone Encounter (Signed)
 Beth ca you please advise .  Thank you

## 2023-07-23 NOTE — Telephone Encounter (Signed)
 Spoke with patient ok per Dr.Desai to double book patient for 11:00 am on 07/25/2023.Patient was advised to come 15 mins earlier. Patient's voice was understanding.Nothing else further needed.

## 2023-07-25 ENCOUNTER — Encounter (HOSPITAL_COMMUNITY): Payer: Self-pay

## 2023-07-25 ENCOUNTER — Ambulatory Visit: Admitting: Internal Medicine

## 2023-07-25 ENCOUNTER — Encounter: Payer: Self-pay | Admitting: Internal Medicine

## 2023-07-25 ENCOUNTER — Inpatient Hospital Stay (HOSPITAL_COMMUNITY)
Admission: EM | Admit: 2023-07-25 | Discharge: 2023-07-27 | DRG: 191 | Disposition: A | Discharge status: Home / Self Care | Attending: Internal Medicine | Admitting: Internal Medicine

## 2023-07-25 ENCOUNTER — Emergency Department (HOSPITAL_COMMUNITY)

## 2023-07-25 ENCOUNTER — Other Ambulatory Visit: Payer: Self-pay

## 2023-07-25 VITALS — BP 109/60 | HR 84 | Ht 64.0 in | Wt 157.6 lb

## 2023-07-25 DIAGNOSIS — K219 Gastro-esophageal reflux disease without esophagitis: Secondary | ICD-10-CM | POA: Diagnosis present

## 2023-07-25 DIAGNOSIS — E119 Type 2 diabetes mellitus without complications: Secondary | ICD-10-CM

## 2023-07-25 DIAGNOSIS — Z79891 Long term (current) use of opiate analgesic: Secondary | ICD-10-CM

## 2023-07-25 DIAGNOSIS — Z8673 Personal history of transient ischemic attack (TIA), and cerebral infarction without residual deficits: Secondary | ICD-10-CM

## 2023-07-25 DIAGNOSIS — E1165 Type 2 diabetes mellitus with hyperglycemia: Secondary | ICD-10-CM | POA: Diagnosis present

## 2023-07-25 DIAGNOSIS — Z87891 Personal history of nicotine dependence: Secondary | ICD-10-CM

## 2023-07-25 DIAGNOSIS — R918 Other nonspecific abnormal finding of lung field: Secondary | ICD-10-CM | POA: Diagnosis present

## 2023-07-25 DIAGNOSIS — J9611 Chronic respiratory failure with hypoxia: Secondary | ICD-10-CM | POA: Diagnosis not present

## 2023-07-25 DIAGNOSIS — Z902 Acquired absence of lung [part of]: Secondary | ICD-10-CM

## 2023-07-25 DIAGNOSIS — I152 Hypertension secondary to endocrine disorders: Secondary | ICD-10-CM | POA: Diagnosis present

## 2023-07-25 DIAGNOSIS — Z79899 Other long term (current) drug therapy: Secondary | ICD-10-CM

## 2023-07-25 DIAGNOSIS — E785 Hyperlipidemia, unspecified: Secondary | ICD-10-CM | POA: Diagnosis present

## 2023-07-25 DIAGNOSIS — J441 Chronic obstructive pulmonary disease with (acute) exacerbation: Secondary | ICD-10-CM

## 2023-07-25 DIAGNOSIS — F17290 Nicotine dependence, other tobacco product, uncomplicated: Secondary | ICD-10-CM | POA: Diagnosis present

## 2023-07-25 DIAGNOSIS — Z823 Family history of stroke: Secondary | ICD-10-CM

## 2023-07-25 DIAGNOSIS — Z8711 Personal history of peptic ulcer disease: Secondary | ICD-10-CM

## 2023-07-25 DIAGNOSIS — N1832 Chronic kidney disease, stage 3b: Secondary | ICD-10-CM | POA: Diagnosis present

## 2023-07-25 DIAGNOSIS — Z7984 Long term (current) use of oral hypoglycemic drugs: Secondary | ICD-10-CM

## 2023-07-25 DIAGNOSIS — E1122 Type 2 diabetes mellitus with diabetic chronic kidney disease: Secondary | ICD-10-CM | POA: Diagnosis present

## 2023-07-25 DIAGNOSIS — E861 Hypovolemia: Secondary | ICD-10-CM | POA: Diagnosis present

## 2023-07-25 DIAGNOSIS — E7800 Pure hypercholesterolemia, unspecified: Secondary | ICD-10-CM | POA: Diagnosis present

## 2023-07-25 DIAGNOSIS — Z9981 Dependence on supplemental oxygen: Secondary | ICD-10-CM

## 2023-07-25 DIAGNOSIS — J9621 Acute and chronic respiratory failure with hypoxia: Secondary | ICD-10-CM

## 2023-07-25 DIAGNOSIS — E1169 Type 2 diabetes mellitus with other specified complication: Secondary | ICD-10-CM | POA: Diagnosis present

## 2023-07-25 DIAGNOSIS — Z7902 Long term (current) use of antithrombotics/antiplatelets: Secondary | ICD-10-CM

## 2023-07-25 DIAGNOSIS — E042 Nontoxic multinodular goiter: Secondary | ICD-10-CM | POA: Diagnosis present

## 2023-07-25 DIAGNOSIS — Z841 Family history of disorders of kidney and ureter: Secondary | ICD-10-CM

## 2023-07-25 DIAGNOSIS — Z1152 Encounter for screening for COVID-19: Secondary | ICD-10-CM

## 2023-07-25 DIAGNOSIS — R519 Headache, unspecified: Secondary | ICD-10-CM | POA: Diagnosis present

## 2023-07-25 DIAGNOSIS — Z85118 Personal history of other malignant neoplasm of bronchus and lung: Secondary | ICD-10-CM

## 2023-07-25 DIAGNOSIS — Z7951 Long term (current) use of inhaled steroids: Secondary | ICD-10-CM

## 2023-07-25 DIAGNOSIS — G8929 Other chronic pain: Secondary | ICD-10-CM | POA: Diagnosis present

## 2023-07-25 DIAGNOSIS — I509 Heart failure, unspecified: Secondary | ICD-10-CM | POA: Diagnosis present

## 2023-07-25 DIAGNOSIS — Z95820 Peripheral vascular angioplasty status with implants and grafts: Secondary | ICD-10-CM

## 2023-07-25 DIAGNOSIS — F32A Depression, unspecified: Secondary | ICD-10-CM | POA: Diagnosis present

## 2023-07-25 DIAGNOSIS — R0603 Acute respiratory distress: Secondary | ICD-10-CM

## 2023-07-25 DIAGNOSIS — Z90710 Acquired absence of both cervix and uterus: Secondary | ICD-10-CM

## 2023-07-25 DIAGNOSIS — R0602 Shortness of breath: Secondary | ICD-10-CM | POA: Diagnosis not present

## 2023-07-25 DIAGNOSIS — F419 Anxiety disorder, unspecified: Secondary | ICD-10-CM | POA: Diagnosis present

## 2023-07-25 DIAGNOSIS — E1151 Type 2 diabetes mellitus with diabetic peripheral angiopathy without gangrene: Secondary | ICD-10-CM | POA: Diagnosis present

## 2023-07-25 DIAGNOSIS — Z87442 Personal history of urinary calculi: Secondary | ICD-10-CM

## 2023-07-25 DIAGNOSIS — Z885 Allergy status to narcotic agent status: Secondary | ICD-10-CM

## 2023-07-25 DIAGNOSIS — Z66 Do not resuscitate: Secondary | ICD-10-CM | POA: Diagnosis present

## 2023-07-25 DIAGNOSIS — Z8249 Family history of ischemic heart disease and other diseases of the circulatory system: Secondary | ICD-10-CM

## 2023-07-25 DIAGNOSIS — E1159 Type 2 diabetes mellitus with other circulatory complications: Secondary | ICD-10-CM | POA: Diagnosis present

## 2023-07-25 LAB — RESP PANEL BY RT-PCR (RSV, FLU A&B, COVID)  RVPGX2
Influenza A by PCR: NEGATIVE
Influenza B by PCR: NEGATIVE
Resp Syncytial Virus by PCR: NEGATIVE
SARS Coronavirus 2 by RT PCR: NEGATIVE

## 2023-07-25 LAB — CBC WITH DIFFERENTIAL/PLATELET
Abs Immature Granulocytes: 0.06 10*3/uL (ref 0.00–0.07)
Basophils Absolute: 0.1 10*3/uL (ref 0.0–0.1)
Basophils Relative: 1 %
Eosinophils Absolute: 0.1 10*3/uL (ref 0.0–0.5)
Eosinophils Relative: 1 %
HCT: 37 % (ref 36.0–46.0)
Hemoglobin: 12.3 g/dL (ref 12.0–15.0)
Immature Granulocytes: 1 %
Lymphocytes Relative: 9 %
Lymphs Abs: 0.9 10*3/uL (ref 0.7–4.0)
MCH: 30 pg (ref 26.0–34.0)
MCHC: 33.2 g/dL (ref 30.0–36.0)
MCV: 90.2 fL (ref 80.0–100.0)
Monocytes Absolute: 0.7 10*3/uL (ref 0.1–1.0)
Monocytes Relative: 7 %
Neutro Abs: 8.5 10*3/uL — ABNORMAL HIGH (ref 1.7–7.7)
Neutrophils Relative %: 81 %
Platelets: 264 10*3/uL (ref 150–400)
RBC: 4.1 MIL/uL (ref 3.87–5.11)
RDW: 12.9 % (ref 11.5–15.5)
WBC: 10.4 10*3/uL (ref 4.0–10.5)
nRBC: 0 % (ref 0.0–0.2)

## 2023-07-25 LAB — COMPREHENSIVE METABOLIC PANEL WITH GFR
ALT: 18 U/L (ref 0–44)
AST: 23 U/L (ref 15–41)
Albumin: 3.7 g/dL (ref 3.5–5.0)
Alkaline Phosphatase: 78 U/L (ref 38–126)
Anion gap: 13 (ref 5–15)
BUN: 26 mg/dL — ABNORMAL HIGH (ref 8–23)
CO2: 25 mmol/L (ref 22–32)
Calcium: 8.6 mg/dL — ABNORMAL LOW (ref 8.9–10.3)
Chloride: 97 mmol/L — ABNORMAL LOW (ref 98–111)
Creatinine, Ser: 1.36 mg/dL — ABNORMAL HIGH (ref 0.44–1.00)
GFR, Estimated: 39 mL/min — ABNORMAL LOW
Glucose, Bld: 107 mg/dL — ABNORMAL HIGH (ref 70–99)
Potassium: 3.9 mmol/L (ref 3.5–5.1)
Sodium: 135 mmol/L (ref 135–145)
Total Bilirubin: 0.6 mg/dL (ref 0.0–1.2)
Total Protein: 6.4 g/dL — ABNORMAL LOW (ref 6.5–8.1)

## 2023-07-25 LAB — CBG MONITORING, ED: Glucose-Capillary: 233 mg/dL — ABNORMAL HIGH (ref 70–99)

## 2023-07-25 LAB — TROPONIN I (HIGH SENSITIVITY): Troponin I (High Sensitivity): 15 ng/L (ref <18)

## 2023-07-25 LAB — BRAIN NATRIURETIC PEPTIDE: B Natriuretic Peptide: 127.3 pg/mL — ABNORMAL HIGH (ref 0.0–100.0)

## 2023-07-25 MED ORDER — IOHEXOL 350 MG/ML SOLN
75.0000 mL | Freq: Once | INTRAVENOUS | Status: AC | PRN
  Administered 2023-07-25: 75 mL via INTRAVENOUS

## 2023-07-25 MED ORDER — ESCITALOPRAM OXALATE 10 MG PO TABS
10.0000 mg | ORAL_TABLET | Freq: Every day | ORAL | Status: DC
  Administered 2023-07-26 – 2023-07-27 (×2): 10 mg via ORAL
  Filled 2023-07-25 (×2): qty 1

## 2023-07-25 MED ORDER — HEPARIN SODIUM (PORCINE) 5000 UNIT/ML IJ SOLN
5000.0000 [IU] | Freq: Three times a day (TID) | INTRAMUSCULAR | Status: DC
  Administered 2023-07-25 – 2023-07-27 (×5): 5000 [IU] via SUBCUTANEOUS
  Filled 2023-07-25 (×5): qty 1

## 2023-07-25 MED ORDER — BISOPROLOL FUMARATE 5 MG PO TABS
2.5000 mg | ORAL_TABLET | Freq: Every day | ORAL | Status: DC
  Administered 2023-07-26 – 2023-07-27 (×2): 2.5 mg via ORAL
  Filled 2023-07-25 (×2): qty 0.5

## 2023-07-25 MED ORDER — METHYLPREDNISOLONE SODIUM SUCC 125 MG IJ SOLR
80.0000 mg | INTRAMUSCULAR | Status: DC
  Administered 2023-07-25 – 2023-07-26 (×2): 80 mg via INTRAVENOUS
  Filled 2023-07-25 (×2): qty 2

## 2023-07-25 MED ORDER — IPRATROPIUM-ALBUTEROL 0.5-2.5 (3) MG/3ML IN SOLN
3.0000 mL | Freq: Once | RESPIRATORY_TRACT | Status: AC
  Administered 2023-07-25: 3 mL via RESPIRATORY_TRACT
  Filled 2023-07-25: qty 3

## 2023-07-25 MED ORDER — IPRATROPIUM-ALBUTEROL 0.5-2.5 (3) MG/3ML IN SOLN
3.0000 mL | Freq: Four times a day (QID) | RESPIRATORY_TRACT | Status: DC | PRN
  Administered 2023-07-26: 3 mL via RESPIRATORY_TRACT
  Filled 2023-07-25: qty 3

## 2023-07-25 MED ORDER — ONDANSETRON HCL 4 MG/2ML IJ SOLN: 4.0000 mg | Freq: Four times a day (QID) | INTRAMUSCULAR | Status: DC | PRN

## 2023-07-25 MED ORDER — SENNOSIDES-DOCUSATE SODIUM 8.6-50 MG PO TABS
1.0000 | ORAL_TABLET | Freq: Every evening | ORAL | Status: DC | PRN
  Administered 2023-07-27: 1 via ORAL
  Filled 2023-07-25: qty 1

## 2023-07-25 MED ORDER — SENNA 8.6 MG PO TABS
1.0000 | ORAL_TABLET | Freq: Every day | ORAL | Status: DC
  Administered 2023-07-26 – 2023-07-27 (×2): 8.6 mg via ORAL
  Filled 2023-07-25 (×2): qty 1

## 2023-07-25 MED ORDER — INSULIN ASPART 100 UNIT/ML IJ SOLN
0.0000 [IU] | Freq: Three times a day (TID) | INTRAMUSCULAR | Status: DC
  Administered 2023-07-26: 1 [IU] via SUBCUTANEOUS
  Administered 2023-07-26: 5 [IU] via SUBCUTANEOUS
  Administered 2023-07-26: 1 [IU] via SUBCUTANEOUS
  Administered 2023-07-27: 3 [IU] via SUBCUTANEOUS

## 2023-07-25 MED ORDER — PANTOPRAZOLE SODIUM 40 MG PO TBEC
40.0000 mg | DELAYED_RELEASE_TABLET | Freq: Two times a day (BID) | ORAL | Status: DC
  Administered 2023-07-25 – 2023-07-27 (×4): 40 mg via ORAL
  Filled 2023-07-25 (×4): qty 1

## 2023-07-25 MED ORDER — BUDESONIDE 0.25 MG/2ML IN SUSP
0.2500 mg | Freq: Two times a day (BID) | RESPIRATORY_TRACT | Status: DC
  Administered 2023-07-25 – 2023-07-27 (×4): 0.25 mg via RESPIRATORY_TRACT
  Filled 2023-07-25 (×4): qty 2

## 2023-07-25 MED ORDER — CLOPIDOGREL BISULFATE 75 MG PO TABS
75.0000 mg | ORAL_TABLET | Freq: Every day | ORAL | Status: DC
  Administered 2023-07-26 – 2023-07-27 (×2): 75 mg via ORAL
  Filled 2023-07-25 (×2): qty 1

## 2023-07-25 MED ORDER — ACETAMINOPHEN 650 MG RE SUPP: 650.0000 mg | Freq: Four times a day (QID) | RECTAL | Status: DC | PRN

## 2023-07-25 MED ORDER — HYDROCODONE-ACETAMINOPHEN 5-325 MG PO TABS
1.0000 | ORAL_TABLET | Freq: Three times a day (TID) | ORAL | Status: DC | PRN
  Administered 2023-07-26 – 2023-07-27 (×2): 1 via ORAL
  Filled 2023-07-25 (×2): qty 1

## 2023-07-25 MED ORDER — ALBUTEROL SULFATE (2.5 MG/3ML) 0.083% IN NEBU
2.5000 mg | INHALATION_SOLUTION | Freq: Once | RESPIRATORY_TRACT | Status: AC
  Administered 2023-07-25: 2.5 mg via RESPIRATORY_TRACT
  Filled 2023-07-25: qty 3

## 2023-07-25 MED ORDER — ACETAMINOPHEN 325 MG PO TABS: 650.0000 mg | ORAL_TABLET | Freq: Four times a day (QID) | ORAL | Status: DC | PRN

## 2023-07-25 MED ORDER — ALBUTEROL SULFATE (2.5 MG/3ML) 0.083% IN NEBU
5.0000 mg | INHALATION_SOLUTION | Freq: Once | RESPIRATORY_TRACT | Status: AC
  Administered 2023-07-25: 5 mg via RESPIRATORY_TRACT
  Filled 2023-07-25: qty 6

## 2023-07-25 MED ORDER — ONDANSETRON HCL 4 MG PO TABS: 4.0000 mg | ORAL_TABLET | Freq: Four times a day (QID) | ORAL | Status: DC | PRN

## 2023-07-25 MED ORDER — ARFORMOTEROL TARTRATE 15 MCG/2ML IN NEBU
15.0000 ug | INHALATION_SOLUTION | Freq: Two times a day (BID) | RESPIRATORY_TRACT | Status: DC
  Administered 2023-07-25 – 2023-07-27 (×4): 15 ug via RESPIRATORY_TRACT
  Filled 2023-07-25 (×4): qty 2

## 2023-07-25 MED ORDER — ATORVASTATIN CALCIUM 40 MG PO TABS
40.0000 mg | ORAL_TABLET | Freq: Every day | ORAL | Status: DC
  Administered 2023-07-26 – 2023-07-27 (×2): 40 mg via ORAL
  Filled 2023-07-25 (×2): qty 1

## 2023-07-25 MED ORDER — SODIUM CHLORIDE 0.9% FLUSH
3.0000 mL | Freq: Two times a day (BID) | INTRAVENOUS | Status: DC
  Administered 2023-07-25 – 2023-07-26 (×3): 3 mL via INTRAVENOUS

## 2023-07-25 MED ORDER — INSULIN ASPART 100 UNIT/ML IJ SOLN
0.0000 [IU] | Freq: Every day | INTRAMUSCULAR | Status: DC
  Administered 2023-07-25: 2 [IU] via SUBCUTANEOUS

## 2023-07-25 NOTE — ED Notes (Signed)
 CCMD called and notified

## 2023-07-25 NOTE — Patient Instructions (Signed)
 Sorry to hear your breathing is not doing well.  You are in significant respiratory distress.  I think you need to go to the emergency room to be evaluated for observation and/or admission.  I cannot safely rule out the conditions that you could be at risk for in the hospital.  I would want you to have an echocardiogram and blood work to rule out heart failure or acute coronary syndrome.  I would also want you to have a CT scan to rule out pulmonary embolism.  I have given you a shot of Depo-Medrol 80 mg to try and help your breathing in the meantime.  Based on how your vital signs look right now I think it is safe for you to transport by car with your driver to the emergency room and await evaluation.

## 2023-07-25 NOTE — ED Notes (Signed)
 Pt states she absolutely refuses to use a Bipap she tried it one time in the hospital and never again. Pt states "you can forget it"

## 2023-07-25 NOTE — ED Provider Notes (Signed)
 Pt signed out by Dr. Criss Alvine pending CT chest.  CT chest reviewed by me and I agree with the radiologist.  CT:    No evidence of pulmonary embolism.  2. Small pleural effusion on the left and trace pleural effusion on  the right with atelectasis.  3. Scattered 3 mm pulmonary nodules bilaterally. No follow-up needed  if patient is low-risk (and has no known or suspected primary  neoplasm). Non-contrast chest CT can be considered in 12 months if  patient is high-risk. This recommendation follows the consensus  statement: Guidelines for Management of Incidental Pulmonary Nodules  Detected on CT Images: From the Fleischner Society 2017; Radiology  2017; 284:228-243.  4. Multinodular thyroid gland with coarse calcifications and  dominant nodule measuring up to 3 cm. Nonemergent thyroid ultrasound  is recommended.  5. Coronary artery calcifications and aortic atherosclerosis.    It took several hours to get the CT scan and get it read.  Pt has been given several more nebs and is still very sob.  She is not requiring any more oxygen than her baseline 4L.    Pt was given IM Decadron by Pulmonology prior to coming in.  Pt d/w Dr. Allena Katz (triad) for admission.   Jacalyn Lefevre, MD 07/25/23 2049

## 2023-07-25 NOTE — ED Notes (Signed)
 Patient transported to CT

## 2023-07-25 NOTE — ED Triage Notes (Signed)
 Pt came in via POV d/t a few weeks ago having some SOB more than usual. Wears 4L O2 via n/c at baseline & it has not been feeling any better so she came in for eval. States that its worse with exertion & cannot lay flat, A/Ox4, denies pain just feels weak.

## 2023-07-25 NOTE — ED Provider Notes (Signed)
 [place] EMERGENCY DEPARTMENT AT [facility] Provider Note   CSN: [id number] Arrival date & time: 07/25/23  1209     History  Chief Complaint  Patient presents with   Shortness of Breath    Caitlin Oliver is a 83 y.o. female.  HPI 83 year old female with a history of COPD on chronic 4 L of oxygen presents with shortness of breath.  Her symptoms have been getting worse for the past 3 weeks or so.  She has not had a fever though she did feel that she was getting sick when it first started.  She has a chronic cough that is unchanged from her baseline.  She has not had any chest pain but she feels all of her weak and feels like the shortness of breath is progressing.  She denies leg swelling, chest pain, or fever currently.  She has had some on and off abdominal discomfort but does not atypical for her.  She has a chronic headache that is unchanged from baseline.  She went to the pulmonology office and due to her shortness of breath and work of breathing they gave her a dose of IM Depo-Medrol and then sent her here for evaluation.  Patient notes that if she lays completely still at rest she will feel a lot less shortness of breath but any movement such as sitting up or walking causes significant dyspnea.  Home Medications Prior to Admission medications   Medication Sig Start Date End Date Taking? Authorizing Provider  acetaminophen (TYLENOL) 500 MG tablet Take 500 mg by mouth every 6 (six) hours as needed for moderate pain.   Yes [provider]  albuterol (PROVENTIL HFA;VENTOLIN HFA) 108 (90 Base) MCG/ACT inhaler Inhale 2 puffs into the lungs every 6 (six) hours as needed for wheezing or shortness of breath.  07/11/17  Yes [provider]  albuterol (PROVENTIL) (2.5 MG/3ML) 0.083% nebulizer solution Take 2.5 mg by nebulization in the morning and at bedtime.   Yes [provider]  Ascorbic Acid (VITAMIN C) 1000 MG tablet Take 1,000 mg by mouth daily.    Yes [provider]  atorvastatin (LIPITOR) 40 MG tablet Take 1 tablet (40 mg total) by mouth daily. 04/24/22  Yes   bisoprolol (ZEBETA) 5 MG tablet TAKE ONE-HALF TABLET BY MOUTH DAILY 08/30/22  Yes Bensimhon, Bevelyn Buckles, MD  BREZTRI AEROSPHERE 160-9-4.8 MCG/ACT AERO Inhale 2 puffs into the lungs in the morning and at bedtime. 01/26/23  Yes Charlott Holler, MD  carboxymethylcellul-glycerin (REFRESH OPTIVE) 0.5-0.9 % ophthalmic solution Place 1 drop into both eyes daily as needed for dry eyes.   Yes [provider]  Cholecalciferol (VITAMIN D3) 50 MCG (2000 UT) capsule Take 2,000 Units by mouth daily.   Yes [provider]  clopidogrel (PLAVIX) 75 MG tablet Take 1 tablet (75 mg total) by mouth daily. 04/24/22  Yes   docusate sodium (COLACE) 100 MG capsule Take 100 mg by mouth 2 (two) times daily as needed for mild constipation.   Yes [provider]  doxycycline (VIBRAMYCIN) 100 MG capsule Take 100 mg by mouth 2 (two) times daily. 06/26/23  Yes [provider]  escitalopram (LEXAPRO) 10 MG tablet Take 1 tablet (10 mg total) by mouth daily. 04/24/22  Yes   ferrous sulfate 325 (65 FE) MG tablet Take 1 tablet (325 mg total) by mouth daily with breakfast. 04/24/22  Yes   Folic Acid-Vit B6-Vit B12 (FOLBEE) 2.5-25-1 MG TABS tablet Take 1 tablet by mouth  daily.   Yes [provider]  HYDROcodone-acetaminophen (NORCO/VICODIN) 5-325 MG tablet Take 1 tablet by mouth every 8 (eight) hours as needed for moderate pain (pain score 4-6) or severe pain (pain score 7-10). 07/02/23  Yes [provider]  linagliptin (TRADJENTA) 5 MG TABS tablet Take 1 tablet (5 mg total) by mouth daily. 04/24/22  Yes   Magnesium 250 MG TABS Take 250 mg by mouth in the morning and at bedtime.   Yes [provider]  meclizine (ANTIVERT) 25 MG tablet Take 1 tablet (25 mg total) by mouth every morning. 04/24/22  Yes   Multiple Vitamin (MULTIVITAMIN WITH MINERALS) TABS tablet Take 1  tablet by mouth daily. 05/27/21  Yes Levin Erp, MD  naphazoline-glycerin (CLEAR EYES REDNESS) 0.012-0.25 % SOLN 1-2 drops 4 (four) times daily as needed for eye irritation.   Yes [provider]  ondansetron (ZOFRAN) 4 MG tablet Take 4 mg by mouth daily as needed for nausea.   Yes [provider]  OXYGEN Inhale 4 L/min into the lungs continuous.   Yes [provider]  pantoprazole (PROTONIX) 40 MG tablet Take 1 tablet (40 mg total) by mouth 2 (two) times daily. 04/24/22  Yes   polyethylene glycol (MIRALAX / GLYCOLAX) 17 g packet Take 17 g by mouth daily as needed for mild constipation. 12/02/18  Yes [provider]  potassium chloride SA (KLOR-CON M) 20 MEQ tablet TAKE ONE TABLET BY MOUTH EVERY DAY 08/30/22  Yes Bensimhon, Bevelyn Buckles, MD  senna (SENOKOT) 8.6 MG TABS tablet Take 1 tablet (8.6 mg total) by mouth daily. 02/04/22  Yes Mabe, Earvin Hansen, MD  torsemide (DEMADEX) 20 MG tablet Take 40 mg by mouth 2 (two) times daily.   Yes [provider]      Allergies    Fentanyl    Review of Systems   Review of Systems  Constitutional:  Negative for fever.  Respiratory:  Positive for cough (chronic) and shortness of breath.   Cardiovascular:  Negative for chest pain and leg swelling.  Neurological:  Positive for headaches (chronic).    Physical Exam Updated Vital Signs BP 131/79   Pulse 76   Temp 98.8 F (37.1 C) (Temporal)   Resp (!) 25   SpO2 100%  Physical Exam Vitals and nursing note reviewed.  Constitutional:      Appearance: She is well-developed.  HENT:     Head: Normocephalic and atraumatic.  Cardiovascular:     Rate and Rhythm: Normal rate and regular rhythm.     Heart sounds: Normal heart sounds.  Pulmonary:     Effort: Pulmonary effort is normal. Tachypnea present.     Breath sounds: Decreased breath sounds present.     Comments: Patient is tachypneic but able to provide history.  However any type of movement such as sitting up  in the bed seems to significantly exacerbate her dyspnea and cause increased work of breathing. Abdominal:     Palpations: Abdomen is soft.     Tenderness: There is no abdominal tenderness.  Musculoskeletal:     Right lower leg: No edema.     Left lower leg: No edema.  Skin:    General: Skin is warm and dry.  Neurological:     Mental Status: She is alert.     Comments: 5/5 strength in all 4 extremities.     ED Results / Procedures / Treatments   Labs (all labs ordered are listed, but only abnormal results are displayed) Labs Reviewed  CBC WITH DIFFERENTIAL/PLATELET - Abnormal; Notable for the following components:      Result Value   Neutro Abs 8.5 (*)    All other components within normal limits  COMPREHENSIVE METABOLIC PANEL WITH GFR - Abnormal; Notable for the following components:   Chloride 97 (*)    Glucose, Bld 107 (*)    BUN 26 (*)    Creatinine, Ser 1.36 (*)    Calcium 8.6 (*)    Total Protein 6.4 (*)    GFR, Estimated 39 (*)    All other components within normal limits  BRAIN NATRIURETIC PEPTIDE - Abnormal; Notable for the following components:   B Natriuretic Peptide 127.3 (*)    All other components within normal limits  RESP PANEL BY RT-PCR (RSV, FLU A&B, COVID)  [id number]  TROPONIN I (HIGH SENSITIVITY)  TROPONIN I (HIGH SENSITIVITY)    EKG EKG Interpretation Date/Time:  Thursday July 25 2023 12:27:50 EDT Ventricular Rate:  79 PR Interval:  161 QRS Duration:  105 QT Interval:  411 QTC Calculation: 469 R Axis:   62  Text Interpretation: Sinus rhythm Atrial premature complexes Poor data quality in current ECG precludes serial comparison Confirmed by Pricilla Loveless [phone]) on 07/25/2023 12:51:36 PM  Radiology DG Chest Portable 1 View Result Date: 07/25/2023 CLINICAL DATA:  Shortness of breath. EXAM: PORTABLE CHEST 1 VIEW COMPARISON:  Chest radiograph dated 06/30/2023. FINDINGS: The heart size and mediastinal contours are within normal limits. Aortic  atherosclerosis. Central pulmonary vascular congestion. Bibasilar interstitial prominence. No focal consolidation, sizeable pleural effusion, or pneumothorax. Vascular stents are again noted about the superior mediastinum. No acute osseous abnormality. IMPRESSION: Central pulmonary vascular congestion with bibasilar interstitial prominence, which could reflect edema or atypical infection. Electronically Signed   By: Hart Robinsons M.D.   On: 07/25/2023 15:20    Procedures Procedures    Medications Ordered in ED Medications  ipratropium-albuterol (DUONEB) 0.5-2.5 (3) MG/3ML nebulizer solution 3 mL (3 mLs Nebulization Given 07/25/23 1340)  albuterol (PROVENTIL) (2.5 MG/3ML) 0.083% nebulizer solution 2.5 mg (2.5 mg Nebulization Given 07/25/23 1324)    ED Course/ Medical Decision Making/ A&P                                 Medical Decision Making Amount and/or Complexity of Data Reviewed External Data Reviewed: notes.    Details: Pulmonology clinic visit today Labs: ordered.    Details: Nonspecific BNP Normal Hemoglobin Radiology: ordered and independent interpretation performed.    Details: No lobar pneumonia  Risk Prescription drug management.   Patient presents with dyspnea from the pulmonology office. She is not in distress while at rest. Has significant tachypnea when sitting up. Feels better with albuterol, on my exam she has increased air movement without wheezing.  Will get CTA as have been probably recommended by pulmonology in the clinic today.  Otherwise, workup is still pending.  Care transferred to Dr. Particia Nearing.        Final Clinical Impression(s) / ED Diagnoses Final diagnoses:  None    Rx / DC Orders ED Discharge Orders     None         Pricilla Loveless, MD 07/25/23 1555

## 2023-07-25 NOTE — H&P (Signed)
 History and Physical    Caitlin Oliver ZOX:[id number] DOB: 01/17/1941 DOA: 07/25/2023  PCP: Richmond Campbell., PA-C  Patient coming from: Home  I have personally briefly reviewed patient's old medical records in [facility]  Chief Complaint: Shortness of breath  HPI: Caitlin Oliver is a 83 y.o. female with medical history significant for COPD with chronic hypoxic respiratory failure on 4 L O2 via [place], CKD stage IIIb, T2DM, HTN, HLD, history of CVA, CAS s/p b/l CEA, NSCLC s/p left upper lobectomy 2017 who presented to the ED for evaluation of shortness of breath.  Patient reports persistent shortness of breath for the last 3 weeks.  She says that she has been using her home nebulizers without improvement.  She says she wears 4 L supplemental O2 via [place] at all times.  She has a chronic cough which is unchanged from her baseline.  She denies any chest pain.  She says that she has been feeling generally weak but has been able to get around with her rollator and home.  She has not been eating as much lately.  She reports having a chronic headache.  Patient was seen in the pulmonology office and was noted to have significant shortness of breath and increased work of breathing.  She was given IM Depo-Medrol in clinic and sent to the ED for further evaluation.  ED Course  Labs/Imaging on admission: I have personally reviewed following labs and imaging studies.  Initial vitals showed BP 132/85, pulse 78, RR 22, temp 98.8 F, SpO2 100% on 4 L O2 via .  Labs showed WBC 10.4, hemoglobin 12.3, platelets 264,000, sodium 135, potassium 3.9, bicarb 25, BUN 26, creatinine 1.36, serum glucose 107, LFTs within normal limits, BNP 127.3.  SARS-CoV-2, influenza, RSV PCR negative.  CTA chest negative for evidence of PE.  Small pleural effusion on the left and trace pleural effusion on the right with atelectasis noted.  Scattered 3 mm pulmonary nodules bilaterally.  Multinodular thyroid gland with coarse  calcifications and dominant nodule measuring up to 3 cm noted.  Patient was given DuoNeb and albuterol nebulizer treatments without significant improvement in respiratory status.  The hospitalist service was consulted to admit.  Review of Systems: All systems reviewed and are negative except as documented in history of present illness above.   Past Medical History:  Diagnosis Date   Anxiety    Arthritis    Cancer (HCC)    lung cancer   Carotid artery disease (HCC)    Cataracts, bilateral    CHF (congestive heart failure) (HCC)    Claudication (HCC)    COPD (chronic obstructive pulmonary disease) (HCC)    Diabetes mellitus    Dizzy    Family history of adverse reaction to anesthesia    Pt nephew has PONV   GERD (gastroesophageal reflux disease)    Headache    History of kidney stones    Hypercholesteremia    Hypertension    Peripheral arterial disease (HCC)    bilateral iliac artery stenosis by angiography   Pneumonia    Stomach ulcer    Stroke (HCC)    Tobacco abuse    Wears glasses     Past Surgical History:  Procedure Laterality Date   ABDOMINAL AORTIC ENDOVASCULAR STENT GRAFT Right 06/19/2019   Procedure: INNOMINATE STENT, RIGHT CAROTID STENT, RIGHT SUBCLAVIAN STENT AND CAROTID CUTDOWN;  Surgeon: Cephus Shelling, MD;  Location: [facility] OR;  Service: Vascular;  Laterality: Right;   ABDOMINAL HYSTERECTOMY  APPENDECTOMY     BREAST REDUCTION SURGERY     CAROTID ANGIOGRAM N/A 02/25/2013   Procedure: CAROTID ANGIOGRAM;  Surgeon: Runell Gess, MD;  Location: [facility] CATH LAB;  Service: Cardiovascular;  Laterality: N/A;   ENDARTERECTOMY Right 03/06/2013   Procedure: ENDARTERECTOMY CAROTID-RIGHT;  Surgeon: Nada Libman, MD;  Location: [facility] OR;  Service: Vascular;  Laterality: Right;   ENDARTERECTOMY Left 05/07/2013   Procedure: LEFT CAROTID ARTERY ENDARTERECTOMY WITH VASCU-GUARD PATCH ANGIOPLASTY ;  Surgeon: Nada Libman, MD;  Location: [facility] OR;  Service: Vascular;   Laterality: Left;   INTRAOPERATIVE ARTERIOGRAM N/A 06/19/2019   Procedure: Sharlette Dense;  Surgeon: Cephus Shelling, MD;  Location: [facility] OR;  Service: Vascular;  Laterality: N/A;   IR ANGIO INTRA EXTRACRAN SEL COM CAROTID INNOMINATE UNI R MOD SED  05/14/2019   IR ANGIO VERTEBRAL SEL SUBCLAVIAN INNOMINATE BILAT MOD SED  05/14/2019   LOBECTOMY Left 02/03/2016   Procedure: LEFT UPPER LOBECTOMY;  Surgeon: Loreli Slot, MD;  Location: [facility] OR;  Service: Thoracic;  Laterality: Left;   LOWER EXTREMITY ANGIOGRAM N/A 02/25/2013   Procedure: LOWER EXTREMITY ANGIOGRAM;  Surgeon: Runell Gess, MD;  Location: [facility] CATH LAB;  Service: Cardiovascular;  Laterality: N/A;   LOWER EXTREMITY ANGIOGRAM N/A 07/27/2013   Procedure: LOWER EXTREMITY ANGIOGRAM;  Surgeon: Runell Gess, MD;  Location: [facility] CATH LAB;  Service: Cardiovascular;  Laterality: N/A;   PATCH ANGIOPLASTY Right 03/06/2013   Procedure: PATCH ANGIOPLASTY of Right Carotid Artery using Vascu-Guard Patch;  Surgeon: Nada Libman, MD;  Location: [facility] OR;  Service: Vascular;  Laterality: Right;   PERIPHERAL VASCULAR CATHETERIZATION N/A 11/15/2015   Procedure: Aortic Arch Angiography;  Surgeon: Nada Libman, MD;  Location: [facility] INVASIVE CV LAB;  Service: Cardiovascular;  Laterality: N/A;   PERIPHERAL VASCULAR CATHETERIZATION Bilateral 11/15/2015   Procedure: Carotid Angiography;  Surgeon: Nada Libman, MD;  Location: [facility] INVASIVE CV LAB;  Service: Cardiovascular;  Laterality: Bilateral;   PERIPHERAL VASCULAR CATHETERIZATION Left 11/15/2015   Procedure: Upper Extremity Angiography;  Surgeon: Nada Libman, MD;  Location: [facility] INVASIVE CV LAB;  Service: Cardiovascular;  Laterality: Left;   PERIPHERAL VASCULAR CATHETERIZATION Left 11/15/2015   Procedure: Peripheral Vascular Intervention;  Surgeon: Nada Libman, MD;  Location: [facility] INVASIVE CV LAB;  Service: Cardiovascular;  Laterality: Left;  subclavian    RIGHT HEART CATH N/A 06/16/2019   Procedure: RIGHT  HEART CATH;  Surgeon: Dolores Patty, MD;  Location: [facility] INVASIVE CV LAB;  Service: Cardiovascular;  Laterality: N/A;   RIGHT/LEFT HEART CATH AND CORONARY ANGIOGRAPHY N/A 11/20/2018   Procedure: RIGHT/LEFT HEART CATH AND CORONARY ANGIOGRAPHY;  Surgeon: Yvonne Kendall, MD;  Location: [facility] INVASIVE CV LAB;  Service: Cardiovascular;  Laterality: N/A;   SALIVARY GLAND SURGERY     scar tissue removed from left saliva glad   TIBIA IM NAIL INSERTION Right 05/23/2021   Procedure: INTRAMEDULLARY (IM) NAIL TIBIAL;  Surgeon: Myrene Galas, MD;  Location: [facility] OR;  Service: Orthopedics;  Laterality: Right;   TUBAL LIGATION     ULTRASOUND GUIDANCE FOR VASCULAR ACCESS Right 06/19/2019   Procedure: Ultrasound Guidance For Vascular Access;  Surgeon: Cephus Shelling, MD;  Location: [facility] OR;  Service: Vascular;  Laterality: Right;   VIDEO ASSISTED THORACOSCOPY (VATS)/WEDGE RESECTION Left 02/03/2016   Procedure: VIDEO ASSISTED THORACOSCOPY;  Surgeon: Loreli Slot, MD;  Location: [facility] OR;  Service: Thoracic;  Laterality: Left;    Social History: Social History   Tobacco Use   Smoking status:  Former    Current packs/day: 0.00    Average packs/day: 1.5 packs/day for 56.0 years (84.0 ttl pk-yrs)    Types: Cigarettes    Start date: 07/07/1957    Quit date: 07/07/2013    Years since quitting: 10.0   Smokeless tobacco: Former    Quit date: 02/25/2013  Vaping Use   Vaping status: Some Days  Substance Use Topics   Alcohol use: No   Drug use: No   Allergies  Allergen Reactions   Fentanyl Shortness Of Breath    Family History  Problem Relation Age of Onset   Stroke Mother    Hypertension Mother    Kidney failure Father    Lung disease Neg Hx      Prior to Admission medications   Medication Sig Start Date End Date Taking? Authorizing Provider  acetaminophen (TYLENOL) 500 MG tablet Take 500 mg by mouth every 6 (six) hours as needed for moderate pain.   Yes [provider]  albuterol  (PROVENTIL HFA;VENTOLIN HFA) 108 (90 Base) MCG/ACT inhaler Inhale 2 puffs into the lungs every 6 (six) hours as needed for wheezing or shortness of breath.  07/11/17  Yes [provider]  albuterol (PROVENTIL) (2.5 MG/3ML) 0.083% nebulizer solution Take 2.5 mg by nebulization in the morning and at bedtime.   Yes [provider]  Ascorbic Acid (VITAMIN C) 1000 MG tablet Take 1,000 mg by mouth daily.   Yes [provider]  atorvastatin (LIPITOR) 40 MG tablet Take 1 tablet (40 mg total) by mouth daily. 04/24/22  Yes   bisoprolol (ZEBETA) 5 MG tablet TAKE ONE-HALF TABLET BY MOUTH DAILY 08/30/22  Yes Bensimhon, Bevelyn Buckles, MD  BREZTRI AEROSPHERE 160-9-4.8 MCG/ACT AERO Inhale 2 puffs into the lungs in the morning and at bedtime. 01/26/23  Yes Charlott Holler, MD  carboxymethylcellul-glycerin (REFRESH OPTIVE) 0.5-0.9 % ophthalmic solution Place 1 drop into both eyes daily as needed for dry eyes.   Yes [provider]  Cholecalciferol (VITAMIN D3) 50 MCG (2000 UT) capsule Take 2,000 Units by mouth daily.   Yes [provider]  clopidogrel (PLAVIX) 75 MG tablet Take 1 tablet (75 mg total) by mouth daily. 04/24/22  Yes   docusate sodium (COLACE) 100 MG capsule Take 100 mg by mouth 2 (two) times daily as needed for mild constipation.   Yes [provider]  doxycycline (VIBRAMYCIN) 100 MG capsule Take 100 mg by mouth 2 (two) times daily. 06/26/23  Yes [provider]  escitalopram (LEXAPRO) 10 MG tablet Take 1 tablet (10 mg total) by mouth daily. 04/24/22  Yes   ferrous sulfate 325 (65 FE) MG tablet Take 1 tablet (325 mg total) by mouth daily with breakfast. 04/24/22  Yes   Folic Acid-Vit B6-Vit B12 (FOLBEE) 2.5-25-1 MG TABS tablet Take 1 tablet by mouth daily.   Yes [provider]  HYDROcodone-acetaminophen (NORCO/VICODIN) 5-325 MG tablet Take 1 tablet by mouth every 8 (eight) hours as needed for moderate pain (pain score 4-6) or severe pain (pain score  7-10). 07/02/23  Yes [provider]  linagliptin (TRADJENTA) 5 MG TABS tablet Take 1 tablet (5 mg total) by mouth daily. 04/24/22  Yes   Magnesium 250 MG TABS Take 250 mg by mouth in the morning and at bedtime.   Yes [provider]  meclizine (ANTIVERT) 25 MG tablet Take 1 tablet (25 mg total) by mouth every morning. 04/24/22  Yes   Multiple Vitamin (MULTIVITAMIN WITH MINERALS) TABS tablet Take 1 tablet  by mouth daily. 05/27/21  Yes Levin Erp, MD  naphazoline-glycerin (CLEAR EYES REDNESS) 0.012-0.25 % SOLN 1-2 drops 4 (four) times daily as needed for eye irritation.   Yes [provider]  ondansetron (ZOFRAN) 4 MG tablet Take 4 mg by mouth daily as needed for nausea.   Yes [provider]  OXYGEN Inhale 4 L/min into the lungs continuous.   Yes [provider]  pantoprazole (PROTONIX) 40 MG tablet Take 1 tablet (40 mg total) by mouth 2 (two) times daily. 04/24/22  Yes   polyethylene glycol (MIRALAX / GLYCOLAX) 17 g packet Take 17 g by mouth daily as needed for mild constipation. 12/02/18  Yes [provider]  potassium chloride SA (KLOR-CON M) 20 MEQ tablet TAKE ONE TABLET BY MOUTH EVERY DAY 08/30/22  Yes Bensimhon, Bevelyn Buckles, MD  senna (SENOKOT) 8.6 MG TABS tablet Take 1 tablet (8.6 mg total) by mouth daily. 02/04/22  Yes Mabe, Earvin Hansen, MD  torsemide (DEMADEX) 20 MG tablet Take 40 mg by mouth 2 (two) times daily.   Yes [provider]    Physical Exam: Vitals:   07/25/23 1715 07/25/23 1730 07/25/23 2003 07/25/23 2034  BP:  138/83 109/69 113/79  Pulse: 93 94 95 100  Resp: (!) 27 (!) 37 (!) 29 (!) 25  Temp:    97.8 F (36.6 C)  TempSrc:    Temporal  SpO2: 100% 100% 99% 99%   Constitutional: Resting in bed with head elevated.  NAD, calm Eyes: EOMI, lids and conjunctivae normal ENMT: Mucous membranes are moist. Posterior pharynx clear of any exudate or lesions.Normal dentition.  Neck: normal, supple, no masses. Respiratory: Coarse  expiratory wheezing throughout.  Increased respiratory effort while on 3 L O2 via [place]. No accessory muscle use.  Cardiovascular: Regular rate and rhythm, no murmurs / rubs / gallops. No extremity edema. 2+ pedal pulses. Abdomen: no tenderness, no masses palpated. Musculoskeletal: no clubbing / cyanosis. No joint deformity upper and lower extremities. Good ROM, no contractures. Normal muscle tone.  Skin: no rashes, lesions, ulcers. No induration Neurologic: Sensation intact. Strength 5/5 in all 4.  Psychiatric: Normal judgment and insight. Alert and oriented x 3. Normal mood.   EKG: Personally reviewed. Sinus rhythm, rate 73, no acute ischemic changes.  PAC present.  Assessment/Plan Principal Problem:   COPD with acute exacerbation (HCC) Active Problems:   Chronic respiratory failure with hypoxia (HCC)   Hypertension associated with diabetes (HCC)   Type 2 diabetes mellitus (HCC)   Hyperlipidemia associated with type 2 diabetes mellitus (HCC)   Chronic pain   Chronic kidney disease, stage 3b (HCC)   History of CVA (cerebrovascular accident)   Pulmonary nodules   Multinodular thyroid   Caitlin Oliver is a 83 y.o. female with medical history significant for COPD with chronic hypoxic respiratory failure on 4 L O2 via [place], CKD stage IIIb, T2DM, HTN, HLD, history of CVA, CAS s/p b/l CEA, NSCLC s/p left upper lobectomy 2017 who is admitted with acute COPD exacerbation.  Assessment and Plan: COPD with acute exacerbation: Presenting with persistent shortness of breath and significant wheezing on admission despite several nebulizer treatments.  SpO2 is stable on home 4 L O2 via [place]. - Start scheduled Brovana/Pulmicort - DuoNebs as needed - IV Solu-Medrol 40 mg twice daily - Continue supplemental oxygen 3-4 L O2 via [place] - BiPAP if needed for increased work of breathing  Chronic respiratory failure with hypoxia: SpO2 is stable on 3-4 L O2 via [place].  Patient  wears 4 L [place] at all times at home.  CKD  stage IIIb: Renal function stable.  Appears somewhat hypovolemic.  Will hold torsemide.  Type 2 diabetes: Last hemoglobin A1c 7.3% 04/16/2023.  Holding Tradjenta and placed on SSI.  Hypertension: Continue bisoprolol.  History of CVA: Continue Plavix and atorvastatin.  Hyperlipidemia: Continue atorvastatin.  Depression/anxiety: Continue Lexapro.  Chronic pain: Continue home Norco.   Bilateral pulmonary nodules History of non-small cell lung cancer s/p left upper lobectomy 2017: Scattered 3 mm pulmonary nodules bilaterally noted on CTA chest.  Patient has prior history of lung cancer.  Recommend follow-up with pulmonology and noncontrast chest CT in 12 months should be considered.  Multinodular thyroid: Multinodular thyroid gland with coarse calcifications and dominant nodule measuring up to 3 cm incidentally noted on CT imaging.  Nonemergent thyroid ultrasound is recommended if not already obtained on an outpatient basis.   DVT prophylaxis: heparin injection 5,000 Units Start: 07/25/23 2200 Code Status:   Code Status: Do not attempt resuscitation (DNR) - Comfort care confirmed with patient on admission, MOST form reviewed. Family Communication: Discussed with patient, she has discussed with family Disposition Plan: From home and likely discharge to home pending clinical progress Consults called: None Severity of Illness: The appropriate patient status for this patient is OBSERVATION. Observation status is judged to be reasonable and necessary in order to provide the required intensity of service to ensure the patient's safety. The patient's presenting symptoms, physical exam findings, and initial radiographic and laboratory data in the context of their medical condition is felt to place them at decreased risk for further clinical deterioration. Furthermore, it is anticipated that the patient will be medically stable for discharge from the hospital within 2 midnights of admission.    Darreld Mclean MD [facility]  If 7PM-7AM, please contact night-coverage [email or web address]  07/25/2023, 9:25 PM

## 2023-07-25 NOTE — Hospital Course (Signed)
 Caitlin Oliver is a 83 y.o. female with medical history significant for COPD with chronic hypoxic respiratory failure on 4 L O2 via [place], CKD stage IIIb, T2DM, HTN, HLD, history of CVA, CAS s/p b/l CEA, NSCLC s/p left upper lobectomy 2017 who is admitted with acute COPD exacerbation.

## 2023-07-25 NOTE — Progress Notes (Signed)
 Caitlin Oliver    [id number]    02/13/1941  Primary Care Physician:Kaplan, Isidor Holts., PA-C Date of Appointment: 07/25/2023 Established Patient Visit  Chief complaint:   Chief Complaint  Patient presents with   Follow-up     HPI: Caitlin Oliver is a 83 y.o. woman with past medical history of COPD on home oxygen 4LNC.  Interval Updates: Here for acute visit. Was called in some prednisone in February by our office and again in march after ED visit.  Says her breathing has worsened. This feels much worse than preivious episodes. Symptoms going on for three weeks. Fatigued, aching all over, unable to walk due to dyspnea. This also feels worse compared to her march ed visit. No fevers chills coughing wheezing sick contacts.  Feeling like she's having a hard time getting a deep breath in.   Current regimen: breztri 2 puffs twice daily.   Doing albuterol nebs twice daily. Using breztri 2 puffs twice daily.   She wishes she could take albuterol more frequently - she thinks she would take it ever 30 minutes. Unclear if albuterol is helping.   She has hired help at home for her meals. Sleeps in her recliner due to difficulty laying flat  I have reviewed the patient's family social and past medical history and updated as appropriate.   Past Medical History:  Diagnosis Date   Anxiety    Arthritis    Cancer (HCC)    lung cancer   Carotid artery disease (HCC)    Cataracts, bilateral    CHF (congestive heart failure) (HCC)    Claudication (HCC)    COPD (chronic obstructive pulmonary disease) (HCC)    Diabetes mellitus    Dizzy    Family history of adverse reaction to anesthesia    Pt nephew has PONV   GERD (gastroesophageal reflux disease)    Headache    History of kidney stones    Hypercholesteremia    Hypertension    Peripheral arterial disease (HCC)    bilateral iliac artery stenosis by angiography   Pneumonia    Stomach ulcer    Stroke (HCC)    Tobacco abuse     Wears glasses     Past Surgical History:  Procedure Laterality Date   ABDOMINAL AORTIC ENDOVASCULAR STENT GRAFT Right 06/19/2019   Procedure: INNOMINATE STENT, RIGHT CAROTID STENT, RIGHT SUBCLAVIAN STENT AND CAROTID CUTDOWN;  Surgeon: Cephus Shelling, MD;  Location: [facility] OR;  Service: Vascular;  Laterality: Right;   ABDOMINAL HYSTERECTOMY     APPENDECTOMY     BREAST REDUCTION SURGERY     CAROTID ANGIOGRAM N/A 02/25/2013   Procedure: CAROTID ANGIOGRAM;  Surgeon: Runell Gess, MD;  Location: [facility] CATH LAB;  Service: Cardiovascular;  Laterality: N/A;   ENDARTERECTOMY Right 03/06/2013   Procedure: ENDARTERECTOMY CAROTID-RIGHT;  Surgeon: Nada Libman, MD;  Location: [facility] OR;  Service: Vascular;  Laterality: Right;   ENDARTERECTOMY Left 05/07/2013   Procedure: LEFT CAROTID ARTERY ENDARTERECTOMY WITH VASCU-GUARD PATCH ANGIOPLASTY ;  Surgeon: Nada Libman, MD;  Location: [facility] OR;  Service: Vascular;  Laterality: Left;   INTRAOPERATIVE ARTERIOGRAM N/A 06/19/2019   Procedure: Sharlette Dense;  Surgeon: Cephus Shelling, MD;  Location: [facility] OR;  Service: Vascular;  Laterality: N/A;   IR ANGIO INTRA EXTRACRAN SEL COM CAROTID INNOMINATE UNI R MOD SED  05/14/2019   IR ANGIO VERTEBRAL SEL SUBCLAVIAN INNOMINATE BILAT MOD SED  05/14/2019   LOBECTOMY Left 02/03/2016  Procedure: LEFT UPPER LOBECTOMY;  Surgeon: Loreli Slot, MD;  Location: [facility] OR;  Service: Thoracic;  Laterality: Left;   LOWER EXTREMITY ANGIOGRAM N/A 02/25/2013   Procedure: LOWER EXTREMITY ANGIOGRAM;  Surgeon: Runell Gess, MD;  Location: [facility] CATH LAB;  Service: Cardiovascular;  Laterality: N/A;   LOWER EXTREMITY ANGIOGRAM N/A 07/27/2013   Procedure: LOWER EXTREMITY ANGIOGRAM;  Surgeon: Runell Gess, MD;  Location: [facility] CATH LAB;  Service: Cardiovascular;  Laterality: N/A;   PATCH ANGIOPLASTY Right 03/06/2013   Procedure: PATCH ANGIOPLASTY of Right Carotid Artery using Vascu-Guard Patch;  Surgeon: Nada Libman, MD;   Location: [facility] OR;  Service: Vascular;  Laterality: Right;   PERIPHERAL VASCULAR CATHETERIZATION N/A 11/15/2015   Procedure: Aortic Arch Angiography;  Surgeon: Nada Libman, MD;  Location: [facility] INVASIVE CV LAB;  Service: Cardiovascular;  Laterality: N/A;   PERIPHERAL VASCULAR CATHETERIZATION Bilateral 11/15/2015   Procedure: Carotid Angiography;  Surgeon: Nada Libman, MD;  Location: [facility] INVASIVE CV LAB;  Service: Cardiovascular;  Laterality: Bilateral;   PERIPHERAL VASCULAR CATHETERIZATION Left 11/15/2015   Procedure: Upper Extremity Angiography;  Surgeon: Nada Libman, MD;  Location: [facility] INVASIVE CV LAB;  Service: Cardiovascular;  Laterality: Left;   PERIPHERAL VASCULAR CATHETERIZATION Left 11/15/2015   Procedure: Peripheral Vascular Intervention;  Surgeon: Nada Libman, MD;  Location: [facility] INVASIVE CV LAB;  Service: Cardiovascular;  Laterality: Left;  subclavian    RIGHT HEART CATH N/A 06/16/2019   Procedure: RIGHT HEART CATH;  Surgeon: Dolores Patty, MD;  Location: [facility] INVASIVE CV LAB;  Service: Cardiovascular;  Laterality: N/A;   RIGHT/LEFT HEART CATH AND CORONARY ANGIOGRAPHY N/A 11/20/2018   Procedure: RIGHT/LEFT HEART CATH AND CORONARY ANGIOGRAPHY;  Surgeon: Yvonne Kendall, MD;  Location: [facility] INVASIVE CV LAB;  Service: Cardiovascular;  Laterality: N/A;   SALIVARY GLAND SURGERY     scar tissue removed from left saliva glad   TIBIA IM NAIL INSERTION Right 05/23/2021   Procedure: INTRAMEDULLARY (IM) NAIL TIBIAL;  Surgeon: Myrene Galas, MD;  Location: [facility] OR;  Service: Orthopedics;  Laterality: Right;   TUBAL LIGATION     ULTRASOUND GUIDANCE FOR VASCULAR ACCESS Right 06/19/2019   Procedure: Ultrasound Guidance For Vascular Access;  Surgeon: Cephus Shelling, MD;  Location: [facility] OR;  Service: Vascular;  Laterality: Right;   VIDEO ASSISTED THORACOSCOPY (VATS)/WEDGE RESECTION Left 02/03/2016   Procedure: VIDEO ASSISTED THORACOSCOPY;  Surgeon: Loreli Slot, MD;  Location: [facility] OR;  Service:  Thoracic;  Laterality: Left;    Family History  Problem Relation Age of Onset   Stroke Mother    Hypertension Mother    Kidney failure Father    Lung disease Neg Hx     Social History   Occupational History   Not on file  Tobacco Use   Smoking status: Former    Current packs/day: 0.00    Average packs/day: 1.5 packs/day for 56.0 years (84.0 ttl pk-yrs)    Types: Cigarettes    Start date: 07/07/1957    Quit date: 07/07/2013    Years since quitting: 10.0   Smokeless tobacco: Former    Quit date: 02/25/2013  Vaping Use   Vaping status: Some Days  Substance and Sexual Activity   Alcohol use: No   Drug use: No   Sexual activity: Never     Physical Exam: Blood pressure 109/60, pulse 84, height 5\' 4"  (1.626 m), weight 157 lb 9.6 oz (71.5 kg), SpO2 92%.  Gen:   Elderly, frail, in wheelchair, on nasal cannula  Lungs: Tachypneic, respiratory rate over 30 times a minute.  Breath sounds are diminished, no wheezes or crackles she appears to be gasping for breath but is able to speak in sentences with breaks CV:       Regular rate and rhythm   Data Reviewed: Imaging: I have personally reviewed the chest xray apirl 2024 consistent with chronic copd.   PFTs:     Latest Ref Rng & Units 02/19/2019    3:21 PM 12/15/2015   10:48 AM  PFT Results  FVC-Pre L 2.03  2.49   FVC-Predicted Pre % 75  88   FVC-Post L  2.69   FVC-Predicted Post %  95   Pre FEV1/FVC % % 62  71   Post FEV1/FCV % %  72   FEV1-Pre L 1.25  1.77   FEV1-Predicted Pre % 62  84   FEV1-Post L  1.92   DLCO uncorrected ml/min/mmHg 9.75  16.88   DLCO UNC% % 51  69   DLVA Predicted % 63  80   TLC L 5.65  5.76   TLC % Predicted % 111  114   RV % Predicted % 158  144    I have personally reviewed the patient's PFTs and moderate airflow limitation with evidence of hyperinflation and air trapping.  Labs: Lab Results  Component Value Date   NA 132 (L) 06/30/2023   K 4.1 06/30/2023   CO2 27 06/30/2023   GLUCOSE  181 (H) 06/30/2023   BUN 33 (H) 06/30/2023   CREATININE 1.47 (H) 06/30/2023   CALCIUM 8.9 06/30/2023   GFR 59.78 (L) 11/13/2018   EGFR >60 03/12/2017   GFRNONAA 35 (L) 06/30/2023   Lab Results  Component Value Date   WBC 9.6 06/30/2023   HGB 12.3 06/30/2023   HCT 37.4 06/30/2023   MCV 89.7 06/30/2023   PLT 258 06/30/2023    Immunization status: Immunization History  Administered Date(s) Administered   Fluad Quad(high Dose 65+) 02/07/2022   Influenza, High Dose Seasonal PF 01/17/2016, 01/28/2017, 01/16/2018, 01/30/2019, 03/08/2020, 01/09/2021, 02/07/2022, 01/31/2023   Influenza-Unspecified 01/31/2016   Moderna Covid-19 Fall Seasonal Vaccine 26yrs & older 03/26/2022   Moderna Sars-Covid-2 Vaccination 01/31/2023   PFIZER Comirnaty(Gray Top)Covid-19 Tri-Sucrose Vaccine 10/12/2020   PFIZER(Purple Top)SARS-COV-2 Vaccination 10/08/2019, 10/28/2019   PNEUMOCOCCAL CONJUGATE-20 03/26/2022   Pfizer Covid-19 Vaccine Bivalent Booster 59yrs & up 03/30/2021   Pneumococcal Conjugate-13 02/09/2015   Pneumococcal Polysaccharide-23 05/08/2013   Rsv, Bivalent, Protein Subunit Rsvpref,pf Verdis Frederickson) 10/23/2022   Tdap 05/05/2021   Zoster Recombinant(Shingrix) 10/21/2017, 10/23/2022   Zoster, Live 05/14/2011    External Records Personally Reviewed: pulmonary  Assessment:  Moderate COPD FEV1 62% of predicted (worsening PFTs from 2017 to 2020) with frequent exacerbations, not well controlled with acute exacerbation Chronic respiratory failure on 3LNC History of tobacco use disorder  Plan/Recommendations:  Caitlin Oliver has had substantial clinical decline over the last 6 months.  When I saw her last fall she was doing okay on the [place] and as needed albuterol.  Something happened in February and now she is having worsening shortness of breath.  She had an exacerbation in February and in March both seem to improve with steroids.  However today she is in substantial respiratory distress.  I do  not feel I can safely rule out possible etiologies in an outpatient setting.    She is at risk for pulmonary embolism given her chronic lung disease and relative immobility.  She is also at risk for acute coronary  syndrome as well as heart failure.  She does not appear overtly volume overloaded today but I think some labs would be useful.  I would get a CT PE study.  I am concerned that the fact that albuterol does not seem to be improving her symptoms suggest a nonpulmonary etiology of her symptoms.  However if given a shot of Depo-Medrol in the office just to get her going to see if that helps her symptoms.     Outside the window for lung cancer screening based on age.   Return to Care: Return in about 3 months (around 10/24/2023).   Durel Salts, MD Pulmonary and Critical Care Medicine [facility] Office:[phone]

## 2023-07-26 ENCOUNTER — Observation Stay (HOSPITAL_COMMUNITY)

## 2023-07-26 DIAGNOSIS — K219 Gastro-esophageal reflux disease without esophagitis: Secondary | ICD-10-CM | POA: Diagnosis present

## 2023-07-26 DIAGNOSIS — E1122 Type 2 diabetes mellitus with diabetic chronic kidney disease: Secondary | ICD-10-CM | POA: Diagnosis present

## 2023-07-26 DIAGNOSIS — N1832 Chronic kidney disease, stage 3b: Secondary | ICD-10-CM | POA: Diagnosis present

## 2023-07-26 DIAGNOSIS — G8929 Other chronic pain: Secondary | ICD-10-CM | POA: Diagnosis present

## 2023-07-26 DIAGNOSIS — E042 Nontoxic multinodular goiter: Secondary | ICD-10-CM | POA: Diagnosis present

## 2023-07-26 DIAGNOSIS — E1165 Type 2 diabetes mellitus with hyperglycemia: Secondary | ICD-10-CM | POA: Diagnosis present

## 2023-07-26 DIAGNOSIS — Z9981 Dependence on supplemental oxygen: Secondary | ICD-10-CM | POA: Diagnosis not present

## 2023-07-26 DIAGNOSIS — R0602 Shortness of breath: Secondary | ICD-10-CM | POA: Diagnosis present

## 2023-07-26 DIAGNOSIS — R0609 Other forms of dyspnea: Secondary | ICD-10-CM

## 2023-07-26 DIAGNOSIS — J441 Chronic obstructive pulmonary disease with (acute) exacerbation: Secondary | ICD-10-CM | POA: Diagnosis present

## 2023-07-26 DIAGNOSIS — E1169 Type 2 diabetes mellitus with other specified complication: Secondary | ICD-10-CM | POA: Diagnosis present

## 2023-07-26 DIAGNOSIS — Z79899 Other long term (current) drug therapy: Secondary | ICD-10-CM | POA: Diagnosis not present

## 2023-07-26 DIAGNOSIS — F17290 Nicotine dependence, other tobacco product, uncomplicated: Secondary | ICD-10-CM | POA: Diagnosis present

## 2023-07-26 DIAGNOSIS — I509 Heart failure, unspecified: Secondary | ICD-10-CM | POA: Diagnosis present

## 2023-07-26 DIAGNOSIS — Z7951 Long term (current) use of inhaled steroids: Secondary | ICD-10-CM | POA: Diagnosis not present

## 2023-07-26 DIAGNOSIS — J9611 Chronic respiratory failure with hypoxia: Secondary | ICD-10-CM | POA: Diagnosis present

## 2023-07-26 DIAGNOSIS — E861 Hypovolemia: Secondary | ICD-10-CM | POA: Diagnosis present

## 2023-07-26 DIAGNOSIS — Z1152 Encounter for screening for COVID-19: Secondary | ICD-10-CM | POA: Diagnosis not present

## 2023-07-26 DIAGNOSIS — E7800 Pure hypercholesterolemia, unspecified: Secondary | ICD-10-CM | POA: Diagnosis present

## 2023-07-26 DIAGNOSIS — F32A Depression, unspecified: Secondary | ICD-10-CM | POA: Diagnosis present

## 2023-07-26 DIAGNOSIS — Z95820 Peripheral vascular angioplasty status with implants and grafts: Secondary | ICD-10-CM | POA: Diagnosis not present

## 2023-07-26 DIAGNOSIS — Z7902 Long term (current) use of antithrombotics/antiplatelets: Secondary | ICD-10-CM | POA: Diagnosis not present

## 2023-07-26 DIAGNOSIS — I152 Hypertension secondary to endocrine disorders: Secondary | ICD-10-CM | POA: Diagnosis present

## 2023-07-26 DIAGNOSIS — Z66 Do not resuscitate: Secondary | ICD-10-CM | POA: Diagnosis present

## 2023-07-26 DIAGNOSIS — Z7984 Long term (current) use of oral hypoglycemic drugs: Secondary | ICD-10-CM | POA: Diagnosis not present

## 2023-07-26 DIAGNOSIS — E1151 Type 2 diabetes mellitus with diabetic peripheral angiopathy without gangrene: Secondary | ICD-10-CM | POA: Diagnosis present

## 2023-07-26 LAB — TSH: TSH: 1.376 u[IU]/mL (ref 0.350–4.500)

## 2023-07-26 LAB — GLUCOSE, CAPILLARY
Glucose-Capillary: 144 mg/dL — ABNORMAL HIGH (ref 70–99)
Glucose-Capillary: 155 mg/dL — ABNORMAL HIGH (ref 70–99)

## 2023-07-26 LAB — CBC
HCT: 37.1 % (ref 36.0–46.0)
Hemoglobin: 12.1 g/dL (ref 12.0–15.0)
MCH: 29.5 pg (ref 26.0–34.0)
MCHC: 32.6 g/dL (ref 30.0–36.0)
MCV: 90.5 fL (ref 80.0–100.0)
Platelets: 259 10*3/uL (ref 150–400)
RBC: 4.1 MIL/uL (ref 3.87–5.11)
RDW: 12.9 % (ref 11.5–15.5)
WBC: 10 10*3/uL (ref 4.0–10.5)
nRBC: 0 % (ref 0.0–0.2)

## 2023-07-26 LAB — BASIC METABOLIC PANEL WITH GFR
Anion gap: 12 (ref 5–15)
BUN: 26 mg/dL — ABNORMAL HIGH (ref 8–23)
CO2: 25 mmol/L (ref 22–32)
Calcium: 9.1 mg/dL (ref 8.9–10.3)
Chloride: 99 mmol/L (ref 98–111)
Creatinine, Ser: 1.58 mg/dL — ABNORMAL HIGH (ref 0.44–1.00)
GFR, Estimated: 32 mL/min — ABNORMAL LOW (ref >60)
Glucose, Bld: 373 mg/dL — ABNORMAL HIGH (ref 70–99)
Potassium: 4.2 mmol/L (ref 3.5–5.1)
Sodium: 136 mmol/L (ref 135–145)

## 2023-07-26 LAB — ECHOCARDIOGRAM COMPLETE
AR max vel: 2.29 cm2
AV Peak grad: 5 mmHg
Ao pk vel: 1.12 m/s
Area-P 1/2: 5.13 cm2
Height: 64 in
S' Lateral: 2.8 cm
Weight: 2522.06 [oz_av]

## 2023-07-26 LAB — HEMOGLOBIN A1C
Hgb A1c MFr Bld: 7.9 % — ABNORMAL HIGH (ref 4.8–5.6)
Mean Plasma Glucose: 180.03 mg/dL

## 2023-07-26 LAB — CBG MONITORING, ED
Glucose-Capillary: 276 mg/dL — ABNORMAL HIGH (ref 70–99)
Glucose-Capillary: 136 mg/dL — ABNORMAL HIGH (ref 70–99)

## 2023-07-26 MED ORDER — DOXYCYCLINE HYCLATE 100 MG PO TABS
100.0000 mg | ORAL_TABLET | Freq: Two times a day (BID) | ORAL | Status: DC
  Administered 2023-07-26 – 2023-07-27 (×3): 100 mg via ORAL
  Filled 2023-07-26 (×3): qty 1

## 2023-07-26 MED ORDER — METHYLPREDNISOLONE ACETATE 80 MG/ML IJ SUSP
80.0000 mg | Freq: Once | INTRAMUSCULAR | Status: DC
  Administered 2023-07-25: 80 mg via INTRAMUSCULAR

## 2023-07-26 NOTE — Progress Notes (Signed)
 PROGRESS NOTE  Caitlin Oliver ZOX:[id number] DOB: January 30, 1941 DOA: 07/25/2023 PCP: Richmond Campbell., PA-C   LOS: 0 days   Brief narrative:  Caitlin Oliver is a 83 y.o. female with medical history significant for COPD with chronic hypoxic respiratory failure on 4 L O2 via [place], CKD stage IIIb, T2DM, HTN, hyperlipidemia, history of CVA, carotid stenosis s/p b/l CEA, NSCLC s/p left upper lobectomy 2017 presented to hospital with shortness of breath for the last 3 weeks using her nebulizers at home.  Normally on Oxygen at home.  She does have a chronic cough which unchanged denied chest pain but has generalized weakness.  She was seen at the pulmonary office and due to increased work of breathing was seen into the hospital after giving IM Depo-Medrol.  In the ED, initial vitals were notable for pulse ox of 4 L of Nasal Cannula Oxygen.  Initial Labs Were within Normal Limits.  No Leukocytosis.  Influenza COVID and RSV Was Negative.  CTA of the Chest Was Negative for PE but small Pleural Effusion and Pulmonary Nodules.  Patient Was Given DuoNebs and Albuterol without significant improvement so was admitted hospital for further evaluation and treatment.     Assessment/Plan: Principal Problem:   COPD with acute exacerbation (HCC) Active Problems:   Chronic respiratory failure with hypoxia (HCC)   Hypertension associated with diabetes (HCC)   Type 2 diabetes mellitus (HCC)   Hyperlipidemia associated with type 2 diabetes mellitus (HCC)   Chronic pain   Chronic kidney disease, stage 3b (HCC)   History of CVA (cerebrovascular accident)   Pulmonary nodules   Multinodular thyroid   Acute exacerbation of chronic obstructive pulmonary disease (COPD) (HCC)  COPD with acute exacerbation with possible acute bronchitis:  on home 4 L O2 via [place].  Continue scheduled Brovana/Pulmicort, DuoNebs.  Continue IV Solu-Medrol twice daily.  BiPAP if needed for increased work of breathing.  Patient afebrile.  BNP slightly  elevated at 127.  Review of previous 2D echocardiogram from 2 year back showed LV ejection fraction of 65 to 70%.  Complains of productive yellow sputum.  Will add doxycycline empirically.  Chronic respiratory failure with hypoxia: Patient is on home oxygen at baseline.  CKD stage IIIb: Renal function stable.  Was slightly hypervolemic so torsemide on hold.   Type 2 diabetes with hyperglycemia.: Hemoglobin A1c at 7.9.  Continue to hold Tradjenta and continue on SSI.  Will continue to closely monitor while on steroids.   Hypertension: Continue bisoprolol.  Blood pressure seems to be on the higher side.   History of CVA: Continue Plavix and atorvastatin.   Hyperlipidemia: Continue atorvastatin.   Depression/anxiety: Continue Lexapro.   Chronic pain: Continue Norco.    Bilateral pulmonary nodules History of non-small cell lung cancer s/p left upper lobectomy 2017: Scattered 3 mm pulmonary nodules bilaterally noted on CTA chest.  Patient has prior history of lung cancer.  Recommend follow-up with pulmonology and noncontrast chest CT in 12 months should be considered.   Multinodular thyroid: Multinodular thyroid gland with coarse calcifications and dominant nodule measuring up to 3 cm incidentally noted on CT imaging.  Follow-up as outpatient.   DVT prophylaxis: heparin injection 5,000 Units Start: 07/25/23 2200   Disposition: Home likely in 1 to 2 days  Status is: Inpatient Remains inpatient appropriate because: Pending clinical improvement, IV steroids,    Code Status:     Code Status: Do not attempt resuscitation (DNR) - Comfort care  Family Communication: None at bedside  Consultants: None  Procedures: None  Anti-infectives:  Will add doxycycline orally  Anti-infectives (From admission, onward)    Start     Dose/Rate Route Frequency Ordered Stop   07/26/23 1100  doxycycline (VIBRA-TABS) tablet 100 mg        100 mg Oral Every 12 hours 07/26/23 1053           Subjective: Today, patient was seen and examined at bedside.  Patient states that she feels a little better with breathing.  Still has some cough with productive sputum.  Denies any fever chills or rigor.  No nausea vomiting.  Complains of headache and takes hydrocodone at home.  Objective: Vitals:   07/26/23 0930 07/26/23 1009  BP: (!) 150/95   Pulse: (!) 101   Resp: (!) 31   Temp:  (!) 97.5 F (36.4 C)  SpO2: 97%     Intake/Output Summary (Last 24 hours) at 07/26/2023 1054 Last data filed at 07/25/2023 2111 Gross per 24 hour  Intake 240 ml  Output --  Net 240 ml   Filed Weights   07/26/23 0158  Weight: 71.5 kg   Body mass index is 27.06 kg/m.   Physical Exam:  GENERAL: Patient is alert awake and oriented. Not in obvious distress.  Elderly female, on nasal cannula oxygen, Communicative. HENT: No scleral pallor or icterus. Pupils equally reactive to light. Oral mucosa is moist NECK: is supple, no gross swelling noted. CHEST: Decreased breath sounds bilaterally, coarse breath sounds noted.  Occasional expiratory wheezes. CVS: S1 and S2 heard, no murmur. Regular rate and rhythm.  ABDOMEN: Soft, non-tender, bowel sounds are present. EXTREMITIES: No edema. CNS: Cranial nerves are intact. No focal motor deficits. SKIN: warm and dry without rashes.  Data Review: I have personally reviewed the following laboratory data and studies,  CBC: Recent Labs  Lab 07/25/23 1339 07/26/23 0210  WBC 10.4 10.0  NEUTROABS 8.5*  --   HGB 12.3 12.1  HCT 37.0 37.1  MCV 90.2 90.5  PLT 264 259   Basic Metabolic Panel: Recent Labs  Lab 07/25/23 1339 07/26/23 0210  NA 135 136  K 3.9 4.2  CL 97* 99  CO2 25 25  GLUCOSE 107* 373*  BUN 26* 26*  CREATININE 1.36* 1.58*  CALCIUM 8.6* 9.1   Liver Function Tests: Recent Labs  Lab 07/25/23 1339  AST 23  ALT 18  ALKPHOS 78  BILITOT 0.6  PROT 6.4*  ALBUMIN 3.7   No results for input(s): "LIPASE", "AMYLASE" in the last 168  hours. No results for input(s): "AMMONIA" in the last 168 hours. Cardiac Enzymes: No results for input(s): "CKTOTAL", "CKMB", "CKMBINDEX", "TROPONINI" in the last 168 hours. BNP (last 3 results) Recent Labs    05/30/23 1538 06/30/23 1147 07/25/23 1339  BNP 100.0 94.3 127.3*    ProBNP (last 3 results) No results for input(s): "PROBNP" in the last 8760 hours.  CBG: Recent Labs  Lab 07/25/23 2148 07/26/23 0749  GLUCAP 233* 276*   Recent Results (from the past 240 hours)  Resp panel by RT-PCR (RSV, Flu A&B, Covid) Anterior Nasal Swab     Status: None   Collection Time: 07/25/23  1:39 PM   Specimen: Anterior Nasal Swab  Result Value Ref Range Status   SARS Coronavirus 2 by RT PCR NEGATIVE NEGATIVE Final   Influenza A by PCR NEGATIVE NEGATIVE Final   Influenza B by PCR NEGATIVE NEGATIVE Final    Comment: (NOTE) The Xpert Xpress SARS-CoV-2/FLU/RSV plus assay is intended as  an aid in the diagnosis of influenza from Nasopharyngeal swab specimens and should not be used as a sole basis for treatment. Nasal washings and aspirates are unacceptable for Xpert Xpress SARS-CoV-2/FLU/RSV testing.  Fact Sheet for Patients: BloggerCourse.com  Fact Sheet for Healthcare Providers: SeriousBroker.it  This test is not yet approved or cleared by the [place] FDA and has been authorized for detection and/or diagnosis of SARS-CoV-2 by FDA under an Emergency Use Authorization (EUA). This EUA will remain in effect (meaning this test can be used) for the duration of the COVID-19 declaration under Section 564(b)(1) of the Act, 21 U.S.C. section 360bbb-3(b)(1), unless the authorization is terminated or revoked.     Resp Syncytial Virus by PCR NEGATIVE NEGATIVE Final    Comment: (NOTE) Fact Sheet for Patients: BloggerCourse.com  Fact Sheet for Healthcare Providers: SeriousBroker.it  This  test is not yet approved or cleared by the [place] FDA and has been authorized for detection and/or diagnosis of SARS-CoV-2 by FDA under an Emergency Use Authorization (EUA). This EUA will remain in effect (meaning this test can be used) for the duration of the COVID-19 declaration under Section 564(b)(1) of the Act, 21 U.S.C. section 360bbb-3(b)(1), unless the authorization is terminated or revoked.  Performed at [facility], [address] [place], [place] [address]      Studies: CT Angio Chest PE W and/or Wo Contrast Result Date: 07/25/2023 CLINICAL DATA:  Pulmonary embolism suspected, high probability. Shortness of breath. EXAM: CT ANGIOGRAPHY CHEST WITH CONTRAST TECHNIQUE: Multidetector CT imaging of the chest was performed using the standard protocol during bolus administration of intravenous contrast. Multiplanar CT image reconstructions and MIPs were obtained to evaluate the vascular anatomy. RADIATION DOSE REDUCTION: This exam was performed according to the departmental dose-optimization program which includes automated exposure control, adjustment of the mA and/or kV according to patient size and/or use of iterative reconstruction technique. CONTRAST:  75mL OMNIPAQUE IOHEXOL 350 MG/ML SOLN COMPARISON:  06/18/2018. FINDINGS: Cardiovascular: Heart is normal in size and there is no pericardial effusion. Three-vessel coronary artery calcifications are noted. There is atherosclerotic calcification of the aorta without evidence of aneurysm. The pulmonary trunk is distended suggesting underlying pulmonary artery hypertension. No pulmonary embolism is seen. Mediastinum/Nodes: Prominent lymph node is present in the precarinal space measuring 1.1 cm. There is a prominent right hilar lymph node measuring 1.8 cm. No axillary lymphadenopathy. Thyroid gland is heterogeneous with coarse coarse with a dominant nodule in the left lobe measuring 2.7 cm. The trachea and esophagus are within  normal limits. Lungs/Pleura: There is a small pleural effusion on the left and trace pleural effusion on the right. Centrilobular emphysematous changes are present in the lungs. There is mild atelectasis bilaterally. No pneumothorax is seen. Scattered pulmonary nodules are present in the lungs bilaterally measuring up to 3 mm. Upper Abdomen: A small stone is present within the gallbladder. No acute abnormality. Musculoskeletal: No acute osseous abnormality is seen. Review of the MIP images confirms the above findings. IMPRESSION: 1. No evidence of pulmonary embolism. 2. Small pleural effusion on the left and trace pleural effusion on the right with atelectasis. 3. Scattered 3 mm pulmonary nodules bilaterally. No follow-up needed if patient is low-risk (and has no known or suspected primary neoplasm). Non-contrast chest CT can be considered in 12 months if patient is high-risk. This recommendation follows the consensus statement: Guidelines for Management of Incidental Pulmonary Nodules Detected on CT Images: From the Fleischner Society 2017; Radiology 2017; 284:228-243. 4. Multinodular thyroid gland  with coarse calcifications and dominant nodule measuring up to 3 cm. Nonemergent thyroid ultrasound is recommended. 5. Coronary artery calcifications and aortic atherosclerosis. Electronically Signed   By: Thornell Sartorius M.D.   On: 07/25/2023 20:15   DG Chest Portable 1 View Result Date: 07/25/2023 CLINICAL DATA:  Shortness of breath. EXAM: PORTABLE CHEST 1 VIEW COMPARISON:  Chest radiograph dated 06/30/2023. FINDINGS: The heart size and mediastinal contours are within normal limits. Aortic atherosclerosis. Central pulmonary vascular congestion. Bibasilar interstitial prominence. No focal consolidation, sizeable pleural effusion, or pneumothorax. Vascular stents are again noted about the superior mediastinum. No acute osseous abnormality. IMPRESSION: Central pulmonary vascular congestion with bibasilar interstitial  prominence, which could reflect edema or atypical infection. Electronically Signed   By: Hart Robinsons M.D.   On: 07/25/2023 15:20      Joycelyn Das, MD  [facility] 07/26/2023  If 7PM-7AM, please contact night-coverage

## 2023-07-26 NOTE — ED Notes (Signed)
 PT is having difficulty breathing at this time. Administered PT a breathing tx and PT has found some relief. PT has a PRN order for BIPAP at this time and has declined the BIPAP. PT states that the BIPAP suffocates her.

## 2023-07-26 NOTE — Progress Notes (Signed)
 Echocardiogram 2D Echocardiogram has been performed.  Lucendia Herrlich 07/26/2023, 5:57 PM

## 2023-07-26 NOTE — Addendum Note (Signed)
 Addended by: Serafina Royals on: 07/26/2023 08:50 AM   Modules accepted: Orders

## 2023-07-27 DIAGNOSIS — J441 Chronic obstructive pulmonary disease with (acute) exacerbation: Secondary | ICD-10-CM | POA: Diagnosis not present

## 2023-07-27 LAB — GLUCOSE, CAPILLARY: Glucose-Capillary: 247 mg/dL — ABNORMAL HIGH (ref 70–99)

## 2023-07-27 MED ORDER — PREDNISONE 10 MG PO TABS: ORAL_TABLET | ORAL | 0 refills | Status: AC

## 2023-07-27 NOTE — Progress Notes (Signed)
 Patient being discharged home. AVS discussed by discharge nurse. Patient left with belongings. Patient connected to her oxygen at the time of discharge.

## 2023-07-27 NOTE — Plan of Care (Signed)
  Problem: Coping: Goal: Ability to adjust to condition or change in health will improve Outcome: Progressing   Problem: Fluid Volume: Goal: Ability to maintain a balanced intake and output will improve Outcome: Progressing   Problem: Nutritional: Goal: Maintenance of adequate nutrition will improve Outcome: Progressing Goal: Progress toward achieving an optimal weight will improve Outcome: Progressing   Problem: Skin Integrity: Goal: Risk for impaired skin integrity will decrease Outcome: Progressing   Problem: Tissue Perfusion: Goal: Adequacy of tissue perfusion will improve Outcome: Progressing

## 2023-07-27 NOTE — Discharge Summary (Signed)
 Physician Discharge Summary   Patient: Caitlin Oliver MRN: [id number] DOB: 03/09/1941  Admit date:     07/25/2023  Discharge date: 07/27/23  Discharge Physician: Jodeane Mulligan   PCP: Lory Rough., PA-C   Recommendations at discharge:   At this time patient will be discharged home.  If you experience any symptoms such as fever, vomiting, shortness of breath, chest pain, abdominal pain, or other concerning symptoms, please call your primary care provider or go to the emergency department immediately.  Discharge Diagnoses: Principal Problem:   COPD with acute exacerbation (HCC) Active Problems:   Chronic respiratory failure with hypoxia (HCC)   Hypertension associated with diabetes (HCC)   Type 2 diabetes mellitus (HCC)   Hyperlipidemia associated with type 2 diabetes mellitus (HCC)   Chronic pain   Chronic kidney disease, stage 3b (HCC)   History of CVA (cerebrovascular accident)   Pulmonary nodules   Multinodular thyroid   Acute exacerbation of chronic obstructive pulmonary disease (COPD) (HCC)  Resolved Problems:   * No resolved hospital problems. *  Hospital Course: Caitlin Oliver is a 83 y.o. female with medical history significant for COPD with chronic hypoxic respiratory failure on 4 L O2 via [place], CKD stage IIIb, T2DM, HTN, HLD, history of CVA, CAS s/p b/l CEA, NSCLC s/p left upper lobectomy 2017 presented to hospital with shortness of breath for the last 3 weeks using her nebulizers at home.  Normally on.  Oxygen at home.  She does have a chronic cough which is unchanged denied chest pain but has generalized weakness.  She was seen at the pulmonary office and due to increased work of breathing was seen into the hospital after giving IM Depo-Medrol.  In the ED initial vitals were notable for pulse ox of 4 L of Nasal Cannula Oxygen.  Initial Labs Were within Normal Limits.  No Leukocytosis.  Influenza COVID and RSV Was Negative.  CTA of the Chest Was Negative for PE.  Small  Pleural Effusion and Pulmonary Nodules.  Patient Was Given DuoNebs and Albuterol without significant improvement so was admitted hospital for further evaluation and treatment.    Assessment and Plan: COPD with acute exacerbation:  on home 4 L O2 via [place].  Continue scheduled Brovana/Pulmicort, DuoNebs.  Responded well to IV Solu-Medrol twice daily.   Repeat echo noting LV ejection fraction of 65 to 70%.  Will transition patient to p.o. prednisone taper to take as directed.  Resume home inhaler/nebulizer regimen.  Patient takes doxycycline twice daily chronically.  Chronic respiratory failure with hypoxia: Appears to be at baseline.  CKD stage IIIb: Renal function stable.  Resume home regimen.   Type 2 diabetes with hyperglycemia.: Hemoglobin A1c at 7.9.  Resume home regimen upon discharge.  Glucose may be elevated while on prednisone therapy.   Hypertension: Resume home medication regimen   History of CVA: Continue Plavix and atorvastatin.   Hyperlipidemia: Continue atorvastatin.   Depression/anxiety: Continue Lexapro.   Chronic pain: Continue Norco.    Bilateral pulmonary nodules History of non-small cell lung cancer s/p left upper lobectomy 2017: Scattered 3 mm pulmonary nodules bilaterally noted on CTA chest.  Patient has prior history of lung cancer.  Recommend follow-up with pulmonology and noncontrast chest CT in 12 months should be considered.   Multinodular thyroid: Multinodular thyroid gland with coarse calcifications and dominant nodule measuring up to 3 cm incidentally noted on CT imaging.  Follow-up as outpatient.  Assessment and Plan: No notes have been filed under this  hospital service. Service: Hospitalist        Consultants: None Procedures performed: None Disposition: Home Diet recommendation:  Discharge Diet Orders (From admission, onward)     Start     Ordered   07/27/23 0000  Diet - low sodium heart healthy        07/27/23 1024            Cardiac and Carb modified diet DISCHARGE MEDICATION: Allergies as of 07/27/2023       Reactions   Fentanyl Shortness Of Breath        Medication List     TAKE these medications    acetaminophen 500 MG tablet Commonly known as: TYLENOL Take 500 mg by mouth every 6 (six) hours as needed for moderate pain.   albuterol (2.5 MG/3ML) 0.083% nebulizer solution Commonly known as: PROVENTIL Take 2.5 mg by nebulization in the morning and at bedtime.   albuterol 108 (90 Base) MCG/ACT inhaler Commonly known as: VENTOLIN HFA Inhale 2 puffs into the lungs every 6 (six) hours as needed for wheezing or shortness of breath.   atorvastatin 40 MG tablet Commonly known as: LIPITOR Take 1 tablet (40 mg total) by mouth daily.   bisoprolol 5 MG tablet Commonly known as: ZEBETA TAKE ONE-HALF TABLET BY MOUTH DAILY   Breztri Aerosphere 160-9-4.8 MCG/ACT Aero inhaler Generic drug: budeson-glycopyrrolate-formoterol Inhale 2 puffs into the lungs in the morning and at bedtime.   carboxymethylcellul-glycerin 0.5-0.9 % ophthalmic solution Commonly known as: REFRESH OPTIVE Place 1 drop into both eyes daily as needed for dry eyes.   clopidogrel 75 MG tablet Commonly known as: PLAVIX Take 1 tablet (75 mg total) by mouth daily.   docusate sodium 100 MG capsule Commonly known as: COLACE Take 100 mg by mouth 2 (two) times daily as needed for mild constipation.   doxycycline 100 MG capsule Commonly known as: VIBRAMYCIN Take 100 mg by mouth 2 (two) times daily.   escitalopram 10 MG tablet Commonly known as: LEXAPRO Take 1 tablet (10 mg total) by mouth daily.   ferrous sulfate 325 (65 FE) MG tablet Take 1 tablet (325 mg total) by mouth daily with breakfast.   Folic Acid-Vit B6-Vit B12 2.5-25-1 MG Tabs tablet Commonly known as: FOLBEE Take 1 tablet by mouth daily.   HYDROcodone-acetaminophen 5-325 MG tablet Commonly known as: NORCO/VICODIN Take 1 tablet by mouth every 8 (eight) hours as  needed for moderate pain (pain score 4-6) or severe pain (pain score 7-10).   Magnesium 250 MG Tabs Take 250 mg by mouth in the morning and at bedtime.   meclizine 25 MG tablet Commonly known as: ANTIVERT Take 1 tablet (25 mg total) by mouth every morning.   multivitamin with minerals Tabs tablet Take 1 tablet by mouth daily.   naphazoline-glycerin 0.012-0.25 % Soln Commonly known as: CLEAR EYES REDNESS 1-2 drops 4 (four) times daily as needed for eye irritation.   ondansetron 4 MG tablet Commonly known as: ZOFRAN Take 4 mg by mouth daily as needed for nausea.   OXYGEN Inhale 4 L/min into the lungs continuous.   pantoprazole 40 MG tablet Commonly known as: PROTONIX Take 1 tablet (40 mg total) by mouth 2 (two) times daily.   polyethylene glycol 17 g packet Commonly known as: MIRALAX / GLYCOLAX Take 17 g by mouth daily as needed for mild constipation.   potassium chloride SA 20 MEQ tablet Commonly known as: KLOR-CON M TAKE ONE TABLET BY MOUTH EVERY DAY   predniSONE 10 MG tablet  Commonly known as: DELTASONE Take 4 tablets (40 mg total) by mouth daily for 3 days, THEN 3 tablets (30 mg total) daily for 3 days, THEN 2 tablets (20 mg total) daily for 3 days, THEN 1 tablet (10 mg total) daily for 3 days. Start taking on: July 27, 2023   senna 8.6 MG Tabs tablet Commonly known as: SENOKOT Take 1 tablet (8.6 mg total) by mouth daily.   torsemide 20 MG tablet Commonly known as: DEMADEX Take 40 mg by mouth 2 (two) times daily.   Tradjenta 5 MG Tabs tablet Generic drug: linagliptin Take 1 tablet (5 mg total) by mouth daily.   vitamin C 1000 MG tablet Take 1,000 mg by mouth daily.   Vitamin D3 50 MCG (2000 UT) capsule Take 2,000 Units by mouth daily.        Discharge Exam: Filed Weights   07/26/23 0158  Weight: 71.5 kg   GENERAL:  Alert, pleasant, no acute distress  HEENT:  EOMI, nasal cannula CARDIOVASCULAR:  RRR, no murmurs appreciated RESPIRATORY:  Clear to  auscultation, no wheezing, rales, or rhonchi GASTROINTESTINAL:  Soft, nontender, nondistended EXTREMITIES:  No LE edema bilaterally NEURO:  No new focal deficits appreciated SKIN:  No rashes noted PSYCH:  Appropriate mood and affect    Condition at discharge: improving  The results of significant diagnostics from this hospitalization (including imaging, microbiology, ancillary and laboratory) are listed below for reference.   Imaging Studies: ECHOCARDIOGRAM COMPLETE Result Date: 07/26/2023    ECHOCARDIOGRAM REPORT   Patient Name:   REBEL LAUGHRIDGE Date of Exam: 07/26/2023 Medical Rec #:  [id number]      Height:       64.0 in Accession #:    [id number]     Weight:       157.6 lb Date of Birth:  June 21, 1940      BSA:          1.768 m Patient Age:    82 years       BP:           163/110 mmHg Patient Gender: F              HR:           99 bpm. Exam Location:  Inpatient Procedure: 2D Echo, Cardiac Doppler and Color Doppler (Both Spectral and Color            Flow Doppler were utilized during procedure). Indications:    Dyspnea R06.00  History:        Patient has prior history of Echocardiogram examinations, most                 recent 02/02/2022. CHF, PAD, Pulmonary HTN, COPD, CKD, stage 3                 and Stroke, Signs/Symptoms:Chest Pain and Shortness of Breath;                 Risk Factors:Hypertension, Diabetes, Dyslipidemia and Current                 Smoker.  Sonographer:    Terrilee Few RCS Referring Phys: [id number] [facility]  Sonographer Comments: Image acquisition challenging due to respiratory motion. IMPRESSIONS  1. Left ventricular ejection fraction, by estimation, is 65 to 70%. The left ventricle has normal function. The left ventricle has no regional wall motion abnormalities. Left ventricular diastolic parameters are consistent with Grade I diastolic dysfunction (impaired relaxation).  2. Right ventricular  systolic function is moderately reduced. The right ventricular size is mildly  enlarged. There is mildly elevated pulmonary artery systolic pressure. The estimated right ventricular systolic pressure is 41.2 mmHg.  3. The mitral valve is normal in structure. Trivial mitral valve regurgitation. No evidence of mitral stenosis.  4. The aortic valve is normal in structure. Aortic valve regurgitation is not visualized. No aortic stenosis is present.  5. Aortic dilatation noted. There is borderline dilatation of the aortic root, measuring 39 mm.  6. The inferior vena cava is normal in size with greater than 50% respiratory variability, suggesting right atrial pressure of 3 mmHg. FINDINGS  Left Ventricle: Left ventricular ejection fraction, by estimation, is 65 to 70%. The left ventricle has normal function. The left ventricle has no regional wall motion abnormalities. The left ventricular internal cavity size was normal in size. There is  no left ventricular hypertrophy. Left ventricular diastolic parameters are consistent with Grade I diastolic dysfunction (impaired relaxation). Right Ventricle: The right ventricular size is mildly enlarged. No increase in right ventricular wall thickness. Right ventricular systolic function is moderately reduced. There is mildly elevated pulmonary artery systolic pressure. The tricuspid regurgitant velocity is 3.01 m/s, and with an assumed right atrial pressure of 5 mmHg, the estimated right ventricular systolic pressure is 41.2 mmHg. Left Atrium: Left atrial size was normal in size. Right Atrium: Right atrial size was normal in size. Pericardium: There is no evidence of pericardial effusion. Mitral Valve: The mitral valve is normal in structure. Trivial mitral valve regurgitation. No evidence of mitral valve stenosis. Tricuspid Valve: The tricuspid valve is normal in structure. Tricuspid valve regurgitation is mild . No evidence of tricuspid stenosis. Aortic Valve: The aortic valve is normal in structure. Aortic valve regurgitation is not visualized. No aortic  stenosis is present. Aortic valve peak gradient measures 5.0 mmHg. Pulmonic Valve: The pulmonic valve was normal in structure. Pulmonic valve regurgitation is not visualized. No evidence of pulmonic stenosis. Aorta: Aortic dilatation noted. There is borderline dilatation of the aortic root, measuring 39 mm. Venous: The inferior vena cava is normal in size with greater than 50% respiratory variability, suggesting right atrial pressure of 3 mmHg. IAS/Shunts: No atrial level shunt detected by color flow Doppler.  LEFT VENTRICLE PLAX 2D LVIDd:         4.20 cm   Diastology LVIDs:         2.80 cm   LV e' medial:    9.03 cm/s LV PW:         0.80 cm   LV E/e' medial:  7.9 LV IVS:        0.90 cm   LV e' lateral:   8.21 cm/s LVOT diam:     2.00 cm   LV E/e' lateral: 8.7 LV SV:         53 LV SV Index:   30 LVOT Area:     3.14 cm  RIGHT VENTRICLE RV S prime:     14.50 cm/s TAPSE (M-mode): 2.1 cm LEFT ATRIUM           Index        RIGHT ATRIUM           Index LA diam:      3.80 cm 2.15 cm/m   RA Area:     14.00 cm LA Vol (A4C): 40.8 ml 23.08 ml/m  RA Volume:   35.10 ml  19.85 ml/m  AORTIC VALVE AV Area (Vmax): 2.29 cm AV Vmax:  112.00 cm/s AV Peak Grad:   5.0 mmHg LVOT Vmax:      81.50 cm/s LVOT Vmean:     51.150 cm/s LVOT VTI:       0.170 m  AORTA Ao Root diam: 3.90 cm Ao Asc diam:  3.30 cm MITRAL VALVE                TRICUSPID VALVE MV Area (PHT): 5.13 cm     TR Peak grad:   36.2 mmHg MV Decel Time: 148 msec     TR Vmax:        301.00 cm/s MV E velocity: 71.20 cm/s MV A velocity: 118.00 cm/s  SHUNTS MV E/A ratio:  0.60         Systemic VTI:  0.17 m                             Systemic Diam: 2.00 cm Jules Oar MD Electronically signed by Jules Oar MD Signature Date/Time: 07/26/2023/5:58:42 PM    Final    CT Angio Chest PE W and/or Wo Contrast Result Date: 07/25/2023 CLINICAL DATA:  Pulmonary embolism suspected, high probability. Shortness of breath. EXAM: CT ANGIOGRAPHY CHEST WITH CONTRAST TECHNIQUE:  Multidetector CT imaging of the chest was performed using the standard protocol during bolus administration of intravenous contrast. Multiplanar CT image reconstructions and MIPs were obtained to evaluate the vascular anatomy. RADIATION DOSE REDUCTION: This exam was performed according to the departmental dose-optimization program which includes automated exposure control, adjustment of the mA and/or kV according to patient size and/or use of iterative reconstruction technique. CONTRAST:  75mL OMNIPAQUE IOHEXOL 350 MG/ML SOLN COMPARISON:  06/18/2018. FINDINGS: Cardiovascular: Heart is normal in size and there is no pericardial effusion. Three-vessel coronary artery calcifications are noted. There is atherosclerotic calcification of the aorta without evidence of aneurysm. The pulmonary trunk is distended suggesting underlying pulmonary artery hypertension. No pulmonary embolism is seen. Mediastinum/Nodes: Prominent lymph node is present in the precarinal space measuring 1.1 cm. There is a prominent right hilar lymph node measuring 1.8 cm. No axillary lymphadenopathy. Thyroid gland is heterogeneous with coarse coarse with a dominant nodule in the left lobe measuring 2.7 cm. The trachea and esophagus are within normal limits. Lungs/Pleura: There is a small pleural effusion on the left and trace pleural effusion on the right. Centrilobular emphysematous changes are present in the lungs. There is mild atelectasis bilaterally. No pneumothorax is seen. Scattered pulmonary nodules are present in the lungs bilaterally measuring up to 3 mm. Upper Abdomen: A small stone is present within the gallbladder. No acute abnormality. Musculoskeletal: No acute osseous abnormality is seen. Review of the MIP images confirms the above findings. IMPRESSION: 1. No evidence of pulmonary embolism. 2. Small pleural effusion on the left and trace pleural effusion on the right with atelectasis. 3. Scattered 3 mm pulmonary nodules bilaterally. No  follow-up needed if patient is low-risk (and has no known or suspected primary neoplasm). Non-contrast chest CT can be considered in 12 months if patient is high-risk. This recommendation follows the consensus statement: Guidelines for Management of Incidental Pulmonary Nodules Detected on CT Images: From the Fleischner Society 2017; Radiology 2017; 284:228-243. 4. Multinodular thyroid gland with coarse calcifications and dominant nodule measuring up to 3 cm. Nonemergent thyroid ultrasound is recommended. 5. Coronary artery calcifications and aortic atherosclerosis. Electronically Signed   By: Wyvonnia Heimlich M.D.   On: 07/25/2023 20:15   DG Chest Portable 1 View Result  Date: 07/25/2023 CLINICAL DATA:  Shortness of breath. EXAM: PORTABLE CHEST 1 VIEW COMPARISON:  Chest radiograph dated 06/30/2023. FINDINGS: The heart size and mediastinal contours are within normal limits. Aortic atherosclerosis. Central pulmonary vascular congestion. Bibasilar interstitial prominence. No focal consolidation, sizeable pleural effusion, or pneumothorax. Vascular stents are again noted about the superior mediastinum. No acute osseous abnormality. IMPRESSION: Central pulmonary vascular congestion with bibasilar interstitial prominence, which could reflect edema or atypical infection. Electronically Signed   By: Mannie Seek M.D.   On: 07/25/2023 15:20   DG Chest 2 View Result Date: 06/30/2023 CLINICAL DATA:  Shortness of breath. EXAM: CHEST - 2 VIEW COMPARISON:  05/30/2023 FINDINGS: Low lung volume. Redemonstration of increased interstitial markings throughout bilateral lungs which are likely accentuated by low lung volume. No significant interval change. No frank pulmonary edema. Bilateral lung fields are otherwise clear. No dense consolidation or lung collapse. Bilateral costophrenic angles are clear. Stable cardio-mediastinal silhouette. No acute osseous abnormalities. The soft tissues are within normal limits. IMPRESSION:  *Redemonstration of increased interstitial markings throughout bilateral lungs. Differential diagnosis includes underlying chronic interstitial lung disease, atypical infection or pulmonary edema. Electronically Signed   By: Beula Brunswick M.D.   On: 06/30/2023 12:43    Microbiology: Results for orders placed or performed during the hospital encounter of 07/25/23  Resp panel by RT-PCR (RSV, Flu A&B, Covid) Anterior Nasal Swab     Status: None   Collection Time: 07/25/23  1:39 PM   Specimen: Anterior Nasal Swab  Result Value Ref Range Status   SARS Coronavirus 2 by RT PCR NEGATIVE NEGATIVE Final   Influenza A by PCR NEGATIVE NEGATIVE Final   Influenza B by PCR NEGATIVE NEGATIVE Final    Comment: (NOTE) The Xpert Xpress SARS-CoV-2/FLU/RSV plus assay is intended as an aid in the diagnosis of influenza from Nasopharyngeal swab specimens and should not be used as a sole basis for treatment. Nasal washings and aspirates are unacceptable for Xpert Xpress SARS-CoV-2/FLU/RSV testing.  Fact Sheet for Patients: BloggerCourse.com  Fact Sheet for Healthcare Providers: SeriousBroker.it  This test is not yet approved or cleared by the [place]  FDA and has been authorized for detection and/or diagnosis of SARS-CoV-2 by FDA under an Emergency Use Authorization (EUA). This EUA will remain in effect (meaning this test can be used) for the duration of the COVID-19 declaration under Section 564(b)(1) of the Act, 21 U.S.C. section 360bbb-3(b)(1), unless the authorization is terminated or revoked.     Resp Syncytial Virus by PCR NEGATIVE NEGATIVE Final    Comment: (NOTE) Fact Sheet for Patients: BloggerCourse.com  Fact Sheet for Healthcare Providers: SeriousBroker.it  This test is not yet approved or cleared by the [place]  FDA and has been authorized for detection and/or diagnosis of  SARS-CoV-2 by FDA under an Emergency Use Authorization (EUA). This EUA will remain in effect (meaning this test can be used) for the duration of the COVID-19 declaration under Section 564(b)(1) of the Act, 21 U.S.C. section 360bbb-3(b)(1), unless the authorization is terminated or revoked.  Performed at [facility], [address], [place], [place] [address]     Labs: CBC: Recent Labs  Lab 07/25/23 1339 07/26/23 0210  WBC 10.4 10.0  NEUTROABS 8.5*  --   HGB 12.3 12.1  HCT 37.0 37.1  MCV 90.2 90.5  PLT 264 259   Basic Metabolic Panel: Recent Labs  Lab 07/25/23 1339 07/26/23 0210  NA 135 136  K 3.9 4.2  CL 97* 99  CO2 25  25  GLUCOSE 107* 373*  BUN 26* 26*  CREATININE 1.36* 1.58*  CALCIUM 8.6* 9.1   Liver Function Tests: Recent Labs  Lab 07/25/23 1339  AST 23  ALT 18  ALKPHOS 78  BILITOT 0.6  PROT 6.4*  ALBUMIN 3.7   CBG: Recent Labs  Lab 07/26/23 0749 07/26/23 1219 07/26/23 1638 07/26/23 2124 07/27/23 0759  GLUCAP 276* 136* 144* 155* 247*    Discharge time spent:  19 minutes.  Signed: Jodeane Mulligan, DO [facility] 07/27/2023

## 2023-08-14 ENCOUNTER — Other Ambulatory Visit: Payer: Self-pay | Admitting: Internal Medicine

## 2023-08-21 ENCOUNTER — Encounter: Payer: Self-pay | Admitting: Internal Medicine

## 2023-08-21 ENCOUNTER — Ambulatory Visit: Payer: PPO | Admitting: Internal Medicine

## 2023-08-21 ENCOUNTER — Telehealth: Payer: Self-pay

## 2023-08-21 VITALS — BP 112/60 | HR 80 | Ht 64.0 in | Wt 154.0 lb

## 2023-08-21 DIAGNOSIS — Z7189 Other specified counseling: Secondary | ICD-10-CM

## 2023-08-21 DIAGNOSIS — J9611 Chronic respiratory failure with hypoxia: Secondary | ICD-10-CM

## 2023-08-21 DIAGNOSIS — J4489 Other specified chronic obstructive pulmonary disease: Secondary | ICD-10-CM

## 2023-08-21 DIAGNOSIS — J439 Emphysema, unspecified: Secondary | ICD-10-CM | POA: Diagnosis not present

## 2023-08-21 MED ORDER — PREDNISONE 10 MG PO TABS: 10.0000 mg | ORAL_TABLET | Freq: Every day | ORAL | 5 refills | Status: DC

## 2023-08-21 NOTE — Telephone Encounter (Signed)
 Received [place] new start paperwork. Completed form and faxed with clinicals and insurance card copy to [employer]   Phone#: [phone] Fax#: [phone]   Chesley Mires, PharmD, MPH, BCPS, CPP Clinical Pharmacist (Rheumatology and Pulmonology)

## 2023-08-21 NOTE — Progress Notes (Signed)
 Caitlin Oliver    [id number]    1940/05/19  Primary Care Physician:Kaplan, Lynett Sarah., PA-C Date of Appointment: 08/21/2023 Established Patient Visit  Chief complaint:   Chief Complaint  Patient presents with   Follow-up    Breathing had improved after last visit, but has slowly started worsening over the past 3-4 days. She has occ dry cough. She has not used her albuterol  inhaler but uses her albuterol  neb about 2 x per day.      HPI: Caitlin Oliver is a 83 y.o. woman with past medical history of COPD on home oxygen  4LNC.  Interval Updates: Here for follow up. She feels like she is worse with her breathing overall. Went to the ED after her last appointment last month and was ruled out for PE and pneumonia. Felt better with steroids.  She says she knows she is dying and approaching the end of her life.  She doesn't want to suffer   Current regimen: breztri  2 puffs twice daily.   Doing albuterol  nebs twice daily. Using breztri  2 puffs twice daily.   She wishes she could take albuterol  more frequently - she thinks she would take it ever 30 minutes. Unclear if albuterol  is helping.   She has hired help at home for her meals. Sleeps in her recliner due to difficulty laying flat  I have reviewed the patient's family social and past medical history and updated as appropriate.   Past Medical History:  Diagnosis Date   Anxiety    Arthritis    Cancer (HCC)    lung cancer   Carotid artery disease (HCC)    Cataracts, bilateral    CHF (congestive heart failure) (HCC)    Claudication (HCC)    COPD (chronic obstructive pulmonary disease) (HCC)    Diabetes mellitus    Dizzy    Family history of adverse reaction to anesthesia    Pt nephew has PONV   GERD (gastroesophageal reflux disease)    Headache    History of kidney stones    Hypercholesteremia    Hypertension    Peripheral arterial disease (HCC)    bilateral iliac artery stenosis by angiography   Pneumonia     Stomach ulcer    Stroke (HCC)    Tobacco abuse    Wears glasses     Past Surgical History:  Procedure Laterality Date   ABDOMINAL AORTIC ENDOVASCULAR STENT GRAFT Right 06/19/2019   Procedure: INNOMINATE STENT, RIGHT CAROTID STENT, RIGHT SUBCLAVIAN STENT AND CAROTID CUTDOWN;  Surgeon: Young Hensen, MD;  Location: [facility] OR;  Service: Vascular;  Laterality: Right;   ABDOMINAL HYSTERECTOMY     APPENDECTOMY     BREAST REDUCTION SURGERY     CAROTID ANGIOGRAM N/A 02/25/2013   Procedure: CAROTID ANGIOGRAM;  Surgeon: Avanell Leigh, MD;  Location: [facility] CATH LAB;  Service: Cardiovascular;  Laterality: N/A;   ENDARTERECTOMY Right 03/06/2013   Procedure: ENDARTERECTOMY CAROTID-RIGHT;  Surgeon: Margherita Shell, MD;  Location: [facility] OR;  Service: Vascular;  Laterality: Right;   ENDARTERECTOMY Left 05/07/2013   Procedure: LEFT CAROTID ARTERY ENDARTERECTOMY WITH VASCU-GUARD PATCH ANGIOPLASTY ;  Surgeon: Margherita Shell, MD;  Location: [facility] OR;  Service: Vascular;  Laterality: Left;   INTRAOPERATIVE ARTERIOGRAM N/A 06/19/2019   Procedure: Jenifer Miu;  Surgeon: Young Hensen, MD;  Location: [facility] OR;  Service: Vascular;  Laterality: N/A;   IR ANGIO INTRA EXTRACRAN SEL COM CAROTID INNOMINATE UNI R MOD SED  05/14/2019   IR ANGIO VERTEBRAL SEL SUBCLAVIAN INNOMINATE BILAT MOD SED  05/14/2019   LOBECTOMY Left 02/03/2016   Procedure: LEFT UPPER LOBECTOMY;  Surgeon: Zelphia Higashi, MD;  Location: [facility] OR;  Service: Thoracic;  Laterality: Left;   LOWER EXTREMITY ANGIOGRAM N/A 02/25/2013   Procedure: LOWER EXTREMITY ANGIOGRAM;  Surgeon: Avanell Leigh, MD;  Location: [facility] CATH LAB;  Service: Cardiovascular;  Laterality: N/A;   LOWER EXTREMITY ANGIOGRAM N/A 07/27/2013   Procedure: LOWER EXTREMITY ANGIOGRAM;  Surgeon: Avanell Leigh, MD;  Location: [facility] CATH LAB;  Service: Cardiovascular;  Laterality: N/A;   PATCH ANGIOPLASTY Right 03/06/2013   Procedure: PATCH ANGIOPLASTY of Right Carotid Artery using  Vascu-Guard Patch;  Surgeon: Margherita Shell, MD;  Location: [facility] OR;  Service: Vascular;  Laterality: Right;   PERIPHERAL VASCULAR CATHETERIZATION N/A 11/15/2015   Procedure: Aortic Arch Angiography;  Surgeon: Margherita Shell, MD;  Location: [facility] INVASIVE CV LAB;  Service: Cardiovascular;  Laterality: N/A;   PERIPHERAL VASCULAR CATHETERIZATION Bilateral 11/15/2015   Procedure: Carotid Angiography;  Surgeon: Margherita Shell, MD;  Location: [facility] INVASIVE CV LAB;  Service: Cardiovascular;  Laterality: Bilateral;   PERIPHERAL VASCULAR CATHETERIZATION Left 11/15/2015   Procedure: Upper Extremity Angiography;  Surgeon: Margherita Shell, MD;  Location: [facility] INVASIVE CV LAB;  Service: Cardiovascular;  Laterality: Left;   PERIPHERAL VASCULAR CATHETERIZATION Left 11/15/2015   Procedure: Peripheral Vascular Intervention;  Surgeon: Margherita Shell, MD;  Location: [facility] INVASIVE CV LAB;  Service: Cardiovascular;  Laterality: Left;  subclavian    RIGHT HEART CATH N/A 06/16/2019   Procedure: RIGHT HEART CATH;  Surgeon: Mardell Shade, MD;  Location: [facility] INVASIVE CV LAB;  Service: Cardiovascular;  Laterality: N/A;   RIGHT/LEFT HEART CATH AND CORONARY ANGIOGRAPHY N/A 11/20/2018   Procedure: RIGHT/LEFT HEART CATH AND CORONARY ANGIOGRAPHY;  Surgeon: Sammy Crisp, MD;  Location: [facility] INVASIVE CV LAB;  Service: Cardiovascular;  Laterality: N/A;   SALIVARY GLAND SURGERY     scar tissue removed from left saliva glad   TIBIA IM NAIL INSERTION Right 05/23/2021   Procedure: INTRAMEDULLARY (IM) NAIL TIBIAL;  Surgeon: Hardy Lia, MD;  Location: [facility] OR;  Service: Orthopedics;  Laterality: Right;   TUBAL LIGATION     ULTRASOUND GUIDANCE FOR VASCULAR ACCESS Right 06/19/2019   Procedure: Ultrasound Guidance For Vascular Access;  Surgeon: Young Hensen, MD;  Location: [facility] OR;  Service: Vascular;  Laterality: Right;   VIDEO ASSISTED THORACOSCOPY (VATS)/WEDGE RESECTION Left 02/03/2016   Procedure: VIDEO ASSISTED THORACOSCOPY;  Surgeon:  Zelphia Higashi, MD;  Location: [facility] OR;  Service: Thoracic;  Laterality: Left;    Family History  Problem Relation Age of Onset   Stroke Mother    Hypertension Mother    Kidney failure Father    Lung disease Neg Hx     Social History   Occupational History   Not on file  Tobacco Use   Smoking status: Former    Current packs/day: 0.00    Average packs/day: 1.5 packs/day for 56.0 years (84.0 ttl pk-yrs)    Types: Cigarettes    Start date: 07/07/1957    Quit date: 07/07/2013    Years since quitting: 10.1   Smokeless tobacco: Former    Quit date: 02/25/2013  Vaping Use   Vaping status: Some Days  Substance and Sexual Activity   Alcohol use: No   Drug use: No   Sexual activity: Never     Physical Exam: Blood pressure 112/60, pulse 80, height 5\' 4"  (1.626  m), weight 154 lb (69.9 kg), SpO2 97%.  Gen:   Elderly, frail, on nasal cannula, ambulates with cane Lungs: diminished, no wheezes or crackles CV:       RRR   Data Reviewed: Imaging: I have personally reviewed the chest xray apirl 2024 consistent with chronic copd.   PFTs:     Latest Ref Rng & Units 02/19/2019    3:21 PM 12/15/2015   10:48 AM  PFT Results  FVC-Pre L 2.03  2.49   FVC-Predicted Pre % 75  88   FVC-Post L  2.69   FVC-Predicted Post %  95   Pre FEV1/FVC % % 62  71   Post FEV1/FCV % %  72   FEV1-Pre L 1.25  1.77   FEV1-Predicted Pre % 62  84   FEV1-Post L  1.92   DLCO uncorrected ml/min/mmHg 9.75  16.88   DLCO UNC% % 51  69   DLVA Predicted % 63  80   TLC L 5.65  5.76   TLC % Predicted % 111  114   RV % Predicted % 158  144    I have personally reviewed the patient's PFTs and moderate airflow limitation with evidence of hyperinflation and air trapping.  Labs: Lab Results  Component Value Date   NA 136 07/26/2023   K 4.2 07/26/2023   CO2 25 07/26/2023   GLUCOSE 373 (H) 07/26/2023   BUN 26 (H) 07/26/2023   CREATININE 1.58 (H) 07/26/2023   CALCIUM  9.1 07/26/2023   GFR 59.78 (L)  11/13/2018   EGFR >60 03/12/2017   GFRNONAA 32 (L) 07/26/2023   Lab Results  Component Value Date   WBC 10.0 07/26/2023   HGB 12.1 07/26/2023   HCT 37.1 07/26/2023   MCV 90.5 07/26/2023   PLT 259 07/26/2023    Immunization status: Immunization History  Administered Date(s) Administered   Fluad Quad(high Dose 65+) 02/07/2022   Influenza, High Dose Seasonal PF 01/17/2016, 01/28/2017, 01/16/2018, 01/30/2019, 03/08/2020, 01/09/2021, 02/07/2022, 01/31/2023   Influenza-Unspecified 01/31/2016   Moderna Covid-19 Fall Seasonal Vaccine 11yrs & older 03/26/2022   Moderna Sars-Covid-2 Vaccination 01/31/2023   PFIZER Comirnaty(Gray Top)Covid-19 Tri-Sucrose Vaccine 10/12/2020   PFIZER(Purple Top)SARS-COV-2 Vaccination 10/08/2019, 10/28/2019   PNEUMOCOCCAL CONJUGATE-20 03/26/2022   Pfizer Covid-19 Vaccine Bivalent Booster 41yrs & up 03/30/2021   Pneumococcal Conjugate-13 02/09/2015   Pneumococcal Polysaccharide-23 05/08/2013   Rsv, Bivalent, Protein Subunit Rsvpref,pf Pattricia Bores) 10/23/2022   Tdap 05/05/2021   Zoster Recombinant(Shingrix) 10/21/2017, 10/23/2022   Zoster, Live 05/14/2011    External Records Personally Reviewed: pulmonary  Assessment:  Moderate COPD FEV1 62% of predicted (worsening PFTs from 2017 to 2020) with frequent exacerbations, not well controlled with acute exacerbation Chronic respiratory failure on 3LNC History of tobacco use disorder  Plan/Recommendations:  Laurenashley continues to have clinical decline related to her COPD and chronic respiratory failure.  We discussed trying to add O2 there and Dupixent to see if this would help her daily breathlessness with the O2 care and reduce her rate of exacerbations requiring prednisone  with the Dupixent.  She does have peripheral eosinophilia in the past with an absolute eosinophil count previously of 700.  Continue the Breztri  with as needed albuterol .  Outside the window for lung cancer screening based on age.   We talked  about goals of care and she has a MOST form and her niece is the POA.  She would be okay going to the hospital to get IV antibiotics, BiPAP, fluids and steroids but would not want  to be put in a ventilator or being admitted to the ICU.  She would also like to be a DNR.  She had multiple times says "this is how I die and this is going to be filled with suffering".  We discussed the role of palliative care and helping symptom management as well as an eventual transition to hospice when she is ready.  She does not want to suffer.  Will referral to palliative care.   She also really wants to be on prednisone  because she feels like it helps her breathing.  We discussed the risks and benefits of chronic local corticoid use and COPD.  At this point given our goals are for comfort rather than life-prolonging action we will start 10 mg prednisone  daily.   I spent 40 minutes in the care of this patient today including pre-charting, chart review, review of results, face-to-face care, coordination of care and communication with consultants etc.).   Return to Care: Return in about 3 months (around 11/21/2023).   Louie Rover, MD Pulmonary and Critical Care Medicine [facility] [phone]

## 2023-08-21 NOTE — Patient Instructions (Addendum)
 It was a pleasure to see you today!  Please schedule follow up with myself in 3 months.  If my schedule is not open yet, we will contact you with a reminder closer to that time. Please call [phone] if you haven't heard from us  a month before, and always call us  sooner if issues or concerns arise. You can also send us  a message through MyChart, but but aware that this is not to be used for urgent issues and it may take up to 5-7 days to receive a reply. Please be aware that you will likely be able to view your results before I have a chance to respond to them. Please give us  5 business days to respond to any non-urgent results.   Sorry to hear breathing is not doing well. For COPD -  Continue the Breztri  2 puffs twice a day, gargle after use Continue albuterol  inhaler as needed for shortness of breath chest tightness wheezing There are 2 medications we can add for shortness of breath and frequent exacerbations for COPD.  I will prescribe Ohtuvayre  which is a nebulized medication for COPD.  Once this is approved our pharmacy team will contact you.  The other medication is Dupixent which is an injectable medication that can help reduce your risk for exacerbations requiring prednisone .  You will do a visit in the office to learn about it once its approved and then continue injections at home.  In the meantime we talked about the risks and benefits of chronic steroid use for COPD.  At this point I feel that because your goals are to feel better on a daily basis and relieve your symptoms we should just continue prednisone  10 mg once daily. I am also referring you to palliative care which are the doctors that help with medications for symptom management so that you do not feel like you are suffering.  They can do home visits.  They can also help with transition to hospice when you are ready.  Continue home oxygen .  Goal is for oxygen  saturations over 88%.

## 2023-08-22 ENCOUNTER — Other Ambulatory Visit (HOSPITAL_COMMUNITY): Payer: Self-pay

## 2023-08-22 ENCOUNTER — Telehealth: Payer: Self-pay

## 2023-08-22 NOTE — Telephone Encounter (Signed)
 Received new start paperwork for Dupixent  Submitted a Prior Authorization request to [facility] [employer]ANT[employer]/RX ADVANCE for DUPIXENT via CoverMyMeds. Will update once we receive a response.  Key: [id number]

## 2023-08-22 NOTE — Telephone Encounter (Signed)
 Received notification from [employer] regarding a prior authorization for DUPIXENT. Authorization has been APPROVED from 08/22/2023 to 02/18/2024. Approval letter sent to scan center.  Per test claim, copay for 28 days supply is $0  Patient can fill through [facility]: [phone]   Authorization # [id number]  ATC patient to schedule Dupixent new start. Unable to reach as phone went straight to VM> left message with my callback number  Geraldene Kleine, PharmD, MPH, BCPS, CPP Clinical Pharmacist (Rheumatology and Pulmonology)

## 2023-08-26 ENCOUNTER — Telehealth: Payer: Self-pay | Admitting: Family Medicine

## 2023-08-26 ENCOUNTER — Ambulatory Visit: Payer: Self-pay | Admitting: Internal Medicine

## 2023-08-26 NOTE — Telephone Encounter (Signed)
 Routing to [employer].

## 2023-08-26 NOTE — Telephone Encounter (Signed)
 Triage call completed for pt. See nurse note for 5/12.

## 2023-08-26 NOTE — Telephone Encounter (Signed)
 I do not recommend prednisone  10 mg higher than what I prescribed. Can someone please follow up on the palliative care referral for symptom management? We need to expedite this to consider hospice at this point.

## 2023-08-26 NOTE — Telephone Encounter (Signed)
 Copied from CRM [id number]. Topic: Clinical - Pink Word Triage >> Aug 26, 2023  9:35 AM Juliana Ocean wrote: Reason for Triage: pt states the dr gave her a Rx for prednisone . (predniSONE  (DELTASONE ) 10 MG tablet) Pt wants the dr to know she was not getting any relief by taking one tablet, so over the weekend she increased this and is taking 2 tablets instead. Pt states this is helping a little more now.     TRIAGE SUMMARY NOTE: Pt reporting that she did not feel relief from prednisone  as prescribed, pt on her own increased dose to 2 tab daily and this has helped a little. Nurse speaking with pt over phone, pt is speaking in phrases, reporting she is more SOB than normal, "rode electric cart" at grocery store and "still out of breath," confirms dizzy "all the time" like she confirms is her usual, denies chest pain or other symptoms. Advised pt use her rescue inhaler. Pt and loved one attempting to get pt into house from car, pt speaking in single words, grunting with breaths, pt and loved one put nurse on hold. Pt returned to phone, speaking in phrases again, reporting she just used rescue inhaler which has helped a bit. Pt reporting that she is using her rescue inhaler more than normal at 2-4x/day, using albuterol  nebulizer 2x/day. Pt requesting updates on getting her new start dates and scripts filled for dupixent and ohtuvayre . Per pt chart, pharmacist attempted to contact pt on 5/8, pt requesting call back from pharmacist to discuss meds. Advised pt be examined for symptoms, pt refusing at this time, requesting further recommendations from pulm. Please advise.  E2C2 Pulmonary Triage - Initial Assessment Questions "Chief Complaint (e.g., cough, sob, wheezing, fever, chills, sweat or additional symptoms) *Go to specific symptom protocol after initial questions. More SOB than usual at this time, sitting here at grocery, rode electric cart and still out of breath No chest pain Dizzy all the time, but that's her  usual No weakness more than normal When talk or move or in bed asleep laying there I'm fine Speaking in phrases Speaking in single words to phrases with exertion trying to get into house after grocery store Grunting with respirations Wheezing? Can't tell Heart racing? No don't even know it's there Also needing her refill for dupixent and ohtuvayre  Informed pt that dupixent prior auth was approved Need update on Ohtuvayre  Just took inhaler and seems to help a little bit Can't tell if nebulizer helps  "How long have symptoms been present?" A week after normal prednisone  course Recent script on 5/7 sent to pharmacy but pt taking med differently  "Have you used your inhalers/maintenance medication?" Yes If yes, "What medications?" Rescue inhaler, breztri , albuterol  nebulizer 2x/day  If inhaler, ask "How many puffs and how often?" Note: Review instructions on medication in the chart. 2-4x/day  OXYGEN : "Do you wear supplemental oxygen ?" Yes If yes, "How many liters are you supposed to use?" 24/7 4L  "Do you monitor your oxygen  levels?" Yes If yes, "What is your reading (oxygen  level) today?" 96-97%  "What is your usual oxygen  saturation reading?"  (Note: Pulmonary O2 sats should be 90% or greater) 94-97% 88% when first get up  Reason for Disposition  [1] Longstanding difficulty breathing (e.g., CHF, COPD, emphysema) AND [2] WORSE than normal  Answer Assessment - Initial Assessment Questions 16. CARDIAC HISTORY: "Do you have any history of heart disease?" (e.g., heart attack, angina, bypass surgery, angioplasty)      significant 7. LUNG  HISTORY: "Do you have any history of lung disease?"  (e.g., pulmonary embolus, asthma, emphysema)     significant  Protocols used: Breathing Difficulty-A-AH

## 2023-08-26 NOTE — Telephone Encounter (Signed)
 Routing to [facility] for palliative care referral.

## 2023-08-26 NOTE — Telephone Encounter (Signed)
 Please advise Dr. Dione Franks

## 2023-08-26 NOTE — Telephone Encounter (Signed)
 Patient scheduled for Dupixent new start on 08/28/23. Will use sample

## 2023-08-26 NOTE — Telephone Encounter (Signed)
 Copied from CRM [id number]. Topic: Clinical - Medication Refill >> Aug 26, 2023  9:12 AM Juliana Ocean wrote: Medication:  Ohtuvayre //  DUPIXENT.  Has the patient contacted their pharmacy? Yes  Pt states these 2 new scripts that were approved for her and were not called in to her pharmacy.  This is the patient's preferred pharmacy:  [facility] [place], [place] - [address] [place] [address] Phone: [phone] Fax: [phone]   Is this the correct pharmacy for this prescription? Unsure.  If pt must use [facility], she is unaware.  Has the prescription been filled recently? No  Is the patient out of the medication? Yes  Has the patient been seen for an appointment in the last year OR does the patient have an upcoming appointment? Yes  Can we respond through MyChart? No

## 2023-08-26 NOTE — Telephone Encounter (Signed)
 Copied from CRM [id number]. Topic: Clinical - Pink Word Triage >> Aug 26, 2023  9:35 AM Caitlin Oliver wrote: Reason for Triage: pt states the dr gave her a Rx for prednisone . (predniSONE  (DELTASONE ) 10 MG tablet) Pt wants the dr to know she was not getting any relief by taking one tablet, so over the weekend she increased this and is taking 2 tablets instead. Pt states this is helping a little more now.

## 2023-08-27 NOTE — Telephone Encounter (Signed)
 Received fax from [employer] with summary of benefits. Referral form for Ohtuvayre  received. Rx will be triaged to [facility].. Once benefits investigation completed, pharmacy will reach out the patient to schedule shipment. If medication is unaffordable, patient will need to express financial hardship to be referred back to [employer] for patient assistance program pre-screening.   Patient ID: [id number] Pharmacy phone: [phone] [place] [facility] Phone#: [phone]

## 2023-08-27 NOTE — Telephone Encounter (Signed)
 Patient is in palliative care. Nothing further needed on [facility] side

## 2023-08-27 NOTE — Telephone Encounter (Signed)
 Copied from CRM [id number]. Topic: Referral - Question >> Aug 27, 2023 12:33 PM Chantha C wrote: Reason for CRM: Loyce Ruffini from [facility] palliative [phone] option 2 received a call from [place] patient referral may need to be expediated. Loyce Ruffini states they had a 08/23/23 virtual visit with patient and will send it to the family, because patient may be going to hospice; so she'll send the information to the team.

## 2023-08-27 NOTE — Telephone Encounter (Signed)
 Received fax from [place] that address was missing on application. Referral form addended and refaxed to [place] [employer] for enrollment

## 2023-08-28 ENCOUNTER — Other Ambulatory Visit: Admitting: Pharmacist

## 2023-08-28 NOTE — Telephone Encounter (Signed)
 Submitted a Prior Authorization request to [facility] ADVANTAGE/RX ADVANCE for OHTUVAYRE  via CoverMyMeds. Will update once we receive a response.  Key: [id number]

## 2023-08-29 NOTE — Progress Notes (Addendum)
 HPI Patient presents today to [facility] to see pharmacy team for Dupixent new start.  Past medical history includes HTN, T2DM, HLD, COPD, PAD, CVA, and CKD (stage 3). She is on home oxygen  4L [place].   Patient states she is not interested in [facility] due to the high cost of this medication.   Number of hospitalizations in past year: 3 Number of 3 COPD exacerbations this year  Respiratory Medications Current regimen:  -Breztri  2 puffs BID  -Recently approved for Ohtuvarye (not yet started)  Patient reports no known adherence challenges  OBJECTIVE Allergies  Allergen Reactions   Fentanyl  Shortness Of Breath    Outpatient Encounter Medications as of 09/05/2023  Medication Sig Note   acetaminophen  (TYLENOL ) 500 MG tablet Take 500 mg by mouth every 6 (six) hours as needed for moderate pain.    albuterol  (PROVENTIL  HFA;VENTOLIN  HFA) 108 (90 Base) MCG/ACT inhaler Inhale 2 puffs into the lungs every 6 (six) hours as needed for wheezing or shortness of breath.     albuterol  (PROVENTIL ) (2.5 MG/3ML) 0.083% nebulizer solution Take 2.5 mg by nebulization in the morning and at bedtime.    Ascorbic Acid  (VITAMIN C) 1000 MG tablet Take 1,000 mg by mouth daily.    atorvastatin  (LIPITOR) 40 MG tablet Take 1 tablet (40 mg total) by mouth daily.    bisoprolol  (ZEBETA ) 5 MG tablet TAKE ONE-HALF TABLET BY MOUTH DAILY    BREZTRI  AEROSPHERE 160-9-4.8 MCG/ACT AERO inhaler Inhale 2 puffs into the lungs in the morning and at bedtime.    carboxymethylcellul-glycerin  (REFRESH OPTIVE) 0.5-0.9 % ophthalmic solution Place 1 drop into both eyes daily as needed for dry eyes.    Cholecalciferol  (VITAMIN D3) 50 MCG (2000 UT) capsule Take 2,000 Units by mouth daily.    clopidogrel  (PLAVIX ) 75 MG tablet Take 1 tablet (75 mg total) by mouth daily.    docusate sodium  (COLACE) 100 MG capsule Take 100 mg by mouth 2 (two) times daily as needed for mild constipation.    doxycycline  (VIBRAMYCIN ) 100 MG capsule Take  100 mg by mouth 2 (two) times daily. 07/25/2023: Chronic due to scalp fungus per patient   escitalopram  (LEXAPRO ) 10 MG tablet Take 1 tablet (10 mg total) by mouth daily.    ferrous sulfate  325 (65 FE) MG tablet Take 1 tablet (325 mg total) by mouth daily with breakfast.    Folic Acid-Vit B6-Vit B12 (FOLBEE) 2.5-25-1 MG TABS tablet Take 1 tablet by mouth daily.    HYDROcodone -acetaminophen  (NORCO/VICODIN) 5-325 MG tablet Take 1 tablet by mouth every 8 (eight) hours as needed for moderate pain (pain score 4-6) or severe pain (pain score 7-10).    linagliptin  (TRADJENTA ) 5 MG TABS tablet Take 1 tablet (5 mg total) by mouth daily.    Magnesium  250 MG TABS Take 250 mg by mouth in the morning and at bedtime.    meclizine  (ANTIVERT ) 25 MG tablet Take 1 tablet (25 mg total) by mouth every morning.    Multiple Vitamin (MULTIVITAMIN WITH MINERALS) TABS tablet Take 1 tablet by mouth daily.    naphazoline-glycerin  (CLEAR EYES REDNESS) 0.012-0.25 % SOLN 1-2 drops 4 (four) times daily as needed for eye irritation.    ondansetron  (ZOFRAN ) 4 MG tablet Take 4 mg by mouth daily as needed for nausea.    OXYGEN  Inhale 4 L/min into the lungs continuous.    pantoprazole  (PROTONIX ) 40 MG tablet Take 1 tablet (40 mg total) by mouth 2 (two) times daily.    polyethylene glycol (MIRALAX  /  GLYCOLAX ) 17 g packet Take 17 g by mouth daily as needed for mild constipation.    potassium chloride  SA (KLOR-CON  M) 20 MEQ tablet TAKE ONE TABLET BY MOUTH EVERY DAY    predniSONE  (DELTASONE ) 10 MG tablet Take 1 tablet (10 mg total) by mouth daily with breakfast.    senna (SENOKOT) 8.6 MG TABS tablet Take 1 tablet (8.6 mg total) by mouth daily.    torsemide  (DEMADEX ) 20 MG tablet Take 40 mg by mouth 2 (two) times daily.    No facility-administered encounter medications on file as of 09/05/2023.     Immunization History  Administered Date(s) Administered   Fluad Quad(high Dose 65+) 02/07/2022   Influenza, High Dose Seasonal PF  01/17/2016, 01/28/2017, 01/16/2018, 01/30/2019, 03/08/2020, 01/09/2021, 02/07/2022, 01/31/2023   Influenza-Unspecified 01/31/2016   Moderna Covid-19 Fall Seasonal Vaccine 22yrs & older 03/26/2022   Moderna Sars-Covid-2 Vaccination 01/31/2023   PFIZER Comirnaty(Gray Top)Covid-19 Tri-Sucrose Vaccine 10/12/2020   PFIZER(Purple Top)SARS-COV-2 Vaccination 10/08/2019, 10/28/2019   PNEUMOCOCCAL CONJUGATE-20 03/26/2022   Pfizer Covid-19 Vaccine Bivalent Booster 1yrs & up 03/30/2021   Pneumococcal Conjugate-13 02/09/2015   Pneumococcal Polysaccharide-23 05/08/2013   Rsv, Bivalent, Protein Subunit Rsvpref,pf Pattricia Bores) 10/23/2022   Tdap 05/05/2021   Zoster Recombinant(Shingrix) 10/21/2017, 10/23/2022   Zoster, Live 05/14/2011     PFTs    Latest Ref Rng & Units 02/19/2019    3:21 PM 12/15/2015   10:48 AM  PFT Results  FVC-Pre L 2.03  2.49   FVC-Predicted Pre % 75  88   FVC-Post L  2.69   FVC-Predicted Post %  95   Pre FEV1/FVC % % 62  71   Post FEV1/FCV % %  72   FEV1-Pre L 1.25  1.77   FEV1-Predicted Pre % 62  84   FEV1-Post L  1.92   DLCO uncorrected ml/min/mmHg 9.75  16.88   DLCO UNC% % 51  69   DLVA Predicted % 63  80   TLC L 5.65  5.76   TLC % Predicted % 111  114   RV % Predicted % 158  144      Eosinophils Most recent blood eosinophil count was 100 cells/microL taken on 07/25/2023.   IgE: Never taken   Assessment   Biologics training for dupilumab (Dupixent)  Goals of therapy: Mechanism: human monoclonal IgG4 antibody that inhibits interleukin-4 and interleukin-13 cytokine-induced responses, including release of proinflammatory cytokines, chemokines, and IgE Reviewed that Dupixent is add-on medication and patient must continue maintenance inhaler regimen. Response to therapy: may take 4 months to determine efficacy. Discussed that patients generally feel improvement sooner than 4 months.  Side effects: injection site reaction (6-18%), antibody development (5-16%),  ophthalmic conjunctivitis (2-16%), transient blood eosinophilia (1-2%)  Dose: 300mg  every 14 days (for COPD)  Administration/Storage:  Reviewed administration sites of thigh or abdomen (at least 2-3 inches away from abdomen). Reviewed the upper arm is only appropriate if caregiver is administering injection  Do not shake pen/syringe as this could lead to product foaming or precipitation. Do not use if solution is discolored or contains particulate matter or if window on prefilled pen is yellow (indicates pen has been used).  Reviewed storage of medication in refrigerator. Reviewed that Dupixent can be stored at room temperature in unopened carton for up to 14 days.  Access: Approval of Dupixent through: insurance   Patient self-administered Dupixent 300mg /35ml x 1 (total dose 300mg ) in right lower abdomen using sample Dupixent 300mg /75mL autoinjector pen NDC: [phone] Lot: [id number] Expiration: 04/15/2025  Patient monitored for 30 minutes for adverse reaction.  Patient tolerated Dupixent.  Injection site noted. Patient denies itchiness and irritation at injection., No swelling or redness noted., and Reviewed injection site reaction management with patient verbally and printed information for review in AVS  Medication Reconciliation  A drug regimen assessment was performed, including review of allergies, interactions, disease-state management, dosing and immunization history. Medications were reviewed with the patient, including name, instructions, indication, goals of therapy, potential side effects, importance of adherence, and safe use.  Drug interaction(s): No significant DDIs identified   PLAN Continue Dupixent 300 mg every 14 days.  Next dose is due 09/19/2023 and every 14 days thereafter. Rx sent to: [facility]: [phone] .  Patient provided with pharmacy phone number and advised to call later this week to schedule shipment to home. Patient provided with  copay card information to provide to pharmacy if quoted copay exceeds $5 per month. Continue maintenance inhaler regimen of: Breztri  and Ohtuvayre   All questions encouraged and answered.  Instructed patient to reach out with any further questions or concerns.  Thank you for allowing pharmacy to participate in this patient's care.  Tolu Calvina Liptak, PharmD [facility] PGY-1

## 2023-08-29 NOTE — Telephone Encounter (Signed)
 Received notification from [employer] regarding a prior authorization for OHTUVAYRE . Authorization has been APPROVED from 08/28/2023 to 04/15/2024.   Authorization # [id number]  Geraldene Kleine, PharmD, MPH, BCPS, CPP Clinical Pharmacist (Rheumatology and Pulmonology)

## 2023-08-29 NOTE — Patient Instructions (Signed)
 Your next Dupixent dose is due on 09/19/2023, 10/03/2023, and every 14 days thereafter  CONTINUE Breztri  and Ohtuvarye  Your prescription will be shipped from [facility]. Their phone number is [phone] Please call to schedule shipment and confirm address. They will mail your medication to your home.  You will need to be seen by your provider in 3 to 4 months to assess how Dupixent is working for you. Please ensure you have a follow-up appointment scheduled. Call our clinic if you need to make this appointment.  Stay up to date on all routine vaccines: influenza, pneumonia, COVID19, Shingles  How to manage an injection site reaction: Remember the 5 C's: COUNTER - leave on the counter at least 30 minutes but up to overnight to bring medication to room temperature. This may help prevent stinging COLD - place something cold (like an ice gel pack or cold water bottle) on the injection site just before cleansing with alcohol. This may help reduce pain CLARITIN - use Claritin (generic name is loratadine) for the first two weeks of treatment or the day of, the day before, and the day after injecting. This will help to minimize injection site reactions CORTISONE CREAM - apply if injection site is irritated and itching CALL ME - if injection site reaction is bigger than the size of your fist, looks infected, blisters, or if you develop hives

## 2023-08-30 ENCOUNTER — Telehealth: Payer: Self-pay | Admitting: *Deleted

## 2023-08-30 NOTE — Telephone Encounter (Signed)
 Copied from CRM [id number]. Topic: General - Call Back - No Documentation >> Aug 27, 2023  4:27 PM Hilton Lucky wrote: Reason for CRM: Patient is returning call with no clear documentation regarding what it may have been about. Please return call to patient if needed.  See note from 08/26/23.  Nothing further needed.

## 2023-09-04 NOTE — Telephone Encounter (Signed)
 Seems like she was accepted into palliative care on 09/02/23 from the referral. So a new referral isn't needed.

## 2023-09-04 NOTE — Telephone Encounter (Signed)
 FYI Dr Desai. NFN

## 2023-09-04 NOTE — Telephone Encounter (Signed)
 Does a new referral need to be placed or just wait to hear back from [place]?

## 2023-09-05 ENCOUNTER — Encounter: Payer: Self-pay | Admitting: Cardiovascular Disease

## 2023-09-05 ENCOUNTER — Ambulatory Visit (INDEPENDENT_AMBULATORY_CARE_PROVIDER_SITE_OTHER): Admitting: Pharmacist

## 2023-09-05 DIAGNOSIS — J4489 Other specified chronic obstructive pulmonary disease: Secondary | ICD-10-CM

## 2023-09-05 DIAGNOSIS — J439 Emphysema, unspecified: Secondary | ICD-10-CM

## 2023-09-05 MED ORDER — DUPIXENT 300 MG/2ML ~~LOC~~ SOAJ
300.0000 mg | SUBCUTANEOUS | 3 refills | Status: AC
  Filled 2023-09-10: qty 4, 28d supply, fill #0
  Filled 2023-10-03: qty 4, 28d supply, fill #1
  Filled 2023-11-06: qty 4, 28d supply, fill #2
  Filled 2023-11-28: qty 4, 28d supply, fill #3

## 2023-09-06 ENCOUNTER — Other Ambulatory Visit: Payer: Self-pay

## 2023-09-10 ENCOUNTER — Other Ambulatory Visit: Payer: Self-pay

## 2023-09-10 ENCOUNTER — Other Ambulatory Visit: Payer: Self-pay | Admitting: Internal Medicine

## 2023-09-10 ENCOUNTER — Other Ambulatory Visit (HOSPITAL_COMMUNITY): Payer: Self-pay

## 2023-09-10 ENCOUNTER — Telehealth: Payer: Self-pay | Admitting: *Deleted

## 2023-09-10 NOTE — Telephone Encounter (Signed)
 Clarified with Dr. Dione Franks that she is not comfortable with patient taking more than 10 mg of prednisone  daily.  ATC patient x1.  LVM to return call.  Mychart message sent  to patient.

## 2023-09-10 NOTE — Telephone Encounter (Signed)
 [employer] and verified that patient took 20 mg of prednisone  for 7 days and she is currently out of her prednisone  and it is too early to refill.  She filled the prescription on 5/7.  Dr. Dione Franks, please advise.  Thank you.

## 2023-09-10 NOTE — Progress Notes (Signed)
 Specialty Pharmacy Initial Fill Coordination Note  Caitlin Oliver is a 83 y.o. female contacted today regarding initial fill of specialty medication(s) Dupilumab (Dupixent)   Patient requested Delivery   Delivery date: 09/12/23   Verified address: [address]   [place] [address]   Medication will be filled on 09/11/23.   Patient is aware of $0 copayment.   *NOTE* Add delivery instructions "Place package on back porch"

## 2023-09-10 NOTE — Telephone Encounter (Signed)
 Copied from CRM [id number]. Topic: Clinical - Medication Refill >> Sep 10, 2023  9:49 AM Ilene Malling wrote: Most Recent Pulmonary Care Visit:   Provider: Dr. Louie Rover  Department: [facility] Date: 08/21/23  Medication: predniSONE  (DELTASONE ) 10 MG tablet   Has the patient contacted their pharmacy? Yes, pharmacy states the office denied medication. Patient states she spoke with Dr. Dione Franks about continuing this medication.  (Agent: If no, request that the patient contact the pharmacy for the refill. If patient does not wish to contact the pharmacy document the reason why and proceed with request.) (Agent: If yes, when and what did the pharmacy advise?)  This is the patient's preferred pharmacy:  [facility] [place], [place] - [address] [place] [address] Phone: [phone] Fax: [phone]  Is this the correct pharmacy for this prescription? Yes If no, delete pharmacy and type the correct one.   Has the prescription been filled recently? No  Is the patient out of the medication? No, patient has one pill left.  Has the patient been seen for an appointment in the last year OR does the patient have an upcoming appointment? Yes  Can we respond through MyChart? No. Please call [phone]  Agent: Please be advised that Rx refills may take up to 3 business days. We ask that you follow-up with your pharmacy.

## 2023-09-11 ENCOUNTER — Other Ambulatory Visit: Payer: Self-pay

## 2023-09-13 ENCOUNTER — Telehealth: Payer: Self-pay | Admitting: Internal Medicine

## 2023-09-13 ENCOUNTER — Other Ambulatory Visit: Payer: Self-pay | Admitting: Internal Medicine

## 2023-09-13 ENCOUNTER — Ambulatory Visit: Payer: Self-pay

## 2023-09-13 MED ORDER — PREDNISONE 10 MG PO TABS: 10.0000 mg | ORAL_TABLET | Freq: Every day | ORAL | 5 refills | Status: AC

## 2023-09-13 MED ORDER — PREDNISONE 20 MG PO TABS: 20.0000 mg | ORAL_TABLET | Freq: Every day | ORAL | 0 refills | Status: AC

## 2023-09-13 NOTE — Telephone Encounter (Signed)
 This has been addressed in another encounter.  Message has been sent to the app of the day Caitlin Oliver.  Closing the duplicate encounter.

## 2023-09-13 NOTE — Telephone Encounter (Signed)
 Triage Nurse Tsf this call to me. Please see last tel encounter where Caitlin Oliver said we would not RX more Pred until early June. Triage was concerned because she is SOB and won't go to the Er. Please call PT/ She said if RX is not sent in she will have to wait the weekend. TY.

## 2023-09-13 NOTE — Telephone Encounter (Signed)
 I called and spoke with patient, she was upset that she had already spoken to 2 people and wanted to speak to the nurse regarding getting her prednisone  refilled.  She has been sob worse than normal yesterday and today.  She says she has been out since Wednesday.  I advised her that Dr. Dione Franks had discussed with her that she was not comfortable with her taking more than 10 mg of Prednisone  daily.  She stated that she was aware of that and started yelling in my ear about that and that she is sob every day.  I clarified that how long had she been more SOB than what is normal for her.  She said that I did not have long to send the message and get back to her before the end of the day.  I advised her that Dr. Dione Franks wanted me to speak with her and let her know and then she would decide regarding the Prednisone .  She said she will go back to only taking 10 mg daily, but she has to have the prednisone , it helps her breathing and she cannot go without it through the weekend.  Advised I would get a message to doctor of the day and we would call her back before the end of the day.    Caitlin Oliver,  Dr. Dione Franks wanted to hear back from the patient prior to refilling her Prednisone .  She advised her at the last OV that she was not comfortable with her taking more than 10 mg of prednisone  daily.  She took 20 mg for 7 days and ran out of her prednisone  and was not able to have it refilled d/t taking more than was prescribed. Please advise regarding refilling her prednisone .

## 2023-09-13 NOTE — Telephone Encounter (Signed)
 Prednisone  20mg  daily for 5 days and then 10mg  daily. Rx sent to pharm .  Please contact office for sooner follow up if symptoms do not improve or worsen or seek emergency care  Patient aware

## 2023-09-13 NOTE — Telephone Encounter (Signed)
  Chief Complaint: Shortness of Breath---Wants Prednisone  refilled Symptoms: shortness of breath Frequency: ------ Pertinent Negatives: Patient denies any change since she was seen in the office last week Disposition: [] ED /[] Urgent Care (no appt availability in office) / [] Appointment(In office/virtual)/ []  [facility]/ [] Home Care/ [x] Refused Recommended Disposition /[] [facility]/ []  Follow-up with PCP Additional Notes: Patient states that she has been out of her Prednisone  for two days. She states when she moves around her Oxygen  Level drops in the 80s.  Patient normally on 4 liters of Oxygen  at home. Patient sounds short of breath on the phone during Triage.  Patient did not want to go to the Emergency Room or come in to the office for an appointment.  This RN called the CAL to inform them of the patient's condition, request for Prednisone , and concerns for her breathing over the phone during triage.  This RN also advised the patient that if she gets worse to go to the Emergency Room if needed. Patient verbalized understanding but states that she knows her body and knows she is about the same. This RN stayed on the phone until the call was transferred to a Nurse at the Pulmonary Office    Reason for Disposition  [1] MODERATE difficulty breathing (e.g., speaks in phrases, SOB even at rest, pulse 100-120) AND [2] NEW-onset or WORSE than normal  Answer Assessment - Initial Assessment Questions 1. RESPIRATORY STATUS: "Describe your breathing?" (e.g., wheezing, shortness of breath, unable to speak, severe coughing)      ---- 2. ONSET: "When did this breathing problem begin?"      ---- 3. PATTERN "Does the difficult breathing come and go, or has it been constant since it started?"      ---- 4. SEVERITY: "How bad is your breathing?" (e.g., mild, moderate, severe)    - MILD: No SOB at rest, mild SOB with walking, speaks normally in sentences, can lie down, no  retractions, pulse < 100.    - MODERATE: SOB at rest, SOB with minimal exertion and prefers to sit, cannot lie down flat, speaks in phrases, mild retractions, audible wheezing, pulse 100-120.    - SEVERE: Very SOB at rest, speaks in single words, struggling to breathe, sitting hunched forward, retractions, pulse > 120      Patient sounds short of breath and she states that she is always short of breath 5. RECURRENT SYMPTOM: "Have you had difficulty breathing before?" If Yes, ask: "When was the last time?" and "What happened that time?"      "Always short of breath" 6. CARDIAC HISTORY: "Do you have any history of heart disease?" (e.g., heart attack, angina, bypass surgery, angioplasty)      significant 7. LUNG HISTORY: "Do you have any history of lung disease?"  (e.g., pulmonary embolus, asthma, emphysema)     significant 8. CAUSE: "What do you think is causing the breathing problem?"      ----- 9. OTHER SYMPTOMS: "Do you have any other symptoms? (e.g., dizziness, runny nose, cough, chest pain, fever)     Pt states two days without  10. O2 SATURATION MONITOR:  "Do you use an oxygen  saturation monitor (pulse oximeter) at home?" If Yes, ask: "What is your reading (oxygen  level) today?" "What is your usual oxygen  saturation reading?" (e.g., 95%)       Pt states 80s when she gets up walking around but 94% when resting  Protocols used: Breathing Difficulty-A-AH

## 2023-09-13 NOTE — Telephone Encounter (Signed)
 Copied from CRM [id number]. Topic: Clinical - Medication Refill >> Sep 13, 2023 11:04 AM Crist Dominion wrote: Medication: predniSONE  (DELTASONE ) 10 MG tablet  Has the patient contacted their pharmacy? Yes, prescription is expired.  (Agent: If no, request that the patient contact the pharmacy for the refill. If patient does not wish to contact the pharmacy document the reason why and proceed with request.) (Agent: If yes, when and what did the pharmacy advise?)  This is the patient's preferred pharmacy:  [facility] [place], [place] - [address] [place] [address] Phone: [phone] Fax: [phone]   Is this the correct pharmacy for this prescription? Yes If no, delete pharmacy and type the correct one.   Has the prescription been filled recently? Yes  Is the patient out of the medication? Yes and has been for days. Insisting this be filled today.   Has the patient been seen for an appointment in the last year OR does the patient have an upcoming appointment? Yes  Can we respond through MyChart? No  Agent: Please be advised that Rx refills may take up to 3 business days. We ask that you follow-up with your pharmacy.

## 2023-09-13 NOTE — Telephone Encounter (Signed)
 [name], I inadvertently routed this to Isa Manuel earlier, the app of the day was not updated in the chat.  I am sorry.  Please advise and send back to Triage.  Thank you.

## 2023-09-16 NOTE — Progress Notes (Signed)
 Patient started Dupixent  in office on 09/05/2023 for COPD  She is on home oxygen  4L. Unable to start Ohtuvayre  due to cost.  Continue Dupixent  300 mg every 14 days.  Next dose is due 09/19/2023 and every 14 days thereafter. Continue maintenance inhaler regimen of: Breztri  and Ohtuvayre 

## 2023-09-30 ENCOUNTER — Telehealth: Payer: Self-pay | Admitting: Cardiovascular Disease

## 2023-09-30 NOTE — Telephone Encounter (Signed)
 Patient identification verified by 2 forms. Sims Duck, RN     Patient returned called.  Scheduled for appt on 6/23 with DOD.    Patient verbalized understanding. No further questions at this time.

## 2023-09-30 NOTE — Telephone Encounter (Signed)
 Patient identification verified by 2 forms. Caitlin Duck, RN     Called and spoke to patient  Patient states:  - Having increased SOB since Saturday.  - Lung doctor put her on prednisone  and this helped for a little while with SOB. HX of COPD, asthma and emphysema .  - leg swollen on left side from knee down.  - O2 level today was 97%   Patient denies:  - abnormal symptoms other than increased SOB and fatigue               Interventions/Plan: - Patient noticeably out of breath on the phone, wheezing and grunting.  Recommended patient be evaluated at ED now. Patient declined recommendation stating I aint going to the emergency room and I will make that very clear to anyone I talk to.  She stated she has a regime already and the ED never does anything to help her.   - Asked patient when it gets bad what does she typically do at home. Patient states she may take home breathing treatment, also prescribed prednisone  10mg   therapy and dupixen shot every 2weeks and uses rescue inhaler.   - Appt available for 3:20pm with DOD Dr. Audery Blazing (per debra), however line disconnected before I could get patient scheduled. Tried calling patient back twice but no answer. Contacted emergency contact listed Caitlin Oliver (niece) and informed her of previous conversation with patient. Caitlin Oliver said she would contact patient, check on her and have her call us  back.

## 2023-09-30 NOTE — Telephone Encounter (Signed)
 Pt c/o Shortness Of Breath: STAT if SOB developed within the last 24 hours or pt is noticeably SOB on the phone  1. Are you currently SOB (can you hear that pt is SOB on the phone)?  Yes  2. How long have you been experiencing SOB?  Patient says she's always SOB, but it got worse Saturday morning.  3. Are you SOB when sitting or when up moving around?  Both   4. Are you currently experiencing any other symptoms?  Left leg swelling    Pt c/o swelling: STAT is pt has developed SOB within 24 hours  How much weight have you gained and in what time span?  No weight gain, weight remains relatively consistent  If swelling, where is the swelling located?  Left leg from the knee down   Are you currently taking a fluid pill?  2 torsemide  20 MG twice daily  Are you currently SOB?  Yes   Do you have a log of your daily weights (if so, list)?  151.6 lbs this morning   Have you gained 3 pounds in a day or 5 pounds in a week?   Have you traveled recently?    No

## 2023-10-01 ENCOUNTER — Other Ambulatory Visit: Payer: Self-pay

## 2023-10-03 ENCOUNTER — Other Ambulatory Visit: Payer: Self-pay

## 2023-10-03 ENCOUNTER — Other Ambulatory Visit: Payer: Self-pay | Admitting: Pharmacy Technician

## 2023-10-03 NOTE — Progress Notes (Signed)
 Specialty Pharmacy Refill Coordination Note  Caitlin Oliver is a 83 y.o. female contacted today regarding refills of specialty medication(s) Dupilumab  (Dupixent )   Patient requested Delivery   Delivery date: 10/15/23   Verified address: [address] [place], [place] [address]   Medication will be filled on 10/14/23.   Pt requested to deliver med inside backporch and place on chair to the left so pt can retrieve it. Noted in [place].

## 2023-10-08 ENCOUNTER — Other Ambulatory Visit (HOSPITAL_COMMUNITY): Payer: Self-pay | Admitting: Internal Medicine

## 2023-10-08 DIAGNOSIS — I5032 Chronic diastolic (congestive) heart failure: Secondary | ICD-10-CM

## 2023-10-10 ENCOUNTER — Encounter: Payer: Self-pay | Admitting: Cardiovascular Disease

## 2023-10-10 ENCOUNTER — Ambulatory Visit: Attending: Cardiovascular Disease | Admitting: Cardiovascular Disease

## 2023-10-10 VITALS — BP 104/68 | HR 78 | Ht 64.0 in | Wt 155.8 lb

## 2023-10-10 DIAGNOSIS — E782 Mixed hyperlipidemia: Secondary | ICD-10-CM | POA: Diagnosis not present

## 2023-10-10 DIAGNOSIS — I5189 Other ill-defined heart diseases: Secondary | ICD-10-CM

## 2023-10-10 DIAGNOSIS — I2510 Atherosclerotic heart disease of native coronary artery without angina pectoris: Secondary | ICD-10-CM | POA: Diagnosis not present

## 2023-10-10 DIAGNOSIS — Z720 Tobacco use: Secondary | ICD-10-CM

## 2023-10-10 DIAGNOSIS — J411 Mucopurulent chronic bronchitis: Secondary | ICD-10-CM

## 2023-10-10 DIAGNOSIS — I5032 Chronic diastolic (congestive) heart failure: Secondary | ICD-10-CM

## 2023-10-10 DIAGNOSIS — E1169 Type 2 diabetes mellitus with other specified complication: Secondary | ICD-10-CM

## 2023-10-10 DIAGNOSIS — N1832 Chronic kidney disease, stage 3b: Secondary | ICD-10-CM

## 2023-10-10 DIAGNOSIS — R600 Localized edema: Secondary | ICD-10-CM

## 2023-10-10 DIAGNOSIS — I10 Essential (primary) hypertension: Secondary | ICD-10-CM | POA: Diagnosis not present

## 2023-10-10 DIAGNOSIS — I739 Peripheral vascular disease, unspecified: Secondary | ICD-10-CM

## 2023-10-10 MED ORDER — TORSEMIDE 20 MG PO TABS: ORAL_TABLET | ORAL | 5 refills | Status: AC

## 2023-10-10 NOTE — Patient Instructions (Signed)
 Medication Instructions:  Your physician has recommended you make the following change in your medication:   -Increase torsemide  (Demadex ) to 60mg  (3 tablets) in the morning and 40mg  (2 tablets) early afternoon.  *If you need a refill on your cardiac medications before your next appointment, please call your pharmacy*  Follow-Up: At [facility], you and your health needs are our priority.  As part of our continuing mission to provide you with exceptional heart care, our providers are all part of one team.  This team includes your primary Cardiologist (physician) and Advanced Practice Providers or APPs (Physician Assistants and Nurse Practitioners) who all work together to provide you with the care you need, when you need it.  Your next appointment:   2 week(s)  Provider:   Dr. Meade  We recommend signing up for the patient portal called MyChart.  Sign up information is provided on this After Visit Summary.  MyChart is used to connect with patients for Virtual Visits (Telemedicine).  Patients are able to view lab/test results, encounter notes, upcoming appointments, etc.  Non-urgent messages can be sent to your provider as well.   To learn more about what you can do with MyChart, go to [email or web address].

## 2023-10-10 NOTE — Progress Notes (Signed)
 Cardiology Office Note    Date:  10/20/2023   ID:  [name], DOB Jan 15, 1941, MRN [id number]  PCP:  Debrah Josette ORN., PA-C  Cardiologist: Dr. Dorn Lesches  DOD add-on  History of Present Illness:  Caitlin Oliver is a 83 y.o. female who is followed by Dr. Dorn Dames for cardiology care and sees Dr. Verdon Gore for pulmonary care.  She was worked into my DOD office today with complaints of several weeks of ankle swelling.  Caitlin Oliver has a history of hypertension, hyperlipidemia, type 2 diabetes mellitus, significant remote tobacco history of greater than 75 pack years.  She has had issues with bilateral lower extremity claudication as well as high-grade bilateral internal carotid artery disease, high-grade bilateral calcified iliac artery stenosis as well as femoral artery stenoses.  She is status post staged right followed by left carotid endarterectomy by Dr. Serene.  She also has undergone diamondback orbital rotational atherectomy, PTA and iCAST covered stenting of her right common iliac artery in 2015.  She is status post left subclavian stenting by Dr. Serene and had undergone left upper lobectomy by Dr. Kerrin in 2017 due to stage Ia squamous cell CA.  She has remote history of CVA.  She has COPD and uses home oxygen .  Prior cardiac catheterization by Dr. Mady in August 2020 showed mild to moderate nonobstructive CAD.  She was recently hospitalized from April 10 through July 27, 2023 with COPD exacerbation.  She responded well to IV Solu-Medrol  and continue to be on Brovana , Pulmicort , and DuoNebs.  Recent echo Doppler study showed EF at 65 to 70% and she was discharged on prednisone  taper.  She sees Dr. Gore for pulmonary disease and saw him in follow-up of her April 2025 hospitalization on Aug 21, 2023.  She is felt to have moderate COPD with FEV1 at 62% of predicted which has worsened from prior PFTs from 2017-2020.  Adjustments were made to her medical  regimen.  Presently, she is added onto my schedule today due to ankle swelling for the last several weeks.  She is on albuterol , [place] Aerosphere, Dupixent , and recent prednisone  taper and is now on 10 mg daily.  She is on Tradjenta .  She continues to be on clopidogrel  for antiplatelet therapy.  She is on low-dose bisoprolol  2.5 mg.  She is on atorvastatin  40 mg for lipid management.  She has been taking torsemide  40 mg twice a day.  She presents for evaluation.   Past Medical History:  Diagnosis Date   Anxiety    Arthritis    Cancer (HCC)    lung cancer   Carotid artery disease (HCC)    Cataracts, bilateral    CHF (congestive heart failure) (HCC)    Claudication (HCC)    COPD (chronic obstructive pulmonary disease) (HCC)    Diabetes mellitus    Dizzy    Family history of adverse reaction to anesthesia    Pt nephew has PONV   GERD (gastroesophageal reflux disease)    Headache    History of kidney stones    Hypercholesteremia    Hypertension    Peripheral arterial disease (HCC)    bilateral iliac artery stenosis by angiography   Pneumonia    Stomach ulcer    Stroke (HCC)    Tobacco abuse    Wears glasses     Past Surgical History:  Procedure Laterality Date   ABDOMINAL AORTIC ENDOVASCULAR STENT GRAFT Right 06/19/2019   Procedure: INNOMINATE  STENT, RIGHT CAROTID STENT, RIGHT SUBCLAVIAN STENT AND CAROTID CUTDOWN;  Surgeon: Gretta Lonni PARAS, MD;  Location: [facility] OR;  Service: Vascular;  Laterality: Right;   ABDOMINAL HYSTERECTOMY     APPENDECTOMY     BREAST REDUCTION SURGERY     CAROTID ANGIOGRAM N/A 02/25/2013   Procedure: CAROTID ANGIOGRAM;  Surgeon: Dorn PARAS Lesches, MD;  Location[facility] CATH LAB;  Service: Cardiovascular;  Laterality: N/A;   ENDARTERECTOMY Right 03/06/2013   Procedure: ENDARTERECTOMY CAROTID-RIGHT;  Surgeon: Gaile LELON New, MD;  Location: [facility] OR;  Service: Vascular;  Laterality: Right;   ENDARTERECTOMY Left 05/07/2013   Procedure: LEFT CAROTID ARTERY  ENDARTERECTOMY WITH VASCU-GUARD PATCH ANGIOPLASTY ;  Surgeon: Gaile LELON New, MD;  Location: [facility] OR;  Service: Vascular;  Laterality: Left;   INTRAOPERATIVE ARTERIOGRAM N/A 06/19/2019   Procedure: Kerwin Mayers;  Surgeon: Gretta Lonni PARAS, MD;  Location: [facility] OR;  Service: Vascular;  Laterality: N/A;   IR ANGIO INTRA EXTRACRAN SEL COM CAROTID INNOMINATE UNI R MOD SED  05/14/2019   IR ANGIO VERTEBRAL SEL SUBCLAVIAN INNOMINATE BILAT MOD SED  05/14/2019   LOBECTOMY Left 02/03/2016   Procedure: LEFT UPPER LOBECTOMY;  Surgeon: Elspeth JAYSON Millers, MD;  Location: [facility] OR;  Service: Thoracic;  Laterality: Left;   LOWER EXTREMITY ANGIOGRAM N/A 02/25/2013   Procedure: LOWER EXTREMITY ANGIOGRAM;  Surgeon: Dorn PARAS Lesches, MD;  Location: [facility] CATH LAB;  Service: Cardiovascular;  Laterality: N/A;   LOWER EXTREMITY ANGIOGRAM N/A 07/27/2013   Procedure: LOWER EXTREMITY ANGIOGRAM;  Surgeon: Dorn PARAS Lesches, MD;  Location: [facility] CATH LAB;  Service: Cardiovascular;  Laterality: N/A;   PATCH ANGIOPLASTY Right 03/06/2013   Procedure: PATCH ANGIOPLASTY of Right Carotid Artery using Vascu-Guard Patch;  Surgeon: Gaile LELON New, MD;  Location: [facility] OR;  Service: Vascular;  Laterality: Right;   PERIPHERAL VASCULAR CATHETERIZATION N/A 11/15/2015   Procedure: Aortic Arch Angiography;  Surgeon: Gaile LELON New, MD;  Location: [facility] INVASIVE CV LAB;  Service: Cardiovascular;  Laterality: N/A;   PERIPHERAL VASCULAR CATHETERIZATION Bilateral 11/15/2015   Procedure: Carotid Angiography;  Surgeon: Gaile LELON New, MD;  Location: [facility] INVASIVE CV LAB;  Service: Cardiovascular;  Laterality: Bilateral;   PERIPHERAL VASCULAR CATHETERIZATION Left 11/15/2015   Procedure: Upper Extremity Angiography;  Surgeon: Gaile LELON New, MD;  Location: [facility] INVASIVE CV LAB;  Service: Cardiovascular;  Laterality: Left;   PERIPHERAL VASCULAR CATHETERIZATION Left 11/15/2015   Procedure: Peripheral Vascular Intervention;  Surgeon: Gaile LELON New, MD;  Location: [facility]  INVASIVE CV LAB;  Service: Cardiovascular;  Laterality: Left;  subclavian    RIGHT HEART CATH N/A 06/16/2019   Procedure: RIGHT HEART CATH;  Surgeon: Cherrie Toribio SAUNDERS, MD;  Location: [facility] INVASIVE CV LAB;  Service: Cardiovascular;  Laterality: N/A;   RIGHT/LEFT HEART CATH AND CORONARY ANGIOGRAPHY N/A 11/20/2018   Procedure: RIGHT/LEFT HEART CATH AND CORONARY ANGIOGRAPHY;  Surgeon: Mady Lonni, MD;  Location: [facility] INVASIVE CV LAB;  Service: Cardiovascular;  Laterality: N/A;   SALIVARY GLAND SURGERY     scar tissue removed from left saliva glad   TIBIA IM NAIL INSERTION Right 05/23/2021   Procedure: INTRAMEDULLARY (IM) NAIL TIBIAL;  Surgeon: Celena Sharper, MD;  Location: [facility] OR;  Service: Orthopedics;  Laterality: Right;   TUBAL LIGATION     ULTRASOUND GUIDANCE FOR VASCULAR ACCESS Right 06/19/2019   Procedure: Ultrasound Guidance For Vascular Access;  Surgeon: Gretta Lonni PARAS, MD;  Location: [facility] OR;  Service: Vascular;  Laterality: Right;   VIDEO ASSISTED THORACOSCOPY (VATS)/WEDGE RESECTION Left 02/03/2016   Procedure:  VIDEO ASSISTED THORACOSCOPY;  Surgeon: Elspeth JAYSON Millers, MD;  Location: [facility] OR;  Service: Thoracic;  Laterality: Left;    Current Medications: Outpatient Medications Prior to Visit  Medication Sig Dispense Refill   acetaminophen  (TYLENOL ) 500 MG tablet Take 500 mg by mouth every 6 (six) hours as needed for moderate pain.     albuterol  (PROVENTIL  HFA;VENTOLIN  HFA) 108 (90 Base) MCG/ACT inhaler Inhale 2 puffs into the lungs every 6 (six) hours as needed for wheezing or shortness of breath.      albuterol  (PROVENTIL ) (2.5 MG/3ML) 0.083% nebulizer solution Take 2.5 mg by nebulization in the morning and at bedtime.     Ascorbic Acid  (VITAMIN C) 1000 MG tablet Take 1,000 mg by mouth daily.     atorvastatin  (LIPITOR) 40 MG tablet Take 1 tablet (40 mg total) by mouth daily. 90 tablet 3   bisoprolol  (ZEBETA ) 5 MG tablet TAKE ONE-HALF TABLET BY MOUTH DAILY 45 tablet 3   BREZTRI   AEROSPHERE 160-9-4.8 MCG/ACT AERO inhaler Inhale 2 puffs into the lungs in the morning and at bedtime. 10.7 g 5   carboxymethylcellul-glycerin  (REFRESH OPTIVE) 0.5-0.9 % ophthalmic solution Place 1 drop into both eyes daily as needed for dry eyes.     Cholecalciferol  (VITAMIN D3) 50 MCG (2000 UT) capsule Take 2,000 Units by mouth daily.     clopidogrel  (PLAVIX ) 75 MG tablet Take 1 tablet (75 mg total) by mouth daily. 90 tablet 3   docusate sodium  (COLACE) 100 MG capsule Take 100 mg by mouth 2 (two) times daily as needed for mild constipation.     doxycycline  (VIBRAMYCIN ) 100 MG capsule Take 100 mg by mouth 2 (two) times daily.     Dupilumab  (DUPIXENT ) 300 MG/2ML SOAJ Inject 300 mg into the skin every 14 (fourteen) days. 4 mL 3   escitalopram  (LEXAPRO ) 10 MG tablet Take 1 tablet (10 mg total) by mouth daily. 90 tablet 3   ferrous sulfate  325 (65 FE) MG tablet Take 1 tablet (325 mg total) by mouth daily with breakfast. 90 tablet 3   Folic Acid-Vit B6-Vit B12 (FOLBEE) 2.5-25-1 MG TABS tablet Take 1 tablet by mouth daily.     HYDROcodone -acetaminophen  (NORCO/VICODIN) 5-325 MG tablet Take 1 tablet by mouth every 8 (eight) hours as needed for moderate pain (pain score 4-6) or severe pain (pain score 7-10).     linagliptin  (TRADJENTA ) 5 MG TABS tablet Take 1 tablet (5 mg total) by mouth daily. 90 tablet 3   Magnesium  250 MG TABS Take 250 mg by mouth in the morning and at bedtime.     meclizine  (ANTIVERT ) 25 MG tablet Take 1 tablet (25 mg total) by mouth every morning. 90 tablet 1   Multiple Vitamin (MULTIVITAMIN WITH MINERALS) TABS tablet Take 1 tablet by mouth daily.     naphazoline-glycerin  (CLEAR EYES REDNESS) 0.012-0.25 % SOLN 1-2 drops 4 (four) times daily as needed for eye irritation.     ondansetron  (ZOFRAN ) 4 MG tablet Take 4 mg by mouth daily as needed for nausea.     OXYGEN  Inhale 4 L/min into the lungs continuous.     pantoprazole  (PROTONIX ) 40 MG tablet Take 1 tablet (40 mg total) by mouth 2  (two) times daily. 60 tablet 0   polyethylene glycol (MIRALAX  / GLYCOLAX ) 17 g packet Take 17 g by mouth daily as needed for mild constipation.     potassium chloride  SA (KLOR-CON  M) 20 MEQ tablet TAKE ONE TABLET BY MOUTH EVERY DAY 60 tablet 3   predniSONE  (  DELTASONE ) 10 MG tablet Take 1 tablet (10 mg total) by mouth daily with breakfast. 30 tablet 5   predniSONE  (DELTASONE ) 10 MG tablet Take 1 tablet (10 mg total) by mouth daily with breakfast. 30 tablet 5   predniSONE  (DELTASONE ) 20 MG tablet Take 1 tablet (20 mg total) by mouth daily with breakfast. 5 tablet 0   senna (SENOKOT) 8.6 MG TABS tablet Take 1 tablet (8.6 mg total) by mouth daily. 120 tablet 0   torsemide  (DEMADEX ) 20 MG tablet Take 40 mg by mouth 2 (two) times daily.     No facility-administered medications prior to visit.     Allergies:   Fentanyl    Social History   Socioeconomic History   Marital status: Widowed    Spouse name: Not on file   Number of children: Not on file   Years of education: Not on file   Highest education level: Not on file  Occupational History   Not on file  Tobacco Use   Smoking status: Former    Current packs/day: 0.00    Average packs/day: 1.5 packs/day for 56.0 years (84.0 ttl pk-yrs)    Types: Cigarettes    Start date: 07/07/1957    Quit date: 07/07/2013    Years since quitting: 10.2   Smokeless tobacco: Former    Quit date: 02/25/2013  Vaping Use   Vaping status: Some Days  Substance and Sexual Activity   Alcohol use: No   Drug use: No   Sexual activity: Never  Other Topics Concern   Not on file  Social History Narrative   Not on file   Social Drivers of Health   Financial Resource Strain: Not on file  Food Insecurity: No Food Insecurity (07/26/2023)   Hunger Vital Sign    Worried About Running Out of Food in the Last Year: Never true    Ran Out of Food in the Last Year: Never true  Transportation Needs: No Transportation Needs (07/26/2023)   PRAPARE - Therapist, art (Medical): No    Lack of Transportation (Non-Medical): No  Physical Activity: Not on file  Stress: Stress Concern Present (08/18/2020)   Received from [facility] of Occupational Health - Occupational Stress Questionnaire    Feeling of Stress : To some extent  Social Connections: Socially Isolated (07/26/2023)   Social Connection and Isolation Panel    Frequency of Communication with Friends and Family: More than three times a week    Frequency of Social Gatherings with Friends and Family: Once a week    Attends Religious Services: Never    Database administrator or Organizations: No    Attends Banker Meetings: Never    Marital Status: Widowed     Family History:  The patient's family history includes Hypertension in her mother; Kidney failure in her father; Stroke in her mother.   ROS General: Negative; No fevers, chills, or night sweats;  HEENT: Negative; No changes in vision or hearing, sinus congestion, difficulty swallowing Pulmonary: Negative; No cough, wheezing, shortness of breath, hemoptysis Cardiovascular: Negative; No chest pain, presyncope, syncope, palpitations GI: Negative; No nausea, vomiting, diarrhea, or abdominal pain GU: Negative; No dysuria, hematuria, or difficulty voiding Musculoskeletal: Negative; no myalgias, joint pain, or weakness Hematologic/Oncology: Negative; no easy bruising, bleeding Endocrine: Negative; no heat/cold intolerance; no diabetes Neuro: Negative; no changes in balance, headaches Skin: Negative; No rashes or skin lesions Psychiatric: Negative; No behavioral problems, depression Sleep: Negative; No  snoring, daytime sleepiness, hypersomnolence, bruxism, restless legs, hypnogognic hallucinations, no cataplexy Other comprehensive 14 point system review is negative.   PHYSICAL EXAM:   VS:  BP 104/68   Pulse 78   Ht 5' 4 (1.626 m)   Wt 155 lb 12.8 oz (70.7 kg)   SpO2 91% Comment:  after resting for several minutes  BMI 26.74 kg/m     Repeat blood pressure by me was 108/68 O2 saturation at 85%  Wt Readings from Last 3 Encounters:  10/10/23 155 lb 12.8 oz (70.7 kg)  08/21/23 154 lb (69.9 kg)  07/26/23 157 lb 10.1 oz (71.5 kg)    General: Alert, oriented, no distress.  Skin: normal turgor, no rashes, warm and dry HEENT: Normocephalic, atraumatic. Pupils equal round and reactive to light; sclera anicteric; extraocular muscles intact;  Nose without nasal septal hypertrophy Mouth/Parynx benign; Mallinpatti scale Neck: No JVD, no carotid bruits; normal carotid upstroke Lungs: Decreased breath sounds diffusely.  No significant wheezing Chest wall: without tenderness to palpitation Heart: PMI not displaced, RRR, s1 s2 normal, 1/6 systolic murmur, no diastolic murmur, no rubs, gallops, thrills, or heaves Abdomen: soft, nontender; no hepatosplenomehaly, BS+; abdominal aorta nontender and not dilated by palpation. Back: no CVA tenderness Pulses 2+ Musculoskeletal: full range of motion, normal strength, no joint deformities Extremities: Bilateral 1-2+ lower extremity edema, Homan's sign negative  Neurologic: grossly nonfocal; Cranial nerves grossly wnl Psychologic: Normal mood and affect   Studies/Labs Reviewed:   EKG Interpretation Date/Time:  Thursday October 10 2023 10:12:16 EDT Ventricular Rate:  78 PR Interval:  142 QRS Duration:  72 QT Interval:  402 QTC Calculation: 458 R Axis:   47  Text Interpretation: Normal sinus rhythm Possible Left atrial enlargement When compared with ECG of 25-Jul-2023 14:02, PREVIOUS ECG IS PRESENT Confirmed by Burnard Ned ([id number]) on 10/20/2023 2:09:15 PM    Recent Labs:    Latest Ref Rng & Units 07/26/2023    2:10 AM 07/25/2023    1:39 PM 06/30/2023   11:47 AM  BMP  Glucose 70 - 99 mg/dL 626  892  818   BUN 8 - 23 mg/dL 26  26  33   Creatinine 0.44 - 1.00 mg/dL 8.41  8.63  8.52   Sodium 135 - 145 mmol/L 136  135  132    Potassium 3.5 - 5.1 mmol/L 4.2  3.9  4.1   Chloride 98 - 111 mmol/L 99  97  96   CO2 22 - 32 mmol/L 25  25  27    Calcium  8.9 - 10.3 mg/dL 9.1  8.6  8.9         Latest Ref Rng & Units 07/25/2023    1:39 PM 05/30/2023    3:38 PM 05/12/2021    1:03 PM  Hepatic Function  Total Protein 6.5 - 8.1 g/dL 6.4  6.2  6.5   Albumin  3.5 - 5.0 g/dL 3.7  3.5  3.8   AST 15 - 41 U/L 23  26  20    ALT 0 - 44 U/L 18  18  16    Alk Phosphatase 38 - 126 U/L 78  62  58   Total Bilirubin 0.0 - 1.2 mg/dL 0.6  0.5  0.4        Latest Ref Rng & Units 07/26/2023    2:10 AM 07/25/2023    1:39 PM 06/30/2023   11:47 AM  CBC  WBC 4.0 - 10.5 K/uL 10.0  10.4  9.6   Hemoglobin 12.0 - 15.0  g/dL 87.8  87.6  87.6   Hematocrit 36.0 - 46.0 % 37.1  37.0  37.4   Platelets 150 - 400 K/uL 259  264  258    Lab Results  Component Value Date   MCV 90.5 07/26/2023   MCV 90.2 07/25/2023   MCV 89.7 06/30/2023   Lab Results  Component Value Date   TSH 1.376 07/26/2023   Lab Results  Component Value Date   HGBA1C 7.9 (H) 07/26/2023     BNP    Component Value Date/Time   BNP 127.3 (H) 07/25/2023 1339    ProBNP    Component Value Date/Time   PROBNP 1,147.0 (H) 11/13/2018 1434     Lipid Panel     Component Value Date/Time   CHOL 135 05/17/2021 0231   TRIG 185 (H) 05/17/2021 0231   HDL 31 (L) 05/17/2021 0231   CHOLHDL 4.4 05/17/2021 0231   VLDL 37 05/17/2021 0231   LDLCALC 67 05/17/2021 0231     RADIOLOGY: No results found.   Additional studies/ records that were reviewed today include:  I have reviewed records of Dr. Court, Dr. Meade, Lum Louis, NP, patient's recent hospitalization, noninvasive assessments and recent hospitalization.   ASSESSMENT:    1. Essential hypertension   2. Bilateral lower extremity edema   3. Mixed hyperlipidemia   4. Coronary artery disease involving native coronary artery of native heart without angina pectoris   5. Stage 3b chronic kidney disease (CKD)  (HCC)   6. PVD (peripheral vascular disease) with claudication (HCC)   7. Grade I diastolic dysfunction   8. COPD   9. Type 2 diabetes mellitus with other specified complication, without long-term current use of insulin  (HCC)   10. Tobacco abuse: Greater than 75 pack years, quit 2015     PLAN:  Caitlin Oliver is an 83 year old female with prior extensive tobacco history greater than 75 years who has a history of hypertension, hyperlipidemia, type 2 diabetes mellitus, significant peripheral vascular disease, status post stenting of lower extremity as well as bilateral carotid stenting.  She also is status post left upper lobectomy for stage Ia squamous cell CA.  She has COPD and is oxygen  dependent.  Previous cardiac catheterization showed mild to moderate nonobstructive CAD.  She was recently hospitalized with COPD exacerbation requiring IV Solu-Medrol , prednisone  taper, and adjustments in pulmonary medications.  Over the last several weeks she has experienced progressive swelling of her ankles bilaterally.  She has been taking torsemide  40 mg twice a day.  I am recommending she increase the morning dose to 60 mg and continue to take 40 mg at 4 PM.  I also strongly recommended support stockings.  If edema persists subsequent dose titration will be necessary.  Recent laboratory from April 2025 was reviewed and creatinine was 1.58 with potassium 4.2.  Her most recent echo Doppler study from April 2025 shows hyperdynamic LV function with EF 65 to 70%, grade 1 diastolic dysfunction, with moderate reduction in RV systolic function and mildly elevated pulmonary artery systolic pressure at 41.2 mm.  There was borderline dilatation of her aortic root at 39 mm.  She is scheduled to undergo aortic, IVC, and iliac Doppler studies in addition to carotid Dopplers on November 06, 2023.  She is scheduled to see Dr. Meade for pulmonary follow-up in 2 weeks. I have recommended she also follow-up with Dr. Wadie in 2 to 3  months or sooner as needed.   Medication Adjustments/Labs and Tests Ordered: Current medicines are  reviewed at length with the patient today.  Concerns regarding medicines are outlined above.  Medication changes, Labs and Tests ordered today are listed in the Patient Instructions below. Patient Instructions  Medication Instructions:  Your physician has recommended you make the following change in your medication:   -Increase torsemide  (Demadex ) to 60mg  (3 tablets) in the morning and 40mg  (2 tablets) early afternoon.  *If you need a refill on your cardiac medications before your next appointment, please call your pharmacy*  Follow-Up: At [facility], you and your health needs are our priority.  As part of our continuing mission to provide you with exceptional heart care, our providers are all part of one team.  This team includes your primary Cardiologist (physician) and Advanced Practice Providers or APPs (Physician Assistants and Nurse Practitioners) who all work together to provide you with the care you need, when you need it.  Your next appointment:   2 week(s)  Provider:   Dr. Meade  We recommend signing up for the patient portal called MyChart.  Sign up information is provided on this After Visit Summary.  MyChart is used to connect with patients for Virtual Visits (Telemedicine).  Patients are able to view lab/test results, encounter notes, upcoming appointments, etc.  Non-urgent messages can be sent to your provider as well.   To learn more about what you can do with MyChart, go to [email or web address].    Signed, Debby Sor, MD  10/20/2023 2:18 PM    [facility] [address], [place], [place]  [address] Phone: [phone]

## 2023-10-12 ENCOUNTER — Encounter (HOSPITAL_COMMUNITY): Payer: Self-pay | Admitting: Interventional Radiology

## 2023-10-14 ENCOUNTER — Other Ambulatory Visit: Payer: Self-pay

## 2023-10-20 ENCOUNTER — Encounter: Payer: Self-pay | Admitting: Cardiovascular Disease

## 2023-10-24 ENCOUNTER — Telehealth (HOSPITAL_COMMUNITY): Payer: Self-pay | Admitting: Internal Medicine

## 2023-11-06 ENCOUNTER — Ambulatory Visit (HOSPITAL_COMMUNITY)

## 2023-11-06 ENCOUNTER — Other Ambulatory Visit: Payer: Self-pay

## 2023-11-06 NOTE — Addendum Note (Signed)
 Addended by: DAYNE SHERRY RAMAN on: 11/06/2023 10:03 AM   Modules accepted: Level of Service

## 2023-11-06 NOTE — Progress Notes (Signed)
 Specialty Pharmacy Refill Coordination Note  Caitlin Oliver is a 83 y.o. female contacted today regarding refills of specialty medication(s) Dupilumab  (Dupixent )   Patient requested Delivery   Delivery date: 11/08/23   Verified address: [address] [place], [place] [address]   Medication will be filled on 11/07/23.

## 2023-11-07 ENCOUNTER — Other Ambulatory Visit: Payer: Self-pay

## 2023-11-28 ENCOUNTER — Other Ambulatory Visit: Payer: Self-pay

## 2023-12-02 ENCOUNTER — Other Ambulatory Visit: Payer: Self-pay
